# Patient Record
Sex: Male | Born: 1970 | Race: White | Hispanic: No | Marital: Single | Smoking: Current every day smoker
Health system: Southern US, Community
[De-identification: ages and names within clinical notes are randomized; demographics above are authoritative.]

## PROBLEM LIST (undated history)

## (undated) DIAGNOSIS — E119 Type 2 diabetes mellitus without complications: Secondary | ICD-10-CM

## (undated) DIAGNOSIS — I10 Essential (primary) hypertension: Secondary | ICD-10-CM

## (undated) DIAGNOSIS — J45909 Unspecified asthma, uncomplicated: Secondary | ICD-10-CM

## (undated) DIAGNOSIS — I4891 Unspecified atrial fibrillation: Secondary | ICD-10-CM

## (undated) DIAGNOSIS — Z9889 Other specified postprocedural states: Secondary | ICD-10-CM

## (undated) DIAGNOSIS — Z9289 Personal history of other medical treatment: Secondary | ICD-10-CM

## (undated) HISTORY — PX: LAPAROSCOPIC GASTRIC SLEEVE RESECTION: SHX5895

## (undated) HISTORY — PX: ABDOMINAL SURGERY: SHX537

---

## 1998-07-29 ENCOUNTER — Emergency Department (HOSPITAL_COMMUNITY): Admission: EM | Admit: 1998-07-29 | Discharge: 1998-07-29 | Payer: Self-pay

## 1999-11-24 ENCOUNTER — Ambulatory Visit (HOSPITAL_COMMUNITY): Admission: RE | Admit: 1999-11-24 | Discharge: 1999-11-24 | Payer: Self-pay | Admitting: Internal Medicine

## 1999-11-24 ENCOUNTER — Encounter: Payer: Self-pay | Admitting: Internal Medicine

## 2001-04-24 ENCOUNTER — Ambulatory Visit (HOSPITAL_BASED_OUTPATIENT_CLINIC_OR_DEPARTMENT_OTHER): Admission: RE | Admit: 2001-04-24 | Discharge: 2001-04-25 | Payer: Self-pay

## 2002-05-27 ENCOUNTER — Observation Stay (HOSPITAL_COMMUNITY): Admission: EM | Admit: 2002-05-27 | Discharge: 2002-05-28 | Payer: Self-pay | Admitting: Endocrinology

## 2002-05-28 ENCOUNTER — Encounter: Payer: Self-pay | Admitting: Endocrinology

## 2003-05-30 ENCOUNTER — Emergency Department (HOSPITAL_COMMUNITY): Admission: EM | Admit: 2003-05-30 | Discharge: 2003-05-30 | Payer: Self-pay | Admitting: Emergency Medicine

## 2003-10-31 ENCOUNTER — Inpatient Hospital Stay (HOSPITAL_COMMUNITY): Admission: EM | Admit: 2003-10-31 | Discharge: 2003-11-01 | Payer: Self-pay | Admitting: Emergency Medicine

## 2004-12-29 ENCOUNTER — Emergency Department (HOSPITAL_COMMUNITY): Admission: EM | Admit: 2004-12-29 | Discharge: 2004-12-30 | Payer: Self-pay | Admitting: Emergency Medicine

## 2006-02-04 ENCOUNTER — Emergency Department (HOSPITAL_COMMUNITY): Admission: EM | Admit: 2006-02-04 | Discharge: 2006-02-04 | Payer: Self-pay | Admitting: Emergency Medicine

## 2014-05-16 DIAGNOSIS — G4733 Obstructive sleep apnea (adult) (pediatric): Secondary | ICD-10-CM | POA: Diagnosis present

## 2015-05-21 DIAGNOSIS — Z9884 Bariatric surgery status: Secondary | ICD-10-CM | POA: Insufficient documentation

## 2015-07-15 DIAGNOSIS — J4520 Mild intermittent asthma, uncomplicated: Secondary | ICD-10-CM | POA: Diagnosis present

## 2019-09-14 DIAGNOSIS — I890 Lymphedema, not elsewhere classified: Secondary | ICD-10-CM | POA: Insufficient documentation

## 2020-04-09 DIAGNOSIS — I872 Venous insufficiency (chronic) (peripheral): Secondary | ICD-10-CM | POA: Insufficient documentation

## 2020-06-10 ENCOUNTER — Inpatient Hospital Stay (HOSPITAL_COMMUNITY): Payer: Medicaid Other

## 2020-06-10 ENCOUNTER — Inpatient Hospital Stay (HOSPITAL_COMMUNITY): Payer: Medicaid Other | Admitting: Anesthesiology

## 2020-06-10 ENCOUNTER — Emergency Department (HOSPITAL_COMMUNITY): Payer: Medicaid Other

## 2020-06-10 ENCOUNTER — Encounter (HOSPITAL_COMMUNITY): Payer: Self-pay

## 2020-06-10 ENCOUNTER — Inpatient Hospital Stay (HOSPITAL_COMMUNITY)
Admission: EM | Admit: 2020-06-10 | Discharge: 2020-08-06 | DRG: 003 | Disposition: A | Discharge status: Rehabilitation Facility | Payer: Medicaid Other | Attending: Family Medicine | Admitting: Family Medicine

## 2020-06-10 ENCOUNTER — Encounter (HOSPITAL_COMMUNITY)
Admission: EM | Disposition: A | Discharge status: Rehabilitation Facility | Payer: Self-pay | Source: Home / Self Care | Attending: Pulmonary Disease

## 2020-06-10 ENCOUNTER — Other Ambulatory Visit: Payer: Self-pay

## 2020-06-10 DIAGNOSIS — E8881 Metabolic syndrome: Secondary | ICD-10-CM | POA: Diagnosis present

## 2020-06-10 DIAGNOSIS — Z430 Encounter for attention to tracheostomy: Secondary | ICD-10-CM

## 2020-06-10 DIAGNOSIS — Z20822 Contact with and (suspected) exposure to covid-19: Secondary | ICD-10-CM | POA: Diagnosis present

## 2020-06-10 DIAGNOSIS — Y95 Nosocomial condition: Secondary | ICD-10-CM | POA: Diagnosis present

## 2020-06-10 DIAGNOSIS — Z79890 Hormone replacement therapy (postmenopausal): Secondary | ICD-10-CM

## 2020-06-10 DIAGNOSIS — G894 Chronic pain syndrome: Secondary | ICD-10-CM | POA: Diagnosis not present

## 2020-06-10 DIAGNOSIS — J189 Pneumonia, unspecified organism: Secondary | ICD-10-CM | POA: Diagnosis not present

## 2020-06-10 DIAGNOSIS — L0231 Cutaneous abscess of buttock: Secondary | ICD-10-CM | POA: Diagnosis present

## 2020-06-10 DIAGNOSIS — D6859 Other primary thrombophilia: Secondary | ICD-10-CM | POA: Diagnosis present

## 2020-06-10 DIAGNOSIS — A429 Actinomycosis, unspecified: Secondary | ICD-10-CM

## 2020-06-10 DIAGNOSIS — I878 Other specified disorders of veins: Secondary | ICD-10-CM | POA: Diagnosis present

## 2020-06-10 DIAGNOSIS — J9690 Respiratory failure, unspecified, unspecified whether with hypoxia or hypercapnia: Secondary | ICD-10-CM

## 2020-06-10 DIAGNOSIS — Z888 Allergy status to other drugs, medicaments and biological substances status: Secondary | ICD-10-CM

## 2020-06-10 DIAGNOSIS — L97409 Non-pressure chronic ulcer of unspecified heel and midfoot with unspecified severity: Secondary | ICD-10-CM | POA: Diagnosis present

## 2020-06-10 DIAGNOSIS — Z79899 Other long term (current) drug therapy: Secondary | ICD-10-CM

## 2020-06-10 DIAGNOSIS — E1110 Type 2 diabetes mellitus with ketoacidosis without coma: Secondary | ICD-10-CM | POA: Diagnosis present

## 2020-06-10 DIAGNOSIS — G8929 Other chronic pain: Secondary | ICD-10-CM | POA: Diagnosis not present

## 2020-06-10 DIAGNOSIS — I10 Essential (primary) hypertension: Secondary | ICD-10-CM | POA: Diagnosis present

## 2020-06-10 DIAGNOSIS — R531 Weakness: Secondary | ICD-10-CM | POA: Diagnosis present

## 2020-06-10 DIAGNOSIS — E08621 Diabetes mellitus due to underlying condition with foot ulcer: Secondary | ICD-10-CM | POA: Diagnosis not present

## 2020-06-10 DIAGNOSIS — N179 Acute kidney failure, unspecified: Secondary | ICD-10-CM | POA: Diagnosis present

## 2020-06-10 DIAGNOSIS — M2351 Chronic instability of knee, right knee: Secondary | ICD-10-CM | POA: Diagnosis not present

## 2020-06-10 DIAGNOSIS — R0902 Hypoxemia: Secondary | ICD-10-CM

## 2020-06-10 DIAGNOSIS — Z452 Encounter for adjustment and management of vascular access device: Secondary | ICD-10-CM

## 2020-06-10 DIAGNOSIS — K567 Ileus, unspecified: Secondary | ICD-10-CM | POA: Diagnosis not present

## 2020-06-10 DIAGNOSIS — X58XXXA Exposure to other specified factors, initial encounter: Secondary | ICD-10-CM | POA: Diagnosis not present

## 2020-06-10 DIAGNOSIS — N493 Fournier gangrene: Secondary | ICD-10-CM | POA: Diagnosis not present

## 2020-06-10 DIAGNOSIS — J9600 Acute respiratory failure, unspecified whether with hypoxia or hypercapnia: Secondary | ICD-10-CM

## 2020-06-10 DIAGNOSIS — E871 Hypo-osmolality and hyponatremia: Secondary | ICD-10-CM | POA: Diagnosis present

## 2020-06-10 DIAGNOSIS — Z6841 Body Mass Index (BMI) 40.0 and over, adult: Secondary | ICD-10-CM

## 2020-06-10 DIAGNOSIS — R109 Unspecified abdominal pain: Secondary | ICD-10-CM

## 2020-06-10 DIAGNOSIS — J9621 Acute and chronic respiratory failure with hypoxia: Secondary | ICD-10-CM | POA: Diagnosis present

## 2020-06-10 DIAGNOSIS — J9601 Acute respiratory failure with hypoxia: Secondary | ICD-10-CM | POA: Diagnosis not present

## 2020-06-10 DIAGNOSIS — E870 Hyperosmolality and hypernatremia: Secondary | ICD-10-CM | POA: Diagnosis not present

## 2020-06-10 DIAGNOSIS — E039 Hypothyroidism, unspecified: Secondary | ICD-10-CM | POA: Diagnosis not present

## 2020-06-10 DIAGNOSIS — Z23 Encounter for immunization: Secondary | ICD-10-CM | POA: Diagnosis not present

## 2020-06-10 DIAGNOSIS — E11628 Type 2 diabetes mellitus with other skin complications: Secondary | ICD-10-CM | POA: Diagnosis not present

## 2020-06-10 DIAGNOSIS — J9819 Other pulmonary collapse: Secondary | ICD-10-CM | POA: Diagnosis not present

## 2020-06-10 DIAGNOSIS — A401 Sepsis due to streptococcus, group B: Secondary | ICD-10-CM | POA: Diagnosis present

## 2020-06-10 DIAGNOSIS — Z885 Allergy status to narcotic agent status: Secondary | ICD-10-CM

## 2020-06-10 DIAGNOSIS — R6521 Severe sepsis with septic shock: Secondary | ICD-10-CM | POA: Diagnosis present

## 2020-06-10 DIAGNOSIS — E662 Morbid (severe) obesity with alveolar hypoventilation: Secondary | ICD-10-CM | POA: Diagnosis present

## 2020-06-10 DIAGNOSIS — K5641 Fecal impaction: Secondary | ICD-10-CM | POA: Diagnosis present

## 2020-06-10 DIAGNOSIS — T148XXA Other injury of unspecified body region, initial encounter: Secondary | ICD-10-CM | POA: Diagnosis not present

## 2020-06-10 DIAGNOSIS — B9620 Unspecified Escherichia coli [E. coli] as the cause of diseases classified elsewhere: Secondary | ICD-10-CM | POA: Diagnosis present

## 2020-06-10 DIAGNOSIS — I462 Cardiac arrest due to underlying cardiac condition: Secondary | ICD-10-CM | POA: Diagnosis not present

## 2020-06-10 DIAGNOSIS — T8143XA Infection following a procedure, organ and space surgical site, initial encounter: Secondary | ICD-10-CM

## 2020-06-10 DIAGNOSIS — T17890A Other foreign object in other parts of respiratory tract causing asphyxiation, initial encounter: Secondary | ICD-10-CM | POA: Diagnosis not present

## 2020-06-10 DIAGNOSIS — Z01818 Encounter for other preprocedural examination: Secondary | ICD-10-CM

## 2020-06-10 DIAGNOSIS — R7309 Other abnormal glucose: Secondary | ICD-10-CM | POA: Diagnosis not present

## 2020-06-10 DIAGNOSIS — I482 Chronic atrial fibrillation, unspecified: Secondary | ICD-10-CM | POA: Diagnosis present

## 2020-06-10 DIAGNOSIS — E876 Hypokalemia: Secondary | ICD-10-CM | POA: Diagnosis not present

## 2020-06-10 DIAGNOSIS — M726 Necrotizing fasciitis: Secondary | ICD-10-CM | POA: Diagnosis present

## 2020-06-10 DIAGNOSIS — R339 Retention of urine, unspecified: Secondary | ICD-10-CM | POA: Diagnosis not present

## 2020-06-10 DIAGNOSIS — S81811A Laceration without foreign body, right lower leg, initial encounter: Secondary | ICD-10-CM | POA: Diagnosis present

## 2020-06-10 DIAGNOSIS — N170 Acute kidney failure with tubular necrosis: Secondary | ICD-10-CM | POA: Diagnosis present

## 2020-06-10 DIAGNOSIS — D62 Acute posthemorrhagic anemia: Secondary | ICD-10-CM | POA: Diagnosis not present

## 2020-06-10 DIAGNOSIS — R5381 Other malaise: Secondary | ICD-10-CM | POA: Diagnosis not present

## 2020-06-10 DIAGNOSIS — J9809 Other diseases of bronchus, not elsewhere classified: Secondary | ICD-10-CM | POA: Diagnosis not present

## 2020-06-10 DIAGNOSIS — A419 Sepsis, unspecified organism: Secondary | ICD-10-CM

## 2020-06-10 DIAGNOSIS — I472 Ventricular tachycardia: Secondary | ICD-10-CM | POA: Diagnosis not present

## 2020-06-10 DIAGNOSIS — Z91030 Bee allergy status: Secondary | ICD-10-CM

## 2020-06-10 DIAGNOSIS — T502X5A Adverse effect of carbonic-anhydrase inhibitors, benzothiadiazides and other diuretics, initial encounter: Secondary | ICD-10-CM | POA: Diagnosis not present

## 2020-06-10 DIAGNOSIS — L97419 Non-pressure chronic ulcer of right heel and midfoot with unspecified severity: Secondary | ICD-10-CM | POA: Diagnosis present

## 2020-06-10 DIAGNOSIS — M533 Sacrococcygeal disorders, not elsewhere classified: Secondary | ICD-10-CM | POA: Diagnosis not present

## 2020-06-10 DIAGNOSIS — K5901 Slow transit constipation: Secondary | ICD-10-CM | POA: Diagnosis not present

## 2020-06-10 DIAGNOSIS — K7200 Acute and subacute hepatic failure without coma: Secondary | ICD-10-CM | POA: Diagnosis present

## 2020-06-10 DIAGNOSIS — E8770 Fluid overload, unspecified: Secondary | ICD-10-CM | POA: Diagnosis not present

## 2020-06-10 DIAGNOSIS — J159 Unspecified bacterial pneumonia: Secondary | ICD-10-CM | POA: Diagnosis present

## 2020-06-10 DIAGNOSIS — Z9884 Bariatric surgery status: Secondary | ICD-10-CM

## 2020-06-10 DIAGNOSIS — L97529 Non-pressure chronic ulcer of other part of left foot with unspecified severity: Secondary | ICD-10-CM | POA: Diagnosis present

## 2020-06-10 DIAGNOSIS — J9602 Acute respiratory failure with hypercapnia: Secondary | ICD-10-CM | POA: Diagnosis not present

## 2020-06-10 DIAGNOSIS — Y838 Other surgical procedures as the cause of abnormal reaction of the patient, or of later complication, without mention of misadventure at the time of the procedure: Secondary | ICD-10-CM | POA: Diagnosis not present

## 2020-06-10 DIAGNOSIS — G928 Other toxic encephalopathy: Secondary | ICD-10-CM | POA: Diagnosis present

## 2020-06-10 DIAGNOSIS — S81812A Laceration without foreign body, left lower leg, initial encounter: Secondary | ICD-10-CM | POA: Diagnosis present

## 2020-06-10 DIAGNOSIS — T17500A Unspecified foreign body in bronchus causing asphyxiation, initial encounter: Secondary | ICD-10-CM | POA: Diagnosis not present

## 2020-06-10 DIAGNOSIS — Z880 Allergy status to penicillin: Secondary | ICD-10-CM

## 2020-06-10 DIAGNOSIS — M62838 Other muscle spasm: Secondary | ICD-10-CM | POA: Diagnosis not present

## 2020-06-10 DIAGNOSIS — R1310 Dysphagia, unspecified: Secondary | ICD-10-CM | POA: Diagnosis present

## 2020-06-10 DIAGNOSIS — Z8719 Personal history of other diseases of the digestive system: Secondary | ICD-10-CM | POA: Diagnosis not present

## 2020-06-10 DIAGNOSIS — E785 Hyperlipidemia, unspecified: Secondary | ICD-10-CM | POA: Diagnosis present

## 2020-06-10 DIAGNOSIS — Z930 Tracheostomy status: Secondary | ICD-10-CM | POA: Diagnosis not present

## 2020-06-10 DIAGNOSIS — R739 Hyperglycemia, unspecified: Secondary | ICD-10-CM

## 2020-06-10 DIAGNOSIS — T402X5A Adverse effect of other opioids, initial encounter: Secondary | ICD-10-CM | POA: Diagnosis present

## 2020-06-10 DIAGNOSIS — Z4659 Encounter for fitting and adjustment of other gastrointestinal appliance and device: Secondary | ICD-10-CM

## 2020-06-10 DIAGNOSIS — J45909 Unspecified asthma, uncomplicated: Secondary | ICD-10-CM | POA: Diagnosis present

## 2020-06-10 DIAGNOSIS — I480 Paroxysmal atrial fibrillation: Secondary | ICD-10-CM | POA: Diagnosis present

## 2020-06-10 DIAGNOSIS — J9503 Malfunction of tracheostomy stoma: Secondary | ICD-10-CM | POA: Diagnosis not present

## 2020-06-10 DIAGNOSIS — E1142 Type 2 diabetes mellitus with diabetic polyneuropathy: Secondary | ICD-10-CM | POA: Diagnosis present

## 2020-06-10 DIAGNOSIS — D6489 Other specified anemias: Secondary | ICD-10-CM | POA: Diagnosis present

## 2020-06-10 DIAGNOSIS — E11621 Type 2 diabetes mellitus with foot ulcer: Secondary | ICD-10-CM | POA: Diagnosis present

## 2020-06-10 DIAGNOSIS — Z781 Physical restraint status: Secondary | ICD-10-CM

## 2020-06-10 DIAGNOSIS — K5900 Constipation, unspecified: Secondary | ICD-10-CM

## 2020-06-10 DIAGNOSIS — L03317 Cellulitis of buttock: Secondary | ICD-10-CM | POA: Diagnosis present

## 2020-06-10 DIAGNOSIS — F17210 Nicotine dependence, cigarettes, uncomplicated: Secondary | ICD-10-CM | POA: Diagnosis present

## 2020-06-10 DIAGNOSIS — Z7901 Long term (current) use of anticoagulants: Secondary | ICD-10-CM

## 2020-06-10 DIAGNOSIS — J9622 Acute and chronic respiratory failure with hypercapnia: Secondary | ICD-10-CM | POA: Diagnosis present

## 2020-06-10 DIAGNOSIS — Z7984 Long term (current) use of oral hypoglycemic drugs: Secondary | ICD-10-CM

## 2020-06-10 DIAGNOSIS — Z0189 Encounter for other specified special examinations: Secondary | ICD-10-CM

## 2020-06-10 DIAGNOSIS — Z9911 Dependence on respirator [ventilator] status: Secondary | ICD-10-CM | POA: Diagnosis not present

## 2020-06-10 DIAGNOSIS — Z833 Family history of diabetes mellitus: Secondary | ICD-10-CM

## 2020-06-10 HISTORY — DX: Other specified postprocedural states: Z98.890

## 2020-06-10 HISTORY — DX: Essential (primary) hypertension: I10

## 2020-06-10 HISTORY — PX: IRRIGATION AND DEBRIDEMENT ABSCESS: SHX5252

## 2020-06-10 HISTORY — DX: Type 2 diabetes mellitus without complications: E11.9

## 2020-06-10 HISTORY — DX: Unspecified asthma, uncomplicated: J45.909

## 2020-06-10 HISTORY — DX: Personal history of other medical treatment: Z92.89

## 2020-06-10 HISTORY — DX: Fournier gangrene: N49.3

## 2020-06-10 HISTORY — DX: Unspecified atrial fibrillation: I48.91

## 2020-06-10 LAB — COMPREHENSIVE METABOLIC PANEL
ALT: 23 U/L (ref 0–44)
AST: 25 U/L (ref 15–41)
Albumin: 2 g/dL — ABNORMAL LOW (ref 3.5–5.0)
Alkaline Phosphatase: 87 U/L (ref 38–126)
Anion gap: 16 — ABNORMAL HIGH (ref 5–15)
BUN: 36 mg/dL — ABNORMAL HIGH (ref 6–20)
CO2: 18 mmol/L — ABNORMAL LOW (ref 22–32)
Calcium: 8.4 mg/dL — ABNORMAL LOW (ref 8.9–10.3)
Chloride: 94 mmol/L — ABNORMAL LOW (ref 98–111)
Creatinine, Ser: 3.56 mg/dL — ABNORMAL HIGH (ref 0.61–1.24)
GFR, Estimated: 20 mL/min — ABNORMAL LOW (ref >60)
Glucose, Bld: 362 mg/dL — ABNORMAL HIGH (ref 70–99)
Potassium: 4.3 mmol/L (ref 3.5–5.1)
Sodium: 128 mmol/L — ABNORMAL LOW (ref 135–145)
Total Bilirubin: 1.3 mg/dL — ABNORMAL HIGH (ref 0.3–1.2)
Total Protein: 6.3 g/dL — ABNORMAL LOW (ref 6.5–8.1)

## 2020-06-10 LAB — POCT I-STAT 7, (LYTES, BLD GAS, ICA,H+H)
Acid-base deficit: 11 mmol/L — ABNORMAL HIGH (ref 0.0–2.0)
Acid-base deficit: 8 mmol/L — ABNORMAL HIGH (ref 0.0–2.0)
Acid-base deficit: 12 mmol/L — ABNORMAL HIGH (ref 0.0–2.0)
Bicarbonate: 18.8 mmol/L — ABNORMAL LOW (ref 20.0–28.0)
Bicarbonate: 21.6 mmol/L (ref 20.0–28.0)
Bicarbonate: 16.6 mmol/L — ABNORMAL LOW (ref 20.0–28.0)
Calcium, Ion: 1.11 mmol/L — ABNORMAL LOW (ref 1.15–1.40)
Calcium, Ion: 1.05 mmol/L — ABNORMAL LOW (ref 1.15–1.40)
Calcium, Ion: 1.03 mmol/L — ABNORMAL LOW (ref 1.15–1.40)
HCT: 33 % — ABNORMAL LOW (ref 39.0–52.0)
HCT: 36 % — ABNORMAL LOW (ref 39.0–52.0)
HCT: 48 % (ref 39.0–52.0)
Hemoglobin: 11.2 g/dL — ABNORMAL LOW (ref 13.0–17.0)
Hemoglobin: 12.2 g/dL — ABNORMAL LOW (ref 13.0–17.0)
Hemoglobin: 16.3 g/dL (ref 13.0–17.0)
O2 Saturation: 79 %
O2 Saturation: 92 %
O2 Saturation: 94 %
Patient temperature: 98.6
Patient temperature: 100.1
Potassium: 5.4 mmol/L — ABNORMAL HIGH (ref 3.5–5.1)
Potassium: 5.6 mmol/L — ABNORMAL HIGH (ref 3.5–5.1)
Potassium: 6 mmol/L — ABNORMAL HIGH (ref 3.5–5.1)
Sodium: 127 mmol/L — ABNORMAL LOW (ref 135–145)
Sodium: 131 mmol/L — ABNORMAL LOW (ref 135–145)
Sodium: 129 mmol/L — ABNORMAL LOW (ref 135–145)
TCO2: 20 mmol/L — ABNORMAL LOW (ref 22–32)
TCO2: 24 mmol/L (ref 22–32)
TCO2: 18 mmol/L — ABNORMAL LOW (ref 22–32)
pCO2 arterial: 57.6 mmHg — ABNORMAL HIGH (ref 32.0–48.0)
pCO2 arterial: 66 mmHg (ref 32.0–48.0)
pCO2 arterial: 49.4 mmHg — ABNORMAL HIGH (ref 32.0–48.0)
pH, Arterial: 7.121 — CL (ref 7.350–7.450)
pH, Arterial: 7.124 — CL (ref 7.350–7.450)
pH, Arterial: 7.14 — CL (ref 7.350–7.450)
pO2, Arterial: 58 mmHg — ABNORMAL LOW (ref 83.0–108.0)
pO2, Arterial: 85 mmHg (ref 83.0–108.0)
pO2, Arterial: 98 mmHg (ref 83.0–108.0)

## 2020-06-10 LAB — RESP PANEL BY RT-PCR (FLU A&B, COVID) ARPGX2
Influenza A by PCR: NEGATIVE
Influenza B by PCR: NEGATIVE
SARS Coronavirus 2 by RT PCR: NEGATIVE

## 2020-06-10 LAB — CBC WITH DIFFERENTIAL/PLATELET
Abs Immature Granulocytes: 0.16 10*3/uL — ABNORMAL HIGH (ref 0.00–0.07)
Basophils Absolute: 0.1 10*3/uL (ref 0.0–0.1)
Basophils Relative: 0 %
Eosinophils Absolute: 0.2 10*3/uL (ref 0.0–0.5)
Eosinophils Relative: 1 %
HCT: 38.5 % — ABNORMAL LOW (ref 39.0–52.0)
Hemoglobin: 12.3 g/dL — ABNORMAL LOW (ref 13.0–17.0)
Immature Granulocytes: 1 %
Lymphocytes Relative: 5 %
Lymphs Abs: 0.8 10*3/uL (ref 0.7–4.0)
MCH: 28.2 pg (ref 26.0–34.0)
MCHC: 31.9 g/dL (ref 30.0–36.0)
MCV: 88.3 fL (ref 80.0–100.0)
Monocytes Absolute: 1 10*3/uL (ref 0.1–1.0)
Monocytes Relative: 6 %
Neutro Abs: 15.4 10*3/uL — ABNORMAL HIGH (ref 1.7–7.7)
Neutrophils Relative %: 87 %
Platelets: 199 10*3/uL (ref 150–400)
RBC: 4.36 MIL/uL (ref 4.22–5.81)
RDW: 14.6 % (ref 11.5–15.5)
WBC: 17.6 10*3/uL — ABNORMAL HIGH (ref 4.0–10.5)
nRBC: 0 % (ref 0.0–0.2)

## 2020-06-10 LAB — HIV ANTIBODY (ROUTINE TESTING W REFLEX): HIV Screen 4th Generation wRfx: NONREACTIVE

## 2020-06-10 LAB — BASIC METABOLIC PANEL
Anion gap: 14 (ref 5–15)
Anion gap: 18 — ABNORMAL HIGH (ref 5–15)
BUN: 38 mg/dL — ABNORMAL HIGH (ref 6–20)
BUN: 38 mg/dL — ABNORMAL HIGH (ref 6–20)
CO2: 19 mmol/L — ABNORMAL LOW (ref 22–32)
CO2: 15 mmol/L — ABNORMAL LOW (ref 22–32)
Calcium: 8 mg/dL — ABNORMAL LOW (ref 8.9–10.3)
Calcium: 7.7 mg/dL — ABNORMAL LOW (ref 8.9–10.3)
Chloride: 94 mmol/L — ABNORMAL LOW (ref 98–111)
Chloride: 97 mmol/L — ABNORMAL LOW (ref 98–111)
Creatinine, Ser: 3.71 mg/dL — ABNORMAL HIGH (ref 0.61–1.24)
Creatinine, Ser: 4 mg/dL — ABNORMAL HIGH (ref 0.61–1.24)
GFR, Estimated: 19 mL/min — ABNORMAL LOW (ref >60)
GFR, Estimated: 18 mL/min — ABNORMAL LOW (ref >60)
Glucose, Bld: 352 mg/dL — ABNORMAL HIGH (ref 70–99)
Glucose, Bld: 506 mg/dL (ref 70–99)
Potassium: 5.2 mmol/L — ABNORMAL HIGH (ref 3.5–5.1)
Potassium: 6.1 mmol/L — ABNORMAL HIGH (ref 3.5–5.1)
Sodium: 127 mmol/L — ABNORMAL LOW (ref 135–145)
Sodium: 130 mmol/L — ABNORMAL LOW (ref 135–145)

## 2020-06-10 LAB — URINALYSIS, ROUTINE W REFLEX MICROSCOPIC
Bilirubin Urine: NEGATIVE
Glucose, UA: >=500 mg/dL — AB
Ketones, ur: NEGATIVE mg/dL
Leukocytes,Ua: NEGATIVE
Nitrite: NEGATIVE
Protein, ur: 100 mg/dL — AB
RBC / HPF: >50 RBC/hpf — ABNORMAL HIGH (ref 0–5)
Specific Gravity, Urine: 1.015 (ref 1.005–1.030)
pH: 5 (ref 5.0–8.0)

## 2020-06-10 LAB — TSH: TSH: 4.965 u[IU]/mL — ABNORMAL HIGH (ref 0.350–4.500)

## 2020-06-10 LAB — PROCALCITONIN: Procalcitonin: 3.1 ng/mL

## 2020-06-10 LAB — BETA-HYDROXYBUTYRIC ACID: Beta-Hydroxybutyric Acid: 0.95 mmol/L — ABNORMAL HIGH (ref 0.05–0.27)

## 2020-06-10 LAB — SARS CORONAVIRUS 2 (TAT 6-24 HRS): SARS Coronavirus 2: NEGATIVE

## 2020-06-10 LAB — GLUCOSE, CAPILLARY
Glucose-Capillary: 278 mg/dL — ABNORMAL HIGH (ref 70–99)
Glucose-Capillary: 479 mg/dL — ABNORMAL HIGH (ref 70–99)
Glucose-Capillary: 450 mg/dL — ABNORMAL HIGH (ref 70–99)
Glucose-Capillary: 450 mg/dL — ABNORMAL HIGH (ref 70–99)
Glucose-Capillary: 414 mg/dL — ABNORMAL HIGH (ref 70–99)

## 2020-06-10 LAB — HEMOGLOBIN A1C
Hgb A1c MFr Bld: 9.6 % — ABNORMAL HIGH (ref 4.8–5.6)
Mean Plasma Glucose: 228.82 mg/dL

## 2020-06-10 LAB — LACTIC ACID, PLASMA
Lactic Acid, Venous: 3.7 mmol/L (ref 0.5–1.9)
Lactic Acid, Venous: 2.2 mmol/L (ref 0.5–1.9)

## 2020-06-10 LAB — CBG MONITORING, ED
Glucose-Capillary: 353 mg/dL — ABNORMAL HIGH (ref 70–99)
Glucose-Capillary: 260 mg/dL — ABNORMAL HIGH (ref 70–99)

## 2020-06-10 LAB — MAGNESIUM: Magnesium: 1.8 mg/dL (ref 1.7–2.4)

## 2020-06-10 SURGERY — IRRIGATION AND DEBRIDEMENT ABSCESS
Anesthesia: General

## 2020-06-10 MED ORDER — VANCOMYCIN HCL IN DEXTROSE 1-5 GM/200ML-% IV SOLN: 1000.0000 mg | Freq: Once | INTRAVENOUS | Status: DC

## 2020-06-10 MED ORDER — SODIUM BICARBONATE 8.4 % IV SOLN
INTRAVENOUS | Status: AC
  Filled 2020-06-10: qty 100

## 2020-06-10 MED ORDER — HYDROCORTISONE NA SUCCINATE PF 100 MG IJ SOLR
50.0000 mg | Freq: Four times a day (QID) | INTRAMUSCULAR | Status: DC
  Administered 2020-06-10 – 2020-06-11 (×3): 50 mg via INTRAVENOUS
  Filled 2020-06-10 (×3): qty 2

## 2020-06-10 MED ORDER — ALBUMIN HUMAN 5 % IV SOLN: INTRAVENOUS | Status: DC | PRN

## 2020-06-10 MED ORDER — LEVOTHYROXINE SODIUM 25 MCG PO TABS: 75.0000 ug | ORAL_TABLET | Freq: Every day | ORAL | Status: DC

## 2020-06-10 MED ORDER — ORAL CARE MOUTH RINSE: 15.0000 mL | Freq: Once | OROMUCOSAL | Status: DC

## 2020-06-10 MED ORDER — ONDANSETRON HCL 4 MG/2ML IJ SOLN
INTRAMUSCULAR | Status: DC | PRN
  Administered 2020-06-10: 4 mg via INTRAVENOUS

## 2020-06-10 MED ORDER — MIDAZOLAM HCL 2 MG/2ML IJ SOLN
INTRAMUSCULAR | Status: AC
  Filled 2020-06-10: qty 2

## 2020-06-10 MED ORDER — VASOPRESSIN 20 UNITS/100 ML INFUSION FOR SHOCK
0.0000 [IU]/min | INTRAVENOUS | Status: DC
  Administered 2020-06-10 – 2020-06-17 (×16): 0.03 [IU]/min via INTRAVENOUS
  Administered 2020-06-17 – 2020-06-19 (×6): 0.04 [IU]/min via INTRAVENOUS
  Administered 2020-06-20: 0.03 [IU]/min via INTRAVENOUS
  Administered 2020-06-20 (×2): 0.04 [IU]/min via INTRAVENOUS
  Administered 2020-06-21: 0.03 [IU]/min via INTRAVENOUS
  Filled 2020-06-10 (×26): qty 100

## 2020-06-10 MED ORDER — MORPHINE SULFATE (PF) 2 MG/ML IV SOLN: 2.0000 mg | INTRAVENOUS | Status: DC | PRN

## 2020-06-10 MED ORDER — PROPOFOL 1000 MG/100ML IV EMUL
0.0000 ug/kg/min | INTRAVENOUS | Status: DC
  Administered 2020-06-10: 35 ug/kg/min via INTRAVENOUS
  Administered 2020-06-16 – 2020-06-17 (×10): 30 ug/kg/min via INTRAVENOUS
  Administered 2020-06-17: 25 ug/kg/min via INTRAVENOUS
  Administered 2020-06-17 – 2020-06-18 (×9): 30 ug/kg/min via INTRAVENOUS
  Administered 2020-06-18: 40 ug/kg/min via INTRAVENOUS
  Administered 2020-06-18 (×2): 30 ug/kg/min via INTRAVENOUS
  Administered 2020-06-18: 40 ug/kg/min via INTRAVENOUS
  Administered 2020-06-18 (×2): 30 ug/kg/min via INTRAVENOUS
  Administered 2020-06-18: 40 ug/kg/min via INTRAVENOUS
  Administered 2020-06-18 – 2020-06-19 (×2): 30 ug/kg/min via INTRAVENOUS
  Administered 2020-06-19 (×3): 35 ug/kg/min via INTRAVENOUS
  Administered 2020-06-19: 40 ug/kg/min via INTRAVENOUS
  Administered 2020-06-19: 35 ug/kg/min via INTRAVENOUS
  Administered 2020-06-19: 40 ug/kg/min via INTRAVENOUS
  Administered 2020-06-19 (×2): 30 ug/kg/min via INTRAVENOUS
  Administered 2020-06-19: 40 ug/kg/min via INTRAVENOUS
  Administered 2020-06-19: 30 ug/kg/min via INTRAVENOUS
  Administered 2020-06-20 (×5): 35 ug/kg/min via INTRAVENOUS
  Filled 2020-06-10: qty 100
  Filled 2020-06-10: qty 300
  Filled 2020-06-10 (×25): qty 100
  Filled 2020-06-10: qty 300
  Filled 2020-06-10: qty 200
  Filled 2020-06-10 (×10): qty 100
  Filled 2020-06-10: qty 200
  Filled 2020-06-10 (×5): qty 100

## 2020-06-10 MED ORDER — PHENYLEPHRINE CONCENTRATED 100MG/250ML (0.4 MG/ML) INFUSION SIMPLE
0.0000 ug/min | INTRAVENOUS | Status: DC
  Filled 2020-06-10: qty 250

## 2020-06-10 MED ORDER — CHLORHEXIDINE GLUCONATE 0.12% ORAL RINSE (MEDLINE KIT)
15.0000 mL | Freq: Two times a day (BID) | OROMUCOSAL | Status: DC
Start: 2020-06-10 — End: 2020-07-06
  Administered 2020-06-10 – 2020-07-06 (×52): 15 mL via OROMUCOSAL

## 2020-06-10 MED ORDER — LIDOCAINE 2% (20 MG/ML) 5 ML SYRINGE
INTRAMUSCULAR | Status: AC
  Filled 2020-06-10: qty 5

## 2020-06-10 MED ORDER — DEXTROSE 50 % IV SOLN: 0.0000 mL | INTRAVENOUS | Status: DC | PRN

## 2020-06-10 MED ORDER — DEXTROSE IN LACTATED RINGERS 5 % IV SOLN: INTRAVENOUS | Status: DC

## 2020-06-10 MED ORDER — PHENYLEPHRINE HCL-NACL 20-0.9 MG/250ML-% IV SOLN
0.0000 ug/min | INTRAVENOUS | Status: DC
  Administered 2020-06-10: 350 ug/min via INTRAVENOUS
  Filled 2020-06-10 (×2): qty 250

## 2020-06-10 MED ORDER — AMIODARONE HCL 200 MG PO TABS: 200.0000 mg | ORAL_TABLET | Freq: Two times a day (BID) | ORAL | Status: DC

## 2020-06-10 MED ORDER — LACTATED RINGERS IV SOLN: INTRAVENOUS | Status: DC

## 2020-06-10 MED ORDER — LACTATED RINGERS IV BOLUS (SEPSIS)
3000.0000 mL | Freq: Once | INTRAVENOUS | Status: AC
  Administered 2020-06-10: 3000 mL via INTRAVENOUS

## 2020-06-10 MED ORDER — LACTATED RINGERS IV SOLN: INTRAVENOUS | Status: DC | PRN

## 2020-06-10 MED ORDER — ATORVASTATIN CALCIUM 10 MG PO TABS: 20.0000 mg | ORAL_TABLET | Freq: Every evening | ORAL | Status: DC

## 2020-06-10 MED ORDER — LEVOTHYROXINE SODIUM 75 MCG PO TABS
75.0000 ug | ORAL_TABLET | Freq: Every day | ORAL | Status: DC
  Administered 2020-06-11 – 2020-07-12 (×31): 75 ug
  Filled 2020-06-10: qty 1
  Filled 2020-06-10 (×2): qty 3
  Filled 2020-06-10: qty 1
  Filled 2020-06-10 (×7): qty 3
  Filled 2020-06-10: qty 1
  Filled 2020-06-10 (×19): qty 3

## 2020-06-10 MED ORDER — METRONIDAZOLE 500 MG PO TABS
500.0000 mg | ORAL_TABLET | Freq: Three times a day (TID) | ORAL | Status: DC
  Administered 2020-06-10: 500 mg via ORAL
  Filled 2020-06-10: qty 1

## 2020-06-10 MED ORDER — INSULIN ASPART 100 UNIT/ML ~~LOC~~ SOLN: 0.0000 [IU] | SUBCUTANEOUS | Status: DC

## 2020-06-10 MED ORDER — ETOMIDATE 2 MG/ML IV SOLN
INTRAVENOUS | Status: DC | PRN
  Administered 2020-06-10: 20 mg via INTRAVENOUS

## 2020-06-10 MED ORDER — PANTOPRAZOLE SODIUM 40 MG IV SOLR
40.0000 mg | INTRAVENOUS | Status: DC
  Administered 2020-06-10 – 2020-06-17 (×7): 40 mg via INTRAVENOUS
  Filled 2020-06-10 (×8): qty 40

## 2020-06-10 MED ORDER — POLYETHYLENE GLYCOL 3350 17 G PO PACK
17.0000 g | PACK | Freq: Every day | ORAL | Status: DC
Start: 2020-06-10 — End: 2020-06-10

## 2020-06-10 MED ORDER — ACETAMINOPHEN 325 MG PO TABS: 650.0000 mg | ORAL_TABLET | Freq: Four times a day (QID) | ORAL | Status: DC | PRN

## 2020-06-10 MED ORDER — SODIUM CHLORIDE 0.9 % IV BOLUS: 1000.0000 mL | Freq: Once | INTRAVENOUS | Status: DC

## 2020-06-10 MED ORDER — ATORVASTATIN CALCIUM 10 MG PO TABS
20.0000 mg | ORAL_TABLET | Freq: Every evening | ORAL | Status: DC
  Administered 2020-06-10 – 2020-07-11 (×29): 20 mg
  Filled 2020-06-10 (×32): qty 2

## 2020-06-10 MED ORDER — PROPOFOL 10 MG/ML IV BOLUS
INTRAVENOUS | Status: AC
  Filled 2020-06-10: qty 20

## 2020-06-10 MED ORDER — SODIUM CHLORIDE 0.9 % IV SOLN
2.0000 g | Freq: Once | INTRAVENOUS | Status: AC
  Administered 2020-06-10: 2 g via INTRAVENOUS
  Filled 2020-06-10: qty 2

## 2020-06-10 MED ORDER — PHENYLEPHRINE 40 MCG/ML (10ML) SYRINGE FOR IV PUSH (FOR BLOOD PRESSURE SUPPORT)
PREFILLED_SYRINGE | INTRAVENOUS | Status: DC | PRN
  Administered 2020-06-10: 120 ug via INTRAVENOUS
  Administered 2020-06-10: 80 ug via INTRAVENOUS
  Administered 2020-06-10: 120 ug via INTRAVENOUS
  Administered 2020-06-10: 80 ug via INTRAVENOUS

## 2020-06-10 MED ORDER — FENTANYL CITRATE (PF) 250 MCG/5ML IJ SOLN
INTRAMUSCULAR | Status: AC
  Filled 2020-06-10: qty 5

## 2020-06-10 MED ORDER — INSULIN REGULAR(HUMAN) IN NACL 100-0.9 UT/100ML-% IV SOLN
INTRAVENOUS | Status: AC
  Administered 2020-06-10: 19:00:00 14 [IU]/h via INTRAVENOUS
  Administered 2020-06-10: 23 [IU]/h via INTRAVENOUS
  Administered 2020-06-11: 23:00:00 22 [IU]/h via INTRAVENOUS
  Administered 2020-06-11: 28 [IU]/h via INTRAVENOUS
  Administered 2020-06-11: 30 [IU]/h via INTRAVENOUS
  Administered 2020-06-11: 11:00:00 17 [IU]/h via INTRAVENOUS
  Administered 2020-06-11: 01:00:00 30 [IU]/h via INTRAVENOUS
  Administered 2020-06-11: 07:00:00 25 [IU]/h via INTRAVENOUS
  Administered 2020-06-11: 30 [IU]/h via INTRAVENOUS
  Administered 2020-06-12: 13 [IU]/h via INTRAVENOUS
  Administered 2020-06-12: 17:00:00 5.5 [IU]/h via INTRAVENOUS
  Administered 2020-06-13: 11 [IU]/h via INTRAVENOUS
  Filled 2020-06-10 (×10): qty 100

## 2020-06-10 MED ORDER — EZETIMIBE 10 MG PO TABS
10.0000 mg | ORAL_TABLET | Freq: Every day | ORAL | Status: DC
Start: 2020-06-10 — End: 2020-06-10
  Filled 2020-06-10: qty 1

## 2020-06-10 MED ORDER — SUCCINYLCHOLINE CHLORIDE 200 MG/10ML IV SOSY
PREFILLED_SYRINGE | INTRAVENOUS | Status: DC | PRN
  Administered 2020-06-10: 270 mg via INTRAVENOUS

## 2020-06-10 MED ORDER — PHENYLEPHRINE HCL-NACL 10-0.9 MG/250ML-% IV SOLN
0.0000 ug/min | INTRAVENOUS | Status: DC
  Administered 2020-06-10: 350 ug/min via INTRAVENOUS
  Filled 2020-06-10 (×4): qty 250

## 2020-06-10 MED ORDER — CLINDAMYCIN PHOSPHATE 900 MG/50ML IV SOLN
900.0000 mg | INTRAVENOUS | Status: AC
  Administered 2020-06-10: 900 mg via INTRAVENOUS
  Filled 2020-06-10: qty 50

## 2020-06-10 MED ORDER — ETOMIDATE 2 MG/ML IV SOLN
INTRAVENOUS | Status: AC
  Filled 2020-06-10: qty 10

## 2020-06-10 MED ORDER — FENTANYL CITRATE (PF) 100 MCG/2ML IJ SOLN
50.0000 ug | INTRAMUSCULAR | Status: DC | PRN
  Administered 2020-06-10 – 2020-06-12 (×2): 100 ug via INTRAVENOUS
  Administered 2020-06-12 (×2): 200 ug via INTRAVENOUS
  Administered 2020-06-12: 100 ug via INTRAVENOUS
  Administered 2020-06-14 (×3): 200 ug via INTRAVENOUS
  Administered 2020-06-14: 100 ug via INTRAVENOUS
  Administered 2020-06-15 – 2020-06-17 (×7): 200 ug via INTRAVENOUS
  Administered 2020-06-17: 100 ug via INTRAVENOUS
  Administered 2020-06-17 – 2020-06-18 (×2): 200 ug via INTRAVENOUS
  Administered 2020-06-19 – 2020-06-24 (×5): 100 ug via INTRAVENOUS
  Administered 2020-06-26: 200 ug via INTRAVENOUS
  Administered 2020-06-26 – 2020-06-27 (×2): 100 ug via INTRAVENOUS
  Administered 2020-06-27: 200 ug via INTRAVENOUS
  Administered 2020-06-28 (×3): 100 ug via INTRAVENOUS
  Administered 2020-06-28: 200 ug via INTRAVENOUS
  Administered 2020-06-28: 100 ug via INTRAVENOUS
  Administered 2020-06-29 (×2): 200 ug via INTRAVENOUS
  Administered 2020-06-29: 100 ug via INTRAVENOUS
  Administered 2020-06-30 (×2): 200 ug via INTRAVENOUS
  Administered 2020-06-30 – 2020-07-01 (×3): 100 ug via INTRAVENOUS
  Administered 2020-07-01 (×3): 200 ug via INTRAVENOUS
  Administered 2020-07-01: 100 ug via INTRAVENOUS
  Filled 2020-06-10 (×2): qty 2
  Filled 2020-06-10: qty 4
  Filled 2020-06-10: qty 2
  Filled 2020-06-10: qty 4
  Filled 2020-06-10: qty 2
  Filled 2020-06-10: qty 4
  Filled 2020-06-10: qty 2
  Filled 2020-06-10 (×2): qty 4
  Filled 2020-06-10 (×2): qty 2
  Filled 2020-06-10: qty 4
  Filled 2020-06-10: qty 2
  Filled 2020-06-10 (×2): qty 4
  Filled 2020-06-10 (×3): qty 2
  Filled 2020-06-10: qty 4
  Filled 2020-06-10: qty 2
  Filled 2020-06-10: qty 4
  Filled 2020-06-10 (×2): qty 2

## 2020-06-10 MED ORDER — ROCURONIUM BROMIDE 10 MG/ML (PF) SYRINGE
PREFILLED_SYRINGE | INTRAVENOUS | Status: AC
  Filled 2020-06-10: qty 10

## 2020-06-10 MED ORDER — ONDANSETRON HCL 4 MG PO TABS: 4.0000 mg | ORAL_TABLET | Freq: Four times a day (QID) | ORAL | Status: DC | PRN

## 2020-06-10 MED ORDER — VASOPRESSIN 20 UNIT/ML IV SOLN
INTRAVENOUS | Status: AC
  Filled 2020-06-10: qty 1

## 2020-06-10 MED ORDER — SODIUM BICARBONATE 8.4 % IV SOLN
INTRAVENOUS | Status: AC
  Filled 2020-06-10: qty 50

## 2020-06-10 MED ORDER — METRONIDAZOLE IN NACL 5-0.79 MG/ML-% IV SOLN: 500.0000 mg | Freq: Three times a day (TID) | INTRAVENOUS | Status: DC

## 2020-06-10 MED ORDER — SODIUM CHLORIDE 0.9 % IV BOLUS
1000.0000 mL | Freq: Once | INTRAVENOUS | Status: AC
  Administered 2020-06-10: 1000 mL via INTRAVENOUS

## 2020-06-10 MED ORDER — SODIUM CHLORIDE (PF) 0.9 % IJ SOLN
INTRAMUSCULAR | Status: AC
  Filled 2020-06-10: qty 20

## 2020-06-10 MED ORDER — SODIUM CHLORIDE 0.9 % IV SOLN
2.0000 g | Freq: Two times a day (BID) | INTRAVENOUS | Status: DC
  Administered 2020-06-10 – 2020-06-13 (×5): 2 g via INTRAVENOUS
  Filled 2020-06-10 (×5): qty 2

## 2020-06-10 MED ORDER — PROPOFOL 500 MG/50ML IV EMUL
INTRAVENOUS | Status: DC | PRN
  Administered 2020-06-10: 125 ug/kg/min via INTRAVENOUS

## 2020-06-10 MED ORDER — PHENYLEPHRINE HCL-NACL 10-0.9 MG/250ML-% IV SOLN
INTRAVENOUS | Status: DC | PRN
  Administered 2020-06-10: 50 ug/min via INTRAVENOUS

## 2020-06-10 MED ORDER — ROCURONIUM BROMIDE 10 MG/ML (PF) SYRINGE
PREFILLED_SYRINGE | INTRAVENOUS | Status: DC | PRN
  Administered 2020-06-10: 50 mg via INTRAVENOUS

## 2020-06-10 MED ORDER — FENTANYL CITRATE (PF) 250 MCG/5ML IJ SOLN
INTRAMUSCULAR | Status: DC | PRN
  Administered 2020-06-10: 50 ug via INTRAVENOUS
  Administered 2020-06-10: 100 ug via INTRAVENOUS

## 2020-06-10 MED ORDER — DEXAMETHASONE SODIUM PHOSPHATE 10 MG/ML IJ SOLN
INTRAMUSCULAR | Status: DC | PRN
  Administered 2020-06-10: 5 mg via INTRAVENOUS

## 2020-06-10 MED ORDER — VANCOMYCIN VARIABLE DOSE PER UNSTABLE RENAL FUNCTION (PHARMACIST DOSING): Status: DC

## 2020-06-10 MED ORDER — PROPOFOL 1000 MG/100ML IV EMUL
INTRAVENOUS | Status: AC
  Filled 2020-06-10: qty 100

## 2020-06-10 MED ORDER — MIDAZOLAM HCL 2 MG/2ML IJ SOLN
INTRAMUSCULAR | Status: DC | PRN
  Administered 2020-06-10: 2 mg via INTRAVENOUS

## 2020-06-10 MED ORDER — PHENYLEPHRINE 40 MCG/ML (10ML) SYRINGE FOR IV PUSH (FOR BLOOD PRESSURE SUPPORT)
PREFILLED_SYRINGE | INTRAVENOUS | Status: AC
  Filled 2020-06-10: qty 10

## 2020-06-10 MED ORDER — SODIUM BICARBONATE 8.4 % IV SOLN: 100.0000 meq | Freq: Once | INTRAVENOUS | Status: DC

## 2020-06-10 MED ORDER — ORAL CARE MOUTH RINSE
15.0000 mL | OROMUCOSAL | Status: DC
  Administered 2020-06-10 – 2020-07-06 (×255): 15 mL via OROMUCOSAL

## 2020-06-10 MED ORDER — CLINDAMYCIN PHOSPHATE 900 MG/50ML IV SOLN
900.0000 mg | Freq: Three times a day (TID) | INTRAVENOUS | Status: AC
  Administered 2020-06-10 – 2020-06-13 (×9): 900 mg via INTRAVENOUS
  Filled 2020-06-10 (×9): qty 50

## 2020-06-10 MED ORDER — NOREPINEPHRINE 4 MG/250ML-% IV SOLN
INTRAVENOUS | Status: DC | PRN
  Administered 2020-06-10: 2 ug/min via INTRAVENOUS

## 2020-06-10 MED ORDER — ALBUTEROL SULFATE (2.5 MG/3ML) 0.083% IN NEBU: 2.5000 mg | INHALATION_SOLUTION | Freq: Four times a day (QID) | RESPIRATORY_TRACT | Status: DC | PRN

## 2020-06-10 MED ORDER — NOREPINEPHRINE 4 MG/250ML-% IV SOLN
0.0000 ug/min | INTRAVENOUS | Status: DC
  Administered 2020-06-10 (×3): 50 ug/min via INTRAVENOUS
  Administered 2020-06-10 (×2): 40 ug/min via INTRAVENOUS
  Administered 2020-06-11 (×2): 25 ug/min via INTRAVENOUS
  Filled 2020-06-10 (×4): qty 250
  Filled 2020-06-10: qty 500
  Filled 2020-06-10 (×2): qty 250

## 2020-06-10 MED ORDER — POTASSIUM CHLORIDE 10 MEQ/100ML IV SOLN: 10.0000 meq | INTRAVENOUS | Status: AC

## 2020-06-10 MED ORDER — SODIUM CHLORIDE 0.9 % IV SOLN
2.0000 g | Freq: Three times a day (TID) | INTRAVENOUS | Status: DC
  Filled 2020-06-10 (×2): qty 2

## 2020-06-10 MED ORDER — CHLORHEXIDINE GLUCONATE CLOTH 2 % EX PADS
6.0000 | MEDICATED_PAD | Freq: Every day | CUTANEOUS | Status: DC
  Administered 2020-06-11 – 2020-08-05 (×56): 6 via TOPICAL

## 2020-06-10 MED ORDER — AMIODARONE HCL IN DEXTROSE 360-4.14 MG/200ML-% IV SOLN
30.0000 mg/h | INTRAVENOUS | Status: DC
  Administered 2020-06-10 – 2020-06-18 (×14): 30 mg/h via INTRAVENOUS
  Filled 2020-06-10 (×15): qty 200

## 2020-06-10 MED ORDER — AMIODARONE HCL IN DEXTROSE 360-4.14 MG/200ML-% IV SOLN
INTRAVENOUS | Status: AC
  Filled 2020-06-10: qty 200

## 2020-06-10 MED ORDER — 0.9 % SODIUM CHLORIDE (POUR BTL) OPTIME
TOPICAL | Status: DC | PRN
  Administered 2020-06-10: 1000 mL

## 2020-06-10 MED ORDER — FENTANYL 2500MCG IN NS 250ML (10MCG/ML) PREMIX INFUSION
0.0000 ug/h | INTRAVENOUS | Status: DC
  Administered 2020-06-10 – 2020-06-11 (×3): 200 ug/h via INTRAVENOUS
  Administered 2020-06-12: 300 ug/h via INTRAVENOUS
  Administered 2020-06-12: 200 ug/h via INTRAVENOUS
  Administered 2020-06-13: 300 ug/h via INTRAVENOUS
  Administered 2020-06-13: 130 ug/h via INTRAVENOUS
  Administered 2020-06-14: 200 ug/h via INTRAVENOUS
  Administered 2020-06-14: 125 ug/h via INTRAVENOUS
  Administered 2020-06-15: 250 ug/h via INTRAVENOUS
  Administered 2020-06-15 (×2): 300 ug/h via INTRAVENOUS
  Administered 2020-06-16 (×2): 400 ug/h via INTRAVENOUS
  Administered 2020-06-16: 350 ug/h via INTRAVENOUS
  Administered 2020-06-17 – 2020-06-18 (×2): 75 ug/h via INTRAVENOUS
  Administered 2020-06-18: 200 ug/h via INTRAVENOUS
  Administered 2020-06-19: 400 ug/h via INTRAVENOUS
  Administered 2020-06-19: 200 ug/h via INTRAVENOUS
  Administered 2020-06-19 – 2020-06-20 (×2): 400 ug/h via INTRAVENOUS
  Administered 2020-06-20: 300 ug/h via INTRAVENOUS
  Administered 2020-06-20 (×3): 400 ug/h via INTRAVENOUS
  Administered 2020-06-22: 125 ug/h via INTRAVENOUS
  Administered 2020-06-23: 150 ug/h via INTRAVENOUS
  Administered 2020-06-23: 125 ug/h via INTRAVENOUS
  Administered 2020-06-24: 200 ug/h via INTRAVENOUS
  Filled 2020-06-10 (×31): qty 250

## 2020-06-10 MED ORDER — VASOPRESSIN 20 UNIT/ML IV SOLN
INTRAVENOUS | Status: DC | PRN
Start: 2020-06-10 — End: 2020-06-10
  Administered 2020-06-10 (×2): 4 [IU] via INTRAVENOUS
  Administered 2020-06-10: 2 [IU] via INTRAVENOUS
  Administered 2020-06-10 (×2): 4 [IU] via INTRAVENOUS
  Administered 2020-06-10 (×2): 1 [IU] via INTRAVENOUS

## 2020-06-10 MED ORDER — STERILE WATER FOR INJECTION IV SOLN
INTRAVENOUS | Status: DC
  Filled 2020-06-10 (×3): qty 850

## 2020-06-10 MED ORDER — DOCUSATE SODIUM 50 MG/5ML PO LIQD
100.0000 mg | Freq: Two times a day (BID) | ORAL | Status: DC
  Administered 2020-06-10 – 2020-07-08 (×50): 100 mg
  Filled 2020-06-10 (×50): qty 10

## 2020-06-10 MED ORDER — INSULIN REGULAR(HUMAN) IN NACL 100-0.9 UT/100ML-% IV SOLN: INTRAVENOUS | Status: DC

## 2020-06-10 MED ORDER — PHENYLEPHRINE HCL-NACL 10-0.9 MG/250ML-% IV SOLN
INTRAVENOUS | Status: AC
  Administered 2020-06-10: 150 ug/min via INTRAVENOUS
  Filled 2020-06-10: qty 250

## 2020-06-10 MED ORDER — CHLORHEXIDINE GLUCONATE 0.12 % MT SOLN: 15.0000 mL | Freq: Once | OROMUCOSAL | Status: DC

## 2020-06-10 MED ORDER — FENTANYL CITRATE (PF) 100 MCG/2ML IJ SOLN: 50.0000 ug | INTRAMUSCULAR | Status: DC | PRN

## 2020-06-10 MED ORDER — SODIUM CHLORIDE 0.9% FLUSH
3.0000 mL | Freq: Two times a day (BID) | INTRAVENOUS | Status: DC
  Administered 2020-06-11 – 2020-07-24 (×66): 3 mL via INTRAVENOUS
  Administered 2020-07-25: 10 mL via INTRAVENOUS
  Administered 2020-07-25 – 2020-08-05 (×23): 3 mL via INTRAVENOUS

## 2020-06-10 MED ORDER — ACETAMINOPHEN 650 MG RE SUPP: 650.0000 mg | Freq: Four times a day (QID) | RECTAL | Status: DC | PRN

## 2020-06-10 MED ORDER — NORTRIPTYLINE HCL 25 MG PO CAPS
25.0000 mg | ORAL_CAPSULE | Freq: Two times a day (BID) | ORAL | Status: DC
  Filled 2020-06-10 (×2): qty 1

## 2020-06-10 MED ORDER — NORTRIPTYLINE HCL 25 MG PO CAPS
25.0000 mg | ORAL_CAPSULE | Freq: Two times a day (BID) | ORAL | Status: DC
  Administered 2020-06-10 – 2020-07-12 (×62): 25 mg
  Filled 2020-06-10 (×67): qty 1

## 2020-06-10 MED ORDER — ONDANSETRON HCL 4 MG/2ML IJ SOLN
4.0000 mg | Freq: Four times a day (QID) | INTRAMUSCULAR | Status: DC | PRN
  Administered 2020-06-14 – 2020-08-04 (×4): 4 mg via INTRAVENOUS
  Filled 2020-06-10 (×4): qty 2

## 2020-06-10 MED ORDER — VANCOMYCIN HCL 10 G IV SOLR
2500.0000 mg | Freq: Once | INTRAVENOUS | Status: AC
  Administered 2020-06-10: 2500 mg via INTRAVENOUS
  Filled 2020-06-10: qty 2500

## 2020-06-10 MED ORDER — PANTOPRAZOLE SODIUM 40 MG PO TBEC: 40.0000 mg | DELAYED_RELEASE_TABLET | Freq: Every day | ORAL | Status: DC

## 2020-06-10 MED ORDER — AMIODARONE HCL IN DEXTROSE 360-4.14 MG/200ML-% IV SOLN
60.0000 mg/h | INTRAVENOUS | Status: AC
  Administered 2020-06-10: 60 mg/h via INTRAVENOUS

## 2020-06-10 MED ORDER — PROPOFOL 10 MG/ML IV BOLUS
INTRAVENOUS | Status: DC | PRN
  Administered 2020-06-10: 50 mg via INTRAVENOUS

## 2020-06-10 MED ORDER — PREGABALIN 75 MG PO CAPS: 300.0000 mg | ORAL_CAPSULE | Freq: Two times a day (BID) | ORAL | Status: DC

## 2020-06-10 MED ORDER — LIDOCAINE 2% (20 MG/ML) 5 ML SYRINGE
INTRAMUSCULAR | Status: DC | PRN
  Administered 2020-06-10: 100 mg via INTRAVENOUS

## 2020-06-10 MED ORDER — CALCIUM CHLORIDE 10 % IV SOLN
INTRAVENOUS | Status: AC
  Filled 2020-06-10: qty 10

## 2020-06-10 MED ORDER — ONDANSETRON HCL 4 MG/2ML IJ SOLN
INTRAMUSCULAR | Status: AC
  Filled 2020-06-10: qty 2

## 2020-06-10 MED ORDER — SODIUM BICARBONATE 8.4 % IV SOLN
100.0000 meq | Freq: Once | INTRAVENOUS | Status: AC
  Administered 2020-06-10: 100 meq via INTRAVENOUS

## 2020-06-10 SURGICAL SUPPLY — 29 items
BNDG GAUZE ELAST 4 BULKY (GAUZE/BANDAGES/DRESSINGS) ×1 IMPLANT
COVER MAYO STAND STRL (DRAPES) ×2 IMPLANT
COVER SURGICAL LIGHT HANDLE (MISCELLANEOUS) ×2 IMPLANT
COVER WAND RF STERILE (DRAPES) ×1 IMPLANT
DRSG PAD ABDOMINAL 8X10 ST (GAUZE/BANDAGES/DRESSINGS) ×2 IMPLANT
ELECT CAUTERY BLADE 6.4 (BLADE) ×1 IMPLANT
ELECT REM PT RETURN 9FT ADLT (ELECTROSURGICAL) ×2
ELECTRODE REM PT RTRN 9FT ADLT (ELECTROSURGICAL) ×1 IMPLANT
GAUZE SPONGE 4X4 12PLY STRL (GAUZE/BANDAGES/DRESSINGS) IMPLANT
GLOVE BIOGEL PI IND STRL 6 (GLOVE) ×1 IMPLANT
GLOVE BIOGEL PI INDICATOR 6 (GLOVE) ×1
GLOVE BIOGEL PI MICRO 5.5 (GLOVE) ×1
GLOVE BIOGEL PI MICRO STRL 5.5 (GLOVE) ×1 IMPLANT
GOWN STRL REUS W/ TWL LRG LVL3 (GOWN DISPOSABLE) ×2 IMPLANT
GOWN STRL REUS W/TWL LRG LVL3 (GOWN DISPOSABLE) ×4
KIT BASIN OR (CUSTOM PROCEDURE TRAY) ×2 IMPLANT
KIT TURNOVER KIT B (KITS) ×2 IMPLANT
NS IRRIG 1000ML POUR BTL (IV SOLUTION) ×2 IMPLANT
PACK GENERAL/GYN (CUSTOM PROCEDURE TRAY) ×1 IMPLANT
PACK LITHOTOMY IV (CUSTOM PROCEDURE TRAY) IMPLANT
PAD ARMBOARD 7.5X6 YLW CONV (MISCELLANEOUS) ×3 IMPLANT
PENCIL SMOKE EVACUATOR (MISCELLANEOUS) ×2 IMPLANT
SPONGE LAP 18X18 RF (DISPOSABLE) ×5 IMPLANT
SURGILUBE 2OZ TUBE FLIPTOP (MISCELLANEOUS) ×1 IMPLANT
SYR BULB EAR ULCER 3OZ GRN STR (SYRINGE) ×2 IMPLANT
TOWEL GREEN STERILE (TOWEL DISPOSABLE) ×2 IMPLANT
TOWEL GREEN STERILE FF (TOWEL DISPOSABLE) ×2 IMPLANT
TUBE CONNECTING 12X1/4 (SUCTIONS) ×2 IMPLANT
YANKAUER SUCT BULB TIP NO VENT (SUCTIONS) ×2 IMPLANT

## 2020-06-10 NOTE — Consult Note (Signed)
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 Spouse at bedside and states she takes care of his wounds at home with a petrolatum based gauze, covers with ABD pads and wraps with coban. 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 WOC will not follow this wound as CCS is involved and patient will need surgery.  The wound center at [facility] had ordered compression stockings to the LLE however the spouse states that the stockings tore the skin off of his leg when she removed them and prefers not to put them on.   Reason for Consult: Chronic wounds on legs Wound type: Chronic partial thickness wounds on bilateral LE.  Right heel wound very small healing. Pressure Injury POA: NA Measurement: dererred Wound bed: BLE are tight fluid filled and the top layer of skin has been torn off in multiple areas. These areas are pink and moist with no drainage Periwound:  Erythmatous pink  Dressing procedure/placement/frequency: Gently clean both LE with soap and water, rinse and pat dry. Apply petrolatum based gauze Hart [place] # [phone]) over the areas with broken skin. Cover with ABD pad and wrap with Kerlix, then cover the LE with Ace Wrap (lawson # [id number]) to keep Kerlix in place.  Place a foam dressing over the right heel wound, cover with ABD pad and wrap with Kerlix.   Place pressure redistribution chair pad Hart [place] # [phone]) in chair for when patient gets up.  ED has sizewise air mattress ready for him to be transferred to.   Monitor the wound area(s) for worsening of condition such as: Signs/symptoms of infection, increase in size, development of or worsening of odor, development of pain, or increased pain at the affected locations.   Notify the medical team if any of these develop.   Thank you for the consult. WOC nurse will not follow at this time.   Please re-consult the WOC team if needed.  Renaldo Reel Katrinka Blazing, MSN, RN, CMSRN,  Angus Seller, [facility] Wound Treatment Associate Pager [phone]

## 2020-06-10 NOTE — Consult Note (Signed)
[facility] Surgery Consult Note  Carl Hill [birth year]  [id number].    Requesting MD: Jonah Blue Chief Complaint/Reason for Consult: NSTI  HPI:  Carl Hill is a 50yo male PMH morbid obesity, HTN, DM, Afib on xarelto (last dose 1/8 in PM) who presented to [facility] early this morning with worsening hyperglycemia and malaise. States that about 1 week ago he and his wife noted a couple boils on his left buttock. These areas have progressed and are draining. Three days ago he developed worsening malaise and subjective fevers. Denies cough, chest pain, shortness of breath.  In the ED patient was found to be tachycardic and hypotensive. WBC 17.6, lactic acid 3.7. Glucose 353, Cr 3.56.  CT pelvis shows extensive subcutaneous emphysema in the medial aspect of the upper thighs extending through perineum and superiorly to the medial left buttock; gas extends into the pelvis surrounding the anus inferiorly and tracking superiorly along the left wall of the rectum; no definite visualized fluid collections. Patient is being admitted to the medical service. He was given azactam, vancomycin, and flagyl. General surgery asked to see.  He has only had sips of water with meds today  Review of Systems  Constitutional: Positive for fever and malaise/fatigue.  Respiratory: Negative.   Cardiovascular: Positive for leg swelling. Negative for chest pain.  Gastrointestinal: Negative.   Genitourinary: Negative.   Skin:       Left buttock rash   All systems reviewed and otherwise negative except for as above  History reviewed. No pertinent family history.  Past Medical History:  Diagnosis Date  . Asthma   . Atrial fibrillation (HCC)   . Diabetes mellitus without complication (HCC)   . History of cardioversion    3 times   . Hypertension     Past Surgical History:  Procedure Laterality Date  . ABDOMINAL SURGERY    . LAPAROSCOPIC GASTRIC SLEEVE RESECTION      Social History:   reports that he has been smoking. He has never used smokeless tobacco. He reports previous alcohol use. He reports that he does not use drugs.  Allergies:  Allergies  Allergen Reactions  . Bee Venom Anaphylaxis    Other reaction(s): Unknown Other reaction(s): Unknown Other reaction(s): Unknown   . Penicillins Hives and Anaphylaxis    unknown   . Codeine Other (See Comments)    Pt states that his mother told him he is allergic, but has taken this medication multiple ties with no problems.    . Other Hives    (Not in a hospital admission)   Prior to Admission medications   Medication Sig Start Date End Date Taking? Authorizing Provider  albuterol (VENTOLIN HFA) 108 (90 Base) MCG/ACT inhaler Inhale 2 puffs into the lungs 4 (four) times daily as needed for shortness of breath or wheezing. 12/06/13  Yes [provider]  amiodarone (PACERONE) 200 MG tablet Take 1 tablet by mouth 2 (two) times daily. 01/17/15  Yes [provider]  APPLE CIDER VINEGAR PO Take 1 tablet by mouth daily.   Yes [provider]  atorvastatin (LIPITOR) 20 MG tablet Take 20 mg by mouth every evening. 04/11/19  Yes [provider]  Beta Carotene (VITAMIN A) 25000 UNIT capsule Take 25,000 Units by mouth daily.   Yes [provider]  Cholecalciferol 50 MCG (2000 UT) CAPS Take 2,000 Units by mouth daily. 06/02/19  Yes [provider]  dapagliflozin propanediol (FARXIGA) 5 MG TABS tablet Take 5 mg by mouth daily.  04/03/20  Yes [provider]  ezetimibe (ZETIA) 10 MG tablet Take 10 mg by mouth daily. 04/05/20  Yes [provider]  levothyroxine (SYNTHROID) 75 MCG tablet Take 75 mcg by mouth daily. 04/03/20  Yes [provider]  liraglutide (VICTOZA) 18 MG/3ML SOPN Inject 1.8 mLs into the skin every evening. 04/28/14  Yes [provider]  lisinopril (ZESTRIL) 10 MG tablet Take 10 mg by mouth daily. 04/05/20  Yes [provider]   metFORMIN (GLUCOPHAGE) 500 MG tablet Take 500 mg by mouth in the morning and at bedtime. 04/03/20  Yes [provider]  metoprolol tartrate (LOPRESSOR) 100 MG tablet Take 100 mg by mouth 2 (two) times daily. 04/05/20  Yes [provider]  Multiple Vitamins-Minerals (ALIVE MENS ENERGY PO) Take 1 tablet by mouth daily.   Yes [provider]  nortriptyline (PAMELOR) 25 MG capsule Take 25 mg by mouth in the morning and at bedtime. 03/13/20  Yes [provider]  nystatin (MYCOSTATIN/NYSTOP) powder Apply 1 application topically daily as needed (rash). 04/11/19  Yes [provider]  Omega-3 Fatty Acids (FISH OIL) 1000 MG CAPS Take 1,000 mg by mouth daily.   Yes [provider]  omeprazole (PRILOSEC) 20 MG capsule Take 20 mg by mouth in the morning and at bedtime. 03/25/20  Yes [provider]  polyethylene glycol powder (GLYCOLAX/MIRALAX) 17 GM/SCOOP powder Take 17 g by mouth daily as needed for mild constipation.   Yes [provider]  pregabalin (LYRICA) 300 MG capsule Take 300 mg by mouth in the morning and at bedtime. 03/13/20  Yes [provider]  XARELTO 20 MG TABS tablet Take 20 mg by mouth daily. 04/19/20  Yes [provider]  Zinc 220 (50 Zn) MG CAPS Take 220 mg by mouth daily.   Yes [provider]    Blood pressure (!) 95/51, pulse (!) 107, temperature 98.2 F (36.8 C), temperature source Oral, resp. rate (!) 21, height 6\' 5"  (1.956 m), weight (!) 259.2 kg, SpO2 96 %. Physical Exam: General: pleasant, overweight male who is laying in bed in NAD HEENT: head is normocephalic, atraumatic.  Sclera are noninjected.  PERRL.  Ears and nose without any masses or lesions.  Mouth is pink and moist. Dentition fair Heart: regular, rate, and rhythm.  Normal s1,s2. No obvious murmurs, gallops, or rubs noted.  Feet WWP Lungs: CTAB, no wheezes, rhonchi, or rales noted.  Respiratory effort nonlabored Abd: obese,  soft, NT/ND, +BS, no masses, hernias, or organomegaly MS: dressings to bilateral lower legs.  Skin: warm and dry with no masses, lesions, or rashes Psych: A&Ox4 with an appropriate affect Neuro: cranial nerves grossly intact, moving all 4 extremities, normal speech, thought process intact Left buttock: exam difficult because patient has difficulty with bed mobility, wounds pictured below       Results for orders placed or performed during the hospital encounter of 06/10/20 (from the past 48 hour(s))  CBG monitoring, ED     Status: Abnormal   Collection Time: 06/10/20  2:39 AM  Result Value Ref Range   Glucose-Capillary 353 (H) 70 - 99 mg/dL    Comment: Glucose reference range applies only to samples taken after fasting for at least 8 hours.  Lactic acid, plasma     Status: Abnormal   Collection Time: 06/10/20  2:44 AM  Result Value Ref Range   Lactic Acid, Venous 3.7 (HH) 0.5 - 1.9 mmol/L    Comment: CRITICAL RESULT CALLED TO, READ BACK  BY AND VERIFIED WITH: Alvan Dame A,RN 06/10/20 0400 WAYK  Performed at [facility], [address] [place], [place] [address]   CBC with Differential/Platelet     Status: Abnormal   Collection Time: 06/10/20  2:44 AM  Result Value Ref Range   WBC 17.6 (H) 4.0 - 10.5 K/uL   RBC 4.36 4.22 - 5.81 MIL/uL   Hemoglobin 12.3 (L) 13.0 - 17.0 g/dL   HCT 84.6 (L) 65.9 - 93.5 %   MCV 88.3 80.0 - 100.0 fL   MCH 28.2 26.0 - 34.0 pg   MCHC 31.9 30.0 - 36.0 g/dL   RDW 70.1 77.9 - 39.0 %   Platelets 199 150 - 400 K/uL   nRBC 0.0 0.0 - 0.2 %   Neutrophils Relative % 87 %   Neutro Abs 15.4 (H) 1.7 - 7.7 K/uL   Lymphocytes Relative 5 %   Lymphs Abs 0.8 0.7 - 4.0 K/uL   Monocytes Relative 6 %   Monocytes Absolute 1.0 0.1 - 1.0 K/uL   Eosinophils Relative 1 %   Eosinophils Absolute 0.2 0.0 - 0.5 K/uL   Basophils Relative 0 %   Basophils Absolute 0.1 0.0 - 0.1 K/uL   Immature Granulocytes 1 %   Abs Immature Granulocytes 0.16 (H) 0.00 - 0.07 K/uL     Comment: Performed at [facility], [address], [place], [place] [address]  Comprehensive metabolic panel     Status: Abnormal   Collection Time: 06/10/20  2:44 AM  Result Value Ref Range   Sodium 128 (L) 135 - 145 mmol/L   Potassium 4.3 3.5 - 5.1 mmol/L   Chloride 94 (L) 98 - 111 mmol/L   CO2 18 (L) 22 - 32 mmol/L   Glucose, Bld 362 (H) 70 - 99 mg/dL    Comment: Glucose reference range applies only to samples taken after fasting for at least 8 hours.   BUN 36 (H) 6 - 20 mg/dL   Creatinine, Ser 3.30 (H) 0.61 - 1.24 mg/dL   Calcium 8.4 (L) 8.9 - 10.3 mg/dL   Total Protein 6.3 (L) 6.5 - 8.1 g/dL   Albumin 2.0 (L) 3.5 - 5.0 g/dL   AST 25 15 - 41 U/L   ALT 23 0 - 44 U/L   Alkaline Phosphatase 87 38 - 126 U/L   Total Bilirubin 1.3 (H) 0.3 - 1.2 mg/dL   GFR, Estimated 20 (L) >60 mL/min    Comment: (NOTE) Calculated using the CKD-EPI Creatinine Equation (2021)    Anion gap 16 (H) 5 - 15    Comment: Performed at [facility], [address], [place], [place] [address]   DG Chest 1 View  Result Date: 06/10/2020 CLINICAL DATA:  Weakness EXAM: CHEST  1 VIEW COMPARISON:  Radiograph 10/31/2003 FINDINGS: Low lung volumes. There are streaky and ill-defined opacities in the lung bases. No pneumothorax or pleural effusion. Pulmonary vascularity remains fairly well-defined. A prominent cardiac silhouette may be accentuated by low volumes and portable technique. IMPRESSION: Streaky and ill-defined opacities in the lung bases, favored to reflect atelectasis given low volumes though early airspace disease or atypical infection could have a similar appearance in the appropriate clinical setting. Electronically Signed   By: Kreg Shropshire M.D.   On: 06/10/2020 04:06   CT PELVIS WO CONTRAST  Result Date: 06/10/2020 CLINICAL DATA:  50 year old male with anal abscess. EXAM: CT PELVIS WITHOUT CONTRAST TECHNIQUE: Multidetector CT imaging of the pelvis was performed following the standard  protocol  without intravenous contrast. COMPARISON:  None. FINDINGS: Urinary Tract: Mildly distended bladder without definite evidence of wall thickening. Bowel: Perianal gas is noted, possibly involving the mural aspect of the anus. Otherwise unremarkable visualized pelvic bowel loops. Vascular/Lymphatic: Prominent bilateral inguinal lymph nodes, likely reactive. No significant vascular abnormality seen. Reproductive:  The prostate is present unremarkable. Other: Extensive subcutaneous emphysema extending from the medial aspect of the upper thighs, through the perineum, and into the posterior aspect of the left buttock. There is extension of the emphysema into the pelvis in the. Anal region tracking along the left aspect of the rectum. No definite associated fluid collections. Musculoskeletal: No acute fracture or aggressive appearing osseous lesion. IMPRESSION: Extensive subcutaneous emphysema in the medial aspect of the upper thighs extending through perineum and superiorly to the medial left buttock. Gas extends into the pelvis surrounding the anus inferiorly and tracking superiorly along the left wall of the rectum. No definite visualized fluid collections. 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 Recommend emergent surgical consultation. These results were called by telephone at the time of interpretation on 06/10/2020 at 8:09 am to provider Jonah Blue, MD , who verbally acknowledged these results. Marliss Coots, MD [facility] Radiology Electronically Signed   By: Marliss Coots MD   On: 06/10/2020 08:23      Assessment/Plan Morbid obesity BMI 67.76 HTN DM Diabetic neuropathy Chronic venous stasis with lower extremity wounds Afib on xarelto (last dose 1/8 in PM) AKI Hyperglycemia Sepsis  Necrotizing soft tissue infection left buttock - To OR today for extensive debridement. Covid test is pending.  Keep NPO. Continue broad spectrum antibiotics.   ID - azactam, vancomycin, flagyl VTE - SCDs, hold xarelto FEN - IVF, NPO Foley - none Follow up - TBD  Franne Forts, [facility] Surgery 06/10/2020, 9:38 AM Please see Amion for pager number during day hours 7:00am-4:30pm

## 2020-06-10 NOTE — Progress Notes (Addendum)
ANTICOAGULATION CONSULT NOTE  Pharmacy Consult for heparin Indication: atrial fibrillation  Heparin Dosing Weight: 155.7 kg  Labs: Recent Labs    06/10/20 0244  HGB 12.3*  HCT 38.5*  PLT 199  CREATININE 3.56*    Assessment: 49 yom with hx of afib on Xarelto, presenting with necrotizing soft tissue infxn to L buttock with Surgery planning I&D today. Last dose of Xarelto reported 1/8 PM. Pharmacy consulted to transition to heparin post-op, with timing per Surgery. Hg 12.3, plt wnl. Noted SCr 3.56 on admit (baseline not available in chart). No current active bleed issues reported.  Will monitor using aPTT while Xarelto may still be influencing heparin level.  Goal of Therapy:  Heparin level 0.3-0.7 units/ml aPTT 66-102 seconds Monitor platelets by anticoagulation protocol: Yes   Plan:  Baseline APTT/heparin level No bolus with recent Xarelto/procedure Start heparin at 2150 units/h when approved to begin post-op per Surgery (Dr. Freida Busman to contact Pharmacy for exact timing) 8h aPTT from start Monitor daily heparin level/aPTT/CBC, s/sx bleeding   Babs Bertin, PharmD, BCPS Clinical Pharmacist 06/10/2020 10:35 AM

## 2020-06-10 NOTE — Anesthesia Procedure Notes (Signed)
Arterial Line Insertion Start/End1/03/2021 1:57 PM, 06/10/2020 2:10 PM Performed by: Eilene Ghazi, MD  Patient location: Pre-op. Preanesthetic checklist: patient identified, IV checked, site marked, risks and benefits discussed, surgical consent, monitors and equipment checked, pre-op evaluation, timeout performed and anesthesia consent Left, brachial was placed Catheter size: 20 Fr Hand hygiene performed  and maximum sterile barriers used   Attempts: 1 Procedure performed using ultrasound guided technique. Ultrasound Notes:anatomy identified, needle tip was noted to be adjacent to the nerve/plexus identified, no ultrasound evidence of intravascular and/or intraneural injection and image(s) printed for medical record Following insertion, dressing applied and Biopatch. Post procedure assessment: normal and unchanged  Patient tolerated the procedure well with no immediate complications.

## 2020-06-10 NOTE — Progress Notes (Signed)
Dr. Judeth Horn is notified about pt's persistent increased HR in 130s. Dr. Judeth Horn ordered to discontinue propofol drip and start patient on Fentanyl drip.

## 2020-06-10 NOTE — Progress Notes (Signed)
Code sepsis initiated @ 0404 AM, Elink following.

## 2020-06-10 NOTE — Progress Notes (Signed)
Surgeon stopped by to check on the patient. She was updated about pt's deteriorated current condition and brief Code event. Surgeon obtained phone number for pt's mother to update her about surgery and current condition.

## 2020-06-10 NOTE — H&P (Addendum)
History and Physical    Carl Hill UXL:[id number] DOB: 1970/11/03 DOA: 06/10/2020  PCP: Malka So., MD Consultants:  [place] Siren - cardiology Patient coming from:  Home - lives with mother; [facility]: Mother, [phone]  Chief Complaint:  Hyperglycemia  HPI: Carl Hill is a 50 y.o. male with medical history significant of morbid obesity; HTN; DM with foot ulcers; and afib presenting with hyperglycemia.  He started Friday/Saturday with dizziness, lightheadedness, headache.  Glucose was 406, usually in the 200s.  He was trying to drink more water but only had UOP a couple of times the last few days.  He has chronic LE wounds - L heel wound with ongoing care for some time and L calf skin tear more recently.  En route, he had a skin tear of his R calf.  His mother noticed a boli on his buttocks on Thursday.  She cleaned it whenever she wiped him after Ms.  He developed another one adjacent to it on Friday and it has extended to some extent over the weekend.     ED Course:  Carryover, per Dr. Toniann Fail:  50 year old male with known history of diabetes mellitus presents to the ER because of weakness was found to be hypotensive with blood pressure responding to fluids. Likely septic from gluteal cellulitis. Will need a CT scan of the pelvis to further assess once patient is stable.  Review of Systems: As per HPI; otherwise review of systems reviewed and negative.   Ambulatory Status:  Ambulates with a cane in the house, a walker outside the home  COVID Vaccine Status:   Complete, no booster  Past Medical History:  Diagnosis Date  . Asthma   . Atrial fibrillation (HCC)   . Diabetes mellitus without complication (HCC)   . History of cardioversion    3 times   . Hypertension     Past Surgical History:  Procedure Laterality Date  . ABDOMINAL SURGERY    . LAPAROSCOPIC GASTRIC SLEEVE RESECTION      Social History   Socioeconomic History  . Marital status: Single     Spouse name: Not on file  . Number of children: Not on file  . Years of education: Not on file  . Highest education level: Not on file  Occupational History  . Not on file  Tobacco Use  . Smoking status: Current Every Day Smoker  . Smokeless tobacco: Never Used  . Tobacco comment: 1 cigarette per day  Vaping Use  . Vaping Use: Never used  Substance and Sexual Activity  . Alcohol use: Not Currently    Comment: Has not had a drink in 1 year  . Drug use: Never  . Sexual activity: Not on file  Other Topics Concern  . Not on file  Social History Narrative  . Not on file   Social Determinants of Health   Financial Resource Strain: Not on file  Food Insecurity: Not on file  Transportation Needs: Not on file  Physical Activity: Not on file  Stress: Not on file  Social Connections: Not on file  Intimate Partner Violence: Not on file    Allergies  Allergen Reactions  . Bee Venom Anaphylaxis    Other reaction(s): Unknown Other reaction(s): Unknown Other reaction(s): Unknown   . Penicillins Hives and Anaphylaxis    unknown   . Sglt2 Inhibitors Other (See Comments)    Necrotizing infection of the perineum  . Codeine Other (See Comments)    Pt states that his mother  told him he is allergic, but has taken this medication multiple ties with no problems.    . Other Hives    History reviewed. No pertinent family history.  Prior to Admission medications   Medication Sig Start Date End Date Taking? Authorizing Provider  albuterol (VENTOLIN HFA) 108 (90 Base) MCG/ACT inhaler Inhale 2 puffs into the lungs 4 (four) times daily as needed for shortness of breath or wheezing. 12/06/13  Yes [provider]  amiodarone (PACERONE) 200 MG tablet Take 1 tablet by mouth 2 (two) times daily. 01/17/15  Yes [provider]  APPLE CIDER VINEGAR PO Take 1 tablet by mouth daily.   Yes [provider]  atorvastatin (LIPITOR) 20 MG tablet Take 20 mg by mouth every evening.  04/11/19  Yes [provider]  Beta Carotene (VITAMIN A) 25000 UNIT capsule Take 25,000 Units by mouth daily.   Yes [provider]  Cholecalciferol 50 MCG (2000 UT) CAPS Take 2,000 Units by mouth daily. 06/02/19  Yes [provider]  dapagliflozin propanediol (FARXIGA) 5 MG TABS tablet Take 5 mg by mouth daily. 04/03/20  Yes [provider]  ezetimibe (ZETIA) 10 MG tablet Take 10 mg by mouth daily. 04/05/20  Yes [provider]  levothyroxine (SYNTHROID) 75 MCG tablet Take 75 mcg by mouth daily. 04/03/20  Yes [provider]  liraglutide (VICTOZA) 18 MG/3ML SOPN Inject 1.8 mLs into the skin every evening. 04/28/14  Yes [provider]  lisinopril (ZESTRIL) 10 MG tablet Take 10 mg by mouth daily. 04/05/20  Yes [provider]  metFORMIN (GLUCOPHAGE) 500 MG tablet Take 500 mg by mouth in the morning and at bedtime. 04/03/20  Yes [provider]  metoprolol tartrate (LOPRESSOR) 100 MG tablet Take 100 mg by mouth 2 (two) times daily. 04/05/20  Yes [provider]  Multiple Vitamins-Minerals (ALIVE MENS ENERGY PO) Take 1 tablet by mouth daily.   Yes [provider]  nortriptyline (PAMELOR) 25 MG capsule Take 25 mg by mouth in the morning and at bedtime. 03/13/20  Yes [provider]  nystatin (MYCOSTATIN/NYSTOP) powder Apply 1 application topically daily as needed (rash). 04/11/19  Yes [provider]  Omega-3 Fatty Acids (FISH OIL) 1000 MG CAPS Take 1,000 mg by mouth daily.   Yes [provider]  omeprazole (PRILOSEC) 20 MG capsule Take 20 mg by mouth in the morning and at bedtime. 03/25/20  Yes [provider]  polyethylene glycol powder (GLYCOLAX/MIRALAX) 17 GM/SCOOP powder Take 17 g by mouth daily as needed for mild constipation.   Yes [provider]  pregabalin (LYRICA) 300 MG capsule Take 300 mg by mouth in the morning and at bedtime. 03/13/20  Yes [provider]  XARELTO 20 MG TABS tablet Take 20 mg by mouth daily. 04/19/20  Yes [provider]  Zinc 220 (50 Zn) MG CAPS Take 220 mg by mouth daily.   Yes [provider]    Physical Exam: Vitals:   06/10/20 1430 06/10/20 1442 06/10/20 1445 06/10/20 1500  BP: (!) 125/94  (!) 77/44 (!) 83/42  Pulse: 92  92 94  Resp: 20  20 20   Temp:    98.6 F (37 C)  TempSrc: Oral   Oral  SpO2: 97% 100% 97% 97%  Weight:      Height:         . General:  Appears calm and comfortable and is in NAD; lying fairly immobile on L side, unable to manipulate himself  into a position of comfort . Eyes:   EOMI, normal lids, iris . ENT:  grossly normal hearing, lips & tongue, mildly dry mm . Neck:  no LAD, masses or thyromegaly . Cardiovascular:  RRR, no m/r/g.  . Respiratory:   CTA bilaterally with no wheezes/rales/rhonchi.  Normal respiratory effort. . Abdomen:  soft, NT, ND, NABS . Buttocks:  Purulent drainage with necrotic appearing ulcerations on primarily left buttocks       . Skin:  As above; also with B LE wounds with wraps in place (seen by wound care just prior to my evaluation) . Musculoskeletal:  Mildly decreased tone BUE/BLE,  no bony abnormality, L heel wrap in place and not removed (seen by wound care just prior) . Psychiatric:  grossly normal mood and affect, speech fluent and appropriate, AOx3 . Neurologic:  CN 2-12 grossly intact, moves all extremities in coordinated fashion    Radiological Exams on Admission: Independently reviewed - see discussion in A/P where applicable  DG Chest 1 View  Result Date: 06/10/2020 CLINICAL DATA:  Weakness EXAM: CHEST  1 VIEW COMPARISON:  Radiograph 10/31/2003 FINDINGS: Low lung volumes. There are streaky and ill-defined opacities in the lung bases. No pneumothorax or pleural effusion. Pulmonary vascularity remains fairly well-defined. A prominent cardiac silhouette may be accentuated by low volumes and portable technique.  IMPRESSION: Streaky and ill-defined opacities in the lung bases, favored to reflect atelectasis given low volumes though early airspace disease or atypical infection could have a similar appearance in the appropriate clinical setting. Electronically Signed   By: Kreg Shropshire M.D.   On: 06/10/2020 04:06   CT PELVIS WO CONTRAST  Result Date: 06/10/2020 CLINICAL DATA:  50 year old male with anal abscess. EXAM: CT PELVIS WITHOUT CONTRAST TECHNIQUE: Multidetector CT imaging of the pelvis was performed following the standard protocol without intravenous contrast. COMPARISON:  None. FINDINGS: Urinary Tract: Mildly distended bladder without definite evidence of wall thickening. Bowel: Perianal gas is noted, possibly involving the mural aspect of the anus. Otherwise unremarkable visualized pelvic bowel loops. Vascular/Lymphatic: Prominent bilateral inguinal lymph nodes, likely reactive. No significant vascular abnormality seen. Reproductive:  The prostate is present unremarkable. Other: Extensive subcutaneous emphysema extending from the medial aspect of the upper thighs, through the perineum, and into the posterior aspect of the left buttock. There is extension of the emphysema into the pelvis in the. Anal region tracking along the left aspect of the rectum. No definite associated fluid collections. Musculoskeletal: No acute fracture or aggressive appearing osseous lesion. IMPRESSION: Extensive subcutaneous emphysema in the medial aspect of the upper thighs extending through perineum and superiorly to the medial left buttock. Gas extends into the pelvis surrounding the anus inferiorly and tracking superiorly along the left wall of the rectum. No definite visualized fluid collections. 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 Recommend emergent surgical consultation. These results were called by telephone at the time of interpretation on 06/10/2020 at 8:09 am to  provider Jonah Blue, MD , who verbally acknowledged these results. Marliss Coots, MD [facility]iology Electronically Signed   By: Marliss Coots MD   On: 06/10/2020 08:23   DG CHEST PORT 1 VIEW  Result Date: 06/10/2020 CLINICAL DATA:  Endotracheal tube placement. EXAM: PORTABLE CHEST 1 VIEW COMPARISON:  Same day. FINDINGS: Stable cardiomediastinal silhouette. Endotracheal tube is in good position. Nasogastric tube is seen entering the stomach. Right internal jugular catheter is noted with distal tip in expected position of the  SVC. No pneumothorax is noted. Mild bibasilar subsegmental atelectasis is noted. Fluid is noted in the right minor fissure. Bony thorax is unremarkable. IMPRESSION: Endotracheal tube in good position. Nasogastric tube is seen entering the stomach. Right internal jugular catheter in good position. Mild bibasilar subsegmental atelectasis. Electronically Signed   By: Lupita Raider M.D.   On: 06/10/2020 16:09    EKG: Independently reviewed.  NSR with rate 97; IVCD with no evidence of acute ischemia   Labs on Admission: I have personally reviewed the available labs and imaging studies at the time of the admission.  Pertinent labs:   Na++ 128 CO2 18 Glucose 362 BUN 36/Creatinine 3.56/GFR 20; 8/0.89/>60 on 03/25/20 Anion gap 16 Lactate 3.7 WBC 17.6 Hgb 12.3 COVID pending   Assessment/Plan Principal Problem:   Severe sepsis with septic shock (HCC) Active Problems:   Fournier gangrene   DKA (diabetic ketoacidosis) (HCC)   AKI (acute kidney injury) (HCC)   Diabetic ulcer of heel (HCC)   Atrial fibrillation, chronic (HCC)   Essential hypertension   Dyslipidemia   Acquired hypothyroidism   Chronic pain disorder   Morbid obesity with BMI of 60.0-69.9, adult (HCC)   Sepsis with shock due to Fournier's gangrene -Sepsis indicates life-threatening organ dysfunction with mortality >10%, caused by dysregulation to  host response.   -SIRS criteria in this patient includes: Leukocytosis, tachycardia , tachypnea -Patient has evidence of acute organ failure with elevated lactate >2; recurrent hypotension (SBP < 90 or MAP < 65 x 2 readings); creatinine >2 that is not easily explained by another condition. -While awaiting blood cultures, this appears to be a preseptic condition. -Sepsis protocol initiated -Suspected source is buttocks abscess (see below) -Blood and urine cultures pending -Will admit due to: hemodynamic instability;strong likelihood of need for ICU management post-operatively -Treat with IV Cefepime/Vanc -Will trend lactate to ensure improvement -Will order sepsis protocol procalcitonin level.  Antibiotics would not be indicated for PCT <0.1 and probably should not be used for < 0.25.  >0.5 indicates infection and >>0.5 indicates more serious disease.  As the procalcitonin level normalizes, it will be reasonable to consider de-escalation of antibiotic coverage. -This patient is at risk for shock and may require vasopressors to keep MAP >65 and/or due to lactate >2 despite volume resuscitation; shock is associated with >40% mortality. -PCCM has been consulted due to high likelihood that he will require ICU admission post-operatively.  Fournier's gangrene -CT shows extensive necrotizing infection extending into the pelvis, anus, and rectum -As such, surgery was notified so that patient could be taken to the OR for emergent debridement -This generally requires multiple surgeries with wound vac and often requires patients to leave the OR still intubated -PCCM consult also requested  Uncontrolled DM with mild DKA -Patient with poor baseline control, A1c 13.1 in 03/2020 -He has been on SGLT-2 inhibitor and these medications can increase the risk for necrotizing infections of the perineum - will add to allergy list -Mild DKA on admission based on HCO3 18, anion gap 16, patient alert and possibly a bit  drowsy -Will admit to SDU with DKA protocol -K+ normal at time of presentation and so potassium supplementation added -IVF at 150 cc/hr, LR until glucose <250 and then decrease rate to 125 and change to D5LR -Needs diabetes coordinator and dietician consults -Hold Victoza, Glucophage  AKI -AKI is likely associated with severe and necrotizing infection in conjunction with DKA -Will rehydrate and follow  Diabetic foot ulcers -Mother reports that these are stable/healing -  Wound care consult requested  Afib -Rate controlled with Amiodarone -Hold Xarelto and start Heparin drip when approved by surgery  HLD -Continue Lipitor, Zetia  HTN -Hold Lisinopril in the setting of renal failure -Hold Lopressor in the setting of hypotension, septic shock  Hypothyroidism -Check TSH - it was 7.280 in 03/2020 -Continue Synthroid at current dose for now  Chronic pain -Continue nortriptyline, Lyrica  Super morbid obesity -Body mass index is 67.76 kg/m..  -Weight loss should be encouraged -Outpatient PCP/bariatric medicine/bariatric surgery f/u encouraged     Total critical care time: 65 minutes Critical care time was exclusive of separately billable procedures and treating other patients. Critical care was necessary to treat or prevent imminent or life-threatening deterioration. Critical care was time spent personally by me on the following activities: development of treatment plan with patient and/or surrogate as well as nursing, discussions with consultants, evaluation of patient's response to treatment, examination of patient, obtaining history from patient or surrogate, ordering and performing treatments and interventions, ordering and review of laboratory studies, ordering and review of radiographic studies, pulse oximetry and re-evaluation of patient's condition.     Note: This patient has been tested and is negative for the novel coronavirus COVID-19. he has been fully vaccinated  against COVID-19.    DVT prophylaxis:   Heparin - when approved by surgery Code Status:  Full - confirmed with patient/family Family Communication: Mother present throughout evaluation  Disposition Plan:  The patient is from: home  Anticipated d/c is to: be determined  Anticipated d/c date will depend on clinical response to treatment, likely days to weeks Patient is currently: acutely ill Consults called: PCCM; Surgery; DM coordinator Admission status: Admit - It is my clinical opinion that admission to INPATIENT is reasonable and necessary because of the expectation that this patient will require hospital care that crosses at least 2 midnights to treat this condition based on the medical complexity of the problems presented.  Given the aforementioned information, the predictability of an adverse outcome is felt to be significant.   Jonah BlueJennifer Madiha Bambrick MD [facility]   How to contact the [facility] Attending or Consulting provider 7A - 7P or covering provider during after hours 7P -7A, for this patient?  1. Check the care team in [facility] and look for a) attending/consulting TR[facility] provider listed and b) the [facility] team listed 2. Log into [email or web address] and use [facility] universal password to access. If you do not have the password, please contact the hospital operator. 3. Locate the [facility] provider you are looking for under [facility] and page to a number that you can be directly reached. 4. If you still have difficulty reaching the provider, please page the [facility] (Director on Call) for the Hospitalists listed on amion for assistance.   06/10/2020, 4:15 PM

## 2020-06-10 NOTE — Op Note (Signed)
Date: 06/10/20  Patient: Carl Hill MRN: [id number]  Preoperative Diagnosis: Gluteal necrotizing soft tissue infection Postoperative Diagnosis: Same  Procedure: Excisional debridement of perianal, gluteal and perineal necrotizing soft tissue infection  Surgeon: Sophronia Simas, MD  EBL: 100 mL  Anesthesia: General  Specimens: Left gluteal tissue, right gluteal tissue  Indications: [name] is a 50 yo male with diabetes who developed perianal abscesses about a week ago. A few days ago he developed chills and progressive malaise, as well as worsening hyperglycemia. He presented to the ED early this morning and was found to have a significant leukocytosis and acute kidney injury. CT scan showed soft tissue gas in the left gluteus, tracking towards the left thigh. Surgery was consulted and he was brought to the OR for emergent debridement.  Findings: Soft tissue necrosis of the perianal and perirectal tissue extending onto the left and right buttocks, more extensive on the left. There was also extension anteriorly onto the peritoneum. Total debrided area measured 20x18x10cm.  Procedure details: Informed consent was obtained in the preoperative area prior to the procedure. The patient was brought to the operating room, general anesthesia was induced and appropriate lines and drains were placed for intraoperative monitoring. The patient was placed on the table in the prone position. Perioperative antibiotics were administered per SCIP guidelines. The patient was prepped and draped in the usual sterile fashion. A pre-procedure timeout was taken verifying patient identity, surgical site and procedure to be performed.  There was an area of obvious necrosis on the left buttock. This was incised and the subcutaneous tissue was necrotic. This area was widely debrided laterally and superiorly to bleeding healthy tissue. Medially the tissue was debrided to the perianal skin and perirectal soft  tissues, but the rectum was not violated and the anal sphincter remained in tact. The debridement was carried inferiorly across midline onto the right buttock, and anteriorly onto the perineum. Necrotic tissue was debrided away using cautery until healthy bleeding tissue was identified at all wound edges. The wound was irrigated and hemostasis was achieved with cautery. The wound was packed with saline-moistened kerlex and covered with ABD pads.  All counts were correct x2 at the end of the procedure. The remained intubated and was transported to the ICU for postoperative care.  Sophronia Simas, MD 06/10/20 1:46 PM

## 2020-06-10 NOTE — Anesthesia Procedure Notes (Signed)
Anesthesia Procedure Image    

## 2020-06-10 NOTE — Progress Notes (Signed)
eLink Physician-Brief Progress Note Patient Name: Carl Hill DOB: 1970/06/14 MRN: [id number]   Date of Service  06/10/2020  HPI/Events of Note  ABG on 100%/PRVC 30/TV 600/P 10 = 7.14/49.4/98.  eICU Interventions  Plan: 1. NaHCO3 100 meq IV now.  2. Increase NaHCO3 IV infusion to 125 mL/hour. 3. Repeat ABG at 12 midnight.      Intervention Category Major Interventions: Respiratory failure - evaluation and management;Acid-Base disturbance - evaluation and management  Cresencio Reesor Eugene 06/10/2020, 9:12 PM

## 2020-06-10 NOTE — Progress Notes (Signed)
eLink Physician-Brief Progress Note Patient Name: Carl Hill DOB: 1970-08-20 MRN: [id number]   Date of Service  06/10/2020  HPI/Events of Note  Hypotension - BP = 81/50 with MAP = 59 and HR = 120. Last pH = 7.124. Currently on Norepinephrine and Phenylephrine IV infusions at ceiling doses. Also on Vasopressin IV infusion at shock dose.   eICU Interventions  Plan: 1. ABG STAT.  2. Increase ceiling on Norepinephrine IV infusion to 60 mcg/min. 3. NaHCO3 IV infusion at 100 mL/hour.      Intervention Category Major Interventions: Hypotension - evaluation and management  Raimi Guillermo Eugene 06/10/2020, 8:10 PM

## 2020-06-10 NOTE — Sepsis Progress Note (Signed)
Repeat lactic acid was documented as drawn towards the end of the 6 hour protocol. When following up it was noted to be canceled. Secure chat sent to bedside RN regarding why it was canceled and the need for the repeat lactic acid.

## 2020-06-10 NOTE — Progress Notes (Signed)
Inpatient Diabetes Program Recommendations  AACE/ADA: New Consensus Statement on Inpatient Glycemic Control (2015)  Target Ranges:  Prepandial:   less than 140 mg/dL      Peak postprandial:   less than 180 mg/dL (1-2 hours)      Critically ill patients:  140 - 180 mg/dL   Lab Results  Component Value Date   GLUCAP 278 (H) 06/10/2020   Inpatient Diabetes Program Recommendations:    Awaiting results of BMP, beta-hydroxybutyrate- If labs indicate DKA, will need IV insulin/DKA order set.  Notified RN and NP, Joneen Roach.   Thanks,  Beryl Meager, RN, BC-ADM Inpatient Diabetes Coordinator Pager [phone] (8a-5p)

## 2020-06-10 NOTE — Consult Note (Signed)
NAME:  Carl BossJonathan D Cubero, MRN:  [id number], DOB:  Jul 24, 1970, LOS: 0 ADMISSION DATE:  06/10/2020, CONSULTATION DATE:  1/10  REFERRING MD:  yates, CHIEF COMPLAINT:  Severe sepsis   Brief History:  50 year old white male admitted with necrotizing fasciitis of the left buttock, left upper thigh, and perineum, with associated severe sepsis/septic shock.  Critical care consulted  History of Present Illness:  This is a 50 year old chronically ill-appearing obese white male currently lying in bed, lethargic but not in acute distress.  Presented to the emergency room accompanied by his wife the a.m. of 1/10 for evaluation of hypoglycemia, Malaise and fatigue.  Patient had been in his usual state of health until approximately 1 week prior to presentation at which time his wife started to notice he had 2 boils on his left buttocks near the left gluteal fold.  She initially dressed these, after cleaning them.  The patient had progressive pain in the left buttocks, decreased activity, and then wife started noting more hyperglycemia with blood glucose as high as the 400s and because of this he presented to the emergency room.  On arrival to the emergency room he was found to be febrile, his blood glucose was in the 300s, white blood cell count over 17,000, he had a mild lactic acidosis of 3.6, he was tachycardic and hypotensive.  Cultures were sent, CT imaging obtained of the pelvis showing extensive subcutaneous emphysema in the medial aspect of the upper thigh extending throughout the perineum and left medial buttocks with gas extending into the pelvis surrounding the anus.  General surgery was consulted emergently broad-spectrum antibiotic started IV fluid administered critical care consulted and concern for progressive clinical decline Past Medical History:  HTN, obesity, DM, afib on DOAC,   Significant Hospital Events:  1/10: Admitted, culture sent, general surgery consulted, broad-spectrum antibiotics  initiated, IV fluids initiated Chronic diabetic foot ulcers Consults:  General surgery Wound care  Procedures:    Significant Diagnostic Tests:  1/10 CT pelvis: Extensive subcutaneous emphysema in the medial aspect of the upper thighs extending through perineum and superiorly to the medial left buttock. Gas extends into the pelvis surrounding the anus inferiorly and tracking superiorly along the left wall of the rectum. No definite visualized fluid collections. 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   Micro Data:  Respiratory culture by PCR 1/10: Negative for influenza and Covid Blood cultures x2 sent 1/10>>> Antimicrobials:  vanc 1/10>>> Flagyl 1/10>>> azactam 1/10>>>  Interim History / Subjective:  Still feels very weak   Objective   Blood pressure (Abnormal) 134/105, pulse (Abnormal) 108, temperature 97.7 F (36.5 C), temperature source Oral, resp. rate (Abnormal) 22, height 6\' 5"  (1.956 m), weight (Abnormal) 259.2 kg, SpO2 95 %.        Intake/Output Summary (Last 24 hours) at 06/10/2020 1055 Last data filed at 06/10/2020 0546 Gross per 24 hour  Intake 1100 ml  Output no documentation  Net 1100 ml   Filed Weights   06/10/20 0216  Weight: (Abnormal) 259.2 kg    Examination: General: this is a obese 50 year old white male. Resting in bed. Lethargic  HENT: MMM neck large. Pupils equal Lungs: dec t/o no accessory use  Cardiovascular: tachy rrr no MRG Abdomen: obese  Extremities: chronic bilateral LE erythema and swelling. Both legs wrapped. Left buttocks ulcerated, extensive ecchymosis over the entire buttocks extending down left posterior thigh and over the right buttocks  Neuro: awake. Oriented but gets confused at  times GU: voids  Resolved Hospital Problem list     Assessment & Plan:  Severe sepsis/septic shock w/ mild lactic acidosis 2/2 necrotizing fascitis involving the left buttocks,  peritoneum and extending into left thigh -onFarxgia at baseline  -seen by surg -initial LA  3.7 Plan Repeat fluid challenge Cultures sent Day 1 vanc/azactama and flagyl  On schedule for emergent OR for I&D ->suspect he will need extensive debridement and likely multiple OR trips Repeat LA post-op Anticipate will need central access & pressors Trend CBC  Mild acute metabolic encephalopathy -in setting of sepsis.  -he is oriented currently but losing track of what day it is Plan Treat sepsis Supportive care He is on chronic nortriptyline ->will need to watch for evidence of wd  History of diabetic neuropathy Plan Holding Nortriptyline, and Lyrica   AKI, with hyponatremia and anion gap metabolic acidosis  -suspect all 2/2 to hypoperfusion BUT w/ his hyperglycemia also could consider element of DKA Plan F/u beta hydroxybuteric acid  IV hydration  Holding diuretics holding antihypertensives Strict intake output Arterial blood gas. Follow-up blood chemistry Ensure mean arterial pressure greater than 65 Hold ace-I  Type 2 diabetes with severe hyperglycemia Plan Follow-up hemoglobin A1c Follow-up beta hydroxybutyric acid Hypoglycemia protocol,  History of atrial fibrillation on Xarelto Has not had his medication for about 48 hours Plan Admit to ICU Holding Amiodarone for now as he is in sinus Hold Lopressor for now given hypotension Will likely need to place him on heparin postoperative  History of hypothyroidism Plan Continue Synthroid  History of diabetic foot ulcers and chronic venous stasis disease Plan Wound ostomy consulted  Best practice (evaluated daily)  Diet: N.p.o. Pain/Anxiety/Delirium protocol (if indicated): Not indicated but will likely need VAP protocol (if indicated): N.p.o. currently DVT prophylaxis: Going to the OR, will need to determine subcutaneous versus IV dosing pending surgery GI prophylaxis: PPI Glucose control: Hypoglycemia  protocol Mobility: Bedrest, at baseline is ambulatory Disposition: Going to the operating room stat  Goals of Care:  Last date of multidisciplinary goals of care discussion: Pending Family and staff present: Wife was at bedside, both indicating full scope of care Summary of discussion: Going to operating room Follow up goals of care discussion due: Pending Code Status: Full code  Labs   CBC: Recent Labs  Lab 06/10/20 0244  WBC 17.6*  NEUTROABS 15.4*  HGB 12.3*  HCT 38.5*  MCV 88.3  PLT 199    Basic Metabolic Panel: Recent Labs  Lab 06/10/20 0244  NA 128*  K 4.3  CL 94*  CO2 18*  GLUCOSE 362*  BUN 36*  CREATININE 3.56*  CALCIUM 8.4*   GFR: Estimated Creatinine Clearance: 56.4 mL/min (A) (by C-G formula based on SCr of 3.56 mg/dL (H)). Recent Labs  Lab 06/10/20 0244  WBC 17.6*  LATICACIDVEN 3.7*    Liver Function Tests: Recent Labs  Lab 06/10/20 0244  AST 25  ALT 23  ALKPHOS 87  BILITOT 1.3*  PROT 6.3*  ALBUMIN 2.0*   No results for input(s): LIPASE, AMYLASE in the last 168 hours. No results for input(s): AMMONIA in the last 168 hours.  ABG No results found for: PHART, PCO2ART, PO2ART, HCO3, TCO2, ACIDBASEDEF, O2SAT   Coagulation Profile: No results for input(s): INR, PROTIME in the last 168 hours.  Cardiac Enzymes: No results for input(s): CKTOTAL, CKMB, CKMBINDEX, TROPONINI in the last 168 hours.  HbA1C: No results found for: HGBA1C  CBG: Recent Labs  Lab 06/10/20 0239 06/10/20 1044  GLUCAP  353* 260*    Review of Systems:   Review of Systems  Constitutional: Positive for fever and malaise/fatigue.  HENT: Negative.   Eyes: Negative.   Respiratory: Negative.   Cardiovascular: Negative.   Gastrointestinal: Negative.   Genitourinary: Negative.   Musculoskeletal: Positive for myalgias.  Skin: Positive for rash.  Neurological: Negative.   Endo/Heme/Allergies: Negative.   Psychiatric/Behavioral: Negative.     Past Medical  History:  He,  has a past medical history of Asthma, Atrial fibrillation (HCC), Diabetes mellitus without complication (HCC), History of cardioversion, and Hypertension.   Surgical History:   Past Surgical History:  Procedure Laterality Date  . ABDOMINAL SURGERY    . LAPAROSCOPIC GASTRIC SLEEVE RESECTION       Social History:   reports that he has been smoking. He has never used smokeless tobacco. He reports previous alcohol use. He reports that he does not use drugs.   Family History:  His family history is not on file.   Allergies Allergies  Allergen Reactions  . Bee Venom Anaphylaxis    Other reaction(s): Unknown Other reaction(s): Unknown Other reaction(s): Unknown   . Penicillins Hives and Anaphylaxis    unknown   . Codeine Other (See Comments)    Pt states that his mother told him he is allergic, but has taken this medication multiple ties with no problems.    . Other Hives     Home Medications  Prior to Admission medications   Medication Sig Start Date End Date Taking? Authorizing Provider  albuterol (VENTOLIN HFA) 108 (90 Base) MCG/ACT inhaler Inhale 2 puffs into the lungs 4 (four) times daily as needed for shortness of breath or wheezing. 12/06/13  Yes [provider]  amiodarone (PACERONE) 200 MG tablet Take 1 tablet by mouth 2 (two) times daily. 01/17/15  Yes [provider]  APPLE CIDER VINEGAR PO Take 1 tablet by mouth daily.   Yes [provider]  atorvastatin (LIPITOR) 20 MG tablet Take 20 mg by mouth every evening. 04/11/19  Yes [provider]  Beta Carotene (VITAMIN A) 25000 UNIT capsule Take 25,000 Units by mouth daily.   Yes [provider]  Cholecalciferol 50 MCG (2000 UT) CAPS Take 2,000 Units by mouth daily. 06/02/19  Yes [provider]  dapagliflozin propanediol (FARXIGA) 5 MG TABS tablet Take 5 mg by mouth daily. 04/03/20  Yes [provider]  ezetimibe (ZETIA) 10 MG tablet Take 10 mg by  mouth daily. 04/05/20  Yes [provider]  levothyroxine (SYNTHROID) 75 MCG tablet Take 75 mcg by mouth daily. 04/03/20  Yes [provider]  liraglutide (VICTOZA) 18 MG/3ML SOPN Inject 1.8 mLs into the skin every evening. 04/28/14  Yes [provider]  lisinopril (ZESTRIL) 10 MG tablet Take 10 mg by mouth daily. 04/05/20  Yes [provider]  metFORMIN (GLUCOPHAGE) 500 MG tablet Take 500 mg by mouth in the morning and at bedtime. 04/03/20  Yes [provider]  metoprolol tartrate (LOPRESSOR) 100 MG tablet Take 100 mg by mouth 2 (two) times daily. 04/05/20  Yes [provider]  Multiple Vitamins-Minerals (ALIVE MENS ENERGY PO) Take 1 tablet by mouth daily.   Yes [provider]  nortriptyline (PAMELOR) 25 MG capsule Take 25 mg by mouth in the morning and at bedtime. 03/13/20  Yes [provider]  nystatin (MYCOSTATIN/NYSTOP) powder Apply 1 application topically daily as needed (rash). 04/11/19  Yes [provider]  Omega-3 Fatty Acids (FISH OIL) 1000 MG CAPS Take  1,000 mg by mouth daily.   Yes [provider]  omeprazole (PRILOSEC) 20 MG capsule Take 20 mg by mouth in the morning and at bedtime. 03/25/20  Yes [provider]  polyethylene glycol powder (GLYCOLAX/MIRALAX) 17 GM/SCOOP powder Take 17 g by mouth daily as needed for mild constipation.   Yes [provider]  pregabalin (LYRICA) 300 MG capsule Take 300 mg by mouth in the morning and at bedtime. 03/13/20  Yes [provider]  XARELTO 20 MG TABS tablet Take 20 mg by mouth daily. 04/19/20  Yes [provider]  Zinc 220 (50 Zn) MG CAPS Take 220 mg by mouth daily.   Yes [provider]     Critical care time: 34 min      Simonne Martinet ACNP-BC [facility] Pulmonary/Critical Care Pager # [phone] OR # [phone] if no answer

## 2020-06-10 NOTE — ED Triage Notes (Signed)
Pt coming from home with EMS after calling out for blood sugar issues. Pt on multiple oral medications (not insulin) and has had readings of 280-400 for past several days. Pt also c/o HA and dizziness and cannot ambulate like he normally does. AAOx4.

## 2020-06-10 NOTE — Transfer of Care (Signed)
Immediate Anesthesia Transfer of Care Note  Patient: Carl Hill  Procedure(s) Performed: IRRIGATION AND DEBRIDEMENT ABSCESS (N/A )  Patient Location: ICU  Anesthesia Type:General  Level of Consciousness: Patient remains intubated per anesthesia plan  Airway & Oxygen Therapy: Patient remains intubated per anesthesia plan and Patient placed on Ventilator (see vital sign flow sheet for setting)  Post-op Assessment: Report given to RN and Post -op Vital signs reviewed and stable  Post vital signs: Reviewed and stable  Last Vitals:  Vitals Value Taken Time  BP    Temp    Pulse    Resp    SpO2      Last Pain:  Vitals:   06/10/20 1045  TempSrc: Oral         Complications: No complications documented.

## 2020-06-10 NOTE — Anesthesia Procedure Notes (Signed)
Procedure Name: Intubation Date/Time: 06/10/2020 12:15 PM Performed by: Modena Morrow, CRNA Pre-anesthesia Checklist: Patient identified, Emergency Drugs available, Suction available and Patient being monitored Patient Re-evaluated:Patient Re-evaluated prior to induction Oxygen Delivery Method: Circle system utilized Preoxygenation: Pre-oxygenation with 100% oxygen Induction Type: IV induction Ventilation: Mask ventilation without difficulty Laryngoscope Size: Glidescope and 4 Grade View: Grade I Tube type: Oral Tube size: 7.5 mm Number of attempts: 1 Airway Equipment and Method: Stylet and Oral airway Placement Confirmation: ETT inserted through vocal cords under direct vision,  positive ETCO2 and breath sounds checked- equal and bilateral Secured at: 23 cm Tube secured with: Tape Dental Injury: Teeth and Oropharynx as per pre-operative assessment

## 2020-06-10 NOTE — Progress Notes (Signed)
Inpatient Diabetes Program Recommendations  AACE/ADA: New Consensus Statement on Inpatient Glycemic Control (2015)  Target Ranges:  Prepandial:   less than 140 mg/dL      Peak postprandial:   less than 180 mg/dL (1-2 hours)      Critically ill patients:  140 - 180 mg/dL   Lab Results  Component Value Date   GLUCAP 260 (H) 06/10/2020    Review of Glycemic Control Results for CALAB, SACHSE (MRN [id number]) as of 06/10/2020 10:54  Ref. Range 06/10/2020 02:39 06/10/2020 10:44  Glucose-Capillary Latest Ref Range: 70 - 99 mg/dL 211 (H) 941 (H)   Diabetes history: DM 2 Outpatient Diabetes medications:  Farxiga 5 mg daily, Metformin 500 mg bid, Victoza 1.8 mg q PM Current orders for Inpatient glycemic control:  IV insulin- DKA order set  Inpatient Diabetes Program Recommendations:    Agree with orders.  Note that patient admitted with Necrotizing soft tissue infection awaiting debridement.   Labs indicate low CO2=18 and increased Anion gap=16.    Of note patient was on SGLT-2 prior to admit which can increase risk for "necrotizing infections of the perineum".  Recommend d/c of SGLT-2/Farxiga from DM medications.  Also patient may need added dextrose to help clear acidosis due to SGLT-2.  Will follow.   Thanks  Beryl Meager, RN, BC-ADM Inpatient Diabetes Coordinator Pager [phone] (8a-5p)

## 2020-06-10 NOTE — Progress Notes (Signed)
Pharmacy Antibiotic Note  Carl Hill is a 50 y.o. male admitted on 06/10/2020 with necrotizing soft tissue infection of L buttock.  Pharmacy has been consulted for vancomycin/aztreonam dosing. Patient with history of "hives, anaphylaxis" allergy to PCN, no history of beta lactam use documented in Epic. SCr 3.56 on admit (no baseline available).  Plan: Aztreonam 2g IV q8h Flagyl 500mg  PO q8h Vancomycin 2500 x 1; then f/u renal function trend to enter further maintenance doses Monitor clinical progress, c/s, renal function F/u de-escalation plan/LOT, vancomycin levels as indicated    Height: 6\' 5"  (195.6 cm) Weight: (!) 259.2 kg (571 lb 6.9 oz) (weighed pt on strecher it was zero before pt got on) IBW/kg (Calculated) : 89.1  Temp (24hrs), Avg:98.2 F (36.8 C), Min:97.8 F (36.6 C), Max:99.1 F (37.3 C)  Recent Labs  Lab 06/10/20 0244  WBC 17.6*  CREATININE 3.56*  LATICACIDVEN 3.7*    Estimated Creatinine Clearance: 56.4 mL/min (A) (by C-G formula based on SCr of 3.56 mg/dL (H)).    Allergies  Allergen Reactions  . Bee Venom Anaphylaxis    Other reaction(s): Unknown Other reaction(s): Unknown Other reaction(s): Unknown   . Penicillins Hives and Anaphylaxis    unknown   . Codeine Other (See Comments)    Pt states that his mother told him he is allergic, but has taken this medication multiple ties with no problems.    . Other Hives    , PharmD, BCPS Please check AMION for all [facility] contact numbers Clinical Pharmacist 06/10/2020 10:30 AM

## 2020-06-10 NOTE — Anesthesia Preprocedure Evaluation (Signed)
Anesthesia Evaluation  Patient identified by MRN, date of birth, ID band Patient awake    Reviewed: Allergy & Precautions, H&P , NPO status , Patient's Chart, lab work & pertinent test results  Airway Mallampati: II  TM Distance: >3 FB Neck ROM: Full    Dental no notable dental hx.    Pulmonary Current Smoker,    breath sounds clear to auscultation + decreased breath sounds      Cardiovascular hypertension, Normal cardiovascular exam+ dysrhythmias Atrial Fibrillation  Rhythm:Regular Rate:Normal     Neuro/Psych negative neurological ROS  negative psych ROS   GI/Hepatic negative GI ROS, Neg liver ROS,   Endo/Other  diabetes, Poorly ControlledMorbid obesity  Renal/GU Renal InsufficiencyRenal disease  negative genitourinary   Musculoskeletal negative musculoskeletal ROS (+)   Abdominal (+) + obese,   Peds negative pediatric ROS (+)  Hematology negative hematology ROS (+)   Anesthesia Other Findings   Reproductive/Obstetrics negative OB ROS                             Anesthesia Physical Anesthesia Plan  ASA: V  Anesthesia Plan: General   Post-op Pain Management:    Induction: Intravenous  PONV Risk Score and Plan: 0  Airway Management Planned: Oral ETT and Video Laryngoscope Planned  Additional Equipment:   Intra-op Plan:   Post-operative Plan: Possible Post-op intubation/ventilation  Informed Consent: I have reviewed the patients History and Physical, chart, labs and discussed the procedure including the risks, benefits and alternatives for the proposed anesthesia with the patient or authorized representative who has indicated his/her understanding and acceptance.     Dental advisory given  Plan Discussed with: CRNA and Surgeon  Anesthesia Plan Comments:         Anesthesia Quick Evaluation

## 2020-06-10 NOTE — ED Provider Notes (Addendum)
[facility] EMERGENCY DEPARTMENT Provider Note   CSN: [id number] Arrival date & time: 06/10/20  0159     History Chief Complaint  Patient presents with  . Hyperglycemia    Carl Hill is a 50 y.o. male.  Patient with PMH of asthma, afib, DM, HTN presents to the ED with a chief complaint of hyperglycemia and generalized weakness.  He states that he is normally fairly unstable on his feet due to diabetic neuropathy, but is generally able to perform his ADLs.  Tonight this changed and he didn't have the strength to stand or go to the bathroom.  He states that his blood sugars have been poorly controlled on oral meds.  He reports that he has had intermittent subjective fevers, but that his thermometer is broken.  He complains of a rash on his chest and a draining abscess on his buttocks.  He also complains of a healing abrasion on his leg.  He denies any cough, CP, or SOB.    The history is provided by the patient. No language interpreter was used.       Past Medical History:  Diagnosis Date  . Asthma   . Atrial fibrillation (HCC)   . Diabetes mellitus without complication (HCC)   . History of cardioversion    3 times   . Hypertension     There are no problems to display for this patient.   Past Surgical History:  Procedure Laterality Date  . ABDOMINAL SURGERY    . LAPAROSCOPIC GASTRIC SLEEVE RESECTION         History reviewed. No pertinent family history.  Social History   Tobacco Use  . Smoking status: Current Every Day Smoker  . Smokeless tobacco: Never Used  . Tobacco comment: 1 cigarette per day  Vaping Use  . Vaping Use: Never used  Substance Use Topics  . Alcohol use: Not Currently    Comment: Has not had a drink in 1 year  . Drug use: Never    Home Medications Prior to Admission medications   Medication Sig Start Date End Date Taking? Authorizing Provider  albuterol (VENTOLIN HFA) 108 (90 Base) MCG/ACT inhaler Inhale 2 puffs  into the lungs 4 (four) times daily as needed for shortness of breath or wheezing. 12/06/13  Yes [provider]  amiodarone (PACERONE) 200 MG tablet Take 1 tablet by mouth 2 (two) times daily. 01/17/15  Yes [provider]  atorvastatin (LIPITOR) 20 MG tablet Take by mouth. 04/11/19  Yes [provider]  canagliflozin (INVOKANA) 300 MG TABS tablet Take 300 mg by mouth daily. 04/27/14  Yes [provider]  Cholecalciferol 50 MCG (2000 UT) CAPS Take 2,000 Units by mouth daily. 06/02/19  Yes [provider]  dapagliflozin propanediol (FARXIGA) 5 MG TABS tablet Take 5 mg by mouth daily. 04/03/20  Yes [provider]  levothyroxine (SYNTHROID) 75 MCG tablet Take 75 mcg by mouth daily. 04/03/20  Yes [provider]  liraglutide (VICTOZA) 18 MG/3ML SOPN Inject 0.3 mLs into the skin every evening. 04/28/14  Yes [provider]  metFORMIN (GLUCOPHAGE) 500 MG tablet Take 500 mg by mouth in the morning and at bedtime. 04/03/20  Yes [provider]  nortriptyline (PAMELOR) 25 MG capsule Take 25 mg by mouth in the morning and at bedtime. 03/13/20  Yes [provider]  nystatin (NYSTATIN) powder Apply 1 application topically daily. 04/11/19  Yes [provider]  omeprazole (PRILOSEC) 20 MG capsule Take 20 mg  by mouth in the morning and at bedtime. 03/25/20  Yes [provider]  pregabalin (LYRICA) 300 MG capsule Take 300 mg by mouth in the morning and at bedtime. 03/13/20  Yes [provider]  ezetimibe (ZETIA) 10 MG tablet Take 10 mg by mouth daily. 04/05/20   [provider]  lisinopril (ZESTRIL) 10 MG tablet Take 10 mg by mouth daily. 04/05/20   [provider]  metoprolol succinate (TOPROL-XL) 50 MG 24 hr tablet Take 50 mg by mouth daily. 03/08/20   [provider]  metoprolol tartrate (LOPRESSOR) 100 MG tablet Take 100 mg by mouth 2 (two) times daily. 04/05/20   [provider]  Omega-3 Fatty Acids (FISH OIL) 1000 MG CAPS Take 1,000 mg by mouth daily.    [provider]  polyethylene glycol powder (GLYCOLAX/MIRALAX) 17 GM/SCOOP powder Take 17 g by mouth daily as needed.    [provider]  XARELTO 20 MG TABS tablet Take 20 mg by mouth daily. 04/19/20   [provider]    Allergies    Bee venom, Penicillins, Codeine, and Other  Review of Systems   Review of Systems  All other systems reviewed and are negative.   Physical Exam Updated Vital Signs BP (!) 97/44 (BP Location: Left Wrist)   Pulse (!) 101   Temp 97.8 F (36.6 C) (Oral)   Resp 19   Wt (!) 259.2 kg Comment: weighed pt on strecher it was zero before pt got on  SpO2 96%   Physical Exam Vitals and nursing note reviewed.  Constitutional:      Appearance: He is well-developed and well-nourished. He is obese.  HENT:     Head: Normocephalic and atraumatic.  Eyes:     Conjunctiva/sclera: Conjunctivae normal.  Cardiovascular:     Rate and Rhythm: Regular rhythm. Tachycardia present.     Heart sounds: No murmur heard.   Pulmonary:     Effort: Pulmonary effort is normal. No respiratory distress.     Breath sounds: Normal breath sounds.  Abdominal:     Palpations: Abdomen is soft.     Tenderness: There is no abdominal tenderness.  Genitourinary:    Comments: Moderate erythema open wound of left buttocks, no evidence for perirectal abscess, no purulent fluid is able to be expressed during my exam Musculoskeletal:        General: No edema. Normal range of motion.     Cervical back: Neck supple.  Skin:    General: Skin is warm and dry.     Comments: Chronic appearing venous stasis ulcers on left lower extremity  Neurological:     Mental Status: He is alert and oriented to person, place, and time.  Psychiatric:        Mood and Affect: Mood and affect and mood normal.        Behavior: Behavior normal.     ED Results / Procedures / Treatments    Labs (all labs ordered are listed, but only abnormal results are displayed) Labs Reviewed  CBG MONITORING, ED - Abnormal; Notable for the following components:      Result Value   Glucose-Capillary 353 (*)    All other components within normal limits  SARS CORONAVIRUS 2 (TAT 6-24 HRS)  LACTIC ACID, PLASMA  CBC WITH DIFFERENTIAL/PLATELET  COMPREHENSIVE METABOLIC PANEL  URINALYSIS, ROUTINE W REFLEX MICROSCOPIC    EKG None  Radiology No results found.  Procedures .Critical Care Performed by: Roxy Horseman, PA-C Authorized by: Roxy Horseman,  PA-C   Critical care provider statement:    Critical care time (minutes):  55   Critical care was necessary to treat or prevent imminent or life-threatening deterioration of the following conditions:  Sepsis, renal failure and metabolic crisis   Critical care was time spent personally by me on the following activities:  Discussions with consultants, evaluation of patient's response to treatment, examination of patient, ordering and performing treatments and interventions, ordering and review of laboratory studies, ordering and review of radiographic studies, pulse oximetry, re-evaluation of patient's condition, obtaining history from patient or surrogate and review of old charts   (including critical care time)  Medications Ordered in ED Medications  sodium chloride 0.9 % bolus 1,000 mL (has no administration in time range)    ED Course  I have reviewed the triage vital signs and the nursing notes.  Pertinent labs & imaging results that were available during my care of the patient were reviewed by me and considered in my medical decision making (see chart for details).    MDM Rules/Calculators/A&P                          This patient complains of generalized weakness and hyperglycemia, this involves an extensive number of treatment options, and is a complaint that carries with it a high risk of complications and morbidity.     Differential Dx Covid, hyperglycemia, DKA, infection  Pertinent Labs I ordered, reviewed, and interpreted labs, which included CBC, CMP, lactate, notable for leukocytosis to 17.6, creatinine is 3.56, most recent comparison is from October 2021, where creatinine was 0.89, lactic acid 3.7, Covid is pending.  Upon return of labs, code sepsis was activated.    Imaging Interpretation I ordered imaging studies which included chest x-ray.  I independently visualized and interpreted the chest x-ray, which showed streaky atelectasis, questionable atypical infection.   Medications I ordered medication broad-spectrum antibiotics, fluids for sepsis.  Sources Previous records obtained and reviewed Cr from 03/25/20 was 0.89, now 3.56.     Critical Interventions  Fluids, antibiotics  Reassessments After the interventions stated above, I reevaluated the patient and found his condition unchanged.  Still alert and oriented.  No respiratory distress.  Will need admission to the hospital.  Consultants Dr. Toniann Fail - who is appreciated for admitting.  Consider getting CT pelvis, but Dr. Toniann Fail recommends getting fluids on board first.  CT tech tells me that patient exceeds max weight limit for CT table.  Plan Admit    Final Clinical Impression(s) / ED Diagnoses Final diagnoses:  Sepsis, due to unspecified organism, unspecified whether acute organ dysfunction present [facility])  AKI (acute kidney injury) (HCC)  Hyperglycemia    Rx / DC Orders ED Discharge Orders    None       Roxy Horseman, PA-C 06/10/20 0430    Roxy Horseman, PA-C 06/10/20 6063    Dione Booze, MD 06/10/20 [phone]

## 2020-06-10 NOTE — Progress Notes (Signed)
Elink Code Sepsis note:  Secure chat sent to ED RN about repeat LA, pt only had first and was finishing a 3,000 ml IVF bolus. First LA was 3.7, need repeat.  Mathilda Maguire eLink RN

## 2020-06-10 NOTE — Progress Notes (Signed)
PCCM INTERVAL PROGRESS NOTE   Called to bedside for patient decompensation.  Upon my arrival patient with SBP in 80s by Art line. AF RVR on monitor rates 160. Recent ABG with pH 7.12. 2 amps bicarb given with only transient rise in BP. Decision was made to cardiovert as BP fell further. Synchronized cardioversion done at 50 joules. Patient then went into monomorphic VT. CPR started and the patient was promptly defibrillated at 200 joules with ROSC.     Joneen Roach, AGACNP-BC [facility]  See Amion for personal pager PCCM on call pager [phone]  06/10/2020 5:39 PM

## 2020-06-10 NOTE — ED Notes (Signed)
Date and time results received: 06/10/20 0401  Test: Lactic Acid Critical Value: 3.7  Name of Provider Notified: MD GLick Orders Received? Or Actions Taken?: Awaiting Orders

## 2020-06-10 NOTE — Anesthesia Procedure Notes (Addendum)
Central Venous Catheter Insertion Performed by: Eilene Ghazi, MD, anesthesiologist Start/End1/03/2021 1:38 PM, 06/10/2020 1:50 PM Patient location: Pre-op. Preanesthetic checklist: patient identified, IV checked, site marked, risks and benefits discussed, surgical consent, monitors and equipment checked, pre-op evaluation, timeout performed and anesthesia consent Position: Trendelenburg Patient sedated Hand hygiene performed , maximum sterile barriers used  and Seldinger technique used Catheter size: 8 Fr Total catheter length 16. Central line was placed.Double lumen Procedure performed using ultrasound guided technique. Ultrasound Notes:anatomy identified, needle tip was noted to be adjacent to the nerve/plexus identified, no ultrasound evidence of intravascular and/or intraneural injection and image(s) printed for medical record Attempts: 1 Following insertion, dressing applied, line sutured and Biopatch. Post procedure assessment: blood return through all ports, free fluid flow and no air  Patient tolerated the procedure well with no immediate complications.

## 2020-06-10 NOTE — ED Notes (Signed)
Patient transported to CT 

## 2020-06-11 ENCOUNTER — Inpatient Hospital Stay (HOSPITAL_COMMUNITY): Payer: Medicaid Other | Admitting: Anesthesiology

## 2020-06-11 ENCOUNTER — Encounter (HOSPITAL_COMMUNITY): Payer: Self-pay | Admitting: Anesthesiology

## 2020-06-11 ENCOUNTER — Encounter (HOSPITAL_COMMUNITY)
Admission: EM | Disposition: A | Discharge status: Rehabilitation Facility | Payer: Self-pay | Source: Home / Self Care | Attending: Pulmonary Disease

## 2020-06-11 DIAGNOSIS — R6521 Severe sepsis with septic shock: Secondary | ICD-10-CM

## 2020-06-11 DIAGNOSIS — A419 Sepsis, unspecified organism: Secondary | ICD-10-CM | POA: Diagnosis not present

## 2020-06-11 HISTORY — PX: INCISION AND DRAINAGE PERIRECTAL ABSCESS: SHX1804

## 2020-06-11 LAB — GLUCOSE, CAPILLARY
Glucose-Capillary: 150 mg/dL — ABNORMAL HIGH (ref 70–99)
Glucose-Capillary: 165 mg/dL — ABNORMAL HIGH (ref 70–99)
Glucose-Capillary: 169 mg/dL — ABNORMAL HIGH (ref 70–99)
Glucose-Capillary: 175 mg/dL — ABNORMAL HIGH (ref 70–99)
Glucose-Capillary: 175 mg/dL — ABNORMAL HIGH (ref 70–99)
Glucose-Capillary: 178 mg/dL — ABNORMAL HIGH (ref 70–99)
Glucose-Capillary: 181 mg/dL — ABNORMAL HIGH (ref 70–99)
Glucose-Capillary: 183 mg/dL — ABNORMAL HIGH (ref 70–99)
Glucose-Capillary: 183 mg/dL — ABNORMAL HIGH (ref 70–99)
Glucose-Capillary: 190 mg/dL — ABNORMAL HIGH (ref 70–99)
Glucose-Capillary: 202 mg/dL — ABNORMAL HIGH (ref 70–99)
Glucose-Capillary: 211 mg/dL — ABNORMAL HIGH (ref 70–99)
Glucose-Capillary: 227 mg/dL — ABNORMAL HIGH (ref 70–99)
Glucose-Capillary: 232 mg/dL — ABNORMAL HIGH (ref 70–99)
Glucose-Capillary: 249 mg/dL — ABNORMAL HIGH (ref 70–99)
Glucose-Capillary: 280 mg/dL — ABNORMAL HIGH (ref 70–99)
Glucose-Capillary: 305 mg/dL — ABNORMAL HIGH (ref 70–99)
Glucose-Capillary: 337 mg/dL — ABNORMAL HIGH (ref 70–99)
Glucose-Capillary: 372 mg/dL — ABNORMAL HIGH (ref 70–99)
Glucose-Capillary: 417 mg/dL — ABNORMAL HIGH (ref 70–99)
Glucose-Capillary: 436 mg/dL — ABNORMAL HIGH (ref 70–99)

## 2020-06-11 LAB — POCT I-STAT 7, (LYTES, BLD GAS, ICA,H+H)
Acid-base deficit: 2 mmol/L (ref 0.0–2.0)
Acid-base deficit: 2 mmol/L (ref 0.0–2.0)
Acid-base deficit: 4 mmol/L — ABNORMAL HIGH (ref 0.0–2.0)
Bicarbonate: 22.8 mmol/L (ref 20.0–28.0)
Bicarbonate: 23.1 mmol/L (ref 20.0–28.0)
Bicarbonate: 23.5 mmol/L (ref 20.0–28.0)
Calcium, Ion: 1.02 mmol/L — ABNORMAL LOW (ref 1.15–1.40)
Calcium, Ion: 1.05 mmol/L — ABNORMAL LOW (ref 1.15–1.40)
Calcium, Ion: 1.05 mmol/L — ABNORMAL LOW (ref 1.15–1.40)
HCT: 30 % — ABNORMAL LOW (ref 39.0–52.0)
HCT: 31 % — ABNORMAL LOW (ref 39.0–52.0)
HCT: 35 % — ABNORMAL LOW (ref 39.0–52.0)
Hemoglobin: 10.2 g/dL — ABNORMAL LOW (ref 13.0–17.0)
Hemoglobin: 10.5 g/dL — ABNORMAL LOW (ref 13.0–17.0)
Hemoglobin: 11.9 g/dL — ABNORMAL LOW (ref 13.0–17.0)
O2 Saturation: 97 %
O2 Saturation: 97 %
O2 Saturation: 99 %
Patient temperature: 101.2
Patient temperature: 105
Patient temperature: 40
Potassium: 3.6 mmol/L (ref 3.5–5.1)
Potassium: 3.9 mmol/L (ref 3.5–5.1)
Potassium: 4.7 mmol/L (ref 3.5–5.1)
Sodium: 131 mmol/L — ABNORMAL LOW (ref 135–145)
Sodium: 135 mmol/L (ref 135–145)
Sodium: 135 mmol/L (ref 135–145)
TCO2: 24 mmol/L (ref 22–32)
TCO2: 24 mmol/L (ref 22–32)
TCO2: 25 mmol/L (ref 22–32)
pCO2 arterial: 44.2 mmHg (ref 32.0–48.0)
pCO2 arterial: 47.5 mmHg (ref 32.0–48.0)
pCO2 arterial: 47.8 mmHg (ref 32.0–48.0)
pH, Arterial: 7.292 — ABNORMAL LOW (ref 7.350–7.450)
pH, Arterial: 7.319 — ABNORMAL LOW (ref 7.350–7.450)
pH, Arterial: 7.339 — ABNORMAL LOW (ref 7.350–7.450)
pO2, Arterial: 116 mmHg — ABNORMAL HIGH (ref 83.0–108.0)
pO2, Arterial: 122 mmHg — ABNORMAL HIGH (ref 83.0–108.0)
pO2, Arterial: 160 mmHg — ABNORMAL HIGH (ref 83.0–108.0)

## 2020-06-11 LAB — CBC
HCT: 30.8 % — ABNORMAL LOW (ref 39.0–52.0)
Hemoglobin: 10.2 g/dL — ABNORMAL LOW (ref 13.0–17.0)
MCH: 28.8 pg (ref 26.0–34.0)
MCHC: 33.1 g/dL (ref 30.0–36.0)
MCV: 87 fL (ref 80.0–100.0)
Platelets: 200 10*3/uL (ref 150–400)
RBC: 3.54 MIL/uL — ABNORMAL LOW (ref 4.22–5.81)
RDW: 15.2 % (ref 11.5–15.5)
WBC: 10.9 10*3/uL — ABNORMAL HIGH (ref 4.0–10.5)
nRBC: 1.2 % — ABNORMAL HIGH (ref 0.0–0.2)

## 2020-06-11 LAB — BASIC METABOLIC PANEL
Anion gap: 9 (ref 5–15)
Anion gap: 13 (ref 5–15)
BUN: 41 mg/dL — ABNORMAL HIGH (ref 6–20)
BUN: 44 mg/dL — ABNORMAL HIGH (ref 6–20)
CO2: 22 mmol/L (ref 22–32)
CO2: 21 mmol/L — ABNORMAL LOW (ref 22–32)
Calcium: 7.4 mg/dL — ABNORMAL LOW (ref 8.9–10.3)
Calcium: 7.3 mg/dL — ABNORMAL LOW (ref 8.9–10.3)
Chloride: 99 mmol/L (ref 98–111)
Chloride: 98 mmol/L (ref 98–111)
Creatinine, Ser: 3.39 mg/dL — ABNORMAL HIGH (ref 0.61–1.24)
Creatinine, Ser: 3.63 mg/dL — ABNORMAL HIGH (ref 0.61–1.24)
GFR, Estimated: 21 mL/min — ABNORMAL LOW (ref >60)
GFR, Estimated: 20 mL/min — ABNORMAL LOW (ref >60)
Glucose, Bld: 316 mg/dL — ABNORMAL HIGH (ref 70–99)
Glucose, Bld: 202 mg/dL — ABNORMAL HIGH (ref 70–99)
Potassium: 4.4 mmol/L (ref 3.5–5.1)
Potassium: 4.1 mmol/L (ref 3.5–5.1)
Sodium: 130 mmol/L — ABNORMAL LOW (ref 135–145)
Sodium: 132 mmol/L — ABNORMAL LOW (ref 135–145)

## 2020-06-11 LAB — BETA-HYDROXYBUTYRIC ACID
Beta-Hydroxybutyric Acid: 0.47 mmol/L — ABNORMAL HIGH (ref 0.05–0.27)
Beta-Hydroxybutyric Acid: 0.36 mmol/L — ABNORMAL HIGH (ref 0.05–0.27)

## 2020-06-11 LAB — LACTIC ACID, PLASMA: Lactic Acid, Venous: 1.4 mmol/L (ref 0.5–1.9)

## 2020-06-11 LAB — VANCOMYCIN, RANDOM: Vancomycin Rm: 9

## 2020-06-11 LAB — TRIGLYCERIDES: Triglycerides: 127 mg/dL (ref <150)

## 2020-06-11 SURGERY — INCISION AND DRAINAGE, ABSCESS, PERIRECTAL
Anesthesia: General

## 2020-06-11 MED ORDER — LACTATED RINGERS IV SOLN: INTRAVENOUS | Status: DC

## 2020-06-11 MED ORDER — DEXTROSE 5 % IV SOLN: INTRAVENOUS | Status: DC

## 2020-06-11 MED ORDER — NOREPINEPHRINE 16 MG/250ML-% IV SOLN
0.0000 ug/min | INTRAVENOUS | Status: DC
  Administered 2020-06-11: 30 ug/min via INTRAVENOUS
  Administered 2020-06-11: 26 ug/min via INTRAVENOUS
  Administered 2020-06-12: 16 ug/min via INTRAVENOUS
  Administered 2020-06-12: 22 ug/min via INTRAVENOUS
  Administered 2020-06-13: 11 ug/min via INTRAVENOUS
  Filled 2020-06-11 (×7): qty 250

## 2020-06-11 MED ORDER — MIDAZOLAM HCL 2 MG/2ML IJ SOLN
INTRAMUSCULAR | Status: DC | PRN
  Administered 2020-06-11: 2 mg via INTRAVENOUS

## 2020-06-11 MED ORDER — DEXTROSE-NACL 5-0.9 % IV SOLN: INTRAVENOUS | Status: DC

## 2020-06-11 MED ORDER — VASOPRESSIN 20 UNIT/ML IV SOLN
INTRAVENOUS | Status: AC
  Filled 2020-06-11: qty 1

## 2020-06-11 MED ORDER — 0.9 % SODIUM CHLORIDE (POUR BTL) OPTIME
TOPICAL | Status: DC | PRN
  Administered 2020-06-11 (×2): 1000 mL

## 2020-06-11 MED ORDER — LACTATED RINGERS IV BOLUS
1000.0000 mL | Freq: Once | INTRAVENOUS | Status: AC
  Administered 2020-06-11: 1000 mL via INTRAVENOUS

## 2020-06-11 MED ORDER — FENTANYL CITRATE (PF) 250 MCG/5ML IJ SOLN
INTRAMUSCULAR | Status: AC
  Filled 2020-06-11: qty 5

## 2020-06-11 MED ORDER — VANCOMYCIN HCL 2000 MG/400ML IV SOLN
2000.0000 mg | Freq: Once | INTRAVENOUS | Status: AC
  Administered 2020-06-11: 2000 mg via INTRAVENOUS
  Filled 2020-06-11: qty 400

## 2020-06-11 MED ORDER — ACETAMINOPHEN 160 MG/5ML PO SOLN
650.0000 mg | ORAL | Status: DC | PRN
  Administered 2020-06-11 – 2020-06-12 (×3): 650 mg
  Filled 2020-06-11 (×4): qty 20.3

## 2020-06-11 MED ORDER — ROCURONIUM BROMIDE 10 MG/ML (PF) SYRINGE
PREFILLED_SYRINGE | INTRAVENOUS | Status: DC | PRN
  Administered 2020-06-11: 30 mg via INTRAVENOUS
  Administered 2020-06-11: 50 mg via INTRAVENOUS

## 2020-06-11 MED ORDER — LACTATED RINGERS IV SOLN: INTRAVENOUS | Status: DC | PRN

## 2020-06-11 SURGICAL SUPPLY — 34 items
APPLIER CLIP 13 LRG OPEN (CLIP) ×2
APR CLP LRG 13 20 CLIP (CLIP) ×1
BNDG GAUZE ELAST 4 BULKY (GAUZE/BANDAGES/DRESSINGS) ×5 IMPLANT
CLIP APPLIE 13 LRG OPEN (CLIP) IMPLANT
COVER MAYO STAND STRL (DRAPES) ×2 IMPLANT
COVER SURGICAL LIGHT HANDLE (MISCELLANEOUS) ×2 IMPLANT
COVER WAND RF STERILE (DRAPES) ×1 IMPLANT
DRSG PAD ABDOMINAL 8X10 ST (GAUZE/BANDAGES/DRESSINGS) ×1 IMPLANT
ELECT CAUTERY BLADE 6.4 (BLADE) ×2 IMPLANT
ELECT REM PT RETURN 9FT ADLT (ELECTROSURGICAL) ×2
ELECTRODE REM PT RTRN 9FT ADLT (ELECTROSURGICAL) ×1 IMPLANT
GAUZE SPONGE 4X4 12PLY STRL (GAUZE/BANDAGES/DRESSINGS) IMPLANT
GLOVE BIOGEL PI IND STRL 6 (GLOVE) ×1 IMPLANT
GLOVE BIOGEL PI INDICATOR 6 (GLOVE) ×1
GLOVE BIOGEL PI MICRO 5.5 (GLOVE) ×1
GLOVE BIOGEL PI MICRO STRL 5.5 (GLOVE) ×1 IMPLANT
GOWN STRL REUS W/ TWL LRG LVL3 (GOWN DISPOSABLE) ×2 IMPLANT
GOWN STRL REUS W/TWL LRG LVL3 (GOWN DISPOSABLE) ×4
KIT BASIN OR (CUSTOM PROCEDURE TRAY) ×2 IMPLANT
KIT TURNOVER KIT B (KITS) ×2 IMPLANT
NS IRRIG 1000ML POUR BTL (IV SOLUTION) ×2 IMPLANT
PACK GENERAL/GYN (CUSTOM PROCEDURE TRAY) ×1 IMPLANT
PACK LITHOTOMY IV (CUSTOM PROCEDURE TRAY) IMPLANT
PAD ARMBOARD 7.5X6 YLW CONV (MISCELLANEOUS) ×2 IMPLANT
PENCIL SMOKE EVACUATOR (MISCELLANEOUS) ×2 IMPLANT
SOL PREP POV-IOD 4OZ 10% (MISCELLANEOUS) ×2 IMPLANT
SPONGE LAP 18X18 RF (DISPOSABLE) IMPLANT
SURGILUBE 2OZ TUBE FLIPTOP (MISCELLANEOUS) ×2 IMPLANT
SUT VIC AB 3-0 SH 8-18 (SUTURE) ×1 IMPLANT
SYR BULB EAR ULCER 3OZ GRN STR (SYRINGE) ×2 IMPLANT
TOWEL GREEN STERILE (TOWEL DISPOSABLE) ×2 IMPLANT
TOWEL GREEN STERILE FF (TOWEL DISPOSABLE) ×2 IMPLANT
TUBE CONNECTING 12X1/4 (SUCTIONS) ×2 IMPLANT
YANKAUER SUCT BULB TIP NO VENT (SUCTIONS) ×2 IMPLANT

## 2020-06-11 NOTE — Anesthesia Preprocedure Evaluation (Addendum)
Anesthesia Evaluation  Patient identified by MRN, date of birth, ID band Patient awake    Reviewed: Allergy & Precautions, NPO status , Patient's Chart, lab work & pertinent test results, reviewed documented beta blocker date and time   Airway Mallampati: Intubated       Dental   Pulmonary asthma , Current Smoker and Patient abstained from smoking.,  Patient currently intubated   breath sounds clear to auscultation + decreased breath sounds      Cardiovascular hypertension, Pt. on medications and Pt. on home beta blockers Normal cardiovascular exam+ dysrhythmias Atrial Fibrillation  Rhythm:Irregular Rate:Tachycardia  Hx/o Atrial fibrillation- on Pacerone, last dose 06/09/20   Neuro/Psych Chronic pain negative neurological ROS  negative psych ROS   GI/Hepatic Neg liver ROS, GERD  Medicated and Controlled,  Endo/Other  diabetes, Poorly Controlled, Type 2, Oral Hypoglycemic AgentsHypothyroidism Morbid obesityHyperlipidemia DKA resolving  Renal/GU ARFRenal diseaseAKI   Fournier's gangrene    Musculoskeletal Perineal wound Diabetic heel ulcer   Abdominal (+) + obese,   Peds  Hematology Xarelto therapy- last dose 06/09/20 Septic Shock   Anesthesia Other Findings   Reproductive/Obstetrics                           Anesthesia Physical Anesthesia Plan  ASA: IV and emergent  Anesthesia Plan: General   Post-op Pain Management:    Induction: Inhalational  PONV Risk Score and Plan:   Airway Management Planned: Oral ETT  Additional Equipment: Arterial line  Intra-op Plan:   Post-operative Plan: Post-operative intubation/ventilation  Informed Consent: I have reviewed the patients History and Physical, chart, labs and discussed the procedure including the risks, benefits and alternatives for the proposed anesthesia with the patient or authorized representative who has indicated his/her  understanding and acceptance.       Plan Discussed with: CRNA, Anesthesiologist and Surgeon  Anesthesia Plan Comments:         Anesthesia Quick Evaluation

## 2020-06-11 NOTE — Anesthesia Postprocedure Evaluation (Signed)
Anesthesia Post Note  Patient: Carl Hill  Procedure(s) Performed: IRRIGATION AND DEBRIDEMENT ABSCESS (N/A )     Patient location during evaluation: SICU Anesthesia Type: General Level of consciousness: sedated Pain management: pain level controlled Vital Signs Assessment: post-procedure vital signs reviewed and stable Respiratory status: patient remains intubated per anesthesia plan Cardiovascular status: stable Postop Assessment: no apparent nausea or vomiting Anesthetic complications: no   No complications documented.  Last Vitals:  Vitals:   06/11/20 0745 06/11/20 0752  BP: 110/67   Pulse: (!) 122   Resp: (!) 30   Temp:  (!) 40.2 C  SpO2: 98%     Last Pain:  Vitals:   06/11/20 0752  TempSrc: Oral                 Malajah Oceguera S

## 2020-06-11 NOTE — Progress Notes (Signed)
NAME:  Carl Hill, MRN:  [id number], DOB:  [birth year], LOS: 1 ADMISSION DATE:  06/10/2020, CONSULTATION DATE:  06/10/2020 REFERRING MD:  Ophelia Charter, CHIEF COMPLAINT:  Severe Sepsis    Brief History:  50 year old white male admitted with necrotizing fasciitis of the left buttock, left upper thigh, and perineum, with associated severe sepsis/septic shock. Critical care consulted  History of Present Illness:  This is a 50 year old chronically ill-appearing obese white male currently lying in bed, lethargic but not in acute distress.   Presented to the emergency room accompanied by his wife the a.m. of 1/10 for evaluation of hypoglycemia, Malaise and fatigue.  Patient had been in his usual state of health until approximately 1 week prior to presentation at which time his wife started to notice he had 2 boils on his left buttocks near the left gluteal fold.  She initially dressed these, after cleaning them.  The patient had progressive pain in the left buttocks, decreased activity, and then wife started noting more hyperglycemia with blood glucose as high as the 400s and because of this he presented to the emergency room.  On arrival to the emergency room he was found to be febrile, his blood glucose was in the 300s, white blood cell count over 17,000, he had a mild lactic acidosis of 3.6, he was tachycardic and hypotensive.  Cultures were sent, CT imaging obtained of the pelvis showing extensive subcutaneous emphysema in the medial aspect of the upper thigh extending throughout the perineum and left medial buttocks with gas extending into the pelvis surrounding the anus.  General surgery was consulted emergently broad-spectrum antibiotic started IV fluid administered critical care consulted and concern for progressive clinical decline  Past Medical History:  HTN, obesity, DM, afib on DOAC  Significant Hospital Events:  06/10/20: Admitted to [facility]. General surgery consulted and debridement pursued. In the  afternoon, patient developed hypotension with AF in RVR. Synchronized cardioversion unsuccessful with conversion to VT. CPR initated with ROSC after desynchronized defibrillation.  Consults:  General surgery, PCCM  Procedures:  06/10/2020: Excisional debridement   Significant Diagnostic Tests:  CT abdomen/pelvis:   Micro Data:  Blood cultures 1/10 >> NGTD @ 1 day Right Gluteal deep wound culture 1/10 >> GPC and GNR Left Gluteal deep wound culture 1/10 >> GPC and GNR  Antimicrobials:  Aztreonam 1/09 Metronidazole 1/09 Vancomycin 1/09 >> Clindamycin 1/10 >>  Interim History / Subjective:   Overnight, patient was noted to have worsening hypotension with MAP ~ 59. Levophed ceiling was increased with increase in bicarb gtt. Patient was noted to become quite hyperglycemic and insulin gtt started.   This morning on examination, patient is sedated and does not wake or follow commands.   Objective   Blood pressure (!) 107/54, pulse (!) 124, temperature (!) 103.9 F (39.9 C), temperature source Axillary, resp. rate (!) 30, height 6\' 5"  (1.956 m), weight (!) 303.5 kg, SpO2 98 %.    Vent Mode: PRVC FiO2 (%):  [70 %-100 %] 70 % Set Rate:  [20 bmp-30 bmp] 30 bmp Vt Set:  [550 mL-600 mL] 600 mL PEEP:  [10 cmH20] 10 cmH20 Plateau Pressure:  [26 cmH20] 26 cmH20   Intake/Output Summary (Last 24 hours) at 06/11/2020 0732 Last data filed at 06/11/2020 0600 Gross per 24 hour  Intake 10421.54 ml  Output 3315 ml  Net 7106.54 ml   Filed Weights   06/10/20 0216 06/11/20 0445  Weight: (!) 259.2 kg (!) 303.5 kg    Examination: Physical Exam Vitals and  nursing note reviewed.  Constitutional:      Appearance: He is morbidly obese. He is ill-appearing.     Interventions: He is sedated and intubated.  Cardiovascular:     Rate and Rhythm: Tachycardia present. Rhythm irregularly irregular.     Pulses: Decreased pulses.          Dorsalis pedis pulses are detected w/ Doppler on the right side  and detected w/ Doppler on the left side.     Heart sounds: Heart sounds are distant (body habitus).  Pulmonary:     Effort: He is intubated.     Breath sounds: Decreased air movement present.  Abdominal:     General: Bowel sounds are absent.     Palpations: Abdomen is soft.  Skin:    Comments: Lower extremities cool to touch. Upper extremities warm to touch.    Assessment & Plan:   # Septic Shock  # Necrotizing Fascitis of the Left Buttocks s/p Debridement  - Levophed gtt to maintain MAP > 65 - Vasopressin gtt to maintain MAP > 65 - Continue Clindamycin and Vancomycin  - Follow deep wound cultures - Follow blood cultures - Tylenol q4h Prn for fever - General surgery following; plan for surgery again today  # A.fib with RVR: Previously on Amiodarone and Xarelto at home. RVR in the setting of acute infection. Synchronized defibrillation unsuccessful. Amiodarone reloaded. Overnight, continues to be in RVR.  # VT Arrest (06/10/20): ROSC obtained within minutes.  - Amiodarone gtt  # Acute Hypoxic and Hypercapnic Respiratory Failure: In the setting of metabolic encephalopathy and cardiac arrest.   - Continue full vent support - Daily SBT   # Acute Toxic Metabolic Encephalopathy: Multifactorial in the setting of septic shock, hypercapnia.  - Daily wake assessments   # Diabetic Ketoacidosis  # Type 2 Diabetes Mellitus  - Insulin gtt  - Consider transition to SSI once starting to feed   # Metabolic Acidosis: Initially AG acidosis, with resolution in gap. In the setting of sepsis and DKA. Given overnight ABG with pH of 7.14, bicarb gtt was increased. This morning, acidosis is improving, so bicarb gtt stopped.  - Discontinue bicarb gtt - ABG at noon   # Acute Kidney Injury: No history of CKD. On admission, AKI likely pre-renal in the setting of acute infection, however no improvement with IVF resuscitation, so there may be an aspect of ATN at this time. No indication of emergent  dialysis.  # Hyponatremia: Normalized when corrected for glucose.  - Trend creatinine - Avoid nephrotoxic agents - Monitor UOP  Best practice (evaluated daily)  Diet: NPO Pain/Anxiety/Delirium protocol (if indicated): Fentanyl  VAP protocol (if indicated): Ordered DVT prophylaxis: None  GI prophylaxis: Protonix  Glucose control: Insulin gtt Mobility: Bedrest Disposition:ICU  Labs   CBC: Recent Labs  Lab 06/10/20 0244 06/10/20 1710 06/10/20 1756 06/10/20 2034 06/11/20 0016  WBC 17.6*  --   --   --   --   NEUTROABS 15.4*  --   --   --   --   HGB 12.3* 11.2* 12.2* 16.3 11.9*  HCT 38.5* 33.0* 36.0* 48.0 35.0*  MCV 88.3  --   --   --   --   PLT 199  --   --   --   --    Basic Metabolic Panel: Recent Labs  Lab 06/10/20 0244 06/10/20 1550 06/10/20 1710 06/10/20 1756 06/10/20 2034 06/10/20 2047 06/10/20 2105 06/11/20 0016 06/11/20 0304  NA 128* 127*   < >  131* 129* 130*  --  131* 130*  K 4.3 5.2*   < > 5.6* 6.0* 6.1*  --  4.7 4.4  CL 94* 94*  --   --   --  97*  --   --  99  CO2 18* 19*  --   --   --  15*  --   --  22  GLUCOSE 362* 352*  --   --   --  506*  --   --  316*  BUN 36* 38*  --   --   --  38*  --   --  41*  CREATININE 3.56* 3.71*  --   --   --  4.00*  --   --  3.39*  CALCIUM 8.4* 8.0*  --   --   --  7.7*  --   --  7.4*  MG  --   --   --   --   --   --  1.8  --   --    < > = values in this interval not displayed.   GFR: Estimated Creatinine Clearance: 65.9 mL/min (A) (by C-G formula based on SCr of 3.39 mg/dL (H)). Recent Labs  Lab 06/10/20 0244 06/10/20 1550 06/10/20 1600  PROCALCITON  --  3.10  --   WBC 17.6*  --   --   LATICACIDVEN 3.7*  --  2.2*   Liver Function Tests: Recent Labs  Lab 06/10/20 0244  AST 25  ALT 23  ALKPHOS 87  BILITOT 1.3*  PROT 6.3*  ALBUMIN 2.0*   No results for input(s): LIPASE, AMYLASE in the last 168 hours. No results for input(s): AMMONIA in the last 168 hours.  ABG    Component Value Date/Time   PHART  7.292 (L) 06/11/2020 0016   PCO2ART 47.8 06/11/2020 0016   PO2ART 160 (H) 06/11/2020 0016   HCO3 22.8 06/11/2020 0016   TCO2 24 06/11/2020 0016   ACIDBASEDEF 4.0 (H) 06/11/2020 0016   O2SAT 99.0 06/11/2020 0016   Coagulation Profile: No results for input(s): INR, PROTIME in the last 168 hours.  Cardiac Enzymes: No results for input(s): CKTOTAL, CKMB, CKMBINDEX, TROPONINI in the last 168 hours.  HbA1C: Hgb A1c MFr Bld  Date/Time Value Ref Range Status  06/10/2020 04:05 PM 9.6 (H) 4.8 - 5.6 % Final    Comment:    (NOTE) Pre diabetes:          5.7%-6.4%  Diabetes:              >6.4%  Glycemic control for   <7.0% adults with diabetes    CBG: Recent Labs  Lab 06/11/20 0258 06/11/20 0349 06/11/20 0500 06/11/20 0607 06/11/20 0643  GLUCAP 305* 280* 249* 232* 227*   Review of Systems:   Negative except as noted above.   Past Medical History:  He,  has a past medical history of Asthma, Atrial fibrillation (HCC), Diabetes mellitus without complication (HCC), History of cardioversion, and Hypertension.   Surgical History:   Past Surgical History:  Procedure Laterality Date  . ABDOMINAL SURGERY    . LAPAROSCOPIC GASTRIC SLEEVE RESECTION       Social History:   reports that he has been smoking. He has never used smokeless tobacco. He reports previous alcohol use. He reports that he does not use drugs.   Family History:  His family history is not on file.   Allergies Allergies  Allergen Reactions  . Bee Venom Anaphylaxis  Other reaction(s): Unknown Other reaction(s): Unknown Other reaction(s): Unknown   . Penicillins Hives and Anaphylaxis    unknown   . Sglt2 Inhibitors Other (See Comments)    Necrotizing infection of the perineum  . Codeine Other (See Comments)    Pt states that his mother told him he is allergic, but has taken this medication multiple ties with no problems.    . Other Hives     Home Medications  Prior to Admission medications    Medication Sig Start Date End Date Taking? Authorizing Provider  albuterol (VENTOLIN HFA) 108 (90 Base) MCG/ACT inhaler Inhale 2 puffs into the lungs 4 (four) times daily as needed for shortness of breath or wheezing. 12/06/13  Yes [provider]  amiodarone (PACERONE) 200 MG tablet Take 1 tablet by mouth 2 (two) times daily. 01/17/15  Yes [provider]  APPLE CIDER VINEGAR PO Take 1 tablet by mouth daily.   Yes [provider]  atorvastatin (LIPITOR) 20 MG tablet Take 20 mg by mouth every evening. 04/11/19  Yes [provider]  Beta Carotene (VITAMIN A) 25000 UNIT capsule Take 25,000 Units by mouth daily.   Yes [provider]  Cholecalciferol 50 MCG (2000 UT) CAPS Take 2,000 Units by mouth daily. 06/02/19  Yes [provider]  ezetimibe (ZETIA) 10 MG tablet Take 10 mg by mouth daily. 04/05/20  Yes [provider]  levothyroxine (SYNTHROID) 75 MCG tablet Take 75 mcg by mouth daily. 04/03/20  Yes [provider]  liraglutide (VICTOZA) 18 MG/3ML SOPN Inject 1.8 mLs into the skin every evening. 04/28/14  Yes [provider]  lisinopril (ZESTRIL) 10 MG tablet Take 10 mg by mouth daily. 04/05/20  Yes [provider]  metFORMIN (GLUCOPHAGE) 500 MG tablet Take 500 mg by mouth in the morning and at bedtime. 04/03/20  Yes [provider]  metoprolol tartrate (LOPRESSOR) 100 MG tablet Take 100 mg by mouth 2 (two) times daily. 04/05/20  Yes [provider]  Multiple Vitamins-Minerals (ALIVE MENS ENERGY PO) Take 1 tablet by mouth daily.   Yes [provider]  nortriptyline (PAMELOR) 25 MG capsule Take 25 mg by mouth in the morning and at bedtime. 03/13/20  Yes [provider]  nystatin (MYCOSTATIN/NYSTOP) powder Apply 1 application topically daily as needed (rash). 04/11/19  Yes [provider]  Omega-3 Fatty Acids (FISH OIL) 1000 MG CAPS Take 1,000 mg by mouth daily.   Yes [provider]  omeprazole (PRILOSEC) 20 MG capsule Take 20 mg by mouth in the morning and at bedtime. 03/25/20  Yes [provider]  polyethylene glycol powder (GLYCOLAX/MIRALAX) 17 GM/SCOOP powder Take 17 g by mouth daily as needed for mild constipation.   Yes [provider]  pregabalin (LYRICA) 300 MG capsule Take 300 mg by mouth in the morning and at bedtime. 03/13/20  Yes [provider]  XARELTO 20 MG TABS tablet Take 20 mg by mouth daily. 04/19/20  Yes [provider]  Zinc 220 (50 Zn) MG CAPS Take 220 mg by mouth daily.   Yes [provider]     Dr. Verdene Lennert Internal Medicine PGY-2  06/11/2020, 7:32 AM

## 2020-06-11 NOTE — Op Note (Signed)
Date: 06/11/20  Patient: Carl Hill MRN: [id number]  Preoperative Diagnosis: Perineal necrotizing soft tissue infection Postoperative Diagnosis: Same  Procedure: Excisional debridement of perineal necrosis  Surgeon: Sophronia Simas, MD  EBL: 100 mL  Anesthesia: General  Specimens: Perineal tissue  Indications: [name] is a 50 yo male who presented yesterday with sepsis secondary to a perianal necrotizing soft tissue infection with extension onto the perineum. He underwent wide debridement yesterday, and has remained critically ill overnight with high dose pressor requirement and high grade fevers. On exam of his wound this morning he was found to have further necrosis in the anterior aspect. The decision was made to bring him back to the OR for further debridement.  Findings: Necrosis of subcutaneous tissue in the perineum extending to the base of the scrotum. Total debrided area measured 6x11x8cm.   Procedure details: Informed consent was obtained from the patient's mother prior to the procedure. The patient was brought to the operating room already intubated and placed on the table in the  lithotomy position. Perioperative antibiotics were administered per SCIP guidelines. The patient was prepped and draped in the usual sterile fashion. A pre-procedure timeout was taken verifying patient identity, surgical site and procedure to be performed.  The wound was examined and there was obvious necrosis of the tissue on the anterior aspect of the wound extending onto the peritoneum. This tissue was excised using cautery, and debrided back to healthy bleeding tissue. Some purulent drainage was noted. The debridement extended anteriorly to the base of the scrotum. The scrotum and skin on the medial thighs appeared erythematous but viable. Small incisions were made on the bilateral thighs within the areas of erythema to evaluate for underlying necrosis, but the subcutaneous tissue was  well-perfused. Hemostasis was achieved in the wound with cautery and 3-0 vicryl sutures. The wound was packed with saline-moistened kerlex.  All counts were correct x2 at the end of the procedure. The patient remained intubated and was transported back to the ICU in critical condition for further care.  Sophronia Simas, MD 06/11/20 5:12 PM

## 2020-06-11 NOTE — Anesthesia Postprocedure Evaluation (Signed)
Anesthesia Post Note  Patient: Carl Hill  Procedure(s) Performed: IRRIGATION AND DEBRIDEMENT PERINEAL WOUND (N/A )     Patient location during evaluation: SICU Anesthesia Type: General Level of consciousness: sedated Pain management: pain level controlled Vital Signs Assessment: post-procedure vital signs reviewed and stable Respiratory status: patient remains intubated per anesthesia plan Cardiovascular status: stable Postop Assessment: no apparent nausea or vomiting Anesthetic complications: no   No complications documented.  Last Vitals:  Vitals:   06/11/20 1615 06/11/20 1800  BP: (!) 152/81   Pulse:    Resp: (!) 6   Temp:  37.2 C  SpO2: 97%     Last Pain:  Vitals:   06/11/20 1800  TempSrc: Esophageal                 Kareli Hossain DANIEL

## 2020-06-11 NOTE — Progress Notes (Signed)
Pharmacy Antibiotic Note  Carl Hill is a 50 y.o. male admitted on 06/10/2020 with septic shock d/t necrotizing fascitis. Pharmacy has been consulted for vancomycin dosing.  Pt with AKI, Scr improved to 3.63 today. Vanc random 9 mcg/mL.  Plan: Vancomycin 2,000 mg IV x 1.  Vanc random to be drawn 1/12 pending renal fx status. Will reassess tomorrow. Monitor renal fx, cxs, and status of wound after debridement.   Height: 6\' 5"  (195.6 cm) Weight: (!) 303.5 kg (669 lb) IBW/kg (Calculated) : 89.1  Temp (24hrs), Avg:103 F (39.4 C), Min:98.6 F (37 C), Max:105.4 F (40.8 C)  Recent Labs  Lab 06/10/20 0244 06/10/20 1550 06/10/20 1600 06/10/20 2047 06/11/20 0304 06/11/20 0819 06/11/20 0836  WBC 17.6*  --   --   --   --  10.9*  --   CREATININE 3.56* 3.71*  --  4.00* 3.39* 3.63*  --   LATICACIDVEN 3.7*  --  2.2*  --   --   --  1.4  VANCORANDOM  --   --   --   --  9  --   --     Estimated Creatinine Clearance: 61.6 mL/min (A) (by C-G formula based on SCr of 3.63 mg/dL (H)).    Allergies  Allergen Reactions  . Bee Venom Anaphylaxis    Other reaction(s): Unknown Other reaction(s): Unknown Other reaction(s): Unknown   . Penicillins Hives and Anaphylaxis    unknown   . Sglt2 Inhibitors Other (See Comments)    Necrotizing infection of the perineum  . Codeine Other (See Comments)    Pt states that his mother told him he is allergic, but has taken this medication multiple ties with no problems.    . Other Hives    Antimicrobials this admission: Aztreonam 1/10 >> 1/10 Metronidazole 1/10 >> 1/10 vancomycin  1/9 >> cefepime  1/10 >> clindamycin 1/10 >>   Microbiology results: BloodCx: pending WoundCx: pending    Thank you for allowing pharmacy to be a part of this patient's care.  3/10, PharmD Student 06/11/2020 11:52 AM

## 2020-06-11 NOTE — Progress Notes (Addendum)
eLink Physician-Brief Progress Note Patient Name: Carl Hill DOB: 30-Aug-1970 MRN: [id number]   Date of Service  06/11/2020  HPI/Events of Note  DKA - Blood glucose now = 249.Na+ = 130.   eICU Interventions  Plan: 1. D/C LR at 150 mL/hour.  2. D5 0.9 NaCl IV infusion at 125 mL/hour.      Intervention Category Major Interventions: Hyperglycemia - active titration of insulin therapy  Sommer,Steven Eugene 06/11/2020, 6:00 AM

## 2020-06-11 NOTE — Progress Notes (Signed)
Inpatient Diabetes Program Recommendations  AACE/ADA: New Consensus Statement on Inpatient Glycemic Control (2015)  Target Ranges:  Prepandial:   less than 140 mg/dL      Peak postprandial:   less than 180 mg/dL (1-2 hours)      Critically ill patients:  140 - 180 mg/dL   Lab Results  Component Value Date   GLUCAP 165 (H) 06/11/2020   HGBA1C 9.6 (H) 06/10/2020    Review of Glycemic Control Results for Carl Hill, TOEPFER (MRN [id number]) as of 06/11/2020 12:14  Ref. Range 06/11/2020 06:43 06/11/2020 07:47 06/11/2020 08:42 06/11/2020 09:47 06/11/2020 10:50  Glucose-Capillary Latest Ref Range: 70 - 99 mg/dL 779 (H) 390 (H) 300 (H) 175 (H) 165 (H)   Diabetes history: DM 2 Outpatient Diabetes medications:  Victoza 1.8 mg q PM, Metformin 500 mg bid  Current orders for Inpatient glycemic control:  IV insulin  Inpatient Diabetes Program Recommendations:    Note that drip rates >10 units/ hr.  Recommend continuation of insulin drip due to high insulin needs.    Thanks Beryl Meager, RN, BC-ADM Inpatient Diabetes Coordinator Pager [phone] (8a-5p)

## 2020-06-11 NOTE — Transfer of Care (Signed)
Immediate Anesthesia Transfer of Care Note  Patient: Carl Hill  Procedure(s) Performed: IRRIGATION AND DEBRIDEMENT PERINEAL WOUND (N/A )  Patient Location: ICU  Anesthesia Type:General  Level of Consciousness: sedated and Patient remains intubated per anesthesia plan  Airway & Oxygen Therapy: Patient remains intubated per anesthesia plan and Patient placed on Ventilator (see vital sign flow sheet for setting)  Post-op Assessment: Report given to RN and Post -op Vital signs reviewed and stable  Post vital signs: Reviewed and stable  Last Vitals:  Vitals Value Taken Time  BP 106/48 06/11/20 1620  Temp    Pulse    Resp 30 06/11/20 1622  SpO2 91 % 06/11/20 1622  Vitals shown include unvalidated device data.  Last Pain:  Vitals:   06/11/20 1300  TempSrc: Esophageal       Pt transported back to ICU with full monitors and gtts, with vent and RT.  Complications: No complications documented.

## 2020-06-11 NOTE — Progress Notes (Signed)
Initial Nutrition Assessment  DOCUMENTATION CODES:   Morbid obesity  INTERVENTION:   Recommend begin TF via OG tube when patient returns from surgery today: Pivot 1.5 at 30 ml/h, increase by 10 ml every 4 hours to goal rate of 70 ml/h (1680 ml per day) Prosource TF 95 ml QID  Provides 2840 kcal, 246 gm protein, 1260 ml free water daily  NUTRITION DIAGNOSIS:   Increased nutrient needs related to wound healing as evidenced by estimated needs.  GOAL:   Patient will meet greater than or equal to 90% of their needs  MONITOR:   Vent status,Labs,TF tolerance,Skin  REASON FOR ASSESSMENT:   Ventilator    ASSESSMENT:   50 yo male admitted with hyperglycemia, severe septic shock r/t Fournier's gangrene, AKI. PMH includes morbid obesity, HTN, DM, chronic LE wounds (L heel, L calf) A fib.   CT showed extensive necrotizing infection extending into the pelvis, anus, rectum.  S/P excisional debridement on 1/10.  Remains intubated and will likely require multiple OR trips for further debridement.  Discussed patient in ICU rounds and with RN today. Patient returning to the OR today for further debridement. May be able to begin TF later today or tomorrow morning.  Patient is currently intubated on ventilator support MV: 18 L/min Temp (24hrs), Avg:103.1 F (39.5 C), Min:98.6 F (37 C), Max:105.4 F (40.8 C)   Labs reviewed.  CBG: [id number]  Medications reviewed and include colace, IV insulin drip, vasopressin, levophed, sodium bicarb IV.  No recent weights available for review.  NUTRITION - FOCUSED PHYSICAL EXAM:  Flowsheet Row Most Recent Value  Orbital Region No depletion  Upper Arm Region No depletion  Thoracic and Lumbar Region No depletion  Buccal Region No depletion  Temple Region No depletion  Clavicle Bone Region No depletion  Clavicle and Acromion Bone Region No depletion  Scapular Bone Region Unable to assess  Dorsal Hand No depletion   Patellar Region No depletion  Anterior Thigh Region No depletion  Posterior Calf Region No depletion  Edema (RD Assessment) Mild  Hair Reviewed  Eyes Unable to assess  Mouth Unable to assess  Skin Reviewed  Nails Reviewed       Diet Order:   Diet Order            Diet NPO time specified  Diet effective now                 EDUCATION NEEDS:   Not appropriate for education at this time  Skin:  Skin Assessment: Skin Integrity Issues: Skin Integrity Issues:: Stage II,Other (Comment) Stage II: R heel Other: necrotizing infection to pelvis, anus, rectum  Last BM:  1/9  Height:   Ht Readings from Last 1 Encounters:  06/10/20 6\' 5"  (1.956 m)    Weight:   Wt Readings from Last 1 Encounters:  06/11/20 (!) 303.5 kg    Ideal Body Weight:  94.5 kg  BMI:  Body mass index is 79.33 kg/m.  Estimated Nutritional Needs:   Kcal:  2500-2800  Protein:  225-240 gm  Fluid:  >/= 2.5 L    08/09/20, RD, LDN, CNSC Please refer to Amion for contact information.

## 2020-06-11 NOTE — Progress Notes (Signed)
1 Day Post-Op    CC: Hyperglycemia/sepsis  Subjective: He is sedated on the vent.  On multiple drips.  Dr. Zenia Resides has taken down the dressing with multiple people assisting.  He needs to go back to the OR.    Objective: Vital signs in last 24 hours: Temp:  [97.7 F (36.5 C)-104.5 F (40.3 C)] 104.3 F (40.2 C) (01/11 0752) Pulse Rate:  [84-142] 122 (01/11 0745) Resp:  [0-30] 30 (01/11 0745) BP: (77-135)/(31-105) 110/67 (01/11 0745) SpO2:  [89 %-100 %] 98 % (01/11 0745) Arterial Line BP: (77-292)/(36-287) 102/51 (01/11 0745) FiO2 (%):  [70 %-100 %] 70 % (01/11 0600) Weight:  [303.5 kg] 303.5 kg (01/11 0445) Last BM Date: 06/09/20 (PTA) 10,428 IV 3165 urine No other output recorded. Ongoing fevers up to 104 this AM. Tachycardic heart rate 108-120's Blood pressure stable on multipledrips. A.m. blood gas 7.29, PCO2 47.8, PO2 60 HCO3 22.8 NA 132, potassium 4.1, glucose 202 Creatinine 3.63 WBC 10.9 H/H 10.2/30.8 Intake/Output from previous day: 01/10 0701 - 01/11 0700 In: 10428.7 [I.V.:8003.2; IV Piggyback:2425.6] Out: 5364 [Urine:3165; Blood:150] Intake/Output this shift: Total I/O In: 527.5 [I.V.:527.5] Out: -   General appearance: sedated on the Vent Resp: full ventilator support Cardio: tachycardic GI: large non tender Skin: scrotum is red, but no necrosis, gluteal wound shows further worsening/progression of the necrosis   Lab Results:  Recent Labs    06/10/20 0244 06/10/20 1710 06/11/20 0016 06/11/20 0819  WBC 17.6*  --   --  10.9*  HGB 12.3*   < > 11.9* 10.2*  HCT 38.5*   < > 35.0* 30.8*  PLT 199  --   --  200   < > = values in this interval not displayed.    BMET Recent Labs    06/11/20 0304 06/11/20 0819  NA 130* 132*  K 4.4 4.1  CL 99 98  CO2 22 21*  GLUCOSE 316* 202*  BUN 41* 44*  CREATININE 3.39* 3.63*  CALCIUM 7.4* 7.3*   PT/INR No results for input(s): LABPROT, INR in the last 72 hours.  Recent Labs  Lab 06/10/20 0244  AST 25   ALT 23  ALKPHOS 87  BILITOT 1.3*  PROT 6.3*  ALBUMIN 2.0*     Lipase  No results found for: LIPASE   Medications: . atorvastatin  20 mg Per Tube QPM  . chlorhexidine gluconate (MEDLINE KIT)  15 mL Mouth Rinse BID  . Chlorhexidine Gluconate Cloth  6 each Topical Daily  . docusate  100 mg Per Tube BID  . levothyroxine  75 mcg Per Tube Daily  . mouth rinse  15 mL Mouth Rinse 10 times per day  . nortriptyline  25 mg Per Tube BID  . pantoprazole (PROTONIX) IV  40 mg Intravenous Q24H  . sodium bicarbonate  100 mEq Intravenous Once  . sodium chloride flush  3 mL Intravenous Q12H  . vancomycin variable dose per unstable renal function (pharmacist dosing)   Does not apply See admin instructions   . amiodarone 30 mg/hr (06/11/20 0734)  . ceFEPime (MAXIPIME) IV Stopped (06/11/20 0428)  . clindamycin (CLEOCIN) IV Stopped (06/11/20 0539)  . fentaNYL infusion INTRAVENOUS 200 mcg/hr (06/11/20 0734)  . insulin 28 mL/hr at 06/11/20 0734  . norepinephrine (LEVOPHED) Adult infusion 30 mcg/min (06/11/20 0851)  . phenylephrine (NEO-SYNEPHRINE) Adult infusion Stopped (06/11/20 0005)  . propofol (DIPRIVAN) infusion Stopped (06/10/20 1717)  .  sodium bicarbonate (isotonic) infusion in sterile water 125 mL/hr at 06/11/20 0734  . sodium  chloride    . vasopressin 0.03 Units/min (06/11/20 0734)    Assessment/Plan AF >> VT>> synchronized cardioversion/CPR 06/10/2020 Morbid obesity BMI 67.76 (667 lbs) HTN DM/DKA  - Glucose 362>> 506>> 316>> 202 Diabetic neuropathy Chronic venous stasis with lower extremity wounds Afib on xarelto (last dose 1/8 in PM) AKI  -Creatinine 3.56>> 4.0>> 3.63 Hyperglycemia Sepsis  Gluteal necrotizing soft tissue infection left buttock Excision and debridement of perianal, gluteal and perineal necrotizing soft tissue infection 06/10/2020, Dr. Michaelle Birks, POD #1  -WBC 17.6>>10.9   ID - azactam/Flagyl/vancomycin x1 dose 06/10/2020;      -  Maxipime 1/10 >> day 2;  clindamycin 1/10 >> day 2 VTE - SCDs, hold xarelto FEN - IVF, NPO Foley - in place Follow up - TBD  Plan: requiring fewer pressors right now.  Dr. Zenia Resides has looked at the wound and plans to take him back to the OR  today.         LOS: 1 day    Carl Hill 06/11/2020 Please see Amion

## 2020-06-12 ENCOUNTER — Encounter (HOSPITAL_COMMUNITY): Payer: Self-pay | Admitting: Surgery

## 2020-06-12 DIAGNOSIS — A419 Sepsis, unspecified organism: Secondary | ICD-10-CM | POA: Diagnosis not present

## 2020-06-12 DIAGNOSIS — R6521 Severe sepsis with septic shock: Secondary | ICD-10-CM | POA: Diagnosis not present

## 2020-06-12 LAB — COMPREHENSIVE METABOLIC PANEL
ALT: 1943 U/L — ABNORMAL HIGH (ref 0–44)
AST: 3261 U/L — ABNORMAL HIGH (ref 15–41)
Albumin: 1.8 g/dL — ABNORMAL LOW (ref 3.5–5.0)
Alkaline Phosphatase: 80 U/L (ref 38–126)
Anion gap: 15 (ref 5–15)
BUN: 51 mg/dL — ABNORMAL HIGH (ref 6–20)
CO2: 21 mmol/L — ABNORMAL LOW (ref 22–32)
Calcium: 7.1 mg/dL — ABNORMAL LOW (ref 8.9–10.3)
Chloride: 98 mmol/L (ref 98–111)
Creatinine, Ser: 4.47 mg/dL — ABNORMAL HIGH (ref 0.61–1.24)
GFR, Estimated: 15 mL/min — ABNORMAL LOW (ref >60)
Glucose, Bld: 158 mg/dL — ABNORMAL HIGH (ref 70–99)
Potassium: 3.8 mmol/L (ref 3.5–5.1)
Sodium: 134 mmol/L — ABNORMAL LOW (ref 135–145)
Total Bilirubin: 2.3 mg/dL — ABNORMAL HIGH (ref 0.3–1.2)
Total Protein: 5.2 g/dL — ABNORMAL LOW (ref 6.5–8.1)

## 2020-06-12 LAB — GLUCOSE, CAPILLARY
Glucose-Capillary: 123 mg/dL — ABNORMAL HIGH (ref 70–99)
Glucose-Capillary: 127 mg/dL — ABNORMAL HIGH (ref 70–99)
Glucose-Capillary: 132 mg/dL — ABNORMAL HIGH (ref 70–99)
Glucose-Capillary: 135 mg/dL — ABNORMAL HIGH (ref 70–99)
Glucose-Capillary: 135 mg/dL — ABNORMAL HIGH (ref 70–99)
Glucose-Capillary: 138 mg/dL — ABNORMAL HIGH (ref 70–99)
Glucose-Capillary: 142 mg/dL — ABNORMAL HIGH (ref 70–99)
Glucose-Capillary: 148 mg/dL — ABNORMAL HIGH (ref 70–99)
Glucose-Capillary: 153 mg/dL — ABNORMAL HIGH (ref 70–99)
Glucose-Capillary: 153 mg/dL — ABNORMAL HIGH (ref 70–99)
Glucose-Capillary: 153 mg/dL — ABNORMAL HIGH (ref 70–99)
Glucose-Capillary: 155 mg/dL — ABNORMAL HIGH (ref 70–99)
Glucose-Capillary: 164 mg/dL — ABNORMAL HIGH (ref 70–99)
Glucose-Capillary: 176 mg/dL — ABNORMAL HIGH (ref 70–99)

## 2020-06-12 LAB — POCT I-STAT 7, (LYTES, BLD GAS, ICA,H+H)
Acid-base deficit: 2 mmol/L (ref 0.0–2.0)
Bicarbonate: 22.1 mmol/L (ref 20.0–28.0)
Calcium, Ion: 0.98 mmol/L — ABNORMAL LOW (ref 1.15–1.40)
HCT: 35 % — ABNORMAL LOW (ref 39.0–52.0)
Hemoglobin: 11.9 g/dL — ABNORMAL LOW (ref 13.0–17.0)
O2 Saturation: 97 %
Patient temperature: 98.4
Potassium: 3.8 mmol/L (ref 3.5–5.1)
Sodium: 134 mmol/L — ABNORMAL LOW (ref 135–145)
TCO2: 23 mmol/L (ref 22–32)
pCO2 arterial: 35.4 mmHg (ref 32.0–48.0)
pH, Arterial: 7.404 (ref 7.350–7.450)
pO2, Arterial: 90 mmHg (ref 83.0–108.0)

## 2020-06-12 LAB — MAGNESIUM
Magnesium: 1.6 mg/dL — ABNORMAL LOW (ref 1.7–2.4)
Magnesium: 1.8 mg/dL (ref 1.7–2.4)
Magnesium: 1.6 mg/dL — ABNORMAL LOW (ref 1.7–2.4)
Magnesium: 1.7 mg/dL (ref 1.7–2.4)

## 2020-06-12 LAB — PHOSPHORUS
Phosphorus: 5.3 mg/dL — ABNORMAL HIGH (ref 2.5–4.6)
Phosphorus: 5.5 mg/dL — ABNORMAL HIGH (ref 2.5–4.6)

## 2020-06-12 LAB — CBC
HCT: 30 % — ABNORMAL LOW (ref 39.0–52.0)
Hemoglobin: 9.7 g/dL — ABNORMAL LOW (ref 13.0–17.0)
MCH: 28.2 pg (ref 26.0–34.0)
MCHC: 32.3 g/dL (ref 30.0–36.0)
MCV: 87.2 fL (ref 80.0–100.0)
Platelets: 181 10*3/uL (ref 150–400)
RBC: 3.44 MIL/uL — ABNORMAL LOW (ref 4.22–5.81)
RDW: 15.9 % — ABNORMAL HIGH (ref 11.5–15.5)
WBC: 15.2 10*3/uL — ABNORMAL HIGH (ref 4.0–10.5)
nRBC: 0.8 % — ABNORMAL HIGH (ref 0.0–0.2)

## 2020-06-12 LAB — BASIC METABOLIC PANEL
Anion gap: 16 — ABNORMAL HIGH (ref 5–15)
BUN: 61 mg/dL — ABNORMAL HIGH (ref 6–20)
CO2: 20 mmol/L — ABNORMAL LOW (ref 22–32)
Calcium: 7.2 mg/dL — ABNORMAL LOW (ref 8.9–10.3)
Chloride: 98 mmol/L (ref 98–111)
Creatinine, Ser: 4.89 mg/dL — ABNORMAL HIGH (ref 0.61–1.24)
GFR, Estimated: 14 mL/min — ABNORMAL LOW (ref >60)
Glucose, Bld: 149 mg/dL — ABNORMAL HIGH (ref 70–99)
Potassium: 4.2 mmol/L (ref 3.5–5.1)
Sodium: 134 mmol/L — ABNORMAL LOW (ref 135–145)

## 2020-06-12 LAB — PROTIME-INR
INR: 1.3 — ABNORMAL HIGH (ref 0.8–1.2)
Prothrombin Time: 15.4 seconds — ABNORMAL HIGH (ref 11.4–15.2)

## 2020-06-12 LAB — APTT: aPTT: 32 seconds (ref 24–36)

## 2020-06-12 LAB — SURGICAL PATHOLOGY

## 2020-06-12 LAB — TRIGLYCERIDES: Triglycerides: 215 mg/dL — ABNORMAL HIGH (ref <150)

## 2020-06-12 MED ORDER — MIDAZOLAM HCL 2 MG/2ML IJ SOLN: 2.0000 mg | Freq: Once | INTRAMUSCULAR | Status: DC

## 2020-06-12 MED ORDER — MAGNESIUM SULFATE IN D5W 1-5 GM/100ML-% IV SOLN
1.0000 g | Freq: Once | INTRAVENOUS | Status: AC
  Administered 2020-06-12: 1 g via INTRAVENOUS
  Filled 2020-06-12: qty 100

## 2020-06-12 MED ORDER — HEPARIN (PORCINE) 25000 UT/250ML-% IV SOLN
3550.0000 [IU]/h | INTRAVENOUS | Status: DC
  Administered 2020-06-12: 2200 [IU]/h via INTRAVENOUS
  Administered 2020-06-12: 2500 [IU]/h via INTRAVENOUS
  Administered 2020-06-13: 2900 [IU]/h via INTRAVENOUS
  Administered 2020-06-13: 3550 [IU]/h via INTRAVENOUS
  Administered 2020-06-13: 3300 [IU]/h via INTRAVENOUS
  Administered 2020-06-14: 3550 [IU]/h via INTRAVENOUS
  Filled 2020-06-12 (×6): qty 250

## 2020-06-12 MED ORDER — MIDAZOLAM HCL 2 MG/2ML IJ SOLN: 4.0000 mg | Freq: Once | INTRAMUSCULAR | Status: AC

## 2020-06-12 MED ORDER — MIDAZOLAM HCL 2 MG/2ML IJ SOLN
INTRAMUSCULAR | Status: AC
  Administered 2020-06-12: 4 mg via INTRAVENOUS
  Filled 2020-06-12: qty 8

## 2020-06-12 MED ORDER — PHENYLEPHRINE HCL-NACL 10-0.9 MG/250ML-% IV SOLN
0.0000 ug/min | INTRAVENOUS | Status: DC
  Administered 2020-06-12: 20 ug/min via INTRAVENOUS
  Filled 2020-06-12: qty 250

## 2020-06-12 MED ORDER — VITAL HIGH PROTEIN PO LIQD: 1000.0000 mL | ORAL | Status: DC

## 2020-06-12 MED ORDER — MAGNESIUM SULFATE 2 GM/50ML IV SOLN
2.0000 g | Freq: Once | INTRAVENOUS | Status: AC
  Administered 2020-06-12: 2 g via INTRAVENOUS
  Filled 2020-06-12: qty 50

## 2020-06-12 MED ORDER — PROSOURCE TF PO LIQD: 45.0000 mL | Freq: Two times a day (BID) | ORAL | Status: DC

## 2020-06-12 MED ORDER — AMIODARONE IV BOLUS ONLY 150 MG/100ML
150.0000 mg | Freq: Once | INTRAVENOUS | Status: AC
  Administered 2020-06-12: 150 mg via INTRAVENOUS
  Filled 2020-06-12: qty 100

## 2020-06-12 MED ORDER — PROSOURCE TF PO LIQD
90.0000 mL | Freq: Four times a day (QID) | ORAL | Status: DC
  Administered 2020-06-12 – 2020-07-10 (×106): 90 mL
  Filled 2020-06-12 (×105): qty 90

## 2020-06-12 MED ORDER — LACTATED RINGERS IV SOLN: INTRAVENOUS | Status: DC

## 2020-06-12 MED ORDER — PIVOT 1.5 CAL PO LIQD
1000.0000 mL | ORAL | Status: AC
  Administered 2020-06-12 – 2020-07-07 (×25): 1000 mL
  Filled 2020-06-12 (×53): qty 1000

## 2020-06-12 MED FILL — Medication: Qty: 1 | Status: AC

## 2020-06-12 NOTE — Progress Notes (Addendum)
ANTICOAGULATION CONSULT NOTE - Follow Up Consult  Pharmacy Consult fI IV Heparin Indication: atrial fibrillation  Allergies  Allergen Reactions  . Bee Venom Anaphylaxis    Other reaction(s): Unknown Other reaction(s): Unknown Other reaction(s): Unknown   . Penicillins Hives and Anaphylaxis    unknown   . Sglt2 Inhibitors Other (See Comments)    Necrotizing infection of the perineum  . Codeine Other (See Comments)    Pt states that his mother told him he is allergic, but has taken this medication multiple ties with no problems.    . Other Hives    Patient Measurements: Height: 6\' 5"  (195.6 cm) Weight: (!) 303.5 kg (669 lb) IBW/kg (Calculated) : 89.1 Heparin Dosing Weight: 149.7 kg  Vital Signs: Temp: 101.1 F (38.4 C) (01/12 1535) Temp Source: Axillary (01/12 1535) BP: 111/65 (01/12 1900) Pulse Rate: 112 (01/12 1600)  Labs: Recent Labs    06/10/20 0244 06/10/20 1550 06/11/20 0304 06/11/20 0819 06/11/20 0939 06/11/20 1305 06/12/20 0405 06/12/20 0441 06/12/20 1144 06/12/20 1821  HGB 12.3*   < >  --  10.2*   < > 10.2* 9.7* 11.9*  --   --   HCT 38.5*   < >  --  30.8*   < > 30.0* 30.0* 35.0*  --   --   PLT 199  --   --  200  --   --  181  --   --   --   APTT  --   --   --   --   --   --   --   --   --  32  LABPROT  --   --   --   --   --   --   --   --  15.4*  --   INR  --   --   --   --   --   --   --   --  1.3*  --   CREATININE 3.56*   < > 3.39* 3.63*  --   --  4.47*  --   --   --    < > = values in this interval not displayed.    Estimated Creatinine Clearance: 50 mL/min (A) (by C-G formula based on SCr of 4.47 mg/dL (H)).   Assessment: 50 yr old man with diabetes was admitted with septic shock secondary to necrotizing fascitis. During this admission, the patient has been in atrial fibrillation with RVR, despite trial of defibrillation; amiodarone was re-loaded while inpatient. Pt has PMH of atrial fibrillation, was on Xarelto and amiodarone PTA. Due to  recent Xarelto exposure, will monitor anticoagulation using aPTT until aPTT and heparin levels correlate.  Pt is at high risk of bleed due to Xarelto administration ~48 hours prior and recent procedure for excisional debridement of necrotizing fascitis (1/10). No heparin boluses, due to post-op bleeding risk.  aPTT ~5 hrs after starting heparin infusion at 2200 units/hr, was 32 sec, which is below the goal range for this pt. H/H 11.90/35.0, plt 171. Per RN, no issues with IV or bleeding observed.  Goal of Therapy:  aPTT goal 66-102 Monitor platelets by anticoagulation protocol: Yes   Plan:  Increase heparin infusion to 2500 units/hr Check aPTT, heparin level in 6 hrs Monitor daily aPTT, heparin level, CBC Monitor for signs/symptoms of bleeding  53, PharmD, BCPS, [facility]ician [facility] Clinical Pharmacist 06/12/2020,7:10 PM

## 2020-06-12 NOTE — Progress Notes (Signed)
NAME:  Carl Hill, MRN:  [id number], DOB:  1970-12-21, LOS: 2 ADMISSION DATE:  06/10/2020, CONSULTATION DATE:  06/10/2020 REFERRING MD:  Ophelia Charter, CHIEF COMPLAINT:  Severe Sepsis    Brief History:  50 year old white male admitted with necrotizing fasciitis of the left buttock, left upper thigh, and perineum, with associated severe sepsis/septic shock. Critical care consulted  History of Present Illness:  This is a 50 year old chronically ill-appearing obese white male currently lying in bed, lethargic but not in acute distress.   Presented to the emergency room accompanied by his wife the a.m. of 1/10 for evaluation of hypoglycemia, Malaise and fatigue.  Patient had been in his usual state of health until approximately 1 week prior to presentation at which time his wife started to notice he had 2 boils on his left buttocks near the left gluteal fold.  She initially dressed these, after cleaning them.  The patient had progressive pain in the left buttocks, decreased activity, and then wife started noting more hyperglycemia with blood glucose as high as the 400s and because of this he presented to the emergency room.  On arrival to the emergency room he was found to be febrile, his blood glucose was in the 300s, white blood cell count over 17,000, he had a mild lactic acidosis of 3.6, he was tachycardic and hypotensive.  Cultures were sent, CT imaging obtained of the pelvis showing extensive subcutaneous emphysema in the medial aspect of the upper thigh extending throughout the perineum and left medial buttocks with gas extending into the pelvis surrounding the anus.  General surgery was consulted emergently broad-spectrum antibiotic started IV fluid administered critical care consulted and concern for progressive clinical decline  Past Medical History:  HTN, obesity, DM, afib on DOAC  Significant Hospital Events:  06/10/20: Admitted to [facility]. General surgery consulted and debridement pursued. In the  afternoon, patient developed hypotension with AF in RVR. Synchronized cardioversion unsuccessful with conversion to VT. CPR initated with ROSC after desynchronized defibrillation.  Consults:  General surgery, PCCM  Procedures:  06/10/2020: Excisional debridement   Significant Diagnostic Tests:  CT abdomen/pelvis:   Micro Data:  Blood cultures 1/10 >> NGTD @ 1 day Right Gluteal deep wound culture 1/10 >> GPC and GNR Left Gluteal deep wound culture 1/10 >> GPC and GNR  Antimicrobials:  Aztreonam 1/09 Metronidazole 1/09 Vancomycin 1/09 >> Clindamycin 1/10 >>  Interim History / Subjective:   No acute overnight events. This morning, patient is awake. He points towards his throat when asked if he is in any pain and confirms his throat is sore.  Objective   Blood pressure 119/71, pulse 100, temperature 98.2 F (36.8 C), temperature source Oral, resp. rate (!) 30, height 6\' 5"  (1.956 m), weight (!) 303.5 kg, SpO2 98 %. CVP:  [15 mmHg-16 mmHg] 15 mmHg  Vent Mode: PRVC FiO2 (%):  [60 %-100 %] 60 % Set Rate:  [30 bmp] 30 bmp Vt Set:  [600 mL] 600 mL PEEP:  [10 cmH20] 10 cmH20 Plateau Pressure:  [25 cmH20-32 cmH20] 25 cmH20   Intake/Output Summary (Last 24 hours) at 06/12/2020 0732 Last data filed at 06/12/2020 0700 Gross per 24 hour  Intake 5467.28 ml  Output 1475 ml  Net 3992.28 ml   Filed Weights   06/10/20 0216 06/11/20 0445  Weight: (!) 259.2 kg (!) 303.5 kg   Examination: Physical Exam Vitals and nursing note reviewed.  Constitutional:      Appearance: He is morbidly obese. He is ill-appearing.  Interventions: He is intubated.  HENT:     Head: Normocephalic and atraumatic.  Eyes:     Extraocular Movements: Extraocular movements intact.     Pupils: Pupils are equal, round, and reactive to light.  Cardiovascular:     Rate and Rhythm: Normal rate and regular rhythm.  Pulmonary:     Effort: He is intubated.     Breath sounds: No wheezing, rhonchi or rales.   Abdominal:     Palpations: Abdomen is soft.     Comments: Bowel sounds present today although hypoactive.   Skin:    Comments: Cool lower extremities      Assessment & Plan:   # Septic Shock: Pressor support required continues to slowly decrease. Patient has remained afebrile since yesterday. Wound cultures are still pending but gram stain shows GNR and GPC. Blood cultures are NGTD.  # Necrotizing Fascitis of the Left Buttocks s/p Debridement: Polymicrobial with cultures still pending. Second debridement yesterday without any complications.  - Levophed gtt to maintain MAP > 65 - Vasopressin gtt to maintain MAP > 65 - Continue Cefepime, Clindamycin and Vancomycin  - Follow deep wound cultures - Follow blood cultures - Tylenol q4h Prn for fever - General surgery following  # A.fib with RVR: Previously on Amiodarone and Xarelto at home. RVR in the setting of acute infection. Synchronized defibrillation unsuccessful. Amiodarone reloaded. Today, he continues to be in A. Fib with RVR however, rates ~ 120. Given new liver injury, will need to monitor closely while on Amiodarone. Unfortunately, we do not have other options for rate control.  # VT Arrest (06/10/20): ROSC obtained within minutes.  - Telemetry - Amiodarone gtt - Holding Xarelto due to recent surgery  # Acute Hypoxic and Hypercapnic Respiratory Failure: In the setting of metabolic encephalopathy and cardiac arrest.   - Continue full vent support - Daily SBT   # Acute Toxic Metabolic Encephalopathy: Multifactorial in the setting of septic shock, hypercapnia. Overall, improving today.  - Daily wake assessments   # Hyperglycemia  # Type 2 Diabetes Mellitus  - Insulin gtt  - CBG q4h  - Consider transition to SSI once starting to feed   # Acute Kidney Injury: No history of CKD. Initially pre-renal with subsequent development of ATN due to hypotension. Electrolytes stable with good UOP. No indication for emergent dialysis.  #  Hyponatremia: Normalized when corrected for glucose.  - Trend creatinine - Avoid nephrotoxic agents - Monitor UOP  # Acute Liver Injury: Likely shock liver.  - CMP daily - PT/INR pending  # Metabolic Acidosis: Initially AG acidosis, with resolution in gap. In the setting of septic shock. Improved and nearly normalized at this time.   Best practice (evaluated daily)  Diet: NPO Pain/Anxiety/Delirium protocol (if indicated): Fentanyl  VAP protocol (if indicated): Ordered DVT prophylaxis: None  GI prophylaxis: Protonix  Glucose control: Insulin gtt Mobility: Bedrest Disposition:ICU  Labs   CBC: Recent Labs  Lab 06/10/20 0244 06/10/20 1710 06/11/20 0819 06/11/20 0939 06/11/20 1305 06/12/20 0405 06/12/20 0441  WBC 17.6*  --  10.9*  --   --  15.2*  --   NEUTROABS 15.4*  --   --   --   --   --   --   HGB 12.3*   < > 10.2* 10.5* 10.2* 9.7* 11.9*  HCT 38.5*   < > 30.8* 31.0* 30.0* 30.0* 35.0*  MCV 88.3  --  87.0  --   --  87.2  --   PLT 199  --  200  --   --  181  --    < > = values in this interval not displayed.   Basic Metabolic Panel: Recent Labs  Lab 06/10/20 1550 06/10/20 1710 06/10/20 2047 06/10/20 2105 06/11/20 0016 06/11/20 0304 06/11/20 0819 06/11/20 0939 06/11/20 1305 06/12/20 0405 06/12/20 0441  NA 127*   < > 130*  --    < > 130* 132* 135 135 134* 134*  K 5.2*   < > 6.1*  --    < > 4.4 4.1 3.9 3.6 3.8 3.8  CL 94*  --  97*  --   --  99 98  --   --  98  --   CO2 19*  --  15*  --   --  22 21*  --   --  21*  --   GLUCOSE 352*  --  506*  --   --  316* 202*  --   --  158*  --   BUN 38*  --  38*  --   --  41* 44*  --   --  51*  --   CREATININE 3.71*  --  4.00*  --   --  3.39* 3.63*  --   --  4.47*  --   CALCIUM 8.0*  --  7.7*  --   --  7.4* 7.3*  --   --  7.1*  --   MG  --   --   --  1.8  --   --   --   --   --  1.6*  --    < > = values in this interval not displayed.   GFR: Estimated Creatinine Clearance: 50 mL/min (A) (by C-G formula based on SCr of 4.47  mg/dL (H)). Recent Labs  Lab 06/10/20 0244 06/10/20 1550 06/10/20 1600 06/11/20 0819 06/11/20 0836 06/12/20 0405  PROCALCITON  --  3.10  --   --   --   --   WBC 17.6*  --   --  10.9*  --  15.2*  LATICACIDVEN 3.7*  --  2.2*  --  1.4  --    Liver Function Tests: Recent Labs  Lab 06/10/20 0244 06/12/20 0405  AST 25 3,261*  ALT 23 1,943*  ALKPHOS 87 80  BILITOT 1.3* 2.3*  PROT 6.3* 5.2*  ALBUMIN 2.0* 1.8*   No results for input(s): LIPASE, AMYLASE in the last 168 hours. No results for input(s): AMMONIA in the last 168 hours.  ABG    Component Value Date/Time   PHART 7.404 06/12/2020 0441   PCO2ART 35.4 06/12/2020 0441   PO2ART 90 06/12/2020 0441   HCO3 22.1 06/12/2020 0441   TCO2 23 06/12/2020 0441   ACIDBASEDEF 2.0 06/12/2020 0441   O2SAT 97.0 06/12/2020 0441   Coagulation Profile: No results for input(s): INR, PROTIME in the last 168 hours.  Cardiac Enzymes: No results for input(s): CKTOTAL, CKMB, CKMBINDEX, TROPONINI in the last 168 hours.  HbA1C: Hgb A1c MFr Bld  Date/Time Value Ref Range Status  06/10/2020 04:05 PM 9.6 (H) 4.8 - 5.6 % Final    Comment:    (NOTE) Pre diabetes:          5.7%-6.4%  Diabetes:              >6.4%  Glycemic control for   <7.0% adults with diabetes    CBG: Recent Labs  Lab 06/12/20 0009 06/12/20 0101 06/12/20 0301 06/12/20 0459 06/12/20 0609  GLUCAP 176*  164* 153* 135* 155*   Review of Systems:   Negative except as noted above.   Past Medical History:  He,  has a past medical history of Asthma, Atrial fibrillation (HCC), Diabetes mellitus without complication (HCC), History of cardioversion, and Hypertension.   Surgical History:   Past Surgical History:  Procedure Laterality Date  . ABDOMINAL SURGERY    . IRRIGATION AND DEBRIDEMENT ABSCESS N/A 06/10/2020   Procedure: IRRIGATION AND DEBRIDEMENT ABSCESS;  Surgeon: Fritzi Mandes, MD;  Location: [facility] OR;  Service: General;  Laterality: N/A;  . LAPAROSCOPIC GASTRIC  SLEEVE RESECTION       Social History:   reports that he has been smoking. He has never used smokeless tobacco. He reports previous alcohol use. He reports that he does not use drugs.   Family History:  His family history is not on file.   Allergies Allergies  Allergen Reactions  . Bee Venom Anaphylaxis    Other reaction(s): Unknown Other reaction(s): Unknown Other reaction(s): Unknown   . Penicillins Hives and Anaphylaxis    unknown   . Sglt2 Inhibitors Other (See Comments)    Necrotizing infection of the perineum  . Codeine Other (See Comments)    Pt states that his mother told him he is allergic, but has taken this medication multiple ties with no problems.    . Other Hives     Home Medications  Prior to Admission medications   Medication Sig Start Date End Date Taking? Authorizing Provider  albuterol (VENTOLIN HFA) 108 (90 Base) MCG/ACT inhaler Inhale 2 puffs into the lungs 4 (four) times daily as needed for shortness of breath or wheezing. 12/06/13  Yes [provider]  amiodarone (PACERONE) 200 MG tablet Take 1 tablet by mouth 2 (two) times daily. 01/17/15  Yes [provider]  APPLE CIDER VINEGAR PO Take 1 tablet by mouth daily.   Yes [provider]  atorvastatin (LIPITOR) 20 MG tablet Take 20 mg by mouth every evening. 04/11/19  Yes [provider]  Beta Carotene (VITAMIN A) 25000 UNIT capsule Take 25,000 Units by mouth daily.   Yes [provider]  Cholecalciferol 50 MCG (2000 UT) CAPS Take 2,000 Units by mouth daily. 06/02/19  Yes [provider]  ezetimibe (ZETIA) 10 MG tablet Take 10 mg by mouth daily. 04/05/20  Yes [provider]  levothyroxine (SYNTHROID) 75 MCG tablet Take 75 mcg by mouth daily. 04/03/20  Yes [provider]  liraglutide (VICTOZA) 18 MG/3ML SOPN Inject 1.8 mLs into the skin every evening. 04/28/14  Yes [provider]  lisinopril (ZESTRIL) 10 MG tablet Take 10 mg by  mouth daily. 04/05/20  Yes [provider]  metFORMIN (GLUCOPHAGE) 500 MG tablet Take 500 mg by mouth in the morning and at bedtime. 04/03/20  Yes [provider]  metoprolol tartrate (LOPRESSOR) 100 MG tablet Take 100 mg by mouth 2 (two) times daily. 04/05/20  Yes [provider]  Multiple Vitamins-Minerals (ALIVE MENS ENERGY PO) Take 1 tablet by mouth daily.   Yes [provider]  nortriptyline (PAMELOR) 25 MG capsule Take 25 mg by mouth in the morning and at bedtime. 03/13/20  Yes [provider]  nystatin (MYCOSTATIN/NYSTOP) powder Apply 1 application topically daily as needed (rash). 04/11/19  Yes [provider]  Omega-3 Fatty Acids (FISH OIL) 1000 MG CAPS Take 1,000 mg by mouth daily.   Yes [provider]  omeprazole (PRILOSEC) 20 MG capsule Take 20 mg by mouth in the morning  and at bedtime. 03/25/20  Yes [provider]  polyethylene glycol powder (GLYCOLAX/MIRALAX) 17 GM/SCOOP powder Take 17 g by mouth daily as needed for mild constipation.   Yes [provider]  pregabalin (LYRICA) 300 MG capsule Take 300 mg by mouth in the morning and at bedtime. 03/13/20  Yes [provider]  XARELTO 20 MG TABS tablet Take 20 mg by mouth daily. 04/19/20  Yes [provider]  Zinc 220 (50 Zn) MG CAPS Take 220 mg by mouth daily.   Yes [provider]     Dr. Verdene Lennert Internal Medicine PGY-2  06/12/2020, 7:32 AM

## 2020-06-12 NOTE — Progress Notes (Signed)
Inpatient Diabetes Program Recommendations  AACE/ADA: New Consensus Statement on Inpatient Glycemic Control (2015)  Target Ranges:  Prepandial:   less than 140 mg/dL      Peak postprandial:   less than 180 mg/dL (1-2 hours)      Critically ill patients:  140 - 180 mg/dL   Lab Results  Component Value Date   GLUCAP 138 (H) 06/12/2020   HGBA1C 9.6 (H) 06/10/2020    Review of Glycemic Control Results for MYKEAL, CARRICK (MRN [id number]) as of 06/12/2020 10:12  Ref. Range 06/12/2020 06:09 06/12/2020 07:49 06/12/2020 09:06  Glucose-Capillary Latest Ref Range: 70 - 99 mg/dL 932 (H) 671 (H) 245 (H)   Diabetes history: DM 2 Outpatient Diabetes medications:  Farxiga 5 mg daily, Metformin 500 mg bid, Victoza 1.8 mg q PM Current orders for Inpatient glycemic control:  IV insulin- DKA order set  Inpatient Diabetes Program Recommendations:    Agree with current orders, continue with IV insulin given increased IV insulin drip rates.   Of note patient was on SGLT-2 prior to admit which can increase risk for "necrotizing infections of the perineum".  Recommend d/c of SGLT-2/Farxiga from DM medications.   Thanks, Lujean Rave, MSN, RNC-OB Diabetes Coordinator [phone] (8a-5p)

## 2020-06-12 NOTE — Progress Notes (Signed)
eLink Physician-Brief Progress Note Patient Name: PISTOL KESSENICH DOB: 1970-10-15 MRN: [id number]   Date of Service  06/12/2020  HPI/Events of Note  Hypomagnesemia - Mg++ = 1.6 and Creatinine = 4.47.  eICU Interventions  Will replace Mg++.     Intervention Category Major Interventions: Electrolyte abnormality - evaluation and management  [name] 06/12/2020, 8:32 PM

## 2020-06-12 NOTE — Progress Notes (Signed)
eLink Physician-Brief Progress Note Patient Name: NIKHIL OSEI DOB: 1970-09-01 MRN: [id number]   Date of Service  06/12/2020  HPI/Events of Note  Fever to 101.8 F. AST = 3261 and ALT = 1943. Patient currently on Tylenol. Creatinine - 4.89, therefore, he can't have Motrin. Patient is already on ice packs and cooling blanket.  eICU Interventions  Plan: 1. D/C Tylenol PRN. 2. Continue ice packs and cooling blanket.      Intervention Category Major Interventions: Other:  [name] 06/12/2020, 11:20 PM

## 2020-06-12 NOTE — Progress Notes (Signed)
eLink Physician-Brief Progress Note Patient Name: Carl Hill DOB: 1970/06/08 MRN: [id number]   Date of Service  06/12/2020  HPI/Events of Note  Agitation - Patient reaching for ETT. Request for bilateral soft wrist restraints.   eICU Interventions  Will order bilateral soft wrist restraints X 13 hours.      Intervention Category Major Interventions: Delirium, psychosis, severe agitation - evaluation and management  Aleea Hendry Eugene 06/12/2020, 8:01 PM

## 2020-06-12 NOTE — Progress Notes (Signed)
I spoke to the patient's mother and gave her a surgical update with no plans to return to the OR today.  She was very Adult nurse.  All questions answered.  Letha Cape 10:35 AM 06/12/2020

## 2020-06-12 NOTE — Progress Notes (Signed)
eLink Physician-Brief Progress Note Patient Name: Carl Hill DOB: January 09, 1971 MRN: [id number]   Date of Service  06/12/2020  HPI/Events of Note  Multiple issues: 1. AFIB with RVR - Ventricular rate = 150. Nursing reports that ordered Vasopressin and Amiodarone IV infusions had been stopped. Why? 2. Agitation - On Fentanyl IV infusion.   eICU Interventions  Plan: 1. Restart ordered Amiodarone IV infusion. 2. Bolus with Amiodarone 150 mg IV over 10 minutes now. 3. Phenylephrine IV infusion. Titrate to MAP >= 65. 4. Wean Norepinephrine IV infusion as tolerated.. 5. BMP and Mg++ level STAT. 6. Increase ceiling on Fentanyl IV infusion to 400 mcg/hour.      Intervention Category Major Interventions: Delirium, psychosis, severe agitation - evaluation and management;Arrhythmia - evaluation and management  [name] 06/12/2020, 9:25 PM

## 2020-06-12 NOTE — Progress Notes (Addendum)
ANTICOAGULATION CONSULT NOTE - Follow Up Consult  Pharmacy Consult for heparin dosing. Indication: atrial fibrillation  Allergies  Allergen Reactions  . Bee Venom Anaphylaxis    Other reaction(s): Unknown Other reaction(s): Unknown Other reaction(s): Unknown   . Penicillins Hives and Anaphylaxis    unknown   . Sglt2 Inhibitors Other (See Comments)    Necrotizing infection of the perineum  . Codeine Other (See Comments)    Pt states that his mother told him he is allergic, but has taken this medication multiple ties with no problems.    . Other Hives    Patient Measurements: Height: 6\' 5"  (195.6 cm) Weight: (!) 303.5 kg (669 lb) IBW/kg (Calculated) : 89.1 Heparin Dosing Weight: 149.7 kg  Vital Signs: Temp: 98.2 F (36.8 C) (01/12 0732) Temp Source: Oral (01/12 0732) BP: 117/81 (01/12 0800) Pulse Rate: 100 (01/12 0424)  Labs: Recent Labs    06/10/20 0244 06/10/20 1550 06/11/20 0304 06/11/20 0819 06/11/20 0939 06/11/20 1305 06/12/20 0405 06/12/20 0441  HGB 12.3*   < >  --  10.2*   < > 10.2* 9.7* 11.9*  HCT 38.5*   < >  --  30.8*   < > 30.0* 30.0* 35.0*  PLT 199  --   --  200  --   --  181  --   CREATININE 3.56*   < > 3.39* 3.63*  --   --  4.47*  --    < > = values in this interval not displayed.    Estimated Creatinine Clearance: 50 mL/min (A) (by C-G formula based on SCr of 4.47 mg/dL (H)).   Assessment: 49YOM admitted with septic shock d/t necrotizing fascitis. During admission, patient has been in Afib with RVR despite trial of defibrillation. Amiodarone re-loaded while inpatient. PMH of atrial fibrillation on Xarelto and amiodarone at home.   Pt is at high risk of bleed due to Xarelto administration ~48 hours prior and recent procedure for excisional debridement of necrotizing fascitis.  Goal of Therapy:  aPTT goal 66-102.  Monitor platelets by anticoagulation protocol: Yes   Plan:  Start heparin infusion at 2200 units/hr. No bolus dose d/t high  risk of bleed.  aPTT 1800 (will follow until correlates with heparin level) Daily CBC, aPTT and heparin lvl. Monitor for any new bleeding.  08/10/20, PharmD Student 06/12/2020,10:52 AM

## 2020-06-12 NOTE — Progress Notes (Signed)
Nutrition Follow-up / Consult  DOCUMENTATION CODES:   Morbid obesity  INTERVENTION:   Initiate TF via OG tube: Pivot 1.5 at 30 ml/h, increase by 10 ml every 4 hours to goal rate of 70 ml/h (1680 ml per day) Prosource TF 90 ml QID  Provides 2840 kcal, 246 gm protein, 1260 ml free water daily  NUTRITION DIAGNOSIS:   Increased nutrient needs related to wound healing as evidenced by estimated needs.  Ongoing  GOAL:   Patient will meet greater than or equal to 90% of their needs   Progressing with TF initiation  MONITOR:   Vent status,Labs,TF tolerance,Skin  REASON FOR ASSESSMENT:   Consult Enteral/tube feeding initiation and management  ASSESSMENT:   50 yo male admitted with hyperglycemia, severe septic shock r/t Fournier's gangrene, AKI. PMH includes morbid obesity, HTN, DM, chronic LE wounds (L heel, L calf) A fib.   CT on 1/10 showed extensive necrotizing infection extending into the pelvis, anus, rectum.  S/P excisional debridement on 1/10.  S/P debridement in OR 1/11. On dressing change this morning, most of wound looked viable per Surgery note. No plans to return to OR today.  OG tube in place. Received MD Consult for TF initiation and management.  Patient remains intubated on ventilator support MV: 18.3 L/min Temp (24hrs), Avg:100.3 F (37.9 C), Min:98.2 F (36.8 C), Max:105.4 F (40.8 C) MAP (a-line) >/= 65 this morning  Labs reviewed. Na 134, creatinine 4.47, mag 1.6, triglycerides 215 CBG: [phone]  Medications reviewed and include colace, IV insulin drip, vasopressin, levophed.  No new weight available today.  Diet Order:   Diet Order            Diet NPO time specified  Diet effective now                 EDUCATION NEEDS:   Not appropriate for education at this time  Skin:  Skin Assessment: Skin Integrity Issues: Skin Integrity Issues:: Stage II,Other (Comment) Stage II: R heel Other: necrotizing infection to  pelvis, anus, rectum  Last BM:  1/9  Height:   Ht Readings from Last 1 Encounters:  06/10/20 6\' 5"  (1.956 m)    Weight:   Wt Readings from Last 1 Encounters:  06/11/20 (!) 303.5 kg    Ideal Body Weight:  94.5 kg  BMI:  Body mass index is 79.33 kg/m.  Estimated Nutritional Needs:   Kcal:  2500-2800  Protein:  225-240 gm  Fluid:  >/= 2.5 L    08/09/20, RD, LDN, CNSC Please refer to Amion for contact information.

## 2020-06-12 NOTE — Progress Notes (Signed)
eLink Physician-Brief Progress Note Patient Name: Carl Hill DOB: [birth year] MRN: [id number]   Date of Service  06/12/2020  HPI/Events of Note  Hypomagnesemia - Mg++ = 1.6 and Creatinine = 4.47  eICU Interventions  Will replace Mg++.     Intervention Category Major Interventions: Electrolyte abnormality - evaluation and management  Carl Hill 06/12/2020, 6:20 AM

## 2020-06-12 NOTE — Progress Notes (Signed)
1 Day Post-Op    CC: Hyperglycemia/sepsis  Subjective: Remains sedated on the Vent with some pressor support.  Objective: Vital signs in last 24 hours: Temp:  [98.2 F (36.8 C)-105.4 F (40.8 C)] 98.2 F (36.8 C) (01/12 0732) Pulse Rate:  [100-152] 100 (01/12 0424) Resp:  [0-30] 30 (01/12 0700) BP: (99-152)/(48-83) 119/71 (01/12 0700) SpO2:  [92 %-98 %] 98 % (01/12 0700) Arterial Line BP: (91-133)/(45-71) 108/62 (01/12 0700) FiO2 (%):  [60 %-100 %] 60 % (01/12 0424) Last BM Date: 06/09/20  Intake/Output from previous day: 01/11 0701 - 01/12 0700 In: 5994.8 [I.V.:4221.4; IV Piggyback:1773.4] Out: 1475 [Urine:1375; Blood:100] Intake/Output this shift: No intake/output data recorded.  General appearance: he was awake when we went into the room but we sedated him for the dressing change. Resp: On the Vent Skin: Dressing changed and wound examined by Dr. Zenia Resides.  Most of it looks viable,   Lab Results:  Recent Labs    06/11/20 0819 06/11/20 0939 06/12/20 0405 06/12/20 0441  WBC 10.9*  --  15.2*  --   HGB 10.2*   < > 9.7* 11.9*  HCT 30.8*   < > 30.0* 35.0*  PLT 200  --  181  --    < > = values in this interval not displayed.    BMET Recent Labs    06/11/20 0819 06/11/20 0939 06/12/20 0405 06/12/20 0441  NA 132*   < > 134* 134*  K 4.1   < > 3.8 3.8  CL 98  --  98  --   CO2 21*  --  21*  --   GLUCOSE 202*  --  158*  --   BUN 44*  --  51*  --   CREATININE 3.63*  --  4.47*  --   CALCIUM 7.3*  --  7.1*  --    < > = values in this interval not displayed.   PT/INR No results for input(s): LABPROT, INR in the last 72 hours.  Recent Labs  Lab 06/10/20 0244 06/12/20 0405  AST 25 3,261*  ALT 23 1,943*  ALKPHOS 87 80  BILITOT 1.3* 2.3*  PROT 6.3* 5.2*  ALBUMIN 2.0* 1.8*     Lipase  No results found for: LIPASE   Medications: . atorvastatin  20 mg Per Tube QPM  . chlorhexidine gluconate (MEDLINE KIT)  15 mL Mouth Rinse BID  . Chlorhexidine Gluconate  Cloth  6 each Topical Daily  . docusate  100 mg Per Tube BID  . levothyroxine  75 mcg Per Tube Daily  . mouth rinse  15 mL Mouth Rinse 10 times per day  . nortriptyline  25 mg Per Tube BID  . pantoprazole (PROTONIX) IV  40 mg Intravenous Q24H  . sodium bicarbonate  100 mEq Intravenous Once  . sodium chloride flush  3 mL Intravenous Q12H  . vancomycin variable dose per unstable renal function (pharmacist dosing)   Does not apply See admin instructions    Assessment/Plan AF >> VT>> synchronized cardioversion/CPR 06/10/2020 Morbid obesity BMI 67.76 (667 lbs) HTN DM/DKA  - Glucose 362>> 506>> 316>> 202>> 158 Diabetic neuropathy Chronic venous stasis with lower extremity wounds Afib on xarelto (last dose 1/8 in PM) AKI  -Creatinine 3.56>> 4.0>> 3.63>> 4.47 Hyperglycemia Sepsis Acute liver failure  -AST 25>> 3261  - ALT 23>> 1943  - Total bilirubin 1.3>> 2.3 Possible UTI  Gluteal necrotizing soft tissue infection left buttock Excision and debridement of perianal, gluteal and perineal necrotizing soft tissue infection  06/10/2020, Dr. Michaelle Birks, POD #2 Excision debris of perineal necrosis 06/11/2020(6 x 11 x 8 cm area debrided) Dr. Michaelle Birks, POD #1  -WBC 17.6>>10.9>>15.2   ID -azactam/Flagyl/vancomycin x1 dose 06/10/2020;      -  Maxipime 1/10 >> day 2; clindamycin 1/10 >> day 2 VTE -SCDs, hold xarelto FEN -IVF, NPO Foley -in place Follow up -TBD   Plan:  Dr. Zenia Resides has examined the wound and most of it is viable, continue current management.  He does not need to go back to the OR today.    LOS: 2 days    Carl Hill 06/12/2020 Please see Amion

## 2020-06-13 DIAGNOSIS — R6521 Severe sepsis with septic shock: Secondary | ICD-10-CM | POA: Diagnosis not present

## 2020-06-13 DIAGNOSIS — A419 Sepsis, unspecified organism: Secondary | ICD-10-CM | POA: Diagnosis not present

## 2020-06-13 LAB — GLUCOSE, CAPILLARY
Glucose-Capillary: 142 mg/dL — ABNORMAL HIGH (ref 70–99)
Glucose-Capillary: 144 mg/dL — ABNORMAL HIGH (ref 70–99)
Glucose-Capillary: 156 mg/dL — ABNORMAL HIGH (ref 70–99)
Glucose-Capillary: 159 mg/dL — ABNORMAL HIGH (ref 70–99)
Glucose-Capillary: 163 mg/dL — ABNORMAL HIGH (ref 70–99)
Glucose-Capillary: 164 mg/dL — ABNORMAL HIGH (ref 70–99)
Glucose-Capillary: 166 mg/dL — ABNORMAL HIGH (ref 70–99)
Glucose-Capillary: 166 mg/dL — ABNORMAL HIGH (ref 70–99)
Glucose-Capillary: 171 mg/dL — ABNORMAL HIGH (ref 70–99)
Glucose-Capillary: 189 mg/dL — ABNORMAL HIGH (ref 70–99)
Glucose-Capillary: 244 mg/dL — ABNORMAL HIGH (ref 70–99)
Glucose-Capillary: 260 mg/dL — ABNORMAL HIGH (ref 70–99)

## 2020-06-13 LAB — TRIGLYCERIDES: Triglycerides: 228 mg/dL — ABNORMAL HIGH (ref <150)

## 2020-06-13 LAB — PHOSPHORUS
Phosphorus: 5.8 mg/dL — ABNORMAL HIGH (ref 2.5–4.6)
Phosphorus: 6.4 mg/dL — ABNORMAL HIGH (ref 2.5–4.6)

## 2020-06-13 LAB — POCT I-STAT 7, (LYTES, BLD GAS, ICA,H+H)
Acid-base deficit: 2 mmol/L (ref 0.0–2.0)
Bicarbonate: 23.4 mmol/L (ref 20.0–28.0)
Calcium, Ion: 1.01 mmol/L — ABNORMAL LOW (ref 1.15–1.40)
HCT: 29 % — ABNORMAL LOW (ref 39.0–52.0)
Hemoglobin: 9.9 g/dL — ABNORMAL LOW (ref 13.0–17.0)
O2 Saturation: 99 %
Potassium: 3.9 mmol/L (ref 3.5–5.1)
Sodium: 135 mmol/L (ref 135–145)
TCO2: 25 mmol/L (ref 22–32)
pCO2 arterial: 43.7 mmHg (ref 32.0–48.0)
pH, Arterial: 7.337 — ABNORMAL LOW (ref 7.350–7.450)
pO2, Arterial: 143 mmHg — ABNORMAL HIGH (ref 83.0–108.0)

## 2020-06-13 LAB — CBC
HCT: 30.2 % — ABNORMAL LOW (ref 39.0–52.0)
Hemoglobin: 9.7 g/dL — ABNORMAL LOW (ref 13.0–17.0)
MCH: 28.3 pg (ref 26.0–34.0)
MCHC: 32.1 g/dL (ref 30.0–36.0)
MCV: 88 fL (ref 80.0–100.0)
Platelets: 208 10*3/uL (ref 150–400)
RBC: 3.43 MIL/uL — ABNORMAL LOW (ref 4.22–5.81)
RDW: 16.4 % — ABNORMAL HIGH (ref 11.5–15.5)
WBC: 15.3 10*3/uL — ABNORMAL HIGH (ref 4.0–10.5)
nRBC: 1.2 % — ABNORMAL HIGH (ref 0.0–0.2)

## 2020-06-13 LAB — COMPREHENSIVE METABOLIC PANEL
ALT: 1514 U/L — ABNORMAL HIGH (ref 0–44)
AST: 1204 U/L — ABNORMAL HIGH (ref 15–41)
Albumin: 1.9 g/dL — ABNORMAL LOW (ref 3.5–5.0)
Alkaline Phosphatase: 78 U/L (ref 38–126)
Anion gap: 16 — ABNORMAL HIGH (ref 5–15)
BUN: 66 mg/dL — ABNORMAL HIGH (ref 6–20)
CO2: 20 mmol/L — ABNORMAL LOW (ref 22–32)
Calcium: 7.2 mg/dL — ABNORMAL LOW (ref 8.9–10.3)
Chloride: 98 mmol/L (ref 98–111)
Creatinine, Ser: 4.94 mg/dL — ABNORMAL HIGH (ref 0.61–1.24)
GFR, Estimated: 14 mL/min — ABNORMAL LOW (ref >60)
Glucose, Bld: 183 mg/dL — ABNORMAL HIGH (ref 70–99)
Potassium: 4.2 mmol/L (ref 3.5–5.1)
Sodium: 134 mmol/L — ABNORMAL LOW (ref 135–145)
Total Bilirubin: 1.8 mg/dL — ABNORMAL HIGH (ref 0.3–1.2)
Total Protein: 5.4 g/dL — ABNORMAL LOW (ref 6.5–8.1)

## 2020-06-13 LAB — HEPARIN LEVEL (UNFRACTIONATED)
Heparin Unfractionated: <0.1 IU/mL — ABNORMAL LOW (ref 0.30–0.70)
Heparin Unfractionated: 0.1 IU/mL — ABNORMAL LOW (ref 0.30–0.70)
Heparin Unfractionated: 0.15 IU/mL — ABNORMAL LOW (ref 0.30–0.70)

## 2020-06-13 LAB — MAGNESIUM
Magnesium: 2 mg/dL (ref 1.7–2.4)
Magnesium: 1.9 mg/dL (ref 1.7–2.4)

## 2020-06-13 LAB — APTT: aPTT: 32 seconds (ref 24–36)

## 2020-06-13 LAB — SURGICAL PATHOLOGY

## 2020-06-13 LAB — MRSA PCR SCREENING: MRSA by PCR: NEGATIVE

## 2020-06-13 MED ORDER — DEXTROSE 10 % IV SOLN
INTRAVENOUS | Status: DC | PRN
Start: 2020-06-13 — End: 2020-06-13

## 2020-06-13 MED ORDER — POLYETHYLENE GLYCOL 3350 17 G PO PACK
17.0000 g | PACK | Freq: Every day | ORAL | Status: DC
  Administered 2020-06-13 – 2020-06-20 (×7): 17 g
  Filled 2020-06-13 (×7): qty 1

## 2020-06-13 MED ORDER — FENTANYL BOLUS VIA INFUSION
200.0000 ug | Freq: Once | INTRAVENOUS | Status: AC
  Administered 2020-06-13: 200 ug via INTRAVENOUS
  Filled 2020-06-13: qty 200

## 2020-06-13 MED ORDER — ACETAMINOPHEN 160 MG/5ML PO SOLN
650.0000 mg | Freq: Four times a day (QID) | ORAL | Status: DC | PRN
  Administered 2020-06-13 – 2020-07-07 (×12): 650 mg
  Filled 2020-06-13 (×13): qty 20.3

## 2020-06-13 MED ORDER — INSULIN ASPART 100 UNIT/ML ~~LOC~~ SOLN
0.0000 [IU] | SUBCUTANEOUS | Status: DC
  Administered 2020-06-13: 7 [IU] via SUBCUTANEOUS
  Administered 2020-06-13: 11 [IU] via SUBCUTANEOUS
  Administered 2020-06-13: 3 [IU] via SUBCUTANEOUS
  Administered 2020-06-14: 11 [IU] via SUBCUTANEOUS
  Administered 2020-06-14: 4 [IU] via SUBCUTANEOUS
  Administered 2020-06-14 (×2): 7 [IU] via SUBCUTANEOUS
  Administered 2020-06-14 – 2020-06-15 (×7): 4 [IU] via SUBCUTANEOUS
  Administered 2020-06-15: 7 [IU] via SUBCUTANEOUS
  Administered 2020-06-16: 3 [IU] via SUBCUTANEOUS
  Administered 2020-06-16: 4 [IU] via SUBCUTANEOUS
  Administered 2020-06-16: 7 [IU] via SUBCUTANEOUS
  Administered 2020-06-16 – 2020-06-17 (×3): 4 [IU] via SUBCUTANEOUS
  Administered 2020-06-17: 3 [IU] via SUBCUTANEOUS
  Administered 2020-06-17 – 2020-06-18 (×3): 4 [IU] via SUBCUTANEOUS
  Administered 2020-06-18: 7 [IU] via SUBCUTANEOUS
  Administered 2020-06-18: 11 [IU] via SUBCUTANEOUS
  Administered 2020-06-18: 3 [IU] via SUBCUTANEOUS
  Administered 2020-06-18: 11 [IU] via SUBCUTANEOUS
  Administered 2020-06-19 (×4): 4 [IU] via SUBCUTANEOUS
  Administered 2020-06-19: 3 [IU] via SUBCUTANEOUS
  Administered 2020-06-19: 4 [IU] via SUBCUTANEOUS
  Administered 2020-06-20: 7 [IU] via SUBCUTANEOUS
  Administered 2020-06-20 – 2020-06-21 (×5): 4 [IU] via SUBCUTANEOUS
  Administered 2020-06-21: 7 [IU] via SUBCUTANEOUS
  Administered 2020-06-21 (×2): 4 [IU] via SUBCUTANEOUS
  Administered 2020-06-21: 7 [IU] via SUBCUTANEOUS
  Administered 2020-06-21 – 2020-06-22 (×4): 4 [IU] via SUBCUTANEOUS
  Administered 2020-06-22: 7 [IU] via SUBCUTANEOUS
  Administered 2020-06-22: 3 [IU] via SUBCUTANEOUS
  Administered 2020-06-22: 4 [IU] via SUBCUTANEOUS
  Administered 2020-06-23 (×2): 3 [IU] via SUBCUTANEOUS
  Administered 2020-06-23: 4 [IU] via SUBCUTANEOUS
  Administered 2020-06-23: 3 [IU] via SUBCUTANEOUS
  Administered 2020-06-23 – 2020-06-24 (×2): 4 [IU] via SUBCUTANEOUS
  Administered 2020-06-24 (×2): 3 [IU] via SUBCUTANEOUS
  Administered 2020-06-24 (×3): 4 [IU] via SUBCUTANEOUS
  Administered 2020-06-24 – 2020-06-25 (×4): 3 [IU] via SUBCUTANEOUS
  Administered 2020-06-25: 4 [IU] via SUBCUTANEOUS
  Administered 2020-06-25: 3 [IU] via SUBCUTANEOUS
  Administered 2020-06-26 (×2): 4 [IU] via SUBCUTANEOUS
  Administered 2020-06-26 – 2020-06-27 (×5): 3 [IU] via SUBCUTANEOUS
  Administered 2020-06-27 – 2020-06-28 (×2): 4 [IU] via SUBCUTANEOUS
  Administered 2020-06-28 – 2020-06-29 (×4): 3 [IU] via SUBCUTANEOUS
  Administered 2020-06-29: 4 [IU] via SUBCUTANEOUS
  Administered 2020-06-29 – 2020-06-30 (×6): 3 [IU] via SUBCUTANEOUS
  Administered 2020-07-01: 4 [IU] via SUBCUTANEOUS
  Administered 2020-07-01 (×2): 3 [IU] via SUBCUTANEOUS
  Administered 2020-07-02 (×6): 4 [IU] via SUBCUTANEOUS
  Administered 2020-07-03: 7 [IU] via SUBCUTANEOUS
  Administered 2020-07-03: 4 [IU] via SUBCUTANEOUS
  Administered 2020-07-03: 7 [IU] via SUBCUTANEOUS
  Administered 2020-07-03 (×2): 4 [IU] via SUBCUTANEOUS
  Administered 2020-07-03 – 2020-07-04 (×3): 7 [IU] via SUBCUTANEOUS
  Administered 2020-07-04: 4 [IU] via SUBCUTANEOUS
  Administered 2020-07-04: 7 [IU] via SUBCUTANEOUS
  Administered 2020-07-04 – 2020-07-05 (×6): 4 [IU] via SUBCUTANEOUS
  Administered 2020-07-05: 7 [IU] via SUBCUTANEOUS
  Administered 2020-07-05 – 2020-07-08 (×14): 4 [IU] via SUBCUTANEOUS
  Administered 2020-07-08: 7 [IU] via SUBCUTANEOUS
  Administered 2020-07-08: 4 [IU] via SUBCUTANEOUS
  Administered 2020-07-08: 7 [IU] via SUBCUTANEOUS
  Administered 2020-07-08 – 2020-07-09 (×4): 4 [IU] via SUBCUTANEOUS
  Administered 2020-07-09: 3 [IU] via SUBCUTANEOUS
  Administered 2020-07-09: 7 [IU] via SUBCUTANEOUS
  Administered 2020-07-09 (×2): 4 [IU] via SUBCUTANEOUS
  Administered 2020-07-10: 7 [IU] via SUBCUTANEOUS
  Administered 2020-07-10 (×2): 4 [IU] via SUBCUTANEOUS
  Administered 2020-07-10: 7 [IU] via SUBCUTANEOUS
  Administered 2020-07-10: 3 [IU] via SUBCUTANEOUS

## 2020-06-13 MED ORDER — SENNOSIDES 8.8 MG/5ML PO SYRP
10.0000 mL | ORAL_SOLUTION | Freq: Two times a day (BID) | ORAL | Status: DC
  Administered 2020-06-13 – 2020-07-08 (×45): 10 mL
  Filled 2020-06-13 (×48): qty 10

## 2020-06-13 MED ORDER — MIDAZOLAM HCL 2 MG/2ML IJ SOLN
INTRAMUSCULAR | Status: AC
  Filled 2020-06-13: qty 4

## 2020-06-13 MED ORDER — SODIUM CHLORIDE 0.9 % IV SOLN
2.0000 g | INTRAVENOUS | Status: DC
  Administered 2020-06-13 – 2020-06-19 (×7): 2 g via INTRAVENOUS
  Filled 2020-06-13 (×7): qty 20

## 2020-06-13 MED ORDER — INSULIN DETEMIR 100 UNIT/ML ~~LOC~~ SOLN
50.0000 [IU] | Freq: Two times a day (BID) | SUBCUTANEOUS | Status: DC
  Administered 2020-06-13 (×2): 50 [IU] via SUBCUTANEOUS
  Filled 2020-06-13 (×4): qty 0.5

## 2020-06-13 MED ORDER — INSULIN ASPART 100 UNIT/ML ~~LOC~~ SOLN: 3.0000 [IU] | SUBCUTANEOUS | Status: DC

## 2020-06-13 MED ORDER — MIDAZOLAM HCL 2 MG/2ML IJ SOLN
4.0000 mg | Freq: Once | INTRAMUSCULAR | Status: AC
  Administered 2020-06-13: 4 mg via INTRAVENOUS

## 2020-06-13 MED ORDER — SODIUM CHLORIDE 0.9 % IV SOLN
INTRAVENOUS | Status: DC | PRN
  Administered 2020-06-26: 500 mL via INTRAVENOUS

## 2020-06-13 MED ORDER — PHENYLEPHRINE CONCENTRATED 100MG/250ML (0.4 MG/ML) INFUSION SIMPLE
0.0000 ug/min | INTRAVENOUS | Status: DC
  Administered 2020-06-13: 100 ug/min via INTRAVENOUS
  Administered 2020-06-13: 140 ug/min via INTRAVENOUS
  Administered 2020-06-13: 230 ug/min via INTRAVENOUS
  Administered 2020-06-14: 290 ug/min via INTRAVENOUS
  Administered 2020-06-14: 110 ug/min via INTRAVENOUS
  Administered 2020-06-15 (×2): 290 ug/min via INTRAVENOUS
  Administered 2020-06-15 (×2): 275 ug/min via INTRAVENOUS
  Administered 2020-06-16: 150 ug/min via INTRAVENOUS
  Administered 2020-06-16 (×2): 300 ug/min via INTRAVENOUS
  Administered 2020-06-17 (×2): 250 ug/min via INTRAVENOUS
  Administered 2020-06-17: 280 ug/min via INTRAVENOUS
  Administered 2020-06-17: 375 ug/min via INTRAVENOUS
  Administered 2020-06-18 (×3): 300 ug/min via INTRAVENOUS
  Administered 2020-06-18 – 2020-06-19 (×2): 325 ug/min via INTRAVENOUS
  Administered 2020-06-19: 350 ug/min via INTRAVENOUS
  Administered 2020-06-19 (×2): 400 ug/min via INTRAVENOUS
  Administered 2020-06-19: 350 ug/min via INTRAVENOUS
  Administered 2020-06-20: 340 ug/min via INTRAVENOUS
  Administered 2020-06-20: 400 ug/min via INTRAVENOUS
  Administered 2020-06-20: 340 ug/min via INTRAVENOUS
  Administered 2020-06-20: 300 ug/min via INTRAVENOUS
  Administered 2020-06-21: 350 ug/min via INTRAVENOUS
  Administered 2020-06-21: 320 ug/min via INTRAVENOUS
  Administered 2020-06-21: 280 ug/min via INTRAVENOUS
  Administered 2020-06-21: 320 ug/min via INTRAVENOUS
  Administered 2020-06-22: 330 ug/min via INTRAVENOUS
  Filled 2020-06-13 (×39): qty 250

## 2020-06-13 MED ORDER — HEPARIN BOLUS VIA INFUSION
4000.0000 [IU] | Freq: Once | INTRAVENOUS | Status: AC
  Administered 2020-06-13: 4000 [IU] via INTRAVENOUS
  Filled 2020-06-13: qty 4000

## 2020-06-13 MED ORDER — MIDAZOLAM HCL 2 MG/2ML IJ SOLN: 4.0000 mg | Freq: Once | INTRAMUSCULAR | Status: DC

## 2020-06-13 MED ORDER — INSULIN ASPART 100 UNIT/ML ~~LOC~~ SOLN
15.0000 [IU] | SUBCUTANEOUS | Status: DC
  Administered 2020-06-13 – 2020-06-24 (×42): 15 [IU] via SUBCUTANEOUS

## 2020-06-13 NOTE — Progress Notes (Incomplete)
2 Days Post-Op    CC:  Subjective: ***  Objective: Vital signs in last 24 hours: Temp:  [98.2 F (36.8 C)-101.8 F (38.8 C)] 98.6 F (37 C) (01/13 0500) Pulse Rate:  [102-150] 102 (01/13 0316) Resp:  [0-30] 22 (01/13 0700) BP: (111-131)/(60-81) 111/75 (01/13 0000) SpO2:  [88 %-100 %] 88 % (01/13 0700) Arterial Line BP: (86-164)/(41-62) 113/57 (01/13 0700) FiO2 (%):  [60 %-100 %] 100 % (01/13 0316) Weight:  [296.7 kg] 296.7 kg (01/13 0446) Last BM Date: 06/09/20 4723 IV Urine 1520 Tm 101.8,  BP stable on vasopressin/Levophed/amiodarone Labs below  Intake/Output from previous day: 01/12 0701 - 01/13 0700 In: 4723 [I.V.:3717.3; NG/GT:505.5; IV Piggyback:500.3] Out: 1520 [Urine:1520] Intake/Output this shift: No intake/output data recorded.  {physical MGNO:0370488}  Lab Results:  Recent Labs    06/12/20 0405 06/12/20 0441 06/13/20 0410  WBC 15.2*  --  15.3*  HGB 9.7* 11.9* 9.7*  HCT 30.0* 35.0* 30.2*  PLT 181  --  208    BMET Recent Labs    06/12/20 2138 06/13/20 0410  NA 134* 134*  K 4.2 4.2  CL 98 98  CO2 20* 20*  GLUCOSE 149* 183*  BUN 61* 66*  CREATININE 4.89* 4.94*  CALCIUM 7.2* 7.2*   PT/INR Recent Labs    06/12/20 1144  LABPROT 15.4*  INR 1.3*    Recent Labs  Lab 06/10/20 0244 06/12/20 0405 06/13/20 0410  AST 25 3,261* 1,204*  ALT 23 1,943* 1,514*  ALKPHOS 87 80 78  BILITOT 1.3* 2.3* 1.8*  PROT 6.3* 5.2* 5.4*  ALBUMIN 2.0* 1.8* 1.9*     Lipase  No results found for: LIPASE   Medications: . atorvastatin  20 mg Per Tube QPM  . chlorhexidine gluconate (MEDLINE KIT)  15 mL Mouth Rinse BID  . Chlorhexidine Gluconate Cloth  6 each Topical Daily  . docusate  100 mg Per Tube BID  . feeding supplement (PROSource TF)  90 mL Per Tube QID  . levothyroxine  75 mcg Per Tube Daily  . mouth rinse  15 mL Mouth Rinse 10 times per day  . nortriptyline  25 mg Per Tube BID  . pantoprazole (PROTONIX) IV  40 mg Intravenous Q24H  . sodium  bicarbonate  100 mEq Intravenous Once  . sodium chloride flush  3 mL Intravenous Q12H    Assessment/Plan AF >> VT>>synchronized cardioversion/CPR 06/10/2020 Morbid obesity BMI 89.16(945 lbs) HTN DM/DKA - Glucose 362>>506>>316>>202>> 158>>183 Diabetic neuropathy Chronic venous stasis with lower extremity wounds Afib on xarelto (last dose 1/8 in PM) AKI -Creatinine 3.56>>4.0>>3.63>> 4.47>>4.94 Hyperglycemia Sepsis Acute liver failure  -AST 25>> 3261>>1204  - ALT 23>> 1943>>1514  - Total bilirubin 1.3>> 2.3>>1.8 Possible UTI  Gluteal necrotizing soft tissue infection left buttock Excision and debridement of perianal, gluteal and perineal necrotizing soft tissue infection 06/10/2020, Dr. Michaelle Birks, POD #2 Excision debris of perineal necrosis 06/11/2020(6 x 11 x 8 cm area debrided) Dr. Michaelle Birks, POD #1 -WBC17.6>>10.9>>15.2>>15.3   ID -azactam/Flagyl/vancomycin x1 dose 06/10/2020; -Maxipime 1/10>>day 2; clindamycin 1/10>>day 2 VTE -SCDs, hold xarelto FEN -IVF, NPO Foley -in place Follow up -TBD      LOS: 3 days    Carl Hill 06/13/2020 Please see Amion

## 2020-06-13 NOTE — Progress Notes (Signed)
ANTICOAGULATION CONSULT NOTE - Follow Up Consult  Pharmacy Consult for IV heparin Indication: atrial fibrillation  Allergies  Allergen Reactions  . Bee Venom Anaphylaxis    Other reaction(s): Unknown Other reaction(s): Unknown Other reaction(s): Unknown   . Penicillins Hives and Anaphylaxis    unknown   . Sglt2 Inhibitors Other (See Comments)    Necrotizing infection of the perineum  . Codeine Other (See Comments)    Pt states that his mother told him he is allergic, but has taken this medication multiple ties with no problems.    . Other Hives    Patient Measurements: Height: 6\' 5"  (195.6 cm) Weight: (!) 296.7 kg (654 lb) IBW/kg (Calculated) : 89.1 Heparin Dosing Weight: 149.7 kg  Vital Signs: Temp: 98.9 F (37.2 C) (01/13 1511) Temp Source: Rectal (01/13 1511) BP: 104/48 (01/13 1530) Pulse Rate: 112 (01/13 1910)  Labs: Recent Labs    06/11/20 0819 06/11/20 0939 06/12/20 0405 06/12/20 0441 06/12/20 1144 06/12/20 1821 06/12/20 2138 06/13/20 0148 06/13/20 0410 06/13/20 1200 06/13/20 1221 06/13/20 2010  HGB 10.2*   < > 9.7* 11.9*  --   --   --   --  9.7*  --  9.9*  --   HCT 30.8*   < > 30.0* 35.0*  --   --   --   --  30.2*  --  29.0*  --   PLT 200  --  181  --   --   --   --   --  208  --   --   --   APTT  --   --   --   --   --  32  --  32  --   --   --   --   LABPROT  --   --   --   --  15.4*  --   --   --   --   --   --   --   INR  --   --   --   --  1.3*  --   --   --   --   --   --   --   HEPARINUNFRC  --   --   --   --   --   --   --  <0.10*  --  0.10*  --  0.15*  CREATININE 3.63*  --  4.47*  --   --   --  4.89*  --  4.94*  --   --   --    < > = values in this interval not displayed.    Estimated Creatinine Clearance: 44.5 mL/min (A) (by C-G formula based on SCr of 4.94 mg/dL (H)).   Assessment: 49YOM admitted with septic shock secondary to necrotizing fascitis. During this admission, the patient has been in atrial fibrillation with RVR, despite  trial of defibrillation; amiodarone was re-loaded while inpatient. Pt has PMH of atrial fibrillation, was on Xarelto and amiodarone PTA. Due to recent Xarelto exposure, will monitor anticoagulation using aPTT until aPTT and heparin levels correlate.  Heparin level 0.15 after increase to 3300 units/hr. H/H is low stable after I&D on 1/10. No overt bleeding noted.   Cbc stable  Goal of Therapy:  Heparin level 0.3-0.7 units/ml Monitor platelets by anticoagulation protocol: Yes   Plan:  Heparin bolus 4000 units x1  Increase heparin infusion to 3550 units/hr Check heparin level in 6-8 hours  Monitor daily CBC and HL. Monitor for  s/sx of new bleeding.   Link Snuffer, PharmD, BCPS, BCCCP Clinical Pharmacist Please refer to [facility] for [facility] numbers 06/13/2020,9:00 PM

## 2020-06-13 NOTE — Progress Notes (Signed)
2 Days Post-Op  Subjective: Patient still on vent.  Still with fevers up to 101.8.  LFTs trending down some.  Cr still elevated.  Made over 1L of urine each day the last 3 days.  Still on some pressors, levo and vaso.  ROS: unable, on vent  Objective: Vital signs in last 24 hours: Temp:  [98.6 F (37 C)-101.8 F (38.8 C)] 98.6 F (37 C) (01/13 0751) Pulse Rate:  [102-150] 102 (01/13 0316) Resp:  [0-30] 17 (01/13 1300) BP: (111-126)/(60-75) 111/75 (01/13 0000) SpO2:  [88 %-100 %] 97 % (01/13 1300) Arterial Line BP: (78-164)/(35-61) 105/53 (01/13 1300) FiO2 (%):  [80 %-100 %] 80 % (01/13 1200) Weight:  [296.7 kg] 296.7 kg (01/13 0446) Last BM Date: 06/09/20  Intake/Output from previous day: 01/12 0701 - 01/13 0700 In: 4752 [I.V.:3746.2; NG/GT:505.5; IV Piggyback:500.3] Out: 1745 [Urine:1745] Intake/Output this shift: Total I/O In: 1759.9 [I.V.:1063.6; NG/GT:696.3] Out: 532.5 [Urine:532.5]  PE: Buttock: buttock wound is stable, overall clean.  Minimal necrotic tissue around edges, but no significant necrotic tissue present.  Rectum is noted and attempt to place flexiseal but significant hardened stool was encountered.  Manual disimpaction was performed with fair amount of hard stool balls evacuated.  flexi-seal then placed and balloon inflated with 45cc of water.  Wound was then packed with kerlix and dry dressing placed.  Skin: some erythema noted on his bilateral anterior medial thighs.  No evidence of necrosis or crepitus or anything more concerning at this point   Lab Results:  Recent Labs    06/12/20 0405 06/12/20 0441 06/13/20 0410 06/13/20 1221  WBC 15.2*  --  15.3*  --   HGB 9.7*   < > 9.7* 9.9*  HCT 30.0*   < > 30.2* 29.0*  PLT 181  --  208  --    < > = values in this interval not displayed.   BMET Recent Labs    06/12/20 2138 06/13/20 0410 06/13/20 1221  NA 134* 134* 135  K 4.2 4.2 3.9  CL 98 98  --   CO2 20* 20*  --   GLUCOSE 149* 183*  --    BUN 61* 66*  --   CREATININE 4.89* 4.94*  --   CALCIUM 7.2* 7.2*  --    PT/INR Recent Labs    06/12/20 1144  LABPROT 15.4*  INR 1.3*   CMP     Component Value Date/Time   NA 135 06/13/2020 1221   K 3.9 06/13/2020 1221   CL 98 06/13/2020 0410   CO2 20 (L) 06/13/2020 0410   GLUCOSE 183 (H) 06/13/2020 0410   BUN 66 (H) 06/13/2020 0410   CREATININE 4.94 (H) 06/13/2020 0410   CALCIUM 7.2 (L) 06/13/2020 0410   PROT 5.4 (L) 06/13/2020 0410   ALBUMIN 1.9 (L) 06/13/2020 0410   AST 1,204 (H) 06/13/2020 0410   ALT 1,514 (H) 06/13/2020 0410   ALKPHOS 78 06/13/2020 0410   BILITOT 1.8 (H) 06/13/2020 0410   GFRNONAA 14 (L) 06/13/2020 0410   Lipase  No results found for: LIPASE     Studies/Results: No results found.  Anti-infectives: Anti-infectives (From admission, onward)   Start     Dose/Rate Route Frequency Ordered Stop   06/13/20 1400  cefTRIAXone (ROCEPHIN) 2 g in sodium chloride 0.9 % 100 mL IVPB        2 g 200 mL/hr over 30 Minutes Intravenous Every 24 hours 06/13/20 1242     06/11/20 1300  vancomycin (VANCOREADY)  IVPB 2000 mg/400 mL        2,000 mg 200 mL/hr over 120 Minutes Intravenous  Once 06/11/20 1212 06/11/20 1824   06/10/20 2000  clindamycin (CLEOCIN) IVPB 900 mg        900 mg 100 mL/hr over 30 Minutes Intravenous Every 8 hours 06/10/20 1307 06/13/20 2159   06/10/20 1600  ceFEPIme (MAXIPIME) 2 g in sodium chloride 0.9 % 100 mL IVPB  Status:  Discontinued        2 g 200 mL/hr over 30 Minutes Intravenous Every 12 hours 06/10/20 1504 06/13/20 1242   06/10/20 1400  metroNIDAZOLE (FLAGYL) IVPB 500 mg  Status:  Discontinued        500 mg 100 mL/hr over 60 Minutes Intravenous Every 8 hours 06/10/20 1023 06/10/20 1308   06/10/20 1300  aztreonam (AZACTAM) 2 g in sodium chloride 0.9 % 100 mL IVPB  Status:  Discontinued        2 g 200 mL/hr over 30 Minutes Intravenous Every 8 hours 06/10/20 1033 06/10/20 1504   06/10/20 1215  clindamycin (CLEOCIN) IVPB 900 mg         900 mg 100 mL/hr over 30 Minutes Intravenous To Surgery 06/10/20 1210 06/10/20 1210   06/10/20 1033  vancomycin variable dose per unstable renal function (pharmacist dosing)  Status:  Discontinued         Does not apply See admin instructions 06/10/20 1033 06/12/20 1252   06/10/20 0600  metroNIDAZOLE (FLAGYL) tablet 500 mg  Status:  Discontinued        500 mg Oral Every 8 hours 06/10/20 0406 06/10/20 0948   06/10/20 0415  aztreonam (AZACTAM) 2 g in sodium chloride 0.9 % 100 mL IVPB        2 g 200 mL/hr over 30 Minutes Intravenous  Once 06/10/20 0406 06/10/20 0546   06/10/20 0415  vancomycin (VANCOCIN) IVPB 1000 mg/200 mL premix  Status:  Discontinued        1,000 mg 200 mL/hr over 60 Minutes Intravenous  Once 06/10/20 0406 06/10/20 0412   06/10/20 0415  vancomycin (VANCOCIN) 2,500 mg in sodium chloride 0.9 % 500 mL IVPB        2,500 mg 250 mL/hr over 120 Minutes Intravenous  Once 06/10/20 0412 06/10/20 0903       Assessment/Plan AF >> VT>>synchronized cardioversion/CPR 06/10/2020 Morbid obesity BMI 77.55 HTN DM/DKA Diabetic neuropathy Chronic venous stasis with lower extremity wounds Afib on xarelto (last dose 1/8 in PM) AKI -Creatinine 4.94 Hyperglycemia Sepsis Acute liver failure  -AST 1204  - ALT 1514  - Total bilirubin 1.8 Possible UTI  Gluteal necrotizing soft tissue infection left buttock Excision and debridement of perianal, gluteal and perineal necrotizing soft tissue infection 06/10/2020, Dr. Sophronia Simas, POD #3 Excision debris of perineal necrosis 06/11/2020(6 x 11 x 8 cm area debrided) Dr. Sophronia Simas, POD #2 [facility] at 15K. -wound is overall stable with no further needs for debridement at this point -flexi-seal in place.  May not work initially as there may be some additional hard stool higher up than I am able to reach; however, with TFs being initiated, this will likely be necessary in the long run. -cont daily dressing changes -keep a watch on his  anterior medial thigh erythema but no need for debridement or intervention at this point. -cont abx therapy -LFTs trending down today, likely secondary to shock liver -will continue to follow   ID -azactam/Flagyl/vancomycin x1 dose 06/10/2020; -Maxipime 1/10>>day 2; clindamycin 1/10>> VTE -SCDs,  heparin gtt FEN -IVF, TFs Foley -in place Follow up -TBD   LOS: 3 days    Letha Cape , [facility] Surgery 06/13/2020, 2:20 PM Please see Amion for pager number during day hours 7:00am-4:30pm or 7:00am -11:30am on weekends

## 2020-06-13 NOTE — Progress Notes (Addendum)
NAME:  Carl Hill, MRN:  [id number], DOB:  07-06-1970, LOS: 3 ADMISSION DATE:  06/10/2020, CONSULTATION DATE:  06/10/2020 REFERRING MD:  Carl Hill, CHIEF COMPLAINT:  Severe Sepsis    Brief History:  50 year old white male admitted with necrotizing fasciitis of the left buttock, left upper thigh, and perineum, with associated severe sepsis/septic shock. Critical care consulted  History of Present Illness:  This is a 50 year old chronically ill-appearing obese white male currently lying in bed, lethargic but not in acute distress.   Presented to the emergency room accompanied by his wife the a.m. of 1/10 for evaluation of hypoglycemia, Malaise and fatigue.  Patient had been in his usual state of health until approximately 1 week prior to presentation at which time his wife started to notice he had 2 boils on his left buttocks near the left gluteal fold.  She initially dressed these, after cleaning them.  The patient had progressive pain in the left buttocks, decreased activity, and then wife started noting more hyperglycemia with blood glucose as high as the 400s and because of this he presented to the emergency room.  On arrival to the emergency room he was found to be febrile, his blood glucose was in the 300s, white blood cell count over 17,000, he had a mild lactic acidosis of 3.6, he was tachycardic and hypotensive.   Cultures were sent, CT imaging obtained of the pelvis showing extensive subcutaneous emphysema in the medial aspect of the upper thigh extending throughout the perineum and left medial buttocks with gas extending into the pelvis surrounding the anus.  General surgery was consulted emergently broad-spectrum antibiotic started IV fluid administered critical care consulted and concern for progressive clinical decline  Past Medical History:  HTN, obesity, DM, afib on DOAC  Significant Hospital Events:  06/10/20: Admitted to [facility]. General surgery consulted and debridement pursued. In  the afternoon, patient developed hypotension with AF in RVR. Synchronized cardioversion unsuccessful with conversion to VT. CPR initated with ROSC after desynchronized defibrillation.  Consults:  General surgery, PCCM  Procedures:  06/10/2020: Excisional debridement  06/11/2020: Excisional debridement   Significant Diagnostic Tests:   CT abdomen/pelvis (06/10/2020) >>  Extensive subcutaneous emphysema in the medial aspect of the upper thighs extending through perineum and superiorly to the medial left buttock. Gas extends into the pelvis surrounding the anus inferiorly and tracking superiorly along the left wall of the rectum. No definite visualized fluid collections. 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  Micro Data:  Blood cultures 1/10 >> NGTD @ 3 day Right Gluteal deep wound culture 1/10 >> Rare E. Coli, Rare GBS Left Gluteal deep wound culture 1/10 >> Rare GBS  Antimicrobials:  Aztreonam 1/09 Metronidazole 1/09 Vancomycin 1/09, 1/11 Cefepime 1/10 - 1/13  Clindamycin 1/10 >> Ceftriaxone 1/13 >>  Interim History / Subjective:   Overnight, patient's Amiodarone was stopped, unknown reason. Patient went back into A. Fib with RVR with ventricular rates in the 150s. Amiodarone was restarted, with the addition of Phenylephrine in order to wean Levophed.   This morning, Carl Hill denies any acute complaints, including chest pain, abdominal pain.   Objective   Blood pressure 111/75, pulse (!) 102, temperature 98.6 F (37 C), temperature source Rectal, resp. rate (!) 22, height 6\' 5"  (1.956 m), weight (!) 296.7 kg, SpO2 (!) 88 %.    Vent Mode: PRVC FiO2 (%):  [100 %] 100 % Set Rate:  [30 bmp] 30 bmp Vt Set:  [600 mL] 600 mL  PEEP:  [10 cmH20] 10 cmH20 Plateau Pressure:  [11 cmH20-39 cmH20] 27 cmH20   Intake/Output Summary (Last 24 hours) at 06/13/2020 0904 Last data filed at 06/13/2020 0700 Gross per 24 hour   Intake 4382.12 ml  Output 1520 ml  Net 2862.12 ml   Filed Weights   06/10/20 0216 06/11/20 0445 06/13/20 0446  Weight: (!) 259.2 kg (!) 303.5 kg (!) 296.7 kg   Examination: Physical Exam Vitals and nursing note reviewed.  Constitutional:      Appearance: He is morbidly obese. He is ill-appearing.     Interventions: He is intubated.  HENT:     Head: Normocephalic and atraumatic.  Eyes:     Extraocular Movements: Extraocular movements intact.     Pupils: Pupils are equal, round, and reactive to light.  Cardiovascular:     Rate and Rhythm: Normal rate. Rhythm irregularly irregular.     Heart sounds: Heart sounds are distant.  Pulmonary:     Effort: He is intubated.     Breath sounds: No wheezing, rhonchi or rales.  Abdominal:     Palpations: Abdomen is soft.     Comments: Bowel sounds absent  Skin:    Comments: Cool lower extremities  Neurological:     Comments: Answers questions appropriately with nods    Assessment & Plan:   # Septic Shock: Patient continues to have high pressor requirements although improved gradually. Will try to wean off Neo first with increase in Levophed. Antibiotics will be narrowed today given culture results; sensitivities are still pending.  # Necrotizing Fascitis of the Left Buttocks s/p Debridement: No current plans to go back to OR. - Levophed gtt to maintain MAP > 65 - Vasopressin gtt to maintain MAP > 65 - Phenylephrine gtt to maintain MAP > 65  - Discontinue Cefepime and Vancomycin  - Last day of Clindamycin to complete 3 day course  - Start Ceftriaxone - Tylenol q6h Prn for fever - General surgery following  # A.fib with RVR: Previously on Amiodarone and Xarelto at home. RVR in the setting of acute infection. Synchronized defibrillation unsuccessful. Heparin restarted yesterday without complications. Unfortunately, Amiodarone was discontinued overnight for unknown reason. Patient went back into RVR, without worsened hemodynamics though.  Rates improved with restarting of Amio.  # VT Arrest (06/10/20): ROSC obtained within minutes.  - Telemetry - Continue Amiodarone gtt - Continue Heparin gtt   # Acute Hypoxic and Hypercapnic Respiratory Failure: In the setting of metabolic encephalopathy and cardiac arrest.   - Continue full vent support - Daily SBT   # Acute Toxic Metabolic Encephalopathy: Multifactorial in the setting of septic shock, hypercapnia. Improving.  - Daily wake assessments   # Hyperglycemia: In the setting of infection. Will transition to subcutaneous today as tube feeds are near goal. # Type 2 Diabetes Mellitus  - Levemir 50 units BID - Novolog 15 units q4h  - SSI (Resistant)  - Discontinue insulin gtt 1-2 hours after starting [place] insulin  - CBG q4h   # Acute Kidney Injury: No history of CKD. Initially pre-renal with subsequent development of ATN due to hypotension. Electrolytes stable with good UOP. No indication for emergent dialysis, despite worsening creatinine. Creatinine seems to be slowly reaching plateau.  # Hyponatremia: Normalized when corrected for glucose.  - Trend creatinine - Avoid nephrotoxic agents - Monitor UOP - Will consider Lasix trial on the next coming days   # Acute Liver Injury: Likely shock liver. LFTs improving today. No evidence of liver failure.  -  CMP daily  # Metabolic Acidosis: Initially AG acidosis, with resolution in gap. In the setting of septic shock. Improved and nearly normalized at this time.   Best practice (evaluated daily)  Diet: NPO Pain/Anxiety/Delirium protocol (if indicated): Fentanyl, Propofol  VAP protocol (if indicated): Ordered DVT prophylaxis: Heparin GI prophylaxis: Protonix  Glucose control: [place] insulin  Bowel regimen: Docusate BID, Senna BID. Consider methylnaltrexone if no BM. Mobility: Bedrest Disposition: ICU  Labs   CBC: Recent Labs  Lab 06/10/20 0244 06/10/20 1710 06/11/20 0819 06/11/20 0939 06/11/20 1305 06/12/20 0405  06/12/20 0441 06/13/20 0410  WBC 17.6*  --  10.9*  --   --  15.2*  --  15.3*  NEUTROABS 15.4*  --   --   --   --   --   --   --   HGB 12.3*   < > 10.2* 10.5* 10.2* 9.7* 11.9* 9.7*  HCT 38.5*   < > 30.8* 31.0* 30.0* 30.0* 35.0* 30.2*  MCV 88.3  --  87.0  --   --  87.2  --  88.0  PLT 199  --  200  --   --  181  --  208   < > = values in this interval not displayed.   Basic Metabolic Panel: Recent Labs  Lab 06/11/20 0304 06/11/20 0819 06/11/20 0939 06/11/20 1305 06/12/20 0405 06/12/20 0441 06/12/20 1144 06/12/20 1821 06/12/20 2138 06/13/20 0410  NA 130* 132*   < > 135 134* 134*  --   --  134* 134*  K 4.4 4.1   < > 3.6 3.8 3.8  --   --  4.2 4.2  CL 99 98  --   --  98  --   --   --  98 98  CO2 22 21*  --   --  21*  --   --   --  20* 20*  GLUCOSE 316* 202*  --   --  158*  --   --   --  149* 183*  BUN 41* 44*  --   --  51*  --   --   --  61* 66*  CREATININE 3.39* 3.63*  --   --  4.47*  --   --   --  4.89* 4.94*  CALCIUM 7.4* 7.3*  --   --  7.1*  --   --   --  7.2* 7.2*  MG  --   --   --   --  1.6*  --  1.8 1.6* 1.7 2.0  PHOS  --   --   --   --   --   --  5.3* 5.5*  --  5.8*   < > = values in this interval not displayed.   GFR: Estimated Creatinine Clearance: 44.5 mL/min (A) (by C-G formula based on SCr of 4.94 mg/dL (H)). Recent Labs  Lab 06/10/20 0244 06/10/20 1550 06/10/20 1600 06/11/20 0819 06/11/20 0836 06/12/20 0405 06/13/20 0410  PROCALCITON  --  3.10  --   --   --   --   --   WBC 17.6*  --   --  10.9*  --  15.2* 15.3*  LATICACIDVEN 3.7*  --  2.2*  --  1.4  --   --    Liver Function Tests: Recent Labs  Lab 06/10/20 0244 06/12/20 0405 06/13/20 0410  AST 25 3,261* 1,204*  ALT 23 1,943* 1,514*  ALKPHOS 87 80 78  BILITOT 1.3* 2.3* 1.8*  PROT  6.3* 5.2* 5.4*  ALBUMIN 2.0* 1.8* 1.9*   No results for input(s): LIPASE, AMYLASE in the last 168 hours. No results for input(s): AMMONIA in the last 168 hours.  ABG    Component Value Date/Time   PHART 7.404  06/12/2020 0441   PCO2ART 35.4 06/12/2020 0441   PO2ART 90 06/12/2020 0441   HCO3 22.1 06/12/2020 0441   TCO2 23 06/12/2020 0441   ACIDBASEDEF 2.0 06/12/2020 0441   O2SAT 97.0 06/12/2020 0441   Coagulation Profile: Recent Labs  Lab 06/12/20 1144  INR 1.3*    Cardiac Enzymes: No results for input(s): CKTOTAL, CKMB, CKMBINDEX, TROPONINI in the last 168 hours.  HbA1C: Hgb A1c MFr Bld  Date/Time Value Ref Range Status  06/10/2020 04:05 PM 9.6 (H) 4.8 - 5.6 % Final    Comment:    (NOTE) Pre diabetes:          5.7%-6.4%  Diabetes:              >6.4%  Glycemic control for   <7.0% adults with diabetes    CBG: Recent Labs  Lab 06/13/20 0012 06/13/20 0152 06/13/20 0419 06/13/20 0700 06/13/20 0820  GLUCAP 144* 166* 166* 189* 171*   Review of Systems:   Negative except as noted above.   Past Medical History:  He,  has a past medical history of Asthma, Atrial fibrillation (HCC), Diabetes mellitus without complication (HCC), History of cardioversion, and Hypertension.   Surgical History:   Past Surgical History:  Procedure Laterality Date  . ABDOMINAL SURGERY    . INCISION AND DRAINAGE PERIRECTAL ABSCESS N/A 06/11/2020   Procedure: IRRIGATION AND DEBRIDEMENT PERINEAL WOUND;  Surgeon: Fritzi Mandes, MD;  Location: [facility] OR;  Service: General;  Laterality: N/A;  . IRRIGATION AND DEBRIDEMENT ABSCESS N/A 06/10/2020   Procedure: IRRIGATION AND DEBRIDEMENT ABSCESS;  Surgeon: Fritzi Mandes, MD;  Location: [facility] OR;  Service: General;  Laterality: N/A;  . LAPAROSCOPIC GASTRIC SLEEVE RESECTION       Social History:   reports that he has been smoking. He has never used smokeless tobacco. He reports previous alcohol use. He reports that he does not use drugs.   Family History:  His family history is not on file.   Allergies Allergies  Allergen Reactions  . Bee Venom Anaphylaxis    Other reaction(s): Unknown Other reaction(s): Unknown Other reaction(s): Unknown   .  Penicillins Hives and Anaphylaxis    unknown   . Sglt2 Inhibitors Other (See Comments)    Necrotizing infection of the perineum  . Codeine Other (See Comments)    Pt states that his mother told him he is allergic, but has taken this medication multiple ties with no problems.    . Other Hives     Home Medications  Prior to Admission medications   Medication Sig Start Date End Date Taking? Authorizing Provider  albuterol (VENTOLIN HFA) 108 (90 Base) MCG/ACT inhaler Inhale 2 puffs into the lungs 4 (four) times daily as needed for shortness of breath or wheezing. 12/06/13  Yes [provider]  amiodarone (PACERONE) 200 MG tablet Take 1 tablet by mouth 2 (two) times daily. 01/17/15  Yes [provider]  APPLE CIDER VINEGAR PO Take 1 tablet by mouth daily.   Yes [provider]  atorvastatin (LIPITOR) 20 MG tablet Take 20 mg by mouth every evening. 04/11/19  Yes [provider]  Beta Carotene (VITAMIN A) 25000 UNIT capsule Take 25,000 Units by mouth daily.   Yes [provider]  Cholecalciferol 50 MCG (2000 UT) CAPS Take 2,000 Units by mouth daily. 06/02/19  Yes [provider]  ezetimibe (ZETIA) 10 MG tablet Take 10 mg by mouth daily. 04/05/20  Yes [provider]  levothyroxine (SYNTHROID) 75 MCG tablet Take 75 mcg by mouth daily. 04/03/20  Yes [provider]  liraglutide (VICTOZA) 18 MG/3ML SOPN Inject 1.8 mLs into the skin every evening. 04/28/14  Yes [provider]  lisinopril (ZESTRIL) 10 MG tablet Take 10 mg by mouth daily. 04/05/20  Yes [provider]  metFORMIN (GLUCOPHAGE) 500 MG tablet Take 500 mg by mouth in the morning and at bedtime. 04/03/20  Yes [provider]  metoprolol tartrate (LOPRESSOR) 100 MG tablet Take 100 mg by mouth 2 (two) times daily. 04/05/20  Yes [provider]  Multiple Vitamins-Minerals (ALIVE MENS ENERGY PO) Take 1 tablet by mouth daily.   Yes [provider]  nortriptyline (PAMELOR) 25 MG capsule Take 25 mg by mouth in the morning and at bedtime. 03/13/20  Yes [provider]  nystatin (MYCOSTATIN/NYSTOP) powder Apply 1 application topically daily as needed (rash). 04/11/19  Yes [provider]  Omega-3 Fatty Acids (FISH OIL) 1000 MG CAPS Take 1,000 mg by mouth daily.   Yes [provider]  omeprazole (PRILOSEC) 20 MG capsule Take 20 mg by mouth in the morning and at bedtime. 03/25/20  Yes [provider]  polyethylene glycol powder (GLYCOLAX/MIRALAX) 17 GM/SCOOP powder Take 17 g by mouth daily as needed for mild constipation.   Yes [provider]  pregabalin (LYRICA) 300 MG capsule Take 300 mg by mouth in the morning and at bedtime. 03/13/20  Yes [provider]  XARELTO 20 MG TABS tablet Take 20 mg by mouth daily. 04/19/20  Yes [provider]  Zinc 220 (50 Zn) MG CAPS Take 220 mg by mouth daily.   Yes [provider]     Dr. Verdene LennertIulia Maurizio Geno Internal Medicine PGY-2  06/13/2020, 9:04 AM

## 2020-06-13 NOTE — Progress Notes (Addendum)
ANTICOAGULATION CONSULT NOTE - Follow Up Consult  Pharmacy Consult for IV heparin Indication: atrial fibrillation  Allergies  Allergen Reactions   Bee Venom Anaphylaxis    Other reaction(s): Unknown Other reaction(s): Unknown Other reaction(s): Unknown    Penicillins Hives and Anaphylaxis    unknown    Sglt2 Inhibitors Other (See Comments)    Necrotizing infection of the perineum   Codeine Other (See Comments)    Pt states that his mother told him he is allergic, but has taken this medication multiple ties with no problems.     Other Hives    Patient Measurements: Height: 6\' 5"  (195.6 cm) Weight: (!) 296.7 kg (654 lb) IBW/kg (Calculated) : 89.1 Heparin Dosing Weight: 149.7 kg  Vital Signs: Temp: 98.6 F (37 C) (01/13 0751) Temp Source: Rectal (01/13 0751) Pulse Rate: 102 (01/13 0316)  Labs: Recent Labs    06/11/20 0819 06/11/20 0939 06/12/20 0405 06/12/20 0441 06/12/20 1144 06/12/20 1821 06/12/20 2138 06/13/20 0148 06/13/20 0410 06/13/20 1200 06/13/20 1221  HGB 10.2*   < > 9.7* 11.9*  --   --   --   --  9.7*  --  9.9*  HCT 30.8*   < > 30.0* 35.0*  --   --   --   --  30.2*  --  29.0*  PLT 200  --  181  --   --   --   --   --  208  --   --   APTT  --   --   --   --   --  32  --  32  --   --   --   LABPROT  --   --   --   --  15.4*  --   --   --   --   --   --   INR  --   --   --   --  1.3*  --   --   --   --   --   --   HEPARINUNFRC  --   --   --   --   --   --   --  <0.10*  --  0.10*  --   CREATININE 3.63*  --  4.47*  --   --   --  4.89*  --  4.94*  --   --    < > = values in this interval not displayed.    Estimated Creatinine Clearance: 44.5 mL/min (A) (by C-G formula based on SCr of 4.94 mg/dL (H)).   Assessment: 49YOM admitted with septic shock secondary to necrotizing fascitis. During this admission, the patient has been in atrial fibrillation with RVR, despite trial of defibrillation; amiodarone was re-loaded while inpatient. Pt has PMH of atrial  fibrillation, was on Xarelto and amiodarone PTA. Due to recent Xarelto exposure, will monitor anticoagulation using aPTT until aPTT and heparin levels correlate.  Pt is at high risk of bleed due to Xarelto administration ~48 hours prior and recent procedure for excisional debridement of necrotizing fascitis (1/10). No heparin boluses, due to post-op bleeding risk.  Hep lvl now 0.1  Cbc stable   Goal of Therapy:  Heparin level 0.3-0.7 units/ml Monitor platelets by anticoagulation protocol: Yes   Plan:  Increase heparin infusion to 3300 units/hr Check heparin level in 6-8 hours  Monitor daily CBC and HL. Monitor for s/sx of new bleeding.   06/15/20, PharmD Student 06/13/2020,1:38 PM

## 2020-06-13 NOTE — Progress Notes (Signed)
ANTICOAGULATION CONSULT NOTE - Follow Up Consult  Pharmacy Consult for IV Heparin Indication: atrial fibrillation  Allergies  Allergen Reactions  . Bee Venom Anaphylaxis    Other reaction(s): Unknown Other reaction(s): Unknown Other reaction(s): Unknown   . Penicillins Hives and Anaphylaxis    unknown   . Sglt2 Inhibitors Other (See Comments)    Necrotizing infection of the perineum  . Codeine Other (See Comments)    Pt states that his mother told him he is allergic, but has taken this medication multiple ties with no problems.    . Other Hives    Patient Measurements: Height: 6\' 5"  (195.6 cm) Weight: (!) 303.5 kg (669 lb) IBW/kg (Calculated) : 89.1 Heparin Dosing Weight: 149.7 kg  Vital Signs: Temp: 101.4 F (38.6 C) (01/13 0153) Temp Source: Esophageal (01/13 0153) BP: 111/75 (01/13 0000) Pulse Rate: 102 (01/13 0316)  Labs: Recent Labs    06/11/20 0819 06/11/20 0939 06/11/20 1305 06/12/20 0405 06/12/20 0441 06/12/20 1144 06/12/20 1821 06/12/20 2138 06/13/20 0148  HGB 10.2*   < > 10.2* 9.7* 11.9*  --   --   --   --   HCT 30.8*   < > 30.0* 30.0* 35.0*  --   --   --   --   PLT 200  --   --  181  --   --   --   --   --   APTT  --   --   --   --   --   --  32  --   --   LABPROT  --   --   --   --   --  15.4*  --   --   --   INR  --   --   --   --   --  1.3*  --   --   --   HEPARINUNFRC  --   --   --   --   --   --   --   --  <0.10*  CREATININE 3.63*  --   --  4.47*  --   --   --  4.89*  --    < > = values in this interval not displayed.    Estimated Creatinine Clearance: 45.7 mL/min (A) (by C-G formula based on SCr of 4.89 mg/dL (H)).   Assessment: 50 yr old man with diabetes was admitted with septic shock secondary to necrotizing fascitis. During this admission, the patient has been in atrial fibrillation with RVR, despite trial of defibrillation; amiodarone was re-loaded while inpatient. Pt has PMH of atrial fibrillation, was on Xarelto and amiodarone  PTA. Due to recent Xarelto exposure, will monitor anticoagulation using aPTT until aPTT and heparin levels correlate.  Pt is at high risk of bleed due to Xarelto administration ~48 hours prior and recent procedure for excisional debridement of necrotizing fascitis (1/10). No heparin boluses, due to post-op bleeding risk.  aPTT ~5 hrs after starting heparin infusion at 2200 units/hr, was 32 sec, which is below the goal range for this pt. H/H 11.90/35.0, plt 171. Per RN, no issues with IV or bleeding observed.  1/13 AM update:  Heparin level is undetectable No need for further aPTT monitoring  No issues per RN  Goal of Therapy:  Heparin level 0.3-0.7 units/mL Monitor platelets by anticoagulation protocol: Yes   Plan:  Increase heparin infusion to 2900 units/hr Check heparin level in 6-8 hours Monitor daily HL/CBC Monitor for signs/symptoms of bleeding  Narda Bonds, PharmD, BCPS Clinical Pharmacist Phone: [phone]

## 2020-06-14 ENCOUNTER — Inpatient Hospital Stay (HOSPITAL_COMMUNITY): Payer: Medicaid Other

## 2020-06-14 DIAGNOSIS — J9600 Acute respiratory failure, unspecified whether with hypoxia or hypercapnia: Secondary | ICD-10-CM

## 2020-06-14 DIAGNOSIS — A419 Sepsis, unspecified organism: Secondary | ICD-10-CM | POA: Diagnosis not present

## 2020-06-14 DIAGNOSIS — J9602 Acute respiratory failure with hypercapnia: Secondary | ICD-10-CM

## 2020-06-14 DIAGNOSIS — J9601 Acute respiratory failure with hypoxia: Secondary | ICD-10-CM | POA: Diagnosis not present

## 2020-06-14 DIAGNOSIS — N179 Acute kidney failure, unspecified: Secondary | ICD-10-CM

## 2020-06-14 DIAGNOSIS — Z6841 Body Mass Index (BMI) 40.0 and over, adult: Secondary | ICD-10-CM

## 2020-06-14 LAB — COMPREHENSIVE METABOLIC PANEL
ALT: 889 U/L — ABNORMAL HIGH (ref 0–44)
AST: 274 U/L — ABNORMAL HIGH (ref 15–41)
Albumin: 1.9 g/dL — ABNORMAL LOW (ref 3.5–5.0)
Alkaline Phosphatase: 83 U/L (ref 38–126)
Anion gap: 14 (ref 5–15)
BUN: 75 mg/dL — ABNORMAL HIGH (ref 6–20)
CO2: 20 mmol/L — ABNORMAL LOW (ref 22–32)
Calcium: 7.3 mg/dL — ABNORMAL LOW (ref 8.9–10.3)
Chloride: 100 mmol/L (ref 98–111)
Creatinine, Ser: 4.12 mg/dL — ABNORMAL HIGH (ref 0.61–1.24)
GFR, Estimated: 17 mL/min — ABNORMAL LOW (ref >60)
Glucose, Bld: 272 mg/dL — ABNORMAL HIGH (ref 70–99)
Potassium: 4.4 mmol/L (ref 3.5–5.1)
Sodium: 134 mmol/L — ABNORMAL LOW (ref 135–145)
Total Bilirubin: 1.8 mg/dL — ABNORMAL HIGH (ref 0.3–1.2)
Total Protein: 5.6 g/dL — ABNORMAL LOW (ref 6.5–8.1)

## 2020-06-14 LAB — CBC
HCT: 29.1 % — ABNORMAL LOW (ref 39.0–52.0)
Hemoglobin: 9.3 g/dL — ABNORMAL LOW (ref 13.0–17.0)
MCH: 28.5 pg (ref 26.0–34.0)
MCHC: 32 g/dL (ref 30.0–36.0)
MCV: 89.3 fL (ref 80.0–100.0)
Platelets: 195 10*3/uL (ref 150–400)
RBC: 3.26 MIL/uL — ABNORMAL LOW (ref 4.22–5.81)
RDW: 16.7 % — ABNORMAL HIGH (ref 11.5–15.5)
WBC: 17.4 10*3/uL — ABNORMAL HIGH (ref 4.0–10.5)
nRBC: 1.6 % — ABNORMAL HIGH (ref 0.0–0.2)

## 2020-06-14 LAB — GLUCOSE, CAPILLARY
Glucose-Capillary: 184 mg/dL — ABNORMAL HIGH (ref 70–99)
Glucose-Capillary: 186 mg/dL — ABNORMAL HIGH (ref 70–99)
Glucose-Capillary: 200 mg/dL — ABNORMAL HIGH (ref 70–99)
Glucose-Capillary: 207 mg/dL — ABNORMAL HIGH (ref 70–99)
Glucose-Capillary: 255 mg/dL — ABNORMAL HIGH (ref 70–99)
Glucose-Capillary: 267 mg/dL — ABNORMAL HIGH (ref 70–99)

## 2020-06-14 LAB — HEPARIN LEVEL (UNFRACTIONATED)
Heparin Unfractionated: 0.14 IU/mL — ABNORMAL LOW (ref 0.30–0.70)
Heparin Unfractionated: 0.25 IU/mL — ABNORMAL LOW (ref 0.30–0.70)

## 2020-06-14 LAB — TRIGLYCERIDES: Triglycerides: 170 mg/dL — ABNORMAL HIGH (ref <150)

## 2020-06-14 MED ORDER — HEPARIN (PORCINE) 25000 UT/250ML-% IV SOLN
2200.0000 [IU]/h | INTRAVENOUS | Status: DC
  Administered 2020-06-14 (×2): 2000 [IU]/h via INTRAVENOUS
  Administered 2020-06-15 – 2020-06-18 (×10): 2150 [IU]/h via INTRAVENOUS
  Administered 2020-06-19 – 2020-06-21 (×5): 2050 [IU]/h via INTRAVENOUS
  Filled 2020-06-14 (×24): qty 250

## 2020-06-14 MED ORDER — SODIUM CHLORIDE 0.9% FLUSH
10.0000 mL | Freq: Two times a day (BID) | INTRAVENOUS | Status: DC
  Administered 2020-06-14 – 2020-06-27 (×20): 10 mL
  Administered 2020-06-28: 20 mL
  Administered 2020-06-29 – 2020-07-17 (×23): 10 mL
  Administered 2020-07-18: 20 mL

## 2020-06-14 MED ORDER — HEPARIN BOLUS VIA INFUSION
2000.0000 [IU] | Freq: Once | INTRAVENOUS | Status: AC
  Administered 2020-06-14: 2000 [IU] via INTRAVENOUS
  Filled 2020-06-14: qty 2000

## 2020-06-14 MED ORDER — HEPARIN BOLUS VIA INFUSION
4000.0000 [IU] | Freq: Once | INTRAVENOUS | Status: AC
  Administered 2020-06-14: 4000 [IU] via INTRAVENOUS
  Filled 2020-06-14: qty 4000

## 2020-06-14 MED ORDER — INSULIN DETEMIR 100 UNIT/ML ~~LOC~~ SOLN
55.0000 [IU] | Freq: Two times a day (BID) | SUBCUTANEOUS | Status: DC
  Administered 2020-06-14 – 2020-06-16 (×5): 55 [IU] via SUBCUTANEOUS
  Filled 2020-06-14 (×8): qty 0.55

## 2020-06-14 MED ORDER — HEPARIN (PORCINE) 25000 UT/250ML-% IV SOLN
2200.0000 [IU]/h | INTRAVENOUS | Status: DC
  Administered 2020-06-14: 09:00:00 2000 [IU]/h via INTRAVENOUS
  Administered 2020-06-14: 2150 [IU]/h via INTRAVENOUS
  Administered 2020-06-14: 2000 [IU]/h via INTRAVENOUS
  Administered 2020-06-15 – 2020-06-18 (×7): 2150 [IU]/h via INTRAVENOUS
  Administered 2020-06-18: 12:00:00 2000 [IU]/h via INTRAVENOUS
  Administered 2020-06-19: 2050 [IU]/h via INTRAVENOUS
  Administered 2020-06-19 (×3): 2150 [IU]/h via INTRAVENOUS
  Administered 2020-06-20 – 2020-06-21 (×4): 2050 [IU]/h via INTRAVENOUS
  Filled 2020-06-14 (×21): qty 250

## 2020-06-14 MED ORDER — MIDAZOLAM HCL 2 MG/2ML IJ SOLN
4.0000 mg | Freq: Once | INTRAMUSCULAR | Status: AC
  Administered 2020-06-14: 4 mg via INTRAVENOUS
  Filled 2020-06-14: qty 4

## 2020-06-14 MED ORDER — SODIUM CHLORIDE 0.9% FLUSH: 10.0000 mL | INTRAVENOUS | Status: DC | PRN

## 2020-06-14 MED ORDER — METOCLOPRAMIDE HCL 5 MG/ML IJ SOLN
10.0000 mg | Freq: Once | INTRAMUSCULAR | Status: AC
  Administered 2020-06-14: 10 mg via INTRAVENOUS

## 2020-06-14 NOTE — Progress Notes (Signed)
Notified provider patient had projectile vomited a second time while giving medication through OG tube. Tube feeds remain on hold. Provider to place order for cortrak.

## 2020-06-14 NOTE — Progress Notes (Signed)
Nutrition Follow-up   DOCUMENTATION CODES:   Morbid obesity  INTERVENTION:   When Cortrak tube is placed postpyloric, recommend resume TF: Pivot 1.5 at 30 ml/h, increase by 10 ml every 4 hours to goal rate of 70 ml/h (1680 ml per day) Prosource TF 90 ml QID  Provides 2840 kcal, 246 gm protein, 1260 ml free water daily  Even if unable to advance tube postpyloric, recommend resume at least trickle TF via Cortrak.   NUTRITION DIAGNOSIS:   Increased nutrient needs related to wound healing as evidenced by estimated needs.  Ongoing  GOAL:   Patient will meet greater than or equal to 90% of their needs   Currently unmet  MONITOR:   Vent status,Labs,TF tolerance,Skin  REASON FOR ASSESSMENT:   Consult Enteral/tube feeding initiation and management  ASSESSMENT:   50 yo male admitted with hyperglycemia, severe septic shock r/t Fournier's gangrene, AKI. PMH includes morbid obesity, HTN, DM, chronic LE wounds (L heel, L calf) A fib.   Discussed patient in ICU rounds and with RN today. OG tube in place. Patient vomited his TF x 2 this morning. TF now on hold. Cortrak ordered, unable to be advanced past the pylorus.  Radiology consulted for postpyloric tube advancement.  Patient remains intubated on ventilator support. MV: 21 L/min Temp (24hrs), Avg:98.8 F (37.1 C), Min:98 F (36.7 C), Max:99.6 F (37.6 C) MAP (a-line) >/= 61 this morning  Labs reviewed. Na 134, BUN 75, creatinine 4.12, mag 1.6, triglycerides 215 CBG: [phone]  Medications reviewed and include colace, Novolog, Levemir, Miralax, Senokot, vasopressin, levophed, phenylephrine.   Diet Order:   Diet Order            Diet NPO time specified  Diet effective now                 EDUCATION NEEDS:   Not appropriate for education at this time  Skin:  Skin Assessment: Skin Integrity Issues: Skin Integrity Issues:: Stage II,Other (Comment) Stage II: R heel Other: necrotizing infection to pelvis,  anus, rectum  Last BM:  1/13 (disimpacted)  Height:   Ht Readings from Last 1 Encounters:  06/10/20 6\' 5"  (1.956 m)    Weight:   Wt Readings from Last 1 Encounters:  06/14/20 (!) 290.3 kg    Ideal Body Weight:  94.5 kg  BMI:  Body mass index is 75.89 kg/m.  Estimated Nutritional Needs:   Kcal:  2500-2800  Protein:  225-240 gm  Fluid:  >/= 2.5 L    06/16/20, RD, LDN, CNSC Please refer to Amion for contact information.

## 2020-06-14 NOTE — Progress Notes (Signed)
NAME:  Carl Hill, MRN:  [id number], DOB:  01/13/1971, LOS: 4 ADMISSION DATE:  06/10/2020, CONSULTATION DATE:  06/10/2020 REFERRING MD:  Ophelia CharterYates, CHIEF COMPLAINT:  Severe Sepsis    Brief History:  50 year old white male admitted with necrotizing fasciitis of the left buttock, left upper thigh, and perineum, with associated severe sepsis/septic shock. Critical care consulted  History of Present Illness:  This is a 50 year old chronically ill-appearing obese white male currently lying in bed, lethargic but not in acute distress.   Presented to the emergency room accompanied by his wife the a.m. of 1/10 for evaluation of hypoglycemia, Malaise and fatigue.  Patient had been in his usual state of health until approximately 1 week prior to presentation at which time his wife started to notice he had 2 boils on his left buttocks near the left gluteal fold.  She initially dressed these, after cleaning them.  The patient had progressive pain in the left buttocks, decreased activity, and then wife started noting more hyperglycemia with blood glucose as high as the 400s and because of this he presented to the emergency room.  On arrival to the emergency room he was found to be febrile, his blood glucose was in the 300s, white blood cell count over 17,000, he had a mild lactic acidosis of 3.6, he was tachycardic and hypotensive.   Cultures were sent, CT imaging obtained of the pelvis showing extensive subcutaneous emphysema in the medial aspect of the upper thigh extending throughout the perineum and left medial buttocks with gas extending into the pelvis surrounding the anus.  General surgery was consulted emergently broad-spectrum antibiotic started IV fluid administered critical care consulted and concern for progressive clinical decline  Past Medical History:  HTN, obesity, DM, afib on DOAC  Significant Hospital Events:  06/10/20: [facility]. General surgery consulted and debridement pursued. In  the afternoon, patient developed hypotension with AF in RVR. Synchronized cardioversion unsuccessful with conversion to VT. CPR initated with ROSC after desynchronized defibrillation.  Consults:  General surgery, PCCM  Procedures:  06/10/2020: Excisional debridement  06/11/2020: Excisional debridement   Significant Diagnostic Tests:   CT abdomen/pelvis (06/10/2020) >>  Extensive subcutaneous emphysema in the medial aspect of the upper thighs extending through perineum and superiorly to the medial left buttock. Gas extends into the pelvis surrounding the anus inferiorly and tracking superiorly along the left wall of the rectum. No definite visualized fluid collections. 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  Micro Data:  Blood cultures 1/10 >> NGTD @ 3 day Right Gluteal deep wound culture 1/10 >> Rare E. Coli, Rare Strep agalactiae, Rare strep Infantarius, Rare Strep Constellatus. No anaerobes detected.  Left Gluteal deep wound culture 1/10 >> Rare GBS  Antimicrobials:  Aztreonam 1/09 Metronidazole 1/09 Vancomycin 1/09, 1/11 Cefepime 1/10 - 1/13  Clindamycin 1/10 -1/13 Ceftriaxone 1/13 >>  Interim History / Subjective:   No overnight events noted. Mr. Carl Hill states he is doing okay. He denies any pain or discomfort at this time. While chatting this morning, Mr. Carl Hill had 1 x episode of emesis of tubes feeds.   Objective   Blood pressure (!) 112/53, pulse (!) 102, temperature 98 F (36.7 C), temperature source Esophageal, resp. rate (!) 22, height 6\' 5"  (1.956 m), weight (!) 290.3 kg, SpO2 98 %.    Vent Mode: PRVC FiO2 (%):  [40 %-80 %] 40 % Set Rate:  [26 bmp-30 bmp] 26 bmp Vt Set:  [600 mL] 600 mL PEEP:  [  10 cmH20] 10 cmH20 Plateau Pressure:  [23 cmH20-26 cmH20] 26 cmH20   Intake/Output Summary (Last 24 hours) at 06/14/2020 1013 Last data filed at 06/14/2020 0730 Gross per 24 hour  Intake 4352.21 ml  Output  5085 ml  Net -732.79 ml   Filed Weights   06/11/20 0445 06/13/20 0446 06/14/20 0500  Weight: (!) 303.5 kg (!) 296.7 kg (!) 290.3 kg   Examination: Physical Exam Vitals and nursing note reviewed.  Constitutional:      Appearance: He is morbidly obese. He is ill-appearing.     Interventions: He is intubated.  HENT:     Head: Normocephalic and atraumatic.  Eyes:     Extraocular Movements: Extraocular movements intact.     Pupils: Pupils are equal, round, and reactive to light.  Cardiovascular:     Rate and Rhythm: Tachycardia present. Rhythm irregularly irregular.     Heart sounds: Heart sounds are distant. No murmur heard.   Pulmonary:     Effort: He is intubated.     Breath sounds: No wheezing, rhonchi or rales.  Abdominal:     Palpations: Abdomen is soft.     Comments: Bowel sounds absent  Skin:    General: Skin is dry.     Comments: Cool lower extremities with dusky appearance of the bilateral feet.  Neurological:     General: No focal deficit present.     Mental Status: He is oriented to person, place, and time.     Comments: Answers questions appropriately with nods. Following all commands. Gives thumbs up  Psychiatric:        Mood and Affect: Mood normal.        Behavior: Behavior normal.    Assessment & Plan:   # Septic Shock: Pressor support still high but overall improving.  # Necrotizing Fascitis of the Left Buttocks s/p Debridement: No current plans to go back to OR. - Levophed, Vasopressin, Phenylephrine gtt to maintain MAP > 65 - Plan to wean Levophed off and up-titrate Phenylephrine PRN to maintain MAPs. Continue Vasopressin without changes. - Continue Ceftriaxone - Tylenol q6h Prn for fever - General surgery following  # A.fib with RVR: Previously on Amiodarone and Xarelto at home. RVR in the setting of acute infection. Synchronized defibrillation unsuccessful. Heart rate is stable overnight and into this morning in the low 100s, occasionally improving  to the 80-90s.  # VT Arrest (06/10/20): Complication of synchronized defibrillation. ROSC obtained within seconds to minutes.  - Telemetry - Continue Amiodarone gtt - Continue Heparin gtt   # Acute Hypoxic and Hypercapnic Respiratory Failure: In the setting of metabolic encephalopathy and cardiac arrest.  SBT this morning was successful with patient thus far. Patient is tolerating PCS.  - Continue full vent support with PCS during day time as tolerated - Daily SBT   # Hyperglycemia: In the setting of infection. Since transition to subcutaneous insulin, average CBGs in the 250s. Will up-titrate Lantus.  # Type 2 Diabetes Mellitus  - Levemir 55 units BID - Novolog 15 units q4h  - SSI (Resistant)  - CBG q4h   # Acute Kidney Injury: No history of CKD. Initially pre-renal with subsequent development of ATN due to hypotension. Increased UOP overnight and this morning with improvement in creatinine. I suspect his kidneys are recovering at this point. No indication for Lasix trial.  # Hyponatremia: Normalized when corrected for glucose.  - Trend creatinine - Avoid nephrotoxic agents - Monitor UOP  # Acute Liver Injury: Likely shock liver.  LFTs continue to improve greatly. No evidence of liver failure.  - CMP daily  # Metabolic Acidosis: Initially AG acidosis, with resolution in gap. In the setting of septic shock. Improved and nearly normalized at this time.   # Acute Toxic Metabolic Encephalopathy: Resolved  Best practice (evaluated daily)  Diet: Tube Feeds Pain/Anxiety/Delirium protocol (if indicated): Fentanyl, Propofol  VAP protocol (if indicated): Ordered DVT prophylaxis: Heparin GI prophylaxis: Protonix  Glucose control: [place] insulin  Bowel regimen: Docusate BID, Senna BID. Consider methylnaltrexone if no BM. Disimpaction per General surgery Mobility: Bedrest Disposition: ICU  Labs   CBC: Recent Labs  Lab 06/10/20 0244 06/10/20 1710 06/11/20 0819 06/11/20 0939  06/12/20 0405 06/12/20 0441 06/13/20 0410 06/13/20 1221 06/14/20 0451  WBC 17.6*  --  10.9*  --  15.2*  --  15.3*  --  17.4*  NEUTROABS 15.4*  --   --   --   --   --   --   --   --   HGB 12.3*   < > 10.2*   < > 9.7* 11.9* 9.7* 9.9* 9.3*  HCT 38.5*   < > 30.8*   < > 30.0* 35.0* 30.2* 29.0* 29.1*  MCV 88.3  --  87.0  --  87.2  --  88.0  --  89.3  PLT 199  --  200  --  181  --  208  --  195   < > = values in this interval not displayed.   Basic Metabolic Panel: Recent Labs  Lab 06/11/20 0819 06/11/20 0939 06/12/20 0405 06/12/20 0441 06/12/20 1144 06/12/20 1821 06/12/20 2138 06/13/20 0410 06/13/20 1221 06/13/20 1700 06/14/20 0451  NA 132*   < > 134* 134*  --   --  134* 134* 135  --  134*  K 4.1   < > 3.8 3.8  --   --  4.2 4.2 3.9  --  4.4  CL 98  --  98  --   --   --  98 98  --   --  100  CO2 21*  --  21*  --   --   --  20* 20*  --   --  20*  GLUCOSE 202*  --  158*  --   --   --  149* 183*  --   --  272*  BUN 44*  --  51*  --   --   --  61* 66*  --   --  75*  CREATININE 3.63*  --  4.47*  --   --   --  4.89* 4.94*  --   --  4.12*  CALCIUM 7.3*  --  7.1*  --   --   --  7.2* 7.2*  --   --  7.3*  MG  --   --  1.6*  --  1.8 1.6* 1.7 2.0  --  1.9  --   PHOS  --   --   --   --  5.3* 5.5*  --  5.8*  --  6.4*  --    < > = values in this interval not displayed.   GFR: Estimated Creatinine Clearance: 52.6 mL/min (A) (by C-G formula based on SCr of 4.12 mg/dL (H)). Recent Labs  Lab 06/10/20 0244 06/10/20 1550 06/10/20 1600 06/11/20 0819 06/11/20 0836 06/12/20 0405 06/13/20 0410 06/14/20 0451  PROCALCITON  --  3.10  --   --   --   --   --   --  WBC 17.6*  --   --  10.9*  --  15.2* 15.3* 17.4*  LATICACIDVEN 3.7*  --  2.2*  --  1.4  --   --   --    Liver Function Tests: Recent Labs  Lab 06/10/20 0244 06/12/20 0405 06/13/20 0410 06/14/20 0451  AST 25 3,261* 1,204* 274*  ALT 23 1,943* 1,514* 889*  ALKPHOS 87 80 78 83  BILITOT 1.3* 2.3* 1.8* 1.8*  PROT 6.3* 5.2* 5.4*  5.6*  ALBUMIN 2.0* 1.8* 1.9* 1.9*   No results for input(s): LIPASE, AMYLASE in the last 168 hours. No results for input(s): AMMONIA in the last 168 hours.  ABG    Component Value Date/Time   PHART 7.337 (L) 06/13/2020 1221   PCO2ART 43.7 06/13/2020 1221   PO2ART 143 (H) 06/13/2020 1221   HCO3 23.4 06/13/2020 1221   TCO2 25 06/13/2020 1221   ACIDBASEDEF 2.0 06/13/2020 1221   O2SAT 99.0 06/13/2020 1221   Coagulation Profile: Recent Labs  Lab 06/12/20 1144  INR 1.3*    Cardiac Enzymes: No results for input(s): CKTOTAL, CKMB, CKMBINDEX, TROPONINI in the last 168 hours.  HbA1C: Hgb A1c MFr Bld  Date/Time Value Ref Range Status  06/10/2020 04:05 PM 9.6 (H) 4.8 - 5.6 % Final    Comment:    (NOTE) Pre diabetes:          5.7%-6.4%  Diabetes:              >6.4%  Glycemic control for   <7.0% adults with diabetes    CBG: Recent Labs  Lab 06/13/20 1512 06/13/20 2012 06/13/20 2312 06/14/20 0506 06/14/20 0748  GLUCAP 159* 244* 260* 255* 267*   Review of Systems:   Negative except as noted above.   Past Medical History:  He,  has a past medical history of Asthma, Atrial fibrillation (HCC), Diabetes mellitus without complication (HCC), History of cardioversion, and Hypertension.   Surgical History:   Past Surgical History:  Procedure Laterality Date  . ABDOMINAL SURGERY    . INCISION AND DRAINAGE PERIRECTAL ABSCESS N/A 06/11/2020   Procedure: IRRIGATION AND DEBRIDEMENT PERINEAL WOUND;  Surgeon: Fritzi Mandes, MD;  Location[facility] OR;  Service: General;  Laterality: N/A;  . IRRIGATION AND DEBRIDEMENT ABSCESS N/A 06/10/2020   Procedure: IRRIGATION AND DEBRIDEMENT ABSCESS;  Surgeon: Fritzi Mandes, MD;  Location: [facility] OR;  Service: General;  Laterality: N/A;  . LAPAROSCOPIC GASTRIC SLEEVE RESECTION       Social History:   reports that he has been smoking. He has never used smokeless tobacco. He reports previous alcohol use. He reports that he does not use drugs.    Family History:  His family history is not on file.   Allergies Allergies  Allergen Reactions  . Bee Venom Anaphylaxis    Other reaction(s): Unknown Other reaction(s): Unknown Other reaction(s): Unknown   . Penicillins Hives and Anaphylaxis    unknown   . Sglt2 Inhibitors Other (See Comments)    Necrotizing infection of the perineum  . Codeine Other (See Comments)    Pt states that his mother told him he is allergic, but has taken this medication multiple ties with no problems.    . Other Hives     Home Medications  Prior to Admission medications   Medication Sig Start Date End Date Taking? Authorizing Provider  albuterol (VENTOLIN HFA) 108 (90 Base) MCG/ACT inhaler Inhale 2 puffs into the lungs 4 (four) times daily as needed for shortness of breath or  wheezing. 12/06/13  Yes [provider]  amiodarone (PACERONE) 200 MG tablet Take 1 tablet by mouth 2 (two) times daily. 01/17/15  Yes [provider]  APPLE CIDER VINEGAR PO Take 1 tablet by mouth daily.   Yes [provider]  atorvastatin (LIPITOR) 20 MG tablet Take 20 mg by mouth every evening. 04/11/19  Yes [provider]  Beta Carotene (VITAMIN A) 25000 UNIT capsule Take 25,000 Units by mouth daily.   Yes [provider]  Cholecalciferol 50 MCG (2000 UT) CAPS Take 2,000 Units by mouth daily. 06/02/19  Yes [provider]  ezetimibe (ZETIA) 10 MG tablet Take 10 mg by mouth daily. 04/05/20  Yes [provider]  levothyroxine (SYNTHROID) 75 MCG tablet Take 75 mcg by mouth daily. 04/03/20  Yes [provider]  liraglutide (VICTOZA) 18 MG/3ML SOPN Inject 1.8 mLs into the skin every evening. 04/28/14  Yes [provider]  lisinopril (ZESTRIL) 10 MG tablet Take 10 mg by mouth daily. 04/05/20  Yes [provider]  metFORMIN (GLUCOPHAGE) 500 MG tablet Take 500 mg by mouth in the morning and at bedtime. 04/03/20  Yes [provider]  metoprolol  tartrate (LOPRESSOR) 100 MG tablet Take 100 mg by mouth 2 (two) times daily. 04/05/20  Yes [provider]  Multiple Vitamins-Minerals (ALIVE MENS ENERGY PO) Take 1 tablet by mouth daily.   Yes [provider]  nortriptyline (PAMELOR) 25 MG capsule Take 25 mg by mouth in the morning and at bedtime. 03/13/20  Yes [provider]  nystatin (MYCOSTATIN/NYSTOP) powder Apply 1 application topically daily as needed (rash). 04/11/19  Yes [provider]  Omega-3 Fatty Acids (FISH OIL) 1000 MG CAPS Take 1,000 mg by mouth daily.   Yes [provider]  omeprazole (PRILOSEC) 20 MG capsule Take 20 mg by mouth in the morning and at bedtime. 03/25/20  Yes [provider]  polyethylene glycol powder (GLYCOLAX/MIRALAX) 17 GM/SCOOP powder Take 17 g by mouth daily as needed for mild constipation.   Yes [provider]  pregabalin (LYRICA) 300 MG capsule Take 300 mg by mouth in the morning and at bedtime. 03/13/20  Yes [provider]  XARELTO 20 MG TABS tablet Take 20 mg by mouth daily. 04/19/20  Yes [provider]  Zinc 220 (50 Zn) MG CAPS Take 220 mg by mouth daily.   Yes [provider]     Dr. Verdene LennertIulia Burel [facility] PGY-2  06/14/2020, 10:13 AM

## 2020-06-14 NOTE — Progress Notes (Signed)
ANTICOAGULATION CONSULT NOTE - Follow Up Consult  Pharmacy Consult for IV heparin Indication: atrial fibrillation  Allergies  Allergen Reactions  . Bee Venom Anaphylaxis    Other reaction(s): Unknown Other reaction(s): Unknown Other reaction(s): Unknown   . Penicillins Hives and Anaphylaxis    unknown   . Sglt2 Inhibitors Other (See Comments)    Necrotizing infection of the perineum  . Codeine Other (See Comments)    Pt states that his mother told him he is allergic, but has taken this medication multiple ties with no problems.    . Other Hives    Patient Measurements: Height: 5\' 10"  (177.8 cm) Weight: (!) 290.3 kg (640 lb) IBW/kg (Calculated) : 73 Heparin Dosing Weight: 149.7 kg  Vital Signs: Temp: 99.2 F (37.3 C) (01/14 1200) Temp Source: Esophageal (01/14 1200) BP: 112/53 (01/14 0700) Pulse Rate: 102 (01/14 0700)  Labs: Recent Labs    06/12/20 0405 06/12/20 0441 06/12/20 1144 06/12/20 1821 06/12/20 2138 06/13/20 0148 06/13/20 0410 06/13/20 1200 06/13/20 1221 06/13/20 2010 06/14/20 0451 06/14/20 1800  HGB 9.7*   < >  --   --   --   --  9.7*  --  9.9*  --  9.3*  --   HCT 30.0*   < >  --   --   --   --  30.2*  --  29.0*  --  29.1*  --   PLT 181  --   --   --   --   --  208  --   --   --  195  --   APTT  --   --   --  32  --  32  --   --   --   --   --   --   LABPROT  --   --  15.4*  --   --   --   --   --   --   --   --   --   INR  --   --  1.3*  --   --   --   --   --   --   --   --   --   HEPARINUNFRC  --   --   --   --   --  <0.10*  --    < >  --  0.15* 0.14* 0.25*  CREATININE 4.47*  --   --   --  4.89*  --  4.94*  --   --   --  4.12*  --    < > = values in this interval not displayed.    Estimated Creatinine Clearance: 49.6 mL/min (A) (by C-G formula based on SCr of 4.12 mg/dL (H)).   Assessment: 49YOM admitted with septic shock secondary to necrotizing fascitis. During this admission, the patient has been in atrial fibrillation with RVR, despite  trial of defibrillation; amiodarone was re-loaded while inpatient. Pt has PMH of atrial fibrillation, was on Xarelto and amiodarone PTA.   Hep lvl remains low at 0.25 - no issues with line/infusion or bleeding per RN. Rates and infusions verified by RPh.  CBC stable  Goal of Therapy:  Heparin level 0.3-0.7 units/ml Monitor platelets by anticoagulation protocol: Yes   Plan:  Heparin bolus 2000 units x1  Increase heparin infusion to 4300 units/hr (this will run as 2 bags at 2150 units/hr each - discussed with RN, verified by RPh) 6 hour level Daily hep lvl cbc F/u plans  to rturn to OR and/or return to xarelto   Thank you for allowing [place] to participate in this patients care.   Signe Colt, PharmD Please see amion for complete clinical pharmacist phone list. 06/14/2020 6:53 PM

## 2020-06-14 NOTE — Progress Notes (Signed)
ANTICOAGULATION CONSULT NOTE - Follow Up Consult  Pharmacy Consult for IV heparin Indication: atrial fibrillation  Allergies  Allergen Reactions  . Bee Venom Anaphylaxis    Other reaction(s): Unknown Other reaction(s): Unknown Other reaction(s): Unknown   . Penicillins Hives and Anaphylaxis    unknown   . Sglt2 Inhibitors Other (See Comments)    Necrotizing infection of the perineum  . Codeine Other (See Comments)    Pt states that his mother told him he is allergic, but has taken this medication multiple ties with no problems.    . Other Hives    Patient Measurements: Height: 6\' 5"  (195.6 cm) Weight: (!) 290.3 kg (640 lb) IBW/kg (Calculated) : 89.1 Heparin Dosing Weight: 149.7 kg  Vital Signs: Temp: 98 F (36.7 C) (01/14 0700) Temp Source: Esophageal (01/14 0700) BP: 112/53 (01/14 0700) Pulse Rate: 102 (01/14 0700)  Labs: Recent Labs    06/12/20 0405 06/12/20 0441 06/12/20 1144 06/12/20 1821 06/12/20 2138 06/13/20 0148 06/13/20 0410 06/13/20 1200 06/13/20 1221 06/13/20 2010 06/14/20 0451  HGB 9.7*   < >  --   --   --   --  9.7*  --  9.9*  --  9.3*  HCT 30.0*   < >  --   --   --   --  30.2*  --  29.0*  --  29.1*  PLT 181  --   --   --   --   --  208  --   --   --  195  APTT  --   --   --  32  --  32  --   --   --   --   --   LABPROT  --   --  15.4*  --   --   --   --   --   --   --   --   INR  --   --  1.3*  --   --   --   --   --   --   --   --   HEPARINUNFRC  --    < >  --   --   --  <0.10*  --  0.10*  --  0.15* 0.14*  CREATININE 4.47*  --   --   --  4.89*  --  4.94*  --   --   --   --    < > = values in this interval not displayed.    Estimated Creatinine Clearance: 43.9 mL/min (A) (by C-G formula based on SCr of 4.94 mg/dL (H)).   Assessment: 49YOM admitted with septic shock secondary to necrotizing fascitis. During this admission, the patient has been in atrial fibrillation with RVR, despite trial of defibrillation; amiodarone was re-loaded while  inpatient. Pt has PMH of atrial fibrillation, was on Xarelto and amiodarone PTA.   Hep lvl remains low at 0.14 - no bleeding  CBC stable  Goal of Therapy:  Heparin level 0.3-0.7 units/ml Monitor platelets by anticoagulation protocol: Yes   Plan:  Heparin bolus 4000 units x1  Increase heparin infusion to 4000 units/hr (this will run as 2 bags at 2000 units/hr each - discussed with RN) 1600 HL Daily hep lvl cbc F/u plans to rturn to OR and/or return to xarelto  06/16/20, PharmD, BCPS, BCCCP Clinical Pharmacist [phone]  Please check AMION for all [facility] Pharmacy numbers  06/14/2020 8:17 AM

## 2020-06-14 NOTE — Progress Notes (Signed)
3 Days Post-Op  Subjective: Slowly weaning pressors, but remains on levo, vaso and neo. Tube feeds started yesterday. Transaminases downtrending.    Objective: Vital signs in last 24 hours: Temp:  [98 F (36.7 C)-99.6 F (37.6 C)] 98 F (36.7 C) (01/14 0700) Pulse Rate:  [102-112] 102 (01/14 0700) Resp:  [14-27] 22 (01/14 0800) BP: (104-112)/(48-53) 112/53 (01/14 0700) SpO2:  [94 %-100 %] 98 % (01/14 0808) Arterial Line BP: (78-126)/(35-60) 116/56 (01/14 0800) FiO2 (%):  [40 %-80 %] 40 % (01/14 0808) Weight:  [290.3 kg] 290.3 kg (01/14 0500) Last BM Date: 06/13/20 (disembacted by [facility])  Intake/Output from previous day: 01/13 0701 - 01/14 0700 In: 5215.8 [I.V.:2884.2; NG/GT:2151.7; IV Piggyback:149.9] Out: 4050 [Urine:4050] Intake/Output this shift: Total I/O In: -  Out: 1300 [Urine:1300]  PE: General: intubated, sedate HEENT: ETT in place Resp: intubated, on vent Vent Mode: PRVC FiO2 (%):  [40 %-60 %] 40 % Set Rate:  [26 bmp-30 bmp] 26 bmp Vt Set:  [600 mL] 600 mL PEEP:  [10 cmH20] 10 cmH20 Plateau Pressure:  [23 cmH20-36 cmH20] 36 cmH20 CV: RRR Abdomen: soft, nondistended Perirectal/perineal wound: Wound bed is overall clean and dry, with a small amount of superficial dusky nonviable tissue on the left buttock. This was sharply debrided at bedside with underlying healthy vascularized tissue. Extremities: bilateral does are dusky in appearance. Palpable pedal pulses bilaterally.   Lab Results:  Recent Labs    06/13/20 0410 06/13/20 1221 06/14/20 0451  WBC 15.3*  --  17.4*  HGB 9.7* 9.9* 9.3*  HCT 30.2* 29.0* 29.1*  PLT 208  --  195   BMET Recent Labs    06/13/20 0410 06/13/20 1221 06/14/20 0451  NA 134* 135 134*  K 4.2 3.9 4.4  CL 98  --  100  CO2 20*  --  20*  GLUCOSE 183*  --  272*  BUN 66*  --  75*  CREATININE 4.94*  --  4.12*  CALCIUM 7.2*  --  7.3*   PT/INR Recent Labs    06/12/20 1144  LABPROT 15.4*  INR 1.3*   CMP      Component Value Date/Time   NA 134 (L) 06/14/2020 0451   K 4.4 06/14/2020 0451   CL 100 06/14/2020 0451   CO2 20 (L) 06/14/2020 0451   GLUCOSE 272 (H) 06/14/2020 0451   BUN 75 (H) 06/14/2020 0451   CREATININE 4.12 (H) 06/14/2020 0451   CALCIUM 7.3 (L) 06/14/2020 0451   PROT 5.6 (L) 06/14/2020 0451   ALBUMIN 1.9 (L) 06/14/2020 0451   AST 274 (H) 06/14/2020 0451   ALT 889 (H) 06/14/2020 0451   ALKPHOS 83 06/14/2020 0451   BILITOT 1.8 (H) 06/14/2020 0451   GFRNONAA 17 (L) 06/14/2020 0451     Assessment/Plan 50 yo male with septic shock secondary to perianal/perineal necrotizing soft tissue infection, s/p operative debridement x2. - Wound examined at bedside today. There was some superficial nonviable tissue that was sharply debrided. Underlying tissue was viable and well-vascularized.  - Continue saline wet-to-dry dressings, currently being changed daily. Rectal tube in place to minimize contamination of the wound with stool. - Given continued high pressor requirement, recommend keeping feeds at trophic rate to minimize risk of bowel ischemia. Cortrak being placed as patient had an episode of emesis this morning with OG feeds. - Continue antibiotics. Intraoperative cultures growing multiple flora - VTE: on heparin gtt for a-fib - Surgery will continue to follow closely   LOS: 4 days  Sophronia Simas, MD [facility] Surgery General, Hepatobiliary and Pancreatic Surgery 06/14/20 9:33 AM

## 2020-06-14 NOTE — Procedures (Signed)
Cortrak  Person Inserting Tube:  Callie Bunyard, Verdon Cummins, RD Tube Type:  Cortrak - 43 inches Tube Location:  Left nare Initial Placement:  Stomach Secured by: Bridle Technique Used to Measure Tube Placement:  Documented cm marking at nare/ corner of mouth Cortrak Secured At:  71 cm    Cortrak Tube Team Note:  Consult received to place a postpyloric Cortrak feeding tube. RD unable to advance tube beyond the pylorus. RD bridled tube at 71cm and placed consult for IR to advance tube postpyloric. Confirmed with RN that pt is to have this done by IR today. Cortrak team available to bridle Cortrak after it is advanced if needed.   If the tube becomes dislodged please keep the tube and contact the Cortrak team at [email or web address] ([email or web address]) for replacement.  If after hours and replacement cannot be delayed, place a NG tube and confirm placement with an abdominal x-ray.    Eugene Gavia, MS, RD, LDN RD pager number and weekend/on-call pager number located in [place].

## 2020-06-14 NOTE — Progress Notes (Signed)
Notified by provider patient had vomited and verbal order given to hold tube feeds for now.

## 2020-06-15 ENCOUNTER — Inpatient Hospital Stay (HOSPITAL_COMMUNITY): Payer: Medicaid Other

## 2020-06-15 DIAGNOSIS — A419 Sepsis, unspecified organism: Secondary | ICD-10-CM | POA: Diagnosis not present

## 2020-06-15 DIAGNOSIS — J9601 Acute respiratory failure with hypoxia: Secondary | ICD-10-CM | POA: Diagnosis not present

## 2020-06-15 DIAGNOSIS — R6521 Severe sepsis with septic shock: Secondary | ICD-10-CM | POA: Diagnosis not present

## 2020-06-15 LAB — CULTURE, BLOOD (SINGLE)
Culture: NO GROWTH
Special Requests: ADEQUATE

## 2020-06-15 LAB — HEPARIN LEVEL (UNFRACTIONATED)
Heparin Unfractionated: 0.37 IU/mL (ref 0.30–0.70)
Heparin Unfractionated: 0.4 IU/mL (ref 0.30–0.70)

## 2020-06-15 LAB — POCT I-STAT 7, (LYTES, BLD GAS, ICA,H+H)
Acid-Base Excess: 2 mmol/L (ref 0.0–2.0)
Bicarbonate: 29.4 mmol/L — ABNORMAL HIGH (ref 20.0–28.0)
Calcium, Ion: 1.09 mmol/L — ABNORMAL LOW (ref 1.15–1.40)
HCT: 29 % — ABNORMAL LOW (ref 39.0–52.0)
Hemoglobin: 9.9 g/dL — ABNORMAL LOW (ref 13.0–17.0)
O2 Saturation: 96 %
Patient temperature: 98.6
Potassium: 4 mmol/L (ref 3.5–5.1)
Sodium: 140 mmol/L (ref 135–145)
TCO2: 31 mmol/L (ref 22–32)
pCO2 arterial: 57.4 mmHg — ABNORMAL HIGH (ref 32.0–48.0)
pH, Arterial: 7.318 — ABNORMAL LOW (ref 7.350–7.450)
pO2, Arterial: 90 mmHg (ref 83.0–108.0)

## 2020-06-15 LAB — GLUCOSE, CAPILLARY
Glucose-Capillary: 187 mg/dL — ABNORMAL HIGH (ref 70–99)
Glucose-Capillary: 180 mg/dL — ABNORMAL HIGH (ref 70–99)
Glucose-Capillary: 196 mg/dL — ABNORMAL HIGH (ref 70–99)
Glucose-Capillary: 212 mg/dL — ABNORMAL HIGH (ref 70–99)
Glucose-Capillary: 162 mg/dL — ABNORMAL HIGH (ref 70–99)
Glucose-Capillary: 163 mg/dL — ABNORMAL HIGH (ref 70–99)

## 2020-06-15 LAB — COMPREHENSIVE METABOLIC PANEL
ALT: 532 U/L — ABNORMAL HIGH (ref 0–44)
AST: 96 U/L — ABNORMAL HIGH (ref 15–41)
Albumin: 1.9 g/dL — ABNORMAL LOW (ref 3.5–5.0)
Alkaline Phosphatase: 77 U/L (ref 38–126)
Anion gap: 12 (ref 5–15)
BUN: 65 mg/dL — ABNORMAL HIGH (ref 6–20)
CO2: 24 mmol/L (ref 22–32)
Calcium: 7.8 mg/dL — ABNORMAL LOW (ref 8.9–10.3)
Chloride: 103 mmol/L (ref 98–111)
Creatinine, Ser: 2.82 mg/dL — ABNORMAL HIGH (ref 0.61–1.24)
GFR, Estimated: 27 mL/min — ABNORMAL LOW (ref >60)
Glucose, Bld: 193 mg/dL — ABNORMAL HIGH (ref 70–99)
Potassium: 4.2 mmol/L (ref 3.5–5.1)
Sodium: 139 mmol/L (ref 135–145)
Total Bilirubin: 1.7 mg/dL — ABNORMAL HIGH (ref 0.3–1.2)
Total Protein: 5.8 g/dL — ABNORMAL LOW (ref 6.5–8.1)

## 2020-06-15 LAB — CBC
HCT: 29.5 % — ABNORMAL LOW (ref 39.0–52.0)
Hemoglobin: 9.4 g/dL — ABNORMAL LOW (ref 13.0–17.0)
MCH: 28.7 pg (ref 26.0–34.0)
MCHC: 31.9 g/dL (ref 30.0–36.0)
MCV: 89.9 fL (ref 80.0–100.0)
Platelets: 236 10*3/uL (ref 150–400)
RBC: 3.28 MIL/uL — ABNORMAL LOW (ref 4.22–5.81)
RDW: 16.5 % — ABNORMAL HIGH (ref 11.5–15.5)
WBC: 22.1 10*3/uL — ABNORMAL HIGH (ref 4.0–10.5)
nRBC: 2.6 % — ABNORMAL HIGH (ref 0.0–0.2)

## 2020-06-15 LAB — TRIGLYCERIDES: Triglycerides: 124 mg/dL (ref <150)

## 2020-06-15 MED ORDER — MIDAZOLAM HCL 2 MG/2ML IJ SOLN
4.0000 mg | Freq: Once | INTRAMUSCULAR | Status: AC
  Administered 2020-06-15: 4 mg via INTRAVENOUS
  Filled 2020-06-15: qty 4

## 2020-06-15 MED ORDER — BISACODYL 10 MG RE SUPP: 10.0000 mg | Freq: Every day | RECTAL | Status: DC | PRN

## 2020-06-15 MED ORDER — LORAZEPAM 2 MG/ML IJ SOLN: 4.0000 mg | Freq: Once | INTRAMUSCULAR | Status: DC

## 2020-06-15 NOTE — Progress Notes (Addendum)
ANTICOAGULATION CONSULT NOTE - Follow Up Consult  Pharmacy Consult for IV heparin Indication: atrial fibrillation  Allergies  Allergen Reactions  . Bee Venom Anaphylaxis    Other reaction(s): Unknown Other reaction(s): Unknown Other reaction(s): Unknown   . Penicillins Hives and Anaphylaxis    unknown   . Sglt2 Inhibitors Other (See Comments)    Necrotizing infection of the perineum  . Codeine Other (See Comments)    Pt states that his mother told him he is allergic, but has taken this medication multiple ties with no problems.    . Other Hives    Patient Measurements: Height: 5\' 10"  (177.8 cm) Weight: (!) 290.3 kg (640 lb) IBW/kg (Calculated) : 73 Heparin Dosing Weight: 149.7 kg  Vital Signs: Temp: 99.1 F (37.3 C) (01/15 0400) Temp Source: Core (01/15 0400) Pulse Rate: 100 (01/15 0339)  Labs: Recent Labs    06/12/20 1144 06/12/20 1821 06/12/20 2138 06/13/20 0148 06/13/20 0410 06/13/20 1200 06/13/20 1221 06/13/20 2010 06/14/20 0451 06/14/20 1800 06/15/20 0555  HGB  --   --    < >  --  9.7*  --  9.9*  --  9.3*  --  9.4*  HCT  --   --    < >  --  30.2*  --  29.0*  --  29.1*  --  29.5*  PLT  --   --   --   --  208  --   --   --  195  --  236  APTT  --  32  --  32  --   --   --   --   --   --   --   LABPROT 15.4*  --   --   --   --   --   --   --   --   --   --   INR 1.3*  --   --   --   --   --   --   --   --   --   --   HEPARINUNFRC  --   --   --  <0.10*  --    < >  --    < > 0.14* 0.25* 0.37  CREATININE  --   --    < >  --  4.94*  --   --   --  4.12*  --  2.82*   < > = values in this interval not displayed.    Estimated Creatinine Clearance: 72.5 mL/min (A) (by C-G formula based on SCr of 2.82 mg/dL (H)).   Assessment: 49YOM admitted with septic shock secondary to necrotizing fascitis. During this admission, the patient has been in atrial fibrillation with RVR, despite trial of defibrillation; amiodarone was re-loaded while inpatient. Pt has PMH of  atrial fibrillation, was on Xarelto and amiodarone PTA.   The patient's heparin level this morning is therapeutic (HL 0.37 << 0.25, goal of 0.3-0.7). CBC is stable. The patient is requiring two heparin bags to achieve a therapeutic rate - this has been double checked and verified by pharmacy. Will recheck a heparin level this afternoon to confirm therapeutic. No bleeding or issues noted per RN.  Addendum: The patient's confirmatory heparin level remains therapeutic at 0.4. Will check levels twice daily while on such high rates - next check with AM labs on 1/16. Continue current rate for now.  06/15/2020  12:21 PM    Goal of Therapy:  Heparin level 0.3-0.7 units/ml  Monitor platelets by anticoagulation protocol: Yes   Plan:  - Continue Heparin at 4300 units/hr (this will run as 2 bags at 2150 units/hr each - discussed with RN, verified by RPh) - Daily HL, CBC - Will continue to monitor for any signs/symptoms of bleeding and will follow up with heparin level in 6 hours to confirm therapeutic  Thank you for allowing pharmacy to be a part of this patient's care.  Georgina Pillion, PharmD, BCPS Clinical Pharmacist Clinical phone for 06/15/2020: [phone] 06/15/2020 7:48 AM   **Pharmacist phone directory can now be found on [email or web address] (PW [email or web address]).  Listed under [facility].

## 2020-06-15 NOTE — Progress Notes (Signed)
4 Days Post-Op  Subjective: Slowly weaning pressors, but remains on levo, vaso and neo. Tube feeds held yesterday due to emesis and KUB was ordered this AM. Transaminases downtrending and creatinine improving.   Objective: Vital signs in last 24 hours: Temp:  [98.5 F (36.9 C)-99.2 F (37.3 C)] 98.9 F (37.2 C) (01/15 0800) Pulse Rate:  [88-103] 103 (01/15 0750) Resp:  [12-30] 26 (01/15 0815) SpO2:  [82 %-100 %] 98 % (01/15 0938) Arterial Line BP: (90-141)/(44-71) 133/59 (01/15 0815) FiO2 (%):  [40 %-60 %] 50 % (01/15 0935) Last BM Date: 06/13/20  Intake/Output from previous day: 01/14 0701 - 01/15 0700 In: 3523.3 [I.V.:2933.5; NG/GT:490; IV Piggyback:99.9] Out: 7700 [Urine:7700] Intake/Output this shift: Total I/O In: 449.1 [I.V.:449.1] Out: 1800 [Urine:1800]  PE: General: intubated, sedate HEENT: ETT in place Resp: intubated, on vent Vent Mode: PCV FiO2 (%):  [40 %-60 %] 50 % Set Rate:  [26 bmp] 26 bmp Vt Set:  [580 mL-600 mL] 580 mL PEEP:  [8 cmH20-10 cmH20] 10 cmH20 Plateau Pressure:  [21 cmH20-36 cmH20] 21 cmH20 CV: RRR Abdomen: soft, obese nontender Perirectal/perineal wound: Wound bed is overall pink, clean and dry, with ~6x6 cm area of purulent exudate just cephalad to the rectum. Wound drainage is serosanguinous. There is no surrounding blanching erythema. Rectal tube remains in place draining clear/brown liquid stool.     Extremities: bilateral toes are dusky in appearance. Palpable pedal pulses bilaterally.   Lab Results:  Recent Labs    06/14/20 0451 06/15/20 0555 06/15/20 0927  WBC 17.4* 22.1*  --   HGB 9.3* 9.4* 9.9*  HCT 29.1* 29.5* 29.0*  PLT 195 236  --    BMET Recent Labs    06/14/20 0451 06/15/20 0555 06/15/20 0927  NA 134* 139 140  K 4.4 4.2 4.0  CL 100 103  --   CO2 20* 24  --   GLUCOSE 272* 193*  --   BUN 75* 65*  --   CREATININE 4.12* 2.82*  --   CALCIUM 7.3* 7.8*  --    PT/INR Recent Labs    06/12/20 1144   LABPROT 15.4*  INR 1.3*   CMP     Component Value Date/Time   NA 140 06/15/2020 0927   K 4.0 06/15/2020 0927   CL 103 06/15/2020 0555   CO2 24 06/15/2020 0555   GLUCOSE 193 (H) 06/15/2020 0555   BUN 65 (H) 06/15/2020 0555   CREATININE 2.82 (H) 06/15/2020 0555   CALCIUM 7.8 (L) 06/15/2020 0555   PROT 5.8 (L) 06/15/2020 0555   ALBUMIN 1.9 (L) 06/15/2020 0555   AST 96 (H) 06/15/2020 0555   ALT 532 (H) 06/15/2020 0555   ALKPHOS 77 06/15/2020 0555   BILITOT 1.7 (H) 06/15/2020 0555   GFRNONAA 27 (L) 06/15/2020 0555     Assessment/Plan 50 yo male with septic shock secondary to perianal/perineal necrotizing soft tissue infection, s/p operative debridement x2. - Wound examined at bedside today. No sharp debridement was required. Underlying tissue was viable and well-vascularized.  - Continue saline wet-to-dry dressings, currently being changed daily. Rectal tube in place to minimize contamination of the wound with stool - BMx1 documented yesterday, liquid stool in rectal tube during my exam. - Given continued high pressor requirement, recommend keeping feeds at trophic rate to minimize risk of bowel ischemia if possible. Cortrak being was placed 1/14 after episode of emesis this morning with OG feeds. Will follow results of KUB.  - Continue antibiotics. Intraoperative cultures growing  E.Coli (pansensivitie), Strep infantarius, strep constellatus  - VTE: on heparin gtt for a-fib - Surgery will continue to follow closely   LOS: 5 days    Hosie Spangle, [facility]  06/15/20 11:05 AM

## 2020-06-15 NOTE — Progress Notes (Signed)
NAME:  Carl Hill, MRN:  [id number], DOB:  05-24-1971, LOS: 5 ADMISSION DATE:  06/10/2020, CONSULTATION DATE:  06/10/2020 REFERRING MD:  Ophelia Charter, CHIEF COMPLAINT:  Severe Sepsis    Brief History:  50 year old white male admitted with necrotizing fasciitis of the left buttock, left upper thigh, and perineum, with associated severe sepsis/septic shock. Critical care consulted  History of Present Illness:  This is a 50 year old chronically ill-appearing obese white male currently lying in bed, lethargic but not in acute distress.   Presented to the emergency room accompanied by his wife the a.m. of 1/10 for evaluation of hypoglycemia, Malaise and fatigue.  Patient had been in his usual state of health until approximately 1 week prior to presentation at which time his wife started to notice he had 2 boils on his left buttocks near the left gluteal fold.  She initially dressed these, after cleaning them.  The patient had progressive pain in the left buttocks, decreased activity, and then wife started noting more hyperglycemia with blood glucose as high as the 400s and because of this he presented to the emergency room.  On arrival to the emergency room he was found to be febrile, his blood glucose was in the 300s, white blood cell count over 17,000, he had a mild lactic acidosis of 3.6, he was tachycardic and hypotensive.   Cultures were sent, CT imaging obtained of the pelvis showing extensive subcutaneous emphysema in the medial aspect of the upper thigh extending throughout the perineum and left medial buttocks with gas extending into the pelvis surrounding the anus.  General surgery was consulted emergently broad-spectrum antibiotic started IV fluid administered critical care consulted and concern for progressive clinical decline  Past Medical History:  HTN, obesity, DM, afib on DOAC  Significant Hospital Events:  06/10/20: Admitted to [facility]. General surgery consulted and debridement pursued. In  the afternoon, patient developed hypotension with AF in RVR. Synchronized cardioversion unsuccessful with conversion to VT. CPR initated with ROSC after desynchronized defibrillation.  Consults:  General surgery, PCCM  Procedures:  06/10/2020: Excisional debridement  06/11/2020: Excisional debridement   Significant Diagnostic Tests:   CT abdomen/pelvis (06/10/2020) >>  Extensive subcutaneous emphysema in the medial aspect of the upper thighs extending through perineum and superiorly to the medial left buttock. Gas extends into the pelvis surrounding the anus inferiorly and tracking superiorly along the left wall of the rectum. No definite visualized fluid collections. 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  Micro Data:  Blood cultures 1/10 >> NGTD @ 3 day Right Gluteal deep wound culture 1/10 >> Rare E. Coli, Rare Strep agalactiae, Rare strep Infantarius, Rare Strep Constellatus. No anaerobes detected.  Left Gluteal deep wound culture 1/10 >> Rare GBS  Antimicrobials:  Aztreonam 1/09 Metronidazole 1/09 Vancomycin 1/09, 1/11 Cefepime 1/10 - 1/13  Clindamycin 1/10 -1/13 Ceftriaxone 1/13 >>  Interim History / Subjective:   Wound care yesterday by CCS and planning for repeat again 1/15.  Requires fentanyl, Versed to tolerate Peak pressure alarming frequently Emesis this morning, tube feeding held No bowel movement since admission per RN  Objective   Blood pressure (!) 112/53, pulse (!) 103, temperature 98.9 F (37.2 C), temperature source Esophageal, resp. rate (!) 26, height 5\' 10"  (1.778 m), weight (!) 290.3 kg, SpO2 (!) 82 %.    Vent Mode: PRVC FiO2 (%):  [40 %-60 %] 60 % Set Rate:  [26 bmp] 26 bmp Vt Set:  [580 mL-600 mL] 580 mL PEEP:  [  10 cmH20] 10 cmH20 Plateau Pressure:  [22 cmH20-36 cmH20] 24 cmH20   Intake/Output Summary (Last 24 hours) at 06/15/2020 0816 Last data filed at 06/15/2020 0753 Gross per 24  hour  Intake 3345.2 ml  Output 7200 ml  Net -3854.8 ml   Filed Weights   06/11/20 0445 06/13/20 0446 06/14/20 0500  Weight: (!) 303.5 kg (!) 296.7 kg (!) 290.3 kg   Examination: GEN: Morbidly obese man, ventilated, somewhat uncomfortable even on sedation HEENT ET tube in good position, oropharynx clear, pupils small, equal, sluggish Neck: Large neck, unable to assess JVP Lungs very distant, no crackles, no wheezes.  Peak pressuring on PRVC CV: Tachycardic, distant, no murmur Abdomen morbidly obese, nondistended, no bowel sounds heard Extremities: Trace pretibial edema, cool Neuro: Turns to voice, nods to questions, follows commands   Assessment & Plan:   # Septic Shock: Pressor support still high but overall improving.  # Necrotizing Fascitis of the Left Buttocks s/p Debridement: No current plans to go back to OR. -Norepinephrine down to 3, phenylephrine up to 290, vasopressin 0.03.  Goal norepinephrine off -Continue ceftriaxone as ordered, duration unclear, will depend on progress with his wound -Greatly appreciate surgery management, wound evaluation and dressing changes  # A.fib with RVR: Previously on Amiodarone and Xarelto at home. RVR in the setting of acute infection. Synchronized cardioversion unsuccessful. Heart rate is stable overnight and into this morning in the low 100s, occasionally improving to the 80-90s.  # VT Arrest (06/10/20): Complication of synchronized cardioversion. ROSC obtained within seconds to minutes.  -Continue amiodarone infusion, heparin infusion -Working to transition norepinephrine to phenylephrine  # Acute Hypoxic and Hypercapnic Respiratory Failure: In the setting of metabolic encephalopathy and cardiac arrest.   -Plan transition to pressure control ventilation 1/15 for patient comfort, to avoid peak pressure alarming -Okay to initiate PSV, SBT's but I am hesitant to extubate because he has been requiring significant pain control, sedation in order  to facilitate his wound care.  #Emesis, suspected ileus -Tube feedings currently on hold -KUB today -Bowel regimen > Colace, senna, scheduled MiraLAX.  Add Dulcolax suppository  # Hyperglycemia: In the setting of infection. Since transition to subcutaneous insulin, average CBGs in the 250s. Will up-titrate Lantus.  # Type 2 Diabetes Mellitus  -Continue Levemir 55 units -NovoLog 15 units every 4 hours, hold for CBG <140 -Sliding scale insulin resistant scale  # Acute Kidney Injury: Improving # Hyponatremia: Improving -Follow BMP, urine output -Avoid nephrotoxins, renal dose medications  # Acute Liver Injury: Likely shock liver.  Continues to improve -Follow LFT intermittently  #Hypothyroidism -Synthroid as ordered  # Metabolic Acidosis: Resolved  # Acute Toxic Metabolic Encephalopathy: Improved.  Still with fentanyl and sedation requirement which are contributing to mental status -Minimize sedating medication as able although we will continue to require to facilitate his dressing changes, wound care  Best practice (evaluated daily)  Diet: Tube Feeds currently on hold given possible ileus Pain/Anxiety/Delirium protocol (if indicated): Fentanyl  VAP protocol (if indicated): Ordered DVT prophylaxis: Heparin gtt  GI prophylaxis: Protonix  Glucose control:  insulin  Bowel regimen: Docusate BID, Senna BID.  Mobility: Bedrest Disposition: ICU  Labs   CBC: Recent Labs  Lab 06/10/20 0244 06/10/20 1710 06/11/20 0819 06/11/20 0939 06/12/20 0405 06/12/20 0441 06/13/20 0410 06/13/20 1221 06/14/20 0451 06/15/20 0555  WBC 17.6*  --  10.9*  --  15.2*  --  15.3*  --  17.4* 22.1*  NEUTROABS 15.4*  --   --   --   --   --   --   --   --   --  HGB 12.3*   < > 10.2*   < > 9.7* 11.9* 9.7* 9.9* 9.3* 9.4*  HCT 38.5*   < > 30.8*   < > 30.0* 35.0* 30.2* 29.0* 29.1* 29.5*  MCV 88.3  --  87.0  --  87.2  --  88.0  --  89.3 89.9  PLT 199  --  200  --  181  --  208  --  195 236   < > =  values in this interval not displayed.   Basic Metabolic Panel: Recent Labs  Lab 06/12/20 0405 06/12/20 0441 06/12/20 1144 06/12/20 1821 06/12/20 2138 06/13/20 0410 06/13/20 1221 06/13/20 1700 06/14/20 0451 06/15/20 0555  NA 134*   < >  --   --  134* 134* 135  --  134* 139  K 3.8   < >  --   --  4.2 4.2 3.9  --  4.4 4.2  CL 98  --   --   --  98 98  --   --  100 103  CO2 21*  --   --   --  20* 20*  --   --  20* 24  GLUCOSE 158*  --   --   --  149* 183*  --   --  272* 193*  BUN 51*  --   --   --  61* 66*  --   --  75* 65*  CREATININE 4.47*  --   --   --  4.89* 4.94*  --   --  4.12* 2.82*  CALCIUM 7.1*  --   --   --  7.2* 7.2*  --   --  7.3* 7.8*  MG 1.6*  --  1.8 1.6* 1.7 2.0  --  1.9  --   --   PHOS  --   --  5.3* 5.5*  --  5.8*  --  6.4*  --   --    < > = values in this interval not displayed.   GFR: Estimated Creatinine Clearance: 72.5 mL/min (A) (by C-G formula based on SCr of 2.82 mg/dL (H)). Recent Labs  Lab 06/10/20 0244 06/10/20 1550 06/10/20 1600 06/11/20 0819 06/11/20 0836 06/12/20 0405 06/13/20 0410 06/14/20 0451 06/15/20 0555  PROCALCITON  --  3.10  --   --   --   --   --   --   --   WBC 17.6*  --   --    < >  --  15.2* 15.3* 17.4* 22.1*  LATICACIDVEN 3.7*  --  2.2*  --  1.4  --   --   --   --    < > = values in this interval not displayed.   Liver Function Tests: Recent Labs  Lab 06/10/20 0244 06/12/20 0405 06/13/20 0410 06/14/20 0451 06/15/20 0555  AST 25 3,261* 1,204* 274* 96*  ALT 23 1,943* 1,514* 889* 532*  ALKPHOS 87 80 78 83 77  BILITOT 1.3* 2.3* 1.8* 1.8* 1.7*  PROT 6.3* 5.2* 5.4* 5.6* 5.8*  ALBUMIN 2.0* 1.8* 1.9* 1.9* 1.9*   No results for input(s): LIPASE, AMYLASE in the last 168 hours. No results for input(s): AMMONIA in the last 168 hours.  ABG    Component Value Date/Time   PHART 7.337 (L) 06/13/2020 1221   PCO2ART 43.7 06/13/2020 1221   PO2ART 143 (H) 06/13/2020 1221   HCO3 23.4 06/13/2020 1221   TCO2 25 06/13/2020 1221    ACIDBASEDEF 2.0 06/13/2020 1221  O2SAT 99.0 06/13/2020 1221   Coagulation Profile: Recent Labs  Lab 06/12/20 1144  INR 1.3*    Cardiac Enzymes: No results for input(s): CKTOTAL, CKMB, CKMBINDEX, TROPONINI in the last 168 hours.  HbA1C: Hgb A1c MFr Bld  Date/Time Value Ref Range Status  06/10/2020 04:05 PM 9.6 (H) 4.8 - 5.6 % Final    Comment:    (NOTE) Pre diabetes:          5.7%-6.4%  Diabetes:              >6.4%  Glycemic control for   <7.0% adults with diabetes    CBG: Recent Labs  Lab 06/14/20 1536 06/14/20 1936 06/14/20 2332 06/15/20 0336 06/15/20 0736  GLUCAP 184* 200* 186* 187* 180*   Independent critical care time 34 minutes  Levy Pupa, MD, PhD 06/15/2020, 9:54 AM [facility] [phone] or if no answer [phone]

## 2020-06-15 NOTE — Progress Notes (Signed)
PCCM Interval Note  Called and updated patient's mother this afternoon.   Levy Pupa, MD, PhD 06/15/2020, 5:20 PM  Pulmonary and Critical Care [phone] or if no answer [phone]

## 2020-06-15 NOTE — Progress Notes (Signed)
eLink Physician-Brief Progress Note Patient Name: Carl Hill DOB: 23-May-1971 MRN: [id number]   Date of Service  06/15/2020  HPI/Events of Note  Asking to hold TF, vomiting. KUB: Non obstructive bowel gas pattern. S/p I and D on 11 th for Necrotizing fascitis of perinneum. Shock.   eICU Interventions  OK to hold TF, due to vomiting for tonight, re assess in AM .       Intervention Category Intermediate Interventions: Other:  Ranee Gosselin 06/15/2020, 8:09 PM

## 2020-06-16 DIAGNOSIS — A419 Sepsis, unspecified organism: Secondary | ICD-10-CM | POA: Diagnosis not present

## 2020-06-16 DIAGNOSIS — N493 Fournier gangrene: Secondary | ICD-10-CM | POA: Diagnosis not present

## 2020-06-16 DIAGNOSIS — J9601 Acute respiratory failure with hypoxia: Secondary | ICD-10-CM | POA: Diagnosis not present

## 2020-06-16 LAB — HEPARIN LEVEL (UNFRACTIONATED)
Heparin Unfractionated: 0.38 IU/mL (ref 0.30–0.70)
Heparin Unfractionated: 0.51 IU/mL (ref 0.30–0.70)

## 2020-06-16 LAB — POCT I-STAT 7, (LYTES, BLD GAS, ICA,H+H)
Acid-Base Excess: 2 mmol/L (ref 0.0–2.0)
Bicarbonate: 30.6 mmol/L — ABNORMAL HIGH (ref 20.0–28.0)
Calcium, Ion: 1.14 mmol/L — ABNORMAL LOW (ref 1.15–1.40)
HCT: 29 % — ABNORMAL LOW (ref 39.0–52.0)
Hemoglobin: 9.9 g/dL — ABNORMAL LOW (ref 13.0–17.0)
O2 Saturation: 90 %
Patient temperature: 99.5
Potassium: 5 mmol/L (ref 3.5–5.1)
Sodium: 142 mmol/L (ref 135–145)
TCO2: 33 mmol/L — ABNORMAL HIGH (ref 22–32)
pCO2 arterial: 69.4 mmHg (ref 32.0–48.0)
pH, Arterial: 7.255 — ABNORMAL LOW (ref 7.350–7.450)
pO2, Arterial: 73 mmHg — ABNORMAL LOW (ref 83.0–108.0)

## 2020-06-16 LAB — AEROBIC/ANAEROBIC CULTURE W GRAM STAIN (SURGICAL/DEEP WOUND): Gram Stain: NONE SEEN

## 2020-06-16 LAB — CBC
HCT: 28.1 % — ABNORMAL LOW (ref 39.0–52.0)
Hemoglobin: 9 g/dL — ABNORMAL LOW (ref 13.0–17.0)
MCH: 29.6 pg (ref 26.0–34.0)
MCHC: 32 g/dL (ref 30.0–36.0)
MCV: 92.4 fL (ref 80.0–100.0)
Platelets: 249 10*3/uL (ref 150–400)
RBC: 3.04 MIL/uL — ABNORMAL LOW (ref 4.22–5.81)
RDW: 16.9 % — ABNORMAL HIGH (ref 11.5–15.5)
WBC: 22.6 10*3/uL — ABNORMAL HIGH (ref 4.0–10.5)
nRBC: 3.8 % — ABNORMAL HIGH (ref 0.0–0.2)

## 2020-06-16 LAB — GLUCOSE, CAPILLARY
Glucose-Capillary: 112 mg/dL — ABNORMAL HIGH (ref 70–99)
Glucose-Capillary: 133 mg/dL — ABNORMAL HIGH (ref 70–99)
Glucose-Capillary: 159 mg/dL — ABNORMAL HIGH (ref 70–99)
Glucose-Capillary: 168 mg/dL — ABNORMAL HIGH (ref 70–99)
Glucose-Capillary: 170 mg/dL — ABNORMAL HIGH (ref 70–99)
Glucose-Capillary: 174 mg/dL — ABNORMAL HIGH (ref 70–99)
Glucose-Capillary: 213 mg/dL — ABNORMAL HIGH (ref 70–99)

## 2020-06-16 LAB — BASIC METABOLIC PANEL
Anion gap: 10 (ref 5–15)
BUN: 56 mg/dL — ABNORMAL HIGH (ref 6–20)
CO2: 27 mmol/L (ref 22–32)
Calcium: 8.1 mg/dL — ABNORMAL LOW (ref 8.9–10.3)
Chloride: 103 mmol/L (ref 98–111)
Creatinine, Ser: 1.98 mg/dL — ABNORMAL HIGH (ref 0.61–1.24)
GFR, Estimated: 41 mL/min — ABNORMAL LOW (ref >60)
Glucose, Bld: 185 mg/dL — ABNORMAL HIGH (ref 70–99)
Potassium: 4.9 mmol/L (ref 3.5–5.1)
Sodium: 140 mmol/L (ref 135–145)

## 2020-06-16 LAB — MAGNESIUM: Magnesium: 1.6 mg/dL — ABNORMAL LOW (ref 1.7–2.4)

## 2020-06-16 LAB — TRIGLYCERIDES: Triglycerides: 133 mg/dL (ref <150)

## 2020-06-16 LAB — PHOSPHORUS: Phosphorus: 3.9 mg/dL (ref 2.5–4.6)

## 2020-06-16 MED ORDER — MAGNESIUM SULFATE 2 GM/50ML IV SOLN
2.0000 g | Freq: Once | INTRAVENOUS | Status: AC
  Administered 2020-06-16: 2 g via INTRAVENOUS
  Filled 2020-06-16: qty 50

## 2020-06-16 NOTE — Progress Notes (Signed)
ANTICOAGULATION CONSULT NOTE - Follow Up Consult  Pharmacy Consult for IV heparin Indication: atrial fibrillation  Allergies  Allergen Reactions  . Bee Venom Anaphylaxis    Other reaction(s): Unknown Other reaction(s): Unknown Other reaction(s): Unknown   . Penicillins Hives and Anaphylaxis    unknown   . Sglt2 Inhibitors Other (See Comments)    Necrotizing infection of the perineum  . Codeine Other (See Comments)    Pt states that his mother told him he is allergic, but has taken this medication multiple ties with no problems.    . Other Hives    Patient Measurements: Height: 5\' 10"  (177.8 cm) Weight: (!) 290.3 kg (640 lb) IBW/kg (Calculated) : 73 Heparin Dosing Weight: 149.7 kg  Vital Signs: Temp: 99.6 F (37.6 C) (01/16 0349) Temp Source: Axillary (01/16 0349) Pulse Rate: 129 (01/16 0322)  Labs: Recent Labs    06/14/20 0451 06/14/20 1800 06/15/20 0555 06/15/20 0927 06/15/20 1140 06/16/20 0437 06/16/20 0438  HGB 9.3*  --  9.4* 9.9*  --  9.0*  --   HCT 29.1*  --  29.5* 29.0*  --  28.1*  --   PLT 195  --  236  --   --  249  --   HEPARINUNFRC 0.14*   < > 0.37  --  0.40  --  0.38  CREATININE 4.12*  --  2.82*  --   --  1.98*  --    < > = values in this interval not displayed.    Estimated Creatinine Clearance: 103.2 mL/min (A) (by C-G formula based on SCr of 1.98 mg/dL (H)).   Assessment: 49YOM admitted with septic shock secondary to necrotizing fascitis. During this admission, the patient has been in atrial fibrillation with RVR, despite trial of defibrillation; amiodarone was re-loaded while inpatient. Pt has PMH of atrial fibrillation, was on Xarelto and amiodarone PTA.   The patient's heparin level of 0.38 this morning is therapeutic. The patient is requiring two heparin bags to achieve a therapeutic rate - this has been double checked and verified by pharmacy. Hgb 9.0. Plt wnl. Per RN no reported bleeding or issues with infusion.   Goal of Therapy:   Heparin level 0.3-0.7 units/ml Monitor platelets by anticoagulation protocol: Yes   Plan:  - Continue Heparin at 4300 units/hr (this will run as 2 bags at 2150 units/hr each - discussed with RN, verified visually by RPh) - Will continue to monitor for any signs/symptoms of bleeding and CBC daily  - Continue checking levels twice daily while on high rates (0500 and 1700)  Thank you for allowing pharmacy to be a part of this patient's care.   06/18/20, PharmD Clinical Pharmacist  06/16/2020 6:50 AM

## 2020-06-16 NOTE — Progress Notes (Signed)
NAME:  Carl Hill, MRN:  [id number], DOB:  1970/08/02, LOS: 6 ADMISSION DATE:  06/10/2020, CONSULTATION DATE:  06/10/2020 REFERRING MD:  Ophelia Charter, CHIEF COMPLAINT:  Severe Sepsis    Brief History:  50 year old white male admitted with necrotizing fasciitis of the left buttock, left upper thigh, and perineum, with associated severe sepsis/septic shock. Critical care consulted  History of Present Illness:  This is a 51 year old chronically ill-appearing obese white male currently lying in bed, lethargic but not in acute distress.   Presented to the emergency room accompanied by his wife the a.m. of 1/10 for evaluation of hypoglycemia, Malaise and fatigue.  Patient had been in his usual state of health until approximately 1 week prior to presentation at which time his wife started to notice he had 2 boils on his left buttocks near the left gluteal fold.  She initially dressed these, after cleaning them.  The patient had progressive pain in the left buttocks, decreased activity, and then wife started noting more hyperglycemia with blood glucose as high as the 400s and because of this he presented to the emergency room.  On arrival to the emergency room he was found to be febrile, his blood glucose was in the 300s, white blood cell count over 17,000, he had a mild lactic acidosis of 3.6, he was tachycardic and hypotensive.   Cultures were sent, CT imaging obtained of the pelvis showing extensive subcutaneous emphysema in the medial aspect of the upper thigh extending throughout the perineum and left medial buttocks with gas extending into the pelvis surrounding the anus.  General surgery was consulted emergently broad-spectrum antibiotic started IV fluid administered critical care consulted and concern for progressive clinical decline  Past Medical History:  HTN, obesity, DM, afib on DOAC  Significant Hospital Events:  06/10/20: Admitted to [facility]. General surgery consulted and debridement pursued. In  the afternoon, patient developed hypotension with AF in RVR. Synchronized cardioversion unsuccessful with conversion to VT. CPR initated with ROSC after desynchronized defibrillation.  Consults:  General surgery, PCCM  Procedures:  06/10/2020: Excisional debridement  06/11/2020: Excisional debridement   Significant Diagnostic Tests:   CT abdomen/pelvis (06/10/2020) >>  Extensive subcutaneous emphysema in the medial aspect of the upper thighs extending through perineum and superiorly to the medial left buttock. Gas extends into the pelvis surrounding the anus inferiorly and tracking superiorly along the left wall of the rectum. No definite visualized fluid collections. 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  Micro Data:  Blood cultures 1/10 >> NGTD @ 3 day Right Gluteal deep wound culture 1/10 >> Rare E. Coli, Rare Strep agalactiae, Rare strep Infantarius, Rare Strep Constellatus. No anaerobes detected.  Left Gluteal deep wound culture 1/10 >> Rare GBS  Antimicrobials:  Aztreonam 1/09 Metronidazole 1/09 Vancomycin 1/09, 1/11 Cefepime 1/10 - 1/13  Clindamycin 1/10 -1/13 Ceftriaxone 1/13 >>  Interim History / Subjective:   Agitation and discomfort, fentanyl uptitrated over the last 24 hours, currently 400 Have been able to wean phenylephrine, currently 85. Remains on vasopressin Had wound care and dressing changed by CCS this morning 1/16 Amiodarone and heparin running  Objective   Blood pressure 121/68, pulse (!) 114, temperature 100.1 F (37.8 C), temperature source Axillary, resp. rate 17, height 5\' 10"  (1.778 m), weight (!) 290.3 kg, SpO2 94 %.    Vent Mode: PCV FiO2 (%):  [50 %-60 %] 60 % Set Rate:  [26 bmp] 26 bmp PEEP:  [10 cmH20] 10 cmH20 Plateau Pressure:  [  20 cmH20-29 cmH20] 23 cmH20   Intake/Output Summary (Last 24 hours) at 06/16/2020 1023 Last data filed at 06/16/2020 16100638 Gross per 24 hour  Intake  3568.29 ml  Output 4260 ml  Net -691.71 ml   Filed Weights   06/11/20 0445 06/13/20 0446 06/14/20 0500  Weight: (!) 303.5 kg (!) 296.7 kg (!) 290.3 kg   Examination: GEN: Obese man, intubated, ill-appearing, uncomfortable HEENT ET tube in good position, oropharynx clear, pupils small Neck: Very large neck, unable to assess JVP Lungs distant, no wheeze, on pressure control ventilation CV: Tachycardic, distant, no murmur Abdomen morbidly obese, nondistended, hypoactive bowel sounds Extremities: Trace pretibial edema, cool Neuro: Turns head to voice, appears to track but will not follow commands, has some spontaneous movement of bilateral upper extremities but unclear whether this is purposeful   Assessment & Plan:   # Septic Shock: Pressor support still high but overall improving.  # Necrotizing Fascitis of the Left Buttocks s/p Debridement: No current plans to go back to OR. -Norepinephrine off, phenylephrine weaning. Remains on vasopressin -Continue ceftriaxone as ordered, duration unclear but will depend on progress with his wound -Greatly appreciate surgery management, wound care, dressing changes  # A.fib with RVR: Previously on Amiodarone and Xarelto at home. RVR in the setting of acute infection. Synchronized cardioversion unsuccessful. Heart rate is stable overnight and into this morning in the low 100s, occasionally improving to the 80-90s.  # VT Arrest (06/10/20): Complication of synchronized cardioversion. ROSC obtained within seconds to minutes.  -Continue amiodarone and heparin -Now off norepinephrine, weaning phenylephrine -Follow telemetry  # Acute Hypoxic and Hypercapnic Respiratory Failure: In the setting of metabolic encephalopathy and cardiac arrest.   -Continue pressure control ventilation. -His encephalopathy is a significant barrier to gas exchange, do not believe he is in a position to progress to SBT at this time. -Question whether he may ultimately require  tracheostomy to facilitate ventilator weaning, sedation weaning given his obesity, need for frequent dressing changes and pain control  #Emesis, KUB 1/15 without any overt evidence ileus -Reinitiate tube feeding and advance slowly -Bowel regimen Colace, senna, MiraLAX, Dulcolax. Would need a rectal tube when he begins to have stools to avoid contamination of his wound   # Hyperglycemia: In the setting of infection. Since transition to subcutaneous insulin, average CBGs in the 250s. Will up-titrate Lantus.  # Type 2 Diabetes Mellitus  -Levemir 55 units -NovoLog 15 units every 4 hours, hold for CBG less than 140 -Sliding scale insulin resistant scale  # Acute Kidney Injury: Used to improve 1/16 # Hyponatremia: Normalized -Follow BMP, urine output with restoration of adequate perfusion -Avoid nephrotoxins, renal dose medications  # Acute Liver Injury: Likely shock liver.  Continues to improve -Follow LFT intermittently  #Hypothyroidism -Synthroid as ordered  # Metabolic Acidosis: Resolved  # Acute Toxic Metabolic Encephalopathy:  -Had hoped to minimize any sedating medications and push for spontaneous breathing but clearly quite uncomfortable both due to MV and his wound, dressing changes. Believe that we will need to add propofol. This should allow us to wean fentanyl some. Hopefully we can lighten soon as wound care needs improve   Best practice (evaluated daily)  Diet: Tube Feeds try to restart 1/16 Pain/Anxiety/Delirium protocol (if indicated): Fentanyl, add propofol 1/16 VAP protocol (if indicated): Ordered DVT prophylaxis: Heparin gtt  GI prophylaxis: Protonix  Glucose control: [place] insulin  Bowel regimen: Docusate BID, Senna BID.  Mobility: Bedrest Disposition: ICU Family: discussed his status, active issues and care plan  with his mom by phone 1/16  Labs   CBC: Recent Labs  Lab 06/10/20 0244 06/10/20 1710 06/12/20 0405 06/12/20 0441 06/13/20 0410 06/13/20 1221  06/14/20 0451 06/15/20 0555 06/15/20 0927 06/16/20 0437  WBC 17.6*   < > 15.2*  --  15.3*  --  17.4* 22.1*  --  22.6*  NEUTROABS 15.4*  --   --   --   --   --   --   --   --   --   HGB 12.3*   < > 9.7*   < > 9.7* 9.9* 9.3* 9.4* 9.9* 9.0*  HCT 38.5*   < > 30.0*   < > 30.2* 29.0* 29.1* 29.5* 29.0* 28.1*  MCV 88.3   < > 87.2  --  88.0  --  89.3 89.9  --  92.4  PLT 199   < > 181  --  208  --  195 236  --  249   < > = values in this interval not displayed.   Basic Metabolic Panel: Recent Labs  Lab 06/12/20 1144 06/12/20 1821 06/12/20 2138 06/13/20 0410 06/13/20 1221 06/13/20 1700 06/14/20 0451 06/15/20 0555 06/15/20 0927 06/16/20 0437  NA  --   --  134* 134* 135  --  134* 139 140 140  K  --   --  4.2 4.2 3.9  --  4.4 4.2 4.0 4.9  CL  --   --  98 98  --   --  100 103  --  103  CO2  --   --  20* 20*  --   --  20* 24  --  27  GLUCOSE  --   --  149* 183*  --   --  272* 193*  --  185*  BUN  --   --  61* 66*  --   --  75* 65*  --  56*  CREATININE  --   --  4.89* 4.94*  --   --  4.12* 2.82*  --  1.98*  CALCIUM  --   --  7.2* 7.2*  --   --  7.3* 7.8*  --  8.1*  MG 1.8 1.6* 1.7 2.0  --  1.9  --   --   --  1.6*  PHOS 5.3* 5.5*  --  5.8*  --  6.4*  --   --   --  3.9   GFR: Estimated Creatinine Clearance: 103.2 mL/min (A) (by C-G formula based on SCr of 1.98 mg/dL (H)). Recent Labs  Lab 06/10/20 0244 06/10/20 1550 06/10/20 1600 06/11/20 0819 06/11/20 0836 06/12/20 0405 06/13/20 0410 06/14/20 0451 06/15/20 0555 06/16/20 0437  PROCALCITON  --  3.10  --   --   --   --   --   --   --   --   WBC 17.6*  --   --    < >  --    < > 15.3* 17.4* 22.1* 22.6*  LATICACIDVEN 3.7*  --  2.2*  --  1.4  --   --   --   --   --    < > = values in this interval not displayed.   Liver Function Tests: Recent Labs  Lab 06/10/20 0244 06/12/20 0405 06/13/20 0410 06/14/20 0451 06/15/20 0555  AST 25 3,261* 1,204* 274* 96*  ALT 23 1,943* 1,514* 889* 532*  ALKPHOS 87 80 78 83 77  BILITOT 1.3* 2.3*  1.8* 1.8* 1.7*  PROT 6.3*  5.2* 5.4* 5.6* 5.8*  ALBUMIN 2.0* 1.8* 1.9* 1.9* 1.9*   No results for input(s): LIPASE, AMYLASE in the last 168 hours. No results for input(s): AMMONIA in the last 168 hours.  ABG    Component Value Date/Time   PHART 7.318 (L) 06/15/2020 0927   PCO2ART 57.4 (H) 06/15/2020 0927   PO2ART 90 06/15/2020 0927   HCO3 29.4 (H) 06/15/2020 0927   TCO2 31 06/15/2020 0927   ACIDBASEDEF 2.0 06/13/2020 1221   O2SAT 96.0 06/15/2020 0927   Coagulation Profile: Recent Labs  Lab 06/12/20 1144  INR 1.3*    Cardiac Enzymes: No results for input(s): CKTOTAL, CKMB, CKMBINDEX, TROPONINI in the last 168 hours.  HbA1C: Hgb A1c MFr Bld  Date/Time Value Ref Range Status  06/10/2020 04:05 PM 9.6 (H) 4.8 - 5.6 % Final    Comment:    (NOTE) Pre diabetes:          5.7%-6.4%  Diabetes:              >6.4%  Glycemic control for   <7.0% adults with diabetes    CBG: Recent Labs  Lab 06/15/20 1639 06/15/20 1948 06/15/20 2321 06/16/20 0348 06/16/20 0809  GLUCAP 212* 162* 163* 170* 174*   Independent critical care time 33 minutes  Levy Pupa, MD, PhD 06/16/2020, 10:23 AM [facility] [phone] or if no answer [phone]

## 2020-06-16 NOTE — Progress Notes (Signed)
[facility] ADULT ICU REPLACEMENT PROTOCOL FOR AM LAB REPLACEMENT ONLY  The patient does apply for the [facility] Adult ICU Electrolyte Replacment Protocol based on the criteria listed below:   1. Is GFR >/= 40 ml/min? Yes.    Patient's GFR today is 41 2. Is urine output >/= 0.5 ml/kg/hr for the last 6 hours? Yes.   Patient's UOP is 0.9 ml/kg/hr 3. Is BUN < 60 mg/dL? Yes.    Patient's BUN today is 56 4. Abnormal electrolyte(s): Mg 1.6 5. Ordered repletion with: Mg 2g x1 6. If a panic level lab has been reported, has the CCM MD in charge been notified? No.  Physician:  Dr. Carmin Richmond, PharmD Clinical Pharmacist   06/16/2020 10:02 AM

## 2020-06-16 NOTE — Progress Notes (Signed)
ANTICOAGULATION CONSULT NOTE - Follow Up Consult  Pharmacy Consult for IV heparin Indication: atrial fibrillation  Allergies  Allergen Reactions  . Bee Venom Anaphylaxis    Other reaction(s): Unknown Other reaction(s): Unknown Other reaction(s): Unknown   . Penicillins Hives and Anaphylaxis    unknown   . Sglt2 Inhibitors Other (See Comments)    Necrotizing infection of the perineum  . Codeine Other (See Comments)    Pt states that his mother told him he is allergic, but has taken this medication multiple ties with no problems.    . Other Hives    Patient Measurements: Height: 5\' 10"  (177.8 cm) Weight: (!) 290.3 kg (640 lb) IBW/kg (Calculated) : 73 Heparin Dosing Weight: 149.7 kg  Vital Signs: Temp: 99.2 F (37.3 C) (01/16 1200) Temp Source: Esophageal (01/16 1200) BP: 121/68 (01/16 0746) Pulse Rate: 89 (01/16 1514)  Labs: Recent Labs    06/14/20 0451 06/14/20 1800 06/15/20 0555 06/15/20 0927 06/15/20 1140 06/16/20 0437 06/16/20 0438 06/16/20 1136 06/16/20 1712  HGB 9.3*  --  9.4* 9.9*  --  9.0*  --  9.9*  --   HCT 29.1*  --  29.5* 29.0*  --  28.1*  --  29.0*  --   PLT 195  --  236  --   --  249  --   --   --   HEPARINUNFRC 0.14*   < > 0.37  --  0.40  --  0.38  --  0.51  CREATININE 4.12*  --  2.82*  --   --  1.98*  --   --   --    < > = values in this interval not displayed.    Estimated Creatinine Clearance: 103.2 mL/min (A) (by C-G formula based on SCr of 1.98 mg/dL (H)).   Assessment: 49YOM admitted with septic shock secondary to necrotizing fascitis. During this admission, the patient has been in atrial fibrillation with RVR, despite trial of defibrillation; amiodarone was re-loaded while inpatient. Pt has PMH of atrial fibrillation, was on Xarelto and amiodarone PTA.   The patient's heparin level of 0.5 this afternoon is therapeutic. The patient is requiring two heparin bags to achieve a therapeutic rate - this has been double checked and verified by  pharmacy. Heparin infusion 2150 uts/hr running in each bag.  Hgb 9.0. Plt wnl. Per RN no reported bleeding or issues with infusion.    Goal of Therapy:  Heparin level 0.3-0.7 units/ml Monitor platelets by anticoagulation protocol: Yes   Plan:  - Continue Heparin at 4300 units/hr (this will run as 2 bags at 2150 units/hr each - discussed with RN, verified visually by RPh) - Will continue to monitor for any signs/symptoms of bleeding and CBC daily  - Continue checking levels twice daily while on high rates (0500 and 1700)     2151 Pharm.D. CPP, BCPS Clinical Pharmacist [phone] 06/16/2020 5:44 PM

## 2020-06-16 NOTE — Progress Notes (Addendum)
    5 Days Post-Op  Subjective: Slowly weaning pressors, delerius this AM. Tube feeds held again yesterday due to emesis and KUB showed non-obstructive pattern.   Objective: Vital signs in last 24 hours: Temp:  [98.1 F (36.7 C)-100.4 F (38 C)] 100.1 F (37.8 C) (01/16 0731) Pulse Rate:  [108-129] 114 (01/16 0746) Resp:  [0-35] 17 (01/16 0915) BP: (121-125)/(64-68) 121/68 (01/16 0746) SpO2:  [84 %-100 %] 94 % (01/16 0915) Arterial Line BP: (105-152)/(50-80) 146/68 (01/16 0915) FiO2 (%):  [50 %-60 %] 60 % (01/16 0746) Last BM Date: 06/13/20  Intake/Output from previous day: 01/15 0701 - 01/16 0700 In: 4178.8 [I.V.:4078.8; IV Piggyback:100] Out: 6060 [Urine:6060] Intake/Output this shift: No intake/output data recorded.  PE: General: intubated, sedate HEENT: ETT in place Resp: intubated, on vent Vent Mode: PCV FiO2 (%):  [50 %-60 %] 60 % Set Rate:  [26 bmp] 26 bmp PEEP:  [10 cmH20] 10 cmH20 Plateau Pressure:  [20 cmH20-29 cmH20] 23 cmH20 CV: RRR Abdomen: soft, obese nontender Perirectal/perineal wound: Wound bed is overall pink, clean and dry, with ~6x6 cm area of purulent exudate just cephalad to the rectum. Wound drainage is serosanguinous. There is no surrounding blanching erythema. Rectal tube remains in place- no stool in pouch. Extremities: bilateral toes are dusky in appearance. Palpable pedal pulses bilaterally.   Lab Results:  Recent Labs    06/15/20 0555 06/15/20 0927 06/16/20 0437  WBC 22.1*  --  22.6*  HGB 9.4* 9.9* 9.0*  HCT 29.5* 29.0* 28.1*  PLT 236  --  249   BMET Recent Labs    06/15/20 0555 06/15/20 0927 06/16/20 0437  NA 139 140 140  K 4.2 4.0 4.9  CL 103  --  103  CO2 24  --  27  GLUCOSE 193*  --  185*  BUN 65*  --  56*  CREATININE 2.82*  --  1.98*  CALCIUM 7.8*  --  8.1*   PT/INR No results for input(s): LABPROT, INR in the last 72 hours. CMP     Component Value Date/Time   NA 140 06/16/2020 0437   K 4.9 06/16/2020 0437    CL 103 06/16/2020 0437   CO2 27 06/16/2020 0437   GLUCOSE 185 (H) 06/16/2020 0437   BUN 56 (H) 06/16/2020 0437   CREATININE 1.98 (H) 06/16/2020 0437   CALCIUM 8.1 (L) 06/16/2020 0437   PROT 5.8 (L) 06/15/2020 0555   ALBUMIN 1.9 (L) 06/15/2020 0555   AST 96 (H) 06/15/2020 0555   ALT 532 (H) 06/15/2020 0555   ALKPHOS 77 06/15/2020 0555   BILITOT 1.7 (H) 06/15/2020 0555   GFRNONAA 41 (L) 06/16/2020 0437     Assessment/Plan 50 yo male with septic shock secondary to perianal/perineal necrotizing soft tissue infection, s/p operative debridement x2. - Wound examined at bedside today. No sharp debridement was required. Underlying tissue was viable and well-vascularized.  - Continue saline wet-to-dry dressings, currently being changed daily. Rectal tube in place to minimize contamination of the wound with stool - BMx1 documented yesterday, no stool in rectal pouch today - nausea/emesis with non-obstructive bowel gas pattern on KUB. Likely ileus. Consider NG to suction for ongoing emesis. Cortrak being was placed 1/14.  - Continue antibiotics. Intraoperative cultures growing E.Coli (pansensivitie), Strep infantarius, strep constellatus  - VTE: on heparin gtt for a-fib - Surgery will continue to follow closely   LOS: 6 days    Hosie Spangle, [facility]  06/16/20 10:14 AM

## 2020-06-17 DIAGNOSIS — A419 Sepsis, unspecified organism: Secondary | ICD-10-CM | POA: Diagnosis not present

## 2020-06-17 DIAGNOSIS — J9601 Acute respiratory failure with hypoxia: Secondary | ICD-10-CM | POA: Diagnosis not present

## 2020-06-17 DIAGNOSIS — N493 Fournier gangrene: Secondary | ICD-10-CM | POA: Diagnosis not present

## 2020-06-17 LAB — GLUCOSE, CAPILLARY
Glucose-Capillary: 101 mg/dL — ABNORMAL HIGH (ref 70–99)
Glucose-Capillary: 118 mg/dL — ABNORMAL HIGH (ref 70–99)
Glucose-Capillary: 123 mg/dL — ABNORMAL HIGH (ref 70–99)
Glucose-Capillary: 155 mg/dL — ABNORMAL HIGH (ref 70–99)
Glucose-Capillary: 158 mg/dL — ABNORMAL HIGH (ref 70–99)
Glucose-Capillary: 167 mg/dL — ABNORMAL HIGH (ref 70–99)

## 2020-06-17 LAB — CBC
HCT: 26.7 % — ABNORMAL LOW (ref 39.0–52.0)
Hemoglobin: 8 g/dL — ABNORMAL LOW (ref 13.0–17.0)
MCH: 28.3 pg (ref 26.0–34.0)
MCHC: 30 g/dL (ref 30.0–36.0)
MCV: 94.3 fL (ref 80.0–100.0)
Platelets: 261 10*3/uL (ref 150–400)
RBC: 2.83 MIL/uL — ABNORMAL LOW (ref 4.22–5.81)
RDW: 17.1 % — ABNORMAL HIGH (ref 11.5–15.5)
WBC: 21.4 10*3/uL — ABNORMAL HIGH (ref 4.0–10.5)
nRBC: 6.4 % — ABNORMAL HIGH (ref 0.0–0.2)

## 2020-06-17 LAB — PATHOLOGIST SMEAR REVIEW

## 2020-06-17 LAB — BASIC METABOLIC PANEL
Anion gap: 11 (ref 5–15)
BUN: 49 mg/dL — ABNORMAL HIGH (ref 6–20)
CO2: 26 mmol/L (ref 22–32)
Calcium: 8.2 mg/dL — ABNORMAL LOW (ref 8.9–10.3)
Chloride: 103 mmol/L (ref 98–111)
Creatinine, Ser: 1.44 mg/dL — ABNORMAL HIGH (ref 0.61–1.24)
GFR, Estimated: 60 mL/min — ABNORMAL LOW (ref >60)
Glucose, Bld: 132 mg/dL — ABNORMAL HIGH (ref 70–99)
Potassium: 4.1 mmol/L (ref 3.5–5.1)
Sodium: 140 mmol/L (ref 135–145)

## 2020-06-17 LAB — AEROBIC/ANAEROBIC CULTURE W GRAM STAIN (SURGICAL/DEEP WOUND)

## 2020-06-17 LAB — HEPARIN LEVEL (UNFRACTIONATED)
Heparin Unfractionated: 0.49 IU/mL (ref 0.30–0.70)
Heparin Unfractionated: 0.48 IU/mL (ref 0.30–0.70)

## 2020-06-17 LAB — PHOSPHORUS: Phosphorus: 2.4 mg/dL — ABNORMAL LOW (ref 2.5–4.6)

## 2020-06-17 LAB — TRIGLYCERIDES: Triglycerides: 150 mg/dL — ABNORMAL HIGH (ref <150)

## 2020-06-17 LAB — MAGNESIUM: Magnesium: 1.7 mg/dL (ref 1.7–2.4)

## 2020-06-17 MED ORDER — METRONIDAZOLE IN NACL 5-0.79 MG/ML-% IV SOLN
500.0000 mg | Freq: Three times a day (TID) | INTRAVENOUS | Status: DC
  Administered 2020-06-17 – 2020-06-22 (×16): 500 mg via INTRAVENOUS
  Filled 2020-06-17 (×16): qty 100

## 2020-06-17 MED ORDER — PREGABALIN 100 MG PO CAPS
300.0000 mg | ORAL_CAPSULE | Freq: Two times a day (BID) | ORAL | Status: DC
  Administered 2020-06-17 – 2020-07-12 (×51): 300 mg
  Filled 2020-06-17 (×11): qty 4
  Filled 2020-06-17: qty 3
  Filled 2020-06-17 (×5): qty 4
  Filled 2020-06-17: qty 3
  Filled 2020-06-17 (×7): qty 4
  Filled 2020-06-17: qty 3
  Filled 2020-06-17 (×14): qty 4
  Filled 2020-06-17: qty 3
  Filled 2020-06-17 (×3): qty 4
  Filled 2020-06-17: qty 3
  Filled 2020-06-17 (×6): qty 4

## 2020-06-17 MED ORDER — PREGABALIN 75 MG PO CAPS: 300.0000 mg | ORAL_CAPSULE | Freq: Two times a day (BID) | ORAL | Status: DC

## 2020-06-17 MED ORDER — INSULIN DETEMIR 100 UNIT/ML ~~LOC~~ SOLN
40.0000 [IU] | Freq: Two times a day (BID) | SUBCUTANEOUS | Status: DC
  Administered 2020-06-17 – 2020-06-30 (×28): 40 [IU] via SUBCUTANEOUS
  Filled 2020-06-17 (×29): qty 0.4

## 2020-06-17 NOTE — Progress Notes (Signed)
ANTICOAGULATION CONSULT NOTE - Follow Up Consult  Pharmacy Consult for IV heparin Indication: atrial fibrillation  Allergies  Allergen Reactions  . Bee Venom Anaphylaxis    Other reaction(s): Unknown Other reaction(s): Unknown Other reaction(s): Unknown   . Penicillins Hives and Anaphylaxis    unknown   . Sglt2 Inhibitors Other (See Comments)    Necrotizing infection of the perineum  . Codeine Other (See Comments)    Pt states that his mother told him he is allergic, but has taken this medication multiple ties with no problems.    . Other Hives    Patient Measurements: Height: 5\' 10"  (177.8 cm) Weight: (!) 290.3 kg (640 lb) IBW/kg (Calculated) : 73 Heparin Dosing Weight: 149.7 kg  Vital Signs: Temp: 98.9 F (37.2 C) (01/17 0400) Temp Source: Esophageal (01/17 0400) BP: 113/60 (01/17 0746) Pulse Rate: 106 (01/17 0746)  Labs: Recent Labs    06/15/20 0555 06/15/20 0927 06/16/20 0437 06/16/20 0438 06/16/20 1136 06/16/20 1712 06/17/20 0309  HGB 9.4*   < > 9.0*  --  9.9*  --  8.0*  HCT 29.5*   < > 28.1*  --  29.0*  --  26.7*  PLT 236  --  249  --   --   --  261  HEPARINUNFRC 0.37   < >  --  0.38  --  0.51 0.49  CREATININE 2.82*  --  1.98*  --   --   --  1.44*   < > = values in this interval not displayed.    Estimated Creatinine Clearance: 141.9 mL/min (A) (by C-G formula based on SCr of 1.44 mg/dL (H)).   Assessment: 49YOM admitted with septic shock secondary to necrotizing fascitis. During this admission, the patient has been in atrial fibrillation with RVR, despite trial of defibrillation; amiodarone was re-loaded while inpatient. Pt has PMH of atrial fibrillation, was on Xarelto and amiodarone PTA.   The patient's heparin level of 0.49 remains therapeutic. The patient is requiring two heparin bags to achieve a therapeutic rate - this has been double checked and verified by pharmacy. Heparin infusion 2150 uts/hr running in each bag.  Hgb 8.0 is down one point  from yesterday. Plt wnl in the 200s.No reported bleeding per MD or issues with infusion per nursing or MAR  Goal of Therapy:  Heparin level 0.3-0.7 units/ml Monitor platelets by anticoagulation protocol: Yes   Plan:  - Continue Heparin at 4300 units/hr (this will run as 2 bags at 2150 units/hr each - discussed with RN, verified visually by RPh) - Will continue to monitor for any signs/symptoms of bleeding and CBC daily  - Continue checking levels twice daily while on high rates (0500 and 1700)  2151, PharmD PGY1 Pharmacy Resident 06/17/2020 10:23 AM

## 2020-06-17 NOTE — Progress Notes (Signed)
eLink Physician-Brief Progress Note Patient Name: Carl Hill DOB: [birth year] MRN: [id number]   Date of Service  06/17/2020  HPI/Events of Note  Request for possible need for another pressor On neosynephrine and vaso 0.03 MAP 68  eICU Interventions  Would increase vaso to 0.04 if MAP < 65     Intervention Category Intermediate Interventions: Hypotension - evaluation and management  Darl Pikes 06/17/2020, 9:24 PM

## 2020-06-17 NOTE — Progress Notes (Signed)
6 Days Post-Op  Subjective: CC: Notes reviewed overnight. Still on pressors.   Objective: Vital signs in last 24 hours: Temp:  [98.1 F (36.7 C)-99.2 F (37.3 C)] 98.9 F (37.2 C) (01/17 0400) Pulse Rate:  [89-113] 113 (01/17 1114) Resp:  [0-38] 38 (01/17 1114) BP: (113-137)/(60) 137/60 (01/17 1114) SpO2:  [84 %-100 %] 94 % (01/17 1125) Arterial Line BP: (91-152)/(43-80) 126/69 (01/17 1000) FiO2 (%):  [40 %-100 %] 80 % (01/17 1125) Last BM Date: 06/13/20  Intake/Output from previous day: 01/16 0701 - 01/17 0700 In: 3626.4 [I.V.:3176.3; NG/GT:300; IV Piggyback:150.1] Out: 2515 [Urine:2515] Intake/Output this shift: Total I/O In: 303.7 [I.V.:113.7; NG/GT:190] Out: 560 [Urine:560]  PE: Gen: Intubated and sedated Heart: tachycardic  Lungs: On vent  Abdomen: soft, obese nontender Perirectal/perineal wound: Wound bed is overall pink/beefy red, clean and dry, as noted in the picture below. Wound drainage is serosanguinous. There is no surrounding blanching erythema. Rectal tube remains in place with some liquid stool.      Lab Results:  Recent Labs    06/16/20 0437 06/16/20 1136 06/17/20 0309  WBC 22.6*  --  21.4*  HGB 9.0* 9.9* 8.0*  HCT 28.1* 29.0* 26.7*  PLT 249  --  261   BMET Recent Labs    06/16/20 0437 06/16/20 1136 06/17/20 0309  NA 140 142 140  K 4.9 5.0 4.1  CL 103  --  103  CO2 27  --  26  GLUCOSE 185*  --  132*  BUN 56*  --  49*  CREATININE 1.98*  --  1.44*  CALCIUM 8.1*  --  8.2*   PT/INR No results for input(s): LABPROT, INR in the last 72 hours. CMP     Component Value Date/Time   NA 140 06/17/2020 0309   K 4.1 06/17/2020 0309   CL 103 06/17/2020 0309   CO2 26 06/17/2020 0309   GLUCOSE 132 (H) 06/17/2020 0309   BUN 49 (H) 06/17/2020 0309   CREATININE 1.44 (H) 06/17/2020 0309   CALCIUM 8.2 (L) 06/17/2020 0309   PROT 5.8 (L) 06/15/2020 0555   ALBUMIN 1.9 (L) 06/15/2020 0555   AST 96 (H) 06/15/2020 0555   ALT 532 (H)  06/15/2020 0555   ALKPHOS 77 06/15/2020 0555   BILITOT 1.7 (H) 06/15/2020 0555   GFRNONAA 60 (L) 06/17/2020 0309   Lipase  No results found for: LIPASE     Studies/Results: No results found.  Anti-infectives: Anti-infectives (From admission, onward)   Start     Dose/Rate Route Frequency Ordered Stop   06/17/20 1130  metroNIDAZOLE (FLAGYL) IVPB 500 mg        500 mg 100 mL/hr over 60 Minutes Intravenous Every 8 hours 06/17/20 1041     06/13/20 1400  cefTRIAXone (ROCEPHIN) 2 g in sodium chloride 0.9 % 100 mL IVPB        2 g 200 mL/hr over 30 Minutes Intravenous Every 24 hours 06/13/20 1242     06/11/20 1300  vancomycin (VANCOREADY) IVPB 2000 mg/400 mL        2,000 mg 200 mL/hr over 120 Minutes Intravenous  Once 06/11/20 1212 06/11/20 1824   06/10/20 2000  clindamycin (CLEOCIN) IVPB 900 mg        900 mg 100 mL/hr over 30 Minutes Intravenous Every 8 hours 06/10/20 1307 06/13/20 1500   06/10/20 1600  ceFEPIme (MAXIPIME) 2 g in sodium chloride 0.9 % 100 mL IVPB  Status:  Discontinued  2 g 200 mL/hr over 30 Minutes Intravenous Every 12 hours 06/10/20 1504 06/13/20 1242   06/10/20 1400  metroNIDAZOLE (FLAGYL) IVPB 500 mg  Status:  Discontinued        500 mg 100 mL/hr over 60 Minutes Intravenous Every 8 hours 06/10/20 1023 06/10/20 1308   06/10/20 1300  aztreonam (AZACTAM) 2 g in sodium chloride 0.9 % 100 mL IVPB  Status:  Discontinued        2 g 200 mL/hr over 30 Minutes Intravenous Every 8 hours 06/10/20 1033 06/10/20 1504   06/10/20 1215  clindamycin (CLEOCIN) IVPB 900 mg        900 mg 100 mL/hr over 30 Minutes Intravenous To Surgery 06/10/20 1210 06/10/20 1210   06/10/20 1033  vancomycin variable dose per unstable renal function (pharmacist dosing)  Status:  Discontinued         Does not apply See admin instructions 06/10/20 1033 06/12/20 1252   06/10/20 0600  metroNIDAZOLE (FLAGYL) tablet 500 mg  Status:  Discontinued        500 mg Oral Every 8 hours 06/10/20 0406  06/10/20 0948   06/10/20 0415  aztreonam (AZACTAM) 2 g in sodium chloride 0.9 % 100 mL IVPB        2 g 200 mL/hr over 30 Minutes Intravenous  Once 06/10/20 0406 06/10/20 0546   06/10/20 0415  vancomycin (VANCOCIN) IVPB 1000 mg/200 mL premix  Status:  Discontinued        1,000 mg 200 mL/hr over 60 Minutes Intravenous  Once 06/10/20 0406 06/10/20 0412   06/10/20 0415  vancomycin (VANCOCIN) 2,500 mg in sodium chloride 0.9 % 500 mL IVPB        2,500 mg 250 mL/hr over 120 Minutes Intravenous  Once 06/10/20 0412 06/10/20 0903       Assessment/Plan A. Fib VT Arrest (1/10) VDRF DM2 Hypothyroidism Hx HTN  50 yo male with septic shock secondary to perianal/perineal necrotizing soft tissue infection - s/p operative debridement x2 (1/10 and 1/11) - Wound examined at bedside today. No sharp debridement was required. Underlying tissue was viable and well-vascularized.  - Continue saline wet-to-dry dressings, currently being changed daily. Rectal tube in place to minimize contamination of the wound with stool   - Continue antibiotics. Intraoperative cultures growing E.Coli (pansensivitie), Strep infantarius - narrowed to Rocephin/Flagyl  - Appreciate CCM's assistance in management of patient shock and MMP - Surgery will continue to follow closely - VTE: on heparin gtt for a-fib   LOS: 7 days    Jacinto Halim , [facility] 06/17/2020, 11:32 AM Please see Amion for pager number during day hours 7:00am-4:30pm

## 2020-06-17 NOTE — Progress Notes (Addendum)
NAME:  Carl Hill, MRN:  [id number], DOB:  1971-05-04, LOS: 7 ADMISSION DATE:  06/10/2020, CONSULTATION DATE:  06/10/2020 REFERRING MD:  Ophelia Charter, CHIEF COMPLAINT:  Severe Sepsis    Brief History:  50 year old white male admitted with necrotizing fasciitis of the left buttock, left upper thigh, and perineum, with associated severe sepsis/septic shock. Critical care consulted  History of Present Illness:  This is a 50 year old chronically ill-appearing obese white male currently lying in bed, lethargic but not in acute distress.   Presented to the emergency room accompanied by his wife the a.m. of 1/10 for evaluation of hypoglycemia, Malaise and fatigue.  Patient had been in his usual state of health until approximately 1 week prior to presentation at which time his wife started to notice he had 2 boils on his left buttocks near the left gluteal fold.  She initially dressed these, after cleaning them.  The patient had progressive pain in the left buttocks, decreased activity, and then wife started noting more hyperglycemia with blood glucose as high as the 400s and because of this he presented to the emergency room.  On arrival to the emergency room he was found to be febrile, his blood glucose was in the 300s, white blood cell count over 17,000, he had a mild lactic acidosis of 3.6, he was tachycardic and hypotensive.   Cultures were sent, CT imaging obtained of the pelvis showing extensive subcutaneous emphysema in the medial aspect of the upper thigh extending throughout the perineum and left medial buttocks with gas extending into the pelvis surrounding the anus.  General surgery was consulted emergently broad-spectrum antibiotic started IV fluid administered critical care consulted and concern for progressive clinical decline  Past Medical History:  HTN, obesity, DM, afib on DOAC  Significant Hospital Events:  06/10/20: Admitted to [facility]. General surgery consulted and debridement pursued. In  the afternoon, patient developed hypotension with AF in RVR. Synchronized cardioversion unsuccessful with conversion to VT. CPR initated with ROSC after desynchronized defibrillation.  Consults:  General surgery, PCCM  Procedures:  06/10/2020: Excisional debridement  06/11/2020: Excisional debridement   Significant Diagnostic Tests:   CT abdomen/pelvis (06/10/2020) >>  Extensive subcutaneous emphysema in the medial aspect of the upper thighs extending through perineum and superiorly to the medial left buttock. Gas extends into the pelvis surrounding the anus inferiorly and tracking superiorly along the left wall of the rectum. No definite visualized fluid collections. 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  Micro Data:  Blood cultures 1/10 >> negative  Right Gluteal deep wound culture 1/10 >> Rare E. Coli, Rare Strep agalactiae (group B), Rare strep Infantarius,  Rare Strep Constellatus.  Abundant Peptostreptococcus species (anaerobe)  Left Gluteal deep wound culture 1/10 >> S agalactiae, abundant Peptostreptococcus, few actinomyces  Antimicrobials:  Aztreonam 1/09 Metronidazole 1/09 Vancomycin 1/09, 1/11 Cefepime 1/10 - 1/13  Clindamycin 1/10 -1/13 Ceftriaxone 1/13 >> Metronidazole 1/17 >>   Interim History / Subjective:   Remains on phenylephrine 250, vasopressin 0.03 Fentanyl 75 Propofol added on 1/16, currently 30 Serum creatinine 1.98 > 1.44 Hemoglobin 9 > 8 I/O+ 11.7 L Hypophosphatemia, hypomagnesemia Pressure control 28, PEEP 10, FiO2 0.50  Objective   Blood pressure 113/60, pulse (!) 106, temperature 98.9 F (37.2 C), temperature source Esophageal, resp. rate (!) 23, height 5\' 10"  (1.778 m), weight (!) 290.3 kg, SpO2 95 %.    Vent Mode: PCV FiO2 (%):  [40 %-80 %] 40 % Set Rate:  [26 bmp] 26 bmp  PEEP:  [10 cmH20] 10 cmH20 Plateau Pressure:  [23 cmH20-27 cmH20] 24 cmH20   Intake/Output Summary  (Last 24 hours) at 06/17/2020 1021 Last data filed at 06/17/2020 1610 Gross per 24 hour  Intake 3116.92 ml  Output 2915 ml  Net 201.92 ml   Filed Weights   06/11/20 0445 06/13/20 0446 06/14/20 0500  Weight: (!) 303.5 kg (!) 296.7 kg (!) 290.3 kg   Examination: GEN: Obese man, intubated, ill-appearing, uncomfortable HEENT ET tube in good position, oropharynx clear, pupils small Neck: Very large neck, unable to assess JVP Lungs distant, no wheeze, on pressure control ventilation CV: Tachycardic, distant, no murmur Abdomen morbidly obese, nondistended, hypoactive bowel sounds Extremities: Trace pretibial edema, cool Neuro: Turns head to voice, appears to track but will not follow commands, has some spontaneous movement of bilateral upper extremities but unclear whether this is purposeful   Assessment & Plan:   # Septic Shock: Pressor support still high but overall improving.  # Necrotizing Fascitis of the Left Buttocks s/p Debridement: No current plans to go back to OR. -Continue to wean phenylephrine. Remains on vasopressin -Continue ceftriaxone as ordered.  Duration unclear, will depend on progress with his wound -Note Peptostreptococcus in right and left gluteal samples, actinomyces left gluteal sample.  Ceftriaxone will cover the actinomyces, but plan to restart metronidazole on 1/17 to cover the Peptostreptococcus -Appreciate surgery management.  Did not require sharp debridement on 1/16, plan to continue wet-to-dry dressing changes  # A.fib with RVR: Previously on Amiodarone and Xarelto at home. RVR in the setting of acute infection. Synchronized cardioversion unsuccessful. Heart rate is stable overnight and into this morning in the low 100s, occasionally improving to the 80-90s.  # VT Arrest (06/10/20): Complication of synchronized cardioversion. ROSC obtained within seconds to minutes.  -On heparin infusion, amiodarone infusion.  Question whether we may be able to transition amio  to per tube soon.   -Off norepinephrine, using phenylephrine and vasopressin as above -Follow telemetry  # Acute Hypoxic and Hypercapnic Respiratory Failure: In the setting of metabolic encephalopathy and cardiac arrest.  Evolving volume overload likely a contributor to his respiratory failure as well -Continue pressure control ventilation.  Will attempt transition to spontaneous breathing trials once we are comfortable that we can lighten his sedation -Would benefit from diuresis but have not yet pursued due to his renal injury -Question whether he may ultimately require tracheostomy to facilitate vent weaning given his dressing changes and sedation needs, obesity  #Emesis, KUB 1/15 without any overt evidence of obstruction, likely ileus -Slowly reinitiate and advanced TF -Continue bowel regimen as ordered: Colace, senna, MiraLAX, Dulcolax -Rectal tube in place to avoid stool contamination of his wound once he begins to have more BM  # Hyperglycemia:  # Type 2 Diabetes Mellitus  -Plan to decrease Levemir to 45 units on 1/17 since tube feeding not at goal -Continue NovoLog 15 units every 4 hours, hold for CBG less than 140 -Continue sliding scale insulin resistant scale  # Acute Kidney Injury: Used to improve 1/16 # Hyponatremia: Normalized -Follow BMP, urine output with restoration of adequate perfusion -Avoid nephrotoxins, renal dose medications -Ultimately will need diuresis but holding off for now given his improving renal failure and pressor needs.  # Acute Liver Injury: Likely shock liver.  Continues to improve -Follow LFT intermittently  #Hypothyroidism -Continue Synthroid  # Metabolic Acidosis: Resolved  # Acute Toxic Metabolic Encephalopathy:  -Significant discomfort especially with dressing changes.  Propofol added to his fentanyl on 1/16.  Hopefully if dressing changes and wound care become simpler and less painful we will be able to wean.  #chronic pain,  neuropathy -nortriptyline -add back lyrica 1/17   Best practice (evaluated daily)  Diet: Tube Feeds uptitrating slowly due to ileus Pain/Anxiety/Delirium protocol (if indicated): Fentanyl, added propofol 1/16 VAP protocol (if indicated): Ordered DVT prophylaxis: Heparin gtt  GI prophylaxis: Protonix  Glucose control: [place] insulin  Bowel regimen: Docusate BID, Senna BID.  Mobility: Bedrest Disposition: ICU Family: discussed his status, active issues and care plan with his mom by phone on 1/17   Labs   CBC: Recent Labs  Lab 06/13/20 0410 06/13/20 1221 06/14/20 0451 06/15/20 0555 06/15/20 0927 06/16/20 0437 06/16/20 1136 06/17/20 0309  WBC 15.3*  --  17.4* 22.1*  --  22.6*  --  21.4*  HGB 9.7*   < > 9.3* 9.4* 9.9* 9.0* 9.9* 8.0*  HCT 30.2*   < > 29.1* 29.5* 29.0* 28.1* 29.0* 26.7*  MCV 88.0  --  89.3 89.9  --  92.4  --  94.3  PLT 208  --  195 236  --  249  --  261   < > = values in this interval not displayed.   Basic Metabolic Panel: Recent Labs  Lab 06/12/20 1821 06/12/20 2138 06/13/20 0410 06/13/20 1221 06/13/20 1700 06/14/20 0451 06/15/20 0555 06/15/20 0927 06/16/20 0437 06/16/20 1136 06/17/20 0309  NA  --  134* 134*   < >  --  134* 139 140 140 142 140  K  --  4.2 4.2   < >  --  4.4 4.2 4.0 4.9 5.0 4.1  CL  --  98 98  --   --  100 103  --  103  --  103  CO2  --  20* 20*  --   --  20* 24  --  27  --  26  GLUCOSE  --  149* 183*  --   --  272* 193*  --  185*  --  132*  BUN  --  61* 66*  --   --  75* 65*  --  56*  --  49*  CREATININE  --  4.89* 4.94*  --   --  4.12* 2.82*  --  1.98*  --  1.44*  CALCIUM  --  7.2* 7.2*  --   --  7.3* 7.8*  --  8.1*  --  8.2*  MG 1.6* 1.7 2.0  --  1.9  --   --   --  1.6*  --  1.7  PHOS 5.5*  --  5.8*  --  6.4*  --   --   --  3.9  --  2.4*   < > = values in this interval not displayed.   GFR: Estimated Creatinine Clearance: 141.9 mL/min (A) (by C-G formula based on SCr of 1.44 mg/dL (H)). Recent Labs  Lab 06/10/20 1550  06/10/20 1600 06/11/20 0819 06/11/20 0836 06/12/20 0405 06/14/20 0451 06/15/20 0555 06/16/20 0437 06/17/20 0309  PROCALCITON 3.10  --   --   --   --   --   --   --   --   WBC  --   --    < >  --    < > 17.4* 22.1* 22.6* 21.4*  LATICACIDVEN  --  2.2*  --  1.4  --   --   --   --   --    < > = values in  this interval not displayed.   Liver Function Tests: Recent Labs  Lab 06/12/20 0405 06/13/20 0410 06/14/20 0451 06/15/20 0555  AST 3,261* 1,204* 274* 96*  ALT 1,943* 1,514* 889* 532*  ALKPHOS 80 78 83 77  BILITOT 2.3* 1.8* 1.8* 1.7*  PROT 5.2* 5.4* 5.6* 5.8*  ALBUMIN 1.8* 1.9* 1.9* 1.9*   No results for input(s): LIPASE, AMYLASE in the last 168 hours. No results for input(s): AMMONIA in the last 168 hours.  ABG    Component Value Date/Time   PHART 7.255 (L) 06/16/2020 1136   PCO2ART 69.4 (HH) 06/16/2020 1136   PO2ART 73 (L) 06/16/2020 1136   HCO3 30.6 (H) 06/16/2020 1136   TCO2 33 (H) 06/16/2020 1136   ACIDBASEDEF 2.0 06/13/2020 1221   O2SAT 90.0 06/16/2020 1136   Coagulation Profile: Recent Labs  Lab 06/12/20 1144  INR 1.3*    Cardiac Enzymes: No results for input(s): CKTOTAL, CKMB, CKMBINDEX, TROPONINI in the last 168 hours.  HbA1C: Hgb A1c MFr Bld  Date/Time Value Ref Range Status  06/10/2020 04:05 PM 9.6 (H) 4.8 - 5.6 % Final    Comment:    (NOTE) Pre diabetes:          5.7%-6.4%  Diabetes:              >6.4%  Glycemic control for   <7.0% adults with diabetes    CBG: Recent Labs  Lab 06/16/20 1603 06/16/20 1928 06/16/20 2333 06/17/20 0318 06/17/20 0734  GLUCAP 159* 133* 112* 101* 118*   Independent critical care time 33 minutes  Levy Pupa, MD, PhD 06/17/2020, 10:21 AM [facility] [phone] or if no answer [phone]

## 2020-06-17 NOTE — Progress Notes (Addendum)
ANTICOAGULATION CONSULT NOTE - Follow Up Consult  Pharmacy Consult for IV heparin Indication: atrial fibrillation  Allergies  Allergen Reactions  . Bee Venom Anaphylaxis    Other reaction(s): Unknown Other reaction(s): Unknown Other reaction(s): Unknown   . Penicillins Hives and Anaphylaxis    unknown   . Sglt2 Inhibitors Other (See Comments)    Necrotizing infection of the perineum  . Codeine Other (See Comments)    Pt states that his mother told him he is allergic, but has taken this medication multiple ties with no problems.    . Other Hives    Patient Measurements: Height: 5\' 10"  (177.8 cm) Weight: (!) 290.3 kg (640 lb) IBW/kg (Calculated) : 73 Heparin Dosing Weight: 149.7 kg  Vital Signs: Temp: 99.8 F (37.7 C) (01/17 1600) Temp Source: Esophageal (01/17 1600) BP: 137/60 (01/17 1114) Pulse Rate: 123 (01/17 1534)  Labs: Recent Labs    06/15/20 0555 06/15/20 0927 06/16/20 0437 06/16/20 0438 06/16/20 1136 06/16/20 1712 06/17/20 0309 06/17/20 1756  HGB 9.4*   < > 9.0*  --  9.9*  --  8.0*  --   HCT 29.5*   < > 28.1*  --  29.0*  --  26.7*  --   PLT 236  --  249  --   --   --  261  --   HEPARINUNFRC 0.37   < >  --    < >  --  0.51 0.49 0.48  CREATININE 2.82*  --  1.98*  --   --   --  1.44*  --    < > = values in this interval not displayed.    Estimated Creatinine Clearance: 141.9 mL/min (A) (by C-G formula based on SCr of 1.44 mg/dL (H)).   Assessment: 49YOM admitted with septic shock secondary to necrotizing fascitis. During this admission, the patient has been in atrial fibrillation with RVR, despite trial of defibrillation; amiodarone was re-loaded while inpatient. Pt has PMH of atrial fibrillation, was on Xarelto and amiodarone PTA.   The patient's heparin level of 0.48 remains therapeutic. The patient is requiring two heparin bags to achieve a therapeutic rate - this has been double checked and verified by pharmacy. Heparin infusion 2150 uts/hr running  in each bag. Per RN no bleeding noted or issues with IV infusion.    Goal of Therapy:  Heparin level 0.3-0.7 units/ml Monitor platelets by anticoagulation protocol: Yes   Plan:  - Continue Heparin at 4300 units/hr (this will run as 2 bags at 2150 units/hr each - discussed with RN) - Will continue to monitor for any signs/symptoms of bleeding and CBC daily  - Continue checking levels twice daily while on high rates (0500 and 1700)  2151, PharmD Clinical Pharmacist  06/17/2020 6:44 PM

## 2020-06-17 NOTE — Progress Notes (Signed)
Nutrition Follow-up   DOCUMENTATION CODES:   Morbid obesity  INTERVENTION:   Recommend having diagnostic radiology advance Cortrak tube to postpyloric position to improve feeding tolerance and facilitate appropriate nutrition provision to support healing.  Continue TF via Cortrak: Pivot 1.5 at 30 ml/h, recommend increase by 10 ml every 4-8 hours to goal rate of 70 ml/h (1680 ml per day) Prosource TF 90 ml QID  Provides 2840 kcal, 246 gm protein, 1260 ml free water daily.  If unable to reach TF goal rate, consider TPN.  NUTRITION DIAGNOSIS:   Increased nutrient needs related to wound healing as evidenced by estimated needs.  Ongoing  GOAL:   Patient will meet greater than or equal to 90% of their needs   Progressing  MONITOR:   Vent status,Labs,TF tolerance,Skin  REASON FOR ASSESSMENT:   Consult Enteral/tube feeding initiation and management  ASSESSMENT:   50 yo male admitted with hyperglycemia, severe septic shock r/t Fournier's gangrene, AKI. PMH includes morbid obesity, HTN, DM, chronic LE wounds (L heel, L calf) A fib, laparoscopic gastric sleeve resection.   Cortrak was unable to be advanced past the pylorus on 1/14.  Radiology consulted for postpyloric tube advancement. No indication that this was able to be completed.  KUB on 1/15 showed possible ileus, tip of Cortrak is gastric. TF was held on 1/14, off all day 1/15, resumed late 1/16. Currently, Pivot 1.5 is infusing via Cortrak tube at 30 ml/h. Remains on phenylephrine and vasopressin.  Rectal tube in place to prevent stool contamination of wound. No stool documented in rectal pouch yet.   Patient remains intubated on ventilator support. MV: 14.8 L/min Temp (24hrs), Avg:98.7 F (37.1 C), Min:98.1 F (36.7 C), Max:99.2 F (37.3 C) MAP (a-line) >/= 67 this morning  Propofol at 46.7 ml/h providing 1233 kcal from lipid.  Labs reviewed. BUN 49, creatinine 1.44, phos 2.4, trig 150 CBG:  101-118  Medications reviewed and include colace, Novolog, Levemir, Miralax, Senokot, phenylephrine, propofol, vasopressin.  No new weight since 1/14.  Diet Order:   Diet Order            Diet NPO time specified  Diet effective now                 EDUCATION NEEDS:   Not appropriate for education at this time  Skin:  Skin Assessment: Skin Integrity Issues: Skin Integrity Issues:: Stage II,Other (Comment) Stage II: R heel Other: necrotizing infection to pelvis, anus, rectum  Last BM:  1/15  Height:   Ht Readings from Last 1 Encounters:  06/14/20 5\' 10"  (1.778 m)    Weight:   Wt Readings from Last 1 Encounters:  06/14/20 (!) 290.3 kg    Ideal Body Weight:  94.5 kg  BMI:  Body mass index is 91.83 kg/m.  Estimated Nutritional Needs:   Kcal:  2500-2800  Protein:  225-240 gm  Fluid:  >/= 2.5 L    06/16/20, RD, LDN, CNSC Please refer to Amion for contact information.

## 2020-06-18 ENCOUNTER — Inpatient Hospital Stay (HOSPITAL_COMMUNITY): Payer: Medicaid Other

## 2020-06-18 DIAGNOSIS — M726 Necrotizing fasciitis: Secondary | ICD-10-CM | POA: Diagnosis not present

## 2020-06-18 DIAGNOSIS — A419 Sepsis, unspecified organism: Secondary | ICD-10-CM | POA: Diagnosis not present

## 2020-06-18 DIAGNOSIS — J9601 Acute respiratory failure with hypoxia: Secondary | ICD-10-CM | POA: Diagnosis not present

## 2020-06-18 DIAGNOSIS — R6521 Severe sepsis with septic shock: Secondary | ICD-10-CM | POA: Diagnosis not present

## 2020-06-18 LAB — BASIC METABOLIC PANEL
Anion gap: 11 (ref 5–15)
BUN: 48 mg/dL — ABNORMAL HIGH (ref 6–20)
CO2: 26 mmol/L (ref 22–32)
Calcium: 8 mg/dL — ABNORMAL LOW (ref 8.9–10.3)
Chloride: 106 mmol/L (ref 98–111)
Creatinine, Ser: 1.38 mg/dL — ABNORMAL HIGH (ref 0.61–1.24)
GFR, Estimated: >60 mL/min (ref >60)
Glucose, Bld: 175 mg/dL — ABNORMAL HIGH (ref 70–99)
Potassium: 4.4 mmol/L (ref 3.5–5.1)
Sodium: 143 mmol/L (ref 135–145)

## 2020-06-18 LAB — CBC
HCT: 27.4 % — ABNORMAL LOW (ref 39.0–52.0)
Hemoglobin: 8.2 g/dL — ABNORMAL LOW (ref 13.0–17.0)
MCH: 28.9 pg (ref 26.0–34.0)
MCHC: 29.9 g/dL — ABNORMAL LOW (ref 30.0–36.0)
MCV: 96.5 fL (ref 80.0–100.0)
Platelets: 288 10*3/uL (ref 150–400)
RBC: 2.84 MIL/uL — ABNORMAL LOW (ref 4.22–5.81)
RDW: 18.1 % — ABNORMAL HIGH (ref 11.5–15.5)
WBC: 21.3 10*3/uL — ABNORMAL HIGH (ref 4.0–10.5)
nRBC: 9.1 % — ABNORMAL HIGH (ref 0.0–0.2)

## 2020-06-18 LAB — POCT I-STAT 7, (LYTES, BLD GAS, ICA,H+H)
Acid-Base Excess: 3 mmol/L — ABNORMAL HIGH (ref 0.0–2.0)
Bicarbonate: 29.9 mmol/L — ABNORMAL HIGH (ref 20.0–28.0)
Calcium, Ion: 1.18 mmol/L (ref 1.15–1.40)
HCT: 26 % — ABNORMAL LOW (ref 39.0–52.0)
Hemoglobin: 8.8 g/dL — ABNORMAL LOW (ref 13.0–17.0)
O2 Saturation: 89 %
Patient temperature: 99
Potassium: 4.3 mmol/L (ref 3.5–5.1)
Sodium: 143 mmol/L (ref 135–145)
TCO2: 32 mmol/L (ref 22–32)
pCO2 arterial: 62.6 mmHg — ABNORMAL HIGH (ref 32.0–48.0)
pH, Arterial: 7.289 — ABNORMAL LOW (ref 7.350–7.450)
pO2, Arterial: 65 mmHg — ABNORMAL LOW (ref 83.0–108.0)

## 2020-06-18 LAB — HEPARIN LEVEL (UNFRACTIONATED)
Heparin Unfractionated: 0.61 IU/mL (ref 0.30–0.70)
Heparin Unfractionated: 0.48 IU/mL (ref 0.30–0.70)

## 2020-06-18 LAB — GLUCOSE, CAPILLARY
Glucose-Capillary: 142 mg/dL — ABNORMAL HIGH (ref 70–99)
Glucose-Capillary: 214 mg/dL — ABNORMAL HIGH (ref 70–99)
Glucose-Capillary: 279 mg/dL — ABNORMAL HIGH (ref 70–99)
Glucose-Capillary: 252 mg/dL — ABNORMAL HIGH (ref 70–99)
Glucose-Capillary: 197 mg/dL — ABNORMAL HIGH (ref 70–99)
Glucose-Capillary: 162 mg/dL — ABNORMAL HIGH (ref 70–99)

## 2020-06-18 LAB — MAGNESIUM: Magnesium: 1.6 mg/dL — ABNORMAL LOW (ref 1.7–2.4)

## 2020-06-18 LAB — TRIGLYCERIDES: Triglycerides: 162 mg/dL — ABNORMAL HIGH (ref <150)

## 2020-06-18 MED ORDER — NOREPINEPHRINE 4 MG/250ML-% IV SOLN
0.0000 ug/min | INTRAVENOUS | Status: DC
  Administered 2020-06-18: 2 ug/min via INTRAVENOUS
  Filled 2020-06-18: qty 250

## 2020-06-18 MED ORDER — ACETYLCYSTEINE 20 % IN SOLN
4.0000 mL | Freq: Two times a day (BID) | RESPIRATORY_TRACT | Status: DC
  Administered 2020-06-19 – 2020-06-20 (×3): 4 mL via RESPIRATORY_TRACT
  Filled 2020-06-18 (×8): qty 4

## 2020-06-18 MED ORDER — ALBUMIN HUMAN 5 % IV SOLN
12.5000 g | Freq: Four times a day (QID) | INTRAVENOUS | Status: DC
  Administered 2020-06-18 (×2): 12.5 g via INTRAVENOUS
  Filled 2020-06-18 (×2): qty 250

## 2020-06-18 MED ORDER — STERILE WATER FOR INJECTION IV SOLN
INTRAVENOUS | Status: DC
  Filled 2020-06-18: qty 850

## 2020-06-18 MED ORDER — MAGNESIUM SULFATE 4 GM/100ML IV SOLN
4.0000 g | Freq: Once | INTRAVENOUS | Status: AC
  Administered 2020-06-18: 4 g via INTRAVENOUS
  Filled 2020-06-18: qty 100

## 2020-06-18 MED ORDER — ALBUTEROL SULFATE (2.5 MG/3ML) 0.083% IN NEBU
2.5000 mg | INHALATION_SOLUTION | Freq: Two times a day (BID) | RESPIRATORY_TRACT | Status: DC
  Administered 2020-06-18 – 2020-06-20 (×4): 2.5 mg via RESPIRATORY_TRACT
  Filled 2020-06-18 (×5): qty 3

## 2020-06-18 MED ORDER — AMIODARONE HCL 200 MG PO TABS
200.0000 mg | ORAL_TABLET | Freq: Every day | ORAL | Status: DC
  Administered 2020-06-18 – 2020-07-12 (×25): 200 mg
  Filled 2020-06-18 (×25): qty 1

## 2020-06-18 MED ORDER — HYDROCORTISONE NA SUCCINATE PF 100 MG IJ SOLR
50.0000 mg | Freq: Four times a day (QID) | INTRAMUSCULAR | Status: DC
  Administered 2020-06-18 (×2): 50 mg via INTRAVENOUS
  Filled 2020-06-18 (×2): qty 2

## 2020-06-18 MED ORDER — PANTOPRAZOLE SODIUM 40 MG PO PACK
40.0000 mg | PACK | ORAL | Status: DC
  Administered 2020-06-18 – 2020-07-11 (×24): 40 mg
  Filled 2020-06-18 (×25): qty 20

## 2020-06-18 NOTE — Progress Notes (Signed)
Progress Note  7 Days Post-Op  Subjective: Seen for dressing change. Intubated and sedated.   Objective: Vital signs in last 24 hours: Temp:  [98.1 F (36.7 C)-99.9 F (37.7 C)] 98.2 F (36.8 C) (01/18 1210) Pulse Rate:  [88-123] 88 (01/18 1210) Resp:  [8-36] 26 (01/18 1210) BP: (119-126)/(57-61) 119/57 (01/18 1210) SpO2:  [87 %-100 %] 95 % (01/18 1210) Arterial Line BP: (90-136)/(39-64) 136/64 (01/18 0630) FiO2 (%):  [80 %-100 %] 100 % (01/18 1210) Weight:  [287.1 kg] 287.1 kg (01/18 0452) Last BM Date: 06/13/20  Intake/Output from previous day: 01/17 0701 - 01/18 0700 In: 4738.3 [I.V.:2838.6; NG/GT:1180; IV Piggyback:719.7] Out: 3555 [Urine:3555] Intake/Output this shift: Total I/O In: 200 [NG/GT:200] Out: -   PE: Gen: Intubated and sedated Heart: tachycardic  Lungs: On vent  Abdomen: soft, obese nontender Perirectal/perineal wound: Wound bed with some fibrinous exudate and some beefy granulation tissue, clean and dry, as noted in the picture below. Wound drainage is serosanguinous. There is no surrounding blanching erythema. Rectal tube remains in place with some liquid stool.      Lab Results:  Recent Labs    06/17/20 0309 06/18/20 0205 06/18/20 0228  WBC 21.4*  --  21.3*  HGB 8.0* 8.8* 8.2*  HCT 26.7* 26.0* 27.4*  PLT 261  --  288   BMET Recent Labs    06/17/20 0309 06/18/20 0205 06/18/20 0228  NA 140 143 143  K 4.1 4.3 4.4  CL 103  --  106  CO2 26  --  26  GLUCOSE 132*  --  175*  BUN 49*  --  48*  CREATININE 1.44*  --  1.38*  CALCIUM 8.2*  --  8.0*   PT/INR No results for input(s): LABPROT, INR in the last 72 hours. CMP     Component Value Date/Time   NA 143 06/18/2020 0228   K 4.4 06/18/2020 0228   CL 106 06/18/2020 0228   CO2 26 06/18/2020 0228   GLUCOSE 175 (H) 06/18/2020 0228   BUN 48 (H) 06/18/2020 0228   CREATININE 1.38 (H) 06/18/2020 0228   CALCIUM 8.0 (L) 06/18/2020 0228   PROT 5.8 (L) 06/15/2020 0555   ALBUMIN 1.9 (L)  06/15/2020 0555   AST 96 (H) 06/15/2020 0555   ALT 532 (H) 06/15/2020 0555   ALKPHOS 77 06/15/2020 0555   BILITOT 1.7 (H) 06/15/2020 0555   GFRNONAA >60 06/18/2020 0228   Lipase  No results found for: LIPASE     Studies/Results: DG Chest Port 1 View  Result Date: 06/18/2020 CLINICAL DATA:  Oxygen desaturation EXAM: PORTABLE CHEST 1 VIEW COMPARISON:  Radiograph 06/10/2020 FINDINGS: Endotracheal tube tip terminates near the carina/orifice of the left mainstem bronchus. Recommend retraction approximately 3-4 cm to position in the mid trachea. Transesophageal tube courses below the margins of imaging. Right IJ catheter sheath remains in the mid SVC. Near complete opacification of the right hemithorax with minimal residual aerated towards the right apex. Likely reflective of atelectatic collapse though increasing pleural effusion is also possible given some increasing pleural thickening towards the apex. No clear pneumothorax is seen. Left lung demonstrates some persistent atelectatic changes and likely edematous features with vascular congestion and hazy interstitial change. No visible left effusion. Portions of the right heart border largely obscured by overlying opacity. Left cardiomediastinal borders are similar to prior counting for differences in technique. No acute osseous or soft tissue abnormality. IMPRESSION: 1. Endotracheal tube tip terminates near the carina/orifice of the left mainstem bronchus. Recommend  retraction approximately 3-4 cm to position in the mid trachea. 2. Near complete opacification of the right hemithorax with minimal residual aerated towards the right apex. Suggestive of some atelectatic collapse given 2 positioning though increasing pleural effusion is also possible given some increasing pleural thickening towards the apex. 3. Hazy opacities in the left lung may reflect a combination of atelectasis and edema. These results will be called to the ordering clinician or  representative by the Radiologist Assistant, and communication documented in the PACS or [employer]. Electronically Signed   By: Kreg Shropshire M.D.   On: 06/18/2020 02:29    Anti-infectives: Anti-infectives (From admission, onward)   Start     Dose/Rate Route Frequency Ordered Stop   06/17/20 1130  metroNIDAZOLE (FLAGYL) IVPB 500 mg        500 mg 100 mL/hr over 60 Minutes Intravenous Every 8 hours 06/17/20 1041     06/13/20 1400  cefTRIAXone (ROCEPHIN) 2 g in sodium chloride 0.9 % 100 mL IVPB        2 g 200 mL/hr over 30 Minutes Intravenous Every 24 hours 06/13/20 1242     06/11/20 1300  vancomycin (VANCOREADY) IVPB 2000 mg/400 mL        2,000 mg 200 mL/hr over 120 Minutes Intravenous  Once 06/11/20 1212 06/11/20 1824   06/10/20 2000  clindamycin (CLEOCIN) IVPB 900 mg        900 mg 100 mL/hr over 30 Minutes Intravenous Every 8 hours 06/10/20 1307 06/13/20 1500   06/10/20 1600  ceFEPIme (MAXIPIME) 2 g in sodium chloride 0.9 % 100 mL IVPB  Status:  Discontinued        2 g 200 mL/hr over 30 Minutes Intravenous Every 12 hours 06/10/20 1504 06/13/20 1242   06/10/20 1400  metroNIDAZOLE (FLAGYL) IVPB 500 mg  Status:  Discontinued        500 mg 100 mL/hr over 60 Minutes Intravenous Every 8 hours 06/10/20 1023 06/10/20 1308   06/10/20 1300  aztreonam (AZACTAM) 2 g in sodium chloride 0.9 % 100 mL IVPB  Status:  Discontinued        2 g 200 mL/hr over 30 Minutes Intravenous Every 8 hours 06/10/20 1033 06/10/20 1504   06/10/20 1215  clindamycin (CLEOCIN) IVPB 900 mg        900 mg 100 mL/hr over 30 Minutes Intravenous To Surgery 06/10/20 1210 06/10/20 1210   06/10/20 1033  vancomycin variable dose per unstable renal function (pharmacist dosing)  Status:  Discontinued         Does not apply See admin instructions 06/10/20 1033 06/12/20 1252   06/10/20 0600  metroNIDAZOLE (FLAGYL) tablet 500 mg  Status:  Discontinued        500 mg Oral Every 8 hours 06/10/20 0406 06/10/20 0948   06/10/20 0415   aztreonam (AZACTAM) 2 g in sodium chloride 0.9 % 100 mL IVPB        2 g 200 mL/hr over 30 Minutes Intravenous  Once 06/10/20 0406 06/10/20 0546   06/10/20 0415  vancomycin (VANCOCIN) IVPB 1000 mg/200 mL premix  Status:  Discontinued        1,000 mg 200 mL/hr over 60 Minutes Intravenous  Once 06/10/20 0406 06/10/20 0412   06/10/20 0415  vancomycin (VANCOCIN) 2,500 mg in sodium chloride 0.9 % 500 mL IVPB        2,500 mg 250 mL/hr over 120 Minutes Intravenous  Once 06/10/20 0412 06/10/20 0903       Assessment/Plan A.  Fib VT Arrest (1/10) VDRF DM2 Hypothyroidism Hx HTN  50 yo male with septic shock secondary to perianal/perineal necrotizing soft tissue infection - s/p operative debridement x2 (1/10 and 1/11) - Wound examined at bedside today. No sharp debridement was required. Underlying tissue with some fat necrosis but no operative debridement needed - Continue saline wet-to-dry dressings, currently being changed daily. Rectal tube in place to minimize contamination of the wound with stool  - Continue antibiotics. Intraoperative cultures growing E.Coli (pansensivitie), Strep infantarius - narrowed to Rocephin/Flagyl  - Appreciate CCM's assistance in management of patient shock and MMP - Surgery will continue to follow closely - will see again Thursday or Friday this week - VTE: on heparin gtt for a-fib   LOS: 8 days    Juliet Rude , [facility] Surgery 06/18/2020, 12:40 PM Please see Amion for pager number during day hours 7:00am-4:30pm

## 2020-06-18 NOTE — Progress Notes (Signed)
ANTICOAGULATION CONSULT NOTE - Follow Up Consult  Pharmacy Consult for IV heparin Indication: atrial fibrillation  Patient Measurements: Height: 5\' 10"  (177.8 cm) Weight: (!) 287.1 kg (633 lb) IBW/kg (Calculated) : 73 Heparin Dosing Weight: 149.7 kg  Vital Signs: Temp: 99.9 F (37.7 C) (01/18 0724) Temp Source: Esophageal (01/18 0724) BP: 126/61 (01/18 0724) Pulse Rate: 91 (01/18 0724)  Labs: Recent Labs    06/16/20 0437 06/16/20 0438 06/17/20 0309 06/17/20 1756 06/18/20 0205 06/18/20 0228  HGB 9.0*   < > 8.0*  --  8.8* 8.2*  HCT 28.1*   < > 26.7*  --  26.0* 27.4*  PLT 249  --  261  --   --  288  HEPARINUNFRC  --    < > 0.49 0.48  0.61 0.61  CREATININE 1.98*  --  1.44*  --   --  1.38*   < > = values in this interval not displayed.    Estimated Creatinine Clearance: 146.9 mL/min (A) (by C-G formula based on SCr of 1.38 mg/dL (H)).   Assessment: 49YOM admitted with septic shock secondary to necrotizing fascitis. During this admission, the patient has been in atrial fibrillation with RVR, despite trial of defibrillation; amiodarone was re-loaded while inpatient. Pt has PMH of atrial fibrillation, was on Xarelto and amiodarone PTA.   The patient's heparin level of 0.61 remains therapeutic. The patient is requiring two heparin bags to achieve a therapeutic rate - this has been double checked and verified by pharmacy. Heparin infusion 2150 uts/hr running in each bag. No overt bleeding on physical inspection or noted in chart. No issues with IV infusion.    Goal of Therapy:  Heparin level 0.3-0.7 units/ml Monitor platelets by anticoagulation protocol: Yes   Plan:  - Continue Heparin at 4300 units/hr (this will run as 2 bags at 2150 units/hr each - discussed with RN) - Will continue to monitor for any signs/symptoms of bleeding and CBC daily  - Continue checking levels twice daily while on high rates (0500 and 1700)  2151, PharmD PGY1 Pharmacy Resident 06/18/2020  9:23 AM

## 2020-06-18 NOTE — Progress Notes (Signed)
ANTICOAGULATION CONSULT NOTE - Follow Up Consult  Pharmacy Consult for IV heparin Indication: atrial fibrillation  Patient Measurements: Height: 5\' 10"  (177.8 cm) Weight: (!) 287.1 kg (633 lb) IBW/kg (Calculated) : 73 Heparin Dosing Weight: 149.7 kg  Vital Signs: Temp: 99 F (37.2 C) (01/18 1946) Temp Source: Esophageal (01/18 1946) BP: 119/57 (01/18 1210) Pulse Rate: 77 (01/18 1935)  Labs: Recent Labs    06/16/20 0437 06/16/20 0438 06/17/20 0309 06/17/20 1756 06/18/20 0205 06/18/20 0228  HGB 9.0*   < > 8.0*  --  8.8* 8.2*  HCT 28.1*   < > 26.7*  --  26.0* 27.4*  PLT 249  --  261  --   --  288  HEPARINUNFRC  --    < > 0.49 0.48  0.61 0.61  CREATININE 1.98*  --  1.44*  --   --  1.38*   < > = values in this interval not displayed.    Estimated Creatinine Clearance: 146.9 mL/min (A) (by C-G formula based on SCr of 1.38 mg/dL (H)).   Assessment: 49YOM admitted with septic shock secondary to necrotizing fascitis. During this admission, the patient has been in atrial fibrillation with RVR, despite trial of defibrillation; amiodarone was re-loaded while inpatient. Pt has PMH of atrial fibrillation, was on Xarelto and amiodarone PTA.   The patient's heparin level of 0.48 remains therapeutic. The patient is requiring two heparin bags to achieve a therapeutic rate - this has been double checked and verified by pharmacy. Heparin infusion 2150 uts/hr running in each bag. No overt bleeding on physical inspection or noted in chart. No issues with IV infusion.    Goal of Therapy:  Heparin level 0.3-0.7 units/ml Monitor platelets by anticoagulation protocol: Yes   Plan:  - Continue Heparin at 4300 units/hr (this will run as 2 bags at 2150 units/hr each - discussed with RN) - Will continue to monitor for any signs/symptoms of bleeding and CBC daily  - Continue checking levels twice daily while on high rates (0500 and 1700)  2151, PharmD, [facility] Clinical Pharmacist Please  see AMION for all Pharmacists' Contact Phone Numbers 06/18/2020, 7:48 PM

## 2020-06-18 NOTE — Progress Notes (Signed)
Mg 1.6  Replaced per protocol  

## 2020-06-18 NOTE — Progress Notes (Signed)
Spoke with patients mother.  Updated about current status and treatment plan.  Coralyn Helling, MD [facility] Pulmonary/Critical Care Pager - [phone] 06/18/2020, 3:08 PM

## 2020-06-18 NOTE — Progress Notes (Signed)
NAME:  Carl Hill, MRN:  [id number], DOB:  [birth year], LOS: 8 ADMISSION DATE:  06/10/2020, CONSULTATION DATE:  06/10/2020 REFERRING MD:  Ophelia Charter, CHIEF COMPLAINT:  Severe Sepsis    Brief History:  50 year old white male admitted with necrotizing fasciitis of the left buttock, left upper thigh, and perineum, with associated severe sepsis/septic shock. Critical care consulted  History of Present Illness:  This is a 50 year old chronically ill-appearing obese white male currently lying in bed, lethargic but not in acute distress.   Presented to the emergency room accompanied by his wife the a.m. of 1/10 for evaluation of hypoglycemia, Malaise and fatigue.  Patient had been in his usual state of health until approximately 1 week prior to presentation at which time his wife started to notice he had 2 boils on his left buttocks near the left gluteal fold.  She initially dressed these, after cleaning them.  The patient had progressive pain in the left buttocks, decreased activity, and then wife started noting more hyperglycemia with blood glucose as high as the 400s and because of this he presented to the emergency room.  On arrival to the emergency room he was found to be febrile, his blood glucose was in the 300s, white blood cell count over 17,000, he had a mild lactic acidosis of 3.6, he was tachycardic and hypotensive.   Cultures were sent, CT imaging obtained of the pelvis showing extensive subcutaneous emphysema in the medial aspect of the upper thigh extending throughout the perineum and left medial buttocks with gas extending into the pelvis surrounding the anus.  General surgery was consulted emergently broad-spectrum antibiotic started IV fluid administered critical care consulted and concern for progressive clinical decline  Past Medical History:  HTN, obesity, DM, afib on DOAC  Significant Hospital Events:  1/10  Admitted to [facility]. General surgery consulted and debridement pursued. In the  afternoon, patient developed hypotension with AF in RVR. Synchronized cardioversion unsuccessful with conversion to VT. CPR initated with ROSC after desynchronized defibrillation. 1/11 debridement 1/18 mucus plugging  Consults:  General surgery, PCCM  Procedures:  ETT 1/10 >>  Rt IJ CVL 1/10 >>  Lt brachial a line 1/10 >>   Significant Diagnostic Tests:   CT abd/pelvis 1/10 >>  Extensive subcutaneous emphysema in the medial aspect of the upper thighs extending through perineum and superiorly to the medial left buttock. Gas extends into the pelvis surrounding the anus inferiorly and tracking superiorly along the left wall of the rectum.  Micro Data:  COVID/Flu 1/10 >> negative MRSA PCR 1/10 >> negative Lt buttocks wound 1/10 >> E coli, Streptococcus infantrarious, Peptostreptococcus species, rare Group B Strep Blood 1/10 >> negative  Antimicrobials:  Aztreonam 1/09 Metronidazole 1/09 Vancomycin 1/09, 1/11 Cefepime 1/10 - 1/13  Clindamycin 1/10 -1/13 Ceftriaxone 1/13 >> Metronidazole 1/17 >>   Interim History / Subjective:  Mucus plugging overnight with worsening hypoxia/hypotension.  Improved after suctioning by RT.  Remains on multiple pressors.  Objective   Blood pressure 126/61, pulse 91, temperature 99.9 F (37.7 C), temperature source Esophageal, resp. rate (!) 36, height 5\' 10"  (1.778 m), weight (!) 287.1 kg, SpO2 91 %.    Vent Mode: PCV FiO2 (%):  [60 %-100 %] 100 % Set Rate:  [26 bmp] 26 bmp PEEP:  [10 cmH20-14 cmH20] 14 cmH20 Plateau Pressure:  [25 cmH20-29 cmH20] 25 cmH20   Intake/Output Summary (Last 24 hours) at 06/18/2020 1012 Last data filed at 06/18/2020 0944 Gross per 24 hour  Intake 4497.53 ml  Output  3155 ml  Net 1342.53 ml   Filed Weights   06/13/20 0446 06/14/20 0500 06/18/20 0452  Weight: (!) 296.7 kg (!) 290.3 kg (!) 287.1 kg   Examination:  General - sedated Eyes - pupils reactive ENT - ETT in place Cardiac - irregular Chest - decreased  BS at Rt base, b/l rhonchi Abdomen - soft, non tender, decreased bowel sounds Extremities - 1+ edema Skin - wound dressing clean Neuro - RASS -2  Resolved problems:  VT arrest 1/10, Ileus 1/15, Elevated LFTs from shock, Metabolic acidosis with lactic acidosis  Assessment & Plan:   Septic shock from necrotizing fasciitis of Lt buttocks s/p debridement. - pressors to keep MAP > 65 - wound care per surgery - day 10 of Abx, currently on rocephin and flagyl  Acute on chronic hypoxic/hypercapnic respiratory failure in setting of sepsis. - Worsening hypoxia on 1/18 likely from mucus plugging - continue bronchial hygiene and f/u CXR; might need bronchoscopy - goal SpO2 > 90%  A fib with RVR. HLD. - continue lipitor - continue heparin gtt - transition amiodarone to enteral  DM type 2 poorly controlled with hyperglycemia. - SSI  Hypothyroidism. - continue synthroid  Acute metabolic encephalopathy 2nd to sepsis. 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 - RASS goal 0 to -1 - continue lyrica, nortriptyline  Anemia of critical illness. - f/u CBC - transfuse for Hb < 7 or significant bleeding  Best practice (evaluated daily)  Diet: tube feeds DVT prophylaxis: Heparin gtt  GI prophylaxis: Protonix  Mobility: Bedrest Disposition: ICU Family: discussed his status, active issues and care plan with his mom by phone on 1/17 Code status: full code  Labs    CMP Latest Ref Rng & Units 06/18/2020 06/18/2020 06/17/2020  Glucose 70 - 99 mg/dL 175(Z) - 025(E)  BUN 6 - 20 mg/dL 52(D) - 78(E)  Creatinine 0.61 - 1.24 mg/dL 4.23(N) - 3.61(W)  Sodium 135 - 145 mmol/L 143 143 140  Potassium 3.5 - 5.1 mmol/L 4.4 4.3 4.1  Chloride 98 - 111 mmol/L 106 - 103  CO2 22 - 32 mmol/L 26 - 26  Calcium 8.9 - 10.3 mg/dL 8.0(L) - 8.2(L)  Total Protein 6.5 - 8.1 g/dL - - -  Total Bilirubin 0.3 - 1.2 mg/dL - - -  Alkaline Phos 38 - 126 U/L - - -  AST 15 - 41 U/L - - -  ALT 0 - 44 U/L - - -    CBC Latest Ref  Rng & Units 06/18/2020 06/18/2020 06/17/2020  WBC 4.0 - 10.5 K/uL 21.3(H) - 21.4(H)  Hemoglobin 13.0 - 17.0 g/dL 8.2(L) 8.8(L) 8.0(L)  Hematocrit 39.0 - 52.0 % 27.4(L) 26.0(L) 26.7(L)  Platelets 150 - 400 K/uL 288 - 261    ABG    Component Value Date/Time   PHART 7.289 (L) 06/18/2020 0205   PCO2ART 62.6 (H) 06/18/2020 0205   PO2ART 65 (L) 06/18/2020 0205   HCO3 29.9 (H) 06/18/2020 0205   TCO2 32 06/18/2020 0205   ACIDBASEDEF 2.0 06/13/2020 1221   O2SAT 89.0 06/18/2020 0205    CBG (last 3)  Recent Labs    06/17/20 2343 06/18/20 0345 06/18/20 0820  GLUCAP 167* 142* 214*   Critical care time: 33 minutes  Coralyn Helling, MD [place] Pulmonary/Critical Care Pager - [phone] 06/18/2020, 10:39 AM

## 2020-06-18 NOTE — Progress Notes (Signed)
eLink Physician-Brief Progress Note Patient Name: Carl Hill DOB: 09-29-1970 MRN: [id number]   Date of Service  06/18/2020  HPI/Events of Note  Notified of increasing pressor requirement Also with increased O2 requirement On PAC 26/28/100%/10 PEEP, peak pressure 32  eICU Interventions  Ordered to start levophed CXR and ABG stat Will inform bedside CCM team to assess     Intervention Category Major Interventions: Hypotension - evaluation and management;Hypoxemia - evaluation and management  Darl Pikes 06/18/2020, 1:51 AM

## 2020-06-18 NOTE — Significant Event (Signed)
Called to bedside for progressive hypoxia and hypotension. Currently on 400 mcg/min NEO and Vasopressin at 0.04, levophed added.   Add scheduled albumin and stress dose steroids. CXR with possible large right lung pleural effusion, unable to clearly assess this with ultrasound given patient body habitus.   ABG 7.289/62.6/65. Increased PEEP to 14. Morning labs pending. Currently BP 113/51 with MAP 70. Oxygen Saturation 96%.

## 2020-06-19 ENCOUNTER — Inpatient Hospital Stay (HOSPITAL_COMMUNITY): Payer: Medicaid Other

## 2020-06-19 DIAGNOSIS — T17500A Unspecified foreign body in bronchus causing asphyxiation, initial encounter: Secondary | ICD-10-CM

## 2020-06-19 DIAGNOSIS — J9602 Acute respiratory failure with hypercapnia: Secondary | ICD-10-CM | POA: Diagnosis not present

## 2020-06-19 DIAGNOSIS — A419 Sepsis, unspecified organism: Secondary | ICD-10-CM | POA: Diagnosis not present

## 2020-06-19 DIAGNOSIS — J9601 Acute respiratory failure with hypoxia: Secondary | ICD-10-CM | POA: Diagnosis not present

## 2020-06-19 DIAGNOSIS — R6521 Severe sepsis with septic shock: Secondary | ICD-10-CM | POA: Diagnosis not present

## 2020-06-19 LAB — BASIC METABOLIC PANEL
Anion gap: 8 (ref 5–15)
BUN: 43 mg/dL — ABNORMAL HIGH (ref 6–20)
CO2: 28 mmol/L (ref 22–32)
Calcium: 8.1 mg/dL — ABNORMAL LOW (ref 8.9–10.3)
Chloride: 110 mmol/L (ref 98–111)
Creatinine, Ser: 1.23 mg/dL (ref 0.61–1.24)
GFR, Estimated: >60 mL/min (ref >60)
Glucose, Bld: 140 mg/dL — ABNORMAL HIGH (ref 70–99)
Potassium: 4.1 mmol/L (ref 3.5–5.1)
Sodium: 146 mmol/L — ABNORMAL HIGH (ref 135–145)

## 2020-06-19 LAB — CBC
HCT: 25.1 % — ABNORMAL LOW (ref 39.0–52.0)
Hemoglobin: 7.7 g/dL — ABNORMAL LOW (ref 13.0–17.0)
MCH: 29.4 pg (ref 26.0–34.0)
MCHC: 30.7 g/dL (ref 30.0–36.0)
MCV: 95.8 fL (ref 80.0–100.0)
Platelets: 295 10*3/uL (ref 150–400)
RBC: 2.62 MIL/uL — ABNORMAL LOW (ref 4.22–5.81)
RDW: 17.7 % — ABNORMAL HIGH (ref 11.5–15.5)
WBC: 17.8 10*3/uL — ABNORMAL HIGH (ref 4.0–10.5)
nRBC: 11.6 % — ABNORMAL HIGH (ref 0.0–0.2)

## 2020-06-19 LAB — GLUCOSE, CAPILLARY
Glucose-Capillary: 127 mg/dL — ABNORMAL HIGH (ref 70–99)
Glucose-Capillary: 112 mg/dL — ABNORMAL HIGH (ref 70–99)
Glucose-Capillary: 152 mg/dL — ABNORMAL HIGH (ref 70–99)
Glucose-Capillary: 162 mg/dL — ABNORMAL HIGH (ref 70–99)
Glucose-Capillary: 185 mg/dL — ABNORMAL HIGH (ref 70–99)
Glucose-Capillary: 176 mg/dL — ABNORMAL HIGH (ref 70–99)

## 2020-06-19 LAB — TRIGLYCERIDES: Triglycerides: 85 mg/dL (ref <150)

## 2020-06-19 LAB — HEPARIN LEVEL (UNFRACTIONATED)
Heparin Unfractionated: 0.56 IU/mL (ref 0.30–0.70)
Heparin Unfractionated: 0.8 IU/mL — ABNORMAL HIGH (ref 0.30–0.70)

## 2020-06-19 LAB — MAGNESIUM: Magnesium: 1.6 mg/dL — ABNORMAL LOW (ref 1.7–2.4)

## 2020-06-19 MED ORDER — MIDAZOLAM BOLUS VIA INFUSION
1.0000 mg | INTRAVENOUS | Status: DC | PRN
Start: 2020-06-19 — End: 2020-06-27
  Filled 2020-06-19: qty 2

## 2020-06-19 MED ORDER — MAGNESIUM SULFATE 4 GM/100ML IV SOLN
4.0000 g | Freq: Once | INTRAVENOUS | Status: AC
  Administered 2020-06-19: 4 g via INTRAVENOUS
  Filled 2020-06-19: qty 100

## 2020-06-19 MED ORDER — MIDAZOLAM 50MG/50ML (1MG/ML) PREMIX INFUSION
0.0000 mg/h | INTRAVENOUS | Status: DC
  Administered 2020-06-19: 3 mg/h via INTRAVENOUS
  Administered 2020-06-19: 2 mg/h via INTRAVENOUS
  Administered 2020-06-20: 3 mg/h via INTRAVENOUS
  Filled 2020-06-19 (×3): qty 50

## 2020-06-19 NOTE — Progress Notes (Signed)
ANTICOAGULATION CONSULT NOTE - Follow Up Consult  Pharmacy Consult for Heparin Indication: atrial fibrillation  Patient Measurements: Height: 5\' 10"  (177.8 cm) Weight: (!) 286.7 kg (632 lb) IBW/kg (Calculated) : 73 Heparin Dosing Weight: 149.7 kg  Vital Signs: Temp: 103 F (39.4 C) (01/19 0816) Temp Source: Rectal (01/19 0816) BP: 128/54 (01/19 0802) Pulse Rate: 88 (01/19 0802)  Labs: Recent Labs    06/17/20 0309 06/17/20 1756 06/18/20 0205 06/18/20 0228 06/18/20 1735 06/19/20 0415  HGB 8.0*  --  8.8* 8.2*  --  7.7*  HCT 26.7*  --  26.0* 27.4*  --  25.1*  PLT 261  --   --  288  --  295  HEPARINUNFRC 0.49   < >  --  0.61 0.48 0.56  CREATININE 1.44*  --   --  1.38*  --  1.23   < > = values in this interval not displayed.    Estimated Creatinine Clearance: 164.7 mL/min (by C-G formula based on SCr of 1.23 mg/dL).    Assessment: 49YOM admitted with septic shock secondary to necrotizing fascitis. During this admission, the patient has been in atrial fibrillation with RVR, despite trial of defibrillation; amiodarone was re-loaded while inpatient. Pt has PMH of atrial fibrillation, was on Xarelto and amiodarone PTA.   The patient's heparin level of 0.56 remains therapeutic. The patient is requiring two heparin bags to achieve a therapeutic rate - this has been double checked and verified by pharmacy. Heparin infusion 2150 uts/hr running in each bag. No overt bleeding on physical inspection or noted in chart. No issues with IV infusion.    Goal of Therapy:  Heparin level 0.3-0.7 units/ml Monitor platelets by anticoagulation protocol: Yes   Plan:  - Continue Heparin at 4300 units/hr (this will run as 2 bags at 2150 units/hr each - discussed with RN) - Will continue to monitor for any signs/symptoms of bleeding and CBC daily  - Continue checking levels twice daily while on high rates (0500 and 1700)

## 2020-06-19 NOTE — Procedures (Signed)
Bronchoscopy Procedure Note  HYUN MARSALIS  [id number]  March 23, 1971  Date:06/19/20  Time:4:38 PM   Provider Performing:Fumiko Cham   Procedure(s):  Flexible bronchoscopy with bronchial alveolar lavage (24401)  Indication(s) Mucus plugging.  Consent Risks of the procedure as well as the alternatives and risks of each were explained to the patient and/or caregiver.  Consent for the procedure was obtained and is signed in the bedside chart  Anesthesia Versed, fentanyl  Time Out Verified patient identification, verified procedure, site/side was marked, verified correct patient position, special equipment/implants available, medications/allergies/relevant history reviewed, required imaging and test results available.   Sterile Technique Usual hand hygiene, masks, gowns, and gloves were used   Procedure Description Bronchoscope advanced through endotracheal tube and into airway.  Airways were examined down to subsegmental level with findings noted below.   Following diagnostic evaluation, BAL(s) performed in right upper lobe with normal saline and return of 20 ml of clear fluid with plugs fluid  Findings: thick respiratory secretions, no endobronchial lesions   Complications/Tolerance None; patient tolerated the procedure well. Chest X-ray is not needed post procedure.   EBL Minimal   Specimen(s) BAL right upper lobe  Chesley Mires, MD [place] Pager - [phone] 06/19/2020, 4:40 PM

## 2020-06-19 NOTE — Progress Notes (Signed)
Mg 1.6  Replaced per protocol  

## 2020-06-19 NOTE — Progress Notes (Signed)
NAME:  Carl Hill, MRN:  [id number], DOB:  [birth year], LOS: 9 ADMISSION DATE:  06/10/2020, CONSULTATION DATE:  06/10/2020 REFERRING MD:  Ophelia Charter, CHIEF COMPLAINT:  Severe Sepsis    Brief History:  50 year old white male admitted with necrotizing fasciitis of the left buttock, left upper thigh, and perineum, with associated severe sepsis/septic shock. Critical care consulted  History of Present Illness:  This is a 50 year old chronically ill-appearing obese white male currently lying in bed, lethargic but not in acute distress.   Presented to the emergency room accompanied by his wife the a.m. of 1/10 for evaluation of hypoglycemia, Malaise and fatigue.  Patient had been in his usual state of health until approximately 1 week prior to presentation at which time his wife started to notice he had 2 boils on his left buttocks near the left gluteal fold.  She initially dressed these, after cleaning them.  The patient had progressive pain in the left buttocks, decreased activity, and then wife started noting more hyperglycemia with blood glucose as high as the 400s and because of this he presented to the emergency room.  On arrival to the emergency room he was found to be febrile, his blood glucose was in the 300s, white blood cell count over 17,000, he had a mild lactic acidosis of 3.6, he was tachycardic and hypotensive.   Cultures were sent, CT imaging obtained of the pelvis showing extensive subcutaneous emphysema in the medial aspect of the upper thigh extending throughout the perineum and left medial buttocks with gas extending into the pelvis surrounding the anus.  General surgery was consulted emergently broad-spectrum antibiotic started IV fluid administered critical care consulted and concern for progressive clinical decline  Past Medical History:  HTN, obesity, DM, afib on DOAC  Significant Hospital Events:  1/10  Admitted to [facility]. General surgery consulted and debridement pursued. In the  afternoon, patient developed hypotension with AF in RVR. Synchronized cardioversion unsuccessful with conversion to VT. CPR initated with ROSC after desynchronized defibrillation. 1/11 debridement 1/18 mucus plugging  Consults:  General surgery, PCCM  Procedures:  ETT 1/10 >>  Rt IJ CVL 1/10 >>  Lt brachial a line 1/10 >>   Significant Diagnostic Tests:   CT abd/pelvis 1/10 >>  Extensive subcutaneous emphysema in the medial aspect of the upper thighs extending through perineum and superiorly to the medial left buttock. Gas extends into the pelvis surrounding the anus inferiorly and tracking superiorly along the left wall of the rectum.  Micro Data:  COVID/Flu 1/10 >> negative MRSA PCR 1/10 >> negative Lt buttocks wound 1/10 >> E coli, Streptococcus infantrarious, Peptostreptococcus species, rare Group B Strep Blood 1/10 >> negative  Antimicrobials:  Aztreonam 1/09 Metronidazole 1/09 Vancomycin 1/09, 1/11 Cefepime 1/10 - 1/13  Clindamycin 1/10 -1/13 Ceftriaxone 1/13 >>  Metronidazole 1/17 >>   Interim History / Subjective:  Remains on increased PEEP/FiO2, sedation, pressors.  Objective   Blood pressure (!) 128/54, pulse 88, temperature (!) 103 F (39.4 C), temperature source Rectal, resp. rate 18, height 5\' 10"  (1.778 m), weight (!) 286.7 kg, SpO2 96 %.    Vent Mode: PCV FiO2 (%):  [100 %] 100 % Set Rate:  [26 bmp] 26 bmp PEEP:  [14 cmH20] 14 cmH20 Plateau Pressure:  [25 cmH20-30 cmH20] 25 cmH20   Intake/Output Summary (Last 24 hours) at 06/19/2020 1034 Last data filed at 06/19/2020 0805 Gross per 24 hour  Intake 4220.99 ml  Output 4512 ml  Net -291.01 ml   06/21/2020  06/14/20 0500 06/18/20 0452 06/19/20 0411  Weight: (!) 290.3 kg (!) 287.1 kg (!) 286.7 kg   Examination:  General - sedated Eyes - pupils reactive ENT - ETT in place Cardiac - regular rate/rhythm, no murmur Chest - decreased BS Abdomen - soft, non tender, decreased bowel  sounds Extremities - 1+ edema, legs in wrap Skin - wound dressing clean Neuro - RASS -3  Resolved problems:  VT arrest 1/10, Ileus 1/15, Elevated LFTs from shock, Metabolic acidosis with lactic acidosis  Assessment & Plan:   Septic shock from necrotizing fasciitis of Lt buttocks s/p debridement. Concern for HCAP 1/19. - pressors to keep MAP > 65 - wound care - day 11 of ABx  Acute on chronic hypoxic/hypercapnic respiratory failure in setting of sepsis. - Worsening hypoxia on 1/18 likely from mucus plugging - has persistent changes on CXR from 1/19 - will arrange for bronchoscopy; discussed with pt's mother >> reviewed risks/benefits and she is agreeable to have [place] proceed with bronchoscopy - goal SpO2 > 90%  A fib with RVR. HLD. - continue lipitor - continue heparin gtt - transition amiodarone to enteral  DM type 2 poorly controlled with hyperglycemia. - SSI  Hypothyroidism. - continue synthroid  Acute metabolic encephalopathy 2nd to sepsis. 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 - RASS goal -1 - continue lyrica, nortriptyline  Anemia of critical illness. - f/u CBC - transfuse for Hb < 7 or significant bleeding  Best practice (evaluated daily)  Diet: tube feeds DVT prophylaxis: Heparin gtt  GI prophylaxis: Protonix  Mobility: Bedrest Disposition: ICU Family: updated pt's mother at bedside on 1/19 Code status: full code  Labs    CMP Latest Ref Rng & Units 06/19/2020 06/18/2020 06/18/2020  Glucose 70 - 99 mg/dL 109(N) 235(T) -  BUN 6 - 20 mg/dL 73(U) 20(U) -  Creatinine 0.61 - 1.24 mg/dL 5.42 7.06(C) -  Sodium 135 - 145 mmol/L 146(H) 143 143  Potassium 3.5 - 5.1 mmol/L 4.1 4.4 4.3  Chloride 98 - 111 mmol/L 110 106 -  CO2 22 - 32 mmol/L 28 26 -  Calcium 8.9 - 10.3 mg/dL 8.1(L) 8.0(L) -  Total Protein 6.5 - 8.1 g/dL - - -  Total Bilirubin 0.3 - 1.2 mg/dL - - -  Alkaline Phos 38 - 126 U/L - - -  AST 15 - 41 U/L - - -  ALT 0 - 44 U/L - - -    CBC Latest Ref Rng  & Units 06/19/2020 06/18/2020 06/18/2020  WBC 4.0 - 10.5 K/uL 17.8(H) 21.3(H) -  Hemoglobin 13.0 - 17.0 g/dL 7.7(L) 8.2(L) 8.8(L)  Hematocrit 39.0 - 52.0 % 25.1(L) 27.4(L) 26.0(L)  Platelets 150 - 400 K/uL 295 288 -    ABG    Component Value Date/Time   PHART 7.289 (L) 06/18/2020 0205   PCO2ART 62.6 (H) 06/18/2020 0205   PO2ART 65 (L) 06/18/2020 0205   HCO3 29.9 (H) 06/18/2020 0205   TCO2 32 06/18/2020 0205   ACIDBASEDEF 2.0 06/13/2020 1221   O2SAT 89.0 06/18/2020 0205    CBG (last 3)  Recent Labs    06/18/20 2315 06/19/20 0328 06/19/20 0813  GLUCAP 162* 127* 112*   Critical care time: 34 minutes  Coralyn Helling, MD [place] [facility] Pager - [phone] 06/19/2020, 10:34 AM

## 2020-06-19 NOTE — Progress Notes (Signed)
RT assisted MD with bronchostomy procedure. During bronch RT advanced ETT 2cm per MD. ETT is now 26@lips . RN at bedside. No complications during procedure. RT will continue to monitor.

## 2020-06-19 NOTE — Progress Notes (Signed)
RT advanced ETT 2cm per MD order due to cuff leak. ETT was 24@lips  and ETT is now 26@lips . RT will continue to monitor.

## 2020-06-19 NOTE — Progress Notes (Addendum)
ANTICOAGULATION CONSULT NOTE - Follow Up Consult  Pharmacy Consult for Heparin Indication: atrial fibrillation  Patient Measurements: Height: 5\' 10"  (177.8 cm) Weight: (!) 286.7 kg (632 lb) IBW/kg (Calculated) : 73 Heparin Dosing Weight: 149.7 kg  Vital Signs: Temp: 98.3 F (36.8 C) (01/19 1631) Temp Source: Axillary (01/19 1631) BP: 119/53 (01/19 1715) Pulse Rate: 83 (01/19 1715)  Labs: Recent Labs    06/17/20 0309 06/17/20 1756 06/18/20 0205 06/18/20 0228 06/18/20 1735 06/19/20 0415 06/19/20 1734  HGB 8.0*  --  8.8* 8.2*  --  7.7*  --   HCT 26.7*  --  26.0* 27.4*  --  25.1*  --   PLT 261  --   --  288  --  295  --   HEPARINUNFRC 0.49   < >  --  0.61 0.48 0.56 0.80*  CREATININE 1.44*  --   --  1.38*  --  1.23  --    < > = values in this interval not displayed.    Estimated Creatinine Clearance: 164.7 mL/min (by C-G formula based on SCr of 1.23 mg/dL).    Assessment: 49YOM admitted with septic shock secondary to necrotizing fascitis. During this admission, the patient has been in atrial fibrillation with RVR, despite trial of defibrillation; amiodarone was re-loaded while inpatient. Pt has PMH of atrial fibrillation, was on Xarelto and amiodarone PTA.   Heparin level trended up, now supratherapeutic at 0.8 tonight. The patient is requiring two heparin bags to achieve a therapeutic rate - this has been double checked and verified by pharmacy. Heparin infusion 2150 units/hr running in each bag. Hg trending down slowly to 7.7, plt wnl, SCr improving. No bleeding or issues with infusion per discussion with RN. Level appears to have been drawn correctly.   Goal of Therapy:  Heparin level 0.3-0.7 units/ml Monitor platelets by anticoagulation protocol: Yes   Plan:  - Reduce heparin to 4100 units/hr (this will run as 2 bags at 2050 units/hr each - discussed with RN) - Next heparin level with AM labs - Will continue to monitor for any signs/symptoms of bleeding and CBC  daily   06/21/20, PharmD, BCPS Please check AMION for all [facility] contact numbers Clinical Pharmacist 06/19/2020 7:16 PM

## 2020-06-20 ENCOUNTER — Inpatient Hospital Stay (HOSPITAL_COMMUNITY): Payer: Medicaid Other

## 2020-06-20 DIAGNOSIS — J9602 Acute respiratory failure with hypercapnia: Secondary | ICD-10-CM | POA: Diagnosis not present

## 2020-06-20 DIAGNOSIS — J9601 Acute respiratory failure with hypoxia: Secondary | ICD-10-CM | POA: Diagnosis not present

## 2020-06-20 DIAGNOSIS — J189 Pneumonia, unspecified organism: Secondary | ICD-10-CM | POA: Diagnosis not present

## 2020-06-20 DIAGNOSIS — M726 Necrotizing fasciitis: Secondary | ICD-10-CM | POA: Diagnosis not present

## 2020-06-20 LAB — CBC
HCT: 26.8 % — ABNORMAL LOW (ref 39.0–52.0)
Hemoglobin: 8 g/dL — ABNORMAL LOW (ref 13.0–17.0)
MCH: 28.7 pg (ref 26.0–34.0)
MCHC: 29.9 g/dL — ABNORMAL LOW (ref 30.0–36.0)
MCV: 96.1 fL (ref 80.0–100.0)
Platelets: 300 10*3/uL (ref 150–400)
RBC: 2.79 MIL/uL — ABNORMAL LOW (ref 4.22–5.81)
RDW: 18.2 % — ABNORMAL HIGH (ref 11.5–15.5)
WBC: 18.3 10*3/uL — ABNORMAL HIGH (ref 4.0–10.5)
nRBC: 4.4 % — ABNORMAL HIGH (ref 0.0–0.2)

## 2020-06-20 LAB — GLUCOSE, CAPILLARY
Glucose-Capillary: 157 mg/dL — ABNORMAL HIGH (ref 70–99)
Glucose-Capillary: 160 mg/dL — ABNORMAL HIGH (ref 70–99)
Glucose-Capillary: 167 mg/dL — ABNORMAL HIGH (ref 70–99)
Glucose-Capillary: 183 mg/dL — ABNORMAL HIGH (ref 70–99)
Glucose-Capillary: 203 mg/dL — ABNORMAL HIGH (ref 70–99)
Glucose-Capillary: 226 mg/dL — ABNORMAL HIGH (ref 70–99)

## 2020-06-20 LAB — HEPARIN LEVEL (UNFRACTIONATED)
Heparin Unfractionated: 0.61 IU/mL (ref 0.30–0.70)
Heparin Unfractionated: 0.59 IU/mL (ref 0.30–0.70)

## 2020-06-20 LAB — BASIC METABOLIC PANEL
Anion gap: 9 (ref 5–15)
BUN: 44 mg/dL — ABNORMAL HIGH (ref 6–20)
CO2: 26 mmol/L (ref 22–32)
Calcium: 7.8 mg/dL — ABNORMAL LOW (ref 8.9–10.3)
Chloride: 111 mmol/L (ref 98–111)
Creatinine, Ser: 1.13 mg/dL (ref 0.61–1.24)
GFR, Estimated: >60 mL/min (ref >60)
Glucose, Bld: 192 mg/dL — ABNORMAL HIGH (ref 70–99)
Potassium: 4.3 mmol/L (ref 3.5–5.1)
Sodium: 146 mmol/L — ABNORMAL HIGH (ref 135–145)

## 2020-06-20 LAB — TRIGLYCERIDES: Triglycerides: 117 mg/dL (ref <150)

## 2020-06-20 LAB — MAGNESIUM: Magnesium: 1.7 mg/dL (ref 1.7–2.4)

## 2020-06-20 MED ORDER — POLYETHYLENE GLYCOL 3350 17 G PO PACK
17.0000 g | PACK | Freq: Two times a day (BID) | ORAL | Status: DC
  Administered 2020-06-20 – 2020-07-07 (×33): 17 g
  Filled 2020-06-20 (×33): qty 1

## 2020-06-20 MED ORDER — VANCOMYCIN HCL 10 G IV SOLR
2500.0000 mg | Freq: Two times a day (BID) | INTRAVENOUS | Status: DC
  Administered 2020-06-20 – 2020-06-22 (×4): 2500 mg via INTRAVENOUS
  Filled 2020-06-20 (×7): qty 2500

## 2020-06-20 MED ORDER — SODIUM CHLORIDE 0.9 % IV SOLN
2.0000 g | Freq: Three times a day (TID) | INTRAVENOUS | Status: DC
  Administered 2020-06-20 – 2020-06-22 (×7): 2 g via INTRAVENOUS
  Filled 2020-06-20 (×7): qty 2

## 2020-06-20 MED ORDER — ALBUTEROL SULFATE (2.5 MG/3ML) 0.083% IN NEBU: 2.5000 mg | INHALATION_SOLUTION | RESPIRATORY_TRACT | Status: DC | PRN

## 2020-06-20 NOTE — Progress Notes (Addendum)
NAME:  Carl Hill, MRN:  [id number], DOB:  11-04-1970, LOS: 97 ADMISSION DATE:  06/10/2020, CONSULTATION DATE:  06/10/2020 REFERRING MD:  Lorin Mercy, CHIEF COMPLAINT:  Severe Sepsis    Brief History:  50 yo male presented with buttock pain, fatigue and malaise.  Found to have hyperglycemia.  CT pelvis showed changes of necrotizing fasciitis in Lt buttock, Lt upper thigh, and perineum.  Taken to OR for debridement and remained on pressors and ventilator.  Past Medical History:  HTN, obesity, DM, afib on DOAC  Significant Hospital Events:  1/10  Admitted to [facility]. General surgery consulted and debridement pursued. In the afternoon, patient developed hypotension with AF in RVR. Synchronized cardioversion unsuccessful with conversion to VT. CPR initated with ROSC after desynchronized defibrillation. 1/11 debridement 1/18 mucus plugging 1/19 bronchoscopy >> mucus plug on Rt, airway collapse with exhalation 1/20 persistent fever, change ABx  Consults:  General surgery  Procedures:  ETT 1/10 >>  Rt IJ CVL 1/10 >>  Lt brachial a line 1/10 >>   Significant Diagnostic Tests:   CT abd/pelvis 1/10 >>  Extensive subcutaneous emphysema in the medial aspect of the upper thighs extending through perineum and superiorly to the medial left buttock. Gas extends into the pelvis surrounding the anus inferiorly and tracking superiorly along the left wall of the rectum.  Micro Data:  COVID/Flu 1/10 >> negative MRSA PCR 1/10 >> negative Lt buttocks wound 1/10 >> E coli, Streptococcus infantrarious, Peptostreptococcus species, rare Group B Strep Blood 1/10 >> negative BAL 1/20   Antimicrobials:  Aztreonam 1/09 Metronidazole 1/09 Vancomycin 1/09, 1/11 Cefepime 1/10 - 1/13  Clindamycin 1/10 -1/13 Ceftriaxone 1/13 >> 1/20 Metronidazole 1/17 >>  Cefepime 1/20 >> Vancomycin 1/20 >>  Interim History / Subjective:  Remains on multiple sedatives, pressors.  Remains on increased PEEP/FiO2.  Persistent  fever.  Objective   Blood pressure (!) 131/54, pulse 68, temperature 98.8 F (37.1 C), temperature source Esophageal, resp. rate 20, height _0  (1.778 m), weight (!) 286.7 kg, SpO2 96 %.    Vent Mode: PCV FiO2 (%):  [100 %] 100 % Set Rate:  [26 bmp] 26 bmp PEEP:  [14 cmH20] 14 cmH20 Plateau Pressure:  [23 cmH20-35 cmH20] 30 cmH20   Intake/Output Summary (Last 24 hours) at 06/20/2020 0933 Last data filed at 06/20/2020 0900 Gross per 24 hour  Intake 5647.84 ml  Output 4175 ml  Net 1472.84 ml   Filed Weights   06/14/20 0500 06/18/20 0452 06/19/20 0411  Weight: (!) 290.3 kg (!) 287.1 kg (!) 286.7 kg   Examination:  General - sedated Eyes - pupils reactive ENT - ETT in place Cardiac - regular rate/rhythm, no murmur Chest - decreased breath sounds, scattered rhonchi Abdomen - soft, non tender, decreased bowel sounds Extremities - 2+ non pitting edema Skin - lower legs in wrap Neuro - RASS -4  Resolved problems:  VT arrest 1/10, Ileus 1/15, Elevated LFTs from shock, Metabolic acidosis with lactic acidosis  Assessment & Plan:   Septic shock from necrotizing fasciitis of Lt buttocks s/p debridement. Concern for HCAP 1/20 with persistent fever. - pressors to keep MAP > 65 - wound care - reported PCN allergy, but has tolerated rocephin - day 12 of ABx; change to vancomycin and cefepime on 1/20 and continue flagyl  Acute on chronic hypoxic/hypercapnic respiratory failure in setting of sepsis. - noted to have mucus plugging and dynamic airway collapse during bronchoscopy on 1/19 - using pressure control - keep PEEP at 12 cm H2O -  adjust FiO2 to keep SpO2 90 to 95% - f/u CXR - likely will need trach to assist with weaning; have been d/w pt's family  A fib with RVR. HLD. - continue lipitor, amiodarone - heparin gtt  DM type 2 poorly controlled with hyperglycemia. - SSI  Hypothyroidism. - continue synthroid  Acute metabolic encephalopathy 2nd to sepsis. Chronic  pain from DM neuropathy. - RASS goal -1 to -2 - try to wean off diprivan and continue versed/fentanyl - continue lyrica, nortriptyline  Anemia of critical illness. - f/u CBC - transfuse for Hb < 7 or significant bleeding  Best practice (evaluated daily)  Diet: tube feeds DVT prophylaxis: Heparin gtt  GI prophylaxis: Protonix  Mobility: Bedrest Disposition: ICU Family: updated pt's mother at bedside on 1/19 Code status: full code  Labs    CMP Latest Ref Rng & Units 06/20/2020 06/19/2020 06/18/2020  Glucose 70 - 99 mg/dL 192(H) 140(H) 175(H)  BUN 6 - 20 mg/dL 44(H) 43(H) 48(H)  Creatinine 0.61 - 1.24 mg/dL 1.13 1.23 1.38(H)  Sodium 135 - 145 mmol/L 146(H) 146(H) 143  Potassium 3.5 - 5.1 mmol/L 4.3 4.1 4.4  Chloride 98 - 111 mmol/L 111 110 106  CO2 22 - 32 mmol/L _0 Calcium 8.9 - 10.3 mg/dL 7.8(L) 8.1(L) 8.0(L)  Total Protein 6.5 - 8.1 g/dL - - -  Total Bilirubin 0.3 - 1.2 mg/dL - - -  Alkaline Phos 38 - 126 U/L - - -  AST 15 - 41 U/L - - -  ALT 0 - 44 U/L - - -    CBC Latest Ref Rng & Units 06/20/2020 06/19/2020 06/18/2020  WBC 4.0 - 10.5 K/uL PENDING 17.8(H) 21.3(H)  Hemoglobin 13.0 - 17.0 g/dL 8.0(L) 7.7(L) 8.2(L)  Hematocrit 39.0 - 52.0 % 26.8(L) 25.1(L) 27.4(L)  Platelets 150 - 400 K/uL 300 295 288    ABG    Component Value Date/Time   PHART 7.289 (L) 06/18/2020 0205   PCO2ART 62.6 (H) 06/18/2020 0205   PO2ART 65 (L) 06/18/2020 0205   HCO3 29.9 (H) 06/18/2020 0205   TCO2 32 06/18/2020 0205   ACIDBASEDEF 2.0 06/13/2020 1221   O2SAT 89.0 06/18/2020 0205    CBG (last 3)  Recent Labs    06/19/20 2325 06/20/20 0331 06/20/20 0749  GLUCAP 176* 157* 160*   Critical care time: 33 minutes  Chesley Mires, MD [place] Pager - [phone] 06/20/2020, 9:33 AM

## 2020-06-20 NOTE — Progress Notes (Signed)
Pharmacy Antibiotic Note  Carl Hill is a 50 y.o. male admitted on 06/10/2020 with pneumonia. Fevers are persistant, Tmax 102, last WBC trending up to 17.8, results still pending from AM labs. D12 abx. Pharmacy has been consulted for Vancomycin and Cefepime dosing.  Plan: Vancomycin 2500mg  loading dose then 2500mg  q12 IV. Calculated AUC 447, goal 400-550. Cefepime 2g q8hr IV Monitor cultures, wbc, fever curve and renal function for adjustments  Height: 5\' 10"  (177.8 cm) Weight: (!) 286.7 kg (632 lb) IBW/kg (Calculated) : 73  Temp (24hrs), Avg:99.4 F (37.4 C), Min:97.1 F (36.2 C), Max:102 F (38.9 C)  Recent Labs  Lab 06/16/20 0437 06/17/20 0309 06/18/20 0228 06/19/20 0415 06/20/20 0519  WBC 22.6* 21.4* 21.3* 17.8* PENDING  CREATININE 1.98* 1.44* 1.38* 1.23 1.13    Estimated Creatinine Clearance: 179.2 mL/min (by C-G formula based on SCr of 1.13 mg/dL).    Allergies  Allergen Reactions  . Bee Venom Anaphylaxis    Other reaction(s): Unknown Other reaction(s): Unknown Other reaction(s): Unknown   . Penicillins Hives and Anaphylaxis    unknown   . Sglt2 Inhibitors Other (See Comments)    Necrotizing infection of the perineum  . Codeine Other (See Comments)    Pt states that his mother told him he is allergic, but has taken this medication multiple ties with no problems.    . Other Hives    Antimicrobials this admission: 1/10 aztreonam >> 1/10 1/10 flagyl x1 1/17>> 1/10 vanc >> 1/12 1/10 cefepime >> 1/13 1/10 clinda >> 1/13 1/13 CTX>> 1/20 1/20 vanc >> 1/20 cefepime >>  Microbiology results: 1/10 wound: rare E. Coli; rare group B strep, rare s. Infantarius, r, s. Constellatus, peptostrep (anaerobe)   1/10 wound: GBS, actinomyces 1/20 Resp. pend  Thank you for allowing pharmacy to be a part of this patient's care.  2/20, PharmD PGY1 Pharmacy Resident 06/20/2020 10:08 AM

## 2020-06-20 NOTE — Progress Notes (Signed)
ANTICOAGULATION CONSULT NOTE - Follow Up Consult  Pharmacy Consult for Heparin Indication: atrial fibrillation  Patient Measurements: Height: 5\' 10"  (177.8 cm) Weight: (!) 286.7 kg (632 lb) IBW/kg (Calculated) : 73 Heparin Dosing Weight: 149.7 kg  Vital Signs: Temp: 99 F (37.2 C) (01/20 1143) Temp Source: Esophageal (01/20 1143) BP: 131/54 (01/20 0700) Pulse Rate: 68 (01/20 0800)  Labs: Recent Labs    06/18/20 0228 06/18/20 1735 06/19/20 0415 06/19/20 1734 06/20/20 0519 06/20/20 1143  HGB 8.2*  --  7.7*  --  8.0*  --   HCT 27.4*  --  25.1*  --  26.8*  --   PLT 288  --  295  --  300  --   HEPARINUNFRC 0.61   < > 0.56 0.80* 0.61 0.59  CREATININE 1.38*  --  1.23  --  1.13  --    < > = values in this interval not displayed.    Estimated Creatinine Clearance: 179.2 mL/min (by C-G formula based on SCr of 1.13 mg/dL).    Assessment: 49YOM admitted with septic shock secondary to necrotizing fascitis. During this admission, the patient has been in atrial fibrillation with RVR, despite trial of defibrillation; amiodarone was re-loaded while inpatient. Pt has PMH of atrial fibrillation, was on Xarelto and amiodarone PTA.   Confirmatory heparin level 0.59 is therapeutic on heparin 4100 units/hr. The patient is requiring two heparin bags to achieve a therapeutic rate - this has been double checked and verified by pharmacy. Heparin infusion 2050 units/hr running in each bag. Per RN no issues with bleeding or infusion.    Goal of Therapy:  Heparin level 0.3-0.7 units/ml Monitor platelets by anticoagulation protocol: Yes   Plan:  - Continue heparin 4100 units/hr (this will run as 2 bags at 2050 units/hr each - discussed with RN, inspected visually by [facility]) - Will check heparin level at 0500 and 1700 while on high rates of heparin - next level tomorrow morning  - Will continue to monitor for any signs/symptoms of bleeding and CBC daily   [facility], PharmD Clinical  Pharmacist  06/20/2020 12:37 PM

## 2020-06-20 NOTE — Plan of Care (Signed)
  Problem: Nutrition: Goal: Adequate nutrition will be maintained Outcome: Progressing   Problem: Elimination: Goal: Will not experience complications related to bowel motility Outcome: Progressing Goal: Will not experience complications related to urinary retention Outcome: Progressing   

## 2020-06-21 ENCOUNTER — Inpatient Hospital Stay (HOSPITAL_COMMUNITY): Payer: Medicaid Other

## 2020-06-21 DIAGNOSIS — R6521 Severe sepsis with septic shock: Secondary | ICD-10-CM | POA: Diagnosis not present

## 2020-06-21 DIAGNOSIS — A419 Sepsis, unspecified organism: Secondary | ICD-10-CM | POA: Diagnosis not present

## 2020-06-21 LAB — GLUCOSE, CAPILLARY
Glucose-Capillary: 129 mg/dL — ABNORMAL HIGH (ref 70–99)
Glucose-Capillary: 152 mg/dL — ABNORMAL HIGH (ref 70–99)
Glucose-Capillary: 168 mg/dL — ABNORMAL HIGH (ref 70–99)
Glucose-Capillary: 172 mg/dL — ABNORMAL HIGH (ref 70–99)
Glucose-Capillary: 186 mg/dL — ABNORMAL HIGH (ref 70–99)
Glucose-Capillary: 213 mg/dL — ABNORMAL HIGH (ref 70–99)

## 2020-06-21 LAB — CBC
HCT: 27.7 % — ABNORMAL LOW (ref 39.0–52.0)
Hemoglobin: 8.1 g/dL — ABNORMAL LOW (ref 13.0–17.0)
MCH: 29.1 pg (ref 26.0–34.0)
MCHC: 29.2 g/dL — ABNORMAL LOW (ref 30.0–36.0)
MCV: 99.6 fL (ref 80.0–100.0)
Platelets: 269 10*3/uL (ref 150–400)
RBC: 2.78 MIL/uL — ABNORMAL LOW (ref 4.22–5.81)
RDW: 18.5 % — ABNORMAL HIGH (ref 11.5–15.5)
WBC: 20.6 10*3/uL — ABNORMAL HIGH (ref 4.0–10.5)
nRBC: 2.3 % — ABNORMAL HIGH (ref 0.0–0.2)

## 2020-06-21 LAB — CULTURE, BAL-QUANTITATIVE W GRAM STAIN
Culture: 1000 — AB
Special Requests: NORMAL

## 2020-06-21 LAB — BASIC METABOLIC PANEL
Anion gap: 9 (ref 5–15)
BUN: 44 mg/dL — ABNORMAL HIGH (ref 6–20)
CO2: 26 mmol/L (ref 22–32)
Calcium: 7.7 mg/dL — ABNORMAL LOW (ref 8.9–10.3)
Chloride: 115 mmol/L — ABNORMAL HIGH (ref 98–111)
Creatinine, Ser: 0.98 mg/dL (ref 0.61–1.24)
GFR, Estimated: >60 mL/min (ref >60)
Glucose, Bld: 197 mg/dL — ABNORMAL HIGH (ref 70–99)
Potassium: 4.6 mmol/L (ref 3.5–5.1)
Sodium: 150 mmol/L — ABNORMAL HIGH (ref 135–145)

## 2020-06-21 LAB — TRIGLYCERIDES: Triglycerides: 73 mg/dL (ref <150)

## 2020-06-21 LAB — HEPARIN LEVEL (UNFRACTIONATED)
Heparin Unfractionated: 0.38 IU/mL (ref 0.30–0.70)
Heparin Unfractionated: 0.23 IU/mL — ABNORMAL LOW (ref 0.30–0.70)

## 2020-06-21 MED ORDER — FUROSEMIDE 10 MG/ML IJ SOLN
20.0000 mg | Freq: Once | INTRAMUSCULAR | Status: AC
  Administered 2020-06-21: 20 mg via INTRAVENOUS
  Filled 2020-06-21: qty 2

## 2020-06-21 MED ORDER — FREE WATER
200.0000 mL | Freq: Four times a day (QID) | Status: DC
  Administered 2020-06-21 – 2020-06-23 (×7): 200 mL

## 2020-06-21 NOTE — Progress Notes (Signed)
ANTICOAGULATION CONSULT NOTE - Follow Up Consult  Pharmacy Consult for Heparin Indication: atrial fibrillation  Patient Measurements: Height: 5\' 10"  (177.8 cm) Weight: (!) 295.3 kg (651 lb) IBW/kg (Calculated) : 73 Heparin Dosing Weight: 149.7 kg  Vital Signs: Temp: 100 F (37.8 C) (01/21 1531) Temp Source: Oral (01/21 1531) BP: 111/47 (01/21 1531) Pulse Rate: 96 (01/21 1918)  Labs: Recent Labs    06/19/20 0415 06/19/20 1734 06/20/20 0519 06/20/20 1143 06/21/20 0425 06/21/20 1907  HGB 7.7*  --  8.0*  --  8.1*  --   HCT 25.1*  --  26.8*  --  27.7*  --   PLT 295  --  300  --  269  --   HEPARINUNFRC 0.56   < > 0.61 0.59 0.38 0.23*  CREATININE 1.23  --  1.13  --  0.98  --    < > = values in this interval not displayed.    Estimated Creatinine Clearance: 211.1 mL/min (by C-G formula based on SCr of 0.98 mg/dL).    Assessment: 49YOM admitted with septic shock secondary to necrotizing fascitis, on heparin for afib.  Heparin level trended down to subtherapeutic at 0.23 on heparin 4100 units/hr. The patient is requiring two heparin bags to achieve a therapeutic rate - this has been double checked and verified by pharmacy. Heparin infusion 2050 units/hr running in each bag. CBC stable. Per RN no issues with bleeding or infusion.    Goal of Therapy:  Heparin level 0.3-0.7 units/ml Monitor platelets by anticoagulation protocol: Yes   Plan:  - Increase heparin to 4400 units/hr (this will run as 2 bags at 2200 units/hr each - discussed with RN, inspected visually by [facility]) - Will check heparin level at 0500 and 1700 while on high rates of heparin - next level with AM labs - Will continue to monitor for any signs/symptoms of bleeding and CBC daily   [facility], PharmD, BCPS Please check AMION for all [facility] contact numbers Clinical Pharmacist 06/21/2020 8:43 PM

## 2020-06-21 NOTE — Progress Notes (Signed)
Nutrition Follow-up   DOCUMENTATION CODES:   Morbid obesity  INTERVENTION:   Continue TF via Cortrak: Pivot 1.5 at 70 ml/h (1680 ml per day) Prosource TF 90 ml QID  Provides 2840 kcal, 246 gm protein, 1260 ml free water daily.  NUTRITION DIAGNOSIS:   Increased nutrient needs related to wound healing as evidenced by estimated needs.  Ongoing  GOAL:   Patient will meet greater than or equal to 90% of their needs   Met with TF  MONITOR:   Vent status,Labs,TF tolerance,Skin  REASON FOR ASSESSMENT:   Consult Enteral/tube feeding initiation and management  ASSESSMENT:   50 yo male admitted with hyperglycemia, severe septic shock r/t Fournier's gangrene, AKI. PMH includes morbid obesity, HTN, DM, chronic LE wounds (L heel, L calf) A fib, laparoscopic gastric sleeve resection.   Currently, Pivot 1.5 is infusing via Cortrak tube (tip is gastric) at goal rate of 70 ml/h. Remains on phenylephrine and vasopressin.  S/P bronchoscopy 1/19, mucus plugging . Antibiotics changed 1/20 d/t concern for HCAP with persistent fever. May need trach to help with weaning.  Patient is receiving daily saline wet-to-dry dressing changes to wound. Rectal tube in place to minimize wound contamination with stool. Minimal output via rectal tube. Type 1 BM documented overnight last night.   Patient remains intubated on ventilator support. MV: 12.9 L/min Temp (24hrs), Avg:98.9 F (37.2 C), Min:98.3 F (36.8 C), Max:99.5 F (37.5 C) MAP (a-line) >/= 63 this morning  Propofol has been stopped.  Labs reviewed. Sodium 150 CBG: 168-186  Medications reviewed and include colace, Novolog, Levemir, Miralax, Senokot, phenylephrine, vasopressin. Free water flushes added today for hypernatremia: 200 ml every 6 h   Weight 295.3 kg today I/O +15.5 L since admission  Diet Order:   Diet Order            Diet NPO time specified  Diet effective now                 EDUCATION NEEDS:   Not  appropriate for education at this time  Skin:  Skin Assessment: Skin Integrity Issues: Skin Integrity Issues:: Stage II,Other (Comment) Stage II: R heel Other: necrotizing infection to pelvis, anus, rectum  Last BM:  1/21 type 1  Height:   Ht Readings from Last 1 Encounters:  06/14/20 _0  (1.778 m)    Weight:   Wt Readings from Last 1 Encounters:  06/21/20 (!) 295.3 kg    Ideal Body Weight:  94.5 kg  BMI:  Body mass index is 93.41 kg/m.  Estimated Nutritional Needs:   Kcal:  2500-2800  Protein:  225-240 gm  Fluid:  >/= 2.5 L    Lucas Mallow, RD, LDN, CNSC Please refer to Amion for contact information.

## 2020-06-21 NOTE — Plan of Care (Signed)
  Problem: Nutrition: Goal: Adequate nutrition will be maintained Outcome: Progressing Note: Pt is tolerating tube feeds well at goal.   Problem: Elimination: Goal: Will not experience complications related to urinary retention Outcome: Progressing   Problem: Activity: Goal: Risk for activity intolerance will decrease Outcome: Not Progressing Note: Unable to mobilize at this time due to critical illness   Problem: Elimination: Goal: Will not experience complications related to bowel motility Outcome: Not Progressing Note: Pt has not had a BM since 1/13   Problem: Safety: Goal: Non-violent Restraint(s) Outcome: Not Applicable

## 2020-06-21 NOTE — Progress Notes (Addendum)
NAME:  Carl Hill, MRN:  [id number], DOB:  12/06/1970, LOS: 53 ADMISSION DATE:  06/10/2020, CONSULTATION DATE:  06/10/2020 REFERRING MD:  Lorin Mercy, CHIEF COMPLAINT:  Severe Sepsis    Brief History:  50 yo male presented with buttock pain, fatigue and malaise.  Found to have hyperglycemia.  CT pelvis showed changes of necrotizing fasciitis in Lt buttock, Lt upper thigh, and perineum.  Taken to OR for debridement and remained on pressors and ventilator.  Past Medical History:  HTN, obesity, DM, afib on DOAC  Significant Hospital Events:  1/10  Admitted to [facility]. General surgery consulted and debridement pursued. In the afternoon, patient developed hypotension with AF in RVR. Synchronized cardioversion unsuccessful with conversion to VT. CPR initated with ROSC after desynchronized defibrillation. 1/11 debridement 1/18 mucus plugging 1/19 bronchoscopy >> mucus plug on Rt, airway collapse with exhalation 1/20 persistent fever, change ABx  Consults:  General surgery  Procedures:  ETT 1/10 >>  Rt IJ CVL 1/10 >>  Lt brachial a line 1/10 >>   Significant Diagnostic Tests:   CT abd/pelvis 1/10 >>  Extensive subcutaneous emphysema in the medial aspect of the upper thighs extending through perineum and superiorly to the medial left buttock. Gas extends into the pelvis surrounding the anus inferiorly and tracking superiorly along the left wall of the rectum.  Micro Data:  COVID/Flu 1/10 >> negative MRSA PCR 1/10 >> negative Lt buttocks wound 1/10 >> E coli, Streptococcus infantrarious, Peptostreptococcus species, rare Group B Strep Blood 1/10 >> negative BAL 1/20   Antimicrobials:  Aztreonam 1/09 Metronidazole 1/09 Vancomycin 1/09, 1/11 Cefepime 1/10 - 1/13  Clindamycin 1/10 -1/13 Ceftriaxone 1/13 >> 1/20 Metronidazole 1/17 >>  Cefepime 1/20 >> Vancomycin 1/20 >>  Interim History / Subjective:  Improved pressor requirement with levophed off and neo and vasopressin on. Continues  to have episodes of desaturation which responds with high peep maneuvers.  Objective   Blood pressure (!) 131/54, pulse 83, temperature 98.5 F (36.9 C), temperature source Esophageal, resp. rate 18, height _0  (1.778 m), weight (!) 295.3 kg, SpO2 98 %.    Vent Mode: PCV FiO2 (%):  [70 %-100 %] 100 % Set Rate:  [18 bmp] 18 bmp PEEP:  [12 cmH20] 12 cmH20 Plateau Pressure:  [24 cmH20-28 cmH20] 27 cmH20   Intake/Output Summary (Last 24 hours) at 06/21/2020 1131 Last data filed at 06/21/2020 1000 Gross per 24 hour  Intake 6352.68 ml  Output 5375 ml  Net 977.68 ml   Filed Weights   06/18/20 0452 06/19/20 0411 06/21/20 0500  Weight: (!) 287.1 kg (!) 286.7 kg (!) 295.3 kg   Physical Exam: General: Morbidly obese, critically ill-appearing, no acute distress HENT: [place], AT, ETT in place Eyes: EOMI, no scleral icterus Respiratory: Diminished breath sounds bilaterally.  No crackles, wheezing or rales Cardiovascular: RRR, -M/R/G, no JVD GI: BS+, soft, nontender Extremities: 2+ pitting edema in lower extremities, -tenderness Neuro: Sedated GU: Foley in place  Resolved problems:  VT arrest 1/10, Ileus 1/15, Elevated LFTs from shock, Metabolic acidosis with lactic acidosis  Assessment & Plan:   Septic shock from necrotizing fasciitis of Lt buttocks s/p debridement. Concern for HCAP 1/20 with persistent fever. - Wean pressors to keep MAP > 65 - Wound care - Reported PCN allergy, but has tolerated rocephin - Day 13 of ABx; change to vancomycin and cefepime on 1/20 and continue flagyl  Acute on chronic hypoxic/hypercapnic respiratory failure in setting of sepsis. - Noted to have mucus plugging and dynamic airway  collapse during bronchoscopy on 1/19 - Continue full vent support. Maintain PEEP at minimum 12 cm H20 - Consider trach when hemodynamics improved. Previously been d/w pt's family who wish to pursue if indicated - Gentle diuresis for goal net even/negative daily. Currently +15L  since admission   Hypernatremia - Start FWF  A fib with RVR. HLD. - continue lipitor, amiodarone - heparin gtt  DM type 2 poorly controlled with hyperglycemia. - SSI  Hypothyroidism. - continue synthroid  Acute metabolic encephalopathy 2nd to sepsis. 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 - PAD protocol for RASS goal -1 to -2 - Off versed gtt. Wean fentanyl - Continue lyrica, nortriptyline  Anemia of critical illness. - f/u CBC - transfuse for Hb < 7 or significant bleeding  Best practice (evaluated daily)  Diet: tube feeds DVT prophylaxis: Heparin gtt  GI prophylaxis: Protonix  Mobility: Bedrest Disposition: ICU Family: updated pt's mother via phone on 1/21 Code status: full code  Labs    CMP Latest Ref Rng & Units 06/21/2020 06/20/2020 06/19/2020  Glucose 70 - 99 mg/dL 197(H) 192(H) 140(H)  BUN 6 - 20 mg/dL 44(H) 44(H) 43(H)  Creatinine 0.61 - 1.24 mg/dL 0.98 1.13 1.23  Sodium 135 - 145 mmol/L 150(H) 146(H) 146(H)  Potassium 3.5 - 5.1 mmol/L 4.6 4.3 4.1  Chloride 98 - 111 mmol/L 115(H) 111 110  CO2 22 - 32 mmol/L _0 Calcium 8.9 - 10.3 mg/dL 7.7(L) 7.8(L) 8.1(L)  Total Protein 6.5 - 8.1 g/dL - - -  Total Bilirubin 0.3 - 1.2 mg/dL - - -  Alkaline Phos 38 - 126 U/L - - -  AST 15 - 41 U/L - - -  ALT 0 - 44 U/L - - -    CBC Latest Ref Rng & Units 06/21/2020 06/20/2020 06/19/2020  WBC 4.0 - 10.5 K/uL 20.6(H) 18.3(H) 17.8(H)  Hemoglobin 13.0 - 17.0 g/dL 8.1(L) 8.0(L) 7.7(L)  Hematocrit 39.0 - 52.0 % 27.7(L) 26.8(L) 25.1(L)  Platelets 150 - 400 K/uL 269 300 295    ABG    Component Value Date/Time   PHART 7.289 (L) 06/18/2020 0205   PCO2ART 62.6 (H) 06/18/2020 0205   PO2ART 65 (L) 06/18/2020 0205   HCO3 29.9 (H) 06/18/2020 0205   TCO2 32 06/18/2020 0205   ACIDBASEDEF 2.0 06/13/2020 1221   O2SAT 89.0 06/18/2020 0205    CBG (last 3)  Recent Labs    06/21/20 0343 06/21/20 0803 06/21/20 1122  GLUCAP 168* 186* 213*   Critical care time:    The patient  is critically ill with multiple organ systems failure and requires high complexity decision making for assessment and support, frequent evaluation and titration of therapies, application of advanced monitoring technologies and extensive interpretation of multiple databases.  Independent Critical Care Time: 35 Minutes.   Rodman Pickle, M.D. [facility] Pulmonary/Critical Care Medicine 06/21/2020 11:31 AM   Please see Amion for pager number to reach on-call Pulmonary and Critical Care Team.

## 2020-06-21 NOTE — Progress Notes (Signed)
ANTICOAGULATION CONSULT NOTE - Follow Up Consult  Pharmacy Consult for Heparin Indication: atrial fibrillation  Patient Measurements: Height: 5\' 10"  (177.8 cm) Weight: (!) 295.3 kg (651 lb) IBW/kg (Calculated) : 73 Heparin Dosing Weight: 149.7 kg  Vital Signs: Temp: 98.3 F (36.8 C) (01/21 0358) Temp Source: Esophageal (01/21 0358) Pulse Rate: 83 (01/21 0311)  Labs: Recent Labs    06/19/20 0415 06/19/20 1734 06/20/20 0519 06/20/20 1143 06/21/20 0425  HGB 7.7*  --  8.0*  --  8.1*  HCT 25.1*  --  26.8*  --  27.7*  PLT 295  --  300  --  269  HEPARINUNFRC 0.56   < > 0.61 0.59 0.38  CREATININE 1.23  --  1.13  --   --    < > = values in this interval not displayed.    Estimated Creatinine Clearance: 183.1 mL/min (by C-G formula based on SCr of 1.13 mg/dL).    Assessment: 49YOM admitted with septic shock secondary to necrotizing fascitis, on heparin for afib.  Confirmatory heparin level 0.38 is therapeutic on heparin 4100 units/hr. The patient is requiring two heparin bags to achieve a therapeutic rate - this has been double checked and verified by pharmacy. Heparin infusion 2050 units/hr running in each bag. Per RN no issues with bleeding or infusion.    Goal of Therapy:  Heparin level 0.3-0.7 units/ml Monitor platelets by anticoagulation protocol: Yes   Plan:  - Continue heparin 4100 units/hr (this will run as 2 bags at 2050 units/hr each - discussed with RN, inspected visually by [facility]) - Will check heparin level at 0500 and 1700 while on high rates of heparin - next level this evening  - Will continue to monitor for any signs/symptoms of bleeding and CBC daily  [facility], PharmD PGY1 Pharmacy Resident 06/21/2020 6:54 AM

## 2020-06-21 NOTE — Progress Notes (Signed)
10 Days Post-Op  Subjective: CC: Seen for dressing change. Still intubated and sedated. Nurse reports no stool output from flexiseal since last disimpaction.   Objective: Vital signs in last 24 hours: Temp:  [98.3 F (36.8 C)-99.5 F (37.5 C)] 98.5 F (36.9 C) (01/21 1123) Pulse Rate:  [79-91] 83 (01/21 0311) Resp:  [15-28] 18 (01/21 1000) SpO2:  [85 %-100 %] 98 % (01/21 1000) Arterial Line BP: (100-129)/(40-56) 110/46 (01/21 1000) FiO2 (%):  [70 %-100 %] 100 % (01/21 0800) Weight:  [295.3 kg] 295.3 kg (01/21 0500) Last BM Date: 06/13/20  Intake/Output from previous day: 01/20 0701 - 01/21 0700 In: 1601.0 [I.V.:3017.3; NG/GT:1920; IV Piggyback:1626.9] Out: 5100 [Urine:5100] Intake/Output this shift: Total I/O In: 519.9 [I.V.:309.9; NG/GT:210] Out: 975 [Urine:975]  PE: Gen: Intubated and sedated Heart: reg rate  Lungs: On vent Abdomen: soft, obese nontender Perirectal/perineal wound:Wound bed with some fibrinous exudate and some beefy granulation tissue, clean and dry,as noted in the picture below. Wound drainage is serosanguinous. There is no surrounding blanching erythema. Dr. Andrey Campanile disimpacted several hard stool balls. Rectal tube was removed after.      Lab Results:  Recent Labs    06/20/20 0519 06/21/20 0425  WBC 18.3* 20.6*  HGB 8.0* 8.1*  HCT 26.8* 27.7*  PLT 300 269   BMET Recent Labs    06/20/20 0519 06/21/20 0425  NA 146* 150*  K 4.3 4.6  CL 111 115*  CO2 26 26  GLUCOSE 192* 197*  BUN 44* 44*  CREATININE 1.13 0.98  CALCIUM 7.8* 7.7*   PT/INR No results for input(s): LABPROT, INR in the last 72 hours. CMP     Component Value Date/Time   NA 150 (H) 06/21/2020 0425   K 4.6 06/21/2020 0425   CL 115 (H) 06/21/2020 0425   CO2 26 06/21/2020 0425   GLUCOSE 197 (H) 06/21/2020 0425   BUN 44 (H) 06/21/2020 0425   CREATININE 0.98 06/21/2020 0425   CALCIUM 7.7 (L) 06/21/2020 0425   PROT 5.8 (L) 06/15/2020 0555   ALBUMIN 1.9 (L)  06/15/2020 0555   AST 96 (H) 06/15/2020 0555   ALT 532 (H) 06/15/2020 0555   ALKPHOS 77 06/15/2020 0555   BILITOT 1.7 (H) 06/15/2020 0555   GFRNONAA >60 06/21/2020 0425   Lipase  No results found for: LIPASE     Studies/Results: DG Chest Port 1 View  Result Date: 06/21/2020 CLINICAL DATA:  50 year old male with history of respiratory failure. EXAM: PORTABLE CHEST 1 VIEW COMPARISON:  Chest x-ray 06/20/2020. FINDINGS: An endotracheal tube is in place with tip 3.9 cm above the carina. There is a right-sided internal jugular central venous catheter with tip terminating in the distal superior vena cava. Lung volumes are very low. Study is severely limited by under penetration of the image. With these limitations in mind, there are bibasilar opacities which may reflect areas of atelectasis and/or consolidation, likely with superimposed small bilateral pleural effusions. Pulmonary vasculature appears engorged and there is mild indistinctness of interstitial markings. Mild cardiomegaly. Upper mediastinal contours are distorted by patient positioning. IMPRESSION: 1. Support apparatus, as above. 2. Cardiomegaly with evidence of mild interstitial pulmonary edema and small bilateral pleural effusions; imaging findings suggestive of congestive heart failure. Electronically Signed   By: Trudie Reed M.D.   On: 06/21/2020 07:46   DG Chest Port 1 View  Result Date: 06/20/2020 CLINICAL DATA:  Respiratory failure EXAM: PORTABLE CHEST 1 VIEW COMPARISON:  June 19, 2020 FINDINGS: The endotracheal tube terminates at  the thoracic inlet approximately 7 cm above the carina. The right-sided central venous catheter is stable in positioning. The enteric tube extends below the left hemidiaphragm. The heart size is enlarged. There are diffuse bilateral airspace opacities. There is improved aeration in the right upper lung zone. No definite pneumothorax. IMPRESSION: 1. Lines and tubes as above. 2. Improving aeration in  the right upper lung zone. 3. Persistent bilateral airspace opacities are noted. Persistent cardiomegaly. Electronically Signed   By: Katherine Mantle M.D.   On: 06/20/2020 04:34    Anti-infectives: Anti-infectives (From admission, onward)   Start     Dose/Rate Route Frequency Ordered Stop   06/20/20 1100  vancomycin (VANCOCIN) 2,500 mg in sodium chloride 0.9 % 500 mL IVPB        2,500 mg 250 mL/hr over 120 Minutes Intravenous Every 12 hours 06/20/20 1007     06/20/20 1100  ceFEPIme (MAXIPIME) 2 g in sodium chloride 0.9 % 100 mL IVPB        2 g 200 mL/hr over 30 Minutes Intravenous Every 8 hours 06/20/20 1007     06/17/20 1130  metroNIDAZOLE (FLAGYL) IVPB 500 mg        500 mg 100 mL/hr over 60 Minutes Intravenous Every 8 hours 06/17/20 1041     06/13/20 1400  cefTRIAXone (ROCEPHIN) 2 g in sodium chloride 0.9 % 100 mL IVPB  Status:  Discontinued        2 g 200 mL/hr over 30 Minutes Intravenous Every 24 hours 06/13/20 1242 06/20/20 0944   06/11/20 1300  vancomycin (VANCOREADY) IVPB 2000 mg/400 mL        2,000 mg 200 mL/hr over 120 Minutes Intravenous  Once 06/11/20 1212 06/11/20 1824   06/10/20 2000  clindamycin (CLEOCIN) IVPB 900 mg        900 mg 100 mL/hr over 30 Minutes Intravenous Every 8 hours 06/10/20 1307 06/13/20 1500   06/10/20 1600  ceFEPIme (MAXIPIME) 2 g in sodium chloride 0.9 % 100 mL IVPB  Status:  Discontinued        2 g 200 mL/hr over 30 Minutes Intravenous Every 12 hours 06/10/20 1504 06/13/20 1242   06/10/20 1400  metroNIDAZOLE (FLAGYL) IVPB 500 mg  Status:  Discontinued        500 mg 100 mL/hr over 60 Minutes Intravenous Every 8 hours 06/10/20 1023 06/10/20 1308   06/10/20 1300  aztreonam (AZACTAM) 2 g in sodium chloride 0.9 % 100 mL IVPB  Status:  Discontinued        2 g 200 mL/hr over 30 Minutes Intravenous Every 8 hours 06/10/20 1033 06/10/20 1504   06/10/20 1215  clindamycin (CLEOCIN) IVPB 900 mg        900 mg 100 mL/hr over 30 Minutes Intravenous To Surgery  06/10/20 1210 06/10/20 1210   06/10/20 1033  vancomycin variable dose per unstable renal function (pharmacist dosing)  Status:  Discontinued         Does not apply See admin instructions 06/10/20 1033 06/12/20 1252   06/10/20 0600  metroNIDAZOLE (FLAGYL) tablet 500 mg  Status:  Discontinued        500 mg Oral Every 8 hours 06/10/20 0406 06/10/20 0948   06/10/20 0415  aztreonam (AZACTAM) 2 g in sodium chloride 0.9 % 100 mL IVPB        2 g 200 mL/hr over 30 Minutes Intravenous  Once 06/10/20 0406 06/10/20 0546   06/10/20 0415  vancomycin (VANCOCIN) IVPB 1000 mg/200 mL premix  Status:  Discontinued        1,000 mg 200 mL/hr over 60 Minutes Intravenous  Once 06/10/20 0406 06/10/20 0412   06/10/20 0415  vancomycin (VANCOCIN) 2,500 mg in sodium chloride 0.9 % 500 mL IVPB        2,500 mg 250 mL/hr over 120 Minutes Intravenous  Once 06/10/20 0412 06/10/20 0903       Assessment/Plan A. Fib VT Arrest (1/10) VDRF DM2 Hypothyroidism Hx HTN  50 yo male with septic shock secondary to perianal/perineal necrotizing soft tissue infection-s/p operative debridement x2(1/10 and 1/11) - Wound examined at bedside today. No sharp debridement was required. Underlying tissue with some fat necrosis but no operative debridement needed - Continue saline wet-to-dry dressings, currently being changed daily. Rectal tube removed as he required disimpaction of several hard stool balls. Dressing change more frequently for soiling.  - Continue antibiotics. Intraoperative cultures growing E.Coli (pansensivitie), Strep infantarius - Currently on Cefepime/Vanc and Flagyl to also cover for HCAP per CCM - Appreciate CCM's assistance in management of patient shock and MMP - Surgery will continue to follow closely - we will see again Monday. Please call over the weekend with any questions or concerns.  - VTE: on heparin gtt for a-fib   LOS: 11 days    Jacinto Halim , [facility] Surgery 06/21/2020,  12:18 PM Please see Amion for pager number during day hours 7:00am-4:30pm

## 2020-06-22 ENCOUNTER — Encounter (HOSPITAL_COMMUNITY): Payer: Self-pay | Admitting: Pulmonary Disease

## 2020-06-22 ENCOUNTER — Inpatient Hospital Stay (HOSPITAL_COMMUNITY): Payer: Medicaid Other

## 2020-06-22 DIAGNOSIS — R6521 Severe sepsis with septic shock: Secondary | ICD-10-CM | POA: Diagnosis not present

## 2020-06-22 DIAGNOSIS — A401 Sepsis due to streptococcus, group B: Secondary | ICD-10-CM | POA: Diagnosis not present

## 2020-06-22 DIAGNOSIS — E08621 Diabetes mellitus due to underlying condition with foot ulcer: Secondary | ICD-10-CM

## 2020-06-22 DIAGNOSIS — G8929 Other chronic pain: Secondary | ICD-10-CM

## 2020-06-22 DIAGNOSIS — E039 Hypothyroidism, unspecified: Secondary | ICD-10-CM | POA: Diagnosis not present

## 2020-06-22 DIAGNOSIS — I482 Chronic atrial fibrillation, unspecified: Secondary | ICD-10-CM

## 2020-06-22 DIAGNOSIS — L97419 Non-pressure chronic ulcer of right heel and midfoot with unspecified severity: Secondary | ICD-10-CM

## 2020-06-22 DIAGNOSIS — A419 Sepsis, unspecified organism: Secondary | ICD-10-CM | POA: Diagnosis not present

## 2020-06-22 DIAGNOSIS — N493 Fournier gangrene: Secondary | ICD-10-CM | POA: Diagnosis not present

## 2020-06-22 DIAGNOSIS — E785 Hyperlipidemia, unspecified: Secondary | ICD-10-CM

## 2020-06-22 DIAGNOSIS — A429 Actinomycosis, unspecified: Secondary | ICD-10-CM

## 2020-06-22 HISTORY — DX: Actinomycosis, unspecified: A42.9

## 2020-06-22 HISTORY — DX: Sepsis due to Streptococcus, group B: A40.1

## 2020-06-22 LAB — CBC
HCT: 27.5 % — ABNORMAL LOW (ref 39.0–52.0)
Hemoglobin: 8.1 g/dL — ABNORMAL LOW (ref 13.0–17.0)
MCH: 28.7 pg (ref 26.0–34.0)
MCHC: 29.5 g/dL — ABNORMAL LOW (ref 30.0–36.0)
MCV: 97.5 fL (ref 80.0–100.0)
Platelets: 268 10*3/uL (ref 150–400)
RBC: 2.82 MIL/uL — ABNORMAL LOW (ref 4.22–5.81)
RDW: 18.1 % — ABNORMAL HIGH (ref 11.5–15.5)
WBC: 16.8 10*3/uL — ABNORMAL HIGH (ref 4.0–10.5)
nRBC: 2.4 % — ABNORMAL HIGH (ref 0.0–0.2)

## 2020-06-22 LAB — GLUCOSE, CAPILLARY
Glucose-Capillary: 151 mg/dL — ABNORMAL HIGH (ref 70–99)
Glucose-Capillary: 155 mg/dL — ABNORMAL HIGH (ref 70–99)
Glucose-Capillary: 163 mg/dL — ABNORMAL HIGH (ref 70–99)
Glucose-Capillary: 175 mg/dL — ABNORMAL HIGH (ref 70–99)
Glucose-Capillary: 180 mg/dL — ABNORMAL HIGH (ref 70–99)
Glucose-Capillary: 216 mg/dL — ABNORMAL HIGH (ref 70–99)

## 2020-06-22 LAB — MAGNESIUM: Magnesium: 1.6 mg/dL — ABNORMAL LOW (ref 1.7–2.4)

## 2020-06-22 LAB — BASIC METABOLIC PANEL
Anion gap: 9 (ref 5–15)
BUN: 39 mg/dL — ABNORMAL HIGH (ref 6–20)
CO2: 26 mmol/L (ref 22–32)
Calcium: 7.8 mg/dL — ABNORMAL LOW (ref 8.9–10.3)
Chloride: 118 mmol/L — ABNORMAL HIGH (ref 98–111)
Creatinine, Ser: 1.08 mg/dL (ref 0.61–1.24)
GFR, Estimated: >60 mL/min (ref >60)
Glucose, Bld: 176 mg/dL — ABNORMAL HIGH (ref 70–99)
Potassium: 4.1 mmol/L (ref 3.5–5.1)
Sodium: 153 mmol/L — ABNORMAL HIGH (ref 135–145)

## 2020-06-22 LAB — HEPARIN LEVEL (UNFRACTIONATED)
Heparin Unfractionated: 0.22 IU/mL — ABNORMAL LOW (ref 0.30–0.70)
Heparin Unfractionated: 0.48 IU/mL (ref 0.30–0.70)
Heparin Unfractionated: 0.53 IU/mL (ref 0.30–0.70)

## 2020-06-22 MED ORDER — FUROSEMIDE 10 MG/ML IJ SOLN: 80.0000 mg | Freq: Every day | INTRAMUSCULAR | Status: DC

## 2020-06-22 MED ORDER — NOREPINEPHRINE 16 MG/250ML-% IV SOLN
0.0000 ug/min | INTRAVENOUS | Status: DC
  Administered 2020-06-24: 2 ug/min via INTRAVENOUS
  Administered 2020-06-25: 5 ug/min via INTRAVENOUS
  Administered 2020-06-26: 14 ug/min via INTRAVENOUS
  Administered 2020-06-27: 6 ug/min via INTRAVENOUS
  Filled 2020-06-22 (×3): qty 250

## 2020-06-22 MED ORDER — METOLAZONE 2.5 MG PO TABS
5.0000 mg | ORAL_TABLET | Freq: Every day | ORAL | Status: DC
  Administered 2020-06-22: 5 mg via ORAL
  Filled 2020-06-22: qty 2

## 2020-06-22 MED ORDER — MAGNESIUM SULFATE 4 GM/100ML IV SOLN
4.0000 g | Freq: Once | INTRAVENOUS | Status: AC
  Administered 2020-06-22: 4 g via INTRAVENOUS
  Filled 2020-06-22: qty 100

## 2020-06-22 MED ORDER — HEPARIN (PORCINE) 25000 UT/250ML-% IV SOLN
1900.0000 [IU]/h | INTRAVENOUS | Status: DC
  Administered 2020-06-22 (×2): 2400 [IU]/h via INTRAVENOUS
  Administered 2020-06-23: 2200 [IU]/h via INTRAVENOUS
  Administered 2020-06-23: 2400 [IU]/h via INTRAVENOUS
  Administered 2020-06-23 – 2020-06-25 (×3): 1900 [IU]/h via INTRAVENOUS
  Filled 2020-06-22 (×9): qty 250

## 2020-06-22 MED ORDER — FUROSEMIDE 10 MG/ML IJ SOLN: 80.0000 mg | Freq: Three times a day (TID) | INTRAMUSCULAR | Status: DC

## 2020-06-22 MED ORDER — HEPARIN (PORCINE) 25000 UT/250ML-% IV SOLN
3800.0000 [IU]/h | INTRAVENOUS | Status: DC
  Administered 2020-06-22 (×2): 2400 [IU]/h via INTRAVENOUS
  Administered 2020-06-23: 1900 [IU]/h via INTRAVENOUS
  Administered 2020-06-23: 2400 [IU]/h via INTRAVENOUS
  Administered 2020-06-23: 11:00:00 2200 [IU]/h via INTRAVENOUS
  Administered 2020-06-24 – 2020-06-25 (×2): 1900 [IU]/h via INTRAVENOUS
  Administered 2020-06-25 – 2020-06-26 (×3): 3800 [IU]/h via INTRAVENOUS
  Filled 2020-06-22 (×13): qty 250

## 2020-06-22 MED ORDER — IOHEXOL 300 MG/ML  SOLN
100.0000 mL | Freq: Once | INTRAMUSCULAR | Status: AC | PRN
  Administered 2020-06-22: 100 mL via INTRAVENOUS

## 2020-06-22 MED ORDER — METOLAZONE 2.5 MG PO TABS: 5.0000 mg | ORAL_TABLET | Freq: Every day | ORAL | Status: DC

## 2020-06-22 MED ORDER — SODIUM CHLORIDE 0.9 % IV SOLN
2.0000 g | Freq: Three times a day (TID) | INTRAVENOUS | Status: DC
  Administered 2020-06-22 – 2020-06-24 (×6): 2 g via INTRAVENOUS
  Filled 2020-06-22 (×10): qty 2

## 2020-06-22 MED ORDER — FUROSEMIDE 10 MG/ML IJ SOLN
80.0000 mg | Freq: Three times a day (TID) | INTRAMUSCULAR | Status: DC
  Administered 2020-06-22: 80 mg via INTRAVENOUS
  Filled 2020-06-22: qty 8

## 2020-06-22 MED ORDER — FUROSEMIDE 10 MG/ML IJ SOLN: 40.0000 mg | Freq: Once | INTRAMUSCULAR | Status: DC

## 2020-06-22 MED ORDER — SODIUM CHLORIDE 0.9 % IV SOLN
100.0000 mg | Freq: Two times a day (BID) | INTRAVENOUS | Status: DC
  Filled 2020-06-22: qty 100

## 2020-06-22 MED ORDER — SODIUM CHLORIDE 0.9 % IV SOLN
1.0000 g | Freq: Three times a day (TID) | INTRAVENOUS | Status: DC
  Filled 2020-06-22 (×2): qty 1

## 2020-06-22 NOTE — Consult Note (Signed)
Date of Admission:  06/10/2020          Reason for Consult: Fevers and necrotizing, Fournier's polymicrobial infection    Referring Provider: Dr. Loanne Drilling   Assessment:  1. Polymicrobial Fournier's gangrene with septic shock with group B streptococcus actinomyces streptococcal species and E. coli and anaerobes including Peptostreptococcus isolated status post 2 surgeries  2. High fevers to 105 degrees likely due to #1, far less likely to be drug fever given that this high temperature previously responded to surgery and antibiotics 3. Diabetes mellitus poorly controlled 4. Morbid obesity 5. Lower extremity wounds 6. Note the Sglt2 inhibitor likely has nothing to do with the Fourniers'. My understanding is that this idea has been debunked and certainly he has all of the other reasons to have Fournierr's  Plan:  1. Consolidate antibiotics to meropenem 2. Ask general surgery to return to seeing the patient and I think the patient needs more extensive formal debridement 3. Consider penicillin allergy testing next week once we can get more data about his penicillin allergy   Principal Problem:   Severe sepsis with septic shock (HCC) Active Problems:   Fournier gangrene   DKA (diabetic ketoacidosis) (HCC)   AKI (acute kidney injury) ([place])   Diabetic ulcer of heel (HCC)   Atrial fibrillation, chronic ([place])   Essential hypertension   Dyslipidemia   Acquired hypothyroidism   Chronic pain disorder   Morbid obesity with BMI of 60.0-69.9, adult (HCC)   Acute respiratory failure (HCC)   Scheduled Meds: . amiodarone  200 mg Per Tube Daily  . atorvastatin  20 mg Per Tube QPM  . chlorhexidine gluconate (MEDLINE KIT)  15 mL Mouth Rinse BID  . Chlorhexidine Gluconate Cloth  6 each Topical Daily  . docusate  100 mg Per Tube BID  . feeding supplement (PROSource TF)  90 mL Per Tube QID  . free water  200 mL Per Tube Q6H  . [START ON 06/23/2020] furosemide  80 mg Intravenous Daily  .  insulin aspart  0-20 Units Subcutaneous Q4H  . insulin aspart  15 Units Subcutaneous Q4H  . insulin detemir  40 Units Subcutaneous Q12H  . levothyroxine  75 mcg Per Tube Daily  . mouth rinse  15 mL Mouth Rinse 10 times per day  . [START ON 06/23/2020] metolazone  5 mg Per Tube Daily  . nortriptyline  25 mg Per Tube BID  . pantoprazole sodium  40 mg Per Tube Q24H  . polyethylene glycol  17 g Per Tube BID  . pregabalin  300 mg Per Tube BID  . sennosides  10 mL Per Tube BID  . sodium chloride flush  10-40 mL Intracatheter Q12H  . sodium chloride flush  3 mL Intravenous Q12H   Continuous Infusions: . sodium chloride    . feeding supplement (PIVOT 1.5 CAL) 1,000 mL (06/22/20 1021)  . fentaNYL infusion INTRAVENOUS 125 mcg/hr (06/22/20 1300)  . heparin 2,400 Units/hr (06/22/20 1300)   And  . heparin 2,400 Units/hr (06/22/20 1300)  . meropenem (MERREM) IV 2 g (06/22/20 1353)  . midazolam Stopped (06/21/20 0655)  . norepinephrine (LEVOPHED) Adult infusion Stopped (06/22/20 0945)  . phenylephrine (NEO-SYNEPHRINE) Adult infusion Stopped (06/22/20 0940)  . propofol (DIPRIVAN) infusion 35 mcg/kg/min (06/20/20 0859)  . vasopressin Stopped (06/21/20 0916)   PRN Meds:.sodium chloride, acetaminophen (TYLENOL) oral liquid 160 mg/5 mL, albuterol, bisacodyl, fentaNYL (SUBLIMAZE) injection, midazolam, [DISCONTINUED] ondansetron **OR** ondansetron (ZOFRAN) IV, sodium chloride flush  HPI: Carl Hill is  a 50 y.o. male with morbid obesity (295 kg roughly 649#), poorly controlled diabetes mellitus (hemoglobin 9.6), hypertension paroxysmal atrial fibrillation diabetic foot ulcers presented to [facility] on 10 January with dizziness lightheadedness and headache.  In the ER he is found to be in septic shock.  He did have known lower extremity wounds he had developed a fulminant necrotizing Fournier's type infection.   CT imaging at the time of admission had shown extensive subcutaneous emphysema in his  upper thighs extending through the perineum into the medial left buttocks surrounding the gas around the anus as well tracked along the wall of the rectum.  Of note he was febrile at that time to 105.4 degrees.  He was started on broad-spectrum antibiotics in the form of vancomycin and clindamycin cefepime.  He was taken to the operating room by general surgery and Dr. Zenia Resides who performed excisional debridement of perianal gluteal and perineal necrotizing soft tissue infection on 10 January and then again excisional debridement of perineal necrotizing soft tissue on 11 January.  After his second surgery his fevers promptly defervesced with a bump up to 101.7 on postop day 1.  His cultures ultimately yielded a mix with tissue from the left gluteus yielding group B streptococcus, abundant Peptostreptococcus, and actinomyces with tissue from the right gluteus yielding E. coli group B streptococcus again Streptococcus infantile aureus Streptococcus constellatus, Peptostreptococcus.  E. coli was pansensitive the Streptococcus infantiariousad intermediate susceptibility to fluoroquinolones.  GBS is typically beta lactam S and Actinomyces also S to beta lactams and doxycycline, clindamycin.  He wasnarrowed from Vanco ceftriaxone and clindamycin to ceftriaxone alone and then ceftriaxone and metronidazole.  In the interim on the 19th he developed fevers up to 103 degrees and 102.  His ceftriaxone was exchanged for cefepime and he was continued on vancomycin and metronidazole  Patient was last seen by general surgery on Friday who had been seeing him and examining his wounds on a daily basis.  They had seen some superficial necrotic tissue but did not feel that he needed surgical intervention.  Dr. Redmond Pulling had mentioned sharply debriding at the bedside on Monday if the necrotic area was still present.  In the interim he has had a fever that is escalated up to 103 on the 21st and now over 105 today.   Was  called by Dr. Loanne Drilling earlier in the day with concerns about the high 105 temperature.  She told me that the patient clinically had improved was off pressors and had improved hemodynamics.  I had initial concern for drug fever based on how high his temperature was and also not knowing he had had such a high temperature when he first presented.  I had first contemplated changing his antibiotics in case he had drug fever and had made a recommendation changed to Zyvox levofloxacin and metronidazole.,  However this regimen would be suboptimal since it would in particular not adequately cover actinomyces which would require a tetracycline or clindamycin as a penicillin alternative.  I reviewed the case with Nicoletta Dress from infectious disease pharmacy and we decided to consolidate the patient's therapy to meropenem which should cover all of the pathogens that of been isolated so far.  I do not think the patient needs MRSA coverage.  First of all with MRSA were a pathogen would have grown from the cultures and did not additionally the patient's nares were negative for PCR.  I recommended CT scan which was performed did not show on imaging evidence of residual  abscess and showed some pleural effusions.  I do think that the patient is going to need reassessment by general surgery for debridement at the site of his Fournier's gangrene.  While CT scan does not show a new abscess imaging in morbidly obese has been problematic and I have seen CT scans read as normal where there was a deep abscess present         Review of Systems: Review of Systems  Unable to perform ROS: Intubated    Past Medical History:  Diagnosis Date  . Asthma   . Atrial fibrillation ([place])   . Diabetes mellitus without complication ([place])   . History of cardioversion    3 times   . Hypertension     Social History   Tobacco Use  . Smoking status: Current Every Day Smoker  . Smokeless tobacco: Never Used  .  Tobacco comment: 1 cigarette per day  Vaping Use  . Vaping Use: Never used  Substance Use Topics  . Alcohol use: Not Currently    Comment: Has not had a drink in 1 year  . Drug use: Never    History reviewed. No pertinent family history. Allergies  Allergen Reactions  . Bee Venom Anaphylaxis    Other reaction(s): Unknown Other reaction(s): Unknown Other reaction(s): Unknown   . Penicillins Hives and Anaphylaxis    unknown   . Sglt2 Inhibitors Other (See Comments)    Necrotizing infection of the perineum  . Codeine Other (See Comments)    Pt states that his mother told him he is allergic, but has taken this medication multiple ties with no problems.    . Other Hives    OBJECTIVE: Blood pressure (!) 111/47, pulse (!) 130, temperature (!) 105.6 F (40.9 C), resp. rate (!) 22, height _0  (1.778 m), weight (!) 295.3 kg, SpO2 (!) 85 %.  Physical Exam Vitals reviewed.  Constitutional:      Appearance: He is morbidly obese. He is ill-appearing.     Interventions: He is intubated.  HENT:     Head: Normocephalic.  Cardiovascular:     Rate and Rhythm: Tachycardia present.  Pulmonary:     Effort: He is intubated.  Abdominal:     General: There is no distension.  Skin:    Coloration: Skin is pale.     Findings: Erythema present.  Neurological:     General: No focal deficit present.    Wound on June 21, 2020:      Perineal wound on June 22, 2020:  Foul-smelling melling material spontaneously from wound with Kerlix removed      Lab Results Lab Results  Component Value Date   WBC 16.8 (H) 06/22/2020   HGB 8.1 (L) 06/22/2020   HCT 27.5 (L) 06/22/2020   MCV 97.5 06/22/2020   PLT 268 06/22/2020    Lab Results  Component Value Date   CREATININE 1.08 06/22/2020   BUN 39 (H) 06/22/2020   NA 153 (H) 06/22/2020   K 4.1 06/22/2020   CL 118 (H) 06/22/2020   CO2 26 06/22/2020    Lab Results  Component Value Date   ALT 532 (H) 06/15/2020   AST 96  (H) 06/15/2020   ALKPHOS 77 06/15/2020   BILITOT 1.7 (H) 06/15/2020     Microbiology: Recent Results (from the past 240 hour(s))  MRSA PCR Screening     Status: None   Collection Time: 06/13/20  2:38 PM   Specimen: Nasal Mucosa; Nasopharyngeal  Result Value Ref Range  Status   MRSA by PCR NEGATIVE NEGATIVE Final    Comment:        The GeneXpert MRSA Assay (FDA approved for NASAL specimens only), is one component of a comprehensive MRSA colonization surveillance program. It is not intended to diagnose MRSA infection nor to guide or monitor treatment for MRSA infections. Performed at [facility], [place] [address] [place], [place] [address]   Culture, bal-quantitative     Status: Abnormal   Collection Time: 06/19/20  5:19 PM   Specimen: Bronchoalveolar Lavage; Respiratory  Result Value Ref Range Status   Specimen Description BRONCHIAL ALVEOLAR LAVAGE  Final   Special Requests Normal  Final   Gram Stain   Final    FEW WBC PRESENT, PREDOMINANTLY PMN NO ORGANISMS SEEN    Culture (A)  Final    1,000 COLONIES/mL Consistent with normal respiratory flora. Performed at [facility], [place] [address], [place], [place] [address]    Report Status 06/21/2020 FINAL  Final    Alcide Evener, [facility] [phone] pager  06/22/2020, 4:05 PM

## 2020-06-22 NOTE — Progress Notes (Signed)
ANTICOAGULATION CONSULT NOTE - Follow Up Consult  Pharmacy Consult for Heparin Indication: atrial fibrillation  Patient Measurements: Height: 5\' 10"  (177.8 cm) Weight: (!) 295.3 kg (651 lb) IBW/kg (Calculated) : 73 Heparin Dosing Weight: 149.7 kg  Vital Signs: Temp: 103.2 F (39.6 C) (01/22 0400) Temp Source: Oral (01/22 0400) Pulse Rate: 110 (01/22 0323)  Labs: Recent Labs    06/20/20 0519 06/20/20 1143 06/21/20 0425 06/21/20 1907 06/22/20 0421  HGB 8.0*  --  8.1*  --  8.1*  HCT 26.8*  --  27.7*  --  27.5*  PLT 300  --  269  --  268  HEPARINUNFRC 0.61   < > 0.38 0.23* 0.22*  CREATININE 1.13  --  0.98  --   --    < > = values in this interval not displayed.    Estimated Creatinine Clearance: 211.1 mL/min (by C-G formula based on SCr of 0.98 mg/dL).   Assessment: 49YOM admitted with septic shock secondary to necrotizing fascitis, on heparin for afib.  Heparin level trended down to subtherapeutic at 0.22 on heparin 4400 units/hr. The patient is requiring two heparin bags to achieve a therapeutic rate - this has been double checked and verified by pharmacy. Heparin infusion 2200 units/hr running in each bag. CBC stable. Per RN no issues with bleeding or infusion.    Goal of Therapy:  Heparin level 0.3-0.7 units/ml Monitor platelets by anticoagulation protocol: Yes   Plan:  Increase heparin to 4800 units/hr (this will run as 2 bags at 2400 units/hr each) Will f/u 6hr heparin level  06/24/20, PharmD, BCPS Please see amion for complete clinical pharmacist phone list 06/22/2020 4:52 AM

## 2020-06-22 NOTE — Progress Notes (Signed)
Pharmacy Electrolyte Replacement  Recent Labs:  Recent Labs    06/22/20 0421  K 4.1  MG 1.6*  CREATININE 1.08    Low Critical Values (K </= 2.5, Phos </= 1, Mg </= 1) Present: None   Plan:  - Mg 1.6 - will supplement with 4g IV x 1 - F/u Mg with AM labs  Thank you for allowing pharmacy to be a part of this patient's care.  Georgina Pillion, PharmD, BCPS Clinical Pharmacist Clinical phone for 06/22/2020: [phone] 06/22/2020 8:57 AM   **Pharmacist phone directory can now be found on [email or web address] ([email or web address]).  Listed under [facility].

## 2020-06-22 NOTE — Progress Notes (Signed)
PCCM Interval Note  Persistent fever now 105. Consulted ID for consultation and recommendations. Possible drug fever. Advised to change antibiotic regimen to Linezolid, Levaquin and flagyl based on wound cultures. Will DC cefepime. Continue supportive care including tylenol, cooling blanket and ice packs.  Mechele Collin, M.D. [facility] Medicine 06/22/2020 12:09 PM

## 2020-06-22 NOTE — Progress Notes (Signed)
ANTICOAGULATION CONSULT NOTE - Follow Up Consult  Pharmacy Consult for Heparin Indication: atrial fibrillation  Patient Measurements: Height: 5\' 10"  (177.8 cm) Weight: (!) 295.3 kg (651 lb) IBW/kg (Calculated) : 73 Heparin Dosing Weight: 149.7 kg  Vital Signs: Temp: 103.2 F (39.6 C) (01/22 0400) Temp Source: Oral (01/22 0400) Pulse Rate: 126 (01/22 0828)  Labs: Recent Labs    06/20/20 0519 06/20/20 1143 06/21/20 0425 06/21/20 1907 06/22/20 0421  HGB 8.0*  --  8.1*  --  8.1*  HCT 26.8*  --  27.7*  --  27.5*  PLT 300  --  269  --  268  HEPARINUNFRC 0.61   < > 0.38 0.23* 0.22*  CREATININE 1.13  --  0.98  --  1.08   < > = values in this interval not displayed.    Estimated Creatinine Clearance: 191.5 mL/min (by C-G formula based on SCr of 1.08 mg/dL).   Assessment: 49YOM admitted with septic shock secondary to necrotizing fascitis, on heparin for afib.  Heparin level this afternoon is therapeutic after a rate increase earlier today (HL 0.48 << 0.22, goal of 0.3-0.7).   The patient is requiring two heparin bags to achieve a therapeutic rate - this has been double checked and verified by pharmacy. Heparin infusion 2400 units/hr running in each bag. CBC stable. Per RN no issues with bleeding or infusion.   Goal of Therapy:  Heparin level 0.3-0.7 units/ml Monitor platelets by anticoagulation protocol: Yes   Plan:  - Continue Heparin at 4800 units/hr (RN running 2 bags - each at a rate of 2400 units/hr). Verified rate on pump as correct - Will continue to monitor for any signs/symptoms of bleeding and will follow up with heparin level in 6 hours to confirm therapeutic  - Once therapeutic - will adjust back to twice daily checks with high Heparin rate (ordered to start on 1/23 AM)  Thank you for allowing pharmacy to be a part of this patient's care.  2/23, PharmD, BCPS Clinical Pharmacist Clinical phone for 06/22/2020: 06/24/2020 06/22/2020 9:01 AM    **Pharmacist phone directory can now be found on [email or web address] ([email or web address]).  Listed under [facility].

## 2020-06-22 NOTE — Progress Notes (Signed)
Called [employer] and spoke w/Gretchen Charity fundraiser... notified her that pt has had a persistent elevated temperature w/most recent of 103.2 oral.   I have given pt acetaminophen and placed ice pack w/no relief.   Asked for a cooling blanket order and will also give more acetaminophen.   Will continue to monitor pt.

## 2020-06-22 NOTE — Progress Notes (Signed)
ANTICOAGULATION CONSULT NOTE - Follow Up Consult  Pharmacy Consult for Heparin Indication: atrial fibrillation  Patient Measurements: Height: 5\' 10"  (177.8 cm) Weight: (!) 295.3 kg (651 lb) IBW/kg (Calculated) : 73 Heparin Dosing Weight: 149.7 kg  Vital Signs: Temp: 105.6 F (40.9 C) (01/22 1129) Pulse Rate: 130 (01/22 1547)  Labs: Recent Labs    06/20/20 0519 06/20/20 1143 06/21/20 0425 06/21/20 1907 06/22/20 0421 06/22/20 1239 06/22/20 1840  HGB 8.0*  --  8.1*  --  8.1*  --   --   HCT 26.8*  --  27.7*  --  27.5*  --   --   PLT 300  --  269  --  268  --   --   HEPARINUNFRC 0.61   < > 0.38   < > 0.22* 0.48 0.53  CREATININE 1.13  --  0.98  --  1.08  --   --    < > = values in this interval not displayed.    Estimated Creatinine Clearance: 191.5 mL/min (by C-G formula based on SCr of 1.08 mg/dL).   Assessment: 49YOM admitted with septic shock secondary to necrotizing fascitis, on heparin for afib.  The patient is requiring two heparin bags to achieve a therapeutic rate - this has been double checked and verified by pharmacy. Heparin infusion 2400 units/hr running in each bag. HL 0.53 is therapeutic.    Goal of Therapy:  Heparin level 0.3-0.7 units/ml Monitor platelets by anticoagulation protocol: Yes   Plan:  Continue Heparin at 4800 units/hr (RN running 2 bags - each at a rate of 2400 units/hr) Monitor twice daily HL, CBC/plt Monitor for signs/symptoms of bleeding     Thank you for allowing pharmacy to be a part of this patient's care.  06/24/20, PharmD, BCPS, BCCP Clinical Pharmacist  Please check AMION for all [facility] phone numbers After 10:00 PM, call Main Pharmacy [phone]

## 2020-06-22 NOTE — Progress Notes (Signed)
Contacted by Dr. Daiva Eves with request to evaluate patient's wound for additional extensive surgical debridement in setting of fevers going back up to 105 range.  Photos reviewed, not significantly different than yesterday's when surgery team evaluated.  There is a superficial necrotic area just posterior and slightly to the patient left of the anal verge which can likely be sharply debrided at the bedside, the remainder of the wound looks healthy and viable with some patchy areas of fibrinous exudate and peripheral eschar formation.  CT images reviewed personally, agree that this is limited by the patient's habitus but there is nothing overt.  White blood cell count slowly trending down, patient is currently off pressors but tachycardia has worsened in the last 24 hours. Surgery team will come by and do a formal examination tomorrow morning, please hold tube feeds after midnight.

## 2020-06-22 NOTE — Progress Notes (Signed)
NAME:  Carl Hill, MRN:  [id number], DOB:  [birth year], LOS: 12 ADMISSION DATE:  06/10/2020, CONSULTATION DATE:  06/10/2020 REFERRING MD:  Lorin Mercy, CHIEF COMPLAINT:  Severe Sepsis    Brief History:  50 yo male presented with buttock pain, fatigue and malaise.  Found to have hyperglycemia.  CT pelvis showed changes of necrotizing fasciitis in Lt buttock, Lt upper thigh, and perineum.  Taken to OR for debridement and remained on pressors and ventilator.  Past Medical History:  HTN, obesity, DM, afib on DOAC  Significant Hospital Events:  1/10  Admitted to [facility]. General surgery consulted and debridement pursued. In the afternoon, patient developed hypotension with AF in RVR. Synchronized cardioversion unsuccessful with conversion to VT. CPR initated with ROSC after desynchronized defibrillation. 1/11 debridement 1/18 mucus plugging 1/19 bronchoscopy >> mucus plug on Rt, airway collapse with exhalation 1/20 persistent fever, change ABx  Consults:  General surgery  Procedures:  ETT 1/10 >>  Rt IJ CVL 1/10 >>  Lt brachial a line 1/10 >>   Significant Diagnostic Tests:   CT abd/pelvis 1/10 >>  Extensive subcutaneous emphysema in the medial aspect of the upper thighs extending through perineum and superiorly to the medial left buttock. Gas extends into the pelvis surrounding the anus inferiorly and tracking superiorly along the left wall of the rectum.  Micro Data:  COVID/Flu 1/10 >> negative MRSA PCR 1/10 >> negative Lt buttocks wound 1/10 >> E coli, Streptococcus infantrarious, Peptostreptococcus species, rare Group B Strep Blood 1/10 >> negative BAL 1/20 > Normal flora  Antimicrobials:  Aztreonam 1/09 Metronidazole 1/09 Vancomycin 1/09, 1/11 Cefepime 1/10 - 1/13  Clindamycin 1/10 -1/13 Ceftriaxone 1/13 >> 1/20 Metronidazole 1/17 >>  Cefepime 1/20 >> Vancomycin 1/20 >>  Interim History / Subjective:  Off pressors Continues to be febrile with Tmax 103.2 Continues to be  on max vent settings with high PEEP requirements +18L since admission  Objective   Blood pressure (!) 111/47, pulse (!) 126, temperature (!) 103.2 F (39.6 C), temperature source Oral, resp. rate (!) 21, height _0  (1.778 m), weight (!) 295.3 kg, SpO2 96 %.    Vent Mode: PCV FiO2 (%):  [100 %] 100 % Set Rate:  [18 bmp] 18 bmp PEEP:  [14 cmH20] 14 cmH20 Plateau Pressure:  [27 cmH20-33 cmH20] 32 cmH20   Intake/Output Summary (Last 24 hours) at 06/22/2020 1032 Last data filed at 06/22/2020 0945 Gross per 24 hour  Intake 5171.44 ml  Output 4190 ml  Net 981.44 ml   Filed Weights   06/19/20 0411 06/21/20 0500 06/22/20 0500  Weight: (!) 286.7 kg (!) 295.3 kg (!) 295.3 kg   Physical Exam: General: Morbidly obese, no acute distress HENT: , AT, OP clear, MMM Eyes: EOMI, no scleral icterus Respiratory: Diminished breaths bilaterally. Difficult to auscultate due to body habitus Cardiovascular: RRR, -M/R/G, no JVD GI: BS+, soft, nontender Extremities:1-2+ pitting edema,-tenderness Neuro: Sedated, no withdrawal GU: Foley in place  Resolved problems:  VT arrest 1/10, Ileus 1/15, Elevated LFTs from shock, Metabolic acidosis with lactic acidosis  Assessment & Plan:   Septic shock from necrotizing fasciitis of Lt buttocks s/p debridement - improving Concern for HCAP 1/20 with persistent fever. - Off pressors. Goal MAP > 65 - Wound care - Reported PCN allergy, but has tolerated rocephin - Day 14 of ABx; Broadened to vancomycin and cefepime on 1/20 and continue flagyl - DC Vanc   Acute on chronic hypoxic/hypercapnic respiratory failure in setting of sepsis. - Noted to have mucus  plugging and dynamic airway collapse during bronchoscopy on 1/19 - Continue full vent support. Maintain PEEP at minimum 12 cm H20 - Consider trach when hemodynamics improved. Previously been d/w pt's family who wish to pursue if indicated - Start lasix 80 mg TID and metolazone. Currently +18L since  admission  - Discussed with pharmacy to concentrate meds to minimize fluid intake - Nephrology consulted as patient may need dialysis for volume removal due to BP  Hypernatremia - Continue FWF  A fib with RVR. HLD. - continue lipitor, amiodarone - heparin gtt  DM type 2 poorly controlled with hyperglycemia. - SSI  Hypothyroidism. - continue synthroid  Acute metabolic encephalopathy 2nd to sepsis. 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 - PAD protocol for RASS goal -1 to -2: On fentanyl - Continue lyrica, nortriptyline  Anemia of critical illness. - f/u CBC - transfuse for Hb < 7 or significant bleeding  Best practice (evaluated daily)  Diet: tube feeds DVT prophylaxis: Heparin gtt  GI prophylaxis: Protonix  Mobility: Bedrest Disposition: ICU Family: updated pt's mother via phone on 1/22 Code status: full code  Labs    CMP Latest Ref Rng & Units 06/22/2020 06/21/2020 06/20/2020  Glucose 70 - 99 mg/dL 176(H) 197(H) 192(H)  BUN 6 - 20 mg/dL 39(H) 44(H) 44(H)  Creatinine 0.61 - 1.24 mg/dL 1.08 0.98 1.13  Sodium 135 - 145 mmol/L 153(H) 150(H) 146(H)  Potassium 3.5 - 5.1 mmol/L 4.1 4.6 4.3  Chloride 98 - 111 mmol/L 118(H) 115(H) 111  CO2 22 - 32 mmol/L _0 Calcium 8.9 - 10.3 mg/dL 7.8(L) 7.7(L) 7.8(L)  Total Protein 6.5 - 8.1 g/dL - - -  Total Bilirubin 0.3 - 1.2 mg/dL - - -  Alkaline Phos 38 - 126 U/L - - -  AST 15 - 41 U/L - - -  ALT 0 - 44 U/L - - -    CBC Latest Ref Rng & Units 06/22/2020 06/21/2020 06/20/2020  WBC 4.0 - 10.5 K/uL 16.8(H) 20.6(H) 18.3(H)  Hemoglobin 13.0 - 17.0 g/dL 8.1(L) 8.1(L) 8.0(L)  Hematocrit 39.0 - 52.0 % 27.5(L) 27.7(L) 26.8(L)  Platelets 150 - 400 K/uL 268 269 300    ABG    Component Value Date/Time   PHART 7.289 (L) 06/18/2020 0205   PCO2ART 62.6 (H) 06/18/2020 0205   PO2ART 65 (L) 06/18/2020 0205   HCO3 29.9 (H) 06/18/2020 0205   TCO2 32 06/18/2020 0205   ACIDBASEDEF 2.0 06/13/2020 1221   O2SAT 89.0 06/18/2020 0205    CBG  (last 3)  Recent Labs    06/21/20 2331 06/22/20 0341 06/22/20 0814  GLUCAP 129* 155* 175*   Critical care time:    The patient is critically ill with multiple organ systems failure and requires high complexity decision making for assessment and support, frequent evaluation and titration of therapies, application of advanced monitoring technologies and extensive interpretation of multiple databases.  Independent Critical Care Time: 59 Minutes.   Rodman Pickle, M.D. [facility] Pulmonary/Critical Care Medicine 06/22/2020 10:55 AM   Please see Amion for pager number to reach on-call Pulmonary and Critical Care Team.

## 2020-06-22 NOTE — Consult Note (Addendum)
Carl Hill Nephrology Consultation Note  Requesting MD: Dr Mechele CollinEllison, Jane Reason for consult: Volume overload.   HPI:  Pattricia BossJonathan D Eagleton is a 50 y.o. male with morbid obesity, hypertension, DM, A. fib who was initially admitted on 1/10 for necrotizing fasciitis of left buttock and perineum underwent I&D, seen as a consultation for fluid overload and hypernatremia. During admission on 1/10, patient was taken to the OR.  In the afternoon he developed hypotension with A. fib with RVR.  The cardioversion was unsuccessful and then went to V. tach.  CPR initiated with ROSC after desynchronized defibrillator.  He also developed respiratory failure, mucous plugging and on ventilator.  Also with persistent fever.  He was treated for septic shock with multiple IV antibiotics and pressors.  Also developed acute kidney injury with peak creatinine level of 4 which gradually improved to 1 today.  He was resuscitated with IV fluid and he is now net 12 L positive.  Given borderline low blood pressure and significant fluid overload the ICU team asked us to manage the volume. He has temperature up to 105 today and plan for CT scan of chest and abdomen. He is currently intubated, sedated.  Requiring pressors on and off. Unable to obtain review of system.  Creatinine, Ser  Date/Time Value Ref Range Status  06/22/2020 04:21 AM 1.08 0.61 - 1.24 mg/dL Final  16/10/960401/21/2022 54:0904:25 AM 0.98 0.61 - 1.24 mg/dL Final  81/19/147801/20/2022 29:5605:19 AM 1.13 0.61 - 1.24 mg/dL Final  21/30/865701/19/2022 84:6904:15 AM 1.23 0.61 - 1.24 mg/dL Final  62/95/284101/18/2022 32:4402:28 AM 1.38 (H) 0.61 - 1.24 mg/dL Final  01/02/725301/17/2022 66:4403:09 AM 1.44 (H) 0.61 - 1.24 mg/dL Final  03/47/425901/16/2022 56:3804:37 AM 1.98 (H) 0.61 - 1.24 mg/dL Final  75/64/332901/15/2022 51:8805:55 AM 2.82 (H) 0.61 - 1.24 mg/dL Final    Comment:    DELTA CHECK NOTED  06/14/2020 04:51 AM 4.12 (H) 0.61 - 1.24 mg/dL Final  41/66/063001/13/2022 16:0104:10 AM 4.94 (H) 0.61 - 1.24 mg/dL Final  09/32/355701/05/2021 32:2009:38 PM 4.89 (H) 0.61 - 1.24  mg/dL Final  25/42/706201/05/2021 37:6204:05 AM 4.47 (H) 0.61 - 1.24 mg/dL Final  83/15/176101/04/2021 60:7308:19 AM 3.63 (H) 0.61 - 1.24 mg/dL Final  71/06/269401/04/2021 85:4603:04 AM 3.39 (H) 0.61 - 1.24 mg/dL Final  27/03/500901/03/2021 38:1808:47 PM 4.00 (H) 0.61 - 1.24 mg/dL Final  29/93/716901/03/2021 67:8903:50 PM 3.71 (H) 0.61 - 1.24 mg/dL Final  38/10/175101/03/2021 02:5802:44 AM 3.56 (H) 0.61 - 1.24 mg/dL Final     PMHx:   Past Medical History:  Diagnosis Date  . Asthma   . Atrial fibrillation (HCC)   . Diabetes mellitus without complication (HCC)   . History of cardioversion    3 times   . Hypertension     Past Surgical History:  Procedure Laterality Date  . ABDOMINAL SURGERY    . INCISION AND DRAINAGE PERIRECTAL ABSCESS N/A 06/11/2020   Procedure: IRRIGATION AND DEBRIDEMENT PERINEAL WOUND;  Surgeon: Fritzi MandesAllen, Shelby L, MD;  [facility];  Service: General;  Laterality: N/A;  . IRRIGATION AND DEBRIDEMENT ABSCESS N/A 06/10/2020   Procedure: IRRIGATION AND DEBRIDEMENT ABSCESS;  Surgeon: Fritzi MandesAllen, Shelby L, MD;  Location: [facility] OR;  Service: General;  Laterality: N/A;  . LAPAROSCOPIC GASTRIC SLEEVE RESECTION      Family Hx: History reviewed. No pertinent family history.  Social History:  reports that he has been smoking. He has never used smokeless tobacco. He reports previous alcohol use. He reports that he does not use drugs.  Allergies:  Allergies  Allergen Reactions  .  Bee Venom Anaphylaxis    Other reaction(s): Unknown Other reaction(s): Unknown Other reaction(s): Unknown   . Penicillins Hives and Anaphylaxis    unknown   . Sglt2 Inhibitors Other (See Comments)    Necrotizing infection of the perineum  . Codeine Other (See Comments)    Pt states that his mother told him he is allergic, but has taken this medication multiple ties with no problems.    . Other Hives    Medications: Prior to Admission medications   Medication Sig Start Date End Date Taking? Authorizing Provider  albuterol (VENTOLIN HFA) 108 (90 Base) MCG/ACT inhaler Inhale 2  puffs into the lungs 4 (four) times daily as needed for shortness of breath or wheezing. 12/06/13  Yes [provider]  amiodarone (PACERONE) 200 MG tablet Take 1 tablet by mouth 2 (two) times daily. 01/17/15  Yes [provider]  APPLE CIDER VINEGAR PO Take 1 tablet by mouth daily.   Yes [provider]  atorvastatin (LIPITOR) 20 MG tablet Take 20 mg by mouth every evening. 04/11/19  Yes [provider]  Beta Carotene (VITAMIN A) 25000 UNIT capsule Take 25,000 Units by mouth daily.   Yes [provider]  Cholecalciferol 50 MCG (2000 UT) CAPS Take 2,000 Units by mouth daily. 06/02/19  Yes [provider]  ezetimibe (ZETIA) 10 MG tablet Take 10 mg by mouth daily. 04/05/20  Yes [provider]  levothyroxine (SYNTHROID) 75 MCG tablet Take 75 mcg by mouth daily. 04/03/20  Yes [provider]  liraglutide (VICTOZA) 18 MG/3ML SOPN Inject 1.8 mLs into the skin every evening. 04/28/14  Yes [provider]  lisinopril (ZESTRIL) 10 MG tablet Take 10 mg by mouth daily. 04/05/20  Yes [provider]  metFORMIN (GLUCOPHAGE) 500 MG tablet Take 500 mg by mouth in the morning and at bedtime. 04/03/20  Yes [provider]  metoprolol tartrate (LOPRESSOR) 100 MG tablet Take 100 mg by mouth 2 (two) times daily. 04/05/20  Yes [provider]  Multiple Vitamins-Minerals (ALIVE MENS ENERGY PO) Take 1 tablet by mouth daily.   Yes [provider]  nortriptyline (PAMELOR) 25 MG capsule Take 25 mg by mouth in the morning and at bedtime. 03/13/20  Yes [provider]  nystatin (MYCOSTATIN/NYSTOP) powder Apply 1 application topically daily as needed (rash). 04/11/19  Yes [provider]  Omega-3 Fatty Acids (FISH OIL) 1000 MG CAPS Take 1,000 mg by mouth daily.   Yes [provider]  omeprazole (PRILOSEC) 20 MG capsule Take 20 mg by mouth in the morning and at bedtime. 03/25/20  Yes [provider]  polyethylene glycol powder (GLYCOLAX/MIRALAX) 17 GM/SCOOP powder Take 17 g by mouth daily as needed for mild constipation.   Yes [provider]  pregabalin (LYRICA) 300 MG capsule Take 300 mg by mouth in the morning and at bedtime. 03/13/20  Yes [provider]  XARELTO 20 MG TABS tablet Take 20 mg by mouth daily. 04/19/20  Yes [provider]  Zinc 220 (50 Zn) MG CAPS Take 220 mg by mouth daily.   Yes [provider]    I have reviewed the patient's current medications.  Labs:  Results for orders placed or performed during the hospital encounter of 06/10/20 (from the past 48 hour(s))  Glucose, capillary     Status: Abnormal   Collection Time: 06/20/20  3:45 PM  Result Value Ref Range   Glucose-Capillary 167 (H) 70 - 99 mg/dL    Comment:  Glucose reference range applies only to samples taken after fasting for at least 8 hours.  Glucose, capillary     Status: Abnormal   Collection Time: 06/20/20  7:49 PM  Result Value Ref Range   Glucose-Capillary 226 (H) 70 - 99 mg/dL    Comment: Glucose reference range applies only to samples taken after fasting for at least 8 hours.  Glucose, capillary     Status: Abnormal   Collection Time: 06/20/20 11:34 PM  Result Value Ref Range   Glucose-Capillary 203 (H) 70 - 99 mg/dL    Comment: Glucose reference range applies only to samples taken after fasting for at least 8 hours.  Glucose, capillary     Status: Abnormal   Collection Time: 06/21/20  3:43 AM  Result Value Ref Range   Glucose-Capillary 168 (H) 70 - 99 mg/dL    Comment: Glucose reference range applies only to samples taken after fasting for at least 8 hours.  Triglycerides     Status: None   Collection Time: 06/21/20  4:25 AM  Result Value Ref Range   Triglycerides 73 <150 mg/dL    Comment: Performed at [facility], [address], [place], [place] [address]  CBC     Status: Abnormal   Collection Time: 06/21/20  4:25 AM  Result  Value Ref Range   WBC 20.6 (H) 4.0 - 10.5 K/uL   RBC 2.78 (L) 4.22 - 5.81 MIL/uL   Hemoglobin 8.1 (L) 13.0 - 17.0 g/dL   HCT 42.3 (L) 53.6 - 14.4 %   MCV 99.6 80.0 - 100.0 fL   MCH 29.1 26.0 - 34.0 pg   MCHC 29.2 (L) 30.0 - 36.0 g/dL   RDW 31.5 (H) 40.0 - 86.7 %   Platelets 269 150 - 400 K/uL   nRBC 2.3 (H) 0.0 - 0.2 %    Comment: Performed at [facility], [address] [place], [place] [address]  Basic metabolic panel     Status: Abnormal   Collection Time: 06/21/20  4:25 AM  Result Value Ref Range   Sodium 150 (H) 135 - 145 mmol/L   Potassium 4.6 3.5 - 5.1 mmol/L   Chloride 115 (H) 98 - 111 mmol/L   CO2 26 22 - 32 mmol/L   Glucose, Bld 197 (H) 70 - 99 mg/dL    Comment: Glucose reference range applies only to samples taken after fasting for at least 8 hours.   BUN 44 (H) 6 - 20 mg/dL   Creatinine, Ser 9.32 0.61 - 1.24 mg/dL   Calcium 7.7 (L) 8.9 - 10.3 mg/dL   GFR, Estimated >67 >12 mL/min    Comment: (NOTE) Calculated using the CKD-EPI Creatinine Equation (2021)    Anion gap 9 5 - 15    Comment: Performed at [facility], [address] [place], [place] [address]  Heparin level (unfractionated)     Status: None   Collection Time: 06/21/20  4:25 AM  Result Value Ref Range   Heparin Unfractionated 0.38 0.30 - 0.70 IU/mL    Comment: (NOTE) If heparin results are below expected values, and patient dosage has  been confirmed, suggest follow up testing of antithrombin III levels. Performed at [facility], [address], [place], [place] [address]   Glucose, capillary     Status: Abnormal   Collection Time: 06/21/20  8:03 AM  Result Value Ref Range   Glucose-Capillary 186 (H) 70 - 99 mg/dL    Comment:  Glucose reference range applies only to samples taken after fasting for at least 8 hours.  Glucose, capillary     Status: Abnormal   Collection Time: 06/21/20 11:22 AM  Result Value Ref Range   Glucose-Capillary 213 (H) 70 - 99 mg/dL    Comment:  Glucose reference range applies only to samples taken after fasting for at least 8 hours.  Glucose, capillary     Status: Abnormal   Collection Time: 06/21/20  3:30 PM  Result Value Ref Range   Glucose-Capillary 172 (H) 70 - 99 mg/dL    Comment: Glucose reference range applies only to samples taken after fasting for at least 8 hours.  Heparin level (unfractionated)     Status: Abnormal   Collection Time: 06/21/20  7:07 PM  Result Value Ref Range   Heparin Unfractionated 0.23 (L) 0.30 - 0.70 IU/mL    Comment: (NOTE) If heparin results are below expected values, and patient dosage has  been confirmed, suggest follow up testing of antithrombin III levels. Performed at [facility], [address] [place], [place] [address]   Glucose, capillary     Status: Abnormal   Collection Time: 06/21/20  8:16 PM  Result Value Ref Range   Glucose-Capillary 152 (H) 70 - 99 mg/dL    Comment: Glucose reference range applies only to samples taken after fasting for at least 8 hours.  Glucose, capillary     Status: Abnormal   Collection Time: 06/21/20 11:31 PM  Result Value Ref Range   Glucose-Capillary 129 (H) 70 - 99 mg/dL    Comment: Glucose reference range applies only to samples taken after fasting for at least 8 hours.  Glucose, capillary     Status: Abnormal   Collection Time: 06/22/20  3:41 AM  Result Value Ref Range   Glucose-Capillary 155 (H) 70 - 99 mg/dL    Comment: Glucose reference range applies only to samples taken after fasting for at least 8 hours.  CBC     Status: Abnormal   Collection Time: 06/22/20  4:21 AM  Result Value Ref Range   WBC 16.8 (H) 4.0 - 10.5 K/uL   RBC 2.82 (L) 4.22 - 5.81 MIL/uL   Hemoglobin 8.1 (L) 13.0 - 17.0 g/dL   HCT 19.1 (L) 47.8 - 29.5 %   MCV 97.5 80.0 - 100.0 fL   MCH 28.7 26.0 - 34.0 pg   MCHC 29.5 (L) 30.0 - 36.0 g/dL   RDW 62.1 (H) 30.8 - 65.7 %   Platelets 268 150 - 400 K/uL   nRBC 2.4 (H) 0.0 - 0.2 %    Comment: Performed at [facility], [address], [place], [place] [address]  Heparin level (unfractionated)     Status: Abnormal   Collection Time: 06/22/20  4:21 AM  Result Value Ref Range   Heparin Unfractionated 0.22 (L) 0.30 - 0.70 IU/mL    Comment: (NOTE) If heparin results are below expected values, and patient dosage has  been confirmed, suggest follow up testing of antithrombin III levels. Performed at [facility], [address] [place], [place] [address]   Basic metabolic panel     Status: Abnormal   Collection Time: 06/22/20  4:21 AM  Result Value Ref Range   Sodium 153 (H) 135 - 145 mmol/L   Potassium 4.1 3.5 - 5.1 mmol/L   Chloride 118 (H) 98 - 111 mmol/L   CO2 26 22 - 32 mmol/L   Glucose, Bld 176 (  H) 70 - 99 mg/dL    Comment: Glucose reference range applies only to samples taken after fasting for at least 8 hours.   BUN 39 (H) 6 - 20 mg/dL   Creatinine, Ser 1.611.08 0.61 - 1.24 mg/dL   Calcium 7.8 (L) 8.9 - 10.3 mg/dL   GFR, Estimated >09>60 >60>60 mL/min    Comment: (NOTE) Calculated using the CKD-EPI Creatinine Equation (2021)    Anion gap 9 5 - 15    Comment: Performed at [facility], [address] [place], [place] [address]  Magnesium     Status: Abnormal   Collection Time: 06/22/20  4:21 AM  Result Value Ref Range   Magnesium 1.6 (L) 1.7 - 2.4 mg/dL    Comment: Performed at [facility], [address] [place], [place] [address]  Glucose, capillary     Status: Abnormal   Collection Time: 06/22/20  8:14 AM  Result Value Ref Range   Glucose-Capillary 175 (H) 70 - 99 mg/dL    Comment: Glucose reference range applies only to samples taken after fasting for at least 8 hours.  Glucose, capillary     Status: Abnormal   Collection Time: 06/22/20 12:27 PM  Result Value Ref Range   Glucose-Capillary 216 (H) 70 - 99 mg/dL    Comment: Glucose reference range applies only to samples taken after fasting for at least 8 hours.  Heparin level (unfractionated)     Status: None    Collection Time: 06/22/20 12:39 PM  Result Value Ref Range   Heparin Unfractionated 0.48 0.30 - 0.70 IU/mL    Comment: (NOTE) If heparin results are below expected values, and patient dosage has  been confirmed, suggest follow up testing of antithrombin III levels. Performed at [facility], [address] [place], [place] [address]      ROS: Unable to obtain review of system as patient is sedated.  Physical Exam: Vitals:   06/22/20 1129 06/22/20 1200  BP:    Pulse:    Resp:  (!) 22  Temp: (!) 105.6 F (40.9 C)   SpO2:  94%     General exam: Morbidly obese male lying on bed, sedated, intubated Respiratory system: Coarse and distant breath sound bilateral Cardiovascular system: S1 & S2 heard, RRR.  Anasarca Gastrointestinal system: Abdomen is distended, soft  Central nervous system: Sedated. Extremities: Pitting edema in all extremities Skin: No rashes, lesions or ulcers Psychiatry: Unable to assess as patient with encephalopathy and sedation..   Assessment/Plan:  #Volume overload/anasarca: Patient received multiple IV fluid and net 12 L positive. He got Lasix 80 mg and metolazone 5 mg after discussion with PCCM today with around 7.4 L net urine output.  I will hold further diuretics today.  Monitor labs and electrolytes very closely.  I do not think patient will need dialysis at this time especially since he is responding with diuresis.  #Acute kidney injury, nonoliguric in the setting of septic shock: Now improved.  I advised to avoid IV contrast if possible.  Maintain BP.  #Hypernatremia: Problem with urinary concentration due to recovering AKI/ATN.  He is volume up therefore continue diuresis.  Monitor lab.  #Septic shock due to necrotizing fasciitis, HCAP: He was off of pressors.  He is febrile to 105 today.  He completed multiple IV antibiotics.  Now ID was consulted by primary team.    #Acute on chronic hypoxic/hypercapnic respiratory failure: On mechanical  ventilation with very high PEEP.  Getting CT chest.  Diuresis as discussed above.  #Acute metabolic encephalopathy due to sepsis, sedation:  Recommendation relayed with primary team. Thank you for the consult, we will follow with you.   Kinlie Janice Jaynie Collins 06/22/2020, 3:00 PM  [employer].

## 2020-06-23 DIAGNOSIS — N493 Fournier gangrene: Secondary | ICD-10-CM | POA: Diagnosis not present

## 2020-06-23 DIAGNOSIS — A401 Sepsis due to streptococcus, group B: Secondary | ICD-10-CM | POA: Diagnosis not present

## 2020-06-23 DIAGNOSIS — E039 Hypothyroidism, unspecified: Secondary | ICD-10-CM | POA: Diagnosis not present

## 2020-06-23 DIAGNOSIS — R6521 Severe sepsis with septic shock: Secondary | ICD-10-CM | POA: Diagnosis not present

## 2020-06-23 LAB — POCT I-STAT 7, (LYTES, BLD GAS, ICA,H+H)
Acid-Base Excess: 10 mmol/L — ABNORMAL HIGH (ref 0.0–2.0)
Bicarbonate: 35.5 mmol/L — ABNORMAL HIGH (ref 20.0–28.0)
Calcium, Ion: 1.12 mmol/L — ABNORMAL LOW (ref 1.15–1.40)
HCT: 25 % — ABNORMAL LOW (ref 39.0–52.0)
Hemoglobin: 8.5 g/dL — ABNORMAL LOW (ref 13.0–17.0)
O2 Saturation: 94 %
Patient temperature: 99
Potassium: 3.5 mmol/L (ref 3.5–5.1)
Sodium: 160 mmol/L — ABNORMAL HIGH (ref 135–145)
TCO2: 37 mmol/L — ABNORMAL HIGH (ref 22–32)
pCO2 arterial: 57.4 mmHg — ABNORMAL HIGH (ref 32.0–48.0)
pH, Arterial: 7.401 (ref 7.350–7.450)
pO2, Arterial: 73 mmHg — ABNORMAL LOW (ref 83.0–108.0)

## 2020-06-23 LAB — HEPARIN LEVEL (UNFRACTIONATED)
Heparin Unfractionated: 0.87 IU/mL — ABNORMAL HIGH (ref 0.30–0.70)
Heparin Unfractionated: 0.89 IU/mL — ABNORMAL HIGH (ref 0.30–0.70)
Heparin Unfractionated: 0.7 IU/mL (ref 0.30–0.70)

## 2020-06-23 LAB — CBC
HCT: 27.6 % — ABNORMAL LOW (ref 39.0–52.0)
Hemoglobin: 7.8 g/dL — ABNORMAL LOW (ref 13.0–17.0)
MCH: 27.9 pg (ref 26.0–34.0)
MCHC: 28.3 g/dL — ABNORMAL LOW (ref 30.0–36.0)
MCV: 98.6 fL (ref 80.0–100.0)
Platelets: 209 10*3/uL (ref 150–400)
RBC: 2.8 MIL/uL — ABNORMAL LOW (ref 4.22–5.81)
RDW: 17.9 % — ABNORMAL HIGH (ref 11.5–15.5)
WBC: 12.3 10*3/uL — ABNORMAL HIGH (ref 4.0–10.5)
nRBC: 0.8 % — ABNORMAL HIGH (ref 0.0–0.2)

## 2020-06-23 LAB — GLUCOSE, CAPILLARY
Glucose-Capillary: 140 mg/dL — ABNORMAL HIGH (ref 70–99)
Glucose-Capillary: 89 mg/dL (ref 70–99)
Glucose-Capillary: 137 mg/dL — ABNORMAL HIGH (ref 70–99)
Glucose-Capillary: 165 mg/dL — ABNORMAL HIGH (ref 70–99)
Glucose-Capillary: 144 mg/dL — ABNORMAL HIGH (ref 70–99)

## 2020-06-23 LAB — BASIC METABOLIC PANEL
Anion gap: 9 (ref 5–15)
BUN: 37 mg/dL — ABNORMAL HIGH (ref 6–20)
CO2: 31 mmol/L (ref 22–32)
Calcium: 7.7 mg/dL — ABNORMAL LOW (ref 8.9–10.3)
Chloride: 116 mmol/L — ABNORMAL HIGH (ref 98–111)
Creatinine, Ser: 1.2 mg/dL (ref 0.61–1.24)
GFR, Estimated: >60 mL/min (ref >60)
Glucose, Bld: 145 mg/dL — ABNORMAL HIGH (ref 70–99)
Potassium: 3.2 mmol/L — ABNORMAL LOW (ref 3.5–5.1)
Sodium: 156 mmol/L — ABNORMAL HIGH (ref 135–145)

## 2020-06-23 LAB — MAGNESIUM: Magnesium: 2 mg/dL (ref 1.7–2.4)

## 2020-06-23 MED ORDER — POTASSIUM CHLORIDE 10 MEQ/50ML IV SOLN
10.0000 meq | INTRAVENOUS | Status: AC
  Administered 2020-06-23 (×4): 10 meq via INTRAVENOUS
  Filled 2020-06-23 (×4): qty 50

## 2020-06-23 MED ORDER — POTASSIUM CHLORIDE 20 MEQ PO PACK
20.0000 meq | PACK | ORAL | Status: DC
Start: 2020-06-23 — End: 2020-06-23
  Filled 2020-06-23: qty 1

## 2020-06-23 MED ORDER — FREE WATER
400.0000 mL | Freq: Three times a day (TID) | Status: DC
  Administered 2020-06-23 – 2020-06-24 (×3): 400 mL

## 2020-06-23 MED ORDER — FREE WATER: 400.0000 mL | Freq: Four times a day (QID) | Status: DC

## 2020-06-23 NOTE — Progress Notes (Signed)
Tube feeding held at 2330 as per note from Dr. Idamae Lusher for a formal exam in the am from the surgery team.

## 2020-06-23 NOTE — Progress Notes (Addendum)
[facility] NEPHROLOGY PROGRESS NOTE  Assessment/ Plan: Pt is a 50 y.o. yo male  with morbid obesity, hypertension, DM, A. fib who was initially admitted on 1/10 for necrotizing fasciitis of left buttock and perineum underwent I&D, seen as a consultation for fluid overload and hypernatremia.  #Volume overload/anasarca:  He received multiple IV fluid and was positive fluid balance and anasarca.  Received Lasix 80 mg IV and metolazone 5 mg yesterday with net 12.4 L of urine output.  I will hold further diuretics today.  Monitor urine output and a chest diuretics needed.  I do not think patient will need dialysis at this time especially since he is responding with diuresis.  #Acute kidney injury, nonoliguric in the setting of septic shock: AKI improved.  Mild fluctuation in creatinine level today probably hemodynamically mediated. I advised to avoid IV contrast if possible.  Maintain BP.  #Hypernatremia: Problem with urinary concentration due to recovering AKI/ATN.  He is volume up therefore continue diuresis.  Sodium level increased to 156 therefore starting free water from the tube. Monitor lab.  #Septic shock due to necrotizing fasciitis, HCAP: On meropenem now.  ID following.  #Acute on chronic hypoxic/hypercapnic respiratory failure: On mechanical ventilation with very high PEEP.  Getting CT chest.  Diuresis as discussed above.  #Acute metabolic encephalopathy due to sepsis, sedation:  #Hypokalemia: Replete potassium chloride.  Monitor electrolytes.  Subjective: Seen and examined.  Remains on ventilator but more alert.  Temperature coming down.  He had massive urine output to 12.4 L in 24 hours discussed with ICU team.. Objective Vital signs in last 24 hours: Vitals:   06/23/20 0600 06/23/20 0700 06/23/20 0800 06/23/20 0809  BP:  (!) 114/48    Pulse:  (!) 108  (!) 110  Resp: 15 (!) 23 17   Temp:  99 F (37.2 C)    TempSrc:  Core    SpO2: 99% 94% 96%   Weight:       Height:       Weight change: -18.6 kg  Intake/Output Summary (Last 24 hours) at 06/23/2020 0913 Last data filed at 06/23/2020 0800 Gross per 24 hour  Intake 2829.77 ml  Output 12580 ml  Net -9750.23 ml       Labs: Basic Metabolic Panel: Recent Labs  Lab 06/17/20 0309 06/18/20 0205 06/21/20 0425 06/22/20 0421 06/23/20 0432  NA 140   < > 150* 153* 156*  K 4.1   < > 4.6 4.1 3.2*  CL 103   < > 115* 118* 116*  CO2 26   < > '26 26 31  ' GLUCOSE 132*   < > 197* 176* 145*  BUN 49*   < > 44* 39* 37*  CREATININE 1.44*   < > 0.98 1.08 1.20  CALCIUM 8.2*   < > 7.7* 7.8* 7.7*  PHOS 2.4*  --   --   --   --    < > = values in this interval not displayed.   Liver Function Tests: No results for input(s): AST, ALT, ALKPHOS, BILITOT, PROT, ALBUMIN in the last 168 hours. No results for input(s): LIPASE, AMYLASE in the last 168 hours. No results for input(s): AMMONIA in the last 168 hours. CBC: Recent Labs  Lab 06/19/20 0415 06/20/20 0519 06/21/20 0425 06/22/20 0421 06/23/20 0432  WBC 17.8* 18.3* 20.6* 16.8* 12.3*  HGB 7.7* 8.0* 8.1* 8.1* 7.8*  HCT 25.1* 26.8* 27.7* 27.5* 27.6*  MCV 95.8 96.1 99.6 97.5 98.6  PLT 295 300 269  268 209   Cardiac Enzymes: No results for input(s): CKTOTAL, CKMB, CKMBINDEX, TROPONINI in the last 168 hours. CBG: Recent Labs  Lab 06/22/20 1602 06/22/20 2003 06/22/20 2333 06/23/20 0409 06/23/20 0756  GLUCAP 163* 151* 180* 140* 89    Iron Studies: No results for input(s): IRON, TIBC, TRANSFERRIN, FERRITIN in the last 72 hours. Studies/Results: CT CHEST ABDOMEN PELVIS W CONTRAST  Result Date: 06/22/2020 CLINICAL DATA:  Fever. Evaluate for intra-abdominal abscess. Morbid obesity. EXAM: CT CHEST, ABDOMEN, AND PELVIS WITH CONTRAST TECHNIQUE: Multidetector CT imaging of the chest, abdomen and pelvis was performed following the standard protocol during bolus administration of intravenous contrast. CONTRAST:  128m OMNIPAQUE IOHEXOL 300 MG/ML  SOLN  COMPARISON:  Chest radiographs, most recent 1 day prior. pelvic CT 06/10/2020. FINDINGS: Severely limited exam secondary to patient body habitus. CT CHEST FINDINGS Cardiovascular: Right internal jugular line tip at high SVC. Grossly normal aortic caliber. Moderate cardiomegaly. Pulmonary artery enlargement, outflow tract 3.2 cm. Mediastinum/Nodes: Extremely limited evaluation for thoracic adenopathy. Lungs/Pleura: Small bilateral pleural effusions. Endotracheal tube well above the carina. Right lower and right middle lobe consolidation. Mild dependent atelectasis in the left lower lobe. Left greater than right upper lobe ground-glass and less so airspace disease. No pneumothorax. Musculoskeletal: Mild right-sided gynecomastia. CT ABDOMEN PELVIS FINDINGS Hepatobiliary: Grossly normal liver. Gallbladder not well evaluated. Pancreas: No gross pancreatic abnormality. Spleen: Spleen grossly within normal limits. Adrenals/Urinary Tract: Normal adrenal glands. No gross hydronephrosis. Bladder decompressed around a Foley catheter. Stomach/Bowel: Nasogastric tube terminating at the gastric body. Normal caliber of bowel loops. No pneumatosis or free intraperitoneal air. Vascular/Lymphatic: Normal aortic caliber. Limited evaluation for abdominopelvic adenopathy. Reproductive: Grossly normal prostate. Other: Interval debridement of previously described perineal gas. Packing material identified about the gluteal crease. No well-defined residual or recurrent fluid collection. Musculoskeletal: No gross osseous abnormality. IMPRESSION: 1. Severely degraded exam secondary to patient body habitus. 2. Small bilateral pleural effusions with relatively diffuse pulmonary opacities as detailed above. Favor infection and/or aspiration. 3. Interval debridement of previously described perineal infection. No gross residual fluid collection and no evidence of residual soft tissue gas. 4. Pulmonary artery enlargement suggests pulmonary arterial  hypertension. Electronically Signed   By: KAbigail MiyamotoM.D.   On: 06/22/2020 15:36    Medications: Infusions: . sodium chloride    . feeding supplement (PIVOT 1.5 CAL) 1,000 mL (06/22/20 1021)  . fentaNYL infusion INTRAVENOUS 75 mcg/hr (06/23/20 0800)  . heparin 2,200 Units/hr (06/23/20 0800)   And  . heparin 2,200 Units/hr (06/23/20 0800)  . meropenem (MERREM) IV Stopped (06/23/20 0734)  . midazolam Stopped (06/21/20 0655)  . norepinephrine (LEVOPHED) Adult infusion Stopped (06/22/20 0945)  . phenylephrine (NEO-SYNEPHRINE) Adult infusion Stopped (06/22/20 0940)  . potassium chloride    . propofol (DIPRIVAN) infusion 35 mcg/kg/min (06/20/20 0859)  . vasopressin Stopped (06/21/20 0916)    Scheduled Medications: . amiodarone  200 mg Per Tube Daily  . atorvastatin  20 mg Per Tube QPM  . chlorhexidine gluconate (MEDLINE KIT)  15 mL Mouth Rinse BID  . Chlorhexidine Gluconate Cloth  6 each Topical Daily  . docusate  100 mg Per Tube BID  . feeding supplement (PROSource TF)  90 mL Per Tube QID  . free water  400 mL Per Tube Q8H  . insulin aspart  0-20 Units Subcutaneous Q4H  . insulin aspart  15 Units Subcutaneous Q4H  . insulin detemir  40 Units Subcutaneous Q12H  . levothyroxine  75 mcg Per Tube Daily  . mouth rinse  15 mL Mouth Rinse 10 times per day  . nortriptyline  25 mg Per Tube BID  . pantoprazole sodium  40 mg Per Tube Q24H  . polyethylene glycol  17 g Per Tube BID  . pregabalin  300 mg Per Tube BID  . sennosides  10 mL Per Tube BID  . sodium chloride flush  10-40 mL Intracatheter Q12H  . sodium chloride flush  3 mL Intravenous Q12H    have reviewed scheduled and prn medications.  Physical Exam: General: Morbidly obese male intubated and sedated Heart:RRR, s1s2 nl Lungs: Coarse breath sound bilateral Abdomen:soft,distended Extremities anasarca Dialysis Access: None  Jood Retana Prasad Yavonne Kiss 06/23/2020,9:13 AM  LOS: 13 days

## 2020-06-23 NOTE — Progress Notes (Signed)
ANTICOAGULATION CONSULT NOTE - Follow Up Consult  Pharmacy Consult for Heparin Indication: atrial fibrillation  Patient Measurements: Height: 5\' 10"  (177.8 cm) Weight: (!) 276.7 kg (610 lb) IBW/kg (Calculated) : 73 Heparin Dosing Weight: 149.7 kg  Vital Signs: Temp: 99 F (37.2 C) (01/23 0700) Temp Source: Core (01/23 0700) BP: 114/48 (01/23 0700) Pulse Rate: 110 (01/23 0809)  Labs: Recent Labs    06/21/20 0425 06/21/20 1907 06/22/20 0421 06/22/20 1239 06/22/20 1840 06/23/20 0432 06/23/20 0433  HGB 8.1*  --  8.1*  --   --  7.8*  --   HCT 27.7*  --  27.5*  --   --  27.6*  --   PLT 269  --  268  --   --  209  --   HEPARINUNFRC 0.38   < > 0.22* 0.48 0.53  --  0.87*  CREATININE 0.98  --  1.08  --   --  1.20  --    < > = values in this interval not displayed.    Estimated Creatinine Clearance: 164.5 mL/min (by C-G formula based on SCr of 1.2 mg/dL).   Assessment: 49YOM admitted with septic shock secondary to necrotizing fascitis, on heparin for afib.  Heparin level this afternoon remains SUPRAtherapeutic despite a rate decrease earlier today (HL 0.89 << << 0.87, goal of 0.3-0.7). RN drew via A-line, Heparin running through R-PIV. Will reduce and recheck.  The patient is requiring two heparin bags to achieve a therapeutic rate - this has been double checked and verified by pharmacy. Heparin infusion 2200 units/hr running in each bag. CBC stable. Per RN no issues with bleeding or infusion.   Goal of Therapy:  Heparin level 0.3-0.7 units/ml Monitor platelets by anticoagulation protocol: Yes   Plan:  - Reduce Heparin to 4000 units/hr (RN running 2 bags - each will be at a rate of 2000 units/hr). Verified rate on pump as correct - Will continue to monitor for any signs/symptoms of bleeding and will follow up with heparin level in 6 hours - Once therapeutic - will adjust back to twice daily checks with high Heparin rate (ordered to start on 1/23 AM)  Thank you for allowing  pharmacy to be a part of this patient's care.  2/23, PharmD, BCPS Clinical Pharmacist Clinical phone for 06/23/2020: 06/25/2020 06/23/2020 11:24 AM   **Pharmacist phone directory can now be found on [email or web address] ([email or web address]).  Listed under [facility].

## 2020-06-23 NOTE — Progress Notes (Signed)
NAME:  NASON CONRADT, MRN:  [id number], DOB:  April 02, 1971, LOS: 55 ADMISSION DATE:  06/10/2020, CONSULTATION DATE:  06/10/2020 REFERRING MD:  Lorin Mercy, CHIEF COMPLAINT:  Severe Sepsis    Brief History:  50 yo male presented with buttock pain, fatigue and malaise.  Found to have hyperglycemia.  CT pelvis showed changes of necrotizing fasciitis in Lt buttock, Lt upper thigh, and perineum.  Taken to OR for debridement and remained on pressors and ventilator.  Past Medical History:  HTN, obesity, DM, afib on DOAC  Significant Hospital Events:  1/10  Admitted to [facility]. General surgery consulted and debridement pursued. In the afternoon, patient developed hypotension with AF in RVR. Synchronized cardioversion unsuccessful with conversion to VT. CPR initated with ROSC after desynchronized defibrillation. 1/11 debridement 1/18 mucus plugging 1/19 bronchoscopy >> mucus plug on Rt, airway collapse with exhalation 1/20 persistent fever, change ABx  Consults:  General surgery  Procedures:  ETT 1/10 >>  Rt IJ CVL 1/10 >>  Lt brachial a line 1/10 >>   Significant Diagnostic Tests:   CT abd/pelvis 1/10 >>  Extensive subcutaneous emphysema in the medial aspect of the upper thighs extending through perineum and superiorly to the medial left buttock. Gas extends into the pelvis surrounding the anus inferiorly and tracking superiorly along the left wall of the rectum.  CT chest/abdomen/pelvis 06/23/19 > Imaging limited due to body habitus. No gross fluid collection or gas seen. Bilateral consolidation with dependent atelectasis  Micro Data:  COVID/Flu 1/10 >> negative MRSA PCR 1/10 >> negative Lt buttocks wound 1/10 >> E coli, Streptococcus infantrarious, Peptostreptococcus species, rare Group B Strep Blood 1/10 >> negative BAL 1/20 > Normal flora  Antimicrobials:  Aztreonam 1/09 Metronidazole 1/09 Vancomycin 1/09, 1/11 Cefepime 1/10 - 1/13  Clindamycin 1/10 -1/13 Ceftriaxone 1/13 >>  1/20 Metronidazole 1/17 >> 1/22 Cefepime 1/20 >>1/22 Vancomycin 1/20 >>1/22  Meropenem 1/22>  Interim History / Subjective:  Afebrile this am. Robust response to diuretics. Remains +~9L since admission   Objective   Blood pressure (!) 114/48, pulse (!) 110, temperature 99 F (37.2 C), temperature source Core, resp. rate 17, height _0  (1.778 m), weight (!) 276.7 kg, SpO2 96 %.    Vent Mode: PCV FiO2 (%):  [100 %] 100 % Set Rate:  [18 bmp] 18 bmp PEEP:  [14 cmH20] 14 cmH20 Plateau Pressure:  [27 cmH20-33 cmH20] 32 cmH20   Intake/Output Summary (Last 24 hours) at 06/23/2020 1108 Last data filed at 06/23/2020 1105 Gross per 24 hour  Intake 2726.54 ml  Output 9605 ml  Net -6878.46 ml   Filed Weights   06/21/20 0500 06/22/20 0500 06/23/20 0500  Weight: (!) 295.3 kg (!) 295.3 kg (!) 276.7 kg   Physical Exam: General: Morbidly obese-appearing, sedated HENT: [place], AT, ETT in place Eyes: EOMI, no scleral icterus Respiratory: Diminished breath sounds bilaterally.  No crackles, wheezing or rales Cardiovascular: RRR, -M/R/G, no JVD GI: BS+, soft, nontender Extremities:-Edema,-tenderness Neuro: Drowsy, sticks tongue out on command, does not move or withdraw extremities GU: Foley in place   Resolved problems:  VT arrest 1/10, Ileus 1/15, Elevated LFTs from shock, Metabolic acidosis with lactic acidosis Septic shock  Assessment & Plan:   Necrotizing fasciitis of Lt buttocks s/p debridement - improving HCAP Persistent fever - Appreciate ID recommendations. Broadened to meropenem - Surgery following. No indication for repeat debridement at this time - Wound care  Acute on chronic hypoxic/hypercapnic respiratory failure in setting of sepsis. - Noted to have mucus plugging and  dynamic airway collapse during bronchoscopy on 1/19 - Full vent support. Maintain PEEP at minimum 12 cm H20 - Consider trach when hemodynamics improved. Previously been d/w pt's family who wish to pursue  if indicated - Previously +18L. Responded very well to lasix and now +9L. Will hold diuretics today. No indication for dialysis as shock has resolved and UOP good  - Discussed with pharmacy to concentrate meds to minimize fluid intake  Hypernatremia - Increased FWF 400cc q8h  A fib with RVR. HLD. - continue lipitor, amiodarone - heparin gtt - Replete K for goal >4  DM type 2 poorly controlled with hyperglycemia. - SSI  Hypothyroidism. - continue synthroid  Acute metabolic encephalopathy 2nd to sepsis. 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 - PAD protocol for RASS goal -1 to -2: On fentanyl - Continue lyrica, nortriptyline  Anemia of critical illness. - f/u CBC - transfuse for Hb < 7 or significant bleeding  Best practice (evaluated daily)  Diet: Resume TF DVT prophylaxis: Heparin gtt  GI prophylaxis: Protonix  Mobility: Bedrest Disposition: ICU Family: updated pt's mother at bedside 1/23 Code status: full code  Labs    CMP Latest Ref Rng & Units 06/23/2020 06/22/2020 06/21/2020  Glucose 70 - 99 mg/dL 145(H) 176(H) 197(H)  BUN 6 - 20 mg/dL 37(H) 39(H) 44(H)  Creatinine 0.61 - 1.24 mg/dL 1.20 1.08 0.98  Sodium 135 - 145 mmol/L 156(H) 153(H) 150(H)  Potassium 3.5 - 5.1 mmol/L 3.2(L) 4.1 4.6  Chloride 98 - 111 mmol/L 116(H) 118(H) 115(H)  CO2 22 - 32 mmol/L _0 Calcium 8.9 - 10.3 mg/dL 7.7(L) 7.8(L) 7.7(L)  Total Protein 6.5 - 8.1 g/dL - - -  Total Bilirubin 0.3 - 1.2 mg/dL - - -  Alkaline Phos 38 - 126 U/L - - -  AST 15 - 41 U/L - - -  ALT 0 - 44 U/L - - -    CBC Latest Ref Rng & Units 06/23/2020 06/22/2020 06/21/2020  WBC 4.0 - 10.5 K/uL 12.3(H) 16.8(H) 20.6(H)  Hemoglobin 13.0 - 17.0 g/dL 7.8(L) 8.1(L) 8.1(L)  Hematocrit 39.0 - 52.0 % 27.6(L) 27.5(L) 27.7(L)  Platelets 150 - 400 K/uL 209 268 269    ABG    Component Value Date/Time   PHART 7.289 (L) 06/18/2020 0205   PCO2ART 62.6 (H) 06/18/2020 0205   PO2ART 65 (L) 06/18/2020 0205   HCO3 29.9 (H) 06/18/2020  0205   TCO2 32 06/18/2020 0205   ACIDBASEDEF 2.0 06/13/2020 1221   O2SAT 89.0 06/18/2020 0205    CBG (last 3)  Recent Labs    06/22/20 2333 06/23/20 0409 06/23/20 0756  GLUCAP 180* 140* 89   Critical care time:    The patient is critically ill with multiple organ systems failure and requires high complexity decision making for assessment and support, frequent evaluation and titration of therapies, application of advanced monitoring technologies and extensive interpretation of multiple databases.  Independent Critical Care Time: 35 Minutes.   Rodman Pickle, M.D. [facility] Pulmonary/Critical Care Medicine 06/23/2020 11:08 AM   Please see Amion for pager number to reach on-call Pulmonary and Critical Care Team.

## 2020-06-23 NOTE — Progress Notes (Signed)
Around 0430, pt had a watery BM.... pt dressing changed w/NS kerlex, abd pad, tape

## 2020-06-23 NOTE — Progress Notes (Signed)
ANTICOAGULATION CONSULT NOTE - Follow Up Consult  Pharmacy Consult for Heparin Indication: atrial fibrillation  Patient Measurements: Height: 5\' 10"  (177.8 cm) Weight: (!) 276.7 kg (610 lb) IBW/kg (Calculated) : 73 Heparin Dosing Weight: 149.7 kg  Vital Signs: Temp: 97.9 F (36.6 C) (01/23 2006) Temp Source: Temporal (01/23 2006) BP: 116/48 (01/23 1118) Pulse Rate: 104 (01/23 2006)  Labs: Recent Labs    06/21/20 0425 06/21/20 1907 06/22/20 0421 06/22/20 1239 06/23/20 0432 06/23/20 0433 06/23/20 1128 06/23/20 1204 06/23/20 2008  HGB 8.1*  --  8.1*  --  7.8*  --   --  8.5*  --   HCT 27.7*  --  27.5*  --  27.6*  --   --  25.0*  --   PLT 269  --  268  --  209  --   --   --   --   HEPARINUNFRC 0.38   < > 0.22*   < >  --  0.87* 0.89*  --  0.70  CREATININE 0.98  --  1.08  --  1.20  --   --   --   --    < > = values in this interval not displayed.    Estimated Creatinine Clearance: 164.5 mL/min (by C-G formula based on SCr of 1.2 mg/dL).   Assessment: 49YOM admitted with septic shock secondary to necrotizing fascitis, on heparin for afib.  The patient is requiring two heparin bags to achieve a therapeutic rate - this has been double checked and verified by pharmacy. CBC stable. Per RN no issues with bleeding or infusion.   Heparin level this evening is at upper end of goal range.  No overt bleeding or complications noted.  Goal of Therapy:  Heparin level 0.3-0.7 units/ml Monitor platelets by anticoagulation protocol: Yes   Plan:  - Reduce Heparin to 2800 units/hr (RN running 2 bags - each will be at a rate of 1900 units/hr).  - Will continue to monitor for any signs/symptoms of bleeding and will follow up with heparin level with morning labs. - Once therapeutic - will adjust back to twice daily checks with high Heparin rate (ordered to start on 1/23 AM)  Thank you for allowing pharmacy to be a part of this patient's care.  2/23, Reece Leader,  BCCP Clinical Pharmacist  06/23/2020 8:55 PM   [facility] pharmacy phone numbers are listed on [email or web address]

## 2020-06-23 NOTE — Progress Notes (Signed)
ANTICOAGULATION CONSULT NOTE - Follow Up Consult  Pharmacy Consult for heparin Indication: atrial fibrillation  Labs: Recent Labs    06/21/20 0425 06/21/20 1907 06/22/20 0421 06/22/20 1239 06/22/20 1840 06/23/20 0432 06/23/20 0433  HGB 8.1*  --  8.1*  --   --  7.8*  --   HCT 27.7*  --  27.5*  --   --  27.6*  --   PLT 269  --  268  --   --  209  --   HEPARINUNFRC 0.38   < > 0.22* 0.48 0.53  --  0.87*  CREATININE 0.98  --  1.08  --   --   --   --    < > = values in this interval not displayed.    Assessment: 50yo male now supratherapeutic on heparin after two levels at goal; no gtt issues or signs of bleeding per RN.  Goal of Therapy:  Heparin level 0.3-0.7 units/ml   Plan:  Will decrease heparin gtt by 10% to 4400 units/hr and check level in 6 hours.    Vernard Gambles, PharmD, BCPS  06/23/2020,5:35 AM

## 2020-06-23 NOTE — Progress Notes (Signed)
Subjective: No new complaints   Antibiotics:  Anti-infectives (From admission, onward)   Start     Dose/Rate Route Frequency Ordered Stop   06/22/20 1415  meropenem (MERREM) 2 g in sodium chloride 0.9 % 100 mL IVPB        2 g 200 mL/hr over 30 Minutes Intravenous Every 8 hours 06/22/20 1324     06/22/20 1400  meropenem (MERREM) 1 g in sodium chloride 0.9 % 100 mL IVPB  Status:  Discontinued        1 g 200 mL/hr over 30 Minutes Intravenous Every 8 hours 06/22/20 1248 06/22/20 1324   06/22/20 1300  doxycycline (VIBRAMYCIN) 100 mg in sodium chloride 0.9 % 250 mL IVPB  Status:  Discontinued        100 mg 125 mL/hr over 120 Minutes Intravenous Every 12 hours 06/22/20 1212 06/22/20 1248   06/20/20 1100  vancomycin (VANCOCIN) 2,500 mg in sodium chloride 0.9 % 500 mL IVPB  Status:  Discontinued        2,500 mg 250 mL/hr over 120 Minutes Intravenous Every 12 hours 06/20/20 1007 06/22/20 0813   06/20/20 1100  ceFEPIme (MAXIPIME) 2 g in sodium chloride 0.9 % 100 mL IVPB  Status:  Discontinued        2 g 200 mL/hr over 30 Minutes Intravenous Every 8 hours 06/20/20 1007 06/22/20 1207   06/17/20 1130  metroNIDAZOLE (FLAGYL) IVPB 500 mg  Status:  Discontinued        500 mg 100 mL/hr over 60 Minutes Intravenous Every 8 hours 06/17/20 1041 06/22/20 1248   06/13/20 1400  cefTRIAXone (ROCEPHIN) 2 g in sodium chloride 0.9 % 100 mL IVPB  Status:  Discontinued        2 g 200 mL/hr over 30 Minutes Intravenous Every 24 hours 06/13/20 1242 06/20/20 0944   06/11/20 1300  vancomycin (VANCOREADY) IVPB 2000 mg/400 mL        2,000 mg 200 mL/hr over 120 Minutes Intravenous  Once 06/11/20 1212 06/11/20 1824   06/10/20 2000  clindamycin (CLEOCIN) IVPB 900 mg        900 mg 100 mL/hr over 30 Minutes Intravenous Every 8 hours 06/10/20 1307 06/13/20 1500   06/10/20 1600  ceFEPIme (MAXIPIME) 2 g in sodium chloride 0.9 % 100 mL IVPB  Status:  Discontinued        2 g 200 mL/hr over 30 Minutes Intravenous  Every 12 hours 06/10/20 1504 06/13/20 1242   06/10/20 1400  metroNIDAZOLE (FLAGYL) IVPB 500 mg  Status:  Discontinued        500 mg 100 mL/hr over 60 Minutes Intravenous Every 8 hours 06/10/20 1023 06/10/20 1308   06/10/20 1300  aztreonam (AZACTAM) 2 g in sodium chloride 0.9 % 100 mL IVPB  Status:  Discontinued        2 g 200 mL/hr over 30 Minutes Intravenous Every 8 hours 06/10/20 1033 06/10/20 1504   06/10/20 1215  clindamycin (CLEOCIN) IVPB 900 mg        900 mg 100 mL/hr over 30 Minutes Intravenous To Surgery 06/10/20 1210 06/10/20 1210   06/10/20 1033  vancomycin variable dose per unstable renal function (pharmacist dosing)  Status:  Discontinued         Does not apply See admin instructions 06/10/20 1033 06/12/20 1252   06/10/20 0600  metroNIDAZOLE (FLAGYL) tablet 500 mg  Status:  Discontinued        500 mg Oral Every 8  hours 06/10/20 0406 06/10/20 0948   06/10/20 0415  aztreonam (AZACTAM) 2 g in sodium chloride 0.9 % 100 mL IVPB        2 g 200 mL/hr over 30 Minutes Intravenous  Once 06/10/20 0406 06/10/20 0546   06/10/20 0415  vancomycin (VANCOCIN) IVPB 1000 mg/200 mL premix  Status:  Discontinued        1,000 mg 200 mL/hr over 60 Minutes Intravenous  Once 06/10/20 0406 06/10/20 0412   06/10/20 0415  vancomycin (VANCOCIN) 2,500 mg in sodium chloride 0.9 % 500 mL IVPB        2,500 mg 250 mL/hr over 120 Minutes Intravenous  Once 06/10/20 0412 06/10/20 0903      Medications: Scheduled Meds: . amiodarone  200 mg Per Tube Daily  . atorvastatin  20 mg Per Tube QPM  . chlorhexidine gluconate (MEDLINE KIT)  15 mL Mouth Rinse BID  . Chlorhexidine Gluconate Cloth  6 each Topical Daily  . docusate  100 mg Per Tube BID  . feeding supplement (PROSource TF)  90 mL Per Tube QID  . free water  400 mL Per Tube Q8H  . insulin aspart  0-20 Units Subcutaneous Q4H  . insulin aspart  15 Units Subcutaneous Q4H  . insulin detemir  40 Units Subcutaneous Q12H  . levothyroxine  75 mcg Per Tube Daily   . mouth rinse  15 mL Mouth Rinse 10 times per day  . nortriptyline  25 mg Per Tube BID  . pantoprazole sodium  40 mg Per Tube Q24H  . polyethylene glycol  17 g Per Tube BID  . pregabalin  300 mg Per Tube BID  . sennosides  10 mL Per Tube BID  . sodium chloride flush  10-40 mL Intracatheter Q12H  . sodium chloride flush  3 mL Intravenous Q12H   Continuous Infusions: . sodium chloride    . feeding supplement (PIVOT 1.5 CAL) 70 mL/hr at 06/23/20 1005  . fentaNYL infusion INTRAVENOUS 125 mcg/hr (06/23/20 1300)  . heparin 2,200 Units/hr (06/23/20 1300)   And  . heparin 2,200 Units/hr (06/23/20 1300)  . meropenem (MERREM) IV 2 g (06/23/20 1309)  . norepinephrine (LEVOPHED) Adult infusion Stopped (06/22/20 0945)  . phenylephrine (NEO-SYNEPHRINE) Adult infusion Stopped (06/22/20 0940)  . propofol (DIPRIVAN) infusion 35 mcg/kg/min (06/20/20 0859)   PRN Meds:.sodium chloride, acetaminophen (TYLENOL) oral liquid 160 mg/5 mL, albuterol, bisacodyl, fentaNYL (SUBLIMAZE) injection, midazolam, [DISCONTINUED] ondansetron **OR** ondansetron (ZOFRAN) IV, sodium chloride flush    Objective: Weight change: -18.6 kg  Intake/Output Summary (Last 24 hours) at 06/23/2020 1512 Last data filed at 06/23/2020 1312 Gross per 24 hour  Intake 2653.09 ml  Output 7130 ml  Net -4476.91 ml   Blood pressure (!) 116/48, pulse (!) 113, temperature 98.4 F (36.9 C), temperature source Esophageal, resp. rate (!) 22, height _0  (1.778 m), weight (!) 276.7 kg, SpO2 95 %. Temp:  [98 F (36.7 C)-101.1 F (38.4 C)] 98.4 F (36.9 C) (01/23 1200) Pulse Rate:  [106-130] 113 (01/23 1118) Resp:  [12-24] 22 (01/23 1300) BP: (114-116)/(48) 116/48 (01/23 1118) SpO2:  [91 %-100 %] 95 % (01/23 1300) Arterial Line BP: (92-126)/(42-63) 120/52 (01/23 1300) FiO2 (%):  [100 %] 100 % (01/23 1118) Weight:  [276.7 kg] 276.7 kg (01/23 0500)  Physical Exam: Physical Exam Constitutional:      Appearance: He is ill-appearing.      Interventions: He is intubated.  HENT:     Head: Normocephalic and atraumatic.  Cardiovascular:  Rate and Rhythm: Tachycardia present.  Pulmonary:     Effort: He is intubated.  Musculoskeletal:     Right lower leg: Edema present.     Left lower leg: Edema present.  Skin:    Coloration: Skin is pale.  Neurological:     General: No focal deficit present.      Perineal wound from pictures yesterday      CBC:    BMET Recent Labs    06/22/20 0421 06/23/20 0432 06/23/20 1204  NA 153* 156* 160*  K 4.1 3.2* 3.5  CL 118* 116*  --   CO2 26 31  --   GLUCOSE 176* 145*  --   BUN 39* 37*  --   CREATININE 1.08 1.20  --   CALCIUM 7.8* 7.7*  --      Liver Panel  No results for input(s): PROT, ALBUMIN, AST, ALT, ALKPHOS, BILITOT, BILIDIR, IBILI in the last 72 hours.     Sedimentation Rate No results for input(s): ESRSEDRATE in the last 72 hours. C-Reactive Protein No results for input(s): CRP in the last 72 hours.  Micro Results: Recent Results (from the past 720 hour(s))  SARS CORONAVIRUS 2 (TAT 6-24 HRS) Nasopharyngeal Nasopharyngeal Swab     Status: None   Collection Time: 06/10/20  2:44 AM   Specimen: Nasopharyngeal Swab  Result Value Ref Range Status   SARS Coronavirus 2 NEGATIVE NEGATIVE Final    Comment: (NOTE) SARS-CoV-2 target nucleic acids are NOT DETECTED.  The SARS-CoV-2 RNA is generally detectable in upper and lower respiratory specimens during the acute phase of infection. Negative results do not preclude SARS-CoV-2 infection, do not rule out co-infections with other pathogens, and should not be used as the sole basis for treatment or other patient management decisions. Negative results must be combined with clinical observations, patient history, and epidemiological information. The expected result is Negative.  Fact Sheet for Patients: SugarRoll.be  Fact Sheet for Healthcare  Providers: https://www.woods-mathews.com/  This test is not yet approved or cleared by the [place] FDA and  has been authorized for detection and/or diagnosis of SARS-CoV-2 by FDA under an Emergency Use Authorization (EUA). This EUA will remain  in effect (meaning this test can be used) for the duration of the COVID-19 declaration under Se ction 564(b)(1) of the Act, 21 U.S.C. section 360bbb-3(b)(1), unless the authorization is terminated or revoked sooner.  Performed at [facility], [place] [address], [place], [place] [address]   Resp Panel by RT-PCR (Flu A&B, Covid) Nasopharyngeal Swab     Status: None   Collection Time: 06/10/20  2:44 AM   Specimen: Nasopharyngeal Swab; Nasopharyngeal(NP) swabs in vial transport medium  Result Value Ref Range Status   SARS Coronavirus 2 by RT PCR NEGATIVE NEGATIVE Final    Comment: (NOTE) SARS-CoV-2 target nucleic acids are NOT DETECTED.  The SARS-CoV-2 RNA is generally detectable in upper respiratory specimens during the acute phase of infection. The lowest concentration of SARS-CoV-2 viral copies this assay can detect is 138 copies/mL. A negative result does not preclude SARS-Cov-2 infection and should not be used as the sole basis for treatment or other patient management decisions. A negative result may occur with  improper specimen collection/handling, submission of specimen other than nasopharyngeal swab, presence of viral mutation(s) within the areas targeted by this assay, and inadequate number of viral copies(<138 copies/mL). A negative result must be combined with clinical observations, patient history, and epidemiological information. The expected result is Negative.  Fact Sheet  for Patients:  EntrepreneurPulse.com.au  Fact Sheet for Healthcare Providers:  IncredibleEmployment.be  This test is no t yet approved or cleared by the [place] FDA and  has been authorized  for detection and/or diagnosis of SARS-CoV-2 by FDA under an Emergency Use Authorization (EUA). This EUA will remain  in effect (meaning this test can be used) for the duration of the COVID-19 declaration under Section 564(b)(1) of the Act, 21 U.S.C.section 360bbb-3(b)(1), unless the authorization is terminated  or revoked sooner.       Influenza A by PCR NEGATIVE NEGATIVE Final   Influenza B by PCR NEGATIVE NEGATIVE Final    Comment: (NOTE) The Xpert Xpress SARS-CoV-2/FLU/RSV plus assay is intended as an aid in the diagnosis of influenza from Nasopharyngeal swab specimens and should not be used as a sole basis for treatment. Nasal washings and aspirates are unacceptable for Xpert Xpress SARS-CoV-2/FLU/RSV testing.  Fact Sheet for Patients: EntrepreneurPulse.com.au  Fact Sheet for Healthcare Providers: IncredibleEmployment.be  This test is not yet approved or cleared by the [place] FDA and has been authorized for detection and/or diagnosis of SARS-CoV-2 by FDA under an Emergency Use Authorization (EUA). This EUA will remain in effect (meaning this test can be used) for the duration of the COVID-19 declaration under Section 564(b)(1) of the Act, 21 U.S.C. section 360bbb-3(b)(1), unless the authorization is terminated or revoked.  Performed at [facility], [place] [address] [place], [place] [address]   Culture, blood (single)     Status: None   Collection Time: 06/10/20  4:04 AM   Specimen: BLOOD  Result Value Ref Range Status   Specimen Description BLOOD LEFT ANTECUBITAL  Final   Special Requests   Final    BOTTLES DRAWN AEROBIC AND ANAEROBIC Blood Culture adequate volume   Culture   Final    NO GROWTH 5 DAYS Performed at [facility], [place] [address], [place], [place] [address]    Report Status 06/15/2020 FINAL  Final  Aerobic/Anaerobic Culture (surgical/deep wound)     Status: None   Collection Time: 06/10/20  1:18 PM    Specimen: PATH Other; Tissue  Result Value Ref Range Status   Specimen Description TISSUE  Final   Special Requests LEFT GLUTEAL SPEC 1  Final   Gram Stain   Final    FEW WBC PRESENT, PREDOMINANTLY PMN ABUNDANT GRAM POSITIVE COCCI MODERATE GRAM NEGATIVE RODS    Culture   Final    RARE GROUP B STREP(S.AGALACTIAE)ISOLATED TESTING AGAINST S. AGALACTIAE NOT ROUTINELY PERFORMED DUE TO PREDICTABILITY OF AMP/PEN/VAN SUSCEPTIBILITY. FEW ACTINOMYCES SPECIES ABUNDANT PEPTOSTREPTOCOCCUS SPECIES Standardized susceptibility testing for this organism is not available. Performed at [facility], [place] [address] [place], [place] [address]    Report Status 06/16/2020 FINAL  Final  Aerobic/Anaerobic Culture (surgical/deep wound)     Status: None   Collection Time: 06/10/20  1:25 PM   Specimen: PATH Other; Tissue  Result Value Ref Range Status   Specimen Description TISSUE  Final   Special Requests RIGHT GLUTEAL SPEC 2  Final   Gram Stain   Final    NO WBC SEEN ABUNDANT GRAM POSITIVE COCCI FEW GRAM NEGATIVE RODS Performed at [facility], [address] [place], [place] [address]    Culture   Final    RARE ESCHERICHIA COLI RARE GROUP B STREP(S.AGALACTIAE)ISOLATED TESTING AGAINST S. AGALACTIAE NOT ROUTINELY PERFORMED DUE TO PREDICTABILITY OF AMP/PEN/VAN SUSCEPTIBILITY. RARE STREPTOCOCCUS INFANTARIUS RARE STREPTOCOCCUS CONSTELLATUS ABUNDANT PEPTOSTREPTOCOCCUS SPECIES  Report Status 06/16/2020 FINAL  Final   Organism ID, Bacteria ESCHERICHIA COLI  Final   Organism ID, Bacteria STREPTOCOCCUS INFANTARIUS  Final      Susceptibility   Escherichia coli - MIC*    AMPICILLIN <=2 SENSITIVE Sensitive     CEFAZOLIN <=4 SENSITIVE Sensitive     CEFEPIME <=0.12 SENSITIVE Sensitive     CEFTAZIDIME <=1 SENSITIVE Sensitive     CEFTRIAXONE <=0.25 SENSITIVE Sensitive     CIPROFLOXACIN <=0.25 SENSITIVE Sensitive     GENTAMICIN <=1 SENSITIVE Sensitive     IMIPENEM <=0.25 SENSITIVE Sensitive      TRIMETH/SULFA <=20 SENSITIVE Sensitive     AMPICILLIN/SULBACTAM <=2 SENSITIVE Sensitive     PIP/TAZO <=4 SENSITIVE Sensitive     * RARE ESCHERICHIA COLI   Streptococcus infantarius - MIC*    PENICILLIN <=0.06 SENSITIVE Sensitive     CEFTRIAXONE <=0.12 SENSITIVE Sensitive     LEVOFLOXACIN 4 INTERMEDIATE Intermediate     VANCOMYCIN 0.5 SENSITIVE Sensitive     * RARE STREPTOCOCCUS INFANTARIUS  MRSA PCR Screening     Status: None   Collection Time: 06/13/20  2:38 PM   Specimen: Nasal Mucosa; Nasopharyngeal  Result Value Ref Range Status   MRSA by PCR NEGATIVE NEGATIVE Final    Comment:        The GeneXpert MRSA Assay (FDA approved for NASAL specimens only), is one component of a comprehensive MRSA colonization surveillance program. It is not intended to diagnose MRSA infection nor to guide or monitor treatment for MRSA infections. Performed at [facility], [place] [address] [place], [place] [address]   Culture, bal-quantitative     Status: Abnormal   Collection Time: 06/19/20  5:19 PM   Specimen: Bronchoalveolar Lavage; Respiratory  Result Value Ref Range Status   Specimen Description BRONCHIAL ALVEOLAR LAVAGE  Final   Special Requests Normal  Final   Gram Stain   Final    FEW WBC PRESENT, PREDOMINANTLY PMN NO ORGANISMS SEEN    Culture (A)  Final    1,000 COLONIES/mL Consistent with normal respiratory flora. Performed at [facility], [place] [address] [place], [place] [address]    Report Status 06/21/2020 FINAL  Final    Studies/Results: CT CHEST ABDOMEN PELVIS W CONTRAST  Result Date: 06/22/2020 CLINICAL DATA:  Fever. Evaluate for intra-abdominal abscess. Morbid obesity. EXAM: CT CHEST, ABDOMEN, AND PELVIS WITH CONTRAST TECHNIQUE: Multidetector CT imaging of the chest, abdomen and pelvis was performed following the standard protocol during bolus administration of intravenous contrast. CONTRAST:  135m OMNIPAQUE IOHEXOL 300 MG/ML  SOLN COMPARISON:  Chest  radiographs, most recent 1 day prior. pelvic CT 06/10/2020. FINDINGS: Severely limited exam secondary to patient body habitus. CT CHEST FINDINGS Cardiovascular: Right internal jugular line tip at high SVC. Grossly normal aortic caliber. Moderate cardiomegaly. Pulmonary artery enlargement, outflow tract 3.2 cm. Mediastinum/Nodes: Extremely limited evaluation for thoracic adenopathy. Lungs/Pleura: Small bilateral pleural effusions. Endotracheal tube well above the carina. Right lower and right middle lobe consolidation. Mild dependent atelectasis in the left lower lobe. Left greater than right upper lobe ground-glass and less so airspace disease. No pneumothorax. Musculoskeletal: Mild right-sided gynecomastia. CT ABDOMEN PELVIS FINDINGS Hepatobiliary: Grossly normal liver. Gallbladder not well evaluated. Pancreas: No gross pancreatic abnormality. Spleen: Spleen grossly within normal limits. Adrenals/Urinary Tract: Normal adrenal glands. No gross hydronephrosis. Bladder decompressed around a Foley catheter. Stomach/Bowel: Nasogastric tube terminating at the gastric body. Normal caliber of bowel loops. No pneumatosis or free intraperitoneal air. Vascular/Lymphatic: Normal aortic caliber.  Limited evaluation for abdominopelvic adenopathy. Reproductive: Grossly normal prostate. Other: Interval debridement of previously described perineal gas. Packing material identified about the gluteal crease. No well-defined residual or recurrent fluid collection. Musculoskeletal: No gross osseous abnormality. IMPRESSION: 1. Severely degraded exam secondary to patient body habitus. 2. Small bilateral pleural effusions with relatively diffuse pulmonary opacities as detailed above. Favor infection and/or aspiration. 3. Interval debridement of previously described perineal infection. No gross residual fluid collection and no evidence of residual soft tissue gas. 4. Pulmonary artery enlargement suggests pulmonary arterial hypertension.  Electronically Signed   By: Abigail Miyamoto M.D.   On: 06/22/2020 15:36      Assessment/Plan:  INTERVAL HISTORY: fevers defervesced after we had turned pt and placed cooling blanket UNDER neath him again   Principal Problem:   Fournier gangrene Active Problems:   Severe sepsis with septic shock (HCC)   DKA (diabetic ketoacidosis) (HCC)   AKI (acute kidney injury) ([place])   Diabetic ulcer of heel ([place])   Atrial fibrillation, chronic ([place])   Essential hypertension   Dyslipidemia   Acquired hypothyroidism   Chronic pain disorder   Morbid obesity with BMI of 60.0-69.9, adult ([place])   Acute respiratory failure ([place])   Sepsis due to Streptococcus, group B ([place])   Actinomycosis    Carl Hill is a 50 y.o. male with polymicrobial Fournier's gangrene with septic shock status post debridement by surgery twice who had a high fever to 105 degrees yesterday.  He was imaged and has now been reevaluated by general surgery.  His fevers have defervesced after we placed the cooling blanket underneath him again.  Apparently the cooling blanket had been dislodged on the night prior to his high temperature of 105 degrees.  Continue meropenem  Continue vigilant monitoring of the wound  Consider penicillin allergy testing during the week.   LOS: 13 days   Alcide Evener 06/23/2020, 3:12 PM

## 2020-06-23 NOTE — Progress Notes (Addendum)
Patient remains off pressors, white count downtrending, tachycardia somewhat improved, fever resolved at this point.  I have asked patient's nurse to contact surgery service when dressing change occurs today so that we can further evaluate the wound.   Addendum 10 AM: Wound inspected.  Generally all tissue appears viable and well perfused.  There is 1 superficial area of necrosis approximately 5 cm in diameter just posterior and left of the anus, underlying tissue has good integrity and there is no fluctuance or purulent drainage.  This does not require further surgical debridement.  I did disimpact several more large stool balls from the rectum during this exam.  We will reassess wound tomorrow.

## 2020-06-24 DIAGNOSIS — R6521 Severe sepsis with septic shock: Secondary | ICD-10-CM | POA: Diagnosis not present

## 2020-06-24 DIAGNOSIS — A401 Sepsis due to streptococcus, group B: Secondary | ICD-10-CM | POA: Diagnosis not present

## 2020-06-24 DIAGNOSIS — J9601 Acute respiratory failure with hypoxia: Secondary | ICD-10-CM | POA: Diagnosis not present

## 2020-06-24 DIAGNOSIS — G894 Chronic pain syndrome: Secondary | ICD-10-CM

## 2020-06-24 DIAGNOSIS — M726 Necrotizing fasciitis: Secondary | ICD-10-CM | POA: Diagnosis not present

## 2020-06-24 DIAGNOSIS — J9602 Acute respiratory failure with hypercapnia: Secondary | ICD-10-CM | POA: Diagnosis not present

## 2020-06-24 DIAGNOSIS — N493 Fournier gangrene: Secondary | ICD-10-CM | POA: Diagnosis not present

## 2020-06-24 LAB — BASIC METABOLIC PANEL
Anion gap: 10 (ref 5–15)
BUN: 35 mg/dL — ABNORMAL HIGH (ref 6–20)
CO2: 33 mmol/L — ABNORMAL HIGH (ref 22–32)
Calcium: 8.1 mg/dL — ABNORMAL LOW (ref 8.9–10.3)
Chloride: 116 mmol/L — ABNORMAL HIGH (ref 98–111)
Creatinine, Ser: 1.06 mg/dL (ref 0.61–1.24)
GFR, Estimated: >60 mL/min (ref >60)
Glucose, Bld: 171 mg/dL — ABNORMAL HIGH (ref 70–99)
Potassium: 3.4 mmol/L — ABNORMAL LOW (ref 3.5–5.1)
Sodium: 159 mmol/L — ABNORMAL HIGH (ref 135–145)

## 2020-06-24 LAB — TRIGLYCERIDES: Triglycerides: 110 mg/dL (ref <150)

## 2020-06-24 LAB — RENAL FUNCTION PANEL
Albumin: 1.6 g/dL — ABNORMAL LOW (ref 3.5–5.0)
Anion gap: 9 (ref 5–15)
BUN: 39 mg/dL — ABNORMAL HIGH (ref 6–20)
CO2: 33 mmol/L — ABNORMAL HIGH (ref 22–32)
Calcium: 8 mg/dL — ABNORMAL LOW (ref 8.9–10.3)
Chloride: 115 mmol/L — ABNORMAL HIGH (ref 98–111)
Creatinine, Ser: 1.11 mg/dL (ref 0.61–1.24)
GFR, Estimated: >60 mL/min (ref >60)
Glucose, Bld: 222 mg/dL — ABNORMAL HIGH (ref 70–99)
Phosphorus: 4.6 mg/dL (ref 2.5–4.6)
Potassium: 4.1 mmol/L (ref 3.5–5.1)
Sodium: 157 mmol/L — ABNORMAL HIGH (ref 135–145)

## 2020-06-24 LAB — CBC
HCT: 30.6 % — ABNORMAL LOW (ref 39.0–52.0)
Hemoglobin: 8.4 g/dL — ABNORMAL LOW (ref 13.0–17.0)
MCH: 27.5 pg (ref 26.0–34.0)
MCHC: 27.5 g/dL — ABNORMAL LOW (ref 30.0–36.0)
MCV: 100 fL (ref 80.0–100.0)
Platelets: 219 10*3/uL (ref 150–400)
RBC: 3.06 MIL/uL — ABNORMAL LOW (ref 4.22–5.81)
RDW: 18.1 % — ABNORMAL HIGH (ref 11.5–15.5)
WBC: 10.4 10*3/uL (ref 4.0–10.5)
nRBC: 1.4 % — ABNORMAL HIGH (ref 0.0–0.2)

## 2020-06-24 LAB — HEPARIN LEVEL (UNFRACTIONATED)
Heparin Unfractionated: 0.52 IU/mL (ref 0.30–0.70)
Heparin Unfractionated: 0.51 IU/mL (ref 0.30–0.70)

## 2020-06-24 LAB — GLUCOSE, CAPILLARY
Glucose-Capillary: 139 mg/dL — ABNORMAL HIGH (ref 70–99)
Glucose-Capillary: 145 mg/dL — ABNORMAL HIGH (ref 70–99)
Glucose-Capillary: 147 mg/dL — ABNORMAL HIGH (ref 70–99)
Glucose-Capillary: 158 mg/dL — ABNORMAL HIGH (ref 70–99)
Glucose-Capillary: 180 mg/dL — ABNORMAL HIGH (ref 70–99)
Glucose-Capillary: 189 mg/dL — ABNORMAL HIGH (ref 70–99)
Glucose-Capillary: 194 mg/dL — ABNORMAL HIGH (ref 70–99)

## 2020-06-24 LAB — MAGNESIUM: Magnesium: 2 mg/dL (ref 1.7–2.4)

## 2020-06-24 MED ORDER — DIPHENHYDRAMINE HCL 50 MG/ML IJ SOLN: 25.0000 mg | Freq: Once | INTRAMUSCULAR | Status: DC | PRN

## 2020-06-24 MED ORDER — AMOXICILLIN 500 MG PO CAPS
500.0000 mg | ORAL_CAPSULE | Freq: Once | ORAL | Status: DC
  Filled 2020-06-24: qty 1

## 2020-06-24 MED ORDER — FREE WATER
400.0000 mL | Status: DC
  Administered 2020-06-24 – 2020-07-02 (×46): 400 mL

## 2020-06-24 MED ORDER — INSULIN ASPART 100 UNIT/ML ~~LOC~~ SOLN
17.0000 [IU] | SUBCUTANEOUS | Status: DC
  Administered 2020-06-24 – 2020-06-30 (×35): 17 [IU] via SUBCUTANEOUS

## 2020-06-24 MED ORDER — INSULIN ASPART 100 UNIT/ML ~~LOC~~ SOLN: 17.0000 [IU] | SUBCUTANEOUS | Status: DC

## 2020-06-24 MED ORDER — EPINEPHRINE 0.3 MG/0.3ML IJ SOAJ
0.3000 mg | Freq: Once | INTRAMUSCULAR | Status: DC | PRN
  Filled 2020-06-24: qty 0.6

## 2020-06-24 MED ORDER — DEXTROSE 5 % IV SOLN: INTRAVENOUS | Status: DC

## 2020-06-24 MED ORDER — AMOXICILLIN 500 MG PO CAPS
500.0000 mg | ORAL_CAPSULE | Freq: Once | ORAL | Status: AC
  Administered 2020-06-24: 500 mg via ORAL
  Filled 2020-06-24: qty 1

## 2020-06-24 MED ORDER — SODIUM CHLORIDE 0.9 % IV SOLN
3.0000 g | Freq: Four times a day (QID) | INTRAVENOUS | Status: DC
  Administered 2020-06-24 – 2020-06-27 (×11): 3 g via INTRAVENOUS
  Filled 2020-06-24 (×12): qty 8

## 2020-06-24 MED ORDER — POTASSIUM CHLORIDE 20 MEQ PO PACK
40.0000 meq | PACK | Freq: Once | ORAL | Status: AC
  Administered 2020-06-24: 40 meq
  Filled 2020-06-24: qty 2

## 2020-06-24 MED FILL — Sodium Chloride IV Soln 0.9%: INTRAVENOUS | Qty: 250 | Status: AC

## 2020-06-24 MED FILL — Phenylephrine HCl IV Soln 10 MG/ML: INTRAVENOUS | Qty: 10 | Status: AC

## 2020-06-24 NOTE — Progress Notes (Signed)
AM K+ 3.4 with creat 1.06 and GFR > 60. ELink CCM electrolyte protocol initiated.

## 2020-06-24 NOTE — Progress Notes (Signed)
Patient with hypotension 86/31 on arterial line, art line re-leveled and zeroed to confirm result. Notified E-link of hypotension while Levophed restarted.

## 2020-06-24 NOTE — Progress Notes (Signed)
Subjective: No new complaints   Antibiotics:  Anti-infectives (From admission, onward)   Start     Dose/Rate Route Frequency Ordered Stop   06/24/20 1300  amoxicillin (AMOXIL) capsule 500 mg        500 mg Oral  Once 06/24/20 1241 06/24/20 1256   06/24/20 1115  amoxicillin (AMOXIL) capsule 500 mg  Status:  Discontinued        500 mg Oral  Once 06/24/20 1025 06/24/20 1241   06/22/20 1415  meropenem (MERREM) 2 g in sodium chloride 0.9 % 100 mL IVPB        2 g 200 mL/hr over 30 Minutes Intravenous Every 8 hours 06/22/20 1324     06/22/20 1400  meropenem (MERREM) 1 g in sodium chloride 0.9 % 100 mL IVPB  Status:  Discontinued        1 g 200 mL/hr over 30 Minutes Intravenous Every 8 hours 06/22/20 1248 06/22/20 1324   06/22/20 1300  doxycycline (VIBRAMYCIN) 100 mg in sodium chloride 0.9 % 250 mL IVPB  Status:  Discontinued        100 mg 125 mL/hr over 120 Minutes Intravenous Every 12 hours 06/22/20 1212 06/22/20 1248   06/20/20 1100  vancomycin (VANCOCIN) 2,500 mg in sodium chloride 0.9 % 500 mL IVPB  Status:  Discontinued        2,500 mg 250 mL/hr over 120 Minutes Intravenous Every 12 hours 06/20/20 1007 06/22/20 0813   06/20/20 1100  ceFEPIme (MAXIPIME) 2 g in sodium chloride 0.9 % 100 mL IVPB  Status:  Discontinued        2 g 200 mL/hr over 30 Minutes Intravenous Every 8 hours 06/20/20 1007 06/22/20 1207   06/17/20 1130  metroNIDAZOLE (FLAGYL) IVPB 500 mg  Status:  Discontinued        500 mg 100 mL/hr over 60 Minutes Intravenous Every 8 hours 06/17/20 1041 06/22/20 1248   06/13/20 1400  cefTRIAXone (ROCEPHIN) 2 g in sodium chloride 0.9 % 100 mL IVPB  Status:  Discontinued        2 g 200 mL/hr over 30 Minutes Intravenous Every 24 hours 06/13/20 1242 06/20/20 0944   06/11/20 1300  vancomycin (VANCOREADY) IVPB 2000 mg/400 mL        2,000 mg 200 mL/hr over 120 Minutes Intravenous  Once 06/11/20 1212 06/11/20 1824   06/10/20 2000  clindamycin (CLEOCIN) IVPB 900 mg        900  mg 100 mL/hr over 30 Minutes Intravenous Every 8 hours 06/10/20 1307 06/13/20 1500   06/10/20 1600  ceFEPIme (MAXIPIME) 2 g in sodium chloride 0.9 % 100 mL IVPB  Status:  Discontinued        2 g 200 mL/hr over 30 Minutes Intravenous Every 12 hours 06/10/20 1504 06/13/20 1242   06/10/20 1400  metroNIDAZOLE (FLAGYL) IVPB 500 mg  Status:  Discontinued        500 mg 100 mL/hr over 60 Minutes Intravenous Every 8 hours 06/10/20 1023 06/10/20 1308   06/10/20 1300  aztreonam (AZACTAM) 2 g in sodium chloride 0.9 % 100 mL IVPB  Status:  Discontinued        2 g 200 mL/hr over 30 Minutes Intravenous Every 8 hours 06/10/20 1033 06/10/20 1504   06/10/20 1215  clindamycin (CLEOCIN) IVPB 900 mg        900 mg 100 mL/hr over 30 Minutes Intravenous To Surgery 06/10/20 1210 06/10/20 1210   06/10/20 1033  vancomycin  variable dose per unstable renal function (pharmacist dosing)  Status:  Discontinued         Does not apply See admin instructions 06/10/20 1033 06/12/20 1252   06/10/20 0600  metroNIDAZOLE (FLAGYL) tablet 500 mg  Status:  Discontinued        500 mg Oral Every 8 hours 06/10/20 0406 06/10/20 0948   06/10/20 0415  aztreonam (AZACTAM) 2 g in sodium chloride 0.9 % 100 mL IVPB        2 g 200 mL/hr over 30 Minutes Intravenous  Once 06/10/20 0406 06/10/20 0546   06/10/20 0415  vancomycin (VANCOCIN) IVPB 1000 mg/200 mL premix  Status:  Discontinued        1,000 mg 200 mL/hr over 60 Minutes Intravenous  Once 06/10/20 0406 06/10/20 0412   06/10/20 0415  vancomycin (VANCOCIN) 2,500 mg in sodium chloride 0.9 % 500 mL IVPB        2,500 mg 250 mL/hr over 120 Minutes Intravenous  Once 06/10/20 0412 06/10/20 0903      Medications: Scheduled Meds: . amiodarone  200 mg Per Tube Daily  . atorvastatin  20 mg Per Tube QPM  . chlorhexidine gluconate (MEDLINE KIT)  15 mL Mouth Rinse BID  . Chlorhexidine Gluconate Cloth  6 each Topical Daily  . docusate  100 mg Per Tube BID  . feeding supplement (PROSource TF)   90 mL Per Tube QID  . free water  400 mL Per Tube Q4H  . insulin aspart  0-20 Units Subcutaneous Q4H  . insulin aspart  17 Units Subcutaneous Q4H  . insulin detemir  40 Units Subcutaneous Q12H  . levothyroxine  75 mcg Per Tube Daily  . mouth rinse  15 mL Mouth Rinse 10 times per day  . nortriptyline  25 mg Per Tube BID  . pantoprazole sodium  40 mg Per Tube Q24H  . polyethylene glycol  17 g Per Tube BID  . pregabalin  300 mg Per Tube BID  . sennosides  10 mL Per Tube BID  . sodium chloride flush  10-40 mL Intracatheter Q12H  . sodium chloride flush  3 mL Intravenous Q12H   Continuous Infusions: . sodium chloride    . dextrose    . feeding supplement (PIVOT 1.5 CAL) 1,000 mL (06/24/20 0517)  . fentaNYL infusion INTRAVENOUS 200 mcg/hr (06/24/20 1212)  . heparin 1,900 Units/hr (06/24/20 1237)   And  . heparin 1,900 Units/hr (06/24/20 1233)  . meropenem (MERREM) IV Stopped (06/24/20 0544)  . norepinephrine (LEVOPHED) Adult infusion Stopped (06/22/20 0945)  . phenylephrine (NEO-SYNEPHRINE) Adult infusion Stopped (06/22/20 0940)  . propofol (DIPRIVAN) infusion 35 mcg/kg/min (06/20/20 0859)   PRN Meds:.sodium chloride, acetaminophen (TYLENOL) oral liquid 160 mg/5 mL, albuterol, bisacodyl, diphenhydrAMINE, EPINEPHrine, fentaNYL (SUBLIMAZE) injection, midazolam, [DISCONTINUED] ondansetron **OR** ondansetron (ZOFRAN) IV, sodium chloride flush    Objective: Weight change: -2.722 kg  Intake/Output Summary (Last 24 hours) at 06/24/2020 1356 Last data filed at 06/24/2020 1330 Gross per 24 hour  Intake 3694.1 ml  Output 6221 ml  Net -2526.9 ml   Blood pressure (!) 116/48, pulse (!) 110, temperature 98.8 F (37.1 C), temperature source Core, resp. rate (!) 30, height _0  (1.778 m), weight (!) 274 kg, SpO2 95 %. Temp:  [97.9 F (36.6 C)-98.8 F (37.1 C)] 98.8 F (37.1 C) (01/24 1152) Pulse Rate:  [100-110] 110 (01/24 0400) Resp:  [7-42] 30 (01/24 1054) SpO2:  [85 %-98 %] 95 % (01/24  1054) Arterial Line BP: (97-161)/(39-64) 133/55 (01/24  1000) FiO2 (%):  [100 %] 100 % (01/24 1054) Weight:  [274 kg] 274 kg (01/24 0319)  Physical Exam: Physical Exam Constitutional:      Appearance: He is obese. He is ill-appearing.     Interventions: He is intubated.  HENT:     Head: Normocephalic and atraumatic.  Cardiovascular:     Rate and Rhythm: Tachycardia present.  Pulmonary:     Effort: He is intubated.  Musculoskeletal:     Right lower leg: Edema present.     Left lower leg: Edema present.  Skin:    Coloration: Skin is pale.  Neurological:     General: No focal deficit present.     Mental Status: He is alert.      Perineal wound from pictures Saturday     Wound from 06/24/2020 picture from General Surgery:      CBC:    BMET Recent Labs    06/23/20 0432 06/23/20 1204 06/24/20 0439  NA 156* 160* 159*  K 3.2* 3.5 3.4*  CL 116*  --  116*  CO2 31  --  33*  GLUCOSE 145*  --  171*  BUN 37*  --  35*  CREATININE 1.20  --  1.06  CALCIUM 7.7*  --  8.1*     Liver Panel  No results for input(s): PROT, ALBUMIN, AST, ALT, ALKPHOS, BILITOT, BILIDIR, IBILI in the last 72 hours.     Sedimentation Rate No results for input(s): ESRSEDRATE in the last 72 hours. C-Reactive Protein No results for input(s): CRP in the last 72 hours.  Micro Results: Recent Results (from the past 720 hour(s))  SARS CORONAVIRUS 2 (TAT 6-24 HRS) Nasopharyngeal Nasopharyngeal Swab     Status: None   Collection Time: 06/10/20  2:44 AM   Specimen: Nasopharyngeal Swab  Result Value Ref Range Status   SARS Coronavirus 2 NEGATIVE NEGATIVE Final    Comment: (NOTE) SARS-CoV-2 target nucleic acids are NOT DETECTED.  The SARS-CoV-2 RNA is generally detectable in upper and lower respiratory specimens during the acute phase of infection. Negative results do not preclude SARS-CoV-2 infection, do not rule out co-infections with other pathogens, and should not be used as the sole  basis for treatment or other patient management decisions. Negative results must be combined with clinical observations, patient history, and epidemiological information. The expected result is Negative.  Fact Sheet for Patients: SugarRoll.be  Fact Sheet for Healthcare Providers: https://www.woods-mathews.com/  This test is not yet approved or cleared by the [place] FDA and  has been authorized for detection and/or diagnosis of SARS-CoV-2 by FDA under an Emergency Use Authorization (EUA). This EUA will remain  in effect (meaning this test can be used) for the duration of the COVID-19 declaration under Se ction 564(b)(1) of the Act, 21 U.S.C. section 360bbb-3(b)(1), unless the authorization is terminated or revoked sooner.  Performed at [facility], [place] [address] [place], [place] [address]   Resp Panel by RT-PCR (Flu A&B, Covid) Nasopharyngeal Swab     Status: None   Collection Time: 06/10/20  2:44 AM   Specimen: Nasopharyngeal Swab; Nasopharyngeal(NP) swabs in vial transport medium  Result Value Ref Range Status   SARS Coronavirus 2 by RT PCR NEGATIVE NEGATIVE Final    Comment: (NOTE) SARS-CoV-2 target nucleic acids are NOT DETECTED.  The SARS-CoV-2 RNA is generally detectable in upper respiratory specimens during the acute phase of infection. The lowest concentration of SARS-CoV-2 viral copies this assay can detect is 138 copies/mL. A  negative result does not preclude SARS-Cov-2 infection and should not be used as the sole basis for treatment or other patient management decisions. A negative result may occur with  improper specimen collection/handling, submission of specimen other than nasopharyngeal swab, presence of viral mutation(s) within the areas targeted by this assay, and inadequate number of viral copies(<138 copies/mL). A negative result must be combined with clinical observations, patient history, and  epidemiological information. The expected result is Negative.  Fact Sheet for Patients:  EntrepreneurPulse.com.au  Fact Sheet for Healthcare Providers:  IncredibleEmployment.be  This test is no t yet approved or cleared by the [place] FDA and  has been authorized for detection and/or diagnosis of SARS-CoV-2 by FDA under an Emergency Use Authorization (EUA). This EUA will remain  in effect (meaning this test can be used) for the duration of the COVID-19 declaration under Section 564(b)(1) of the Act, 21 U.S.C.section 360bbb-3(b)(1), unless the authorization is terminated  or revoked sooner.       Influenza A by PCR NEGATIVE NEGATIVE Final   Influenza B by PCR NEGATIVE NEGATIVE Final    Comment: (NOTE) The Xpert Xpress SARS-CoV-2/FLU/RSV plus assay is intended as an aid in the diagnosis of influenza from Nasopharyngeal swab specimens and should not be used as a sole basis for treatment. Nasal washings and aspirates are unacceptable for Xpert Xpress SARS-CoV-2/FLU/RSV testing.  Fact Sheet for Patients: EntrepreneurPulse.com.au  Fact Sheet for Healthcare Providers: IncredibleEmployment.be  This test is not yet approved or cleared by the [place] FDA and has been authorized for detection and/or diagnosis of SARS-CoV-2 by FDA under an Emergency Use Authorization (EUA). This EUA will remain in effect (meaning this test can be used) for the duration of the COVID-19 declaration under Section 564(b)(1) of the Act, 21 U.S.C. section 360bbb-3(b)(1), unless the authorization is terminated or revoked.  Performed at [facility], [place] [address], [place], [place] [address]   Culture, blood (single)     Status: None   Collection Time: 06/10/20  4:04 AM   Specimen: BLOOD  Result Value Ref Range Status   Specimen Description BLOOD LEFT ANTECUBITAL  Final   Special Requests   Final    BOTTLES DRAWN  AEROBIC AND ANAEROBIC Blood Culture adequate volume   Culture   Final    NO GROWTH 5 DAYS Performed at [facility], [place] [address] [place], [place] [address]    Report Status 06/15/2020 FINAL  Final  Aerobic/Anaerobic Culture (surgical/deep wound)     Status: None   Collection Time: 06/10/20  1:18 PM   Specimen: PATH Other; Tissue  Result Value Ref Range Status   Specimen Description TISSUE  Final   Special Requests LEFT GLUTEAL SPEC 1  Final   Gram Stain   Final    FEW WBC PRESENT, PREDOMINANTLY PMN ABUNDANT GRAM POSITIVE COCCI MODERATE GRAM NEGATIVE RODS    Culture   Final    RARE GROUP B STREP(S.AGALACTIAE)ISOLATED TESTING AGAINST S. AGALACTIAE NOT ROUTINELY PERFORMED DUE TO PREDICTABILITY OF AMP/PEN/VAN SUSCEPTIBILITY. FEW ACTINOMYCES SPECIES ABUNDANT PEPTOSTREPTOCOCCUS SPECIES Standardized susceptibility testing for this organism is not available. Performed at [facility], [place] [address] [place], [place] [address]    Report Status 06/16/2020 FINAL  Final  Aerobic/Anaerobic Culture (surgical/deep wound)     Status: None   Collection Time: 06/10/20  1:25 PM   Specimen: PATH Other; Tissue  Result Value Ref Range Status   Specimen Description TISSUE  Final   Special Requests RIGHT  GLUTEAL SPEC 2  Final   Gram Stain   Final    NO WBC SEEN ABUNDANT GRAM POSITIVE COCCI FEW GRAM NEGATIVE RODS Performed at [facility], [place] [address] [place], [place] [address]    Culture   Final    RARE ESCHERICHIA COLI RARE GROUP B STREP(S.AGALACTIAE)ISOLATED TESTING AGAINST S. AGALACTIAE NOT ROUTINELY PERFORMED DUE TO PREDICTABILITY OF AMP/PEN/VAN SUSCEPTIBILITY. RARE STREPTOCOCCUS INFANTARIUS RARE STREPTOCOCCUS CONSTELLATUS ABUNDANT PEPTOSTREPTOCOCCUS SPECIES    Report Status 06/16/2020 FINAL  Final   Organism ID, Bacteria ESCHERICHIA COLI  Final   Organism ID, Bacteria STREPTOCOCCUS INFANTARIUS  Final      Susceptibility   Escherichia coli - MIC*     AMPICILLIN <=2 SENSITIVE Sensitive     CEFAZOLIN <=4 SENSITIVE Sensitive     CEFEPIME <=0.12 SENSITIVE Sensitive     CEFTAZIDIME <=1 SENSITIVE Sensitive     CEFTRIAXONE <=0.25 SENSITIVE Sensitive     CIPROFLOXACIN <=0.25 SENSITIVE Sensitive     GENTAMICIN <=1 SENSITIVE Sensitive     IMIPENEM <=0.25 SENSITIVE Sensitive     TRIMETH/SULFA <=20 SENSITIVE Sensitive     AMPICILLIN/SULBACTAM <=2 SENSITIVE Sensitive     PIP/TAZO <=4 SENSITIVE Sensitive     * RARE ESCHERICHIA COLI   Streptococcus infantarius - MIC*    PENICILLIN <=0.06 SENSITIVE Sensitive     CEFTRIAXONE <=0.12 SENSITIVE Sensitive     LEVOFLOXACIN 4 INTERMEDIATE Intermediate     VANCOMYCIN 0.5 SENSITIVE Sensitive     * RARE STREPTOCOCCUS INFANTARIUS  MRSA PCR Screening     Status: None   Collection Time: 06/13/20  2:38 PM   Specimen: Nasal Mucosa; Nasopharyngeal  Result Value Ref Range Status   MRSA by PCR NEGATIVE NEGATIVE Final    Comment:        The GeneXpert MRSA Assay (FDA approved for NASAL specimens only), is one component of a comprehensive MRSA colonization surveillance program. It is not intended to diagnose MRSA infection nor to guide or monitor treatment for MRSA infections. Performed at [facility], [place] [address] [place], [place] [address]   Culture, bal-quantitative     Status: Abnormal   Collection Time: 06/19/20  5:19 PM   Specimen: Bronchoalveolar Lavage; Respiratory  Result Value Ref Range Status   Specimen Description BRONCHIAL ALVEOLAR LAVAGE  Final   Special Requests Normal  Final   Gram Stain   Final    FEW WBC PRESENT, PREDOMINANTLY PMN NO ORGANISMS SEEN    Culture (A)  Final    1,000 COLONIES/mL Consistent with normal respiratory flora. Performed at [facility], [place] [address] [place], [place] [address]    Report Status 06/21/2020 FINAL  Final    Studies/Results: CT CHEST ABDOMEN PELVIS W CONTRAST  Result Date: 06/22/2020 CLINICAL DATA:  Fever. Evaluate for  intra-abdominal abscess. Morbid obesity. EXAM: CT CHEST, ABDOMEN, AND PELVIS WITH CONTRAST TECHNIQUE: Multidetector CT imaging of the chest, abdomen and pelvis was performed following the standard protocol during bolus administration of intravenous contrast. CONTRAST:  139m OMNIPAQUE IOHEXOL 300 MG/ML  SOLN COMPARISON:  Chest radiographs, most recent 1 day prior. pelvic CT 06/10/2020. FINDINGS: Severely limited exam secondary to patient body habitus. CT CHEST FINDINGS Cardiovascular: Right internal jugular line tip at high SVC. Grossly normal aortic caliber. Moderate cardiomegaly. Pulmonary artery enlargement, outflow tract 3.2 cm. Mediastinum/Nodes: Extremely limited evaluation for thoracic adenopathy. Lungs/Pleura: Small bilateral pleural effusions. Endotracheal tube well above the carina. Right lower and right middle lobe consolidation. Mild dependent atelectasis in the  left lower lobe. Left greater than right upper lobe ground-glass and less so airspace disease. No pneumothorax. Musculoskeletal: Mild right-sided gynecomastia. CT ABDOMEN PELVIS FINDINGS Hepatobiliary: Grossly normal liver. Gallbladder not well evaluated. Pancreas: No gross pancreatic abnormality. Spleen: Spleen grossly within normal limits. Adrenals/Urinary Tract: Normal adrenal glands. No gross hydronephrosis. Bladder decompressed around a Foley catheter. Stomach/Bowel: Nasogastric tube terminating at the gastric body. Normal caliber of bowel loops. No pneumatosis or free intraperitoneal air. Vascular/Lymphatic: Normal aortic caliber. Limited evaluation for abdominopelvic adenopathy. Reproductive: Grossly normal prostate. Other: Interval debridement of previously described perineal gas. Packing material identified about the gluteal crease. No well-defined residual or recurrent fluid collection. Musculoskeletal: No gross osseous abnormality. IMPRESSION: 1. Severely degraded exam secondary to patient body habitus. 2. Small bilateral pleural  effusions with relatively diffuse pulmonary opacities as detailed above. Favor infection and/or aspiration. 3. Interval debridement of previously described perineal infection. No gross residual fluid collection and no evidence of residual soft tissue gas. 4. Pulmonary artery enlargement suggests pulmonary arterial hypertension. Electronically Signed   By: Abigail Miyamoto M.D.   On: 06/22/2020 15:36      Assessment/Plan:  INTERVAL HISTORY:   Fevers staying down, WBC down    Principal Problem:   Fournier gangrene Active Problems:   Severe sepsis with septic shock (HCC)   DKA (diabetic ketoacidosis) (HCC)   AKI (acute kidney injury) ([place])   Diabetic ulcer of heel (HCC)   Atrial fibrillation, chronic ([place])   Essential hypertension   Dyslipidemia   Acquired hypothyroidism   Chronic pain disorder   Morbid obesity with BMI of 60.0-69.9, adult ([place])   Acute respiratory failure ([place])   Sepsis due to Streptococcus, group B ([place])   Actinomycosis    SAAHIR PRUDE is a 50 y.o. male with polymicrobial Fournier's gangrene with septic shock status post debridement by surgery twice who had a high fever to 105 degrees yesterday.  He was imaged and has now been reevaluated by general surgery.  His fevers have defervesced after we placed the cooling blanket underneath him again.  Apparently the cooling blanket had been dislodged on the night prior to his high temperature of 105 degrees.  Continue meropenem  Continue vigilant monitoring of the wound  PCN allergy: Nicoletta Dress, PhamD discussed allergy with family member and allergy was in childhood. We will challenge him today with amoxicillin test dose   LOS: 14 days   Alcide Evener 06/24/2020, 1:56 PM

## 2020-06-24 NOTE — Progress Notes (Signed)
Progress Note  13 Days Post-Op  Subjective: Seen for dressing change. Large BM over the weekend. Off pressors, remains on the vent. Updated mother at bedside.   Objective: Vital signs in last 24 hours: Temp:  [97.9 F (36.6 C)-98.6 F (37 C)] 98.6 F (37 C) (01/24 0400) Pulse Rate:  [100-113] 110 (01/24 0400) Resp:  [7-42] 19 (01/24 1000) BP: (116)/(48) 116/48 (01/23 1118) SpO2:  [85 %-98 %] 94 % (01/24 1000) Arterial Line BP: (97-161)/(39-64) 133/55 (01/24 1000) FiO2 (%):  [100 %] 100 % (01/24 0800) Weight:  [274 kg] 274 kg (01/24 0319) Last BM Date: 06/23/20  Intake/Output from previous day: 01/23 0701 - 01/24 0700 In: 4288.5 [I.V.:1324.5; KG/UR:4270.6; IV Piggyback:599.8] Out: 2376 [Urine:6892] Intake/Output this shift: Total I/O In: 380.6 [I.V.:195.6; NG/GT:185] Out: 605 [Urine:605]  PE: Gen: Intubated and sedated Heart: reg rate  Lungs: On vent Abdomen: soft, obese nontender Perirectal/perineal wound:Wound bedwith some fibrinous exudate and some beefy granulation tissue, some necrotic fatty tissue that is liquifying,as noted in the picture below. Wound drainage is mostly serosanguinous. There is no surrounding blanching erythema.      Lab Results:  Recent Labs    06/23/20 0432 06/23/20 1204 06/24/20 0439  WBC 12.3*  --  10.4  HGB 7.8* 8.5* 8.4*  HCT 27.6* 25.0* 30.6*  PLT 209  --  219   BMET Recent Labs    06/23/20 0432 06/23/20 1204 06/24/20 0439  NA 156* 160* 159*  K 3.2* 3.5 3.4*  CL 116*  --  116*  CO2 31  --  33*  GLUCOSE 145*  --  171*  BUN 37*  --  35*  CREATININE 1.20  --  1.06  CALCIUM 7.7*  --  8.1*   PT/INR No results for input(s): LABPROT, INR in the last 72 hours. CMP     Component Value Date/Time   NA 159 (H) 06/24/2020 0439   K 3.4 (L) 06/24/2020 0439   CL 116 (H) 06/24/2020 0439   CO2 33 (H) 06/24/2020 0439   GLUCOSE 171 (H) 06/24/2020 0439   BUN 35 (H) 06/24/2020 0439   CREATININE 1.06 06/24/2020 0439    CALCIUM 8.1 (L) 06/24/2020 0439   PROT 5.8 (L) 06/15/2020 0555   ALBUMIN 1.9 (L) 06/15/2020 0555   AST 96 (H) 06/15/2020 0555   ALT 532 (H) 06/15/2020 0555   ALKPHOS 77 06/15/2020 0555   BILITOT 1.7 (H) 06/15/2020 0555   GFRNONAA >60 06/24/2020 0439   Lipase  No results found for: LIPASE     Studies/Results: CT CHEST ABDOMEN PELVIS W CONTRAST  Result Date: 06/22/2020 CLINICAL DATA:  Fever. Evaluate for intra-abdominal abscess. Morbid obesity. EXAM: CT CHEST, ABDOMEN, AND PELVIS WITH CONTRAST TECHNIQUE: Multidetector CT imaging of the chest, abdomen and pelvis was performed following the standard protocol during bolus administration of intravenous contrast. CONTRAST:  OMNIPAQUE IOHEXOL 300 MG/ML  SOLN COMPARISON:  Chest radiographs, most recent 1 day prior. pelvic CT 06/10/2020. FINDINGS: Severely limited exam secondary to patient body habitus. CT CHEST FINDINGS Cardiovascular: Right internal jugular line tip at high SVC. Grossly normal aortic caliber. Moderate cardiomegaly. Pulmonary artery enlargement, outflow tract 3.2 cm. Mediastinum/Nodes: Extremely limited evaluation for thoracic adenopathy. Lungs/Pleura: Small bilateral pleural effusions. Endotracheal tube well above the carina. Right lower and right middle lobe consolidation. Mild dependent atelectasis in the left lower lobe. Left greater than right upper lobe ground-glass and less so airspace disease. No pneumothorax. Musculoskeletal: Mild right-sided gynecomastia. CT ABDOMEN PELVIS FINDINGS Hepatobiliary: Grossly normal  liver. Gallbladder not well evaluated. Pancreas: No gross pancreatic abnormality. Spleen: Spleen grossly within normal limits. Adrenals/Urinary Tract: Normal adrenal glands. No gross hydronephrosis. Bladder decompressed around a Foley catheter. Stomach/Bowel: Nasogastric tube terminating at the gastric body. Normal caliber of bowel loops. No pneumatosis or free intraperitoneal air. Vascular/Lymphatic: Normal aortic  caliber. Limited evaluation for abdominopelvic adenopathy. Reproductive: Grossly normal prostate. Other: Interval debridement of previously described perineal gas. Packing material identified about the gluteal crease. No well-defined residual or recurrent fluid collection. Musculoskeletal: No gross osseous abnormality. IMPRESSION: 1. Severely degraded exam secondary to patient body habitus. 2. Small bilateral pleural effusions with relatively diffuse pulmonary opacities as detailed above. Favor infection and/or aspiration. 3. Interval debridement of previously described perineal infection. No gross residual fluid collection and no evidence of residual soft tissue gas. 4. Pulmonary artery enlargement suggests pulmonary arterial hypertension. Electronically Signed   By: Jeronimo Greaves M.D.   On: 06/22/2020 15:36    Anti-infectives: Anti-infectives (From admission, onward)   Start     Dose/Rate Route Frequency Ordered Stop   06/24/20 1115  amoxicillin (AMOXIL) capsule 500 mg        500 mg Oral  Once 06/24/20 1025     06/22/20 1415  meropenem (MERREM) 2 g in sodium chloride 0.9 % 100 mL IVPB        2 g 200 mL/hr over 30 Minutes Intravenous Every 8 hours 06/22/20 1324     06/22/20 1400  meropenem (MERREM) 1 g in sodium chloride 0.9 % 100 mL IVPB  Status:  Discontinued        1 g 200 mL/hr over 30 Minutes Intravenous Every 8 hours 06/22/20 1248 06/22/20 1324   06/22/20 1300  doxycycline (VIBRAMYCIN) 100 mg in sodium chloride 0.9 % 250 mL IVPB  Status:  Discontinued        100 mg 125 mL/hr over 120 Minutes Intravenous Every 12 hours 06/22/20 1212 06/22/20 1248   06/20/20 1100  vancomycin (VANCOCIN) 2,500 mg in sodium chloride 0.9 % 500 mL IVPB  Status:  Discontinued        2,500 mg 250 mL/hr over 120 Minutes Intravenous Every 12 hours 06/20/20 1007 06/22/20 0813   06/20/20 1100  ceFEPIme (MAXIPIME) 2 g in sodium chloride 0.9 % 100 mL IVPB  Status:  Discontinued        2 g 200 mL/hr over 30 Minutes  Intravenous Every 8 hours 06/20/20 1007 06/22/20 1207   06/17/20 1130  metroNIDAZOLE (FLAGYL) IVPB 500 mg  Status:  Discontinued        500 mg 100 mL/hr over 60 Minutes Intravenous Every 8 hours 06/17/20 1041 06/22/20 1248   06/13/20 1400  cefTRIAXone (ROCEPHIN) 2 g in sodium chloride 0.9 % 100 mL IVPB  Status:  Discontinued        2 g 200 mL/hr over 30 Minutes Intravenous Every 24 hours 06/13/20 1242 06/20/20 0944   06/11/20 1300  vancomycin (VANCOREADY) IVPB 2000 mg/400 mL        2,000 mg 200 mL/hr over 120 Minutes Intravenous  Once 06/11/20 1212 06/11/20 1824   06/10/20 2000  clindamycin (CLEOCIN) IVPB 900 mg        900 mg 100 mL/hr over 30 Minutes Intravenous Every 8 hours 06/10/20 1307 06/13/20 1500   06/10/20 1600  ceFEPIme (MAXIPIME) 2 g in sodium chloride 0.9 % 100 mL IVPB  Status:  Discontinued        2 g 200 mL/hr over 30 Minutes Intravenous Every 12 hours  06/10/20 1504 06/13/20 1242   06/10/20 1400  metroNIDAZOLE (FLAGYL) IVPB 500 mg  Status:  Discontinued        500 mg 100 mL/hr over 60 Minutes Intravenous Every 8 hours 06/10/20 1023 06/10/20 1308   06/10/20 1300  aztreonam (AZACTAM) 2 g in sodium chloride 0.9 % 100 mL IVPB  Status:  Discontinued        2 g 200 mL/hr over 30 Minutes Intravenous Every 8 hours 06/10/20 1033 06/10/20 1504   06/10/20 1215  clindamycin (CLEOCIN) IVPB 900 mg        900 mg 100 mL/hr over 30 Minutes Intravenous To Surgery 06/10/20 1210 06/10/20 1210   06/10/20 1033  vancomycin variable dose per unstable renal function (pharmacist dosing)  Status:  Discontinued         Does not apply See admin instructions 06/10/20 1033 06/12/20 1252   06/10/20 0600  metroNIDAZOLE (FLAGYL) tablet 500 mg  Status:  Discontinued        500 mg Oral Every 8 hours 06/10/20 0406 06/10/20 0948   06/10/20 0415  aztreonam (AZACTAM) 2 g in sodium chloride 0.9 % 100 mL IVPB        2 g 200 mL/hr over 30 Minutes Intravenous  Once 06/10/20 0406 06/10/20 0546   06/10/20 0415   vancomycin (VANCOCIN) IVPB 1000 mg/200 mL premix  Status:  Discontinued        1,000 mg 200 mL/hr over 60 Minutes Intravenous  Once 06/10/20 0406 06/10/20 0412   06/10/20 0415  vancomycin (VANCOCIN) 2,500 mg in sodium chloride 0.9 % 500 mL IVPB        2,500 mg 250 mL/hr over 120 Minutes Intravenous  Once 06/10/20 0412 06/10/20 0903       Assessment/Plan A. Fib VT Arrest (1/10) VDRF DM2 Hypothyroidism Hx HTN  50 yo male with septic shock secondary to perianal/perineal necrotizing soft tissue infection-s/p operative debridement x2(1/10 and 1/11) - Wound examined at bedside today. No sharp debridement was required. Underlying tissuewith some fat necrosis but no operative debridement needed - Continue saline wet-to-dry dressings, currently being changed daily. Rectal tube removed as he required disimpaction of several hard stool balls 1/21. Dressing change more frequently for soiling.  - Antibiotics per ID - currently on merrem  - Appreciate CCM's assistance in management of patient shock and MMP - Surgery will continue to follow closely- we will see again Wednesday or sooner if needed - VTE: on heparin gtt for a-fib  LOS: 14 days    Juliet Rude , [facility] Surgery 06/24/2020, 10:44 AM Please see Amion for pager number during day hours 7:00am-4:30pm

## 2020-06-24 NOTE — Progress Notes (Addendum)
NAME:  Carl Hill, MRN:  [id number], DOB:  02-Mar-1971, LOS: 51 ADMISSION DATE:  06/10/2020, CONSULTATION DATE:  06/10/2020 REFERRING MD:  Lorin Mercy, CHIEF COMPLAINT:  Severe Sepsis    Brief History:  50 year old male who presented with buttock pain, fatigue and malaise.  Found to have hyperglycemia. CT pelvis demonstrated necrotizing fasciitis in L buttock, L upper thigh, and perineum. Taken to OR for debridement 1/10 c/b Afib with RVR and VT arrest s/p CPR/defib. Remains intubated on vent support, off pressors.  Past Medical History:  HTN, obesity, DM, AFib on DOAC  Significant Hospital Events:  1/10  Admitted to [facility]. General surgery consulted and debridement pursued. In the afternoon, patient developed hypotension with AF in RVR. Synchronized cardioversion unsuccessful with conversion to VT. CPR initated with ROSC after desynchronized defibrillation. 1/11 Debridement 1/18 Mucus plugging 1/19 Bronchoscopy >> mucus plug on Rt, airway collapse with exhalation 1/20 Persistent fever, change ABx 1/24 Increased vent requirements, habitus related +/- mucus plugging   Consults:  General Surgery  Procedures:  ETT 1/10 >>  R IJ CVL 1/10 >>  L brachial A-line 1/10 >>   Significant Diagnostic Tests:   CT abd/pelvis 1/10 >> Extensive subcutaneous emphysema in the medial aspect of the upper thighs extending through perineum and superiorly to the medial left buttock. Gas extends into the pelvis surrounding the anus inferiorly and tracking superiorly along the left wall of the rectum.  CT C/A/P 1/22 >> Imaging limited due to body habitus. No gross fluid collection or gas seen. Bilateral consolidation with dependent atelectasis  Micro Data:  COVID/Flu 1/10 >> negative MRSA PCR 1/10 >> negative L buttocks wound 1/10 >> E. coli, Streptococcus infantarious, Peptostreptococcus species, rare Group B Strep Blood 1/10 >> negative BAL 1/20 >> Normal flora  Antimicrobials:  Aztreonam  1/09 Metronidazole 1/09 Vancomycin 1/09, 1/11, 1/20 >>1/22 Cefepime 1/10 >> 1/13, 1/20 >>1/22 Clindamycin 1/10 >> 1/13 Ceftriaxone 1/13 >> 1/20 Metronidazole 1/17 >> 1/22 Meropenem 1/22 >>  Interim History / Subjective:  Afebrile, hemodynamics stable Desats to low 80s this AM Remains on full vent support Increased vent requirements, habitus +/- mucus plugging WBC down after transition to meropenem per ID recs Hypernatremic to high 150s Improved glycemic control  Objective   Blood pressure (!) 116/48, pulse (!) 110, temperature 98.6 F (37 C), temperature source Temporal, resp. rate (!) 23, height _0  (1.778 m), weight (!) 274 kg, SpO2 96 %.    Vent Mode: PCV FiO2 (%):  [100 %] 100 % Set Rate:  [18 bmp] 18 bmp PEEP:  [12 cmH20-14 cmH20] 12 cmH20 Plateau Pressure:  [26 cmH20-28 cmH20] 26 cmH20   Intake/Output Summary (Last 24 hours) at 06/24/2020 0856 Last data filed at 06/24/2020 0700 Gross per 24 hour  Intake 4135.03 ml  Output 6650 ml  Net -2514.97 ml   Filed Weights   06/22/20 0500 06/23/20 0500 06/24/20 0319  Weight: (!) 295.3 kg (!) 276.7 kg (!) 274 kg   Physical Exam: General: Morbidly obese adult male, sedated, in NAD. HEENT: Anicteric sclera, dry mucous membranes. ETT in place. Neuro: Sedated, does not withdraw extremities at present. CV: S1S2, RRR, no m/g/r. PULM: Breathing even and minimally labored on vent (PS 28/PEEP 18), scattered rhonchi bilaterally, diminished breath sounds bilaterally. GI: Obese, soft, nontender, nondistended. Normoactive bowel sounds. Extremities: BLE edema 2+ pitting. Skin: Warm/dry, mild erythema of intertriginous areas with Interdry in place. Venous stasis changes of BLE.   Resolved problems:  VT arrest 1/10, Ileus 1/15, Elevated LFTs  from shock, Metabolic acidosis with lactic acidosis Septic shock  Assessment & Plan:   Necrotizing fasciitis of Lt buttocks s/p debridement HCAP Persistent fever - Appreciate ID  recommendations - Antibiotics broadened to meropenem 1/22 - Plan for PCN allergy testing/trial this week - Surgery following, no indication for repeat debridement at present - Wound care per Surgery  Acute on chronic hypoxic/hypercapnic respiratory failure in setting of sepsis Volume overload Noted to have mucus plugging and dynamic airway collapse during bronchoscopy on 1/19. Spontaneous episode of desaturation 1/24AM with increased vent requirements. - Continue full vent support, minimum PEEP 12 - Habitus likely most contributory + mucus plugging - Position patient as able to maximize chest wall compliance - Consider trach once hemodynamics improved (previously d/w patient's family; they wish to pursue this) - Continue to assess volume status, now net +6L/admission after robust response to Lasix 1/22 - UOP adequate  Hypernatremia - Continues to be hypernatremic to high 150s - FWF 400cc Q4H - D5 initiated at 40m/hr  AFib with RVR HLD - Continue Amiodarone, Lipitor - Continue heparin gtt - Replete electrolytes as indicated  DM type 2 poorly controlled with hyperglycemia - Basal Levemir BID - Novolog standing Q4H for TF coverage, increased with D5 addition - Continue SSI  Hypothyroidism - Continue Synthroid  Acute metabolic encephalopathy 2nd to sepsis. 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 - Continue PAD protocol for RASS -1 to -2 - Currently on Fentanyl - Continue Lyrica, nortriptyline  Anemia of critical illness - Trend CBC - Transfuse for Hgb < 7  Best practice (evaluated daily)  Diet: Continue TF DVT prophylaxis: Heparin gtt  GI prophylaxis: Protonix  Mobility: Bedrest Disposition: ICU Family: updated pt's mother at bedside 1/23 Code status: full code  Labs    CMP Latest Ref Rng & Units 06/24/2020 06/23/2020 06/23/2020  Glucose 70 - 99 mg/dL 171(H) - 145(H)  BUN 6 - 20 mg/dL 35(H) - 37(H)  Creatinine 0.61 - 1.24 mg/dL 1.06 - 1.20  Sodium 135 - 145 mmol/L 159(H)  160(H) 156(H)  Potassium 3.5 - 5.1 mmol/L 3.4(L) 3.5 3.2(L)  Chloride 98 - 111 mmol/L 116(H) - 116(H)  CO2 22 - 32 mmol/L 33(H) - 31  Calcium 8.9 - 10.3 mg/dL 8.1(L) - 7.7(L)  Total Protein 6.5 - 8.1 g/dL - - -  Total Bilirubin 0.3 - 1.2 mg/dL - - -  Alkaline Phos 38 - 126 U/L - - -  AST 15 - 41 U/L - - -  ALT 0 - 44 U/L - - -    CBC Latest Ref Rng & Units 06/24/2020 06/23/2020 06/23/2020  WBC 4.0 - 10.5 K/uL 10.4 - 12.3(H)  Hemoglobin 13.0 - 17.0 g/dL 8.4(L) 8.5(L) 7.8(L)  Hematocrit 39.0 - 52.0 % 30.6(L) 25.0(L) 27.6(L)  Platelets 150 - 400 K/uL 219 - 209    ABG    Component Value Date/Time   PHART 7.401 06/23/2020 1204   PCO2ART 57.4 (H) 06/23/2020 1204   PO2ART 73 (L) 06/23/2020 1204   HCO3 35.5 (H) 06/23/2020 1204   TCO2 37 (H) 06/23/2020 1204   ACIDBASEDEF 2.0 06/13/2020 1221   O2SAT 94.0 06/23/2020 1204    CBG (last 3)  Recent Labs    06/24/20 0022 06/24/20 0426 06/24/20 0820  GLUCAP 145* 139* 194*   Critical care time: [id number] 06/24/20 9:55 AM  Please see [email or web address] for pager details.   PCCM:  50yo M, nec fascitis, left buttocks, septic shock, intubated  for acute on chronic hypoxemic and hypercarbic respiratory failure. Afib RVRV  Remains intubated on life support, critically ill. No fevers   BP (!) 116/48   Pulse (!) 110   Temp 98.6 F (37 C) (Axillary)   Resp 14   Ht _0  (1.778 m)   Wt (!) 274 kg   SpO2 99%   BMI 86.66 kg/m   Gen: Obese male, intubated on life support  HENT: ETT in place, large neck  Heart: RRR, s1 s2 Lungs: BL vented breaths  Abd: Obese Wounds: please see surgery images   Labs reviewed   A:  Sepsis, septic shock, off pressors now Necrotizing soft tissue skin infection  Atrial fibrillation with RVR  DMII, hyperglycemia  Acute metabolic encephalopathy   P: Full vent support  We will need to talk with family regarding prolonged support such as trach  I  think it is going to be difficult for him to recover with out this  He is currently too unstable for this.  Multiple vent changes this morning. Shifting positions and weight changes cuase him to de-saturate Continue diuresis  Follow UOP abx per ID  I discussed PCN challenge with them today, I am ok with this if we need it  Continue amio and heparin  Goal cbgs 140-180  This patient is critically ill with multiple organ system failure; which, requires frequent high complexity decision making, assessment, support, evaluation, and titration of therapies. This was completed through the application of advanced monitoring technologies and extensive interpretation of multiple databases. During this encounter critical care time was devoted to patient care services described in this note for 32 minutes.  Garner Nash, DO [facility] 06/24/2020 5:17 PM

## 2020-06-24 NOTE — Progress Notes (Signed)
Patient's mother, Jamesetta So, updated at bedside in detail on patient's status and plan of care.  Tim Lair, PA-C [facility] 06/24/20 4:05 PM

## 2020-06-24 NOTE — Progress Notes (Signed)
[facility] NEPHROLOGY PROGRESS NOTE  Assessment/ Plan: Pt is a 50 y.o. yo male  with morbid obesity, hypertension, DM, A. fib who was initially admitted on 1/10 for necrotizing fasciitis of left buttock and perineum underwent I&D, seen as a consultation for fluid overload and hypernatremia.  #Volume overload/anasarca:  Appears to be autodiuresing now with improvement in AKI.  Held diuretics yesterday.  Cont to hold for now and give PRN as clinically indicated.   #Acute kidney injury, nonoliguric in the setting of septic shock: AKI improved - peak was 4.9 on 06/13/20.  Cr 1.06 this AM  - much improved. Avoid IV contrast and nephrotoxins if possible.  Maintain BP.  #Hypernatremia: Problem with urinary concentration due to recovering AKI/ATN + osmotic diuresis post ATN.  Double FWF this AM from 400 Q8 to Q4 and check 2 PM electrolytes.  This should work itself out as his polyuria resolves.    #Septic shock due to necrotizing fasciitis, HCAP: On meropenem now.  ID following.  #Acute on chronic hypoxic/hypercapnic respiratory failure: On mechanical ventilation with very high PEEP.  Getting CT chest.  Diuresis as discussed above.  #Acute metabolic encephalopathy due to sepsis, sedation, per primary.  #Hypokalemia: Replete potassium chloride.  Monitor electrolytes. 2pm BMP ordered.  Subjective:  UOP 6.6L yesterday down from 12L+ day prior.  Serum sodium 159 this AM - stable.   Objective Vital signs in last 24 hours: Vitals:   06/24/20 0500 06/24/20 0600 06/24/20 0700 06/24/20 0735  BP:      Pulse:      Resp: (!) 23 15 (!) 23   Temp:      TempSrc:      SpO2: 96% 94% 95% 96%  Weight:      Height:       Weight change: -2.722 kg  Intake/Output Summary (Last 24 hours) at 06/24/2020 0742 Last data filed at 06/24/2020 0700 Gross per 24 hour  Intake 4288.49 ml  Output 6650 ml  Net -2361.51 ml       Labs: Basic Metabolic Panel: Recent Labs  Lab 06/22/20 0421  06/23/20 0432 06/23/20 1204 06/24/20 0439  NA 153* 156* 160* 159*  K 4.1 3.2* 3.5 3.4*  CL 118* 116*  --  116*  CO2 26 31  --  33*  GLUCOSE 176* 145*  --  171*  BUN 39* 37*  --  35*  CREATININE 1.08 1.20  --  1.06  CALCIUM 7.8* 7.7*  --  8.1*   Liver Function Tests: No results for input(s): AST, ALT, ALKPHOS, BILITOT, PROT, ALBUMIN in the last 168 hours. No results for input(s): LIPASE, AMYLASE in the last 168 hours. No results for input(s): AMMONIA in the last 168 hours. CBC: Recent Labs  Lab 06/20/20 0519 06/21/20 0425 06/22/20 0421 06/23/20 0432 06/23/20 1204 06/24/20 0439  WBC 18.3* 20.6* 16.8* 12.3*  --  10.4  HGB 8.0* 8.1* 8.1* 7.8* 8.5* 8.4*  HCT 26.8* 27.7* 27.5* 27.6* 25.0* 30.6*  MCV 96.1 99.6 97.5 98.6  --  100.0  PLT 300 269 268 209  --  219   Cardiac Enzymes: No results for input(s): CKTOTAL, CKMB, CKMBINDEX, TROPONINI in the last 168 hours. CBG: Recent Labs  Lab 06/23/20 1213 06/23/20 1516 06/23/20 1938 06/24/20 0022 06/24/20 0426  GLUCAP 137* 165* 144* 145* 139*    Iron Studies: No results for input(s): IRON, TIBC, TRANSFERRIN, FERRITIN in the last 72 hours. Studies/Results: CT CHEST ABDOMEN PELVIS W CONTRAST  Result Date: 06/22/2020 CLINICAL DATA:  Fever. Evaluate for intra-abdominal abscess. Morbid obesity. EXAM: CT CHEST, ABDOMEN, AND PELVIS WITH CONTRAST TECHNIQUE: Multidetector CT imaging of the chest, abdomen and pelvis was performed following the standard protocol during bolus administration of intravenous contrast. CONTRAST:  166m OMNIPAQUE IOHEXOL 300 MG/ML  SOLN COMPARISON:  Chest radiographs, most recent 1 day prior. pelvic CT 06/10/2020. FINDINGS: Severely limited exam secondary to patient body habitus. CT CHEST FINDINGS Cardiovascular: Right internal jugular line tip at high SVC. Grossly normal aortic caliber. Moderate cardiomegaly. Pulmonary artery enlargement, outflow tract 3.2 cm. Mediastinum/Nodes: Extremely limited evaluation for  thoracic adenopathy. Lungs/Pleura: Small bilateral pleural effusions. Endotracheal tube well above the carina. Right lower and right middle lobe consolidation. Mild dependent atelectasis in the left lower lobe. Left greater than right upper lobe ground-glass and less so airspace disease. No pneumothorax. Musculoskeletal: Mild right-sided gynecomastia. CT ABDOMEN PELVIS FINDINGS Hepatobiliary: Grossly normal liver. Gallbladder not well evaluated. Pancreas: No gross pancreatic abnormality. Spleen: Spleen grossly within normal limits. Adrenals/Urinary Tract: Normal adrenal glands. No gross hydronephrosis. Bladder decompressed around a Foley catheter. Stomach/Bowel: Nasogastric tube terminating at the gastric body. Normal caliber of bowel loops. No pneumatosis or free intraperitoneal air. Vascular/Lymphatic: Normal aortic caliber. Limited evaluation for abdominopelvic adenopathy. Reproductive: Grossly normal prostate. Other: Interval debridement of previously described perineal gas. Packing material identified about the gluteal crease. No well-defined residual or recurrent fluid collection. Musculoskeletal: No gross osseous abnormality. IMPRESSION: 1. Severely degraded exam secondary to patient body habitus. 2. Small bilateral pleural effusions with relatively diffuse pulmonary opacities as detailed above. Favor infection and/or aspiration. 3. Interval debridement of previously described perineal infection. No gross residual fluid collection and no evidence of residual soft tissue gas. 4. Pulmonary artery enlargement suggests pulmonary arterial hypertension. Electronically Signed   By: KAbigail MiyamotoM.D.   On: 06/22/2020 15:36    Medications: Infusions: . sodium chloride    . feeding supplement (PIVOT 1.5 CAL) 1,000 mL (06/24/20 0517)  . fentaNYL infusion INTRAVENOUS 200 mcg/hr (06/24/20 0700)  . heparin 1,900 Units/hr (06/24/20 0700)   And  . heparin 1,900 Units/hr (06/24/20 0700)  . meropenem (MERREM) IV  Stopped (06/24/20 0544)  . norepinephrine (LEVOPHED) Adult infusion Stopped (06/22/20 0945)  . phenylephrine (NEO-SYNEPHRINE) Adult infusion Stopped (06/22/20 0940)  . propofol (DIPRIVAN) infusion 35 mcg/kg/min (06/20/20 0859)    Scheduled Medications: . amiodarone  200 mg Per Tube Daily  . atorvastatin  20 mg Per Tube QPM  . chlorhexidine gluconate (MEDLINE KIT)  15 mL Mouth Rinse BID  . Chlorhexidine Gluconate Cloth  6 each Topical Daily  . docusate  100 mg Per Tube BID  . feeding supplement (PROSource TF)  90 mL Per Tube QID  . free water  400 mL Per Tube Q8H  . insulin aspart  0-20 Units Subcutaneous Q4H  . insulin aspart  15 Units Subcutaneous Q4H  . insulin detemir  40 Units Subcutaneous Q12H  . levothyroxine  75 mcg Per Tube Daily  . mouth rinse  15 mL Mouth Rinse 10 times per day  . nortriptyline  25 mg Per Tube BID  . pantoprazole sodium  40 mg Per Tube Q24H  . polyethylene glycol  17 g Per Tube BID  . potassium chloride  40 mEq Per Tube Once  . pregabalin  300 mg Per Tube BID  . sennosides  10 mL Per Tube BID  . sodium chloride flush  10-40 mL Intracatheter Q12H  . sodium chloride flush  3 mL Intravenous Q12H    have reviewed scheduled  and prn medications.  Physical Exam: General: Morbidly obese male intubated and sedated Heart:RRR, s1s2 nl Lungs: Coarse breath sound bilateral Abdomen:soft,distended Extremities anasarca Dialysis Access: None  Ria Comment A Kaitlyn Franko 06/24/2020,7:42 AM  LOS: 14 days

## 2020-06-24 NOTE — Progress Notes (Signed)
ANTICOAGULATION CONSULT NOTE - Follow Up Consult  Pharmacy Consult for Heparin Indication: atrial fibrillation  Patient Measurements: Height: 5\' 10"  (177.8 cm) Weight: (!) 274 kg (604 lb) IBW/kg (Calculated) : 73 Heparin Dosing Weight: 149.7 kg  Vital Signs: Temp: 98.6 F (37 C) (01/24 1534) Temp Source: Axillary (01/24 1534)  Labs: Recent Labs    06/22/20 0421 06/22/20 1239 06/23/20 0432 06/23/20 0433 06/23/20 1204 06/23/20 2008 06/24/20 0439 06/24/20 1434 06/24/20 1817  HGB 8.1*  --  7.8*  --  8.5*  --  8.4*  --   --   HCT 27.5*  --  27.6*  --  25.0*  --  30.6*  --   --   PLT 268  --  209  --   --   --  219  --   --   HEPARINUNFRC 0.22*   < >  --    < >  --  0.70 0.52  --  0.51  CREATININE 1.08  --  1.20  --   --   --  1.06 1.11  --    < > = values in this interval not displayed.    Estimated Creatinine Clearance: 176.6 mL/min (by C-G formula based on SCr of 1.11 mg/dL).   Assessment: 49YOM admitted with septic shock secondary to necrotizing fascitis, on heparin for afib.  The patient is requiring two heparin bags to achieve a therapeutic rate - this has been double checked and verified by pharmacy. CBC stable, PLT wnl. Per RN no issues with bleeding or infusion. Heparin level is thearaputic.  No overt bleeding or complications noted.  Goal of Therapy:  Heparin level 0.3-0.7 units/ml Monitor platelets by anticoagulation protocol: Yes   Plan:  - Continue Heparin at 3800 units/hr (RN running 2 bags - each will be at a rate of 1900 units/hr).  - Will continue to monitor for any signs/symptoms of bleeding and will follow up with heparin level with morning labs. -  Twice daily checks with high Heparin rate  Thank you for allowing pharmacy to be a part of this patient's care.  06/26/20, PharmD, [facility] Clinical Pharmacist Please see AMION for all Pharmacists' Contact Phone Numbers 06/24/2020, 7:12 PM

## 2020-06-24 NOTE — Progress Notes (Signed)
Antibiotic Allergy Note:  Assessment:  Patient is a 50 year old male with a documented allergy to penicillin as hives/anaphylaxis. The patient is unable to answer questions at this time so I obtained a history from his mother who was well-aware of his allergy history. She explained he first had a reaction to penicillin when he was very young, about 70 months old. He was sick with a pneumonia and developed a rash and some additional difficulty breathing after receiving penicillin. He was never re-challenged with penicillin since this episode. He did undergo allergy testing when he was 10, and this showed he was allergic to penicillin. It is common for patients to be allergic to penicillin when they are children and to later outgrow that allergy as an adult. Given the description of the allergic reaction I do not believe the patient is at risk for a life threatening reaction. We will challenge him with a dose of amoxicillin to see if he is still allergic. If he has outgrown his reaction we will be able to optimize his antibiotic regimen and update his allergy list.    Plan:  -Amoxicillin 500 mg x1 dose -Vital signs every 15 minutes for the hour after administration -Remove allergy if no reaction   Jettie Pagan, PharmD, BCIDP Infectious Disease Pharmacist  Phone: [phone]   Post Challenge:  Patient tolerated without issue, will optimize antibiotics to Unasyn and take off allergy

## 2020-06-24 NOTE — Progress Notes (Signed)
ANTICOAGULATION CONSULT NOTE - Follow Up Consult  Pharmacy Consult for Heparin Indication: atrial fibrillation  Patient Measurements: Height: 5\' 10"  (177.8 cm) Weight: (!) 274 kg (604 lb) IBW/kg (Calculated) : 73 Heparin Dosing Weight: 149.7 kg  Vital Signs: Temp: 98.6 F (37 C) (01/24 0400) Temp Source: Temporal (01/24 0400) Pulse Rate: 110 (01/24 0400)  Labs: Recent Labs    06/22/20 0421 06/22/20 1239 06/23/20 0432 06/23/20 0433 06/23/20 1128 06/23/20 1204 06/23/20 2008 06/24/20 0439  HGB 8.1*  --  7.8*  --   --  8.5*  --  8.4*  HCT 27.5*  --  27.6*  --   --  25.0*  --  30.6*  PLT 268  --  209  --   --   --   --  219  HEPARINUNFRC 0.22*   < >  --    < > 0.89*  --  0.70 0.52  CREATININE 1.08  --  1.20  --   --   --   --  1.06   < > = values in this interval not displayed.    Estimated Creatinine Clearance: 184.9 mL/min (by C-G formula based on SCr of 1.06 mg/dL).   Assessment: 49YOM admitted with septic shock secondary to necrotizing fascitis, on heparin for afib.  The patient is requiring two heparin bags to achieve a therapeutic rate - this has been double checked and verified by pharmacy. CBC stable, PLT wnl. Per RN no issues with bleeding or infusion. Heparin level is thearaputic.  No overt bleeding or complications noted.  Goal of Therapy:  Heparin level 0.3-0.7 units/ml Monitor platelets by anticoagulation protocol: Yes   Plan:  - Continue Heparin at 3800 units/hr (RN running 2 bags - each will be at a rate of 1900 units/hr).  - Will continue to monitor for any signs/symptoms of bleeding and will follow up with heparin level with morning labs. -  Twice daily checks with high Heparin rate  Thank you for allowing pharmacy to be a part of this patient's care.  06/26/20, PharmD PGY1 Pharmacy Resident 06/24/2020 8:25 AM  [facility] pharmacy phone numbers are listed on [email or web address]

## 2020-06-25 ENCOUNTER — Inpatient Hospital Stay (HOSPITAL_COMMUNITY): Payer: Medicaid Other

## 2020-06-25 DIAGNOSIS — J9602 Acute respiratory failure with hypercapnia: Secondary | ICD-10-CM | POA: Diagnosis not present

## 2020-06-25 DIAGNOSIS — E039 Hypothyroidism, unspecified: Secondary | ICD-10-CM | POA: Diagnosis not present

## 2020-06-25 DIAGNOSIS — N179 Acute kidney failure, unspecified: Secondary | ICD-10-CM | POA: Diagnosis not present

## 2020-06-25 DIAGNOSIS — N493 Fournier gangrene: Secondary | ICD-10-CM | POA: Diagnosis not present

## 2020-06-25 DIAGNOSIS — J9601 Acute respiratory failure with hypoxia: Secondary | ICD-10-CM | POA: Diagnosis not present

## 2020-06-25 DIAGNOSIS — A401 Sepsis due to streptococcus, group B: Secondary | ICD-10-CM | POA: Diagnosis not present

## 2020-06-25 DIAGNOSIS — R6521 Severe sepsis with septic shock: Secondary | ICD-10-CM | POA: Diagnosis not present

## 2020-06-25 LAB — GLUCOSE, CAPILLARY
Glucose-Capillary: 132 mg/dL — ABNORMAL HIGH (ref 70–99)
Glucose-Capillary: 140 mg/dL — ABNORMAL HIGH (ref 70–99)
Glucose-Capillary: 149 mg/dL — ABNORMAL HIGH (ref 70–99)
Glucose-Capillary: 150 mg/dL — ABNORMAL HIGH (ref 70–99)
Glucose-Capillary: 156 mg/dL — ABNORMAL HIGH (ref 70–99)
Glucose-Capillary: 158 mg/dL — ABNORMAL HIGH (ref 70–99)

## 2020-06-25 LAB — CBC
HCT: 34.3 % — ABNORMAL LOW (ref 39.0–52.0)
Hemoglobin: 9 g/dL — ABNORMAL LOW (ref 13.0–17.0)
MCH: 27.4 pg (ref 26.0–34.0)
MCHC: 26.2 g/dL — ABNORMAL LOW (ref 30.0–36.0)
MCV: 104.3 fL — ABNORMAL HIGH (ref 80.0–100.0)
Platelets: 232 10*3/uL (ref 150–400)
RBC: 3.29 MIL/uL — ABNORMAL LOW (ref 4.22–5.81)
RDW: 18 % — ABNORMAL HIGH (ref 11.5–15.5)
WBC: 12.7 10*3/uL — ABNORMAL HIGH (ref 4.0–10.5)
nRBC: 1.4 % — ABNORMAL HIGH (ref 0.0–0.2)

## 2020-06-25 LAB — BASIC METABOLIC PANEL
Anion gap: 11 (ref 5–15)
BUN: 53 mg/dL — ABNORMAL HIGH (ref 6–20)
CO2: 32 mmol/L (ref 22–32)
Calcium: 8.3 mg/dL — ABNORMAL LOW (ref 8.9–10.3)
Chloride: 112 mmol/L — ABNORMAL HIGH (ref 98–111)
Creatinine, Ser: 1.71 mg/dL — ABNORMAL HIGH (ref 0.61–1.24)
GFR, Estimated: 49 mL/min — ABNORMAL LOW (ref >60)
Glucose, Bld: 134 mg/dL — ABNORMAL HIGH (ref 70–99)
Potassium: 4 mmol/L (ref 3.5–5.1)
Sodium: 155 mmol/L — ABNORMAL HIGH (ref 135–145)

## 2020-06-25 LAB — HEPARIN LEVEL (UNFRACTIONATED): Heparin Unfractionated: 0.41 IU/mL (ref 0.30–0.70)

## 2020-06-25 LAB — MAGNESIUM: Magnesium: 2.5 mg/dL — ABNORMAL HIGH (ref 1.7–2.4)

## 2020-06-25 LAB — TRIGLYCERIDES: Triglycerides: 112 mg/dL (ref <150)

## 2020-06-25 MED ORDER — FENTANYL CITRATE (PF) 100 MCG/2ML IJ SOLN: 100.0000 ug | Freq: Once | INTRAMUSCULAR | Status: AC

## 2020-06-25 MED ORDER — FENTANYL CITRATE (PF) 100 MCG/2ML IJ SOLN
INTRAMUSCULAR | Status: AC
  Administered 2020-06-25: 100 ug via INTRAVENOUS
  Filled 2020-06-25: qty 2

## 2020-06-25 NOTE — Progress Notes (Signed)
 of Fentanyl Drip wasted with Lorenza Burton RN.

## 2020-06-25 NOTE — Progress Notes (Signed)
Patient returned from CT without complication. Patient transported with 2 RT, 1 RN and two transporters.

## 2020-06-25 NOTE — Progress Notes (Signed)
Notified E-link patient still not responding to pain with extremities or eyes, only moves mouth to mouthcare despite decreasing fentanyl to 34mcg/hr.

## 2020-06-25 NOTE — Progress Notes (Addendum)
eLink Physician-Brief Progress Note Patient Name: Carl Hill DOB: [birth year] MRN: [id number]   Date of Service  06/25/2020  HPI/Events of Note  Mental status not improved with Fentanyl IV infusion off for 1 hour. Bllod glucose = 158.   eICU Interventions  Plan: 1. Head CT Scan w/o contrast STAT.      Intervention Category Major Interventions: Change in mental status - evaluation and management  Sommer,Steven Eugene 06/25/2020, 3:20 AM

## 2020-06-25 NOTE — Progress Notes (Signed)
eLink Physician-Brief Progress Note Patient Name: Carl Hill DOB: 1970/10/07 MRN: [id number]   Date of Service  06/25/2020  HPI/Events of Note  Change in mental status - RASS = -5. Fentanyl IV infusion running at 200 mcg/hour and has been decreased to 50 mcg/hour by the nurse. I suspect that this is related to IV sedation.   eICU Interventions  Plan: 1. If mental status not improved in 1 hour, turn Fentanyl IV infusion off.      Intervention Category Major Interventions: Change in mental status - evaluation and management  Lyndee Herbst Eugene 06/25/2020, 12:21 AM

## 2020-06-25 NOTE — Progress Notes (Addendum)
NAME:  Carl Hill, MRN:  [id number], DOB:  1971/04/19, LOS: 7 ADMISSION DATE:  06/10/2020, CONSULTATION DATE:  06/10/2020 REFERRING MD:  Lorin Mercy, CHIEF COMPLAINT:  Severe Sepsis    Brief History:  50 year old male who presented with buttock pain, fatigue and malaise.  Found to have hyperglycemia. CT pelvis demonstrated necrotizing fasciitis in L buttock, L upper thigh, and perineum. Taken to OR for debridement 1/10 c/b Afib with RVR and VT arrest s/p CPR/defib. Remains intubated on vent support. Pressors resumed 1/25.  Past Medical History:  HTN, obesity, DM, AFib on DOAC  Significant Hospital Events:  1/10  Admitted to [facility]. General surgery consulted and debridement pursued. In the afternoon, patient developed hypotension with AF in RVR. Synchronized cardioversion unsuccessful with conversion to VT. CPR initated with ROSC after desynchronized defibrillation. 1/11 Debridement 1/18 Mucus plugging 1/19 Bronchoscopy >> mucus plug on Rt, airway collapse with exhalation 1/20 Persistent fever, change ABx 1/24 Increased vent requirements, habitus related +/- mucus plugging 1/25 Hypotensive to SBP 80s, Levo resumed, minimally responsive overnight off sedation, CT Head negative  Consults:  General Surgery, ID  Procedures:  ETT 1/10 >>  R IJ CVL 1/10 >>  L brachial A-line 1/10 >>   Significant Diagnostic Tests:   CT abd/pelvis 1/10 >> Extensive subcutaneous emphysema in the medial aspect of the upper thighs extending through perineum and superiorly to the medial left buttock. Gas extends into the pelvis surrounding the anus inferiorly and tracking superiorly along the left wall of the rectum.  CT C/A/P 1/22 >> Imaging limited due to body habitus. No gross fluid collection or gas seen. Bilateral consolidation with dependent atelectasis  CT Head 1/25 >> No acute intracranial abnormality  Micro Data:  COVID/Flu 1/10 >> negative MRSA PCR 1/10 >> negative L buttocks wound 1/10 >> E. coli,  Streptococcus infantarious, Peptostreptococcus species, rare GBS Blood 1/10 >> negative BAL 1/20 >> Normal flora  Antimicrobials:  Aztreonam 1/09 Metronidazole 1/09 Vancomycin 1/09, 1/11, 1/20 >>1/22 Cefepime 1/10 >> 1/13, 1/20 >>1/22 Clindamycin 1/10 >> 1/13 Ceftriaxone 1/13 >> 1/20 Metronidazole 1/17 >> 1/22 Meropenem 1/22 >> 1/25 Amoxicillin 1/24 (x 1 for PCN Allergy challenge) Unasyn 1/25 >>  Interim History / Subjective:  Hypotensive overnight to SBP 80s Fentanyl discontinued ~midnight, remained minimally responsive Levo resumed CT Head NAICA Tmax 100.1/24H, WBC 12.7 (10.4) Remains on full vent support (28/18, FiO2 80%) Meropenem transitioned to Unasyn Hypernatremia improving with addition of D5  Objective   Blood pressure (!) 96/44, pulse (!) 109, temperature 98.3 F (36.8 C), temperature source Oral, resp. rate 16, height _0  (1.778 m), weight (!) 277.1 kg, SpO2 98 %.    Vent Mode: PCV FiO2 (%):  [80 %-100 %] 80 % Set Rate:  [18 bmp] 18 bmp PEEP:  [18 cmH20] 18 cmH20 Plateau Pressure:  [28 cmH20-39 cmH20] 39 cmH20   Intake/Output Summary (Last 24 hours) at 06/25/2020 1211 Last data filed at 06/25/2020 1159 Gross per 24 hour  Intake 7108.92 ml  Output 2577 ml  Net 4531.92 ml   Filed Weights   06/23/20 0500 06/24/20 0319 06/25/20 0500  Weight: (!) 276.7 kg (!) 274 kg (!) 277.1 kg   Physical Exam: General: Morbidly obese adult male, sedated, in NAD. HEENT: Anicteric sclera, dry mucous membranes, ETT present. Neuro: Sedate despite fentanyl discontinuation, minimally responsive. Does not withdraw to painful stimuli, opened eyes this AM during assessment with significant prompting CV: S1S2, RRR, no m/g/r. PULM: Breathing even and minimally labored on vent (PS28/PEEP 18,  FIO2 80%), scattered rhonchi throughout, diminished breath sounds bilaterally. GI: Obese, soft, nontender, nondistended. Normoactive BS. Extremities: BLE edema, symmetric 2+ pitting. Skin:  Warm, intertriginous erythema + Interdry, venous stasis changes BLE.  Resolved problems:  VT arrest 1/10, Ileus 1/15, Elevated LFTs from shock, Metabolic acidosis with lactic acidosis Septic shock  Assessment & Plan:   Necrotizing fasciitis of Lt buttocks s/p debridement HCAP Persistent fever - Appreciate ID recs - S/p PCN challenge 1/24, tolerated well - Meropenem discontinued, Unasyn initiated - Surgery following, no indication for repeat debridement at present - Wound care per CCS  Acute on chronic hypoxic/hypercapnic respiratory failure in setting of sepsis Volume overload Noted to have mucus plugging and dynamic airway collapse during bronchoscopy on 1/19. Spontaneous episode of desaturation 1/24AM with increased vent requirements. - Continue full vent support, minimum PEEP 12 - Habitus likely most contributory + mucus plugging - Position patient as able to maximize chest wall compliance - Consider trach once hemodynamics improved (previously d/w patient's family; they wish to pursue this) - Continue to assess volume status, now net +6L/admission after robust response to Lasix 1/22 - UOP adequate, net +2.9L/admission  Hypotension Likely multifactorial in the setting of sepsis/sedation - Continue Levophed (resumed 1/25AM)  - Wean as able for MAP goal > 65  Hypernatremia - Ongoing hypernatremia to 150s, improving - Continue FWF 465m Q4H - Continue D5  AFib with RVR HLD - Continue Amiodarone, Lipitor - Continue heparin gtt - Replete electrolytes as indicated  DM type 2 poorly controlled with hyperglycemia - Basal Levemir BID - Novolog standing Q4H for TF coverage - Continue SSI  Hypothyroidism - Continue Synthroid  Acute metabolic encephalopathy 2nd to sepsis. 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 - PAD protocol for RASS -1 to -2 - Fentanyl discontinued 1/25AM, remains sedate - Consider holding Lyrica, nortriptyline  Anemia of critical illness - Trend CBC -  Transfuse for Hgb <7  Best practice (evaluated daily)  Diet: Continue TF DVT prophylaxis: Heparin gtt  GI prophylaxis: Protonix  Mobility: Bedrest Disposition: ICU Family: Updated pt's mother at bedside 1/25 Code status: Full Code  Labs    CMP Latest Ref Rng & Units 06/25/2020 06/24/2020 06/24/2020  Glucose 70 - 99 mg/dL 134(H) 222(H) 171(H)  BUN 6 - 20 mg/dL 53(H) 39(H) 35(H)  Creatinine 0.61 - 1.24 mg/dL 1.71(H) 1.11 1.06  Sodium 135 - 145 mmol/L 155(H) 157(H) 159(H)  Potassium 3.5 - 5.1 mmol/L 4.0 4.1 3.4(L)  Chloride 98 - 111 mmol/L 112(H) 115(H) 116(H)  CO2 22 - 32 mmol/L 32 33(H) 33(H)  Calcium 8.9 - 10.3 mg/dL 8.3(L) 8.0(L) 8.1(L)  Total Protein 6.5 - 8.1 g/dL - - -  Total Bilirubin 0.3 - 1.2 mg/dL - - -  Alkaline Phos 38 - 126 U/L - - -  AST 15 - 41 U/L - - -  ALT 0 - 44 U/L - - -    CBC Latest Ref Rng & Units 06/25/2020 06/24/2020 06/23/2020  WBC 4.0 - 10.5 K/uL 12.7(H) 10.4 -  Hemoglobin 13.0 - 17.0 g/dL 9.0(L) 8.4(L) 8.5(L)  Hematocrit 39.0 - 52.0 % 34.3(L) 30.6(L) 25.0(L)  Platelets 150 - 400 K/uL 232 219 -   ABG    Component Value Date/Time   PHART 7.401 06/23/2020 1204   PCO2ART 57.4 (H) 06/23/2020 1204   PO2ART 73 (L) 06/23/2020 1204   HCO3 35.5 (H) 06/23/2020 1204   TCO2 37 (H) 06/23/2020 1204   ACIDBASEDEF 2.0 06/13/2020 1221   O2SAT 94.0 06/23/2020 1204    CBG (  last 3)  Recent Labs    06/25/20 0304 06/25/20 0812 06/25/20 1204  GLUCAP 158* 149* 150*   Critical care time: 35 minutes   Lestine Mount, PA-C [facility] 06/25/20 12:11 PM  Please see [email or web address] for pager details.   PCCM:   50 yo presented with buttock pain found to have necrotizing fasciitis status post debridement and surgery septic shock related to this A. fib RVR related to this.  BP (!) 96/44   Pulse (!) 113   Temp (!) 100.7 F (38.2 C) (Esophageal)   Resp (!) 24   Ht _0  (1.778 m)   Wt (!) 277.1 kg   SpO2 99%   BMI 87.67 kg/m    General: Obese male intubated mechanical life support HEENT: Endotracheal tube in place Abdomen: Obese pannus Heart: Regular rhythm S1-S2 lungs: Clear to auscultation, diminished, bilateral mechanically ventilated breath sounds overall difficult auscultate  Labs: Reviewed  Discussed with surgery wound care as well as placement of fecal management system.  Assessment: Necrotizing fasciitis status post debridement by surgery Acute on chronic hypoxemic hypercapnic respiratory failure Septic shock secondary to above, hypotension resolved no longer on pressors at this time. Ongoing extensive wound care secondary to above A. fib with RVR Type 2 diabetes  Plan: Vent settings are improving. Continue to wean PEEP and FiO2. Suspect will remain a high PEEP need due to obesity. I suspect he may need prolonged mechanical support.  For sedation needs and wound care likely that tracheostomy tube may be necessary.  We will discuss with family.  This patient is critically ill with multiple organ system failure; which, requires frequent high complexity decision making, assessment, support, evaluation, and titration of therapies. This was completed through the application of advanced monitoring technologies and extensive interpretation of multiple databases. During this encounter critical care time was devoted to patient care services described in this note for 32 minutes.  Garner Nash, DO [facility] 06/25/2020 5:51 PM

## 2020-06-25 NOTE — Progress Notes (Signed)
eLink Physician-Brief Progress Note Patient Name: Carl Hill DOB: [birth year] MRN: [id number]   Date of Service  06/25/2020  HPI/Events of Note  Request for LFTs as previously elevated  (2/15) On Tylenol  eICU Interventions  LFTs ordered     Intervention Category Minor Interventions: Clinical assessment - ordering diagnostic tests  Darl Pikes 06/25/2020, 9:20 PM

## 2020-06-25 NOTE — Progress Notes (Addendum)
Subjective: On ventilator    Antibiotics:  Anti-infectives (From admission, onward)   Start     Dose/Rate Route Frequency Ordered Stop   06/24/20 1615  Ampicillin-Sulbactam (UNASYN) 3 g in sodium chloride 0.9 % 100 mL IVPB        3 g 200 mL/hr over 30 Minutes Intravenous Every 6 hours 06/24/20 1517     06/24/20 1300  amoxicillin (AMOXIL) capsule 500 mg        500 mg Oral  Once 06/24/20 1241 06/24/20 1256   06/24/20 1115  amoxicillin (AMOXIL) capsule 500 mg  Status:  Discontinued        500 mg Oral  Once 06/24/20 1025 06/24/20 1241   06/22/20 1415  meropenem (MERREM) 2 g in sodium chloride 0.9 % 100 mL IVPB  Status:  Discontinued        2 g 200 mL/hr over 30 Minutes Intravenous Every 8 hours 06/22/20 1324 06/24/20 1517   06/22/20 1400  meropenem (MERREM) 1 g in sodium chloride 0.9 % 100 mL IVPB  Status:  Discontinued        1 g 200 mL/hr over 30 Minutes Intravenous Every 8 hours 06/22/20 1248 06/22/20 1324   06/22/20 1300  doxycycline (VIBRAMYCIN) 100 mg in sodium chloride 0.9 % 250 mL IVPB  Status:  Discontinued        100 mg 125 mL/hr over 120 Minutes Intravenous Every 12 hours 06/22/20 1212 06/22/20 1248   06/20/20 1100  vancomycin (VANCOCIN) 2,500 mg in sodium chloride 0.9 % 500 mL IVPB  Status:  Discontinued        2,500 mg 250 mL/hr over 120 Minutes Intravenous Every 12 hours 06/20/20 1007 06/22/20 0813   06/20/20 1100  ceFEPIme (MAXIPIME) 2 g in sodium chloride 0.9 % 100 mL IVPB  Status:  Discontinued        2 g 200 mL/hr over 30 Minutes Intravenous Every 8 hours 06/20/20 1007 06/22/20 1207   06/17/20 1130  metroNIDAZOLE (FLAGYL) IVPB 500 mg  Status:  Discontinued        500 mg 100 mL/hr over 60 Minutes Intravenous Every 8 hours 06/17/20 1041 06/22/20 1248   06/13/20 1400  cefTRIAXone (ROCEPHIN) 2 g in sodium chloride 0.9 % 100 mL IVPB  Status:  Discontinued        2 g 200 mL/hr over 30 Minutes Intravenous Every 24 hours 06/13/20 1242 06/20/20 0944   06/11/20  1300  vancomycin (VANCOREADY) IVPB 2000 mg/400 mL        2,000 mg 200 mL/hr over 120 Minutes Intravenous  Once 06/11/20 1212 06/11/20 1824   06/10/20 2000  clindamycin (CLEOCIN) IVPB 900 mg        900 mg 100 mL/hr over 30 Minutes Intravenous Every 8 hours 06/10/20 1307 06/13/20 1500   06/10/20 1600  ceFEPIme (MAXIPIME) 2 g in sodium chloride 0.9 % 100 mL IVPB  Status:  Discontinued        2 g 200 mL/hr over 30 Minutes Intravenous Every 12 hours 06/10/20 1504 06/13/20 1242   06/10/20 1400  metroNIDAZOLE (FLAGYL) IVPB 500 mg  Status:  Discontinued        500 mg 100 mL/hr over 60 Minutes Intravenous Every 8 hours 06/10/20 1023 06/10/20 1308   06/10/20 1300  aztreonam (AZACTAM) 2 g in sodium chloride 0.9 % 100 mL IVPB  Status:  Discontinued        2 g 200 mL/hr over 30 Minutes Intravenous Every  8 hours 06/10/20 1033 06/10/20 1504   06/10/20 1215  clindamycin (CLEOCIN) IVPB 900 mg        900 mg 100 mL/hr over 30 Minutes Intravenous To Surgery 06/10/20 1210 06/10/20 1210   06/10/20 1033  vancomycin variable dose per unstable renal function (pharmacist dosing)  Status:  Discontinued         Does not apply See admin instructions 06/10/20 1033 06/12/20 1252   06/10/20 0600  metroNIDAZOLE (FLAGYL) tablet 500 mg  Status:  Discontinued        500 mg Oral Every 8 hours 06/10/20 0406 06/10/20 0948   06/10/20 0415  aztreonam (AZACTAM) 2 g in sodium chloride 0.9 % 100 mL IVPB        2 g 200 mL/hr over 30 Minutes Intravenous  Once 06/10/20 0406 06/10/20 0546   06/10/20 0415  vancomycin (VANCOCIN) IVPB 1000 mg/200 mL premix  Status:  Discontinued        1,000 mg 200 mL/hr over 60 Minutes Intravenous  Once 06/10/20 0406 06/10/20 0412   06/10/20 0415  vancomycin (VANCOCIN) 2,500 mg in sodium chloride 0.9 % 500 mL IVPB        2,500 mg 250 mL/hr over 120 Minutes Intravenous  Once 06/10/20 0412 06/10/20 0903      Medications: Scheduled Meds: . amiodarone  200 mg Per Tube Daily  . atorvastatin  20 mg  Per Tube QPM  . chlorhexidine gluconate (MEDLINE KIT)  15 mL Mouth Rinse BID  . Chlorhexidine Gluconate Cloth  6 each Topical Daily  . docusate  100 mg Per Tube BID  . feeding supplement (PROSource TF)  90 mL Per Tube QID  . fentaNYL      . free water  400 mL Per Tube Q4H  . insulin aspart  0-20 Units Subcutaneous Q4H  . insulin aspart  17 Units Subcutaneous Q4H  . insulin detemir  40 Units Subcutaneous Q12H  . levothyroxine  75 mcg Per Tube Daily  . mouth rinse  15 mL Mouth Rinse 10 times per day  . nortriptyline  25 mg Per Tube BID  . pantoprazole sodium  40 mg Per Tube Q24H  . polyethylene glycol  17 g Per Tube BID  . pregabalin  300 mg Per Tube BID  . sennosides  10 mL Per Tube BID  . sodium chloride flush  10-40 mL Intracatheter Q12H  . sodium chloride flush  3 mL Intravenous Q12H   Continuous Infusions: . sodium chloride    . ampicillin-sulbactam (UNASYN) IV 3 g (06/25/20 1048)  . dextrose 40 mL/hr at 06/25/20 0500  . feeding supplement (PIVOT 1.5 CAL) 1,000 mL (06/24/20 2152)  . fentaNYL infusion INTRAVENOUS Stopped (06/25/20 0135)  . heparin 3,800 Units/hr (06/25/20 1050)  . norepinephrine (LEVOPHED) Adult infusion 5 mcg/min (06/25/20 0500)  . phenylephrine (NEO-SYNEPHRINE) Adult infusion Stopped (06/22/20 0940)  . propofol (DIPRIVAN) infusion 35 mcg/kg/min (06/20/20 0859)   PRN Meds:.sodium chloride, acetaminophen (TYLENOL) oral liquid 160 mg/5 mL, albuterol, bisacodyl, diphenhydrAMINE, EPINEPHrine, fentaNYL (SUBLIMAZE) injection, midazolam, [DISCONTINUED] ondansetron **OR** ondansetron (ZOFRAN) IV, sodium chloride flush    Objective: Weight change: 3.175 kg  Intake/Output Summary (Last 24 hours) at 06/25/2020 1133 Last data filed at 06/25/2020 0818 Gross per 24 hour  Intake 5951.47 ml  Output 1952 ml  Net 3999.47 ml   Blood pressure (!) 96/44, pulse (!) 109, temperature 98.9 F (37.2 C), temperature source Core (Comment), resp. rate 18, height _0  (1.778 m),  weight (!) 277.1 kg, SpO2 95 %.  Temp:  [98.6 F (37 C)-101 F (38.3 C)] 98.9 F (37.2 C) (01/25 0813) Pulse Rate:  [109-118] 109 (01/25 0736) Resp:  [12-100] 18 (01/25 1100) BP: (96)/(44) 96/44 (01/24 2324) SpO2:  [93 %-100 %] 95 % (01/25 1100) Arterial Line BP: (86-145)/(36-79) 124/57 (01/25 1130) FiO2 (%):  [80 %-100 %] 80 % (01/25 0736) Weight:  [277.1 kg] 277.1 kg (01/25 0500)  Physical Exam: Physical Exam Constitutional:      Appearance: He is obese. He is ill-appearing.     Interventions: He is intubated.  HENT:     Head: Normocephalic and atraumatic.  Cardiovascular:     Rate and Rhythm: Tachycardia present.  Pulmonary:     Effort: He is intubated.  Musculoskeletal:     Right lower leg: Edema present.     Left lower leg: Edema present.  Skin:    Coloration: Skin is pale.  Neurological:     General: No focal deficit present.          Perineal wound from pictures Saturday     Wound from 06/24/2020 picture from General Surgery:      CBC:    BMET Recent Labs    06/24/20 1434 06/25/20 0551  NA 157* 155*  K 4.1 4.0  CL 115* 112*  CO2 33* 32  GLUCOSE 222* 134*  BUN 39* 53*  CREATININE 1.11 1.71*  CALCIUM 8.0* 8.3*     Liver Panel  Recent Labs    06/24/20 1434  ALBUMIN 1.6*       Sedimentation Rate No results for input(s): ESRSEDRATE in the last 72 hours. C-Reactive Protein No results for input(s): CRP in the last 72 hours.  Micro Results: Recent Results (from the past 720 hour(s))  SARS CORONAVIRUS 2 (TAT 6-24 HRS) Nasopharyngeal Nasopharyngeal Swab     Status: None   Collection Time: 06/10/20  2:44 AM   Specimen: Nasopharyngeal Swab  Result Value Ref Range Status   SARS Coronavirus 2 NEGATIVE NEGATIVE Final    Comment: (NOTE) SARS-CoV-2 target nucleic acids are NOT DETECTED.  The SARS-CoV-2 RNA is generally detectable in upper and lower respiratory specimens during the acute phase of infection. Negative results do not  preclude SARS-CoV-2 infection, do not rule out co-infections with other pathogens, and should not be used as the sole basis for treatment or other patient management decisions. Negative results must be combined with clinical observations, patient history, and epidemiological information. The expected result is Negative.  Fact Sheet for Patients: SugarRoll.be  Fact Sheet for Healthcare Providers: https://www.woods-mathews.com/  This test is not yet approved or cleared by the [place] FDA and  has been authorized for detection and/or diagnosis of SARS-CoV-2 by FDA under an Emergency Use Authorization (EUA). This EUA will remain  in effect (meaning this test can be used) for the duration of the COVID-19 declaration under Se ction 564(b)(1) of the Act, 21 U.S.C. section 360bbb-3(b)(1), unless the authorization is terminated or revoked sooner.  Performed at [facility], [place] [address] [place], [place] [address]   Resp Panel by RT-PCR (Flu A&B, Covid) Nasopharyngeal Swab     Status: None   Collection Time: 06/10/20  2:44 AM   Specimen: Nasopharyngeal Swab; Nasopharyngeal(NP) swabs in vial transport medium  Result Value Ref Range Status   SARS Coronavirus 2 by RT PCR NEGATIVE NEGATIVE Final    Comment: (NOTE) SARS-CoV-2 target nucleic acids are NOT DETECTED.  The SARS-CoV-2 RNA is generally detectable in upper respiratory specimens during the acute phase  of infection. The lowest concentration of SARS-CoV-2 viral copies this assay can detect is 138 copies/mL. A negative result does not preclude SARS-Cov-2 infection and should not be used as the sole basis for treatment or other patient management decisions. A negative result may occur with  improper specimen collection/handling, submission of specimen other than nasopharyngeal swab, presence of viral mutation(s) within the areas targeted by this assay, and inadequate number of  viral copies(<138 copies/mL). A negative result must be combined with clinical observations, patient history, and epidemiological information. The expected result is Negative.  Fact Sheet for Patients:  EntrepreneurPulse.com.au  Fact Sheet for Healthcare Providers:  IncredibleEmployment.be  This test is no t yet approved or cleared by the [place] FDA and  has been authorized for detection and/or diagnosis of SARS-CoV-2 by FDA under an Emergency Use Authorization (EUA). This EUA will remain  in effect (meaning this test can be used) for the duration of the COVID-19 declaration under Section 564(b)(1) of the Act, 21 U.S.C.section 360bbb-3(b)(1), unless the authorization is terminated  or revoked sooner.       Influenza A by PCR NEGATIVE NEGATIVE Final   Influenza B by PCR NEGATIVE NEGATIVE Final    Comment: (NOTE) The Xpert Xpress SARS-CoV-2/FLU/RSV plus assay is intended as an aid in the diagnosis of influenza from Nasopharyngeal swab specimens and should not be used as a sole basis for treatment. Nasal washings and aspirates are unacceptable for Xpert Xpress SARS-CoV-2/FLU/RSV testing.  Fact Sheet for Patients: EntrepreneurPulse.com.au  Fact Sheet for Healthcare Providers: IncredibleEmployment.be  This test is not yet approved or cleared by the [place] FDA and has been authorized for detection and/or diagnosis of SARS-CoV-2 by FDA under an Emergency Use Authorization (EUA). This EUA will remain in effect (meaning this test can be used) for the duration of the COVID-19 declaration under Section 564(b)(1) of the Act, 21 U.S.C. section 360bbb-3(b)(1), unless the authorization is terminated or revoked.  Performed at [facility], [place] [address] [place], [place] [address]   Culture, blood (single)     Status: None   Collection Time: 06/10/20  4:04 AM   Specimen: BLOOD  Result Value  Ref Range Status   Specimen Description BLOOD LEFT ANTECUBITAL  Final   Special Requests   Final    BOTTLES DRAWN AEROBIC AND ANAEROBIC Blood Culture adequate volume   Culture   Final    NO GROWTH 5 DAYS Performed at [facility], [place] [address], [place], [place] [address]    Report Status 06/15/2020 FINAL  Final  Aerobic/Anaerobic Culture (surgical/deep wound)     Status: None   Collection Time: 06/10/20  1:18 PM   Specimen: PATH Other; Tissue  Result Value Ref Range Status   Specimen Description TISSUE  Final   Special Requests LEFT GLUTEAL SPEC 1  Final   Gram Stain   Final    FEW WBC PRESENT, PREDOMINANTLY PMN ABUNDANT GRAM POSITIVE COCCI MODERATE GRAM NEGATIVE RODS    Culture   Final    RARE GROUP B STREP(S.AGALACTIAE)ISOLATED TESTING AGAINST S. AGALACTIAE NOT ROUTINELY PERFORMED DUE TO PREDICTABILITY OF AMP/PEN/VAN SUSCEPTIBILITY. FEW ACTINOMYCES SPECIES ABUNDANT PEPTOSTREPTOCOCCUS SPECIES Standardized susceptibility testing for this organism is not available. Performed at [facility], [place] [address] [place], [place] [address]    Report Status 06/16/2020 FINAL  Final  Aerobic/Anaerobic Culture (surgical/deep wound)     Status: None   Collection Time: 06/10/20  1:25 PM   Specimen: PATH Other; Tissue  Result Value Ref Range Status   Specimen Description TISSUE  Final   Special Requests RIGHT GLUTEAL SPEC 2  Final   Gram Stain   Final    NO WBC SEEN ABUNDANT GRAM POSITIVE COCCI FEW GRAM NEGATIVE RODS Performed at [facility], [address], [place], [place] [address]    Culture   Final    RARE ESCHERICHIA COLI RARE GROUP B STREP(S.AGALACTIAE)ISOLATED TESTING AGAINST S. AGALACTIAE NOT ROUTINELY PERFORMED DUE TO PREDICTABILITY OF AMP/PEN/VAN SUSCEPTIBILITY. RARE STREPTOCOCCUS INFANTARIUS RARE STREPTOCOCCUS CONSTELLATUS ABUNDANT PEPTOSTREPTOCOCCUS SPECIES    Report Status 06/16/2020 FINAL  Final   Organism ID, Bacteria ESCHERICHIA COLI  Final    Organism ID, Bacteria STREPTOCOCCUS INFANTARIUS  Final      Susceptibility   Escherichia coli - MIC*    AMPICILLIN <=2 SENSITIVE Sensitive     CEFAZOLIN <=4 SENSITIVE Sensitive     CEFEPIME <=0.12 SENSITIVE Sensitive     CEFTAZIDIME <=1 SENSITIVE Sensitive     CEFTRIAXONE <=0.25 SENSITIVE Sensitive     CIPROFLOXACIN <=0.25 SENSITIVE Sensitive     GENTAMICIN <=1 SENSITIVE Sensitive     IMIPENEM <=0.25 SENSITIVE Sensitive     TRIMETH/SULFA <=20 SENSITIVE Sensitive     AMPICILLIN/SULBACTAM <=2 SENSITIVE Sensitive     PIP/TAZO <=4 SENSITIVE Sensitive     * RARE ESCHERICHIA COLI   Streptococcus infantarius - MIC*    PENICILLIN <=0.06 SENSITIVE Sensitive     CEFTRIAXONE <=0.12 SENSITIVE Sensitive     LEVOFLOXACIN 4 INTERMEDIATE Intermediate     VANCOMYCIN 0.5 SENSITIVE Sensitive     * RARE STREPTOCOCCUS INFANTARIUS  MRSA PCR Screening     Status: None   Collection Time: 06/13/20  2:38 PM   Specimen: Nasal Mucosa; Nasopharyngeal  Result Value Ref Range Status   MRSA by PCR NEGATIVE NEGATIVE Final    Comment:        The GeneXpert MRSA Assay (FDA approved for NASAL specimens only), is one component of a comprehensive MRSA colonization surveillance program. It is not intended to diagnose MRSA infection nor to guide or monitor treatment for MRSA infections. Performed at [facility], [place] [address], [place], [place] [address]   Culture, bal-quantitative     Status: Abnormal   Collection Time: 06/19/20  5:19 PM   Specimen: Bronchoalveolar Lavage; Respiratory  Result Value Ref Range Status   Specimen Description BRONCHIAL ALVEOLAR LAVAGE  Final   Special Requests Normal  Final   Gram Stain   Final    FEW WBC PRESENT, PREDOMINANTLY PMN NO ORGANISMS SEEN    Culture (A)  Final    1,000 COLONIES/mL Consistent with normal respiratory flora. Performed at [facility], [place] [address] [place],  [address]    Report Status 06/21/2020 FINAL  Final     Studies/Results: CT HEAD WO CONTRAST  Result Date: 06/25/2020 CLINICAL DATA:  Altered mental status, streptococcal sepsis EXAM: CT HEAD WITHOUT CONTRAST TECHNIQUE: Contiguous axial images were obtained from the base of the skull through the vertex without intravenous contrast. COMPARISON:  None. FINDINGS: Brain: Normal anatomic configuration. No abnormal intra or extra-axial mass lesion or fluid collection. No abnormal mass effect or midline shift. No evidence of acute intracranial hemorrhage or infarct. Ventricular size is normal. Cerebellum unremarkable. Vascular: Unremarkable Skull: Intact Sinuses/Orbits: Minimal layering mucus within the sphenoid sinuses is noted, nonspecific in the setting of intubation. The remaining paranasal sinuses are clear. Orbits are unremarkable. Other: There is fluid opacification of the mastoid air cells and  middle ear cavities bilaterally without superimposed osseous erosion. IMPRESSION: No acute intracranial abnormality. Fluid opacification of the mastoid air cells and middle ear cavities. Electronically Signed   By: Fidela Salisbury MD   On: 06/25/2020 05:09      Assessment/Plan:  INTERVAL HISTORY:    Patient not responsive (did open eyes to RN voice)    Principal Problem:   Fournier gangrene Active Problems:   Severe sepsis with septic shock (HCC)   DKA (diabetic ketoacidosis) (HCC)   AKI (acute kidney injury) ([place])   Diabetic ulcer of heel (HCC)   Atrial fibrillation, chronic ([place])   Essential hypertension   Dyslipidemia   Acquired hypothyroidism   Chronic pain disorder   Morbid obesity with BMI of 60.0-69.9, adult ([place])   Acute respiratory failure ([place])   Sepsis due to Streptococcus, group B ([place])   Actinomycosis    Carl Hill is a 50 y.o. male with polymicrobial Fournier's gangrene with septic shock status post debridement by surgery twice who had a high fever to 105 degrees Saturday.  He was imaged and has reevaluated and  closely  by general surgery.  His fevers have defervesced after we placed the cooling blanket underneath him again.  Apparently the cooling blanket had been dislodged on the night prior to his high temperature of 105 degrees.   He tolerated amoxicillin challenge and now tolerating Unasyn  Would continue the unasyn and then if and when he is well enough to leave the hospital change to augmentin and due actinomyces being present plan on him having at least 6 months of therapy targetted at this and would ensure he leaves the hospital with a 30-day supply with him.    Continue vigilant monitoring of the wound  PCN allergy: Was remote and he is tolerating Unasyn  Unresponsiveness: I suspect this is multifactorial hopefully he recovers defer to primary team  I am making a hospital follow-up for him approximately a month from now.    Carl Hill has an appointment on 07/26/2020 at 330PM  The [facility] is located in the [facility] at  [place] in [place].  [address], which is located to the left of the elevators.  Phone: [phone]  Fax: [phone]  [email or web address]  He should arrive 15 minutes prior to his appointment.  I will sign off for now please call with further questions.     : LOS: 15 days   Alcide Evener 06/25/2020, 11:33 AM

## 2020-06-25 NOTE — Progress Notes (Signed)
Returned to 2M11 from CT Head without complication, with help of 2 Respiratory Therapists.

## 2020-06-25 NOTE — Progress Notes (Signed)
Updated patient's mother, Jamesetta So, at bedside on patient's clinical status and plan of care.   Tim Lair, PA-C [facility] 06/25/20 3:40 PM  Please see [email or web address] for pager details.

## 2020-06-25 NOTE — Progress Notes (Signed)
ANTICOAGULATION CONSULT NOTE - Follow Up Consult  Pharmacy Consult for Heparin Indication: atrial fibrillation  Patient Measurements: Height: 5\' 10"  (177.8 cm) Weight: (!) 277.1 kg (611 lb) IBW/kg (Calculated) : 73 Heparin Dosing Weight: 149.7 kg  Vital Signs: Temp: 98.9 F (37.2 C) (01/25 0813) Temp Source: Core (Comment) (01/25 0813) BP: 96/44 (01/24 2324) Pulse Rate: 109 (01/25 0736)  Labs: Recent Labs    06/23/20 0432 06/23/20 0433 06/23/20 1204 06/23/20 2008 06/24/20 0439 06/24/20 1434 06/24/20 1817 06/25/20 0551  HGB 7.8*  --  8.5*  --  8.4*  --   --  9.0*  HCT 27.6*  --  25.0*  --  30.6*  --   --  34.3*  PLT 209  --   --   --  219  --   --  232  HEPARINUNFRC  --    < >  --    < > 0.52  --  0.51 0.41  CREATININE 1.20  --   --   --  1.06 1.11  --  1.71*   < > = values in this interval not displayed.    Estimated Creatinine Clearance: 115.5 mL/min (A) (by C-G formula based on SCr of 1.71 mg/dL (H)).   Assessment: 49YOM admitted with septic shock secondary to necrotizing fascitis, on heparin for afib.  The patient was requiring two heparin bags to achieve a therapeutic rate - changing to one heparin bag today due to rate of infusion. Nursing is aware per conversation. CBC stable, PLT wnl. Per RN no issues with bleeding or infusion. Heparin level is [place].  Goal of Therapy:  Heparin level 0.3-0.7 units/ml Monitor platelets by anticoagulation protocol: Yes   Plan:  - Continue Heparin at 3800 units/hr (RN running 1 bag) - Will continue to monitor for any signs/symptoms of bleeding and will follow up with heparin level with morning labs. - daily HL, CBC  Thank you for allowing pharmacy to be a part of this patient's care.  06/27/20, PharmD PGY1 Pharmacy Resident 06/25/2020 8:28 AM

## 2020-06-25 NOTE — Progress Notes (Signed)
Progress Note  14 Days Post-Op  Subjective: Saw for dressing change with bedside nursing. Patient with large volume liquid stool noted in bed.   Objective: Vital signs in last 24 hours: Temp:  [98.6 F (37 C)-101 F (38.3 C)] 98.9 F (37.2 C) (01/25 0813) Pulse Rate:  [109-118] 109 (01/25 0736) Resp:  [12-100] 16 (01/25 0600) BP: (96)/(44) 96/44 (01/24 2324) SpO2:  [93 %-100 %] 97 % (01/25 0736) Arterial Line BP: (86-145)/(36-79) 128/58 (01/25 0600) FiO2 (%):  [80 %-100 %] 80 % (01/25 0736) Weight:  [277.1 kg] 277.1 kg (01/25 0500) Last BM Date: 06/24/20  Intake/Output from previous day: 01/24 0701 - 01/25 0700 In: 3858.6 [I.V.:1712; GU/YQ:0347; IV Piggyback:205.6] Out: 2719 [Urine:2719] Intake/Output this shift: Total I/O In: 2800 [NG/GT:2800] Out: 200 [Urine:200]  PE: Gen: Intubated and sedated Heart:reg rate Lungs: On vent Abdomen: soft, obese nontender Perirectal/perineal wound:Wound bedwith some fibrinous exudate and some beefy granulation tissue, some necrotic fatty tissue that is liquifying,as noted in the picture below. Wound drainage is mostly serosanguinous. There is no surrounding blanching erythema.   Lab Results:  Recent Labs    06/24/20 0439 06/25/20 0551  WBC 10.4 12.7*  HGB 8.4* 9.0*  HCT 30.6* 34.3*  PLT 219 232   BMET Recent Labs    06/24/20 1434 06/25/20 0551  NA 157* 155*  K 4.1 4.0  CL 115* 112*  CO2 33* 32  GLUCOSE 222* 134*  BUN 39* 53*  CREATININE 1.11 1.71*  CALCIUM 8.0* 8.3*   PT/INR No results for input(s): LABPROT, INR in the last 72 hours. CMP     Component Value Date/Time   NA 155 (H) 06/25/2020 0551   K 4.0 06/25/2020 0551   CL 112 (H) 06/25/2020 0551   CO2 32 06/25/2020 0551   GLUCOSE 134 (H) 06/25/2020 0551   BUN 53 (H) 06/25/2020 0551   CREATININE 1.71 (H) 06/25/2020 0551   CALCIUM 8.3 (L) 06/25/2020 0551   PROT 5.8 (L) 06/15/2020 0555   ALBUMIN 1.6 (L) 06/24/2020 1434   AST 96 (H) 06/15/2020  0555   ALT 532 (H) 06/15/2020 0555   ALKPHOS 77 06/15/2020 0555   BILITOT 1.7 (H) 06/15/2020 0555   GFRNONAA 49 (L) 06/25/2020 0551   Lipase  No results found for: LIPASE     Studies/Results: CT HEAD WO CONTRAST  Result Date: 06/25/2020 CLINICAL DATA:  Altered mental status, streptococcal sepsis EXAM: CT HEAD WITHOUT CONTRAST TECHNIQUE: Contiguous axial images were obtained from the base of the skull through the vertex without intravenous contrast. COMPARISON:  None. FINDINGS: Brain: Normal anatomic configuration. No abnormal intra or extra-axial mass lesion or fluid collection. No abnormal mass effect or midline shift. No evidence of acute intracranial hemorrhage or infarct. Ventricular size is normal. Cerebellum unremarkable. Vascular: Unremarkable Skull: Intact Sinuses/Orbits: Minimal layering mucus within the sphenoid sinuses is noted, nonspecific in the setting of intubation. The remaining paranasal sinuses are clear. Orbits are unremarkable. Other: There is fluid opacification of the mastoid air cells and middle ear cavities bilaterally without superimposed osseous erosion. IMPRESSION: No acute intracranial abnormality. Fluid opacification of the mastoid air cells and middle ear cavities. Electronically Signed   By: Helyn Numbers MD   On: 06/25/2020 05:09    Anti-infectives: Anti-infectives (From admission, onward)   Start     Dose/Rate Route Frequency Ordered Stop   06/24/20 1615  Ampicillin-Sulbactam (UNASYN) 3 g in sodium chloride 0.9 % 100 mL IVPB        3 g 200  mL/hr over 30 Minutes Intravenous Every 6 hours 06/24/20 1517     06/24/20 1300  amoxicillin (AMOXIL) capsule 500 mg        500 mg Oral  Once 06/24/20 1241 06/24/20 1256   06/24/20 1115  amoxicillin (AMOXIL) capsule 500 mg  Status:  Discontinued        500 mg Oral  Once 06/24/20 1025 06/24/20 1241   06/22/20 1415  meropenem (MERREM) 2 g in sodium chloride 0.9 % 100 mL IVPB  Status:  Discontinued        2 g 200 mL/hr  over 30 Minutes Intravenous Every 8 hours 06/22/20 1324 06/24/20 1517   06/22/20 1400  meropenem (MERREM) 1 g in sodium chloride 0.9 % 100 mL IVPB  Status:  Discontinued        1 g 200 mL/hr over 30 Minutes Intravenous Every 8 hours 06/22/20 1248 06/22/20 1324   06/22/20 1300  doxycycline (VIBRAMYCIN) 100 mg in sodium chloride 0.9 % 250 mL IVPB  Status:  Discontinued        100 mg 125 mL/hr over 120 Minutes Intravenous Every 12 hours 06/22/20 1212 06/22/20 1248   06/20/20 1100  vancomycin (VANCOCIN) 2,500 mg in sodium chloride 0.9 % 500 mL IVPB  Status:  Discontinued        2,500 mg 250 mL/hr over 120 Minutes Intravenous Every 12 hours 06/20/20 1007 06/22/20 0813   06/20/20 1100  ceFEPIme (MAXIPIME) 2 g in sodium chloride 0.9 % 100 mL IVPB  Status:  Discontinued        2 g 200 mL/hr over 30 Minutes Intravenous Every 8 hours 06/20/20 1007 06/22/20 1207   06/17/20 1130  metroNIDAZOLE (FLAGYL) IVPB 500 mg  Status:  Discontinued        500 mg 100 mL/hr over 60 Minutes Intravenous Every 8 hours 06/17/20 1041 06/22/20 1248   06/13/20 1400  cefTRIAXone (ROCEPHIN) 2 g in sodium chloride 0.9 % 100 mL IVPB  Status:  Discontinued        2 g 200 mL/hr over 30 Minutes Intravenous Every 24 hours 06/13/20 1242 06/20/20 0944   06/11/20 1300  vancomycin (VANCOREADY) IVPB 2000 mg/400 mL        2,000 mg 200 mL/hr over 120 Minutes Intravenous  Once 06/11/20 1212 06/11/20 1824   06/10/20 2000  clindamycin (CLEOCIN) IVPB 900 mg        900 mg 100 mL/hr over 30 Minutes Intravenous Every 8 hours 06/10/20 1307 06/13/20 1500   06/10/20 1600  ceFEPIme (MAXIPIME) 2 g in sodium chloride 0.9 % 100 mL IVPB  Status:  Discontinued        2 g 200 mL/hr over 30 Minutes Intravenous Every 12 hours 06/10/20 1504 06/13/20 1242   06/10/20 1400  metroNIDAZOLE (FLAGYL) IVPB 500 mg  Status:  Discontinued        500 mg 100 mL/hr over 60 Minutes Intravenous Every 8 hours 06/10/20 1023 06/10/20 1308   06/10/20 1300  aztreonam  (AZACTAM) 2 g in sodium chloride 0.9 % 100 mL IVPB  Status:  Discontinued        2 g 200 mL/hr over 30 Minutes Intravenous Every 8 hours 06/10/20 1033 06/10/20 1504   06/10/20 1215  clindamycin (CLEOCIN) IVPB 900 mg        900 mg 100 mL/hr over 30 Minutes Intravenous To Surgery 06/10/20 1210 06/10/20 1210   06/10/20 1033  vancomycin variable dose per unstable renal function (pharmacist dosing)  Status:  Discontinued  Does not apply See admin instructions 06/10/20 1033 06/12/20 1252   06/10/20 0600  metroNIDAZOLE (FLAGYL) tablet 500 mg  Status:  Discontinued        500 mg Oral Every 8 hours 06/10/20 0406 06/10/20 0948   06/10/20 0415  aztreonam (AZACTAM) 2 g in sodium chloride 0.9 % 100 mL IVPB        2 g 200 mL/hr over 30 Minutes Intravenous  Once 06/10/20 0406 06/10/20 0546   06/10/20 0415  vancomycin (VANCOCIN) IVPB 1000 mg/200 mL premix  Status:  Discontinued        1,000 mg 200 mL/hr over 60 Minutes Intravenous  Once 06/10/20 0406 06/10/20 0412   06/10/20 0415  vancomycin (VANCOCIN) 2,500 mg in sodium chloride 0.9 % 500 mL IVPB        2,500 mg 250 mL/hr over 120 Minutes Intravenous  Once 06/10/20 0412 06/10/20 0903       Assessment/Plan A. Fib VT Arrest (1/10) VDRF DM2 Hypothyroidism Hx HTN  50 yo male with septic shock secondary to perianal/perineal necrotizing soft tissue infection-s/p operative debridement x2(1/10 and 1/11) - Wound examined at bedside today. No sharp debridement was required. Underlying tissuewith some fat necrosis but no operative debridement needed - Continue saline wet-to-dry dressings, currently being changed daily.Rectal tube replaced after disimpaction of several hard stool balls today. May need to manually disimpact and/or remove rectal tube if not having much stool output - Antibiotics per ID - currently on merrem  - Appreciate CCM's assistance in management of patient shock and MMP - Surgery will continue to follow closely-wewill  see againFriday - PATIENT IS NOT A CANDIDATE FOR COLOSTOMY SECONDARY TO BODY HABITUS - VTE: on heparin gtt for a-fib  LOS: 15 days    Juliet Rude , [facility] 06/25/2020, 11:08 AM Please see Amion for pager number during day hours 7:00am-4:30pm

## 2020-06-25 NOTE — Progress Notes (Signed)
Notified E-link patient still not responding to pain after having Fentanyl drip off for 1hr. Still only moves mouth to mouthcare, no other movement.

## 2020-06-25 NOTE — Progress Notes (Signed)
[facility] PROGRESS NOTE  Assessment/ Plan: Pt is a 50 y.o. yo male  with morbid obesity, hypertension, DM, A. fib who was initially admitted on 1/10 for necrotizing fasciitis of left buttock and perineum underwent I&D, seen as a consultation for fluid overload and hypernatremia.  #Volume overload/anasarca:  Appears to be autodiuresing now with improvement in AKI.  Held diuretics since 1/23 but will likely need them resumed soon as UOP is down over past few days.  I think this afternoon v tomorrow AM would be reasonable.  #Acute kidney injury, nonoliguric in the setting of septic shock: AKI improved - peak was 4.9 on 06/13/20.  Cr improved to 1.1. Avoid IV contrast and nephrotoxins if possible.  Maintain BP.    #Hypernatremia: Problem with urinary concentration due to recovering AKI/ATN + osmotic diuresis post ATN.  Doubled FWF this yesterday from 400 Q8 to Q4.   This should work itself out as his polyuria resolves.    #Septic shock due to necrotizing fasciitis, HCAP: Was on meropenem, now being challenged with amp/sulbactam.  ID following.  #Acute on chronic hypoxic/hypercapnic respiratory failure: cont on vent support.  Diuresis as discussed above.  #Acute metabolic encephalopathy due to sepsis, sedation, per primary.  Noted head CT thsi AM.   #Hypokalemia: Improved.  I'll sign off given resolution of AKI.  His hypernatremia can be treated with free water per PCCM - the dose of free water should stabilize through the week once his serum sodium is corrected as his post ATN diuresis/polyuria has slowed down.   Subjective:  UOP 2.7L down from 6.6L and 12L+ days prior.  Serum sodium 157 yesterday PM, this mornings still pending.  He was taken for head CT early this AM due to AMS after sedation held.  The scan was unrevealing.    Objective Vital signs in last 24 hours: Vitals:   06/25/20 0400 06/25/20 0452 06/25/20 0500 06/25/20 0600  BP:      Pulse:       Resp: (!) '23 19 18 16  ' Temp:   100.1 F (37.8 C)   TempSrc:   Esophageal   SpO2: 100% 98% 97% 100%  Weight:   (!) 277.1 kg   Height:       Weight change: 3.175 kg  Intake/Output Summary (Last 24 hours) at 06/25/2020 0642 Last data filed at 06/25/2020 0545 Gross per 24 hour  Intake 3986.51 ml  Output 2961 ml  Net 1025.51 ml       Labs: Basic Metabolic Panel: Recent Labs  Lab 06/23/20 0432 06/23/20 1204 06/24/20 0439 06/24/20 1434  NA 156* 160* 159* 157*  K 3.2* 3.5 3.4* 4.1  CL 116*  --  116* 115*  CO2 31  --  33* 33*  GLUCOSE 145*  --  171* 222*  BUN 37*  --  35* 39*  CREATININE 1.20  --  1.06 1.11  CALCIUM 7.7*  --  8.1* 8.0*  PHOS  --   --   --  4.6   Liver Function Tests: Recent Labs  Lab 06/24/20 1434  ALBUMIN 1.6*   No results for input(s): LIPASE, AMYLASE in the last 168 hours. No results for input(s): AMMONIA in the last 168 hours. CBC: Recent Labs  Lab 06/20/20 0519 06/21/20 0425 06/22/20 0421 06/23/20 0432 06/23/20 1204 06/24/20 0439  WBC 18.3* 20.6* 16.8* 12.3*  --  10.4  HGB 8.0* 8.1* 8.1* 7.8* 8.5* 8.4*  HCT 26.8* 27.7* 27.5* 27.6* 25.0* 30.6*  MCV 96.1  99.6 97.5 98.6  --  100.0  PLT 300 269 268 209  --  219   Cardiac Enzymes: No results for input(s): CKTOTAL, CKMB, CKMBINDEX, TROPONINI in the last 168 hours. CBG: Recent Labs  Lab 06/24/20 1156 06/24/20 1537 06/24/20 2039 06/24/20 2342 06/25/20 0304  GLUCAP 158* 189* 147* 180* 158*    Iron Studies: No results for input(s): IRON, TIBC, TRANSFERRIN, FERRITIN in the last 72 hours. Studies/Results: CT HEAD WO CONTRAST  Result Date: 06/25/2020 CLINICAL DATA:  Altered mental status, streptococcal sepsis EXAM: CT HEAD WITHOUT CONTRAST TECHNIQUE: Contiguous axial images were obtained from the base of the skull through the vertex without intravenous contrast. COMPARISON:  None. FINDINGS: Brain: Normal anatomic configuration. No abnormal intra or extra-axial mass lesion or fluid  collection. No abnormal mass effect or midline shift. No evidence of acute intracranial hemorrhage or infarct. Ventricular size is normal. Cerebellum unremarkable. Vascular: Unremarkable Skull: Intact Sinuses/Orbits: Minimal layering mucus within the sphenoid sinuses is noted, nonspecific in the setting of intubation. The remaining paranasal sinuses are clear. Orbits are unremarkable. Other: There is fluid opacification of the mastoid air cells and middle ear cavities bilaterally without superimposed osseous erosion. IMPRESSION: No acute intracranial abnormality. Fluid opacification of the mastoid air cells and middle ear cavities. Electronically Signed   By: Fidela Salisbury MD   On: 06/25/2020 05:09    Medications: Infusions: . sodium chloride    . ampicillin-sulbactam (UNASYN) IV 3 g (06/25/20 0545)  . dextrose 40 mL/hr at 06/25/20 0500  . feeding supplement (PIVOT 1.5 CAL) 1,000 mL (06/24/20 2152)  . fentaNYL infusion INTRAVENOUS Stopped (06/25/20 0135)  . heparin 1,900 Units/hr (06/25/20 0500)   And  . heparin 1,900 Units/hr (06/25/20 0500)  . norepinephrine (LEVOPHED) Adult infusion 5 mcg/min (06/25/20 0500)  . phenylephrine (NEO-SYNEPHRINE) Adult infusion Stopped (06/22/20 0940)  . propofol (DIPRIVAN) infusion 35 mcg/kg/min (06/20/20 0859)    Scheduled Medications: . amiodarone  200 mg Per Tube Daily  . atorvastatin  20 mg Per Tube QPM  . chlorhexidine gluconate (MEDLINE KIT)  15 mL Mouth Rinse BID  . Chlorhexidine Gluconate Cloth  6 each Topical Daily  . docusate  100 mg Per Tube BID  . feeding supplement (PROSource TF)  90 mL Per Tube QID  . free water  400 mL Per Tube Q4H  . insulin aspart  0-20 Units Subcutaneous Q4H  . insulin aspart  17 Units Subcutaneous Q4H  . insulin detemir  40 Units Subcutaneous Q12H  . levothyroxine  75 mcg Per Tube Daily  . mouth rinse  15 mL Mouth Rinse 10 times per day  . nortriptyline  25 mg Per Tube BID  . pantoprazole sodium  40 mg Per Tube Q24H   . polyethylene glycol  17 g Per Tube BID  . pregabalin  300 mg Per Tube BID  . sennosides  10 mL Per Tube BID  . sodium chloride flush  10-40 mL Intracatheter Q12H  . sodium chloride flush  3 mL Intravenous Q12H    have reviewed scheduled and prn medications.  Physical Exam: General: Morbidly obese male intubated and sedated Heart:RRR, s1s2 nl Lungs: Coarse breath sound bilateral Abdomen:soft,distended Extremities anasarca Dialysis Access: None  Ria Comment A Alexi Dorminey 06/25/2020,6:42 AM  LOS: 15 days

## 2020-06-26 ENCOUNTER — Inpatient Hospital Stay (HOSPITAL_COMMUNITY): Payer: Medicaid Other

## 2020-06-26 DIAGNOSIS — J9601 Acute respiratory failure with hypoxia: Secondary | ICD-10-CM | POA: Diagnosis not present

## 2020-06-26 DIAGNOSIS — T17500A Unspecified foreign body in bronchus causing asphyxiation, initial encounter: Secondary | ICD-10-CM | POA: Diagnosis not present

## 2020-06-26 DIAGNOSIS — N179 Acute kidney failure, unspecified: Secondary | ICD-10-CM | POA: Diagnosis not present

## 2020-06-26 DIAGNOSIS — N493 Fournier gangrene: Secondary | ICD-10-CM | POA: Diagnosis not present

## 2020-06-26 DIAGNOSIS — J9602 Acute respiratory failure with hypercapnia: Secondary | ICD-10-CM | POA: Diagnosis not present

## 2020-06-26 LAB — CBC
HCT: 31.2 % — ABNORMAL LOW (ref 39.0–52.0)
Hemoglobin: 8.7 g/dL — ABNORMAL LOW (ref 13.0–17.0)
MCH: 28.4 pg (ref 26.0–34.0)
MCHC: 27.9 g/dL — ABNORMAL LOW (ref 30.0–36.0)
MCV: 102 fL — ABNORMAL HIGH (ref 80.0–100.0)
Platelets: 186 10*3/uL (ref 150–400)
RBC: 3.06 MIL/uL — ABNORMAL LOW (ref 4.22–5.81)
RDW: 18.1 % — ABNORMAL HIGH (ref 11.5–15.5)
WBC: 9 10*3/uL (ref 4.0–10.5)
nRBC: 1 % — ABNORMAL HIGH (ref 0.0–0.2)

## 2020-06-26 LAB — GLUCOSE, CAPILLARY
Glucose-Capillary: 123 mg/dL — ABNORMAL HIGH (ref 70–99)
Glucose-Capillary: 136 mg/dL — ABNORMAL HIGH (ref 70–99)
Glucose-Capillary: 145 mg/dL — ABNORMAL HIGH (ref 70–99)
Glucose-Capillary: 149 mg/dL — ABNORMAL HIGH (ref 70–99)
Glucose-Capillary: 161 mg/dL — ABNORMAL HIGH (ref 70–99)

## 2020-06-26 LAB — BASIC METABOLIC PANEL
Anion gap: 11 (ref 5–15)
BUN: 63 mg/dL — ABNORMAL HIGH (ref 6–20)
CO2: 31 mmol/L (ref 22–32)
Calcium: 7.9 mg/dL — ABNORMAL LOW (ref 8.9–10.3)
Chloride: 112 mmol/L — ABNORMAL HIGH (ref 98–111)
Creatinine, Ser: 2.2 mg/dL — ABNORMAL HIGH (ref 0.61–1.24)
GFR, Estimated: 36 mL/min — ABNORMAL LOW (ref >60)
Glucose, Bld: 171 mg/dL — ABNORMAL HIGH (ref 70–99)
Potassium: 4 mmol/L (ref 3.5–5.1)
Sodium: 154 mmol/L — ABNORMAL HIGH (ref 135–145)

## 2020-06-26 LAB — HEPARIN LEVEL (UNFRACTIONATED): Heparin Unfractionated: 0.41 IU/mL (ref 0.30–0.70)

## 2020-06-26 LAB — TRIGLYCERIDES: Triglycerides: 98 mg/dL (ref <150)

## 2020-06-26 LAB — HEPATIC FUNCTION PANEL
ALT: 25 U/L (ref 0–44)
AST: 27 U/L (ref 15–41)
Albumin: 1.5 g/dL — ABNORMAL LOW (ref 3.5–5.0)
Alkaline Phosphatase: 44 U/L (ref 38–126)
Bilirubin, Direct: 0.3 mg/dL — ABNORMAL HIGH (ref 0.0–0.2)
Indirect Bilirubin: 0.4 mg/dL (ref 0.3–0.9)
Total Bilirubin: 0.7 mg/dL (ref 0.3–1.2)
Total Protein: 5.8 g/dL — ABNORMAL LOW (ref 6.5–8.1)

## 2020-06-26 LAB — MAGNESIUM: Magnesium: 2.5 mg/dL — ABNORMAL HIGH (ref 1.7–2.4)

## 2020-06-26 MED ORDER — MIDAZOLAM HCL 2 MG/2ML IJ SOLN: 6.0000 mg | Freq: Once | INTRAMUSCULAR | Status: AC

## 2020-06-26 MED ORDER — MIDAZOLAM HCL 2 MG/2ML IJ SOLN: 5.0000 mg | Freq: Once | INTRAMUSCULAR | Status: DC

## 2020-06-26 MED ORDER — FENTANYL CITRATE (PF) 100 MCG/2ML IJ SOLN
INTRAMUSCULAR | Status: AC
  Administered 2020-06-26: 200 ug via INTRAVENOUS
  Filled 2020-06-26: qty 4

## 2020-06-26 MED ORDER — ETOMIDATE 2 MG/ML IV SOLN
40.0000 mg | Freq: Once | INTRAVENOUS | Status: DC
  Administered 2020-06-26: 40 mg via INTRAVENOUS
  Filled 2020-06-26: qty 20

## 2020-06-26 MED ORDER — FENTANYL CITRATE (PF) 100 MCG/2ML IJ SOLN
200.0000 ug | Freq: Once | INTRAMUSCULAR | Status: AC
Start: 2020-06-26 — End: 2020-06-26

## 2020-06-26 MED ORDER — VECURONIUM BROMIDE 10 MG IV SOLR
10.0000 mg | Freq: Once | INTRAVENOUS | Status: AC
  Administered 2020-06-26: 10 mg via INTRAVENOUS
  Filled 2020-06-26: qty 10

## 2020-06-26 MED ORDER — FUROSEMIDE 10 MG/ML IJ SOLN
40.0000 mg | Freq: Two times a day (BID) | INTRAMUSCULAR | Status: AC
  Administered 2020-06-26 (×2): 40 mg via INTRAVENOUS
  Filled 2020-06-26 (×2): qty 4

## 2020-06-26 MED ORDER — FENTANYL CITRATE (PF) 100 MCG/2ML IJ SOLN
INTRAMUSCULAR | Status: AC
  Administered 2020-06-26: 300 ug via INTRAVENOUS
  Filled 2020-06-26: qty 4

## 2020-06-26 MED ORDER — MIDAZOLAM HCL 2 MG/2ML IJ SOLN
INTRAMUSCULAR | Status: AC
  Administered 2020-06-26: 6 mg via INTRAVENOUS
  Filled 2020-06-26: qty 2

## 2020-06-26 MED ORDER — PROPOFOL 10 MG/ML IV BOLUS: 500.0000 mg | Freq: Once | INTRAVENOUS | Status: DC

## 2020-06-26 MED ORDER — MIDAZOLAM HCL 2 MG/2ML IJ SOLN: 5.0000 mg | Freq: Once | INTRAMUSCULAR | Status: AC

## 2020-06-26 MED ORDER — MIDAZOLAM HCL 2 MG/2ML IJ SOLN
INTRAMUSCULAR | Status: AC
  Administered 2020-06-26: 4 mg via INTRAVENOUS
  Filled 2020-06-26: qty 4

## 2020-06-26 MED ORDER — FENTANYL CITRATE (PF) 100 MCG/2ML IJ SOLN: 300.0000 ug | Freq: Once | INTRAMUSCULAR | Status: AC

## 2020-06-26 MED ORDER — FENTANYL CITRATE (PF) 100 MCG/2ML IJ SOLN: 200.0000 ug | Freq: Once | INTRAMUSCULAR | Status: AC

## 2020-06-26 MED ORDER — ETOMIDATE 2 MG/ML IV SOLN: 40.0000 mg | Freq: Once | INTRAVENOUS | Status: AC

## 2020-06-26 MED ORDER — MIDAZOLAM HCL 2 MG/2ML IJ SOLN
INTRAMUSCULAR | Status: AC
  Filled 2020-06-26: qty 4

## 2020-06-26 MED ORDER — ETOMIDATE 2 MG/ML IV SOLN: 40.0000 mg | Freq: Once | INTRAVENOUS | Status: DC

## 2020-06-26 MED ORDER — HEPARIN (PORCINE) 25000 UT/250ML-% IV SOLN
4400.0000 [IU]/h | INTRAVENOUS | Status: DC
  Administered 2020-06-26 – 2020-06-27 (×2): 3800 [IU]/h via INTRAVENOUS
  Administered 2020-06-27 (×2): 3950 [IU]/h via INTRAVENOUS
  Administered 2020-06-27: 3800 [IU]/h via INTRAVENOUS
  Filled 2020-06-26 (×6): qty 250

## 2020-06-26 MED ORDER — FENTANYL CITRATE (PF) 100 MCG/2ML IJ SOLN: 200.0000 ug | Freq: Once | INTRAMUSCULAR | Status: DC

## 2020-06-26 MED ORDER — PROPOFOL 10 MG/ML IV BOLUS
500.0000 mg | Freq: Once | INTRAVENOUS | Status: DC
  Filled 2020-06-26: qty 60

## 2020-06-26 NOTE — Plan of Care (Signed)
PCCM Goals of Care Discussion and Advanced Care Planning:   Date: 06/26/2020   Present Parties: mother at bedside  What was discussed: current medical issues, still requiring vent support and has be 16 days on the ventilator. She is still wanting to pursue every avenue to get him better. We discussed the risks, benefits and alternatives of proceeding the tracheostomy.  Goal: hopeful recovery   Outcome:  FULL code  Plan for tracheostomy   16 mins of time was spent discussing the goals of care, advanced care planning options such as code status.

## 2020-06-26 NOTE — Procedures (Signed)
Bronchoscopy Procedure Note  QUANTRELL SPLITT  [id number]  [birth year]  Date:06/26/20  Time:12:25 PM   Provider Performing:Aivy Akter L Hewitt Garner   Procedure(s):  Flexible bronchoscopy with bronchial alveolar lavage (35465)  Indication(s) Left lung collapse and mucus plugging   Consent Risks of the procedure as well as the alternatives and risks of each were explained to the patient and/or caregiver.  Consent for the procedure was obtained and is signed in the bedside chart  Anesthesia Versed and fentanyl   Time Out Verified patient identification, verified procedure, site/side was marked, verified correct patient position, special equipment/implants available, medications/allergies/relevant history reviewed, required imaging and test results available.   Sterile Technique Usual hand hygiene, masks, gowns, and gloves were used   Procedure Description Bronchoscope advanced through endotracheal tube and into airway.  Airways were examined down to subsegmental level with findings noted below.   Following diagnostic evaluation, bronchial washings within the left main stem were completed as well as therapeutic aspiration of the bilateral mainstems. There was visible mucus plugging of dark tan, thick secretions within the left main stem. No visible distal segments were patent at the start of the procedure. Therapeutic aspiration was completed with removal of the inspissated secretions. Bronchial washings were obtained within the left main stem for cultures.   Findings:  1. Occluded left main stem from thick secretions   Complications/Tolerance None; patient tolerated the procedure well. Chest X-ray is needed post procedure.  EBL 0 cc  Specimen(s) Bronchial washing left main stem  Garner Nash, DO [facility] 06/26/2020 12:28 PM

## 2020-06-26 NOTE — Progress Notes (Signed)
ANTICOAGULATION CONSULT NOTE - Follow Up Consult  Pharmacy Consult for Heparin Indication: atrial fibrillation  Patient Measurements: Height: 5\' 10"  (177.8 cm) Weight: (!) 277.1 kg (611 lb) IBW/kg (Calculated) : 73 Heparin Dosing Weight: 149.7 kg  Vital Signs: Temp: 98.9 F (37.2 C) (01/26 0800) Temp Source: Esophageal (01/26 0800) BP: 139/55 (01/26 0304) Pulse Rate: 103 (01/26 0304)  Labs: Recent Labs    06/24/20 0439 06/24/20 1434 06/24/20 1817 06/25/20 0551 06/26/20 0219 06/26/20 0220  HGB 8.4*  --   --  9.0* 8.7*  --   HCT 30.6*  --   --  34.3* 31.2*  --   PLT 219  --   --  232 186  --   HEPARINUNFRC 0.52  --  0.51 0.41  --  0.41  CREATININE 1.06 1.11  --  1.71* 2.20*  --     Estimated Creatinine Clearance: 89.8 mL/min (A) (by C-G formula based on SCr of 2.2 mg/dL (H)).   Assessment: 49YOM admitted with septic shock secondary to necrotizing fascitis, on heparin for afib. Therapeutic heparin level, CBC stable, PLT wnl. Per RN no issues with bleeding or infusion.   Goal of Therapy:  Heparin level 0.3-0.7 units/ml Monitor platelets by anticoagulation protocol: Yes   Plan:  - Continue Heparin at 3800 units/hr (RN running 1 bag) - Will continue to monitor for any signs/symptoms of bleeding and will follow up with heparin level with morning labs. - Daily HL, CBC  Thank you for allowing pharmacy to be a part of this patient's care.  06/28/20, PharmD PGY1 Pharmacy Resident 06/26/2020 9:17 AM

## 2020-06-26 NOTE — Procedures (Signed)
Diagnostic Bronchoscopy  Carl Hill  [id number]  10-08-1970  Date:06/26/20  Time:4:52 PM   Provider Performing:Laurenashley Viar C Katrinka Blazing   Procedure: Diagnostic Bronchoscopy (75102)  Indication(s) Assist with direct visualization of tracheostomy placement  Consent Risks of the procedure as well as the alternatives and risks of each were explained to the patient and/or caregiver.  Consent for the procedure was obtained.   Anesthesia See separate tracheostomy note   Time Out Verified patient identification, verified procedure, site/side was marked, verified correct patient position, special equipment/implants available, medications/allergies/relevant history reviewed, required imaging and test results available.   Sterile Technique Usual hand hygiene, masks, gowns, and gloves were used   Procedure Description Bronchoscope advanced through endotracheal tube and into airway.  After suctioning out tracheal secretions, bronchoscope used to provide direct visualization of tracheostomy placement.   Complications/Tolerance ett disloadged with desats, reintubated with glidescope with quick recovery of sats   EBL None  Specimen(s) None

## 2020-06-26 NOTE — Progress Notes (Signed)
On assessment tear in foley catheter standard drainage collection bag noted. Consulted E-Link and due to difficult foley insertion and presence of healing wounds on sacrum, seal was broken, foley catheter standard collection bag replaced.

## 2020-06-26 NOTE — Progress Notes (Addendum)
NAME:  Carl Hill, MRN:  [id number], DOB:  1971/03/20, LOS: 48 ADMISSION DATE:  06/10/2020, CONSULTATION DATE:  06/10/2020 REFERRING MD:  Lorin Mercy, CHIEF COMPLAINT:  Severe Sepsis    Brief History:  50 year old male who presented with buttock pain, fatigue and malaise.  Found to have hyperglycemia. CT pelvis demonstrated necrotizing fasciitis in L buttock, L upper thigh, and perineum. Taken to OR for debridement 1/10 c/b Afib with RVR and VT arrest s/p CPR/defib. Remains intubated on vent support. Pressors resumed 1/25.  Past Medical History:  HTN, obesity, DM, AFib on DOAC  Significant Hospital Events:  1/10  Admitted to [facility]. General surgery consulted and debridement pursued. In the afternoon, patient developed hypotension with AF in RVR. Synchronized cardioversion unsuccessful with conversion to VT. CPR initated with ROSC after desynchronized defibrillation. 1/11 Debridement 1/18 Mucus plugging 1/19 Bronchoscopy >> mucus plug on Rt, airway collapse with exhalation 1/20 Persistent fever, change ABx 1/24 Increased vent requirements, habitus related +/- mucus plugging 1/25 Hypotensive to SBP 80s, Levo resumed, minimally responsive overnight off sedation, CT Head negative 1/26 Stable vent/pressor requirements, bronch, tracheostomy planned  Consults:  General Surgery, ID  Procedures:  ETT 1/10 >>  R IJ CVL 1/10 >>  L brachial A-line 1/10 >>   Significant Diagnostic Tests:   CT abd/pelvis 1/10 >> Extensive subcutaneous emphysema in the medial aspect of the upper thighs extending through perineum and superiorly to the medial left buttock. Gas extends into the pelvis surrounding the anus inferiorly and tracking superiorly along the left wall of the rectum.  CT C/A/P 1/22 >> Imaging limited due to body habitus. No gross fluid collection or gas seen. Bilateral consolidation with dependent atelectasis  CT Head 1/25 >> No acute intracranial abnormality  Micro Data:  COVID/Flu 1/10 >>  negative MRSA PCR 1/10 >> negative L buttocks wound 1/10 >> E. Coli (pansensitive), Streptococcus infantarious (sensitive to PCN, ceftriaxone, vanc), Peptostreptococcus species, rare GBS Blood 1/10 >> negative BAL 1/20 >> normal flora  Antimicrobials:  Aztreonam 1/09 Metronidazole 1/09 Vancomycin 1/09, 1/11, 1/20 >>1/22 Cefepime 1/10 >> 1/13, 1/20 >>1/22 Clindamycin 1/10 >> 1/13 Ceftriaxone 1/13 >> 1/20 Metronidazole 1/17 >> 1/22 Meropenem 1/22 >> 1/25 Amoxicillin 1/24 (x 1 for PCN Allergy challenge) Unasyn 1/25 >>  Interim History / Subjective:  Weaned off of levo this AM, then hypotensive with MAPs 50s Levo resumed at 68mg Remains off continuous sedation, receiving PRN Fentanyl for wound care Afebrile (Tmax 98.9/24H), WBC 9.0 (12.7) Remains on full vent support (28/18, FiO2 70%), improved vent synchrony today Plan for bronch and tracheostomy today Continues on Unasyn Slowly improving hyperNa with FWF + D5  Objective   Blood pressure (!) 139/55, pulse (!) 103, temperature 98.4 F (36.9 C), temperature source Esophageal, resp. rate 15, height _0  (1.778 m), weight (!) 277.1 kg, SpO2 100 %.    Vent Mode: PCV FiO2 (%):  [75 %-80 %] 75 % Set Rate:  [18 bmp] 18 bmp PEEP:  [18 cmH20] 18 cmH20 Plateau Pressure:  [30 cmH20-39 cmH20] 30 cmH20   Intake/Output Summary (Last 24 hours) at 06/26/2020 02620Last data filed at 06/26/2020 0700 Gross per 24 hour  Intake 4396.68 ml  Output 3650 ml  Net 746.68 ml   Filed Weights   06/23/20 0500 06/24/20 0319 06/25/20 0500  Weight: (!) 276.7 kg (!) 274 kg (!) 277.1 kg   Physical Exam: General: Morbidly obese adult male, sedate, in NAD. HEENT: Anicteric sclera, dry mucous membranes, ETT present. Neuro: Sedate despite being  off all sedation, minimally responsive. Withdraws only intermittently to painful stimuli, occasionally opens eyes to name. Weak gag/cough. CV: S1S2, RRR, no m/g/r. PULM: Breathing even and minimally labored on vent  (28/18, FiO2 70%), scattered rhonchi (improved from previous exam), diminished breath sounds bilaterally. GI: Obese, soft, nontender, nondistended. Normoactive BS. Extremities: Symmetric BLE edema, 2+ pitting. Skin: Warm/dry, intertriginous erythema + Interdry, venous stasis changes to BLE. Please see CCS note/Media tab for buttock wound photos.  Resolved problems:  VT arrest 1/10, Ileus 1/15, Elevated LFTs from shock, Metabolic acidosis with lactic acidosis Septic shock  Assessment & Plan:   Necrotizing fasciitis of Lt buttocks s/p debridement HCAP Persistent fever - Appreciate ID recs - S/p PCN challenge 1/24 and transition to Unasyn - Per Pharmacy, consider transition to Augmentin in the setting of hyperNa - Surgery following, no indication for repeat debridement at present - Wound care per CCS  Acute on chronic hypoxic/hypercapnic respiratory failure in setting of sepsis Volume overload Noted to have mucus plugging and dynamic airway collapse during bronchoscopy on 1/19. Spontaneous episode of desaturation 1/24AM with increased vent requirements. Ongoing high vent requirements likely d/t body habitus and previous mucous plugging. - Continue full vent support, minimum PEEP 12 with body habitus - Position patient as able to maximize chest wall compliance - Plan for bronch/tracheostomy today, 1/26 (mother at bedside, aware) - Continue to assess volume status (net +3L/24H, net -1.2L/admission)  - Lasix 56m IV BID today  Hypotension Likely multifactorial in the setting of sepsis/sedation - Continue Levophed - Wean as able for MAP > 65  Hypernatremia - Ongoing hyperNa to 150s, slowly improving - Continue FWF 406mQ4H - Continue D5 - Per Pharmacy, consider transition from Unasyn -> Augmentin, as Unasyn has high Na content  AFib with RVR HLD - Continue Amiodarone, Lipitor - Continue heparin gtt (HELD for tracheostomy) - Trend H&H on heparin - Replete electrolytes as  indicated  DM type 2 poorly controlled with hyperglycemia - Basal Levemir BID - Standing Novolog Q4H for TF coverage - SSI  Hypothyroidism - Continue Synthroid  Acute metabolic encephalopathy 2nd to sepsis. 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 - PAD protocol for RASS -1 to -2 - Fentanyl gtt off since 1/25AM, remains sedate - Consider holding Lyrica, nortriptyline if remaining minimally responsive  Anemia of critical illness - Trend CBC on heparin gtt - Transfuse for Hgb < 7.0  Best practice (evaluated daily)  Diet: Continue TF DVT prophylaxis: Heparin gtt  GI prophylaxis: Protonix  Mobility: Bedrest Disposition: ICU Family: Updated pt's mother at bedside 1/26 Code status: Full Code  Labs    CMP Latest Ref Rng & Units 06/26/2020 06/25/2020 06/24/2020  Glucose 70 - 99 mg/dL 171(H) 134(H) 222(H)  BUN 6 - 20 mg/dL 63(H) 53(H) 39(H)  Creatinine 0.61 - 1.24 mg/dL 2.20(H) 1.71(H) 1.11  Sodium 135 - 145 mmol/L 154(H) 155(H) 157(H)  Potassium 3.5 - 5.1 mmol/L 4.0 4.0 4.1  Chloride 98 - 111 mmol/L 112(H) 112(H) 115(H)  CO2 22 - 32 mmol/L 31 32 33(H)  Calcium 8.9 - 10.3 mg/dL 7.9(L) 8.3(L) 8.0(L)  Total Protein 6.5 - 8.1 g/dL 5.8(L) - -  Total Bilirubin 0.3 - 1.2 mg/dL 0.7 - -  Alkaline Phos 38 - 126 U/L 44 - -  AST 15 - 41 U/L 27 - -  ALT 0 - 44 U/L 25 - -    CBC Latest Ref Rng & Units 06/26/2020 06/25/2020 06/24/2020  WBC 4.0 - 10.5 K/uL 9.0 12.7(H) 10.4  Hemoglobin 13.0 -  17.0 g/dL 8.7(L) 9.0(L) 8.4(L)  Hematocrit 39.0 - 52.0 % 31.2(L) 34.3(L) 30.6(L)  Platelets 150 - 400 K/uL 186 232 219   ABG    Component Value Date/Time   PHART 7.401 06/23/2020 1204   PCO2ART 57.4 (H) 06/23/2020 1204   PO2ART 73 (L) 06/23/2020 1204   HCO3 35.5 (H) 06/23/2020 1204   TCO2 37 (H) 06/23/2020 1204   ACIDBASEDEF 2.0 06/13/2020 1221   O2SAT 94.0 06/23/2020 1204    CBG (last 3)  Recent Labs    06/25/20 1953 06/25/20 2332 06/26/20 0333  GLUCAP 140* 156* 145*   Critical care time: 32  minutes   Lestine Mount, PA-C  Pulmonary & Critical Care 06/26/20 8:23 AM  Please see [email or web address] for pager details.   PCCM:  50 yo obese M, nec fasc, septic shock, remains on full vent support, CXR this AM reviewed with left mucus plugging.   BP (!) 139/55   Pulse (!) 103   Temp 98.9 F (37.2 C) (Esophageal)   Resp 18   Ht _0  (1.778 m)   Wt (!) 277.1 kg   SpO2 96%   BMI 87.67 kg/m   Gen: Obese male, intubated on vent  HENT: tracking, responds to commands  Heart: RRR, s1 s2 Lungs: BL vented breaths   Labs: Reviewed  Assessment: Necrotizing fasciitis, septic shock Acute metabolic encephalopathy Worsening renal function, AKI, acute renal failure Acute hypoxemic respiratory failure requiring intubation mechanical ventilation Severe morbid obesity, likely has obstructive sleep apnea. Severe left-sided mucous plugging with lobar collapse.  Plan: Discussed goals of care with patient's mother today.  Please see separate documentation Plan for tracheostomy tube placement. Mucous plugging status post bronchoscopy, cultures pending Wean from sedation as tolerated post tracheostomy Conservative transfusion threshold Continue tube feeds Hold heparin for procedure. Post procedure can restart 2 hours post.  This patient is critically ill with multiple organ system failure; which, requires frequent high complexity decision making, assessment, support, evaluation, and titration of therapies. This was completed through the application of advanced monitoring technologies and extensive interpretation of multiple databases. During this encounter critical care time was devoted to patient care services described in this note for 32 minutes.  [place] [place] [facility] 06/26/2020 12:54 PM

## 2020-06-26 NOTE — Procedures (Signed)
Percutaneous Tracheostomy Procedure Note  Carl Hill  [id number]  19-Sep-1970  Date:06/26/20  Time:4:55 PM   Provider Performing:Cael Worth L Tsuruko Murtha  Procedure: Percutaneous Tracheostomy with Bronchoscopic Guidance (30076)  Indication(s) Acute on chronic respiratory failure   Consent Risks of the procedure as well as the alternatives and risks of each were explained to the patient and/or caregiver.  Consent for the procedure was obtained.  Anesthesia Etomidate, Versed, Fentanyl, Vecuronium  Time Out Verified patient identification, verified procedure, site/side was marked, verified correct patient position, special equipment/implants available, medications/allergies/relevant history reviewed, required imaging and test results available.  Sterile Technique Maximal sterile technique including sterile barrier drape, hand hygiene, sterile gown, sterile gloves, mask, hair covering.  Procedure Description Appropriate anatomy identified by palpation.  Patient's neck prepped and draped in sterile fashion.  1% lidocaine with epinephrine was used to anesthetize skin overlying neck.  1.5cm incision made and blunt dissection performed until tracheal rings could be easily palpated.   We attempted to place #6 distal XLT however the distal rim would not allow passage into the airway and would get hung on the above tracheal ring. We then switched to a standard #6. Then a size #6 Shiley tracheostomy was placed under bronchoscopic visualization using usual Seldinger technique and serial dilation.   Bronchoscope confirmed placement above the carina.  Tracheostomy was sutured in place with adhesive pad to protect skin under pressure. Patient connected to ventilator.  Complications/Tolerance ETT was dislodge during procedure. Glidescope was use for reinsertion of ETT. Saturations recovery quickly and procedure was completed. Chest X-ray is ordered to confirm no post-procedural complication.  EBL <3cc    Specimen(s) None

## 2020-06-27 DIAGNOSIS — N493 Fournier gangrene: Secondary | ICD-10-CM | POA: Diagnosis not present

## 2020-06-27 DIAGNOSIS — N179 Acute kidney failure, unspecified: Secondary | ICD-10-CM | POA: Diagnosis not present

## 2020-06-27 DIAGNOSIS — A429 Actinomycosis, unspecified: Secondary | ICD-10-CM | POA: Diagnosis not present

## 2020-06-27 LAB — TRIGLYCERIDES: Triglycerides: 122 mg/dL (ref <150)

## 2020-06-27 LAB — BASIC METABOLIC PANEL
Anion gap: 11 (ref 5–15)
BUN: 65 mg/dL — ABNORMAL HIGH (ref 6–20)
CO2: 34 mmol/L — ABNORMAL HIGH (ref 22–32)
Calcium: 8 mg/dL — ABNORMAL LOW (ref 8.9–10.3)
Chloride: 111 mmol/L (ref 98–111)
Creatinine, Ser: 2.23 mg/dL — ABNORMAL HIGH (ref 0.61–1.24)
GFR, Estimated: 35 mL/min — ABNORMAL LOW (ref >60)
Glucose, Bld: 126 mg/dL — ABNORMAL HIGH (ref 70–99)
Potassium: 3.3 mmol/L — ABNORMAL LOW (ref 3.5–5.1)
Sodium: 156 mmol/L — ABNORMAL HIGH (ref 135–145)

## 2020-06-27 LAB — CBC
HCT: 27.1 % — ABNORMAL LOW (ref 39.0–52.0)
Hemoglobin: 7.5 g/dL — ABNORMAL LOW (ref 13.0–17.0)
MCH: 27.5 pg (ref 26.0–34.0)
MCHC: 27.7 g/dL — ABNORMAL LOW (ref 30.0–36.0)
MCV: 99.3 fL (ref 80.0–100.0)
Platelets: 179 10*3/uL (ref 150–400)
RBC: 2.73 MIL/uL — ABNORMAL LOW (ref 4.22–5.81)
RDW: 17.4 % — ABNORMAL HIGH (ref 11.5–15.5)
WBC: 9 10*3/uL (ref 4.0–10.5)
nRBC: 0.7 % — ABNORMAL HIGH (ref 0.0–0.2)

## 2020-06-27 LAB — GLUCOSE, CAPILLARY
Glucose-Capillary: 114 mg/dL — ABNORMAL HIGH (ref 70–99)
Glucose-Capillary: 112 mg/dL — ABNORMAL HIGH (ref 70–99)
Glucose-Capillary: 136 mg/dL — ABNORMAL HIGH (ref 70–99)
Glucose-Capillary: 88 mg/dL (ref 70–99)
Glucose-Capillary: 118 mg/dL — ABNORMAL HIGH (ref 70–99)
Glucose-Capillary: 158 mg/dL — ABNORMAL HIGH (ref 70–99)

## 2020-06-27 LAB — HEPARIN LEVEL (UNFRACTIONATED)
Heparin Unfractionated: 0.28 IU/mL — ABNORMAL LOW (ref 0.30–0.70)
Heparin Unfractionated: 0.16 IU/mL — ABNORMAL LOW (ref 0.30–0.70)

## 2020-06-27 MED ORDER — AMOXICILLIN-POT CLAVULANATE 875-125 MG PO TABS
1.0000 | ORAL_TABLET | Freq: Three times a day (TID) | ORAL | Status: DC
  Administered 2020-06-27 – 2020-07-12 (×44): 1
  Filled 2020-06-27 (×48): qty 1

## 2020-06-27 MED ORDER — HEPARIN (PORCINE) 25000 UT/250ML-% IV SOLN
2150.0000 [IU]/h | INTRAVENOUS | Status: DC
  Administered 2020-06-27 – 2020-06-30 (×5): 2200 [IU]/h via INTRAVENOUS
  Filled 2020-06-27 (×8): qty 250

## 2020-06-27 MED ORDER — POTASSIUM CHLORIDE 10 MEQ/50ML IV SOLN
10.0000 meq | INTRAVENOUS | Status: AC
  Administered 2020-06-27 (×2): 10 meq via INTRAVENOUS
  Filled 2020-06-27 (×2): qty 50

## 2020-06-27 MED ORDER — METOLAZONE 2.5 MG PO TABS
5.0000 mg | ORAL_TABLET | Freq: Once | ORAL | Status: AC
  Administered 2020-06-27: 5 mg via ORAL
  Filled 2020-06-27: qty 2

## 2020-06-27 MED ORDER — FUROSEMIDE 10 MG/ML IJ SOLN
40.0000 mg | Freq: Once | INTRAMUSCULAR | Status: AC
  Administered 2020-06-27: 40 mg via INTRAVENOUS
  Filled 2020-06-27: qty 4

## 2020-06-27 MED ORDER — POTASSIUM CHLORIDE 20 MEQ PO PACK
20.0000 meq | PACK | ORAL | Status: AC
  Administered 2020-06-27 (×2): 20 meq
  Filled 2020-06-27 (×2): qty 1

## 2020-06-27 MED ORDER — HEPARIN (PORCINE) 25000 UT/250ML-% IV SOLN
2150.0000 [IU]/h | INTRAVENOUS | Status: DC
  Administered 2020-06-27 – 2020-06-30 (×7): 2200 [IU]/h via INTRAVENOUS
  Filled 2020-06-27 (×7): qty 250

## 2020-06-27 NOTE — Progress Notes (Signed)
ANTICOAGULATION CONSULT NOTE  Pharmacy Consult for Heparin Indication: atrial fibrillation  Patient Measurements: Height: 5\' 10"  (177.8 cm) Weight: (!) 277.1 kg (611 lb) IBW/kg (Calculated) : 73 Heparin Dosing Weight: 149.7 kg  Vital Signs: Temp: 98.4 F (36.9 C) (01/27 2003) Temp Source: Esophageal (01/27 2003)  Labs: Recent Labs    06/25/20 0551 06/26/20 0219 06/26/20 0220 06/27/20 0128 06/27/20 0129 06/27/20 0130 06/27/20 1900  HGB 9.0* 8.7*  --   --   --  7.5*  --   HCT 34.3* 31.2*  --   --   --  27.1*  --   PLT 232 186  --   --   --  179  --   HEPARINUNFRC 0.41  --  0.41  --  0.28*  --  0.16*  CREATININE 1.71* 2.20*  --  2.23*  --   --   --     Estimated Creatinine Clearance: 88.6 mL/min (A) (by C-G formula based on SCr of 2.23 mg/dL (H)).   Assessment: 49YOM admitted with septic shock secondary to necrotizing fascitis, on IV heparin for Afib.  Heparin level is sub-therapeutic at 0.16 units/mL and trended down.  RN checked IV site and pump; no issue with infusion.  No bleeding reported.  Goal of Therapy:  Heparin level 0.3-0.7 units/ml Monitor platelets by anticoagulation protocol: Yes   Plan:  Increase heparin infusion to 4400 units/hr F/U AM labs  Daysean Tinkham D. 06/29/20, PharmD, BCPS, BCCCP 06/27/2020, 8:35 PM

## 2020-06-27 NOTE — Progress Notes (Addendum)
ANTICOAGULATION CONSULT NOTE - Follow Up Consult  Pharmacy Consult for Heparin Indication: atrial fibrillation  Patient Measurements: Height: 5\' 10"  (177.8 cm) Weight: (!) 277.1 kg (611 lb) IBW/kg (Calculated) : 73 Heparin Dosing Weight: 149.7 kg  Vital Signs: Temp: 98.3 F (36.8 C) (01/27 0834) Temp Source: Rectal (01/27 0834) BP: 127/53 (01/27 0315) Pulse Rate: 93 (01/27 0315)  Labs: Recent Labs    06/25/20 0551 06/26/20 0219 06/26/20 0220 06/27/20 0128 06/27/20 0129 06/27/20 0130  HGB 9.0* 8.7*  --   --   --  7.5*  HCT 34.3* 31.2*  --   --   --  27.1*  PLT 232 186  --   --   --  179  HEPARINUNFRC 0.41  --  0.41  --  0.28*  --   CREATININE 1.71* 2.20*  --  2.23*  --   --     Estimated Creatinine Clearance: 88.6 mL/min (A) (by C-G formula based on SCr of 2.23 mg/dL (H)).   Assessment: 49YOM admitted with septic shock secondary to necrotizing fascitis, on heparin for afib. Subherapeutic heparin level (0.28), Infusion rate was not increased overnight due to concern that heparin was paused for trach procedure. Heparin was resumed 1/26 @1940 , and level drawn at 1/27 @0130 , about 6 hours after heparin was restarted.  CBC downtrending, PLT wnl. Per RN no issues with bleeding or infusion.  Goal of Therapy:  Heparin level 0.3-0.7 units/ml Monitor platelets by anticoagulation protocol: Yes   Plan:  - Increase Heparin to 3950 units/hr - repeat heparin level in 6-8 hours - Will continue to monitor for any signs/symptoms of bleeding - Daily HL, CBC  Thank you for allowing pharmacy to be a part of this patient's care.  , PharmD PGY1 Pharmacy Resident 06/27/2020 9:41 AM

## 2020-06-27 NOTE — Progress Notes (Signed)
eLink Physician-Brief Progress Note Patient Name: Carl Hill DOB: 16-Jan-1971 MRN: [id number]   Date of Service  06/27/2020  HPI/Events of Note  K+ 3.3, GFR 35  eICU Interventions  Adult electrolyte replacement protocol (modified for GFR of 35) ordered.        Thomasene Lot Ogan 06/27/2020, 3:37 AM

## 2020-06-27 NOTE — Progress Notes (Addendum)
NAME:  Carl Hill, MRN:  [id number], DOB:  Jan 11, 1971, LOS: 20 ADMISSION DATE:  06/10/2020, CONSULTATION DATE:  06/10/2020 REFERRING MD:  Lorin Mercy, CHIEF COMPLAINT:  Severe Sepsis    Brief History:  50 year old male who presented with buttock pain, fatigue and malaise.  Found to have hyperglycemia. CT pelvis demonstrated necrotizing fasciitis in L buttock, L upper thigh, and perineum. Taken to OR for debridement 1/10 c/b Afib with RVR and VT arrest s/p CPR/defib. Trached 1/26, remains on full vent support.  Past Medical History:  HTN, obesity, DM, AFib on DOAC  Significant Hospital Events:  1/10  Admitted to [facility]. General surgery consulted and debridement pursued. In the afternoon, patient developed hypotension with AF in RVR. Synchronized cardioversion unsuccessful with conversion to VT. CPR initated with ROSC after desynchronized defibrillation. 1/11 Debridement 1/18 Mucus plugging 1/19 Bronchoscopy >> mucus plug on Rt, airway collapse with exhalation 1/20 Persistent fever, change ABx 1/24 Increased vent requirements, habitus related +/- mucus plugging 1/25 Hypotensive to SBP 80s, Levo resumed, minimally responsive overnight off sedation, CT Head negative 1/26 Stable vent/pressor requirements, bronched, tracheostomy  Consults:  General Surgery, ID  Procedures:  ETT 1/10 >>  R IJ CVL 1/10 >>  L brachial A-line 1/10 >>   Significant Diagnostic Tests:   CT abd/pelvis 1/10 >> Extensive subcutaneous emphysema in the medial aspect of the upper thighs extending through perineum and superiorly to the medial left buttock. Gas extends into the pelvis surrounding the anus inferiorly and tracking superiorly along the left wall of the rectum.  CT C/A/P 1/22 >> Imaging limited due to body habitus. No gross fluid collection or gas seen. Bilateral consolidation with dependent atelectasis  CT Head 1/25 >> No acute intracranial abnormality  Micro Data:  COVID/Flu 1/10 >> negative MRSA PCR  1/10 >> negative L buttocks wound 1/10 >> E. Coli (pansensitive), Streptococcus infantarious (sensitive to PCN, ceftriaxone, vanc), Peptostreptococcus species, rare GBS Blood 1/10 >> negative BAL 1/20 >> normal flora  Antimicrobials:  Aztreonam 1/09 Metronidazole 1/09 Vancomycin 1/09, 1/11, 1/20 >>1/22 Cefepime 1/10 >> 1/13, 1/20 >>1/22 Clindamycin 1/10 >> 1/13 Ceftriaxone 1/13 >> 1/20 Metronidazole 1/17 >> 1/22 Meropenem 1/22 >> 1/25 Amoxicillin 1/24 (x 1 for PCN Allergy challenge) Unasyn 1/25 >> 1/27 Augmentin 1/27 >>  Interim History / Subjective:  Improved mental status/less sedate this AM Following commands, profoundly weak S/p trach and bronch x 3 yesterday Remains on full vent support, slightly less requirement (28/16, FiO2 65%) Better vent synchrony Afebrile (Tmax 98.8/24H), WBC 9.0 Hgb drift 7.5 (8.7), no signs of active bleeding Robust UOP with Lasix Net -5L/24H, -9.6L/admission  Objective   Blood pressure (!) 127/53, pulse 93, temperature 98.3 F (36.8 C), temperature source Rectal, resp. rate 19, height _0  (1.778 m), weight (!) 277.1 kg, SpO2 100 %.    Vent Mode: PCV FiO2 (%):  [60 %-100 %] 65 % Set Rate:  [18 bmp] 18 bmp PEEP:  [14 cmH20-18 cmH20] 14 cmH20 Plateau Pressure:  [28 cmH20-31 cmH20] 28 cmH20   Intake/Output Summary (Last 24 hours) at 06/27/2020 0914 Last data filed at 06/27/2020 0700 Gross per 24 hour  Intake 3753.72 ml  Output 8650 ml  Net -4896.28 ml   Filed Weights   06/23/20 0500 06/24/20 0319 06/25/20 0500  Weight: (!) 276.7 kg (!) 274 kg (!) 277.1 kg    Physical Exam: General: Morbidly obese adult male, lying in bed, in NAD. HEENT: Anicteric sclera, dry mucus membranes. S/p tracheostomy. Neuro: Remains mildly sedated, more responsive  today. Following commands, able to move hands/feet though profoundly weak.  CV: S1S2, RRR, no m/g/r. PULM: Breathing even and unlabored on vent (28/16, FiO2 65%), scattered rhonchi L > R, diminished  breath sounds bilaterally.  GI: Obese, soft, nontender, nondistended. Normoactive BS. Extremities: Symmetric BLE edema, 2+ pitting. Skin: Warm/dry, intertriginous erythema + Interdry, venous stasis changes to BLE. Please see CCS note/Media tab for buttock wound photos.   Resolved problems:  VT arrest 1/10, Ileus 1/15, Elevated LFTs from shock, Metabolic acidosis with lactic acidosis Septic shock  Assessment & Plan:   Necrotizing fasciitis of Lt buttocks s/p debridement HCAP Persistent fever - Appreciate ID recs - S/p PCN challenge 1/24 - Unasyn transitioned to Augmentin 1/27 in the setting of hyperNa - Surgery following, no indication for repeat debridement at present - Wound care per CCS  Acute on chronic hypoxic/hypercapnic respiratory failure in setting of sepsis Volume overload Noted to have mucus plugging and dynamic airway collapse during bronchoscopy on 1/19. Spontaneous episode of desaturation 1/24AM with increased vent requirements. Ongoing high vent requirements likely d/t body habitus and previous mucous plugging. - S/p difficult trach 1/26 - Continue full vent support, min PEEP 12 with body habitus, wean as able - Position patient as able to maximize chest wall compliance - Continue to assess volume status - Lasix 71m IV x 1 today - Supp K as indicated  Hypotension Likely multifactorial in the setting of sepsis/sedation - Continue Levophed, weaning for goal MAP > 65  Hypernatremia - Ongoing hyperNa to 150s, slowly improving - Continue FWF Q4H - Continue D5 - Transition to Augmentin today, given high Na content of Unasyn  AFib with RVR HLD - Continue amiodarone, lipitor - Continue heparin gtt - Trend H&H on heparin - Replete lytes for K > 4, Mg  > 2  DM type 2 poorly controlled with hyperglycemia - Basal Levemir BID - Standing Novolog Q4H for TFs - SSI  Hypothyroidism - Continue Synthroid  Acute metabolic encephalopathy 2nd to sepsis. Chronic pain  from DM neuropathy - PAD protocol for RASS -1 to -2 - Continuous sedation off - Continue Lyrica, nortriptyline  Anemia of critical illness -Trend CBC on heparin gtt - Transfuse for Hgb < 7.0  Best practice (evaluated daily)  Diet: Continue TF DVT prophylaxis: Heparin gtt  GI prophylaxis: Protonix  Mobility: Bedrest Disposition: ICU Family: Updated pt's mother at bedside 1/27 Code status: Full Code  Labs    CMP Latest Ref Rng & Units 06/27/2020 06/26/2020 06/25/2020  Glucose 70 - 99 mg/dL 126(H) 171(H) 134(H)  BUN 6 - 20 mg/dL 65(H) 63(H) 53(H)  Creatinine 0.61 - 1.24 mg/dL 2.23(H) 2.20(H) 1.71(H)  Sodium 135 - 145 mmol/L 156(H) 154(H) 155(H)  Potassium 3.5 - 5.1 mmol/L 3.3(L) 4.0 4.0  Chloride 98 - 111 mmol/L 111 112(H) 112(H)  CO2 22 - 32 mmol/L 34(H) 31 32  Calcium 8.9 - 10.3 mg/dL 8.0(L) 7.9(L) 8.3(L)  Total Protein 6.5 - 8.1 g/dL - 5.8(L) -  Total Bilirubin 0.3 - 1.2 mg/dL - 0.7 -  Alkaline Phos 38 - 126 U/L - 44 -  AST 15 - 41 U/L - 27 -  ALT 0 - 44 U/L - 25 -    CBC Latest Ref Rng & Units 06/27/2020 06/26/2020 06/25/2020  WBC 4.0 - 10.5 K/uL 9.0 9.0 12.7(H)  Hemoglobin 13.0 - 17.0 g/dL 7.5(L) 8.7(L) 9.0(L)  Hematocrit 39.0 - 52.0 % 27.1(L) 31.2(L) 34.3(L)  Platelets 150 - 400 K/uL 179 186 232   ABG  Component Value Date/Time   PHART 7.401 06/23/2020 1204   PCO2ART 57.4 (H) 06/23/2020 1204   PO2ART 73 (L) 06/23/2020 1204   HCO3 35.5 (H) 06/23/2020 1204   TCO2 37 (H) 06/23/2020 1204   ACIDBASEDEF 2.0 06/13/2020 1221   O2SAT 94.0 06/23/2020 1204    CBG (last 3)  Recent Labs    06/26/20 2333 06/27/20 0337 06/27/20 0758  GLUCAP 123* 114* 112*   Critical care time: 30 minutes   Lestine Mount, PA-C [facility] 06/27/20 9:13 AM  Please see [email or web address] for pager details.    PCCM:   50 yo, M, obese nec fasc, intubated for septic shock, critically ill, and was trached yesterday.   BP (!) 127/53   Pulse 93   Temp 99 F (37.2  C) (Rectal)   Resp 20   Ht _0  (1.778 m)   Wt (!) 277.1 kg   SpO2 100%   BMI 87.67 kg/m   Gen: morbidly obese M HENT: Trach in place Abd: Soft, obese, NT  Heart: RRR, s1 s2 Lungs: BL vented breaths, distant   Labs - reviewed   A:  Necrotizing fascitis HCAP  Acute on chronic hypoxemic/hypercarbic respiratory failure s/p trach  Severe morbidi obesity  Mucus plugging, improved   P: Decreasing sedation needs PS trials once ready  Conservative transfusion threshold TFs  Continue heparin   This patient is critically ill with multiple organ system failure; which, requires frequent high complexity decision making, assessment, support, evaluation, and titration of therapies. This was completed through the application of advanced monitoring technologies and extensive interpretation of multiple databases. During this encounter critical care time was devoted to patient care services described in this note for 32 minutes.  [facility] 06/27/2020 3:00 PM

## 2020-06-27 NOTE — Progress Notes (Signed)
Nutrition Follow-up   DOCUMENTATION CODES:   Morbid obesity  INTERVENTION:   Continue TF via Cortrak: Pivot 1.5 at 70 ml/h (1680 ml per day) Prosource TF 90 ml QID  Provides 2840 kcal, 246 gm protein, 1260 ml free water daily.  NUTRITION DIAGNOSIS:   Increased nutrient needs related to wound healing as evidenced by estimated needs.  Ongoing  GOAL:   Patient will meet greater than or equal to 90% of their needs   Met with TF  MONITOR:   Vent status,Labs,TF tolerance,Skin  REASON FOR ASSESSMENT:   Consult Enteral/tube feeding initiation and management  ASSESSMENT:   50 yo male admitted with hyperglycemia, severe septic shock r/t Fournier's gangrene, AKI. PMH includes morbid obesity, HTN, DM, chronic LE wounds (L heel, L calf) A fib, laparoscopic gastric sleeve resection.   Discussed patient in ICU rounds and with RN today. S/P tracheostomy 1/26.  Currently, Pivot 1.5 is infusing via Cortrak tube (tip is gastric) at goal rate of 70 ml/h. Receiving free water flushes 400 ml every 4 hours. Receiving Prosource TF 90 ml QID.  Patient is receiving daily saline wet-to-dry dressing changes to wound. Rectal tube in place to minimize wound contamination with stool. Patient is not a candidate for a colostomy due to body habitus.   Patient remains intubated on ventilator support. MV: 13.4 L/min Temp (24hrs), Avg:98.8 F (37.1 C), Min:98.3 F (36.8 C), Max:99.1 F (37.3 C) MAP (a-line) >/= 67 this morning   Labs reviewed. Sodium 156, K 3.3, BUN 65, Creat 2.23 CBG: [phone]  Medications reviewed and include colace, Novolog, Levemir, Miralax, Levophed. IVF: D5 NS at 40 ml/h  Weight 277.1 kg 1/25 I/O +3.3 L since admission Rectal tube 250 ml output x 24 h  Diet Order:   Diet Order            Diet NPO time specified  Diet effective midnight                 EDUCATION NEEDS:   Not appropriate for education at this time  Skin:  Skin Assessment: Skin  Integrity Issues: Skin Integrity Issues:: Stage II,Other (Comment) Stage II: R heel Other: necrotizing infection to pelvis, anus, rectum  Last BM:  1/27 rectal tube  Height:   Ht Readings from Last 1 Encounters:  06/14/20 '5\' 10"'  (1.778 m)    Weight:   Wt Readings from Last 1 Encounters:  06/25/20 (!) 277.1 kg    Ideal Body Weight:  94.5 kg  BMI:  Body mass index is 87.67 kg/m.  Estimated Nutritional Needs:   Kcal:  2500-2800  Protein:  225-240 gm  Fluid:  >/= 2.5 L    Lucas Mallow, RD, LDN, CNSC Please refer to Amion for contact information.

## 2020-06-28 ENCOUNTER — Inpatient Hospital Stay (HOSPITAL_COMMUNITY): Payer: Medicaid Other

## 2020-06-28 DIAGNOSIS — J9809 Other diseases of bronchus, not elsewhere classified: Secondary | ICD-10-CM

## 2020-06-28 DIAGNOSIS — J9601 Acute respiratory failure with hypoxia: Secondary | ICD-10-CM | POA: Diagnosis not present

## 2020-06-28 DIAGNOSIS — M726 Necrotizing fasciitis: Secondary | ICD-10-CM | POA: Diagnosis not present

## 2020-06-28 DIAGNOSIS — J189 Pneumonia, unspecified organism: Secondary | ICD-10-CM | POA: Diagnosis not present

## 2020-06-28 LAB — CULTURE, RESPIRATORY W GRAM STAIN: Culture: NORMAL

## 2020-06-28 LAB — BASIC METABOLIC PANEL
Anion gap: 13 (ref 5–15)
BUN: 63 mg/dL — ABNORMAL HIGH (ref 6–20)
CO2: 34 mmol/L — ABNORMAL HIGH (ref 22–32)
Calcium: 8 mg/dL — ABNORMAL LOW (ref 8.9–10.3)
Chloride: 107 mmol/L (ref 98–111)
Creatinine, Ser: 1.93 mg/dL — ABNORMAL HIGH (ref 0.61–1.24)
GFR, Estimated: 42 mL/min — ABNORMAL LOW (ref >60)
Glucose, Bld: 142 mg/dL — ABNORMAL HIGH (ref 70–99)
Potassium: 3.4 mmol/L — ABNORMAL LOW (ref 3.5–5.1)
Sodium: 154 mmol/L — ABNORMAL HIGH (ref 135–145)

## 2020-06-28 LAB — CBC
HCT: 25.4 % — ABNORMAL LOW (ref 39.0–52.0)
Hemoglobin: 7.3 g/dL — ABNORMAL LOW (ref 13.0–17.0)
MCH: 27.8 pg (ref 26.0–34.0)
MCHC: 28.7 g/dL — ABNORMAL LOW (ref 30.0–36.0)
MCV: 96.6 fL (ref 80.0–100.0)
Platelets: 168 10*3/uL (ref 150–400)
RBC: 2.63 MIL/uL — ABNORMAL LOW (ref 4.22–5.81)
RDW: 17.5 % — ABNORMAL HIGH (ref 11.5–15.5)
WBC: 8.2 10*3/uL (ref 4.0–10.5)
nRBC: 0.5 % — ABNORMAL HIGH (ref 0.0–0.2)

## 2020-06-28 LAB — GLUCOSE, CAPILLARY
Glucose-Capillary: 107 mg/dL — ABNORMAL HIGH (ref 70–99)
Glucose-Capillary: 118 mg/dL — ABNORMAL HIGH (ref 70–99)
Glucose-Capillary: 129 mg/dL — ABNORMAL HIGH (ref 70–99)
Glucose-Capillary: 133 mg/dL — ABNORMAL HIGH (ref 70–99)
Glucose-Capillary: 135 mg/dL — ABNORMAL HIGH (ref 70–99)
Glucose-Capillary: 154 mg/dL — ABNORMAL HIGH (ref 70–99)

## 2020-06-28 LAB — HEPARIN LEVEL (UNFRACTIONATED)
Heparin Unfractionated: 0.48 IU/mL (ref 0.30–0.70)
Heparin Unfractionated: 0.66 IU/mL (ref 0.30–0.70)
Heparin Unfractionated: 0.61 IU/mL (ref 0.30–0.70)

## 2020-06-28 LAB — TRIGLYCERIDES: Triglycerides: 104 mg/dL (ref <150)

## 2020-06-28 MED ORDER — POTASSIUM CHLORIDE 20 MEQ PO PACK
40.0000 meq | PACK | ORAL | Status: AC
  Administered 2020-06-28 (×2): 40 meq
  Filled 2020-06-28 (×2): qty 2

## 2020-06-28 MED ORDER — FUROSEMIDE 10 MG/ML IJ SOLN
40.0000 mg | Freq: Once | INTRAMUSCULAR | Status: AC
  Administered 2020-06-28: 40 mg via INTRAVENOUS
  Filled 2020-06-28: qty 4

## 2020-06-28 MED ORDER — POTASSIUM CHLORIDE 20 MEQ PO PACK
40.0000 meq | PACK | Freq: Once | ORAL | Status: AC
  Administered 2020-06-28: 40 meq
  Filled 2020-06-28: qty 2

## 2020-06-28 NOTE — Progress Notes (Signed)
NAME:  Carl Hill, MRN:  [id number], DOB:  [birth year], LOS: 2 ADMISSION DATE:  06/10/2020, CONSULTATION DATE:  06/10/2020 REFERRING MD:  Lorin Mercy, CHIEF COMPLAINT:  Severe Sepsis    Brief History:  50 year old male who presented with buttock pain, fatigue and malaise.  Found to have hyperglycemia. CT pelvis demonstrated necrotizing fasciitis in L buttock, L upper thigh, and perineum. Taken to OR for debridement 1/10 c/b Afib with RVR and VT arrest s/p CPR/defib. Trached 1/26, remains on full vent support.  Past Medical History:  HTN, obesity, DM, AFib on DOAC  Significant Hospital Events:  1/10  Admitted to [facility]. General surgery consulted and debridement pursued. In the afternoon, patient developed hypotension with AF in RVR. Synchronized cardioversion unsuccessful with conversion to VT. CPR initated with ROSC after desynchronized defibrillation. 1/11 Debridement 1/18 Mucus plugging 1/19 Bronchoscopy >> mucus plug on Rt, airway collapse with exhalation 1/20 Persistent fever, change ABx 1/24 Increased vent requirements, habitus related +/- mucus plugging 1/25 Hypotensive to SBP 80s, Levo resumed, minimally responsive overnight off sedation, CT Head negative 1/26 Stable vent/pressor requirements, bronched, tracheostomy  Consults:  General Surgery, ID  Procedures:  ETT 1/10 >>  R IJ CVL 1/10 >>  L brachial A-line 1/10 >>   Significant Diagnostic Tests:   CT abd/pelvis 1/10 >> Extensive subcutaneous emphysema in the medial aspect of the upper thighs extending through perineum and superiorly to the medial left buttock. Gas extends into the pelvis surrounding the anus inferiorly and tracking superiorly along the left wall of the rectum.  CT C/A/P 1/22 >> Imaging limited due to body habitus. No gross fluid collection or gas seen. Bilateral consolidation with dependent atelectasis  CT Head 1/25 >> No acute intracranial abnormality  Micro Data:  COVID/Flu 1/10 >> negative MRSA PCR  1/10 >> negative L buttocks wound 1/10 >> E. Coli (pansensitive), Streptococcus infantarious (sensitive to PCN, ceftriaxone, vanc), Peptostreptococcus species, rare GBS Blood 1/10 >> negative BAL 1/20 >> normal flora  Antimicrobials:  Aztreonam 1/09 Metronidazole 1/09 Vancomycin 1/09, 1/11, 1/20 >>1/22 Cefepime 1/10 >> 1/13, 1/20 >>1/22 Clindamycin 1/10 >> 1/13 Ceftriaxone 1/13 >> 1/20 Metronidazole 1/17 >> 1/22 Meropenem 1/22 >> 1/25 Amoxicillin 1/24 (x 1 for PCN Allergy challenge) Unasyn 1/25 >> 1/27 Augmentin 1/27 >>  Interim History / Subjective:  Continues improvement in mental status Less sedate, responding appropriately to verbal stimuli Following commands, though profoundly weak Remains on full vent support, less PEEP (12), variable FiO2 (60-100%) Afebrile (Tmax 99/24H), WBC 8.2 (stable) Hgb stably low, no active bleeding Wound debridement with surgery at bedside today Robust UOP s/p Lasix/Metolazone (10L) -5.7L/24H, -17.2L/admission   Objective   Blood pressure (!) 127/53, pulse 93, temperature 99 F (37.2 C), temperature source Axillary, resp. rate 20, height _0  (1.778 m), weight (!) 277.1 kg, SpO2 90 %.    Vent Mode: PCV FiO2 (%):  [65 %] 65 % Set Rate:  [18 bmp] 18 bmp PEEP:  [10 cmH20-12 cmH20] 12 cmH20 Plateau Pressure:  [22 cmH20-26 cmH20] 22 cmH20   Intake/Output Summary (Last 24 hours) at 06/28/2020 0918 Last data filed at 06/28/2020 0700 Gross per 24 hour  Intake 4863.83 ml  Output 9775 ml  Net -4911.17 ml   Filed Weights   06/23/20 0500 06/24/20 0319 06/25/20 0500  Weight: (!) 276.7 kg (!) 274 kg (!) 277.1 kg    Physical Exam: General: Morbidly obese adult male, lying in bed in NAD. HEENT: Anicteric sclera, moist mucous membranes. S/p trach. Neuro: Minimally sedated, appropriately  responsive. Following commands/moving hands and feet, profound weakness noted. CV: S1S2, RRR, no m/g/r. PULM: Breathing even and unlabored on vent (28/12, FiO2  60%); bilateral scattered rhonchi L > R, diminished breath sounds at bases. GI: Obese, soft, nontender, nondistended. Normoactive BS. Extremities: Symmetric BLE edema, 2+ pitting. Skin: Warm/dry, intertriginous erythema, venous stasis changes to BLE. Please see CCS note/Media tab for buttock wound photos.  Resolved problems:  VT arrest 1/10, Ileus 1/15, Elevated LFTs from shock, Metabolic acidosis with lactic acidosis Septic shock  Assessment & Plan:   Necrotizing fasciitis of Lt buttocks s/p debridement HCAP Persistent fever - Appreciate ID recs - Continue Augmentin (started 1/27 in the setting of hyperNa) - Surgery following, bedside debridement today - Wound care per CCS  Acute on chronic hypoxic/hypercapnic respiratory failure in setting of sepsis Volume overload Noted to have mucus plugging and dynamic airway collapse during bronchoscopy on 1/19. Spontaneous episode of desaturation 1/24AM with increased vent requirements. Ongoing high vent requirements likely d/t body habitus and previous mucous plugging. - S/p difficult trach 1/26 - Continue full vent support, min PEEP 12 (2/2 body habitus), wean as able - May need additional bronch, given prior mucus plugging and rhonchorous breath sounds - Position patient as able to maximize chest wall compliance - Daily volume status assessment - Lasix 52m IV x 1 today - Supp K as indicated  Hypotension Likely multifactorial in the setting of sepsis/sedation - Off of levophed this AM - MAP goal > 65  Hypernatremia - Slowly improving hyperNa - Continue FWF Q4H - Continue D5 - Diuresis as clinically indicated  AFib with RVR HLD - Continue amio, lipitor - Continue heparin gtt - Trend H&H - Replete lytes for K > 4, Mg > 2  DM type 2 poorly controlled with hyperglycemia - Basal Levemir BID - Standing Novolog Q4H for TF coverage - SSI  Hypothyroidism - Continue Synthroid  Acute metabolic encephalopathy 2nd to  sepsis. 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 - PAD protocol for RASS -1 to -1 - Continuous sedation remains off - Continue Lyrica/nortriptyline  Anemia of critical illness - Trend H&H on heparin gtt - Transfuse for Hgb < 7.0  Best practice (evaluated daily)  Diet: Continue TF DVT prophylaxis: Heparin gtt  GI prophylaxis: Protonix  Mobility: Bedrest Disposition: ICU Family: Updated pt's mother at bedside 1/28 Code status: Full Code  Labs    CMP Latest Ref Rng & Units 06/28/2020 06/27/2020 06/26/2020  Glucose 70 - 99 mg/dL 142(H) 126(H) 171(H)  BUN 6 - 20 mg/dL 63(H) 65(H) 63(H)  Creatinine 0.61 - 1.24 mg/dL 1.93(H) 2.23(H) 2.20(H)  Sodium 135 - 145 mmol/L 154(H) 156(H) 154(H)  Potassium 3.5 - 5.1 mmol/L 3.4(L) 3.3(L) 4.0  Chloride 98 - 111 mmol/L 107 111 112(H)  CO2 22 - 32 mmol/L 34(H) 34(H) 31  Calcium 8.9 - 10.3 mg/dL 8.0(L) 8.0(L) 7.9(L)  Total Protein 6.5 - 8.1 g/dL - - 5.8(L)  Total Bilirubin 0.3 - 1.2 mg/dL - - 0.7  Alkaline Phos 38 - 126 U/L - - 44  AST 15 - 41 U/L - - 27  ALT 0 - 44 U/L - - 25    CBC Latest Ref Rng & Units 06/28/2020 06/27/2020 06/26/2020  WBC 4.0 - 10.5 K/uL 8.2 9.0 9.0  Hemoglobin 13.0 - 17.0 g/dL 7.3(L) 7.5(L) 8.7(L)  Hematocrit 39.0 - 52.0 % 25.4(L) 27.1(L) 31.2(L)  Platelets 150 - 400 K/uL 168 179 186   ABG    Component Value Date/Time   PHART 7.401 06/23/2020 1204  PCO2ART 57.4 (H) 06/23/2020 1204   PO2ART 73 (L) 06/23/2020 1204   HCO3 35.5 (H) 06/23/2020 1204   TCO2 37 (H) 06/23/2020 1204   ACIDBASEDEF 2.0 06/13/2020 1221   O2SAT 94.0 06/23/2020 1204    CBG (last 3)  Recent Labs    06/27/20 2322 06/28/20 0327 06/28/20 0814  GLUCAP 158* 129* 154*   Critical care time: 33 minutes   Lestine Mount, PA-C [facility] 06/28/20 9:18 AM  Please see [email or web address] for pager details.

## 2020-06-28 NOTE — Progress Notes (Signed)
[facility] ADULT ICU REPLACEMENT PROTOCOL   The patient does apply for the [facility] Adult ICU Electrolyte Replacment Protocol based on the criteria listed below:   1. Is GFR >/= 30 ml/min? Yes.    Patient's GFR today is 42 2. Is SCr </= 2? Yes.   Patient's SCr is 1.93 ml/kg/hr 3. Did SCr increase >/= 0.5 in 24 hours? No. 4. Abnormal electrolyte(s): k 3.4 5. Ordered repletion with: protocol 6. If a panic level lab has been reported, has the CCM MD in charge been notified? No..   Physician:    Markus Daft A 06/28/2020 3:40 AM

## 2020-06-28 NOTE — Progress Notes (Signed)
[facility] Progress Note  17 Days Post-Op  Subjective: CC-  Seen with nursing during dressing change.  800cc liquid stool from rectal pouch.  Objective: Vital signs in last 24 hours: Temp:  [98 F (36.7 C)-99 F (37.2 C)] 99 F (37.2 C) (01/28 0817) Resp:  [16-50] 20 (01/28 0700) SpO2:  [90 %-100 %] 90 % (01/28 0817) Arterial Line BP: (110-145)/(48-67) 145/66 (01/28 0700) FiO2 (%):  [65 %] 65 % (01/28 0817) Last BM Date: 06/28/20  Intake/Output from previous day: 01/27 0701 - 01/28 0700 In: 5025.5 [I.V.:2145.5; NG/GT:2880] Out: 70623 [Urine:9975; Stool:800] Intake/Output this shift: No intake/output data recorded.  PE: Gen: Intubated Lungs: On vent Abdomen: soft, obese nontender Perirectal/perineal wound:Wound bedwith some fibrinous exudate and some beefy granulation tissue,some necrotic fatty tissue that is liquifying,as noted in the picture below. Wound drainage ismostlyserosanguinous. There is no surrounding blanching erythema.     Lab Results:  Recent Labs    06/27/20 0130 06/28/20 0215  WBC 9.0 8.2  HGB 7.5* 7.3*  HCT 27.1* 25.4*  PLT 179 168   BMET Recent Labs    06/27/20 0128 06/28/20 0215  NA 156* 154*  K 3.3* 3.4*  CL 111 107  CO2 34* 34*  GLUCOSE 126* 142*  BUN 65* 63*  CREATININE 2.23* 1.93*  CALCIUM 8.0* 8.0*   PT/INR No results for input(s): LABPROT, INR in the last 72 hours. CMP     Component Value Date/Time   NA 154 (H) 06/28/2020 0215   K 3.4 (L) 06/28/2020 0215   CL 107 06/28/2020 0215   CO2 34 (H) 06/28/2020 0215   GLUCOSE 142 (H) 06/28/2020 0215   BUN 63 (H) 06/28/2020 0215   CREATININE 1.93 (H) 06/28/2020 0215   CALCIUM 8.0 (L) 06/28/2020 0215   PROT 5.8 (L) 06/26/2020 0100   ALBUMIN 1.5 (L) 06/26/2020 0100   AST 27 06/26/2020 0100   ALT 25 06/26/2020 0100   ALKPHOS 44 06/26/2020 0100   BILITOT 0.7 06/26/2020 0100   GFRNONAA 42 (L) 06/28/2020 0215   Lipase  No results found for:  LIPASE     Studies/Results: DG CHEST PORT 1 VIEW  Result Date: 06/26/2020 CLINICAL DATA:  50 year old male status post tracheostomy. EXAM: PORTABLE CHEST 1 VIEW COMPARISON:  Chest radiograph dated 06/26/2020. FINDINGS: Interval placement of a tracheostomy with tip approximately 7 cm above the carina. Feeding tube extends below the diaphragm with tip beyond the inferior margin of the image. Right IJ central venous line in similar position. Improved aeration of the left lung compared to prior radiograph. There is cardiomegaly with vascular congestion and edema. No pneumothorax. No acute osseous pathology. IMPRESSION: 1. Interval placement of a tracheostomy with tip above the carina. 2. Improved aeration of the left lung. Electronically Signed   By: Elgie Collard M.D.   On: 06/26/2020 17:24   DG CHEST PORT 1 VIEW  Result Date: 06/26/2020 CLINICAL DATA:  Acute hypoxic respiratory failure. EXAM: PORTABLE CHEST 1 VIEW COMPARISON:  06/22/2020 FINDINGS: Right IJ catheter tip is in the projection of the SVC. ET tube tip is above the carina. There is a feeding tube with tip below the field of view. Interval complete opacification of the left hemithorax. Right lung appears relatively clear. IMPRESSION: Interval complete opacification of the left hemithorax compatible with atelectasis and/or airspace consolidation. Electronically Signed   By: Signa Kell M.D.   On: 06/26/2020 11:40    Anti-infectives: Anti-infectives (From admission, onward)   Start     Dose/Rate Route  Frequency Ordered Stop   06/27/20 1400  amoxicillin-clavulanate (AUGMENTIN) 875-125 MG per tablet 1 tablet        1 tablet Per Tube Every 8 hours 06/27/20 0939     06/24/20 1615  Ampicillin-Sulbactam (UNASYN) 3 g in sodium chloride 0.9 % 100 mL IVPB  Status:  Discontinued        3 g 200 mL/hr over 30 Minutes Intravenous Every 6 hours 06/24/20 1517 06/27/20 0939   06/24/20 1300  amoxicillin (AMOXIL) capsule 500 mg        500 mg Oral   Once 06/24/20 1241 06/24/20 1256   06/24/20 1115  amoxicillin (AMOXIL) capsule 500 mg  Status:  Discontinued        500 mg Oral  Once 06/24/20 1025 06/24/20 1241   06/22/20 1415  meropenem (MERREM) 2 g in sodium chloride 0.9 % 100 mL IVPB  Status:  Discontinued        2 g 200 mL/hr over 30 Minutes Intravenous Every 8 hours 06/22/20 1324 06/24/20 1517   06/22/20 1400  meropenem (MERREM) 1 g in sodium chloride 0.9 % 100 mL IVPB  Status:  Discontinued        1 g 200 mL/hr over 30 Minutes Intravenous Every 8 hours 06/22/20 1248 06/22/20 1324   06/22/20 1300  doxycycline (VIBRAMYCIN) 100 mg in sodium chloride 0.9 % 250 mL IVPB  Status:  Discontinued        100 mg 125 mL/hr over 120 Minutes Intravenous Every 12 hours 06/22/20 1212 06/22/20 1248   06/20/20 1100  vancomycin (VANCOCIN) 2,500 mg in sodium chloride 0.9 % 500 mL IVPB  Status:  Discontinued        2,500 mg 250 mL/hr over 120 Minutes Intravenous Every 12 hours 06/20/20 1007 06/22/20 0813   06/20/20 1100  ceFEPIme (MAXIPIME) 2 g in sodium chloride 0.9 % 100 mL IVPB  Status:  Discontinued        2 g 200 mL/hr over 30 Minutes Intravenous Every 8 hours 06/20/20 1007 06/22/20 1207   06/17/20 1130  metroNIDAZOLE (FLAGYL) IVPB 500 mg  Status:  Discontinued        500 mg 100 mL/hr over 60 Minutes Intravenous Every 8 hours 06/17/20 1041 06/22/20 1248   06/13/20 1400  cefTRIAXone (ROCEPHIN) 2 g in sodium chloride 0.9 % 100 mL IVPB  Status:  Discontinued        2 g 200 mL/hr over 30 Minutes Intravenous Every 24 hours 06/13/20 1242 06/20/20 0944   06/11/20 1300  vancomycin (VANCOREADY) IVPB 2000 mg/400 mL        2,000 mg 200 mL/hr over 120 Minutes Intravenous  Once 06/11/20 1212 06/11/20 1824   06/10/20 2000  clindamycin (CLEOCIN) IVPB 900 mg        900 mg 100 mL/hr over 30 Minutes Intravenous Every 8 hours 06/10/20 1307 06/13/20 1500   06/10/20 1600  ceFEPIme (MAXIPIME) 2 g in sodium chloride 0.9 % 100 mL IVPB  Status:  Discontinued        2  g 200 mL/hr over 30 Minutes Intravenous Every 12 hours 06/10/20 1504 06/13/20 1242   06/10/20 1400  metroNIDAZOLE (FLAGYL) IVPB 500 mg  Status:  Discontinued        500 mg 100 mL/hr over 60 Minutes Intravenous Every 8 hours 06/10/20 1023 06/10/20 1308   06/10/20 1300  aztreonam (AZACTAM) 2 g in sodium chloride 0.9 % 100 mL IVPB  Status:  Discontinued  2 g 200 mL/hr over 30 Minutes Intravenous Every 8 hours 06/10/20 1033 06/10/20 1504   06/10/20 1215  clindamycin (CLEOCIN) IVPB 900 mg        900 mg 100 mL/hr over 30 Minutes Intravenous To Surgery 06/10/20 1210 06/10/20 1210   06/10/20 1033  vancomycin variable dose per unstable renal function (pharmacist dosing)  Status:  Discontinued         Does not apply See admin instructions 06/10/20 1033 06/12/20 1252   06/10/20 0600  metroNIDAZOLE (FLAGYL) tablet 500 mg  Status:  Discontinued        500 mg Oral Every 8 hours 06/10/20 0406 06/10/20 0948   06/10/20 0415  aztreonam (AZACTAM) 2 g in sodium chloride 0.9 % 100 mL IVPB        2 g 200 mL/hr over 30 Minutes Intravenous  Once 06/10/20 0406 06/10/20 0546   06/10/20 0415  vancomycin (VANCOCIN) IVPB 1000 mg/200 mL premix  Status:  Discontinued        1,000 mg 200 mL/hr over 60 Minutes Intravenous  Once 06/10/20 0406 06/10/20 0412   06/10/20 0415  vancomycin (VANCOCIN) 2,500 mg in sodium chloride 0.9 % 500 mL IVPB        2,500 mg 250 mL/hr over 120 Minutes Intravenous  Once 06/10/20 0412 06/10/20 0903       Assessment/Plan A. Fib VT Arrest (1/10) VDRF DM2 Hypothyroidism Hx HTN  50 yo male with septic shock secondary to perianal/perineal necrotizing soft tissue infection-s/p operative debridement x2(1/10 and 1/11) - Wound examined at bedside today. No concerns for worsening infection. Small area of soft necrotic tissue debrided from distal aspect of wound today. Underlying tissuewith some fat necrosis but no operative debridement needed.  - Continue daily saline wet-to-dry  dressings.Continue rectal tube and monitor output, if decreases may need to be manually disimpacted again - Will ask PT to see for hydrotherapy -Antibiotics per ID - currently on augmentin - Appreciate CCM's assistance in management of patient shock and MMP - We will see again next week. Call sooner with any concerns.  - PATIENT IS NOT A CANDIDATE FOR COLOSTOMY SECONDARY TO BODY HABITUS - VTE: on heparin gtt for a-fib    LOS: 18 days    Franne Forts, [facility] 06/28/2020, 10:31 AM Please see Amion for pager number during day hours 7:00am-4:30pm

## 2020-06-28 NOTE — Progress Notes (Signed)
ANTICOAGULATION CONSULT NOTE  Pharmacy Consult for Heparin Indication: atrial fibrillation  Patient Measurements: Height: 5\' 10"  (177.8 cm) Weight: (!) 277.1 kg (611 lb) IBW/kg (Calculated) : 73 Heparin Dosing Weight: 149.7 kg  Vital Signs: Temp: 99.2 F (37.3 C) (01/28 1550) Temp Source: Axillary (01/28 1550)  Labs: Recent Labs    06/26/20 0219 06/26/20 0220 06/27/20 0128 06/27/20 0129 06/27/20 0130 06/27/20 1900 06/28/20 0215 06/28/20 0819 06/28/20 1753  HGB 8.7*  --   --   --  7.5*  --  7.3*  --   --   HCT 31.2*  --   --   --  27.1*  --  25.4*  --   --   PLT 186  --   --   --  179  --  168  --   --   HEPARINUNFRC  --    < >  --    < >  --    < > 0.48 0.66 0.61  CREATININE 2.20*  --  2.23*  --   --   --  1.93*  --   --    < > = values in this interval not displayed.    Estimated Creatinine Clearance: 102.4 mL/min (A) (by C-G formula based on SCr of 1.93 mg/dL (H)).   Assessment: 49YOM admitted with septic shock secondary to necrotizing fascitis, on IV heparin for Afib. Heparin level of 0.48 is therapeutic on heparin 4400 units/hr (infusing via two heparin infusions at 2200 units/hr each infusion) though level was obtained early at ~ 4 hrs after rate increase . Hgb 7.3 - slowly trending down. Plts wnl. No noted bleeding per RN or issues with IV infusion or access.   Heparin level came back therapeutic again tonight. Since heparin is being infused with 2 bags, we will continue to check q12 heparin level.   Goal of Therapy:  Heparin level 0.3-0.7 units/ml Monitor platelets by anticoagulation protocol: Yes   Plan:  Continue heparin 4400 units/hr (running two infusions at 2200 units/hr each infusion - verified with RN and inspected visually by [facility]) Monitor q12 heparin level, CBC, S/S of bleeding daily   [facility], PharmD, [place], AAHIVP, CPP Infectious Disease Pharmacist 06/28/2020 7:27 PM

## 2020-06-28 NOTE — Progress Notes (Addendum)
ANTICOAGULATION CONSULT NOTE  Pharmacy Consult for Heparin Indication: atrial fibrillation  Patient Measurements: Height: 5\' 10"  (177.8 cm) Weight: (!) 277.1 kg (611 lb) IBW/kg (Calculated) : 73 Heparin Dosing Weight: 149.7 kg  Vital Signs: Temp: 98.5 F (36.9 C) (01/28 0350) Temp Source: Esophageal (01/28 0350)  Labs: Recent Labs    06/26/20 0219 06/26/20 0220 06/27/20 0128 06/27/20 0129 06/27/20 0130 06/27/20 1900 06/28/20 0215  HGB 8.7*  --   --   --  7.5*  --  7.3*  HCT 31.2*  --   --   --  27.1*  --  25.4*  PLT 186  --   --   --  179  --  168  HEPARINUNFRC  --    < >  --  0.28*  --  0.16* 0.48  CREATININE 2.20*  --  2.23*  --   --   --  1.93*   < > = values in this interval not displayed.    Estimated Creatinine Clearance: 102.4 mL/min (A) (by C-G formula based on SCr of 1.93 mg/dL (H)).   Assessment: 49YOM admitted with septic shock secondary to necrotizing fascitis, on IV heparin for Afib. Heparin level of 0.48 is therapeutic on heparin 4400 units/hr (infusing via two heparin infusions at 2200 units/hr each infusion) though level was obtained early at ~ 4 hrs after rate increase . Hgb 7.3 - slowly trending down. Plts wnl. No noted bleeding per RN or issues with IV infusion or access.   Goal of Therapy:  Heparin level 0.3-0.7 units/ml Monitor platelets by anticoagulation protocol: Yes   Plan:  Continue heparin 4400 units/hr (running two infusions at 2200 units/hr each infusion - verified with RN and inspected visually by [facility]) Confirmatory heparin level at 0900  Monitor heparin level, CBC, S/S of bleeding daily   [facility], PharmD Clinical Pharmacist  06/28/2020, 7:23 AM   Addendum: Confirmatory heparin level 0.66 is therapeutic.   Plan: - Continue heparin 4400 units/hr (running two infusions at 2200 units/hr each) - Check heparin level at 0500 and 1700 due to two infusions running - next level at 1700 today due to first level obtained early

## 2020-06-29 DIAGNOSIS — Z930 Tracheostomy status: Secondary | ICD-10-CM | POA: Diagnosis not present

## 2020-06-29 DIAGNOSIS — Z9911 Dependence on respirator [ventilator] status: Secondary | ICD-10-CM

## 2020-06-29 DIAGNOSIS — T17500A Unspecified foreign body in bronchus causing asphyxiation, initial encounter: Secondary | ICD-10-CM | POA: Diagnosis not present

## 2020-06-29 DIAGNOSIS — J9601 Acute respiratory failure with hypoxia: Secondary | ICD-10-CM | POA: Diagnosis not present

## 2020-06-29 LAB — BASIC METABOLIC PANEL
Anion gap: 13 (ref 5–15)
BUN: 54 mg/dL — ABNORMAL HIGH (ref 6–20)
CO2: 34 mmol/L — ABNORMAL HIGH (ref 22–32)
Calcium: 8.4 mg/dL — ABNORMAL LOW (ref 8.9–10.3)
Chloride: 105 mmol/L (ref 98–111)
Creatinine, Ser: 1.75 mg/dL — ABNORMAL HIGH (ref 0.61–1.24)
GFR, Estimated: 47 mL/min — ABNORMAL LOW (ref >60)
Glucose, Bld: 147 mg/dL — ABNORMAL HIGH (ref 70–99)
Potassium: 3.7 mmol/L (ref 3.5–5.1)
Sodium: 152 mmol/L — ABNORMAL HIGH (ref 135–145)

## 2020-06-29 LAB — MAGNESIUM: Magnesium: 1.9 mg/dL (ref 1.7–2.4)

## 2020-06-29 LAB — GLUCOSE, CAPILLARY
Glucose-Capillary: 106 mg/dL — ABNORMAL HIGH (ref 70–99)
Glucose-Capillary: 108 mg/dL — ABNORMAL HIGH (ref 70–99)
Glucose-Capillary: 129 mg/dL — ABNORMAL HIGH (ref 70–99)
Glucose-Capillary: 132 mg/dL — ABNORMAL HIGH (ref 70–99)
Glucose-Capillary: 132 mg/dL — ABNORMAL HIGH (ref 70–99)
Glucose-Capillary: 135 mg/dL — ABNORMAL HIGH (ref 70–99)

## 2020-06-29 LAB — CBC
HCT: 28.3 % — ABNORMAL LOW (ref 39.0–52.0)
Hemoglobin: 8.1 g/dL — ABNORMAL LOW (ref 13.0–17.0)
MCH: 27.6 pg (ref 26.0–34.0)
MCHC: 28.6 g/dL — ABNORMAL LOW (ref 30.0–36.0)
MCV: 96.3 fL (ref 80.0–100.0)
Platelets: 188 10*3/uL (ref 150–400)
RBC: 2.94 MIL/uL — ABNORMAL LOW (ref 4.22–5.81)
RDW: 17.7 % — ABNORMAL HIGH (ref 11.5–15.5)
WBC: 9.2 10*3/uL (ref 4.0–10.5)
nRBC: 0.2 % (ref 0.0–0.2)

## 2020-06-29 LAB — HEPARIN LEVEL (UNFRACTIONATED)
Heparin Unfractionated: 0.5 IU/mL (ref 0.30–0.70)
Heparin Unfractionated: 0.56 IU/mL (ref 0.30–0.70)

## 2020-06-29 LAB — TRIGLYCERIDES: Triglycerides: 110 mg/dL (ref <150)

## 2020-06-29 MED ORDER — COLLAGENASE 250 UNIT/GM EX OINT
1.0000 "application " | TOPICAL_OINTMENT | Freq: Every day | CUTANEOUS | Status: DC
  Administered 2020-06-30 – 2020-07-28 (×27): 1 via TOPICAL
  Filled 2020-06-29 (×8): qty 30

## 2020-06-29 MED ORDER — ALBUTEROL SULFATE (2.5 MG/3ML) 0.083% IN NEBU
INHALATION_SOLUTION | RESPIRATORY_TRACT | Status: AC
  Filled 2020-06-29: qty 3

## 2020-06-29 MED ORDER — ACETYLCYSTEINE 20 % IN SOLN
4.0000 mL | Freq: Two times a day (BID) | RESPIRATORY_TRACT | Status: AC
  Administered 2020-06-29 – 2020-07-03 (×7): 4 mL via RESPIRATORY_TRACT
  Filled 2020-06-29 (×13): qty 4

## 2020-06-29 MED ORDER — ALBUTEROL SULFATE (2.5 MG/3ML) 0.083% IN NEBU
2.5000 mg | INHALATION_SOLUTION | Freq: Two times a day (BID) | RESPIRATORY_TRACT | Status: DC
  Administered 2020-06-29 – 2020-07-04 (×10): 2.5 mg via RESPIRATORY_TRACT
  Filled 2020-06-29 (×13): qty 3

## 2020-06-29 NOTE — Progress Notes (Signed)
Physical Therapy Wound Treatment Patient Details  Name: Carl Hill MRN: [id number] Date of Birth: [birth year]  Today's Date: 06/29/2020 Time: 2993-7169 Time Calculation (min): 54 min  Subjective  Subjective: Pt alert, on vent. Patient and Family Stated Goals: could not state Prior Treatments: Debridement 06/11/2020  Pain Score:  4/10 (premedicated)  Wound Assessment  Wound / Incision (Open or Dehisced) 06/29/20 Buttocks (Active)  Wound Image   06/29/20 1453  Dressing Type ABD;Barrier Film (skin prep);Gauze (Comment);Moist to moist 06/29/20 1453  Dressing Changed Changed 06/29/20 1453  Dressing Status Dry;Clean;Intact 06/29/20 1453  Dressing Change Frequency Daily 06/29/20 1453  Site / Wound Assessment Bleeding;Black;Red;Yellow 06/29/20 1453  % Wound base Red or Granulating 60% 06/29/20 1453  % Wound base Yellow/Fibrinous Exudate 30% 06/29/20 1453  % Wound base Black/Eschar 10% 06/29/20 1453  % Wound base Other/Granulation Tissue (Comment) 0% 06/29/20 1453  Peri-wound Assessment Intact 06/29/20 1453  Wound Length (cm) 25 cm 06/29/20 1453  Wound Width (cm) 11 cm 06/29/20 1453  Wound Depth (cm) 10 cm 06/29/20 1453  Wound Volume (cm^3) 2750 cm^3 06/29/20 1453  Wound Surface Area (cm^2) 275 cm^2 06/29/20 1453  Margins Unattached edges (unapproximated) 06/29/20 1453  Drainage Amount Copious 06/29/20 1453  Drainage Description Serosanguineous 06/29/20 1453  Treatment Debridement (Selective);Hydrotherapy (Pulse lavage);Packing (Saline gauze) 06/29/20 1453  Hydrotherapy Pulsed lavage therapy - wound location: buttocks Pulsed Lavage with Suction (psi): 12 psi Pulsed Lavage with Suction - Normal Saline Used: 2000 mL Pulsed Lavage Tip: Tip with splash shield Selective Debridement Selective Debridement - Location: buttocks Selective Debridement - Tools Used: Forceps;Scalpel Selective Debridement - Tissue Removed: yellow slough, eschar   Wound Assessment and Plan  Wound Therapy -  Assess/Plan/Recommendations Wound Therapy - Clinical Statement: Pt presents to hydrotherapy with buttocks wound s/p debridement 06/11/2020. Pt will benefit from selective debridement and pulsatile lavage in order to cleanse wound and decrease bioburden. Wound Therapy - Functional Problem List: decreased mobility, obesity Factors Delaying/Impairing Wound Healing: Immobility;Diabetes Mellitus Hydrotherapy Plan: Debridement;Patient/family education;Pulsatile lavage with suction Wound Therapy - Frequency: 6X / week Wound Therapy - Follow Up Recommendations: Other (comment) (LTACH) Wound Plan: see above  Wound Therapy Goals- Improve the function of patient's integumentary system by progressing the wound(s) through the phases of wound healing (inflammation - proliferation - remodeling) by: Decrease Necrotic Tissue to: 20 Decrease Necrotic Tissue - Progress: Goal set today Increase Granulation Tissue to: 80 Increase Granulation Tissue - Progress: Goal set today Goals/treatment plan/discharge plan were made with and agreed upon by patient/family: Yes Time For Goal Achievement: 7 days Wound Therapy - Potential for Goals: Fair  Goals will be updated until maximal potential achieved or discharge criteria met.  Discharge criteria: when goals achieved, discharge from hospital, MD decision/surgical intervention, no progress towards goals, refusal/missing three consecutive treatments without notification or medical reason.  GP     Wyona Almas, PT, DPT Acute Rehabilitation Services Pager [phone] Office [phone]  Deno Etienne 06/29/2020, 3:11 PM

## 2020-06-29 NOTE — Progress Notes (Signed)
Spoke with pt's mother and updated about current status and treatment plan.  Coralyn Helling, MD [facility] Pulmonary/Critical Care Pager - [phone] 06/29/2020, 4:54 PM

## 2020-06-29 NOTE — Progress Notes (Signed)
ANTICOAGULATION CONSULT NOTE  Pharmacy Consult for Heparin Indication: atrial fibrillation  Patient Measurements: Height: 5\' 10"  (177.8 cm) Weight: (!) 277.1 kg (611 lb) IBW/kg (Calculated) : 73 Heparin Dosing Weight: 149.7 kg  Vital Signs: Temp: 99.7 F (37.6 C) (01/29 0838) Temp Source: Axillary (01/29 0838) BP: 143/64 (01/29 0740) Pulse Rate: 121 (01/29 0740)  Labs: Recent Labs    06/27/20 0128 06/27/20 0129 06/27/20 0130 06/27/20 1900 06/28/20 0215 06/28/20 0819 06/28/20 1753 06/29/20 0348 06/29/20 0350  HGB  --    < > 7.5*  --  7.3*  --   --  8.1*  --   HCT  --   --  27.1*  --  25.4*  --   --  28.3*  --   PLT  --   --  179  --  168  --   --  188  --   HEPARINUNFRC  --    < >  --    < > 0.48 0.66 0.61  --  0.50  CREATININE 2.23*  --   --   --  1.93*  --   --  1.75*  --    < > = values in this interval not displayed.    Estimated Creatinine Clearance: 112.9 mL/min (A) (by C-G formula based on SCr of 1.75 mg/dL (H)).   Assessment: 49YOM admitted with septic shock secondary to necrotizing fascitis, on IV heparin for Afib.   Heparin level came back therapeutic  Goal of Therapy:  Heparin level 0.3-0.7 units/ml Monitor platelets by anticoagulation protocol: Yes   Plan:  Continue heparin 4400 units/hr (running two infusions at 2200 units/hr each infusion - verified with RN and inspected visually by [facility]) Monitor q12 heparin level, CBC, S/S of bleeding daily   [facility], PharmD, BCPS, BCCCP Clinical Pharmacist [phone]  Please check AMION for all [facility] Pharmacy numbers  06/29/2020 9:10 AM

## 2020-06-29 NOTE — Progress Notes (Signed)
NAME:  Carl Hill, MRN:  [id number], DOB:  16-Apr-1971, LOS: 84 ADMISSION DATE:  06/10/2020, CONSULTATION DATE:  06/10/2020 REFERRING MD:  Lorin Mercy, CHIEF COMPLAINT:  Severe Sepsis    Brief History:  50 year old male who presented with buttock pain, fatigue and malaise.  Found to have hyperglycemia. CT pelvis demonstrated necrotizing fasciitis in L buttock, L upper thigh, and perineum. Taken to OR for debridement 1/10 c/b Afib with RVR and VT arrest s/p CPR/defib. Trached 1/26, remains on full vent support.  Past Medical History:  HTN, obesity, DM, AFib on DOAC  Significant Hospital Events:  1/10  Admitted to [facility]. General surgery consulted and debridement pursued. In the afternoon, patient developed hypotension with AF in RVR. Synchronized cardioversion unsuccessful with conversion to VT. CPR initated with ROSC after desynchronized defibrillation. 1/11 Debridement 1/18 Mucus plugging 1/19 Bronchoscopy >> mucus plug on Rt, airway collapse with exhalation 1/20 Persistent fever, change ABx 1/24 Increased vent requirements, habitus related +/- mucus plugging 1/25 Hypotensive to SBP 80s, Levo resumed, minimally responsive overnight off sedation, CT Head negative 1/26 Stable vent/pressor requirements, bronched, tracheostomy  Consults:  General Surgery, ID  Procedures:  ETT 1/10 >>  R IJ CVL 1/10 >>  L brachial A-line 1/10 >>   Significant Diagnostic Tests:   CT abd/pelvis 1/10 >> Extensive subcutaneous emphysema in the medial aspect of the upper thighs extending through perineum and superiorly to the medial left buttock. Gas extends into the pelvis surrounding the anus inferiorly and tracking superiorly along the left wall of the rectum.  CT C/A/P 1/22 >> Imaging limited due to body habitus. No gross fluid collection or gas seen. Bilateral consolidation with dependent atelectasis  CT Head 1/25 >> No acute intracranial abnormality  Micro Data:  COVID/Flu 1/10 >> negative MRSA PCR  1/10 >> negative L buttocks wound 1/10 >> E. Coli (pansensitive), Streptococcus infantarious (sensitive to PCN, ceftriaxone, vanc), Peptostreptococcus species, rare GBS Blood 1/10 >> negative BAL 1/20 >> normal flora  Antimicrobials:  Aztreonam 1/09 Metronidazole 1/09 Vancomycin 1/09, 1/11, 1/20 >>1/22 Cefepime 1/10 >> 1/13, 1/20 >>1/22 Clindamycin 1/10 >> 1/13 Ceftriaxone 1/13 >> 1/20 Metronidazole 1/17 >> 1/22 Meropenem 1/22 >> 1/25 Amoxicillin 1/24 (x 1 for PCN Allergy challenge) Unasyn 1/25 >> 1/27 Augmentin 1/27 >>  Interim History / Subjective:  Remains on increased PEEP/FiO2.   Objective   Blood pressure (!) 143/64, pulse (!) 121, temperature 99.7 F (37.6 C), temperature source Axillary, resp. rate (!) 22, height _0  (1.778 m), weight (!) 277.1 kg, SpO2 92 %.    Vent Mode: PCV FiO2 (%):  [65 %-100 %] 80 % Set Rate:  [18 bmp] 18 bmp PEEP:  [12 cmH20] 12 cmH20 Pressure Support:  [12 cmH20] 12 cmH20 Plateau Pressure:  [30 cmH20-37 cmH20] 37 cmH20   Intake/Output Summary (Last 24 hours) at 06/29/2020 1045 Last data filed at 06/29/2020 0800 Gross per 24 hour  Intake 3803.59 ml  Output 9300 ml  Net -5496.41 ml   Filed Weights   06/23/20 0500 06/24/20 0319 06/25/20 0500  Weight: (!) 276.7 kg (!) 274 kg (!) 277.1 kg    Physical Exam:  General - sedated Eyes - pupils reactive ENT - trach site clean Cardiac - regular, tachycardic Chest - b/l rhonchi Abdomen - soft, non tender, decreased bowel sounds Extremities - 1+ non pitting edema Skin - wound dressing clean Neuro - RASS -2  Resolved problems:  VT arrest 1/10, Ileus 1/15, Elevated LFTs from shock, Metabolic acidosis with lactic acidosis, Septic  shock  Assessment & Plan:   Sepsis from Lt buttock necrotizing fasciitis with E coli, Group B Streptococcus, and Peptostreptococcus in wound culture. Bacterial HCAP. - wound care per surgery - continue ABx  Acute on chronic hypoxic/hypercapnic respiratory  failure in setting of sepsis, HCAP, recurrent mucus plugging. 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 Bronchomalacia. Probable sleep disordered breathing. - adjust FiO to keep SpO2 90 to 95% - keep PEEP at 12 cm H2O - using pressure control - trach care - bronchial hygiene - f/u CXR - will eventually need LTAC if he has insurance benefits for this  Hypernatremia from diuresis and insensible losses. - hold lasix - continue free water - continue D5W at 40 ml/hr - f/u BMET  New on set A fib with RVR >> back in sinus rhythm. Hx of HLD. - continue amiodarone, lipitor - continue heparin gtt  DM type 2 poorly controlled with hyperglycemia - SSI with tube feed coverage and levemir  Hypothyroidism - Continue Synthroid  Acute metabolic encephalopathy 2nd to sepsis, hypoxia, hypercapnia. 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 - RASS goal 0 to -1 - Continue Lyrica/nortriptyline  Anemia of critical illness. - f/u CBC - transfuse for Hb < 7 or significant bleeding  Best practice (evaluated daily)  Diet: Continue TF DVT prophylaxis: Heparin gtt  GI prophylaxis: Protonix  Mobility: Bedrest Disposition: ICU Family: Updated pt's mother at bedside 1/28 Code status: Full Code  Labs    CMP Latest Ref Rng & Units 06/29/2020 06/28/2020 06/27/2020  Glucose 70 - 99 mg/dL 147(H) 142(H) 126(H)  BUN 6 - 20 mg/dL 54(H) 63(H) 65(H)  Creatinine 0.61 - 1.24 mg/dL 1.75(H) 1.93(H) 2.23(H)  Sodium 135 - 145 mmol/L 152(H) 154(H) 156(H)  Potassium 3.5 - 5.1 mmol/L 3.7 3.4(L) 3.3(L)  Chloride 98 - 111 mmol/L 105 107 111  CO2 22 - 32 mmol/L 34(H) 34(H) 34(H)  Calcium 8.9 - 10.3 mg/dL 8.4(L) 8.0(L) 8.0(L)  Total Protein 6.5 - 8.1 g/dL - - -  Total Bilirubin 0.3 - 1.2 mg/dL - - -  Alkaline Phos 38 - 126 U/L - - -  AST 15 - 41 U/L - - -  ALT 0 - 44 U/L - - -    CBC Latest Ref Rng & Units 06/29/2020 06/28/2020 06/27/2020  WBC 4.0 - 10.5 K/uL 9.2 8.2 9.0  Hemoglobin 13.0 - 17.0 g/dL 8.1(L) 7.3(L) 7.5(L)   Hematocrit 39.0 - 52.0 % 28.3(L) 25.4(L) 27.1(L)  Platelets 150 - 400 K/uL 188 168 179   ABG    Component Value Date/Time   PHART 7.401 06/23/2020 1204   PCO2ART 57.4 (H) 06/23/2020 1204   PO2ART 73 (L) 06/23/2020 1204   HCO3 35.5 (H) 06/23/2020 1204   TCO2 37 (H) 06/23/2020 1204   ACIDBASEDEF 2.0 06/13/2020 1221   O2SAT 94.0 06/23/2020 1204    CBG (last 3)  Recent Labs    06/28/20 2344 06/29/20 0349 06/29/20 0757  GLUCAP 135* 132* 135*   Critical care time: 34 minutes  Chesley Mires, MD [place] Pager - [phone] 06/29/2020, 10:54 AM

## 2020-06-29 NOTE — Progress Notes (Signed)
ANTICOAGULATION CONSULT NOTE  Pharmacy Consult for Heparin Indication: atrial fibrillation  Patient Measurements: Height: 5\' 10"  (177.8 cm) Weight: (!) 277.1 kg (611 lb) IBW/kg (Calculated) : 73 Heparin Dosing Weight: 149.7 kg  Vital Signs: Temp: 99.4 F (37.4 C) (01/29 1800) Temp Source: Esophageal (01/29 1800) BP: 138/61 (01/29 1503) Pulse Rate: 114 (01/29 1503)  Labs: Recent Labs    06/27/20 0128 06/27/20 0129 06/27/20 0130 06/27/20 1900 06/28/20 0215 06/28/20 0819 06/28/20 1753 06/29/20 0348 06/29/20 0350 06/29/20 1815  HGB  --    < > 7.5*  --  7.3*  --   --  8.1*  --   --   HCT  --   --  27.1*  --  25.4*  --   --  28.3*  --   --   PLT  --   --  179  --  168  --   --  188  --   --   HEPARINUNFRC  --    < >  --    < > 0.48   < > 0.61  --  0.50 0.56  CREATININE 2.23*  --   --   --  1.93*  --   --  1.75*  --   --    < > = values in this interval not displayed.    Estimated Creatinine Clearance: 112.9 mL/min (A) (by C-G formula based on SCr of 1.75 mg/dL (H)).   Assessment: 49YOM admitted with septic shock secondary to necrotizing fascitis, on IV heparin for Afib.   Heparin level 0.56 is therapeutic. H/H, plt stable. No reported bleeding.   Goal of Therapy:  Heparin level 0.3-0.7 units/ml Monitor platelets by anticoagulation protocol: Yes   Plan:  Continue heparin 4400 units/hr (running two infusions at 2200 units/hr each infusion - verified with RN and inspected visually by [facility]) Monitor q12 heparin level, CBC, S/S of bleeding daily   [facility], PharmD, BCPS, [facility] Clinical Pharmacist  Please check AMION for all [facility] Pharmacy phone numbers After 10:00 PM, call Main Pharmacy [phone]

## 2020-06-30 ENCOUNTER — Inpatient Hospital Stay (HOSPITAL_COMMUNITY): Payer: Medicaid Other

## 2020-06-30 DIAGNOSIS — J9601 Acute respiratory failure with hypoxia: Secondary | ICD-10-CM | POA: Diagnosis not present

## 2020-06-30 DIAGNOSIS — N493 Fournier gangrene: Secondary | ICD-10-CM | POA: Diagnosis not present

## 2020-06-30 DIAGNOSIS — I482 Chronic atrial fibrillation, unspecified: Secondary | ICD-10-CM | POA: Diagnosis not present

## 2020-06-30 LAB — CBC
HCT: 27.4 % — ABNORMAL LOW (ref 39.0–52.0)
Hemoglobin: 7.8 g/dL — ABNORMAL LOW (ref 13.0–17.0)
MCH: 27.6 pg (ref 26.0–34.0)
MCHC: 28.5 g/dL — ABNORMAL LOW (ref 30.0–36.0)
MCV: 96.8 fL (ref 80.0–100.0)
Platelets: 169 10*3/uL (ref 150–400)
RBC: 2.83 MIL/uL — ABNORMAL LOW (ref 4.22–5.81)
RDW: 18 % — ABNORMAL HIGH (ref 11.5–15.5)
WBC: 10.1 10*3/uL (ref 4.0–10.5)
nRBC: 0.3 % — ABNORMAL HIGH (ref 0.0–0.2)

## 2020-06-30 LAB — BASIC METABOLIC PANEL
Anion gap: 9 (ref 5–15)
BUN: 53 mg/dL — ABNORMAL HIGH (ref 6–20)
CO2: 35 mmol/L — ABNORMAL HIGH (ref 22–32)
Calcium: 8.1 mg/dL — ABNORMAL LOW (ref 8.9–10.3)
Chloride: 105 mmol/L (ref 98–111)
Creatinine, Ser: 1.69 mg/dL — ABNORMAL HIGH (ref 0.61–1.24)
GFR, Estimated: 49 mL/min — ABNORMAL LOW (ref >60)
Glucose, Bld: 136 mg/dL — ABNORMAL HIGH (ref 70–99)
Potassium: 3.7 mmol/L (ref 3.5–5.1)
Sodium: 149 mmol/L — ABNORMAL HIGH (ref 135–145)

## 2020-06-30 LAB — GLUCOSE, CAPILLARY
Glucose-Capillary: 123 mg/dL — ABNORMAL HIGH (ref 70–99)
Glucose-Capillary: 121 mg/dL — ABNORMAL HIGH (ref 70–99)
Glucose-Capillary: 117 mg/dL — ABNORMAL HIGH (ref 70–99)
Glucose-Capillary: 124 mg/dL — ABNORMAL HIGH (ref 70–99)
Glucose-Capillary: 130 mg/dL — ABNORMAL HIGH (ref 70–99)

## 2020-06-30 LAB — HEPARIN LEVEL (UNFRACTIONATED): Heparin Unfractionated: 0.63 IU/mL (ref 0.30–0.70)

## 2020-06-30 MED ORDER — ENOXAPARIN SODIUM 300 MG/3ML IJ SOLN
1.0000 mg/kg | Freq: Two times a day (BID) | INTRAMUSCULAR | Status: DC
  Administered 2020-06-30 – 2020-07-01 (×3): 270 mg via SUBCUTANEOUS
  Filled 2020-06-30 (×4): qty 2.7

## 2020-06-30 MED ORDER — WARFARIN SODIUM 7.5 MG PO TABS
15.0000 mg | ORAL_TABLET | Freq: Once | ORAL | Status: AC
  Administered 2020-06-30: 15 mg via ORAL
  Filled 2020-06-30: qty 2

## 2020-06-30 MED ORDER — WARFARIN - PHARMACIST DOSING INPATIENT: Freq: Every day | Status: DC

## 2020-06-30 NOTE — Progress Notes (Signed)
Spoke to pt's mother and updated about current status.  Coralyn Helling, MD [facility] Pulmonary/Critical Care Pager - [phone] 06/30/2020, 1:50 PM

## 2020-06-30 NOTE — Progress Notes (Signed)
NAME:  Carl Hill, MRN:  [id number], DOB:  10/28/1970, LOS: 10 ADMISSION DATE:  06/10/2020, CONSULTATION DATE:  06/10/2020 REFERRING MD:  Lorin Mercy, CHIEF COMPLAINT:  Severe Sepsis    Brief History:  50 year old male who presented with buttock pain, fatigue and malaise.  Found to have hyperglycemia. CT pelvis demonstrated necrotizing fasciitis in L buttock, L upper thigh, and perineum. Taken to OR for debridement 1/10 c/b Afib with RVR and VT arrest s/p CPR/defib. Trached 1/26, remains on full vent support.  Past Medical History:  HTN, obesity, DM, AFib on DOAC  Significant Hospital Events:  1/10  Admitted to [facility]. General surgery consulted and debridement pursued. In the afternoon, patient developed hypotension with AF in RVR. Synchronized cardioversion unsuccessful with conversion to VT. CPR initated with ROSC after desynchronized defibrillation. 1/11 Debridement 1/18 Mucus plugging 1/19 Bronchoscopy >> mucus plug on Rt, airway collapse with exhalation 1/20 Persistent fever, change ABx 1/24 Increased vent requirements, habitus related +/- mucus plugging 1/25 Hypotensive to SBP 80s, Levo resumed, minimally responsive overnight off sedation, CT Head negative 1/26 Stable vent/pressor requirements, bronched, tracheostomy 1/29 start hydrotherapy 1/30 start lovenox/coumadin  Consults:  General Surgery ID s/o 1/25  Procedures:  ETT 1/10 >>  R IJ CVL 1/10 >>  L brachial A-line 1/10 >>   Significant Diagnostic Tests:   CT abd/pelvis 1/10 >> Extensive subcutaneous emphysema in the medial aspect of the upper thighs extending through perineum and superiorly to the medial left buttock. Gas extends into the pelvis surrounding the anus inferiorly and tracking superiorly along the left wall of the rectum.  CT C/A/P 1/22 >> Imaging limited due to body habitus. No gross fluid collection or gas seen. Bilateral consolidation with dependent atelectasis  CT Head 1/25 >> No acute intracranial  abnormality  Micro Data:  COVID/Flu 1/10 >> negative MRSA PCR 1/10 >> negative L buttocks wound 1/10 >> E. Coli (pansensitive), Streptococcus infantarious (sensitive to PCN, ceftriaxone, vanc), Peptostreptococcus species, rare GBS Blood 1/10 >> negative BAL 1/20 >> normal flora  Antimicrobials:  Aztreonam 1/09 Metronidazole 1/09 Vancomycin 1/09, 1/11, 1/20 >>1/22 Cefepime 1/10 >> 1/13, 1/20 >>1/22 Clindamycin 1/10 >> 1/13 Ceftriaxone 1/13 >> 1/20 Metronidazole 1/17 >> 1/22 Meropenem 1/22 >> 1/25 Amoxicillin 1/24 (x 1 for PCN Allergy challenge) Unasyn 1/25 >> 1/27 Augmentin 1/27 >>  Interim History / Subjective:  Off sedation.  Remains on increased PEEP/FiO2.     Objective   Blood pressure 130/68, pulse (!) 111, temperature 98.6 F (37 C), temperature source Esophageal, resp. rate 17, height _0  (1.778 m), weight (!) 258.6 kg, SpO2 96 %.    Vent Mode: PCV FiO2 (%):  [70 %-90 %] 80 % Set Rate:  [18 bmp] 18 bmp PEEP:  [12 cmH20] 12 cmH20 Plateau Pressure:  [26 cmH20-29 cmH20] 29 cmH20   Intake/Output Summary (Last 24 hours) at 06/30/2020 0949 Last data filed at 06/30/2020 0900 Gross per 24 hour  Intake 4752.95 ml  Output 8125 ml  Net -3372.05 ml   Filed Weights   06/24/20 0319 06/25/20 0500 06/30/20 0500  Weight: (!) 274 kg (!) 277.1 kg (!) 258.6 kg    Physical Exam:  General - somnolent Eyes - pupils reactive ENT - cortrak in, trach site clean Cardiac - regular, tachycardic, no murmur Chest - better air movement in upper lung fields, b/l crackles Abdomen - soft, non tender, distant bowel sounds Extremities - legs in wrap Skin - wound dressing clean Neuro - opens eyes spontaneously, not following commands  Resolved problems:  VT arrest 1/10, Ileus 1/15, Elevated LFTs from shock, Metabolic acidosis with lactic acidosis, Septic shock  Assessment & Plan:   Sepsis from Lt buttock necrotizing fasciitis with E coli, Group B Streptococcus, and Peptostreptococcus  in wound culture. Bacterial HCAP. - started on hydrotherapy 1/29 - surgery following for wound care - day 21 of ABx, currently on augmentin  Acute on chronic hypoxic/hypercapnic respiratory failure in setting of sepsis, HCAP, recurrent mucus plugging. 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 Bronchomalacia. Probable sleep disordered breathing. - adjust FiO2  to keep SpO2 90 to 95% - keep PEEP at 12 cm H2O - using pressure control mode of ventilation - continue scheduled albuterol and mucomyst for now - f/u CXR - would like to defer repeated bronchoscopy if able - trach care, bronchial hygiene - will eventually need LTAC if he has insurance benefits for this  Hypernatremia from diuresis and insensible losses. - improved on 1/30 - continue to hold lasix - d/c D5W IV fluid - continue free water - f/u BMET  New on set A fib with RVR >> back in sinus rhythm. Hx of HLD. - continue amiodarone, lipitor - start lovenox/coumadin per pharmacy on 1/30 and transition off heparin gtt  DM type 2 poorly controlled with hyperglycemia - SSI with tube feed coverage and levemir  Hypothyroidism - Continue Synthroid  Acute metabolic encephalopathy 2nd to sepsis, hypoxia, hypercapnia. 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 - off sedation infusions since 06/25/20; suspect he will need prolonged time for clearance due to obesity - RASS goal 0 to -1 - Continue Lyrica/nortriptyline  Anemia of critical illness. - f/u CBC - transfuse for Hb < 7 or significant bleeding  Best practice (evaluated daily)  Diet: Continue TF DVT prophylaxis: lovenox/coumadin GI prophylaxis: Protonix  Mobility: Bedrest Disposition: ICU Family: Updated pt's mother at bedside 1/29 Code status: Full Code  Labs    CMP Latest Ref Rng & Units 06/30/2020 06/29/2020 06/28/2020  Glucose 70 - 99 mg/dL 136(H) 147(H) 142(H)  BUN 6 - 20 mg/dL 53(H) 54(H) 63(H)  Creatinine 0.61 - 1.24 mg/dL 1.69(H) 1.75(H) 1.93(H)  Sodium 135 - 145  mmol/L 149(H) 152(H) 154(H)  Potassium 3.5 - 5.1 mmol/L 3.7 3.7 3.4(L)  Chloride 98 - 111 mmol/L 105 105 107  CO2 22 - 32 mmol/L 35(H) 34(H) 34(H)  Calcium 8.9 - 10.3 mg/dL 8.1(L) 8.4(L) 8.0(L)  Total Protein 6.5 - 8.1 g/dL - - -  Total Bilirubin 0.3 - 1.2 mg/dL - - -  Alkaline Phos 38 - 126 U/L - - -  AST 15 - 41 U/L - - -  ALT 0 - 44 U/L - - -    CBC Latest Ref Rng & Units 06/30/2020 06/29/2020 06/28/2020  WBC 4.0 - 10.5 K/uL 10.1 9.2 8.2  Hemoglobin 13.0 - 17.0 g/dL 7.8(L) 8.1(L) 7.3(L)  Hematocrit 39.0 - 52.0 % 27.4(L) 28.3(L) 25.4(L)  Platelets 150 - 400 K/uL 169 188 168   ABG    Component Value Date/Time   PHART 7.401 06/23/2020 1204   PCO2ART 57.4 (H) 06/23/2020 1204   PO2ART 73 (L) 06/23/2020 1204   HCO3 35.5 (H) 06/23/2020 1204   TCO2 37 (H) 06/23/2020 1204   ACIDBASEDEF 2.0 06/13/2020 1221   O2SAT 94.0 06/23/2020 1204    CBG (last 3)  Recent Labs    06/29/20 2331 06/30/20 0354 06/30/20 0756  GLUCAP 129* 123* 121*   Signature:  Chesley Mires, MD [place] Pager - [phone] 06/30/2020, 9:49 AM

## 2020-06-30 NOTE — Progress Notes (Addendum)
ANTICOAGULATION CONSULT NOTE  Pharmacy Consult for Heparin Indication: atrial fibrillation  Patient Measurements: Height: 5\' 10"  (177.8 cm) Weight: (!) 258.6 kg (570 lb) IBW/kg (Calculated) : 73 Heparin Dosing Weight: 149.7 kg  Vital Signs: Temp: 98.1 F (36.7 C) (01/30 0600) Temp Source: Esophageal (01/30 0600)  Labs: Recent Labs    06/28/20 0215 06/28/20 0819 06/29/20 0348 06/29/20 0350 06/29/20 1815 06/30/20 0321  HGB 7.3*  --  8.1*  --   --  7.8*  HCT 25.4*  --  28.3*  --   --  27.4*  PLT 168  --  188  --   --  169  HEPARINUNFRC 0.48   < >  --  0.50 0.56 0.63  CREATININE 1.93*  --  1.75*  --   --  1.69*   < > = values in this interval not displayed.    Estimated Creatinine Clearance: 111.3 mL/min (A) (by C-G formula based on SCr of 1.69 mg/dL (H)).   Assessment: 49YOM admitted with septic shock secondary to necrotizing fascitis, on IV heparin for Afib.   Heparin level 0.63 is therapeutic. H/H, plt stable. No reported bleeding.  lvl has been creeping up last few days   Goal of Therapy:  Heparin level 0.3-0.7 units/ml Monitor platelets by anticoagulation protocol: Yes   Plan:  Slightly reduce heparin to 4300 units/hr (running two infusions at 2150 units/hr each infusion - verified with RN and inspected visually by [facility]) - attempting to prevent high level Monitor q12 heparin level, CBC, S/S of bleeding daily   [facility], PharmD, BCPS, BCCCP Clinical Pharmacist [phone]  Please check AMION for all [facility] Pharmacy numbers  06/30/2020 7:56 AM   ADDENDUM 11:09 AM   Asked to convert patient to warfarin.  Plan: DC Heparin Enoxaparin 1 mg/kg q12h - unsure which is accurate weight, will use 600 lb as weight and adjust as needed based on lvls Warfarin 15 mg x 1 Daily INR CBC

## 2020-07-01 ENCOUNTER — Inpatient Hospital Stay (HOSPITAL_COMMUNITY): Payer: Medicaid Other

## 2020-07-01 DIAGNOSIS — N493 Fournier gangrene: Secondary | ICD-10-CM | POA: Diagnosis not present

## 2020-07-01 LAB — BASIC METABOLIC PANEL
Anion gap: 13 (ref 5–15)
BUN: 47 mg/dL — ABNORMAL HIGH (ref 6–20)
CO2: 32 mmol/L (ref 22–32)
Calcium: 8.4 mg/dL — ABNORMAL LOW (ref 8.9–10.3)
Chloride: 102 mmol/L (ref 98–111)
Creatinine, Ser: 1.49 mg/dL — ABNORMAL HIGH (ref 0.61–1.24)
GFR, Estimated: 58 mL/min — ABNORMAL LOW (ref >60)
Glucose, Bld: 141 mg/dL — ABNORMAL HIGH (ref 70–99)
Potassium: 3.5 mmol/L (ref 3.5–5.1)
Sodium: 147 mmol/L — ABNORMAL HIGH (ref 135–145)

## 2020-07-01 LAB — CBC
HCT: 22.4 % — ABNORMAL LOW (ref 39.0–52.0)
Hemoglobin: 6.7 g/dL — CL (ref 13.0–17.0)
MCH: 28.6 pg (ref 26.0–34.0)
MCHC: 29.9 g/dL — ABNORMAL LOW (ref 30.0–36.0)
MCV: 95.7 fL (ref 80.0–100.0)
Platelets: 169 10*3/uL (ref 150–400)
RBC: 2.34 MIL/uL — ABNORMAL LOW (ref 4.22–5.81)
RDW: 17.8 % — ABNORMAL HIGH (ref 11.5–15.5)
WBC: 10.6 10*3/uL — ABNORMAL HIGH (ref 4.0–10.5)
nRBC: 0 % (ref 0.0–0.2)

## 2020-07-01 LAB — HEMOGLOBIN AND HEMATOCRIT, BLOOD
HCT: 28.3 % — ABNORMAL LOW (ref 39.0–52.0)
Hemoglobin: 8.3 g/dL — ABNORMAL LOW (ref 13.0–17.0)

## 2020-07-01 LAB — PROTIME-INR
INR: 1.2 (ref 0.8–1.2)
Prothrombin Time: 14.5 seconds (ref 11.4–15.2)

## 2020-07-01 LAB — GLUCOSE, CAPILLARY
Glucose-Capillary: 104 mg/dL — ABNORMAL HIGH (ref 70–99)
Glucose-Capillary: 105 mg/dL — ABNORMAL HIGH (ref 70–99)
Glucose-Capillary: 119 mg/dL — ABNORMAL HIGH (ref 70–99)
Glucose-Capillary: 129 mg/dL — ABNORMAL HIGH (ref 70–99)
Glucose-Capillary: 131 mg/dL — ABNORMAL HIGH (ref 70–99)
Glucose-Capillary: 163 mg/dL — ABNORMAL HIGH (ref 70–99)

## 2020-07-01 LAB — HEPARIN ANTI-XA: Heparin LMW: 1.25 IU/mL

## 2020-07-01 LAB — PREPARE RBC (CROSSMATCH)

## 2020-07-01 MED ORDER — SODIUM CHLORIDE 0.9% IV SOLUTION: Freq: Once | INTRAVENOUS | Status: AC

## 2020-07-01 MED ORDER — MELATONIN 3 MG PO TABS
3.0000 mg | ORAL_TABLET | Freq: Every evening | ORAL | Status: DC | PRN
  Administered 2020-07-01 – 2020-07-10 (×7): 3 mg
  Filled 2020-07-01 (×8): qty 1

## 2020-07-01 MED ORDER — POTASSIUM CHLORIDE 20 MEQ PO PACK
40.0000 meq | PACK | Freq: Once | ORAL | Status: AC
  Administered 2020-07-01: 40 meq
  Filled 2020-07-01: qty 2

## 2020-07-01 MED ORDER — FENTANYL CITRATE (PF) 100 MCG/2ML IJ SOLN
100.0000 ug | INTRAMUSCULAR | Status: DC | PRN
  Administered 2020-07-01 – 2020-07-03 (×10): 100 ug via INTRAVENOUS
  Filled 2020-07-01 (×11): qty 2

## 2020-07-01 MED ORDER — INSULIN DETEMIR 100 UNIT/ML ~~LOC~~ SOLN
40.0000 [IU] | Freq: Two times a day (BID) | SUBCUTANEOUS | Status: DC
  Administered 2020-07-01 – 2020-07-09 (×17): 40 [IU] via SUBCUTANEOUS
  Filled 2020-07-01 (×18): qty 0.4

## 2020-07-01 MED ORDER — WARFARIN SODIUM 7.5 MG PO TABS
15.0000 mg | ORAL_TABLET | Freq: Once | ORAL | Status: AC
  Administered 2020-07-01: 15 mg via ORAL
  Filled 2020-07-01: qty 2

## 2020-07-01 MED ORDER — ENOXAPARIN SODIUM 300 MG/3ML IJ SOLN
225.0000 mg | Freq: Two times a day (BID) | INTRAMUSCULAR | Status: DC
  Administered 2020-07-02 – 2020-07-03 (×4): 225 mg via SUBCUTANEOUS
  Filled 2020-07-01 (×7): qty 2.25

## 2020-07-01 MED ORDER — FENTANYL CITRATE (PF) 100 MCG/2ML IJ SOLN
100.0000 ug | INTRAMUSCULAR | Status: DC | PRN
Start: 2020-07-01 — End: 2020-07-01
  Administered 2020-07-01: 100 ug via INTRAVENOUS
  Filled 2020-07-01: qty 2

## 2020-07-01 NOTE — Progress Notes (Signed)
ANTICOAGULATION CONSULT NOTE  Pharmacy Consult for Heparin Indication: atrial fibrillation  Patient Measurements: Height: 5\' 10"  (177.8 cm) Weight: (!) 258.6 kg (570 lb) IBW/kg (Calculated) : 73 Heparin Dosing Weight: 149.7 kg  Vital Signs: Temp: 98.8 F (37.1 C) (01/31 0800) Temp Source: Esophageal (01/31 0800) Pulse Rate: 100 (01/31 0746)  Labs: Recent Labs    06/29/20 0348 06/29/20 0350 06/29/20 1815 06/30/20 0321 07/01/20 0455  HGB 8.1*  --   --  7.8* 6.7*  HCT 28.3*  --   --  27.4* 22.4*  PLT 188  --   --  169 169  LABPROT  --   --   --   --  14.5  INR  --   --   --   --  1.2  HEPARINUNFRC  --  0.50 0.56 0.63  --   CREATININE 1.75*  --   --  1.69* 1.49*    Estimated Creatinine Clearance: 126.2 mL/min (A) (by C-G formula based on SCr of 1.49 mg/dL (H)).   Assessment: 49YOM admitted with septic shock secondary to necrotizing fascitis, on IV heparin for Afib.   Was on xarelto PTA, but that is not appropriate based on patient's weight  INR 1.2 H&H dropping some, given 2 u PRBC - no bleeding concerns. Has been trending up and down last few days  Goal of Therapy:  Xa lvl 0.5 - 1 INR 2 - 3 Monitor platelets by anticoagulation protocol: Yes   Plan:  Enoxaparin 1 mg/kg q12h - unsure which is accurate weight, will use 600 lb as weight and adjust as needed based on lvls Initial Xa lvl this afternoon after 3rd dose Warfarin 15 mg x 1 Daily INR CBC  07/03/20, PharmD, BCPS, BCCCP Clinical Pharmacist [phone]  Please check AMION for all [facility] Pharmacy numbers  07/01/2020 9:39 AM

## 2020-07-01 NOTE — Progress Notes (Signed)
ANTICOAGULATION CONSULT NOTE  Pharmacy Consult for Enoxaparin Indication: atrial fibrillation  Patient Measurements: Height: 5\' 10"  (177.8 cm) Weight: (!) 258.6 kg (570 lb) IBW/kg (Calculated) : 73 Heparin Dosing Weight: 149.7 kg  Vital Signs: Temp: 98.8 F (37.1 C) (01/31 1151) Temp Source: Esophageal (01/31 1151) BP: 138/70 (01/31 1700) Pulse Rate: 96 (01/31 1512)  Labs: Recent Labs    06/29/20 0348 06/29/20 0350 06/29/20 1815 06/30/20 0321 07/01/20 0455 07/01/20 1415 07/01/20 1620  HGB 8.1*  --   --  7.8* 6.7* 8.3*  --   HCT 28.3*  --   --  27.4* 22.4* 28.3*  --   PLT 188  --   --  169 169  --   --   LABPROT  --   --   --   --  14.5  --   --   INR  --   --   --   --  1.2  --   --   HEPARINUNFRC  --  0.50 0.56 0.63  --   --   --   HEPRLOWMOCWT  --   --   --   --   --   --  1.25  CREATININE 1.75*  --   --  1.69* 1.49*  --   --     Estimated Creatinine Clearance: 126.2 mL/min (A) (by C-G formula based on SCr of 1.49 mg/dL (H)).   Assessment: 49YOM admitted with septic shock secondary to necrotizing fascitis, on IV heparin for Afib.   Was on xarelto PTA, but that is not appropriate based on patient's weight  Lovenox level came back high at 1.25, will decrease dose by 17%  Goal of Therapy:  Xa lvl 0.5 - 1 INR 2 - 3 Monitor platelets by anticoagulation protocol: Yes   Plan:  Enoxaparin 225 mg sub q q12hr - unsure which is accurate weight, will use 600 lb as weight and adjust as needed based on lvls Obtain Xa lvl after 3rd dose of dose change Daily INR CBC  07/03/20, PharmD, [facility] Clinical Pharmacist Please see AMION for all Pharmacists' Contact Phone Numbers 07/01/2020, 6:10 PM

## 2020-07-01 NOTE — Progress Notes (Signed)
eLink Physician-Brief Progress Note Patient Name: Carl Hill DOB: 12-21-1970 MRN: [id number]   Date of Service  07/01/2020  HPI/Events of Note  Request for sleep aid.  eICU Interventions  Plan: 1. Melatonin 3 mg per tube Q HS PRN sleep.      Intervention Category Major Interventions: Other:  [name] 07/01/2020, 8:28 PM

## 2020-07-01 NOTE — Progress Notes (Signed)
NAME:  Carl Hill, MRN:  [id number], DOB:  1970/09/08, LOS: 21 ADMISSION DATE:  06/10/2020, CONSULTATION DATE:  06/10/2020 REFERRING MD:  Lorin Mercy, CHIEF COMPLAINT:  Severe Sepsis    Brief History:  50 year old male who presented with buttock pain, fatigue and malaise.  Found to have hyperglycemia. CT pelvis demonstrated necrotizing fasciitis in L buttock, L upper thigh, and perineum. Taken to OR for debridement 1/10 c/b Afib with RVR and VT arrest s/p CPR/defib. Trached 1/26, remains on full vent support.  Past Medical History:  HTN, obesity, DM, AFib on DOAC  Significant Hospital Events:  1/10  Admitted to [facility]. General surgery consulted and debridement pursued. In the afternoon, patient developed hypotension with AF in RVR. Synchronized cardioversion unsuccessful with conversion to VT. CPR initated with ROSC after desynchronized defibrillation. 1/11 Debridement 1/18 Mucus plugging 1/19 Bronchoscopy >> mucus plug on Rt, airway collapse with exhalation 1/20 Persistent fever, change ABx 1/24 Increased vent requirements, habitus related +/- mucus plugging 1/25 Hypotensive to SBP 80s, Levo resumed, minimally responsive overnight off sedation, CT Head negative 1/26 Stable vent/pressor requirements, bronched, tracheostomy 1/29 start hydrotherapy 1/30 start lovenox/coumadin  Consults:  General Surgery ID s/o 1/25  Procedures:  ETT 1/10 >>  R IJ CVL 1/10 >>  L brachial A-line 1/10 >>   Significant Diagnostic Tests:   CT abd/pelvis 1/10 >> Extensive subcutaneous emphysema in the medial aspect of the upper thighs extending through perineum and superiorly to the medial left buttock. Gas extends into the pelvis surrounding the anus inferiorly and tracking superiorly along the left wall of the rectum.  CT C/A/P 1/22 >> Imaging limited due to body habitus. No gross fluid collection or gas seen. Bilateral consolidation with dependent atelectasis  CT Head 1/25 >> No acute intracranial  abnormality  Micro Data:  COVID/Flu 1/10 >> negative MRSA PCR 1/10 >> negative L buttocks wound 1/10 >> E. Coli (pansensitive), Streptococcus infantarious (sensitive to PCN, ceftriaxone, vanc), Peptostreptococcus species, rare GBS Blood 1/10 >> negative BAL 1/20 >> normal flora Resp Cx 1/26 > Normal flora  Antimicrobials:  Aztreonam 1/09 Metronidazole 1/09 Vancomycin 1/09, 1/11, 1/20 >>1/22 Cefepime 1/10 >> 1/13, 1/20 >>1/22 Clindamycin 1/10 >> 1/13 Ceftriaxone 1/13 >> 1/20 Metronidazole 1/17 >> 1/22 Meropenem 1/22 >> 1/25 Amoxicillin 1/24 (x 1 for PCN Allergy challenge) Unasyn 1/25 >> 1/27 Augmentin 1/27 >>  Interim History / Subjective:  Remains on FIO2 60% and PEEP 12. Patient awake alert. Discussed GOC at bedside with mother present. Patient states he has been depressed and did not mean his earlier comments regarding withdrawing care. On my assessment, he is appropriate and able to clearly express that he wishes to continue current care and interested in making himself better.  Has back pain and discomfort that is improved with fentanyl.   Objective   Blood pressure (!) 143/74, pulse 96, temperature 99.1 F (37.3 C), resp. rate (!) 21, height _0  (1.778 m), weight (!) 258.6 kg, SpO2 94 %.    Vent Mode: PCV FiO2 (%):  [60 %-70 %] 60 % Set Rate:  [18 bmp] 18 bmp PEEP:  [12 cmH20] 12 cmH20 Plateau Pressure:  [26 cmH20-34 cmH20] 26 cmH20   Intake/Output Summary (Last 24 hours) at 07/01/2020 1147 Last data filed at 07/01/2020 1046 Gross per 24 hour  Intake 1605.91 ml  Output 6800 ml  Net -5194.09 ml   Filed Weights   06/24/20 0319 06/25/20 0500 06/30/20 0500  Weight: (!) 274 kg (!) 277.1 kg (!) 258.6 kg  Physical Exam: General: Morbidly obese, no acute distress HENT: [place], AT, OP clear, MMM Neck: Trach in place, c/d/i Eyes: EOMI, no scleral icterus Respiratory: Diminished breath sounds bilaterally.  No crackles, wheezing or rales Cardiovascular: RRR, -M/R/G, no  JVD GI: BS+, soft, nontender Extremities:-Edema,-tenderness Neuro: AAO x4, CNII-XII grossly intact, interactive and follows commands GU: Foley in place  Resolved problems:  VT arrest 1/10, Ileus 1/15, Elevated LFTs from shock, Metabolic acidosis with lactic acidosis, Septic shock  Assessment & Plan:   Sepsis from Lt buttock necrotizing fasciitis with E coli, Group B Streptococcus, and Peptostreptococcus in wound culture. Bacterial HCAP. - started on hydrotherapy 1/29 - surgery following for wound care - day 22 of antibiotics, currently on augmentin  Acute on chronic hypoxic/hypercapnic respiratory failure in setting of sepsis, HCAP, recurrent mucus plugging. 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 Bronchomalacia. Probable sleep disordered breathing. - Wean FiO2  to keep SpO2 90 to 95% - keep PEEP at 12 cm H2O - using pressure control mode of ventilation - continue scheduled albuterol and mucomyst for now - f/u CXR - would like to defer repeated bronchoscopy if able - trach care, bronchial hygiene - will eventually need LTAC if he has insurance benefits for this  Hypernatremia from diuresis and insensible losses. - improved on 1/30 - continue to hold lasix - continue free water - f/u BMET  Acute blood loss anemia, likely from frequent blood draws - Transfuse 2 U PRBC - Trend CBC post-transfusion  New on set A fib with RVR >> back in sinus rhythm. Hx of HLD. - continue amiodarone, lipitor - Continue lovenox/coumadin per pharmacy. Started on 1/30  DM type 2 poorly controlled with hyperglycemia - SSI with tube feed coverage and levemir  Hypothyroidism - Continue Synthroid  Acute metabolic encephalopathy 2nd to sepsis, hypoxia, hypercapnia. 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 - off sedation infusions since 06/25/20; suspect he will need prolonged time for clearance due to obesity - RASS goal 0 to -1 - Continue Lyrica/nortriptyline  Anemia of critical illness. -  f/u CBC - transfuse for Hb < 7 or significant bleeding  Best practice (evaluated daily)  Diet: Continue TF DVT prophylaxis: lovenox/coumadin GI prophylaxis: Protonix  Mobility: Bedrest Disposition: ICU Family: Updated pt's mother at bedside 1/31 Code status: Full Code  Labs    CMP Latest Ref Rng & Units 07/01/2020 06/30/2020 06/29/2020  Glucose 70 - 99 mg/dL 141(H) 136(H) 147(H)  BUN 6 - 20 mg/dL 47(H) 53(H) 54(H)  Creatinine 0.61 - 1.24 mg/dL 1.49(H) 1.69(H) 1.75(H)  Sodium 135 - 145 mmol/L 147(H) 149(H) 152(H)  Potassium 3.5 - 5.1 mmol/L 3.5 3.7 3.7  Chloride 98 - 111 mmol/L 102 105 105  CO2 22 - 32 mmol/L 32 35(H) 34(H)  Calcium 8.9 - 10.3 mg/dL 8.4(L) 8.1(L) 8.4(L)  Total Protein 6.5 - 8.1 g/dL - - -  Total Bilirubin 0.3 - 1.2 mg/dL - - -  Alkaline Phos 38 - 126 U/L - - -  AST 15 - 41 U/L - - -  ALT 0 - 44 U/L - - -    CBC Latest Ref Rng & Units 07/01/2020 06/30/2020 06/29/2020  WBC 4.0 - 10.5 K/uL 10.6(H) 10.1 9.2  Hemoglobin 13.0 - 17.0 g/dL 6.7(LL) 7.8(L) 8.1(L)  Hematocrit 39.0 - 52.0 % 22.4(L) 27.4(L) 28.3(L)  Platelets 150 - 400 K/uL 169 169 188   ABG    Component Value Date/Time   PHART 7.401 06/23/2020 1204   PCO2ART 57.4 (H) 06/23/2020 1204   PO2ART 73 (  L) 06/23/2020 1204   HCO3 35.5 (H) 06/23/2020 1204   TCO2 37 (H) 06/23/2020 1204   ACIDBASEDEF 2.0 06/13/2020 1221   O2SAT 94.0 06/23/2020 1204    CBG (last 3)  Recent Labs    07/01/20 0008 07/01/20 0338 07/01/20 0743  GLUCAP 104* 129* 119*   Signature:    The patient is critically ill with multiple organ systems failure and requires high complexity decision making for assessment and support, frequent evaluation and titration of therapies, application of advanced monitoring technologies and extensive interpretation of multiple databases.  Independent Critical Care Time: 35 Minutes.   Rodman Pickle, M.D. [facility] Pulmonary/Critical Care Medicine 07/01/2020 11:47 AM   Please see Amion for pager number  to reach on-call Pulmonary and Critical Care Team.

## 2020-07-01 NOTE — Progress Notes (Signed)
eLink Physician-Brief Progress Note Patient Name: Carl Hill DOB: Jul 27, 1970 MRN: [id number]   Date of Service  07/01/2020  HPI/Events of Note  Midnight blood glucose = 104. Patient on TF at 70 mL/hour, Lantus 40 units Q12, Q 4 Novolog SSI and TF Novolog SSI.  eICU Interventions  Plan: 1. D/C Q 4 hour 17 units Novolog SSI TF coverage.      Intervention Category Major Interventions: Other:  Carl Hill 07/01/2020, 12:55 AM

## 2020-07-01 NOTE — Progress Notes (Signed)
eLink Physician-Brief Progress Note Patient Name: Carl Hill DOB: Mar 30, 1971 MRN: [id number]   Date of Service  07/01/2020  HPI/Events of Note  Anemia - Hgb = 6.7.   eICU Interventions  Transfuse 1 unit PRBC now.      Intervention Category Major Interventions: Other:  [name] 07/01/2020, 5:31 AM

## 2020-07-01 NOTE — Progress Notes (Incomplete)
20 Days Post-Op    CC:  Subjective: ***  Objective: Vital signs in last 24 hours: Temp:  [98.1 F (36.7 C)-99.8 F (37.7 C)] 99.1 F (37.3 C) (01/31 0959) Pulse Rate:  [94-100] 96 (01/31 0959) Resp:  [15-27] 21 (01/31 0959) BP: (126-146)/(65-75) 143/74 (01/31 0959) SpO2:  [92 %-100 %] 94 % (01/31 0800) Arterial Line BP: (108-147)/(54-78) 111/54 (01/31 0800) FiO2 (%):  [60 %-70 %] 60 % (01/31 0746) Last BM Date: 06/30/20 584 IV 1030 NG tube 5600 urine Stool 1200 Afebrile, vital signs are stable, still on the ventilator with FiO2 60%. Creatinine 1.49 WBC 10.6 H/H 6.7/22.4 CXR shows shifting atelectasis improved right upper lobe but increased the right base when compared to yesterday's film.  Intake/Output from previous day: 01/30 0701 - 01/31 0700 In: 1614.3 [I.V.:584.3; NG/GT:1030] Out: 6800 [Urine:5600; Stool:1200] Intake/Output this shift: Total I/O In: 410 [NG/GT:410] Out: 800 [Urine:800]  {physical exam:3041130}  Lab Results:  Recent Labs    06/30/20 0321 07/01/20 0455  WBC 10.1 10.6*  HGB 7.8* 6.7*  HCT 27.4* 22.4*  PLT 169 169    BMET Recent Labs    06/30/20 0321 07/01/20 0455  NA 149* 147*  K 3.7 3.5  CL 105 102  CO2 35* 32  GLUCOSE 136* 141*  BUN 53* 47*  CREATININE 1.69* 1.49*  CALCIUM 8.1* 8.4*   PT/INR Recent Labs    07/01/20 0455  LABPROT 14.5  INR 1.2    Recent Labs  Lab 06/24/20 1434 06/26/20 0100  AST  --  27  ALT  --  25  ALKPHOS  --  44  BILITOT  --  0.7  PROT  --  5.8*  ALBUMIN 1.6* 1.5*     Lipase  No results found for: LIPASE   Medications: . acetylcysteine  4 mL Nebulization BID  . albuterol  2.5 mg Nebulization BID  . amiodarone  200 mg Per Tube Daily  . amoxicillin-clavulanate  1 tablet Per Tube Q8H  . atorvastatin  20 mg Per Tube QPM  . chlorhexidine gluconate (MEDLINE KIT)  15 mL Mouth Rinse BID  . Chlorhexidine Gluconate Cloth  6 each Topical Daily  . collagenase  1 application Topical Daily  .  docusate  100 mg Per Tube BID  . enoxaparin (LOVENOX) injection  1 mg/kg (Order-Specific) Subcutaneous Q12H  . feeding supplement (PROSource TF)  90 mL Per Tube QID  . free water  400 mL Per Tube Q4H  . insulin aspart  0-20 Units Subcutaneous Q4H  . insulin detemir  40 Units Subcutaneous BID  . levothyroxine  75 mcg Per Tube Daily  . mouth rinse  15 mL Mouth Rinse 10 times per day  . nortriptyline  25 mg Per Tube BID  . pantoprazole sodium  40 mg Per Tube Q24H  . polyethylene glycol  17 g Per Tube BID  . pregabalin  300 mg Per Tube BID  . sennosides  10 mL Per Tube BID  . sodium chloride flush  10-40 mL Intracatheter Q12H  . sodium chloride flush  3 mL Intravenous Q12H  . warfarin  15 mg Oral ONCE-1600  . Warfarin - Pharmacist Dosing Inpatient   Does not apply q1600    Assessment/Plan A. Fib VT Arrest (1/10) VDRF  -Still on ventilator DM2 Hypothyroidism Hx HTN Anemia  50 yo male with septic shock secondary to perianal/perineal necrotizing soft tissue infection-s/p operative debridement x2(1/10 and 1/11) - Wound examined at bedside today. No concerns for worsening infection. Small  area of soft necrotic tissue debrided from distal aspect of wound today. Underlying tissuewith some fat necrosis but no operative debridement needed.  - Continue daily saline wet-to-dry dressings.Continue rectal tube and monitor output, if decreases may need to be manually disimpacted again - Will ask PT to see for hydrotherapy -Antibiotics per ID - currently on augmentin - Appreciate CCM's assistance in management of patient shock and MMP - We will see again next week. Call sooner with any concerns.  - PATIENT IS NOT A CANDIDATE FOR COLOSTOMY SECONDARY TO BODY HABITUS - VTE: on heparin gtt for a-fib     ***   LOS: 21 days    Ahlana Slaydon 07/01/2020 Please see Amion

## 2020-07-01 NOTE — Progress Notes (Signed)
eLink Physician-Brief Progress Note Patient Name: Carl Hill DOB: [birth year] MRN: [id number]   Date of Service  07/01/2020  HPI/Events of Note  Fentanyl pain relief lasts about an hour.   eICU Interventions  Plan: 1 Increase to Fentanyl IV Q 1 hour PRN pain.      Intervention Category Major Interventions: Other:  [name] 07/01/2020, 10:07 PM

## 2020-07-01 NOTE — Progress Notes (Signed)
20 Days Post-Op  Subjective: CC: Seen with hydro. Having stool from rectal pouch.   Objective: Vital signs in last 24 hours: Temp:  [98.1 F (36.7 C)-99.8 F (37.7 C)] 98.8 F (37.1 C) (01/31 1151) Pulse Rate:  [94-100] 95 (01/31 1151) Resp:  [13-27] 13 (01/31 1200) BP: (127-146)/(65-75) 138/65 (01/31 1151) SpO2:  [90 %-100 %] 90 % (01/31 1200) Arterial Line BP: (108-147)/(54-78) 126/78 (01/31 1200) FiO2 (%):  [60 %] 60 % (01/31 0746) Last BM Date: 06/30/20  Intake/Output from previous day: 01/30 0701 - 01/31 0700 In: 1614.3 [I.V.:584.3; NG/GT:1030] Out: 6800 [Urine:5600; Stool:1200] Intake/Output this shift: Total I/O In: 550 [NG/GT:550] Out: 800 [Urine:800]   Vent Mode: PCV FiO2 (%):  [60 %] 60 % Set Rate:  [18 bmp] 18 bmp PEEP:  [12 cmH20] 12 cmH20 Plateau Pressure:  [26 cmH20-34 cmH20] 26 cmH20  PE: Gen: Trached Lungs: On vent Perirectal/perineal wound:Wound bed beefy granulation tissue as noted in the picture below. Wound drainage ismostlyserosanguinous. There is no surrounding blanching erythema.      Lab Results:  Recent Labs    06/30/20 0321 07/01/20 0455  WBC 10.1 10.6*  HGB 7.8* 6.7*  HCT 27.4* 22.4*  PLT 169 169   BMET Recent Labs    06/30/20 0321 07/01/20 0455  NA 149* 147*  K 3.7 3.5  CL 105 102  CO2 35* 32  GLUCOSE 136* 141*  BUN 53* 47*  CREATININE 1.69* 1.49*  CALCIUM 8.1* 8.4*   PT/INR Recent Labs    07/01/20 0455  LABPROT 14.5  INR 1.2   CMP     Component Value Date/Time   NA 147 (H) 07/01/2020 0455   K 3.5 07/01/2020 0455   CL 102 07/01/2020 0455   CO2 32 07/01/2020 0455   GLUCOSE 141 (H) 07/01/2020 0455   BUN 47 (H) 07/01/2020 0455   CREATININE 1.49 (H) 07/01/2020 0455   CALCIUM 8.4 (L) 07/01/2020 0455   PROT 5.8 (L) 06/26/2020 0100   ALBUMIN 1.5 (L) 06/26/2020 0100   AST 27 06/26/2020 0100   ALT 25 06/26/2020 0100   ALKPHOS 44 06/26/2020 0100   BILITOT 0.7 06/26/2020 0100   GFRNONAA 58 (L)  07/01/2020 0455   Lipase  No results found for: LIPASE     Studies/Results: DG Chest Port 1 View  Result Date: 07/01/2020 CLINICAL DATA:  Mucous plugging EXAM: PORTABLE CHEST 1 VIEW COMPARISON:  Yesterday FINDINGS: Continued improvement in right upper lobe aeration. Increased hazy right lower chest opacification with less well defined diaphragm. Similar increased hazy left mid and lower chest opacification. Probable layering pleural effusion on the left at least. Pleural fluid was seen on most recent chest CT. No visible pneumothorax. Extensive artifact from support apparatus. Central line and tracheostomy/feeding tubes without detectable change. Limited by body habitus. IMPRESSION: 1. Shifting atelectasis, improved in the right upper lobe but increased at the right base when compared to yesterday. 2. Visible hardware in unchanged position. Electronically Signed   By: Marnee Spring M.D.   On: 07/01/2020 05:37   DG Chest Port 1 View  Result Date: 06/30/2020 CLINICAL DATA:  Mucous plugging of bronchi EXAM: PORTABLE CHEST 1 VIEW COMPARISON:  Two days ago FINDINGS: Slight improvement in right upper lobe aeration, still near complete collapse. Dense retrocardiac opacity persists but there is improved left upper lobe aeration. Extensive artifact from overlapping thermal blanket. Tracheostomy tube in place. The feeding tube at least reaches the lower mediastinum. Right IJ line in stable position. IMPRESSION: 1.  Right upper lobe collapse with mildly improved aeration from 2 days ago. Improved left upper lobe aeration. 2. Persistent dense retrocardiac opacity where there was atelectasis and pleural fluid on most recent chest CT. Electronically Signed   By: Marnee Spring M.D.   On: 06/30/2020 07:15    Anti-infectives: Anti-infectives (From admission, onward)   Start     Dose/Rate Route Frequency Ordered Stop   06/27/20 1400  amoxicillin-clavulanate (AUGMENTIN) 875-125 MG per tablet 1 tablet        1  tablet Per Tube Every 8 hours 06/27/20 0939     06/24/20 1615  Ampicillin-Sulbactam (UNASYN) 3 g in sodium chloride 0.9 % 100 mL IVPB  Status:  Discontinued        3 g 200 mL/hr over 30 Minutes Intravenous Every 6 hours 06/24/20 1517 06/27/20 0939   06/24/20 1300  amoxicillin (AMOXIL) capsule 500 mg        500 mg Oral  Once 06/24/20 1241 06/24/20 1256   06/24/20 1115  amoxicillin (AMOXIL) capsule 500 mg  Status:  Discontinued        500 mg Oral  Once 06/24/20 1025 06/24/20 1241   06/22/20 1415  meropenem (MERREM) 2 g in sodium chloride 0.9 % 100 mL IVPB  Status:  Discontinued        2 g 200 mL/hr over 30 Minutes Intravenous Every 8 hours 06/22/20 1324 06/24/20 1517   06/22/20 1400  meropenem (MERREM) 1 g in sodium chloride 0.9 % 100 mL IVPB  Status:  Discontinued        1 g 200 mL/hr over 30 Minutes Intravenous Every 8 hours 06/22/20 1248 06/22/20 1324   06/22/20 1300  doxycycline (VIBRAMYCIN) 100 mg in sodium chloride 0.9 % 250 mL IVPB  Status:  Discontinued        100 mg 125 mL/hr over 120 Minutes Intravenous Every 12 hours 06/22/20 1212 06/22/20 1248   06/20/20 1100  vancomycin (VANCOCIN) 2,500 mg in sodium chloride 0.9 % 500 mL IVPB  Status:  Discontinued        2,500 mg 250 mL/hr over 120 Minutes Intravenous Every 12 hours 06/20/20 1007 06/22/20 0813   06/20/20 1100  ceFEPIme (MAXIPIME) 2 g in sodium chloride 0.9 % 100 mL IVPB  Status:  Discontinued        2 g 200 mL/hr over 30 Minutes Intravenous Every 8 hours 06/20/20 1007 06/22/20 1207   06/17/20 1130  metroNIDAZOLE (FLAGYL) IVPB 500 mg  Status:  Discontinued        500 mg 100 mL/hr over 60 Minutes Intravenous Every 8 hours 06/17/20 1041 06/22/20 1248   06/13/20 1400  cefTRIAXone (ROCEPHIN) 2 g in sodium chloride 0.9 % 100 mL IVPB  Status:  Discontinued        2 g 200 mL/hr over 30 Minutes Intravenous Every 24 hours 06/13/20 1242 06/20/20 0944   06/11/20 1300  vancomycin (VANCOREADY) IVPB 2000 mg/400 mL        2,000 mg 200  mL/hr over 120 Minutes Intravenous  Once 06/11/20 1212 06/11/20 1824   06/10/20 2000  clindamycin (CLEOCIN) IVPB 900 mg        900 mg 100 mL/hr over 30 Minutes Intravenous Every 8 hours 06/10/20 1307 06/13/20 1500   06/10/20 1600  ceFEPIme (MAXIPIME) 2 g in sodium chloride 0.9 % 100 mL IVPB  Status:  Discontinued        2 g 200 mL/hr over 30 Minutes Intravenous Every 12 hours 06/10/20 1504 06/13/20  1242   06/10/20 1400  metroNIDAZOLE (FLAGYL) IVPB 500 mg  Status:  Discontinued        500 mg 100 mL/hr over 60 Minutes Intravenous Every 8 hours 06/10/20 1023 06/10/20 1308   06/10/20 1300  aztreonam (AZACTAM) 2 g in sodium chloride 0.9 % 100 mL IVPB  Status:  Discontinued        2 g 200 mL/hr over 30 Minutes Intravenous Every 8 hours 06/10/20 1033 06/10/20 1504   06/10/20 1215  clindamycin (CLEOCIN) IVPB 900 mg        900 mg 100 mL/hr over 30 Minutes Intravenous To Surgery 06/10/20 1210 06/10/20 1210   06/10/20 1033  vancomycin variable dose per unstable renal function (pharmacist dosing)  Status:  Discontinued         Does not apply See admin instructions 06/10/20 1033 06/12/20 1252   06/10/20 0600  metroNIDAZOLE (FLAGYL) tablet 500 mg  Status:  Discontinued        500 mg Oral Every 8 hours 06/10/20 0406 06/10/20 0948   06/10/20 0415  aztreonam (AZACTAM) 2 g in sodium chloride 0.9 % 100 mL IVPB        2 g 200 mL/hr over 30 Minutes Intravenous  Once 06/10/20 0406 06/10/20 0546   06/10/20 0415  vancomycin (VANCOCIN) IVPB 1000 mg/200 mL premix  Status:  Discontinued        1,000 mg 200 mL/hr over 60 Minutes Intravenous  Once 06/10/20 0406 06/10/20 0412   06/10/20 0415  vancomycin (VANCOCIN) 2,500 mg in sodium chloride 0.9 % 500 mL IVPB        2,500 mg 250 mL/hr over 120 Minutes Intravenous  Once 06/10/20 0412 06/10/20 0903       Assessment/Plan A. Fib VT Arrest (1/10) DM2 Hypothyroidism Hx HTN S/p trach   50 yo male with septic shock secondary to perianal/perineal necrotizing soft  tissue infection-s/p operative debridement x2(1/10 and 1/11) - Continue daily saline wet-to-dry dressings and hydro - No indication for further debridement  -Antibiotics per ID - currently on augmentin - Appreciate CCM's assistance in management of patient - As mentioned previously, the patient is not a candidate for colostomy 2/2 body habitus.   - Our team will sign off.    LOS: 21 days    Carl Hill , [facility] 07/01/2020, 12:30 PM Please see Amion for pager number during day hours 7:00am-4:30pm

## 2020-07-01 NOTE — Progress Notes (Signed)
Patient's mother Carl Hill, contacted for type and screen. Updated on treatment plan. Consent given. Witnessed by Luvenia Heller, Rn.  Patient's mother requested patient be woken up despite patient falling asleep after fentanyl administration. Patient's mother educated on importance of letting patient rest following turns and wound care and pain.  Patient's mother remained upset despite being educated that patient would be given message and was in no acute distress at time.

## 2020-07-01 NOTE — Progress Notes (Signed)
Physical Therapy Wound Treatment Patient Details  Name: Carl Hill MRN: [id number] Date of Birth: 07/08/1970  Today's Date: 07/01/2020 Time: 1147-1239 Time Calculation (min): 52 min  Subjective  Subjective: Pt giving PT a fist bump upon entrance Patient and Family Stated Goals: for wound to heal Prior Treatments: Debridement 06/11/2020  Pain Score: 2/10  Wound Assessment  Wound / Incision (Open or Dehisced) 06/29/20 Buttocks (Active)  Dressing Type ABD;Barrier Film (skin prep);Gauze (Comment) 07/01/20 1655  Dressing Changed Changed 07/01/20 1655  Dressing Status Clean;Dry;Intact 07/01/20 1655  Dressing Change Frequency Daily 07/01/20 1655  Site / Wound Assessment Yellow;Bleeding;Black;Red 07/01/20 1655  % Wound base Red or Granulating 60% 06/29/20 1453  % Wound base Yellow/Fibrinous Exudate 30% 06/29/20 1453  % Wound base Black/Eschar 10% 06/29/20 1453  % Wound base Other/Granulation Tissue (Comment) 0% 06/29/20 1453  Peri-wound Assessment Intact 07/01/20 1655  Wound Length (cm) 25 cm 06/29/20 1453  Wound Width (cm) 11 cm 06/29/20 1453  Wound Depth (cm) 10 cm 06/29/20 1453  Wound Volume (cm^3) 2750 cm^3 06/29/20 1453  Wound Surface Area (cm^2) 275 cm^2 06/29/20 1453  Margins Unattached edges (unapproximated) 07/01/20 1655  Closure None 07/01/20 1200  Drainage Amount Copious 07/01/20 1655  Drainage Description Sanguineous 07/01/20 1655  Treatment Debridement (Selective);Hydrotherapy (Pulse lavage);Packing (Saline gauze) 07/01/20 1655   Santyl applied to wound bed prior to applying dressing.   Hydrotherapy Pulsed lavage therapy - wound location: buttocks Pulsed Lavage with Suction (psi): 12 psi Pulsed Lavage with Suction - Normal Saline Used: 2000 mL Pulsed Lavage Tip: Tip with splash shield Selective Debridement Selective Debridement - Location: buttocks Selective Debridement - Tools Used: Forceps;Scalpel Selective Debridement - Tissue Removed: yellow slough, eschar    Wound Assessment and Plan  Wound Therapy - Assess/Plan/Recommendations Wound Therapy - Clinical Statement: Wound progressing with hydrotherapy with increased granulation tissue. Pt will benefit from selective debridement and pulsatile lavage in order to cleanse wound and decrease bioburden. Wound Therapy - Functional Problem List: decreased mobility, obesity Factors Delaying/Impairing Wound Healing: Immobility;Diabetes Mellitus Hydrotherapy Plan: Debridement;Patient/family education;Pulsatile lavage with suction Wound Therapy - Frequency: 6X / week Wound Therapy - Follow Up Recommendations: Other (comment) (LTACH) Wound Plan: see above  Wound Therapy Goals- Improve the function of patient's integumentary system by progressing the wound(s) through the phases of wound healing (inflammation - proliferation - remodeling) by: Decrease Necrotic Tissue to: 20 Decrease Necrotic Tissue - Progress: Progressing toward goal Increase Granulation Tissue to: 80 Increase Granulation Tissue - Progress: Progressing toward goal Goals/treatment plan/discharge plan were made with and agreed upon by patient/family: Yes Time For Goal Achievement: 7 days Wound Therapy - Potential for Goals: Fair  Goals will be updated until maximal potential achieved or discharge criteria met.  Discharge criteria: when goals achieved, discharge from hospital, MD decision/surgical intervention, no progress towards goals, refusal/missing three consecutive treatments without notification or medical reason.  GP  Caroline Brown, PT, DPT Acute Rehabilitation Services Pager [phone] Office [phone]      Carloine T Brown 07/01/2020, 4:58 PM   

## 2020-07-02 DIAGNOSIS — N493 Fournier gangrene: Secondary | ICD-10-CM | POA: Diagnosis not present

## 2020-07-02 LAB — BASIC METABOLIC PANEL
Anion gap: 12 (ref 5–15)
BUN: 42 mg/dL — ABNORMAL HIGH (ref 6–20)
CO2: 29 mmol/L (ref 22–32)
Calcium: 8.4 mg/dL — ABNORMAL LOW (ref 8.9–10.3)
Chloride: 103 mmol/L (ref 98–111)
Creatinine, Ser: 1.36 mg/dL — ABNORMAL HIGH (ref 0.61–1.24)
GFR, Estimated: >60 mL/min (ref >60)
Glucose, Bld: 177 mg/dL — ABNORMAL HIGH (ref 70–99)
Potassium: 3.5 mmol/L (ref 3.5–5.1)
Sodium: 144 mmol/L (ref 135–145)

## 2020-07-02 LAB — MAGNESIUM: Magnesium: 2 mg/dL (ref 1.7–2.4)

## 2020-07-02 LAB — PROTIME-INR
INR: 1.3 — ABNORMAL HIGH (ref 0.8–1.2)
INR: 1.3 — ABNORMAL HIGH (ref 0.8–1.2)
Prothrombin Time: 15.4 seconds — ABNORMAL HIGH (ref 11.4–15.2)
Prothrombin Time: 16 seconds — ABNORMAL HIGH (ref 11.4–15.2)

## 2020-07-02 LAB — CBC
HCT: 27.8 % — ABNORMAL LOW (ref 39.0–52.0)
Hemoglobin: 8.5 g/dL — ABNORMAL LOW (ref 13.0–17.0)
MCH: 28.6 pg (ref 26.0–34.0)
MCHC: 30.6 g/dL (ref 30.0–36.0)
MCV: 93.6 fL (ref 80.0–100.0)
Platelets: 174 10*3/uL (ref 150–400)
RBC: 2.97 MIL/uL — ABNORMAL LOW (ref 4.22–5.81)
RDW: 18.1 % — ABNORMAL HIGH (ref 11.5–15.5)
WBC: 9 10*3/uL (ref 4.0–10.5)
nRBC: 0 % (ref 0.0–0.2)

## 2020-07-02 LAB — GLUCOSE, CAPILLARY
Glucose-Capillary: 153 mg/dL — ABNORMAL HIGH (ref 70–99)
Glucose-Capillary: 160 mg/dL — ABNORMAL HIGH (ref 70–99)
Glucose-Capillary: 161 mg/dL — ABNORMAL HIGH (ref 70–99)
Glucose-Capillary: 161 mg/dL — ABNORMAL HIGH (ref 70–99)
Glucose-Capillary: 170 mg/dL — ABNORMAL HIGH (ref 70–99)
Glucose-Capillary: 171 mg/dL — ABNORMAL HIGH (ref 70–99)
Glucose-Capillary: 210 mg/dL — ABNORMAL HIGH (ref 70–99)

## 2020-07-02 LAB — HEMOGLOBIN AND HEMATOCRIT, BLOOD
HCT: 28.4 % — ABNORMAL LOW (ref 39.0–52.0)
Hemoglobin: 8.3 g/dL — ABNORMAL LOW (ref 13.0–17.0)

## 2020-07-02 MED ORDER — POTASSIUM CHLORIDE 20 MEQ PO PACK
40.0000 meq | PACK | Freq: Once | ORAL | Status: AC
  Administered 2020-07-02: 40 meq
  Filled 2020-07-02: qty 2

## 2020-07-02 MED ORDER — "THROMBI-PAD 3""X3"" EX PADS"
1.0000 | MEDICATED_PAD | Freq: Once | CUTANEOUS | Status: DC
  Filled 2020-07-02: qty 1

## 2020-07-02 MED ORDER — TRANEXAMIC ACID 1000 MG/10ML IV SOLN
2000.0000 mg | Freq: Once | INTRAVENOUS | Status: AC
  Administered 2020-07-02: 2000 mg via TOPICAL
  Filled 2020-07-02: qty 20

## 2020-07-02 MED ORDER — FREE WATER
200.0000 mL | Freq: Three times a day (TID) | Status: DC
  Administered 2020-07-02 – 2020-07-08 (×18): 200 mL

## 2020-07-02 MED ORDER — WARFARIN SODIUM 7.5 MG PO TABS
17.5000 mg | ORAL_TABLET | Freq: Once | ORAL | Status: AC
  Administered 2020-07-02: 17.5 mg via ORAL
  Filled 2020-07-02: qty 1

## 2020-07-02 NOTE — TOC Initial Note (Signed)
Transition of Care [facility]) - Initial/Assessment Note    Patient Details  Name: Carl Hill MRN: [id number] Date of Birth: 10/23/1970  [facility]) CM/SW Contact:    Lawerance Sabal, RN Phone Number: 07/02/2020, 3:57 PM  Clinical Narrative:                 TOC following for progression.  Barriers to DC include but are not limited to: Extent of wound, it's location, and treatment currently requiring hydrotherapy Mobility as limited by wound and pain, including mobility to rest in upright seated position Tracheostomy, patient may start TC trial this week Obesity, weight approx 580 pounds Insurance does not cover [facility]   Expected Discharge Plan: Skilled Nursing Facility Barriers to Discharge: Inadequate or no insurance,Other (comment)   Patient Goals and CMS Choice        Expected Discharge Plan and Services Expected Discharge Plan: Skilled Nursing Facility                                              Prior Living Arrangements/Services                       Activities of Daily Living      Permission Sought/Granted                  Emotional Assessment              Admission diagnosis:  Weakness [R53.1] Hyperglycemia [R73.9] AKI (acute kidney injury) (HCC) [N17.9] Sepsis (HCC) [A41.9] Sepsis, due to unspecified organism, unspecified whether acute organ dysfunction present [facility]) [A41.9] Patient Active Problem List   Diagnosis Date Noted  . Sepsis due to Streptococcus, group B (HCC) 06/22/2020  . 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 Acute respiratory failure (HCC)   . Severe sepsis with septic shock (HCC) 06/10/2020  . Fournier gangrene 06/10/2020  . DKA (diabetic ketoacidosis) (HCC) 06/10/2020  . AKI (acute kidney injury) (HCC) 06/10/2020  . Diabetic ulcer of heel (HCC) 06/10/2020  . Atrial fibrillation, chronic (HCC) 06/10/2020  . Essential hypertension 06/10/2020  . Dyslipidemia 06/10/2020  . Acquired hypothyroidism  06/10/2020  . Chronic pain disorder 06/10/2020  . Morbid obesity with BMI of 60.0-69.9, adult (HCC) 06/10/2020   PCP:  Malka So., MD Pharmacy:   [facility] [address] [place] - [address] [facility] [place] [address] Phone: [phone] Fax: [phone]     Social Determinants of Health (SDOH) Interventions    Readmission Risk Interventions No flowsheet data found.

## 2020-07-02 NOTE — Evaluation (Signed)
Occupational Therapy Evaluation Patient Details Name: Carl Hill MRN: [id number] DOB: 06/19/1970 Today's Date: 07/02/2020    History of Present Illness Pt is a 50 year old male who presented with buttock pain, fatigue and malaise.  Found to have hyperglycemia. CT pelvis demonstrated necrotizing fasciitis in L buttock, L upper thigh, and perineum. Taken to OR for debridement 1/10 c/b Afib with RVR and VT arrest s/p CPR/defib. Trached 1/26, remains on full vent support. PMHx: HTN, obesity, DM, AFib   Clinical Impression   Pt PTA: pt living with his mother and required assist for mobility and ADL due to increased body habitus and NWB on L heel. Pt currently, maxA +2-3 for rolling side to side and the 4th staff member assist for peri care. Pt minA to totalA for ADL tasks. Pt limited by decreased strength, decreased ability to care for self and decreased activity tolerance. Pt would benefit from continued OT skilled services. OT following acutely. Follow for progress for disposition.    Follow Up Recommendations  SNF;Supervision/Assistance - 24 hour    Equipment Recommendations  Wheelchair (measurements OT);Wheelchair cushion (measurements OT);Hospital bed;3 in 1 bedside commode    Recommendations for Other Services       Precautions / Restrictions Precautions Precautions: Fall;Other (comment) Precaution Comments: trach on vent, foley, rectal pouch Restrictions Weight Bearing Restrictions: No      Mobility Bed Mobility Overal bed mobility: Needs Assistance Bed Mobility: Rolling Rolling: Max assist;+2 for physical assistance;+2 for safety/equipment         General bed mobility comments: MaxA +2-3 rolling side to side and the 4th person for repositioning of sheets and performing pericare. Pt totalA for scooting head closer to HO.    Transfers                 General transfer comment: PT to order bariatric recliner/lift chair.    Balance                                            ADL either performed or assessed with clinical judgement   ADL Overall ADL's : Needs assistance/impaired Eating/Feeding: NPO Eating/Feeding Details (indicate cue type and reason): feeding tube Grooming: Minimal assistance   Upper Body Bathing: Maximal assistance   Lower Body Bathing: Total assistance   Upper Body Dressing : Maximal assistance   Lower Body Dressing: Total assistance   Toilet Transfer: Total assistance;+2 for physical assistance;+2 for safety/equipment Toilet Transfer Details (indicate cue type and reason): staff advised to use maxi sky when recliner gets delivered         Functional mobility during ADLs: Maximal assistance;+2 for physical assistance;Cueing for safety;Cueing for sequencing;Rolling walker General ADL Comments: Pt limited by decreased strength, decreased ability to care for self and decreased activity tolerance. Pt with large body habitus and requires +3-4 staff for bed mobility.     Vision Baseline Vision/History: No visual deficits Patient Visual Report: No change from baseline Vision Assessment?: No apparent visual deficits     Perception     Praxis      Pertinent Vitals/Pain Pain Assessment: Faces Faces Pain Scale: Hurts a little bit Pain Location: generalized Pain Descriptors / Indicators: Discomfort Pain Intervention(s): Monitored during session;Premedicated before session     Hand Dominance Right   Extremity/Trunk Assessment Upper Extremity Assessment Upper Extremity Assessment: Generalized weakness;RUE deficits/detail;LUE deficits/detail RUE Deficits / Details: AROM, WFLs, fair  grip LUE Deficits / Details: AROM, WFLs, fair grip   Lower Extremity Assessment Lower Extremity Assessment: Generalized weakness   Cervical / Trunk Assessment Cervical / Trunk Assessment: Other exceptions Cervical / Trunk Exceptions: large body habitus   Communication Communication Communication: Tracheostomy    Cognition Arousal/Alertness: Awake/alert Behavior During Therapy: WFL for tasks assessed/performed Overall Cognitive Status: Within Functional Limits for tasks assessed                                 General Comments: Pt mouthing words due to trach and unable to always be heard.   General Comments  O2 >90% on trach, HR 115 BPM with exerton.    Exercises     Shoulder Instructions      Home Living Family/patient expects to be discharged to:: Private residence Living Arrangements: Parent Available Help at Discharge: Family;Available 24 hours/day Type of Home: House                       Home Equipment: Walker - 2 wheels   Additional Comments: PT in room got more information from mother in room prior to OTR arrival.      Prior Functioning/Environment Level of Independence: Needs assistance  Gait / Transfers Assistance Needed: Pt reports since 07/2018, pt has been NWB on RLE and that has decreased ability to perform transfers and get OOB. Pt's mother assists with pericare. ADL's / Homemaking Assistance Needed: Pt able to perform UB ADL, but mother assisted with trunk, back and LB ADL.            OT Problem List: Decreased strength;Decreased activity tolerance;Impaired balance (sitting and/or standing);Decreased safety awareness;Pain;Cardiopulmonary status limiting activity;Obesity;Increased edema      OT Treatment/Interventions: Self-care/ADL training;Therapeutic exercise;Energy conservation;DME and/or AE instruction;Therapeutic activities;Cognitive remediation/compensation;Patient/family education;Balance training    OT Goals(Current goals can be found in the care plan section) Acute Rehab OT Goals Patient Stated Goal: to get OOB to recliner OT Goal Formulation: With patient/family Time For Goal Achievement: 07/16/20 Potential to Achieve Goals: Good ADL Goals Pt Will Perform Grooming: with set-up;sitting Pt Will Perform Upper Body Dressing: with  set-up;sitting Pt/caregiver will Perform Home Exercise Program: Increased strength;Both right and left upper extremity;With Supervision;With theraband Additional ADL Goal #1: Pt will increase to minA +2 for bed mobility to increase independence for OOB ADL. Additional ADL Goal #2: Pt will tolerate x5 mins of unsupported sitting for dynamic sitting balance tasks with O2 >90%.  OT Frequency: Min 2X/week   Barriers to D/C:            Co-evaluation PT/OT/SLP Co-Evaluation/Treatment: Yes Reason for Co-Treatment: Complexity of the patient's impairments (multi-system involvement);To address functional/ADL transfers;For patient/therapist safety   OT goals addressed during session: ADL's and self-care;Strengthening/ROM      AM-PAC OT "6 Clicks" Daily Activity     Outcome Measure Help from another person eating meals?: Total Help from another person taking care of personal grooming?: A Lot Help from another person toileting, which includes using toliet, bedpan, or urinal?: Total Help from another person bathing (including washing, rinsing, drying)?: A Lot Help from another person to put on and taking off regular upper body clothing?: A Lot Help from another person to put on and taking off regular lower body clothing?: Total 6 Click Score: 9   End of Session Equipment Utilized During Treatment: Oxygen Nurse Communication: Mobility status  Activity Tolerance: Patient tolerated treatment well;Patient limited by  pain;Treatment limited secondary to medical complications (Comment) Patient left: in bed;with call bell/phone within reach;with nursing/sitter in room;with family/visitor present  OT Visit Diagnosis: Unsteadiness on feet (R26.81);Muscle weakness (generalized) (M62.81);Pain                Time: 1220-1300 OT Time Calculation (min): 40 min Charges:  OT General Charges $OT Visit: 1 Visit OT Evaluation $OT Eval Moderate Complexity: 1 Mod  Flora Lipps, OTR/L Acute Rehabilitation  Services Pager: [phone] Office: [phone]   Brylie Sneath C 07/02/2020, 2:43 PM

## 2020-07-02 NOTE — Evaluation (Signed)
Physical Therapy Evaluation Patient Details Name: Carl Hill MRN: [id number] DOB: Oct 17, 1970 Today's Date: 07/02/2020   History of Present Illness  Pt is a 50 year old male who presented with buttock pain, fatigue and malaise.  Found to have hyperglycemia. CT pelvis demonstrated necrotizing fasciitis in L buttock, L upper thigh, and perineum. Taken to OR for debridement 1/10 c/b Afib with RVR and VT arrest s/p CPR/defib. Trached 1/26, remains on full vent support. PMHx: HTN, obesity, DM, AFib  Clinical Impression  Prior to admission, pt lives with his mother, was a limited household ambulator using a walker, and required assist for some ADL's I.e. toileting/peri care. Pt requiring 3-4 person maximal assist for rolling from right/left for linen change. Pt able to facilitate by reaching for rail. Pt presents with decreased functional mobility secondary to decreased skin integrity, obesity, and weakness. Likely will need Maxi Sky for out of bed mobility. Bariatric chair ordered. Recommending post acute rehab at d/c; pt/pt son in agreement.    Follow Up Recommendations LTACH    Equipment Recommendations  Other (comment) (defer)    Recommendations for Other Services       Precautions / Restrictions Precautions Precautions: Fall;Other (comment) Precaution Comments: trach on vent, rectal tube, large buttocks wound Restrictions Weight Bearing Restrictions: No      Mobility  Bed Mobility Overal bed mobility: Needs Assistance Bed Mobility: Rolling Rolling: Max assist;+2 for physical assistance;+2 for safety/equipment         General bed mobility comments: MaxA +2-3 rolling side to side and the 4th person for repositioning of sheets and performing pericare. Pt totalA for scooting head closer to HO.    Transfers                 General transfer comment: PT to order bariatric recliner/lift chair.  Ambulation/Gait                Stairs            Wheelchair  Mobility    Modified Rankin (Stroke Patients Only)       Balance                                             Pertinent Vitals/Pain Pain Assessment: Faces Faces Pain Scale: Hurts a little bit Pain Location: generalized Pain Descriptors / Indicators: Discomfort Pain Intervention(s): Monitored during session;Premedicated before session    Home Living Family/patient expects to be discharged to:: Private residence Living Arrangements: Parent Available Help at Discharge: Family;Available 24 hours/day Type of Home: House Home Access: Stairs to enter   [employer] of Steps: 6 (6 one entrance, 8 the other) Home Layout: Able to live on main level with bedroom/bathroom Home Equipment: Walker - 2 wheels Additional Comments: PT in room got more information from mother in room prior to OTR arrival.    Prior Function Level of Independence: Needs assistance   Gait / Transfers Assistance Needed: Pt reports since 07/2018, pt has been NWB on RLE and that has decreased ability to perform transfers and get OOB. Pt's mother assists with pericare.  ADL's / Homemaking Assistance Needed: Pt able to perform UB ADL, but mother assisted with trunk, back and LB ADL.        Hand Dominance   Dominant Hand: Right    Extremity/Trunk Assessment   Upper Extremity Assessment Upper Extremity Assessment: Generalized weakness  RUE Deficits / Details: AROM, WFLs, fair grip LUE Deficits / Details: AROM, WFLs, fair grip    Lower Extremity Assessment Lower Extremity Assessment: Generalized weakness    Cervical / Trunk Assessment Cervical / Trunk Assessment: Other exceptions Cervical / Trunk Exceptions: large body habitus  Communication   Communication: Tracheostomy  Cognition Arousal/Alertness: Awake/alert Behavior During Therapy: WFL for tasks assessed/performed Overall Cognitive Status: Within Functional Limits for tasks assessed                                  General Comments: Pt mouthing words due to trach and unable to always be heard.      General Comments General comments (skin integrity, edema, etc.): O2 >90% on trach, HR 115 BPM with exerton.    Exercises General Exercises - Lower Extremity Ankle Circles/Pumps: AAROM;Both;5 reps;Supine   Assessment/Plan    PT Assessment Patient needs continued PT services  PT Problem List Decreased strength;Decreased range of motion;Decreased activity tolerance;Decreased balance;Decreased mobility;Cardiopulmonary status limiting activity;Obesity       PT Treatment Interventions Functional mobility training;Therapeutic activities;Therapeutic exercise;Balance training;Patient/family education    PT Goals (Current goals can be found in the Care Plan section)  Acute Rehab PT Goals Patient Stated Goal: to get OOB to recliner PT Goal Formulation: With patient/family Time For Goal Achievement: 07/16/20 Potential to Achieve Goals: Fair    Frequency Min 2X/week   Barriers to discharge        Co-evaluation PT/OT/SLP Co-Evaluation/Treatment: Yes Reason for Co-Treatment: Complexity of the patient's impairments (multi-system involvement);For patient/therapist safety;To address functional/ADL transfers PT goals addressed during session: Mobility/safety with mobility OT goals addressed during session: ADL's and self-care;Strengthening/ROM       AM-PAC PT "6 Clicks" Mobility  Outcome Measure Help needed turning from your back to your side while in a flat bed without using bedrails?: Total Help needed moving from lying on your back to sitting on the side of a flat bed without using bedrails?: Total Help needed moving to and from a bed to a chair (including a wheelchair)?: Total Help needed standing up from a chair using your arms (e.g., wheelchair or bedside chair)?: Total Help needed to walk in hospital room?: Total Help needed climbing 3-5 steps with a railing? : Total 6 Click Score:  6    End of Session   Activity Tolerance: Patient tolerated treatment well Patient left: in bed;with call bell/phone within reach;with family/visitor present;with nursing/sitter in room Nurse Communication: Mobility status PT Visit Diagnosis: Muscle weakness (generalized) (M62.81);Other abnormalities of gait and mobility (R26.89)    Time: 8101-7510 PT Time Calculation (min) (ACUTE ONLY): 34 min   Charges:   PT Evaluation $PT Eval High Complexity: 1 High          Lillia Pauls, PT, DPT Acute Rehabilitation Services Pager [phone] Office [phone]   Norval Morton 07/02/2020, 3:37 PM

## 2020-07-02 NOTE — Progress Notes (Addendum)
eLink Physician-Brief Progress Note Patient Name: MENASHE KAFER DOB: 06/03/1970 MRN: [id number]   Date of Service  07/02/2020  HPI/Events of Note  Had hydrotherapy today on sacral wound and it is profusely bleeding bright red blood.asking bed side team to visit urgent  Notes, labs, meds reviewed.  Camera: Discussed with bed side team. Morbidly obese, Trach, in synchrony with Vent. Normal sinus, SBP 130.   eICU Interventions  - notified NP /bed side team  - get Hg/Hct and INR stat. -transfuse if has ongoing bleeding        Intervention Category Intermediate Interventions: Bleeding - evaluation and treatment with blood products  Ranee Gosselin 07/02/2020, 7:27 PM   20:48 Dr Silvestre Gunner note seen. Hg , INR stable. Continue care. Follow labs in AM.

## 2020-07-02 NOTE — Progress Notes (Addendum)
ANTICOAGULATION CONSULT NOTE  Pharmacy Consult for Enoxaparin & warfarin  Indication: atrial fibrillation  Patient Measurements: Height: 5\' 10"  (177.8 cm) Weight: (!) 264.4 kg (583 lb) IBW/kg (Calculated) : 73 Heparin Dosing Weight: 149.7 kg  Vital Signs: Temp: 99.3 F (37.4 C) (02/01 1100) Temp Source: Core (02/01 1100) BP: 112/72 (02/01 1400) Pulse Rate: 108 (02/01 1123)  Labs: Recent Labs    06/29/20 1815 06/30/20 0321 06/30/20 0321 07/01/20 0455 07/01/20 1415 07/01/20 1620 07/02/20 0248  HGB  --  7.8*   < > 6.7* 8.3*  --  8.5*  HCT  --  27.4*   < > 22.4* 28.3*  --  27.8*  PLT  --  169  --  169  --   --  174  LABPROT  --   --   --  14.5  --   --  15.4*  INR  --   --   --  1.2  --   --  1.3*  HEPARINUNFRC 0.56 0.63  --   --   --   --   --   HEPRLOWMOCWT  --   --   --   --   --  1.25  --   CREATININE  --  1.69*  --  1.49*  --   --  1.36*   < > = values in this interval not displayed.    Estimated Creatinine Clearance: 140.6 mL/min (A) (by C-G formula based on SCr of 1.36 mg/dL (H)).   Assessment: 49YOM admitted with septic shock secondary to necrotizing fascitis, on IV heparin for Afib. Was on xarelto PTA, but that is not appropriate based on patient's weight. Currently bridging warfarin with lovenox until INR therapeutic. LMWH level was elevated at 1.25 on 1/31 - dose was decreased to 225mg  q12hr. Will check steady state level in 3-4 doses.  INR subtherapeutic and relatively unchanged after 2 days of warfarin 15mg ; will increase warfarin to 17.5mg  today based on patient's weight. Will follow for possible further increases but will hold off now given interactions with amiodarone and amox/clav.  Goal of Therapy:  Xa lvl 0.5 - 1 INR 2 - 3 Monitor platelets by anticoagulation protocol: Yes   Plan:  Warfarin 17.5mg  once Enoxaparin 225 mg sub q q12hr - unsure which is accurate weight, will use 600 lb as weight and adjust as needed based on lvls Obtain Xa lvl after  3rd dose of dose change Daily INR CBC  2/31, PharmD PGY2 ID Pharmacy Resident  Please check AMION for all [facility] Pharmacy phone numbers After 10:00 PM, call Main Pharmacy [phone]  07/02/2020, 2:44 PM

## 2020-07-02 NOTE — Progress Notes (Signed)
K+ 3.5  Replaced per protocol  

## 2020-07-02 NOTE — Progress Notes (Signed)
Physical Therapy Wound Treatment Patient Details  Name: Carl Hill MRN: [id number] Date of Birth: [birth year]  Today's Date: 07/02/2020 Time: 1130-1215 Time Calculation (min): 45 min  Subjective  Subjective: Pt stating "I'm hot." Patient and Family Stated Goals: for wound to heal Prior Treatments: Debridement 06/11/2020  Pain Score: 6/10   Wound Assessment  Wound / Incision (Open or Dehisced) 06/29/20 Buttocks (Active)  Dressing Type ABD;Barrier Film (skin prep);Gauze (Comment);Santyl 07/02/20 1500  Dressing Changed Changed 07/02/20 1500  Dressing Status Clean;Dry;Intact 07/02/20 1500  Dressing Change Frequency Daily 07/02/20 1500  Site / Wound Assessment Yellow;Red;Granulation tissue;Bleeding;Black 07/02/20 1500  % Wound base Red or Granulating 60% 07/02/20 1500  % Wound base Yellow/Fibrinous Exudate 30% 07/02/20 1500  % Wound base Black/Eschar 10% 07/02/20 1500  % Wound base Other/Granulation Tissue (Comment) 0% 07/02/20 1500  Peri-wound Assessment Intact 07/02/20 1500  Wound Length (cm) 25 cm 06/29/20 1453  Wound Width (cm) 11 cm 06/29/20 1453  Wound Depth (cm) 10 cm 06/29/20 1453  Wound Volume (cm^3) 2750 cm^3 06/29/20 1453  Wound Surface Area (cm^2) 275 cm^2 06/29/20 1453  Margins Unattached edges (unapproximated) 07/02/20 1500  Closure None 07/01/20 1200  Drainage Amount Copious 07/02/20 1500  Drainage Description Sanguineous 07/02/20 1500  Treatment Debridement (Selective);Hydrotherapy (Pulse lavage);Packing (Saline gauze) 07/02/20 1500      Hydrotherapy Pulsed lavage therapy - wound location: buttocks Pulsed Lavage with Suction (psi): 12 psi Pulsed Lavage with Suction - Normal Saline Used: 2000 mL Pulsed Lavage Tip: Tip with splash shield Selective Debridement Selective Debridement - Location: buttocks Selective Debridement - Tools Used: Forceps;Scalpel Selective Debridement - Tissue Removed: yellow slough, eschar   Wound Assessment and Plan  Wound Therapy  - Assess/Plan/Recommendations Wound Therapy - Clinical Statement: Pt soiled upon arrival, with loose stool leakage from rectal tube in wound. Performed hydrotherapy to cleanse wound. Wound with copious amounts of bleeding in specific areas throughout. Pt will benefit from selective debridement and pulsatile lavage in order to cleanse wound and decrease bioburden. Wound Therapy - Functional Problem List: decreased mobility, obesity Factors Delaying/Impairing Wound Healing: Immobility;Diabetes Mellitus Hydrotherapy Plan: Debridement;Patient/family education;Pulsatile lavage with suction Wound Therapy - Frequency: 6X / week Wound Therapy - Follow Up Recommendations: Other (comment) (LTACH) Wound Plan: see above  Wound Therapy Goals- Improve the function of patient's integumentary system by progressing the wound(s) through the phases of wound healing (inflammation - proliferation - remodeling) by: Decrease Necrotic Tissue to: 20 Decrease Necrotic Tissue - Progress: Progressing toward goal Increase Granulation Tissue to: 80 Increase Granulation Tissue - Progress: Progressing toward goal Goals/treatment plan/discharge plan were made with and agreed upon by patient/family: Yes Time For Goal Achievement: 7 days Wound Therapy - Potential for Goals: Fair  Goals will be updated until maximal potential achieved or discharge criteria met.  Discharge criteria: when goals achieved, discharge from hospital, MD decision/surgical intervention, no progress towards goals, refusal/missing three consecutive treatments without notification or medical reason.  GP  Wyona Almas, PT, DPT Acute Rehabilitation Services Pager [phone] Office [phone]      Deno Etienne 07/02/2020, 3:22 PM

## 2020-07-02 NOTE — Progress Notes (Addendum)
PCCM progress note  Called to evaluate bedside for bleeding from sacral wound after hydrotherapy.  Diffuse bleeding with no identifiable vessel Vitals and labs are stable  Will apply thrombipads and dressing soaked in tranexamic acid Follow H/H and transfuse as needed.  Additional CC time- 35 mins  Chilton Greathouse MD [facility] See Amion for pager  If no response to pager , please call [phone] until 7pm After 7:00 pm call Elink  [phone] 07/02/2020, 7:35 PM

## 2020-07-02 NOTE — Progress Notes (Signed)
NAME:  Carl Hill, MRN:  [id number], DOB:  [birth year], LOS: 5 ADMISSION DATE:  06/10/2020, CONSULTATION DATE:  06/10/2020 REFERRING MD:  Lorin Mercy, CHIEF COMPLAINT:  Severe Sepsis    Brief History:  50 year old male who presented with buttock pain, fatigue and malaise.  Found to have hyperglycemia. CT pelvis demonstrated necrotizing fasciitis in L buttock, L upper thigh, and perineum. Taken to OR for debridement 1/10 c/b Afib with RVR and VT arrest s/p CPR/defib. Trached 1/26, remains on full vent support.  Past Medical History:  HTN, obesity, DM, AFib on DOAC  Significant Hospital Events:  1/10  Admitted to [facility]. General surgery consulted and debridement pursued. In the afternoon, patient developed hypotension with AF in RVR. Synchronized cardioversion unsuccessful with conversion to VT. CPR initated with ROSC after desynchronized defibrillation. 1/11 Debridement 1/18 Mucus plugging 1/19 Bronchoscopy >> mucus plug on Rt, airway collapse with exhalation 1/20 Persistent fever, change ABx 1/24 Increased vent requirements, habitus related +/- mucus plugging 1/25 Hypotensive to SBP 80s, Levo resumed, minimally responsive overnight off sedation, CT Head negative 1/26 Stable vent/pressor requirements, bronched, tracheostomy 1/29 start hydrotherapy 1/30 start lovenox/coumadin  Consults:  General Surgery ID s/o 1/25  Procedures:  ETT 1/10 >>  R IJ CVL 1/10 >>  L brachial A-line 1/10 >>   Significant Diagnostic Tests:   CT abd/pelvis 1/10 >> Extensive subcutaneous emphysema in the medial aspect of the upper thighs extending through perineum and superiorly to the medial left buttock. Gas extends into the pelvis surrounding the anus inferiorly and tracking superiorly along the left wall of the rectum.  CT C/A/P 1/22 >> Imaging limited due to body habitus. No gross fluid collection or gas seen. Bilateral consolidation with dependent atelectasis  CT Head 1/25 >> No acute intracranial  abnormality  Micro Data:  COVID/Flu 1/10 >> negative MRSA PCR 1/10 >> negative L buttocks wound 1/10 >> E. Coli (pansensitive), Streptococcus infantarious (sensitive to PCN, ceftriaxone, vanc), Peptostreptococcus species, rare GBS Blood 1/10 >> negative BAL 1/20 >> normal flora Resp Cx 1/26 > Normal flora  Antimicrobials:  Aztreonam 1/09 Metronidazole 1/09 Vancomycin 1/09, 1/11, 1/20 >>1/22 Cefepime 1/10 >> 1/13, 1/20 >>1/22 Clindamycin 1/10 >> 1/13 Ceftriaxone 1/13 >> 1/20 Metronidazole 1/17 >> 1/22 Meropenem 1/22 >> 1/25 Amoxicillin 1/24 (x 1 for PCN Allergy challenge) Unasyn 1/25 >> 1/27 Augmentin 1/27 >>  Interim History / Subjective:  Weaned to 50% FIO2. Appears more comfortable. No complaints. Mother at bedside requesting for assistance with POA   Objective   Blood pressure 112/72, pulse (!) 108, temperature 99.3 F (37.4 C), temperature source Core, resp. rate 18, height _0  (1.778 m), weight (!) 264.4 kg, SpO2 99 %.    Vent Mode: PCV FiO2 (%):  [45 %-60 %] 45 % Set Rate:  [18 bmp] 18 bmp PEEP:  [12 cmH20] 12 cmH20 Plateau Pressure:  [22 cmH20-28 cmH20] 22 cmH20   Intake/Output Summary (Last 24 hours) at 07/02/2020 1522 Last data filed at 07/02/2020 1400 Gross per 24 hour  Intake 2380 ml  Output 3250 ml  Net -870 ml   Filed Weights   06/25/20 0500 06/30/20 0500 07/02/20 0500  Weight: (!) 277.1 kg (!) 258.6 kg (!) 264.4 kg   Physical Exam: General: Morbidly obese, no acute distress HENT: [place], AT, OP clear, MMM Neck: Trach in place, c/d/i Eyes: EOMI, no scleral icterus Respiratory: Diminished breath sounds bilaterally.  No crackles, wheezing or rales Cardiovascular: RRR, -M/R/G, no JVD GI: BS+, soft, nontender Extremities:-Edema,-tenderness Neuro: AAO x4,  CNII-XII grossly intact GU: Foley in place   Resolved problems:  VT arrest 1/10, Ileus 1/15, Elevated LFTs from shock, Metabolic acidosis with lactic acidosis, Septic shock Acute metabolic  encephalopathy 2nd to sepsis, hypoxia, hypercapnia. Assessment & Plan:   Acute on chronic hypoxic/hypercapnic respiratory failure in setting of sepsis, HCAP, recurrent mucus plugging. 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 Bronchomalacia. Probable sleep disordered breathing. - Continue weaning FIO2. SpO2 goal >90% - Maintain PEEP at 12 cm H2O - Pulmonary hygiene with nebulizers  - Mucomyst x 5d - f/u CXR PRN - Routine trach care - Will eventually need LTAC if he has insurance benefits for this  Left buttock necrotizing fasciitis with E coli, Group B Streptococcus, and Peptostreptococcus in wound culture. - Started on hydrotherapy 1/29 - Surgery signed off. No further debridement indicated - Day 23 of antibiotics, currently on augmentin  Hypernatremia from diuresis and insensible losses - improved - Hold lasix - Decrease free water - Trend  Acute blood loss anemia, likely from frequent blood draws - S/p 1 U PRBC on 1/31 - Trend CBC   Paroxysmal Afib - currently NSR Hx of HLD. - continue amiodarone, lipitor - Continue lovenox/coumadin per pharmacy. 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 - Continue Lyrica/nortriptyline  Anemia of critical illness. - f/u CBC - transfuse for Hb < 7 or significant bleeding  Best practice (evaluated daily)  Diet: Continue TF DVT prophylaxis: lovenox/coumadin GI prophylaxis: Protonix  Mobility: Bedrest Disposition: ICU Family: Updated pt's mother at bedside 2/1 Code status: Full Code  Labs    CMP Latest Ref Rng & Units 07/02/2020 07/01/2020 06/30/2020  Glucose 70 - 99 mg/dL 177(H) 141(H) 136(H)  BUN 6 - 20 mg/dL 42(H) 47(H) 53(H)  Creatinine 0.61 - 1.24 mg/dL 1.36(H) 1.49(H) 1.69(H)  Sodium 135 - 145 mmol/L 144 147(H) 149(H)  Potassium 3.5 - 5.1 mmol/L 3.5 3.5 3.7  Chloride 98 - 111 mmol/L 103 102 105   CO2 22 - 32 mmol/L 29 32 35(H)  Calcium 8.9 - 10.3 mg/dL 8.4(L) 8.4(L) 8.1(L)  Total Protein 6.5 - 8.1 g/dL - - -  Total Bilirubin 0.3 - 1.2 mg/dL - - -  Alkaline Phos 38 - 126 U/L - - -  AST 15 - 41 U/L - - -  ALT 0 - 44 U/L - - -    CBC Latest Ref Rng & Units 07/02/2020 07/01/2020 07/01/2020  WBC 4.0 - 10.5 K/uL 9.0 - 10.6(H)  Hemoglobin 13.0 - 17.0 g/dL 8.5(L) 8.3(L) 6.7(LL)  Hematocrit 39.0 - 52.0 % 27.8(L) 28.3(L) 22.4(L)  Platelets 150 - 400 K/uL 174 - 169   ABG    Component Value Date/Time   PHART 7.401 06/23/2020 1204   PCO2ART 57.4 (H) 06/23/2020 1204   PO2ART 73 (L) 06/23/2020 1204   HCO3 35.5 (H) 06/23/2020 1204   TCO2 37 (H) 06/23/2020 1204   ACIDBASEDEF 2.0 06/13/2020 1221   O2SAT 94.0 06/23/2020 1204    CBG (last 3)  Recent Labs    07/02/20 0404 07/02/20 0751 07/02/20 1112  GLUCAP 161* 161* 160*   Signature:   The patient is critically ill with multiple organ systems failure and requires high complexity decision making for assessment and support, frequent evaluation and titration of therapies, application of advanced monitoring technologies and extensive interpretation of multiple databases.  Independent Critical Care Time: 36 Minutes.   Rodman Pickle, M.D.  [facility] 07/02/2020 3:22 PM   Please see Amion for pager number to reach on-call Pulmonary and Critical Care Team.

## 2020-07-03 DIAGNOSIS — N493 Fournier gangrene: Secondary | ICD-10-CM | POA: Diagnosis not present

## 2020-07-03 LAB — BASIC METABOLIC PANEL
Anion gap: 11 (ref 5–15)
BUN: 41 mg/dL — ABNORMAL HIGH (ref 6–20)
CO2: 27 mmol/L (ref 22–32)
Calcium: 8.3 mg/dL — ABNORMAL LOW (ref 8.9–10.3)
Chloride: 103 mmol/L (ref 98–111)
Creatinine, Ser: 1.29 mg/dL — ABNORMAL HIGH (ref 0.61–1.24)
GFR, Estimated: >60 mL/min (ref >60)
Glucose, Bld: 207 mg/dL — ABNORMAL HIGH (ref 70–99)
Potassium: 3.9 mmol/L (ref 3.5–5.1)
Sodium: 141 mmol/L (ref 135–145)

## 2020-07-03 LAB — GLUCOSE, CAPILLARY
Glucose-Capillary: 166 mg/dL — ABNORMAL HIGH (ref 70–99)
Glucose-Capillary: 182 mg/dL — ABNORMAL HIGH (ref 70–99)
Glucose-Capillary: 198 mg/dL — ABNORMAL HIGH (ref 70–99)
Glucose-Capillary: 204 mg/dL — ABNORMAL HIGH (ref 70–99)
Glucose-Capillary: 211 mg/dL — ABNORMAL HIGH (ref 70–99)
Glucose-Capillary: 216 mg/dL — ABNORMAL HIGH (ref 70–99)

## 2020-07-03 LAB — CBC
HCT: 25.7 % — ABNORMAL LOW (ref 39.0–52.0)
Hemoglobin: 7.6 g/dL — ABNORMAL LOW (ref 13.0–17.0)
MCH: 27.7 pg (ref 26.0–34.0)
MCHC: 29.6 g/dL — ABNORMAL LOW (ref 30.0–36.0)
MCV: 93.8 fL (ref 80.0–100.0)
Platelets: 190 10*3/uL (ref 150–400)
RBC: 2.74 MIL/uL — ABNORMAL LOW (ref 4.22–5.81)
RDW: 17.4 % — ABNORMAL HIGH (ref 11.5–15.5)
WBC: 9.3 10*3/uL (ref 4.0–10.5)
nRBC: 0 % (ref 0.0–0.2)

## 2020-07-03 LAB — MAGNESIUM: Magnesium: 2.1 mg/dL (ref 1.7–2.4)

## 2020-07-03 LAB — PROTIME-INR
INR: 1.6 — ABNORMAL HIGH (ref 0.8–1.2)
Prothrombin Time: 18.8 seconds — ABNORMAL HIGH (ref 11.4–15.2)

## 2020-07-03 MED ORDER — FENTANYL CITRATE (PF) 100 MCG/2ML IJ SOLN
100.0000 ug | INTRAMUSCULAR | Status: DC | PRN
Start: 2020-07-03 — End: 2020-07-08
  Administered 2020-07-03 – 2020-07-08 (×23): 100 ug via INTRAVENOUS
  Filled 2020-07-03 (×26): qty 2

## 2020-07-03 MED ORDER — WARFARIN SODIUM 7.5 MG PO TABS
15.0000 mg | ORAL_TABLET | Freq: Once | ORAL | Status: AC
  Administered 2020-07-03: 15 mg via ORAL
  Filled 2020-07-03: qty 2

## 2020-07-03 MED ORDER — OXYCODONE HCL 5 MG/5ML PO SOLN
10.0000 mg | Freq: Four times a day (QID) | ORAL | Status: DC
  Administered 2020-07-03 – 2020-07-09 (×23): 10 mg
  Filled 2020-07-03 (×23): qty 10

## 2020-07-03 MED ORDER — OXYCODONE HCL 5 MG PO TABS
10.0000 mg | ORAL_TABLET | Freq: Four times a day (QID) | ORAL | Status: DC
  Administered 2020-07-03: 10 mg
  Filled 2020-07-03: qty 2

## 2020-07-03 NOTE — Progress Notes (Signed)
PT Cancellation Note  Patient Details Name: HALL BIRCHARD MRN: [id number] DOB: 1971/03/09   Cancelled Treatment:    Reason Eval/Treat Not Completed: Other (comment) Hydrotherapy held today in light of copious bleeding yesterday, 2/1 and subsequent drop in hemoglobin. Will reassess tomorrow with surgery for resumption.  Lillia Pauls, PT, DPT Acute Rehabilitation Services Pager [phone] Office [phone]    Norval Morton 07/03/2020, 12:16 PM

## 2020-07-03 NOTE — Progress Notes (Signed)
Pt was placed on PSV by MD.

## 2020-07-03 NOTE — Progress Notes (Signed)
ANTICOAGULATION CONSULT NOTE  Pharmacy Consult for Enoxaparin & warfarin  Indication: atrial fibrillation  Patient Measurements: Height: 5\' 10"  (177.8 cm) Weight: (!) 264.4 kg (583 lb) IBW/kg (Calculated) : 73 Heparin Dosing Weight: 149.7 kg  Vital Signs: Temp: 98.3 F (36.8 C) (02/02 1141) Temp Source: Axillary (02/02 1141) BP: 95/63 (02/02 1100) Pulse Rate: 115 (02/02 0313)  Labs: Recent Labs    07/01/20 0455 07/01/20 1415 07/01/20 1620 07/02/20 0248 07/02/20 1942 07/03/20 0054  HGB 6.7*   < >  --  8.5* 8.3* 7.6*  HCT 22.4*   < >  --  27.8* 28.4* 25.7*  PLT 169  --   --  174  --  190  LABPROT 14.5  --   --  15.4* 16.0* 18.8*  INR 1.2  --   --  1.3* 1.3* 1.6*  HEPRLOWMOCWT  --   --  1.25  --   --   --   CREATININE 1.49*  --   --  1.36*  --  1.29*   < > = values in this interval not displayed.    Estimated Creatinine Clearance: 148.2 mL/min (A) (by C-G formula based on SCr of 1.29 mg/dL (H)).   Assessment: 49YOM admitted with septic shock secondary to necrotizing fascitis, on IV heparin for Afib. Was on xarelto PTA, but that is not appropriate based on patient's weight. Currently bridging warfarin with lovenox until INR therapeutic. LMWH level was elevated at 1.25 on 1/31 - dose was decreased to 225mg  q12hr. Will check steady state level in 3-4 doses.  INR remains subtherapeutic but has increased from 1.3 to 1.6 after warfarin 17.5mg  x1. Noted bleeding from sacral wound last night requiring thrombipads and topical TXA. Will dose reduce back to 15mg  today due to bleeding occurrence and follow-up with Xa level tomorrow for enoxaparin dosing.   Goal of Therapy:  Xa lvl 0.5 - 1 INR 2 - 3 Monitor platelets by anticoagulation protocol: Yes   Plan:  Warfarin 15mg  x1 Enoxaparin 225 mg sub q q12hr - unsure which is accurate weight, will use 600 lb as weight and adjust as needed based on lvls Obtain Xa lvl after 4th dose tomorrow AM Daily INR CBC  2/31,  PharmD PGY2 ID Pharmacy Resident  Please check AMION for all [facility] Pharmacy phone numbers After 10:00 PM, call Main Pharmacy [phone]  07/03/2020, 2:27 PM

## 2020-07-03 NOTE — Progress Notes (Signed)
Cortrak Tube Team Note:  Consult received to place a reposition patient's cortrak feeding tube.   Observed cortrak tube with RN at bedside. Noted redness on inner part of L nare where tube was hanging. It appears that tube was being pulled towards patient's right side causing pressure. RN to place feeding pump on patient's left side to take tubing off of reddened area.   If any further issues please contact the Cortrak team at [email or web address] (password [email or web address]).    Cammy Copa., RD, LDN, CNSC See AMiON for contact information

## 2020-07-03 NOTE — Progress Notes (Signed)
NAME:  Carl Hill, MRN:  [id number], DOB:  1970-11-01, LOS: 96 ADMISSION DATE:  06/10/2020, CONSULTATION DATE:  06/10/2020 REFERRING MD:  Lorin Mercy, CHIEF COMPLAINT:  Severe Sepsis    Brief History:  50 year old male who presented with buttock pain, fatigue and malaise.  Found to have hyperglycemia. CT pelvis demonstrated necrotizing fasciitis in L buttock, L upper thigh, and perineum. Taken to OR for debridement 1/10 c/b Afib with RVR and VT arrest s/p CPR/defib. Trached 1/26, remains on full vent support.  Past Medical History:  HTN, obesity, DM, AFib on DOAC  Significant Hospital Events:  1/10  Admitted to [facility]. General surgery consulted and debridement pursued. In the afternoon, patient developed hypotension with AF in RVR. Synchronized cardioversion unsuccessful with conversion to VT. CPR initated with ROSC after desynchronized defibrillation. 1/11 Debridement 1/18 Mucus plugging 1/19 Bronchoscopy >> mucus plug on Rt, airway collapse with exhalation 1/20 Persistent fever, change ABx 1/24 Increased vent requirements, habitus related +/- mucus plugging 1/25 Hypotensive to SBP 80s, Levo resumed, minimally responsive overnight off sedation, CT Head negative 1/26 Stable vent/pressor requirements, bronched, tracheostomy 1/29 start hydrotherapy 1/30 start lovenox/coumadin 2/2 Tolerating PS  Consults:  General Surgery ID s/o 1/25  Procedures:  ETT 1/10 >>  R IJ CVL 1/10 >>  L brachial A-line 1/10 >>   Significant Diagnostic Tests:   CT abd/pelvis 1/10 >> Extensive subcutaneous emphysema in the medial aspect of the upper thighs extending through perineum and superiorly to the medial left buttock. Gas extends into the pelvis surrounding the anus inferiorly and tracking superiorly along the left wall of the rectum.  CT C/A/P 1/22 >> Imaging limited due to body habitus. No gross fluid collection or gas seen. Bilateral consolidation with dependent atelectasis  CT Head 1/25 >> No acute  intracranial abnormality  Micro Data:  COVID/Flu 1/10 >> negative MRSA PCR 1/10 >> negative L buttocks wound 1/10 >> E. Coli (pansensitive), Streptococcus infantarious (sensitive to PCN, ceftriaxone, vanc), Peptostreptococcus species, rare GBS Blood 1/10 >> negative BAL 1/20 >> normal flora Resp Cx 1/26 > Normal flora  Antimicrobials:  Aztreonam 1/09 Metronidazole 1/09 Vancomycin 1/09, 1/11, 1/20 >>1/22 Cefepime 1/10 >> 1/13, 1/20 >>1/22 Clindamycin 1/10 >> 1/13 Ceftriaxone 1/13 >> 1/20 Metronidazole 1/17 >> 1/22 Meropenem 1/22 >> 1/25 Amoxicillin 1/24 (x 1 for PCN Allergy challenge) Unasyn 1/25 >> 1/27 Augmentin 1/27 >>  Interim History / Subjective:  Tolerating PS this am Reports back pain  Overnight had diffuse bleeding of sacral wound. Hemodynamically stable with stable hg   Objective   Blood pressure (!) 104/50, pulse (!) 115, temperature 98 F (36.7 C), temperature source Axillary, resp. rate 13, height _0  (1.778 m), weight (!) 264.4 kg, SpO2 95 %.    Vent Mode: PCV FiO2 (%):  [40 %-45 %] 40 % Set Rate:  [18 bmp] 18 bmp PEEP:  [10 DDU20-25 cmH20] 10 cmH20 Plateau Pressure:  [15 cmH20-18 cmH20] 15 cmH20   Intake/Output Summary (Last 24 hours) at 07/03/2020 1555 Last data filed at 07/03/2020 1500 Gross per 24 hour  Intake 3030 ml  Output 2260 ml  Net 770 ml   Filed Weights   06/25/20 0500 06/30/20 0500 07/02/20 0500  Weight: (!) 277.1 kg (!) 258.6 kg (!) 264.4 kg   Physical Exam: General: Morbidly obese, no acute distress HENT: [place], AT, OP clear, MMM Neck: Trach in place, c/d/i Eyes: EOMI, no scleral icterus Respiratory: Diminished breath sounds bilaterally.  No crackles, wheezing or rales Cardiovascular: RRR, -M/R/G, no JVD  GI: BS+, soft, nontender Extremities:-Edema,-tenderness Neuro: AAO x4, CNII-XII grossly intact GU: Foley in place  Resolved problems:  VT arrest 1/10, Ileus 1/15, Elevated LFTs from shock, Metabolic acidosis with lactic acidosis,  Septic shock Acute metabolic encephalopathy 2nd to sepsis, hypoxia, hypercapnia. 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 Bronchomalacia. Probable sleep disordered breathing. - PS as tolerated. ATC when able - Will need full vent support nightly - Pulmonary hygiene with nebulizers  - Mucomyst x 5d  - f/u CXR PRN - Routine trach care - Will eventually need LTAC if he has insurance benefits for this  Left buttock necrotizing fasciitis with E coli, Group B Streptococcus, and Peptostreptococcus in wound culture. Bleeding from sacral site - no further issue - Started on hydrotherapy 1/29 - Surgery signed off. No further debridement indicated - Day 24 of antibiotics, currently on augmentin - Thrombi pads and TMA soaked dressing for bleed  Hypernatremia from diuresis and insensible losses - improved - Continue free water - Trend  Acute blood loss anemia, likely from frequent blood draws - S/p 1 U PRBC on 1/31 - Trend CBC   Paroxysmal Afib - currently NSR Hx of HLD. - continue amiodarone, lipitor - Continue lovenox/coumadin per pharmacy. 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 - Continue Lyrica/nortriptyline  Anemia of critical illness. - f/u CBC - transfuse for Hb < 7 or significant bleeding  Best practice (evaluated daily)  Diet: Continue TF DVT prophylaxis: lovenox/coumadin GI prophylaxis: Protonix  Mobility: Bedrest Disposition: ICU Family: Updated pt's mother at bedside 2/2 Code status: Full Code  Labs    CMP Latest Ref Rng & Units 07/03/2020 07/02/2020 07/01/2020  Glucose 70 - 99 mg/dL 207(H) 177(H) 141(H)  BUN 6 - 20 mg/dL 41(H) 42(H) 47(H)  Creatinine 0.61 - 1.24 mg/dL 1.29(H) 1.36(H) 1.49(H)   Sodium 135 - 145 mmol/L 141 144 147(H)  Potassium 3.5 - 5.1 mmol/L 3.9 3.5 3.5  Chloride 98 - 111 mmol/L 103 103 102  CO2 22 - 32 mmol/L 27 29 32  Calcium 8.9 - 10.3 mg/dL 8.3(L) 8.4(L) 8.4(L)  Total Protein 6.5 - 8.1 g/dL - - -  Total Bilirubin 0.3 - 1.2 mg/dL - - -  Alkaline Phos 38 - 126 U/L - - -  AST 15 - 41 U/L - - -  ALT 0 - 44 U/L - - -    CBC Latest Ref Rng & Units 07/03/2020 07/02/2020 07/02/2020  WBC 4.0 - 10.5 K/uL 9.3 - 9.0  Hemoglobin 13.0 - 17.0 g/dL 7.6(L) 8.3(L) 8.5(L)  Hematocrit 39.0 - 52.0 % 25.7(L) 28.4(L) 27.8(L)  Platelets 150 - 400 K/uL 190 - 174   ABG    Component Value Date/Time   PHART 7.401 06/23/2020 1204   PCO2ART 57.4 (H) 06/23/2020 1204   PO2ART 73 (L) 06/23/2020 1204   HCO3 35.5 (H) 06/23/2020 1204   TCO2 37 (H) 06/23/2020 1204   ACIDBASEDEF 2.0 06/13/2020 1221   O2SAT 94.0 06/23/2020 1204    CBG (last 3)  Recent Labs    07/03/20 0758 07/03/20 1141 07/03/20 1535  GLUCAP 216* 204* 166*   Signature:   The patient is critically ill with multiple organ systems failure and requires high complexity decision making for assessment and support, frequent evaluation and titration of therapies,  application of advanced monitoring technologies and extensive interpretation of multiple databases.  Independent Critical Care Time: 31 Minutes.   Rodman Pickle, M.D. [facility] Care Medicine 07/03/2020 3:56 PM   Please see Amion for pager number to reach on-call Pulmonary and Critical Care Team.

## 2020-07-04 DIAGNOSIS — N493 Fournier gangrene: Secondary | ICD-10-CM | POA: Diagnosis not present

## 2020-07-04 LAB — GLUCOSE, CAPILLARY
Glucose-Capillary: 181 mg/dL — ABNORMAL HIGH (ref 70–99)
Glucose-Capillary: 181 mg/dL — ABNORMAL HIGH (ref 70–99)
Glucose-Capillary: 186 mg/dL — ABNORMAL HIGH (ref 70–99)
Glucose-Capillary: 188 mg/dL — ABNORMAL HIGH (ref 70–99)
Glucose-Capillary: 201 mg/dL — ABNORMAL HIGH (ref 70–99)
Glucose-Capillary: 206 mg/dL — ABNORMAL HIGH (ref 70–99)

## 2020-07-04 LAB — CBC
HCT: 23.9 % — ABNORMAL LOW (ref 39.0–52.0)
HCT: 27.9 % — ABNORMAL LOW (ref 39.0–52.0)
Hemoglobin: 6.8 g/dL — CL (ref 13.0–17.0)
Hemoglobin: 8 g/dL — ABNORMAL LOW (ref 13.0–17.0)
MCH: 27.5 pg (ref 26.0–34.0)
MCH: 27.5 pg (ref 26.0–34.0)
MCHC: 28.5 g/dL — ABNORMAL LOW (ref 30.0–36.0)
MCHC: 28.7 g/dL — ABNORMAL LOW (ref 30.0–36.0)
MCV: 96.8 fL (ref 80.0–100.0)
MCV: 95.9 fL (ref 80.0–100.0)
Platelets: 197 10*3/uL (ref 150–400)
Platelets: 229 10*3/uL (ref 150–400)
RBC: 2.47 MIL/uL — ABNORMAL LOW (ref 4.22–5.81)
RBC: 2.91 MIL/uL — ABNORMAL LOW (ref 4.22–5.81)
RDW: 17.4 % — ABNORMAL HIGH (ref 11.5–15.5)
RDW: 17.2 % — ABNORMAL HIGH (ref 11.5–15.5)
WBC: 8.1 10*3/uL (ref 4.0–10.5)
WBC: 9.6 10*3/uL (ref 4.0–10.5)
nRBC: 0.5 % — ABNORMAL HIGH (ref 0.0–0.2)
nRBC: 0.4 % — ABNORMAL HIGH (ref 0.0–0.2)

## 2020-07-04 LAB — BASIC METABOLIC PANEL
Anion gap: 9 (ref 5–15)
BUN: 43 mg/dL — ABNORMAL HIGH (ref 6–20)
CO2: 31 mmol/L (ref 22–32)
Calcium: 8.1 mg/dL — ABNORMAL LOW (ref 8.9–10.3)
Chloride: 104 mmol/L (ref 98–111)
Creatinine, Ser: 1.18 mg/dL (ref 0.61–1.24)
GFR, Estimated: >60 mL/min (ref >60)
Glucose, Bld: 227 mg/dL — ABNORMAL HIGH (ref 70–99)
Potassium: 3.6 mmol/L (ref 3.5–5.1)
Sodium: 144 mmol/L (ref 135–145)

## 2020-07-04 LAB — HEMOGLOBIN AND HEMATOCRIT, BLOOD
HCT: 26.4 % — ABNORMAL LOW (ref 39.0–52.0)
Hemoglobin: 7.7 g/dL — ABNORMAL LOW (ref 13.0–17.0)

## 2020-07-04 LAB — PREPARE RBC (CROSSMATCH)

## 2020-07-04 LAB — PROTIME-INR
INR: 2.9 — ABNORMAL HIGH (ref 0.8–1.2)
Prothrombin Time: 29.5 seconds — ABNORMAL HIGH (ref 11.4–15.2)

## 2020-07-04 LAB — MAGNESIUM: Magnesium: 2 mg/dL (ref 1.7–2.4)

## 2020-07-04 MED ORDER — INSULIN ASPART 100 UNIT/ML ~~LOC~~ SOLN
3.0000 [IU] | SUBCUTANEOUS | Status: DC
  Administered 2020-07-04 – 2020-07-05 (×6): 3 [IU] via SUBCUTANEOUS

## 2020-07-04 MED ORDER — WARFARIN SODIUM 10 MG PO TABS
10.0000 mg | ORAL_TABLET | Freq: Once | ORAL | Status: AC
  Administered 2020-07-04: 10 mg via ORAL
  Filled 2020-07-04: qty 1

## 2020-07-04 MED ORDER — SODIUM CHLORIDE 0.9% IV SOLUTION: Freq: Once | INTRAVENOUS | Status: AC

## 2020-07-04 NOTE — Progress Notes (Signed)
NAME:  Carl Hill, MRN:  [id number], DOB:  1970/11/05, LOS: 24 ADMISSION DATE:  06/10/2020, CONSULTATION DATE:  06/10/2020 REFERRING MD:  Lorin Mercy, CHIEF COMPLAINT:  Severe Sepsis    Brief History:  50 year old male who presented with buttock pain, fatigue and malaise.  Found to have hyperglycemia. CT pelvis demonstrated necrotizing fasciitis in L buttock, L upper thigh, and perineum. Taken to OR for debridement 1/10 c/b Afib with RVR and VT arrest s/p CPR/defib. Trached 1/26, remains on full vent support.  Past Medical History:  HTN, obesity, DM, AFib on DOAC  Significant Hospital Events:  1/10  Admitted to [facility] General surgery consulted and debridement pursued. In the afternoon, patient developed hypotension with AF in RVR. Synchronized cardioversion unsuccessful with conversion to VT. CPR initated with ROSC after desynchronized defibrillation. 1/11 Debridement 1/18 Mucus plugging 1/19 Bronchoscopy >> mucus plug on Rt, airway collapse with exhalation 1/20 Persistent fever, change ABx 1/24 Increased vent requirements, habitus related +/- mucus plugging 1/25 Hypotensive to SBP 80s, Levo resumed, minimally responsive overnight off sedation, CT Head negative 1/26 Stable vent/pressor requirements, bronched, tracheostomy 1/29 start hydrotherapy 1/30 start lovenox/coumadin 2/2 Tolerating PS  Consults:  General Surgery ID s/o 1/25  Procedures:  ETT 1/10 >>  R IJ CVL 1/10 >>  L brachial A-line 1/10 >>   Significant Diagnostic Tests:   CT abd/pelvis 1/10 >> Extensive subcutaneous emphysema in the medial aspect of the upper thighs extending through perineum and superiorly to the medial left buttock. Gas extends into the pelvis surrounding the anus inferiorly and tracking superiorly along the left wall of the rectum.  CT C/A/P 1/22 >> Imaging limited due to body habitus. No gross fluid collection or gas seen. Bilateral consolidation with dependent atelectasis  CT Head 1/25 >> No acute  intracranial abnormality  Micro Data:  COVID/Flu 1/10 >> negative MRSA PCR 1/10 >> negative L buttocks wound 1/10 >> E. Coli (pansensitive), Streptococcus infantarious (sensitive to PCN, ceftriaxone, vanc), Peptostreptococcus species, rare GBS Blood 1/10 >> negative BAL 1/20 >> normal flora Resp Cx 1/26 > Normal flora  Antimicrobials:  Aztreonam 1/09 Metronidazole 1/09 Vancomycin 1/09, 1/11, 1/20 >>1/22 Cefepime 1/10 >> 1/13, 1/20 >>1/22 Clindamycin 1/10 >> 1/13 Ceftriaxone 1/13 >> 1/20 Metronidazole 1/17 >> 1/22 Meropenem 1/22 >> 1/25 Amoxicillin 1/24 (x 1 for PCN Allergy challenge) Unasyn 1/25 >> 1/27 Augmentin 1/27 >>  Interim History / Subjective:  Tolerating PS. Receiving 1U PRBC. No active bleed.   Objective   Blood pressure (!) 133/55, pulse (!) 101, temperature 97.9 F (36.6 C), temperature source Oral, resp. rate 11, height _0  (1.778 m), weight (!) 264.4 kg, SpO2 99 %.    Vent Mode: Stand-by FiO2 (%):  [40 %] 40 % Set Rate:  [18 bmp] 18 bmp PEEP:  [5 cmH20-10 cmH20] 5 cmH20 Pressure Support:  [10 cmH20] 10 cmH20 Plateau Pressure:  [18 cmH20] 18 cmH20   Intake/Output Summary (Last 24 hours) at 07/04/2020 1526 Last data filed at 07/04/2020 1022 Gross per 24 hour  Intake 1213 ml  Output 2240 ml  Net -1027 ml   Filed Weights   06/25/20 0500 06/30/20 0500 07/02/20 0500  Weight: (!) 277.1 kg (!) 258.6 kg (!) 264.4 kg   Physical Exam: General: Morbidly obese, no acute distress HENT: [place], AT, OP clear, MMM Neck: Trach in place, c/d/i Eyes: EOMI, no scleral icterus Respiratory: Clear to auscultation bilaterally.  No crackles, wheezing or rales Cardiovascular: RRR, -M/R/G, no JVD GI: BS+, soft, nontender Extremities:-Edema,-tenderness Neuro: AAO  x4, CNII-XII grossly intact Psych: Normal mood, normal affect GU: Foley in place   Resolved problems:  VT arrest 1/10, Ileus 1/15, Elevated LFTs from shock, Metabolic acidosis with lactic acidosis, Septic  shock Acute metabolic encephalopathy 2nd to sepsis, hypoxia, hypercapnia. 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 Bronchomalacia. Probable sleep disordered breathing. - PS as tolerated. ATC when able - Will need full vent support nightly - Pulmonary hygiene with nebulizers  - DC Mucomyst x 5d  - f/u CXR PRN - Routine trach care - Will eventually need LTAC if he has insurance benefits for this  Left buttock necrotizing fasciitis with E coli, Group B Streptococcus, and Peptostreptococcus in wound culture. Bleeding from sacral site - no further issue - Hydrotherapy 1/29-2/3 - Surgery following. No further debridement indicated - Day 25 of antibiotics, currently on augmentin - Thrombi pads and TMA soaked dressing for bleed  Hypernatremia from diuresis and insensible losses - improved - Continue free water - Trend  Acute blood loss anemia, likely from frequent blood draws - S/p 1 U PRBC on 1/31 and 2/3. Appropriate Hg response to transfusion - Trend CBC   Paroxysmal Afib - currently NSR Hx of HLD. - continue amiodarone, lipitor - Continue lovenox/coumadin per pharmacy. 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 - Continue Lyrica/nortriptyline  Anemia of critical illness. - f/u CBC - transfuse for Hb < 7 or significant bleeding  Best practice (evaluated daily)  Diet: Continue TF DVT prophylaxis: lovenox/coumadin GI prophylaxis: Protonix  Mobility: Bedrest Disposition: ICU Family: Updated pt's mother at bedside 2/3 Code status: Full Code  Labs    CMP Latest Ref Rng & Units 07/04/2020 07/03/2020 07/02/2020  Glucose 70 - 99 mg/dL 227(H) 207(H) 177(H)  BUN 6 - 20 mg/dL 43(H) 41(H) 42(H)  Creatinine 0.61 -  1.24 mg/dL 1.18 1.29(H) 1.36(H)  Sodium 135 - 145 mmol/L 144 141 144  Potassium 3.5 - 5.1 mmol/L 3.6 3.9 3.5  Chloride 98 - 111 mmol/L 104 103 103  CO2 22 - 32 mmol/L _0 Calcium 8.9 - 10.3 mg/dL 8.1(L) 8.3(L) 8.4(L)  Total Protein 6.5 - 8.1 g/dL - - -  Total Bilirubin 0.3 - 1.2 mg/dL - - -  Alkaline Phos 38 - 126 U/L - - -  AST 15 - 41 U/L - - -  ALT 0 - 44 U/L - - -    CBC Latest Ref Rng & Units 07/04/2020 07/04/2020 07/03/2020  WBC 4.0 - 10.5 K/uL - 8.1 9.3  Hemoglobin 13.0 - 17.0 g/dL 7.7(L) 6.8(LL) 7.6(L)  Hematocrit 39.0 - 52.0 % 26.4(L) 23.9(L) 25.7(L)  Platelets 150 - 400 K/uL - 197 190   ABG    Component Value Date/Time   PHART 7.401 06/23/2020 1204   PCO2ART 57.4 (H) 06/23/2020 1204   PO2ART 73 (L) 06/23/2020 1204   HCO3 35.5 (H) 06/23/2020 1204   TCO2 37 (H) 06/23/2020 1204   ACIDBASEDEF 2.0 06/13/2020 1221   O2SAT 94.0 06/23/2020 1204    CBG (last 3)  Recent Labs    07/04/20 0343 07/04/20 0814 07/04/20 1312  GLUCAP 188* 206* 186*   Signature:   The patient is critically ill with multiple organ systems failure and requires high complexity decision making for assessment and support, frequent evaluation  and titration of therapies, application of advanced monitoring technologies and extensive interpretation of multiple databases.  Independent Critical Care Time: 31 Minutes.   Rodman Pickle, M.D. [facility] Pulmonary/Critical Care Medicine 07/04/2020 3:26 PM   Please see Amion for pager number to reach on-call Pulmonary and Critical Care Team.

## 2020-07-04 NOTE — Progress Notes (Signed)
Physical Therapy Wound Treatment Patient Details  Name: Carl Hill MRN: [id number] Date of Birth: 09/03/1970  Today's Date: 07/04/2020 Time: 0712-1975 Time Calculation (min): 37 min  Subjective  Subjective: Pt stating "I'm hot." Patient and Family Stated Goals: for wound to heal Prior Treatments: Debridement 06/11/2020  Pain Score: Pain Score: 7   Wound Assessment  Wound / Incision (Open or Dehisced) 06/29/20 Buttocks (Active)  Wound Image   07/04/20 1428  Dressing Type Barrier Film (skin prep);Gauze (Comment);Moist to dry;ABD;Santyl 07/04/20 1428  Dressing Changed Changed 07/04/20 1428  Dressing Status Clean;Dry 07/04/20 1428  Dressing Change Frequency Twice a day 07/04/20 1428  Site / Wound Assessment Bleeding;Red;Yellow 07/04/20 1428  % Wound base Red or Granulating 60% 07/04/20 1428  % Wound base Yellow/Fibrinous Exudate 40% 07/04/20 1428  % Wound base Black/Eschar 0% 07/04/20 1428  % Wound base Other/Granulation Tissue (Comment) 0% 07/04/20 1428  Peri-wound Assessment Intact 07/04/20 1428  Wound Length (cm) 25 cm 06/29/20 1453  Wound Width (cm) 11 cm 06/29/20 1453  Wound Depth (cm) 10 cm 06/29/20 1453  Wound Volume (cm^3) 2750 cm^3 06/29/20 1453  Wound Surface Area (cm^2) 275 cm^2 06/29/20 1453  Margins Unattached edges (unapproximated) 07/04/20 1428  Closure None 07/03/20 1930  Drainage Amount Moderate 07/04/20 1428  Drainage Description Sanguineous;Serous 07/04/20 1428  Treatment Cleansed;Packing (Saline gauze) 07/04/20 1428   Santyl applied to wound bed prior to applying dressing.    Hydrotherapy Pulsed lavage therapy - wound location: buttocks Pulsed Lavage with Suction (psi): 12 psi Pulsed Lavage with Suction - Normal Saline Used: 2000 mL Pulsed Lavage Tip: Tip with splash shield Selective Debridement Selective Debridement - Location: buttocks Selective Debridement - Tools Used: Forceps;Scalpel Selective Debridement - Tissue Removed: yellow slough, eschar    Wound Assessment and Plan  Wound Therapy - Assess/Plan/Recommendations Wound Therapy - Clinical Statement: Per surgical PA, will stop seeing this patient in the short term due to increased bleeding with debridement.  Pt's wound could benefit in the future for decreased bioburden and selective debridement if bleeding could be managed. Wound Therapy - Functional Problem List: decreased mobility, obesity Factors Delaying/Impairing Wound Healing: Immobility;Diabetes Mellitus Hydrotherapy Plan: Debridement;Patient/family education;Pulsatile lavage with suction Wound Therapy - Frequency: 6X / week Wound Therapy - Follow Up Recommendations: Other (comment) (LTACH) Wound Plan: see above  Wound Therapy Goals- Improve the function of patient's integumentary system by progressing the wound(s) through the phases of wound healing (inflammation - proliferation - remodeling) by: Decrease Necrotic Tissue to: 20 Decrease Necrotic Tissue - Progress: Progressing toward goal Increase Granulation Tissue to: 80 Increase Granulation Tissue - Progress: Progressing toward goal Goals/treatment plan/discharge plan were made with and agreed upon by patient/family: Yes Time For Goal Achievement: 7 days Wound Therapy - Potential for Goals: Fair  Goals will be updated until maximal potential achieved or discharge criteria met.  Discharge criteria: when goals achieved, discharge from hospital, MD decision/surgical intervention, no progress towards goals, refusal/missing three consecutive treatments without notification or medical reason.  GP   07/04/2020  Ginger Carne., PT Acute Rehabilitation Services [phone]  (pager) [phone]  (office)  Carl Hill 07/04/2020, 2:35 PM

## 2020-07-04 NOTE — Progress Notes (Signed)
ANTICOAGULATION CONSULT NOTE  Pharmacy Consult for Warfarin  Indication: atrial fibrillation  Patient Measurements: Height: 5\' 10"  (177.8 cm) Weight: (!) 264.4 kg (583 lb) IBW/kg (Calculated) : 73 Heparin Dosing Weight: 149.7 kg  Vital Signs: Temp: 97.9 F (36.6 C) (02/03 0945) Temp Source: Oral (02/03 0945) BP: 133/55 (02/03 1015) Pulse Rate: 101 (02/03 0635)  Labs: Recent Labs    07/01/20 1620 07/02/20 0248 07/02/20 0248 07/02/20 1942 07/03/20 0054 07/04/20 0131 07/04/20 1251  HGB  --  8.5*   < > 8.3* 7.6* 6.8* 7.7*  HCT  --  27.8*   < > 28.4* 25.7* 23.9* 26.4*  PLT  --  174  --   --  190 197  --   LABPROT  --  15.4*   < > 16.0* 18.8* 29.5*  --   INR  --  1.3*   < > 1.3* 1.6* 2.9*  --   HEPRLOWMOCWT 1.25  --   --   --   --   --   --   CREATININE  --  1.36*  --   --  1.29* 1.18  --    < > = values in this interval not displayed.    Estimated Creatinine Clearance: 162 mL/min (by C-G formula based on SCr of 1.18 mg/dL).   Assessment: 49YOM admitted with septic shock secondary to necrotizing fascitis, on IV heparin for Afib. Was on xarelto PTA, but that is not appropriate based on patient's weight. Previously bridging warfarin with lovenox until INR therapeutic. LMWH level was elevated at 1.25 on 1/31 - dose was decreased to 225mg  q12hr. Patient with some sacral wound bleeding on 2/1 PM and received topical TXA/thrombi pads.   INR jumped to therapeutic today (1.6 to 2.9). Will stop lovenox today since INR therapeutic on warfarin and reduce warfarin dose to 10mg  to prevent further large INR jump tomorrow. No further significant bleeding was noted today around sacral wound. Will monitor closely and trend INR/CBC tomorrow.  Goal of Therapy:  INR 2 - 3 Monitor platelets by anticoagulation protocol: Yes   Plan:  Warfarin 10mg  x1 Daily INR CBC  2/31, PharmD PGY2 ID Pharmacy Resident  Please check AMION for all [facility] Pharmacy phone numbers After 10:00 PM, call  Main Pharmacy [phone]  07/04/2020, 2:48 PM

## 2020-07-04 NOTE — Progress Notes (Signed)
Occupational Therapy Treatment Patient Details Name: Carl Hill MRN: [id number] DOB: 1971-04-04 Today's Date: 07/04/2020    History of present illness Pt is a 50 year old male who presented with buttock pain, fatigue and malaise.  Found to have hyperglycemia. CT pelvis demonstrated necrotizing fasciitis in L buttock, L upper thigh, and perineum. Taken to OR for debridement 1/10 c/b Afib with RVR and VT arrest s/p CPR/defib. Trached 1/26, remains on full vent support. PMHx: HTN, obesity, DM, AFib   OT comments  Pt seen in conjunction with PT to further assess and progress mobility. Mom present during session and states that pt was ambulatory, however this has been limited for the past year due to a wound on his R heel. Pt able to communicate using a white board and stated he "hates being like this". Feel pt would greatly benefit from a Kreg Tilt bed to verticalize pt to increase independence with mobility and ADL tasks. The Tilt bed chair feature would also most likely be more comfortable and reduce pressure to buttocks in sitting position as well. Verbal order received form Dr Everardo All. Order placed for [facility] Bariatric Tilt bed. Spoke with bed rep and bed should be delivered today. Rep will help place pt on bed - nsg made aware. Scheduled to complete first tilt tomorrow 2/4 @ 1100 and staff education as well.  Pt is very motivated to get better.will continue to follow acutely.   Follow Up Recommendations  SNF;Supervision/Assistance - 24 hour    Equipment Recommendations  Wheelchair (measurements OT);Wheelchair cushion (measurements OT);Hospital bed;3 in 1 bedside commode    Recommendations for Other Services      Precautions / Restrictions Precautions Precautions: Fall;Other (comment) Precaution Comments: trach on vent, rectal tube, large buttocks wound; 582# Required Braces or Orthoses:  (ordering PRAFO?)       Mobility Bed Mobility Overal bed mobility: Needs Assistance              General bed mobility comments: Attempted to use chair position for pt to pull into sitting; poor angle of handles; pt pulling against therapists. Increased pain in buttock area in simulated seated position  Transfers                 General transfer comment: Unsafe to attempt at this time    Balance                                           ADL either performed or assessed with clinical judgement   ADL Overall ADL's : Needs assistance/impaired Eating/Feeding: NPO   Grooming: Minimal assistance   Upper Body Bathing: Minimal assistance;Bed level   Lower Body Bathing: Total assistance;Bed level                       Functional mobility during ADLs: +2 for physical assistance;Maximal assistance       Vision       Perception     Praxis      Cognition Arousal/Alertness: Awake/alert Behavior During Therapy: WFL for tasks assessed/performed Overall Cognitive Status: Within Functional Limits for tasks assessed                                          Exercises  Shoulder Instructions       General Comments Mom present adn updating on history adn heel wound which has limited mobility fo the last year    Pertinent Vitals/ Pain       Pain Assessment: Faces Faces Pain Scale: Hurts little more Pain Location: buttocks in certain positions Pain Descriptors / Indicators: Discomfort;Grimacing Pain Intervention(s): Limited activity within patient's tolerance  Home Living                                          Prior Functioning/Environment              Frequency  Min 2X/week        Progress Toward Goals  OT Goals(current goals can now be found in the care plan section)  Progress towards OT goals: Progressing toward goals  Acute Rehab OT Goals Patient Stated Goal: to get better OT Goal Formulation: With patient/family Time For Goal Achievement: 07/16/20 Potential to Achieve Goals:  Good ADL Goals Pt Will Perform Grooming: with set-up;sitting Pt Will Perform Upper Body Dressing: with set-up;sitting Pt/caregiver will Perform Home Exercise Program: Increased strength;Both right and left upper extremity;With Supervision;With theraband Additional ADL Goal #1: Pt will increase to minA +2 for bed mobility to increase independence for OOB ADL. Additional ADL Goal #2: Pt will tolerate x5 mins of unsupported sitting for dynamic sitting balance tasks with O2 >90%.  Plan Discharge plan remains appropriate    Co-evaluation    PT/OT/SLP Co-Evaluation/Treatment: Yes Reason for Co-Treatment: Complexity of the patient's impairments (multi-system involvement);For patient/therapist safety   OT goals addressed during session: ADL's and self-care;Strengthening/ROM      AM-PAC OT "6 Clicks" Daily Activity     Outcome Measure   Help from another person eating meals?: Total Help from another person taking care of personal grooming?: A Lot Help from another person toileting, which includes using toliet, bedpan, or urinal?: Total Help from another person bathing (including washing, rinsing, drying)?: A Lot Help from another person to put on and taking off regular upper body clothing?: A Lot Help from another person to put on and taking off regular lower body clothing?: Total 6 Click Score: 9    End of Session Equipment Utilized During Treatment:  (vent)  OT Visit Diagnosis: Unsteadiness on feet (R26.81);Muscle weakness (generalized) (M62.81);Pain Pain - part of body:  (buttocks)   Activity Tolerance Patient tolerated treatment well   Patient Left in bed;with call bell/phone within reach;with family/visitor present   Nurse Communication Mobility status;Other (comment)        Time: 1009-1040 OT Time Calculation (min): 31 min  Charges: OT General Charges $OT Visit: 1 Visit OT Treatments $Therapeutic Activity: 8-22 mins  Luisa Dago, OT/L   Acute OT Clinical  Specialist Acute Rehabilitation Services Pager [phone] Office [phone]    [facility] 07/04/2020, 1:02 PM

## 2020-07-04 NOTE — Progress Notes (Signed)
Nutrition Follow-up   DOCUMENTATION CODES:   Morbid obesity  INTERVENTION:   Continue TF via Cortrak: Pivot 1.5 at 70 ml/h (1680 ml per day) Prosource TF 90 ml QID  Provides 2840 kcal, 246 gm protein, 1260 ml free water daily.  NUTRITION DIAGNOSIS:   Increased nutrient needs related to wound healing as evidenced by estimated needs.  Ongoing  GOAL:   Patient will meet greater than or equal to 90% of their needs   Met with TF  MONITOR:   Vent status,Labs,TF tolerance,Skin  REASON FOR ASSESSMENT:   Consult Enteral/tube feeding initiation and management  ASSESSMENT:   50 yo male admitted with hyperglycemia, severe septic shock r/t Fournier's gangrene, AKI. PMH includes morbid obesity, HTN, DM, chronic LE wounds (L heel, L calf) A fib, laparoscopic gastric sleeve resection.   S/P tracheostomy 1/26. Patient remains on vent via trach.  Hopeful to begin trach collar trials this week. No longer requiring pressors.  Currently, Pivot 1.5 is infusing via Cortrak tube (tip is gastric) at goal rate of 70 ml/h. Receiving free water flushes 200 ml every 8 hours. Receiving Prosource TF 90 ml QID.  Patient is receiving daily hydrotherapy to wound. Patient with diffuse bleeding from wound 2/1, so hydrotherapy was held 2/2. Rectal tube in place to minimize wound contamination with stool. Patient is not a candidate for a colostomy due to body habitus.   Patient remains intubated on ventilator support. MV: 17 L/min Temp (24hrs), Avg:97.9 F (36.6 C), Min:97.6 F (36.4 C), Max:98.3 F (36.8 C)   Labs reviewed. BUN 43 CBG: 198-188  Medications reviewed and include colace, Novolog, Levemir, Miralax, Senokot.  Weight 264.4 kg 2/1 I/O -14.9 L since admission  Diet Order:   Diet Order            Diet NPO time specified  Diet effective midnight                 EDUCATION NEEDS:   Not appropriate for education at this time  Skin:  Skin Assessment: Skin Integrity  Issues: Skin Integrity Issues:: Stage II,Other (Comment) Stage II: R heel Other: necrotizing infection to pelvis, anus, rectum  Last BM:  2/3 rectal tube  Height:   Ht Readings from Last 1 Encounters:  06/14/20 '5\' 10"'  (1.778 m)    Weight:   Wt Readings from Last 1 Encounters:  07/02/20 (!) 264.4 kg    Ideal Body Weight:  75.5 kg  BMI:  Body mass index is 83.65 kg/m.  Estimated Nutritional Needs:   Kcal:  2500-2800  Protein:  225-240 gm  Fluid:  >/= 2.5 L    Lucas Mallow, RD, LDN, CNSC Please refer to Amion for contact information.

## 2020-07-04 NOTE — Progress Notes (Signed)
Physical Therapy Treatment Patient Details Name: Carl Hill MRN: [id number] DOB: 09-25-1970 Today's Date: 07/04/2020    History of Present Illness Pt is a 50 year old male who presented with buttock pain, fatigue and malaise.  Found to have hyperglycemia. CT pelvis demonstrated necrotizing fasciitis in L buttock, L upper thigh, and perineum. Taken to OR for debridement 1/10 c/b Afib with RVR and VT arrest s/p CPR/defib. Trached 1/26, remains on full vent support. PMHx: HTN, obesity, DM, AFib    PT Comments    Pt seen in conjunction with OT to further assess and progress mobility. In light of patient's wound and likely limited tolerance for sitting (and recent excessive bleeding), deferred plan for OOB to bariatric chair. Discussed with OT, pt, mother and feel pt would greatly benefit from a Kreg Tilt bed to verticalize pt to increase independence with mobility. The Tilt bed chair feature would also most likely be more comfortable and reduce pressure to buttocks in sitting position as well. OT to followed up on getting order from MD and order placed for [facility] Bariatric Tilt bed. Scheduled to complete first tilt tomorrow 2/4 @ 1100 and staff education as well. PT contacted MD and wrote order for [facility] (start with one and rotate between rt and left leg every 2 hours). Pt wears size 18 shoe and plan to see how one PRAFO works prior to possibly ordering a second. Pt is very motivated to get better will continue to follow acutely.     Follow Up Recommendations  SNF (Pt's insurance does not cover LTACH (which would be most appropriate))     Equipment Recommendations  Other (comment) (defer to next venue)    Recommendations for Other Services       Precautions / Restrictions Precautions Precautions: Fall;Other (comment) Precaution Comments: trach on vent, rectal tube, large buttocks wound; 582# Required Braces or Orthoses:  (ordering PRAFO?)    Mobility  Bed Mobility Overal bed mobility:  Needs Assistance             General bed mobility comments: Attempted to use chair position for pt to pull into sitting; poor angle of handles; pt pulling against therapists. Increased pain in buttock area in simulated seated position. Pt able to "butt walk" up towards [facility] with bed in slight trendelenberg (he is not able to grasp head board to use UEs due to shape of headboard.  Transfers                 General transfer comment: Unsafe to attempt at this time  Ambulation/Gait                 Stairs             Wheelchair Mobility    Modified Rankin (Stroke Patients Only)       Balance                                            Cognition Arousal/Alertness: Awake/alert Behavior During Therapy: WFL for tasks assessed/performed Overall Cognitive Status: Within Functional Limits for tasks assessed                                        Exercises Other Exercises Other Exercises: hip internal rotation x 5 reps with pt  achieving neutral; educated on importance of keeping hip ROM for gait and pt agreeable to try PRAFO to assist with positioning. Other Exercises: heelslides x 5 reps each AROM    General Comments General comments (skin integrity, edema, etc.): Mom present throughout and helping with communicating with patient. He often "spells" words out by mouthing letters or writing them in the air. Mom states he was very immobile x 1 year due to heel wound and MD and HHPT did not want him wt-bearing on his foot. He walked only when necessary (ie. to bathroom) x 1 year.      Pertinent Vitals/Pain Pain Assessment: Faces Faces Pain Scale: Hurts little more Pain Location: buttocks in certain positions Pain Descriptors / Indicators: Discomfort;Grimacing Pain Intervention(s): Limited activity within patient's tolerance;Repositioned    Home Living                      Prior Function            PT Goals  (current goals can now be found in the care plan section) Acute Rehab PT Goals Patient Stated Goal: to get better PT Goal Formulation: With patient/family (discussed option of Kreg bed) Time For Goal Achievement: 07/16/20 Potential to Achieve Goals: Fair Progress towards PT goals: Progressing toward goals (anticipate slow progress)    Frequency    Min 3X/week      PT Plan Discharge plan needs to be updated;Frequency needs to be updated    Co-evaluation PT/OT/SLP Co-Evaluation/Treatment: Yes Reason for Co-Treatment: Complexity of the patient's impairments (multi-system involvement);For patient/therapist safety;To address functional/ADL transfers PT goals addressed during session: Mobility/safety with mobility;Strengthening/ROM        AM-PAC PT "6 Clicks" Mobility   Outcome Measure  Help needed turning from your back to your side while in a flat bed without using bedrails?: Total Help needed moving from lying on your back to sitting on the side of a flat bed without using bedrails?: Total Help needed moving to and from a bed to a chair (including a wheelchair)?: Total Help needed standing up from a chair using your arms (e.g., wheelchair or bedside chair)?: Total Help needed to walk in hospital room?: Total Help needed climbing 3-5 steps with a railing? : Total 6 Click Score: 6    End of Session   Activity Tolerance: Patient tolerated treatment well Patient left: in bed;with call bell/phone within reach;with family/visitor present Nurse Communication: Other (comment) (plan to request Kreg bed from MD) PT Visit Diagnosis: Muscle weakness (generalized) (M62.81);Other abnormalities of gait and mobility (R26.89)     Time: 6834-1962 PT Time Calculation (min) (ACUTE ONLY): 33 min  Charges:  $Therapeutic Activity: 8-22 mins                      Carl Hill, PT Pager [phone]    Carl Hill 07/04/2020, 4:56 PM

## 2020-07-04 NOTE — Progress Notes (Signed)
eLink Physician-Brief Progress Note Patient Name: DELTA DESHMUKH DOB: June 22, 1970 MRN: [id number]   Date of Service  07/04/2020  HPI/Events of Note  Patient with acute blood loss anemia with hemoglobin of 6.8 gm / dl.  eICU Interventions  Order entered to transfuse 1 unit PRBC.        Thomasene Lot Gionni Freese 07/04/2020, 4:06 AM

## 2020-07-04 NOTE — Progress Notes (Signed)
Inpatient Diabetes Program Recommendations  AACE/ADA: New Consensus Statement on Inpatient Glycemic Control (2015)  Target Ranges:  Prepandial:   less than 140 mg/dL      Peak postprandial:   less than 180 mg/dL (1-2 hours)      Critically ill patients:  140 - 180 mg/dL   Lab Results  Component Value Date   GLUCAP 206 (H) 07/04/2020   HGBA1C 9.6 (H) 06/10/2020    Review of Glycemic Control Results for Carl Hill, Carl Hill (MRN [id number]) as of 07/04/2020 11:00  Ref. Range 07/03/2020 07:58 07/03/2020 11:41 07/03/2020 15:35 07/03/2020 20:15 07/03/2020 23:40 07/04/2020 03:43 07/04/2020 08:14  Glucose-Capillary Latest Ref Range: 70 - 99 mg/dL 174 (H) 081 (H) 448 (H) 182 (H) 198 (H) 188 (H) 206 (H)   Inpatient Diabetes Program Recommendations:    - Add Novolog 4 units Q4 hours tube feed coverage.   Thanks,  Christena Deem RN, MSN, BC-ADM Inpatient Diabetes Coordinator Team Pager [phone] (8a-5p)

## 2020-07-04 NOTE — Progress Notes (Signed)
Orthopedic Tech Progress Note Patient Details:  Carl Hill 12-Jan-1971 [id number] Ortho Devices Type of Ortho Device: Prafo boot/shoe Ortho Device/Splint Location: RLE Ortho Device/Splint Interventions: Ordered,Application,Adjustment   Post Interventions Patient Tolerated: Well Instructions Provided: Care of device,Poper ambulation with device,Adjustment of device   [name] 07/04/2020, 6:20 PM

## 2020-07-04 NOTE — Progress Notes (Signed)
23 Days Post-Op     Subjective: Much more alert and increasing strength helping turn.  Objective: Vital signs in last 24 hours: Temp:  [97.6 F (36.4 C)-98.3 F (36.8 C)] 97.8 F (36.6 C) (02/03 0635) Pulse Rate:  [95-101] 101 (02/03 0635) Resp:  [8-25] 14 (02/03 0700) BP: (82-131)/(48-79) 82/72 (02/03 0700) SpO2:  [94 %-100 %] 96 % (02/03 0700) FiO2 (%):  [40 %] 40 % (02/03 0635) Last BM Date: 07/05/19 1930 per NG intake Urine 3290 No other intake output recorded. Afebrile vital signs are stable Glucose 227, WBC 8.1, H/H 6.8/23.9, platelets 197,000 INR 2.9 Afebrile blood pressure stable, on vent FiO2 40%   Intake/Output from previous day: 02/02 0701 - 02/03 0700 In: 1940 [I.V.:10; NG/GT:1930] Out: 3290 [Urine:3290] Intake/Output this shift: No intake/output data recorded.  General appearance: alert, cooperative, no distress and On vent. Skin: Skin color, texture, turgor normal. No rashes or lesions or See picture below.      Lab Results:  Recent Labs    07/03/20 0054 07/04/20 0131  WBC 9.3 8.1  HGB 7.6* 6.8*  HCT 25.7* 23.9*  PLT 190 197    BMET Recent Labs    07/03/20 0054 07/04/20 0131  NA 141 144  K 3.9 3.6  CL 103 104  CO2 27 31  GLUCOSE 207* 227*  BUN 41* 43*  CREATININE 1.29* 1.18  CALCIUM 8.3* 8.1*   PT/INR Recent Labs    07/03/20 0054 07/04/20 0131  LABPROT 18.8* 29.5*  INR 1.6* 2.9*    No results for input(s): AST, ALT, ALKPHOS, BILITOT, PROT, ALBUMIN in the last 168 hours.   Lipase  No results found for: LIPASE   Medications: . albuterol  2.5 mg Nebulization BID  . amiodarone  200 mg Per Tube Daily  . amoxicillin-clavulanate  1 tablet Per Tube Q8H  . atorvastatin  20 mg Per Tube QPM  . chlorhexidine gluconate (MEDLINE KIT)  15 mL Mouth Rinse BID  . Chlorhexidine Gluconate Cloth  6 each Topical Daily  . collagenase  1 application Topical Daily  . docusate  100 mg Per Tube BID  . enoxaparin (LOVENOX) injection  225 mg  Subcutaneous Q12H  . feeding supplement (PROSource TF)  90 mL Per Tube QID  . free water  200 mL Per Tube Q8H  . insulin aspart  0-20 Units Subcutaneous Q4H  . insulin detemir  40 Units Subcutaneous BID  . levothyroxine  75 mcg Per Tube Daily  . mouth rinse  15 mL Mouth Rinse 10 times per day  . nortriptyline  25 mg Per Tube BID  . oxyCODONE  10 mg Per Tube Q6H  . pantoprazole sodium  40 mg Per Tube Q24H  . polyethylene glycol  17 g Per Tube BID  . pregabalin  300 mg Per Tube BID  . sennosides  10 mL Per Tube BID  . sodium chloride flush  10-40 mL Intracatheter Q12H  . sodium chloride flush  3 mL Intravenous Q12H  . Thrombi-Pad  1 each Topical Once  . Warfarin - Pharmacist Dosing Inpatient   Does not apply q1600   . sodium chloride Stopped (06/28/20 2035)  . feeding supplement (PIVOT 1.5 CAL) 1,000 mL (07/04/20 0511)    Assessment/Plan A. Fib VT Arrest (1/10) DM2 Hypothyroidism Hx HTN S/p trach   50 yo male with septic shock secondary to perianal/perineal necrotizing soft tissue infection-s/p operative debridement x2(06/10/20 and 06/11/20) - Continuedaily saline wet-to-dry dressings and hydro - No indication for further debridement  -  Antibiotics per ID - currently onaugmentin - Appreciate CCM's assistance in management of patient - As mentioned previously, the patient is not a candidate for colostomy 2/2 body habitus.     FEN: Tube feeding ID: Augmentin 1/27 >> day 8 DVT Lovenox/INR 2.9   Plan: We will stop the hydrotherapy today.  We will increase dressing changes to twice daily wet-to-dry, with Santyl over the dusky area noted picture below left buttocks.  We will look at it again next week.  LOS: 24 days    Carl Hill 07/04/2020 Please see Amion

## 2020-07-05 DIAGNOSIS — N493 Fournier gangrene: Secondary | ICD-10-CM | POA: Diagnosis not present

## 2020-07-05 LAB — BASIC METABOLIC PANEL
Anion gap: 11 (ref 5–15)
BUN: 41 mg/dL — ABNORMAL HIGH (ref 6–20)
CO2: 30 mmol/L (ref 22–32)
Calcium: 8.8 mg/dL — ABNORMAL LOW (ref 8.9–10.3)
Chloride: 105 mmol/L (ref 98–111)
Creatinine, Ser: 1.14 mg/dL (ref 0.61–1.24)
GFR, Estimated: >60 mL/min (ref >60)
Glucose, Bld: 215 mg/dL — ABNORMAL HIGH (ref 70–99)
Potassium: 3.5 mmol/L (ref 3.5–5.1)
Sodium: 146 mmol/L — ABNORMAL HIGH (ref 135–145)

## 2020-07-05 LAB — TYPE AND SCREEN
ABO/RH(D): O POS
Antibody Screen: NEGATIVE
Unit division: 0
Unit division: 0

## 2020-07-05 LAB — BPAM RBC
Blood Product Expiration Date: 202202012359
Blood Product Expiration Date: 202203082359
ISSUE DATE / TIME: 202201310937
ISSUE DATE / TIME: 202202030606
Unit Type and Rh: 5100
Unit Type and Rh: 9500

## 2020-07-05 LAB — PROTIME-INR
INR: 3.8 — ABNORMAL HIGH (ref 0.8–1.2)
Prothrombin Time: 36.1 seconds — ABNORMAL HIGH (ref 11.4–15.2)

## 2020-07-05 LAB — GLUCOSE, CAPILLARY
Glucose-Capillary: 176 mg/dL — ABNORMAL HIGH (ref 70–99)
Glucose-Capillary: 176 mg/dL — ABNORMAL HIGH (ref 70–99)
Glucose-Capillary: 193 mg/dL — ABNORMAL HIGH (ref 70–99)
Glucose-Capillary: 196 mg/dL — ABNORMAL HIGH (ref 70–99)
Glucose-Capillary: 200 mg/dL — ABNORMAL HIGH (ref 70–99)
Glucose-Capillary: 208 mg/dL — ABNORMAL HIGH (ref 70–99)

## 2020-07-05 LAB — MAGNESIUM: Magnesium: 2.1 mg/dL (ref 1.7–2.4)

## 2020-07-05 MED ORDER — INSULIN ASPART 100 UNIT/ML ~~LOC~~ SOLN
8.0000 [IU] | SUBCUTANEOUS | Status: DC
  Administered 2020-07-05 – 2020-07-08 (×17): 8 [IU] via SUBCUTANEOUS

## 2020-07-05 MED ORDER — POTASSIUM CHLORIDE 20 MEQ PO PACK
40.0000 meq | PACK | Freq: Once | ORAL | Status: AC
  Administered 2020-07-05: 40 meq
  Filled 2020-07-05: qty 2

## 2020-07-05 MED ORDER — ALBUTEROL SULFATE (2.5 MG/3ML) 0.083% IN NEBU
2.5000 mg | INHALATION_SOLUTION | RESPIRATORY_TRACT | Status: DC | PRN
  Administered 2020-07-05: 2.5 mg via RESPIRATORY_TRACT

## 2020-07-05 NOTE — Progress Notes (Signed)
NAME:  Carl Hill, MRN:  [id number], DOB:  05-11-1971, LOS: 37 ADMISSION DATE:  06/10/2020, CONSULTATION DATE:  06/10/2020 REFERRING MD:  Lorin Mercy, CHIEF COMPLAINT:  Severe Sepsis    Brief History:  50 year old male who presented with buttock pain, fatigue and malaise.  Found to have hyperglycemia. CT pelvis demonstrated necrotizing fasciitis in L buttock, L upper thigh, and perineum. Taken to OR for debridement 1/10 c/b Afib with RVR and VT arrest s/p CPR/defib. Trached 1/26, remains on full vent support.  Past Medical History:  HTN, obesity, DM, AFib on DOAC  Significant Hospital Events:  1/10  Admitted to [facility]. General surgery consulted and debridement pursued. In the afternoon, patient developed hypotension with AF in RVR. Synchronized cardioversion unsuccessful with conversion to VT. CPR initated with ROSC after desynchronized defibrillation. 1/11 Debridement 1/18 Mucus plugging 1/19 Bronchoscopy >> mucus plug on Rt, airway collapse with exhalation 1/20 Persistent fever, change ABx 1/24 Increased vent requirements, habitus related +/- mucus plugging 1/25 Hypotensive to SBP 80s, Levo resumed, minimally responsive overnight off sedation, CT Head negative 1/26 Stable vent/pressor requirements, bronched, tracheostomy 1/29 start hydrotherapy 1/30 start lovenox/coumadin 2/2 Tolerating PS  Consults:  General Surgery ID s/o 1/25  Procedures:  ETT 1/10 >>  R IJ CVL 1/10 >>  L brachial A-line 1/10 >>   Significant Diagnostic Tests:   CT abd/pelvis 1/10 >> Extensive subcutaneous emphysema in the medial aspect of the upper thighs extending through perineum and superiorly to the medial left buttock. Gas extends into the pelvis surrounding the anus inferiorly and tracking superiorly along the left wall of the rectum.  CT C/A/P 1/22 >> Imaging limited due to body habitus. No gross fluid collection or gas seen. Bilateral consolidation with dependent atelectasis  CT Head 1/25 >> No acute  intracranial abnormality  Micro Data:  COVID/Flu 1/10 >> negative MRSA PCR 1/10 >> negative L buttocks wound 1/10 >> E. Coli (pansensitive), Streptococcus infantarious (sensitive to PCN, ceftriaxone, vanc), Peptostreptococcus species, rare GBS Blood 1/10 >> negative BAL 1/20 >> normal flora Resp Cx 1/26 > Normal flora  Antimicrobials:  Aztreonam 1/09 Metronidazole 1/09 Vancomycin 1/09, 1/11, 1/20 >>1/22 Cefepime 1/10 >> 1/13, 1/20 >>1/22 Clindamycin 1/10 >> 1/13 Ceftriaxone 1/13 >> 1/20 Metronidazole 1/17 >> 1/22 Meropenem 1/22 >> 1/25 Amoxicillin 1/24 (x 1 for PCN Allergy challenge) Unasyn 1/25 >> 1/27 Augmentin 1/27 >>  Interim History / Subjective:  Tolerating TC this AM   Objective   Blood pressure 122/79, pulse (!) 105, temperature 98.2 F (36.8 C), temperature source Oral, resp. rate 12, height _0  (1.778 m), weight (!) 264.4 kg, SpO2 95 %.    Vent Mode: PCV FiO2 (%):  [40 %] 40 % Set Rate:  [18 bmp] 18 bmp Vt Set:  [580 mL] 580 mL PEEP:  [5 cmH20] 5 cmH20 Pressure Support:  [10 cmH20] 10 cmH20 Plateau Pressure:  [19 cmH20] 19 cmH20   Intake/Output Summary (Last 24 hours) at 07/05/2020 1426 Last data filed at 07/05/2020 1342 Gross per 24 hour  Intake 2140 ml  Output 2890 ml  Net -750 ml   Filed Weights   06/25/20 0500 06/30/20 0500 07/02/20 0500  Weight: (!) 277.1 kg (!) 258.6 kg (!) 264.4 kg   Physical Exam: General: Morbidly obese, no acute distress HENT: [place], AT, OP clear, MMM Neck: Trach in place, c/d/i Eyes: EOMI, no scleral icterus Respiratory: Clear to auscultation bilaterally.  No crackles, wheezing or rales Cardiovascular: RRR, -M/R/G, no JVD GI: BS+, soft, nontender Neuro: AAO  x4, CNII-XII grossly intact Psych: Normal mood, normal affect GU: Foley in place   Resolved problems:  VT arrest 1/10, Ileus 1/15, Elevated LFTs from shock, Metabolic acidosis with lactic acidosis, Septic shock Acute metabolic encephalopathy 2nd to sepsis, hypoxia,  hypercapnia. 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 Bronchomalacia. Probable sleep disordered breathing. - PS as tolerated, TC as toleraed - Will need full vent support nightly - Pulmonary hygiene with nebulizers   - Routine trach care - Will eventually need LTAC cs AIR if he has insurance benefits for this  Left buttock necrotizing fasciitis with E coli, Group B Streptococcus, and Peptostreptococcus in wound culture. Bleeding from sacral site - no further issue - Hydrotherapy 1/29-2/3 - Surgery following. No further debridement indicated - Day 25 of antibiotics, currently on augmentin - Thrombi pads and TMA soaked dressing for bleed  Hypernatremia from diuresis and insensible losses - improved - Continue free water - Trend  Acute blood loss anemia, likely from frequent blood draws - S/p 1 U PRBC on 1/31 and 2/3. Appropriate Hg response to transfusion - Trend CBC   Paroxysmal Afib - currently NSR Hx of HLD. - continue amiodarone, lipitor - Continue lovenox/coumadin per pharmacy. 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 - Continue Lyrica/nortriptyline  Anemia of critical illness. - f/u CBC - transfuse for Hb < 7 or significant bleeding  Best practice (evaluated daily)  Diet: Continue TF DVT prophylaxis: lovenox/coumadin GI prophylaxis: Protonix  Mobility: Bedrest Disposition: ICU Family:  Code status: Full Code  Labs    CMP Latest Ref Rng & Units 07/05/2020 07/04/2020 07/03/2020  Glucose 70 - 99 mg/dL 215(H) 227(H) 207(H)  BUN 6 - 20 mg/dL 41(H) 43(H) 41(H)  Creatinine 0.61 - 1.24 mg/dL 1.14 1.18 1.29(H)  Sodium 135 - 145 mmol/L 146(H) 144 141  Potassium 3.5 - 5.1 mmol/L 3.5 3.6 3.9  Chloride  98 - 111 mmol/L 105 104 103  CO2 22 - 32 mmol/L _0 Calcium 8.9 - 10.3 mg/dL 8.8(L) 8.1(L) 8.3(L)  Total Protein 6.5 - 8.1 g/dL - - -  Total Bilirubin 0.3 - 1.2 mg/dL - - -  Alkaline Phos 38 - 126 U/L - - -  AST 15 - 41 U/L - - -  ALT 0 - 44 U/L - - -    CBC Latest Ref Rng & Units 07/04/2020 07/04/2020 07/04/2020  WBC 4.0 - 10.5 K/uL 9.6 - 8.1  Hemoglobin 13.0 - 17.0 g/dL 8.0(L) 7.7(L) 6.8(LL)  Hematocrit 39.0 - 52.0 % 27.9(L) 26.4(L) 23.9(L)  Platelets 150 - 400 K/uL 229 - 197   ABG    Component Value Date/Time   PHART 7.401 06/23/2020 1204   PCO2ART 57.4 (H) 06/23/2020 1204   PO2ART 73 (L) 06/23/2020 1204   HCO3 35.5 (H) 06/23/2020 1204   TCO2 37 (H) 06/23/2020 1204   ACIDBASEDEF 2.0 06/13/2020 1221   O2SAT 94.0 06/23/2020 1204    CBG (last 3)  Recent Labs    07/05/20 0328 07/05/20 0810 07/05/20 1141  GLUCAP 196* 193* 200*   Signature:   CRITICAL CARE Performed by: Bonna Gains Hunsucker   Total critical care time: 32 minutes  Critical care time was exclusive of separately billable procedures and treating other patients.  Critical care was necessary  to treat or prevent imminent or life-threatening deterioration.  Critical care was time spent personally by me on the following activities: development of treatment plan with patient and/or surrogate as well as nursing, discussions with consultants, evaluation of patient's response to treatment, examination of patient, obtaining history from patient or surrogate, ordering and performing treatments and interventions, ordering and review of laboratory studies, ordering and review of radiographic studies, pulse oximetry and re-evaluation of patient's condition.   Lanier Clam, MD

## 2020-07-05 NOTE — Progress Notes (Signed)
Occupational Therapy Treatment Note  Pt seen in conjunction with PT and Kreg bed rep Janyth Pupa) for first tilt. Tilet completed while pt on TC with VSS. Decrease in BP as noted in PT note with tilt @ 39 degrees. Pt completing BUE ex once up @ 30 degrees. Pt stating he is "ready to walk". Encouraged nsg to place pt in chair position over the weekend. Will plan to meet bed rep again on Monday @ 1pm for continued staff education and to continue to advance pt. Discussed pain management with nsg and will schedule tilts around pain med schedule. Pt very motivated to improve. Hopeful for pt to advance to be able to participate in CIR. Will continue to follow acutely.     07/05/20 1200  OT Visit Information  Last OT Received On 07/05/20  Assistance Needed +3 or more (+2 for safety tilting for now)  PT/OT/SLP Co-Evaluation/Treatment Yes  Reason for Co-Treatment Complexity of the patient's impairments (multi-system involvement);For patient/therapist safety  OT goals addressed during session ADL's and self-care;Strengthening/ROM  History of Present Illness Pt is a 50 year old male who presented with buttock pain, fatigue and malaise.  Found to have hyperglycemia. CT pelvis demonstrated necrotizing fasciitis in L buttock, L upper thigh, and perineum. Taken to OR for debridement 1/10 c/b Afib with RVR and VT arrest s/p CPR/defib. Trached 1/26, remains on full vent support. PMHx: HTN, obesity, DM, AFib.  As of 07/04/20 started TC trials.  Precautions  Precautions Fall;Other (comment)  Precaution Comments trial TC/Vent, rectal tube, large buttocks wound hydro on hold, 582#  Pain Assessment  Pain Assessment Faces  Faces Pain Scale 8  Pain Location mostly back, chronic back issues, likes to be flexed forward (leans on RW at baseline).  Pain Descriptors / Indicators Grimacing;Guarding  Cognition  Arousal/Alertness Awake/alert  Behavior During Therapy WFL for tasks assessed/performed  Overall Cognitive Status  Within Functional Limits for tasks assessed  General Comments Generally intact, mouthing words, using dry erase to help with communication.  A shade on the anxious side.  Does well with step by step instruction.  Upper Extremity Assessment  Upper Extremity Assessment Generalized weakness  ADL  General ADL Comments nsg states pt has been doing his own oral care. Encouraged pt/nsg to have him do his UB bathing  Bed Mobility  Overal bed mobility Needs Assistance  General bed mobility comments Scooting to [facility] 4 3-4 person total assist with bed flat/slight trendelenberg.  Transfers  General transfer comment Kreg tilt bed utilized.  General Comments  General comments (skin integrity, edema, etc.) Pt was able to tilt in Kreg tilt bed today for ~20 mins (started slow, and got to ~39 degrees max tilt.  Pt remained on 40% TC 10L throughout session with VSS generally stable (some dip into the mid to upper 80s at highest tilt, but pt also gripping bed handles tightly, so questionable wave/accuracty.  RR in the 10-20s.  BPs progressively dipping the further we got upright with increased subjective lightheadedness at highest tilt, so only stayed there momentarily.  See vitals flow sheet for serial BPs. See PT note for BPs  Exercises  Exercises General Upper Extremity  General Exercises - Upper Extremity  Shoulder Flexion Both;10 reps;Seated;Standing (BUE in standing position)  Other Exercises  Other Exercises encouraged pt to complete theraband ex  OT - End of Session  Equipment Utilized During Treatment Oxygen (10L; FiO2 40%)  Activity Tolerance Patient tolerated treatment well  Patient left in bed;with call bell/phone within reach;with nursing/sitter  in room  Nurse Communication Mobility status;Other (comment) (plan for Tilt bed)  OT Assessment/Plan  OT Plan Discharge plan remains appropriate  OT Visit Diagnosis Unsteadiness on feet (R26.81);Muscle weakness (generalized) (M62.81);Pain  Pain - part  of body  (back)  OT Frequency (ACUTE ONLY) Min 2X/week  Follow Up Recommendations SNF;Supervision/Assistance - 24 hour  OT Equipment Wheelchair (measurements OT);Wheelchair cushion (measurements OT);Hospital bed;3 in 1 bedside commode  AM-PAC OT "6 Clicks" Daily Activity Outcome Measure (Version 2)  Help from another person eating meals? 1  Help from another person taking care of personal grooming? 3  Help from another person toileting, which includes using toliet, bedpan, or urinal? 1  Help from another person bathing (including washing, rinsing, drying)? 2  Help from another person to put on and taking off regular upper body clothing? 2  Help from another person to put on and taking off regular lower body clothing? 1  6 Click Score 10  OT Goal Progression  Progress towards OT goals Progressing toward goals  Acute Rehab OT Goals  Patient Stated Goal to get better  OT Goal Formulation With patient/family  Time For Goal Achievement 07/16/20  Potential to Achieve Goals Good  ADL Goals  Pt Will Perform Grooming with set-up;sitting  Pt Will Perform Upper Body Dressing with set-up;sitting  Pt/caregiver will Perform Home Exercise Program Increased strength;Both right and left upper extremity;With Supervision;With theraband  Additional ADL Goal #1 Pt will increase to minA +2 for bed mobility to increase independence for OOB ADL.  Additional ADL Goal #2 Pt will tolerate x5 mins of unsupported sitting for dynamic sitting balance tasks with O2 >90%.  OT Time Calculation  OT Start Time (ACUTE ONLY) 1106  OT Stop Time (ACUTE ONLY) 1148  OT Time Calculation (min) 42 min  OT General Charges  $OT Visit 1 Visit  OT Treatments  $Therapeutic Activity 23-37 mins  Luisa Dago, OT/L   Acute OT Clinical Specialist Acute Rehabilitation Services Pager [phone] Office [phone]

## 2020-07-05 NOTE — Progress Notes (Signed)
K+ 3.5  Replaced per protocol  

## 2020-07-05 NOTE — Progress Notes (Signed)
Physical Therapy Treatment Patient Details Name: Carl Hill MRN: [id number] DOB: 11-21-1970 Today's Date: 07/05/2020    History of Present Illness Pt is a 50 year old male who presented with buttock pain, fatigue and malaise.  Found to have hyperglycemia. CT pelvis demonstrated necrotizing fasciitis in L buttock, L upper thigh, and perineum. Taken to OR for debridement 1/10 c/b Afib with RVR and VT arrest s/p CPR/defib. Trached 1/26, remains on full vent support. PMHx: HTN, obesity, DM, AFib.  As of 07/04/20 started TC trials.    PT Comments    Pt tolerated his first tilt in bariatric Kreg tilt bed well with max tilt 39 degrees.  He remained on TC 40% 10L throughout session with VSS, some subjective reports at end of tilt of SOB, but I believe some of this was anxiety as VSS per monitor.  He was suctioned by RT just prior to session.   One of our most limiting factors were reports of back pain (this is a chronic issue for Carl Hill).  And progressive lowering BP the more upright we got (anticipated give he has been in bed so long).  Otherwise, successful day.  Pt also assisted in positioning legs, lifting legs to donn socks, raising arms.  OT to bring exercise band.    137/91 flat; 137/91 24; 112/85 32 HR 108: O2 96   Follow Up Recommendations  SNF     Equipment Recommendations  Hospital bed;Other (comment) (hoyer lift, air mattress, all bariatric equipment.)    Recommendations for Other Services       Precautions / Restrictions Precautions Precautions: Fall;Other (comment) Precaution Comments: trial TC/Vent, rectal tube, large buttocks wound hydro on hold, 582#    Mobility  Bed Mobility Overal bed mobility: Needs Assistance             General bed mobility comments: Scooting to HOB 4 3-4 person total assist with bed flat/slight trendelenberg.  Transfers                 General transfer comment: Kreg tilt bed utilized.  Ambulation/Gait                  Stairs             Wheelchair Mobility    Modified Rankin (Stroke Patients Only)       Balance                                            Cognition Arousal/Alertness: Awake/alert Behavior During Therapy: WFL for tasks assessed/performed Overall Cognitive Status: Within Functional Limits for tasks assessed                                 General Comments: Generally intact, mouthing words, using dry erase to help with communication.  A shade on the anxious side.  Does well with step by step instruction.      Exercises      General Comments General comments (skin integrity, edema, etc.): Pt was able to tilt in Kreg tilt bed today for ~20 mins (started slow, and got to ~39 degrees max tilt.  Pt remained on 40% TC 10L throughout session with VSS generally stable (some dip into the mid to upper 80s at highest tilt, but pt also gripping bed handles tightly, so questionable wave/accuracty.  RR in the 10-20s.  BPs progressively dipping the further we got upright with increased subjective lightheadedness at highest tilt, so only stayed there momentarily.  See vitals flow sheet for serial BPs.      Pertinent Vitals/Pain Pain Assessment: Faces Faces Pain Scale: Hurts whole lot Pain Location: mostly back, chronic back issues, likes to be flexed forward (leans on RW at baseline). Pain Descriptors / Indicators: Grimacing;Guarding Pain Intervention(s): Limited activity within patient's tolerance;Monitored during session;Repositioned;Other (comment) (RN bringing pain meds after session)    Home Living                      Prior Function            PT Goals (current goals can now be found in the care plan section) Acute Rehab PT Goals Patient Stated Goal: to get better Progress towards PT goals: Progressing toward goals    Frequency    Min 3X/week      PT Plan Current plan remains appropriate    Co-evaluation   Reason for  Co-Treatment: Complexity of the patient's impairments (multi-system involvement);Necessary to address cognition/behavior during functional activity;For patient/therapist safety PT goals addressed during session: Mobility/safety with mobility;Balance;Strengthening/ROM        AM-PAC PT "6 Clicks" Mobility   Outcome Measure  Help needed turning from your back to your side while in a flat bed without using bedrails?: Total Help needed moving from lying on your back to sitting on the side of a flat bed without using bedrails?: Total Help needed moving to and from a bed to a chair (including a wheelchair)?: Total Help needed standing up from a chair using your arms (e.g., wheelchair or bedside chair)?: Total Help needed to walk in hospital room?: Total Help needed climbing 3-5 steps with a railing? : Total 6 Click Score: 6    End of Session Equipment Utilized During Treatment: Oxygen Activity Tolerance: Patient limited by pain Patient left: in bed;with call bell/phone within reach;with nursing/sitter in room Nurse Communication: Other (comment) (RN and Environmental health practitioner in/out during session.) PT Visit Diagnosis: Muscle weakness (generalized) (M62.81);Other abnormalities of gait and mobility (R26.89)     Time: 9924-2683 PT Time Calculation (min) (ACUTE ONLY): 42 min  Charges:  $Therapeutic Activity: 8-22 mins                     Corinna Capra, PT, DPT  Acute Rehabilitation [phone] pager [phone]) [phone] office

## 2020-07-05 NOTE — Progress Notes (Signed)
Patient placed on 40% trach collar.  Currently tolerating well.  Will continue to monitor.  

## 2020-07-05 NOTE — Progress Notes (Signed)
ANTICOAGULATION CONSULT NOTE  Pharmacy Consult for Warfarin  Indication: atrial fibrillation  Patient Measurements: Height: 5\' 10"  (177.8 cm) Weight: (!) 264.4 kg (583 lb) IBW/kg (Calculated) : 73 Heparin Dosing Weight: 149.7 kg  Vital Signs: Temp: 98.2 F (36.8 C) (02/04 0345) Temp Source: Oral (02/04 0345) BP: 120/78 (02/04 0900) Pulse Rate: 105 (02/04 1104)  Labs: Recent Labs    07/03/20 0054 07/04/20 0131 07/04/20 1251 07/04/20 1644 07/05/20 0303  HGB 7.6* 6.8* 7.7* 8.0*  --   HCT 25.7* 23.9* 26.4* 27.9*  --   PLT 190 197  --  229  --   LABPROT 18.8* 29.5*  --   --  36.1*  INR 1.6* 2.9*  --   --  3.8*  CREATININE 1.29* 1.18  --   --  1.14    Estimated Creatinine Clearance: 167.7 mL/min (by C-G formula based on SCr of 1.14 mg/dL).   Assessment: 49YOM admitted with septic shock secondary to necrotizing fascitis, on IV heparin for Afib. Was on xarelto PTA, but that is not appropriate based on patient's weight. Previously bridging warfarin with lovenox until INR therapeutic. LMWH level was elevated at 1.25 on 1/31 - dose was decreased to 225mg  q12hr. Patient with some sacral wound bleeding on 2/1 PM and received topical TXA/thrombi pads.   INR further jumped today to supratherapeutic INR (2.9 to 3.8). Will hold warfarin today and consider further dose reduction tomorrow as appropriate.  Goal of Therapy:  INR 2 - 3 Monitor platelets by anticoagulation protocol: Yes   Plan:  Hold warfarin today Daily INR CBC  2/31, PharmD PGY2 ID Pharmacy Resident  Please check AMION for all [facility] phone numbers After 10:00 PM, call Main Pharmacy [phone]  07/05/2020, 11:37 AM

## 2020-07-06 DIAGNOSIS — N493 Fournier gangrene: Secondary | ICD-10-CM | POA: Diagnosis not present

## 2020-07-06 LAB — BASIC METABOLIC PANEL
Anion gap: 11 (ref 5–15)
BUN: 40 mg/dL — ABNORMAL HIGH (ref 6–20)
CO2: 28 mmol/L (ref 22–32)
Calcium: 8.6 mg/dL — ABNORMAL LOW (ref 8.9–10.3)
Chloride: 105 mmol/L (ref 98–111)
Creatinine, Ser: 1.1 mg/dL (ref 0.61–1.24)
GFR, Estimated: >60 mL/min (ref >60)
Glucose, Bld: 203 mg/dL — ABNORMAL HIGH (ref 70–99)
Potassium: 4.1 mmol/L (ref 3.5–5.1)
Sodium: 144 mmol/L (ref 135–145)

## 2020-07-06 LAB — PROTIME-INR
INR: 4.4 (ref 0.8–1.2)
Prothrombin Time: 40.5 seconds — ABNORMAL HIGH (ref 11.4–15.2)

## 2020-07-06 LAB — GLUCOSE, CAPILLARY
Glucose-Capillary: 170 mg/dL — ABNORMAL HIGH (ref 70–99)
Glucose-Capillary: 193 mg/dL — ABNORMAL HIGH (ref 70–99)
Glucose-Capillary: 196 mg/dL — ABNORMAL HIGH (ref 70–99)
Glucose-Capillary: 182 mg/dL — ABNORMAL HIGH (ref 70–99)
Glucose-Capillary: 180 mg/dL — ABNORMAL HIGH (ref 70–99)
Glucose-Capillary: 194 mg/dL — ABNORMAL HIGH (ref 70–99)

## 2020-07-06 LAB — MAGNESIUM: Magnesium: 2.1 mg/dL (ref 1.7–2.4)

## 2020-07-06 MED ORDER — CHLORHEXIDINE GLUCONATE 0.12 % MT SOLN
15.0000 mL | Freq: Two times a day (BID) | OROMUCOSAL | Status: DC
  Administered 2020-07-07 – 2020-08-05 (×57): 15 mL via OROMUCOSAL
  Filled 2020-07-06 (×53): qty 15

## 2020-07-06 MED ORDER — ORAL CARE MOUTH RINSE
15.0000 mL | Freq: Two times a day (BID) | OROMUCOSAL | Status: DC
  Administered 2020-07-07 – 2020-08-05 (×49): 15 mL via OROMUCOSAL

## 2020-07-06 NOTE — Evaluation (Deleted)
SLP Cancellation Note  Patient Details Name: [name] MRN: [id number] DOB: 1970/12/10   Cancelled treatment:       Reason Eval/Treat Not Completed: Other (comment) (Order received for PMSV and swallow evaluation, Thank you.  Pt currently remains on full vent support.  Will follow up for evaluations the week of February 7th.)  Rolena Infante, MS [facility] SLP Acute Rehab Services Office [phone] Pager [phone]    Chales Abrahams 07/06/2020, 7:36 AM

## 2020-07-06 NOTE — Progress Notes (Signed)
Patient taken off of ventilator and placed on 40% ATC.  Currently tolerating well.  Will continue to monitor.

## 2020-07-06 NOTE — Progress Notes (Signed)
eLink Physician-Brief Progress Note Patient Name: Carl Hill DOB: 01-18-1971 MRN: [id number]   Date of Service  07/06/2020  HPI/Events of Note  INR 4.4, no overt hemorrhage.  eICU Interventions  Continue to monitor INR and observe for any bleeding.        Thomasene Lot Kelsie Kramp 07/06/2020, 3:53 AM

## 2020-07-06 NOTE — Evaluation (Signed)
Clinical/Bedside Swallow Evaluation Patient Details  Name: Carl Hill MRN: [id number] Date of Birth: 05/24/1971  Today's Date: 07/06/2020 Time: SLP Start Time (ACUTE ONLY): 1531 SLP Stop Time (ACUTE ONLY): 1545 SLP Time Calculation (min) (ACUTE ONLY): 14 min  Past Medical History:  Past Medical History:  Diagnosis Date  . 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 Asthma   . Atrial fibrillation (HCC)   . Diabetes mellitus without complication (HCC)   . History of cardioversion    3 times   . Hypertension   . Sepsis due to Streptococcus, group B (HCC) 06/22/2020   Past Surgical History:  Past Surgical History:  Procedure Laterality Date  . ABDOMINAL SURGERY    . INCISION AND DRAINAGE PERIRECTAL ABSCESS N/A 06/11/2020   Procedure: IRRIGATION AND DEBRIDEMENT PERINEAL WOUND;  Surgeon: Fritzi Mandes, MD;  Location: [facility] OR;  Service: General;  Laterality: N/A;  . IRRIGATION AND DEBRIDEMENT ABSCESS N/A 06/10/2020   Procedure: IRRIGATION AND DEBRIDEMENT ABSCESS;  Surgeon: Fritzi Mandes, MD;  Location: [facility] OR;  Service: General;  Laterality: N/A;  . LAPAROSCOPIC GASTRIC SLEEVE RESECTION     HPI:  Per pulmonary note, "50 year old male who presented with buttock pain, fatigue and malaise.  Found to have hyperglycemia. CT pelvis demonstrated necrotizing fasciitis in L buttock, L upper thigh, and perineum. Taken to OR for debridement 1/10 c/b Afib with RVR and VT arrest s/p CPR/defib. Trached 1/26".  Pt was on full vent support until yesterday when he was put on trach collar.  Over night placed on vent but has been on trach collar all day today.  PMSV and swallow eval ordered.  Last CXR showed 1/31 IMPRESSION:  1. Shifting atelectasis, improved in the right upper lobe but  increased at the right base when compared to yesterday.  2. Visible hardware in unchanged position.   Assessment / Plan / Recommendation Clinical Impression  Pt seen with minimal po intake with PMSV in place and after pt brushed his  teeth with suction toothbrush.  Pt is able to conduct dry swallow and there are no focal CN deficits impacting swallowing.  His voice is mildly hoarse.  SLP observed pt consuming single ice chips.  With 2nd or 3rd ice chips, pt noted to demonstrate subtle cough -x1  ? mobilization of secretions in pharynx with expectoration of minimal secretions. Pt used caution and allowed ice chips to melt before swallowing with min verbal cues.  However after approximately the sixth ice chip, pt presented with increased coughing and multiple swallows concerning for laryngeal penetration/potential aspiration.  Multiple swallows and coughing increased in incidence - as po progressed which may indicate laryngeal penetration/aspiration and/or retention.  Fortunately pt's cough is strong and anticipate he will be appropriate for po diet soon.   However, given prolonged time of intubation and trach - recommend only ice chips after mouth care with PMSV in place. Educated pt to importance of oral care before ice chip consumption, coughing and expectorating if senses pharyngeal retention with ice and/or overtly reflexively coughing.  SLP will follow up for PMSV tx, dietary advancement readiness and/or FEES study.  Pt expressed gratitude for being able to consume ice chips. SLP Visit Diagnosis: Dysphagia, pharyngeal phase (R13.13);Dysphagia, unspecified (R13.10)    Aspiration Risk  Mild aspiration risk    Diet Recommendation Ice chips PRN after oral care   Medication Administration: Via alternative means Supervision: Patient able to self feed Compensations: Slow rate;Other (Comment) (cough and expectorate prn) Postural Changes: Other (Comment) (as upright  as able)    Other  Recommendations Oral Care Recommendations: Oral care prior to ice chip/H20   Follow up Recommendations Inpatient Rehab      Frequency and Duration min 1 x/week  2 weeks       Prognosis    Good for goals    Swallow Study   General Date of  Onset: 07/06/20 HPI: Per pulmonary note, "50 year old male who presented with buttock pain, fatigue and malaise.  Found to have hyperglycemia. CT pelvis demonstrated necrotizing fasciitis in L buttock, L upper thigh, and perineum. Taken to OR for debridement 1/10 c/b Afib with RVR and VT arrest s/p CPR/defib. Trached 1/26".  Pt was on full vent support until yesterday when he was put on trach collar.  Over night placed on vent but has been on trach collar all day today.  PMSV and swallow eval ordered.  Last CXR showed 1/31 IMPRESSION:  1. Shifting atelectasis, improved in the right upper lobe but  increased at the right base when compared to yesterday.  2. Visible hardware in unchanged position. Type of Study: Bedside Swallow Evaluation Previous Swallow Assessment: none in chart Diet Prior to this Study: NPO (Cortrak) Temperature Spikes Noted: No Respiratory Status: Trach Collar History of Recent Intubation: Yes Length of Intubations (days): 24 days Date extubated: 07/06/20 (2/4 - only night time vent to 2/5, trach placed 1/26) Behavior/Cognition: Alert;Cooperative;Pleasant mood Oral Cavity Assessment: Within Functional Limits Oral Care Completed by SLP: Other (Comment) (pt brushed his own teeth with the suctioning tooth brush) Oral Cavity - Dentition: Adequate natural dentition;Poor condition (appears with several decayed dentition - pt reports several teeth have broken) Vision: Functional for self-feeding Self-Feeding Abilities: Able to feed self;Needs assist Patient Positioning: Other (comment) (upright as much as able in a tilt bed, head in forward position) Baseline Vocal Quality: Other (comment) (mildly hoarse) Volitional Cough: Strong (able to expel secretions orally) Volitional Swallow: Able to elicit    Oral/Motor/Sensory Function Overall Oral Motor/Sensory Function: Within functional limits   Ice Chips Ice chips: Impaired Presentation: Spoon Pharyngeal Phase Impairments: Cough -  Delayed;Multiple swallows Other Comments: After approximately the sixth ice chip, pt presented with marginal coughing with minimal expectoration - concerning for laryngeal penetration/potential aspiration.  Pt was not observed to cough prior to ice chip offering and coughing increased in incidence as po progressed. - Given prolonged time of intubation and trach - recommend ice chips after mouth care with PMSV in place. SlP will follow up for readiness for po diet and/or FEES study.   Thin Liquid Thin Liquid: Not tested    Nectar Thick Nectar Thick Liquid: Not tested   Honey Thick Honey Thick Liquid: Not tested   Puree Puree: Not tested   Solid    Rolena Infante, MS [facility] SLP Acute Rehab Services Office [phone] Pager [phone]   Solid: Not tested      Chales Abrahams 07/06/2020,5:33 PM

## 2020-07-06 NOTE — Progress Notes (Signed)
ANTICOAGULATION CONSULT NOTE  Pharmacy Consult for Warfarin  Indication: atrial fibrillation  Patient Measurements: Height: 5\' 10"  (177.8 cm) Weight: (!) 268.9 kg (592 lb 13.1 oz) IBW/kg (Calculated) : 73 Heparin Dosing Weight: 149.7 kg  Vital Signs: Temp: 97.7 F (36.5 C) (02/05 0800) Temp Source: Axillary (02/05 0800) BP: 117/80 (02/05 0800) Pulse Rate: 103 (02/05 0749)  Labs: Recent Labs    07/04/20 0131 07/04/20 1251 07/04/20 1644 07/05/20 0303 07/06/20 0139  HGB 6.8* 7.7* 8.0*  --   --   HCT 23.9* 26.4* 27.9*  --   --   PLT 197  --  229  --   --   LABPROT 29.5*  --   --  36.1* 40.5*  INR 2.9*  --   --  3.8* 4.4*  CREATININE 1.18  --   --  1.14 1.10    Estimated Creatinine Clearance: 175.9 mL/min (by C-G formula based on SCr of 1.1 mg/dL).   Assessment: 49YOM admitted with septic shock secondary to necrotizing fascitis, on IV heparin for Afib. Was on xarelto PTA, but that is not appropriate based on patient's weight. Previously bridging warfarin with lovenox until INR therapeutic. LMWH level was elevated at 1.25 on 1/31 - dose was decreased to 225mg  q12hr. Patient with some sacral wound bleeding on 2/1 PM and received topical TXA/thrombi pads.   INR further jumped today to supratherapeutic INR (2.9 to 3.8>4.4). Will continue to hold warfarin today and consider dose reduction as appropriate.  Goal of Therapy:  INR 2 - 3 Monitor platelets by anticoagulation protocol: Yes   Plan:  Hold warfarin today Daily INR CBC  2/31, PharmD, [facility] Clinical Pharmacist Please see AMION for all Pharmacists' Contact Phone Numbers 07/06/2020, 10:50 AM

## 2020-07-06 NOTE — Evaluation (Signed)
Passy-Muir Speaking Valve - Evaluation Patient Details  Name: Carl Hill MRN: [id number] Date of Birth: 01-31-1971  Today's Date: 07/06/2020 Time: 1445-1530 SLP Time Calculation (min) (ACUTE ONLY): 45 min  Past Medical History:  Past Medical History:  Diagnosis Date  . 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 Asthma   . Atrial fibrillation (HCC)   . Diabetes mellitus without complication (HCC)   . History of cardioversion    3 times   . Hypertension   . Sepsis due to Streptococcus, group B (HCC) 06/22/2020   Past Surgical History:  Past Surgical History:  Procedure Laterality Date  . ABDOMINAL SURGERY    . INCISION AND DRAINAGE PERIRECTAL ABSCESS N/A 06/11/2020   Procedure: IRRIGATION AND DEBRIDEMENT PERINEAL WOUND;  Surgeon: Carl Mandes, MD;  Location: [facility] OR;  Service: General;  Laterality: N/A;  . IRRIGATION AND DEBRIDEMENT ABSCESS N/A 06/10/2020   Procedure: IRRIGATION AND DEBRIDEMENT ABSCESS;  Surgeon: Carl Mandes, MD;  Location: [facility] OR;  Service: General;  Laterality: N/A;  . LAPAROSCOPIC GASTRIC SLEEVE RESECTION     HPI:  Per pulmonary note, "50 year old male who presented with buttock pain, fatigue and malaise.  Found to have hyperglycemia. CT pelvis demonstrated necrotizing fasciitis in L buttock, L upper thigh, and perineum. Taken to OR for debridement 1/10 c/b Afib with RVR and VT arrest s/p CPR/defib. Trached 1/26".  Pt was on full vent support until yesterday when he was put on trach collar.  Over night placed on vent but has been on trach collar all day today.  PMSV and swallow eval ordered.  Last CXR showed 1/31 IMPRESSION:  1. Shifting atelectasis, improved in the right upper lobe but  increased at the right base when compared to yesterday.  2. Visible hardware in unchanged position.   Assessment / Plan / Recommendation Clinical Impression  Pt sitting as upright as able to tolerate and fully alert.  RN Carl Hill deflated pt's cuff with no demonstration of difficulties from  the pt.  Excellent airflow and voicing noted with PMSV placement during entire session of approximately one hour.  Pt without air retention with multiple doffing of valve.  His voice was mildly hoarse but improved as session continued.  All vitals remained stable during trials and pt able to communicate in up to 5 word sentences.  Pt phoned his mother per his request and expressed gratitude at being able to speak. Upon doffing of PMSV, pt observed to achieve phonation, indicative of adequate upper airflow and laryngeal integrity.  Recoommend intially full supervision with PMSV in place but hope to transition pt to using throughout the day rapidly.  Educated pt to use of valve, benefits of valve and contraindications using teach back.  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 SLP Visit Diagnosis: Aphonia (R49.1)    SLP Assessment  Patient needs continued Speech Lanaguage Pathology Services    Follow Up Recommendations  Inpatient Rehab    Frequency and Duration min 1 x/week  1 week    PMSV Trial PMSV was placed for: approx one hour Able to redirect subglottic air through upper airway: Yes Able to Attain Phonation: Yes Voice Quality: Low vocal intensity;Hoarse (occasionally hoarse and wet) Able to Expectorate Secretions: Yes Level of Secretion Expectoration with PMSV: Oral Breath Support for Phonation: Adequate Intelligibility: Intelligible Respirations During Trial: 14 SpO2 During Trial: 98 % Pulse During Trial: 107 Behavior: Alert;Good eye contact;Expresses self well;Responsive to questions   Tracheostomy Tube  Additional  Tracheostomy Tube Assessment Fenestrated: No Trach Collar Period: on trach collar all day 2/5 and portion of the day 2/4    Vent Dependency  On Trach Collar   Cuff Deflation Trial  GO Tolerated Cuff Deflation: Yes Length of Time for Cuff Deflation Trial: approx one hour - cuff was deflated approx 5 minutes before  valve was placed Behavior: Alert;Responsive to questions;Cooperative;Expresses self well;Good eye contact        Carl Hill 07/06/2020, 5:13 PM Carl Infante, MS [facility] SLP Acute Rehab Services Office [phone] Pager [phone]

## 2020-07-06 NOTE — Progress Notes (Signed)
NAME:  Carl Hill, MRN:  [id number], DOB:  06/22/1970, LOS: 24 ADMISSION DATE:  06/10/2020, CONSULTATION DATE:  06/10/2020 REFERRING MD:  Lorin Mercy, CHIEF COMPLAINT:  Severe Sepsis    Brief History:  50 year old male who presented with buttock pain, fatigue and malaise.  Found to have hyperglycemia. CT pelvis demonstrated necrotizing fasciitis in L buttock, L upper thigh, and perineum. Taken to OR for debridement 1/10 c/b Afib with RVR and VT arrest s/p CPR/defib. Trached 1/26, remains on full vent support.  Past Medical History:  HTN, obesity, DM, AFib on DOAC  Significant Hospital Events:  1/10  Admitted to [facility]. General surgery consulted and debridement pursued. In the afternoon, patient developed hypotension with AF in RVR. Synchronized cardioversion unsuccessful with conversion to VT. CPR initated with ROSC after desynchronized defibrillation. 1/11 Debridement 1/18 Mucus plugging 1/19 Bronchoscopy >> mucus plug on Rt, airway collapse with exhalation 1/20 Persistent fever, change ABx 1/24 Increased vent requirements, habitus related +/- mucus plugging 1/25 Hypotensive to SBP 80s, Levo resumed, minimally responsive overnight off sedation, CT Head negative 1/26 Stable vent/pressor requirements, bronched, tracheostomy 1/29 start hydrotherapy 1/30 start lovenox/coumadin 2/2 Tolerating PS 2/4 did TC for 12 hours 2/5 plan 24 hours TC  Consults:  General Surgery ID s/o 1/25  Procedures:  ETT 1/10 >>  R IJ CVL 1/10 >>  L brachial A-line 1/10 >>   Significant Diagnostic Tests:   CT abd/pelvis 1/10 >> Extensive subcutaneous emphysema in the medial aspect of the upper thighs extending through perineum and superiorly to the medial left buttock. Gas extends into the pelvis surrounding the anus inferiorly and tracking superiorly along the left wall of the rectum.  CT C/A/P 1/22 >> Imaging limited due to body habitus. No gross fluid collection or gas seen. Bilateral consolidation with  dependent atelectasis  CT Head 1/25 >> No acute intracranial abnormality  Micro Data:  COVID/Flu 1/10 >> negative MRSA PCR 1/10 >> negative L buttocks wound 1/10 >> E. Coli (pansensitive), Streptococcus infantarious (sensitive to PCN, ceftriaxone, vanc), Peptostreptococcus species, rare GBS Blood 1/10 >> negative BAL 1/20 >> normal flora Resp Cx 1/26 > Normal flora  Antimicrobials:  Aztreonam 1/09 Metronidazole 1/09 Vancomycin 1/09, 1/11, 1/20 >>1/22 Cefepime 1/10 >> 1/13, 1/20 >>1/22 Clindamycin 1/10 >> 1/13 Ceftriaxone 1/13 >> 1/20 Metronidazole 1/17 >> 1/22 Meropenem 1/22 >> 1/25 Amoxicillin 1/24 (x 1 for PCN Allergy challenge) Unasyn 1/25 >> 1/27 Augmentin 1/27 >>  Interim History / Subjective:  Tolerating TC this AM once again   Objective   Blood pressure 117/80, pulse (!) 103, temperature 97.7 F (36.5 C), temperature source Axillary, resp. rate 14, height _0  (1.778 m), weight (!) 268.9 kg, SpO2 96 %.    Vent Mode: PCV FiO2 (%):  [40 %] 40 % Set Rate:  [18 bmp] 18 bmp PEEP:  [5 cmH20] 5 cmH20 Plateau Pressure:  [16 cmH20] 16 cmH20   Intake/Output Summary (Last 24 hours) at 07/06/2020 1005 Last data filed at 07/06/2020 5726 Gross per 24 hour  Intake 1930 ml  Output 3110 ml  Net -1180 ml   Filed Weights   06/30/20 0500 07/02/20 0500 07/06/20 0200  Weight: (!) 258.6 kg (!) 264.4 kg (!) 268.9 kg   Physical Exam: General: Morbidly obese, no acute distress HENT: [place], AT, OP clear, MMM Neck: Trach in place, c/d/i Eyes: EOMI, no scleral icterus Respiratory: Clear to auscultation bilaterally.  No crackles, wheezing or rales Cardiovascular: RRR, -M/R/G, no JVD GI: BS+, soft, nontender Neuro: AAO x4,  CNII-XII grossly intact Psych: Normal mood, normal affect GU: Foley in place   Resolved problems:  VT arrest 1/10, Ileus 1/15, Elevated LFTs from shock, Metabolic acidosis with lactic acidosis, Septic shock Acute metabolic encephalopathy 2nd to sepsis, hypoxia,  hypercapnia. 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 Bronchomalacia. Probable sleep disordered breathing. -  TC as tolerated, trial 24 hours - Pulmonary hygiene with nebulizers   - Routine trach care - Will eventually need LTAC vs AIR if he has insurance benefits for this  Left buttock necrotizing fasciitis with E coli, Group B Streptococcus, and Peptostreptococcus in wound culture. Bleeding from sacral site - no further issue - Hydrotherapy 1/29-2/3 - Surgery following. No further debridement indicated - Long course of antibiotics, currently on augmentin - Thrombi pads and TMA soaked dressing for bleed  Hypernatremia from diuresis and insensible losses - improved - Continue free water - Trend  Acute blood loss anemia, likely from frequent blood draws - S/p 1 U PRBC on 1/31 and 2/3. Appropriate Hg response to transfusion - Trend CBC   Paroxysmal Afib - currently NSR Hx of HLD. - continue amiodarone, lipitor - Continue lovenox/coumadin per pharmacy. 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 - Continue Lyrica/nortriptyline  Anemia of critical illness. - f/u CBC - transfuse for Hb < 7 or significant bleeding  Afib: --warfarin (on hold, elevated INR)  Best practice (evaluated daily)  Diet: Continue TF DVT prophylaxis: lovenox/coumadin GI prophylaxis: Protonix  Mobility: Bedrest Disposition: ICU Family:  Code status: Full Code  Labs    CMP Latest Ref Rng & Units 07/06/2020 07/05/2020 07/04/2020  Glucose 70 - 99 mg/dL 203(H) 215(H) 227(H)  BUN 6 - 20 mg/dL 40(H) 41(H) 43(H)  Creatinine 0.61 - 1.24 mg/dL 1.10 1.14 1.18  Sodium 135 - 145 mmol/L 144 146(H) 144  Potassium 3.5 - 5.1 mmol/L 4.1 3.5 3.6   Chloride 98 - 111 mmol/L 105 105 104  CO2 22 - 32 mmol/L _0 Calcium 8.9 - 10.3 mg/dL 8.6(L) 8.8(L) 8.1(L)  Total Protein 6.5 - 8.1 g/dL - - -  Total Bilirubin 0.3 - 1.2 mg/dL - - -  Alkaline Phos 38 - 126 U/L - - -  AST 15 - 41 U/L - - -  ALT 0 - 44 U/L - - -    CBC Latest Ref Rng & Units 07/04/2020 07/04/2020 07/04/2020  WBC 4.0 - 10.5 K/uL 9.6 - 8.1  Hemoglobin 13.0 - 17.0 g/dL 8.0(L) 7.7(L) 6.8(LL)  Hematocrit 39.0 - 52.0 % 27.9(L) 26.4(L) 23.9(L)  Platelets 150 - 400 K/uL 229 - 197   ABG    Component Value Date/Time   PHART 7.401 06/23/2020 1204   PCO2ART 57.4 (H) 06/23/2020 1204   PO2ART 73 (L) 06/23/2020 1204   HCO3 35.5 (H) 06/23/2020 1204   TCO2 37 (H) 06/23/2020 1204   ACIDBASEDEF 2.0 06/13/2020 1221   O2SAT 94.0 06/23/2020 1204    CBG (last 3)  Recent Labs    07/06/20 0339 07/06/20 0803 07/06/20 0812  GLUCAP 170* 193* 196*   Signature:   CRITICAL CARE Performed by: Bonna Gains Yancy Knoble   Total critical care time: 33 minutes  Critical care time was exclusive of separately billable procedures and treating other patients.  Critical care was necessary  to treat or prevent imminent or life-threatening deterioration.  Critical care was time spent personally by me on the following activities: development of treatment plan with patient and/or surrogate as well as nursing, discussions with consultants, evaluation of patient's response to treatment, examination of patient, obtaining history from patient or surrogate, ordering and performing treatments and interventions, ordering and review of laboratory studies, ordering and review of radiographic studies, pulse oximetry and re-evaluation of patient's condition.   Lanier Clam, MD

## 2020-07-07 DIAGNOSIS — N493 Fournier gangrene: Secondary | ICD-10-CM | POA: Diagnosis not present

## 2020-07-07 LAB — CBC
HCT: 30.1 % — ABNORMAL LOW (ref 39.0–52.0)
Hemoglobin: 8.5 g/dL — ABNORMAL LOW (ref 13.0–17.0)
MCH: 27.6 pg (ref 26.0–34.0)
MCHC: 28.2 g/dL — ABNORMAL LOW (ref 30.0–36.0)
MCV: 97.7 fL (ref 80.0–100.0)
Platelets: 283 K/uL (ref 150–400)
RBC: 3.08 MIL/uL — ABNORMAL LOW (ref 4.22–5.81)
RDW: 17.2 % — ABNORMAL HIGH (ref 11.5–15.5)
WBC: 9 K/uL (ref 4.0–10.5)
nRBC: 0.3 % — ABNORMAL HIGH (ref 0.0–0.2)

## 2020-07-07 LAB — BASIC METABOLIC PANEL WITH GFR
Anion gap: 11 (ref 5–15)
BUN: 36 mg/dL — ABNORMAL HIGH (ref 6–20)
CO2: 27 mmol/L (ref 22–32)
Calcium: 8.4 mg/dL — ABNORMAL LOW (ref 8.9–10.3)
Chloride: 104 mmol/L (ref 98–111)
Creatinine, Ser: 1.04 mg/dL (ref 0.61–1.24)
GFR, Estimated: >60 mL/min
Glucose, Bld: 234 mg/dL — ABNORMAL HIGH (ref 70–99)
Potassium: 4.2 mmol/L (ref 3.5–5.1)
Sodium: 142 mmol/L (ref 135–145)

## 2020-07-07 LAB — PROTIME-INR
INR: 4.1 (ref 0.8–1.2)
Prothrombin Time: 38.5 seconds — ABNORMAL HIGH (ref 11.4–15.2)

## 2020-07-07 LAB — GLUCOSE, CAPILLARY
Glucose-Capillary: 191 mg/dL — ABNORMAL HIGH (ref 70–99)
Glucose-Capillary: 189 mg/dL — ABNORMAL HIGH (ref 70–99)
Glucose-Capillary: 191 mg/dL — ABNORMAL HIGH (ref 70–99)
Glucose-Capillary: 187 mg/dL — ABNORMAL HIGH (ref 70–99)
Glucose-Capillary: 186 mg/dL — ABNORMAL HIGH (ref 70–99)

## 2020-07-07 MED ORDER — FENTANYL CITRATE (PF) 100 MCG/2ML IJ SOLN
50.0000 ug | Freq: Once | INTRAMUSCULAR | Status: AC
  Administered 2020-07-07: 50 ug via INTRAVENOUS

## 2020-07-07 NOTE — Progress Notes (Signed)
ANTICOAGULATION CONSULT NOTE  Pharmacy Consult for Warfarin  Indication: atrial fibrillation  Patient Measurements: Height: 5\' 10"  (177.8 cm) Weight: (!) 239.7 kg (528 lb 7.1 oz) IBW/kg (Calculated) : 73 Heparin Dosing Weight: 149.7 kg  Vital Signs: Temp: 97.6 F (36.4 C) (02/06 0700) Temp Source: Axillary (02/06 0700) BP: 128/83 (02/06 0800) Pulse Rate: 95 (02/06 0803)  Labs: Recent Labs    07/04/20 1251 07/04/20 1644 07/05/20 0303 07/06/20 0139 07/07/20 0410  HGB 7.7* 8.0*  --   --   --   HCT 26.4* 27.9*  --   --   --   PLT  --  229  --   --   --   LABPROT  --   --  36.1* 40.5* 38.5*  INR  --   --  3.8* 4.4* 4.1*  CREATININE  --   --  1.14 1.10  --     Estimated Creatinine Clearance: 162.3 mL/min (by C-G formula based on SCr of 1.1 mg/dL).   Assessment: 49YOM admitted with septic shock secondary to necrotizing fascitis, on IV heparin for Afib. Was on xarelto PTA, but that is not appropriate based on patient's weight. Previously bridging warfarin with lovenox until INR therapeutic. LMWH level was elevated at 1.25 on 1/31 - dose was decreased to 225mg  q12hr. Patient with some sacral wound bleeding on 2/1 PM and received topical TXA/thrombi pads.   INR trending down today still supratherapeutic INR (2.9> 3.8>4.4>4.1). Will continue to hold warfarin today and consider dose reduction as appropriate.  Goal of Therapy:  INR 2 - 3 Monitor platelets by anticoagulation protocol: Yes   Plan:  Hold warfarin today Daily INR CBC  2/31, PharmD, [facility] Clinical Pharmacist Please see AMION for all Pharmacists' Contact Phone Numbers 07/07/2020, 10:42 AM

## 2020-07-07 NOTE — Progress Notes (Signed)
NAME:  Carl Hill, MRN:  [id number], DOB:  [birth year], LOS: 52 ADMISSION DATE:  06/10/2020, CONSULTATION DATE:  06/10/2020 REFERRING MD:  Lorin Mercy, CHIEF COMPLAINT:  Severe Sepsis    Brief History:  50 year old male who presented with buttock pain, fatigue and malaise.  Found to have hyperglycemia. CT pelvis demonstrated necrotizing fasciitis in L buttock, L upper thigh, and perineum. Taken to OR for debridement 1/10 c/b Afib with RVR and VT arrest s/p CPR/defib. Trached 1/26, remains on full vent support.  Past Medical History:  HTN, obesity, DM, AFib on DOAC  Significant Hospital Events:  1/10  Admitted to [facility]. General surgery consulted and debridement pursued. In the afternoon, patient developed hypotension with AF in RVR. Synchronized cardioversion unsuccessful with conversion to VT. CPR initated with ROSC after desynchronized defibrillation. 1/11 Debridement 1/18 Mucus plugging 1/19 Bronchoscopy >> mucus plug on Rt, airway collapse with exhalation 1/20 Persistent fever, change ABx 1/24 Increased vent requirements, habitus related +/- mucus plugging 1/25 Hypotensive to SBP 80s, Levo resumed, minimally responsive overnight off sedation, CT Head negative 1/26 Stable vent/pressor requirements, bronched, tracheostomy 1/29 start hydrotherapy 1/30 start lovenox/coumadin 2/2 Tolerating PS 2/4 did TC for 12 hours 2/5 Did 24 hours TC 2/6 Continue TC   Consults:  General Surgery ID s/o 1/25  Procedures:  ETT 1/10 >>  R IJ CVL 1/10 >>  L brachial A-line 1/10 >>   Significant Diagnostic Tests:   CT abd/pelvis 1/10 >> Extensive subcutaneous emphysema in the medial aspect of the upper thighs extending through perineum and superiorly to the medial left buttock. Gas extends into the pelvis surrounding the anus inferiorly and tracking superiorly along the left wall of the rectum.  CT C/A/P 1/22 >> Imaging limited due to body habitus. No gross fluid collection or gas seen. Bilateral  consolidation with dependent atelectasis  CT Head 1/25 >> No acute intracranial abnormality  Micro Data:  COVID/Flu 1/10 >> negative MRSA PCR 1/10 >> negative L buttocks wound 1/10 >> E. Coli (pansensitive), Streptococcus infantarious (sensitive to PCN, ceftriaxone, vanc), Peptostreptococcus species, rare GBS Blood 1/10 >> negative BAL 1/20 >> normal flora Resp Cx 1/26 > Normal flora  Antimicrobials:  Aztreonam 1/09 Metronidazole 1/09 Vancomycin 1/09, 1/11, 1/20 >>1/22 Cefepime 1/10 >> 1/13, 1/20 >>1/22 Clindamycin 1/10 >> 1/13 Ceftriaxone 1/13 >> 1/20 Metronidazole 1/17 >> 1/22 Meropenem 1/22 >> 1/25 Amoxicillin 1/24 (x 1 for PCN Allergy challenge) Unasyn 1/25 >> 1/27 Augmentin 1/27 >>  Interim History / Subjective:  Tolerating TC, did well on TC overnight   Objective   Blood pressure 128/83, pulse 95, temperature 97.6 F (36.4 C), temperature source Axillary, resp. rate 11, height _0  (1.778 m), weight (!) 239.7 kg, SpO2 95 %.    FiO2 (%):  [40 %] 40 %   Intake/Output Summary (Last 24 hours) at 07/07/2020 1103 Last data filed at 07/07/2020 0800 Gross per 24 hour  Intake 2355 ml  Output 3515 ml  Net -1160 ml   Filed Weights   07/02/20 0500 07/06/20 0200 07/07/20 0407  Weight: (!) 264.4 kg (!) 268.9 kg (!) 239.7 kg   Physical Exam: General: Morbidly obese, no acute distress HENT: [place], AT, OP clear, MMM Neck: Trach in place, c/d/i Eyes: EOMI, no scleral icterus Respiratory: Clear to auscultation bilaterally.  No crackles, wheezing or rales Cardiovascular: RRR, -M/R/G, no JVD GI: BS+, soft, nontender Neuro: AAO x4, CNII-XII grossly intact Psych: Normal mood, normal affect GU: Foley in place   Resolved problems:  VT arrest  1/10, Ileus 1/15, Elevated LFTs from shock, Metabolic acidosis with lactic acidosis, Septic shock Acute metabolic encephalopathy 2nd to sepsis, hypoxia, hypercapnia. Assessment & Plan:   Acute on chronic hypoxic/hypercapnic respiratory  failure in setting of sepsis, HCAP, recurrent mucus plugging -improving Failure to wean s/p tracheostomy. Bronchomalacia. Probable sleep disordered breathing. -  TC as tolerated, continuous trial going well - Pulmonary hygiene with nebulizers   - Routine trach care - Will eventually need LTAC vs AIR if he has insurance benefits for this  Left buttock necrotizing fasciitis with E coli, Group B Streptococcus, and Peptostreptococcus in wound culture. Bleeding from sacral site - no further issue - Hydrotherapy 1/29-2/3 - Surgery following. No further debridement indicated - Long course of antibiotics, currently on augmentin - Thrombi pads and TMA soaked dressing for bleed  Hypernatremia from diuresis and insensible losses - improved - Continue free water - Trend  Acute blood loss anemia, likely from frequent blood draws - S/p 1 U PRBC on 1/31 and 2/3. Appropriate Hg response to transfusion - Trend CBC   Paroxysmal Afib - currently NSR Hx of HLD. - continue amiodarone, lipitor - coumadin per pharmacy. 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 - Continue Lyrica/nortriptyline  Anemia of critical illness. - f/u CBC - transfuse for Hb < 7 or significant bleeding  Afib: --warfarin (on hold, elevated INR)  Best practice (evaluated daily)  Diet: Continue TF DVT prophylaxis: lovenox/coumadin GI prophylaxis: Protonix  Mobility: Bedrest Disposition: ICU Family:  Code status: Full Code  Labs    CMP Latest Ref Rng & Units 07/07/2020 07/06/2020 07/05/2020  Glucose 70 - 99 mg/dL 234(H) 203(H) 215(H)  BUN 6 - 20 mg/dL 36(H) 40(H) 41(H)  Creatinine 0.61 - 1.24 mg/dL 1.04 1.10 1.14  Sodium 135 - 145 mmol/L 142 144 146(H)  Potassium 3.5 - 5.1 mmol/L 4.2 4.1 3.5  Chloride 98 - 111 mmol/L 104 105 105  CO2 22 - 32  mmol/L _0 Calcium 8.9 - 10.3 mg/dL 8.4(L) 8.6(L) 8.8(L)  Total Protein 6.5 - 8.1 g/dL - - -  Total Bilirubin 0.3 - 1.2 mg/dL - - -  Alkaline Phos 38 - 126 U/L - - -  AST 15 - 41 U/L - - -  ALT 0 - 44 U/L - - -    CBC Latest Ref Rng & Units 07/07/2020 07/04/2020 07/04/2020  WBC 4.0 - 10.5 K/uL 9.0 9.6 -  Hemoglobin 13.0 - 17.0 g/dL 8.5(L) 8.0(L) 7.7(L)  Hematocrit 39.0 - 52.0 % 30.1(L) 27.9(L) 26.4(L)  Platelets 150 - 400 K/uL 283 229 -   ABG    Component Value Date/Time   PHART 7.401 06/23/2020 1204   PCO2ART 57.4 (H) 06/23/2020 1204   PO2ART 73 (L) 06/23/2020 1204   HCO3 35.5 (H) 06/23/2020 1204   TCO2 37 (H) 06/23/2020 1204   ACIDBASEDEF 2.0 06/13/2020 1221   O2SAT 94.0 06/23/2020 1204    CBG (last 3)  Recent Labs    07/06/20 2006 07/07/20 0345 07/07/20 0746  GLUCAP 194* 191* 189*   Signature:   CRITICAL CARE Performed by: Bonna Gains Sipriano Fendley   Total critical care time: 31 minutes  Critical care time was exclusive of separately billable procedures and treating other patients.  Critical care was necessary to treat or prevent imminent or life-threatening deterioration.  Critical care was time spent  personally by me on the following activities: development of treatment plan with patient and/or surrogate as well as nursing, discussions with consultants, evaluation of patient's response to treatment, examination of patient, obtaining history from patient or surrogate, ordering and performing treatments and interventions, ordering and review of laboratory studies, ordering and review of radiographic studies, pulse oximetry and re-evaluation of patient's condition.   Lanier Clam, MD

## 2020-07-07 NOTE — Progress Notes (Signed)
eLink Physician-Brief Progress Note Patient Name: Carl Hill DOB: 08-03-1970 MRN: [id number]   Date of Service  07/07/2020  HPI/Events of Note  RN requesting a one time order for Fentanyl prn for pain after changing wound dressing, pt had before procedure, but needs something more  eICU Interventions  100 microgram was given at 1 am, will write for 50  Microgram now Already has 100 microgram q2h PRN order in  D/w RN      Intervention Category Intermediate Interventions: Pain - evaluation and management  Sallye Lunz G Dalton Molesworth 07/07/2020, 2:12 AM

## 2020-07-07 NOTE — Progress Notes (Signed)
CRITICAL VALUE ALERT  Critical Value:  INR 4.1  Date & Time Notied:  07/07/2020   0710  Provider Notified:   Orders Received/Actions taken:

## 2020-07-08 DIAGNOSIS — N493 Fournier gangrene: Secondary | ICD-10-CM | POA: Diagnosis not present

## 2020-07-08 LAB — BASIC METABOLIC PANEL
Anion gap: 12 (ref 5–15)
BUN: 37 mg/dL — ABNORMAL HIGH (ref 6–20)
CO2: 26 mmol/L (ref 22–32)
Calcium: 8.7 mg/dL — ABNORMAL LOW (ref 8.9–10.3)
Chloride: 106 mmol/L (ref 98–111)
Creatinine, Ser: 0.93 mg/dL (ref 0.61–1.24)
GFR, Estimated: >60 mL/min (ref >60)
Glucose, Bld: 219 mg/dL — ABNORMAL HIGH (ref 70–99)
Potassium: 4.2 mmol/L (ref 3.5–5.1)
Sodium: 144 mmol/L (ref 135–145)

## 2020-07-08 LAB — CBC
HCT: 29 % — ABNORMAL LOW (ref 39.0–52.0)
Hemoglobin: 8.6 g/dL — ABNORMAL LOW (ref 13.0–17.0)
MCH: 28.4 pg (ref 26.0–34.0)
MCHC: 29.7 g/dL — ABNORMAL LOW (ref 30.0–36.0)
MCV: 95.7 fL (ref 80.0–100.0)
Platelets: 301 10*3/uL (ref 150–400)
RBC: 3.03 MIL/uL — ABNORMAL LOW (ref 4.22–5.81)
RDW: 17.1 % — ABNORMAL HIGH (ref 11.5–15.5)
WBC: 9.2 10*3/uL (ref 4.0–10.5)
nRBC: 0.2 % (ref 0.0–0.2)

## 2020-07-08 LAB — GLUCOSE, CAPILLARY
Glucose-Capillary: 170 mg/dL — ABNORMAL HIGH (ref 70–99)
Glucose-Capillary: 181 mg/dL — ABNORMAL HIGH (ref 70–99)
Glucose-Capillary: 192 mg/dL — ABNORMAL HIGH (ref 70–99)
Glucose-Capillary: 193 mg/dL — ABNORMAL HIGH (ref 70–99)
Glucose-Capillary: 206 mg/dL — ABNORMAL HIGH (ref 70–99)
Glucose-Capillary: 231 mg/dL — ABNORMAL HIGH (ref 70–99)

## 2020-07-08 LAB — PROTIME-INR
INR: 3.5 — ABNORMAL HIGH (ref 0.8–1.2)
Prothrombin Time: 34 seconds — ABNORMAL HIGH (ref 11.4–15.2)

## 2020-07-08 MED ORDER — HYDROMORPHONE HCL 1 MG/ML IJ SOLN
1.0000 mg | Freq: Three times a day (TID) | INTRAMUSCULAR | Status: DC | PRN
  Administered 2020-07-08 – 2020-07-09 (×3): 1 mg via INTRAVENOUS
  Filled 2020-07-08 (×3): qty 1

## 2020-07-08 MED ORDER — SENNOSIDES 8.8 MG/5ML PO SYRP
10.0000 mL | ORAL_SOLUTION | Freq: Two times a day (BID) | ORAL | Status: DC | PRN
  Filled 2020-07-08: qty 10

## 2020-07-08 MED ORDER — FENTANYL CITRATE (PF) 100 MCG/2ML IJ SOLN
50.0000 ug | Freq: Once | INTRAMUSCULAR | Status: AC
  Administered 2020-07-08: 50 ug via INTRAVENOUS
  Filled 2020-07-08: qty 2

## 2020-07-08 MED ORDER — FENTANYL CITRATE (PF) 100 MCG/2ML IJ SOLN: 100.0000 ug | Freq: Four times a day (QID) | INTRAMUSCULAR | Status: DC | PRN

## 2020-07-08 MED ORDER — INSULIN ASPART 100 UNIT/ML ~~LOC~~ SOLN
10.0000 [IU] | SUBCUTANEOUS | Status: DC
  Administered 2020-07-08 – 2020-07-09 (×6): 10 [IU] via SUBCUTANEOUS

## 2020-07-08 MED ORDER — DOCUSATE SODIUM 50 MG/5ML PO LIQD
100.0000 mg | Freq: Two times a day (BID) | ORAL | Status: DC | PRN
  Filled 2020-07-08: qty 10

## 2020-07-08 MED ORDER — FREE WATER
200.0000 mL | Status: DC
  Administered 2020-07-08 – 2020-07-10 (×12): 200 mL

## 2020-07-08 MED ORDER — POLYETHYLENE GLYCOL 3350 17 G PO PACK: 17.0000 g | PACK | Freq: Every day | ORAL | Status: DC | PRN

## 2020-07-08 NOTE — Progress Notes (Signed)
NAME:  Carl Hill, MRN:  [id number], DOB:  11/28/1970, LOS: 64 ADMISSION DATE:  06/10/2020, CONSULTATION DATE:  06/10/2020 REFERRING MD:  Lorin Mercy, CHIEF COMPLAINT:  Severe Sepsis    Brief History:  50 year old male who presented with buttock pain, fatigue and malaise.  Found to have hyperglycemia. CT pelvis demonstrated necrotizing fasciitis in L buttock, L upper thigh, and perineum. Taken to OR for debridement 1/10 c/b Afib with RVR and VT arrest s/p CPR/defib. Trached 1/26, remains on full vent support.  Past Medical History:  HTN, obesity, DM, AFib on DOAC  Significant Hospital Events:  1/10  Admitted to [facility]. General surgery consulted and debridement pursued. In the afternoon, patient developed hypotension with AF in RVR. Synchronized cardioversion unsuccessful with conversion to VT. CPR initated with ROSC after desynchronized defibrillation. 1/11 Debridement 1/18 Mucus plugging 1/19 Bronchoscopy >> mucus plug on Rt, airway collapse with exhalation 1/20 Persistent fever, change ABx 1/24 Increased vent requirements, habitus related +/- mucus plugging 1/25 Hypotensive to SBP 80s, Levo resumed, minimally responsive overnight off sedation, CT Head negative 1/26 Stable vent/pressor requirements, bronched, tracheostomy 1/29 start hydrotherapy 1/30 start lovenox/coumadin 2/2 Tolerating PS 2/4 did TC for 12 hours 2/5 Did 24 hours TC 2/6 Continue TC  2/7 Try dilaudid with turns/dressing change for better pain control - longer acting, fentanyl IV d/c'd  Consults:  General Surgery ID s/o 1/25  Procedures:  ETT 1/10 >>  R IJ CVL 1/10 >>  L brachial A-line 1/10 >>   Significant Diagnostic Tests:   CT abd/pelvis 1/10 >> Extensive subcutaneous emphysema in the medial aspect of the upper thighs extending through perineum and superiorly to the medial left buttock. Gas extends into the pelvis surrounding the anus inferiorly and tracking superiorly along the left wall of the rectum.  CT  C/A/P 1/22 >> Imaging limited due to body habitus. No gross fluid collection or gas seen. Bilateral consolidation with dependent atelectasis  CT Head 1/25 >> No acute intracranial abnormality  Micro Data:  COVID/Flu 1/10 >> negative MRSA PCR 1/10 >> negative L buttocks wound 1/10 >> E. Coli (pansensitive), Streptococcus infantarious (sensitive to PCN, ceftriaxone, vanc), Peptostreptococcus species, rare GBS Blood 1/10 >> negative BAL 1/20 >> normal flora Resp Cx 1/26 > Normal flora  Antimicrobials:  Aztreonam 1/09 Metronidazole 1/09 Vancomycin 1/09, 1/11, 1/20 >>1/22 Cefepime 1/10 >> 1/13, 1/20 >>1/22 Clindamycin 1/10 >> 1/13 Ceftriaxone 1/13 >> 1/20 Metronidazole 1/17 >> 1/22 Meropenem 1/22 >> 1/25 Amoxicillin 1/24 (x 1 for PCN Allergy challenge) Unasyn 1/25 >> 1/27 Augmentin 1/27 >>  Interim History / Subjective:  TC last 48 hours. Thirsty. Has pain after turns, fentanyl wears off early.   Objective   Blood pressure 127/71, pulse (!) 107, temperature 98.2 F (36.8 C), temperature source Axillary, resp. rate 11, height _0  (1.778 m), weight (!) 239.7 kg, SpO2 94 %.    FiO2 (%):  [35 %-60 %] 60 %   Intake/Output Summary (Last 24 hours) at 07/08/2020 1210 Last data filed at 07/08/2020 1150 Gross per 24 hour  Intake 2395 ml  Output 3170 ml  Net -775 ml   Filed Weights   07/06/20 0200 07/07/20 0407 07/08/20 0500  Weight: (!) 268.9 kg (!) 239.7 kg (!) 239.7 kg   Physical Exam: General: Morbidly obese, no acute distress HENT: [place], AT, OP clear, MMM, speaks with PMV Neck: Trach in place, c/d/i Eyes: EOMI, no scleral icterus Respiratory: Clear to auscultation bilaterally.  No crackles, wheezing or rales Cardiovascular: RRR, -M/R/G, no  JVD GI: BS+, soft, nontender Neuro: AAO x4, CNII-XII grossly intact Psych: Normal mood, normal affect GU: Foley in place   Resolved problems:  VT arrest 1/10, Ileus 1/15, Elevated LFTs from shock, Metabolic acidosis with lactic  acidosis, Septic shock Acute metabolic encephalopathy 2nd to sepsis, hypoxia, hypercapnia. 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 Bronchomalacia. Probable sleep disordered breathing. 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 Bleeding from sacral site - no further issue - Hydrotherapy 1/29-2/3 - Surgery following. No further debridement indicated - Long course of antibiotics, currently on augmentin (6 month plan) - Thrombi pads and TMA soaked dressing for bleed  Hypernatremia from diuresis and insensible losses - improved - Increase free water 2/7 with thirst - Trend  Acute blood loss anemia, likely from frequent blood draws - S/p 1 U PRBC on 1/31 and 2/3. Appropriate Hg response to transfusion - Trend CBC   Paroxysmal Afib - currently NSR Hx of HLD. - continue amiodarone, lipitor - coumadin per pharmacy. 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 - Continue Lyrica/nortriptyline  Anemia of critical illness. - f/u CBC - transfuse for Hb < 7 or significant bleeding  Afib: -warfarin (on hold, elevated INR)  Best practice (evaluated daily)  Diet: Continue TF DVT prophylaxis: lovenox/coumadin GI prophylaxis: Protonix  Mobility: Bedrest Disposition: ICU Family:  Code status: Full Code  Labs    CMP Latest Ref Rng & Units 07/08/2020 07/07/2020 07/06/2020  Glucose 70 - 99 mg/dL 219(H) 234(H) 203(H)  BUN 6 - 20 mg/dL 37(H) 36(H) 40(H)  Creatinine 0.61 - 1.24  mg/dL 0.93 1.04 1.10  Sodium 135 - 145 mmol/L 144 142 144  Potassium 3.5 - 5.1 mmol/L 4.2 4.2 4.1  Chloride 98 - 111 mmol/L 106 104 105  CO2 22 - 32 mmol/L _0 Calcium 8.9 - 10.3 mg/dL 8.7(L) 8.4(L) 8.6(L)  Total Protein 6.5 - 8.1 g/dL - - -  Total Bilirubin 0.3 - 1.2 mg/dL - - -  Alkaline Phos 38 - 126 U/L - - -  AST 15 - 41 U/L - - -  ALT 0 - 44 U/L - - -    CBC Latest Ref Rng & Units 07/08/2020 07/07/2020 07/04/2020  WBC 4.0 - 10.5 K/uL 9.2 9.0 9.6  Hemoglobin 13.0 - 17.0 g/dL 8.6(L) 8.5(L) 8.0(L)  Hematocrit 39.0 - 52.0 % 29.0(L) 30.1(L) 27.9(L)  Platelets 150 - 400 K/uL 301 283 229   ABG    Component Value Date/Time   PHART 7.401 06/23/2020 1204   PCO2ART 57.4 (H) 06/23/2020 1204   PO2ART 73 (L) 06/23/2020 1204   HCO3 35.5 (H) 06/23/2020 1204   TCO2 37 (H) 06/23/2020 1204   ACIDBASEDEF 2.0 06/13/2020 1221   O2SAT 94.0 06/23/2020 1204    CBG (last 3)  Recent Labs    07/07/20 2359 07/08/20 0445 07/08/20 0816  GLUCAP 170* 192* 193*   Signature:    Lanier Clam, MD

## 2020-07-08 NOTE — Progress Notes (Addendum)
ANTICOAGULATION CONSULT NOTE  Pharmacy Consult for Warfarin  Indication: atrial fibrillation  Patient Measurements: Height: 5\' 10"  (177.8 cm) Weight: (!) 239.7 kg (528 lb 7.1 oz) IBW/kg (Calculated) : 73 Heparin Dosing Weight: 149.7 kg  Vital Signs: Temp: 98.2 F (36.8 C) (02/07 0446) Temp Source: Axillary (02/07 0446) BP: 132/68 (02/07 1000) Pulse Rate: 107 (02/07 0842)  Labs: Recent Labs    07/06/20 0139 07/07/20 0410 07/07/20 1027 07/08/20 0105  HGB  --   --  8.5* 8.6*  HCT  --   --  30.1* 29.0*  PLT  --   --  283 301  LABPROT 40.5* 38.5*  --  34.0*  INR 4.4* 4.1*  --  3.5*  CREATININE 1.10  --  1.04 0.93    Estimated Creatinine Clearance: 191.9 mL/min (by C-G formula based on SCr of 0.93 mg/dL).   Assessment: 49YOM admitted with septic shock secondary to necrotizing fascitis, on IV heparin for Afib. Was on xarelto PTA, but that is not appropriate based on patient's weight. Previously bridging warfarin with lovenox until INR therapeutic. LMWH level was elevated at 1.25 on 1/31 - dose was decreased to 225mg  q12hr. Patient with some sacral wound bleeding on 2/1 PM and received topical TXA/thrombi pads.   INR trending down today still supratherapeutic INR (2.9> 3.8>4.4>4.1>3.5). Will continue to hold warfarin today and consider dose reduction as appropriate.  Goal of Therapy:  INR 2 - 3 Monitor platelets by anticoagulation protocol: Yes   Plan:  Hold warfarin today Daily INR CBC  2/31, PharmD PGY2 ID Pharmacy Resident Phone between 7 am - 3:30 pm:  Please check AMION for all [facility] Pharmacy phone numbers After 10:00 PM, call Main Pharmacy [phone]  07/08/2020, 10:44 AM

## 2020-07-08 NOTE — Progress Notes (Signed)
SLP Cancellation Note  Patient Details Name: Carl Hill MRN: [id number] DOB: [birth year]   Cancelled treatment:       Reason Eval/Treat Not Completed: Other (comment). Pt working with PT. Planning on FEES tomorrow at 1030.   Harlon Ditty, MA CCC-SLP  Acute Rehabilitation Services Pager [phone] Office [phone]  Claudine Mouton 07/08/2020, 1:31 PM

## 2020-07-08 NOTE — Progress Notes (Signed)
27 Days Post-Op    CC: Necrotizing fasciitis  Subjective: Off Vent x 48 hrs.  Picture soft tissue debridement site is below.  Objective: Vital signs in last 24 hours: Temp:  [96.7 F (35.9 C)-98.2 F (36.8 C)] 98.2 F (36.8 C) (02/07 0446) Pulse Rate:  [95-108] 108 (02/07 0400) Resp:  [10-27] 20 (02/07 0700) BP: (115-136)/(55-103) 125/98 (02/07 0700) SpO2:  [84 %-100 %] 97 % (02/07 0700) FiO2 (%):  [35 %-40 %] 35 % (02/07 0418) Weight:  [239.7 kg] 239.7 kg (02/07 0500) Last BM Date: 07/07/20 (flexiseal) 2350 Feeding tube 2690 urine Stool 375(rectal tube) Afebrile, Trach collar now +>> 2/6   Intake/Output from previous day: 02/06 0701 - 02/07 0700 In: 2350 [NG/GT:2350] Out: 3065 [Urine:2690; Stool:375] Intake/Output this shift: No intake/output data recorded.  General appearance: alert, cooperative and Off vent on trach collar.  He tolerated the dressing change fairly well. Skin: See picture below.   Open wound off hydrothearpy for 4 days, site still looks good.  No current need for hydro or further debridement.   Lab Results:  Recent Labs    07/07/20 1027 07/08/20 0105  WBC 9.0 9.2  HGB 8.5* 8.6*  HCT 30.1* 29.0*  PLT 283 301    BMET Recent Labs    07/07/20 1027 07/08/20 0105  NA 142 144  K 4.2 4.2  CL 104 106  CO2 27 26  GLUCOSE 234* 219*  BUN 36* 37*  CREATININE 1.04 0.93  CALCIUM 8.4* 8.7*   PT/INR Recent Labs    07/07/20 0410 07/08/20 0105  LABPROT 38.5* 34.0*  INR 4.1* 3.5*    No results for input(s): AST, ALT, ALKPHOS, BILITOT, PROT, ALBUMIN in the last 168 hours.   Lipase  No results found for: LIPASE   Medications: . amiodarone  200 mg Per Tube Daily  . amoxicillin-clavulanate  1 tablet Per Tube Q8H  . atorvastatin  20 mg Per Tube QPM  . chlorhexidine  15 mL Mouth Rinse BID  . Chlorhexidine Gluconate Cloth  6 each Topical Daily  . collagenase  1 application Topical Daily  . docusate  100 mg Per Tube BID  . feeding  supplement (PROSource TF)  90 mL Per Tube QID  . free water  200 mL Per Tube Q8H  . insulin aspart  0-20 Units Subcutaneous Q4H  . insulin aspart  8 Units Subcutaneous Q4H  . insulin detemir  40 Units Subcutaneous BID  . levothyroxine  75 mcg Per Tube Daily  . mouth rinse  15 mL Mouth Rinse q12n4p  . nortriptyline  25 mg Per Tube BID  . oxyCODONE  10 mg Per Tube Q6H  . pantoprazole sodium  40 mg Per Tube Q24H  . polyethylene glycol  17 g Per Tube BID  . pregabalin  300 mg Per Tube BID  . sennosides  10 mL Per Tube BID  . sodium chloride flush  10-40 mL Intracatheter Q12H  . sodium chloride flush  3 mL Intravenous Q12H  . Warfarin - Pharmacist Dosing Inpatient   Does not apply q1600   . sodium chloride Stopped (06/28/20 2035)  . feeding supplement (PIVOT 1.5 CAL) 1,000 mL (07/07/20 0133)    Assessment/Plan A. Fib VT Arrest (1/10) DM2 Hypothyroidism Hx HTN S/p trach collar => 2/6 Morbid obesity BMI 75.8  50 yo male with septic shock secondary to perianal/perineal necrotizing soft tissue infection-s/p operative debridement x2(06/10/20 and 06/11/20) - Continuedaily saline wet-to-dry dressingsand hydro - No indication for further debridement -Antibiotics per  ID - currently onaugmentin - Appreciate CCM's assistance in management of patient -As mentioned previously, the patient is not a candidate for colostomy 2/2 body habitus.    FEN: Tube feeding ID: Augmentin 1/27 >> day 11 DVT Lovenox/INR 2.9  Plan:  Can defer to wound care.  No further need for surgical debridement or hydrotherapy.  I would recommend continuing twice daily dressing changes, or as needed for fecal contamination.      LOS: 28 days    Kelcie Currie 07/08/2020 Please see Amion

## 2020-07-08 NOTE — Progress Notes (Signed)
eLink Physician-Brief Progress Note Patient Name: ZEBBIE ACE DOB: Nov 12, 1970 MRN: [id number]   Date of Service  07/08/2020  HPI/Events of Note  Similar to yesterday AM, RN requesting a one time order for Fentanyl prn for pain after changing wound dressing  eICU Interventions  Order placed     Intervention Category Intermediate Interventions: Pain - evaluation and management  Oretha Milch 07/08/2020, 5:47 AM

## 2020-07-08 NOTE — Progress Notes (Signed)
Occupational Therapy Treatment Patient Details Name: Carl Hill MRN: [id number] DOB: [birth year] Today's Date: 07/08/2020    History of present illness Pt is a 50 year old male who presented with buttock pain, fatigue and malaise.  Found to have hyperglycemia. CT pelvis demonstrated necrotizing fasciitis in L buttock, L upper thigh, and perineum. Taken to OR for debridement 1/10 Hill/b Afib with RVR and VT arrest s/p CPR/defib. Trached 1/26, remains on full vent support. PMHx: HTN, obesity, DM, AFib.  As of 07/04/20 started TC trials.   OT comments  Pt tilted up to 50 degrees today with VSS throughout on 80% TC 10 L. HR 90s, RR in the 10s-20s.  Pt eating ice chips and washing face slowly at 30-39* for x10 mins overall. Pt attempting BUE HEP for AROm and pt became anxious and required cues to slow beathing down. Plan to split PT/OT sessions from now forward as he does not need all three of [place] to tilt now that pt/staff are getting better at setting up the bed (cross straps at knees) and he is tolerating the tilts well without signs of distress or adverse cardiopulmonary reactions.  Pt would greatly benefit from continued OT skilled services. OT following acutely.   Follow Up Recommendations  SNF;Supervision/Assistance - 24 hour    Equipment Recommendations  Wheelchair (measurements OT);Wheelchair cushion (measurements OT);Hospital bed;3 in 1 bedside commode    Recommendations for Other Services      Precautions / Restrictions Precautions Precautions: Fall;Other (comment) Precaution Comments: TC, rectal tube, large buttocks wound, hydro on hold. Restrictions Weight Bearing Restrictions: No       Mobility Bed Mobility Overal bed mobility: Needs Assistance             General bed mobility comments: Pt is able to help reposition legs, unable to lift against gravity fully yet, assists with +3 for scoot to HOB, pulling with arms, encouraged flexed knees and pushing with legs  today.  Transfers                      Balance                                           ADL either performed or assessed with clinical judgement   ADL Overall ADL's : Needs assistance/impaired Eating/Feeding: Set up;Bed level Eating/Feeding Details (indicate cue type and reason): standing at 30* for ice chips x10 mins                                 Functional mobility during ADLs: Maximal assistance;+2 for physical assistance;+2 for safety/equipment General ADL Comments: Pt eating ice chips safely after set-upA, wiping face. pt stating "I prefer when my mom does it for me." OT encouraging pt to do for himself as his mom cannot be here all of the time     Vision   Vision Assessment?: No apparent visual deficits   Perception     Praxis      Cognition Arousal/Alertness: Awake/alert Behavior During Therapy: Anxious Overall Cognitive Status: Within Functional Limits for tasks assessed  Exercises General Exercises - Upper Extremity Shoulder Flexion: Both;10 reps;Seated;Standing Elbow Flexion: AROM;Both;5 reps;Standing Elbow Extension: AROM;Both;5 reps;Standing   Shoulder Instructions       General Comments Pt tilted up to 50 degrees today with VSS throughout on 80% TC 10 L.  At times pt gets anxious and breathing becomes SOB, but able to talk him down with calming words, encouragement to breathe slowly and deeply.  All believeable O2 sats were in the 90s, RR in the 10s-20s.  Plan to split PT/OT sessions from now forward as he does not need all three of [place] to tilt now that pt/staff are getting better at setting up the bed (cross straps at knees) and he is tolerating the tilts well without signs of distress or adverse cardiopulmonary reactions.    Pertinent Vitals/ Pain       Pain Assessment: 0-10 Pain Score: 9  Pain Location: up to 9 for bottom, 7 back Pain Descriptors /  Indicators: Grimacing;Guarding;Crying Pain Intervention(s): Monitored during session;Premedicated before session;Repositioned  Home Living                                          Prior Functioning/Environment              Frequency  Min 2X/week        Progress Toward Goals  OT Goals(current goals can now be found in the care plan section)  Progress towards OT goals: Progressing toward goals  Acute Rehab OT Goals Patient Stated Goal: to get better OT Goal Formulation: With patient/family Time For Goal Achievement: 07/16/20 Potential to Achieve Goals: Good ADL Goals Pt Will Perform Grooming: with set-up;sitting Pt Will Perform Upper Body Dressing: with set-up;sitting Pt/caregiver will Perform Home Exercise Program: Increased strength;Both right and left upper extremity;With Supervision;With theraband Additional ADL Goal #1: Pt will increase to minA +2 for bed mobility to increase independence for OOB ADL. Additional ADL Goal #2: Pt will tolerate x5 mins of unsupported sitting for dynamic sitting balance tasks with O2 >90%.  Plan Discharge plan remains appropriate    Co-evaluation    PT/OT/SLP Co-Evaluation/Treatment: Yes Reason for Co-Treatment: Complexity of the patient's impairments (multi-system involvement)   OT goals addressed during session: Strengthening/ROM      AM-PAC OT "6 Clicks" Daily Activity     Outcome Measure   Help from another person eating meals?: Total Help from another person taking care of personal grooming?: A Little Help from another person toileting, which includes using toliet, bedpan, or urinal?: Total Help from another person bathing (including washing, rinsing, drying)?: A Lot Help from another person to put on and taking off regular upper body clothing?: A Lot Help from another person to put on and taking off regular lower body clothing?: Total 6 Click Score: 10    End of Session Equipment Utilized During  Treatment: Oxygen  OT Visit Diagnosis: Unsteadiness on feet (R26.81);Muscle weakness (generalized) (M62.81);Pain Pain - part of body:  (back and butt)   Activity Tolerance Patient tolerated treatment well   Patient Left in bed;with call bell/phone within reach;with nursing/sitter in room   Nurse Communication Mobility status        Time: 6160-7371 OT Time Calculation (min): 71 min  Charges: OT General Charges $OT Visit: 1 Visit OT Treatments $Therapeutic Activity: 23-37 mins  Flora Lipps, OTR/L Acute Rehabilitation Services Pager: [phone] Office: [phone]    Carl Hill 07/08/2020,  6:38 PM

## 2020-07-08 NOTE — Progress Notes (Signed)
Physical Therapy Treatment Patient Details Name: Carl Hill MRN: [id number] DOB: [birth year] Today's Date: 07/08/2020    History of Present Illness Pt is a 50 year old male who presented with buttock pain, fatigue and malaise.  Found to have hyperglycemia. CT pelvis demonstrated necrotizing fasciitis in L buttock, L upper thigh, and perineum. Taken to OR for debridement 1/10 c/b Afib with RVR and VT arrest s/p CPR/defib. Trached 1/26, remains on full vent support. PMHx: HTN, obesity, DM, AFib.  As of 07/04/20 started TC trials.    PT Comments    Pt was able to tolerate a higher tilt on the Kreg tilt bed today.  He is looking more alert, better color in his face, and BP tolerance to being upright today was improved (VSS on 80% TC 10L).  Serial vitals taken throughout.  Pt was tipped in some form or another for >30 mins, limited mostly by back and buttocks pain.   He is making measurable improvements with every session and actually reported his bottom felt better when tilted at 50 degrees (off loading his buttocks in standing) although his back hurt more upright.   PT/OT plan to split sessions now to give him more opportunity to move/tilt, see therapy throughout the week.  Kreg bed rep continues to come and meet [place] for our sessions to assist with comfort, proficiency with the bed and pad placement in a bariatric patient.   Follow Up Recommendations  SNF     Equipment Recommendations  Hospital bed;Other (comment) (hoyer lift, air mattress, all bariatric equipment.)    Recommendations for Other Services       Precautions / Restrictions Precautions Precautions: Fall;Other (comment) Precaution Comments: TC, rectal tube, large buttocks wound, hydro on hold.    Mobility  Bed Mobility Overal bed mobility: Needs Assistance             General bed mobility comments: Pt is able to help reposition legs, unable to lift against gravity fully yet, assists with +3 for scoot to [facility], pulling with  arms, encouraged flexed knees and pushing with legs today.  Transfers                    Ambulation/Gait                 Stairs             Wheelchair Mobility    Modified Rankin (Stroke Patients Only)       Balance                                            Cognition Arousal/Alertness: Awake/alert Behavior During Therapy: Anxious Overall Cognitive Status: Within Functional Limits for tasks assessed                                        Exercises      General Comments General comments (skin integrity, edema, etc.): Pt tilted up to 50 degrees today with VSS throughout (serial BPs) on 80% TC 10 L.  At times pt gets anxious and breathing becomes irratic, but able to talk him down with calming words, encouragement to breathe slowly and deeply.  All believeable O2 sats were in the 90s, RR in the 10s-20s.  Plan to split PT/OT sessions  from now forward as he does not need all three of [place] to tilt now that pt/staff are getting better at setting up the bed (cross straps at knees) and he is tolerating the tilts well without signs of distress or adverse cardiopulmonary reactions.      Pertinent Vitals/Pain Pain Assessment: 0-10 Pain Score: 9  Pain Location: up to 9 for bottom, 7 back Pain Descriptors / Indicators: Grimacing;Guarding;Crying Pain Intervention(s): Limited activity within patient's tolerance;Monitored during session;Repositioned;Other (comment) (RN reports it is not time for pain meds.)    Home Living                      Prior Function            PT Goals (current goals can now be found in the care plan section) Acute Rehab PT Goals Patient Stated Goal: to get better Progress towards PT goals: Progressing toward goals    Frequency    Min 3X/week (for tilting purposes and to cover the spread with OT)      PT Plan Current plan remains appropriate    Co-evaluation PT/OT/SLP  Co-Evaluation/Treatment: Yes Reason for Co-Treatment: Complexity of the patient's impairments (multi-system involvement);For patient/therapist safety;To address functional/ADL transfers          AM-PAC PT "6 Clicks" Mobility   Outcome Measure  Help needed turning from your back to your side while in a flat bed without using bedrails?: Total Help needed moving from lying on your back to sitting on the side of a flat bed without using bedrails?: Total Help needed moving to and from a bed to a chair (including a wheelchair)?: Total Help needed standing up from a chair using your arms (e.g., wheelchair or bedside chair)?: Total Help needed to walk in hospital room?: Total Help needed climbing 3-5 steps with a railing? : Total 6 Click Score: 6    End of Session Equipment Utilized During Treatment: Oxygen Activity Tolerance: Patient limited by pain   Nurse Communication: Mobility status       Time: 2694-8546 PT Time Calculation (min) (ACUTE ONLY): 59 min  Charges:  $Therapeutic Activity: 23-37 mins                     Corinna Capra, PT, DPT  Acute Rehabilitation [phone] pager [phone]) [phone] office

## 2020-07-09 DIAGNOSIS — N493 Fournier gangrene: Secondary | ICD-10-CM | POA: Diagnosis not present

## 2020-07-09 LAB — PROTIME-INR
INR: 2.8 — ABNORMAL HIGH (ref 0.8–1.2)
Prothrombin Time: 28.8 seconds — ABNORMAL HIGH (ref 11.4–15.2)

## 2020-07-09 LAB — BASIC METABOLIC PANEL
Anion gap: 11 (ref 5–15)
BUN: 38 mg/dL — ABNORMAL HIGH (ref 6–20)
CO2: 26 mmol/L (ref 22–32)
Calcium: 8.7 mg/dL — ABNORMAL LOW (ref 8.9–10.3)
Chloride: 105 mmol/L (ref 98–111)
Creatinine, Ser: 0.97 mg/dL (ref 0.61–1.24)
GFR, Estimated: >60 mL/min (ref >60)
Glucose, Bld: 186 mg/dL — ABNORMAL HIGH (ref 70–99)
Potassium: 4.3 mmol/L (ref 3.5–5.1)
Sodium: 142 mmol/L (ref 135–145)

## 2020-07-09 LAB — CBC
HCT: 28.8 % — ABNORMAL LOW (ref 39.0–52.0)
Hemoglobin: 8.6 g/dL — ABNORMAL LOW (ref 13.0–17.0)
MCH: 28.1 pg (ref 26.0–34.0)
MCHC: 29.9 g/dL — ABNORMAL LOW (ref 30.0–36.0)
MCV: 94.1 fL (ref 80.0–100.0)
Platelets: 284 10*3/uL (ref 150–400)
RBC: 3.06 MIL/uL — ABNORMAL LOW (ref 4.22–5.81)
RDW: 16.8 % — ABNORMAL HIGH (ref 11.5–15.5)
WBC: 10.4 10*3/uL (ref 4.0–10.5)
nRBC: 0.2 % (ref 0.0–0.2)

## 2020-07-09 LAB — GLUCOSE, CAPILLARY
Glucose-Capillary: 133 mg/dL — ABNORMAL HIGH (ref 70–99)
Glucose-Capillary: 168 mg/dL — ABNORMAL HIGH (ref 70–99)
Glucose-Capillary: 168 mg/dL — ABNORMAL HIGH (ref 70–99)
Glucose-Capillary: 176 mg/dL — ABNORMAL HIGH (ref 70–99)
Glucose-Capillary: 192 mg/dL — ABNORMAL HIGH (ref 70–99)
Glucose-Capillary: 208 mg/dL — ABNORMAL HIGH (ref 70–99)

## 2020-07-09 MED ORDER — FENTANYL CITRATE (PF) 100 MCG/2ML IJ SOLN
50.0000 ug | Freq: Once | INTRAMUSCULAR | Status: AC
  Administered 2020-07-09: 50 ug via INTRAVENOUS
  Filled 2020-07-09: qty 2

## 2020-07-09 MED ORDER — INSULIN ASPART 100 UNIT/ML ~~LOC~~ SOLN
12.0000 [IU] | SUBCUTANEOUS | Status: DC
  Administered 2020-07-09 – 2020-07-10 (×8): 12 [IU] via SUBCUTANEOUS

## 2020-07-09 MED ORDER — OXYCODONE HCL 5 MG/5ML PO SOLN
15.0000 mg | Freq: Four times a day (QID) | ORAL | Status: DC
  Administered 2020-07-09 – 2020-07-17 (×33): 15 mg via ORAL
  Filled 2020-07-09 (×33): qty 15

## 2020-07-09 MED ORDER — WARFARIN SODIUM 10 MG PO TABS
10.0000 mg | ORAL_TABLET | Freq: Once | ORAL | Status: AC
  Administered 2020-07-09: 10 mg via ORAL
  Filled 2020-07-09 (×2): qty 1

## 2020-07-09 MED ORDER — HYDROMORPHONE HCL 1 MG/ML IJ SOLN
1.5000 mg | Freq: Three times a day (TID) | INTRAMUSCULAR | Status: DC | PRN
  Administered 2020-07-10 – 2020-08-04 (×57): 1.5 mg via INTRAVENOUS
  Filled 2020-07-09 (×2): qty 1.5
  Filled 2020-07-09 (×2): qty 2
  Filled 2020-07-09 (×3): qty 1.5
  Filled 2020-07-09 (×3): qty 2
  Filled 2020-07-09 (×3): qty 1.5
  Filled 2020-07-09: qty 2
  Filled 2020-07-09: qty 1.5
  Filled 2020-07-09: qty 2
  Filled 2020-07-09 (×3): qty 1.5
  Filled 2020-07-09: qty 2
  Filled 2020-07-09 (×6): qty 1.5
  Filled 2020-07-09: qty 2
  Filled 2020-07-09: qty 1.5
  Filled 2020-07-09: qty 2
  Filled 2020-07-09: qty 1.5
  Filled 2020-07-09 (×3): qty 2
  Filled 2020-07-09 (×6): qty 1.5
  Filled 2020-07-09: qty 2
  Filled 2020-07-09 (×2): qty 1.5
  Filled 2020-07-09 (×2): qty 2
  Filled 2020-07-09 (×2): qty 1.5
  Filled 2020-07-09: qty 2
  Filled 2020-07-09 (×3): qty 1.5
  Filled 2020-07-09: qty 2
  Filled 2020-07-09: qty 1.5
  Filled 2020-07-09: qty 2
  Filled 2020-07-09 (×2): qty 1.5
  Filled 2020-07-09: qty 2
  Filled 2020-07-09: qty 1.5
  Filled 2020-07-09 (×4): qty 2
  Filled 2020-07-09 (×2): qty 1.5

## 2020-07-09 MED ORDER — INSULIN DETEMIR 100 UNIT/ML ~~LOC~~ SOLN
42.0000 [IU] | Freq: Two times a day (BID) | SUBCUTANEOUS | Status: DC
  Administered 2020-07-09 – 2020-08-05 (×55): 42 [IU] via SUBCUTANEOUS
  Filled 2020-07-09 (×60): qty 0.42

## 2020-07-09 NOTE — Progress Notes (Signed)
NAME:  Carl Hill, MRN:  [id number], DOB:  13-Feb-1971, LOS: 36 ADMISSION DATE:  06/10/2020, CONSULTATION DATE:  06/10/2020 REFERRING MD:  Lorin Mercy, CHIEF COMPLAINT:  Severe Sepsis    Brief History:  50 year old male who presented with buttock pain, fatigue and malaise.  Found to have hyperglycemia. CT pelvis demonstrated necrotizing fasciitis in L buttock, L upper thigh, and perineum. Taken to OR for debridement 1/10 c/b Afib with RVR and VT arrest s/p CPR/defib. Trached 1/26, remains on full vent support.  Past Medical History:  HTN, obesity, DM, AFib on DOAC  Significant Hospital Events:  1/10  Admitted to [facility]. General surgery consulted and debridement pursued. In the afternoon, patient developed hypotension with AF in RVR. Synchronized cardioversion unsuccessful with conversion to VT. CPR initated with ROSC after desynchronized defibrillation. 1/11 Debridement 1/18 Mucus plugging 1/19 Bronchoscopy >> mucus plug on Rt, airway collapse with exhalation 1/20 Persistent fever, change ABx 1/24 Increased vent requirements, habitus related +/- mucus plugging 1/25 Hypotensive to SBP 80s, Levo resumed, minimally responsive overnight off sedation, CT Head negative 1/26 Stable vent/pressor requirements, bronched, tracheostomy 1/29 start hydrotherapy 1/30 start lovenox/coumadin 2/2 Tolerating PS 2/4 did TC for 12 hours 2/5 Did 24 hours TC 2/6 Continue TC  2/7 Try dilaudid with turns/dressing change for better pain control - longer acting, fentanyl IV d/c'd 2/8 Increase standing oxy, dilaudid PRN for turns/dressing changes, downgrade to progressive  Consults:  General Surgery ID s/o 1/25  Procedures:  ETT 1/10 >>  R IJ CVL 1/10 >>  L brachial A-line 1/10 >>   Significant Diagnostic Tests:   CT abd/pelvis 1/10 >> Extensive subcutaneous emphysema in the medial aspect of the upper thighs extending through perineum and superiorly to the medial left buttock. Gas extends into the pelvis  surrounding the anus inferiorly and tracking superiorly along the left wall of the rectum.  CT C/A/P 1/22 >> Imaging limited due to body habitus. No gross fluid collection or gas seen. Bilateral consolidation with dependent atelectasis  CT Head 1/25 >> No acute intracranial abnormality  Micro Data:  COVID/Flu 1/10 >> negative MRSA PCR 1/10 >> negative L buttocks wound 1/10 >> E. Coli (pansensitive), Streptococcus infantarious (sensitive to PCN, ceftriaxone, vanc), Peptostreptococcus species, rare GBS Blood 1/10 >> negative BAL 1/20 >> normal flora Resp Cx 1/26 > Normal flora  Antimicrobials:  Aztreonam 1/09 Metronidazole 1/09 Vancomycin 1/09, 1/11, 1/20 >>1/22 Cefepime 1/10 >> 1/13, 1/20 >>1/22 Clindamycin 1/10 >> 1/13 Ceftriaxone 1/13 >> 1/20 Metronidazole 1/17 >> 1/22 Meropenem 1/22 >> 1/25 Amoxicillin 1/24 (x 1 for PCN Allergy challenge) Unasyn 1/25 >> 1/27 Augmentin 1/27 >>  Interim History / Subjective:  TC last 72 hours. Thirsty. Still pain an issue.   Objective   Blood pressure 114/82, pulse (!) 108, temperature 98.2 F (36.8 C), temperature source Axillary, resp. rate 15, height _0  (1.778 m), weight (!) 239.7 kg, SpO2 (!) 86 %.    FiO2 (%):  [35 %-40 %] 40 %   Intake/Output Summary (Last 24 hours) at 07/09/2020 1346 Last data filed at 07/09/2020 0400 Gross per 24 hour  Intake 450 ml  Output 1900 ml  Net -1450 ml   Filed Weights   07/06/20 0200 07/07/20 0407 07/08/20 0500  Weight: (!) 268.9 kg (!) 239.7 kg (!) 239.7 kg   Physical Exam: General: Morbidly obese, no acute distress HENT: , AT, OP clear, MMM, speaks with PMV Neck: Trach in place, c/d/i Eyes: EOMI, no scleral icterus Respiratory: Clear to auscultation bilaterally.  No crackles, wheezing or rales Cardiovascular: RRR, -M/R/G, no JVD GI: BS+, soft, nontender Neuro: AAO x4, CNII-XII grossly intact Psych: Normal mood, normal affect GU: Foley in place   Resolved problems:  VT arrest 1/10,  Ileus 1/15, Elevated LFTs from shock, Metabolic acidosis with lactic acidosis, Septic shock Acute metabolic encephalopathy 2nd to sepsis, hypoxia, hypercapnia. 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 Bronchomalacia. Probable sleep disordered breathing. 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 Bleeding from sacral site - no further issue - Hydrotherapy 1/29-2/3 - Surgery following. No further debridement indicated - Long course of antibiotics, currently on augmentin (6 month plan) - Thrombi pads and TMA soaked dressing for bleed  Hypernatremia from diuresis and insensible losses - improved - Increase free water 2/7 with thirst - Trend  Acute blood loss anemia, likely from frequent blood draws - S/p 1 U PRBC on 1/31 and 2/3. Appropriate Hg response to transfusion - Trend CBC   Paroxysmal Afib - currently NSR Hx of HLD. - continue amiodarone, lipitor - coumadin per pharmacy. 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 - Continue Lyrica/nortriptyline  Anemia of critical illness. - f/u CBC - transfuse for Hb < 7 or significant bleeding  Afib: -warfarin (on hold, elevated INR) - per pharmacy  Best practice (evaluated daily)  Diet: Continue TF DVT prophylaxis: lovenox/coumadin GI prophylaxis: Protonix  Mobility: Bedrest Disposition: ICU Family:  Code status: Full Code  Labs    CMP Latest Ref Rng & Units 07/09/2020 07/08/2020 07/07/2020  Glucose 70 - 99 mg/dL 186(H) 219(H) 234(H)   BUN 6 - 20 mg/dL 38(H) 37(H) 36(H)  Creatinine 0.61 - 1.24 mg/dL 0.97 0.93 1.04  Sodium 135 - 145 mmol/L 142 144 142  Potassium 3.5 - 5.1 mmol/L 4.3 4.2 4.2  Chloride 98 - 111 mmol/L 105 106 104  CO2 22 - 32 mmol/L _0 Calcium 8.9 - 10.3 mg/dL 8.7(L) 8.7(L) 8.4(L)  Total Protein 6.5 - 8.1 g/dL - - -  Total Bilirubin 0.3 - 1.2 mg/dL - - -  Alkaline Phos 38 - 126 U/L - - -  AST 15 - 41 U/L - - -  ALT 0 - 44 U/L - - -    CBC Latest Ref Rng & Units 07/09/2020 07/08/2020 07/07/2020  WBC 4.0 - 10.5 K/uL 10.4 9.2 9.0  Hemoglobin 13.0 - 17.0 g/dL 8.6(L) 8.6(L) 8.5(L)  Hematocrit 39.0 - 52.0 % 28.8(L) 29.0(L) 30.1(L)  Platelets 150 - 400 K/uL 284 301 283   ABG    Component Value Date/Time   PHART 7.401 06/23/2020 1204   PCO2ART 57.4 (H) 06/23/2020 1204   PO2ART 73 (L) 06/23/2020 1204   HCO3 35.5 (H) 06/23/2020 1204   TCO2 37 (H) 06/23/2020 1204   ACIDBASEDEF 2.0 06/13/2020 1221   O2SAT 94.0 06/23/2020 1204    CBG (last 3)  Recent Labs    07/09/20 0431 07/09/20 0759 07/09/20 1136  GLUCAP 168* 192* 168*   Signature:    Lanier Clam, MD

## 2020-07-09 NOTE — Progress Notes (Signed)
eLink Physician-Brief Progress Note Patient Name: Carl Hill DOB: 08/06/1970 MRN: [id number]   Date of Service  07/09/2020  HPI/Events of Note  Patient hollering out in pain.   eICU Interventions  Plan: 1. Fentanyl 50 mcg IV X 1 now.      Intervention Category Major Interventions: Delirium, psychosis, severe agitation - evaluation and management;Other:  Manuela Halbur Dennard Nip 07/09/2020, 4:12 AM

## 2020-07-09 NOTE — Procedures (Addendum)
Objective Swallowing Evaluation: Type of Study: FEES-Fiberoptic Endoscopic Evaluation of Swallow   Patient Details  Name: Carl Hill MRN: [id number] Date of Birth: [birth year]  Today's Date: 07/09/2020 Time: SLP Start Time (ACUTE ONLY): 1030 -SLP Stop Time (ACUTE ONLY): 1130  SLP Time Calculation (min) (ACUTE ONLY): 60 min   Past Medical History:  Past Medical History:  Diagnosis Date  . 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 Asthma   . Atrial fibrillation (HCC)   . Diabetes mellitus without complication (HCC)   . History of cardioversion    3 times   . Hypertension   . Sepsis due to Streptococcus, group B (HCC) 06/22/2020   Past Surgical History:  Past Surgical History:  Procedure Laterality Date  . ABDOMINAL SURGERY    . INCISION AND DRAINAGE PERIRECTAL ABSCESS N/A 06/11/2020   Procedure: IRRIGATION AND DEBRIDEMENT PERINEAL WOUND;  Surgeon: Fritzi Mandes, MD;  Location: [facility] OR;  Service: General;  Laterality: N/A;  . IRRIGATION AND DEBRIDEMENT ABSCESS N/A 06/10/2020   Procedure: IRRIGATION AND DEBRIDEMENT ABSCESS;  Surgeon: Fritzi Mandes, MD;  Location: [facility] OR;  Service: General;  Laterality: N/A;  . LAPAROSCOPIC GASTRIC SLEEVE RESECTION     HPI: Per pulmonary note, "50 year old male who presented with buttock pain, fatigue and malaise.  Found to have hyperglycemia. CT pelvis demonstrated necrotizing fasciitis in L buttock, L upper thigh, and perineum. Taken to OR for debridement 1/10 c/b Afib with RVR and VT arrest s/p CPR/defib. Trached 1/26".    Subjective: pt awake in bed, alert    Assessment / Plan / Recommendation  CHL IP CLINICAL IMPRESSIONS 07/09/2020  Clinical Impression Pts primary problem interfering with safe swallowing is pain on his sacral wound. Pt is unable to tolerate an upright position and is even in severe pain laying flat on his back. He was given pain medication prior to this assessment to aid in tolerance; despite this he was able to stay fully alert. Given  his inability to sit up, pt was tested laying with head only minimally upright in a reverse trendelenburg position. Pt was noted to have bilateral excresence on posterior vocal folds and a very strong cough to expectorate secretions (small white tube also still in place in left nare, tube was sitting in trachea, SLP requested RN remove it and test was resumed). Pt was able to tolerate sips of thin liquids with timely laryngeal closure and no aspiration with liquids in isolation. Puree also tolerated well. Pt able to masticate soft solids despite poor denitition, but bolus lodged in the vallecule due to positioning and pt could not clear it with small sips. Multiple large sips of water given with positive sensed aspiration. Recommend pt initiate a full thin liquid diet for now. Would benefit from a sidying position with intake. When pain is better managed and pt can tolerate his baseline positiong for PO intake will consider solid upgrade.  SLP Visit Diagnosis Dysphagia, unspecified (R13.10)  Attention and concentration deficit following --  Frontal lobe and executive function deficit following --  Impact on safety and function Mild aspiration risk      CHL IP TREATMENT RECOMMENDATION 07/09/2020  Treatment Recommendations Therapy as outlined in treatment plan below     Prognosis 07/09/2020  Prognosis for Safe Diet Advancement Good  Barriers to Reach Goals --  Barriers/Prognosis Comment --    CHL IP DIET RECOMMENDATION 07/09/2020  SLP Diet Recommendations Thin liquid  Liquid Administration via Straw  Medication Administration Whole meds with liquid  Compensations  Slow rate;Other (Comment)  Postural Changes Remain semi-upright after after feeds/meals (Comment)      CHL IP OTHER RECOMMENDATIONS 07/09/2020  Recommended Consults --  Oral Care Recommendations Oral care BID  Other Recommendations Have oral suction available      CHL IP FOLLOW UP RECOMMENDATIONS 07/09/2020  Follow up Recommendations  Inpatient Rehab      CHL IP FREQUENCY AND DURATION 07/09/2020  Speech Therapy Frequency (ACUTE ONLY) min 2x/week  Treatment Duration 2 weeks           CHL IP ORAL PHASE 07/09/2020  Oral Phase WFL  Oral - Pudding Teaspoon --  Oral - Pudding Cup --  Oral - Honey Teaspoon --  Oral - Honey Cup --  Oral - Nectar Teaspoon --  Oral - Nectar Cup --  Oral - Nectar Straw --  Oral - Thin Teaspoon --  Oral - Thin Cup --  Oral - Thin Straw --  Oral - Puree --  Oral - Mech Soft --  Oral - Regular --  Oral - Multi-Consistency --  Oral - Pill --  Oral Phase - Comment --    CHL IP PHARYNGEAL PHASE 07/09/2020  Pharyngeal Phase Impaired  Pharyngeal- Pudding Teaspoon --  Pharyngeal --  Pharyngeal- Pudding Cup --  Pharyngeal --  Pharyngeal- Honey Teaspoon --  Pharyngeal --  Pharyngeal- Honey Cup --  Pharyngeal --  Pharyngeal- Nectar Teaspoon --  Pharyngeal --  Pharyngeal- Nectar Cup --  Pharyngeal --  Pharyngeal- Nectar Straw --  Pharyngeal --  Pharyngeal- Thin Teaspoon --  Pharyngeal --  Pharyngeal- Thin Cup --  Pharyngeal --  Pharyngeal- Thin Straw WFL  Pharyngeal --  Pharyngeal- Puree WFL  Pharyngeal --  Pharyngeal- Mechanical Soft --  Pharyngeal --  Pharyngeal- Regular Pharyngeal residue - valleculae  Pharyngeal --  Pharyngeal- Multi-consistency --  Pharyngeal --  Pharyngeal- Pill --  Pharyngeal --  Pharyngeal Comment --     No flowsheet data found.  Harlon Ditty, MA CCC-SLP  Acute Rehabilitation Services Pager [phone] Office [phone]  Claudine Mouton 07/09/2020, 11:50 AM

## 2020-07-09 NOTE — Progress Notes (Signed)
ANTICOAGULATION CONSULT NOTE  Pharmacy Consult for Warfarin  Indication: atrial fibrillation  Patient Measurements: Height: 5\' 10"  (177.8 cm) Weight: (!) 239.7 kg (528 lb 7.1 oz) IBW/kg (Calculated) : 73 Heparin Dosing Weight: 149.7 kg  Vital Signs: Temp: 97.5 F (36.4 C) (02/08 0802) Temp Source: Oral (02/08 0802) BP: 147/129 (02/08 0600) Pulse Rate: 113 (02/08 0825)  Labs: Recent Labs    07/07/20 0410 07/07/20 1027 07/07/20 1027 07/08/20 0105 07/09/20 0125  HGB  --  8.5*   < > 8.6* 8.6*  HCT  --  30.1*  --  29.0* 28.8*  PLT  --  283  --  301 284  LABPROT 38.5*  --   --  34.0* 28.8*  INR 4.1*  --   --  3.5* 2.8*  CREATININE  --  1.04  --  0.93 0.97   < > = values in this interval not displayed.    Estimated Creatinine Clearance: 184 mL/min (by C-G formula based on SCr of 0.97 mg/dL).   Assessment: 49YOM admitted with septic shock secondary to necrotizing fascitis, on IV heparin for Afib. Was on xarelto PTA, but that is not appropriate based on patient's weight. Previously bridging warfarin with lovenox until INR therapeutic. LMWH level was elevated at 1.25 on 1/31 - dose was decreased to 225mg  q12hr. Patient with some sacral wound bleeding on 2/1 PM and received topical TXA/thrombi pads.   INR continues to trend down today; now therapeutic at 2.8. Will resume warfarin today at reduced dose (10mg  = ~30% decrease from average daily dose of ~15mg  while admitted).   Goal of Therapy:  INR 2 - 3 Monitor platelets by anticoagulation protocol: Yes   Plan:  Warfarin 10mg  x1 Daily INR CBC  2/31, PharmD PGY2 ID Pharmacy Resident Phone between 7 am - 3:30 pm:  Please check AMION for all [facility] phone numbers After 10:00 PM, call Main Pharmacy [phone]  07/09/2020, 9:24 AM

## 2020-07-09 NOTE — Progress Notes (Signed)
Physical Therapy Treatment Patient Details Name: Carl Hill MRN: [id number] DOB: 04-23-1971 Today's Date: 07/09/2020    History of Present Illness Pt is a 50 year old male who presented with buttock pain, fatigue and malaise.  Found to have hyperglycemia. CT pelvis demonstrated necrotizing fasciitis in L buttock, L upper thigh, and perineum. Taken to OR for debridement 1/10 c/b Afib with RVR and VT arrest s/p CPR/defib. Trached 1/26, remains on full vent support. PMHx: HTN, obesity, DM, AFib.  As of 07/04/20 started TC trials.    PT Comments    Limited bed level session today focused on repositioning for comfort and pressure relief in the bed.  He did not want to attempt standing on Kreg bed due to reports of just feeling he has gotten his pain under control. He is in left side lying.  He is agreeable for therapy and Kreg bed rep to continue to check in.  PT will continue to follow acutely for safe mobility progression.   Follow Up Recommendations  SNF     Equipment Recommendations  Hospital bed;Other (comment) (hoyer lift, air mattress, all bariatric equipment.)    Recommendations for Other Services       Precautions / Restrictions Precautions Precautions: Fall;Other (comment) Precaution Comments: TC, rectal tube, large buttocks wound, hydro on hold.    Mobility  Bed Mobility Overal bed mobility: Needs Assistance             General bed mobility comments: three person assist to slide up in bed with HOB flat, pt helping with arms, but not legs.  Assist needed to reposition for pain and pressure relief with pillows on his side.  Transfers                    Ambulation/Gait                 Stairs             Wheelchair Mobility    Modified Rankin (Stroke Patients Only)       Balance                                            Cognition Arousal/Alertness: Awake/alert Behavior During Therapy: WFL for tasks  assessed/performed;Anxious Overall Cognitive Status: Within Functional Limits for tasks assessed                                        Exercises      General Comments General comments (skin integrity, edema, etc.): Pt reports he just got his pain under control and did not want to disturb his comfort by turning on his back to tilt and attempt to stand today.  RN and pt report his pain med has been updated and they are trying a different combination of meds.  It sounds like he had a pretty miserable night last night.      Pertinent Vitals/Pain Pain Assessment: Faces Faces Pain Scale: Hurts little more Pain Location: buttocks and back, bil legs Pain Descriptors / Indicators: Grimacing;Guarding Pain Intervention(s): Limited activity within patient's tolerance;Monitored during session;Repositioned;Premedicated before session    Home Living                      Prior Function  PT Goals (current goals can now be found in the care plan section) Acute Rehab PT Goals Patient Stated Goal: to control pain so he can start being more aggressive with therapy. Progress towards PT goals: Not progressing toward goals - comment (limited by pain today)    Frequency    Min 3X/week (for tilting purposes and to cover spread with OT)      PT Plan Current plan remains appropriate    Co-evaluation              AM-PAC PT "6 Clicks" Mobility   Outcome Measure  Help needed turning from your back to your side while in a flat bed without using bedrails?: Total Help needed moving from lying on your back to sitting on the side of a flat bed without using bedrails?: Total Help needed moving to and from a bed to a chair (including a wheelchair)?: Total Help needed standing up from a chair using your arms (e.g., wheelchair or bedside chair)?: Total Help needed to walk in hospital room?: Total Help needed climbing 3-5 steps with a railing? : Total 6 Click  Score: 6    End of Session Equipment Utilized During Treatment: Oxygen Activity Tolerance: Patient limited by pain Patient left: in bed;with call bell/phone within reach;with family/visitor present   PT Visit Diagnosis: Muscle weakness (generalized) (M62.81);Other abnormalities of gait and mobility (R26.89)     Time: 1250-1319 PT Time Calculation (min) (ACUTE ONLY): 29 min  Charges:  $Therapeutic Activity: 23-37 mins                     Corinna Capra, PT, DPT  Acute Rehabilitation [phone] pager [phone]) [phone] office

## 2020-07-10 DIAGNOSIS — Z930 Tracheostomy status: Secondary | ICD-10-CM | POA: Diagnosis not present

## 2020-07-10 DIAGNOSIS — J9602 Acute respiratory failure with hypercapnia: Secondary | ICD-10-CM | POA: Diagnosis not present

## 2020-07-10 DIAGNOSIS — J9601 Acute respiratory failure with hypoxia: Secondary | ICD-10-CM | POA: Diagnosis not present

## 2020-07-10 LAB — CBC
HCT: 29.9 % — ABNORMAL LOW (ref 39.0–52.0)
Hemoglobin: 8.8 g/dL — ABNORMAL LOW (ref 13.0–17.0)
MCH: 27.6 pg (ref 26.0–34.0)
MCHC: 29.4 g/dL — ABNORMAL LOW (ref 30.0–36.0)
MCV: 93.7 fL (ref 80.0–100.0)
Platelets: 282 10*3/uL (ref 150–400)
RBC: 3.19 MIL/uL — ABNORMAL LOW (ref 4.22–5.81)
RDW: 16 % — ABNORMAL HIGH (ref 11.5–15.5)
WBC: 9.7 10*3/uL (ref 4.0–10.5)
nRBC: 0.2 % (ref 0.0–0.2)

## 2020-07-10 LAB — BASIC METABOLIC PANEL
Anion gap: 11 (ref 5–15)
BUN: 31 mg/dL — ABNORMAL HIGH (ref 6–20)
CO2: 27 mmol/L (ref 22–32)
Calcium: 8.6 mg/dL — ABNORMAL LOW (ref 8.9–10.3)
Chloride: 99 mmol/L (ref 98–111)
Creatinine, Ser: 1.04 mg/dL (ref 0.61–1.24)
GFR, Estimated: >60 mL/min (ref >60)
Glucose, Bld: 183 mg/dL — ABNORMAL HIGH (ref 70–99)
Potassium: 4.1 mmol/L (ref 3.5–5.1)
Sodium: 137 mmol/L (ref 135–145)

## 2020-07-10 LAB — GLUCOSE, CAPILLARY
Glucose-Capillary: 128 mg/dL — ABNORMAL HIGH (ref 70–99)
Glucose-Capillary: 198 mg/dL — ABNORMAL HIGH (ref 70–99)
Glucose-Capillary: 225 mg/dL — ABNORMAL HIGH (ref 70–99)
Glucose-Capillary: 240 mg/dL — ABNORMAL HIGH (ref 70–99)
Glucose-Capillary: 243 mg/dL — ABNORMAL HIGH (ref 70–99)
Glucose-Capillary: 291 mg/dL — ABNORMAL HIGH (ref 70–99)

## 2020-07-10 LAB — PROTIME-INR
INR: 2.6 — ABNORMAL HIGH (ref 0.8–1.2)
Prothrombin Time: 27.3 seconds — ABNORMAL HIGH (ref 11.4–15.2)

## 2020-07-10 MED ORDER — PROSOURCE TF PO LIQD: 45.0000 mL | Freq: Two times a day (BID) | ORAL | Status: DC

## 2020-07-10 MED ORDER — WARFARIN SODIUM 10 MG PO TABS
10.0000 mg | ORAL_TABLET | Freq: Once | ORAL | Status: AC
  Administered 2020-07-10: 10 mg
  Filled 2020-07-10: qty 1

## 2020-07-10 MED ORDER — INSULIN ASPART 100 UNIT/ML ~~LOC~~ SOLN
0.0000 [IU] | Freq: Three times a day (TID) | SUBCUTANEOUS | Status: DC
  Administered 2020-07-11 (×2): 3 [IU] via SUBCUTANEOUS
  Administered 2020-07-12: 4 [IU] via SUBCUTANEOUS
  Administered 2020-07-12: 11 [IU] via SUBCUTANEOUS
  Administered 2020-07-12 – 2020-07-13 (×2): 4 [IU] via SUBCUTANEOUS
  Administered 2020-07-13: 11 [IU] via SUBCUTANEOUS
  Administered 2020-07-13: 7 [IU] via SUBCUTANEOUS
  Administered 2020-07-14: 3 [IU] via SUBCUTANEOUS
  Administered 2020-07-14 – 2020-07-15 (×4): 4 [IU] via SUBCUTANEOUS
  Administered 2020-07-16 (×2): 3 [IU] via SUBCUTANEOUS
  Administered 2020-07-16 – 2020-07-17 (×2): 7 [IU] via SUBCUTANEOUS
  Administered 2020-07-17 (×2): 4 [IU] via SUBCUTANEOUS
  Administered 2020-07-18: 3 [IU] via SUBCUTANEOUS
  Administered 2020-07-18: 4 [IU] via SUBCUTANEOUS
  Administered 2020-07-18: 7 [IU] via SUBCUTANEOUS
  Administered 2020-07-19 (×2): 3 [IU] via SUBCUTANEOUS
  Administered 2020-07-19 – 2020-07-20 (×2): 4 [IU] via SUBCUTANEOUS
  Administered 2020-07-20: 3 [IU] via SUBCUTANEOUS
  Administered 2020-07-20 – 2020-07-21 (×3): 4 [IU] via SUBCUTANEOUS
  Administered 2020-07-21 – 2020-07-22 (×2): 3 [IU] via SUBCUTANEOUS
  Administered 2020-07-22 (×2): 4 [IU] via SUBCUTANEOUS
  Administered 2020-07-23: 3 [IU] via SUBCUTANEOUS
  Administered 2020-07-23 (×2): 4 [IU] via SUBCUTANEOUS
  Administered 2020-07-24 – 2020-07-25 (×4): 3 [IU] via SUBCUTANEOUS
  Administered 2020-07-25: 4 [IU] via SUBCUTANEOUS
  Administered 2020-07-25: 3 [IU] via SUBCUTANEOUS
  Administered 2020-07-26: 4 [IU] via SUBCUTANEOUS
  Administered 2020-07-26 (×2): 3 [IU] via SUBCUTANEOUS
  Administered 2020-07-27 (×2): 4 [IU] via SUBCUTANEOUS
  Administered 2020-07-28 (×2): 3 [IU] via SUBCUTANEOUS
  Administered 2020-07-28: 4 [IU] via SUBCUTANEOUS
  Administered 2020-07-29 (×2): 3 [IU] via SUBCUTANEOUS
  Administered 2020-07-29: 7 [IU] via SUBCUTANEOUS
  Administered 2020-07-30: 4 [IU] via SUBCUTANEOUS
  Administered 2020-07-30 – 2020-07-31 (×4): 3 [IU] via SUBCUTANEOUS
  Administered 2020-07-31: 4 [IU] via SUBCUTANEOUS
  Administered 2020-08-01 (×2): 3 [IU] via SUBCUTANEOUS
  Administered 2020-08-02: 4 [IU] via SUBCUTANEOUS
  Administered 2020-08-02: 3 [IU] via SUBCUTANEOUS
  Administered 2020-08-03 – 2020-08-05 (×5): 4 [IU] via SUBCUTANEOUS

## 2020-07-10 MED ORDER — JEVITY 1.5 CAL/FIBER PO LIQD
1000.0000 mL | ORAL | Status: DC
  Filled 2020-07-10 (×2): qty 1000

## 2020-07-10 MED ORDER — INSULIN ASPART 100 UNIT/ML ~~LOC~~ SOLN
16.0000 [IU] | Freq: Three times a day (TID) | SUBCUTANEOUS | Status: DC
  Administered 2020-07-11 – 2020-07-13 (×8): 16 [IU] via SUBCUTANEOUS

## 2020-07-10 MED ORDER — PROSOURCE TF PO LIQD
90.0000 mL | Freq: Three times a day (TID) | ORAL | Status: DC
  Filled 2020-07-10 (×4): qty 90

## 2020-07-10 MED ORDER — INSULIN ASPART 100 UNIT/ML ~~LOC~~ SOLN
0.0000 [IU] | Freq: Every day | SUBCUTANEOUS | Status: DC
  Administered 2020-07-10 – 2020-07-12 (×3): 2 [IU] via SUBCUTANEOUS
  Administered 2020-07-13: 4 [IU] via SUBCUTANEOUS
  Administered 2020-07-14: 2 [IU] via SUBCUTANEOUS

## 2020-07-10 MED ORDER — WARFARIN SODIUM 10 MG PO TABS: 10.0000 mg | ORAL_TABLET | Freq: Once | ORAL | Status: DC

## 2020-07-10 NOTE — Plan of Care (Signed)
  Problem: Safety: Goal: Ability to remain free from injury will improve Outcome: Progressing   

## 2020-07-10 NOTE — Plan of Care (Signed)
  Problem: Education: Goal: Knowledge of General Education information will improve Description: Including pain rating scale, medication(s)/side effects and non-pharmacologic comfort measures Outcome: Progressing   Problem: Health Behavior/Discharge Planning: Goal: Ability to manage health-related needs will improve Outcome: Progressing   Problem: Clinical Measurements: Goal: Ability to maintain clinical measurements within normal limits will improve Outcome: Progressing Goal: Diagnostic test results will improve Outcome: Progressing Goal: Respiratory complications will improve Outcome: Progressing Goal: Cardiovascular complication will be avoided Outcome: Progressing   Problem: Nutrition: Goal: Adequate nutrition will be maintained Outcome: Progressing   Problem: Coping: Goal: Level of anxiety will decrease Outcome: Progressing   Problem: Elimination: Goal: Will not experience complications related to bowel motility Outcome: Progressing Goal: Will not experience complications related to urinary retention Outcome: Progressing   

## 2020-07-10 NOTE — Progress Notes (Signed)
Nutrition Follow-up  DOCUMENTATION CODES:   Morbid obesity  INTERVENTION:   -D/c Pivot 1.5 -Initiate Jevity 1.5 @ 65 ml/hr via cortrak tube  90 ml Prostat TID.    Tube feeding regimen provides 2580 kcal (100% of needs), 166 grams of protein, and 1185 ml of H2O.   -1 packet Juven BID via cortrak tube, each packet provides 95 calories, 2.5 grams of protein (collagen), and 9.8 grams of carbohydrate (3 grams sugar); also contains 7 grams of L-arginine and L-glutamine, 300 mg vitamin C, 15 mg vitamin E, 1.2 mcg vitamin B-12, 9.5 mg zinc, 200 mg calcium, and 1.5 g  Calcium Beta-hydroxy-Beta-methylbutyrate to support wound healing  NUTRITION DIAGNOSIS:   Increased nutrient needs related to wound healing as evidenced by estimated needs.  Ongoing  GOAL:   Patient will meet greater than or equal to 90% of their needs  Progressing   MONITOR:   Vent status,Labs,TF tolerance,Skin  REASON FOR ASSESSMENT:   Consult Enteral/tube feeding initiation and management  ASSESSMENT:   50 yo male admitted with hyperglycemia, severe septic shock r/t Fournier's gangrene, AKI. PMH includes morbid obesity, HTN, DM, chronic LE wounds (L heel, L calf) A fib, laparoscopic gastric sleeve resection.  1/26- s/p tracheostomy 2/4- transitioned to trach collar 2/8- s/p FEES- advanced to full liquid diet  Reviewed I/O's: -810 ml x 24 hours and -33.3 L since 06/26/20  UOP: 2.9 L x 24 hours  Rectal tube output: 150 x 24 hours  Pt receiving nursing care at time of visit.   Pt advanced to full liquid diet yesterday, however, no meal intake data available at assess at this time. Per SLP notes, cortrak may be removed if pt able to progress to solid diet.   Pt remains on TF via cortrak tube: Pivot 1.5 @ 70 ml/hr, and 90 ml Prosurce TF QID which provides 2840 kcals, 246 grams protein, and 1260 ml free water daily.   Labs reviewed: CBGS: 128-208 (inpatient orders for glycemic control are 0-20 units insulin  aspart every 4 hours, 12 units insulin aspart every 4 hours, and 42 units insulin detemir BID).   Diet Order:   Diet Order            DIET DYS 3 Room service appropriate? Yes; Fluid consistency: Thin  Diet effective now                 EDUCATION NEEDS:   Not appropriate for education at this time  Skin:  Skin Assessment: Skin Integrity Issues: Skin Integrity Issues:: Stage II,Other (Comment) Stage II: R heel Other: necrotizing infection to pelvis, anus, rectum  Last BM:  07/09/20 (via rectal tube)  Height:   Ht Readings from Last 1 Encounters:  06/14/20 5\' 10"  (1.778 m)    Weight:   Wt Readings from Last 1 Encounters:  07/10/20 (!) 242.6 kg    Ideal Body Weight:  75.5 kg  BMI:  Body mass index is 76.74 kg/m.  Estimated Nutritional Needs:   Kcal:  09/07/20  Protein:  150-190 grams  Fluid:  > 2 L    [id number], RD, LDN, CDCES Registered Dietitian II Certified Diabetes Care and Education Specialist Please refer to [facility] for RD and/or RD on-call/weekend/after hours pager

## 2020-07-10 NOTE — Progress Notes (Signed)
  Speech Language Pathology Treatment: Dysphagia  Patient Details Name: Carl Hill MRN: [id number] DOB: 08-Dec-1970 Today's Date: 07/10/2020 Time: 1415-1430 SLP Time Calculation (min) (ACUTE ONLY): 15 min  Assessment / Plan / Recommendation Clinical Impression  Pt toelrating liquid diet well, typically laying at 15 -20 degrees sidelying. Observed pt with upgraded trial of solids. He is much more comfortable today in this position and able to mastication with patience, follow bites with sips. Recommend upgrade to mech soft. Asked RN to give meds whole with water at next opportunity. If pt tolerates he is ready for NG tube removal. PMSV being used appropriately, pt 100 % intelligible. Will f/u as needed.   HPI HPI: Per pulmonary note, "50 year old male who presented with buttock pain, fatigue and malaise.  Found to have hyperglycemia. CT pelvis demonstrated necrotizing fasciitis in L buttock, L upper thigh, and perineum. Taken to OR for debridement 1/10 c/b Afib with RVR and VT arrest s/p CPR/defib. Trached 1/26".  Pt was on full vent support until yesterday when he was put on trach collar.  Over night placed on vent but has been on trach collar all day today.  PMSV and swallow eval ordered.  Last CXR showed 1/31 IMPRESSION:  1. Shifting atelectasis, improved in the right upper lobe but  increased at the right base when compared to yesterday.  2. Visible hardware in unchanged position.      SLP Plan  Continue with current plan of care       Recommendations  Diet recommendations: Regular;Thin liquid      Patient may use Passy-Muir Speech Valve: During all therapies with supervision;Intermittently with supervision         Follow up Recommendations: Skilled Nursing facility SLP Visit Diagnosis: Dysphagia, unspecified (R13.10) Plan: Continue with current plan of care       GO               Harlon Ditty, MA CCC-SLP  Acute Rehabilitation Services Pager [phone] Office  [phone]  Claudine Mouton 07/10/2020, 3:05 PM

## 2020-07-10 NOTE — Progress Notes (Addendum)
NAME:  Carl Hill, MRN:  [id number], DOB:  Mar 05, 1971, LOS: 67 ADMISSION DATE:  06/10/2020, CONSULTATION DATE:  06/10/2020 REFERRING MD:  Lorin Mercy, CHIEF COMPLAINT:  Severe Sepsis    Brief History:  50 year old male who presented with buttock pain, fatigue and malaise.  Found to have hyperglycemia. CT pelvis demonstrated necrotizing fasciitis in L buttock, L upper thigh, and perineum. Taken to OR for debridement 1/10 c/b Afib with RVR and VT arrest s/p CPR/defib. Trached 1/26, remains on full vent support.  Past Medical History:  HTN, obesity, DM, AFib on DOAC  Significant Hospital Events:  1/10  Admitted to [facility]. General surgery consulted and debridement pursued. In the afternoon, patient developed hypotension with AF in RVR. Synchronized cardioversion unsuccessful with conversion to VT. CPR initated with ROSC after desynchronized defibrillation. 1/11 Debridement 1/18 Mucus plugging 1/19 Bronchoscopy >> mucus plug on Rt, airway collapse with exhalation 1/20 Persistent fever, change ABx 1/24 Increased vent requirements, habitus related +/- mucus plugging 1/25 Hypotensive to SBP 80s, Levo resumed, minimally responsive overnight off sedation, CT Head negative 1/26 Stable vent/pressor requirements, bronched, tracheostomy 1/29 start hydrotherapy 1/30 start lovenox/coumadin 2/2 Tolerating PS 2/4 did TC for 12 hours 2/5 Did 24 hours TC 2/6 Continue TC  2/7 Try dilaudid with turns/dressing change for better pain control - longer acting, fentanyl IV d/c'd 2/8 Increase standing oxy, dilaudid PRN for turns/dressing changes, downgrade to progressive  Consults:  General Surgery ID s/o 1/25  Procedures:  ETT 1/10 >> 1/26 1/26 Tstomy >> R IJ CVL 1/10 >> out L brachial A-line 1/10 >>out   Significant Diagnostic Tests:   CT abd/pelvis 1/10 >> Extensive subcutaneous emphysema in the medial aspect of the upper thighs extending through perineum and superiorly to the medial left buttock. Gas  extends into the pelvis surrounding the anus inferiorly and tracking superiorly along the left wall of the rectum.  CT C/A/P 1/22 >> Imaging limited due to body habitus. No gross fluid collection or gas seen. Bilateral consolidation with dependent atelectasis  CT Head 1/25 >> No acute intracranial abnormality  Micro Data:  COVID/Flu 1/10 >> negative MRSA PCR 1/10 >> negative L buttocks wound 1/10 >> E. Coli (pansensitive), Streptococcus infantarious (sensitive to PCN, ceftriaxone, vanc), Peptostreptococcus species, rare GBS Blood 1/10 >> negative BAL 1/20 >> normal flora Resp Cx 1/26 > Normal flora  Antimicrobials:  Aztreonam 1/09 Metronidazole 1/09 Vancomycin 1/09, 1/11, 1/20 >>1/22 Cefepime 1/10 >> 1/13, 1/20 >>1/22 Clindamycin 1/10 >> 1/13 Ceftriaxone 1/13 >> 1/20 Metronidazole 1/17 >> 1/22 Meropenem 1/22 >> 1/25 Amoxicillin 1/24 (x 1 for PCN Allergy challenge) Unasyn 1/25 >> 1/27 Augmentin 1/27 >>  Interim History / Subjective:   Lying supine in bed on right lateral decubitus position Able to speak in full sentences, no distress On 40% FiO2 trach collar    Objective   Blood pressure 120/82, pulse (!) 114, temperature (!) 97.5 F (36.4 C), temperature source Oral, resp. rate 20, height _0  (1.778 m), weight (!) 242.6 kg, SpO2 97 %.    FiO2 (%):  [28 %-40 %] 28 %   Intake/Output Summary (Last 24 hours) at 07/10/2020 1121 Last data filed at 07/10/2020 0957 Gross per 24 hour  Intake 2243 ml  Output 5250 ml  Net -3007 ml   Filed Weights   07/07/20 0407 07/08/20 0500 07/10/20 0304  Weight: (!) 239.7 kg (!) 239.7 kg (!) 242.6 kg   Physical Exam: General: Morbidly obese, no acute distress HENT: [place], AT, OP clear, MMM, able  to phonate well with Passy-Muir valve Neck: Trach in place, c/d/i Eyes: EOMI, no scleral icterus Respiratory: Decreased breath sounds bilateral, no accessory muscle use Cardiovascular: RRR, -M/R/G, no JVD GI: BS+, soft, nontender Neuro: Alert,  interactive, nonfocal Psych: Normal mood, normal affect GU: Foley & Flexi-Seal   Chest x-ray 1/31 independently reviewed shows right lower lobe atelectasis, previous right upper lobe has better Labs show no leukocytosis, stable anemia, normal electrolytes  Resolved problems:  VT arrest 1/10, Ileus 1/15, Elevated LFTs from shock, Metabolic acidosis with lactic acidosis, Septic shock Acute metabolic encephalopathy 2nd to sepsis, hypoxia, hypercapnia.\ Hypernatremia from diuresis and insensible losses - improved   Assessment & Plan:   Acute on chronic hypoxic/hypercapnic respiratory failure in setting of sepsis, HCAP, recurrent mucus plugging -improving Failure to wean s/p tracheostomy. Bronchomalacia. Probable sleep disordered breathing. -He has chronic compensated hypercarbia and may need nocturnal ventilation, on trach collar since 2/5 -Tolerating trach collar well, decrease FiO2 to 28% - Pulmonary hygiene with nebulizers   - Routine trach care - SLP, PMV doing well   Left buttock necrotizing fasciitis with E coli, Group B Streptococcus, and Peptostreptococcus in wound culture. Bleeding from sacral site - no further issue - Hydrotherapy 1/29-2/3 - Surgery following. No further debridement indicated - Long course of antibiotics, currently on augmentin (6 month plan) - Thrombi pads and TMA soaked dressing for bleed  Acute blood loss anemia, likely from frequent blood draws - S/p 1 U PRBC on 1/31 and 2/3. Appropriate Hg response to transfusion - Trend CBC   Paroxysmal Afib - currently NSR Hx of HLD. - continue amiodarone, lipitor - coumadin per pharmacy. 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  -Per [facility]   Ideally he would need to be on his feet before we can consider decannulation.  PCCM will see next week    Best practice (evaluated daily)  Diet: Continue TF DVT  prophylaxis: lovenox/coumadin GI prophylaxis: Protonix  Mobility: Bedrest Disposition: ICU Family:  Code status: Full Code  Labs    CMP Latest Ref Rng & Units 07/10/2020 07/09/2020 07/08/2020  Glucose 70 - 99 mg/dL 183(H) 186(H) 219(H)  BUN 6 - 20 mg/dL 31(H) 38(H) 37(H)  Creatinine 0.61 - 1.24 mg/dL 1.04 0.97 0.93  Sodium 135 - 145 mmol/L 137 142 144  Potassium 3.5 - 5.1 mmol/L 4.1 4.3 4.2  Chloride 98 - 111 mmol/L 99 105 106  CO2 22 - 32 mmol/L _0 Calcium 8.9 - 10.3 mg/dL 8.6(L) 8.7(L) 8.7(L)  Total Protein 6.5 - 8.1 g/dL - - -  Total Bilirubin 0.3 - 1.2 mg/dL - - -  Alkaline Phos 38 - 126 U/L - - -  AST 15 - 41 U/L - - -  ALT 0 - 44 U/L - - -    CBC Latest Ref Rng & Units 07/10/2020 07/09/2020 07/08/2020  WBC 4.0 - 10.5 K/uL 9.7 10.4 9.2  Hemoglobin 13.0 - 17.0 g/dL 8.8(L) 8.6(L) 8.6(L)  Hematocrit 39.0 - 52.0 % 29.9(L) 28.8(L) 29.0(L)  Platelets 150 - 400 K/uL 282 284 301   ABG    Component Value Date/Time   PHART 7.401 06/23/2020 1204   PCO2ART 57.4 (H) 06/23/2020 1204   PO2ART 73 (L) 06/23/2020 1204   HCO3 35.5 (H) 06/23/2020 1204   TCO2 37 (H) 06/23/2020 1204   ACIDBASEDEF 2.0 06/13/2020 1221   O2SAT 94.0 06/23/2020 1204    CBG (last  3)  Recent Labs    07/10/20 0037 07/10/20 0409 07/10/20 1016  GLUCAP 128* 198* 291*   Signature:   Kara Mead MD. Shade Flood. [facility] Pager : [phone]  If no response to pager , please call [phone] until 7 pm After 7:00 pm call Elink  [phone]   07/10/2020

## 2020-07-10 NOTE — Progress Notes (Signed)
Pt. Refusing to have wound to leg and buttocks redressed  at this time. Pt. States the dressing only needs to be done once daily. RN noted that the dressing to pts.buttocks is Soiled with serosanguineous drainage. Pt. Made aware of drainage to dressing and need for wound care to be done. Pt. also refusing to have dressing reinforced.

## 2020-07-10 NOTE — Progress Notes (Signed)
Patient ate whole meal for dinner without difficulties.  Patient also swallowed his pills whole without any difficulties.  Patient's cortrak removed per order given by MD.

## 2020-07-10 NOTE — Progress Notes (Signed)
Patient has tube feedings infusing.  Patient's head is currently below 30 degrees.  I attempted to elevate patient's head, however patient refused to have his head elevated.  I educated the patient on the importance of having his head elevated while on tube feedings.  Pt still refused to have his head elevated.  Will inform the MD and Charge Nurse.

## 2020-07-10 NOTE — Progress Notes (Signed)
PROGRESS NOTE    Carl Hill  IOE:[id number] DOB: [birth year] DOA: 06/10/2020 PCP: Malka So., MD   Chief Complaint  Patient presents with  . Hyperglycemia    Brief Narrative:  50 year old male with piror h/o HTN, obesity, DM, AFib on DOAC presents with buttock pain, malaise. CT pelvis showed demonstrated necrotizing fasciitis in L buttock, L upper thigh, and perineum. Taken to OR for debridement 1/10 c/b Afib with RVR and VT arrest s/p CPR/defib. Trached 1/26,.  Currently on trach collar. She is transferred to progressive care to [facility] on 07/10/20.  Toc on board for    Assessment & Plan:   Principal Problem:   Fournier gangrene Active Problems:   Severe sepsis with septic shock (HCC)   DKA (diabetic ketoacidosis) (HCC)   AKI (acute kidney injury) (HCC)   Diabetic ulcer of heel (HCC)   Atrial fibrillation, chronic (HCC)   Essential hypertension   Dyslipidemia   Acquired hypothyroidism   Chronic pain disorder   Morbid obesity with BMI of 60.0-69.9, adult (HCC)   Acute respiratory failure (HCC)   Sepsis due to Streptococcus, group B (HCC)   Actinomycosis  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 failure in the setting of sepsis, health care associated pneumonia, recurrent mucus plugging:  Improving.  S/p trach , continue with trach collar.  SLP PMV on ,  - PT eval     Left buttock necrotizing fascitis with E coli , Group B strep, peptostreptococcus in wound culture Hydrotherapy 06/29/20 till 07/04/20 No further debridement.  On augmentin for 6 months.    Hypernatremia from diuresis and insensible losses:  Improved.   Diabetes mellitus:  CBG (last 3)  Recent Labs    07/10/20 0409 07/10/20 1016 07/10/20 1233  GLUCAP 198* 291* 243*   Uncontrolled with hyperglycemia. Stop the cortrak feeds as he is able to take soft diet.    Acute blood loss anemia; from blood loss from wound.  S/p PRBC transfusion. H&H stable around 8.     Hypothyroidism:  Continue with synthroid.    Atrial fibrillation:  Paroxysmal.  Rate controlled.  Therapeutic iNR.  pharmacy to dose coumadin.   Pressure injury present on admission:  Pressure Injury 06/10/20 Heel Right Stage 2 -  Partial thickness loss of dermis presenting as a shallow open injury with a red, pink wound bed without slough. (Active)  06/10/20 1400  Location: Heel  Location Orientation: Right  Staging: Stage 2 -  Partial thickness loss of dermis presenting as a shallow open injury with a red, pink wound bed without slough.  Wound Description (Comments):   Present on Admission: Yes   Wound care consulted .       DVT prophylaxis: (Warfarin) Code Status: (Full Code) Family Communication: Mother at bedside.  Disposition:   Status is: Inpatient  Remains inpatient appropriate because:Ongoing diagnostic testing needed not appropriate for outpatient work up, Unsafe d/c plan and Inpatient level of care appropriate due to severity of illness   Dispo: The patient is from: Home              Anticipated d/c is to: SNF              Anticipated d/c date is: > 3 days              Patient currently is not medically stable to d/c.   Difficult to place patient Yes  Level of care: Progressive   Consultants:   General surgery.  ID  PCCM.    Procedures:  ETT 1/10 >>  R IJ CVL 1/10 >>  L brachial A-line 1/10 >>    Antimicrobials: Aztreonam 1/09 Metronidazole 1/09 Vancomycin 1/09, 1/11, 1/20 >>1/22 Cefepime 1/10 >> 1/13, 1/20 >>1/22 Clindamycin 1/10 >> 1/13 Ceftriaxone 1/13 >> 1/20 Metronidazole 1/17 >> 1/22 Meropenem 1/22 >> 1/25 Amoxicillin 1/24 (x 1 for PCN Allergy challenge) Unasyn 1/25 >> 1/27 Augmentin 1/27 >>  Significant Hospital Events:  1/10  Admitted to [facility]. General surgery consulted and debridement pursued. In the afternoon, patient developed hypotension with AF in RVR. Synchronized cardioversion unsuccessful with conversion to VT.  CPR initated with ROSC after desynchronized defibrillation. 1/11 Debridement 1/18 Mucus plugging 1/19 Bronchoscopy >> mucus plug on Rt, airway collapse with exhalation 1/20 Persistent fever, change ABx 1/24 Increased vent requirements, habitus related +/- mucus plugging 1/25 Hypotensive to SBP 80s, Levo resumed, minimally responsive overnight off sedation, CT Head negative 1/26 Stable vent/pressor requirements, bronched, tracheostomy 1/29 start hydrotherapy 1/30 start lovenox/coumadin 2/2 Tolerating PS 2/4 did TC for 12 hours 2/5 Did 24 hours TC 2/6 Continue TC  2/7 Try dilaudid with turns/dressing change for better pain control - longer acting, fentanyl IV d/c'd 2/8 Increase standing oxy, dilaudid PRN for turns/dressing changes, downgrade to progressive    Subjective: Patient reports his breathing is better  Objective: Vitals:   07/10/20 0801 07/10/20 0849 07/10/20 1000 07/10/20 1123  BP:   120/82   Pulse:  (!) 111 (!) 114 (!) 116  Resp:  18 20   Temp: (!) 97.5 F (36.4 C)  (!) 97.5 F (36.4 C)   TempSrc: Oral  Oral   SpO2:  96% 97%   Weight:      Height:        Intake/Output Summary (Last 24 hours) at 07/10/2020 1333 Last data filed at 07/10/2020 0957 Gross per 24 hour  Intake 2243 ml  Output 5250 ml  Net -3007 ml   Filed Weights   07/07/20 0407 07/08/20 0500 07/10/20 0304  Weight: (!) 239.7 kg (!) 239.7 kg (!) 242.6 kg    Examination:  General exam: Morbidly obese gentleman s/p trach and core track Respiratory system: Clear to auscultation. Respiratory effort normal. Cardiovascular system: S1 & S2 heard, RRR. No JVD, Gastrointestinal system: Abdomen is nondistended, soft and nontender. Normal bowel sounds heard. Central nervous system: Alert and oriented. No focal neurological deficits. Extremities: pedal edema present.  Skin: pressure ulcers on the right heel Psychiatry:  Mood is appropriate.     Data Reviewed: I have personally reviewed following labs  and imaging studies  CBC: Recent Labs  Lab 07/04/20 1644 07/07/20 1027 07/08/20 0105 07/09/20 0125 07/10/20 0333  WBC 9.6 9.0 9.2 10.4 9.7  HGB 8.0* 8.5* 8.6* 8.6* 8.8*  HCT 27.9* 30.1* 29.0* 28.8* 29.9*  MCV 95.9 97.7 95.7 94.1 93.7  PLT 229 283 301 284 282    Basic Metabolic Panel: Recent Labs  Lab 07/04/20 0131 07/05/20 0303 07/06/20 0139 07/07/20 1027 07/08/20 0105 07/09/20 0125 07/10/20 0333  NA 144 146* 144 142 144 142 137  K 3.6 3.5 4.1 4.2 4.2 4.3 4.1  CL 104 105 105 104 106 105 99  CO2 31 30 28 27 26 26 27   GLUCOSE 227* 215* 203* 234* 219* 186* 183*  BUN 43* 41* 40* 36* 37* 38* 31*  CREATININE 1.18 1.14 1.10 1.04 0.93 0.97 1.04  CALCIUM 8.1* 8.8* 8.6* 8.4* 8.7* 8.7* 8.6*  MG 2.0 2.1 2.1  --   --   --   --  GFR: Estimated Creatinine Clearance: 173 mL/min (by C-G formula based on SCr of 1.04 mg/dL).  Liver Function Tests: No results for input(s): AST, ALT, ALKPHOS, BILITOT, PROT, ALBUMIN in the last 168 hours.  CBG: Recent Labs  Lab 07/09/20 2027 07/10/20 0037 07/10/20 0409 07/10/20 1016 07/10/20 1233  GLUCAP 133* 128* 198* 291* 243*     No results found for this or any previous visit (from the past 240 hour(s)).       Radiology Studies: No results found.      Scheduled Meds: . amiodarone  200 mg Per Tube Daily  . amoxicillin-clavulanate  1 tablet Per Tube Q8H  . atorvastatin  20 mg Per Tube QPM  . chlorhexidine  15 mL Mouth Rinse BID  . Chlorhexidine Gluconate Cloth  6 each Topical Daily  . collagenase  1 application Topical Daily  . feeding supplement (PROSource TF)  90 mL Per Tube QID  . insulin aspart  0-20 Units Subcutaneous Q4H  . insulin aspart  12 Units Subcutaneous Q4H  . insulin detemir  42 Units Subcutaneous BID  . levothyroxine  75 mcg Per Tube Daily  . mouth rinse  15 mL Mouth Rinse q12n4p  . nortriptyline  25 mg Per Tube BID  . oxyCODONE  15 mg Oral Q6H  . pantoprazole sodium  40 mg Per Tube Q24H  .  pregabalin  300 mg Per Tube BID  . sodium chloride flush  10-40 mL Intracatheter Q12H  . sodium chloride flush  3 mL Intravenous Q12H  . warfarin  10 mg Per Tube ONCE-1600  . Warfarin - Pharmacist Dosing Inpatient   Does not apply q1600   Continuous Infusions: . sodium chloride Stopped (06/28/20 2035)  . feeding supplement (PIVOT 1.5 CAL) 1,000 mL (07/07/20 0133)     LOS: 30 days        Kathlen Mody, MD [facility]   To contact the attending provider between 7A-7P or the covering provider during after hours 7P-7A, please log into the web site [email or web address] and access using universal [place] password for that web site. If you do not have the password, please call the hospital operator.  07/10/2020, 1:33 PM

## 2020-07-10 NOTE — TOC Initial Note (Addendum)
Transition of Care [facility]) - Initial/Assessment Note    Patient Details  Name: Carl Hill MRN: [id number] Date of Birth: 08/29/1970  [facility]) CM/SW Contact:    Bethann Berkshire, [place] Phone Number: 07/10/2020, 3:19 PM  Clinical Narrative:                  CSW met with pt and pt mother bedside. CSW introduced himself and role that TOC play in cares. Pt and mother provide some history of pt. Pt lived in [place] independently up until summer of 2021. He then moved in with his mother while working on disability application/approval. Pt was previously able to ambulate using a cane but began to have worsening wounds. Pt is agreeable to SNF at this time. CSW explains barriers including trach, coretrak, bariatric needs, and medicaid.    Pt will need to be 21 days out for getting trach and has to be below 28% for at least 24 hours prior to any SNF's taking pt. Got tracheostomy on 1/26. His 21 day mark will be 2/16. Pt to be faxed out to tracheostomy SNF's on 2/16  Expected Discharge Plan: [place] Barriers to Discharge: Continued Medical Work up,SNF Pending bed offer,Inadequate or no insurance   Patient Goals and CMS Choice Patient states their goals for this hospitalization and ongoing recovery are:: Pt wants SNF      Expected Discharge Plan and Services Expected Discharge Plan: [place]     Post Acute Care Choice: [place]                                        Prior Living Arrangements/Services   Lives with:: Parents Patient language and need for interpreter reviewed:: Yes        Need for Family Participation in Patient Care: Yes (Comment) Care giver support system in place?: No (comment)   Criminal Activity/Legal Involvement Pertinent to Current Situation/Hospitalization: No - Comment as needed  Activities of Daily Living      Permission Sought/Granted   Permission granted to share information  with : Yes, Verbal Permission Granted  Share Information with NAME: Tammy, Ericsson (Mother)   [phone] (Mobile)           Emotional Assessment Appearance:: Appears stated age Attitude/Demeanor/Rapport: Engaged Affect (typically observed): Pleasant Orientation: : Oriented to Self,Oriented to Place,Oriented to  Time,Oriented to Situation Alcohol / Substance Use: Not Applicable Psych Involvement: No (comment)  Admission diagnosis:  Weakness [R53.1] Hyperglycemia [R73.9] AKI (acute kidney injury) ([place]) [N17.9] Sepsis ([place]) [A41.9] Sepsis, due to unspecified organism, unspecified whether acute organ dysfunction present [facility]) [A41.9] Patient Active Problem List   Diagnosis Date Noted  . Sepsis due to Streptococcus, group B ([place]) 06/22/2020  . 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 Acute respiratory failure ([place])   . Severe sepsis with septic shock ([place]) 06/10/2020  . Fournier gangrene 06/10/2020  . DKA (diabetic ketoacidosis) ([place]) 06/10/2020  . AKI (acute kidney injury) ([place]) 06/10/2020  . Diabetic ulcer of heel ([place]) 06/10/2020  . Atrial fibrillation, chronic ([place]) 06/10/2020  . Essential hypertension 06/10/2020  . Dyslipidemia 06/10/2020  . Acquired hypothyroidism 06/10/2020  . Chronic pain disorder 06/10/2020  . Morbid obesity with BMI of 60.0-69.9, adult ([place]) 06/10/2020   PCP:  Lilian Coma., MD Pharmacy:   [facility] [address] [place]  [place] [address] Phone: [phone] Fax: [phone]     Social Determinants of Health (SDOH) Interventions    Readmission Risk Interventions No flowsheet data found.

## 2020-07-10 NOTE — Progress Notes (Signed)
ANTICOAGULATION CONSULT NOTE  Pharmacy Consult for Warfarin  Indication: atrial fibrillation  Patient Measurements: Height: 5\' 10"  (177.8 cm) Weight: (!) 242.6 kg (534 lb 13.4 oz) IBW/kg (Calculated) : 73 Heparin Dosing Weight: 149.7 kg  Vital Signs: Temp: 97.5 F (36.4 C) (02/09 1000) Temp Source: Oral (02/09 1000) BP: 120/82 (02/09 1000) Pulse Rate: 116 (02/09 1123)  Labs: Recent Labs    07/08/20 0105 07/09/20 0125 07/10/20 0333  HGB 8.6* 8.6* 8.8*  HCT 29.0* 28.8* 29.9*  PLT 301 284 282  LABPROT 34.0* 28.8* 27.3*  INR 3.5* 2.8* 2.6*  CREATININE 0.93 0.97 1.04    Estimated Creatinine Clearance: 173 mL/min (by C-G formula based on SCr of 1.04 mg/dL).   Assessment: 49YOM admitted with septic shock secondary to necrotizing fascitis, on IV heparin for Afib. Was on xarelto PTA, but that is not appropriate based on patient's weight. Previously bridging warfarin with lovenox until INR therapeutic. LMWH level was elevated at 1.25 on 1/31 - dose was decreased to 225mg  q12hr. Patient with some sacral wound bleeding on 2/1 PM and received topical TXA/thrombi pads.  Lovenox discontinue on 07/03/20 since INR was>2.     Warfarin started on 06/30/20.  Held warfarin 07/05/20 thru 07/08/20 due to supratherapeutic INR > resumed Warfarin on 07/09/20 once INR was back within 2-3 goal. Today 2/9 the INR remains therapeutic at 2.6.   Hgb stable at 8.8 and pltc is stable within normal range.  No bleeding noted. Potential drug-drug interaction with amiodarone, may increase hypoprothrombinemic effect of warfarin.   Goal of Therapy:  INR 2 - 3 Monitor platelets by anticoagulation protocol: Yes   Plan:  Warfarin 10mg  x1 per tube Daily INR and CBC  09/06/20, RPh Clinical Pharmacist Phone between 7 am - 3:30 pm: 4/9  Please check AMION for all [facility] phone numbers After 10:00 PM, call Main Pharmacy [phone]  07/10/2020, 11:57 AM

## 2020-07-10 NOTE — Progress Notes (Signed)
This RN in room to assist with position change. Patient refused for primary RN to do wound care.  Patient states that he only needs his dressing done once per day. Patient refused to have dressing reinforced as well.

## 2020-07-10 NOTE — Progress Notes (Signed)
Physical Therapy Treatment Patient Details Name: Carl Hill MRN: [id number] DOB: 09-30-1970 Today's Date: 07/10/2020    History of Present Illness Pt is a 50 year old male who presented with buttock pain, fatigue and malaise.  Found to have hyperglycemia. CT pelvis demonstrated necrotizing fasciitis in L buttock, L upper thigh, and perineum. Taken to OR for debridement 1/10 c/b Afib with RVR and VT arrest s/p CPR/defib. Trached 1/26, remains on full vent support. PMHx: HTN, obesity, DM, AFib.  As of 07/04/20 started TC trials.    PT Comments    Immediately on arrival pt asking to defer verticalization therapy today. Actually asking for 2 days "off" to get his schedule worked out to help with pain management. Today his dressing change was finished ~12:30 and pt in too much pain from buttock wound to participate. Lengthy discussion with pt, mother, and [facility] bed representative to come up with a time of day/schedule that pt would commit to. Decided on 8:30 a.m. each morning. Patient educated on importance of AROM and performed several exercises with PT. Discussed importance of positioning on either side to reduce pressure on painful wound. Pt reports he tries to get nursing to assist him to roll over q 3 hours.    Follow Up Recommendations  SNF     Equipment Recommendations  Hospital bed;Other (comment) (hoyer lift, air mattress, all bariatric equipment.)    Recommendations for Other Services       Precautions / Restrictions Precautions Precautions: Fall;Other (comment) Precaution Comments: TC, rectal tube, large buttocks wound, hydro on hold. Required Braces or Orthoses:  (PRAFO-?not working per pt)    Mobility  Bed Mobility                  Transfers                    Ambulation/Gait                 Stairs             Wheelchair Mobility    Modified Rankin (Stroke Patients Only)       Balance                                             Cognition Arousal/Alertness: Awake/alert Behavior During Therapy: WFL for tasks assessed/performed;Anxious Overall Cognitive Status: Within Functional Limits for tasks assessed                                 General Comments: talking with PMSV; too painful to participate with verticalization as just completed wound dressing change      Exercises General Exercises - Lower Extremity Ankle Circles/Pumps: AROM;Both;5 reps Other Exercises Other Exercises: educated to perform AROM bil UEs and to work on rolling by pulling on rails. Discussed need to work on extensor muscles and OT to provide theraband Other Exercises: heelslides x 5 reps each AROM Other Exercises: hip flexion RLE (while pt lying on left side) followed by rt hip IR; educated to complete same when lying on his right side    General Comments        Pertinent Vitals/Pain Pain Assessment: Faces Faces Pain Scale: Hurts whole lot Pain Location: buttocks and back, bil legs Pain Descriptors / Indicators: Guarding;Aching;Constant Pain Intervention(s): Limited activity within  patient's tolerance;Premedicated before session    Home Living                      Prior Function            PT Goals (current goals can now be found in the care plan section) Acute Rehab PT Goals Patient Stated Goal: to control pain so he can start being more aggressive with therapy. PT Goal Formulation: With patient/family Time For Goal Achievement: 07/16/20 Potential to Achieve Goals: Fair    Frequency    Min 3X/week (for tilting purposes and to cover spread with OT)      PT Plan Current plan remains appropriate    Co-evaluation              AM-PAC PT "6 Clicks" Mobility   Outcome Measure  Help needed turning from your back to your side while in a flat bed without using bedrails?: Total Help needed moving from lying on your back to sitting on the side of a flat bed without using  bedrails?: Total Help needed moving to and from a bed to a chair (including a wheelchair)?: Total Help needed standing up from a chair using your arms (e.g., wheelchair or bedside chair)?: Total Help needed to walk in hospital room?: Total Help needed climbing 3-5 steps with a railing? : Total 6 Click Score: 6    End of Session Equipment Utilized During Treatment: Oxygen Activity Tolerance: Patient limited by pain Patient left: in bed;with call bell/phone within reach;with family/visitor present   PT Visit Diagnosis: Muscle weakness (generalized) (M62.81);Other abnormalities of gait and mobility (R26.89)     Time: 1300-1346 PT Time Calculation (min) (ACUTE ONLY): 46 min  Charges:  $Therapeutic Exercise: 8-22 mins $Self Care/Home Management: 8-22                      Jerolyn Center, PT Pager [phone]    Zena Amos 07/10/2020, 2:32 PM

## 2020-07-10 NOTE — Plan of Care (Signed)
  Problem: Clinical Measurements: Goal: Respiratory complications will improve Outcome: Progressing   

## 2020-07-10 NOTE — Progress Notes (Signed)
Pt. Alert and in stable condition. Telemetry leads replaced on pt., Telemetry holder attached to pts. Bed to keep leads in place. Pt. Requesting a cup of Ice/ginger ale/water, given to pt.

## 2020-07-11 DIAGNOSIS — J9602 Acute respiratory failure with hypercapnia: Secondary | ICD-10-CM | POA: Diagnosis not present

## 2020-07-11 DIAGNOSIS — Z930 Tracheostomy status: Secondary | ICD-10-CM | POA: Diagnosis not present

## 2020-07-11 DIAGNOSIS — J9601 Acute respiratory failure with hypoxia: Secondary | ICD-10-CM | POA: Diagnosis not present

## 2020-07-11 LAB — BASIC METABOLIC PANEL
Anion gap: 11 (ref 5–15)
BUN: 25 mg/dL — ABNORMAL HIGH (ref 6–20)
CO2: 26 mmol/L (ref 22–32)
Calcium: 8.3 mg/dL — ABNORMAL LOW (ref 8.9–10.3)
Chloride: 96 mmol/L — ABNORMAL LOW (ref 98–111)
Creatinine, Ser: 0.99 mg/dL (ref 0.61–1.24)
GFR, Estimated: >60 mL/min (ref >60)
Glucose, Bld: 199 mg/dL — ABNORMAL HIGH (ref 70–99)
Potassium: 4.2 mmol/L (ref 3.5–5.1)
Sodium: 133 mmol/L — ABNORMAL LOW (ref 135–145)

## 2020-07-11 LAB — GLUCOSE, CAPILLARY
Glucose-Capillary: 207 mg/dL — ABNORMAL HIGH (ref 70–99)
Glucose-Capillary: 149 mg/dL — ABNORMAL HIGH (ref 70–99)
Glucose-Capillary: 128 mg/dL — ABNORMAL HIGH (ref 70–99)
Glucose-Capillary: 112 mg/dL — ABNORMAL HIGH (ref 70–99)
Glucose-Capillary: 224 mg/dL — ABNORMAL HIGH (ref 70–99)

## 2020-07-11 LAB — PROTIME-INR
INR: 3.4 — ABNORMAL HIGH (ref 0.8–1.2)
Prothrombin Time: 33.3 seconds — ABNORMAL HIGH (ref 11.4–15.2)

## 2020-07-11 LAB — CBC
HCT: 27.8 % — ABNORMAL LOW (ref 39.0–52.0)
Hemoglobin: 8.7 g/dL — ABNORMAL LOW (ref 13.0–17.0)
MCH: 28.2 pg (ref 26.0–34.0)
MCHC: 31.3 g/dL (ref 30.0–36.0)
MCV: 90.3 fL (ref 80.0–100.0)
Platelets: 263 10*3/uL (ref 150–400)
RBC: 3.08 MIL/uL — ABNORMAL LOW (ref 4.22–5.81)
RDW: 15.6 % — ABNORMAL HIGH (ref 11.5–15.5)
WBC: 10.2 10*3/uL (ref 4.0–10.5)
nRBC: 0 % (ref 0.0–0.2)

## 2020-07-11 MED ORDER — ENSURE MAX PROTEIN PO LIQD
11.0000 [oz_av] | Freq: Every day | ORAL | Status: DC
  Administered 2020-07-12 – 2020-08-05 (×22): 11 [oz_av] via ORAL
  Filled 2020-07-11 (×29): qty 330

## 2020-07-11 MED ORDER — ENSURE ENLIVE PO LIQD
237.0000 mL | Freq: Two times a day (BID) | ORAL | Status: DC
  Administered 2020-07-11 – 2020-08-05 (×48): 237 mL via ORAL

## 2020-07-11 MED ORDER — ADULT MULTIVITAMIN W/MINERALS CH
1.0000 | ORAL_TABLET | Freq: Every day | ORAL | Status: DC
  Administered 2020-07-11 – 2020-08-05 (×26): 1 via ORAL
  Filled 2020-07-11 (×27): qty 1

## 2020-07-11 NOTE — Progress Notes (Signed)
PROGRESS NOTE    Carl Hill  BHA:[id number] DOB: 09/05/1970 DOA: 06/10/2020 PCP: Malka So., MD   Chief Complaint  Patient presents with  . Hyperglycemia    Brief Narrative:  50 year old male with piror h/o HTN, obesity, DM, AFib on DOAC presents with buttock pain, malaise. CT pelvis showed demonstrated necrotizing fasciitis in L buttock, L upper thigh, and perineum. Taken to OR for debridement 1/10 c/b Afib with RVR and VT arrest s/p CPR/defib. Trached 1/26,.  Currently on trach collar. He was transferred to progressive care to [facility] on 07/10/20.  Toc on board for placement.     Assessment & Plan:   Principal Problem:   Fournier gangrene Active Problems:   Severe sepsis with septic shock (HCC)   DKA (diabetic ketoacidosis) (HCC)   AKI (acute kidney injury) (HCC)   Diabetic ulcer of heel (HCC)   Atrial fibrillation, chronic (HCC)   Essential hypertension   Dyslipidemia   Acquired hypothyroidism   Chronic pain disorder   Morbid obesity with BMI of 60.0-69.9, adult (HCC)   Acute respiratory failure (HCC)   Sepsis due to Streptococcus, group B (HCC)   Actinomycosis  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 failure in the setting of sepsis, health care associated pneumonia, recurrent mucus plugging:  Improving. Failed extubation.  S/p trach , continue with trach collar on 28% oxygen to keep sats greater than 90%.  SLP PMV on board.  - PT eval recommending SNF.     Left buttock necrotizing fascitis with E coli , Group B strep, peptostreptococcus in wound culture Hydrotherapy 06/29/20 till 07/04/20 No further debridement.  On augmentin for 6 months as per PCCM.    Hypernatremia from diuresis and insensible losses:  Resolved.   Diabetes mellitus:  CBG (last 3)  Recent Labs    07/11/20 0627 07/11/20 0800 07/11/20 1143  GLUCAP 207* 149* 128*   Uncontrolled with hyperglycemia. Stop the cortrak feeds as he is able to take soft diet.  SLP eval  recommending regular diet with thin liquid.  Currently on 42 units BID, along with Novolog 16 units TIDAC, AND SSI.    Acute blood loss anemia; from blood loss from wound.  S/p PRBC transfusion. H&H stable around 8.    Hypothyroidism:  Continue with synthroid.    Atrial fibrillation:  Paroxysmal.  Rate controlled with amiodarone 200 mg daily.   Therapeutic iNR.  pharmacy to dose coumadin.   Pressure injury present on admission:  Pressure Injury 06/10/20 Heel Right Stage 2 -  Partial thickness loss of dermis presenting as a shallow open injury with a red, pink wound bed without slough. (Active)  06/10/20 1400  Location: Heel  Location Orientation: Right  Staging: Stage 2 -  Partial thickness loss of dermis presenting as a shallow open injury with a red, pink wound bed without slough.  Wound Description (Comments):   Present on Admission: Yes   Wound care consulted .    Hyperlipidemia:  Resume lipitor.      DVT prophylaxis: (Warfarin) Code Status: (Full Code) Family Communication: None at bedside.  Disposition:   Status is: Inpatient  Remains inpatient appropriate because:Ongoing diagnostic testing needed not appropriate for outpatient work up, Unsafe d/c plan and Inpatient level of care appropriate due to severity of illness   Dispo: The patient is from: Home              Anticipated d/c is to: SNF  Anticipated d/c date is: > 3 days              Patient currently is not medically stable to d/c.   Difficult to place patient Yes  Level of care: Progressive   Consultants:   General surgery.   ID  PCCM.    Procedures:  ETT 1/10 >>  R IJ CVL 1/10 >>  L brachial A-line 1/10 >>    Antimicrobials: Aztreonam 1/09 Metronidazole 1/09 Vancomycin 1/09, 1/11, 1/20 >>1/22 Cefepime 1/10 >> 1/13, 1/20 >>1/22 Clindamycin 1/10 >> 1/13 Ceftriaxone 1/13 >> 1/20 Metronidazole 1/17 >> 1/22 Meropenem 1/22 >> 1/25 Amoxicillin 1/24 (x 1 for PCN Allergy  challenge) Unasyn 1/25 >> 1/27 Augmentin 1/27 >>  Significant Hospital Events:  1/10  Admitted to [facility]. General surgery consulted and debridement pursued. In the afternoon, patient developed hypotension with AF in RVR. Synchronized cardioversion unsuccessful with conversion to VT. CPR initated with ROSC after desynchronized defibrillation. 1/11 Debridement 1/18 Mucus plugging 1/19 Bronchoscopy >> mucus plug on Rt, airway collapse with exhalation 1/20 Persistent fever, change ABx 1/24 Increased vent requirements, habitus related +/- mucus plugging 1/25 Hypotensive to SBP 80s, Levo resumed, minimally responsive overnight off sedation, CT Head negative 1/26 Stable vent/pressor requirements, bronched, tracheostomy 1/29 start hydrotherapy 1/30 start lovenox/coumadin 2/2 Tolerating PS 2/4 did TC for 12 hours 2/5 Did 24 hours TC 2/6 Continue TC  2/7 Try dilaudid with turns/dressing change for better pain control - longer acting, fentanyl IV d/c'd 2/8 Increase standing oxy, dilaudid PRN for turns/dressing changes, downgrade to progressive    Subjective: Reports not having slept any last night.  No chest pain or sob.   Objective: Vitals:   07/11/20 0458 07/11/20 0800 07/11/20 0815 07/11/20 1200  BP:  112/60    Pulse: (!) 109 (!) 108 (!) 110 (!) 105  Resp: 16 13 15 16   Temp:  98.8 F (37.1 C)    TempSrc:  Oral    SpO2: 95% 92% 97% 98%  Weight:      Height:        Intake/Output Summary (Last 24 hours) at 07/11/2020 1332 Last data filed at 07/11/2020 1317 Gross per 24 hour  Intake 1560 ml  Output 5200 ml  Net -3640 ml   Filed Weights   07/08/20 0500 07/10/20 0304 07/11/20 0139  Weight: (!) 239.7 kg (!) 242.6 kg (!) 242.8 kg    Examination:  General exam: Morbidly obese gentleman s/p trach  And on 5 lit of oxygen.  Respiratory system: air entry fair, no wheezing heard. S/p trach on 28% Fio2.  Cardiovascular system: S1S2, RRR no JVD, pedal edema present.  Gastrointestinal  system: Abdomen is soft,NT ND BS+ Central nervous system: ALERT and oriented, non focal.  Extremities: pedal edema present.  Skin: pressure injury on the heel.  Psychiatry: Mood is appropriate.     Data Reviewed: I have personally reviewed following labs and imaging studies  CBC: Recent Labs  Lab 07/07/20 1027 07/08/20 0105 07/09/20 0125 07/10/20 0333 07/11/20 0246  WBC 9.0 9.2 10.4 9.7 10.2  HGB 8.5* 8.6* 8.6* 8.8* 8.7*  HCT 30.1* 29.0* 28.8* 29.9* 27.8*  MCV 97.7 95.7 94.1 93.7 90.3  PLT 283 301 284 282 263    Basic Metabolic Panel: Recent Labs  Lab 07/05/20 0303 07/06/20 0139 07/07/20 1027 07/08/20 0105 07/09/20 0125 07/10/20 0333 07/11/20 0246  NA 146* 144 142 144 142 137 133*  K 3.5 4.1 4.2 4.2 4.3 4.1 4.2  CL 105 105 104 106 105  99 96*  CO2 30 28 27 26 26 27 26   GLUCOSE 215* 203* 234* 219* 186* 183* 199*  BUN 41* 40* 36* 37* 38* 31* 25*  CREATININE 1.14 1.10 1.04 0.93 0.97 1.04 0.99  CALCIUM 8.8* 8.6* 8.4* 8.7* 8.7* 8.6* 8.3*  MG 2.1 2.1  --   --   --   --   --     GFR: Estimated Creatinine Clearance: 181.9 mL/min (by C-G formula based on SCr of 0.99 mg/dL).  Liver Function Tests: No results for input(s): AST, ALT, ALKPHOS, BILITOT, PROT, ALBUMIN in the last 168 hours.  CBG: Recent Labs  Lab 07/10/20 1737 07/10/20 2053 07/11/20 0627 07/11/20 0800 07/11/20 1143  GLUCAP 240* 225* 207* 149* 128*     No results found for this or any previous visit (from the past 240 hour(s)).       Radiology Studies: No results found.      Scheduled Meds: . amiodarone  200 mg Per Tube Daily  . amoxicillin-clavulanate  1 tablet Per Tube Q8H  . atorvastatin  20 mg Per Tube QPM  . chlorhexidine  15 mL Mouth Rinse BID  . Chlorhexidine Gluconate Cloth  6 each Topical Daily  . collagenase  1 application Topical Daily  . feeding supplement (PROSource TF)  90 mL Per Tube TID  . insulin aspart  0-20 Units Subcutaneous TID WC  . insulin aspart  0-5 Units  Subcutaneous QHS  . insulin aspart  16 Units Subcutaneous TID WC  . insulin detemir  42 Units Subcutaneous BID  . levothyroxine  75 mcg Per Tube Daily  . mouth rinse  15 mL Mouth Rinse q12n4p  . nortriptyline  25 mg Per Tube BID  . oxyCODONE  15 mg Oral Q6H  . pantoprazole sodium  40 mg Per Tube Q24H  . pregabalin  300 mg Per Tube BID  . sodium chloride flush  10-40 mL Intracatheter Q12H  . sodium chloride flush  3 mL Intravenous Q12H  . Warfarin - Pharmacist Dosing Inpatient   Does not apply q1600   Continuous Infusions: . sodium chloride Stopped (06/28/20 2035)  . feeding supplement (JEVITY 1.5 CAL/FIBER)       LOS: 31 days        2036, MD [facility]   To contact the attending provider between 7A-7P or the covering provider during after hours 7P-7A, please log into the web site [email or web address] and access using universal Sims password for that web site. If you do not have the password, please call the hospital operator.  07/11/2020, 1:32 PM

## 2020-07-11 NOTE — Progress Notes (Signed)
Occupational Therapy Treatment Patient Details Name: Carl Hill MRN: [id number] DOB: [birth year] Today's Date: 07/11/2020    History of present illness Pt is a 50 year old male who presented with buttock pain, fatigue and malaise.  Found to have hyperglycemia. CT pelvis demonstrated necrotizing fasciitis in L buttock, L upper thigh, and perineum. Taken to OR for debridement 1/10 c/b Afib with RVR and VT arrest s/p CPR/defib. Trached 1/26, remains on full vent support. PMHx: HTN, obesity, DM, AFib.  As of 07/04/20 started TC trials.   OT comments  Pt seen in conjunction with [facility] bed rep to verticalize pt. Pt initially asking for therapist to return this pm so he could sleep. Discussed schedule that he agreed to, which was set at 8:30 am, and need to participate at scheduled time as bed rep travels from [place]. Pt asking why he needed to stand and why couldn't he just sit in the large bariatric chair. Educated pt on progression of mobility and relieving pressure from his bottom/wounds with use of air mattress and bed in chair position rather than sitting in regular chair at this time. Pt eventually verbalized understanding. Able to achieve 40 degrees for less than 1 in due to decrease in BP. Supine 110/74; 33 degrees for 5 min 99/74; 40 degrees less than 1 min 64/33 (do not feel this was reliable- pt states he passed out however was talking and responding to questions); 20 degrees 87/56; supine 98/64. Requires mod VC to slow RR and encourage relaxation to help with slowing RR; Max HR 121; SpO2 above 90 on 5L; FiO2 28: with PMSV in place. Pt assisting with rolling in bed and moaning with all and any movement. Began education on BUE with theraband - looped on each side of bed. Feel anxiety and poor sleep quality affecting participation today - MD notified. Will continue to follow acutely. Rehab at SNF remains appropriate at this time.   Follow Up Recommendations  SNF;Supervision/Assistance - 24 hour     Equipment Recommendations  Wheelchair (measurements OT);Wheelchair cushion (measurements OT);Hospital bed;3 in 1 bedside commode    Recommendations for Other Services      Precautions / Restrictions Precautions Precautions: Fall;Other (comment) Precaution Comments: TC, rectal tube, large buttocks wound, hydro on hold; PMSV Restrictions Weight Bearing Restrictions: No       Mobility Bed Mobility                  Transfers                      Balance                                           ADL either performed or assessed with clinical judgement   ADL Overall ADL's : Needs assistance/impaired     Grooming: Set up;Sitting;Bed level                               Functional mobility during ADLs: Maximal assistance;+2 for physical assistance;+2 for safety/equipment       Vision       Perception     Praxis      Cognition Arousal/Alertness: Awake/alert Behavior During Therapy: Anxious (with mobility)  General Comments: most likely at baseline; tangential at times; likes details adn to know everything you are doing and why        Exercises Other Exercises Other Exercises: Initiated theraband ex for BUE - looped around bedreails with focus on upper back and tricep strengthening   Shoulder Instructions       General Comments verticalization used; able to achieve 40 degrees briefly; pt reports seeing double adn starts when laying on his side, then when laying flat then again when standing. Pt reports he was "going out", however responded to name when called. BP did drop, however feel anxiety is also playing a roll. Long discussion regarding role of rehab to achieve goals and importance of participating with therapists at scheduled time.    Pertinent Vitals/ Pain       Pain Assessment: 0-10 Pain Score: 10-Worst pain ever Pain Location: buttocks and back, bil legs,  shoulders, neck Pain Descriptors / Indicators: Aching;Contraction;Cramping;Grimacing;Guarding;Moaning;Jabbing Pain Intervention(s): Limited activity within patient's tolerance;Premedicated before session;Relaxation  Home Living Family/patient expects to be discharged to:: Private residence                                        Prior Functioning/Environment              Frequency  Min 2X/week        Progress Toward Goals  OT Goals(current goals can now be found in the care plan section)  Progress towards OT goals: Progressing toward goals  Acute Rehab OT Goals Patient Stated Goal: to be able to return home with his Mom and do what he did before OT Goal Formulation: With patient Time For Goal Achievement: 07/16/20 Potential to Achieve Goals: Good ADL Goals Pt Will Perform Grooming: with set-up;sitting Pt Will Perform Upper Body Dressing: with set-up;sitting Pt/caregiver will Perform Home Exercise Program: Increased strength;Both right and left upper extremity;With Supervision;With theraband Additional ADL Goal #1: Pt will increase to minA +2 for bed mobility to increase independence for OOB ADL. Additional ADL Goal #2: Pt will tolerate x5 mins of unsupported sitting for dynamic sitting balance tasks with O2 >90%. Additional ADL Goal #3: Pt will tolerate standing @ 60 degrees x 5 min in preparation for ADL tasks and mobility  Plan Frequency remains appropriate;Discharge plan remains appropriate    Co-evaluation                 AM-PAC OT "6 Clicks" Daily Activity     Outcome Measure   Help from another person eating meals?: A Little Help from another person taking care of personal grooming?: A Little Help from another person toileting, which includes using toliet, bedpan, or urinal?: Total Help from another person bathing (including washing, rinsing, drying)?: A Lot Help from another person to put on and taking off regular upper body clothing?: A  Lot Help from another person to put on and taking off regular lower body clothing?: Total 6 Click Score: 12    End of Session Equipment Utilized During Treatment: Oxygen (5L; 298% FiO2)  OT Visit Diagnosis: Unsteadiness on feet (R26.81);Muscle weakness (generalized) (M62.81);Pain Pain - part of body: Shoulder;Arm;Hip;Knee;Leg (Back; bottom)   Activity Tolerance Patient limited by pain;Patient limited by fatigue;Other (comment) (anxiety)   Patient Left in bed;with call bell/phone within reach (R sidelying)   Nurse Communication Mobility status;Patient requests pain meds        Time: 4403-4742 OT  Time Calculation (min): 64 min  Charges: OT General Charges $OT Visit: 1 Visit OT Treatments $Self Care/Home Management : 8-22 mins $Therapeutic Activity: 23-37 mins $Therapeutic Exercise: 8-22 mins  Luisa Dago, OT/L   Acute OT Clinical Specialist Acute Rehabilitation Services Pager [phone] Office [phone]    [facility] 07/11/2020, 11:44 AM

## 2020-07-11 NOTE — Progress Notes (Signed)
ANTICOAGULATION CONSULT NOTE  Pharmacy Consult for Warfarin  Indication: atrial fibrillation  Patient Measurements: Height: 5\' 10"  (177.8 cm) Weight: (!) 242.8 kg (535 lb 4.4 oz) IBW/kg (Calculated) : 73 Heparin Dosing Weight: 149.7 kg  Vital Signs: Temp: 98.8 F (37.1 C) (02/10 0800) Temp Source: Oral (02/10 0800) BP: 112/60 (02/10 0800) Pulse Rate: 108 (02/10 0800)  Labs: Recent Labs    07/09/20 0125 07/10/20 0333 07/11/20 0246  HGB 8.6* 8.8* 8.7*  HCT 28.8* 29.9* 27.8*  PLT 284 282 263  LABPROT 28.8* 27.3* 33.3*  INR 2.8* 2.6* 3.4*  CREATININE 0.97 1.04 0.99    Estimated Creatinine Clearance: 181.9 mL/min (by C-G formula based on SCr of 0.99 mg/dL).   Assessment: 49YOM admitted with septic shock secondary to necrotizing fascitis, on IV heparin for Afib. Was on xarelto PTA, but that is not appropriate based on patient's weight. Previously bridging warfarin with lovenox until INR therapeutic. LMWH level was elevated at 1.25 on 1/31 - dose was decreased to 225mg  q12hr. Patient with some sacral wound bleeding on 2/1 PM and received topical TXA/thrombi pads.  Lovenox discontinue on 07/03/20 since INR was>2.     Warfarin started on 06/30/20.  Held warfarin 07/05/20 thru 07/08/20 due to supratherapeutic INR > resumed Warfarin on 07/09/20 once INR was back within 2-3 goal. Today 2/10 the INR increased to 3.4, supratherapeutic after 2 days of 10mg /day..   Hgb stable at 8.7 and pltc is stable within normal range.  No bleeding noted. Potential drug-drug interaction with amiodarone and augmentin, may increase hypoprothrombinemic effect of warfarin.   Cortrak removed 2/9 PM, RN note pt able to swallow & ate whole meal w/o difficulty .   Goal of Therapy:  INR 2 - 3 Monitor platelets by anticoagulation protocol: Yes   Plan:  Hold Warfarin dose today Daily INR and CBC  09/06/20, RPh Clinical Pharmacist Phone between 7 am - 3:30 pm: Please check AMION for all [facility]  phone numbers After 10:00 PM, call Main Pharmacy [phone] 07/11/2020, 10:07 AM

## 2020-07-11 NOTE — Progress Notes (Signed)
Nutrition Follow-up  DOCUMENTATION CODES:   Morbid obesity  INTERVENTION:   -D/c Jevity 1.5 -D/c Prosource TF -Double protein portions with all meals -Magic cup TID with meals, each supplement provides 290 kcal and 9 grams of protein -Ensure Enlive po BID, each supplement provides 350 kcal and 20 grams of protein -Ensure Max po daily, each supplement provides 150 kcal and 30 grams of protein.  -MVI with minerals daily  NUTRITION DIAGNOSIS:   Increased nutrient needs related to wound healing as evidenced by estimated needs.  Ongoing  GOAL:   Patient will meet greater than or equal to 90% of their needs  Progressing   MONITOR:   PO intake,Supplement acceptance,Labs,Weight trends,Skin,I & O's  REASON FOR ASSESSMENT:   Consult Enteral/tube feeding initiation and management  ASSESSMENT:   50 yo male admitted with hyperglycemia, severe septic shock r/t Fournier's gangrene, AKI. PMH includes morbid obesity, HTN, DM, chronic LE wounds (L heel, L calf) A fib, laparoscopic gastric sleeve resection.  1/26- s/p tracheostomy 2/4- transitioned to trach collar 2/8- s/p FEES- advanced to full liquid diet 2/9- advanced to dysphagia 3 diet, cortrak removed  Reviewed I/O's: -4.6 L x 24 hours and -34.4 L since admission  Spoke with pt mom at bedside. She requested that this RD not awaken pt, as he had a rough night and her just got to sleep. She reports pt has a great appetite and is thrilled to have his cortrak tube removed. She reports he ate all of his breakfast this morning. Noted meal completions 100%.  Discussed importance of good meal and supplement intake for healing. Per mom, pt is amenable to try supplements, as long as they are chocolate flavored. He had an Ensure yesterday, which he enjoyed.   Labs reviewed: Na: 133, CBGS: 128-225 (inpatient orders for glycemic control are 0-20 units insulin aspart TID with meals, 0-5 units insulin aspart daly at bedtime, 16 units insulin  aspart TID with meals, and 42 units insulin glargine BlD).   Diet Order:   Diet Order            DIET DYS 3 Room service appropriate? Yes; Fluid consistency: Thin  Diet effective now                 EDUCATION NEEDS:   Education needs have been addressed  Skin:  Skin Assessment: Skin Integrity Issues: Skin Integrity Issues:: Stage II,Other (Comment) Stage II: R heel Other: necrotizing infection to pelvis, anus, rectum  Last BM:  07/11/20 (via rectal tube)  Height:   Ht Readings from Last 1 Encounters:  06/14/20 5\' 10"  (1.778 m)    Weight:   Wt Readings from Last 1 Encounters:  07/11/20 (!) 242.8 kg    Ideal Body Weight:  75.5 kg  BMI:  Body mass index is 76.8 kg/m.  Estimated Nutritional Needs:   Kcal:  09/08/20  Protein:  150-190 grams  Fluid:  > 2 L    [id number], RD, LDN, CDCES Registered Dietitian II Certified Diabetes Care and Education Specialist Please refer to [facility] for RD and/or RD on-call/weekend/after hours pager

## 2020-07-11 NOTE — Progress Notes (Signed)
Pt. Refusing to have wound to leg and buttocks redressed  at this time. Pt. States the dressing only needs to be done once daily. RN noted that the dressing to pts.buttocks is Soiled with serosanguineous drainage. Pt. Made aware of drainage to dressing and need for wound care to be done. Pt. also refusing to have dressing reinforced. Pt did allow to be turned to opposite side

## 2020-07-11 NOTE — Plan of Care (Signed)
  Problem: Nutrition: Goal: Adequate nutrition will be maintained Outcome: Progressing   

## 2020-07-12 DIAGNOSIS — J9602 Acute respiratory failure with hypercapnia: Secondary | ICD-10-CM | POA: Diagnosis not present

## 2020-07-12 DIAGNOSIS — J9601 Acute respiratory failure with hypoxia: Secondary | ICD-10-CM | POA: Diagnosis not present

## 2020-07-12 DIAGNOSIS — Z930 Tracheostomy status: Secondary | ICD-10-CM | POA: Diagnosis not present

## 2020-07-12 LAB — PROTIME-INR
INR: 4.2 (ref 0.8–1.2)
Prothrombin Time: 39.4 seconds — ABNORMAL HIGH (ref 11.4–15.2)

## 2020-07-12 LAB — GLUCOSE, CAPILLARY
Glucose-Capillary: 185 mg/dL — ABNORMAL HIGH (ref 70–99)
Glucose-Capillary: 273 mg/dL — ABNORMAL HIGH (ref 70–99)
Glucose-Capillary: 183 mg/dL — ABNORMAL HIGH (ref 70–99)
Glucose-Capillary: 221 mg/dL — ABNORMAL HIGH (ref 70–99)

## 2020-07-12 MED ORDER — POLYETHYLENE GLYCOL 3350 17 G PO PACK
17.0000 g | PACK | Freq: Every day | ORAL | Status: DC | PRN
  Administered 2020-07-22 – 2020-07-29 (×7): 17 g via ORAL
  Filled 2020-07-12 (×7): qty 1

## 2020-07-12 MED ORDER — AMOXICILLIN-POT CLAVULANATE 875-125 MG PO TABS
1.0000 | ORAL_TABLET | Freq: Three times a day (TID) | ORAL | Status: DC
  Administered 2020-07-12 – 2020-08-05 (×73): 1 via ORAL
  Filled 2020-07-12 (×73): qty 1

## 2020-07-12 MED ORDER — MELATONIN 3 MG PO TABS
3.0000 mg | ORAL_TABLET | Freq: Every evening | ORAL | Status: DC | PRN
  Administered 2020-07-14 – 2020-07-31 (×4): 3 mg via ORAL
  Filled 2020-07-12 (×7): qty 1

## 2020-07-12 MED ORDER — ACETAMINOPHEN 325 MG PO TABS
650.0000 mg | ORAL_TABLET | Freq: Four times a day (QID) | ORAL | Status: DC | PRN
  Filled 2020-07-12: qty 2

## 2020-07-12 MED ORDER — PREGABALIN 100 MG PO CAPS
300.0000 mg | ORAL_CAPSULE | Freq: Two times a day (BID) | ORAL | Status: DC
  Administered 2020-07-12 – 2020-08-05 (×49): 300 mg via ORAL
  Filled 2020-07-12 (×51): qty 3

## 2020-07-12 MED ORDER — ACETAMINOPHEN 160 MG/5ML PO SOLN
650.0000 mg | Freq: Four times a day (QID) | ORAL | Status: DC | PRN
  Filled 2020-07-12: qty 20.3

## 2020-07-12 MED ORDER — PANTOPRAZOLE SODIUM 40 MG PO TBEC
40.0000 mg | DELAYED_RELEASE_TABLET | Freq: Every day | ORAL | Status: DC
  Administered 2020-07-12 – 2020-08-05 (×25): 40 mg via ORAL
  Filled 2020-07-12 (×26): qty 1

## 2020-07-12 MED ORDER — AMIODARONE HCL 200 MG PO TABS
200.0000 mg | ORAL_TABLET | Freq: Every day | ORAL | Status: DC
  Administered 2020-07-13 – 2020-08-05 (×24): 200 mg via ORAL
  Filled 2020-07-12 (×25): qty 1

## 2020-07-12 MED ORDER — NORTRIPTYLINE HCL 25 MG PO CAPS
25.0000 mg | ORAL_CAPSULE | Freq: Two times a day (BID) | ORAL | Status: DC
  Administered 2020-07-12 – 2020-07-25 (×27): 25 mg via ORAL
  Filled 2020-07-12 (×29): qty 1

## 2020-07-12 MED ORDER — LEVOTHYROXINE SODIUM 75 MCG PO TABS
75.0000 ug | ORAL_TABLET | Freq: Every day | ORAL | Status: DC
  Administered 2020-07-13 – 2020-08-05 (×24): 75 ug via ORAL
  Filled 2020-07-12 (×24): qty 1

## 2020-07-12 MED ORDER — SENNOSIDES-DOCUSATE SODIUM 8.6-50 MG PO TABS
2.0000 | ORAL_TABLET | Freq: Two times a day (BID) | ORAL | Status: DC
  Administered 2020-07-12 – 2020-07-24 (×20): 2 via ORAL
  Filled 2020-07-12 (×22): qty 2

## 2020-07-12 MED ORDER — ATORVASTATIN CALCIUM 10 MG PO TABS
20.0000 mg | ORAL_TABLET | Freq: Every evening | ORAL | Status: DC
  Administered 2020-07-12 – 2020-08-05 (×25): 20 mg via ORAL
  Filled 2020-07-12 (×25): qty 2

## 2020-07-12 NOTE — Progress Notes (Signed)
ANTICOAGULATION CONSULT NOTE  Pharmacy Consult for Warfarin  Indication: atrial fibrillation  Patient Measurements: Height: 5\' 10"  (177.8 cm) Weight: (!) 246.9 kg (544 lb 5.1 oz) IBW/kg (Calculated) : 73 Heparin Dosing Weight: 149.7 kg  Vital Signs: Temp: 98.5 F (36.9 C) (02/11 0717) Temp Source: Oral (02/11 0717) BP: 125/69 (02/11 0717) Pulse Rate: 95 (02/11 0717)  Labs: Recent Labs    07/10/20 0333 07/11/20 0246 07/12/20 0151  HGB 8.8* 8.7*  --   HCT 29.9* 27.8*  --   PLT 282 263  --   LABPROT 27.3* 33.3* 39.4*  INR 2.6* 3.4* 4.2*  CREATININE 1.04 0.99  --     Estimated Creatinine Clearance: 184.1 mL/min (by C-G formula based on SCr of 0.99 mg/dL).   Assessment: 49YOM admitted with septic shock secondary to necrotizing fascitis, on IV heparin for Afib. Was on xarelto PTA, but that is not appropriate based on patient's weight. Previously bridging warfarin with lovenox until INR therapeutic. LMWH level was elevated at 1.25 on 1/31 - dose was decreased to 225mg  q12hr. Patient with some sacral wound bleeding on 2/1 PM and received topical TXA/thrombi pads.  Lovenox discontinue on 07/03/20 since INR was>2.     Warfarin started on 06/30/20.  Held warfarin 07/05/20 thru 07/08/20 due to supratherapeutic INR > resumed Warfarin on 07/09/20 once INR was back within 2-3 goal. Today 2/11 the INR increased to 4.2, supratherapeutic after 2 days of 10mg /day, and dose held 2/10.  Hgb stable at 8.7 and pltc is stable within normal range.  No bleeding noted. Potential drug-drug interaction with amiodarone and augmentin, can increase hypoprothrombinemic effect of warfarin.   Cortrack removed 07/10/20. Patient able to swallow & ate whole meal w/o difficulty. Taking medication via oral route.  Goal of Therapy:  INR 2 - 3 Monitor platelets by anticoagulation protocol: Yes   Plan:  Hold Warfarin dose today Daily INR and CBC  , RPh Clinical Pharmacist Phone between 7 am - 3:30 pm:  4/10 Please check AMION for all [facility] phone numbers After 10:00 PM, call Main Pharmacy [phone] 07/12/2020, 9:46 AM

## 2020-07-12 NOTE — Plan of Care (Signed)
  Problem: Nutrition: Goal: Adequate nutrition will be maintained Outcome: Progressing   

## 2020-07-12 NOTE — Progress Notes (Signed)
  Speech Language Pathology Treatment: Dysphagia;Passy Muir Speaking valve  Patient Details Name: Carl Hill MRN: [id number] DOB: 21-Jul-1970 Today's Date: 07/12/2020 Time: [id number] SLP Time Calculation (min) (ACUTE ONLY): 25 min  Assessment / Plan / Recommendation Clinical Impression  Pt tolerating solids with assisted feeding while side lying. He is following bites with sips as needed. Intermittent expectoration of secretions independent of PO intake. Pt and mother instructed in placement and removal of PMSV and necessary precautions going forward. Mother demonstrates ability to place valve as Shanon Brow cant do it. No SLP f/u needed as pt and family are independent.   HPI        SLP Plan  All goals met       Recommendations  Liquids provided via: Cup;Straw Medication Administration: Via alternative means Supervision: Full supervision/cueing for compensatory strategies      Patient may use Passy-Muir Speech Valve: During all waking hours (remove during sleep);During PO intake/meals;During all therapies with supervision PMSV Supervision: Intermittent MD: Please consider changing trach tube to : Cuffless         Follow up Recommendations: None Plan: All goals met       GO               Herbie [place], MA [place] Pager [phone] Office [phone]  Lynann Beaver 07/12/2020, 12:52 PM

## 2020-07-12 NOTE — Progress Notes (Signed)
Physical Therapy Treatment Patient Details Name: Carl Hill MRN: [id number] DOB: 01-Sep-1970 Today's Date: 07/12/2020    History of Present Illness Pt is a 50 year old male who presented with buttock pain, fatigue and malaise.  Found to have hyperglycemia. CT pelvis demonstrated necrotizing fasciitis in L buttock, L upper thigh, and perineum. Taken to OR for debridement 1/10 c/b Afib with RVR and VT arrest s/p CPR/defib. Trached 1/26, remains on full vent support. PMHx: HTN, obesity, DM, AFib.  As of 07/04/20 started TC trials.    PT Comments    Pt was able to tilt the highest he has tilted before with VSS on 98% FiO2 10L TC.  He continues to get a bit anxious at times, but did well as I consistently distracted him with t-band and LE exercises with each transition.  He was in some form of tilt for ~20 mins despite only being at 47 degrees for 5 mins.  He is doing much better and initiating trunk and head (we did semi- abdominal crunches in max tilt where he lifted head and shoulders to reach for my hand bil).  I think he is ready to start attempting sitting EOB.  I will work with Vernie Murders bed rep to trying max chair position next time to assess tolerance of sitting on Kreg bed to see if his bottom wound would tolerate sitting EOB.  Next PT session should be Monday  07/15/20 at 8:30 am. PT will continue to follow acutely for safe mobility progression.  Follow Up Recommendations  SNF     Equipment Recommendations  Hospital bed;Other (comment) (hoyer lift, air mattress, bariatric equipment.)    Recommendations for Other Services       Precautions / Restrictions Precautions Precautions: Fall;Other (comment) Precaution Comments: TC, rectal tube, large buttocks wound, hydro on hold; PMSV Restrictions Weight Bearing Restrictions: No    Mobility  Bed Mobility Overal bed mobility: Needs Assistance Bed Mobility: Rolling Rolling: Mod assist;+2 for physical assistance         General bed  mobility comments: Two person mod assist, mostly to help with getting leg over to side lying.  Two person max assist to position fully Start Time: 0845 Angle: 47 degrees Total Minutes in Angle: 5 minutes (>20 mins tilting to get up to 47 degrees) Patient Response: Anxious  Transfers                    Ambulation/Gait                 Stairs             Wheelchair Mobility    Modified Rankin (Stroke Patients Only)       Balance                                            Cognition Arousal/Alertness: Awake/alert Behavior During Therapy: Anxious Overall Cognitive Status: Within Functional Limits for tasks assessed                                        Exercises General Exercises - Upper Extremity Shoulder Horizontal ABduction: AROM;Both;10 reps;Theraband Theraband Level (Shoulder Horizontal Abduction): Level 2 (Red) Elbow Flexion: AROM;Both;10 reps;Theraband Theraband Level (Elbow Flexion): Level 2 (Red) General Exercises - Lower Extremity Ankle  Circles/Pumps: AROM;Both;10 reps;Standing (on tilt table) Quad Sets: AROM;Both;10 reps;Standing;Other (comment) (on tilt) Straight Leg Raises: AAROM;Both;10 reps;Supine    General Comments        Pertinent Vitals/Pain Pain Assessment: 0-10 Pain Score: 10-Worst pain ever Pain Location: buttocks primary today Pain Descriptors / Indicators: Grimacing;Guarding Pain Intervention(s): Limited activity within patient's tolerance;Monitored during session;Repositioned    Home Living                      Prior Function            PT Goals (current goals can now be found in the care plan section) Acute Rehab PT Goals Patient Stated Goal: to be able to return home with his Mom and do what he did before Progress towards PT goals: Progressing toward goals    Frequency    Min 3X/week (splitting with OT to be able to tilt)      PT Plan Current plan remains  appropriate    Co-evaluation              AM-PAC PT "6 Clicks" Mobility   Outcome Measure  Help needed turning from your back to your side while in a flat bed without using bedrails?: A Lot Help needed moving from lying on your back to sitting on the side of a flat bed without using bedrails?: Total Help needed moving to and from a bed to a chair (including a wheelchair)?: Total Help needed standing up from a chair using your arms (e.g., wheelchair or bedside chair)?: Total Help needed to walk in hospital room?: Total Help needed climbing 3-5 steps with a railing? : Total 6 Click Score: 7    End of Session Equipment Utilized During Treatment: Oxygen (90% FiO2 TC 10 L) Activity Tolerance: Patient limited by pain Patient left: in bed;with call bell/phone within reach;Other (comment) (with SLP assisting in eating at end of session)   PT Visit Diagnosis: Muscle weakness (generalized) (M62.81);Other abnormalities of gait and mobility (R26.89)     Time: [phone] (only billed for 4 units as I was helping him eat the rest of the time) PT Time Calculation (min) (ACUTE ONLY): 88 min  Charges:  $Therapeutic Exercise: 8-22 mins $Therapeutic Activity: 38-52 mins                     Corinna Capra, PT, DPT  Acute Rehabilitation [phone] pager [phone]) [phone] office

## 2020-07-12 NOTE — Progress Notes (Signed)
Pt. Refuses dressing to be changed on night shift. States it is a dayshift only.

## 2020-07-12 NOTE — Progress Notes (Signed)
PROGRESS NOTE    Carl Hill  JGG:[id number] DOB: 12/20/1970 DOA: 06/10/2020 PCP: Malka So., MD   Chief Complaint  Patient presents with  . Hyperglycemia    Brief Narrative:  50 year old male with piror h/o HTN, obesity, DM, AFib on DOAC presents with buttock pain, malaise. CT pelvis showed demonstrated necrotizing fasciitis in L buttock, L upper thigh, and perineum. Taken to OR for debridement 1/10 c/b Afib with RVR and VT arrest s/p CPR/defib. Trached 1/26,.  Currently on trach collar. He was transferred to progressive care to [facility] on 07/10/20.  Toc on board for placement.  PCCM to  change trach to cuffless when ready.  Pt seen and examined today, reports having blurry vision with narcotic pain meds and lyrica.  He does not want [place] to change the pain medications.    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  S/p trach , continue with trach collar on 28% oxygen to keep sats greater than 90%. pccm to consider changing trach collar to cuffless when ready.  SLP PMV on board.  - PT eval recommending SNF.     Left buttock necrotizing fascitis with E coli , Group B strep, peptostreptococcus in wound culture Hydrotherapy 06/29/20 till 07/04/20 No further debridement.  On augmentin for 6 months as per PCCM.  Family is requesting for repeat wound care consult.     Hypernatremia from diuresis and insensible losses:  Resolved.   Diabetes mellitus:  CBG (last 3)  Recent Labs    07/11/20 1656 07/11/20 2051 07/12/20 0617  GLUCAP 112* 224* 185*   Uncontrolled with hyperglycemia. Stop the cortrak feeds as he is able to take soft diet.  SLP eval recommending regular diet with thin liquid.  Currently on 42 units BID, along with Novolog 16 units TIDAC, AND SSI.  No changes in meds.   Acute blood loss anemia; from blood loss from wound.  S/p PRBC transfusion. H&H stable around 8.    Hypothyroidism:  Continue with synthroid.    Atrial fibrillation:  Paroxysmal.  Rate controlled with amiodarone 200 mg daily.   Therapeutic iNR.  pharmacy to dose coumadin.   Pressure injury present on admission:  Pressure Injury 06/10/20 Heel Right Stage 2 -  Partial thickness loss of dermis presenting as a shallow open injury with a red, pink wound bed without slough. (Active)  06/10/20 1400  Location: Heel  Location Orientation: Right  Staging: Stage 2 -  Partial thickness loss of dermis presenting as a shallow open injury with a red, pink wound bed without slough.  Wound Description (Comments):   Present on Admission: Yes   Wound care consulted .    Hyperlipidemia:  Resume lipitor.      DVT prophylaxis: (Warfarin) Code Status: (Full Code) Family Communication: Mother at bedside.  Disposition:   Status is: Inpatient  Remains inpatient appropriate  because:Unsafe d/c plan and Inpatient level of care appropriate due to severity of illness   Dispo: The patient is from: Home              Anticipated d/c is to: SNF              Anticipated d/c date is: > 3 days              Patient currently is not medically stable to d/c.   Difficult to place patient Yes  Level of care: Progressive   Consultants:   General surgery.   ID  PCCM.    Procedures:  ETT 1/10 >>  R IJ CVL 1/10 >>  L brachial A-line 1/10 >>     Antimicrobials: Aztreonam 1/09 Metronidazole 1/09 Vancomycin 1/09, 1/11, 1/20 >>1/22 Cefepime 1/10 >> 1/13, 1/20 >>1/22 Clindamycin 1/10 >> 1/13 Ceftriaxone 1/13 >> 1/20 Metronidazole 1/17 >> 1/22 Meropenem 1/22 >> 1/25 Amoxicillin 1/24 (x 1 for PCN Allergy challenge) Unasyn 1/25 >> 1/27 Augmentin 1/27 >>  Significant Hospital Events:  1/10  Admitted to [facility]. General surgery consulted and debridement pursued. In the afternoon, patient developed hypotension with AF in RVR. Synchronized cardioversion unsuccessful with conversion to VT. CPR initated with ROSC after desynchronized defibrillation. 1/11 Debridement 1/18 Mucus plugging 1/19 Bronchoscopy >> mucus plug on Rt, airway collapse with exhalation 1/20 Persistent fever, change ABx 1/24 Increased vent requirements, habitus related +/- mucus plugging 1/25 Hypotensive to SBP 80s, Levo resumed, minimally responsive overnight off sedation, CT Head negative 1/26 Stable vent/pressor requirements, bronched, tracheostomy 1/29 start hydrotherapy 1/30 start lovenox/coumadin 2/2 Tolerating PS 2/4 did TC for 12 hours 2/5 Did 24 hours TC 2/6 Continue TC  2/7 Try dilaudid with turns/dressing change for better pain control - longer acting, fentanyl IV d/c'd 2/8 Increase standing oxy, dilaudid PRN for turns/dressing changes, downgrade to progressive    Subjective: Slight blurry vision with oxycodone and lyrica.  Pt alert and denies any chest pain or sob.   Objective: Vitals:   07/12/20 0538 07/12/20 0539 07/12/20 0717 07/12/20 0800  BP:  (!) 101/53 125/69   Pulse:  98 95   Resp:  14 16 (!) 105  Temp:  99 F (37.2 C) 98.5 F (36.9 C)   TempSrc:  Oral Oral   SpO2:  100% 100% 100%  Weight: (!) 246.9 kg     Height:        Intake/Output Summary (Last 24 hours) at 07/12/2020 1125 Last data filed at 07/12/2020 1049 Gross per 24 hour  Intake 1203 ml  Output 5400 ml  Net -4197 ml   Filed Weights   07/10/20 0304 07/11/20 0139  07/12/20 0538  Weight: (!) 242.6 kg (!) 242.8 kg (!) 246.9 kg    Examination:  General exam: Morbidly obese patient s/p trach on trach collar on 5 L of oxygen. Respiratory system: Air entry fair bilateral no wheezing s/p trach on 5 L of oxygen on trach collar Cardiovascular system: S1-S2 heard, regular rate rhythm, no JVD pedal edema present.  Gastrointestinal system: Abdomen is soft, nontender, nondistended bowel sounds normal Central nervous system: Alert and oriented, grossly nonfocal Extremities: 2+ leg edema present Skin: pressure injury on the heel.  Psychiatry: Mood is appropriate    Data Reviewed: I have personally reviewed following labs and imaging studies  CBC: Recent Labs  Lab 07/07/20 1027 07/08/20 0105 07/09/20 0125 07/10/20 0333 07/11/20 0246  WBC 9.0 9.2 10.4 9.7 10.2  HGB 8.5* 8.6* 8.6* 8.8* 8.7*  HCT  30.1* 29.0* 28.8* 29.9* 27.8*  MCV 97.7 95.7 94.1 93.7 90.3  PLT 283 301 284 282 263    Basic Metabolic Panel: Recent Labs  Lab 07/06/20 0139 07/07/20 1027 07/08/20 0105 07/09/20 0125 07/10/20 0333 07/11/20 0246  NA 144 142 144 142 137 133*  K 4.1 4.2 4.2 4.3 4.1 4.2  CL 105 104 106 105 99 96*  CO2 28 27 26 26 27 26   GLUCOSE 203* 234* 219* 186* 183* 199*  BUN 40* 36* 37* 38* 31* 25*  CREATININE 1.10 1.04 0.93 0.97 1.04 0.99  CALCIUM 8.6* 8.4* 8.7* 8.7* 8.6* 8.3*  MG 2.1  --   --   --   --   --     GFR: Estimated Creatinine Clearance: 184.1 mL/min (by C-G formula based on SCr of 0.99 mg/dL).  Liver Function Tests: No results for input(s): AST, ALT, ALKPHOS, BILITOT, PROT, ALBUMIN in the last 168 hours.  CBG: Recent Labs  Lab 07/11/20 0800 07/11/20 1143 07/11/20 1656 07/11/20 2051 07/12/20 0617  GLUCAP 149* 128* 112* 224* 185*     No results found for this or any previous visit (from the past 240 hour(s)).       Radiology Studies: No results found.      Scheduled Meds: . [START ON 07/13/2020] amiodarone  200 mg Oral  Daily  . amoxicillin-clavulanate  1 tablet Oral Q8H  . atorvastatin  20 mg Oral QPM  . chlorhexidine  15 mL Mouth Rinse BID  . Chlorhexidine Gluconate Cloth  6 each Topical Daily  . collagenase  1 application Topical Daily  . feeding supplement  237 mL Oral BID BM  . insulin aspart  0-20 Units Subcutaneous TID WC  . insulin aspart  0-5 Units Subcutaneous QHS  . insulin aspart  16 Units Subcutaneous TID WC  . insulin detemir  42 Units Subcutaneous BID  . [START ON 07/13/2020] levothyroxine  75 mcg Oral Daily  . mouth rinse  15 mL Mouth Rinse q12n4p  . multivitamin with minerals  1 tablet Oral Daily  . nortriptyline  25 mg Oral BID  . oxyCODONE  15 mg Oral Q6H  . pantoprazole  40 mg Oral Daily  . pregabalin  300 mg Oral BID  . Ensure Max Protein  11 oz Oral QHS  . senna-docusate  2 tablet Oral BID  . sodium chloride flush  10-40 mL Intracatheter Q12H  . sodium chloride flush  3 mL Intravenous Q12H  . Warfarin - Pharmacist Dosing Inpatient   Does not apply q1600   Continuous Infusions: . sodium chloride Stopped (06/28/20 2035)     LOS: 32 days        2036, MD [facility]   To contact the attending provider between 7A-7P or the covering provider during after hours 7P-7A, please log into the web site [email or web address] and access using universal [place] password for that web site. If you do not have the password, please call the hospital operator.  07/12/2020, 11:25 AM

## 2020-07-13 DIAGNOSIS — Z930 Tracheostomy status: Secondary | ICD-10-CM | POA: Diagnosis not present

## 2020-07-13 DIAGNOSIS — J9602 Acute respiratory failure with hypercapnia: Secondary | ICD-10-CM | POA: Diagnosis not present

## 2020-07-13 DIAGNOSIS — J9601 Acute respiratory failure with hypoxia: Secondary | ICD-10-CM | POA: Diagnosis not present

## 2020-07-13 LAB — BASIC METABOLIC PANEL
Anion gap: 10 (ref 5–15)
BUN: 10 mg/dL (ref 6–20)
CO2: 29 mmol/L (ref 22–32)
Calcium: 8.5 mg/dL — ABNORMAL LOW (ref 8.9–10.3)
Chloride: 94 mmol/L — ABNORMAL LOW (ref 98–111)
Creatinine, Ser: 1 mg/dL (ref 0.61–1.24)
GFR, Estimated: >60 mL/min (ref >60)
Glucose, Bld: 199 mg/dL — ABNORMAL HIGH (ref 70–99)
Potassium: 3.7 mmol/L (ref 3.5–5.1)
Sodium: 133 mmol/L — ABNORMAL LOW (ref 135–145)

## 2020-07-13 LAB — GLUCOSE, CAPILLARY
Glucose-Capillary: 168 mg/dL — ABNORMAL HIGH (ref 70–99)
Glucose-Capillary: 263 mg/dL — ABNORMAL HIGH (ref 70–99)
Glucose-Capillary: 247 mg/dL — ABNORMAL HIGH (ref 70–99)
Glucose-Capillary: 317 mg/dL — ABNORMAL HIGH (ref 70–99)

## 2020-07-13 LAB — CBC
HCT: 26.8 % — ABNORMAL LOW (ref 39.0–52.0)
Hemoglobin: 8.5 g/dL — ABNORMAL LOW (ref 13.0–17.0)
MCH: 28.1 pg (ref 26.0–34.0)
MCHC: 31.7 g/dL (ref 30.0–36.0)
MCV: 88.4 fL (ref 80.0–100.0)
Platelets: 229 10*3/uL (ref 150–400)
RBC: 3.03 MIL/uL — ABNORMAL LOW (ref 4.22–5.81)
RDW: 15.5 % (ref 11.5–15.5)
WBC: 8.1 10*3/uL (ref 4.0–10.5)
nRBC: 0.2 % (ref 0.0–0.2)

## 2020-07-13 LAB — PROTIME-INR
INR: 3.5 — ABNORMAL HIGH (ref 0.8–1.2)
Prothrombin Time: 34.2 seconds — ABNORMAL HIGH (ref 11.4–15.2)

## 2020-07-13 MED ORDER — INSULIN ASPART 100 UNIT/ML ~~LOC~~ SOLN
18.0000 [IU] | Freq: Three times a day (TID) | SUBCUTANEOUS | Status: DC
  Administered 2020-07-13 – 2020-08-03 (×58): 18 [IU] via SUBCUTANEOUS

## 2020-07-13 NOTE — Progress Notes (Signed)
ANTICOAGULATION CONSULT NOTE  Pharmacy Consult for Warfarin  Indication: atrial fibrillation  Patient Measurements: Height: 5\' 10"  (177.8 cm) Weight: (!) 246.9 kg (544 lb 5.1 oz) IBW/kg (Calculated) : 73 Heparin Dosing Weight: 149.7 kg  Vital Signs: Pulse Rate: 111 (02/12 0819)  Labs: Recent Labs    07/11/20 0246 07/12/20 0151 07/13/20 0421  HGB 8.7*  --  8.5*  HCT 27.8*  --  26.8*  PLT 263  --  229  LABPROT 33.3* 39.4* 34.2*  INR 3.4* 4.2* 3.5*  CREATININE 0.99  --  1.00    Estimated Creatinine Clearance: 182.2 mL/min (by C-G formula based on SCr of 1 mg/dL).   Assessment: 49YOM admitted with septic shock secondary to necrotizing fascitis. Was on xarelto PTA, but that is not appropriate based on patient's weight. Pharmacy has been consulted to dose warfarin.  INR still supratherapeutic at 3.5 but dropping appropriately after holding dose x1.   Hgb stable at 8.5 and pltc is stable within normal range. No bleeding noted. Potential drug-drug interaction with amiodarone and augmentin, can increase anticoagulant effect of warfarin. Patient s/p Cortrak now eating 100% of meals.  Goal of Therapy:  INR 2-3 Monitor platelets by anticoagulation protocol: Yes   Plan:  Hold warfarin today Daily INR and CBC  09/10/20, PharmD PGY1 Acute Care Pharmacy Resident 07/13/2020 8:21 AM  Please check [email or web address] for unit specific pharmacy phone numbers.

## 2020-07-13 NOTE — Progress Notes (Signed)
PROGRESS NOTE    Carl Hill  PNT:[id number] DOB: [birth year] DOA: 06/10/2020 PCP: Malka So., MD   Chief Complaint  Patient presents with  . Hyperglycemia    Brief Narrative:  50 year old male with piror h/o HTN, obesity, DM, AFib on DOAC presents with buttock pain, malaise. CT pelvis showed demonstrated necrotizing fasciitis in L buttock, L upper thigh, and perineum. Taken to OR for debridement 1/10 c/b Afib with RVR and VT arrest s/p CPR/defib. Trached 1/26,.  Currently on trach collar. He was transferred to progressive care to [facility] on 07/10/20.  Toc on board for placement.  PCCM to  change trach to cuffless when ready.  Pt seen and examined at bedside, no new complaints today. He continues to refuse PM dressing changes.     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  S/p trach , continue with trach collar on 28% oxygen to keep sats greater than 90%. pccm to consider changing trach collar to cuffless when ready.  SLP PMV on board.  - PT eval recommending SNF.  - pt remains on 5lit of  oxygen. No chest pain or sob.     Left buttock necrotizing fascitis with E coli , Group B strep, peptostreptococcus in wound culture Hydrotherapy 06/29/20 till 07/04/20 No further debridement.  On augmentin for 6 months as per PCCM.  Family is requesting for repeat  wound care consult.    Hypernatremia from diuresis and insensible losses:  Resolved.   Diabetes mellitus:  CBG (last 3)  Recent Labs    07/12/20 2132 07/13/20 0638 07/13/20 1134  GLUCAP 221* 168* 263*   Uncontrolled with hyperglycemia. Stopped the cortrak feeds as he is able to take soft diet.  SLP eval recommending regular diet with thin liquid.  Currently on 42 units BID, along with Novolog 16 units TIDAC, AND SSI.  Increase novolog to 18 units TIDAC.    Acute blood loss anemia; from blood loss from wound.  S/p PRBC transfusion. H&H stable around 8.  TRANSFUSE to keep hemoglobin greater than 7.    Hypothyroidism:  Continue with synthroid.    Atrial fibrillation:  Paroxysmal.  Rate controlled with amiodarone 200 mg daily.   Therapeutic iNR.  pharmacy to dose coumadin.   Pressure injury present on admission:  Pressure Injury 06/10/20 Heel Right Stage 2 -  Partial thickness loss of dermis presenting as a shallow open injury with a red, pink wound bed without slough. (Active)  06/10/20 1400  Location: Heel  Location Orientation: Right  Staging: Stage 2 -  Partial thickness loss of dermis presenting as a shallow open injury with a red, pink wound bed without slough.  Wound Description (Comments):   Present on Admission: Yes   Wound care reconsulted .    Hyperlipidemia:  Resume lipitor.      DVT prophylaxis: (Warfarin) Code Status: (Full  Code) Family Communication: none at bedside.   Disposition:   Status is: Inpatient  Remains inpatient appropriate because:Unsafe d/c plan and Inpatient level of care appropriate due to severity of illness   Dispo: The patient is from: Home              Anticipated d/c is to: SNF              Anticipated d/c date is: > 3 days              Patient currently is not medically stable to d/c.   Difficult to place patient Yes  Level of care: Progressive   Consultants:   General surgery.   ID  PCCM.     Procedures:  ETT 1/10 >>  R IJ CVL 1/10 >>  L brachial A-line 1/10 >>    Antimicrobials: Aztreonam 1/09 Metronidazole 1/09 Vancomycin 1/09, 1/11, 1/20 >>1/22 Cefepime 1/10 >> 1/13, 1/20 >>1/22 Clindamycin 1/10 >> 1/13 Ceftriaxone 1/13 >> 1/20 Metronidazole 1/17 >> 1/22 Meropenem 1/22 >> 1/25 Amoxicillin 1/24 (x 1 for PCN Allergy challenge) Unasyn 1/25 >> 1/27 Augmentin 1/27 >>  Significant Hospital Events:  1/10  Admitted to [facility]. General surgery consulted and debridement pursued. In the afternoon, patient developed hypotension with AF in RVR. Synchronized cardioversion unsuccessful with conversion to VT. CPR initated with ROSC after desynchronized defibrillation. 1/11 Debridement 1/18 Mucus plugging 1/19 Bronchoscopy >> mucus plug on Rt, airway collapse with exhalation 1/20 Persistent fever, change ABx 1/24 Increased vent requirements, habitus related +/- mucus plugging 1/25 Hypotensive to SBP 80s, Levo resumed, minimally responsive overnight off sedation, CT Head negative 1/26 Stable vent/pressor requirements, bronched, tracheostomy 1/29 start hydrotherapy 1/30 start lovenox/coumadin 2/2 Tolerating PS 2/4 did TC for 12 hours 2/5 Did 24 hours TC 2/6 Continue TC  2/7 Try dilaudid with turns/dressing change for better pain control - longer acting, fentanyl IV d/c'd 2/8 Increase standing oxy, dilaudid PRN for turns/dressing changes, downgrade to progressive    Subjective: No chest pain or sob, no nausea, or vomiting.  Objective: Vitals:   07/12/20 2000 07/13/20 0819 07/13/20 1140 07/13/20 1502  BP: 107/65     Pulse: (!) 109 (!) 111 (!) 109 100  Resp: 19 20 18 14   Temp: 98 F (36.7 C)     TempSrc: Oral     SpO2: 98% 99% 99% 96%  Weight:      Height:        Intake/Output Summary (Last 24 hours) at 07/13/2020 1544 Last data filed at 07/13/2020 1525 Gross per 24 hour  Intake 1083 ml  Output 6850 ml  Net -5767 ml   Filed Weights   07/10/20 0304 07/11/20  0139 07/12/20 0538  Weight: (!) 242.6 kg (!) 242.8 kg (!) 246.9 kg    Examination:  General exam: Morbidly obese patient, s/p trach on 5 L of oxygen Respiratory system: Air entry fair, no wheezing, s/p trach and on 5 L of oxygen  cardiovascular system: S1-S2 heard, regular rate rhythm, no JVD Gastrointestinal system: Abdomen is soft, nontender, nondistended bowel sounds normal Central nervous system: Alert and oriented, grossly nonfocal Extremities: 2+ leg edema present Skin: Pressure injury on the heels psychiatry: Mood is is appropriate   Data Reviewed: I have personally reviewed following labs and imaging studies  CBC: Recent Labs  Lab 07/08/20 0105 07/09/20 0125 07/10/20 0333 07/11/20 0246 07/13/20 0421  WBC 9.2 10.4 9.7 10.2 8.1  HGB 8.6* 8.6* 8.8* 8.7* 8.5*  HCT 29.0* 28.8* 29.9* 27.8* 26.8*  MCV 95.7 94.1 93.7 90.3 88.4  PLT 301 284 282 263 229    Basic Metabolic Panel: Recent Labs  Lab 07/08/20 0105 07/09/20 0125 07/10/20 0333 07/11/20 0246 07/13/20 0421  NA 144 142 137 133* 133*  K 4.2 4.3 4.1 4.2 3.7  CL 106 105 99 96* 94*  CO2 26 26 27 26 29   GLUCOSE 219* 186* 183* 199* 199*  BUN 37* 38* 31* 25* 10  CREATININE 0.93 0.97 1.04 0.99 1.00  CALCIUM 8.7* 8.7* 8.6* 8.3* 8.5*    GFR: Estimated Creatinine Clearance: 182.2 mL/min (by C-G formula based on SCr of 1 mg/dL).  Liver Function Tests: No results for input(s): AST, ALT, ALKPHOS, BILITOT, PROT, ALBUMIN in the last 168 hours.  CBG: Recent Labs  Lab 07/12/20 1128 07/12/20 1627 07/12/20 2132 07/13/20 0638 07/13/20 1134  GLUCAP 273* 183* 221* 168* 263*     No results found for this or any previous visit (from the past 240 hour(s)).       Radiology Studies: No results found.      Scheduled Meds: . amiodarone  200 mg Oral Daily  . amoxicillin-clavulanate  1 tablet Oral Q8H  . atorvastatin  20 mg Oral QPM  . chlorhexidine  15 mL Mouth Rinse BID  . Chlorhexidine Gluconate Cloth  6  each Topical Daily  . collagenase  1 application Topical Daily  . feeding supplement  237 mL Oral BID BM  . insulin aspart  0-20 Units Subcutaneous TID WC  . insulin aspart  0-5 Units Subcutaneous QHS  . insulin aspart  16 Units Subcutaneous TID WC  . insulin detemir  42 Units Subcutaneous BID  . levothyroxine  75 mcg Oral Daily  . mouth rinse  15 mL Mouth Rinse q12n4p  . multivitamin with minerals  1 tablet Oral Daily  . nortriptyline  25 mg Oral BID  . oxyCODONE  15 mg Oral Q6H  . pantoprazole  40 mg Oral Daily  . pregabalin  300 mg Oral BID  . Ensure Max Protein  11 oz Oral QHS  . senna-docusate  2 tablet Oral BID  . sodium chloride flush  10-40 mL Intracatheter Q12H  . sodium chloride flush  3 mL Intravenous Q12H  . Warfarin - Pharmacist Dosing Inpatient   Does not apply q1600   Continuous Infusions: . sodium chloride Stopped (06/28/20 2035)     LOS: 33 days        2036, MD [facility]   To contact the attending provider between 7A-7P or the covering provider during after hours 7P-7A, please log into the web site [email or web address] and access using universal [place] password for that web site. If you do not have the password, please call the hospital operator.  07/13/2020, 3:44 PM

## 2020-07-13 NOTE — Progress Notes (Signed)
Patient refused PM wound care to buttocks citing pain control concerns. LE wound care performed.

## 2020-07-13 NOTE — Progress Notes (Signed)
OT Note  Per report pt having staff help feed him. Pt having dressing change at this time. Discussed with NT. Pt issued reb tubing for utensils to help hold if needed due to peripheral neuropathy. NT states pt is wanting staff to feed him while he is laying on his side. Discussed using "chair position" in bed and reverse trendelenberg to optimize upright position for self feeding and sitting tolerance. If pt unable to tolerate due to wound/pain, recommend positioning toward L side with HOB increased per ST recommendations so that pt can use his dominant RUE. When working with pt previously he does not complain of butt/wound pain, but complains of back pain. Will continue to assess later date.  Luisa Dago, OT/L   Acute OT Clinical Specialist Acute Rehabilitation Services Pager [phone] Office [phone]

## 2020-07-14 DIAGNOSIS — J9602 Acute respiratory failure with hypercapnia: Secondary | ICD-10-CM | POA: Diagnosis not present

## 2020-07-14 DIAGNOSIS — Z930 Tracheostomy status: Secondary | ICD-10-CM | POA: Diagnosis not present

## 2020-07-14 DIAGNOSIS — J9601 Acute respiratory failure with hypoxia: Secondary | ICD-10-CM | POA: Diagnosis not present

## 2020-07-14 LAB — CBC
HCT: 29.5 % — ABNORMAL LOW (ref 39.0–52.0)
Hemoglobin: 8.6 g/dL — ABNORMAL LOW (ref 13.0–17.0)
MCH: 26.9 pg (ref 26.0–34.0)
MCHC: 29.2 g/dL — ABNORMAL LOW (ref 30.0–36.0)
MCV: 92.2 fL (ref 80.0–100.0)
Platelets: 222 10*3/uL (ref 150–400)
RBC: 3.2 MIL/uL — ABNORMAL LOW (ref 4.22–5.81)
RDW: 15.3 % (ref 11.5–15.5)
WBC: 7.1 10*3/uL (ref 4.0–10.5)
nRBC: 0.3 % — ABNORMAL HIGH (ref 0.0–0.2)

## 2020-07-14 LAB — GLUCOSE, CAPILLARY
Glucose-Capillary: 113 mg/dL — ABNORMAL HIGH (ref 70–99)
Glucose-Capillary: 190 mg/dL — ABNORMAL HIGH (ref 70–99)
Glucose-Capillary: 146 mg/dL — ABNORMAL HIGH (ref 70–99)
Glucose-Capillary: 210 mg/dL — ABNORMAL HIGH (ref 70–99)

## 2020-07-14 LAB — PROTIME-INR
INR: 3.5 — ABNORMAL HIGH (ref 0.8–1.2)
Prothrombin Time: 33.7 seconds — ABNORMAL HIGH (ref 11.4–15.2)

## 2020-07-14 MED ORDER — ACETAMINOPHEN 160 MG/5ML PO SOLN
650.0000 mg | Freq: Four times a day (QID) | ORAL | Status: DC | PRN
  Administered 2020-07-30: 650 mg via ORAL
  Filled 2020-07-14 (×2): qty 20.3

## 2020-07-14 MED ORDER — ACETAMINOPHEN 325 MG PO TABS
650.0000 mg | ORAL_TABLET | Freq: Four times a day (QID) | ORAL | Status: DC | PRN
  Administered 2020-07-14 – 2020-08-01 (×7): 650 mg via ORAL
  Filled 2020-07-14 (×6): qty 2

## 2020-07-14 NOTE — Progress Notes (Signed)
PROGRESS NOTE    Carl Hill  PYP:[id number] DOB: [birth year] DOA: 06/10/2020 PCP: Malka So., MD   Chief Complaint  Patient presents with  . Hyperglycemia    Brief Narrative:  50 year old male with piror h/o HTN, obesity, DM, AFib on DOAC presents with buttock pain, malaise. CT pelvis showed demonstrated necrotizing fasciitis in L buttock, L upper thigh, and perineum. Taken to OR for debridement 1/10 c/b Afib with RVR and VT arrest s/p CPR/defib. Trached 1/26,.  Currently on trach collar. He was transferred to progressive care to [facility] on 07/10/20.  Toc on board for placement.  PCCM to  change trach to cuffless when ready.  Pt seen and examined at bedside, reports he agreed for dressing changes last night. No new complaints. Is appreciative of the care he is receiving.    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  S/p trach , continue with trach collar on 28% oxygen to keep sats greater than 90%. pccm to consider changing trach collar to cuffless when ready.  SLP PMV on board.  - PT eval recommending SNF.  - pt remains on 5lit of [place] oxygen.  - frequent suctioning and pulmonary toilet.    Left buttock necrotizing fascitis with E coli , Group B strep, peptostreptococcus in wound culture Hydrotherapy 06/29/20 till 07/04/20 No further debridement.  On  augmentin for 6 months as per PCCM.  Family is requesting for repeat wound care consult.    Hypernatremia from diuresis and insensible losses:  Resolved.    Diabetes mellitus:  CBG (last 3)  Recent Labs    07/13/20 2107 07/14/20 0630 07/14/20 1128  GLUCAP 317* 113* 190*   Uncontrolled with hyperglycemia. Stopped the cortrak feeds as he is able to take soft diet.  SLP eval recommending regular diet with thin liquid.  Currently on 42 units BID, Increased novolog to 18 units TIDAC.  No changes in meds.    Acute blood loss anemia; from blood loss from wound.  S/p PRBC transfusion. H&H stable around 8.  Transfuse  to keep hemoglobin greater than 7.    Hypothyroidism:  Continue with synthroid.    Atrial fibrillation:  Paroxysmal.  Rate controlled with amiodarone 200 mg daily.   Therapeutic iNR.  pharmacy to dose coumadin.   Pressure injury present on admission:  Pressure Injury 06/10/20 Heel Right Stage 2 -  Partial thickness loss of dermis presenting as a shallow open injury with a red, pink wound bed without slough. (Active)  06/10/20 1400  Location: Heel  Location Orientation: Right  Staging: Stage 2 -  Partial thickness loss of dermis presenting as a shallow open injury with a red, pink wound bed without slough.  Wound Description (Comments):   Present on Admission: Yes   Wound care reconsulted .    Hyperlipidemia:  Resume lipitor.  DVT prophylaxis: (Warfarin) Code Status: (Full Code) Family Communication: none at bedside.   Disposition:   Status is: Inpatient  Remains inpatient appropriate because:Unsafe d/c plan and Inpatient level of care appropriate due to severity of illness   Dispo: The patient is from: Home              Anticipated d/c is to: SNF              Anticipated d/c date is: > 3 days              Patient currently is not medically stable to d/c.   Difficult to place patient Yes  Level of care: Progressive   Consultants:    General surgery.   ID  PCCM.    Procedures:  ETT 1/10 >>  R IJ CVL 1/10 >>  L brachial A-line 1/10 >>    Antimicrobials: Aztreonam 1/09 Metronidazole 1/09 Vancomycin 1/09, 1/11, 1/20 >>1/22 Cefepime 1/10 >> 1/13, 1/20 >>1/22 Clindamycin 1/10 >> 1/13 Ceftriaxone 1/13 >> 1/20 Metronidazole 1/17 >> 1/22 Meropenem 1/22 >> 1/25 Amoxicillin 1/24 (x 1 for PCN Allergy challenge) Unasyn 1/25 >> 1/27 Augmentin 1/27 >>  Significant Hospital Events:  1/10  Admitted to [facility]. General surgery consulted and debridement pursued. In the afternoon, patient developed hypotension with AF in RVR. Synchronized cardioversion unsuccessful with conversion to VT. CPR initated with ROSC after desynchronized defibrillation. 1/11 Debridement 1/18 Mucus plugging 1/19 Bronchoscopy >> mucus plug on Rt, airway collapse with exhalation 1/20 Persistent fever, change ABx 1/24 Increased vent requirements, habitus related +/- mucus plugging 1/25 Hypotensive to SBP 80s, Levo resumed, minimally responsive overnight off sedation, CT Head negative 1/26 Stable vent/pressor requirements, bronched, tracheostomy 1/29 start hydrotherapy 1/30 start lovenox/coumadin 2/2 Tolerating PS 2/4 did TC for 12 hours 2/5 Did 24 hours TC 2/6 Continue TC  2/7 Try dilaudid with turns/dressing change for better pain control - longer acting, fentanyl IV d/c'd 2/8 Increase standing oxy, dilaudid PRN for turns/dressing changes, downgrade to progressive    Subjective: No new complaints.  Objective: Vitals:   07/14/20 0859 07/14/20 1128 07/14/20 1132 07/14/20 1430  BP:  (!) 100/49    Pulse: (!) 104 (!) 105    Resp: 15 18  18   Temp:   98.5 F (36.9 C)   TempSrc:  Oral Oral   SpO2: 95% 95%    Weight:      Height:        Intake/Output Summary (Last 24 hours) at 07/14/2020 1458 Last data filed at 07/14/2020 07/16/2020 Gross per 24 hour  Intake 1120 ml  Output 5200 ml  Net -4080 ml   Filed Weights   07/11/20 0139 07/12/20  0538 07/14/20 0332  Weight: (!) 242.8 kg (!) 246.9 kg (!) 244.2 kg    Examination:  General exam: Morbidly obese patient, s/p trach on 5 L of oxygen, not in any distress.  Respiratory system: ai entry fair. No wheezing.  cardiovascular system: S1S2 + RRR , no JVD.  Gastrointestinal system: ABD IS soft, nd, nt  Central nervous system: Alert and oriented, non focal.  Extremities:2+ pedal edema present.  Skin: Pressure injury on the heels  psychiatry: mood appropriate.    Data Reviewed: I have personally reviewed following labs and imaging studies  CBC: Recent Labs  Lab 07/09/20 0125 07/10/20 0333 07/11/20 0246 07/13/20 0421 07/14/20 0628  WBC 10.4 9.7 10.2 8.1 7.1  HGB 8.6* 8.8* 8.7* 8.5* 8.6*  HCT 28.8* 29.9* 27.8* 26.8* 29.5*  MCV 94.1 93.7  90.3 88.4 92.2  PLT 284 282 263 229 222    Basic Metabolic Panel: Recent Labs  Lab 07/08/20 0105 07/09/20 0125 07/10/20 0333 07/11/20 0246 07/13/20 0421  NA 144 142 137 133* 133*  K 4.2 4.3 4.1 4.2 3.7  CL 106 105 99 96* 94*  CO2 26 26 27 26 29   GLUCOSE 219* 186* 183* 199* 199*  BUN 37* 38* 31* 25* 10  CREATININE 0.93 0.97 1.04 0.99 1.00  CALCIUM 8.7* 8.7* 8.6* 8.3* 8.5*    GFR: Estimated Creatinine Clearance: 180.8 mL/min (by C-G formula based on SCr of 1 mg/dL).  Liver Function Tests: No results for input(s): AST, ALT, ALKPHOS, BILITOT, PROT, ALBUMIN in the last 168 hours.  CBG: Recent Labs  Lab 07/13/20 1134 07/13/20 1640 07/13/20 2107 07/14/20 0630 07/14/20 1128  GLUCAP 263* 247* 317* 113* 190*     No results found for this or any previous visit (from the past 240 hour(s)).       Radiology Studies: No results found.      Scheduled Meds: . amiodarone  200 mg Oral Daily  . amoxicillin-clavulanate  1 tablet Oral Q8H  . atorvastatin  20 mg Oral QPM  . chlorhexidine  15 mL Mouth Rinse BID  . Chlorhexidine Gluconate Cloth  6 each Topical Daily  . collagenase  1 application Topical Daily  .  feeding supplement  237 mL Oral BID BM  . insulin aspart  0-20 Units Subcutaneous TID WC  . insulin aspart  0-5 Units Subcutaneous QHS  . insulin aspart  18 Units Subcutaneous TID WC  . insulin detemir  42 Units Subcutaneous BID  . levothyroxine  75 mcg Oral Daily  . mouth rinse  15 mL Mouth Rinse q12n4p  . multivitamin with minerals  1 tablet Oral Daily  . nortriptyline  25 mg Oral BID  . oxyCODONE  15 mg Oral Q6H  . pantoprazole  40 mg Oral Daily  . pregabalin  300 mg Oral BID  . Ensure Max Protein  11 oz Oral QHS  . senna-docusate  2 tablet Oral BID  . sodium chloride flush  10-40 mL Intracatheter Q12H  . sodium chloride flush  3 mL Intravenous Q12H  . Warfarin - Pharmacist Dosing Inpatient   Does not apply q1600   Continuous Infusions: . sodium chloride Stopped (06/28/20 2035)     LOS: 34 days        2036, MD [facility]   To contact the attending provider between 7A-7P or the covering provider during after hours 7P-7A, please log into the web site [email or web address] and access using universal [place] password for that web site. If you do not have the password, please call the hospital operator.  07/14/2020, 2:58 PM

## 2020-07-14 NOTE — Progress Notes (Signed)
ANTICOAGULATION CONSULT NOTE  Pharmacy Consult for Warfarin  Indication: atrial fibrillation  Patient Measurements: Height: 5\' 10"  (177.8 cm) Weight: (!) 244.2 kg (538 lb 5.8 oz) IBW/kg (Calculated) : 73 Heparin Dosing Weight: 149.7 kg  Vital Signs: Temp: 98 F (36.7 C) (02/13 0801) Temp Source: Oral (02/13 0801) BP: 106/61 (02/13 0801) Pulse Rate: 101 (02/13 0801)  Labs: Recent Labs    07/12/20 0151 07/13/20 0421 07/14/20 0628  HGB  --  8.5* 8.6*  HCT  --  26.8* 29.5*  PLT  --  229 222  LABPROT 39.4* 34.2* 33.7*  INR 4.2* 3.5* 3.5*  CREATININE  --  1.00  --     Estimated Creatinine Clearance: 180.8 mL/min (by C-G formula based on SCr of 1 mg/dL).   Assessment: 49YOM admitted with septic shock secondary to necrotizing fascitis. Was on xarelto PTA, but that is not appropriate based on patient's weight. Pharmacy has been consulted to dose warfarin.  INR still supratherapeutic at 3.5 but did not drop after holding dose again. Cautious dosing given previously supratherapeutic INRs and potential drug-drug interaction with amiodarone and augmentin. Patient s/p Cortrak now eating 100% of meals. Hgb stable at 8.6 and pltc is stable within normal range. No bleeding noted.    Goal of Therapy:  INR 2-3 Monitor platelets by anticoagulation protocol: Yes  Plan:  Hold warfarin today Daily INR and CBC  07/16/20, PharmD PGY1 Acute Care Pharmacy Resident 07/14/2020 8:29 AM  Please check [email or web address] for unit specific pharmacy phone numbers.

## 2020-07-14 NOTE — Progress Notes (Signed)
Dressing Change done.

## 2020-07-15 ENCOUNTER — Inpatient Hospital Stay (HOSPITAL_COMMUNITY): Payer: Medicaid Other

## 2020-07-15 DIAGNOSIS — J9601 Acute respiratory failure with hypoxia: Secondary | ICD-10-CM | POA: Diagnosis not present

## 2020-07-15 DIAGNOSIS — J9602 Acute respiratory failure with hypercapnia: Secondary | ICD-10-CM | POA: Diagnosis not present

## 2020-07-15 DIAGNOSIS — A419 Sepsis, unspecified organism: Secondary | ICD-10-CM | POA: Diagnosis not present

## 2020-07-15 DIAGNOSIS — Z930 Tracheostomy status: Secondary | ICD-10-CM | POA: Diagnosis not present

## 2020-07-15 DIAGNOSIS — N493 Fournier gangrene: Secondary | ICD-10-CM | POA: Diagnosis not present

## 2020-07-15 LAB — GLUCOSE, CAPILLARY
Glucose-Capillary: 157 mg/dL — ABNORMAL HIGH (ref 70–99)
Glucose-Capillary: 191 mg/dL — ABNORMAL HIGH (ref 70–99)
Glucose-Capillary: 156 mg/dL — ABNORMAL HIGH (ref 70–99)
Glucose-Capillary: 178 mg/dL — ABNORMAL HIGH (ref 70–99)

## 2020-07-15 LAB — PROTIME-INR
INR: 2.8 — ABNORMAL HIGH (ref 0.8–1.2)
Prothrombin Time: 28.3 seconds — ABNORMAL HIGH (ref 11.4–15.2)

## 2020-07-15 MED ORDER — WARFARIN SODIUM 3 MG PO TABS
6.0000 mg | ORAL_TABLET | Freq: Once | ORAL | Status: AC
  Administered 2020-07-15: 6 mg via ORAL
  Filled 2020-07-15: qty 2

## 2020-07-15 NOTE — Progress Notes (Signed)
NAME:  Carl Hill, MRN:  [id number], DOB:  [birth year], LOS: 12 ADMISSION DATE:  06/10/2020, CONSULTATION DATE:  06/10/2020 REFERRING MD:  Lorin Mercy, CHIEF COMPLAINT:  Severe Sepsis    Brief History:  50 year old male who presented with buttock pain, fatigue and malaise.  Found to have hyperglycemia. CT pelvis demonstrated necrotizing fasciitis in L buttock, L upper thigh, and perineum. Taken to OR for debridement 1/10 c/b Afib with RVR and VT arrest s/p CPR/defib. Trached 1/26, remains on trach collar   Past Medical History:  HTN, obesity, DM, AFib on [facility] Events:  1/10  Admitted to [facility]. General surgery consulted and debridement pursued. In the afternoon, patient developed hypotension with AF in RVR. Synchronized cardioversion unsuccessful with conversion to VT. CPR initated with ROSC after desynchronized defibrillation. 1/11 Debridement 1/18 Mucus plugging 1/19 Bronchoscopy >> mucus plug on Rt, airway collapse with exhalation 1/20 Persistent fever, change ABx 1/24 Increased vent requirements, habitus related +/- mucus plugging 1/25 Hypotensive to SBP 80s, Levo resumed, minimally responsive overnight off sedation, CT Head negative 1/26 Stable vent/pressor requirements, bronched, tracheostomy 1/29 start hydrotherapy 1/30 start lovenox/coumadin 2/2 Tolerating PS 2/4 did TC for 12 hours 2/5 Did 24 hours TC 2/6 Continue TC  2/7 Try dilaudid with turns/dressing change for better pain control - longer acting, fentanyl IV d/c'd 2/8 Increase standing oxy, dilaudid PRN for turns/dressing changes, downgrade to progressive  Consults:  General Surgery ID s/o 1/25  Procedures:  ETT 1/10 >> 1/26 1/26 Tstomy >> R IJ CVL 1/10 >> out L brachial A-line 1/10 >>out   Significant Diagnostic Tests:   CT abd/pelvis 1/10 >> Extensive subcutaneous emphysema in the medial aspect of the upper thighs extending through perineum and superiorly to the medial left buttock. Gas  extends into the pelvis surrounding the anus inferiorly and tracking superiorly along the left wall of the rectum.  CT C/A/P 1/22 >> Imaging limited due to body habitus. No gross fluid collection or gas seen. Bilateral consolidation with dependent atelectasis  CT Head 1/25 >> No acute intracranial abnormality  Micro Data:  COVID/Flu 1/10 >> negative MRSA PCR 1/10 >> negative L buttocks wound 1/10 >> E. Coli (pansensitive), Streptococcus infantarious (sensitive to PCN, ceftriaxone, vanc), Peptostreptococcus species, rare GBS Blood 1/10 >> negative BAL 1/20 >> normal flora Resp Cx 1/26 > Normal flora  Antimicrobials:  Aztreonam 1/09 Metronidazole 1/09 Vancomycin 1/09, 1/11, 1/20 >>1/22 Cefepime 1/10 >> 1/13, 1/20 >>1/22 Clindamycin 1/10 >> 1/13 Ceftriaxone 1/13 >> 1/20 Metronidazole 1/17 >> 1/22 Meropenem 1/22 >> 1/25 Amoxicillin 1/24 (x 1 for PCN Allergy challenge) Unasyn 1/25 >> 1/27 Augmentin 1/27 >>  Interim History / Subjective:    Pt awake and talkative, on 5L ATC 28%   Objective   Blood pressure (!) 96/47, pulse (!) 110, temperature 98 F (36.7 C), temperature source Oral, resp. rate 18, height _0  (1.778 m), weight (!) 244.8 kg, SpO2 97 %.    FiO2 (%):  [28 %] 28 %   Intake/Output Summary (Last 24 hours) at 07/15/2020 1651 Last data filed at 07/15/2020 1200 Gross per 24 hour  Intake 1830 ml  Output 5500 ml  Net -3670 ml   Filed Weights   07/12/20 0538 07/14/20 0332 07/15/20 0451  Weight: (!) 246.9 kg (!) 244.2 kg (!) 244.8 kg     General:  Morbidly obese, M awake and in no distress HEENT: MM pink/moist, trach in place, minimal secretions  Neuro: awake alert and conversational, following commands CV: s1s2  rrr, no m/r/g PULM: lungs clear on trach collar, no distress, no wheezing or rhonchi GI: soft, bsx4 active  Extremities: warm/dry, 1+ edema  Skin: no rashes or lesions       Resolved problems:  VT arrest 1/10, Ileus 1/15, Elevated LFTs from  shock, Metabolic acidosis with lactic acidosis, Septic shock Acute metabolic encephalopathy 2nd to sepsis, hypoxia, hypercapnia.\ Hypernatremia from diuresis and insensible losses - improved   Assessment & Plan:   Acute on chronic hypoxic/hypercapnic respiratory failure in setting of sepsis, HCAP, recurrent mucus plugging  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 Bronchomalacia. Probable sleep disordered breathing. Pt tolerating trach collar 5L 28% day and night, doing well with PMV P: -Doing well on trach collar since 2/5, ok to downsize to Shiley #6 cuffless tomorrow -working with PT, hopefully will progress to the point of standing before decannulation -pulmonary hygiene with nebulizers -continue routine trach care -SLP, PMV    Left buttock necrotizing fasciitis with E coli, Group B Streptococcus, and Peptostreptococcus in wound culture. Bleeding from sacral site - no further issue - Hydrotherapy 1/29-2/3 - Surgery following. No further debridement indicated - Long course of antibiotics, currently on augmentin (6 month plan) - Thrombi pads and TMA soaked dressing for bleed  Acute blood loss anemia, likely from frequent blood draws -transfused earlier in hospital course, Hgb stable  Paroxysmal Afib - currently NSR Hx of HLD. - continue amiodarone, lipitor - coumadin per pharmacy. 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 -Per [facility]   PCCM will continue to follow weekly    Best practice (evaluated daily)  Diet: Continue TF DVT prophylaxis: lovenox/coumadin GI prophylaxis: Protonix  Mobility: Bedrest Disposition: ICU Family:  Code status: Full Code  Labs    CMP Latest Ref Rng & Units 07/13/2020 07/11/2020 07/10/2020  Glucose 70 - 99 mg/dL 199(H) 199(H) 183(H)  BUN 6 - 20 mg/dL 10 25(H) 31(H)  Creatinine 0.61 - 1.24 mg/dL 1.00 0.99 1.04  Sodium 135 - 145 mmol/L 133(L) 133(L) 137  Potassium 3.5 -  5.1 mmol/L 3.7 4.2 4.1  Chloride 98 - 111 mmol/L 94(L) 96(L) 99  CO2 22 - 32 mmol/L _0 Calcium 8.9 - 10.3 mg/dL 8.5(L) 8.3(L) 8.6(L)  Total Protein 6.5 - 8.1 g/dL - - -  Total Bilirubin 0.3 - 1.2 mg/dL - - -  Alkaline Phos 38 - 126 U/L - - -  AST 15 - 41 U/L - - -  ALT 0 - 44 U/L - - -    CBC Latest Ref Rng & Units 07/14/2020 07/13/2020 07/11/2020  WBC 4.0 - 10.5 K/uL 7.1 8.1 10.2  Hemoglobin 13.0 - 17.0 g/dL 8.6(L) 8.5(L) 8.7(L)  Hematocrit 39.0 - 52.0 % 29.5(L) 26.8(L) 27.8(L)  Platelets 150 - 400 K/uL 222 229 263   ABG    Component Value Date/Time   PHART 7.401 06/23/2020 1204   PCO2ART 57.4 (H) 06/23/2020 1204   PO2ART 73 (L) 06/23/2020 1204   HCO3 35.5 (H) 06/23/2020 1204   TCO2 37 (H) 06/23/2020 1204   ACIDBASEDEF 2.0 06/13/2020 1221   O2SAT 94.0 06/23/2020 1204    CBG (last 3)  Recent Labs    07/14/20 2110 07/15/20 0609 07/15/20 1153  GLUCAP 210* 157* 191*   Signature:   Otilio Carpen Ariadne Rissmiller, PA-C [facility] See Amion for pager If no response to pager , please call [phone] until 7pm After 7:00 pm call Elink  [phone]

## 2020-07-15 NOTE — Progress Notes (Signed)
ANTICOAGULATION CONSULT NOTE  Pharmacy Consult for Warfarin  Indication: atrial fibrillation  Patient Measurements: Height: 5\' 10"  (177.8 cm) Weight: (!) 244.8 kg (539 lb 11 oz) IBW/kg (Calculated) : 73 Heparin Dosing Weight: 149.7 kg  Vital Signs: Temp: 97.7 F (36.5 C) (02/14 0827) Temp Source: Oral (02/14 0827) BP: 120/84 (02/14 0836) Pulse Rate: 109 (02/14 0836)  Labs: Recent Labs    07/13/20 0421 07/14/20 0628 07/15/20 0300  HGB 8.5* 8.6*  --   HCT 26.8* 29.5*  --   PLT 229 222  --   LABPROT 34.2* 33.7* 28.3*  INR 3.5* 3.5* 2.8*  CREATININE 1.00  --   --     Estimated Creatinine Clearance: 181.1 mL/min (by C-G formula based on SCr of 1 mg/dL).   Assessment: 49YOM admitted with septic shock secondary to necrotizing fascitis. Was on Xarelto PTA, but that is not appropriate based on patient's weight. Pharmacy has been consulted to dose warfarin.  INR now within therapeutic range at 2.8. Warfarin held 2/4-2/7 & 2/10-2/13. Cautious dosing given previously supratherapeutic INRs and potential drug-drug interaction with amiodarone and Augmentin. Patient s/p Cortrak now eating 60-100% of meals. Hgb stable at 8.6 and pltc is stable within normal range. No bleeding noted.    Goal of Therapy:  INR 2-3 Monitor platelets by anticoagulation protocol: Yes  Plan:  Warfarin 6 mg PO tonight Daily INR and CBC Monitor for s/sx of bleeding  Thank you for involving pharmacy in this patient's care.  07-20-1970, PharmD, BCPS Clinical Pharmacist Clinical phone for 07/15/2020 until 3p is [phone] 07/15/2020 8:59 AM  **Pharmacist phone directory can be found on [email or web address] listed under [facility]**

## 2020-07-15 NOTE — Consult Note (Signed)
WOC Nurse Consult Note: Patient receiving care in [facility] 3E17. Reason for Consult: "family requesting for revaluation of his wounds" Wound type: Per my Secure Chat conversation with Dr. Thurman Coyer this morning, the mother wants someone to look at his heels and the buttocks.  I explained that the buttocks wounds are handled by the surgery service performing the surgery.  I would look at his heels.  The patient explained the callous on the right plantar heel was a gapping wound at one time.  He is followed by outpatient wound center for this heel wound area. Pressure Injury POA: Yes/No/NA Measurement: 2 cm x 2 cm closed callous Wound ZOX:WRUEA, dry Drainage (amount, consistency, odor) none Periwound: heavy, thick, dried tissue on feet Dressing procedure/placement/frequency: Apply iodine from the swabsticks or swab pads from clean utility to right plantar heel callous.  Allow to air dry.  Once discharged, he should resume follow up with the outpatient wound center. Thank you for the consult.  Discussed plan of care with the patient and bedside nurse.  WOC nurse will not follow at this time.  Please re-consult the WOC team if needed.  Helmut Muster, RN, MSN, CWOCN, CNS-BC, pager [phone]

## 2020-07-15 NOTE — Progress Notes (Signed)
Physical Therapy Treatment Patient Details Name: Carl Hill MRN: [id number] DOB: 1970-08-22 Today's Date: 07/15/2020    History of Present Illness Pt is a 50 year old male who presented with buttock pain, fatigue and malaise.  Found to have hyperglycemia. CT pelvis demonstrated necrotizing fasciitis in L buttock, L upper thigh, and perineum. Taken to OR for debridement 1/10 c/b Afib with RVR and VT arrest s/p CPR/defib. Trached 1/26, remains on full vent support. PMHx: HTN, obesity, DM, AFib.  As of 07/04/20 started TC trials.    PT Comments    Pt tolerated highest tilt yet at 52 degrees for 5 mins.  Over 20 mins total time spent in tilt to come up and go back down.  Limited by low BPs despite therapist's attempts to keep him moving muscles to encourage return blood flow we dipped (lowest 70s/50s) with HRs in the 140s while exercising in tilt.  We attempted chair mode in hopes he may be ready to trial some EOB sitting, however, his bottom could not yet handle the pressure.  IV dilaudid given by RN during our session.  OT to see tomorrow with Vernie Murders Bed rep, Janyth Pupa at 8:30 am.     Follow Up Recommendations  SNF     Equipment Recommendations  Hospital bed;Other (comment) (hoyer lift, air mattress, all bariatric equipment)    Recommendations for Other Services       Precautions / Restrictions Precautions Precautions: Fall;Other (comment) Precaution Comments: TC, rectal tube, large buttocks wound, hydro on hold; PMSV    Mobility  Bed Mobility Overal bed mobility: Needs Assistance Bed Mobility: Rolling Rolling: Mod assist;+2 for physical assistance         General bed mobility comments: mod assist to roll once pt positioned to one side of the bed and bed extenders put out on the side we are rolling towards.  Mostly assisting with hand reaching until he gets close enough to the rail to pull himself and top leg moving over to the opposite side of the bed.  Two people mod to max  needed to scoot to [facility] and to pull pt backwards more towards the center of the bed once in sidelying. Angle: 52 degrees Total Minutes in Angle: 5 minutes (over 20 mins in some form of tilt) Patient Response: Cooperative  Transfers                    Ambulation/Gait                 Stairs             Wheelchair Mobility    Modified Rankin (Stroke Patients Only)       Balance                                            Cognition Arousal/Alertness: Awake/alert Behavior During Therapy: Anxious Overall Cognitive Status: Within Functional Limits for tasks assessed                                 General Comments: anxiety is getting better with therapist rapport      Exercises General Exercises - Upper Extremity Shoulder Flexion: AROM;Both;10 reps;Theraband Theraband Level (Shoulder Flexion): Level 2 (Red) Shoulder Horizontal ABduction: AROM;Both;10 reps;Theraband Elbow Flexion: AROM;Both;10 reps;Theraband Theraband Level (Elbow Flexion): Level 2 (  Red) General Exercises - Lower Extremity Quad Sets: AROM;Both;10 reps;Other (comment) (2 sets while tilting)    General Comments General comments (skin integrity, edema, etc.): Pt on 28% TC 5L today with VSS, BPs did dip from 110s/70s to 90s/50s with tilting with HR up to 145 during tilt while exercising.  Pt ultimately lightheaded at 52 degree of tilt, so only could stay there ~ 5 mins (attempted UE exericses and LE exercises to increase retrun blood flow to the head).  Attempted full chair mode before tilt today in hopes that he could tolerate trying to sit EOB, but unable to even come up to 45 degrees of chair mode before buttocks pain was too much.  Tolerated verticalization well.  IV dilaudid given by RN during our session.  May see if pre-medicating may help him tolerate chair mode trial again next session.      Pertinent Vitals/Pain Pain Assessment: Faces Faces Pain Scale:  Hurts whole lot Pain Location: buttocks and back Pain Descriptors / Indicators: Grimacing;Guarding Pain Intervention(s): Limited activity within patient's tolerance;Monitored during session;Repositioned    Home Living                      Prior Function            PT Goals (current goals can now be found in the care plan section) Acute Rehab PT Goals Patient Stated Goal: to be able to return home with his Mom and do what he did before PT Goal Formulation: With patient Time For Goal Achievement: 07/29/20 Potential to Achieve Goals: Fair Progress towards PT goals: Progressing toward goals (goals updated)    Frequency    Min 3X/week (splitting week with OT)      PT Plan Current plan remains appropriate    Co-evaluation              AM-PAC PT "6 Clicks" Mobility   Outcome Measure  Help needed turning from your back to your side while in a flat bed without using bedrails?: A Lot Help needed moving from lying on your back to sitting on the side of a flat bed without using bedrails?: Total Help needed moving to and from a bed to a chair (including a wheelchair)?: Total Help needed standing up from a chair using your arms (e.g., wheelchair or bedside chair)?: Total Help needed to walk in hospital room?: Total Help needed climbing 3-5 steps with a railing? : Total 6 Click Score: 7    End of Session Equipment Utilized During Treatment: Oxygen Activity Tolerance: Patient limited by pain Patient left: in bed;with call bell/phone within reach Nurse Communication: Patient requests pain meds PT Visit Diagnosis: Muscle weakness (generalized) (M62.81);Other abnormalities of gait and mobility (R26.89)     Time: 0837-1006 (2 units no charge, pt chatty) PT Time Calculation (min) (ACUTE ONLY): 89 min  Charges:  $Therapeutic Exercise: 8-22 mins $Therapeutic Activity: 38-52 mins                     Corinna Capra, PT, DPT  Acute Rehabilitation [phone]  pager [phone]) [phone] office

## 2020-07-15 NOTE — Progress Notes (Signed)
PROGRESS NOTE    Carl Hill  STM:[id number] DOB: 20-Apr-1971 DOA: 06/10/2020 PCP: Malka So., MD   Chief Complaint  Patient presents with  . Hyperglycemia    Brief Narrative:  50 year old male with piror h/o HTN, obesity, DM, AFib on DOAC presents with buttock pain, malaise. CT pelvis showed demonstrated necrotizing fasciitis in L buttock, L upper thigh, and perineum. Taken to OR for debridement 1/10 c/b Afib with RVR and VT arrest s/p CPR/defib. Trached 1/26,.  Currently on trach collar. He was transferred to progressive care to [facility] on 07/10/20.  Toc on board for placement.  PCCM to  change trach to cuffless when ready.  Pt seen and examined at bedside, reports occasional blurry vision .    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  S/p trach , continue with trach collar on 28% oxygen to keep sats greater than 90%. pccm to consider changing trach collar to cuffless when ready.  SLP PMV on board.  - PT eval recommending SNF.  - pt remains on 5lit of oxygen .  - frequent suctioning and pulmonary toilet.  pCCM to follow up.    Left buttock necrotizing fascitis with E coli , Group B strep, peptostreptococcus in wound culture Hydrotherapy 06/29/20 till 07/04/20 No further debridement. General surgery recommended would care to follow up.  On augmentin for 6  months as per PCCM.  Family is requesting for repeat wound care consult.    Hypernatremia from diuresis and insensible losses:  Resolved.    Diabetes mellitus:  CBG (last 3)  Recent Labs    07/14/20 2110 07/15/20 0609 07/15/20 1153  GLUCAP 210* 157* 191*   Uncontrolled with hyperglycemia. Stopped the cortrak feeds as he is able to take soft diet.  SLP eval recommending regular diet with thin liquid.  Currently on 42 units BID, Increased novolog to 18 units TIDAC.  No changes in meds.    Acute blood loss anemia; from blood loss from wound.  S/p PRBC transfusion. H&H stable around 8.  Transfuse  to keep hemoglobin greater than 7.    Hypothyroidism:  Continue with synthroid.    Atrial fibrillation:  Paroxysmal.  Rate controlled with amiodarone 200 mg daily.   Therapeutic iNR.  pharmacy to dose coumadin.   Pressure injury present on admission:  Pressure Injury 06/10/20 Heel Right Stage 2 -  Partial thickness loss of dermis presenting as a shallow open injury with a red, pink wound bed without slough. (Active)  06/10/20 1400  Location: Heel  Location Orientation: Right  Staging: Stage 2 -  Partial thickness loss of dermis presenting as a shallow open injury with a red, pink wound bed without slough.  Wound Description (Comments):   Present on Admission: Yes   Wound care reconsulted .    Hyperlipidemia:  Resume lipitor.    Blurry vision:  Will get CT head without contrast for further evaluation.    Hypotension:  Pt asymptomatic. Not on any anti hypertensive meds.       DVT prophylaxis: (Warfarin) Code Status: (Full Code) Family Communication: none at bedside.   Disposition:   Status is: Inpatient  Remains inpatient appropriate because:Unsafe d/c plan and Inpatient level of care appropriate due to severity of illness   Dispo: The patient is from: Home              Anticipated d/c is to: SNF              Anticipated d/c date is: > 3 days               Patient currently is not medically stable to d/c.   Difficult to place patient Yes  Level of care: Progressive   Consultants:   General surgery.   ID  PCCM.    Procedures:  ETT 1/10 >>  R IJ CVL 1/10 >>  L brachial A-line 1/10 >>    Antimicrobials: Aztreonam 1/09 Metronidazole 1/09 Vancomycin 1/09, 1/11, 1/20 >>1/22 Cefepime 1/10 >> 1/13, 1/20 >>1/22 Clindamycin 1/10 >> 1/13 Ceftriaxone 1/13 >> 1/20 Metronidazole 1/17 >> 1/22 Meropenem 1/22 >> 1/25 Amoxicillin 1/24 (x 1 for PCN Allergy challenge) Unasyn 1/25 >> 1/27 Augmentin 1/27 >>  Significant Hospital Events:  1/10  Admitted to [facility]. General surgery consulted and debridement pursued. In the afternoon, patient developed hypotension with AF in RVR. Synchronized cardioversion unsuccessful with conversion to VT. CPR initated with ROSC after desynchronized defibrillation. 1/11 Debridement 1/18 Mucus plugging 1/19 Bronchoscopy >> mucus plug on Rt, airway collapse with exhalation 1/20 Persistent fever, change ABx 1/24 Increased vent requirements, habitus related +/- mucus plugging 1/25 Hypotensive to SBP 80s, Levo resumed, minimally responsive overnight off sedation, CT Head negative 1/26 Stable vent/pressor requirements, bronched, tracheostomy 1/29 start hydrotherapy 1/30 start lovenox/coumadin 2/2 Tolerating PS 2/4 did TC for 12 hours 2/5 Did 24 hours TC 2/6 Continue TC  2/7 Try dilaudid with turns/dressing change for better pain control - longer acting, fentanyl IV d/c'd 2/8 Increase standing oxy, dilaudid PRN for turns/dressing changes, downgrade to progressive    Subjective: Occasional blurry vision.  Objective: Vitals:   07/15/20 0836 07/15/20 1056 07/15/20 1100 07/15/20 1200  BP: 120/84  (!) 90/56 (!) 96/47  Pulse: (!) 109 (!) 106 (!) 108 (!) 105  Resp: (!) 22 18 (!) 22 19  Temp:      TempSrc:      SpO2: 97% 95% 91% 95%  Weight:      Height:        Intake/Output Summary (Last 24 hours) at  07/15/2020 1341 Last data filed at 07/15/2020 1200 Gross per 24 hour  Intake 2070 ml  Output 5850 ml  Net -3780 ml   Filed Weights   07/12/20 0538 07/14/20 0332 07/15/20 0451  Weight: (!) 246.9 kg (!) 244.2 kg (!) 244.8 kg    Examination:  General exam: Morbidly obese patient, on trach collar on 5 lit of Oxygen.  Respiratory system: Air entry fair, no wheezing heard.  cardiovascular system: S1S2 heard, tachycardic, irregular, no JVD, pedal edema. Gastrointestinal system: abdomen is soft non tender non distended bowel sounds wnl.  Central nervous system: alert and oriented. Non focal Extremities:2+ pedal edema present.  Skin: pressure injury on the heels  psychiatry: mood is appropriate.    Data Reviewed: I have personally reviewed following labs and imaging studies  CBC: Recent Labs  Lab 07/09/20  [id number] 07/10/20 0333 07/11/20 0246 07/13/20 0421 07/14/20 0628  WBC 10.4 9.7 10.2 8.1 7.1  HGB 8.6* 8.8* 8.7* 8.5* 8.6*  HCT 28.8* 29.9* 27.8* 26.8* 29.5*  MCV 94.1 93.7 90.3 88.4 92.2  PLT 284 282 263 229 222    Basic Metabolic Panel: Recent Labs  Lab 07/09/20 0125 07/10/20 0333 07/11/20 0246 07/13/20 0421  NA 142 137 133* 133*  K 4.3 4.1 4.2 3.7  CL 105 99 96* 94*  CO2 26 27 26 29   GLUCOSE 186* 183* 199* 199*  BUN 38* 31* 25* 10  CREATININE 0.97 1.04 0.99 1.00  CALCIUM 8.7* 8.6* 8.3* 8.5*    GFR: Estimated Creatinine Clearance: 181.1 mL/min (by C-G formula based on SCr of 1 mg/dL).  Liver Function Tests: No results for input(s): AST, ALT, ALKPHOS, BILITOT, PROT, ALBUMIN in the last 168 hours.  CBG: Recent Labs  Lab 07/14/20 1128 07/14/20 1633 07/14/20 2110 07/15/20 0609 07/15/20 1153  GLUCAP 190* 146* 210* 157* 191*     No results found for this or any previous visit (from the past 240 hour(s)).       Radiology Studies: No results found.      Scheduled Meds: . amiodarone  200 mg Oral Daily  . amoxicillin-clavulanate  1 tablet Oral Q8H   . atorvastatin  20 mg Oral QPM  . chlorhexidine  15 mL Mouth Rinse BID  . Chlorhexidine Gluconate Cloth  6 each Topical Daily  . collagenase  1 application Topical Daily  . feeding supplement  237 mL Oral BID BM  . insulin aspart  0-20 Units Subcutaneous TID WC  . insulin aspart  0-5 Units Subcutaneous QHS  . insulin aspart  18 Units Subcutaneous TID WC  . insulin detemir  42 Units Subcutaneous BID  . levothyroxine  75 mcg Oral Daily  . mouth rinse  15 mL Mouth Rinse q12n4p  . multivitamin with minerals  1 tablet Oral Daily  . nortriptyline  25 mg Oral BID  . oxyCODONE  15 mg Oral Q6H  . pantoprazole  40 mg Oral Daily  . pregabalin  300 mg Oral BID  . Ensure Max Protein  11 oz Oral QHS  . senna-docusate  2 tablet Oral BID  . sodium chloride flush  10-40 mL Intracatheter Q12H  . sodium chloride flush  3 mL Intravenous Q12H  . warfarin  6 mg Oral ONCE-1600  . Warfarin - Pharmacist Dosing Inpatient   Does not apply q1600   Continuous Infusions: . sodium chloride Stopped (06/28/20 2035)     LOS: 35 days        2036, MD [facility]   To contact the attending provider between 7A-7P or the covering provider during after hours 7P-7A, please log into the web site [email or web address] and access using universal [place] password for that web site. If you do not have the password, please call the hospital operator.  07/15/2020, 1:41 PM

## 2020-07-16 DIAGNOSIS — J9601 Acute respiratory failure with hypoxia: Secondary | ICD-10-CM | POA: Diagnosis not present

## 2020-07-16 DIAGNOSIS — J9602 Acute respiratory failure with hypercapnia: Secondary | ICD-10-CM | POA: Diagnosis not present

## 2020-07-16 DIAGNOSIS — Z930 Tracheostomy status: Secondary | ICD-10-CM | POA: Diagnosis not present

## 2020-07-16 LAB — PROTIME-INR
INR: 2.4 — ABNORMAL HIGH (ref 0.8–1.2)
Prothrombin Time: 25.5 seconds — ABNORMAL HIGH (ref 11.4–15.2)

## 2020-07-16 LAB — GLUCOSE, CAPILLARY
Glucose-Capillary: 150 mg/dL — ABNORMAL HIGH (ref 70–99)
Glucose-Capillary: 225 mg/dL — ABNORMAL HIGH (ref 70–99)
Glucose-Capillary: 125 mg/dL — ABNORMAL HIGH (ref 70–99)
Glucose-Capillary: 137 mg/dL — ABNORMAL HIGH (ref 70–99)

## 2020-07-16 MED ORDER — WARFARIN SODIUM 3 MG PO TABS
6.0000 mg | ORAL_TABLET | Freq: Once | ORAL | Status: AC
  Administered 2020-07-16: 6 mg via ORAL
  Filled 2020-07-16: qty 2

## 2020-07-16 NOTE — Progress Notes (Addendum)
Occupational Therapy Treatment Patient Details Name: Carl Hill MRN: [id number] DOB: 04-14-1971 Today's Date: 07/16/2020    History of present illness Pt is a 50 year old male who presented with buttock pain, fatigue and malaise.  Found to have hyperglycemia. CT pelvis demonstrated necrotizing fasciitis in L buttock, L upper thigh, and perineum. Taken to OR for debridement 1/10 c/b Afib with RVR and VT arrest s/p CPR/defib. Trached 1/26, remains on full vent support. PMHx: HTN, obesity, DM, AFib.  As of 07/04/20 started TC trials.   OT comments  Upon arrival, pt sidelying (on left) and agreeable with therapy. Pt seen with Kreg bed rep verticalization of patient. Pt performing rolling bed with Mod A. Progressing from supine to 53 degree this session; transitioning from 15, 40, and then 53 degrees. Tolerating ~3-5 minutes at 15 and 40 degrees, increased pain at 53 degree and tolerating 1 min for BP then return to 15 degree due to pain a bed, legs, and wound at buttocks. Pt agreeable to attempt self feeding with built up handles on utensils; however, with increased pain, declined to transition back to supine for modified chair position. Pt eating apple sauce in sidelying with built up handled spoon. Pt agreeable to attempt using adaptive utensils at meals and report progress at next OT session. Continue to recommend dc to SNF and will continue to follow acutely as admitted. Updated goals.  BP: Supine 111/65, 15 degrees 137/66, 40 degrees 117/84, 53 degrees 112/51, and 15 degrees 140/86. Max HR 133; SpO2 above 90s on 5L at FiO2 28 with PMSV in place.   33Follow Up Recommendations  SNF;Supervision/Assistance - 24 hour    Equipment Recommendations  Wheelchair (measurements OT);Wheelchair cushion (measurements OT);Hospital bed;3 in 1 bedside commode    Recommendations for Other Services      Precautions / Restrictions Precautions Precautions: Fall;Other (comment) Precaution Comments: TC,  rectal tube, large buttocks wound, hydro on hold; PMSV       Mobility Bed Mobility Overal bed mobility: Needs Assistance Bed Mobility: Rolling Rolling: Mod assist;+2 for physical assistance         General bed mobility comments: Mod A for manaing hips to roll from sidlying<>supine. Pt using bed rails to grasp and then facilitate trunk  Transfers                 General transfer comment: Kreg tilt bed utilized.    Balance                                           ADL either performed or assessed with clinical judgement   ADL Overall ADL's : Needs assistance/impaired Eating/Feeding: Set up;Bed level Eating/Feeding Details (indicate cue type and reason): Pt sidlying in bed; in too much pain to return to supine and then sitting. Pt demonstrating understnaidng for use of red built up handle. Pt eating apply sauce while holding cup in LUE and then managing spoon in RUE                                   General ADL Comments: Focused on standing and then monitoring HR and BP     Vision       Perception     Praxis      Cognition Arousal/Alertness: Awake/alert Behavior During Therapy: Anxious Overall Cognitive  Status: Within Functional Limits for tasks assessed                                 General Comments: engaging and communicating needs well. Demonstrating good breathing tehcniques for pain and anxiety        Exercises Exercises: General Upper Extremity;General Lower Extremity General Exercises - Upper Extremity Shoulder Flexion: AROM;Both;5 reps;Standing (kreg bed) General Exercises - Lower Extremity Quad Sets:  (2 sets while tilting)   Shoulder Instructions       General Comments 28% TC 5L today with VSS. BP supine 11/65, 15* 137/66, 40* 117/84, 53* 112/51, and 15* 140/86. HR elevating to 130s in standing possibly due to actiivty and pain.    Pertinent Vitals/ Pain       Pain Assessment: Faces Faces  Pain Scale: Hurts whole lot Pain Location: buttocks and back Pain Descriptors / Indicators: Grimacing;Guarding (HR elevating) Pain Intervention(s): Monitored during session;Limited activity within patient's tolerance;Repositioned  Home Living                                          Prior Functioning/Environment              Frequency  Min 2X/week        Progress Toward Goals  OT Goals(current goals can now be found in the care plan section)  Progress towards OT goals: Progressing toward goals  Acute Rehab OT Goals Patient Stated Goal: to be able to return home with his Mom and do what he did before OT Goal Formulation: With patient Time For Goal Achievement: 07/16/20 Potential to Achieve Goals: Good ADL Goals Pt Will Perform Grooming: with set-up;sitting Pt Will Perform Upper Body Dressing: with set-up;sitting Pt/caregiver will Perform Home Exercise Program: Increased strength;Both right and left upper extremity;With Supervision;With theraband Additional ADL Goal #1: Pt will increase to minA +2 for bed mobility to increase independence for OOB ADL. Additional ADL Goal #2: Pt will tolerate x5 mins of unsupported sitting for dynamic sitting balance tasks with O2 >90%. Additional ADL Goal #3: Pt will tolerate standing @ 60 degrees x 5 min in preparation for ADL tasks and mobility  Plan Frequency remains appropriate;Discharge plan remains appropriate    Co-evaluation                 AM-PAC OT "6 Clicks" Daily Activity     Outcome Measure   Help from another person eating meals?: A Little Help from another person taking care of personal grooming?: A Little Help from another person toileting, which includes using toliet, bedpan, or urinal?: Total Help from another person bathing (including washing, rinsing, drying)?: A Lot Help from another person to put on and taking off regular upper body clothing?: A Lot Help from another person to put on and  taking off regular lower body clothing?: Total 6 Click Score: 12    End of Session Equipment Utilized During Treatment: Oxygen (5L FiO2 28%)  OT Visit Diagnosis: Unsteadiness on feet (R26.81);Muscle weakness (generalized) (M62.81);Pain Pain - part of body: Shoulder;Arm;Hip;Knee;Leg   Activity Tolerance Patient limited by pain   Patient Left in bed;with call bell/phone within reach   Nurse Communication Mobility status        Time: 1610-9604 OT Time Calculation (min): 63 min  Charges: OT General Charges $OT Visit: 1 Visit OT  Treatments $Self Care/Home Management : 8-22 mins $Therapeutic Activity: 23-37 mins $Therapeutic Exercise: 8-22 mins  Hesper Venturella MSOT, OTR/L Acute Rehab Pager: [phone] Office: [phone]   Theodoro Grist Labrina Lines 07/16/2020, 10:22 AM

## 2020-07-16 NOTE — Procedures (Signed)
Tracheostomy Change Note  Patient Details:   Name: Carl Hill DOB: 1970/06/22 MRN: [id number]    Airway Documentation:     Evaluation  O2 sats: stable throughout Complications: No apparent complications Patient did tolerate procedure well. Bilateral Breath Sounds: Clear,Diminished   Trach was changed to a #6 cuffless [place]iley without any complications. Positive color change noted on CO2 detector. Janina Mayo was secured with trach ties.     Giovanny Dugal, Margaretmary Dys 07/16/2020, 2:10 PM

## 2020-07-16 NOTE — Progress Notes (Signed)
PROGRESS NOTE    Carl Hill  GYJ:[id number] DOB: 1971/01/16 DOA: 06/10/2020 PCP: Malka So., MD   Chief Complaint  Patient presents with  . Hyperglycemia    Brief Narrative:  50 year old male with piror h/o HTN, obesity, DM, AFib on DOAC presents with buttock pain, malaise. CT pelvis showed demonstrated necrotizing fasciitis in L buttock, L upper thigh, and perineum. Taken to OR for debridement 1/10 c/b Afib with RVR and VT arrest s/p CPR/defib. Trached 1/26,.  Currently on trach collar. He was transferred to progressive care to [facility] on 07/10/20.  Toc on board for placement.  PCCM to downsize to 6 cuffless today. And does not plan to decannulation until he shows more mobility or loses weight. Pt had intermittent blurry vision, CT head without contrast does not show any acute intracranial abnormality.  He will need ophthalmology evaluation on discharge.  Pt seen and examined at bedside. No new complaints.   Assessment & Plan:   Principal Problem:   Fournier gangrene Active Problems:   Sepsis (HCC)   DKA (diabetic ketoacidosis) (HCC)   AKI (acute kidney injury) (HCC)   Diabetic ulcer of heel (HCC)   Atrial fibrillation, chronic (HCC)   Essential hypertension   Dyslipidemia   Acquired hypothyroidism   Chronic pain disorder   Morbid obesity with BMI of 60.0-69.9, adult (HCC)   Acute respiratory failure (HCC)   Sepsis due to Streptococcus, group B (HCC)   Actinomycosis  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 failure in the setting of sepsis, health care associated pneumonia, recurrent mucus plugging:   Failed extubation.  S/p trach , continue with trach collar on 28% oxygen to keep sats greater than 90%. pccm plan to change to 6 cuffless today around 10 am.   SLP PMV on board.  - PT eval recommending SNF.  - pt remains on 5lit of oxygen .  - frequent suctioning and pulmonary toilet.  - PCCM does not plan  decannulation until he shows more mobility or  loses weight.    Left buttock necrotizing fascitis with E coli , Group B strep, peptostreptococcus in wound culture Hydrotherapy 06/29/20 till 07/04/20 No further debridement. General surgery recommended would care to follow up.  On augmentin for 6 months as per PCCM.  Family is requesting for repeat wound care consult which has been placed.    Hypernatremia from diuresis and insensible losses:  Resolved. Repeat labs in am.    Diabetes mellitus:  CBG (last 3)  Recent Labs    07/15/20 1719 07/15/20 2122 07/16/20 0614  GLUCAP 156* 178* 150*   SLP eval recommending regular diet with thin liquid.  Currently on 42 units BID, Increased novolog to 18 units TIDAC.  CBG'S are well controlled. No change in meds.    Acute blood loss anemia; from blood loss from wound.  S/p PRBC transfusion. H&H stable around 8.  Transfuse  to keep hemoglobin greater than 7. Repeat labs in am.    Hypothyroidism:  Continue with synthroid.    Atrial fibrillation:  Paroxysmal.  Rate controlled with amiodarone 200 mg daily.   Therapeutic INR.  pharmacy to dose coumadin.   Pressure injury present on admission:  Pressure Injury 06/10/20 Heel Right Stage 2 -  Partial thickness loss of dermis presenting as a shallow open injury with a red, pink wound bed without slough. (Active)  06/10/20 1400  Location: Heel  Location Orientation: Right  Staging: Stage 2 -  Partial thickness loss of dermis presenting  as a shallow open injury with a red, pink wound bed without slough.  Wound Description (Comments):   Present on Admission: Yes   Wound care reconsulted .    Hyperlipidemia:  Resume lipitor.    Intermittent blurry vision, worsens with taking oxycodone , also happens at home, CT head without contrast does not show any intracranial abnormality.  Pt will need ophthalmology evaluation when discharged.  If persistent , will need MRI brain for further evaluation,       DVT prophylaxis:  (Warfarin) Code Status: (Full Code) Family Communication: none at bedside.   Disposition:   Status is: Inpatient  Remains inpatient appropriate because:Unsafe d/c plan and Inpatient level of care appropriate due to severity of illness   Dispo: The patient is from: Home              Anticipated d/c is to: SNF              Anticipated d/c date is: > 3 days              Patient currently is not medically stable to d/c.   Difficult to place patient Yes  Level of care: Progressive   Consultants:   General surgery.   ID  PCCM.    Procedures:  ETT 1/10 >>  R IJ CVL 1/10 >>  L brachial A-line 1/10 >>  RECTAL TUBE.  S/P TRACH  ON 1/26 6 CUFFLESS ON 2/15   Antimicrobials: Aztreonam 1/09 Metronidazole 1/09 Vancomycin 1/09, 1/11, 1/20 >>1/22 Cefepime 1/10 >> 1/13, 1/20 >>1/22 Clindamycin 1/10 >> 1/13 Ceftriaxone 1/13 >> 1/20 Metronidazole 1/17 >> 1/22 Meropenem 1/22 >> 1/25 Amoxicillin 1/24 (x 1 for PCN Allergy challenge) Unasyn 1/25 >> 1/27 Augmentin 1/27 >>  Significant Hospital Events:  1/10  Admitted to [facility]. General surgery consulted and debridement pursued. In the afternoon, patient developed hypotension with AF in RVR. Synchronized cardioversion unsuccessful with conversion to VT. CPR initated with ROSC after desynchronized defibrillation. 1/11 Debridement 1/18 Mucus plugging 1/19 Bronchoscopy >> mucus plug on Rt, airway collapse with exhalation 1/20 Persistent fever, change ABx 1/24 Increased vent requirements, habitus related +/- mucus plugging 1/25 Hypotensive to SBP 80s, Levo resumed, minimally responsive overnight off sedation, CT Head negative 1/26 Stable vent/pressor requirements, bronched, tracheostomy 1/29 start hydrotherapy 1/30 start lovenox/coumadin 2/2 Tolerating PS 2/4 did TC for 12 hours 2/5 Did 24 hours TC 2/6 Continue TC  2/7 Try dilaudid with turns/dressing change for better pain control - longer acting, fentanyl IV d/c'd 2/8 Increase  standing oxy, dilaudid PRN for turns/dressing changes, downgrade to progressive 2/15 change to 6 cuffless by pCCM.     Subjective: No chest pain or sob, no nausea, vomiting or abd pain.   Objective: Vitals:   07/16/20 0204 07/16/20 0340 07/16/20 0348 07/16/20 0900  BP:   (!) 99/52 (!) 112/51  Pulse: (!) 101 (!) 102 99 (!) 124  Resp: 17 14 15 18   Temp:   98.4 F (36.9 C)   TempSrc:   Oral   SpO2: 96%  96% 98%  Weight:      Height:        Intake/Output Summary (Last 24 hours) at 07/16/2020 0919 Last data filed at 07/16/2020 0800 Gross per 24 hour  Intake 1450 ml  Output 7850 ml  Net -6400 ml   Filed Weights   07/14/20 0332 07/15/20 0451 07/16/20 0023  Weight: (!) 244.2 kg (!) 244.8 kg (!) 245.5 kg    Examination:  General exam: Morbidly obese  patient on trach collar on 5 L of oxygen Respiratory system: Air entry fair bilateral, no wheezing heard, no tachypnea cardiovascular system: S1-S2 heard, tachycardic, irregular, pedal edema present Gastrointestinal system: Abdomen is soft, nontender bowel sounds normal Central nervous system: Alert and oriented, grossly nonfocal  extremities: Pedal edema present Skin: Pressure injury on the left heel psychiatry: Mood is appropriate   Data Reviewed: I have personally reviewed following labs and imaging studies  CBC: Recent Labs  Lab 07/10/20 0333 07/11/20 0246 07/13/20 0421 07/14/20 0628  WBC 9.7 10.2 8.1 7.1  HGB 8.8* 8.7* 8.5* 8.6*  HCT 29.9* 27.8* 26.8* 29.5*  MCV 93.7 90.3 88.4 92.2  PLT 282 263 229 222    Basic Metabolic Panel: Recent Labs  Lab 07/10/20 0333 07/11/20 0246 07/13/20 0421  NA 137 133* 133*  K 4.1 4.2 3.7  CL 99 96* 94*  CO2 27 26 29   GLUCOSE 183* 199* 199*  BUN 31* 25* 10  CREATININE 1.04 0.99 1.00  CALCIUM 8.6* 8.3* 8.5*    GFR: Estimated Creatinine Clearance: 181.4 mL/min (by C-G formula based on SCr of 1 mg/dL).  Liver Function Tests: No results for input(s): AST, ALT, ALKPHOS,  BILITOT, PROT, ALBUMIN in the last 168 hours.  CBG: Recent Labs  Lab 07/15/20 0609 07/15/20 1153 07/15/20 1719 07/15/20 2122 07/16/20 0614  GLUCAP 157* 191* 156* 178* 150*     No results found for this or any previous visit (from the past 240 hour(s)).       Radiology Studies: CT HEAD WO CONTRAST  Result Date: 07/15/2020 CLINICAL DATA:  Dizziness, nonspecific, blurry vision. EXAM: CT HEAD WITHOUT CONTRAST TECHNIQUE: Contiguous axial images were obtained from the base of the skull through the vertex without intravenous contrast. COMPARISON:  CT head 06/25/20 FINDINGS: Brain: No evidence of large-territorial acute infarction. No parenchymal hemorrhage. No mass lesion. No extra-axial collection. No mass effect or midline shift. No hydrocephalus. Basilar cisterns are patent. Vascular: No hyperdense vessel. Skull: No acute fracture or focal lesion. Sinuses/Orbits: Left sphenoid sinus mucosal thickening. Redemonstration of bilateral mastoid air cell opacification. Otherwise the remaining paranasal sinuses are clear. No fluid noted within the middle ear. The orbits are unremarkable. Other: None. IMPRESSION: No acute intracranial abnormality. Electronically Signed   By: Tish FredericksonMorgane  Naveau M.D.   On: 07/15/2020 23:09        Scheduled Meds: . amiodarone  200 mg Oral Daily  . amoxicillin-clavulanate  1 tablet Oral Q8H  . atorvastatin  20 mg Oral QPM  . chlorhexidine  15 mL Mouth Rinse BID  . Chlorhexidine Gluconate Cloth  6 each Topical Daily  . collagenase  1 application Topical Daily  . feeding supplement  237 mL Oral BID BM  . insulin aspart  0-20 Units Subcutaneous TID WC  . insulin aspart  0-5 Units Subcutaneous QHS  . insulin aspart  18 Units Subcutaneous TID WC  . insulin detemir  42 Units Subcutaneous BID  . levothyroxine  75 mcg Oral Daily  . mouth rinse  15 mL Mouth Rinse q12n4p  . multivitamin with minerals  1 tablet Oral Daily  . nortriptyline  25 mg Oral BID  . oxyCODONE   15 mg Oral Q6H  . pantoprazole  40 mg Oral Daily  . pregabalin  300 mg Oral BID  . Ensure Max Protein  11 oz Oral QHS  . senna-docusate  2 tablet Oral BID  . sodium chloride flush  10-40 mL Intracatheter Q12H  . sodium chloride flush  3  mL Intravenous Q12H  . Warfarin - Pharmacist Dosing Inpatient   Does not apply q1600   Continuous Infusions: . sodium chloride Stopped (06/28/20 2035)     LOS: 36 days        Kathlen Mody, MD [facility]   To contact the attending provider between 7A-7P or the covering provider during after hours 7P-7A, please log into the web site [email or web address] and access using universal [place] password for that web site. If you do not have the password, please call the hospital operator.  07/16/2020, 9:19 AM

## 2020-07-16 NOTE — Progress Notes (Signed)
ANTICOAGULATION CONSULT NOTE  Pharmacy Consult for Warfarin  Indication: atrial fibrillation  Patient Measurements: Height: 5\' 10"  (177.8 cm) Weight: (!) 245.5 kg (541 lb 3.7 oz) IBW/kg (Calculated) : 73 Heparin Dosing Weight: 149.7 kg  Vital Signs: Temp: 98.4 F (36.9 C) (02/15 0348) Temp Source: Oral (02/15 0348) BP: 112/51 (02/15 0900) Pulse Rate: 124 (02/15 0900)  Labs: Recent Labs    07/14/20 0628 07/15/20 0300 07/16/20 0233  HGB 8.6*  --   --   HCT 29.5*  --   --   PLT 222  --   --   LABPROT 33.7* 28.3* 25.5*  INR 3.5* 2.8* 2.4*    Estimated Creatinine Clearance: 181.4 mL/min (by C-G formula based on SCr of 1 mg/dL).   Assessment: 49YOM admitted with septic shock secondary to necrotizing fascitis. Was on Xarelto PTA, but that is not appropriate based on patient's weight. Pharmacy has been consulted to dose warfarin.  INR therapeutic at 2.4. Warfarin held 2/4-2/7 & 2/10-2/13. Cautious dosing given previously supratherapeutic INRs and potential drug-drug interaction with amiodarone and Augmentin. Patient s/p Cortrak now eating 60-100% of meals. Hgb stable at 8.6 and pltc is stable within normal range. No bleeding noted.    Goal of Therapy:  INR 2-3 Monitor platelets by anticoagulation protocol: Yes  Plan:  Warfarin 6 mg PO tonight Daily INR and CBC Monitor for s/sx of bleeding  Thank you for involving pharmacy in this patient's care.  07-20-1970, PharmD, BCPS Clinical Pharmacist Clinical phone for 07/16/2020 until 3p is [phone] 07/16/2020 10:16 AM  **Pharmacist phone directory can be found on [email or web address] listed under [facility]**

## 2020-07-17 DIAGNOSIS — N493 Fournier gangrene: Secondary | ICD-10-CM | POA: Diagnosis not present

## 2020-07-17 LAB — CBC
HCT: 30.2 % — ABNORMAL LOW (ref 39.0–52.0)
Hemoglobin: 9.1 g/dL — ABNORMAL LOW (ref 13.0–17.0)
MCH: 26.4 pg (ref 26.0–34.0)
MCHC: 30.1 g/dL (ref 30.0–36.0)
MCV: 87.5 fL (ref 80.0–100.0)
Platelets: 208 10*3/uL (ref 150–400)
RBC: 3.45 MIL/uL — ABNORMAL LOW (ref 4.22–5.81)
RDW: 15.5 % (ref 11.5–15.5)
WBC: 6.9 10*3/uL (ref 4.0–10.5)
nRBC: 0.3 % — ABNORMAL HIGH (ref 0.0–0.2)

## 2020-07-17 LAB — GLUCOSE, CAPILLARY
Glucose-Capillary: 156 mg/dL — ABNORMAL HIGH (ref 70–99)
Glucose-Capillary: 175 mg/dL — ABNORMAL HIGH (ref 70–99)
Glucose-Capillary: 213 mg/dL — ABNORMAL HIGH (ref 70–99)
Glucose-Capillary: 131 mg/dL — ABNORMAL HIGH (ref 70–99)

## 2020-07-17 LAB — BASIC METABOLIC PANEL
Anion gap: 11 (ref 5–15)
BUN: 11 mg/dL (ref 6–20)
CO2: 30 mmol/L (ref 22–32)
Calcium: 8.7 mg/dL — ABNORMAL LOW (ref 8.9–10.3)
Chloride: 93 mmol/L — ABNORMAL LOW (ref 98–111)
Creatinine, Ser: 0.91 mg/dL (ref 0.61–1.24)
GFR, Estimated: >60 mL/min (ref >60)
Glucose, Bld: 151 mg/dL — ABNORMAL HIGH (ref 70–99)
Potassium: 3.6 mmol/L (ref 3.5–5.1)
Sodium: 134 mmol/L — ABNORMAL LOW (ref 135–145)

## 2020-07-17 LAB — PROTIME-INR
INR: 2.5 — ABNORMAL HIGH (ref 0.8–1.2)
Prothrombin Time: 26.3 seconds — ABNORMAL HIGH (ref 11.4–15.2)

## 2020-07-17 MED ORDER — WARFARIN SODIUM 3 MG PO TABS
6.0000 mg | ORAL_TABLET | Freq: Once | ORAL | Status: AC
  Administered 2020-07-17: 6 mg via ORAL
  Filled 2020-07-17: qty 2

## 2020-07-17 MED ORDER — OXYCODONE HCL 5 MG/5ML PO SOLN
15.0000 mg | Freq: Four times a day (QID) | ORAL | Status: DC | PRN
  Administered 2020-07-17 – 2020-07-30 (×37): 15 mg via ORAL
  Filled 2020-07-17 (×37): qty 15

## 2020-07-17 NOTE — Progress Notes (Signed)
ANTICOAGULATION CONSULT NOTE  Pharmacy Consult for Warfarin  Indication: atrial fibrillation  Patient Measurements: Height: 5\' 10"  (177.8 cm) Weight: (!) 245.5 kg (541 lb 3.7 oz) IBW/kg (Calculated) : 73 Heparin Dosing Weight: 149.7 kg  Vital Signs: Temp: 98.2 F (36.8 C) (02/16 0744) Temp Source: Oral (02/16 0744) BP: 130/68 (02/16 0405) Pulse Rate: 96 (02/16 0405)  Labs: Recent Labs    07/15/20 0300 07/16/20 0233 07/17/20 0342  HGB  --   --  9.1*  HCT  --   --  30.2*  PLT  --   --  208  LABPROT 28.3* 25.5* 26.3*  INR 2.8* 2.4* 2.5*  CREATININE  --   --  0.91    Estimated Creatinine Clearance: 199.4 mL/min (by C-G formula based on SCr of 0.91 mg/dL).   Assessment: 49YOM admitted with septic shock secondary to necrotizing fascitis. Was on Xarelto PTA, but that is not appropriate based on patient's weight. Pharmacy has been consulted to dose warfarin.  INR therapeutic at 2.5. Warfarin held 2/4-2/7 & 2/10-2/13. Cautious dosing given previously supratherapeutic INRs and potential drug-drug interaction with amiodarone and Augmentin. Patient s/p Cortrak now eating 100% of meals. Hgb stable at 9.1 and pltc is stable within normal range. No bleeding noted.    Goal of Therapy:  INR 2-3 Monitor platelets by anticoagulation protocol: Yes  Plan:  Warfarin 6 mg PO tonight Daily INR and CBC Monitor for s/sx of bleeding  Thank you for involving pharmacy in this patient's care.  07-20-1971, PharmD, BCCCP Clinical Pharmacist  Phone: [phone] 07/17/2020 8:57 AM  Please check AMION for all [facility] Pharmacy phone numbers After 10:00 PM, call Main Pharmacy [phone]

## 2020-07-17 NOTE — Progress Notes (Signed)
Perineum wound measuring at 22cm x 17cm.

## 2020-07-17 NOTE — Progress Notes (Signed)
Physical Therapy Treatment Patient Details Name: Carl Hill MRN: [id number] DOB: [birth year] Today's Date: 07/17/2020    History of Present Illness Pt is a 50 year old male who presented with buttock pain, fatigue and malaise.  Found to have hyperglycemia. CT pelvis demonstrated necrotizing fasciitis in L buttock, L upper thigh, and perineum. Taken to OR for debridement 1/10 c/b Afib with RVR and VT arrest s/p CPR/defib. Trached 1/26, remains on full vent support. PMHx: HTN, obesity, DM, AFib.  As of 07/04/20 started TC trials.    PT Comments    Pt tolerated highest tilt yet at 60 degrees today.  He was also able to roll with supervision in the bed (needed two person assist to get positioned comfortably, but could turn to his side with only the bed rail.  BPs in standing were better until we got to our highest level (at highest level every session bp tends to trend down until we have repeated several times).  BPs 90/70s on final tilt, but taken at 50 degrees and 130s/70s.  PT keeps pt exercising and chatting during each increase in tilt to help encourage blood return to body/head.  Pt is eager to try sitting EOB next session with PT and [employer] bed rep who has been helping with bed functionality during our sessions.  I am so encouraged by pt's progress that I have reached out to CIR to see if he would be a candidate for admission there.  PT will continue to follow acutely for safe mobility progression.   Follow Up Recommendations  CIR;Other (comment) (would like to see if he has CIR potential.  I think he does now.)     Equipment Recommendations  Hospital bed;Other (comment) (hoyer lift, air mattress, all bariatirc equipment.)    Recommendations for Other Services       Precautions / Restrictions Precautions Precautions: Fall;Other (comment) Precaution Comments: TC (cuffless now), rectal tube, large buttocks wound, hydro on hold; PMSV    Mobility  Bed Mobility Overal bed mobility: Needs  Assistance Bed Mobility: Rolling Rolling: Supervision         General bed mobility comments: Pt able to roll once bed extended on side he was rolling towards with supervision, needed two person mod assist to get scooted back so he was not leaning completely on the railing.  Reminders/cues to pt to flex knees and use legs with all bed mobility (even scooting to [facility] which he immediately uses arms, not thinking to push with both legs as well). Start Time: 0900 Angle: 60 degrees Total Minutes in Angle: 5 minutes (over 20 mins tilting total) Patient Response:  (doing well self regulating breathing and anxiety, at highest tilt lightheaded with BP 90s/70s)  Transfers                    Ambulation/Gait                 Stairs             Wheelchair Mobility    Modified Rankin (Stroke Patients Only)       Balance                                            Cognition Arousal/Alertness: Awake/alert Behavior During Therapy: Anxious Overall Cognitive Status: Within Functional Limits for tasks assessed  General Comments: Pt doing better with anxiety, able to self regulate, I think therapist rapport and consistency of schedule helps.      Exercises General Exercises - Upper Extremity Shoulder Flexion: AROM;Both;20 reps;Theraband Theraband Level (Shoulder Flexion): Level 2 (Red) Shoulder Horizontal ABduction: AROM;Both;20 reps;Theraband Theraband Level (Shoulder Horizontal Abduction): Level 2 (Red) Elbow Flexion: AROM;Both;Theraband;20 reps Theraband Level (Elbow Flexion): Level 2 (Red) General Exercises - Lower Extremity Quad Sets: AROM;Both;20 reps;Standing Toe Raises: AROM;Both;10 reps;Standing Heel Raises: AROM;Both;10 reps;Standing;Other (comment) (semi standing (during tlt, does best before straps are engaged at ~12 degrees) Other Exercises Other Exercises: head and shoulder lifts, reaching  out with bil UE to engage trunk/abdominal muscles x10.    General Comments General comments (skin integrity, edema, etc.): VSS on new cuffless trach at lowest 28% Fio@ and 5L lowest O2 reading was 88%, however, poor wave.  BPs generally better today, however, still symptomatic in highest tilt at 60 degrees and BPs 90s/70s.      Pertinent Vitals/Pain Pain Assessment: Faces Faces Pain Scale: Hurts even more Pain Location: buttocks and back Pain Descriptors / Indicators: Grimacing;Guarding Pain Intervention(s): Limited activity within patient's tolerance;Monitored during session;Premedicated before session;Repositioned    Home Living                      Prior Function            PT Goals (current goals can now be found in the care plan section) Acute Rehab PT Goals Patient Stated Goal: to be able to return home with his Mom and do what he did before Progress towards PT goals: Progressing toward goals    Frequency    Min 3X/week (split with OT)      PT Plan Current plan remains appropriate    Co-evaluation              AM-PAC PT "6 Clicks" Mobility   Outcome Measure  Help needed turning from your back to your side while in a flat bed without using bedrails?: A Little Help needed moving from lying on your back to sitting on the side of a flat bed without using bedrails?: Total Help needed moving to and from a bed to a chair (including a wheelchair)?: Total Help needed standing up from a chair using your arms (e.g., wheelchair or bedside chair)?: Total Help needed to walk in hospital room?: Total Help needed climbing 3-5 steps with a railing? : Total 6 Click Score: 8    End of Session Equipment Utilized During Treatment: Oxygen Activity Tolerance: Patient limited by pain Patient left: in bed;with call bell/phone within reach   PT Visit Diagnosis: Muscle weakness (generalized) (M62.81);Other abnormalities of gait and mobility (R26.89)     Time:  0973-5329 PT Time Calculation (min) (ACUTE ONLY): 59 min  Charges:  $Therapeutic Exercise: 23-37 mins $Therapeutic Activity: 23-37 mins                     Corinna Capra, PT, DPT  Acute Rehabilitation [phone] pager [phone]) [phone] office

## 2020-07-17 NOTE — Progress Notes (Signed)
PROGRESS NOTE    Carl Hill  LPF:[id number] DOB: 07/16/1970 DOA: 06/10/2020 PCP: Malka So., MD   Chief Complaint  Patient presents with  . Hyperglycemia    Brief Narrative:  50 year old male with piror h/o HTN, obesity, DM, AFib on DOAC presented with buttock pain, malaise. CT pelvis showed demonstrated necrotizing fasciitis in L buttock, L upper thigh, and perineum. Taken to OR for debridement 1/10, complicated by Afib with RVR and VT arrest s/p CPR/defib.  Prolonged mechanical ventilation, eventually trached on 1/26,.  Currently on trach collar. He was transferred to progressive care to [facility] on 07/10/20.  -Trach was downgraded to #6 cuffless Shiley on 2/15, per  Pulm no plans for decannulation until has increased mobility or weight loss.  Significant Hospital Events:  1/10  Admitted to [facility] with sepsis, necrotizing fasciitis. General surgery consulted and debridement pursued. In the afternoon, patient developed hypotension with AF in RVR. Synchronized cardioversion unsuccessful with conversion to VT. CPR initated with ROSC after desynchronized defibrillation. 1/11 Debridement 1/18 Mucus plugging 1/19 Bronchoscopy >> mucus plug on Rt, airway collapse with exhalation 1/20 Persistent fever, change ABx 1/24 Increased vent requirements, habitus related +/- mucus plugging 1/25 Hypotensive to SBP 80s, Levo resumed, minimally responsive overnight off sedation, CT Head negative 1/26 Stable vent/pressor requirements, bronched, tracheostomy 1/29 start hydrotherapy 1/30 start lovenox/coumadin 2/2 Tolerating PS 2/4 did TC for 12 hours 2/5 Did 24 hours TC 2/6 Continue TC  2/7 Try dilaudid with turns/dressing change for better pain control - longer acting, fentanyl IV d/c'd 2/8 Increase standing oxy, dilaudid PRN for turns/dressing changes, downgrade to progressive 2/15 changed to 6 cuffless by pCCM.   Assessment & Plan:    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  failure in the setting of sepsis, morbid obesity, health care associated pneumonia, recurrent mucus plugging:  -Prolonged respiratory failure requiring mechanical ventilation, failed attempts at extubation  -Status post tracheostomy on 1/26, weaned off ventilator -Remains stable on trach collar, PCCM following peripherally -Trach was downgraded to cuffless #6 Shiley on 2/15 -SLP following -Continue pulm  toilet -Per pulmonary no plans for decannulation at this time  Left buttock necrotizing fascitis with E coli , Group B strep, peptostreptococcus in wound culture Hydrotherapy 06/29/20 till 07/04/20 No further debridement. General surgery recommended wound care to follow up.  -Started on a 7-month course of Augmentin per CCM, will review wounds and assess for short-term course -Continue dressing changes  Hypernatremia from diuresis and insensible losses:  Resolved. Repeat labs in am.   Type 2 diabetes mellitus  SLP eval recommending regular diet with thin liquid.  Currently on 42 units BID, Increased novolog to 18 units TIDAC.  CBG'S are well controlled. No change in meds.   Acute blood loss anemia; from blood loss from wound.  S/p PRBC transfusion. H&H stable around 8.  Transfuse  to keep hemoglobin greater than 7. Repeat labs in am.   Hypothyroidism:  Continue with synthroid.   Atrial fibrillation:  Paroxysmal.  Rate controlled with amiodarone 200 mg daily.   Therapeutic INR.  pharmacy to dose coumadin.   Pressure injury present on admission:  Pressure Injury 06/10/20 Heel Right Stage 2 -  Partial thickness loss of dermis presenting as a shallow open injury with a red, pink wound bed without slough. (Active)  06/10/20 1400  Location: Heel  Location Orientation: Right  Staging: Stage 2 -  Partial thickness loss of dermis presenting as a shallow open injury with a red, pink wound  bed without slough.  Wound Description (Comments):   Present on Admission: Yes   Wound care  reconsulted .    Hyperlipidemia:  Resume lipitor.    Intermittent blurry vision, could be related to hypotension, worsens with taking oxycodone , also happens at home, CT head without contrast does not show any intracranial abnormality.  Pt will need ophthalmology evaluation when discharged.   DVT prophylaxis: (Warfarin) Code Status: (Full Code) Family Communication: none at bedside.   Disposition:   Status is: Inpatient  Remains inpatient appropriate because:Unsafe d/c plan and Inpatient level of care appropriate due to severity of illness   Dispo: The patient is from: Home              Anticipated d/c is to: SNF              Anticipated d/c date is: > 3 days              Patient currently is not medically stable to d/c.   Difficult to place patient Yes  Level of care: Progressive   Consultants:   General surgery.   ID  PCCM.    Procedures:  ETT 1/10 >>  R IJ CVL 1/10 >>  L brachial A-line 1/10 >>  RECTAL TUBE.  S/P TRACH  ON 1/26 6 CUFFLESS ON 2/15   Antimicrobials: Aztreonam 1/09 Metronidazole 1/09 Vancomycin 1/09, 1/11, 1/20 >>1/22 Cefepime 1/10 >> 1/13, 1/20 >>1/22 Clindamycin 1/10 >> 1/13 Ceftriaxone 1/13 >> 1/20 Metronidazole 1/17 >> 1/22 Meropenem 1/22 >> 1/25 Amoxicillin 1/24 (x 1 for PCN Allergy challenge) Unasyn 1/25 >> 1/27 Augmentin 1/27 >>  Subjective: -Feels okay, still halfway up with steady  Objective: Vitals:   07/17/20 0744 07/17/20 0834 07/17/20 1138 07/17/20 1242  BP:   (!) 118/57   Pulse:  (!) 107 (!) 106   Resp:  20 18   Temp: 98.2 F (36.8 C)  98.2 F (36.8 C)   TempSrc: Oral  Oral   SpO2:  99% 96% 96%  Weight:      Height:        Intake/Output Summary (Last 24 hours) at 07/17/2020 1341 Last data filed at 07/17/2020 0813 Gross per 24 hour  Intake 2440 ml  Output 6050 ml  Net -3610 ml   Filed Weights   07/15/20 0451 07/16/20 0023 07/17/20 0405  Weight: (!) 244.8 kg (!) 245.5 kg (!) 245.5 kg     Examination:  General exam: Morbidly obese chronically ill patient laying in bed, no distress HEENT: Tracheostomy with trach collar CVS: S1-S2, irregularly irregular rhythm Lungs: Distant breath sounds, decreased at the bases Abdomen: Obese, soft, nontender, bowel sounds present Extremities: Dry scaly skin skin of lower extremities with venous stasis changes Skin, bilateral sacral decubitus wounds with dressing  Data Reviewed: I have personally reviewed following labs and imaging studies  CBC: Recent Labs  Lab 07/11/20 0246 07/13/20 0421 07/14/20 0628 07/17/20 0342  WBC 10.2 8.1 7.1 6.9  HGB 8.7* 8.5* 8.6* 9.1*  HCT 27.8* 26.8* 29.5* 30.2*  MCV 90.3 88.4 92.2 87.5  PLT 263 229 222 208    Basic Metabolic Panel: Recent Labs  Lab 07/11/20 0246 07/13/20 0421 07/17/20 0342  NA 133* 133* 134*  K 4.2 3.7 3.6  CL 96* 94* 93*  CO2 26 29 30   GLUCOSE 199* 199* 151*  BUN 25* 10 11  CREATININE 0.99 1.00 0.91  CALCIUM 8.3* 8.5* 8.7*    GFR: Estimated Creatinine Clearance: 199.4 mL/min (by C-G formula based  on SCr of 0.91 mg/dL).  Liver Function Tests: No results for input(s): AST, ALT, ALKPHOS, BILITOT, PROT, ALBUMIN in the last 168 hours.  CBG: Recent Labs  Lab 07/16/20 1123 07/16/20 1628 07/16/20 2051 07/17/20 0536 07/17/20 1214  GLUCAP 225* 125* 137* 156* 175*     No results found for this or any previous visit (from the past 240 hour(s)).       Radiology Studies: CT HEAD WO CONTRAST  Result Date: 07/15/2020 CLINICAL DATA:  Dizziness, nonspecific, blurry vision. EXAM: CT HEAD WITHOUT CONTRAST TECHNIQUE: Contiguous axial images were obtained from the base of the skull through the vertex without intravenous contrast. COMPARISON:  CT head 06/25/20 FINDINGS: Brain: No evidence of large-territorial acute infarction. No parenchymal hemorrhage. No mass lesion. No extra-axial collection. No mass effect or midline shift. No hydrocephalus. Basilar cisterns are  patent. Vascular: No hyperdense vessel. Skull: No acute fracture or focal lesion. Sinuses/Orbits: Left sphenoid sinus mucosal thickening. Redemonstration of bilateral mastoid air cell opacification. Otherwise the remaining paranasal sinuses are clear. No fluid noted within the middle ear. The orbits are unremarkable. Other: None. IMPRESSION: No acute intracranial abnormality. Electronically Signed   By: Tish Frederickson M.D.   On: 07/15/2020 23:09        Scheduled Meds: . amiodarone  200 mg Oral Daily  . amoxicillin-clavulanate  1 tablet Oral Q8H  . atorvastatin  20 mg Oral QPM  . chlorhexidine  15 mL Mouth Rinse BID  . Chlorhexidine Gluconate Cloth  6 each Topical Daily  . collagenase  1 application Topical Daily  . feeding supplement  237 mL Oral BID BM  . insulin aspart  0-20 Units Subcutaneous TID WC  . insulin aspart  0-5 Units Subcutaneous QHS  . insulin aspart  18 Units Subcutaneous TID WC  . insulin detemir  42 Units Subcutaneous BID  . levothyroxine  75 mcg Oral Daily  . mouth rinse  15 mL Mouth Rinse q12n4p  . multivitamin with minerals  1 tablet Oral Daily  . nortriptyline  25 mg Oral BID  . oxyCODONE  15 mg Oral Q6H  . pantoprazole  40 mg Oral Daily  . pregabalin  300 mg Oral BID  . Ensure Max Protein  11 oz Oral QHS  . senna-docusate  2 tablet Oral BID  . sodium chloride flush  10-40 mL Intracatheter Q12H  . sodium chloride flush  3 mL Intravenous Q12H  . warfarin  6 mg Oral ONCE-1600  . Warfarin - Pharmacist Dosing Inpatient   Does not apply q1600   Continuous Infusions: . sodium chloride Stopped (06/28/20 2035)     LOS: 37 days   Zannie Cove, MD [facility]  07/17/2020, 1:41 PM

## 2020-07-17 NOTE — Plan of Care (Signed)
  Problem: Clinical Measurements: Goal: Ability to maintain clinical measurements within normal limits will improve Outcome: Progressing   Problem: Activity: Goal: Risk for activity intolerance will decrease Outcome: Progressing   

## 2020-07-17 NOTE — Progress Notes (Signed)
Inpatient Rehab Admissions Coordinator Note:   Per therapy updated recommendations, pt was screened for CIR candidacy by Estill Dooms, PT, DPT.  At this time feel pt meets criteria for medical necessity for CIR, but able to mobilize enough to show tolerance for up to 3 hr/day of therapy.  Will follow for 1-2 more therapy sessions for progress.  Please contact me with questions.   Estill Dooms, PT, DPT [phone] 07/17/20 1:06 PM

## 2020-07-17 NOTE — Progress Notes (Signed)
RT NOTE RT found patient's trach had partially came out. Patient says just worked with PT and was rolling side to side. RT was able to reinsert trach completley into place. RT confirmed placement with ETCO2 detector and positive color change. BBS heard. Vitals are stable. RT will continue to monitor.

## 2020-07-18 DIAGNOSIS — N493 Fournier gangrene: Secondary | ICD-10-CM | POA: Diagnosis not present

## 2020-07-18 LAB — CBC
HCT: 27 % — ABNORMAL LOW (ref 39.0–52.0)
Hemoglobin: 8.5 g/dL — ABNORMAL LOW (ref 13.0–17.0)
MCH: 27.5 pg (ref 26.0–34.0)
MCHC: 31.5 g/dL (ref 30.0–36.0)
MCV: 87.4 fL (ref 80.0–100.0)
Platelets: 211 10*3/uL (ref 150–400)
RBC: 3.09 MIL/uL — ABNORMAL LOW (ref 4.22–5.81)
RDW: 15.8 % — ABNORMAL HIGH (ref 11.5–15.5)
WBC: 7.6 10*3/uL (ref 4.0–10.5)
nRBC: 0.4 % — ABNORMAL HIGH (ref 0.0–0.2)

## 2020-07-18 LAB — BASIC METABOLIC PANEL
Anion gap: 11 (ref 5–15)
BUN: 11 mg/dL (ref 6–20)
CO2: 32 mmol/L (ref 22–32)
Calcium: 8.5 mg/dL — ABNORMAL LOW (ref 8.9–10.3)
Chloride: 92 mmol/L — ABNORMAL LOW (ref 98–111)
Creatinine, Ser: 1.01 mg/dL (ref 0.61–1.24)
GFR, Estimated: >60 mL/min (ref >60)
Glucose, Bld: 150 mg/dL — ABNORMAL HIGH (ref 70–99)
Potassium: 3.6 mmol/L (ref 3.5–5.1)
Sodium: 135 mmol/L (ref 135–145)

## 2020-07-18 LAB — GLUCOSE, CAPILLARY
Glucose-Capillary: 142 mg/dL — ABNORMAL HIGH (ref 70–99)
Glucose-Capillary: 212 mg/dL — ABNORMAL HIGH (ref 70–99)
Glucose-Capillary: 152 mg/dL — ABNORMAL HIGH (ref 70–99)
Glucose-Capillary: 142 mg/dL — ABNORMAL HIGH (ref 70–99)

## 2020-07-18 LAB — PROTIME-INR
INR: 2.8 — ABNORMAL HIGH (ref 0.8–1.2)
Prothrombin Time: 28.8 seconds — ABNORMAL HIGH (ref 11.4–15.2)

## 2020-07-18 MED ORDER — WARFARIN SODIUM 5 MG PO TABS
5.0000 mg | ORAL_TABLET | Freq: Once | ORAL | Status: AC
  Administered 2020-07-18: 5 mg via ORAL
  Filled 2020-07-18: qty 1

## 2020-07-18 NOTE — Progress Notes (Signed)
   07/18/20 1021  Assess: MEWS Score  Temp 98.3 F (36.8 C)  O2 Device Tracheostomy Collar  O2 Flow Rate (L/min) 5 L/min  FiO2 (%) 28 %  Assess: MEWS Score  MEWS Temp 0  MEWS Systolic 0  MEWS Pulse 2  MEWS RR 1  MEWS LOC 0  MEWS Score 3  MEWS Score Color Yellow  Assess: if the MEWS score is Yellow or Red  Were vital signs taken at a resting state? No  Focused Assessment No change from prior assessment  Early Detection of Sepsis Score *See Row Information* Low  MEWS guidelines implemented *See Row Information* No, other (Comment) (pt working with ot)  Treat  MEWS Interventions Other (Comment) (ASSESSED PT)  Pain Scale 0-10  Pain Score 0  Notify: Charge Nurse/RN  Name of Charge Nurse/RN Notified TAI,RN  Date Charge Nurse/RN Notified 07/18/20  Time Charge Nurse/RN Notified 1023

## 2020-07-18 NOTE — Progress Notes (Signed)
PROGRESS NOTE    Carl Hill  EQA:[id number] DOB: 02/01/1971 DOA: 06/10/2020 PCP: Malka So., MD   Chief Complaint  Patient presents with  . Hyperglycemia    Brief Narrative:  50 year old male with piror h/o HTN, obesity, DM, AFib on DOAC presented with buttock pain, malaise. CT pelvis showed demonstrated necrotizing fasciitis in L buttock, L upper thigh, and perineum. Taken to OR for debridement 1/10, complicated by Afib with RVR and VT arrest s/p CPR/defib.  Prolonged mechanical ventilation, eventually trached on 1/26,.  Currently on trach collar. He was transferred to progressive care to [facility] on 07/10/20.  -Trach was downgraded to #6 cuffless Shiley on 2/15, per  Pulm no plans for decannulation until has increased mobility or weight loss.  Significant Hospital Events:  1/10  Admitted to [facility] with sepsis, necrotizing fasciitis. General surgery consulted and debridement pursued. In the afternoon, patient developed hypotension with AF in RVR. Synchronized cardioversion unsuccessful with conversion to VT. CPR initated with ROSC after desynchronized defibrillation. 1/11 Debridement 1/18 Mucus plugging 1/19 Bronchoscopy >> mucus plug on Rt, airway collapse with exhalation 1/20 Persistent fever, change ABx 1/24 Increased vent requirements, habitus related +/- mucus plugging 1/25 Hypotensive to SBP 80s, Levo resumed, minimally responsive overnight off sedation, CT Head negative 1/26 Stable vent/pressor requirements, bronched, tracheostomy 1/29 start hydrotherapy 1/30 start lovenox/coumadin 2/2 Tolerating PS 2/4 did TC for 12 hours 2/5 Did 24 hours TC 2/6 Continue TC  2/7 Try dilaudid with turns/dressing change for better pain control - longer acting, fentanyl IV d/c'd 2/8 Increase standing oxy, dilaudid PRN for turns/dressing changes, downgrade to progressive 2/15 trach changed to # 6 cuffless   Assessment & Plan:    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  failure in the setting of sepsis, morbid obesity, health care associated pneumonia, recurrent mucus plugging:  -Prolonged respiratory failure requiring mechanical ventilation, failed attempts at extubation  -Status post tracheostomy on 1/26, weaned off ventilator -Remains stable on trach collar, PCCM following peripherally -Trach was downgraded to cuffless #6 Shiley on 2/15 -SLP following -Continue pulm  toilet -Per pulmonary no plans for decannulation at this time -Stable  Left buttock necrotizing fascitis with E coli , Group B strep, peptostreptococcus in wound culture -Excisional debridement of perianal, gluteal and perineal necrotizing soft tissue infection by Dr. Freida Busman 1/10 -Back to the OR 1/11 and excisional debridement of perineal necrosis, Dr.Allen -followed by hydrotherapy 06/29/20 till 07/04/20 No further debridement. General surgery recommended wound care to follow up.  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   Type 2 diabetes mellitus  SLP eval recommending regular diet with thin liquid.  Currently on 42 units BID, Increased novolog to 18 units TIDAC.  -Stable  Acute blood loss anemia; from blood loss from wound.  S/p PRBC transfusion. -Hemoglobin is relatively stable  Hypothyroidism:  Continue with synthroid.   Atrial fibrillation:  Paroxysmal.  Rate controlled with amiodarone 200 mg daily.   Therapeutic INR.  pharmacy to dose coumadin.   Pressure injury present on admission:  Pressure Injury 06/10/20 Heel Right Stage 2 -  Partial thickness loss of dermis presenting as a shallow open injury with a red, pink wound bed without slough. (Active)  06/10/20 1400  Location: Heel  Location Orientation: Right  Staging: Stage 2 -  Partial  thickness  loss of dermis presenting as a shallow open injury with a red, pink wound bed without slough.  Wound Description (Comments):   Present on Admission: Yes   Wound care reconsulted .    Hyperlipidemia:  Resume lipitor.    Intermittent blurry vision, could be related to hypotension, vs narcotics , also happens at home, CT head without contrast does not show any intracranial abnormality.  Pt will need ophthalmology evaluation when discharged.   DVT prophylaxis: (Warfarin) Code Status: (Full Code) Family Communication: none at bedside.   Disposition:   Status is: Inpatient  Remains inpatient appropriate because:Unsafe d/c plan and Inpatient level of care appropriate due to severity of illness   Dispo: The patient is from: Home              Anticipated d/c is to: SNF              Anticipated d/c date is: > 3 days              Patient currently is medically stable to d/c.   Difficult to place patient Yes  Level of care: Progressive   Consultants:   General surgery.   ID  PCCM.    Procedures:  ETT 1/10 >>  R IJ CVL 1/10 >>  L brachial A-line 1/10 >>  RECTAL TUBE.  S/P TRACH  ON 1/26 6 CUFFLESS ON 2/15   Antimicrobials: Aztreonam 1/09 Metronidazole 1/09 Vancomycin 1/09, 1/11, 1/20 >>1/22 Cefepime 1/10 >> 1/13, 1/20 >>1/22 Clindamycin 1/10 >> 1/13 Ceftriaxone 1/13 >> 1/20 Metronidazole 1/17 >> 1/22 Meropenem 1/22 >> 1/25 Amoxicillin 1/24 (x 1 for PCN Allergy challenge) Unasyn 1/25 >> 1/27 Augmentin 1/27 >>  Subjective: -Feels okay, no events overnight, requires pain meds with turning, wound care etc.  Objective: Vitals:   07/18/20 0957 07/18/20 1000 07/18/20 1021 07/18/20 1102  BP: 126/78   102/65  Pulse:    (!) 117  Resp:    20  Temp: 98.3 F (36.8 C) 98.3 F (36.8 C) 98.3 F (36.8 C) 97.8 F (36.6 C)  TempSrc: Oral Oral  Oral  SpO2:    93%  Weight:      Height:        Intake/Output Summary (Last 24 hours) at 07/18/2020 1401 Last data filed  at 07/18/2020 1314 Gross per 24 hour  Intake 2274 ml  Output 9525 ml  Net -7251 ml   Filed Weights   07/16/20 0023 07/17/20 0405 07/18/20 0044  Weight: (!) 245.5 kg (!) 245.5 kg (!) 247.1 kg    Examination:  General exam: Morbidly obese chronically ill male laying in bed, awake alert oriented x3, no distress HEENT: Tracheostomy with trach collar CVS: S1-S2, irregularly irregular rhythm Lungs: Distant breath sounds, decreased at bases Abdomen: Obese, soft, nontender, bowel sounds present Extremities:  Dry scaly skin skin of lower extremities with venous stasis changes Skin, bilateral sacral decubitus wounds with dressing  Data Reviewed: I have personally reviewed following labs and imaging studies  CBC: Recent Labs  Lab 07/13/20 0421 07/14/20 0628 07/17/20 0342 07/18/20 0259  WBC 8.1 7.1 6.9 7.6  HGB 8.5* 8.6* 9.1* 8.5*  HCT 26.8* 29.5* 30.2* 27.0*  MCV 88.4 92.2 87.5 87.4  PLT 229 222 208 211    Basic Metabolic Panel: Recent Labs  Lab 07/13/20 0421 07/17/20 0342 07/18/20 0259  NA 133* 134* 135  K 3.7 3.6 3.6  CL 94* 93* 92*  CO2 29 30 32  GLUCOSE 199* 151* 150*  BUN  10 11 11   CREATININE 1.00 0.91 1.01  CALCIUM 8.5* 8.7* 8.5*    GFR: Estimated Creatinine Clearance: 180.4 mL/min (by C-G formula based on SCr of 1.01 mg/dL).  Liver Function Tests: No results for input(s): AST, ALT, ALKPHOS, BILITOT, PROT, ALBUMIN in the last 168 hours.  CBG: Recent Labs  Lab 07/17/20 1214 07/17/20 1636 07/17/20 2110 07/18/20 0612 07/18/20 1103  GLUCAP 175* 213* 131* 142* 212*     No results found for this or any previous visit (from the past 240 hour(s)).   Scheduled Meds: . amiodarone  200 mg Oral Daily  . amoxicillin-clavulanate  1 tablet Oral Q8H  . atorvastatin  20 mg Oral QPM  . chlorhexidine  15 mL Mouth Rinse BID  . Chlorhexidine Gluconate Cloth  6 each Topical Daily  . collagenase  1 application Topical Daily  . feeding supplement  237 mL Oral BID BM   . insulin aspart  0-20 Units Subcutaneous TID WC  . insulin aspart  0-5 Units Subcutaneous QHS  . insulin aspart  18 Units Subcutaneous TID WC  . insulin detemir  42 Units Subcutaneous BID  . levothyroxine  75 mcg Oral Daily  . mouth rinse  15 mL Mouth Rinse q12n4p  . multivitamin with minerals  1 tablet Oral Daily  . nortriptyline  25 mg Oral BID  . pantoprazole  40 mg Oral Daily  . pregabalin  300 mg Oral BID  . Ensure Max Protein  11 oz Oral QHS  . senna-docusate  2 tablet Oral BID  . sodium chloride flush  10-40 mL Intracatheter Q12H  . sodium chloride flush  3 mL Intravenous Q12H  . warfarin  5 mg Oral ONCE-1600  . Warfarin - Pharmacist Dosing Inpatient   Does not apply q1600   Continuous Infusions: . sodium chloride Stopped (06/28/20 2035)     LOS: 38 days   2036, MD [facility]  07/18/2020, 2:01 PM

## 2020-07-18 NOTE — Progress Notes (Signed)
ANTICOAGULATION CONSULT NOTE  Pharmacy Consult for Warfarin  Indication: atrial fibrillation  Patient Measurements: Height: 5\' 10"  (177.8 cm) Weight: (!) 247.1 kg (544 lb 12.1 oz) IBW/kg (Calculated) : 73 Heparin Dosing Weight: 149.7 kg  Vital Signs: Temp: 98.2 F (36.8 C) (02/17 0757) Temp Source: Oral (02/17 0757) BP: 98/88 (02/17 0757) Pulse Rate: 101 (02/17 0757)  Labs: Recent Labs    07/16/20 0233 07/17/20 0342 07/18/20 0259  HGB  --  9.1* 8.5*  HCT  --  30.2* 27.0*  PLT  --  208 211  LABPROT 25.5* 26.3* 28.8*  INR 2.4* 2.5* 2.8*  CREATININE  --  0.91 1.01    Estimated Creatinine Clearance: 180.4 mL/min (by C-G formula based on SCr of 1.01 mg/dL).   Assessment: 49YOM admitted with septic shock secondary to necrotizing fascitis. Was on Xarelto PTA, but that is not appropriate based on patient's weight. Pharmacy has been consulted to dose warfarin.  INR is therapeutic (trending upwards) to 2.8. Warfarin held 2/4-2/7 & 2/10-2/13. Cautious dosing given previously supratherapeutic INRs and potential drug-drug interaction with amiodarone and Augmentin. Patient s/p Cortrak now eating 100% of meals. Hgb stable at 8.5 and pltc is stable within normal range. No bleeding noted.    Goal of Therapy:  INR 2-3 Monitor platelets by anticoagulation protocol: Yes  Plan:  Warfarin 5 mg PO tonight Daily INR and CBC Monitor for s/sx of bleeding  Thank you for involving pharmacy in this patient's care.  07-20-1970, PharmD, BCCCP Clinical Pharmacist  Phone: [phone] 07/18/2020 8:03 AM  Please check AMION for all [facility]acy phone numbers After 10:00 PM, call Main Pharmacy [phone]

## 2020-07-18 NOTE — Plan of Care (Signed)
  Problem: Clinical Measurements: Goal: Respiratory complications will improve Outcome: Progressing   Problem: Clinical Measurements: Goal: Cardiovascular complication will be avoided Outcome: Progressing   

## 2020-07-18 NOTE — Progress Notes (Signed)
Occupational Therapy Treatment Patient Details Name: Carl Hill MRN: [id number] DOB: 02/14/1971 Today's Date: 07/18/2020    History of present illness Pt is a 50 year old male who presented with buttock pain, fatigue and malaise.  Found to have hyperglycemia. CT pelvis demonstrated necrotizing fasciitis in L buttock, L upper thigh, and perineum. Taken to OR for debridement 1/10 c/b Afib with RVR and VT arrest s/p CPR/defib. Trached 1/26, remains on full vent support. PMHx: HTN, obesity, DM, AFib.  As of 07/04/20 started TC trials.   OT comments  Excellent participation today. Increased time during session to help with explaining all efforts to reduce anxiety and improve participation. Compression wrapped RLE (Pt would only allow 1 leg to be wrapped "for starters" due to fear of causing problems) to help with BP during standing.MD has approved to use SCDs which may help to use during tilting for BP.  BP appeared stable, however pt complaining of feeling lightheaded and nauseated @ 47 degrees. Able to spend at least 10-15  minutes @ 37 degrees without difficulty (MD assessing pt) and pt completing modified miniquats. Strengthening exercises/activities completed in semi-standing position. Attempted to move bed into chair position however pt unable to tolerate due to butt pain. Music (Poison) used during session to help manage anxiety. Long conversation with pt about his progress and need to participate more with his ADL tasks. Pt should be able to complete UB B with set up and goal is to use long handled sponge to help with LB bathing. Pt keeping head low and elevating BLE during the day. Educated pt on need to spend time during the day with head elevated and legs lower to help with BP issued with mobility. Pt verbalized understanding. Feel pt is appropriate for CIR level rehab. Will continue to follow acutely. Pt very appreciative.   Tilt Bed Documentation   Time Tilt Angle BP HR RR O2 Sat FiO2  Total Mins Tilted: Pt response*:  8:30am supine 125/76 114  95 28% 4L     20 126/99         30 126/103     15    47 136/100 123    3 min Lightheaded/nauseated      Follow Up Recommendations  CIR;Supervision/Assistance - 24 hour    Equipment Recommendations  Wheelchair (measurements OT);Wheelchair cushion (measurements OT);Hospital bed;3 in 1 bedside commode    Recommendations for Other Services Rehab consult    Precautions / Restrictions Precautions Precautions: Fall;Other (comment) Precaution Comments: TC (cuffless now), rectal tube, large buttocks wound, hydro on hold; PMSV       Mobility Bed Mobility Overal bed mobility: Needs Assistance Bed Mobility: Rolling Rolling: Supervision            Transfers                      Balance                                           ADL either performed or assessed with clinical judgement   ADL Overall ADL's : Needs assistance/impaired Eating/Feeding: Set up;Bed level Eating/Feeding Details (indicate cue type and reason): eating sidelying; using red tubing; difficulty opening soda lids; May benefit from lid opener     Upper Body Bathing: Minimal assistance;Bed level   Lower Body Bathing: Maximal assistance;Bed level  General ADL Comments: Discussed need for pt to participate more with his ADL tasks. Pt's goal is to complete his UB bathing. Will benefit form long handled sponge to assist with groin and LB bathing. Pt in agreement. At baseline pt's Mom has to clean his bottom area. May benefit form toilet aid once wound improved/healed     Vision       Perception     Praxis      Cognition Arousal/Alertness: Awake/alert Behavior During Therapy: Anxious Overall Cognitive Status: Within Functional Limits for tasks assessed                                          Exercises General Exercises - Upper Extremity Shoulder Flexion:  Strengthening;Both;10 reps (standing @ 40 degrees) Shoulder ABduction: Strengthening;Both;10 reps;Standing (40) Elbow Flexion: Strengthening;Both;10 reps;Standing Elbow Extension: Strengthening;Both;10 reps;Standing Other Exercises Other Exercises: PNF diagonals, reaching across midline to incorporate trunk/core strengthening Other Exercises: minimsquats in standing Other Exercises: toe taps in standing   Shoulder Instructions       General Comments Attempted chair position  - unable to tolerate due to complaints of butt pain. Focus on tilting- able to achieve 47 degrees today; increased time spent @ 35 degrees for @ 10 min due to doctor assessing pt during session    Pertinent Vitals/ Pain       Pain Assessment: Faces Faces Pain Scale: Hurts worst Pain Location: buttocks and back (with attempting chair position with bed) Pain Descriptors / Indicators: Burning;Constant;Jabbing Pain Intervention(s): Limited activity within patient's tolerance;Premedicated before session;Relaxation  Home Living                                          Prior Functioning/Environment              Frequency  Min 2X/week        Progress Toward Goals  OT Goals(current goals can now be found in the care plan section)  Progress towards OT goals: Progressing toward goals;Goals met and updated - see care plan  Acute Rehab OT Goals Patient Stated Goal: to be able to do more for himself adn go home OT Goal Formulation: With patient Time For Goal Achievement: 08/05/20 Potential to Achieve Goals: Good ADL Goals Pt Will Perform Eating: with modified independence;with adaptive utensils Pt Will Perform Upper Body Bathing: with set-up;bed level Pt Will Perform Lower Body Bathing: with mod assist;bed level;with adaptive equipment Additional ADL Goal #1: Pt will tolerate seated position x 10 min in preparation for ADL tasks  Plan Discharge plan needs to be updated;Frequency remains  appropriate    Co-evaluation                 AM-PAC OT "6 Clicks" Daily Activity     Outcome Measure   Help from another person eating meals?: A Little Help from another person taking care of personal grooming?: A Little Help from another person toileting, which includes using toliet, bedpan, or urinal?: Total Help from another person bathing (including washing, rinsing, drying)?: A Lot Help from another person to put on and taking off regular upper body clothing?: A Lot Help from another person to put on and taking off regular lower body clothing?: Total 6 Click Score: 12    End of Session Equipment Utilized During  Treatment: Oxygen (4L; 28% FiO2)  OT Visit Diagnosis: Unsteadiness on feet (R26.81);Muscle weakness (generalized) (M62.81);Pain Pain - part of body:  (back/butt)   Activity Tolerance Patient tolerated treatment well   Patient Left in bed;with call bell/phone within reach   Nurse Communication Mobility status;Other (comment) (possible use of SCDs to help with BP)        Time: 7262-0355 OT Time Calculation (min): 76 min  Charges: OT Treatments $Self Care/Home Management : 23-37 mins $Therapeutic Activity: 23-37 mins $Neuromuscular Re-education: 8-22 mins  Maurie Boettcher, OT/L   Acute OT Clinical Specialist Acute Rehabilitation Services Pager [phone] Office [phone]    [facility] 07/18/2020, 10:17 AM

## 2020-07-18 NOTE — Progress Notes (Signed)
Nutrition Follow-up  DOCUMENTATION CODES:   Morbid obesity  INTERVENTION:   -Continue double protein portions with all meals -Continue Magic cup TID with meals, each supplement provides 290 kcal and 9 grams of protein -Continue Ensure Enlive po BID, each supplement provides 350 kcal and 20 grams of protein -Continue Ensure Max po daily, each supplement provides 150 kcal and 30 grams of protein.  -Continue MVI with minerals daily  NUTRITION DIAGNOSIS:   Increased nutrient needs related to wound healing as evidenced by estimated needs.  Ongoing  GOAL:   Patient will meet greater than or equal to 90% of their needs  Progressing   MONITOR:   PO intake,Supplement acceptance,Labs,Weight trends,Skin,I & O's  REASON FOR ASSESSMENT:   Consult Enteral/tube feeding initiation and management  ASSESSMENT:   50 yo male admitted with hyperglycemia, severe septic shock r/t Fournier's gangrene, AKI. PMH includes morbid obesity, HTN, DM, chronic LE wounds (L heel, L calf) A fib, laparoscopic gastric sleeve resection.  1/26- s/p tracheostomy 2/4- transitioned to trach collar 2/8- s/p FEES- advanced to full liquid diet 2/9- advanced to dysphagia 3 diet, cortrak removed 2/15- downsized to #6 cuffless trach  Reviewed I/O's: -4.6 L x 24 hours and -45.1 L since 07/04/20  UOP: 6.9 L x 24 hours  Pt unavailable at time of visit.   Pt remains with very good appetite. Noted meal completions 100%. Pt has been consuming Ensure Enlive and Ensure Max supplements.   Medications reviewed and include senna.   Labs reviewed: CBGS: 131-213 (inpatient orders for glycemic control are 0-20 units insulin aspart TID with meals, 0-5 units insulin aspart daily at bedtime, and 18 units insulin aspart TID with meals, and 42 units insulin detemir BID).   Diet Order:   Diet Order            Diet Carb Modified Fluid consistency: Thin; Room service appropriate? Yes  Diet effective now                  EDUCATION NEEDS:   Education needs have been addressed  Skin:  Skin Assessment: Skin Integrity Issues: Skin Integrity Issues:: Stage II,Other (Comment) Stage II: R heel Other: necrotizing infection to pelvis, anus, rectum  Last BM:  07/17/20 (via rectal tube)  Height:   Ht Readings from Last 1 Encounters:  06/14/20 5\' 10"  (1.778 m)    Weight:   Wt Readings from Last 1 Encounters:  07/18/20 (!) 247.1 kg    Ideal Body Weight:  75.5 kg  BMI:  Body mass index is 78.16 kg/m.  Estimated Nutritional Needs:   Kcal:  07/20/20  Protein:  150-190 grams  Fluid:  > 2 L    [id number], RD, LDN, CDCES Registered Dietitian II Certified Diabetes Care and Education Specialist Please refer to [facility] for RD and/or RD on-call/weekend/after hours pager

## 2020-07-19 LAB — CBC
HCT: 27.1 % — ABNORMAL LOW (ref 39.0–52.0)
Hemoglobin: 8.6 g/dL — ABNORMAL LOW (ref 13.0–17.0)
MCH: 27.5 pg (ref 26.0–34.0)
MCHC: 31.7 g/dL (ref 30.0–36.0)
MCV: 86.6 fL (ref 80.0–100.0)
Platelets: 212 10*3/uL (ref 150–400)
RBC: 3.13 MIL/uL — ABNORMAL LOW (ref 4.22–5.81)
RDW: 15.9 % — ABNORMAL HIGH (ref 11.5–15.5)
WBC: 6.8 10*3/uL (ref 4.0–10.5)
nRBC: 0.4 % — ABNORMAL HIGH (ref 0.0–0.2)

## 2020-07-19 LAB — BASIC METABOLIC PANEL WITH GFR
Anion gap: 10 (ref 5–15)
BUN: 8 mg/dL (ref 6–20)
CO2: 31 mmol/L (ref 22–32)
Calcium: 8.4 mg/dL — ABNORMAL LOW (ref 8.9–10.3)
Chloride: 92 mmol/L — ABNORMAL LOW (ref 98–111)
Creatinine, Ser: 1.01 mg/dL (ref 0.61–1.24)
GFR, Estimated: >60 mL/min
Glucose, Bld: 144 mg/dL — ABNORMAL HIGH (ref 70–99)
Potassium: 3.2 mmol/L — ABNORMAL LOW (ref 3.5–5.1)
Sodium: 133 mmol/L — ABNORMAL LOW (ref 135–145)

## 2020-07-19 LAB — GLUCOSE, CAPILLARY
Glucose-Capillary: 153 mg/dL — ABNORMAL HIGH (ref 70–99)
Glucose-Capillary: 123 mg/dL — ABNORMAL HIGH (ref 70–99)
Glucose-Capillary: 133 mg/dL — ABNORMAL HIGH (ref 70–99)
Glucose-Capillary: 164 mg/dL — ABNORMAL HIGH (ref 70–99)

## 2020-07-19 LAB — PROTIME-INR
INR: 2.9 — ABNORMAL HIGH (ref 0.8–1.2)
Prothrombin Time: 29.6 s — ABNORMAL HIGH (ref 11.4–15.2)

## 2020-07-19 MED ORDER — POTASSIUM CHLORIDE CRYS ER 20 MEQ PO TBCR
40.0000 meq | EXTENDED_RELEASE_TABLET | Freq: Once | ORAL | Status: AC
  Administered 2020-07-19: 40 meq via ORAL
  Filled 2020-07-19: qty 2

## 2020-07-19 MED ORDER — WARFARIN SODIUM 5 MG PO TABS
5.0000 mg | ORAL_TABLET | Freq: Every day | ORAL | Status: DC
  Administered 2020-07-20: 5 mg via ORAL
  Filled 2020-07-19 (×2): qty 1

## 2020-07-19 MED ORDER — WARFARIN SODIUM 2 MG PO TABS
4.0000 mg | ORAL_TABLET | Freq: Once | ORAL | Status: AC
  Administered 2020-07-19: 4 mg via ORAL
  Filled 2020-07-19: qty 2

## 2020-07-19 NOTE — Progress Notes (Signed)
   07/19/20 1546  Assess: MEWS Score  Temp 98.5 F (36.9 C)  BP (!) 96/58  Pulse Rate (!) 101  ECG Heart Rate (!) 101  Resp 19  Level of Consciousness Alert  SpO2 97 %  O2 Device Tracheostomy Collar  Patient Activity (if Appropriate) In bed  O2 Flow Rate (L/min) 5 L/min  FiO2 (%) 28 %  Assess: MEWS Score  MEWS Temp 0  MEWS Systolic 1  MEWS Pulse 1  MEWS RR 0  MEWS LOC 0  MEWS Score 2  MEWS Score Color Yellow  Assess: if the MEWS score is Yellow or Red  Were vital signs taken at a resting state? Yes  Focused Assessment No change from prior assessment  Early Detection of Sepsis Score *See Row Information* Low  MEWS guidelines implemented *See Row Information* No, previously yellow, continue vital signs every 4 hours  Notify: Charge Nurse/RN  Name of Charge Nurse/RN Notified [email or web address]  Date Charge Nurse/RN Notified 07/19/20  Time Charge Nurse/RN Notified 1635

## 2020-07-19 NOTE — Progress Notes (Signed)
   07/19/20 0140  Assess: MEWS Score  Temp 98.4 F (36.9 C)  BP (!) 109/59  Pulse Rate (!) 106  ECG Heart Rate (!) 106  Resp 12  SpO2 92 %  O2 Device Tracheostomy Collar  O2 Flow Rate (L/min) 5 L/min  FiO2 (%) 28 %  Assess: MEWS Score  MEWS Temp 0  MEWS Systolic 0  MEWS Pulse 1  MEWS RR 1  MEWS LOC 0  MEWS Score 2  MEWS Score Color Yellow  Assess: if the MEWS score is Yellow or Red  Were vital signs taken at a resting state? Yes  Focused Assessment No change from prior assessment  Early Detection of Sepsis Score *See Row Information* Low  MEWS guidelines implemented *See Row Information* No, previously yellow, continue vital signs every 4 hours  Treat  MEWS Interventions Administered prn meds/treatments  Pain Scale 0-10  Pain Score 0  Notify: Charge Nurse/RN  Name of Charge Nurse/RN Notified [place] RN  Date Charge Nurse/RN Notified 07/19/20  Time Charge Nurse/RN Notified 0145  Document  Patient Outcome Not stable and remains on department  Progress note created (see row info) Yes

## 2020-07-19 NOTE — TOC Progression Note (Signed)
Transition of Care [facility]) - Progression Note    Patient Details  Name: Carl Hill MRN: [id number] Date of Birth: November 22, 1970  Transition of Care [facility]) CM/SW Contact  Erin Sons, [place] Phone Number: 07/19/2020, 3:52 PM  Clinical Narrative:     TOC and executive leadership are continuing to discuss pt disposition and solutions to discharge barriers. TOC will continue to follow.    Expected Discharge Plan: Skilled Nursing Facility Barriers to Discharge: Continued Medical Work up,SNF Pending bed offer,Inadequate or no insurance  Expected Discharge Plan and Services Expected Discharge Plan: Skilled Nursing Facility     Post Acute Care Choice: Skilled Nursing Facility                                         Social Determinants of Health (SDOH) Interventions    Readmission Risk Interventions No flowsheet data found.

## 2020-07-19 NOTE — Discharge Instructions (Signed)
Information on my medicine - Coumadin   (Warfarin)  This medication education was reviewed with me or my healthcare representative as part of my discharge preparation.  The pharmacist that spoke with me during my hospital stay was:  Leander Rams, [facility]  Why was Coumadin prescribed for you? Coumadin was prescribed for you because you have a blood clot or a medical condition that can cause an increased risk of forming blood clots. Blood clots can cause serious health problems by blocking the flow of blood to the heart, lung, or brain. Coumadin can prevent harmful blood clots from forming. As a reminder your indication for Coumadin is:   Select from menu  What test will check on my response to Coumadin? While on Coumadin (warfarin) you will need to have an INR test regularly to ensure that your dose is keeping you in the desired range. The INR (international normalized ratio) number is calculated from the result of the laboratory test called prothrombin time (PT).  If an INR APPOINTMENT HAS NOT ALREADY BEEN MADE FOR YOU please schedule an appointment to have this lab work done by your health care provider within 7 days. Your INR goal is usually a number between:  2 to 3 or your provider may give you a more narrow range like 2-2.5.  Ask your health care provider during an office visit what your goal INR is.  What  do you need to  know  About  COUMADIN? Take Coumadin (warfarin) exactly as prescribed by your healthcare provider about the same time each day.  DO NOT stop taking without talking to the doctor who prescribed the medication.  Stopping without other blood clot prevention medication to take the place of Coumadin may increase your risk of developing a new clot or stroke.  Get refills before you run out.  What do you do if you miss a dose? If you miss a dose, take it as soon as you remember on the same day then continue your regularly scheduled regimen the next day.  Do not take two doses of  Coumadin at the same time.  Important Safety Information A possible side effect of Coumadin (Warfarin) is an increased risk of bleeding. You should call your healthcare provider right away if you experience any of the following: ? Bleeding from an injury or your nose that does not stop. ? Unusual colored urine (red or dark brown) or unusual colored stools (red or black). ? Unusual bruising for unknown reasons. ? A serious fall or if you hit your head (even if there is no bleeding).  Some foods or medicines interact with Coumadin (warfarin) and might alter your response to warfarin. To help avoid this: ? Eat a balanced diet, maintaining a consistent amount of Vitamin K. ? Notify your provider about major diet changes you plan to make. ? Avoid alcohol or limit your intake to 1 drink for women and 2 drinks for men per day. (1 drink is 5 oz. wine, 12 oz. beer, or 1.5 oz. liquor.)  Make sure that ANY health care provider who prescribes medication for you knows that you are taking Coumadin (warfarin).  Also make sure the healthcare provider who is monitoring your Coumadin knows when you have started a new medication including herbals and non-prescription products.  Coumadin (Warfarin)  Major Drug Interactions  Increased Warfarin Effect Decreased Warfarin Effect  Alcohol (large quantities) Antibiotics (esp. Septra/Bactrim, Flagyl, Cipro) Amiodarone (Cordarone) Aspirin (ASA) Cimetidine (Tagamet) Megestrol (Megace) NSAIDs (ibuprofen, naproxen, etc.) Piroxicam (  Feldene) Propafenone (Rythmol SR) Propranolol (Inderal) Isoniazid (INH) Posaconazole (Noxafil) Barbiturates (Phenobarbital) Carbamazepine (Tegretol) Chlordiazepoxide (Librium) Cholestyramine (Questran) Griseofulvin Oral Contraceptives Rifampin Sucralfate (Carafate) Vitamin K   Coumadin (Warfarin) Major Herbal Interactions  Increased Warfarin Effect Decreased Warfarin Effect  Garlic Ginseng Ginkgo biloba Coenzyme Q10 Green  tea [place]. Johns wort    Coumadin (Warfarin) FOOD Interactions  Eat a consistent number of servings per week of foods HIGH in Vitamin K (1 serving =  cup)  Collards (cooked, or boiled & drained) Kale (cooked, or boiled & drained) Mustard greens (cooked, or boiled & drained) Parsley *serving size only =  cup Spinach (cooked, or boiled & drained) Swiss chard (cooked, or boiled & drained) Turnip greens (cooked, or boiled & drained)  Eat a consistent number of servings per week of foods MEDIUM-HIGH in Vitamin K (1 serving = 1 cup)  Asparagus (cooked, or boiled & drained) Broccoli (cooked, boiled & drained, or raw & chopped) Brussel sprouts (cooked, or boiled & drained) *serving size only =  cup Lettuce, raw (green leaf, endive, romaine) Spinach, raw Turnip greens, raw & chopped   These websites have more information on Coumadin (warfarin):  http://www.king-russell.com/; https://www.hines.net/;   ====================================================  Vitamin K Foods and Warfarin Warfarin is a blood thinner (anticoagulant). Anticoagulant medicines help prevent the formation of blood clots. These medicines work by decreasing the activity of vitamin K, which promotes normal blood clotting. When you take warfarin, problems can occur from suddenly increasing or decreasing the amount of vitamin K that you eat from one day to the next. Problems may include:  Blood clots.  Bleeding.  What general guidelines do I need to follow? To avoid problems when taking warfarin: 1. Eat a balanced diet that includes: ? Fresh fruits and vegetables. ? Whole grains. ? Low-fat dairy products. ? Lean proteins, such as fish, eggs, and lean cuts of meat. 2. Keep your intake of vitamin K consistent from day to day. To do this: ? Avoid eating large amounts of vitamin K one day and low amounts of vitamin K the next day. ? If you take a multivitamin that contains vitamin K, be sure to take it every  day. ? Know which foods contain vitamin K. Use the lists below to understand serving sizes and the amount of vitamin K in one serving. 3. Avoid major changes in your diet. If you are going to change your diet, talk with your health care provider before making changes. 4. Work with a Dealer (dietitian) to develop a meal plan that works best for you.  High vitamin K foods Foods that are high in vitamin K contain more than 100 mcg (micrograms) per serving. These include:  Broccoli (cooked) -  cup has 110 mcg.  Brussels sprouts (cooked) -  cup has 109 mcg.  Greens, beet (cooked) -  cup has 350 mcg.  Greens, collard (cooked) -  cup has 418 mcg.  Greens, turnip (cooked) -  cup has 265 mcg.  Green onions or scallions -  cup has 105 mcg.  Kale (fresh or frozen) -  cup has 531 mcg.  Parsley (raw) - 10 sprigs has 164 mcg.  Spinach (cooked) -  cup has 444 mcg.  Swiss chard (cooked) -  cup has 287 mcg.  Moderate vitamin K foods Foods that have a moderate amount of vitamin K contain 25-100 mcg per serving. These include:  Asparagus (cooked) - 5 spears have 38 mcg.  Black-eyed peas (dried) -  cup has 32 mcg.  Cabbage (cooked) -  cup has 37 mcg.  Kiwi fruit - 1 medium has 31 mcg.  Lettuce - 1 cup has 57-63 mcg.  Okra (frozen) -  cup has 44 mcg.  Prunes (dried) - 5 prunes have 25 mcg.  Watercress (raw) - 1 cup has 85 mcg.  Low vitamin K foods Foods low in vitamin K contain less than 25 mcg per serving. These include:  Artichoke - 1 medium has 18 mcg.  Avocado - 1 oz. has 6 mcg.  Blueberries -  cup has 14 mcg.  Cabbage (raw) -  cup has 21 mcg.  Carrots (cooked) -  cup has 11 mcg.  Cauliflower (raw) -  cup has 11 mcg.  Cucumber with peel (raw) -  cup has 9 mcg.  Grapes -  cup has 12 mcg.  Mango - 1 medium has 9 mcg.  Nuts - 1 oz. has 15 mcg.  Pear - 1 medium has 8 mcg.  Peas (cooked) -  cup has 19 mcg.  Pickles - 1 spear has 14  mcg.  Pumpkin seeds - 1 oz. has 13 mcg.  Sauerkraut (canned) -  cup has 16 mcg.  Soybeans (cooked) -  cup has 16 mcg.  Tomato (raw) - 1 medium has 10 mcg.  Tomato sauce -  cup has 17 mcg.  Vitamin K-free foods If a food contain less than 5 mcg per serving, it is considered to have no vitamin K. These foods include:  Bread and cereal products.  Cheese.  Eggs.  Fish and shellfish.  Meat and poultry.  Milk and dairy products.  Sunflower seeds.  Actual amounts of vitamin K in foods may be different depending on processing. Talk with your dietitian about what foods you can eat and what foods you should avoid.  This information is not intended to replace advice given to you by your health care provider. Make sure you discuss any questions you have with your health care provider.  Document Revised: 04/30/2017 Document Reviewed: 08/21/2015 Elsevier Patient Education  [employer]

## 2020-07-19 NOTE — Progress Notes (Signed)
Physical Therapy Treatment Patient Details Name: Carl Hill MRN: [id number] DOB: August 31, 1970 Today's Date: 07/19/2020    History of Present Illness Pt is a 50 year old male who presented with buttock pain, fatigue and malaise.  Found to have hyperglycemia. CT pelvis demonstrated necrotizing fasciitis in L buttock, L upper thigh, and perineum. Taken to OR for debridement 1/10 c/b Afib with RVR and VT arrest s/p CPR/defib. Trached 1/26, remains on full vent support. PMHx: HTN, obesity, DM, AFib.  As of 07/04/20 started TC trials.    PT Comments    Pt was able to progress tilt to 65 degrees this PM and sustained ~5 mins with mild lightheadedness.  Max tilt is 70 degrees.  Pt reporting legs were shaky up that high, but tolerated tilt well.  He continues to roll and reposition himself in the bed with supervision.  We are hopeful to try EOB next session, I just could not get extra help for our unplanned PM session today.  I continue to be encouraged by his continued progress.  I got the bari RW set up in his room to try reaching to that from max tilt.     Follow Up Recommendations  CIR     Equipment Recommendations  Wheelchair (measurements PT);Wheelchair cushion (measurements PT);Hospital bed;Other (comment);3in1 (PT) (hoyer lift, bariatric equipment)    Recommendations for Other Services Rehab consult     Precautions / Restrictions Precautions Precautions: Fall Precaution Comments: TC (cuffless now), rectal tube, large buttocks wound, hydro on hold; PMSV    Mobility  Bed Mobility Overal bed mobility: Needs Assistance Bed Mobility: Rolling Rolling: Supervision         General bed mobility comments: supervision and extra time needed, heavy use of rail. Start Time: 1305 Angle: 65 degrees (max angle) Total Minutes in Angle: 5 minutes Patient Response: Cooperative  Transfers                 General transfer comment: We were going to attempt sitting EOB, but when I had  help this AM he was in too much pain, wil have to defer when second person is available.  I did get bari RW and place in room to attempt to go from full tilt to holding RW handles.  Ambulation/Gait                 Stairs             Wheelchair Mobility    Modified Rankin (Stroke Patients Only)       Balance                                            Cognition Arousal/Alertness: Awake/alert Behavior During Therapy: WFL for tasks assessed/performed Overall Cognitive Status: Within Functional Limits for tasks assessed                                 General Comments: Anxiety has decreased since building rapport with therapists.      Exercises General Exercises - Upper Extremity Shoulder Horizontal ABduction: AROM;Both;20 reps;Theraband Theraband Level (Shoulder Horizontal Abduction): Level 2 (Red) Elbow Flexion: AROM;Both;20 reps;Theraband Theraband Level (Elbow Flexion): Level 2 (Red) General Exercises - Lower Extremity Quad Sets: AROM;Both;20 reps Other Exercises Other Exercises: bil arm reaches (sit ups) x 20 reps from 50 degrees of  tilt    General Comments General comments (skin integrity, edema, etc.): VSS throughout, some nausea at end of session, minimal lightheadedness even at maximal tilt.      Pertinent Vitals/Pain Pain Assessment: Faces Faces Pain Scale: Hurts even more Pain Location: buttocks and back Pain Descriptors / Indicators: Guarding Pain Intervention(s): Limited activity within patient's tolerance;Monitored during session;Repositioned;Premedicated before session    Home Living                      Prior Function            PT Goals (current goals can now be found in the care plan section) Acute Rehab PT Goals Patient Stated Goal: to get stronger, try sitting up EOB, get back to walking with RW Progress towards PT goals: Progressing toward goals    Frequency    Min 3X/week       PT Plan Current plan remains appropriate    Co-evaluation              AM-PAC PT "6 Clicks" Mobility   Outcome Measure  Help needed turning from your back to your side while in a flat bed without using bedrails?: A Little Help needed moving from lying on your back to sitting on the side of a flat bed without using bedrails?: Total Help needed moving to and from a bed to a chair (including a wheelchair)?: Total Help needed standing up from a chair using your arms (e.g., wheelchair or bedside chair)?: Total Help needed to walk in hospital room?: Total Help needed climbing 3-5 steps with a railing? : Total 6 Click Score: 8    End of Session Equipment Utilized During Treatment: Oxygen (28% TC, 3.5 L) Activity Tolerance: Patient limited by pain Patient left: in bed;with call bell/phone within reach;with family/visitor present   PT Visit Diagnosis: Muscle weakness (generalized) (M62.81);Other abnormalities of gait and mobility (R26.89)     Time: 9381-8299 PT Time Calculation (min) (ACUTE ONLY): 70 min  Charges:  $Therapeutic Exercise: 23-37 mins $Therapeutic Activity: 38-52 mins                     Corinna Capra, PT, DPT  Acute Rehabilitation [phone] pager [phone]) [phone] office

## 2020-07-19 NOTE — Progress Notes (Signed)
ANTICOAGULATION CONSULT NOTE  Pharmacy Consult for Warfarin  Indication: atrial fibrillation  Patient Measurements: Height: 5\' 10"  (177.8 cm) Weight: (!) 244.8 kg (539 lb 11 oz) IBW/kg (Calculated) : 73 Heparin Dosing Weight: 149.7 kg  Vital Signs: Temp: 98.2 F (36.8 C) (02/18 0730) Temp Source: Oral (02/18 0730) BP: 120/75 (02/18 0730) Pulse Rate: 109 (02/18 0811)  Labs: Recent Labs    07/17/20 0342 07/18/20 0259 07/19/20 0314  HGB 9.1* 8.5* 8.6*  HCT 30.2* 27.0* 27.1*  PLT 208 211 212  LABPROT 26.3* 28.8* 29.6*  INR 2.5* 2.8* 2.9*  CREATININE 0.91 1.01 1.01    Estimated Creatinine Clearance: 179.3 mL/min (by C-G formula based on SCr of 1.01 mg/dL).   Assessment: 49YOM admitted with septic shock secondary to necrotizing fascitis. Was on Xarelto PTA, but that is not appropriate based on patient's weight. Pharmacy has been consulted to dose warfarin.  INR is therapeutic (trending upwards) to 2.9. Warfarin held 2/4-2/7 & 2/10-2/13 for high INR. Noted drug-drug interactions with amiodarone and Augmentin. Eating 100% of meals. Hgb, plt stable. No bleeding noted. Per doses given so far, suspect will maintain on 5mg  daily.   Goal of Therapy:  INR 2-3 Monitor platelets by anticoagulation protocol: Yes  Plan:  Warfarin 4mg  x1 then 5mg  daily  F/u QMon/Thr INR and weekly CBC Monitor for s/sx of bleeding  Thank you for involving pharmacy in this patient's care.  07-20-1970, PharmD, BCPS, BCCP Clinical Pharmacist  Please check AMION for all [facility] Pharm[facility] phone numbers After 10:00 PM, call Main Pharmacy [phone]

## 2020-07-19 NOTE — Progress Notes (Signed)
PT Cancellation Note  Patient Details Name: Carl Hill MRN: [id number] DOB: Mar 18, 1971   Cancelled Treatment:    Reason Eval/Treat Not Completed: Pain limiting ability to participate.  Pt having to have his buttocks wound dressing re-changed since it was done last night due to discomfort in that area thought to be from the dressing itself.  Pt tearful from painful dressing change and PT, RN and pt devised a plan for PT to come back just after lunch when his next round of pain meds are due to re-attempt therapy.  Kreg bed rep will not be here at that time, but this PT is confident in tilt bed.  Pt wants to try to pull up to sit EOB.    Thanks,  Corinna Capra, PT, DPT  Acute Rehabilitation [phone] pager #[phone] office       Carl Hill 07/19/2020, 9:28 AM

## 2020-07-19 NOTE — Progress Notes (Signed)
Patient seen and examined, no changes from my note yesterday, remains stable getting wound care and pulmonary toilet for tracheostomy  Zannie Cove, MD

## 2020-07-20 DIAGNOSIS — N493 Fournier gangrene: Secondary | ICD-10-CM | POA: Diagnosis not present

## 2020-07-20 LAB — GLUCOSE, CAPILLARY
Glucose-Capillary: 131 mg/dL — ABNORMAL HIGH (ref 70–99)
Glucose-Capillary: 187 mg/dL — ABNORMAL HIGH (ref 70–99)
Glucose-Capillary: 176 mg/dL — ABNORMAL HIGH (ref 70–99)
Glucose-Capillary: 149 mg/dL — ABNORMAL HIGH (ref 70–99)

## 2020-07-20 MED ORDER — DILTIAZEM HCL ER COATED BEADS 180 MG PO CP24
180.0000 mg | ORAL_CAPSULE | Freq: Every day | ORAL | Status: DC
  Administered 2020-07-20 – 2020-07-21 (×2): 180 mg via ORAL
  Filled 2020-07-20 (×2): qty 1

## 2020-07-20 NOTE — Progress Notes (Signed)
dressing change by MD order patient tolerated well the patient medicated before.contiunue with plan of care.

## 2020-07-20 NOTE — Progress Notes (Signed)
PROGRESS NOTE    Carl Hill  IEP:[id number] DOB: 1971/02/19 DOA: 06/10/2020 PCP: Malka So., MD   Chief Complaint  Patient presents with  . Hyperglycemia    Brief Narrative:  50 year old male with piror h/o HTN, obesity, DM, AFib on DOAC presented with buttock pain, malaise. CT pelvis showed demonstrated necrotizing fasciitis in L buttock, L upper thigh, and perineum. Taken to OR for debridement 1/10, complicated by Afib with RVR and VT arrest s/p CPR/defib.  Prolonged mechanical ventilation, eventually trached on 1/26,.  Currently on trach collar. He was transferred to progressive care to [facility] on 07/10/20.  -Trach was downgraded to #6 cuffless Shiley on 2/15, per  Pulm no plans for decannulation until has increased mobility or weight loss.  Significant Hospital Events:  1/10  Admitted to [facility] with sepsis, necrotizing fasciitis. General surgery consulted and debridement pursued. In the afternoon, patient developed hypotension with AF in RVR. Synchronized cardioversion unsuccessful with conversion to VT. CPR initated with ROSC after desynchronized defibrillation. 1/11 Debridement 1/18 Mucus plugging 1/19 Bronchoscopy >> mucus plug on Rt, airway collapse with exhalation 1/20 Persistent fever, change ABx 1/24 Increased vent requirements, habitus related +/- mucus plugging 1/25 Hypotensive to SBP 80s, Levo resumed, minimally responsive overnight off sedation, CT Head negative 1/26 Stable vent/pressor requirements, bronched, tracheostomy 1/29 start hydrotherapy 1/30 start lovenox/coumadin 2/2 Tolerating PS 2/4 did TC for 12 hours 2/5 Did 24 hours TC 2/6 Continue TC  2/7 Try dilaudid with turns/dressing change for better pain control - longer acting, fentanyl IV d/c'd 2/8 Increase standing oxy, dilaudid PRN for turns/dressing changes, downgrade to progressive 2/15 trach changed to # 6 cuffless   Assessment & Plan:    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  failure in the setting of sepsis, morbid obesity, health care associated pneumonia, recurrent mucus plugging:  -Prolonged respiratory failure requiring mechanical ventilation, failed attempts at extubation  -Status post tracheostomy on 1/26, weaned off ventilator -Remains stable on trach collar, PCCM following peripherally -Trach was downgraded to cuffless #6 Shiley on 2/15 -SLP following -Continue trach care, per pulmonary no plans for decannulation -Remains stable, TOC following, difficult placement  Left buttock necrotizing fascitis with E coli , Group B strep, peptostreptococcus in wound culture -Excisional debridement of perianal, gluteal and perineal necrotizing soft tissue infection by Dr. Freida Busman 1/10 -Back to the OR 1/11 and excisional debridement of perineal necrosis, Dr.Allen -followed by hydrotherapy 06/29/20 till 07/04/20 No further debridement. General surgery recommended wound care to follow up.  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   Type 2 diabetes mellitus  SLP eval recommending regular diet with thin liquid.  Currently on 42 units BID, Increased novolog to 18 units TIDAC.  -Stable  Acute blood loss anemia; from blood loss from wound.  S/p PRBC transfusion. -Hemoglobin is relatively stable  Hypothyroidism:  Continue with synthroid.   Atrial fibrillation:  -Heart rate suboptimal, continue amiodarone, add low-dose diltiazem -Was on Xarelto prior to admission, this was changed to Coumadin in the ICU per pharmacy as Xarelto was not felt to be safe for body weight > 500 pounds  Pressure injury present on admission:  Pressure Injury 06/10/20 Heel Right Stage 2 -  Partial thickness loss of dermis presenting as a shallow open injury with a red, pink  wound  bed without slough. (Active)  06/10/20 1400  Location: Heel  Location Orientation: Right  Staging: Stage 2 -  Partial thickness loss of dermis presenting as a shallow open injury with a red, pink wound bed without slough.  Wound Description (Comments):   Present on Admission: Yes   Wound care reconsulted .    Hyperlipidemia:  Resume lipitor.    Intermittent blurry vision, could be related to hypotension, vs narcotics , also happens at home, CT head without contrast does not show any intracranial abnormality.  Pt will need ophthalmology evaluation when discharged.   DVT prophylaxis: (Warfarin) Code Status: (Full Code) Family Communication: none at bedside.   Disposition:   Status is: Inpatient  Remains inpatient appropriate because:Unsafe d/c plan and Inpatient level of care appropriate due to severity of illness   Dispo: The patient is from: Home              Anticipated d/c is to: SNF              Anticipated d/c date is: > 3 days              Patient currently is medically stable to d/c.   Difficult to place patient Yes  Level of care: Progressive   Consultants:   General surgery.   ID  PCCM.    Procedures:  ETT 1/10 >>  R IJ CVL 1/10 >>  L brachial A-line 1/10 >>  RECTAL TUBE.  S/P TRACH  ON 1/26 6 CUFFLESS ON 2/15   Antimicrobials: Aztreonam 1/09 Metronidazole 1/09 Vancomycin 1/09, 1/11, 1/20 >>1/22 Cefepime 1/10 >> 1/13, 1/20 >>1/22 Clindamycin 1/10 >> 1/13 Ceftriaxone 1/13 >> 1/20 Metronidazole 1/17 >> 1/22 Meropenem 1/22 >> 1/25 Amoxicillin 1/24 (x 1 for PCN Allergy challenge) Unasyn 1/25 >> 1/27 Augmentin 1/27 >>  Subjective: -Feels okay, denies any complaints  Objective: Vitals:   07/20/20 0331 07/20/20 0830 07/20/20 1158 07/20/20 1158  BP:   122/75 122/75  Pulse: (!) 108  (!) 103 (!) 103  Resp: 20 20 19 19   Temp:   98.5 F (36.9 C) 98.5 F (36.9 C)  TempSrc:   Oral Oral  SpO2: 100%  96% 96%  Weight:      Height:         Intake/Output Summary (Last 24 hours) at 07/20/2020 1316 Last data filed at 07/20/2020 0500 Gross per 24 hour  Intake 680 ml  Output 5275 ml  Net -4595 ml   Filed Weights   07/17/20 0405 07/18/20 0044 07/19/20 0416  Weight: (!) 245.5 kg (!) 247.1 kg (!) 244.8 kg    Examination:  General exam: Morbidly obese chronically ill male laying in bed, AAOx3, no distress HEENT: Tracheostomy with trach collar CVS: S1-S2, irregularly irregular rhythm Lungs: Distant breath sounds, decreased at the bases Abdomen: Obese, soft, nontender, bowel sounds present Extremities: Dry scaly skin of lower extremities with venous stasis changes  Skin, bilateral sacral decubitus wounds with dressing  Data Reviewed: I have personally reviewed following labs and imaging studies  CBC: Recent Labs  Lab 07/14/20 0628 07/17/20 0342 07/18/20 0259 07/19/20 0314  WBC 7.1 6.9 7.6 6.8  HGB 8.6* 9.1* 8.5* 8.6*  HCT 29.5* 30.2* 27.0* 27.1*  MCV 92.2 87.5 87.4 86.6  PLT 222 208 211 212    Basic Metabolic Panel: Recent Labs  Lab 07/17/20 0342 07/18/20 0259 07/19/20 0314  NA 134* 135 133*  K 3.6 3.6 3.2*  CL 93* 92* 92*  CO2 30 32 31  GLUCOSE  151* 150* 144*  BUN 11 11 8   CREATININE 0.91 1.01 1.01  CALCIUM 8.7* 8.5* 8.4*    GFR: Estimated Creatinine Clearance: 179.3 mL/min (by C-G formula based on SCr of 1.01 mg/dL).  Liver Function Tests: No results for input(s): AST, ALT, ALKPHOS, BILITOT, PROT, ALBUMIN in the last 168 hours.  CBG: Recent Labs  Lab 07/19/20 1139 07/19/20 1606 07/19/20 2101 07/20/20 0638 07/20/20 1156  GLUCAP 123* 133* 164* 131* 187*     No results found for this or any previous visit (from the past 240 hour(s)).   Scheduled Meds: . amiodarone  200 mg Oral Daily  . amoxicillin-clavulanate  1 tablet Oral Q8H  . atorvastatin  20 mg Oral QPM  . chlorhexidine  15 mL Mouth Rinse BID  . Chlorhexidine Gluconate Cloth  6 each Topical Daily  . collagenase  1  application Topical Daily  . diltiazem  180 mg Oral Daily  . feeding supplement  237 mL Oral BID BM  . insulin aspart  0-20 Units Subcutaneous TID WC  . insulin aspart  0-5 Units Subcutaneous QHS  . insulin aspart  18 Units Subcutaneous TID WC  . insulin detemir  42 Units Subcutaneous BID  . levothyroxine  75 mcg Oral Daily  . mouth rinse  15 mL Mouth Rinse q12n4p  . multivitamin with minerals  1 tablet Oral Daily  . nortriptyline  25 mg Oral BID  . pantoprazole  40 mg Oral Daily  . pregabalin  300 mg Oral BID  . Ensure Max Protein  11 oz Oral QHS  . senna-docusate  2 tablet Oral BID  . sodium chloride flush  3 mL Intravenous Q12H  . warfarin  5 mg Oral q1600  . Warfarin - Pharmacist Dosing Inpatient   Does not apply q1600   Continuous Infusions: . sodium chloride Stopped (06/28/20 2035)     LOS: 40 days   2036, MD [facility]  07/20/2020, 1:16 PM

## 2020-07-21 DIAGNOSIS — N493 Fournier gangrene: Secondary | ICD-10-CM | POA: Diagnosis not present

## 2020-07-21 LAB — GLUCOSE, CAPILLARY
Glucose-Capillary: 199 mg/dL — ABNORMAL HIGH (ref 70–99)
Glucose-Capillary: 187 mg/dL — ABNORMAL HIGH (ref 70–99)
Glucose-Capillary: 143 mg/dL — ABNORMAL HIGH (ref 70–99)
Glucose-Capillary: 119 mg/dL — ABNORMAL HIGH (ref 70–99)

## 2020-07-21 LAB — BASIC METABOLIC PANEL
Anion gap: 12 (ref 5–15)
BUN: 8 mg/dL (ref 6–20)
CO2: 34 mmol/L — ABNORMAL HIGH (ref 22–32)
Calcium: 8.8 mg/dL — ABNORMAL LOW (ref 8.9–10.3)
Chloride: 92 mmol/L — ABNORMAL LOW (ref 98–111)
Creatinine, Ser: 0.93 mg/dL (ref 0.61–1.24)
GFR, Estimated: >60 mL/min (ref >60)
Glucose, Bld: 131 mg/dL — ABNORMAL HIGH (ref 70–99)
Potassium: 4.1 mmol/L (ref 3.5–5.1)
Sodium: 138 mmol/L (ref 135–145)

## 2020-07-21 LAB — CBC
HCT: 29.1 % — ABNORMAL LOW (ref 39.0–52.0)
Hemoglobin: 9 g/dL — ABNORMAL LOW (ref 13.0–17.0)
MCH: 27.3 pg (ref 26.0–34.0)
MCHC: 30.9 g/dL (ref 30.0–36.0)
MCV: 88.2 fL (ref 80.0–100.0)
Platelets: 243 10*3/uL (ref 150–400)
RBC: 3.3 MIL/uL — ABNORMAL LOW (ref 4.22–5.81)
RDW: 16.1 % — ABNORMAL HIGH (ref 11.5–15.5)
WBC: 7.3 10*3/uL (ref 4.0–10.5)
nRBC: 0 % (ref 0.0–0.2)

## 2020-07-21 MED ORDER — DILTIAZEM HCL ER COATED BEADS 240 MG PO CP24
240.0000 mg | ORAL_CAPSULE | Freq: Every day | ORAL | Status: DC
  Administered 2020-07-22 – 2020-08-05 (×15): 240 mg via ORAL
  Filled 2020-07-21 (×16): qty 1

## 2020-07-21 NOTE — Progress Notes (Signed)
Nurses at [facility] placing PIV,

## 2020-07-21 NOTE — Progress Notes (Signed)
PROGRESS NOTE    Carl Hill  WUJ:[id number] DOB: 03-Jan-1971 DOA: 06/10/2020 PCP: Malka So., MD   Chief Complaint  Patient presents with  . Hyperglycemia    Brief Narrative:  50 year old male with piror h/o HTN, obesity, DM, AFib on DOAC presented with buttock pain, malaise. CT pelvis showed demonstrated necrotizing fasciitis in L buttock, L upper thigh, and perineum. Taken to OR for debridement 1/10, complicated by Afib with RVR and VT arrest s/p CPR/defib.  Prolonged mechanical ventilation, eventually trached on 1/26,.  Currently on trach collar. He was transferred to progressive care to [facility] on 07/10/20.  -Trach was downgraded to #6 cuffless Shiley on 2/15, per  Pulm no plans for decannulation until has increased mobility or weight loss.  Significant Hospital Events:  1/10  Admitted to [facility] with sepsis, necrotizing fasciitis. General surgery consulted and debridement pursued. In the afternoon, patient developed hypotension with AF in RVR. Synchronized cardioversion unsuccessful with conversion to VT. CPR initated with ROSC after desynchronized defibrillation. 1/11 Debridement 1/18 Mucus plugging 1/19 Bronchoscopy >> mucus plug on Rt, airway collapse with exhalation 1/20 Persistent fever, change ABx 1/24 Increased vent requirements, habitus related +/- mucus plugging 1/25 Hypotensive to SBP 80s, Levo resumed, minimally responsive overnight off sedation, CT Head negative 1/26 Stable vent/pressor requirements, bronched, tracheostomy 1/29 start hydrotherapy 1/30 start lovenox/coumadin 2/2 Tolerating PS 2/4 did TC for 12 hours 2/5 Did 24 hours TC 2/6 Continue TC  2/7 Try dilaudid with turns/dressing change for better pain control - longer acting, fentanyl IV d/c'd 2/8 Increase standing oxy, dilaudid PRN for turns/dressing changes, downgrade to progressive 2/15 trach changed to # 6 cuffless   Assessment & Plan:    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  failure in the setting of sepsis, morbid obesity, health care associated pneumonia, recurrent mucus plugging:  -Prolonged respiratory failure requiring mechanical ventilation, failed attempts at extubation  -Status post tracheostomy on 1/26, weaned off ventilator -Remains stable on trach collar, PCCM following peripherally -Trach was downgraded to cuffless #6 Shiley on 2/15 -SLP following -Continue trach care, per pulmonary no plans for decannulation -Remains stable overall, difficult SNF placement, TOC follow-up  Left buttock necrotizing fascitis with E coli , Group B strep, peptostreptococcus in wound culture -Excisional debridement of perianal, gluteal and perineal necrotizing soft tissue infection by Dr. Freida Busman 1/10 -Back to the OR 1/11 and excisional debridement of perineal necrosis, Dr.Allen -followed by hydrotherapy 06/29/20 till 07/04/20 No further debridement. General surgery recommended wound care to follow up.  -Seen by infectious disease who recommended Unasyn followed by Augmentin for 49-month course due to actinomyces in the wound -Started on a 88-month course of Augmentin per ID recommendations -Continue current dressing changes  Hypernatremia from diuresis and insensible losses:  Resolved.   Type 2 diabetes mellitus  SLP eval recommending regular diet with thin liquid.  Currently on 42 units BID, Increased novolog to 18 units TIDAC.  -Stable  Acute blood loss anemia; from blood loss from wound.  S/p PRBC transfusion. -Hemoglobin is relatively stable  Hypothyroidism:  Continue with synthroid.   Atrial fibrillation:  -Heart rate suboptimal, continue amiodarone,  -Was on Xarelto prior to admission, this was changed to Coumadin in the ICU per pharmacy as Xarelto was not felt to be safe for body weight > 500 pounds -Increase diltiazem dose  Pressure injury present on admission:  Pressure Injury 06/10/20 Heel Right Stage 2 -  Partial thickness loss of dermis presenting as  a shallow open injury  with a red, pink wound bed without slough. (Active)  06/10/20 1400  Location: Heel  Location Orientation: Right  Staging: Stage 2 -  Partial thickness loss of dermis presenting as a shallow open injury with a red, pink wound bed without slough.  Wound Description (Comments):   Present on Admission: Yes   Wound care reconsulted .   Hyperlipidemia:  Resume lipitor.   Intermittent blurry vision, could be related to hypotension, vs narcotics , also happens at home, -Now improved  DVT prophylaxis: (Warfarin) Code Status: (Full Code) Family Communication: none at bedside.   Disposition:   Status is: Inpatient  Remains inpatient appropriate because:Unsafe d/c plan and Inpatient level of care appropriate due to severity of illness   Dispo: The patient is from: Home              Anticipated d/c is to: SNF              Anticipated d/c date is: > 3 days              Patient currently is medically stable to d/c.   Difficult to place patient Yes  Level of care: Progressive   Consultants:   General surgery.   ID  PCCM.    Procedures:  ETT 1/10 >>  R IJ CVL 1/10 >>  L brachial A-line 1/10 >>  RECTAL TUBE.  S/P TRACH  ON 1/26 6 CUFFLESS ON 2/15   Antimicrobials: Aztreonam 1/09 Metronidazole 1/09 Vancomycin 1/09, 1/11, 1/20 >>1/22 Cefepime 1/10 >> 1/13, 1/20 >>1/22 Clindamycin 1/10 >> 1/13 Ceftriaxone 1/13 >> 1/20 Metronidazole 1/17 >> 1/22 Meropenem 1/22 >> 1/25 Amoxicillin 1/24 (x 1 for PCN Allergy challenge) Unasyn 1/25 >> 1/27 Augmentin 1/27 >>  Subjective: -Feels well, denies any complaints, awaiting physical therapy tomorrow  Objective: Vitals:   07/21/20 0021 07/21/20 0303 07/21/20 0353 07/21/20 0803  BP: 109/66  131/65 (!) 141/84  Pulse: 100 (!) 104 (!) 103 (!) 110  Resp:  16 16 20   Temp: 98.1 F (36.7 C)  98 F (36.7 C) 98 F (36.7 C)  TempSrc: Oral  Oral Oral  SpO2: 96%  94% 94%  Weight:   (!) 243.2 kg   Height:         Intake/Output Summary (Last 24 hours) at 07/21/2020 1128 Last data filed at 07/21/2020 1007 Gross per 24 hour  Intake 3468 ml  Output 6050 ml  Net -2582 ml   Filed Weights   07/18/20 0044 07/19/20 0416 07/21/20 0353  Weight: (!) 247.1 kg (!) 244.8 kg (!) 243.2 kg    Examination:  General exam: Morbidly obese chronically ill male laying in bed, AAOx3, no distress HEENT: Tracheostomy with trach collar CVS: S1-S2, irregularly irregular rhythm Lungs: Distant breath sounds, decreased at the bases Abdomen: Obese, soft, nontender, bowel sounds present Extremities: Dry scaly skin of lower extremities with venous stasis changes  Skin, bilateral sacral decubitus wounds with dressing  Data Reviewed: I have personally reviewed following labs and imaging studies  CBC: Recent Labs  Lab 07/17/20 0342 07/18/20 0259 07/19/20 0314 07/21/20 0257  WBC 6.9 7.6 6.8 7.3  HGB 9.1* 8.5* 8.6* 9.0*  HCT 30.2* 27.0* 27.1* 29.1*  MCV 87.5 87.4 86.6 88.2  PLT 208 211 212 243    Basic Metabolic Panel: Recent Labs  Lab 07/17/20 0342 07/18/20 0259 07/19/20 0314 07/21/20 0257  NA 134* 135 133* 138  K 3.6 3.6 3.2* 4.1  CL 93* 92* 92* 92*  CO2 30 32 31 34*  GLUCOSE 151* 150* 144* 131*  BUN 11 11 8 8   CREATININE 0.91 1.01 1.01 0.93  CALCIUM 8.7* 8.5* 8.4* 8.8*    GFR: Estimated Creatinine Clearance: 193.9 mL/min (by C-G formula based on SCr of 0.93 mg/dL).  Liver Function Tests: No results for input(s): AST, ALT, ALKPHOS, BILITOT, PROT, ALBUMIN in the last 168 hours.  CBG: Recent Labs  Lab 07/20/20 0638 07/20/20 1156 07/20/20 1640 07/20/20 2121 07/21/20 0921  GLUCAP 131* 187* 176* 149* 199*     No results found for this or any previous visit (from the past 240 hour(s)).   Scheduled Meds: . amiodarone  200 mg Oral Daily  . amoxicillin-clavulanate  1 tablet Oral Q8H  . atorvastatin  20 mg Oral QPM  . chlorhexidine  15 mL Mouth Rinse BID  . Chlorhexidine Gluconate Cloth  6  each Topical Daily  . collagenase  1 application Topical Daily  . [START ON 07/22/2020] diltiazem  240 mg Oral Daily  . feeding supplement  237 mL Oral BID BM  . insulin aspart  0-20 Units Subcutaneous TID WC  . insulin aspart  0-5 Units Subcutaneous QHS  . insulin aspart  18 Units Subcutaneous TID WC  . insulin detemir  42 Units Subcutaneous BID  . levothyroxine  75 mcg Oral Daily  . mouth rinse  15 mL Mouth Rinse q12n4p  . multivitamin with minerals  1 tablet Oral Daily  . nortriptyline  25 mg Oral BID  . pantoprazole  40 mg Oral Daily  . pregabalin  300 mg Oral BID  . Ensure Max Protein  11 oz Oral QHS  . senna-docusate  2 tablet Oral BID  . sodium chloride flush  3 mL Intravenous Q12H  . warfarin  5 mg Oral q1600  . Warfarin - Pharmacist Dosing Inpatient   Does not apply q1600   Continuous Infusions: . sodium chloride Stopped (06/28/20 2035)     LOS: 41 days   2036, MD [facility]  07/21/2020, 11:28 AM

## 2020-07-22 DIAGNOSIS — N493 Fournier gangrene: Secondary | ICD-10-CM | POA: Diagnosis not present

## 2020-07-22 LAB — GLUCOSE, CAPILLARY
Glucose-Capillary: 139 mg/dL — ABNORMAL HIGH (ref 70–99)
Glucose-Capillary: 165 mg/dL — ABNORMAL HIGH (ref 70–99)
Glucose-Capillary: 153 mg/dL — ABNORMAL HIGH (ref 70–99)
Glucose-Capillary: 168 mg/dL — ABNORMAL HIGH (ref 70–99)

## 2020-07-22 LAB — PROTIME-INR
INR: 3.9 — ABNORMAL HIGH (ref 0.8–1.2)
Prothrombin Time: 37.1 seconds — ABNORMAL HIGH (ref 11.4–15.2)

## 2020-07-22 NOTE — Progress Notes (Signed)
NAME:  Carl Hill, MRN:  [id number], DOB:  25-Dec-1970, LOS: 20 ADMISSION DATE:  06/10/2020, CONSULTATION DATE:  06/10/2020 REFERRING MD:  Lorin Mercy, CHIEF COMPLAINT:  Severe Sepsis    Brief History:  50 year old male who presented with buttock pain, fatigue and malaise.  Found to have hyperglycemia. CT pelvis demonstrated necrotizing fasciitis in L buttock, L upper thigh, and perineum. Taken to OR for debridement 1/10 c/b Afib with RVR and VT arrest s/p CPR/defib. Trached 1/26, remains on trach collar   Past Medical History:  HTN, obesity, DM, AFib on [place] Hospital Events:  1/10  Admitted to [facility]. General surgery consulted and debridement pursued. In the afternoon, patient developed hypotension with AF in RVR. Synchronized cardioversion unsuccessful with conversion to VT. CPR initated with ROSC after desynchronized defibrillation. 1/11 Debridement 1/18 Mucus plugging 1/19 Bronchoscopy >> mucus plug on Rt, airway collapse with exhalation 1/20 Persistent fever, change ABx 1/24 Increased vent requirements, habitus related +/- mucus plugging 1/25 Hypotensive to SBP 80s, Levo resumed, minimally responsive overnight off sedation, CT Head negative 1/26 Stable vent/pressor requirements, bronched, tracheostomy 1/29 start hydrotherapy 1/30 start lovenox/coumadin 2/2 Tolerating PS 2/4 did TC for 12 hours 2/5 Did 24 hours TC 2/6 Continue TC  2/7 Try dilaudid with turns/dressing change for better pain control - longer acting, fentanyl IV d/c'd 2/8 Increase standing oxy, dilaudid PRN for turns/dressing changes, downgrade to progressive 2/15 trach changed to 6 cuffless  Consults:  General Surgery ID s/o 1/25  Procedures:  ETT 1/10 >> 1/26 1/26 Tstomy >> R IJ CVL 1/10 >> out L brachial A-line 1/10 >>out   Significant Diagnostic Tests:   CT abd/pelvis 1/10 >> Extensive subcutaneous emphysema in the medial aspect of the upper thighs extending through perineum and superiorly to  the medial left buttock. Gas extends into the pelvis surrounding the anus inferiorly and tracking superiorly along the left wall of the rectum.  CT C/A/P 1/22 >> Imaging limited due to body habitus. No gross fluid collection or gas seen. Bilateral consolidation with dependent atelectasis  CT Head 1/25 >> No acute intracranial abnormality  Micro Data:  COVID/Flu 1/10 >> negative MRSA PCR 1/10 >> negative L buttocks wound 1/10 >> E. Coli (pansensitive), Streptococcus infantarious (sensitive to PCN, ceftriaxone, vanc), Peptostreptococcus species, rare GBS Blood 1/10 >> negative BAL 1/20 >> normal flora Resp Cx 1/26 > Normal flora  Antimicrobials:  Aztreonam 1/09 Metronidazole 1/09 Vancomycin 1/09, 1/11, 1/20 >>1/22 Cefepime 1/10 >> 1/13, 1/20 >>1/22 Clindamycin 1/10 >> 1/13 Ceftriaxone 1/13 >> 1/20 Metronidazole 1/17 >> 1/22 Meropenem 1/22 >> 1/25 Amoxicillin 1/24 (x 1 for PCN Allergy challenge) Unasyn 1/25 >> 1/27 Augmentin 1/27 >>  Interim History / Subjective:   Had a lot of pain this AM.  Received pain meds.  Pulm wise, no complaints.  Says "breathing great and able to cough up stuff if I need"   Objective   Blood pressure 121/67, pulse (!) 108, temperature 98.3 F (36.8 C), temperature source Oral, resp. rate 18, height _0  (1.778 m), weight (!) 240 kg, SpO2 95 %.    FiO2 (%):  [28 %] 28 %   Intake/Output Summary (Last 24 hours) at 07/22/2020 0854 Last data filed at 07/22/2020 0700 Gross per 24 hour  Intake 1240 ml  Output 9380 ml  Net -8140 ml   Filed Weights   07/19/20 0416 07/21/20 0353 07/22/20 0143  Weight: (!) 244.8 kg (!) 243.2 kg (!) 240 kg    General: Morbidly obese male,  resting in bed, in NAD. Neuro: A&O x 3, no deficits. HEENT: [place]/AT. Sclerae anicteric. Trach C/D/I. Cardiovascular: Heart tones distant, RRR, no M/R/G.  Lungs: Respirations even and unlabored.  Breath sounds distant but clear. Abdomen: Morbidly obese, BS x 4, soft, NT/ND.   Musculoskeletal: No gross deformities, 1+ edema Skin: Intact, warm, no rashes.   Resolved problems:  VT arrest 1/10, Ileus 1/15, Elevated LFTs from shock, Metabolic acidosis with lactic acidosis, Septic shock Acute metabolic encephalopathy 2nd to sepsis, hypoxia, hypercapnia.\ Hypernatremia from diuresis and insensible losses - improved   Assessment & Plan:   Acute on chronic hypoxic/hypercapnic respiratory failure in setting of sepsis, HCAP, recurrent mucus plugging  Failure to wean s/p tracheostomy 1/26 - downsized to 6 cuffless on 2/15. Bronchomalacia. Probable sleep disordered breathing. Pt tolerating trach collar 5L 28% day and night, doing well with PMV - Continue ATC and wean O2 as able. - Continue PT / mobility efforts. - No decannulation until can show consistency with mobility and can achieve and maintain moderate to significant weight loss. - Continue SLP. - Continue pulmonary hygiene.  Left buttock necrotizing fasciitis with E coli, Group B Streptococcus, and Peptostreptococcus in wound culture - s/p debridement and multiple rounds of hydrotherapy. Bleeding from sacral site - no further issue. - Surgery following. No further debridement indicated - Long course of antibiotics, currently on augmentin (6 month plan). - Thrombi pads and TMA soaked dressing for bleed.  Acute blood loss anemia, likely from frequent blood draws - Transfused earlier in hospital course, Hgb stable.  Paroxysmal Afib - currently NSR Hx of HLD. - continue amiodarone, lipitor. - coumadin per pharmacy. Started on 1/30.   DM type 2 poorly controlled with hyperglycemia. Hypothyroidism. 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 - Per [facility].   PCCM will continue to follow weekly.   Montey Hora, [facility] For pager details, please see AMION If no response to pager, please call [phone] until 7:00 PM After 7:00 PM, please call Elink at [phone] 07/22/2020, 9:06 AM

## 2020-07-22 NOTE — Progress Notes (Signed)
ANTICOAGULATION CONSULT NOTE  Pharmacy Consult for Warfarin  Indication: atrial fibrillation  Patient Measurements: Height: 5\' 10"  (177.8 cm) Weight: (!) 240 kg (529 lb 1.7 oz) IBW/kg (Calculated) : 73 Heparin Dosing Weight: 149.7 kg  Vital Signs: Temp: 98.3 F (36.8 C) (02/21 0045) Temp Source: Oral (02/21 0045) BP: 121/67 (02/21 0045) Pulse Rate: 108 (02/21 0758)  Labs: Recent Labs    07/21/20 0257 07/22/20 0542  HGB 9.0*  --   HCT 29.1*  --   PLT 243  --   LABPROT  --  37.1*  INR  --  3.9*  CREATININE 0.93  --     Estimated Creatinine Clearance: 192.1 mL/min (by C-G formula based on SCr of 0.93 mg/dL).   Assessment: 49YOM admitted with septic shock secondary to necrotizing fascitis. Was on Xarelto PTA, but that is not appropriate based on patient's weight. Pharmacy has been consulted to dose warfarin.  INR is therapeutic (trending upwards) to 2.9. Warfarin held 2/4-2/7 & 2/10-2/13 for high INR. Noted drug-drug interactions with amiodarone and Augmentin. Eating 100% of meals. Hgb, plt stable. No bleeding noted. Per doses given so far, suspect will maintain on 5mg  daily.   INR increased to 3.9 today. Pt is on amio and Augmentin but not new. We will hold coumadin today and check daily INR.  Goal of Therapy:  INR 2-3 Monitor platelets by anticoagulation protocol: Yes  Plan:  No coumadin today Daily INR and weekly CBC Monitor for s/sx of bleeding  07-20-1970, PharmD, BCIDP, AAHIVP, CPP Infectious Disease Pharmacist 07/22/2020 9:22 AM

## 2020-07-22 NOTE — Significant Event (Signed)
Patient is experiencing uncontrolled pain 10 moaning and tremors gave oxycodone and Dilaudid PRN.

## 2020-07-22 NOTE — Progress Notes (Addendum)
PROGRESS NOTE    Carl Hill  VOZ:[id number] DOB: 1971-01-30 DOA: 06/10/2020 PCP: Malka So., MD   Chief Complaint  Patient presents with  . Hyperglycemia    Brief Narrative:  50 year old male with piror h/o HTN, obesity, DM, AFib on DOAC presented with buttock pain, malaise. CT pelvis showed demonstrated necrotizing fasciitis in L buttock, L upper thigh, and perineum. Taken to OR for debridement 1/10, complicated by Afib with RVR and VT arrest s/p CPR/defib.  Prolonged mechanical ventilation, eventually trached on 1/26,.  Currently on trach collar. He was transferred to progressive care to [facility] on 07/10/20.  -Trach was downgraded to #6 cuffless Shiley on 2/15, per  Pulm no plans for decannulation until has increased mobility or weight loss.  Significant Hospital Events:  1/10  Admitted to [facility] with sepsis, necrotizing fasciitis. General surgery consulted and debridement pursued. In the afternoon, patient developed hypotension with AF in RVR. Synchronized cardioversion unsuccessful with conversion to VT. CPR initated with ROSC after desynchronized defibrillation. 1/11 Debridement 1/18 Mucus plugging 1/19 Bronchoscopy >> mucus plug on Rt, airway collapse with exhalation 1/20 Persistent fever, change ABx 1/24 Increased vent requirements, habitus related +/- mucus plugging 1/25 Hypotensive to SBP 80s, Levo resumed, minimally responsive overnight off sedation, CT Head negative 1/26 Stable vent/pressor requirements, bronched, tracheostomy 1/29 start hydrotherapy 1/30 start lovenox/coumadin 2/2 Tolerating PS 2/4 did TC for 12 hours 2/5 Did 24 hours TC 2/6 Continue TC  2/7 Try dilaudid with turns/dressing change for better pain control - longer acting, fentanyl IV d/c'd 2/8 Increase standing oxy, dilaudid PRN for turns/dressing changes, downgrade to progressive 2/15 trach changed to # 6 cuffless   Assessment & Plan:    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  failure in the setting of sepsis, morbid obesity, health care associated pneumonia, recurrent mucus plugging:  -Prolonged respiratory failure requiring mechanical ventilation, failed attempts at extubation  -Status post tracheostomy on 1/26, weaned off ventilator -Remains stable on trach collar, PCCM following peripherally -Trach was downgraded to cuffless #6 Shiley on 2/15 -SLP following -Continue routine trach care, per pulmonary no plans for decannulation -Overall remains stable, difficult placement  -Continue aggressive PT as tolerated  Left buttock necrotizing fascitis with E coli , Group B strep, peptostreptococcus in wound culture -Excisional debridement of perianal, gluteal and perineal necrotizing soft tissue infection by Dr. Freida Busman 1/10 -Back to the OR 1/11 and excisional debridement of perineal necrosis, Dr.Allen -followed by hydrotherapy 06/29/20 till 07/04/20 No further debridement. General surgery recommended wound care to follow up.  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   Type 2 diabetes mellitus  SLP eval recommending regular diet with thin liquid.  Currently on 42 units BID, Increased novolog to 18 units TIDAC.  -Stable  Acute blood loss anemia; from blood loss from wound.  S/p PRBC transfusion. -Hemoglobin is relatively stable  Hypothyroidism:  Continue with synthroid.   Atrial fibrillation:  -Heart rate improving, continue amiodarone and diltiazem -Was on Xarelto prior to admission, this was changed to Coumadin in the ICU per pharmacy as Xarelto was not felt to be safe for body weight > 500 pounds  Pressure injury present on admission:  Pressure Injury 06/10/20 Heel Right Stage 2 -  Partial thickness loss of dermis presenting as a shallow open injury with a red, pink wound bed  without slough. (  Active)  06/10/20 1400  Location: Heel  Location Orientation: Right  Staging: Stage 2 -  Partial thickness loss of dermis presenting as a shallow open injury with a red, pink wound bed without slough.  Wound Description (Comments):   Present on Admission: Yes   Wound care reconsulted .   Hyperlipidemia:  Resume lipitor.   Intermittent blurry vision, could be related to hypotension, vs narcotics , also happens at home, -Now improved  DVT prophylaxis: (Warfarin) Code Status: (Full Code) Family Communication: none at bedside.   Disposition:   Status is: Inpatient  Remains inpatient appropriate because:Unsafe d/c plan and Inpatient level of care appropriate due to severity of illness   Dispo: The patient is from: Home              Anticipated d/c is to: SNF              Anticipated d/c date is: > 3 days              Patient currently is medically stable to d/c.   Difficult to place patient Yes  Level of care: Progressive   Consultants:   General surgery.   ID  PCCM.    Procedures:  ETT 1/10 >>  R IJ CVL 1/10 >>  L brachial A-line 1/10 >>  RECTAL TUBE.  S/P TRACH  ON 1/26 6 CUFFLESS ON 2/15   Antimicrobials: Aztreonam 1/09 Metronidazole 1/09 Vancomycin 1/09, 1/11, 1/20 >>1/22 Cefepime 1/10 >> 1/13, 1/20 >>1/22 Clindamycin 1/10 >> 1/13 Ceftriaxone 1/13 >> 1/20 Metronidazole 1/17 >> 1/22 Meropenem 1/22 >> 1/25 Amoxicillin 1/24 (x 1 for PCN Allergy challenge) Unasyn 1/25 >> 1/27 Augmentin 1/27 >>  Subjective: -Had more pain on his bottom this morning when being repositioned and after rectal tube tube was changed  Objective: Vitals:   07/22/20 0143 07/22/20 0418 07/22/20 0758 07/22/20 1005  BP:      Pulse:  99 (!) 108 (!) 104  Resp:  19 18   Temp:      TempSrc:      SpO2:  95% 95% 91%  Weight: (!) 240 kg     Height:        Intake/Output Summary (Last 24 hours) at 07/22/2020 1026 Last data filed at 07/22/2020 0700 Gross per 24  hour  Intake 760 ml  Output 9380 ml  Net -8620 ml   Filed Weights   07/19/20 0416 07/21/20 0353 07/22/20 0143  Weight: (!) 244.8 kg (!) 243.2 kg (!) 240 kg    Examination:  General exam: Morbidly obese chronically ill male laying in bed, AAOx3, no distress HEENT: Tracheostomy with trach collar CVS: S1-S2, RRR Lungs: Few conducted upper airway sounds otherwise clear Abdomen: Soft, obese, nontender, bowel sounds present Extremities: Dry scaly skin of lower extremities with hyperpigmentation Skin, bilateral sacral decubitus wounds with dressing  Data Reviewed: I have personally reviewed following labs and imaging studies  CBC: Recent Labs  Lab 07/17/20 0342 07/18/20 0259 07/19/20 0314 07/21/20 0257  WBC 6.9 7.6 6.8 7.3  HGB 9.1* 8.5* 8.6* 9.0*  HCT 30.2* 27.0* 27.1* 29.1*  MCV 87.5 87.4 86.6 88.2  PLT 208 211 212 243    Basic Metabolic Panel: Recent Labs  Lab 07/17/20 0342 07/18/20 0259 07/19/20 0314 07/21/20 0257  NA 134* 135 133* 138  K 3.6 3.6 3.2* 4.1  CL 93* 92* 92* 92*  CO2 30 32 31 34*  GLUCOSE 151* 150* 144* 131*  BUN 11 11 8 8   CREATININE 0.91  1.01 1.01 0.93  CALCIUM 8.7* 8.5* 8.4* 8.8*    GFR: Estimated Creatinine Clearance: 192.1 mL/min (by C-G formula based on SCr of 0.93 mg/dL).  Liver Function Tests: No results for input(s): AST, ALT, ALKPHOS, BILITOT, PROT, ALBUMIN in the last 168 hours.  CBG: Recent Labs  Lab 07/21/20 0921 07/21/20 1128 07/21/20 1616 07/21/20 2114 07/22/20 0617  GLUCAP 199* 187* 143* 119* 139*     No results found for this or any previous visit (from the past 240 hour(s)).   Scheduled Meds: . amiodarone  200 mg Oral Daily  . amoxicillin-clavulanate  1 tablet Oral Q8H  . atorvastatin  20 mg Oral QPM  . chlorhexidine  15 mL Mouth Rinse BID  . Chlorhexidine Gluconate Cloth  6 each Topical Daily  . collagenase  1 application Topical Daily  . diltiazem  240 mg Oral Daily  . feeding supplement  237 mL Oral BID BM   . insulin aspart  0-20 Units Subcutaneous TID WC  . insulin aspart  0-5 Units Subcutaneous QHS  . insulin aspart  18 Units Subcutaneous TID WC  . insulin detemir  42 Units Subcutaneous BID  . levothyroxine  75 mcg Oral Daily  . mouth rinse  15 mL Mouth Rinse q12n4p  . multivitamin with minerals  1 tablet Oral Daily  . nortriptyline  25 mg Oral BID  . pantoprazole  40 mg Oral Daily  . pregabalin  300 mg Oral BID  . Ensure Max Protein  11 oz Oral QHS  . senna-docusate  2 tablet Oral BID  . sodium chloride flush  3 mL Intravenous Q12H  . Warfarin - Pharmacist Dosing Inpatient   Does not apply q1600   Continuous Infusions: . sodium chloride Stopped (06/28/20 2035)    LOS: 42 days   Zannie Cove, MD [facility]  07/22/2020, 10:26 AM

## 2020-07-22 NOTE — Progress Notes (Signed)
Occupational Therapy Treatment Patient Details Name: Carl Hill MRN: [id number] DOB: 1970-07-14 Today's Date: 07/22/2020    History of present illness Pt is a 50 year old male who presented with buttock pain, fatigue and malaise.  Found to have hyperglycemia. CT pelvis demonstrated necrotizing fasciitis in L buttock, L upper thigh, and perineum. Taken to OR for debridement 1/10 c/b Afib with RVR and VT arrest s/p CPR/defib. Trached 1/26, remains on full vent support. PMHx: HTN, obesity, DM, AFib.  As of 07/04/20 started TC trials.   OT comments  Session limited today by pain. Pt moaning with increased RR duet opian @ rectum/buttocks. Discussed with nsg who gave additional pain meds however pt declined to attempt tilt due to pain. Discussed pain managemetn with nsg as OT/PT see pt daily @ 8:30 with Kreg bed rep. Session focused on bed level exercise mostly in L sidelying. Encouraged pt to increase HOB to work on BP tolerance for standing. Will continue to follow acutely.   Follow Up Recommendations  CIR;Supervision/Assistance - 24 hour    Equipment Recommendations  Wheelchair (measurements OT);Wheelchair cushion (measurements OT);Hospital bed;3 in 1 bedside commode    Recommendations for Other Services Rehab consult    Precautions / Restrictions Precautions Precautions: Fall Precaution Comments: TC (cuffless now), rectal tube, large buttocks wound, hydro on hold; PMSV       Mobility Bed Mobility               General bed mobility comments: Reports being able to roll independently  Transfers                      Balance                                           ADL either performed or assessed with clinical judgement   ADL                                         General ADL Comments: Pt reports he has been trying to do more of his ADL tasks. Is feeding himself without the red tubing usually. Able to reach behind back wtih  RUE in preparation for pericare once wound heals. Would most likely benefit form toilet want. Pt apparently has some type of Bidet at home.     Vision       Perception     Praxis      Cognition Arousal/Alertness: Awake/alert Behavior During Therapy: Anxious Overall Cognitive Status: Within Functional Limits for tasks assessed                                          Exercises General Exercises - Upper Extremity Shoulder Flexion: Both;20 reps;Strengthening;Theraband;Sidelying Theraband Level (Shoulder Flexion): Level 2 (Red) Shoulder Extension: Strengthening;Both;20 reps;Sidelying;Theraband Theraband Level (Shoulder Extension): Level 2 (Red) Shoulder ABduction: Strengthening;Right;20 reps;Sidelying Elbow Flexion: Strengthening;Both;20 reps;Sidelying;Theraband Theraband Level (Elbow Flexion): Level 2 (Red) Elbow Extension: Strengthening;Both;20 reps;Sidelying;Theraband Theraband Level (Elbow Extension): Level 2 (Red) Digit Composite Flexion: Squeeze ball Other Exercises Other Exercises: reaching cross midline with RUE to incorporate trunk x 20 (L sidelying) Other Exercises: theraputty - yellow - focus on pinch strengthening   Shoulder  Instructions       General Comments      Pertinent Vitals/ Pain       Pain Assessment: Faces Faces Pain Scale: Hurts worst Pain Location: rectum/buttocks Pain Descriptors / Indicators: Burning;Grimacing;Guarding;Moaning Pain Intervention(s): Limited activity within patient's tolerance;Premedicated before session;Patient requesting pain meds-RN notified;Relaxation  Home Living                                          Prior Functioning/Environment              Frequency  Min 2X/week        Progress Toward Goals  OT Goals(current goals can now be found in the care plan section)  Progress towards OT goals: Progressing toward goals  Acute Rehab OT Goals Patient Stated Goal: to get stronger,  try sitting up EOB, get back to walking with RW OT Goal Formulation: With patient Time For Goal Achievement: 08/05/20 Potential to Achieve Goals: Good ADL Goals Pt Will Perform Eating: with modified independence;with adaptive utensils Pt Will Perform Grooming: with set-up;sitting Pt Will Perform Upper Body Bathing: with set-up;bed level Pt Will Perform Lower Body Bathing: with mod assist;bed level;with adaptive equipment Pt Will Perform Upper Body Dressing: with set-up;sitting Pt/caregiver will Perform Home Exercise Program: Increased strength;Both right and left upper extremity;With Supervision;With theraband Additional ADL Goal #1: Pt will tolerate seated position x 10 min in preparation for ADL tasks Additional ADL Goal #2: Pt will tolerate x5 mins of unsupported sitting for dynamic sitting balance tasks with O2 >90%. Additional ADL Goal #3: Pt will tolerate standing @ 60 degrees x 5 min in preparation for ADL tasks and mobility  Plan Discharge plan remains appropriate    Co-evaluation                 AM-PAC OT "6 Clicks" Daily Activity     Outcome Measure   Help from another person eating meals?: A Little Help from another person taking care of personal grooming?: A Little Help from another person toileting, which includes using toliet, bedpan, or urinal?: Total Help from another person bathing (including washing, rinsing, drying)?: A Lot Help from another person to put on and taking off regular upper body clothing?: A Lot Help from another person to put on and taking off regular lower body clothing?: Total 6 Click Score: 12    End of Session    OT Visit Diagnosis: Unsteadiness on feet (R26.81);Muscle weakness (generalized) (M62.81);Pain Pain - part of body:  (buttocks/rectum)   Activity Tolerance Patient limited by pain   Patient Left in bed;with call bell/phone within reach;Other (comment) (L sidelying)   Nurse Communication Mobility status;Patient requests pain  meds        Time: 1950-9326 OT Time Calculation (min): 40 min  Charges: OT General Charges $OT Visit: 1 Visit OT Treatments $Self Care/Home Management : 8-22 mins $Therapeutic Activity: 8-22 mins $Therapeutic Exercise: 8-22 mins  Luisa Dago, OT/L   Acute OT Clinical Specialist Acute Rehabilitation Services Pager [phone] Office [phone]    [facility] 07/22/2020, 9:35 AM

## 2020-07-22 NOTE — Progress Notes (Signed)
Dressing changes complete. Patient tolerated well. Will continue to monitor.

## 2020-07-23 DIAGNOSIS — N493 Fournier gangrene: Secondary | ICD-10-CM | POA: Diagnosis not present

## 2020-07-23 LAB — BASIC METABOLIC PANEL
Anion gap: 12 (ref 5–15)
BUN: 9 mg/dL (ref 6–20)
CO2: 31 mmol/L (ref 22–32)
Calcium: 8.4 mg/dL — ABNORMAL LOW (ref 8.9–10.3)
Chloride: 92 mmol/L — ABNORMAL LOW (ref 98–111)
Creatinine, Ser: 0.96 mg/dL (ref 0.61–1.24)
GFR, Estimated: >60 mL/min (ref >60)
Glucose, Bld: 159 mg/dL — ABNORMAL HIGH (ref 70–99)
Potassium: 3.3 mmol/L — ABNORMAL LOW (ref 3.5–5.1)
Sodium: 135 mmol/L (ref 135–145)

## 2020-07-23 LAB — CBC
HCT: 28 % — ABNORMAL LOW (ref 39.0–52.0)
Hemoglobin: 9 g/dL — ABNORMAL LOW (ref 13.0–17.0)
MCH: 27.8 pg (ref 26.0–34.0)
MCHC: 32.1 g/dL (ref 30.0–36.0)
MCV: 86.4 fL (ref 80.0–100.0)
Platelets: 244 10*3/uL (ref 150–400)
RBC: 3.24 MIL/uL — ABNORMAL LOW (ref 4.22–5.81)
RDW: 16.4 % — ABNORMAL HIGH (ref 11.5–15.5)
WBC: 7 10*3/uL (ref 4.0–10.5)
nRBC: 0 % (ref 0.0–0.2)

## 2020-07-23 LAB — PROTIME-INR
INR: 3.6 — ABNORMAL HIGH (ref 0.8–1.2)
Prothrombin Time: 34.4 seconds — ABNORMAL HIGH (ref 11.4–15.2)

## 2020-07-23 LAB — GLUCOSE, CAPILLARY
Glucose-Capillary: 161 mg/dL — ABNORMAL HIGH (ref 70–99)
Glucose-Capillary: 127 mg/dL — ABNORMAL HIGH (ref 70–99)
Glucose-Capillary: 160 mg/dL — ABNORMAL HIGH (ref 70–99)
Glucose-Capillary: 136 mg/dL — ABNORMAL HIGH (ref 70–99)

## 2020-07-23 MED ORDER — POTASSIUM CHLORIDE CRYS ER 20 MEQ PO TBCR
40.0000 meq | EXTENDED_RELEASE_TABLET | Freq: Once | ORAL | Status: AC
  Administered 2020-07-23: 40 meq via ORAL
  Filled 2020-07-23: qty 2

## 2020-07-23 NOTE — Progress Notes (Signed)
PROGRESS NOTE    Carl Hill  VQQ:[id number] DOB: [birth year] DOA: 06/10/2020 PCP: Malka So., MD   Chief Complaint  Patient presents with  . Hyperglycemia    Brief Narrative:  50 year old male with piror h/o HTN, obesity, DM, AFib on DOAC presented with buttock pain, malaise. CT pelvis showed demonstrated necrotizing fasciitis in L buttock, L upper thigh, and perineum. Taken to OR for debridement 1/10, complicated by Afib with RVR and VT arrest s/p CPR/defib.  Prolonged mechanical ventilation, eventually trached on 1/26,.  Currently on trach collar. He was transferred to progressive care to [facility] on 07/10/20.  -Trach was downgraded to #6 cuffless Shiley on 2/15, per  Pulm no plans for decannulation until has increased mobility or weight loss. -Remains stable overall, difficult placement due to morbid obesity, new trach etc.  Significant Hospital Events:  1/10  Admitted to [facility] with sepsis, necrotizing fasciitis. General surgery consulted and debridement pursued. In the afternoon, patient developed hypotension with AF in RVR. Synchronized cardioversion unsuccessful with conversion to VT. CPR initated with ROSC after desynchronized defibrillation. 1/11 Debridement 1/18 Mucus plugging 1/19 Bronchoscopy >> mucus plug on Rt, airway collapse with exhalation 1/20 Persistent fever, change ABx 1/24 Increased vent requirements, habitus related +/- mucus plugging 1/25 Hypotensive to SBP 80s, Levo resumed, minimally responsive overnight off sedation, CT Head negative 1/26 Stable vent/pressor requirements, bronched, tracheostomy 1/29 start hydrotherapy 1/30 start lovenox/coumadin 2/2 Tolerating PS 2/4 did TC for 12 hours 2/5 Did 24 hours TC 2/6 Continue TC  2/7 Try dilaudid with turns/dressing change for better pain control - longer acting, fentanyl IV d/c'd 2/8 Increase standing oxy, dilaudid PRN for turns/dressing changes, downgrade to progressive 2/15 trach changed to # 6 cuffless    Assessment & Plan:    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 failure in the setting of sepsis, morbid obesity, health care associated pneumonia, recurrent mucus plugging:  -Prolonged respiratory failure requiring mechanical ventilation, failed attempts at extubation  -S/p tracheostomy on 1/26, weaned off ventilator -Remains stable on trach collar, PCCM following peripherally -Trach was downgraded to cuffless #6 Shiley on 2/15 -SLP following -Continue routine trach care, per pulmonary no plans for decannulation, overall remains stable but continues to be difficult to place  -Continue aggressive PT as tolerated   Left buttock necrotizing fascitis with E coli , Group B strep, peptostreptococcus in wound culture -Excisional debridement of perianal, gluteal and perineal necrotizing soft tissue infection by Dr. Freida Busman 1/10 -Back to the OR 1/11 and excisional debridement of perineal necrosis, Dr.Allen -followed by hydrotherapy 06/29/20 till 07/04/20 -No further debridement. General surgery recommended wound care to follow up.  -Seen by infectious disease who recommended Unasyn followed by Augmentin for 4-month course due to actinomyces in the wound -Continue continue wound care/dressing changes  Hypernatremia from diuresis and insensible losses:  Resolved.   Type 2 diabetes mellitus  SLP eval recommending regular diet with thin liquid.  Currently on 42 units BID, Increased novolog to 18 units TIDAC.  -Stable  Acute blood loss anemia; from blood loss from wound.  S/p PRBC transfusion. -Hemoglobin is relatively stable  Hypothyroidism:  Continue with synthroid.   Atrial fibrillation:  -Heart rate improving, continue amiodarone and diltiazem -Was on Xarelto prior to admission, this was changed to Coumadin in the ICU per pharmacy as Xarelto was not felt to be safe for body weight > 500 pounds -Continue Coumadin per pharmacy  Pressure injury present on admission:  Pressure  Injury 06/10/20 Heel Right Stage  2 -  Partial thickness loss of dermis presenting as a shallow open injury with a red, pink wound bed without slough. (Active)  06/10/20 1400  Location: Heel  Location Orientation: Right  Staging: Stage 2 -  Partial thickness loss of dermis presenting as a shallow open injury with a red, pink wound bed without slough.  Wound Description (Comments):   Present on Admission: Yes   Wound care reconsulted .   Hyperlipidemia:  Resume lipitor.   Intermittent blurry vision, could be related to hypotension, vs narcotics , also happens at home, -Now improved  DVT prophylaxis: (Warfarin) Code Status: (Full Code) Family Communication:  mother at bedside Disposition:   Status is: Inpatient  Remains inpatient appropriate because: Difficult placement   Dispo: The patient is from: Home              Anticipated d/c is to: SNF              Anticipated d/c date is: > 3 days              Patient currently is medically stable to d/c.   Difficult to place patient Yes  Level of care: Progressive   Consultants:   General surgery.   ID  PCCM.    Procedures:  ETT 1/10 >>  R IJ CVL 1/10 >>  L brachial A-line 1/10 >>  RECTAL TUBE.  S/P TRACH  ON 1/26 6 CUFFLESS ON 2/15   Antimicrobials: Aztreonam 1/09 Metronidazole 1/09 Vancomycin 1/09, 1/11, 1/20 >>1/22 Cefepime 1/10 >> 1/13, 1/20 >>1/22 Clindamycin 1/10 >> 1/13 Ceftriaxone 1/13 >> 1/20 Metronidazole 1/17 >> 1/22 Meropenem 1/22 >> 1/25 Amoxicillin 1/24 (x 1 for PCN Allergy challenge) Unasyn 1/25 >> 1/27 Augmentin 1/27 >>  Subjective: -Having some discomfort in his bottom after rectal tube was changed, denies any dyspnea, about to be tilted up in bed with therapy Objective: Vitals:   07/23/20 0537 07/23/20 0752 07/23/20 1128 07/23/20 1145  BP: 105/61     Pulse: 99 (!) 109 (!) 104 100  Resp: 18 20 (!) 22 20  Temp: 97.8 F (36.6 C)     TempSrc: Oral     SpO2: 95% 97% 96% 92%  Weight: (!)  245.2 kg     Height:        Intake/Output Summary (Last 24 hours) at 07/23/2020 1218 Last data filed at 07/23/2020 0915 Gross per 24 hour  Intake 960 ml  Output 4800 ml  Net -3840 ml   Filed Weights   07/21/20 0353 07/22/20 0143 07/23/20 0537  Weight: (!) 243.2 kg (!) 240 kg (!) 245.2 kg    Examination:  General exam: Morbidly obese chronically ill male laying in bed, AAOx3, no distress HEENT: Tracheostomy with trach collar CVS: S1-S2, regular rate rhythm Lungs: Few conducted upper airway sounds, distant breath sounds otherwise clear Abdomen: Soft, obese, nontender, bowel sounds present morbidly obese Extremities: Dry scaly skin of lower extremities with hyperpigmentation Skin, bilateral sacral decubitus wounds with dressing  Data Reviewed: I have personally reviewed following labs and imaging studies  CBC: Recent Labs  Lab 07/17/20 0342 07/18/20 0259 07/19/20 0314 07/21/20 0257 07/23/20 0253  WBC 6.9 7.6 6.8 7.3 7.0  HGB 9.1* 8.5* 8.6* 9.0* 9.0*  HCT 30.2* 27.0* 27.1* 29.1* 28.0*  MCV 87.5 87.4 86.6 88.2 86.4  PLT 208 211 212 243 244    Basic Metabolic Panel: Recent Labs  Lab 07/17/20 0342 07/18/20 0259 07/19/20 0314 07/21/20 0257 07/23/20 0253  NA 134* 135 133*  138 135  K 3.6 3.6 3.2* 4.1 3.3*  CL 93* 92* 92* 92* 92*  CO2 30 32 31 34* 31  GLUCOSE 151* 150* 144* 131* 159*  BUN 11 11 8 8 9   CREATININE 0.91 1.01 1.01 0.93 0.96  CALCIUM 8.7* 8.5* 8.4* 8.8* 8.4*    GFR: Estimated Creatinine Clearance: 188.9 mL/min (by C-G formula based on SCr of 0.96 mg/dL).  Liver Function Tests: No results for input(s): AST, ALT, ALKPHOS, BILITOT, PROT, ALBUMIN in the last 168 hours.  CBG: Recent Labs  Lab 07/22/20 1132 07/22/20 1551 07/22/20 2147 07/23/20 0625 07/23/20 1155  GLUCAP 165* 153* 168* 161* 127*     No results found for this or any previous visit (from the past 240 hour(s)).   Scheduled Meds: . amiodarone  200 mg Oral Daily  .  amoxicillin-clavulanate  1 tablet Oral Q8H  . atorvastatin  20 mg Oral QPM  . chlorhexidine  15 mL Mouth Rinse BID  . Chlorhexidine Gluconate Cloth  6 each Topical Daily  . collagenase  1 application Topical Daily  . diltiazem  240 mg Oral Daily  . feeding supplement  237 mL Oral BID BM  . insulin aspart  0-20 Units Subcutaneous TID WC  . insulin aspart  0-5 Units Subcutaneous QHS  . insulin aspart  18 Units Subcutaneous TID WC  . insulin detemir  42 Units Subcutaneous BID  . levothyroxine  75 mcg Oral Daily  . mouth rinse  15 mL Mouth Rinse q12n4p  . multivitamin with minerals  1 tablet Oral Daily  . nortriptyline  25 mg Oral BID  . pantoprazole  40 mg Oral Daily  . pregabalin  300 mg Oral BID  . Ensure Max Protein  11 oz Oral QHS  . senna-docusate  2 tablet Oral BID  . sodium chloride flush  3 mL Intravenous Q12H  . Warfarin - Pharmacist Dosing Inpatient   Does not apply q1600   Continuous Infusions: . sodium chloride Stopped (06/28/20 2035)    LOS: 43 days   2036, MD [facility]  07/23/2020, 12:18 PM

## 2020-07-23 NOTE — Progress Notes (Signed)
ANTICOAGULATION CONSULT NOTE  Pharmacy Consult for Warfarin  Indication: atrial fibrillation  Patient Measurements: Height: 5\' 10"  (177.8 cm) Weight: (!) 245.2 kg (540 lb 9.1 oz) IBW/kg (Calculated) : 73 Heparin Dosing Weight: 149.7 kg  Vital Signs: Temp: 97.8 F (36.6 C) (02/22 0537) Temp Source: Oral (02/22 0537) BP: 105/61 (02/22 0537) Pulse Rate: 109 (02/22 0752)  Labs: Recent Labs    07/21/20 0257 07/22/20 0542 07/23/20 0253  HGB 9.0*  --  9.0*  HCT 29.1*  --  28.0*  PLT 243  --  244  LABPROT  --  37.1* 34.4*  INR  --  3.9* 3.6*  CREATININE 0.93  --  0.96    Estimated Creatinine Clearance: 188.9 mL/min (by C-G formula based on SCr of 0.96 mg/dL).   Assessment: 49YOM admitted with septic shock secondary to necrotizing fascitis. Was on Xarelto PTA, but that is not appropriate based on patient's weight. Pharmacy has been consulted to dose warfarin.  INR is therapeutic (trending upwards) to 2.9. Warfarin held 2/4-2/7 & 2/10-2/13 for high INR. Noted drug-drug interactions with amiodarone and Augmentin. Eating 100% of meals. Hgb, plt stable. No bleeding noted. Per doses given so far, suspect will maintain on 5mg  daily.   INR trended down to 3.6 today. Pt is on amio and Augmentin but not new. We will hold coumadin again today and check daily INR.  Goal of Therapy:  INR 2-3 Monitor platelets by anticoagulation protocol: Yes  Plan:  No coumadin today Daily INR and weekly CBC Monitor for s/sx of bleeding  07-20-1970, PharmD, BCIDP, AAHIVP, CPP Infectious Disease Pharmacist 07/23/2020 9:14 AM

## 2020-07-23 NOTE — Progress Notes (Signed)
Physical Therapy Treatment Patient Details Name: Carl Hill MRN: [id number] DOB: [birth year] Today's Date: 07/23/2020    History of Present Illness Pt is a 50 year old male who presented with buttock pain, fatigue and malaise.  Found to have hyperglycemia. CT pelvis demonstrated necrotizing fasciitis in L buttock, L upper thigh, and perineum. Taken to OR for debridement 1/10 c/b Afib with RVR and VT arrest s/p CPR/defib. Trached 1/26, remains on full vent support. PMHx: HTN, obesity, DM, AFib.  As of 07/04/20 started TC trials.    PT Comments    Pt continues to have difficulty with tolerating bed tilt of 50 degrees for longer than 3 minutes due to symptomatic drop in BP with c/o dizziness and nausea.  Pt able to tolerate 15 minutes total of bed tilt 40 degrees while performing therapeutic exercise with theraband. Trouble shooting how to increase tolerance to increased tilt. Will work with Vernie Murders rep and pt to determine if use of Trendelenburg/reverse trendelenberg by pt in sidelying throughout the day.   Follow Up Recommendations  CIR     Equipment Recommendations  Wheelchair (measurements PT);Wheelchair cushion (measurements PT);Hospital bed;Other (comment);3in1 (PT) (hoyer lift, bariatric equipment)    Recommendations for Other Services Rehab consult     Precautions / Restrictions Precautions Precautions: Fall Precaution Comments: TC (cuffless now), rectal tube, large buttocks wound, hydro on hold; PMSV Restrictions Weight Bearing Restrictions: No    Mobility  Bed Mobility Overal bed mobility: Needs Assistance Bed Mobility: Rolling Rolling: Supervision         General bed mobility comments: supervision and extra time needed, heavy use of rail. Start Time: 0903 Angle: 50 degrees (max angle) Total Minutes in Angle: 15 minutes Patient Response: Cooperative               Cognition Arousal/Alertness: Awake/alert Behavior During Therapy: WFL for tasks  assessed/performed Overall Cognitive Status: Within Functional Limits for tasks assessed                                 General Comments: Anxiety has decreased since building rapport with therapists.      Exercises General Exercises - Upper Extremity Shoulder Horizontal ABduction: AROM;Both;20 reps;Theraband Theraband Level (Shoulder Horizontal Abduction): Level 2 (Red) Elbow Flexion: AROM;Both;20 reps;Theraband Theraband Level (Elbow Flexion): Level 2 (Red) General Exercises - Lower Extremity Ankle Circles/Pumps: AROM;Both;20 reps Quad Sets: AROM;Both;20 reps (pt with back pain, cued for less reps and better form) Other Exercises Other Exercises: bridging x 5    General Comments General comments (skin integrity, edema, etc.): prior to lift BP 144/84 in max tilt of 50 degrees after 3 min became nauseous and dizzy, BHR 138 bpm, and BP 95/69      Pertinent Vitals/Pain Faces Pain Scale: Hurts even more Pain Location: rectum and buttocks Pain Descriptors / Indicators: Guarding;Burning Pain Intervention(s): Limited activity within patient's tolerance;Monitored during session;Repositioned     PT Goals (current goals can now be found in the care plan section) Acute Rehab PT Goals Patient Stated Goal: to get stronger, try sitting up EOB, get back to walking with RW PT Goal Formulation: With patient Time For Goal Achievement: 07/29/20 Potential to Achieve Goals: Fair Progress towards PT goals: Progressing toward goals    Frequency    Min 3X/week      PT Plan Current plan remains appropriate  AM-PAC PT "6 Clicks" Mobility   Outcome Measure  Help needed turning from your back to your side while in a flat bed without using bedrails?: A Little Help needed moving from lying on your back to sitting on the side of a flat bed without using bedrails?: Total Help needed moving to and from a bed to a chair (including a wheelchair)?: Total Help  needed standing up from a chair using your arms (e.g., wheelchair or bedside chair)?: Total Help needed to walk in hospital room?: Total Help needed climbing 3-5 steps with a railing? : Total 6 Click Score: 8    End of Session Equipment Utilized During Treatment: Oxygen (28% TC, 3.5 L) Activity Tolerance: Treatment limited secondary to medical complications (Comment);Patient limited by pain (symptomatic BP drop with tilt of 50 degrees) Patient left: in bed;with call bell/phone within reach;with family/visitor present Nurse Communication: Mobility status PT Visit Diagnosis: Muscle weakness (generalized) (M62.81);Other abnormalities of gait and mobility (R26.89)     Time: 6063-0160 PT Time Calculation (min) (ACUTE ONLY): 65 min  Charges:  $Therapeutic Exercise: 23-37 mins $Therapeutic Activity: 23-37 mins                     Anelise Staron B. Beverely Risen PT, DPT Acute Rehabilitation Services Pager [phone] Office [phone]    [facility]on Alas [facility] 07/23/2020, 1:43 PM

## 2020-07-23 NOTE — TOC Progression Note (Signed)
Transition of Care [facility]) - Progression Note    Patient Details  Name: Carl Hill MRN: [id number] Date of Birth: [birth year]  Transition of Care [facility]) CM/SW Contact  Erin Sons, [place] Phone Number: 07/23/2020, 3:38 PM  Clinical Narrative:     CSW called pt's mom and provided her an update with disposition. CSW explained barriers to finding facility and informed pt's mom that [facility] leadership is working on finding solutions to d/c barriers.   Expected Discharge Plan: Skilled Nursing Facility Barriers to Discharge: Continued Medical Work up,SNF Pending bed offer,Inadequate or no insurance  Expected Discharge Plan and Services Expected Discharge Plan: Skilled Nursing Facility     Post Acute Care Choice: Skilled Nursing Facility                                         Social Determinants of Health (SDOH) Interventions    Readmission Risk Interventions No flowsheet data found.

## 2020-07-24 DIAGNOSIS — N493 Fournier gangrene: Secondary | ICD-10-CM | POA: Diagnosis not present

## 2020-07-24 LAB — BASIC METABOLIC PANEL
Anion gap: 12 (ref 5–15)
BUN: 8 mg/dL (ref 6–20)
CO2: 30 mmol/L (ref 22–32)
Calcium: 8.3 mg/dL — ABNORMAL LOW (ref 8.9–10.3)
Chloride: 94 mmol/L — ABNORMAL LOW (ref 98–111)
Creatinine, Ser: 0.98 mg/dL (ref 0.61–1.24)
GFR, Estimated: >60 mL/min (ref >60)
Glucose, Bld: 138 mg/dL — ABNORMAL HIGH (ref 70–99)
Potassium: 3.4 mmol/L — ABNORMAL LOW (ref 3.5–5.1)
Sodium: 136 mmol/L (ref 135–145)

## 2020-07-24 LAB — CBC
HCT: 27.7 % — ABNORMAL LOW (ref 39.0–52.0)
Hemoglobin: 8.5 g/dL — ABNORMAL LOW (ref 13.0–17.0)
MCH: 27.1 pg (ref 26.0–34.0)
MCHC: 30.7 g/dL (ref 30.0–36.0)
MCV: 88.2 fL (ref 80.0–100.0)
Platelets: 258 10*3/uL (ref 150–400)
RBC: 3.14 MIL/uL — ABNORMAL LOW (ref 4.22–5.81)
RDW: 16.7 % — ABNORMAL HIGH (ref 11.5–15.5)
WBC: 6.9 10*3/uL (ref 4.0–10.5)
nRBC: 0 % (ref 0.0–0.2)

## 2020-07-24 LAB — GLUCOSE, CAPILLARY
Glucose-Capillary: 126 mg/dL — ABNORMAL HIGH (ref 70–99)
Glucose-Capillary: 134 mg/dL — ABNORMAL HIGH (ref 70–99)
Glucose-Capillary: 143 mg/dL — ABNORMAL HIGH (ref 70–99)
Glucose-Capillary: 116 mg/dL — ABNORMAL HIGH (ref 70–99)

## 2020-07-24 LAB — PROTIME-INR
INR: 3.1 — ABNORMAL HIGH (ref 0.8–1.2)
Prothrombin Time: 30.7 seconds — ABNORMAL HIGH (ref 11.4–15.2)

## 2020-07-24 MED ORDER — WARFARIN SODIUM 1 MG PO TABS
1.0000 mg | ORAL_TABLET | Freq: Once | ORAL | Status: AC
  Administered 2020-07-24: 1 mg via ORAL
  Filled 2020-07-24: qty 1

## 2020-07-24 MED ORDER — SENNOSIDES-DOCUSATE SODIUM 8.6-50 MG PO TABS
2.0000 | ORAL_TABLET | Freq: Every day | ORAL | Status: DC
  Administered 2020-07-25 – 2020-07-29 (×5): 2 via ORAL
  Filled 2020-07-24 (×5): qty 2

## 2020-07-24 MED ORDER — POTASSIUM CHLORIDE CRYS ER 20 MEQ PO TBCR
40.0000 meq | EXTENDED_RELEASE_TABLET | ORAL | Status: AC
  Administered 2020-07-24 (×2): 40 meq via ORAL
  Filled 2020-07-24 (×2): qty 2

## 2020-07-24 NOTE — Progress Notes (Signed)
Inpatient Rehab Admissions Coordinator:   Please see note from rehab PA, Marissa Nestle, on today's date.  Will sign off for now and follow from a distance.  If patient demonstrates ability to tolerate EOB/OOB could reconsider CIR.    Estill Dooms, PT, DPT Admissions Coordinator [phone] 07/24/20  12:01 PM

## 2020-07-24 NOTE — Progress Notes (Signed)
Occupational Therapy Treatment Patient Details Name: Carl Hill MRN: [id number] DOB: [birth year] Today's Date: 07/24/2020    History of present illness Pt is a 50 year old male who presented with buttock pain, fatigue and malaise.  Found to have hyperglycemia. CT pelvis demonstrated necrotizing fasciitis in L buttock, L upper thigh, and perineum. Taken to OR for debridement 1/10 c/b Afib with RVR and VT arrest s/p CPR/defib. Trached 1/26, remains on full vent support. PMHx: HTN, obesity, DM, AFib.  As of 07/04/20 started TC trials.   OT comments  Pt not premedicated - discussed option with nsg regarding having pain managed by scheduled time for OT/PT by 8:30 am every weekday as that is the designated time for therapy.  Excellent session today. Tolerated up to 63 degree tilt. At 50+ degrees pt working on counter pressure exercises and pursed lip breathing to help with BP and able to tolerate standing at least 15 minutes. BP appeared stable throughout with max HR 149. Long discussion with pt regarding need to increase HOB and reduce time laying flat. Pt educated on using bed control to increase HOB to facilitate his active role in bed positioning/sitting tolerance/improving BP with positional changes. Pt able to return demonstrate. Pt to complete this 5x/day - boxes marked on dry erase board for staff to check off once completed. Nsg notified of plan. Encouraged pt to complete BUE theraband exercises throughout the day. Making steady progress. Continue to recommend rehab at [facility].   Follow Up Recommendations  CIR;Supervision/Assistance - 24 hour    Equipment Recommendations  Wheelchair (measurements OT);Wheelchair cushion (measurements OT);Hospital bed;3 in 1 bedside commode    Recommendations for Other Services Rehab consult    Precautions / Restrictions Precautions Precautions: Fall Precaution Comments: TC (cuffless now), rectal tube, large buttocks wound, hydro on hold;  PMSV Restrictions Weight Bearing Restrictions: No       Mobility Bed Mobility     Rolling: Modified independent (Device/Increase time)              Transfers                      Balance Overall balance assessment: Needs assistance   Sitting balance-Leahy Scale: Good       Standing balance-Leahy Scale: Poor Standing balance comment: supporting self with B bed rails although ablet o bring BUE to midline                           ADL either performed or assessed with clinical judgement   ADL                                         General ADL Comments: Pt states he does 10-90% of his bathing. discussed need to him to attempt to have his HOB in more of an upright position when bathing. Encouraged pt to work on rolling side- side to reach and wash his back. Pt verbalized understanding.     Vision       Perception     Praxis      Cognition Arousal/Alertness: Awake/alert Behavior During Therapy: WFL for tasks assessed/performed Overall Cognitive Status: Within Functional Limits for tasks assessed  Exercises General Exercises - Upper Extremity Shoulder Flexion: Strengthening;AROM;Both;20 reps;Standing Elbow Flexion: Strengthening;Both;20 reps;Standing Elbow Extension: Strengthening;Both;20 reps;Standing General Exercises - Lower Extremity Gluteal Sets: 20 reps;Standing Toe Raises: 20 reps Mini-Sqauts: 20 reps;Standing Other Exercises Other Exercises: pursed lip breathing in standing   Shoulder Instructions       General Comments      Pertinent Vitals/ Pain       Pain Assessment: 0-10 Faces Pain Scale: Hurts whole lot Pain Location: rectum and buttocks Pain Descriptors / Indicators: Discomfort;Grimacing;Guarding;Moaning;Burning Pain Intervention(s): Limited activity within patient's tolerance;Patient requesting pain meds-RN notified;RN gave pain meds during  session;Relaxation  Home Living                                          Prior Functioning/Environment              Frequency  Min 2X/week        Progress Toward Goals  OT Goals(current goals can now be found in the care plan section)  Progress towards OT goals: Progressing toward goals  Acute Rehab OT Goals Patient Stated Goal: to get stronger, try sitting up EOB, get back to walking with RW OT Goal Formulation: With patient Time For Goal Achievement: 08/05/20 Potential to Achieve Goals: Good ADL Goals Pt Will Perform Eating: with modified independence;with adaptive utensils Pt Will Perform Grooming: with set-up;sitting Pt Will Perform Upper Body Bathing: with set-up;bed level Pt Will Perform Lower Body Bathing: with mod assist;bed level;with adaptive equipment Pt Will Perform Upper Body Dressing: with set-up;sitting Pt/caregiver will Perform Home Exercise Program: Increased strength;Both right and left upper extremity;With Supervision;With theraband Additional ADL Goal #1: Pt will tolerate seated position x 10 min in preparation for ADL tasks Additional ADL Goal #2: Pt will tolerate x5 mins of unsupported sitting for dynamic sitting balance tasks with O2 >90%. Additional ADL Goal #3: Pt will tolerate standing @ 60 degrees x 5 min in preparation for ADL tasks and mobility  Plan Discharge plan remains appropriate    Co-evaluation                 AM-PAC OT "6 Clicks" Daily Activity     Outcome Measure   Help from another person eating meals?: A Little Help from another person taking care of personal grooming?: A Little Help from another person toileting, which includes using toliet, bedpan, or urinal?: Total Help from another person bathing (including washing, rinsing, drying)?: A Lot Help from another person to put on and taking off regular upper body clothing?: A Lot Help from another person to put on and taking off regular lower body  clothing?: Total 6 Click Score: 12    End of Session Equipment Utilized During Treatment: Oxygen (28% FiO2 4L)  OT Visit Diagnosis: Unsteadiness on feet (R26.81);Muscle weakness (generalized) (M62.81);Pain Pain - part of body:  (back/butt)   Activity Tolerance Patient tolerated treatment well   Patient Left in bed;with call bell/phone within reach   Nurse Communication Mobility status;Other (comment);Patient requests pain meds (Medication schedule; encouraging increasing HOB)        Time: 1224-8250 OT Time Calculation (min): 51 min  Charges: OT General Charges $OT Visit: 1 Visit OT Treatments $Self Care/Home Management : 8-22 mins $Therapeutic Activity: 8-22 mins $Neuromuscular Re-education: 8-22 mins  Luisa Dago, OT/L   Acute OT Clinical Specialist Acute Rehabilitation Services Pager [phone] Office [phone]    [facility] 07/24/2020,  11:45 AM

## 2020-07-24 NOTE — Progress Notes (Addendum)
PROGRESS NOTE    Carl Hill  XLK:[id number] DOB: 12-19-1970 DOA: 06/10/2020 PCP: Malka SoJobe, Daniel B., MD   Brief Narrative: 50 year old with past medical history significant for hypertension, obesity, diabetes, A. fib on DOAC, presented with buttock pain, malaise.  CT pelvis showed necrotizing fasciitis in left buttock, left upper thigh and perineum. -Taken to the OR for debridement 1/10, complicated by A. fib with RVR and VT arrest status post CPR defibrillation.  Prolonged mechanical ventilation, eventually trach on 1/26. -Currently on trach collar.  He was transferred to progressive care [facility] to 01/07/2021 -Janina Mayorach was downgraded to #6 cuffless on 12/15, per pulmonary no plans for decannulation until has increased mobility or weight loss.  -Remains stable Overall, difficult placement due to morbid obesity, new trach.  Significant Hospital events: 1/10 [facility] with sepsis, necrotizing fasciitis. General surgery consulted and debridement pursued. In the afternoon, patient developed hypotension with AF in RVR. Synchronized cardioversion unsuccessful with conversion to VT. CPR initated with ROSC after desynchronized defibrillation. 1/11 Debridement 1/18 Mucus plugging 1/19 Bronchoscopy >> mucus plug on Rt, airway collapse with exhalation 1/20 Persistent fever, change ABx 1/24 Increased vent requirements, habitus related +/- mucus plugging 1/25 Hypotensive to SBP 80s, Levo resumed, minimally responsive overnight off sedation, CT Head negative 1/26 Stable vent/pressor requirements, bronched, tracheostomy 1/29 start hydrotherapy 1/30 start lovenox/coumadin 2/2 Tolerating PS 2/4 did TC for 12 hours 2/5 Did 24 hours TC 2/6 Continue TC  2/7 Try dilaudid with turns/dressing change for better pain control - longer acting, fentanyl IV d/c'd 2/8 Increase standing oxy, dilaudid PRN for turns/dressing changes, downgrade to progressive 2/15 trach changed to # 6 cuffless   Assessment &  Plan:   Principal Problem:   Fournier gangrene Active Problems:   Sepsis (HCC)   DKA (diabetic ketoacidosis) (HCC)   AKI (acute kidney injury) (HCC)   Diabetic ulcer of heel (HCC)   Atrial fibrillation, chronic (HCC)   Essential hypertension   Dyslipidemia   Acquired hypothyroidism   Chronic pain disorder   Morbid obesity with BMI of 60.0-69.9, adult (HCC)   Acute respiratory failure (HCC)   Sepsis due to Streptococcus, group B (HCC)   Actinomycosis  1-Acute on Chronic Hypoxic and Hypercapnic Respiratory Failure in the setting of Sepsis, Morbid obesity, healthcare associated pneumonia, recurrent mucous plugging; -Prolonged respiratory failure requiring mechanical ventilation, failed attempt at extubation. -Status post tracheostomy on 1/26, wean off ventilator. -Trach was downgraded to cuffless #6 Shiley on 2/15. -SLP following.  -Continue routine trach care, per pulmonary no plans for decannulation, overall remains stable with continued to be difficult to place.  2-Left buttock necrotizing fasciitis with E. coli, group B strep, Peptostreptococcus in wound culture: -Excisional debridement of perianal, gluteal and perineal necrotizing soft tissue infection by Dr. Freida BusmanAllen 1/10 -Back to OR 1/11 and excisional debridement of perineal necrosis, by Dr. Freida BusmanAllen. -Received hydrotherapy from 06/29/2020 until 07/04/2020. -No further debridement.  General surgery recommended wound care to follow-up. -Seen by infectious disease who recommended Unasyn followed by Augmentin for 6558-month course due to actinomyces in the wound.  Patient currently on Augmentin. -Continue with wound care and dressing changes.  Diarrhea: Laxative, has rectal tube to protect the wound. Change Senokot to daily  3-hypernatremia from diuresis and insensible losses: Resolved  4-type 2 diabetes: Currently on regular diet: Continue with Lantus 42 units twice daily, sliding scale insulin  Acute blood loss anemia from blood  loss from wound:  Status post blood transfusion. Hb  stable  Hypothyroidism: Continue with Synthroid  A. Fib: Controlled.  Continue with amiodarone and diltiazem. He was previously on Xarelto but was transitioned to Coumadin in the ICU, per pharmacy as Xarelto was not felt to be safe for body weight more than 500 pounds. Continue with Coumadin  Pressure injury present on admission See documentation from wound care nurse  Edema: Continue with Lipitor Intermittent blurry vision: Could be related to hypotension versus narcotics,  Also happens at home.   Pressure Injury 06/10/20 Heel Right Stage 2 -  Partial thickness loss of dermis presenting as a shallow open injury with a red, pink wound bed without slough. (Active)  06/10/20 1400  Location: Heel  Location Orientation: Right  Staging: Stage 2 -  Partial thickness loss of dermis presenting as a shallow open injury with a red, pink wound bed without slough.  Wound Description (Comments):   Present on Admission: Yes     Nutrition Problem: Increased nutrient needs Etiology: wound healing    Signs/Symptoms: estimated needs    Interventions: Ensure Enlive (each supplement provides 350kcal and 20 grams of protein),MVI,Premier Protein,Magic cup  Estimated body mass index is 77.82 kg/m as calculated from the following:   Height as of this encounter: 5\' 10"  (1.778 m).   Weight as of this encounter: 246 kg.   DVT prophylaxis: Warfarin Code Status: Full code Family Communication: Care discussed with patient Disposition Plan:  Status is: Inpatient  Remains inpatient appropriate because:Ongoing active pain requiring inpatient pain management   Dispo: The patient is from: Home              Anticipated d/c is to: SNF              Anticipated d/c date is: 2 days              Patient currently is not medically stable to d/c.   Difficult to place patient No        Consultants:   Surgery  ID  CCM  Procedures:  ETT  1/10 >> R IJ CVL 1/10 >> L brachial A-line 1/10 >> RECTAL TUBE.  S/P TRACH  ON 1/26 6 CUFFLESS ON 2/15   Antimicrobials: Aztreonam 1/09 Metronidazole 1/09 Vancomycin 1/09, 1/11, 1/20 >>1/22 Cefepime 1/10 >> 1/13, 1/20 >>1/22 Clindamycin 1/10 >> 1/13 Ceftriaxone 1/13 >> 1/20 Metronidazole 1/17 >> 1/22 Meropenem 1/22 >> 1/25 Amoxicillin 1/24 (x 1 for PCN Allergy challenge) Unasyn 1/25 >> 1/27 Augmentin 1/27 >>   Subjective: He has been working with PT, gets pain buttocks when he is doing exercise with PT.  Denies dyspnea.   Objective: Vitals:   07/24/20 0759 07/24/20 1137 07/24/20 1145 07/24/20 1228  BP: 123/64   (!) 121/52  Pulse: (!) 107 (!) 116 (!) 101 (!) 110  Resp: 19 (!) 22 18 19   Temp: 97.6 F (36.4 C)   (!) 97.4 F (36.3 C)  TempSrc: Oral   Oral  SpO2: 96% 94% 93% 98%  Weight:      Height:        Intake/Output Summary (Last 24 hours) at 07/24/2020 1409 Last data filed at 07/24/2020 1351 Gross per 24 hour  Intake 2251 ml  Output 7600 ml  Net -5349 ml   Filed Weights   07/22/20 0143 07/23/20 0537 07/24/20 0237  Weight: (!) 240 kg (!) 245.2 kg (!) 246 kg    Examination:  General exam: Appears calm and comfortable  Respiratory system: Clear to auscultation. Respiratory effort normal. Cardiovascular system: S1 & S2 heard, RRR. No JVD, murmurs, rubs,  gallops or clicks. No pedal edema. Gastrointestinal system: Abdomen is nondistended, soft and nontender. No organomegaly or masses felt. Normal bowel sounds heard.  Central nervous system: Alert and oriented. Extremities: no edema  Data Reviewed: I have personally reviewed following labs and imaging studies  CBC: Recent Labs  Lab 07/18/20 0259 07/19/20 0314 07/21/20 0257 07/23/20 0253 07/24/20 0808  WBC 7.6 6.8 7.3 7.0 6.9  HGB 8.5* 8.6* 9.0* 9.0* 8.5*  HCT 27.0* 27.1* 29.1* 28.0* 27.7*  MCV 87.4 86.6 88.2 86.4 88.2  PLT 211 212 243 244 258   Basic Metabolic Panel: Recent Labs  Lab  07/18/20 0259 07/19/20 0314 07/21/20 0257 07/23/20 0253 07/24/20 0808  NA 135 133* 138 135 136  K 3.6 3.2* 4.1 3.3* 3.4*  CL 92* 92* 92* 92* 94*  CO2 32 31 34* 31 30  GLUCOSE 150* 144* 131* 159* 138*  BUN 11 8 8 9 8   CREATININE 1.01 1.01 0.93 0.96 0.98  CALCIUM 8.5* 8.4* 8.8* 8.4* 8.3*   GFR: Estimated Creatinine Clearance: 185.4 mL/min (by C-G formula based on SCr of 0.98 mg/dL). Liver Function Tests: No results for input(s): AST, ALT, ALKPHOS, BILITOT, PROT, ALBUMIN in the last 168 hours. No results for input(s): LIPASE, AMYLASE in the last 168 hours. No results for input(s): AMMONIA in the last 168 hours. Coagulation Profile: Recent Labs  Lab 07/18/20 0259 07/19/20 0314 07/22/20 0542 07/23/20 0253 07/24/20 0402  INR 2.8* 2.9* 3.9* 3.6* 3.1*   Cardiac Enzymes: No results for input(s): CKTOTAL, CKMB, CKMBINDEX, TROPONINI in the last 168 hours. BNP (last 3 results) No results for input(s): PROBNP in the last 8760 hours. HbA1C: No results for input(s): HGBA1C in the last 72 hours. CBG: Recent Labs  Lab 07/23/20 1155 07/23/20 1616 07/23/20 2107 07/24/20 0620 07/24/20 1230  GLUCAP 127* 160* 136* 126* 134*   Lipid Profile: No results for input(s): CHOL, HDL, LDLCALC, TRIG, CHOLHDL, LDLDIRECT in the last 72 hours. Thyroid Function Tests: No results for input(s): TSH, T4TOTAL, FREET4, T3FREE, THYROIDAB in the last 72 hours. Anemia Panel: No results for input(s): VITAMINB12, FOLATE, FERRITIN, TIBC, IRON, RETICCTPCT in the last 72 hours. Sepsis Labs: No results for input(s): PROCALCITON, LATICACIDVEN in the last 168 hours.  No results found for this or any previous visit (from the past 240 hour(s)).       Radiology Studies: No results found.      Scheduled Meds: . amiodarone  200 mg Oral Daily  . amoxicillin-clavulanate  1 tablet Oral Q8H  . atorvastatin  20 mg Oral QPM  . chlorhexidine  15 mL Mouth Rinse BID  . Chlorhexidine Gluconate Cloth  6 each  Topical Daily  . collagenase  1 application Topical Daily  . diltiazem  240 mg Oral Daily  . feeding supplement  237 mL Oral BID BM  . insulin aspart  0-20 Units Subcutaneous TID WC  . insulin aspart  0-5 Units Subcutaneous QHS  . insulin aspart  18 Units Subcutaneous TID WC  . insulin detemir  42 Units Subcutaneous BID  . levothyroxine  75 mcg Oral Daily  . mouth rinse  15 mL Mouth Rinse q12n4p  . multivitamin with minerals  1 tablet Oral Daily  . nortriptyline  25 mg Oral BID  . pantoprazole  40 mg Oral Daily  . pregabalin  300 mg Oral BID  . Ensure Max Protein  11 oz Oral QHS  . senna-docusate  2 tablet Oral BID  . sodium chloride flush  3 mL Intravenous  Q12H  . warfarin  1 mg Oral ONCE-1600  . Warfarin - Pharmacist Dosing Inpatient   Does not apply q1600   Continuous Infusions: . sodium chloride Stopped (06/28/20 2035)     LOS: 44 days    Time spent: 35 minutes.     Alba Cory, MD [facility]   If 7PM-7AM, please contact night-coverage [email or web address]  07/24/2020, 2:09 PM

## 2020-07-24 NOTE — Progress Notes (Signed)
Thank you for consult on Carl Hill. Chart reviewed and note that patient continues to have problems with tolerance of activity--he is unable to tolerate tilt of 50 degrees for longer than 3 minutes and currently able to tolerate 15 minutes at 40 degrees. He needs to show tolerance for at least an hour of therapy consistently and do not feel that patient will be able to tolerate 3 hrs/day of therapy. Recommend pursing LTAC/SNF for follow up therapy.

## 2020-07-24 NOTE — Progress Notes (Signed)
ANTICOAGULATION CONSULT NOTE  Pharmacy Consult for Warfarin  Indication: atrial fibrillation  Patient Measurements: Height: 5\' 10"  (177.8 cm) Weight: (!) 246 kg (542 lb 5.3 oz) IBW/kg (Calculated) : 73 Heparin Dosing Weight: 149.7 kg  Vital Signs: Temp: 97.6 F (36.4 C) (02/23 0759) Temp Source: Oral (02/23 0759) BP: 123/64 (02/23 0759) Pulse Rate: 107 (02/23 0759)  Labs: Recent Labs    07/22/20 0542 07/23/20 0253 07/24/20 0402 07/24/20 0808  HGB  --  9.0*  --  8.5*  HCT  --  28.0*  --  27.7*  PLT  --  244  --  258  LABPROT 37.1* 34.4* 30.7*  --   INR 3.9* 3.6* 3.1*  --   CREATININE  --  0.96  --   --     Estimated Creatinine Clearance: 189.3 mL/min (by C-G formula based on SCr of 0.96 mg/dL).   Assessment: 49YOM admitted with septic shock secondary to necrotizing fascitis. Was on Xarelto PTA, but that is not appropriate based on patient's weight. Pharmacy has been consulted to dose warfarin.  INR is therapeutic (trending upwards) to 2.9. Warfarin held 2/4-2/7 & 2/10-2/13 for high INR. Noted drug-drug interactions with amiodarone and Augmentin. Eating 100% of meals. Hgb, plt stable. No bleeding noted. Per doses given so far, suspect will maintain on 5mg  daily.   INR trended down to 3.1 today. Pt is on amio and Augmentin but not new. We will give a small dose today to prevent a big drop.   Goal of Therapy:  INR 2-3 Monitor platelets by anticoagulation protocol: Yes  Plan:  Coumadin 1mg  PO x1 Daily INR and weekly CBC Monitor for s/sx of bleeding  07-20-1970, PharmD, BCIDP, AAHIVP, CPP Infectious Disease Pharmacist 07/24/2020 10:10 AM

## 2020-07-25 DIAGNOSIS — N493 Fournier gangrene: Secondary | ICD-10-CM | POA: Diagnosis not present

## 2020-07-25 LAB — CBC
HCT: 29.5 % — ABNORMAL LOW (ref 39.0–52.0)
Hemoglobin: 9 g/dL — ABNORMAL LOW (ref 13.0–17.0)
MCH: 26.9 pg (ref 26.0–34.0)
MCHC: 30.5 g/dL (ref 30.0–36.0)
MCV: 88.1 fL (ref 80.0–100.0)
Platelets: 290 10*3/uL (ref 150–400)
RBC: 3.35 MIL/uL — ABNORMAL LOW (ref 4.22–5.81)
RDW: 16.8 % — ABNORMAL HIGH (ref 11.5–15.5)
WBC: 7.2 10*3/uL (ref 4.0–10.5)
nRBC: 0 % (ref 0.0–0.2)

## 2020-07-25 LAB — PROTIME-INR
INR: 2.6 — ABNORMAL HIGH (ref 0.8–1.2)
Prothrombin Time: 27 seconds — ABNORMAL HIGH (ref 11.4–15.2)

## 2020-07-25 LAB — GLUCOSE, CAPILLARY
Glucose-Capillary: 135 mg/dL — ABNORMAL HIGH (ref 70–99)
Glucose-Capillary: 175 mg/dL — ABNORMAL HIGH (ref 70–99)
Glucose-Capillary: 149 mg/dL — ABNORMAL HIGH (ref 70–99)
Glucose-Capillary: 122 mg/dL — ABNORMAL HIGH (ref 70–99)

## 2020-07-25 LAB — BASIC METABOLIC PANEL
Anion gap: 11 (ref 5–15)
BUN: 8 mg/dL (ref 6–20)
CO2: 30 mmol/L (ref 22–32)
Calcium: 8.4 mg/dL — ABNORMAL LOW (ref 8.9–10.3)
Chloride: 94 mmol/L — ABNORMAL LOW (ref 98–111)
Creatinine, Ser: 1.02 mg/dL (ref 0.61–1.24)
GFR, Estimated: >60 mL/min (ref >60)
Glucose, Bld: 125 mg/dL — ABNORMAL HIGH (ref 70–99)
Potassium: 3.7 mmol/L (ref 3.5–5.1)
Sodium: 135 mmol/L (ref 135–145)

## 2020-07-25 MED ORDER — WARFARIN SODIUM 2 MG PO TABS
4.0000 mg | ORAL_TABLET | Freq: Once | ORAL | Status: AC
  Administered 2020-07-25: 4 mg via ORAL
  Filled 2020-07-25: qty 2

## 2020-07-25 MED ORDER — SODIUM CHLORIDE 0.9 % IV BOLUS
500.0000 mL | Freq: Once | INTRAVENOUS | Status: AC
  Administered 2020-07-25: 500 mL via INTRAVENOUS

## 2020-07-25 NOTE — Progress Notes (Signed)
ANTICOAGULATION CONSULT NOTE  Pharmacy Consult for Warfarin  Indication: atrial fibrillation  Patient Measurements: Height: 5\' 10"  (177.8 cm) Weight: (!) 246.8 kg (544 lb 1.5 oz) IBW/kg (Calculated) : 73 Heparin Dosing Weight: 149.7 kg  Vital Signs: Temp: 98 F (36.7 C) (02/24 0830) Temp Source: Oral (02/24 0830) BP: 130/75 (02/24 0830) Pulse Rate: 108 (02/24 0830)  Labs: Recent Labs    07/23/20 0253 07/24/20 0402 07/24/20 0808 07/25/20 0439  HGB 9.0*  --  8.5* 9.0*  HCT 28.0*  --  27.7* 29.5*  PLT 244  --  258 290  LABPROT 34.4* 30.7*  --  27.0*  INR 3.6* 3.1*  --  2.6*  CREATININE 0.96  --  0.98  --     Estimated Creatinine Clearance: 185.8 mL/min (by C-G formula based on SCr of 0.98 mg/dL).   Assessment: 49YOM admitted with septic shock secondary to necrotizing fascitis. Was on Xarelto PTA, but that is not appropriate based on patient's weight. Pharmacy has been consulted to dose warfarin.  INR is therapeutic (trending upwards) to 2.9. Warfarin held 2/4-2/7 & 2/10-2/13 for high INR. Noted drug-drug interactions with amiodarone and Augmentin. Eating 100% of meals. Hgb, plt stable. No bleeding noted. Per doses given so far, suspect will maintain on 5mg  daily.   INR trended down to 2.6 today. Pt is on amio and Augmentin but not new. Doses were held for 3d due to supratherapeutic INR.   Goal of Therapy:  INR 2-3 Monitor platelets by anticoagulation protocol: Yes  Plan:  Coumadin 4mg  PO x1 Daily INR and weekly CBC Monitor for s/sx of bleeding  07-20-1970, PharmD, BCIDP, AAHIVP, CPP Infectious Disease Pharmacist 07/25/2020 9:35 AM

## 2020-07-25 NOTE — Progress Notes (Signed)
Physical Therapy Treatment Patient Details Name: Carl Hill MRN: [id number] DOB: 20-Jan-1971 Today's Date: 07/25/2020    History of Present Illness Pt is a 50 year old male who presented with buttock pain, fatigue and malaise.  Found to have hyperglycemia. CT pelvis demonstrated necrotizing fasciitis in L buttock, L upper thigh, and perineum. Taken to OR for debridement 1/10 c/b Afib with RVR and VT arrest s/p CPR/defib. Trached 1/26 PMHx: HTN, obesity, DM, AFib.  As of 07/04/20 started TC trials.    PT Comments    Pt with much better upright tolerance today. Able to maintain tilt 30-72 degrees for a total of 35 minutes, 8 of which were at 72 degrees) Pt limited by LE fatigue and back pain not orthostatic hypotension. BP 91/65 (assymmptomatic) and HR max 141 with exercise. While in upright pt able to perform LE and core strengthening. After tilt pt able to tolerate seated position in bed at 50 degree HoB elevation at 50 degrees for 10 minutes. Pt agreeable to 4 more bouts of sitting with HoB 50 degrees for 10 min each. At end of session pt in left sidelying in reverse Trendelenburg with a goal of 30 minutes to continue to improve BP, pt with independent control to return to flattened bed and able to demonstrate.  Therapy will continue to work towards goal of 60 min OOB to be eligible for CIR placement.    Follow Up Recommendations  CIR     Equipment Recommendations  Wheelchair (measurements PT);Wheelchair cushion (measurements PT);Hospital bed;Other (comment);3in1 (PT) (hoyer lift, bariatric equipment)    Recommendations for Other Services Rehab consult     Precautions / Restrictions Precautions Precautions: Fall Precaution Comments: TC (cuffless now), rectal tube, large buttocks wound, hydro on hold; PMSV Restrictions Weight Bearing Restrictions: No    Mobility  Bed Mobility Overal bed mobility: Needs Assistance Bed Mobility: Rolling Rolling: Supervision         General  bed mobility comments: extra time needed, heavy use of rail. Able to complete rolling, moving up in the bed, and centering in bed all with only supervision level Start Time: 0850 Angle: 72 degrees Total Minutes in Angle: 8 minutes (total tilt time 35 min) Patient Response: Cooperative            Cognition Arousal/Alertness: Awake/alert Behavior During Therapy: WFL for tasks assessed/performed Overall Cognitive Status: Within Functional Limits for tasks assessed                                        Exercises General Exercises - Lower Extremity Ankle Circles/Pumps: AROM;Both;20 reps;Sidelying Quad Sets: 20 reps;Standing Mini-Sqauts: 10 reps (40 degrees) Other Exercises Other Exercises: crunches while strapped in bed at 72 degrees, able to bring shoulders off bed x 10 (unable to attain full trunk flexion due to back pain) Other Exercises: 10 minutes in seated position with HoB at 50 degrees    General Comments General comments (skin integrity, edema, etc.): BP dropped to 91/65 with 72 degrees tilt but pt remained assymptomatic, tilt was limited by LE fatigue and back pain      Pertinent Vitals/Pain Pain Assessment: Faces Faces Pain Scale: Hurts even more Pain Location: buttocks and back spasms with exercise Pain Descriptors / Indicators: Guarding;Burning Pain Intervention(s): Limited activity within patient's tolerance;Monitored during session;Repositioned;Premedicated before session;Relaxation           PT Goals (current goals can now be  found in the care plan section) Acute Rehab PT Goals Patient Stated Goal: to get stronger, try sitting up EOB, get back to walking with RW PT Goal Formulation: With patient Time For Goal Achievement: 07/29/20 Potential to Achieve Goals: Fair Progress towards PT goals: Progressing toward goals    Frequency    Min 3X/week      PT Plan Current plan remains appropriate       AM-PAC PT "6 Clicks" Mobility    Outcome Measure  Help needed turning from your back to your side while in a flat bed without using bedrails?: A Little Help needed moving from lying on your back to sitting on the side of a flat bed without using bedrails?: Total Help needed moving to and from a bed to a chair (including a wheelchair)?: Total Help needed standing up from a chair using your arms (e.g., wheelchair or bedside chair)?: Total Help needed to walk in hospital room?: Total Help needed climbing 3-5 steps with a railing? : Total 6 Click Score: 8    End of Session Equipment Utilized During Treatment: Oxygen (28% TC, 3.5 L) Activity Tolerance: Patient limited by pain;Patient tolerated treatment well;Patient limited by fatigue Patient left: in bed;with call bell/phone within reach;with family/visitor present;Other (comment) (placed in reverse trendelenberg (15 degree) elevation to increase tolerance to upright) Nurse Communication: Mobility status PT Visit Diagnosis: Muscle weakness (generalized) (M62.81);Other abnormalities of gait and mobility (R26.89)     Time: 9381-8299 PT Time Calculation (min) (ACUTE ONLY): 61 min  Charges:  $Therapeutic Exercise: 38-52 mins $Therapeutic Activity: 8-22 mins                     Carl Hill PT, DPT Acute Rehabilitation Services Pager [phone] Office [phone]    Carl Hill Fleet 07/25/2020, 1:20 PM

## 2020-07-25 NOTE — Progress Notes (Signed)
PROGRESS NOTE    Carl Hill  DGU:[id number] DOB: 1970-07-27 DOA: 06/10/2020 PCP: Malka So., MD   Brief Narrative: 50 year old with past medical history significant for hypertension, obesity, diabetes, A. fib on DOAC, presented with buttock pain, malaise.  CT pelvis showed necrotizing fasciitis in left buttock, left upper thigh and perineum. -Taken to the OR for debridement 1/10, complicated by A. fib with RVR and VT arrest status post CPR defibrillation.  Prolonged mechanical ventilation, eventually trach on 1/26. -Currently on trach collar.  He was transferred to progressive care [facility] to 01/07/2021 -Janina Mayo was downgraded to #6 cuffless on 12/15, per pulmonary no plans for decannulation until has increased mobility or weight loss.  -Remains stable Overall, difficult placement due to morbid obesity, new trach.  Significant Hospital events: 1/10 Admitted to [facility] with sepsis, necrotizing fasciitis. General surgery consulted and debridement pursued. In the afternoon, patient developed hypotension with AF in RVR. Synchronized cardioversion unsuccessful with conversion to VT. CPR initated with ROSC after desynchronized defibrillation. 1/11 Debridement 1/18 Mucus plugging 1/19 Bronchoscopy >> mucus plug on Rt, airway collapse with exhalation 1/20 Persistent fever, change ABx 1/24 Increased vent requirements, habitus related +/- mucus plugging 1/25 Hypotensive to SBP 80s, Levo resumed, minimally responsive overnight off sedation, CT Head negative 1/26 Stable vent/pressor requirements, bronched, tracheostomy 1/29 start hydrotherapy 1/30 start lovenox/coumadin 2/2 Tolerating PS 2/4 did TC for 12 hours 2/5 Did 24 hours TC 2/6 Continue TC  2/7 Try dilaudid with turns/dressing change for better pain control - longer acting, fentanyl IV d/c'd 2/8 Increase standing oxy, dilaudid PRN for turns/dressing changes, downgrade to progressive 2/15 trach changed to # 6 cuffless   Assessment &  Plan:   Principal Problem:   Fournier gangrene Active Problems:   Sepsis (HCC)   DKA (diabetic ketoacidosis) (HCC)   AKI (acute kidney injury) (HCC)   Diabetic ulcer of heel (HCC)   Atrial fibrillation, chronic (HCC)   Essential hypertension   Dyslipidemia   Acquired hypothyroidism   Chronic pain disorder   Morbid obesity with BMI of 60.0-69.9, adult (HCC)   Acute respiratory failure (HCC)   Sepsis due to Streptococcus, group B (HCC)   Actinomycosis  1-Acute on Chronic Hypoxic and Hypercapnic Respiratory Failure in the setting of Sepsis, Morbid obesity, healthcare associated pneumonia, recurrent mucous plugging; -Prolonged respiratory failure requiring mechanical ventilation, failed attempt at extubation. -Status post tracheostomy on 1/26, wean off ventilator. -Trach was downgraded to cuffless #6 Shiley on 2/15. -SLP following.  -Continue routine trach care, per pulmonary no plans for decannulation, overall remains stable with continued to be difficult to place.  2-Left buttock necrotizing fasciitis with E. coli, group B strep, Peptostreptococcus in wound culture: -Excisional debridement of perianal, gluteal and perineal necrotizing soft tissue infection by Dr. Freida Busman 1/10 -Back to OR 1/11 and excisional debridement of perineal necrosis, by Dr. Freida Busman. -Received hydrotherapy from 06/29/2020 until 07/04/2020. -No further debridement.  General surgery recommended wound care to follow-up. -Seen by infectious disease who recommended Unasyn followed by Augmentin for 2-month course due to actinomyces in the wound.  Patient currently on Augmentin. -Continue with wound care and dressing changes.  3-Hypernatremia from diuresis and insensible losses: Resolved  4-Type 2 Diabetes: Currently on regular diet: Continue with Lantus 42 units twice daily, sliding scale insulin  Acute blood loss anemia from blood loss from wound:  Status post blood transfusion. Hb   stable  Hypothyroidism: -Continue with Synthroid  A. Fib: -Controlled.  Continue with amiodarone and diltiazem. -He was previously on  Xarelto but was transitioned to Coumadin in the ICU, per pharmacy as Xarelto was not felt to be safe for body weight more than 500 pounds. -Continue with Coumadin -tachycardia, will give IV bolus.   Pressure injury present on admission -See documentation from wound care nurse -He has rectal tube, for weeks. Discussed with patient risk of having rectal tube, ulceration, lost of sphincter tone. I have place order for nurses to deflect ballon daily for 1-2 hours.  -Continue with  dressing changes.   Edema: Continue with Lipitor Intermittent blurry vision: Could be related to hypotension versus narcotics,  Also happens at home.   Pressure Injury 06/10/20 Heel Right Stage 2 -  Partial thickness loss of dermis presenting as a shallow open injury with a red, pink wound bed without slough. (Active)  06/10/20 1400  Location: Heel  Location Orientation: Right  Staging: Stage 2 -  Partial thickness loss of dermis presenting as a shallow open injury with a red, pink wound bed without slough.  Wound Description (Comments):   Present on Admission: Yes     Nutrition Problem: Increased nutrient needs Etiology: wound healing    Signs/Symptoms: estimated needs    Interventions: Ensure Enlive (each supplement provides 350kcal and 20 grams of protein),MVI,Premier Protein,Magic cup  Estimated body mass index is 78.07 kg/m as calculated from the following:   Height as of this encounter: 5\' 10"  (1.778 m).   Weight as of this encounter: 246.8 kg.   DVT prophylaxis: Warfarin Code Status: Full code Family Communication: Care discussed with patient Disposition Plan:  Status is: Inpatient  Remains inpatient appropriate because:Ongoing active pain requiring inpatient pain management   Dispo: The patient is from: Home              Anticipated d/c is to: SNF               Anticipated d/c date is: 2 days              Patient currently is not medically stable to d/c.   Difficult to place patient No        Consultants:   Surgery  ID  CCM  Procedures:  ETT 1/10 >> R IJ CVL 1/10 >> L brachial A-line 1/10 >> RECTAL TUBE.  S/P TRACH  ON 1/26 6 CUFFLESS ON 2/15   Antimicrobials: Aztreonam 1/09 Metronidazole 1/09 Vancomycin 1/09, 1/11, 1/20 >>1/22 Cefepime 1/10 >> 1/13, 1/20 >>1/22 Clindamycin 1/10 >> 1/13 Ceftriaxone 1/13 >> 1/20 Metronidazole 1/17 >> 1/22 Meropenem 1/22 >> 1/25 Amoxicillin 1/24 (x 1 for PCN Allergy challenge) Unasyn 1/25 >> 1/27 Augmentin 1/27 >>   Subjective: He report having rectal cramps pain yesterday.  He denies dyspnea. He has been working with PT>   Objective: Vitals:   07/25/20 0426 07/25/20 0829 07/25/20 0830 07/25/20 1158  BP:  130/75 130/75   Pulse: (!) 104  (!) 108 (!) 108  Resp: 17  19 18   Temp:   98 F (36.7 C)   TempSrc:   Oral   SpO2: 92%  98% 98%  Weight: (!) 246.8 kg     Height:        Intake/Output Summary (Last 24 hours) at 07/25/2020 1332 Last data filed at 07/25/2020 1300 Gross per 24 hour  Intake 2403 ml  Output 7350 ml  Net -4947 ml   Filed Weights   07/23/20 0537 07/24/20 0237 07/25/20 0426  Weight: (!) 245.2 kg (!) 246 kg (!) 246.8 kg    Examination:  General  exam: Obese, NAD Respiratory system: CTA Cardiovascular system: S 1, S 2 RRR Gastrointestinal system: BS present, soft, nt Central nervous system: alert, NAD Extremities: No edema Skin large open wound decubitus.   Data Reviewed: I have personally reviewed following labs and imaging studies  CBC: Recent Labs  Lab 07/19/20 0314 07/21/20 0257 07/23/20 0253 07/24/20 0808 07/25/20 0439  WBC 6.8 7.3 7.0 6.9 7.2  HGB 8.6* 9.0* 9.0* 8.5* 9.0*  HCT 27.1* 29.1* 28.0* 27.7* 29.5*  MCV 86.6 88.2 86.4 88.2 88.1  PLT 212 243 244 258 290   Basic Metabolic Panel: Recent Labs  Lab 07/19/20 0314  07/21/20 0257 07/23/20 0253 07/24/20 0808 07/25/20 0815  NA 133* 138 135 136 135  K 3.2* 4.1 3.3* 3.4* 3.7  CL 92* 92* 92* 94* 94*  CO2 31 34* 31 30 30   GLUCOSE 144* 131* 159* 138* 125*  BUN 8 8 9 8 8   CREATININE 1.01 0.93 0.96 0.98 1.02  CALCIUM 8.4* 8.8* 8.4* 8.3* 8.4*   GFR: Estimated Creatinine Clearance: 178.5 mL/min (by C-G formula based on SCr of 1.02 mg/dL). Liver Function Tests: No results for input(s): AST, ALT, ALKPHOS, BILITOT, PROT, ALBUMIN in the last 168 hours. No results for input(s): LIPASE, AMYLASE in the last 168 hours. No results for input(s): AMMONIA in the last 168 hours. Coagulation Profile: Recent Labs  Lab 07/19/20 0314 07/22/20 0542 07/23/20 0253 07/24/20 0402 07/25/20 0439  INR 2.9* 3.9* 3.6* 3.1* 2.6*   Cardiac Enzymes: No results for input(s): CKTOTAL, CKMB, CKMBINDEX, TROPONINI in the last 168 hours. BNP (last 3 results) No results for input(s): PROBNP in the last 8760 hours. HbA1C: No results for input(s): HGBA1C in the last 72 hours. CBG: Recent Labs  Lab 07/24/20 1230 07/24/20 1546 07/24/20 2109 07/25/20 0814 07/25/20 1234  GLUCAP 134* 143* 116* 135* 175*   Lipid Profile: No results for input(s): CHOL, HDL, LDLCALC, TRIG, CHOLHDL, LDLDIRECT in the last 72 hours. Thyroid Function Tests: No results for input(s): TSH, T4TOTAL, FREET4, T3FREE, THYROIDAB in the last 72 hours. Anemia Panel: No results for input(s): VITAMINB12, FOLATE, FERRITIN, TIBC, IRON, RETICCTPCT in the last 72 hours. Sepsis Labs: No results for input(s): PROCALCITON, LATICACIDVEN in the last 168 hours.  No results found for this or any previous visit (from the past 240 hour(s)).       Radiology Studies: No results found.      Scheduled Meds: . amiodarone  200 mg Oral Daily  . amoxicillin-clavulanate  1 tablet Oral Q8H  . atorvastatin  20 mg Oral QPM  . chlorhexidine  15 mL Mouth Rinse BID  . Chlorhexidine Gluconate Cloth  6 each Topical Daily  .  collagenase  1 application Topical Daily  . diltiazem  240 mg Oral Daily  . feeding supplement  237 mL Oral BID BM  . insulin aspart  0-20 Units Subcutaneous TID WC  . insulin aspart  0-5 Units Subcutaneous QHS  . insulin aspart  18 Units Subcutaneous TID WC  . insulin detemir  42 Units Subcutaneous BID  . levothyroxine  75 mcg Oral Daily  . mouth rinse  15 mL Mouth Rinse q12n4p  . multivitamin with minerals  1 tablet Oral Daily  . nortriptyline  25 mg Oral BID  . pantoprazole  40 mg Oral Daily  . pregabalin  300 mg Oral BID  . Ensure Max Protein  11 oz Oral QHS  . senna-docusate  2 tablet Oral Daily  . sodium chloride flush  3 mL Intravenous  Q12H  . warfarin  4 mg Oral ONCE-1600  . Warfarin - Pharmacist Dosing Inpatient   Does not apply q1600   Continuous Infusions: . sodium chloride Stopped (06/28/20 2035)     LOS: 45 days    Time spent: 35 minutes.     Carl CoryBelkys A Treyson Axel, MD [facility]   If 7PM-7AM, please contact night-coverage [email or web address]  07/25/2020, 1:32 PM

## 2020-07-25 NOTE — Progress Notes (Signed)
Nutrition Follow-up  DOCUMENTATION CODES:   Morbid obesity  INTERVENTION:   -Continue double protein portions with all meals -Continue Magic cup TID with meals, each supplement provides 290 kcal and 9 grams of protein -Continue Ensure Enlive po BID, each supplement provides 350 kcal and 20 grams of protein -Continue Ensure Max podaily, each supplement provides 150 kcal and 30 grams of protein. -Continue MVI with minerals daily  NUTRITION DIAGNOSIS:   Increased nutrient needs related to wound healing as evidenced by estimated needs.  Ongoing  GOAL:   Patient will meet greater than or equal to 90% of their needs  Progressing   MONITOR:   PO intake,Supplement acceptance,Labs,Weight trends,Skin,I & O's  REASON FOR ASSESSMENT:   Consult Enteral/tube feeding initiation and management  ASSESSMENT:   50 yo male admitted with hyperglycemia, severe septic shock r/t Fournier's gangrene, AKI. PMH includes morbid obesity, HTN, DM, chronic LE wounds (L heel, L calf) A fib, laparoscopic gastric sleeve resection.  1/26- s/p tracheostomy 2/4- transitioned to trach collar 2/8- s/p FEES- advanced to full liquid diet 2/9- advanced to dysphagia 3 diet, cortrak removed 2/15- downsized to #6 cuffless trach  Reviewed I/O's: -4.6 L x 24 hours and -71.1 L since 07/11/20  UOP: 6.3 L x 24 hours  Rectal tube output: 700 ml x 24 hours  Pt unavailable at time of visit.   Pt remains with very good appetite. Noted meal completions 100%. Pt has been consuming Ensure Enlive and Ensure Max supplements.   Medications reviewed and include senokot.   Per TOC notes, leadership helping determine most appropriate discharge disposition.   Labs reviewed: K: 3.4, CBGS: 116-143 (inpatient orders for glycemic control are 0-20 units insulin aspart TID with meals, 0-5 units insulin aspart daily at bedtime, 18 units insulin aspart TID with meals, and 42 units insulin determir BID).   Diet Order:   Diet  Order            Diet Carb Modified Fluid consistency: Thin; Room service appropriate? Yes  Diet effective now                 EDUCATION NEEDS:   Education needs have been addressed  Skin:  Skin Assessment: Skin Integrity Issues: Skin Integrity Issues:: Stage II,Other (Comment) Stage II: R heel Other: necrotizing infection to pelvis, anus, rectum  Last BM:  07/24/20 (700 ml output via rectal tube)  Height:   Ht Readings from Last 1 Encounters:  06/14/20 5\' 10"  (1.778 m)    Weight:   Wt Readings from Last 1 Encounters:  07/25/20 (!) 246.8 kg    Ideal Body Weight:  75.5 kg  BMI:  Body mass index is 78.07 kg/m.  Estimated Nutritional Needs:   Kcal:  07/27/20  Protein:  150-190 grams  Fluid:  > 2 L    [id number], RD, LDN, CDCES Registered Dietitian II Certified Diabetes Care and Education Specialist Please refer to [facility] for RD and/or RD on-call/weekend/after hours pager

## 2020-07-26 ENCOUNTER — Ambulatory Visit: Payer: Medicaid Other | Admitting: Infectious Disease

## 2020-07-26 DIAGNOSIS — N493 Fournier gangrene: Secondary | ICD-10-CM | POA: Diagnosis not present

## 2020-07-26 LAB — GLUCOSE, CAPILLARY
Glucose-Capillary: 121 mg/dL — ABNORMAL HIGH (ref 70–99)
Glucose-Capillary: 136 mg/dL — ABNORMAL HIGH (ref 70–99)
Glucose-Capillary: 164 mg/dL — ABNORMAL HIGH (ref 70–99)
Glucose-Capillary: 154 mg/dL — ABNORMAL HIGH (ref 70–99)

## 2020-07-26 LAB — URIC ACID: Uric Acid, Serum: 4.6 mg/dL (ref 3.7–8.6)

## 2020-07-26 LAB — SEDIMENTATION RATE: Sed Rate: 138 mm/hr — ABNORMAL HIGH (ref 0–16)

## 2020-07-26 LAB — PROTIME-INR
INR: 2.5 — ABNORMAL HIGH (ref 0.8–1.2)
Prothrombin Time: 26.2 seconds — ABNORMAL HIGH (ref 11.4–15.2)

## 2020-07-26 LAB — C-REACTIVE PROTEIN: CRP: 4.6 mg/dL — ABNORMAL HIGH (ref <1.0)

## 2020-07-26 MED ORDER — OXYCODONE HCL ER 15 MG PO T12A
15.0000 mg | EXTENDED_RELEASE_TABLET | Freq: Two times a day (BID) | ORAL | Status: DC
Start: 2020-07-26 — End: 2020-07-30
  Administered 2020-07-26 – 2020-07-29 (×8): 15 mg via ORAL
  Filled 2020-07-26 (×8): qty 1

## 2020-07-26 MED ORDER — COVID-19 MRNA VACC (MODERNA) 50 MCG/0.25ML IM SUSP
0.2500 mL | Freq: Once | INTRAMUSCULAR | Status: AC
  Administered 2020-07-26: 0.25 mL via INTRAMUSCULAR
  Filled 2020-07-26: qty 0.25

## 2020-07-26 MED ORDER — WARFARIN SODIUM 3 MG PO TABS
3.0000 mg | ORAL_TABLET | Freq: Once | ORAL | Status: AC
  Administered 2020-07-26: 3 mg via ORAL
  Filled 2020-07-26: qty 1

## 2020-07-26 MED ORDER — NORTRIPTYLINE HCL 25 MG PO CAPS
50.0000 mg | ORAL_CAPSULE | Freq: Two times a day (BID) | ORAL | Status: DC
  Administered 2020-07-26 – 2020-08-05 (×22): 50 mg via ORAL
  Filled 2020-07-26 (×22): qty 2

## 2020-07-26 NOTE — [place] FL2 (Signed)
[place] MEDICAID FL2 LEVEL OF CARE SCREENING TOOL     IDENTIFICATION  Patient Name: Carl Hill Birthdate: 03/24/1971 Sex: male Admission Date (Current Location): 06/10/2020  [place] and [place] Number:  Haynes Bast [id number] Facility and Address:  [facility]. [facility], [address] [place], [place] [address]      Provider Number: [id number]  Attending Physician Name and Address:  Alba Cory, MD  Relative Name and Phone Number:  Giovanie Lefebre, mother - [phone]    Current Level of Care: Hospital Recommended Level of Care: Skilled Nursing Facility Prior Approval Number:    Date Approved/Denied:   PASRR Number: [id number]  Discharge Plan: SNF    Current Diagnoses: Patient Active Problem List   Diagnosis Date Noted  . Sepsis due to Streptococcus, group B (HCC) 06/22/2020  . 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 Acute respiratory failure (HCC)   . Sepsis (HCC) 06/10/2020  . Fournier gangrene 06/10/2020  . DKA (diabetic ketoacidosis) (HCC) 06/10/2020  . AKI (acute kidney injury) (HCC) 06/10/2020  . Diabetic ulcer of heel (HCC) 06/10/2020  . Atrial fibrillation, chronic (HCC) 06/10/2020  . Essential hypertension 06/10/2020  . Dyslipidemia 06/10/2020  . Acquired hypothyroidism 06/10/2020  . Chronic pain disorder 06/10/2020  . Morbid obesity with BMI of 60.0-69.9, adult (HCC) 06/10/2020    Orientation RESPIRATION BLADDER Height & Weight     Self,Time,Situation,Place  Normal (Decannulated tracheostomy on 07/26/2020 - remains on room air) External catheter Weight: (!) 247.6 kg Height:  5\' 10"  (177.8 cm)  BEHAVIORAL SYMPTOMS/MOOD NEUROLOGICAL BOWEL NUTRITION STATUS      Incontinent (Rectal tube at this current time in order to encourage healing of buttocks/perineal wounds) Diet (See discharge summary)  AMBULATORY STATUS COMMUNICATION OF NEEDS Skin   Total Care Verbally Other (Comment) (Perineal wound open/ unattached edges - wet to dry  dressing application 2 x per day with Santyl/ gauze/ABD drsg)                       Personal Care Assistance Level of Assistance  Bathing,Dressing Bathing Assistance: Limited assistance   Dressing Assistance: Maximum assistance     Functional Limitations Info  Sight,Hearing,Speech Sight Info: Adequate Hearing Info: Adequate Speech Info: Adequate    SPECIAL CARE FACTORS FREQUENCY  PT (By licensed PT),OT (By licensed OT)     PT Frequency: 5 x per week OT Frequency: 5 x per week            Contractures Contractures Info: Not present    Additional Factors Info  Code Status,Allergies Code Status Info: Full code Allergies Info: bee venom, codeine, SGLT2 Inhibitors           Current Medications (07/26/2020):  This is the current hospital active medication list Current Facility-Administered Medications  Medication Dose Route Frequency Provider Last Rate Last Admin  . 0.9 %  sodium chloride infusion   Intravenous PRN Hunsucker, 07/28/2020, MD   Stopped at 06/28/20 2035  . acetaminophen (TYLENOL) tablet 650 mg  650 mg Oral Q6H PRN Opyd, 2036, MD   650 mg at 07/25/20 2341   Or  . acetaminophen (TYLENOL) 160 MG/5ML solution 650 mg  650 mg Oral Q6H PRN Opyd, 2342, MD      . albuterol (PROVENTIL) (2.5 MG/3ML) 0.083% nebulizer solution 2.5 mg  2.5 mg Nebulization Q4H PRN Hunsucker, Lavone Neri, MD   2.5 mg at 07/05/20 1514  . amiodarone (PACERONE) tablet 200 mg  200 mg Oral Daily 09/02/20,  MD   200 mg at 07/26/20 1123  . amoxicillin-clavulanate (AUGMENTIN) 875-125 MG per tablet 1 tablet  1 tablet Oral Q8H Kathlen Mody, MD   1 tablet at 07/26/20 0550  . atorvastatin (LIPITOR) tablet 20 mg  20 mg Oral QPM Kathlen Mody, MD   20 mg at 07/25/20 1730  . chlorhexidine (PERIDEX) 0.12 % solution 15 mL  15 mL Mouth Rinse BID Hunsucker, Lesia Sago, MD   15 mL at 07/26/20 1128  . Chlorhexidine Gluconate Cloth 2 % PADS 6 each  6 each Topical Daily Lynnell Catalan, MD   6 each at  07/26/20 1049  . collagenase (SANTYL) ointment 1 application  1 application Topical Daily Coralyn Helling, MD   1 application at 07/25/20 1352  . COVID-19 mRNA vaccine (Moderna) injection 0.25 mL  0.25 mL Intramuscular Once Russella Dar, NP      . diltiazem (CARDIZEM CD) 24 hr capsule 240 mg  240 mg Oral Daily Zannie Cove, MD   240 mg at 07/26/20 1135  . feeding supplement (ENSURE ENLIVE / ENSURE PLUS) liquid 237 mL  237 mL Oral BID BM Kathlen Mody, MD   237 mL at 07/26/20 1128  . HYDROmorphone (DILAUDID) injection 1.5 mg  1.5 mg Intravenous TID PRN Hunsucker, Lesia Sago, MD   1.5 mg at 07/26/20 0824  . insulin aspart (novoLOG) injection 0-20 Units  0-20 Units Subcutaneous TID WC Opyd, Lavone Neri, MD   3 Units at 07/26/20 1125  . insulin aspart (novoLOG) injection 0-5 Units  0-5 Units Subcutaneous QHS Briscoe Deutscher, MD   2 Units at 07/14/20 2136  . insulin aspart (novoLOG) injection 18 Units  18 Units Subcutaneous TID WC Kathlen Mody, MD   18 Units at 07/25/20 1730  . insulin detemir (LEVEMIR) injection 42 Units  42 Units Subcutaneous BID Hunsucker, Lesia Sago, MD   42 Units at 07/26/20 1124  . levothyroxine (SYNTHROID) tablet 75 mcg  75 mcg Oral Daily Kathlen Mody, MD   75 mcg at 07/26/20 0550  . MEDLINE mouth rinse  15 mL Mouth Rinse q12n4p Hunsucker, Lesia Sago, MD   15 mL at 07/26/20 1129  . melatonin tablet 3 mg  3 mg Oral QHS PRN Kathlen Mody, MD   3 mg at 07/14/20 2126  . multivitamin with minerals tablet 1 tablet  1 tablet Oral Daily Kathlen Mody, MD   1 tablet at 07/26/20 1124  . nortriptyline (PAMELOR) capsule 50 mg  50 mg Oral BID Russella Dar, NP   50 mg at 07/26/20 1123  . ondansetron (ZOFRAN) injection 4 mg  4 mg Intravenous Q6H PRN Jonah Blue, MD   4 mg at 07/14/20 2127  . oxyCODONE (OXYCONTIN) 12 hr tablet 15 mg  15 mg Oral Q12H Russella Dar, NP   15 mg at 07/26/20 1124  . oxyCODONE (ROXICODONE) 5 MG/5ML solution 15 mg  15 mg Oral Q6H PRN Zannie Cove, MD   15  mg at 07/25/20 2122  . pantoprazole (PROTONIX) EC tablet 40 mg  40 mg Oral Daily Kathlen Mody, MD   40 mg at 07/26/20 1124  . polyethylene glycol (MIRALAX / GLYCOLAX) packet 17 g  17 g Oral Daily PRN Kathlen Mody, MD   17 g at 07/26/20 1126  . pregabalin (LYRICA) capsule 300 mg  300 mg Oral BID Kathlen Mody, MD   300 mg at 07/26/20 1123  . protein supplement (ENSURE MAX) liquid  11 oz Oral QHS Kathlen Mody, MD   11  oz at 07/24/20 2101  . senna-docusate (Senokot-S) tablet 2 tablet  2 tablet Oral Daily Regalado, Belkys A, MD   2 tablet at 07/26/20 1123  . sodium chloride flush (NS) 0.9 % injection 3 mL  3 mL Intravenous Q12H Jonah Blue, MD   3 mL at 07/26/20 1050  . warfarin (COUMADIN) tablet 3 mg  3 mg Oral ONCE-1600 Quenton Fetter, RPH      . Warfarin - Pharmacist Dosing Inpatient   Does not apply [id number] Coralyn Helling, MD   Given at 07/21/20 1454     Discharge Medications: Please see discharge summary for a list of discharge medications.  Relevant Imaging Results:  Relevant Lab Results:   Additional Information SS# [id number]  Janae Bridgeman, RN

## 2020-07-26 NOTE — Significant Event (Signed)
Rapid Response Event Note   Reason for Call :  Staff found partially removed trach  Initial Focused Assessment:  Patient is lying on his side in no distress and breathing comfortably.  He is talking to staff.  O2 sats 93-96% VSS  His trach is dislodged,  A significant portion of the trach is visible.  Interventions:  Contacted CCM Verbal order received to remove trach. RT remove trach and placed dressing  Plan of Care:  If patient needs oxygen place nasal cannula    Event Summary:   MD Notified: Devra Dopp Call Time: 0848 Arrival Time: 7517 End Time: 0930  Marcellina Millin, RN

## 2020-07-26 NOTE — Progress Notes (Signed)
NAME:  Carl Hill, MRN:  [id number], DOB:  1971/03/19, LOS: 51 ADMISSION DATE:  06/10/2020, CONSULTATION DATE:  06/10/2020 REFERRING MD:  Lorin Mercy, CHIEF COMPLAINT:  Severe Sepsis    Brief History:  50 year old male who presented with buttock pain, fatigue and malaise.  Found to have hyperglycemia. CT pelvis demonstrated necrotizing fasciitis in L buttock, L upper thigh, and perineum. Taken to OR for debridement 1/10 c/b Afib with RVR and VT arrest s/p CPR/defib. Trached 1/26, remains on trach collar   Past Medical History:  HTN, obesity, DM, AFib on [place] Hospital Events:  1/10  Admitted to [facility]. General surgery consulted and debridement pursued. In the afternoon, patient developed hypotension with AF in RVR. Synchronized cardioversion unsuccessful with conversion to VT. CPR initated with ROSC after desynchronized defibrillation. 1/11 Debridement 1/18 Mucus plugging 1/19 Bronchoscopy >> mucus plug on Rt, airway collapse with exhalation 1/20 Persistent fever, change ABx 1/24 Increased vent requirements, habitus related +/- mucus plugging 1/25 Hypotensive to SBP 80s, Levo resumed, minimally responsive overnight off sedation, CT Head negative 1/26 Stable vent/pressor requirements, bronched, tracheostomy 1/29 start hydrotherapy 1/30 start lovenox/coumadin 2/2 Tolerating PS 2/4 did TC for 12 hours 2/5 Did 24 hours TC 2/6 Continue TC  2/7 Try dilaudid with turns/dressing change for better pain control - longer acting, fentanyl IV d/c'd 2/8 Increase standing oxy, dilaudid PRN for turns/dressing changes, downgrade to progressive 2/15 trach changed to 6 cuffless  Consults:  General Surgery ID s/o 1/25  Procedures:  ETT 1/10 >> 1/26 1/26 Tstomy >> R IJ CVL 1/10 >> out L brachial A-line 1/10 >>out   Significant Diagnostic Tests:   CT abd/pelvis 1/10 >> Extensive subcutaneous emphysema in the medial aspect of the upper thighs extending through perineum and superiorly to  the medial left buttock. Gas extends into the pelvis surrounding the anus inferiorly and tracking superiorly along the left wall of the rectum.  CT C/A/P 1/22 >> Imaging limited due to body habitus. No gross fluid collection or gas seen. Bilateral consolidation with dependent atelectasis  CT Head 1/25 >> No acute intracranial abnormality  Micro Data:  COVID/Flu 1/10 >> negative MRSA PCR 1/10 >> negative L buttocks wound 1/10 >> E. Coli (pansensitive), Streptococcus infantarious (sensitive to PCN, ceftriaxone, vanc), Peptostreptococcus species, rare GBS Blood 1/10 >> negative BAL 1/20 >> normal flora Resp Cx 1/26 > Normal flora  Antimicrobials:  Aztreonam 1/09 Metronidazole 1/09 Vancomycin 1/09, 1/11, 1/20 >>1/22 Cefepime 1/10 >> 1/13, 1/20 >>1/22 Clindamycin 1/10 >> 1/13 Ceftriaxone 1/13 >> 1/20 Metronidazole 1/17 >> 1/22 Meropenem 1/22 >> 1/25 Amoxicillin 1/24 (x 1 for PCN Allergy challenge) Unasyn 1/25 >> 1/27 Augmentin 1/27 >>  Interim History / Subjective:  No acute distress   Objective   Blood pressure 140/88, pulse 96, temperature 98.1 F (36.7 C), temperature source Oral, resp. rate 19, height _0  (1.778 m), weight (!) 247.6 kg, SpO2 100 %.    FiO2 (%):  [28 %] 28 %   Intake/Output Summary (Last 24 hours) at 07/26/2020 1117 Last data filed at 07/26/2020 7628 Gross per 24 hour  Intake 1180 ml  Output 7250 ml  Net -6070 ml   Filed Weights   07/24/20 0237 07/25/20 0426 07/26/20 0500  Weight: (!) 246 kg (!) 246.8 kg (!) 247.6 kg    General: Morbidly obese male no acute distress HEENT: MM pink/moist tracheostomy is out Vaseline gauze dressing is in place Neuro: CN intact without focal defect 4+  CV: Sounds are regular PULM:  Diminished throughout GI: soft, bsx4 active  Extremities: warm/dry, 2+ edema  Skin: no rashes or lesions    Resolved problems:  VT arrest 1/10, Ileus 1/15, Elevated LFTs from shock, Metabolic acidosis with lactic acidosis, Septic  shock Acute metabolic encephalopathy 2nd to sepsis, hypoxia, hypercapnia.\ Hypernatremia from diuresis and insensible losses - improved Trach removed 2/25   Assessment & Plan:   Acute on chronic hypoxic/hypercapnic respiratory failure in setting of sepsis, HCAP, recurrent mucus plugging  Failure to wean s/p tracheostomy 1/26 - downsized to 6 cuffless on 2/15. Bronchomalacia. Probable sleep disordered breathing.    Cannulated 07/26/2020 as the trach was almost totally out. 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 Bleeding from sacral site - no further issue. Per general surgery  Acute blood loss anemia, likely from frequent blood draws Per [facility]  Paroxysmal Afib - currently NSR Hx of HLD. Per try   DM type 2 poorly controlled with hyperglycemia. Hypothyroidism. 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  Per [facility]  PCCM will follow as needed   Richardson Landry Devan Babino ACNP Acute Care Nurse Practitioner [place] Please consult Amion 07/26/2020, 11:17 AM

## 2020-07-26 NOTE — Progress Notes (Signed)
PT Cancellation Note  Patient Details Name: Carl Hill MRN: [id number] DOB: Feb 20, 1971   Cancelled Treatment:    Reason Eval/Treat Not Completed: (P) Medical issues which prohibited therapy When evaluating pt prior to start of PT pt noted to have trach dislodged. Notified RN, who notified Rapid Response and Respiratory therapy. PT will attempt to come back this afternoon for treatment as able.   Simra Fiebig B. Beverely Risen PT, DPT Acute Rehabilitation Services Pager [phone] Office [phone]    Elon Alas [facility] 07/26/2020, 9:19 AM

## 2020-07-26 NOTE — TOC Progression Note (Signed)
Transition of Care [facility]) - Progression Note    Patient Details  Name: Carl Hill MRN: [id number] Date of Birth: 1971-05-05  [facility] CM/SW Contact  Curlene Labrum, RN Phone Number: 07/26/2020, 1:23 PM  Clinical Narrative:    Case management met with the patient at the bedside regarding transitions of care.  The patient's mother is present in the room upon arrival for assessment.  The patient states that his tracheostomy fell out this morning and remains out with patient on room air - MD aware and orders standing for trach to remain out since patient is doing well and oxygen saturation is 98% on Room air with gauze covering old tracheostomy site.  The patient has large wet-to-dry gauze/ABD dressing present over his buttocks wound.  Rectal tube/ pouch in place and draining to gravity.  The patient also has a present external urinary catheter - both to protect buttocks wound and promote healing at this time.  The patient will need a bariatric bed available for CIR or SNF placement, since patient is presently 545 lbs.    The patient is currently in a Bariatric Specialty Kreg bed monitoring of blood pressure readings due to orthostatic hypotension.  CIR is continuing to evaluate the patient for possible admission once this is resolved.  I spoke with the patient and family at the bedside and offered SNF placement at a backup plan to CIR if not available and both were agreeable.  FL2 was completed to be signed by the physician and faxed patient out in the hub as a backup plan to CIR - once medically ready.  The patient is currently fully vaccinated for COVID, influenza and pneumonia at this time, and should receive a COVID booster as ordered by the physician.  CM and MSW will continue to follow the patient for CIR versus SNF placement when medically ready.  Expected Discharge Plan: [place] (CIR versus SNF placement) Barriers to Discharge: Continued Medical  Work up,SNF Pending bed offer (Bariatric bed need - weight 545 lbs)  Expected Discharge Plan and Services Expected Discharge Plan: [place] (CIR versus SNF placement) In-house Referral: Clinical Social Work,Nutrition Discharge Planning Services: CM Consult Post Acute Care Choice: [place] Living arrangements for the past 2 months: Single Family Home                                       Social Determinants of Health (SDOH) Interventions    Readmission Risk Interventions Readmission Risk Prevention Plan 07/26/2020  Transportation Screening Complete  PCP or Specialist Appt within 3-5 Days Complete  HRI or [place] Complete  Social Work Consult for [place] Planning/Counseling Complete  Palliative Care Screening Complete  Medication Review Press photographer) Complete  Some recent data might be hidden

## 2020-07-26 NOTE — Progress Notes (Signed)
Physical Therapy Treatment Patient Details Name: Carl Hill MRN: [id number] DOB: Mar 13, 1971 Today's Date: 07/26/2020    History of Present Illness Pt is a 50 year old male who presented with buttock pain, fatigue and malaise.  Found to have hyperglycemia. CT pelvis demonstrated necrotizing fasciitis in L buttock, L upper thigh, and perineum. Taken to OR for debridement 1/10 c/b Afib with RVR and VT arrest s/p CPR/defib. Trached 1/26 PMHx: HTN, obesity, DM, AFib.  As of 07/04/20 started TC trials.    PT Comments    Pt in fact decannulated this morning. Revisited immediately after dressing change this afternoon. Therapy session limited by increased pain in lying on his back after procedure. Agreeable to work in sidelying. Using reverse trendelenberg able to tilt bed to 15 degrees where pt able to perform mini squats for 5 minutes and attempted single leg stance. Pt tolerated 30 minutes and was placed back in bed flat position. Given BP orthostasis is resolving goal for Monday is getting to EoB. D/c plans remain appropriate. PT will continue to follow acutely.   Follow Up Recommendations  CIR     Equipment Recommendations  Wheelchair (measurements PT);Wheelchair cushion (measurements PT);Hospital bed;Other (comment);3in1 (PT) (hoyer lift, bariatric equipment)    Recommendations for Other Services Rehab consult     Precautions / Restrictions Precautions Precautions: Fall Precaution Comments: rectal tube, large buttocks wound, hydro on hold Restrictions Weight Bearing Restrictions: No    Mobility  Bed Mobility Overal bed mobility: Needs Assistance Bed Mobility: Rolling Rolling: Supervision         General bed mobility comments: extra time needed, heavy use of rail. Able to complete rolling, moving up in the bed, and centering in bed all with only supervision level              Cognition Arousal/Alertness: Awake/alert Behavior During Therapy: WFL for tasks  assessed/performed Overall Cognitive Status: Within Functional Limits for tasks assessed                                        Exercises General Exercises - Lower Extremity Ankle Circles/Pumps: AROM;Both;20 reps;Sidelying Quad Sets: 20 reps;Standing Mini-Sqauts: Other reps (comment) (15 degrees able to perform for 5 minutes) Other Exercises Other Exercises: sidelying single leg stance at 15 degree tilt    General Comments General comments (skin integrity, edema, etc.): Unable to attempt tilting bed due to Kreg bed rep not present      Pertinent Vitals/Pain Pain Assessment: Faces Faces Pain Scale: Hurts whole lot Pain Location: buttocks and back spasms with exercise Pain Descriptors / Indicators: Guarding;Burning;Discomfort;Pins and needles;Shooting;Tingling Pain Intervention(s): Limited activity within patient's tolerance;Monitored during session;Repositioned           PT Goals (current goals can now be found in the care plan section) Acute Rehab PT Goals Patient Stated Goal: to get stronger, try sitting up EOB, get back to walking with RW PT Goal Formulation: With patient Time For Goal Achievement: 07/29/20 Potential to Achieve Goals: Fair Progress towards PT goals: Progressing toward goals    Frequency    Min 3X/week      PT Plan Current plan remains appropriate       AM-PAC PT "6 Clicks" Mobility   Outcome Measure  Help needed turning from your back to your side while in a flat bed without using bedrails?: A Little Help needed moving from lying on your back to  sitting on the side of a flat bed without using bedrails?: Total Help needed moving to and from a bed to a chair (including a wheelchair)?: Total Help needed standing up from a chair using your arms (e.g., wheelchair or bedside chair)?: Total Help needed to walk in hospital room?: Total Help needed climbing 3-5 steps with a railing? : Total 6 Click Score: 8    End of Session  Equipment Utilized During Treatment: Oxygen Activity Tolerance: Patient limited by pain;Patient tolerated treatment well Patient left: in bed;with call bell/phone within reach;with family/visitor present;Other (comment) (placed in reverse trendelenberg (15 degree) elevation to increase tolerance to upright) Nurse Communication: Mobility status PT Visit Diagnosis: Muscle weakness (generalized) (M62.81);Other abnormalities of gait and mobility (R26.89)     Time: 1540-1620 PT Time Calculation (min) (ACUTE ONLY): 40 min  Charges:  $Therapeutic Exercise: 8-22 mins $Therapeutic Activity: 38-52 mins                     Joran Kallal B. Beverely Risen PT, DPT Acute Rehabilitation Services Pager [phone] Office [phone]    Elon Alas [facility] 07/26/2020, 4:49 PM

## 2020-07-26 NOTE — Progress Notes (Signed)
[facility] PROGRESS NOTE  Pattricia BossJonathan D Rampy WUJ:[id number] DOB: 1971/05/18 DOA: 06/10/2020 PCP: Malka SoJobe, Daniel B., MD  Status: Remains inpatient appropriate because:Altered mental status, Unsafe d/c plan and Inpatient level of care appropriate due to severity of illness   Dispo:               The patient is from: Home              Anticipated d/c is to: SNF vs CIR (patient needs to be able to tolerate a partially standing/sitting position for at least 60 minutes before can be reconsidered for inpatient rehabilitation)              Patient currently is not medically stable to d/c.  Patient continues to have intermittent orthostatic hypotension with efforts to stand utilizing specialty bed.  In addition patient has a very large deep combine decubitus involving the buttocks, sacrum, coccyx as well as wounds on both lower extremities.  These wounds occurred in the context of necrotizing soft tissue injury/fasciitis.  Patient is morbidly obese greater than 500 pounds and requires multiple providers for repositioning and providing wound care twice daily.              Barriers to DC: Morbid obesity, need for aggressive wound care, continued issues with intermittent orthostatic hypotension with activity and physical therapy.  Is very motivated to improve and return to the home environment   Difficult to place patient Yes   Level of care: Telemetry Medical  Code Status: Full Family Communication:  DVT prophylaxis: Warfarin Vaccination status: Fully vaccinated as of June 2021/Moderna; discussed with patient and on 2/25 Moderna booster shot given  Foley catheter: 16 Fr Foley placed 07/4399  HPI: 50 year old male with history of morbid obesity, hypertension, diabetes with peripheral lower extremity neuropathy, atrial fibrillation.  He initially presented to the ER on 1/10 with reports of hyperglycemia with glucose greater than 406.  He was symptomatic with dizziness lightheadedness and headache.  He  reports chronic lower extremity wounds on the left heel the recent left calf skin tear.  In route to the hospital he sustained a skin tear of his right calf.  His mother had also noticed a boil on his buttocks on Thursday.  Patient at baseline is ambulatory with a cane but due to current illness mother has been assisting with his care including pericare.  On exam in the ER patient was found to have what appeared to be significant necrotizing soft tissue infection/injury to the buttocks, perineum and upper thigh regions.  Surgery was urgently consulted and patient was taken to the OR.  See below regarding other significant events.  Since that time patient has transitioned out of the ICU initially to the progressive care unit and as of the past 24 hours to 6 N. surgical unit.  Patient had been stable on a trach collar and accidentally decannulated himself this morning.  PCCM was notified and recommended leaving the trach out and patient has done well since that time.  Patient had been undergoing aggressive wound care to include hydrotherapy with significant improvement in the wound base and resolution of necrotic and other debris.  He is now tolerating saline packing wound dressings to all of his wounds twice daily.  He continues to have significant pain in the decubitus area as well as worsening neuropathy pain in both feet as PT has worked with him and he is starting to do some partial weightbearing.  Patient very talkative and animated today  on 2/25 and is very eager to continue therapy and eventually return home.  Significant Hospital events: 1/10 Admitted to [facility] with sepsis, necrotizing fasciitis. General surgery consulted and debridement pursued. In the afternoon, patient developed hypotension with AF in RVR. Synchronized cardioversion unsuccessful with conversion to VT. CPR initated with ROSC after desynchronized defibrillation. 1/11 Debridement 1/18 Mucus plugging 1/19 Bronchoscopy >> mucus plug on  Rt, airway collapse with exhalation 1/20 Persistent fever, change ABx 1/24 Increased vent requirements, habitus related +/- mucus plugging 1/25 Hypotensive to SBP 80s, Levo resumed, minimally responsive overnight off sedation, CT Head negative 1/26 Stable vent/pressor requirements, bronched, tracheostomy 1/29 start hydrotherapy 1/30 start lovenox/coumadin 2/2 Tolerating PS 2/4 did TC for 12 hours 2/5 Did 24 hours TC 2/6 Continue TC  2/7 Try dilaudid with turns/dressing change for better pain control - longer acting, fentanyl IV d/c'd 2/8 Increase standing oxy, dilaudid PRN for turns/dressing changes, downgrade to progressive 2/15 trach changed to # 6 cuffless    Subjective: Awake, continues to have pain in legs and sacral/upper thigh wounds.  He reports this pain and during conversation it is noted that he is having some tearing of his eyes during conversation.  Objective: Vitals:   07/26/20 0450 07/26/20 0606  BP:  140/88  Pulse: 96 96  Resp: 12 19  Temp:  98.1 F (36.7 C)  SpO2: 99% 100%    Intake/Output Summary (Last 24 hours) at 07/26/2020 0856 Last data filed at 07/26/2020 1610 Gross per 24 hour  Intake 1770 ml  Output 7250 ml  Net -5480 ml   Filed Weights   07/24/20 0237 07/25/20 0426 07/26/20 0500  Weight: (!) 246 kg (!) 246.8 kg (!) 247.6 kg    Exam:  Constitutional: NAD, calm, uncomfortable can [place] to pain from multiple wounds Neck: Obese, dry dressing is over recent trach site-wheezing noises audible and it is noted nursing staff have yet to place Vaseline gauze over this area which has been ordered Respiratory: clear to auscultation bilaterally, no wheezing, no crackles. Normal respiratory effort. No accessory muscle use.  Cardiovascular: Regular rate and rhythm, no murmurs / rubs / gallops. No extremity edema.  Skin otherwise warm and dry Abdomen: no tenderness, no masses palpated.Bowel sounds positive.  Flexi-Seal tube in place to manage stools to prevent  contamination of wound Musculoskeletal: no clubbing / cyanosis. No joint deformity upper and lower extremities. Good ROM, no contractures. Normal muscle tone.  Skin: Multiple wounds involving the posterior surface of the legs as well as upper posterior thighs, buttocks, perineum and sacrum.  Please see pictures in chart. Neurologic: CN 2-12 grossly intact. Sensation intact, DTR normal. Strength 4/5 x all 4 extremities.  Able to roll self in bed using upper extremities but this does cause significant discomfort Psychiatric: Normal judgment and insight. Alert and oriented x 3. Normal mood.    Assessment/Plan: Acute problems: Acute on Chronic Hypoxic and Hypercapnic Respiratory Failure in the setting of Sepsis, Morbid obesity, healthcare associated pneumonia, recurrent mucous plugging; -Sepsis physiology has resolved and PNA adequately treated -Prolonged respiratory failure requiring mechanical ventilation, failed attempt at extubation. -Status post tracheostomy on 1/26, wean off ventilator.  With subsequent downgrade to cuffless #6 trach on 2/15.  As of 2/25 patient accidentally self decannulated and decision made to leave tracheostomy tube out -Continue Vaseline gauze dressing covered by dry dressing over trach site until site closed -Once trach site closed patient will likely benefit from CPAP to use at bedtime and as needed given likelihood of underlying sleep  apnea  Sepsis 2/2 Left buttock necrotizing fasciitis with E. coli, group B strep, Peptostreptococcus in wound culture: SEPSIS RESOLVED -Excisional debridement of perianal, gluteal and perineal necrotizing soft tissue infection by Dr. Freida Busman 1/10 -Back to OR 1/11 and excisional debridement of perineal necrosis, by Dr. Freida Busman. -Received hydrotherapy from 06/29/2020 until 07/04/2020. -No further debridement.  General surgery recommended wound care to follow-up. 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  Patient currently on Augmentin. -As of 2/25 CRP remains elevated at 4.6, sed rate 138 -Continue with wound care and dressing changes. 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 Fib/acquired thrombophilia: VR Controlled.   Continue with amiodarone and diltiazem. Preadmission Xarelto discontinued per pharmacy recommendation given patient's body weight greater than 500 pounds Continue with Coumadin-INR therapeutic at 2.5  Diarrhea:  Laxative, has rectal tube to protect the wound. Change Senokot to daily Patient does have issues sometimes with stool becoming dry and hard and benefits from flushing of Flexi-Seal so order placed on 2/25  Pressure injury present on admission in context necrotizing skin infection/injury (Coccyx/sacrum/buttocks/perinuem/pretibial See documentation from wound care nurse Incision (Closed) 06/10/20 Coccyx Other (Comment) (Active)  Date First Assessed/Time First Assessed: 06/10/20 1250   Location: Coccyx  Location Orientation: Other (Comment)    Assessments 06/10/2020  2:30 PM 07/25/2020  9:22 PM  Dressing Type Gauze  (Comment) Moist to dry;Gauze (Comment)  Site / Wound Assessment -- Dressing in place / Unable to assess  Drainage Amount -- None     No Linked orders to display     Wound / Incision (Open or Dehisced) 06/10/20 Skin tear Pretibial Right;Anterior (Active)  Date First Assessed/Time First Assessed: 06/10/20 1400   Wound Type: Skin tear  Location: Pretibial  Location Orientation: Right;Anterior  Present on Admission: Yes    Assessments 06/10/2020  2:30 PM 07/25/2020  5:16 PM  Dressing Type Petroleum Gauze (Comment)  Dressing Changed Changed Changed  Dressing Status Clean;Dry;Intact Clean;Dry;Intact  Dressing Change Frequency -- Twice a day     No Linked orders to display     Pressure Injury 06/10/20 Heel Right Stage 2 -  Partial thickness loss of dermis presenting as a shallow open injury with a red, pink wound bed without slough. (Active)  Date First Assessed/Time First Assessed: 06/10/20 1400   Location: Heel  Location Orientation: Right  Staging: Stage 2 -  Partial thickness  loss of dermis presenting as a shallow open injury with a red, pink wound bed without slough.  Present on Admis...    Assessments 06/10/2020  2:30 PM 07/25/2020  9:22 PM  Dressing Type Foam - Lift dressing to assess site every shift None  Dressing Clean;Dry;Intact --     No Linked orders to display     Incision (Closed) 06/11/20 Perineum (Active)  Date First Assessed/Time First Assessed: 06/11/20 1525   Location: Perineum    Assessments 06/11/2020  8:00 PM 07/25/2020  2:00 PM  Dressing Type Gauze (Comment);Tape dressing Abdominal pads;Moist to dry;Santyl  Dressing Intact Changed;Clean;Dry;Intact  Dressing Change Frequency Daily Daily  Site / Wound Assessment -- Pink;Red;Yellow;Bleeding  Margins -- Unattached edges (unapproximated)  Closure -- None  Drainage Amount -- Minimal  Drainage Description -- Odor;Serosanguineous  Treatment -- Cleansed     No Linked orders to display     Wound / Incision (Open or  Dehisced) 06/10/20 Skin tear Pretibial Left (Active)  Date First Assessed/Time First Assessed: 06/10/20 1200   Wound Type: Skin tear  Location: Pretibial  Location Orientation: Left  Present on Admission: Yes    Assessments 06/11/2020  8:00 PM 07/24/2020  2:00 AM  Dressing Type Impregnated gauze (petrolatum);Abdominal pads;Gauze (Comment) Other (Comment)  Dressing Changed -- Changed  Dressing Change Frequency Twice a day Twice a day  Site / Wound Assessment -- Red;Yellow;Bleeding  Closure -- None  Treatment -- Cleansed     No Linked orders to display     Wound / Incision (Open or Dehisced) 06/29/20 Buttocks (Active)  Date First Assessed/Time First Assessed: 06/29/20 1148   Location: Buttocks    Assessments 06/29/2020  2:53 PM 07/24/2020  2:00 AM  Wound Image     Dressing Type Abdominal pads;Barrier Film (skin prep);Gauze (Comment);Moist to moist Moist to dry  Dressing Changed Changed Changed  Dressing Status Dry;Clean;Intact --  Dressing Change Frequency Daily Twice a day  Site / Wound Assessment Bleeding;Black;Red;Yellow Bleeding;Red;Yellow  % Wound base Red or Granulating 60% --  % Wound base Yellow/Fibrinous Exudate 30% --  % Wound base Black/Eschar 10% --  % Wound base Other/Granulation Tissue (Comment) 0% --  Peri-wound Assessment Intact --  Wound Length (cm) 25 cm --  Wound Width (cm) 11 cm --  Wound Depth (cm) 10 cm --  Wound Volume (cm^3) 2750 cm^3 --  Wound Surface Area (cm^2) 275 cm^2 --  Margins Unattached edges (unapproximated) --  Drainage Amount Copious --  Drainage Description Serosanguineous --  Treatment Debridement (Selective);Hydrotherapy (Pulse lavage);Packing (Saline gauze) Cleansed     No Linked orders to display   Nutrition Status: Nutrition Problem: Increased nutrient needs Etiology: wound healing Signs/Symptoms: estimated needs Interventions: Ensure Enlive (each supplement provides 350kcal and 20 grams of protein),MVI,Premier Protein,Magic  cup Estimated body mass index is 78.32 kg/m as calculated from the following:   Height as of this encounter: 5\' 10"  (1.778 m).   Weight as of this encounter: 247.6 kg.    Other problems: hypernatremia  from diuresis and insensible losses: Resolved  Acute blood loss anemia  from blood loss from wound:  Status post blood transfusion. Hb  stable  Hypothyroidism:  Continue with Synthroid  Edema: Continue with Lipitor Intermittent blurry vision:  Could be related to hypotension versus narcotics,  Also happens at home.    Data Reviewed: Basic Metabolic Panel: Recent Labs  Lab 07/21/20 0257 07/23/20 0253 07/24/20 0808 07/25/20 0815  NA 138 135 136 135  K 4.1 3.3* 3.4* 3.7  CL 92* 92* 94* 94*  CO2 34* GLUCOSE 131* 159* 138* 125*  BUN CREATININE 0.93 0.96 0.98 1.02  CALCIUM 8.8* 8.4* 8.3* 8.4*   Liver Function Tests: No results for input(s): AST, ALT, ALKPHOS, BILITOT, PROT, ALBUMIN in the last 168 hours. No results for input(s): LIPASE, AMYLASE in the last 168 hours. No results for input(s): AMMONIA in the last 168 hours. CBC: Recent Labs  Lab 07/21/20 0257 07/23/20 0253 07/24/20 0808 07/25/20 0439  WBC 7.3 7.0 6.9 7.2  HGB 9.0* 9.0* 8.5* 9.0*  HCT 29.1* 28.0* 27.7* 29.5*  MCV 88.2 86.4 88.2 88.1  PLT 243 244 258 290   Cardiac Enzymes: No results for input(s): CKTOTAL, CKMB, CKMBINDEX, TROPONINI in the last 168 hours. BNP (last 3 results) No results for input(s): BNP in the last 8760 hours.  ProBNP (last 3 results) No results for input(s): PROBNP in the last 8760 hours.  CBG: Recent Labs  Lab 07/25/20 0814 07/25/20 1234 07/25/20 1614 07/25/20 2112 07/26/20 0722  GLUCAP 135* 175* 149* 122* 121*    No results found for this or any previous visit (from the past 240 hour(s)).   Studies: No results found.  Scheduled Meds: . amiodarone  200 mg Oral Daily  . amoxicillin-clavulanate  1 tablet Oral Q8H  . atorvastatin  20 mg Oral  QPM  . chlorhexidine  15 mL Mouth Rinse BID  . Chlorhexidine Gluconate Cloth  6 each Topical Daily  . collagenase  1 application Topical Daily  . diltiazem  240 mg Oral Daily  . feeding supplement  237 mL Oral BID BM  . insulin aspart  0-20 Units Subcutaneous TID WC  . insulin aspart  0-5 Units Subcutaneous QHS  . insulin aspart  18 Units Subcutaneous TID WC  . insulin detemir  42 Units Subcutaneous BID  . levothyroxine  75 mcg Oral Daily  . mouth rinse  15 mL Mouth Rinse q12n4p  . multivitamin with minerals  1 tablet Oral Daily  . nortriptyline  25 mg Oral BID  . pantoprazole  40 mg Oral Daily  . pregabalin  300 mg Oral BID  . Ensure Max Protein  11 oz Oral QHS  . senna-docusate  2 tablet Oral Daily  . sodium chloride flush  3 mL Intravenous Q12H  . Warfarin - Pharmacist Dosing Inpatient   Does not apply q1600   Continuous Infusions: . sodium chloride Stopped (06/28/20 2035)    Principal Problem:   Fournier gangrene Active Problems:   Sepsis (HCC)   DKA (diabetic ketoacidosis) (HCC)   AKI (acute kidney injury) (HCC)   Diabetic ulcer of heel (HCC)   Atrial fibrillation, chronic (HCC)   Essential hypertension   Dyslipidemia   Acquired hypothyroidism   Chronic pain disorder   Morbid obesity with BMI of 60.0-69.9, adult (HCC)   Acute respiratory failure (HCC)   Sepsis due to Streptococcus, group B (HCC)   Actinomycosis   Consultants:  General surgery  PCCM  Nephrology  Infectious disease  Procedures:  Irrigation and debridement of abscesses on 1/10 and 1/11  Antibiotics: Anti-infectives (From admission, onward)   Start     Dose/Rate Route Frequency Ordered Stop   07/12/20 1400  amoxicillin-clavulanate (AUGMENTIN) 875-125 MG per tablet 1 tablet        1 tablet Oral Every 8 hours 07/12/20 1003     06/27/20 1400  amoxicillin-clavulanate (AUGMENTIN) 875-125 MG per tablet 1 tablet  Status:  Discontinued  1 tablet Per Tube Every 8 hours 06/27/20 0939  07/12/20 1003   06/24/20 1615  Ampicillin-Sulbactam (UNASYN) 3 g in sodium chloride 0.9 % 100 mL IVPB  Status:  Discontinued        3 g 200 mL/hr over 30 Minutes Intravenous Every 6 hours 06/24/20 1517 06/27/20 0939   06/24/20 1300  amoxicillin (AMOXIL) capsule 500 mg        500 mg Oral  Once 06/24/20 1241 06/24/20 1256   06/24/20 1115  amoxicillin (AMOXIL) capsule 500 mg  Status:  Discontinued        500 mg Oral  Once 06/24/20 1025 06/24/20 1241   06/22/20 1415  meropenem (MERREM) 2 g in sodium chloride 0.9 % 100 mL IVPB  Status:  Discontinued        2 g 200 mL/hr over 30 Minutes Intravenous Every 8 hours 06/22/20 1324 06/24/20 1517   06/22/20 1400  meropenem (MERREM) 1 g in sodium chloride 0.9 % 100 mL IVPB  Status:  Discontinued        1 g 200 mL/hr over 30 Minutes Intravenous Every 8 hours 06/22/20 1248 06/22/20 1324   06/22/20 1300  doxycycline (VIBRAMYCIN) 100 mg in sodium chloride 0.9 % 250 mL IVPB  Status:  Discontinued        100 mg 125 mL/hr over 120 Minutes Intravenous Every 12 hours 06/22/20 1212 06/22/20 1248   06/20/20 1100  vancomycin (VANCOCIN) 2,500 mg in sodium chloride 0.9 % 500 mL IVPB  Status:  Discontinued        2,500 mg 250 mL/hr over 120 Minutes Intravenous Every 12 hours 06/20/20 1007 06/22/20 0813   06/20/20 1100  ceFEPIme (MAXIPIME) 2 g in sodium chloride 0.9 % 100 mL IVPB  Status:  Discontinued        2 g 200 mL/hr over 30 Minutes Intravenous Every 8 hours 06/20/20 1007 06/22/20 1207   06/17/20 1130  metroNIDAZOLE (FLAGYL) IVPB 500 mg  Status:  Discontinued        500 mg 100 mL/hr over 60 Minutes Intravenous Every 8 hours 06/17/20 1041 06/22/20 1248   06/13/20 1400  cefTRIAXone (ROCEPHIN) 2 g in sodium chloride 0.9 % 100 mL IVPB  Status:  Discontinued        2 g 200 mL/hr over 30 Minutes Intravenous Every 24 hours 06/13/20 1242 06/20/20 0944   06/11/20 1300  vancomycin (VANCOREADY) IVPB 2000 mg/400 mL        2,000 mg 200 mL/hr over 120 Minutes Intravenous   Once 06/11/20 1212 06/11/20 1824   06/10/20 2000  clindamycin (CLEOCIN) IVPB 900 mg        900 mg 100 mL/hr over 30 Minutes Intravenous Every 8 hours 06/10/20 1307 06/13/20 1500   06/10/20 1600  ceFEPIme (MAXIPIME) 2 g in sodium chloride 0.9 % 100 mL IVPB  Status:  Discontinued        2 g 200 mL/hr over 30 Minutes Intravenous Every 12 hours 06/10/20 1504 06/13/20 1242   06/10/20 1400  metroNIDAZOLE (FLAGYL) IVPB 500 mg  Status:  Discontinued        500 mg 100 mL/hr over 60 Minutes Intravenous Every 8 hours 06/10/20 1023 06/10/20 1308   06/10/20 1300  aztreonam (AZACTAM) 2 g in sodium chloride 0.9 % 100 mL IVPB  Status:  Discontinued        2 g 200 mL/hr over 30 Minutes Intravenous Every 8 hours 06/10/20 1033 06/10/20 1504   06/10/20 1215  clindamycin (CLEOCIN) IVPB  900 mg        900 mg 100 mL/hr over 30 Minutes Intravenous To Surgery 06/10/20 1210 06/10/20 1210   06/10/20 1033  vancomycin variable dose per unstable renal function (pharmacist dosing)  Status:  Discontinued         Does not apply See admin instructions 06/10/20 1033 06/12/20 1252   06/10/20 0600  metroNIDAZOLE (FLAGYL) tablet 500 mg  Status:  Discontinued        500 mg Oral Every 8 hours 06/10/20 0406 06/10/20 0948   06/10/20 0415  aztreonam (AZACTAM) 2 g in sodium chloride 0.9 % 100 mL IVPB        2 g 200 mL/hr over 30 Minutes Intravenous  Once 06/10/20 0406 06/10/20 0546   06/10/20 0415  vancomycin (VANCOCIN) IVPB 1000 mg/200 mL premix  Status:  Discontinued        1,000 mg 200 mL/hr over 60 Minutes Intravenous  Once 06/10/20 0406 06/10/20 0412   06/10/20 0415  vancomycin (VANCOCIN) 2,500 mg in sodium chloride 0.9 % 500 mL IVPB        2,500 mg 250 mL/hr over 120 Minutes Intravenous  Once 06/10/20 0412 06/10/20 4098        Time spent: 60 minutes    Junious Silk ANP  [facility] 7 am - 330 pm/M-F for direct patient care and secure chat Please refer to Amion for contact info 46  days

## 2020-07-26 NOTE — Progress Notes (Signed)
ANTICOAGULATION CONSULT NOTE  Pharmacy Consult for Warfarin  Indication: atrial fibrillation  Patient Measurements: Height: 5\' 10"  (177.8 cm) Weight: (!) 247.6 kg (545 lb 13.7 oz) IBW/kg (Calculated) : 73 Heparin Dosing Weight: 149.7 kg  Vital Signs: Temp: 98.1 F (36.7 C) (02/25 0606) Temp Source: Oral (02/25 0606) BP: 140/88 (02/25 0606) Pulse Rate: 96 (02/25 0606)  Labs: Recent Labs    07/24/20 0402 07/24/20 07/26/20 07/25/20 0439 07/25/20 0815 07/26/20 0131  HGB  --  8.5* 9.0*  --   --   HCT  --  27.7* 29.5*  --   --   PLT  --  258 290  --   --   LABPROT 30.7*  --  27.0*  --  26.2*  INR 3.1*  --  2.6*  --  2.5*  CREATININE  --  0.98  --  1.02  --     Estimated Creatinine Clearance: 178.9 mL/min (by C-G formula based on SCr of 1.02 mg/dL).   Assessment: 49YOM admitted with septic shock secondary to necrotizing fascitis. Was on Xarelto PTA, but that is not appropriate based on patient's weight. Pharmacy has been consulted to dose warfarin.  Patient sensitive to Warfarin in setting of drug interactions with Amiodarone and Augmentin resulting in Warfarin being held intermittently for high INR. Patient intake 100% with every meal. Warfarin most recently held 2/20 to 2/22 after ~5 mg/day average resulted in elevated INR of 3.9. Warfarin was resumed again on 2/23.   Today, INR is therapeutic and stable at 2.5 after 1mg  on 2/23 and 4mg  on 2/24. Augmentin to continue for 6 months. Also remains on oral Amiodarone. CBC is stable. No overt bleeding reported.   Goal of Therapy:  INR 2-3 Monitor platelets by anticoagulation protocol: Yes  Plan:  Coumadin 3mg  PO x1 Daily INR and weekly CBC Monitor for s/sx of bleeding  3/23, PharmD, BCPS, BCCCP Clinical Pharmacist Please refer to [facility] for [facility] Pharmacy numbers 07/26/2020 8:50 AM

## 2020-07-26 NOTE — Progress Notes (Signed)
RN called requesting an RT come to the room that Carl Hill's trach was about to come out. When I got to the room Carl Hill's trach was all but out. I was concerned that he was a difficult airway and requested the nurse call CCM. W. Minor NP gave me the verbal order to go ahead and take the trach out. We covered and taped the stoma. RT will continue to monitor as needed.

## 2020-07-27 DIAGNOSIS — N493 Fournier gangrene: Secondary | ICD-10-CM | POA: Diagnosis not present

## 2020-07-27 LAB — PROTIME-INR
INR: 2.3 — ABNORMAL HIGH (ref 0.8–1.2)
Prothrombin Time: 24.5 seconds — ABNORMAL HIGH (ref 11.4–15.2)

## 2020-07-27 LAB — GLUCOSE, CAPILLARY
Glucose-Capillary: 169 mg/dL — ABNORMAL HIGH (ref 70–99)
Glucose-Capillary: 161 mg/dL — ABNORMAL HIGH (ref 70–99)
Glucose-Capillary: 119 mg/dL — ABNORMAL HIGH (ref 70–99)
Glucose-Capillary: 109 mg/dL — ABNORMAL HIGH (ref 70–99)

## 2020-07-27 LAB — BASIC METABOLIC PANEL
Anion gap: 14 (ref 5–15)
BUN: 8 mg/dL (ref 6–20)
CO2: 23 mmol/L (ref 22–32)
Calcium: 8.2 mg/dL — ABNORMAL LOW (ref 8.9–10.3)
Chloride: 97 mmol/L — ABNORMAL LOW (ref 98–111)
Creatinine, Ser: 0.99 mg/dL (ref 0.61–1.24)
GFR, Estimated: >60 mL/min (ref >60)
Glucose, Bld: 183 mg/dL — ABNORMAL HIGH (ref 70–99)
Potassium: 4 mmol/L (ref 3.5–5.1)
Sodium: 134 mmol/L — ABNORMAL LOW (ref 135–145)

## 2020-07-27 MED ORDER — WARFARIN SODIUM 3 MG PO TABS
3.0000 mg | ORAL_TABLET | Freq: Once | ORAL | Status: AC
  Administered 2020-07-27: 3 mg via ORAL
  Filled 2020-07-27: qty 1

## 2020-07-27 NOTE — Progress Notes (Signed)
ANTICOAGULATION CONSULT NOTE  Pharmacy Consult for Warfarin  Indication: atrial fibrillation  Patient Measurements: Height: 5\' 10"  (177.8 cm) Weight: (!) 246.8 kg (544 lb 1.5 oz) IBW/kg (Calculated) : 73 Heparin Dosing Weight: 149.7 kg  Vital Signs: Temp: 98.1 F (36.7 C) (02/26 0434) Temp Source: Oral (02/26 0434) BP: 128/62 (02/26 0434) Pulse Rate: 103 (02/26 0434)  Labs: Recent Labs    07/24/20 0808 07/25/20 0439 07/25/20 0815 07/26/20 0131 07/27/20 0026  HGB 8.5* 9.0*  --   --   --   HCT 27.7* 29.5*  --   --   --   PLT 258 290  --   --   --   LABPROT  --  27.0*  --  26.2* 24.5*  INR  --  2.6*  --  2.5* 2.3*  CREATININE 0.98  --  1.02  --   --     Estimated Creatinine Clearance: 178.5 mL/min (by C-G formula based on SCr of 1.02 mg/dL).   Assessment: 49YOM admitted with septic shock secondary to necrotizing fascitis. Was on Xarelto PTA, but that is not appropriate based on patient's weight. Pharmacy has been consulted to dose warfarin.  Patient sensitive to Warfarin in setting of drug interactions with Amiodarone and Augmentin resulting in Warfarin being held intermittently for high INR. Patient intake 100% with every meal. Warfarin most recently held 2/20 to 2/22 after ~5 mg/day average resulted in elevated INR of 3.9. Warfarin was resumed again on 2/23.   Today, INR is therapeutic and stable at 2.3 after  4mg  on 2/24 and 3mg  on 2/25. Augmentin to continue for 6 months. Also remains on oral Amiodarone. CBC is stable. No overt bleeding reported.   Goal of Therapy:  INR 2-3 Monitor platelets by anticoagulation protocol: Yes  Plan:  Coumadin 3mg  PO x1 Daily INR and weekly CBC Monitor for s/sx of bleeding  3/24, PharmD PGY2 ID Pharmacy Resident Phone between 7 am - 3:30 pm:  Please check AMION for all [facility] Pharmacy phone numbers After 10:00 PM, call Main Pharmacy [phone]  07/27/2020 7:48 AM

## 2020-07-27 NOTE — Progress Notes (Signed)
PROGRESS NOTE    Carl Hill  MBT:[id number] DOB: 10-14-1970 DOA: 06/10/2020 PCP: Malka So., MD   Brief Narrative: 50 year old with past medical history significant for hypertension, obesity, diabetes, A. fib on DOAC, presented with buttock pain, malaise.  CT pelvis showed necrotizing fasciitis in left buttock, left upper thigh and perineum. -Taken to the OR for debridement 1/10, complicated by A. fib with RVR and VT arrest status post CPR defibrillation.  Prolonged mechanical ventilation, eventually trach on 1/26. -Currently on trach collar.  He was transferred to progressive care [facility] to 01/07/2021 -Janina Mayo was downgraded to #6 cuffless on 12/15, per pulmonary no plans for decannulation until has increased mobility or weight loss.  -Remains stable Overall, difficult placement due to morbid obesity, new trach.  Significant Hospital events: 1/10 Admitted to [facility] with sepsis, necrotizing fasciitis. General surgery consulted and debridement pursued. In the afternoon, patient developed hypotension with AF in RVR. Synchronized cardioversion unsuccessful with conversion to VT. CPR initated with ROSC after desynchronized defibrillation. 1/11 Debridement 1/18 Mucus plugging 1/19 Bronchoscopy >> mucus plug on Rt, airway collapse with exhalation 1/20 Persistent fever, change ABx 1/24 Increased vent requirements, habitus related +/- mucus plugging 1/25 Hypotensive to SBP 80s, Levo resumed, minimally responsive overnight off sedation, CT Head negative 1/26 Stable vent/pressor requirements, bronched, tracheostomy 1/29 start hydrotherapy 1/30 start lovenox/coumadin 2/2 Tolerating PS 2/4 did TC for 12 hours 2/5 Did 24 hours TC 2/6 Continue TC  2/7 Try dilaudid with turns/dressing change for better pain control - longer acting, fentanyl IV d/c'd 2/8 Increase standing oxy, dilaudid PRN for turns/dressing changes, downgrade to progressive 2/15 trach changed to # 6 cuffless  2/24 Patient  accidentally self decannulated and decision was made to leave tracheostomy tube out.   Assessment & Plan:   Principal Problem:   Fournier gangrene Active Problems:   Sepsis (HCC)   DKA (diabetic ketoacidosis) (HCC)   AKI (acute kidney injury) (HCC)   Diabetic ulcer of heel (HCC)   Atrial fibrillation, chronic (HCC)   Essential hypertension   Dyslipidemia   Acquired hypothyroidism   Chronic pain disorder   Morbid obesity with BMI of 60.0-69.9, adult (HCC)   Acute respiratory failure (HCC)   Sepsis due to Streptococcus, group B (HCC)   Actinomycosis  1-Acute on Chronic Hypoxic and Hypercapnic Respiratory Failure in the setting of Sepsis, Morbid obesity, healthcare associated pneumonia, recurrent mucous plugging; -Prolonged respiratory failure requiring mechanical ventilation, failed attempt at extubation. -Status post tracheostomy on 1/26, wean off ventilator. -Trach was downgraded to cuffless #6 Shiley on 2/15. -SLP following.  -2/24 Patient accidentally self decannulated and decision was made to leave tracheostomy tube out.    2-Left buttock necrotizing fasciitis with E. coli, group B strep, Peptostreptococcus in wound culture: -Excisional debridement of perianal, gluteal and perineal necrotizing soft tissue infection by Dr. Freida Busman 1/10 -Back to OR 1/11 and excisional debridement of perineal necrosis, by Dr. Freida Busman. -Received hydrotherapy from 06/29/2020 until 07/04/2020. -No further debridement.  General surgery recommended wound care to follow-up. -Seen by infectious disease who recommended Unasyn followed by Augmentin for 5-month course due to actinomyces in the wound.  Patient currently on Augmentin. -Continue with wound care and dressing changes. Has some drainage from wound, will ask wound care to follow on patient.   3-Hypernatremia from diuresis and insensible losses: Resolved  4-Type 2 Diabetes: Currently on regular diet: Continue with Lantus 42 units twice daily,  sliding scale insulin  Acute blood loss anemia from blood loss from wound:  Status  post blood transfusion. Hb  stable  Hypothyroidism: -Continue with Synthroid  A. Fib: -Controlled.  Continue with amiodarone and diltiazem. -He was previously on Xarelto but was transitioned to Coumadin in the ICU, per pharmacy as Xarelto was not felt to be safe for body weight more than 500 pounds. -Continue with Coumadin -HR improved  Pressure injury present on admission -See documentation from wound care nurse -He has rectal tube, for weeks. Discussed with patient risk of having rectal tube, ulceration, lost of sphincter tone. I have place order for nurses to deflect ballon daily for 1-2 hours.  -Continue with  dressing changes.   HLD: Continue with Lipitor Intermittent blurry vision: Could be related to hypotension versus narcotics,  Also happens at home.   Pressure Injury 06/10/20 Heel Right Stage 2 -  Partial thickness loss of dermis presenting as a shallow open injury with a red, pink wound bed without slough. (Active)  06/10/20 1400  Location: Heel  Location Orientation: Right  Staging: Stage 2 -  Partial thickness loss of dermis presenting as a shallow open injury with a red, pink wound bed without slough.  Wound Description (Comments):   Present on Admission: Yes     Nutrition Problem: Increased nutrient needs Etiology: wound healing    Signs/Symptoms: estimated needs    Interventions: Ensure Enlive (each supplement provides 350kcal and 20 grams of protein),MVI,Premier Protein,Magic cup  Estimated body mass index is 78.07 kg/m as calculated from the following:   Height as of this encounter: 5\' 10"  (1.778 m).   Weight as of this encounter: 246.8 kg.   DVT prophylaxis: Warfarin Code Status: Full code Family Communication: Care discussed with patient and mother who was at bedside.  Disposition Plan:  Status is: Inpatient  Remains inpatient appropriate because:Ongoing active  pain requiring inpatient pain management   Dispo: The patient is from: Home              Anticipated d/c is to: SNF              Anticipated d/c date is: 2 days              Patient currently is not medically stable to d/c.   Difficult to place patient No        Consultants:   Surgery  ID  CCM  Procedures:  ETT 1/10 >> R IJ CVL 1/10 >> L brachial A-line 1/10 >> RECTAL TUBE.  S/P TRACH  ON 1/26 6 CUFFLESS ON 2/15   Antimicrobials: Aztreonam 1/09 Metronidazole 1/09 Vancomycin 1/09, 1/11, 1/20 >>1/22 Cefepime 1/10 >> 1/13, 1/20 >>1/22 Clindamycin 1/10 >> 1/13 Ceftriaxone 1/13 >> 1/20 Metronidazole 1/17 >> 1/22 Meropenem 1/22 >> 1/25 Amoxicillin 1/24 (x 1 for PCN Allergy challenge) Unasyn 1/25 >> 1/27 Augmentin 1/27 >>   Subjective: He got skin tear from paper tape today.  He report cramping pain   Objective: Vitals:   07/26/20 1520 07/26/20 2129 07/27/20 0434 07/27/20 0500  BP: 121/66 135/80 128/62   Pulse: (!) 104 (!) 108 (!) 103   Resp: 18 17 18    Temp: 98.4 F (36.9 C) 98.6 F (37 C) 98.1 F (36.7 C)   TempSrc: Oral  Oral   SpO2: 100% 96% 97%   Weight:    (!) 246.8 kg  Height:        Intake/Output Summary (Last 24 hours) at 07/27/2020 1144 Last data filed at 07/27/2020 0730 Gross per 24 hour  Intake 180 ml  Output 1850 ml  Net -1670  ml   Filed Weights   07/25/20 0426 07/26/20 0500 07/27/20 0500  Weight: (!) 246.8 kg (!) 247.6 kg (!) 246.8 kg    Examination:  General exam: Obese Respiratory system: CTA, tracheotomy site cover.  Cardiovascular system: S 1, S 2 RRR Gastrointestinal system: BS present, soft, nt Central nervous system: Alert Extremities: No edema Skin large open wound decubitus.   Data Reviewed: I have personally reviewed following labs and imaging studies  CBC: Recent Labs  Lab 07/21/20 0257 07/23/20 0253 07/24/20 0808 07/25/20 0439  WBC 7.3 7.0 6.9 7.2  HGB 9.0* 9.0* 8.5* 9.0*  HCT 29.1* 28.0* 27.7*  29.5*  MCV 88.2 86.4 88.2 88.1  PLT 243 244 258 290   Basic Metabolic Panel: Recent Labs  Lab 07/21/20 0257 07/23/20 0253 07/24/20 0808 07/25/20 0815 07/27/20 0814  NA 138 135 136 135 134*  K 4.1 3.3* 3.4* 3.7 4.0  CL 92* 92* 94* 94* 97*  CO2 34* 31 30 30 23   GLUCOSE 131* 159* 138* 125* 183*  BUN 8 9 8 8 8   CREATININE 0.93 0.96 0.98 1.02 0.99  CALCIUM 8.8* 8.4* 8.3* 8.4* 8.2*   GFR: Estimated Creatinine Clearance: 183.9 mL/min (by C-G formula based on SCr of 0.99 mg/dL). Liver Function Tests: No results for input(s): AST, ALT, ALKPHOS, BILITOT, PROT, ALBUMIN in the last 168 hours. No results for input(s): LIPASE, AMYLASE in the last 168 hours. No results for input(s): AMMONIA in the last 168 hours. Coagulation Profile: Recent Labs  Lab 07/23/20 0253 07/24/20 0402 07/25/20 0439 07/26/20 0131 07/27/20 0026  INR 3.6* 3.1* 2.6* 2.5* 2.3*   Cardiac Enzymes: No results for input(s): CKTOTAL, CKMB, CKMBINDEX, TROPONINI in the last 168 hours. BNP (last 3 results) No results for input(s): PROBNP in the last 8760 hours. HbA1C: No results for input(s): HGBA1C in the last 72 hours. CBG: Recent Labs  Lab 07/26/20 1055 07/26/20 1628 07/26/20 2128 07/27/20 0803 07/27/20 1137  GLUCAP 136* 164* 154* 169* 161*   Lipid Profile: No results for input(s): CHOL, HDL, LDLCALC, TRIG, CHOLHDL, LDLDIRECT in the last 72 hours. Thyroid Function Tests: No results for input(s): TSH, T4TOTAL, FREET4, T3FREE, THYROIDAB in the last 72 hours. Anemia Panel: No results for input(s): VITAMINB12, FOLATE, FERRITIN, TIBC, IRON, RETICCTPCT in the last 72 hours. Sepsis Labs: No results for input(s): PROCALCITON, LATICACIDVEN in the last 168 hours.  No results found for this or any previous visit (from the past 240 hour(s)).       Radiology Studies: No results found.      Scheduled Meds: . amiodarone  200 mg Oral Daily  . amoxicillin-clavulanate  1 tablet Oral Q8H  . atorvastatin   20 mg Oral QPM  . chlorhexidine  15 mL Mouth Rinse BID  . Chlorhexidine Gluconate Cloth  6 each Topical Daily  . collagenase  1 application Topical Daily  . diltiazem  240 mg Oral Daily  . feeding supplement  237 mL Oral BID BM  . insulin aspart  0-20 Units Subcutaneous TID WC  . insulin aspart  0-5 Units Subcutaneous QHS  . insulin aspart  18 Units Subcutaneous TID WC  . insulin detemir  42 Units Subcutaneous BID  . levothyroxine  75 mcg Oral Daily  . mouth rinse  15 mL Mouth Rinse q12n4p  . multivitamin with minerals  1 tablet Oral Daily  . nortriptyline  50 mg Oral BID  . oxyCODONE  15 mg Oral Q12H  . pantoprazole  40 mg Oral Daily  .  pregabalin  300 mg Oral BID  . Ensure Max Protein  11 oz Oral QHS  . senna-docusate  2 tablet Oral Daily  . sodium chloride flush  3 mL Intravenous Q12H  . warfarin  3 mg Oral ONCE-1600  . Warfarin - Pharmacist Dosing Inpatient   Does not apply q1600   Continuous Infusions: . sodium chloride Stopped (06/28/20 2035)     LOS: 47 days    Time spent: 35 minutes.     Alba Cory, MD [facility]   If 7PM-7AM, please contact night-coverage [email or web address]  07/27/2020, 11:44 AM

## 2020-07-28 DIAGNOSIS — N493 Fournier gangrene: Secondary | ICD-10-CM | POA: Diagnosis not present

## 2020-07-28 LAB — GLUCOSE, CAPILLARY
Glucose-Capillary: 144 mg/dL — ABNORMAL HIGH (ref 70–99)
Glucose-Capillary: 130 mg/dL — ABNORMAL HIGH (ref 70–99)
Glucose-Capillary: 155 mg/dL — ABNORMAL HIGH (ref 70–99)
Glucose-Capillary: 131 mg/dL — ABNORMAL HIGH (ref 70–99)

## 2020-07-28 LAB — PROTIME-INR
INR: 2.4 — ABNORMAL HIGH (ref 0.8–1.2)
Prothrombin Time: 25.6 seconds — ABNORMAL HIGH (ref 11.4–15.2)

## 2020-07-28 MED ORDER — WARFARIN SODIUM 3 MG PO TABS
3.0000 mg | ORAL_TABLET | Freq: Once | ORAL | Status: AC
  Administered 2020-07-28: 3 mg via ORAL
  Filled 2020-07-28: qty 1

## 2020-07-28 NOTE — Progress Notes (Signed)
PROGRESS NOTE    EMMANUELLE COXE  SWF:[id number] DOB: March 24, 1971 DOA: 06/10/2020 PCP: Malka So., MD   Brief Narrative: 50 year old with past medical history significant for hypertension, obesity, diabetes, A. fib on DOAC, presented with buttock pain, malaise.  CT pelvis showed necrotizing fasciitis in left buttock, left upper thigh and perineum. -Taken to the OR for debridement 1/10, complicated by A. fib with RVR and VT arrest status post CPR defibrillation.  Prolonged mechanical ventilation, eventually trach on 1/26. -Currently on trach collar.  He was transferred to progressive care TRH to 01/07/2021 -Janina Mayo was downgraded to #6 cuffless on 12/15, per pulmonary no plans for decannulation until has increased mobility or weight loss.  -Remains stable Overall, difficult placement due to morbid obesity, Now self decannulated.   Significant Hospital events: 1/10 Admitted to [facility] with sepsis, necrotizing fasciitis. General surgery consulted and debridement pursued. In the afternoon, patient developed hypotension with AF in RVR. Synchronized cardioversion unsuccessful with conversion to VT. CPR initated with ROSC after desynchronized defibrillation. 1/11 Debridement 1/18 Mucus plugging 1/19 Bronchoscopy >> mucus plug on Rt, airway collapse with exhalation 1/20 Persistent fever, change ABx 1/24 Increased vent requirements, habitus related +/- mucus plugging 1/25 Hypotensive to SBP 80s, Levo resumed, minimally responsive overnight off sedation, CT Head negative 1/26 Stable vent/pressor requirements, bronched, tracheostomy 1/29 start hydrotherapy 1/30 start lovenox/coumadin 2/2 Tolerating PS 2/4 did TC for 12 hours 2/5 Did 24 hours TC 2/6 Continue TC  2/7 Try dilaudid with turns/dressing change for better pain control - longer acting, fentanyl IV d/c'd 2/8 Increase standing oxy, dilaudid PRN for turns/dressing changes, downgrade to progressive 2/15 trach changed to # 6 cuffless  2/24  Patient accidentally self decannulated and decision was made to leave tracheostomy tube out.   Assessment & Plan:   Principal Problem:   Fournier gangrene Active Problems:   Sepsis (HCC)   DKA (diabetic ketoacidosis) (HCC)   AKI (acute kidney injury) (HCC)   Diabetic ulcer of heel (HCC)   Atrial fibrillation, chronic (HCC)   Essential hypertension   Dyslipidemia   Acquired hypothyroidism   Chronic pain disorder   Morbid obesity with BMI of 60.0-69.9, adult (HCC)   Acute respiratory failure (HCC)   Sepsis due to Streptococcus, group B (HCC)   Actinomycosis  1-Acute on Chronic Hypoxic and Hypercapnic Respiratory Failure in the setting of Sepsis, Morbid obesity, healthcare associated pneumonia, recurrent mucous plugging; -Prolonged respiratory failure requiring mechanical ventilation, failed attempt at extubation. -Status post tracheostomy on 1/26, wean off ventilator. -Trach was downgraded to cuffless #6 Shiley on 2/15. -SLP following.  -2/24 Patient accidentally self decannulated and decision was made to leave tracheostomy tube out.  -he has remain stable over weekend.   2-Left buttock necrotizing fasciitis with E. coli, group B strep, Peptostreptococcus in wound culture: -Excisional debridement of perianal, gluteal and perineal necrotizing soft tissue infection by Dr. Freida Busman 1/10 -Back to OR 1/11 and excisional debridement of perineal necrosis, by Dr. Freida Busman. -Received hydrotherapy from 06/29/2020 until 07/04/2020. -No further debridement.  General surgery recommended wound care to follow-up. -Seen by infectious disease who recommended Unasyn followed by Augmentin for 66-month course due to actinomyces in the wound.  Patient currently on Augmentin. -Continue with wound care and dressing changes. -Has some drainage from wound, will ask wound care to follow on patient. Will check CBC tomorrow.   3-Hypernatremia from diuresis and insensible losses: Resolved  4-Type 2  Diabetes: Currently on regular diet: Continue with Lantus 42 units twice daily, sliding scale insulin  Acute blood loss anemia from blood loss from wound:  Status post blood transfusion. Hb  Stable.  Hypothyroidism: -Continue with Synthroid  A. Fib: -Controlled.  Continue with amiodarone and diltiazem. -He was previously on Xarelto but was transitioned to Coumadin in the ICU, per pharmacy as Xarelto was not felt to be safe for body weight more than 500 pounds. -Continue with Coumadin -HR improved  Pressure injury present on admission -See documentation from wound care nurse -He has rectal tube, for weeks. Discussed with patient risk of having rectal tube, ulceration, lost of sphincter tone. I have place order for nurses to deflect ballon daily for 1-2 hours.  -Continue with  dressing changes.   HLD: Continue with Lipitor Intermittent blurry vision: Could be related to hypotension versus narcotics,  Also happens at home.   Pressure Injury 06/10/20 Heel Right Stage 2 -  Partial thickness loss of dermis presenting as a shallow open injury with a red, pink wound bed without slough. (Active)  06/10/20 1400  Location: Heel  Location Orientation: Right  Staging: Stage 2 -  Partial thickness loss of dermis presenting as a shallow open injury with a red, pink wound bed without slough.  Wound Description (Comments):   Present on Admission: Yes     Nutrition Problem: Increased nutrient needs Etiology: wound healing    Signs/Symptoms: estimated needs    Interventions: Ensure Enlive (each supplement provides 350kcal and 20 grams of protein),MVI,Premier Protein,Magic cup  Estimated body mass index is 78.07 kg/m as calculated from the following:   Height as of this encounter: 5\' 10"  (1.778 m).   Weight as of this encounter: 246.8 kg.   DVT prophylaxis: Warfarin Code Status: Full code Family Communication: Care discussed with patient and mother who was at bedside 2/26.   Disposition Plan:  Status is: Inpatient  Remains inpatient appropriate because:Ongoing active pain requiring inpatient pain management   Dispo: The patient is from: Home              Anticipated d/c is to: SNF              Anticipated d/c date is: 2 days              Patient currently is not medically stable to d/c.   Difficult to place patient No        Consultants:   Surgery  ID  CCM  Procedures:  ETT 1/10 >> R IJ CVL 1/10 >> L brachial A-line 1/10 >> RECTAL TUBE.  S/P TRACH  ON 1/26 6 CUFFLESS ON 2/15   Antimicrobials: Aztreonam 1/09 Metronidazole 1/09 Vancomycin 1/09, 1/11, 1/20 >>1/22 Cefepime 1/10 >> 1/13, 1/20 >>1/22 Clindamycin 1/10 >> 1/13 Ceftriaxone 1/13 >> 1/20 Metronidazole 1/17 >> 1/22 Meropenem 1/22 >> 1/25 Amoxicillin 1/24 (x 1 for PCN Allergy challenge) Unasyn 1/25 >> 1/27 Augmentin 1/27 >>   Subjective: He denies abdominal pain. Rectal pain controlled.    Objective: Vitals:   07/27/20 1500 07/27/20 2133 07/28/20 0305 07/28/20 0500  BP: 134/80 (!) 136/116 (!) 102/53   Pulse: (!) 102 (!) 107 (!) 102   Resp: 16 19 18    Temp: 98.2 F (36.8 C) 98 F (36.7 C) 98.2 F (36.8 C)   TempSrc: Oral Oral Oral   SpO2: 99% 100% 95%   Weight:    (!) 246.8 kg  Height:        Intake/Output Summary (Last 24 hours) at 07/28/2020 1025 Last data filed at 07/28/2020 0930 Gross per 24 hour  Intake  1833 ml  Output 7250 ml  Net -5417 ml   Filed Weights   07/26/20 0500 07/27/20 0500 07/28/20 0500  Weight: (!) 247.6 kg (!) 246.8 kg (!) 246.8 kg    Examination:  General exam: Obese Respiratory system; CTA, tracheostomy site cover.  Cardiovascular system: S 1, S 2 RRR Gastrointestinal system: BS present, soft, nt Central nervous system: Alert Extremities: No edema Skin large open wound  Buttocks and legs  Data Reviewed: I have personally reviewed following labs and imaging studies  CBC: Recent Labs  Lab 07/23/20 0253  07/24/20 0808 07/25/20 0439  WBC 7.0 6.9 7.2  HGB 9.0* 8.5* 9.0*  HCT 28.0* 27.7* 29.5*  MCV 86.4 88.2 88.1  PLT 244 258 290   Basic Metabolic Panel: Recent Labs  Lab 07/23/20 0253 07/24/20 0808 07/25/20 0815 07/27/20 0814  NA 135 136 135 134*  K 3.3* 3.4* 3.7 4.0  CL 92* 94* 94* 97*  CO2 31 30 30 23   GLUCOSE 159* 138* 125* 183*  BUN 9 8 8 8   CREATININE 0.96 0.98 1.02 0.99  CALCIUM 8.4* 8.3* 8.4* 8.2*   GFR: Estimated Creatinine Clearance: 183.9 mL/min (by C-G formula based on SCr of 0.99 mg/dL). Liver Function Tests: No results for input(s): AST, ALT, ALKPHOS, BILITOT, PROT, ALBUMIN in the last 168 hours. No results for input(s): LIPASE, AMYLASE in the last 168 hours. No results for input(s): AMMONIA in the last 168 hours. Coagulation Profile: Recent Labs  Lab 07/24/20 0402 07/25/20 0439 07/26/20 0131 07/27/20 0026 07/28/20 0247  INR 3.1* 2.6* 2.5* 2.3* 2.4*   Cardiac Enzymes: No results for input(s): CKTOTAL, CKMB, CKMBINDEX, TROPONINI in the last 168 hours. BNP (last 3 results) No results for input(s): PROBNP in the last 8760 hours. HbA1C: No results for input(s): HGBA1C in the last 72 hours. CBG: Recent Labs  Lab 07/27/20 0803 07/27/20 1137 07/27/20 1653 07/27/20 2117 07/28/20 0650  GLUCAP 169* 161* 119* 109* 144*   Lipid Profile: No results for input(s): CHOL, HDL, LDLCALC, TRIG, CHOLHDL, LDLDIRECT in the last 72 hours. Thyroid Function Tests: No results for input(s): TSH, T4TOTAL, FREET4, T3FREE, THYROIDAB in the last 72 hours. Anemia Panel: No results for input(s): VITAMINB12, FOLATE, FERRITIN, TIBC, IRON, RETICCTPCT in the last 72 hours. Sepsis Labs: No results for input(s): PROCALCITON, LATICACIDVEN in the last 168 hours.  No results found for this or any previous visit (from the past 240 hour(s)).       Radiology Studies: No results found.      Scheduled Meds: . amiodarone  200 mg Oral Daily  . amoxicillin-clavulanate  1  tablet Oral Q8H  . atorvastatin  20 mg Oral QPM  . chlorhexidine  15 mL Mouth Rinse BID  . Chlorhexidine Gluconate Cloth  6 each Topical Daily  . collagenase  1 application Topical Daily  . diltiazem  240 mg Oral Daily  . feeding supplement  237 mL Oral BID BM  . insulin aspart  0-20 Units Subcutaneous TID WC  . insulin aspart  0-5 Units Subcutaneous QHS  . insulin aspart  18 Units Subcutaneous TID WC  . insulin detemir  42 Units Subcutaneous BID  . levothyroxine  75 mcg Oral Daily  . mouth rinse  15 mL Mouth Rinse q12n4p  . multivitamin with minerals  1 tablet Oral Daily  . nortriptyline  50 mg Oral BID  . oxyCODONE  15 mg Oral Q12H  . pantoprazole  40 mg Oral Daily  . pregabalin  300 mg Oral BID  .  Ensure Max Protein  11 oz Oral QHS  . senna-docusate  2 tablet Oral Daily  . sodium chloride flush  3 mL Intravenous Q12H  . warfarin  3 mg Oral ONCE-1600  . Warfarin - Pharmacist Dosing Inpatient   Does not apply q1600   Continuous Infusions: . sodium chloride Stopped (06/28/20 2035)     LOS: 48 days    Time spent: 35 minutes.     Alba Cory, MD [facility]   If 7PM-7AM, please contact night-coverage [email or web address]  07/28/2020, 10:25 AM

## 2020-07-28 NOTE — Consult Note (Signed)
WOC Nurse Consult Note: Reason for Consult: Requested by hospitalist Dr. Lonia Farber for family report of increased drainage from buttock/perineal wound. CCS PA wrote to "defer to wound care" on 07/08/2020.  Wound was then and remains clean with no need for debridement or hydrotherapy. Last seen by my associate S. Doty on 07/15/20 for consultation on heels. Bilateral heels are dry with callus.  Evidence of right heel wound (healing full thickness) contracting and drying is noted.  Will continue the POC for that area (betadine swabstick application). The assistance of the patient's bedside RN for the turning and repositioning and replacing the dressing that was removed is appreciated. Wound type: infectious Pressure Injury POA: N/A Measurement: Buttock wound measures: 27cm x 17cm with 90% of wound depth measuring 0.4cm. Deeper, oval, punctate area noted at proximal left wound bed measuring 1.5cm deep.  Wound bed: Wound is 90% red, very moist with yellow exudate pooling in punctate area and scattered over wound bed. This is not adherent slough, but rather pooled exudate. Drainage (amount, consistency, odor) See above. Moderate amount yellow exudate. Periwound:intact with evidence of wound healing. Dressing procedure/placement/frequency: Patient is experiencing a rectal fullness sensation and RN is going to remove FlexiSeal bowel management device so that patient can bear down and attempt to pass what he describes as a "stool ball". She will then reinsert the bowel management device. As wound bed is clean, I will discontinue the collagenase to this area, but continue the twice daily and PRN soiling or drainage strike-through saline dressings using Kerlix roll gauze, ABD and securement with Medipore tape.   Patient is on a Bariatric Specialty Kreg sleep surface.   WOC nursing team will not follow, but will remain available to this patient, the nursing and medical teams.  Please re-consult if  needed. Thanks, Ladona Mow, MSN, RN, GNP, [facility]  Pager# [phone]

## 2020-07-28 NOTE — Plan of Care (Signed)
  Problem: Education: Goal: Knowledge of General Education information will improve Description: Including pain rating scale, medication(s)/side effects and non-pharmacologic comfort measures Outcome: Progressing   Problem: Health Behavior/Discharge Planning: Goal: Ability to manage health-related needs will improve Outcome: Progressing   Problem: Clinical Measurements: Goal: Respiratory complications will improve Outcome: Progressing Goal: Cardiovascular complication will be avoided Outcome: Progressing   Problem: Activity: Goal: Risk for activity intolerance will decrease Outcome: Progressing   Problem: Nutrition: Goal: Adequate nutrition will be maintained Outcome: Progressing   Problem: Elimination: Goal: Will not experience complications related to bowel motility Outcome: Progressing Goal: Will not experience complications related to urinary retention Outcome: Progressing   Problem: Pain Managment: Goal: General experience of comfort will improve Outcome: Progressing   Problem: Safety: Goal: Ability to remain free from injury will improve Outcome: Progressing   Problem: Skin Integrity: Goal: Risk for impaired skin integrity will decrease Outcome: Progressing   

## 2020-07-28 NOTE — Plan of Care (Signed)
  Problem: Education: Goal: Knowledge of General Education information will improve Description Including pain rating scale, medication(s)/side effects and non-pharmacologic comfort measures Outcome: Progressing   

## 2020-07-28 NOTE — Progress Notes (Signed)
ANTICOAGULATION CONSULT NOTE  Pharmacy Consult for Warfarin  Indication: atrial fibrillation  Patient Measurements: Height: 5\' 10"  (177.8 cm) Weight: (!) 246.8 kg (544 lb 1.5 oz) IBW/kg (Calculated) : 73 Heparin Dosing Weight: 149.7 kg  Vital Signs: Temp: 98.2 F (36.8 C) (02/27 0305) Temp Source: Oral (02/27 0305) BP: 102/53 (02/27 0305) Pulse Rate: 102 (02/27 0305)  Labs: Recent Labs    07/26/20 0131 07/27/20 0026 07/27/20 0814 07/28/20 0247  LABPROT 26.2* 24.5*  --  25.6*  INR 2.5* 2.3*  --  2.4*  CREATININE  --   --  0.99  --     Estimated Creatinine Clearance: 183.9 mL/min (by C-G formula based on SCr of 0.99 mg/dL).   Assessment: 49YOM admitted with septic shock secondary to necrotizing fascitis. Was on Xarelto PTA, but that is not appropriate based on patient's weight. Pharmacy has been consulted to dose warfarin.  Patient sensitive to Warfarin in setting of drug interactions with Amiodarone and Augmentin resulting in Warfarin being held intermittently for high INR. Patient intake 100% with every meal. Warfarin most recently held 2/20 to 2/22 after ~5 mg/day average resulted in elevated INR of 3.9. Warfarin was resumed again on 2/23.   Today, INR is therapeutic and stable at 2.4 after 3mg  on both 2/25 and 2/26. Augmentin to continue for 6 months. Also remains on oral Amiodarone. CBC is stable. No overt bleeding reported.   Goal of Therapy:  INR 2-3 Monitor platelets by anticoagulation protocol: Yes  Plan:  Coumadin 3mg  PO x1 Daily INR and weekly CBC Monitor for s/sx of bleeding  3/25, PharmD PGY2 ID Pharmacy Resident Phone between 7 am - 3:30 pm: 3/26  Please check AMION for all [facility] Pharmacy phone numbers After 10:00 PM, call Main Pharmacy [phone]  07/28/2020 8:31 AM

## 2020-07-29 DIAGNOSIS — N493 Fournier gangrene: Secondary | ICD-10-CM | POA: Diagnosis not present

## 2020-07-29 LAB — BASIC METABOLIC PANEL
Anion gap: 13 (ref 5–15)
BUN: 10 mg/dL (ref 6–20)
CO2: 26 mmol/L (ref 22–32)
Calcium: 8.3 mg/dL — ABNORMAL LOW (ref 8.9–10.3)
Chloride: 95 mmol/L — ABNORMAL LOW (ref 98–111)
Creatinine, Ser: 0.85 mg/dL (ref 0.61–1.24)
GFR, Estimated: >60 mL/min (ref >60)
Glucose, Bld: 141 mg/dL — ABNORMAL HIGH (ref 70–99)
Potassium: 3.4 mmol/L — ABNORMAL LOW (ref 3.5–5.1)
Sodium: 134 mmol/L — ABNORMAL LOW (ref 135–145)

## 2020-07-29 LAB — CBC
HCT: 28.3 % — ABNORMAL LOW (ref 39.0–52.0)
Hemoglobin: 8.9 g/dL — ABNORMAL LOW (ref 13.0–17.0)
MCH: 27.3 pg (ref 26.0–34.0)
MCHC: 31.4 g/dL (ref 30.0–36.0)
MCV: 86.8 fL (ref 80.0–100.0)
Platelets: 309 10*3/uL (ref 150–400)
RBC: 3.26 MIL/uL — ABNORMAL LOW (ref 4.22–5.81)
RDW: 16.9 % — ABNORMAL HIGH (ref 11.5–15.5)
WBC: 6.2 10*3/uL (ref 4.0–10.5)
nRBC: 0.3 % — ABNORMAL HIGH (ref 0.0–0.2)

## 2020-07-29 LAB — GLUCOSE, CAPILLARY
Glucose-Capillary: 144 mg/dL — ABNORMAL HIGH (ref 70–99)
Glucose-Capillary: 203 mg/dL — ABNORMAL HIGH (ref 70–99)
Glucose-Capillary: 166 mg/dL — ABNORMAL HIGH (ref 70–99)
Glucose-Capillary: 103 mg/dL — ABNORMAL HIGH (ref 70–99)

## 2020-07-29 LAB — PROTIME-INR
INR: 2.6 — ABNORMAL HIGH (ref 0.8–1.2)
Prothrombin Time: 27.3 seconds — ABNORMAL HIGH (ref 11.4–15.2)

## 2020-07-29 MED ORDER — POTASSIUM CHLORIDE CRYS ER 20 MEQ PO TBCR
40.0000 meq | EXTENDED_RELEASE_TABLET | Freq: Once | ORAL | Status: AC
  Administered 2020-07-29: 40 meq via ORAL
  Filled 2020-07-29: qty 2

## 2020-07-29 MED ORDER — WARFARIN SODIUM 3 MG PO TABS
3.0000 mg | ORAL_TABLET | Freq: Every day | ORAL | Status: DC
  Administered 2020-07-29 – 2020-07-31 (×3): 3 mg via ORAL
  Filled 2020-07-29 (×3): qty 1

## 2020-07-29 NOTE — Progress Notes (Signed)
Inpatient Rehab Admissions Coordinator:   Per PT request, rescreened for candidacy.  Pt had an excellent therapy session today and is slowly improving as his orthostasis improves.  Will follow 1 more therapy session and if he continues to show tolerance for therapy outside of tilt function would recommend CIR consult.   Estill Dooms, PT, DPT Admissions Coordinator [phone] 07/29/20  3:04 PM

## 2020-07-29 NOTE — TOC Progression Note (Signed)
Transition of Care (TOC) - Progression Note    Patient Details  Name: Carl Hill MRN: [id number] Date of Birth: 12/09/1970  Transition of Care (TOC) CM/SW Contact  Michelle R Stubbldfield, RN Phone Number: 07/29/2020, 10:16 AM  Clinical Narrative:    Case management met with the patient and patient's mother at the bedside regarding transitions of care to CIR, once the patient is able to tolerate PT sessions sitting on side of bed >60 minutes for therapy.  The patient is currently on 2 L/min nasal cannula at this time and continues to need bariatric specialty bed, rectal pouch for stools and external catheter to allow for wound healing in buttocks/ perineal region.  As patient progresses, CM and MSW will continue to follow the patient for possible admission to CIR, as recommended by PT.     Expected Discharge Plan: IP Rehab Facility (CIR versus SNF placement) Barriers to Discharge: Continued Medical Work up,SNF Pending bed offer (Bariatric bed need - weight 545 lbs)  Expected Discharge Plan and Services Expected Discharge Plan: IP Rehab Facility (CIR versus SNF placement) In-house Referral: Clinical Social Work,Nutrition Discharge Planning Services: CM Consult Post Acute Care Choice: IP Rehab,Skilled Nursing Facility Living arrangements for the past 2 months: Single Family Home                                       Social Determinants of Health (SDOH) Interventions    Readmission Risk Interventions Readmission Risk Prevention Plan 07/26/2020  Transportation Screening Complete  PCP or Specialist Appt within 3-5 Days Complete  HRI or Home Care Consult Complete  Social Work Consult for Recovery Care Planning/Counseling Complete  Palliative Care Screening Complete  Medication Review (RN Care Manager) Complete  Some recent data might be hidden   

## 2020-07-29 NOTE — Progress Notes (Signed)
Physical Therapy Treatment Patient Details Name: Carl Hill MRN: [id number] DOB: 02-May-1971 Today's Date: 07/29/2020    History of Present Illness Pt is a 51 year old male who presented with buttock pain, fatigue and malaise.  Found to have hyperglycemia. CT pelvis demonstrated necrotizing fasciitis in L buttock, L upper thigh, and perineum. Taken to OR for debridement 1/10 c/b Afib with RVR and VT arrest s/p CPR/defib. Trached 1/26 PMHx: HTN, obesity, DM, AFib.  As of 07/04/20 started TC trials.  Accidental self decannulated 07/26/20 and left out.    PT Comments    Pt worked well with therapy today, able to sit EOB for the first time in 49 days (since admission).  He sat for ~10 mins working towards attempts at standing with the bariatric RW and two person assist.  He was close, but could not come all the way off of his bottom.  Next session, I plan to elevate the HOB more and possibly take the RW out of the equation (his hips are too wide to fit both feet inside of the bari RW). Kreg bed rep, Janyth Pupa present and assisting with bed and pt today.  VSS on RA throughout session.  I asked pt to be his own advocate and call to pre-medicate himself for his daily 8:30 am therapy.    Follow Up Recommendations  CIR     Equipment Recommendations  Wheelchair (measurements PT);Wheelchair cushion (measurements PT);Hospital bed;Other (comment);3in1 (PT) (all bariatric equipment)    Recommendations for Other Services Rehab consult     Precautions / Restrictions Precautions Precautions: Fall Precaution Comments: rectal tube, foley catheter, large buttocks wound, hydro on hold    Mobility  Bed Mobility Overal bed mobility: Needs Assistance Bed Mobility: Rolling;Supine to Sit;Sit to Supine Rolling: Supervision   Supine to sit: Mod assist;+2 for physical assistance;HOB elevated Sit to supine: Mod assist;+2 for physical assistance;HOB elevated   General bed mobility comments: supervision for  safety, line management, mod assist to come to sitting EOB, primarily one person providing HHA to pull up, other blocking the EOB to ensure safety and make sure he is not too close to EOB. Two person assist mostly one at each leg to return to supine.    Transfers Overall transfer level: Needs assistance Equipment used: Rolling walker (2 wheeled)             General transfer comment: Attempted sit to stand with bari RW x 3 two person assist.  One blocking one knee, one stabilizing RW during attempts.  Pt using one hand on RW and one hand on foot board of bed.  Pt lifted, shfited weight forward, but was unable to get off of his bottom today.  He was a bit discouraged, but I reminded him he has not stood up (or even sat up like this) in 49 days.  I did not expect him to hop up on his first try.  May attempt to elevate the bed more next session to see if the extra height helps him come up.  Ambulation/Gait                 Stairs             Wheelchair Mobility    Modified Rankin (Stroke Patients Only)       Balance Overall balance assessment: Needs assistance Sitting-balance support: Feet supported;Bilateral upper extremity supported Sitting balance-Leahy Scale: Good Sitting balance - Comments: Despite using bil hands, I believe pt could sit without UE support.  He was unweighting his sore buttocks with hands on bed rails.   Standing balance support: Bilateral upper extremity supported Standing balance-Leahy Scale: Zero Standing balance comment: unable to come to standing with multiple attempts. would like to try from higher bed next session.                            Cognition Arousal/Alertness: Awake/alert Behavior During Therapy: WFL for tasks assessed/performed Overall Cognitive Status: Within Functional Limits for tasks assessed                                        Exercises General Exercises - Upper Extremity Shoulder  Horizontal ABduction: AROM;Both;20 reps;Theraband Elbow Flexion: Strengthening;Both;20 reps;Standing General Exercises - Lower Extremity Quad Sets: AROM;Both;20 reps;Supine Long Arc Quad: AROM;Both;5 reps;Seated (to test strength in sitting, no really for exercise) Heel Slides: AROM;Both;20 reps;Supine Straight Leg Raises: AROM;Both;20 reps;Supine    General Comments General comments (skin integrity, edema, etc.): BPs and symptoms stable EOB.  HR into the 120s from 110s at rest, BPs 140s dropped to 120s, but no symptoms of lightheadedness today.  Mostly painful to sit on bottom for ~10 mins at EOB.  Attempting to get pt to be his own "pre-medication" advocate and call to pre medicate himself for his daily 8:30 am therapy sessions.      Pertinent Vitals/Pain Pain Assessment: Faces Faces Pain Scale: Hurts whole lot Pain Location: buttocks and back spasms with sitting EOB Pain Descriptors / Indicators: Grimacing;Guarding Pain Intervention(s): Limited activity within patient's tolerance;Monitored during session;Repositioned;Premedicated before session;RN gave pain meds during session    Home Living                      Prior Function            PT Goals (current goals can now be found in the care plan section) Acute Rehab PT Goals Patient Stated Goal: to get stronger, try sitting up EOB, get back to walking with RW PT Goal Formulation: With patient Time For Goal Achievement: 08/12/20 Potential to Achieve Goals: Good Progress towards PT goals: Progressing toward goals    Frequency    Min 3X/week      PT Plan Current plan remains appropriate    Co-evaluation              AM-PAC PT "6 Clicks" Mobility   Outcome Measure  Help needed turning from your back to your side while in a flat bed without using bedrails?: A Little Help needed moving from lying on your back to sitting on the side of a flat bed without using bedrails?: A Lot Help needed moving to and from  a bed to a chair (including a wheelchair)?: Total Help needed standing up from a chair using your arms (e.g., wheelchair or bedside chair)?: Total Help needed to walk in hospital room?: Total Help needed climbing 3-5 steps with a railing? : Total 6 Click Score: 9    End of Session Equipment Utilized During Treatment: Other (comment) (took oxygen off (he was on 1L)) Activity Tolerance: Patient limited by pain Patient left: in bed;with call bell/phone within reach;with family/visitor present Nurse Communication: Patient requests pain meds PT Visit Diagnosis: Muscle weakness (generalized) (M62.81);Other abnormalities of gait and mobility (R26.89)     Time: 8527-7824 PT Time Calculation (min) (ACUTE ONLY): 67 min  Charges:  $Therapeutic Exercise: 8-22 mins $Therapeutic Activity: 23-37 mins                     Corinna Capra, PT, DPT  Acute Rehabilitation [phone] pager [phone]) [phone] office

## 2020-07-29 NOTE — Progress Notes (Signed)
[facility] PROGRESS NOTE  Carl BossJonathan D Hill WUJ:[id number] DOB: 1971/05/18 DOA: 06/10/2020 PCP: Malka SoJobe, Daniel B., MD  Status: Remains inpatient appropriate because:Altered mental status, Unsafe d/c plan and Inpatient level of care appropriate due to severity of illness   Dispo:               The patient is from: Home              Anticipated d/c is to: SNF vs CIR (patient needs to be able to tolerate a partially standing/sitting position for at least 60 minutes before can be reconsidered for inpatient rehabilitation)              Patient currently is not medically stable to d/c.  Patient continues to have intermittent orthostatic hypotension with efforts to stand utilizing specialty bed.  In addition patient has a very large deep combine decubitus involving the buttocks, sacrum, coccyx as well as wounds on both lower extremities.  These wounds occurred in the context of necrotizing soft tissue injury/fasciitis.  Patient is morbidly obese greater than 500 pounds and requires multiple providers for repositioning and providing wound care twice daily.              Barriers to DC: Morbid obesity, need for aggressive wound care, continued issues with intermittent orthostatic hypotension with activity and physical therapy.  Is very motivated to improve and return to the home environment   Difficult to place patient Yes   Level of care: Telemetry Medical  Code Status: Full Family Communication:  DVT prophylaxis: Warfarin Vaccination status: Fully vaccinated as of June 2021/Moderna; discussed with patient and on 2/25 Moderna booster shot given  Foley catheter: 16 Fr Foley placed 07/4399  HPI: 50 year old male with history of morbid obesity, hypertension, diabetes with peripheral lower extremity neuropathy, atrial fibrillation.  He initially presented to the ER on 1/10 with reports of hyperglycemia with glucose greater than 406.  He was symptomatic with dizziness lightheadedness and headache.  He  reports chronic lower extremity wounds on the left heel the recent left calf skin tear.  In route to the hospital he sustained a skin tear of his right calf.  His mother had also noticed a boil on his buttocks on Thursday.  Patient at baseline is ambulatory with a cane but due to current illness mother has been assisting with his care including pericare.  On exam in the ER patient was found to have what appeared to be significant necrotizing soft tissue infection/injury to the buttocks, perineum and upper thigh regions.  Surgery was urgently consulted and patient was taken to the OR.  See below regarding other significant events.  Since that time patient has transitioned out of the ICU initially to the progressive care unit and as of the past 24 hours to 6 N. surgical unit.  Patient had been stable on a trach collar and accidentally decannulated himself this morning.  PCCM was notified and recommended leaving the trach out and patient has done well since that time.  Patient had been undergoing aggressive wound care to include hydrotherapy with significant improvement in the wound base and resolution of necrotic and other debris.  He is now tolerating saline packing wound dressings to all of his wounds twice daily.  He continues to have significant pain in the decubitus area as well as worsening neuropathy pain in both feet as PT has worked with him and he is starting to do some partial weightbearing.  Patient very talkative and animated today  on 2/25 and is very eager to continue therapy and eventually return home.  Significant Hospital events: 1/10 Admitted to [facility] with sepsis, necrotizing fasciitis. General surgery consulted and debridement pursued. In the afternoon, patient developed hypotension with AF in RVR. Synchronized cardioversion unsuccessful with conversion to VT. CPR initated with ROSC after desynchronized defibrillation. 1/11 Debridement 1/18 Mucus plugging 1/19 Bronchoscopy >> mucus plug on  Rt, airway collapse with exhalation 1/20 Persistent fever, change ABx 1/24 Increased vent requirements, habitus related +/- mucus plugging 1/25 Hypotensive to SBP 80s, Levo resumed, minimally responsive overnight off sedation, CT Head negative 1/26 Stable vent/pressor requirements, bronched, tracheostomy 1/29 start hydrotherapy 1/30 start lovenox/coumadin 2/2 Tolerating PS 2/4 did TC for 12 hours 2/5 Did 24 hours TC 2/6 Continue TC  2/8 downgrade to progressive 2/15 trach changed to # 6 cuffless  2/25 Self decannulated-trach left out   Subjective: Awake and alert.  Discussed with patient plan to remain inpatient to assure stability of vital signs during repositioning as well as to continue aggressive wound care that could not be delivered in a skilled nursing facility setting.  Patient and mother agreed.  Aware that my intended plan is to have patient reevaluated by CIR since ultimate goal is to discharge back to home with hopeful previous level of functioning.  Reported improvement in pain after the addition of OxyContin.  Objective: Vitals:   07/28/20 2150 07/29/20 0629  BP: 121/84 (!) 149/82  Pulse: 98 94  Resp: 18   Temp: 98.3 F (36.8 C) 98.2 F (36.8 C)  SpO2: 98% 98%    Intake/Output Summary (Last 24 hours) at 07/29/2020 0816 Last data filed at 07/29/2020 1610 Gross per 24 hour  Intake 183 ml  Output 2750 ml  Net -2567 ml   Filed Weights   07/26/20 0500 07/27/20 0500 07/28/20 0500  Weight: (!) 247.6 kg (!) 246.8 kg (!) 246.8 kg    Exam:  Constitutional: Awake, calm, no acute distress Neck: Obese, dry dressing is over recent trach site with no further audible wheezing Respiratory: Lungs clear, no increased work of breathing, stable on 1 to 2 L nasal cannula oxygen Cardiovascular: Normal heart sounds, no obvious peripheral edema, pulses regular with telemetry reading first-degree AV block Abdomen: Flexi-Seal tube in place to manage stools to prevent contamination  of wound Skin: Multiple wounds involving the posterior surface of the legs as well as upper posterior thighs, buttocks, perineum and sacrum.  Please see pictures in chart. Neurologic: CN 2-12 grossly intact. Sensation intact, DTR normal. Strength 4/5 x all 4 extremities.   Psychiatric: Normal judgment and insight. Alert and oriented x 3. Normal mood.    Assessment/Plan: Acute problems: Acute on Chronic Hypoxic and Hypercapnic Respiratory Failure in the setting of Sepsis, Morbid obesity, healthcare associated pneumonia, recurrent mucous plugging; -Sepsis physiology has resolved and PNA adequately treated -Prolonged respiratory failure requiring mechanical ventilation, failed attempt at extubation. -Status post tracheostomy on 1/26, wean off ventilator.  With subsequent downgrade to cuffless #6 trach on 2/15.  As of 2/25 patient accidentally self decannulated and decision made to leave tracheostomy tube out -Continue Vaseline gauze dressing covered by dry dressing over trach site until site closed -Once trach site closed patient will likely benefit from CPAP to use at bedtime and as needed given likelihood of underlying sleep apnea  Sepsis 2/2 Left buttock necrotizing fasciitis with E. coli, group B strep, Peptostreptococcus in wound culture: SEPSIS RESOLVED -Excisional debridement of perianal, gluteal and perineal necrotizing soft tissue infection by Dr. Freida Busman  1/10 -Back to OR 1/11 and excisional debridement of perineal necrosis, by Dr. Freida Busman. -Received hydrotherapy from 06/29/2020 until 07/04/2020. -No further debridement.  General surgery recommended wound care to follow-up. -Seen by infectious disease who recommended Unasyn followed by Augmentin for 31-month course due to actinomyces in the wound.  Patient currently on Augmentin. -As of 2/25 CRP remains elevated at 4.6, sed rate 138 -Continue with wound care and dressing changes. 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 Fib/acquired thrombophilia: VR Controlled.   Continue with amiodarone and diltiazem. Preadmission Xarelto discontinued per pharmacy recommendation given patient's body weight greater than 500 pounds Continue with Coumadin-INR therapeutic at 2.5  Diarrhea:  Laxative, has rectal tube to protect the wound. Change Senokot to daily Patient does have issues sometimes with stool becoming dry and hard and benefits from flushing of Flexi-Seal so order placed on 2/25  Pressure injury present on admission in context necrotizing skin infection/injury (Coccyx/sacrum/buttocks/perinuem/pretibial See documentation from wound care nurse Incision (Closed) 06/10/20 Coccyx Other (Comment) (Active)  Date First Assessed/Time First Assessed: 06/10/20 1250   Location: Coccyx  Location Orientation: Other (Comment)    Assessments 06/10/2020  2:30 PM 07/28/2020  7:30 PM  Dressing Type Gauze (Comment) Moist to dry  Dressing -- Clean;Dry;Intact  Dressing Change Frequency -- Twice a day  Site / Wound Assessment -- Dressing in place / Unable to assess     No Linked orders to display     Wound / Incision (Open or Dehisced)  06/10/20 Skin tear Pretibial Right;Anterior (Active)  Date First Assessed/Time First Assessed: 06/10/20 1400   Wound Type: Skin tear  Location: Pretibial  Location Orientation: Right;Anterior  Present on Admission: Yes    Assessments 06/10/2020  2:30 PM 07/28/2020  7:30 PM  Dressing Type Petroleum None  Dressing Changed Changed --  Dressing Status Clean;Dry;Intact --  Site / Wound Assessment -- Clean;Dry     No Linked orders to display     Pressure Injury 06/10/20 Heel Right Stage 2 -  Partial thickness loss of dermis presenting as a shallow open injury with a red, pink wound bed without slough. (Active)  Date First Assessed/Time First Assessed: 06/10/20 1400   Location: Heel  Location Orientation: Right  Staging: Stage 2 -  Partial thickness loss of dermis presenting as a shallow open injury with a red, pink wound bed without slough.  Present on Admis...    Assessments 06/10/2020  2:30 PM 07/28/2020  7:30 PM  Dressing Type Foam - Lift dressing to assess site every shift None  Dressing Clean;Dry;Intact Other (Comment)  Dressing Change Frequency -- Twice a day  Site / Wound Assessment -- Black;Dry     No Linked orders to display     Incision (Closed) 06/11/20 Perineum (Active)  Date First Assessed/Time First Assessed: 06/11/20 1525   Location: Perineum    Assessments 06/11/2020  8:00 PM 07/28/2020  7:30 PM  Dressing Type Gauze (Comment);Tape dressing Moist to dry  Dressing Intact Clean;Dry;Intact  Dressing Change Frequency Daily Twice a day  Site / Wound Assessment -- Dressing in place / Unable to assess     No Linked orders to display     Wound / Incision (Open or Dehisced) 06/10/20 Skin tear Pretibial Left (Active)  Date First Assessed/Time First Assessed: 06/10/20 1200   Wound Type: Skin tear  Location: Pretibial  Location Orientation: Left  Present on Admission: Yes    Assessments 06/11/2020  8:00 PM 07/28/2020  7:30 PM  Dressing Type Impregnated gauze (petrolatum);Abdominal  pads;Gauze (Comment) None  Dressing Change Frequency Twice a day --     No Linked orders to display     Wound / Incision (Open or Dehisced) 06/29/20 Buttocks (Active)  Date First Assessed/Time First Assessed: 06/29/20 1148   Location: Buttocks    Assessments 06/29/2020  2:53 PM 07/28/2020  7:30 PM  Wound Image     Dressing Type Abdominal pads;Barrier Film (skin prep);Gauze (Comment);Moist to moist Moist to dry  Dressing Changed Changed --  Dressing Status Dry;Clean;Intact Clean;Dry;Intact  Dressing Change Frequency Daily Twice a day  Site / Wound Assessment Bleeding;Black;Red;Yellow Dressing in place / Unable to assess  % Wound base Red or Granulating 60% --  % Wound base Yellow/Fibrinous Exudate 30% --  % Wound base Black/Eschar 10% --  % Wound base Other/Granulation Tissue (Comment) 0% --  Peri-wound Assessment Intact --  Wound Length (cm) 25 cm --  Wound Width (cm) 11 cm --  Wound Depth (cm) 10 cm --  Wound Volume (cm^3) 2750 cm^3 --  Wound Surface Area (cm^2) 275 cm^2 --  Margins Unattached edges (unapproximated) --  Drainage Amount Copious --  Drainage Description Serosanguineous --  Treatment Debridement (Selective);Hydrotherapy (Pulse lavage);Packing (Saline gauze) --     No Linked orders to display   Nutrition Status: Nutrition Problem: Increased nutrient needs Etiology: wound healing Signs/Symptoms: estimated needs Interventions: Ensure Enlive (each supplement provides 350kcal and 20 grams of protein),MVI,Premier Protein,Magic cup Estimated body mass index is 78.07 kg/m as calculated from the following:   Height as of this encounter: 5\' 10"  (1.778 m).   Weight as of this encounter: 246.8 kg.  Continue Ensure Plus, multiple vitamins  Other problems: hypernatremia  from diuresis and insensible losses: Resolved  Acute blood loss anemia  from blood loss from wound:  Status post blood transfusion. Hb  stable  Hypothyroidism:  Continue with Synthroid  Edema:  Continue with Lipitor Intermittent blurry vision:  Could be related to hypotension versus narcotics,  Also happens at home.    Data Reviewed: Basic Metabolic Panel: Recent Labs  Lab 07/23/20 0253 07/24/20 0808 07/25/20 0815 07/27/20 0814 07/29/20 0036  NA 135 136 135 134* 134*  K 3.3* 3.4* 3.7 4.0 3.4*  CL 92* 94* 94* 97* 95*  CO2 31 30 30 23 26   GLUCOSE 159* 138* 125* 183* 141*  BUN 9 8 8 8 10   CREATININE 0.96 0.98 1.02 0.99 0.85  CALCIUM 8.4* 8.3* 8.4* 8.2* 8.3*  Liver Function Tests: No results for input(s): AST, ALT, ALKPHOS, BILITOT, PROT, ALBUMIN in the last 168 hours. No results for input(s): LIPASE, AMYLASE in the last 168 hours. No results for input(s): AMMONIA in the last 168 hours. CBC: Recent Labs  Lab 07/23/20 0253 07/24/20 0808 07/25/20 0439 07/29/20 0036  WBC 7.0 6.9 7.2 6.2  HGB 9.0* 8.5* 9.0* 8.9*  HCT 28.0* 27.7* 29.5* 28.3*  MCV 86.4 88.2 88.1 86.8  PLT 244 258 290 309   Cardiac Enzymes: No results for input(s): CKTOTAL, CKMB, CKMBINDEX, TROPONINI in the last 168 hours. BNP (last 3 results) No results for input(s): BNP in the last 8760 hours.  ProBNP (last 3 results) No results for input(s): PROBNP in the last 8760 hours.  CBG: Recent Labs  Lab 07/27/20 2117 07/28/20 0650 07/28/20 1210 07/28/20 1725 07/28/20 2159  GLUCAP 109* 144* 130* 155* 131*    No results found for this or any previous visit (from the past 240 hour(s)).   Studies: No results found.  Scheduled Meds: . amiodarone  200 mg Oral Daily  . amoxicillin-clavulanate  1 tablet Oral Q8H  . atorvastatin  20 mg Oral QPM  . chlorhexidine  15 mL Mouth Rinse BID  . Chlorhexidine Gluconate Cloth  6 each Topical Daily  . diltiazem  240 mg Oral Daily  . feeding supplement  237 mL Oral BID BM  . insulin aspart  0-20 Units Subcutaneous TID WC  . insulin aspart  0-5 Units Subcutaneous QHS  . insulin aspart  18 Units Subcutaneous TID WC  . insulin detemir  42 Units  Subcutaneous BID  . levothyroxine  75 mcg Oral Daily  . mouth rinse  15 mL Mouth Rinse q12n4p  . multivitamin with minerals  1 tablet Oral Daily  . nortriptyline  50 mg Oral BID  . oxyCODONE  15 mg Oral Q12H  . pantoprazole  40 mg Oral Daily  . pregabalin  300 mg Oral BID  . Ensure Max Protein  11 oz Oral QHS  . senna-docusate  2 tablet Oral Daily  . sodium chloride flush  3 mL Intravenous Q12H  . Warfarin - Pharmacist Dosing Inpatient   Does not apply q1600   Continuous Infusions: . sodium chloride Stopped (06/28/20 2035)    Principal Problem:   Fournier gangrene Active Problems:   Sepsis (HCC)   DKA (diabetic ketoacidosis) (HCC)   AKI (acute kidney injury) (HCC)   Diabetic ulcer of heel (HCC)   Atrial fibrillation, chronic (HCC)   Essential hypertension   Dyslipidemia   Acquired hypothyroidism   Chronic pain disorder   Morbid obesity with BMI of 60.0-69.9, adult (HCC)   Acute respiratory failure (HCC)   Sepsis due to Streptococcus, group B (HCC)   Actinomycosis   Consultants:  General surgery  PCCM  Nephrology  Infectious disease  Procedures:  Irrigation and debridement of abscesses on 1/10 and 1/11  Antibiotics: Anti-infectives (From admission, onward)   Start     Dose/Rate Route Frequency Ordered Stop   07/12/20 1400  amoxicillin-clavulanate (AUGMENTIN) 875-125 MG per tablet 1 tablet        1 tablet Oral Every 8 hours 07/12/20 1003     06/27/20 1400  amoxicillin-clavulanate (AUGMENTIN) 875-125 MG per tablet 1 tablet  Status:  Discontinued        1 tablet Per Tube Every 8 hours 06/27/20 0939 07/12/20 1003   06/24/20 1615  Ampicillin-Sulbactam (UNASYN) 3 g in sodium chloride 0.9 % 100 mL IVPB  Status:  Discontinued        3 g 200 mL/hr over 30 Minutes Intravenous Every 6 hours 06/24/20 1517 06/27/20 0939   06/24/20 1300  amoxicillin (AMOXIL) capsule 500 mg        500 mg Oral  Once 06/24/20 1241 06/24/20 1256   06/24/20 1115  amoxicillin (AMOXIL) capsule  500 mg  Status:  Discontinued        500 mg Oral  Once 06/24/20 1025 06/24/20 1241   06/22/20 1415  meropenem (MERREM) 2 g in sodium chloride 0.9 % 100 mL IVPB  Status:  Discontinued        2 g 200 mL/hr over 30 Minutes Intravenous Every 8 hours 06/22/20 1324 06/24/20 1517   06/22/20 1400  meropenem (MERREM) 1 g in sodium chloride 0.9 % 100 mL IVPB  Status:  Discontinued        1 g 200 mL/hr over 30 Minutes Intravenous Every 8 hours 06/22/20 1248 06/22/20 1324   06/22/20 1300  doxycycline (VIBRAMYCIN) 100 mg in sodium chloride 0.9 % 250 mL IVPB  Status:  Discontinued        100 mg 125 mL/hr over 120 Minutes Intravenous Every 12 hours 06/22/20 1212 06/22/20 1248   06/20/20 1100  vancomycin (VANCOCIN) 2,500 mg in sodium chloride 0.9 % 500 mL IVPB  Status:  Discontinued        2,500 mg 250 mL/hr over 120 Minutes Intravenous Every 12 hours 06/20/20 1007 06/22/20 0813   06/20/20 1100  ceFEPIme (MAXIPIME) 2 g in sodium chloride 0.9 % 100 mL IVPB  Status:  Discontinued        2 g 200 mL/hr over 30 Minutes Intravenous Every 8 hours 06/20/20 1007 06/22/20 1207   06/17/20 1130  metroNIDAZOLE (FLAGYL) IVPB 500 mg  Status:  Discontinued        500 mg 100 mL/hr over 60 Minutes Intravenous Every 8 hours 06/17/20 1041 06/22/20 1248   06/13/20 1400  cefTRIAXone (ROCEPHIN) 2 g in sodium chloride 0.9 % 100 mL IVPB  Status:  Discontinued        2 g 200 mL/hr over 30 Minutes Intravenous Every 24 hours 06/13/20 1242 06/20/20 0944   06/11/20 1300  vancomycin (VANCOREADY) IVPB 2000 mg/400 mL        2,000 mg 200 mL/hr over 120 Minutes Intravenous  Once 06/11/20 1212 06/11/20 1824   06/10/20 2000  clindamycin (CLEOCIN) IVPB 900 mg        900 mg 100 mL/hr over 30 Minutes Intravenous Every 8 hours 06/10/20 1307 06/13/20 1500   06/10/20 1600  ceFEPIme (MAXIPIME) 2 g in sodium chloride 0.9 % 100 mL IVPB  Status:  Discontinued        2 g 200 mL/hr over 30 Minutes Intravenous Every 12 hours 06/10/20 1504 06/13/20  1242   06/10/20 1400  metroNIDAZOLE (FLAGYL) IVPB 500 mg  Status:  Discontinued        500 mg 100 mL/hr over 60 Minutes Intravenous Every 8 hours 06/10/20 1023 06/10/20 1308   06/10/20 1300  aztreonam (AZACTAM) 2 g in sodium chloride 0.9 % 100 mL IVPB  Status:  Discontinued        2 g 200 mL/hr over 30 Minutes Intravenous Every 8 hours 06/10/20 1033 06/10/20 1504   06/10/20 1215  clindamycin (CLEOCIN) IVPB 900 mg        900 mg 100 mL/hr over 30 Minutes Intravenous To Surgery 06/10/20 1210 06/10/20 1210   06/10/20 1033  vancomycin variable dose  per unstable renal function (pharmacist dosing)  Status:  Discontinued         Does not apply See admin instructions 06/10/20 1033 06/12/20 1252   06/10/20 0600  metroNIDAZOLE (FLAGYL) tablet 500 mg  Status:  Discontinued        500 mg Oral Every 8 hours 06/10/20 0406 06/10/20 0948   06/10/20 0415  aztreonam (AZACTAM) 2 g in sodium chloride 0.9 % 100 mL IVPB        2 g 200 mL/hr over 30 Minutes Intravenous  Once 06/10/20 0406 06/10/20 0546   06/10/20 0415  vancomycin (VANCOCIN) IVPB 1000 mg/200 mL premix  Status:  Discontinued        1,000 mg 200 mL/hr over 60 Minutes Intravenous  Once 06/10/20 0406 06/10/20 0412   06/10/20 0415  vancomycin (VANCOCIN) 2,500 mg in sodium chloride 0.9 % 500 mL IVPB        2,500 mg 250 mL/hr over 120 Minutes Intravenous  Once 06/10/20 0412 06/10/20 8338       Time spent: 60 minutes    Junious Silk ANP  [facility] 7 am - 330 pm/M-F for direct patient care and secure chat Please refer to Amion for contact info 49  days

## 2020-07-29 NOTE — Progress Notes (Signed)
ANTICOAGULATION CONSULT NOTE  Pharmacy Consult for Warfarin  Indication: atrial fibrillation  Patient Measurements: Height: 5\' 10"  (177.8 cm) Weight: (!) 246.8 kg (544 lb 1.5 oz) IBW/kg (Calculated) : 73 Heparin Dosing Weight: 149.7 kg  Vital Signs: Temp: 98.2 F (36.8 C) (02/28 0629) Temp Source: Oral (02/28 0629) BP: 149/82 (02/28 0629) Pulse Rate: 94 (02/28 0629)  Labs: Recent Labs    07/27/20 0026 07/27/20 0814 07/28/20 0247 07/29/20 0036  HGB  --   --   --  8.9*  HCT  --   --   --  28.3*  PLT  --   --   --  309  LABPROT 24.5*  --  25.6* 27.3*  INR 2.3*  --  2.4* 2.6*  CREATININE  --  0.99  --  0.85    Estimated Creatinine Clearance: 214.2 mL/min (by C-G formula based on SCr of 0.85 mg/dL).   Assessment: 49YOM admitted with septic shock secondary to necrotizing fascitis. Was on Xarelto PTA, but that is not appropriate based on patient's weight. Pharmacy has been consulted to dose warfarin.  Patient sensitive to Warfarin in setting of drug interactions with Amiodarone and Augmentin resulting in Warfarin being held intermittently for high INR. Patient intake 100% with every meal. Warfarin most recently held 2/20 to 2/22 after ~5 mg/day average resulted in elevated INR of 3.9. Warfarin was resumed again on 2/23 and has been therapeutic on ~3mg /d.  Goal of Therapy:  INR 2-3 Monitor platelets by anticoagulation protocol: Yes  Plan:  Schedule warfarin 3 mg daily F/u QMon/Thur INR and weekly CBC Monitor for s/sx of bleeding  3/23, PharmD, BCPS, BCCP Clinical Pharmacist  Please check AMION for all [facility] phone numbers After 10:00 PM, call Main Pharmacy [phone]

## 2020-07-30 DIAGNOSIS — N493 Fournier gangrene: Secondary | ICD-10-CM | POA: Diagnosis not present

## 2020-07-30 LAB — GLUCOSE, CAPILLARY
Glucose-Capillary: 142 mg/dL — ABNORMAL HIGH (ref 70–99)
Glucose-Capillary: 122 mg/dL — ABNORMAL HIGH (ref 70–99)
Glucose-Capillary: 125 mg/dL — ABNORMAL HIGH (ref 70–99)
Glucose-Capillary: 195 mg/dL — ABNORMAL HIGH (ref 70–99)
Glucose-Capillary: 130 mg/dL — ABNORMAL HIGH (ref 70–99)

## 2020-07-30 MED ORDER — POLYETHYLENE GLYCOL 3350 17 G PO PACK
17.0000 g | PACK | Freq: Every day | ORAL | Status: DC
  Administered 2020-07-30 – 2020-08-01 (×3): 17 g via ORAL
  Filled 2020-07-30 (×3): qty 1

## 2020-07-30 MED ORDER — OXYCODONE HCL ER 10 MG PO T12A
20.0000 mg | EXTENDED_RELEASE_TABLET | Freq: Two times a day (BID) | ORAL | Status: DC
Start: 2020-07-30 — End: 2020-08-06
  Administered 2020-07-30 – 2020-08-05 (×14): 20 mg via ORAL
  Filled 2020-07-30 (×15): qty 2

## 2020-07-30 MED ORDER — OXYCODONE HCL 5 MG PO TABS
15.0000 mg | ORAL_TABLET | ORAL | Status: DC | PRN
  Administered 2020-07-30 – 2020-08-04 (×20): 15 mg via ORAL
  Filled 2020-07-30 (×21): qty 3

## 2020-07-30 NOTE — Progress Notes (Signed)
Inpatient Rehab Admissions Coordinator:   Met with patient at bedside to discuss potential CIR admission. Pt. Stated interest. Will pursue for potential admit next week, pending bed availability.  Julio Zappia, MS, CCC-SLP Rehab Admissions Coordinator  [phone] (celll) [phone] (office) 

## 2020-07-30 NOTE — Progress Notes (Signed)
Inpatient Rehab Admissions Coordinator:   Pt continues to show progress with therapies.  Would recommend a CIR consult at this point and I will place an order per our protocol.   Estill Dooms, PT, DPT Admissions Coordinator [phone] 07/30/20  11:59 AM

## 2020-07-30 NOTE — Progress Notes (Addendum)
Occupational Therapy Treatment Patient Details Name: Carl Hill MRN: [id number] DOB: 14-Sep-1970 Today's Date: 07/30/2020    History of present illness Pt is a 50 year old male who presented with buttock pain, fatigue and malaise.  Found to have hyperglycemia. CT pelvis demonstrated necrotizing fasciitis in L buttock, L upper thigh, and perineum. Taken to OR for debridement 1/10 c/b Afib with RVR and VT arrest s/p CPR/defib. Trached 1/26 PMHx: HTN, obesity, DM, AFib.  As of 07/04/20 started TC trials.  Accidental self decannulated 07/26/20 and left out.   OT comments  Excellent session with OT today. NT assisted during session. Able to progress to EOB with min A +2 for safety with pt directing how to help him given his increased body habitus. Able to sit EOB at least 20 min (pt could have sat longer) with Max HR 138 and SpO2 above 90 on RA. No complaints of dizziness. Able to complete functional tasks in seated position. Completed 3 trials of sit - stand using recliner as base in front of him, pushing on footboard with RUE and using recliner with LUE. B knees blocked and barisheet used under hips as fulcrum. Pt with increased clearance of butt from bed however not able to achieve full upright standing. Pt able to return to supine with minguard A and assist for operation of bed position but no physical assistance needed. Pt is an excellent CIR candidate and is incredibly motivated to work with OT & PT. Recommend [place] nurse assessing his wound to help determine if sitting on his butt using a Geomat would be sufficient to allow for wound healing. If so, we could progress pt OOB to a barichair to increase his sitting tolerance. Pt would benefit from being moved to a [facility] room as well. Since pt is on a Tilt bed, it would be beneficial for nursing to tilt pt at least 2x during the day to progress his mobility. Will discuss with bed rep and PT.  Pt incredibly appreciative.   Follow Up Recommendations   CIR;Supervision/Assistance - 24 hour    Equipment Recommendations  Wheelchair (measurements OT);Wheelchair cushion (measurements OT);Hospital bed;3 in 1 bedside commode    Recommendations for Other Services Rehab consult    Precautions / Restrictions Precautions Precautions: Fall Precaution Comments: rectal tube, foley catheter, large buttocks wound, hydro on hold       Mobility Bed Mobility Overal bed mobility: Needs Assistance   Rolling: Modified independent (Device/Increase time)   Supine to sit: Min assist;+2 for safety/equipment Sit to supine: Min assist;+2 for safety/equipment;HOB elevated   General bed mobility comments: Pt with heavy use of rails adn directing what set up works for him    Transfers Overall transfer level: Needs assistance Equipment used:  Building services engineer) Transfers: Sit to/from [employer] transfer comment: recliner placed in front of pt with pt pushing from the footboard with his RUE and using LUE against recliner; Bed height increased to help pt with lift off. able to begin to lift bottom from bed    Balance     Sitting balance-Leahy Scale: Good       Standing balance-Leahy Scale: Zero                             ADL either performed or assessed with clinical judgement   ADL Overall ADL's : Needs assistance/impaired Eating/Feeding: Modified independent   Grooming: Set up;Sitting;Bed level  Upper Body Bathing: Set up;Sitting   Lower Body Bathing: Moderate assistance;Sitting/lateral leans;Bed level   Upper Body Dressing : Set up;Supervision/safety;Bed level   Lower Body Dressing: Total assistance               Functional mobility during ADLs: Maximal assistance;+2 for physical assistance       Vision       Perception     Praxis      Cognition Arousal/Alertness: Awake/alert Behavior During Therapy: WFL for tasks assessed/performed Overall Cognitive Status: Within Functional Limits for tasks  assessed                                          Exercises Exercises:  (pt completing theraband exercises on his own) Other Exercises Other Exercises: leg extension on side lying  - encouraged pt to buildd up to 100 repetitions/day   Shoulder Instructions       General Comments Max HR 138    Pertinent Vitals/ Pain       Pain Assessment: Faces Faces Pain Scale: Hurts whole lot Pain Location: pain from feeling constipated Pain Descriptors / Indicators: Grimacing;Guarding;Moaning Pain Intervention(s): Limited activity within patient's tolerance;Premedicated before session;Relaxation  Home Living                                          Prior Functioning/Environment              Frequency  Min 2X/week        Progress Toward Goals  OT Goals(current goals can now be found in the care plan section)  Progress towards OT goals: Goals met and updated - see care plan  Acute Rehab OT Goals Patient Stated Goal: to be able to stand and take care of himself OT Goal Formulation: With patient Time For Goal Achievement: 08/13/20 Potential to Achieve Goals: Good ADL Goals Pt Will Perform Eating: with modified independence;with adaptive utensils Pt Will Perform Grooming: with set-up;sitting Pt Will Perform Upper Body Bathing: with set-up;bed level Pt Will Perform Lower Body Bathing: with mod assist;bed level;with adaptive equipment Pt Will Perform Upper Body Dressing: with set-up;sitting Pt/caregiver will Perform Home Exercise Program: Increased strength;Both right and left upper extremity;With Supervision;With theraband Additional ADL Goal #1: Pt will tolerate seated position x 10 min in preparation for ADL tasks Additional ADL Goal #2: Pt will tolerate x5 mins of unsupported sitting for dynamic sitting balance tasks with O2 >90%. Additional ADL Goal #3: Pt will tolerate standing @ 60 degrees x 5 min in preparation for ADL tasks and mobility   Plan Discharge plan remains appropriate    Co-evaluation                 AM-PAC OT "6 Clicks" Daily Activity     Outcome Measure   Help from another person eating meals?: None Help from another person taking care of personal grooming?: A Little Help from another person toileting, which includes using toliet, bedpan, or urinal?: Total Help from another person bathing (including washing, rinsing, drying)?: A Lot Help from another person to put on and taking off regular upper body clothing?: A Little Help from another person to put on and taking off regular lower body clothing?: Total 6 Click Score: 14    End of Session  OT Visit Diagnosis: Unsteadiness on feet (R26.81);Other abnormalities of gait and mobility (R26.89);Muscle weakness (generalized) (M62.81);Pain Pain - part of body:  (pain from feeling constipated)   Activity Tolerance Patient tolerated treatment well   Patient Left in bed;with call bell/phone within reach   Nurse Communication Mobility status;Other (comment) (progress)        Time: 0940-7680 OT Time Calculation (min): 58 min  Charges: OT General Charges $OT Visit: 1 Visit OT Treatments $Self Care/Home Management : 8-22 mins $Therapeutic Activity: 8-22 mins $Neuromuscular Re-education: 23-37 mins  Maurie Boettcher, OT/L   Acute OT Clinical Specialist [place] Pager [phone] Office [phone]    [facility] 07/30/2020, 9:54 AM

## 2020-07-30 NOTE — Progress Notes (Signed)
[facility] PROGRESS NOTE  Carl Hill NFA:[id number] DOB: [birth year] DOA: 06/10/2020 PCP: Carl So., MD  Status: Remains inpatient appropriate because:Altered mental status, Unsafe d/c plan and Inpatient level of care appropriate due to severity of illness   Dispo:               The patient is from: Home              Anticipated d/c is to: CIR-as of 2/28 therapy session pt improving enough to likely be reconsidered for CIR per admit coordinator FU note               Patient currently is not medically stable to d/c.  Patient had recent issues 2/2 intermittent orthostatic hypotension with efforts to stand utilizing specialty bed but as of 2/28 improved. Able to sit on EOB x 10 minutes on 2/28.  In addition patient has a very large deep combine decubitus involving the buttocks, sacrum, coccyx as well as wounds on both lower extremities.  These wounds occurred in the context of necrotizing soft tissue injury/fasciitis.  Patient is morbidly obese greater than 500 pounds and requires multiple providers for repositioning and providing wound care twice daily.              Barriers to DC: Morbid obesity, need for aggressive wound care, continued issues with intermittent orthostatic hypotension with activity and physical therapy.  Is very motivated to improve and return to the home environment   Difficult to place patient Yes   Level of care: Telemetry Medical  Code Status: Full Family Communication:  DVT prophylaxis: Warfarin Vaccination status: Fully vaccinated as of June 2021/Moderna; discussed with patient and on 2/25 Moderna booster shot given  Foley catheter: 16 Fr Foley placed 2/71  HPI: 50 year old male with history of morbid obesity, hypertension, diabetes with peripheral lower extremity neuropathy, atrial fibrillation.  He initially presented to the ER on 1/10 with reports of hyperglycemia with glucose greater than 406.  He was symptomatic with dizziness lightheadedness  and headache.  He reports chronic lower extremity wounds on the left heel the recent left calf skin tear.  In route to the hospital he sustained a skin tear of his right calf.  His mother had also noticed a boil on his buttocks on Thursday.  Patient at baseline is ambulatory with a cane but due to current illness mother has been assisting with his care including pericare.  On exam in the ER patient was found to have what appeared to be significant necrotizing soft tissue infection/injury to the buttocks, perineum and upper thigh regions.  Surgery was urgently consulted and patient was taken to the OR.  See below regarding other significant events.  Since that time patient has transitioned out of the ICU initially to the progressive care unit and as of the past 24 hours to 6 N. surgical unit.  Patient had been stable on a trach collar and accidentally decannulated himself this morning.  PCCM was notified and recommended leaving the trach out and patient has done well since that time.  Patient had been undergoing aggressive wound care to include hydrotherapy with significant improvement in the wound base and resolution of necrotic and other debris.  He is now tolerating saline packing wound dressings to all of his wounds twice daily.  He continues to have significant pain in the decubitus area as well as worsening neuropathy pain in both feet as PT has worked with him and he is starting to do  some partial weightbearing.  Patient very talkative and animated today on 2/25 and is very eager to continue therapy and eventually return home.  Significant Hospital events: 1/10 Admitted to [facility] with sepsis, necrotizing fasciitis. General surgery consulted and debridement pursued. In the afternoon, patient developed hypotension with AF in RVR. Synchronized cardioversion unsuccessful with conversion to VT. CPR initated with ROSC after desynchronized defibrillation. 1/11 Debridement 1/18 Mucus plugging 1/19 Bronchoscopy  >> mucus plug on Rt, airway collapse with exhalation 1/20 Persistent fever, change ABx 1/24 Increased vent requirements, habitus related +/- mucus plugging 1/25 Hypotensive to SBP 80s, Levo resumed, minimally responsive overnight off sedation, CT Head negative 1/26 Stable vent/pressor requirements, bronched, tracheostomy 1/29 start hydrotherapy 1/30 start lovenox/coumadin 2/2 Tolerating PS 2/4 did TC for 12 hours 2/5 Did 24 hours TC 2/6 Continue TC  2/8 downgrade to progressive 2/15 trach changed to # 6 cuffless  2/25 Self decannulated-trach left out   Subjective: Awake alert.  Participating aggressively with PT at bedside.  Sitting on edge of bed.  Complaining of some buttock wound pain but also having issues with posterior thighs touching side rails with pressure and causing pain.  Also complaining of cramping pain secondary to laxatives and requested to change  Objective: Vitals:   07/30/20 0319 07/30/20 0642  BP: 131/69 139/76  Pulse: 90 (!) 101  Resp: 18 19  Temp: 97.6 F (36.4 C) 97.7 F (36.5 C)  SpO2: 98% 100%    Intake/Output Summary (Last 24 hours) at 07/30/2020 0815 Last data filed at 07/29/2020 2200 Gross per 24 hour  Intake 1190 ml  Output 2300 ml  Net -1110 ml   Filed Weights   07/26/20 0500 07/27/20 0500 07/28/20 0500  Weight: (!) 247.6 kg (!) 246.8 kg (!) 246.8 kg    Exam:  Constitutional: Awake alert and in no significant distress Neck: Trach site closed without any audible wheezing Respiratory: Lungs are clear to auscultation and he is stable on room air at Cardiovascular: Pulse is regular and does become mildly tachycardic with activity, no definitive peripheral edema Abdomen: Flexi-Seal tube in place to manage stools to prevent contamination of wound Skin: Multiple wounds involving the posterior surface of the legs as well as upper posterior thighs, buttocks, perineum and sacrum.  Please see pictures in chart. Neurologic: CN 2-12 grossly intact.  Sensation intact, DTR normal. Strength 4/5 x all 4 extremities.   Psychiatric: Alert and oriented x3, pleasant affect   Assessment/Plan: Acute problems: Acute on Chronic Hypoxic and Hypercapnic Respiratory Failure in the setting of Sepsis, Morbid obesity, healthcare associated pneumonia, recurrent mucous plugging; -Sepsis physiology has resolved and PNA adequately treated -Prolonged respiratory failure requiring mechanical ventilation, failed attempt at extubation. -Status post tracheostomy on 1/26, wean off ventilator.  With subsequent downgrade to cuffless #6 trach on 2/15.  As of 2/25 patient accidentally self decannulated and decision made to leave tracheostomy tube out -Continue Vaseline gauze dressing covered by dry dressing over trach site until site closed -Once trach site closed patient will likely benefit from CPAP to use at bedtime and as needed given likelihood of underlying sleep apnea  Sepsis 2/2 Left buttock necrotizing fasciitis with E. coli, group B strep, Peptostreptococcus in wound culture: SEPSIS RESOLVED -Excisional debridement of perianal, gluteal and perineal necrotizing soft tissue infection by Dr. Freida Busman 1/10 -Back to OR 1/11 and excisional debridement of perineal necrosis, by Dr. Freida Busman. -Received hydrotherapy from 06/29/2020 until 07/04/2020. -No further debridement.  General surgery recommended wound care to follow-up. -Seen by infectious disease  who recommended Unasyn followed by Augmentin for 853-month course due to actinomyces in the wound.  Patient currently on Augmentin. -As of 2/25 CRP remains elevated at 4.6, sed rate 138 -Continue with wound care and dressing changes. 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 Fib/acquired thrombophilia: VR Controlled.   Continue with amiodarone and diltiazem. Preadmission Xarelto discontinued per pharmacy recommendation given patient's body weight greater than 500 pounds Continue with Coumadin-INR therapeutic at 2.5  Physical deconditioning Markedly improved now sitting on edge of bed for the first time in greater than 43 days CIR reevaluating patient for possible admission Continue PT and OT  Diarrhea:  Has rectal tube to protect the wound. Patient reporting abdominal cramping regularly with use of laxatives for have discontinued Senokot and have changed MiraLAX to scheduled at bedtime Patient does have issues sometimes with stool becoming dry and hard and benefits from flushing of Flexi-Seal so order placed on 2/25  Pressure injury present on admission in context necrotizing skin infection/injury (Coccyx/sacrum/buttocks/perinuem/pretibial See documentation from wound care nurse Incision (Closed) 06/10/20 Coccyx Other (Comment) (Active)  Date First Assessed/Time First Assessed: 06/10/20 1250   Location: Coccyx  Location Orientation: Other (Comment)    Assessments 06/10/2020  2:30 PM 07/29/2020  9:45 AM  Dressing Type Gauze (Comment) Moist to dry  Dressing -- Clean;Dry;Intact  Dressing Change Frequency -- Twice a day     No Linked  orders to display     Wound / Incision (Open or Dehisced) 06/10/20 Skin tear Pretibial Right;Anterior (Active)  Date First Assessed/Time First Assessed: 06/10/20 1400   Wound Type: Skin tear  Location: Pretibial  Location Orientation: Right;Anterior  Present on Admission: Yes    Assessments 06/10/2020  2:30 PM 07/29/2020  9:45 AM  Dressing Type Petroleum None  Dressing Changed Changed Changed  Dressing Status Clean;Dry;Intact Clean;Dry;Intact  Dressing Change Frequency -- Twice a day  Site / Wound Assessment -- Clean;Dry     No Linked orders to display     Pressure Injury 06/10/20 Heel Right Stage 2 -  Partial thickness loss of dermis presenting as a shallow open injury with a red, pink wound bed without slough. (Active)  Date First Assessed/Time First Assessed: 06/10/20 1400   Location: Heel  Location Orientation: Right  Staging: Stage 2 -  Partial thickness loss of dermis presenting as a shallow open injury with a  red, pink wound bed without slough.  Present on Admis...    Assessments 06/10/2020  2:30 PM 07/30/2020  7:43 AM  Dressing Type Foam - Lift dressing to assess site every shift None  Dressing Clean;Dry;Intact --  State of Healing -- Fully granulated  Site / Wound Assessment -- Clean;Dry;Brown  Peri-wound Assessment -- Intact  Drainage Amount -- None  Treatment -- Other (Comment)     No Linked orders to display     Incision (Closed) 06/11/20 Perineum (Active)  Date First Assessed/Time First Assessed: 06/11/20 1525   Location: Perineum    Assessments 06/11/2020  8:00 PM 07/29/2020  9:45 AM  Dressing Type Gauze (Comment);Tape dressing Moist to dry  Dressing Intact Clean;Dry  Dressing Change Frequency Daily Twice a day     No Linked orders to display     Wound / Incision (Open or Dehisced) 06/10/20 Skin tear Pretibial Left (Active)  Date First Assessed/Time First Assessed: 06/10/20 1200   Wound Type: Skin tear  Location: Pretibial  Location Orientation: Left  Present on  Admission: Yes    Assessments 06/11/2020  8:00 PM 07/29/2020  9:45 AM  Dressing Type Impregnated gauze (petrolatum);Abdominal pads;Gauze (Comment) None  Dressing Changed -- Changed  Dressing Status -- Clean;Dry;Intact  Dressing Change Frequency Twice a day Twice a day  Site / Wound Assessment -- Red;Yellow;Bleeding     No Linked orders to display     Wound / Incision (Open or Dehisced) 06/29/20 Buttocks (Active)  Date First Assessed/Time First Assessed: 06/29/20 1148   Location: Buttocks    Assessments 06/29/2020  2:53 PM 07/30/2020  3:18 AM  Wound Image     Dressing Type Abdominal pads;Barrier Film (skin prep);Gauze (Comment);Moist to moist Abdominal pads;Gauze (Comment);Tape dressing;Moist to dry  Dressing Changed Changed Changed  Dressing Status Dry;Clean;Intact Clean;Dry;Intact  Dressing Change Frequency Daily --  Site / Wound Assessment Bleeding;Black;Red;Yellow Pink;Yellow;Red  % Wound base Red or Granulating 60% --  % Wound base Yellow/Fibrinous Exudate 30% --  % Wound base Black/Eschar 10% --  % Wound base Other/Granulation Tissue (Comment) 0% --  Peri-wound Assessment Intact Intact  Wound Length (cm) 25 cm --  Wound Width (cm) 11 cm --  Wound Depth (cm) 10 cm --  Wound Volume (cm^3) 2750 cm^3 --  Wound Surface Area (cm^2) 275 cm^2 --  Margins Unattached edges (unapproximated) Unattached edges (unapproximated)  Closure -- None  Drainage Amount Copious Moderate  Drainage Description Serosanguineous Purulent;Serosanguineous  Treatment Debridement (Selective);Hydrotherapy (Pulse lavage);Packing (Saline gauze) Cleansed;Packing (Saline gauze)     No Linked orders to display   Nutrition Status: Nutrition Problem: Increased nutrient needs Etiology: wound healing Signs/Symptoms: estimated needs Interventions: Ensure Enlive (each supplement provides 350kcal and 20 grams of protein),MVI,Premier Protein,Magic cup Estimated body mass index is 78.07 kg/m as calculated from the  following:   Height as of this encounter:  (1.778 m).   Weight as of this encounter: 246.8 kg.  Continue Ensure Plus, multiple vitamins  Other problems: hypernatremia  from diuresis and insensible losses: Resolved  Acute blood loss anemia  from blood loss from wound:  Status post blood transfusion. Hb  stable  Hypothyroidism:  Continue with Synthroid  Edema: Continue with Lipitor Intermittent blurry vision:  Could be related to hypotension versus narcotics,  Also happens at home.    Data Reviewed: Basic Metabolic Panel: Recent Labs  Lab 07/24/20 0808 07/25/20 0815 07/27/20 0814 07/29/20 0036  NA 136 135 134* 134*  K 3.4* 3.7 4.0 3.4*  CL 94* 94*  97* 95*  CO2 30 30 23 26   GLUCOSE 138* 125* 183* 141*  BUN 8 8 8 10   CREATININE 0.98 1.02 0.99 0.85  CALCIUM 8.3* 8.4* 8.2* 8.3*   Liver Function Tests: No results for input(s): AST, ALT, ALKPHOS, BILITOT, PROT, ALBUMIN in the last 168 hours. No results for input(s): LIPASE, AMYLASE in the last 168 hours. No results for input(s): AMMONIA in the last 168 hours. CBC: Recent Labs  Lab 07/24/20 0808 07/25/20 0439 07/29/20 0036  WBC 6.9 7.2 6.2  HGB 8.5* 9.0* 8.9*  HCT 27.7* 29.5* 28.3*  MCV 88.2 88.1 86.8  PLT 258 290 309   Cardiac Enzymes: No results for input(s): CKTOTAL, CKMB, CKMBINDEX, TROPONINI in the last 168 hours. BNP (last 3 results) No results for input(s): BNP in the last 8760 hours.  ProBNP (last 3 results) No results for input(s): PROBNP in the last 8760 hours.  CBG: Recent Labs  Lab 07/29/20 1246 07/29/20 1704 07/29/20 2056 07/30/20 0646 07/30/20 0737  GLUCAP 203* 166* 103* 142* 122*    No results found for this or any previous visit (from the past 240 hour(s)).   Studies: No results found.  Scheduled Meds: . amiodarone  200 mg Oral Daily  . amoxicillin-clavulanate  1 tablet Oral Q8H  . atorvastatin  20 mg Oral QPM  . chlorhexidine  15 mL Mouth Rinse BID  . Chlorhexidine  Gluconate Cloth  6 each Topical Daily  . diltiazem  240 mg Oral Daily  . feeding supplement  237 mL Oral BID BM  . insulin aspart  0-20 Units Subcutaneous TID WC  . insulin aspart  0-5 Units Subcutaneous QHS  . insulin aspart  18 Units Subcutaneous TID WC  . insulin detemir  42 Units Subcutaneous BID  . levothyroxine  75 mcg Oral Daily  . mouth rinse  15 mL Mouth Rinse q12n4p  . multivitamin with minerals  1 tablet Oral Daily  . nortriptyline  50 mg Oral BID  . oxyCODONE  15 mg Oral Q12H  . pantoprazole  40 mg Oral Daily  . pregabalin  300 mg Oral BID  . Ensure Max Protein  11 oz Oral QHS  . senna-docusate  2 tablet Oral Daily  . sodium chloride flush  3 mL Intravenous Q12H  . warfarin  3 mg Oral q1600  . Warfarin - Pharmacist Dosing Inpatient   Does not apply q1600   Continuous Infusions: . sodium chloride Stopped (06/28/20 2035)    Principal Problem:   Fournier gangrene Active Problems:   Sepsis (HCC)   DKA (diabetic ketoacidosis) (HCC)   AKI (acute kidney injury) (HCC)   Diabetic ulcer of heel (HCC)   Atrial fibrillation, chronic (HCC)   Essential hypertension   Dyslipidemia   Acquired hypothyroidism   Chronic pain disorder   Morbid obesity with BMI of 60.0-69.9, adult (HCC)   Acute respiratory failure (HCC)   Sepsis due to Streptococcus, group B (HCC)   Actinomycosis   Consultants:  General surgery  PCCM  Nephrology  Infectious disease  Procedures:  Irrigation and debridement of abscesses on 1/10 and 1/11  Antibiotics: Anti-infectives (From admission, onward)   Start     Dose/Rate Route Frequency Ordered Stop   07/12/20 1400  amoxicillin-clavulanate (AUGMENTIN) 875-125 MG per tablet 1 tablet        1 tablet Oral Every 8 hours 07/12/20 1003     06/27/20 1400  amoxicillin-clavulanate (AUGMENTIN) 875-125 MG per tablet 1 tablet  Status:  Discontinued  1 tablet Per Tube Every 8 hours 06/27/20 0939 07/12/20 1003   06/24/20 1615  Ampicillin-Sulbactam  (UNASYN) 3 g in sodium chloride 0.9 % 100 mL IVPB  Status:  Discontinued        3 g 200 mL/hr over 30 Minutes Intravenous Every 6 hours 06/24/20 1517 06/27/20 0939   06/24/20 1300  amoxicillin (AMOXIL) capsule 500 mg        500 mg Oral  Once 06/24/20 1241 06/24/20 1256   06/24/20 1115  amoxicillin (AMOXIL) capsule 500 mg  Status:  Discontinued        500 mg Oral  Once 06/24/20 1025 06/24/20 1241   06/22/20 1415  meropenem (MERREM) 2 g in sodium chloride 0.9 % 100 mL IVPB  Status:  Discontinued        2 g 200 mL/hr over 30 Minutes Intravenous Every 8 hours 06/22/20 1324 06/24/20 1517   06/22/20 1400  meropenem (MERREM) 1 g in sodium chloride 0.9 % 100 mL IVPB  Status:  Discontinued        1 g 200 mL/hr over 30 Minutes Intravenous Every 8 hours 06/22/20 1248 06/22/20 1324   06/22/20 1300  doxycycline (VIBRAMYCIN) 100 mg in sodium chloride 0.9 % 250 mL IVPB  Status:  Discontinued        100 mg 125 mL/hr over 120 Minutes Intravenous Every 12 hours 06/22/20 1212 06/22/20 1248   06/20/20 1100  vancomycin (VANCOCIN) 2,500 mg in sodium chloride 0.9 % 500 mL IVPB  Status:  Discontinued        2,500 mg 250 mL/hr over 120 Minutes Intravenous Every 12 hours 06/20/20 1007 06/22/20 0813   06/20/20 1100  ceFEPIme (MAXIPIME) 2 g in sodium chloride 0.9 % 100 mL IVPB  Status:  Discontinued        2 g 200 mL/hr over 30 Minutes Intravenous Every 8 hours 06/20/20 1007 06/22/20 1207   06/17/20 1130  metroNIDAZOLE (FLAGYL) IVPB 500 mg  Status:  Discontinued        500 mg 100 mL/hr over 60 Minutes Intravenous Every 8 hours 06/17/20 1041 06/22/20 1248   06/13/20 1400  cefTRIAXone (ROCEPHIN) 2 g in sodium chloride 0.9 % 100 mL IVPB  Status:  Discontinued        2 g 200 mL/hr over 30 Minutes Intravenous Every 24 hours 06/13/20 1242 06/20/20 0944   06/11/20 1300  vancomycin (VANCOREADY) IVPB 2000 mg/400 mL        2,000 mg 200 mL/hr over 120 Minutes Intravenous  Once 06/11/20 1212 06/11/20 1824   06/10/20 2000   clindamycin (CLEOCIN) IVPB 900 mg        900 mg 100 mL/hr over 30 Minutes Intravenous Every 8 hours 06/10/20 1307 06/13/20 1500   06/10/20 1600  ceFEPIme (MAXIPIME) 2 g in sodium chloride 0.9 % 100 mL IVPB  Status:  Discontinued        2 g 200 mL/hr over 30 Minutes Intravenous Every 12 hours 06/10/20 1504 06/13/20 1242   06/10/20 1400  metroNIDAZOLE (FLAGYL) IVPB 500 mg  Status:  Discontinued        500 mg 100 mL/hr over 60 Minutes Intravenous Every 8 hours 06/10/20 1023 06/10/20 1308   06/10/20 1300  aztreonam (AZACTAM) 2 g in sodium chloride 0.9 % 100 mL IVPB  Status:  Discontinued        2 g 200 mL/hr over 30 Minutes Intravenous Every 8 hours 06/10/20 1033 06/10/20 1504   06/10/20 1215  clindamycin (CLEOCIN) IVPB  900 mg        900 mg 100 mL/hr over 30 Minutes Intravenous To Surgery 06/10/20 1210 06/10/20 1210   06/10/20 1033  vancomycin variable dose per unstable renal function (pharmacist dosing)  Status:  Discontinued         Does not apply See admin instructions 06/10/20 1033 06/12/20 1252   06/10/20 0600  metroNIDAZOLE (FLAGYL) tablet 500 mg  Status:  Discontinued        500 mg Oral Every 8 hours 06/10/20 0406 06/10/20 0948   06/10/20 0415  aztreonam (AZACTAM) 2 g in sodium chloride 0.9 % 100 mL IVPB        2 g 200 mL/hr over 30 Minutes Intravenous  Once 06/10/20 0406 06/10/20 0546   06/10/20 0415  vancomycin (VANCOCIN) IVPB 1000 mg/200 mL premix  Status:  Discontinued        1,000 mg 200 mL/hr over 60 Minutes Intravenous  Once 06/10/20 0406 06/10/20 0412   06/10/20 0415  vancomycin (VANCOCIN) 2,500 mg in sodium chloride 0.9 % 500 mL IVPB        2,500 mg 250 mL/hr over 120 Minutes Intravenous  Once 06/10/20 0412 06/10/20 2376       Time spent: 60 minutes    Junious Silk ANP  [facility] 7 am - 330 pm/M-F for direct patient care and secure chat Please refer to Amion for contact info 50  days

## 2020-07-30 NOTE — TOC Progression Note (Signed)
Transition of Care [facility]) - Progression Note    Patient Details  Name: Carl Hill MRN: [id number] Date of Birth: [birth year]  Transition of Care [facility]) CM/SW [place], RN Phone Number: 07/30/2020, 12:25 PM  Clinical Narrative:    CM met with the patient at the bedside this morning in regards to transitions of care.  The patient was sitting on the side of the bariatric bed this morning with OT for therapy and remains on room air at this time.  The patient continues to have pain in buttocks and lower extremities at this time but blood pressure and Oxygen saturation is stable and improved as compared to last week.  The patient will be evaluated for CIR bed once he has progressed clinically for admission.  The patient and mother are aware of the plan and are agreeable for CIR admission.  CM and MSW will continue to follow the patient for admission to CIR.   Expected Discharge Plan: [place] (CIR versus SNF placement) Barriers to Discharge: Continued Medical Work up,SNF Pending bed offer (Bariatric bed need - weight 545 lbs)  Expected Discharge Plan and Services Expected Discharge Plan: [place] (CIR versus SNF placement) In-house Referral: Clinical Social Work,Nutrition Discharge Planning Services: CM Consult Post Acute Care Choice: [place] Living arrangements for the past 2 months: Single Family Home                                       Social Determinants of Health (SDOH) Interventions    Readmission Risk Interventions Readmission Risk Prevention Plan 07/26/2020  Transportation Screening Complete  PCP or Specialist Appt within 3-5 Days Complete  HRI or [place] Complete  Social Work Consult for [place] Planning/Counseling Complete  Palliative Care Screening Complete  Medication Review [employer] photographer) Complete  Some recent data might be hidden

## 2020-07-31 DIAGNOSIS — N493 Fournier gangrene: Secondary | ICD-10-CM | POA: Diagnosis not present

## 2020-07-31 LAB — GLUCOSE, CAPILLARY
Glucose-Capillary: 147 mg/dL — ABNORMAL HIGH (ref 70–99)
Glucose-Capillary: 130 mg/dL — ABNORMAL HIGH (ref 70–99)
Glucose-Capillary: 170 mg/dL — ABNORMAL HIGH (ref 70–99)
Glucose-Capillary: 129 mg/dL — ABNORMAL HIGH (ref 70–99)
Glucose-Capillary: 119 mg/dL — ABNORMAL HIGH (ref 70–99)

## 2020-07-31 NOTE — Progress Notes (Addendum)
[facility] PROGRESS NOTE  Carl Hill QQI:[id number] DOB: 12/27/1970 DOA: 06/10/2020 PCP: Malka So., MD  Status: Remains inpatient appropriate because:Altered mental status, Unsafe d/c plan and Inpatient level of care appropriate due to severity of illness   Dispo:               The patient is from: Home              Anticipated d/c is to: CIR-as of 2/28 therapy session pt improving enough to likely be reconsidered for CIR. Insurance authorization pending and it appears patient is appropriate for CIR admission.  Coordinator to speak to patient's mother on 3/3 and likely bed available next week              Patient currently is medically stable to d/c.  Patient had recent issues 2/2 intermittent orthostatic hypotension with efforts to stand utilizing specialty bed but as of 2/28 improved. Able to sit on EOB x 10 minutes on 2/28.  In addition patient has a very large deep combine decubitus involving the buttocks, sacrum, coccyx as well as wounds on both lower extremities.  These wounds occurred in the context of necrotizing soft tissue injury/fasciitis.  Patient is morbidly obese greater than 500 pounds and requires multiple providers for repositioning and providing wound care twice daily.              Barriers to DC: Morbid obesity, need for aggressive wound care, continued issues with intermittent orthostatic hypotension with activity and physical therapy.  Is very motivated to improve and return to the home environment   Difficult to place patient Yes   Level of care: Telemetry Medical  Code Status: Full Family Communication:  DVT prophylaxis: Warfarin Vaccination status: Fully vaccinated as of June 2021/Moderna; discussed with patient and on 2/25 Moderna booster shot given  Foley catheter: 16 Fr Foley placed 2/62  HPI: 50 year old male with history of morbid obesity, hypertension, diabetes with peripheral lower extremity neuropathy, atrial fibrillation.  He initially  presented to the ER on 1/10 with reports of hyperglycemia with glucose greater than 406.  He was symptomatic with dizziness lightheadedness and headache.  He reports chronic lower extremity wounds on the left heel the recent left calf skin tear.  In route to the hospital he sustained a skin tear of his right calf.  His mother had also noticed a boil on his buttocks on Thursday.  Patient at baseline is ambulatory with a cane but due to current illness mother has been assisting with his care including pericare.  On exam in the ER patient was found to have what appeared to be significant necrotizing soft tissue infection/injury to the buttocks, perineum and upper thigh regions.  Surgery was urgently consulted and patient was taken to the OR.  See below regarding other significant events.  Since that time patient has transitioned out of the ICU initially to the progressive care unit and as of the past 24 hours to 6 N. surgical unit.  Patient had been stable on a trach collar and accidentally decannulated himself this morning.  PCCM was notified and recommended leaving the trach out and patient has done well since that time.  Patient had been undergoing aggressive wound care to include hydrotherapy with significant improvement in the wound base and resolution of necrotic and other debris.  He is now tolerating saline packing wound dressings to all of his wounds twice daily.  He continues to have significant pain in the decubitus area as  well as worsening neuropathy pain in both feet as PT has worked with him and he is starting to do some partial weightbearing.  Patient very talkative and animated today on 2/25 and is very eager to continue therapy and eventually return home.  Significant Hospital events: 1/10 Admitted to [facility] with sepsis, necrotizing fasciitis. General surgery consulted and debridement pursued. In the afternoon, patient developed hypotension with AF in RVR. Synchronized cardioversion unsuccessful  with conversion to VT. CPR initated with ROSC after desynchronized defibrillation. 1/11 Debridement 1/18 Mucus plugging 1/19 Bronchoscopy >> mucus plug on Rt, airway collapse with exhalation 1/20 Persistent fever, change ABx 1/24 Increased vent requirements, habitus related +/- mucus plugging 1/25 Hypotensive to SBP 80s, Levo resumed, minimally responsive overnight off sedation, CT Head negative 1/26 Stable vent/pressor requirements, bronched, tracheostomy 1/29 start hydrotherapy 1/30 start lovenox/coumadin 2/2 Tolerating PS 2/4 did TC for 12 hours 2/5 Did 24 hours TC 2/6 Continue TC  2/8 downgrade to progressive 2/15 trach changed to # 6 cuffless  2/25 Self decannulated-trach left out   Subjective: Patient awake and alert and preparing to work with therapy.  States constipation and abdominal cramping symptoms have improved after repositioning of Flexi-Seal yesterday and change over to scheduled MiraLAX at bedtime.  Also states overall pain markedly improved with slight increase in OxyContin on 3/1.  Objective: Vitals:   07/30/20 2052 07/31/20 0605  BP: (!) 123/58 136/77  Pulse: (!) 105 (!) 102  Resp: 17 19  Temp: 98.2 F (36.8 C) (!) 97.4 F (36.3 C)  SpO2: 97% 92%    Intake/Output Summary (Last 24 hours) at 07/31/2020 8295 Last data filed at 07/31/2020 0145 Gross per 24 hour  Intake 3400 ml  Output 3050 ml  Net 350 ml   Filed Weights   07/26/20 0500 07/27/20 0500 07/28/20 0500  Weight: (!) 247.6 kg (!) 246.8 kg (!) 246.8 kg    Exam:  Constitutional: Alert and stable, calm no acute distress Neck: Recent tracheostomy site with granular tissue and no air leakage Respiratory: Bilateral lung sounds are clear to auscultation, decreased in the bases, stable on room air Cardiovascular: Normal heart sounds, pulse regular, extremities warm to touch, no definitive peripheral edema Abdomen: Flexi-Seal tube in place to manage stools to prevent contamination of wound Skin:  Multiple wounds involving the posterior surface of the legs as well as upper posterior thighs, buttocks, perineum and sacrum.  Please see pictures in chart. Neurologic: CN 2-12 grossly intact. Sensation intact, DTR normal. Strength 4/5 x all 4 extremities.   Psychiatric: Alert and awake, oriented x3, pleasant jocular affect.   Assessment/Plan: Acute problems: Acute on Chronic Hypoxic and Hypercapnic Respiratory Failure in the setting of Sepsis, Morbid obesity, healthcare associated pneumonia, recurrent mucous plugging; -Sepsis physiology has resolved and PNA adequately treated -Prolonged respiratory failure requiring mechanical ventilation, failed attempt at extubation. -Status post tracheostomy on 1/26, wean off ventilator.  With subsequent downgrade to cuffless #6 trach on 2/15.  As of 2/25 patient accidentally self decannulated and decision made to leave tracheostomy tube out -Continue Vaseline gauze dressing covered by dry dressing over trach site until site closed -Once trach site closed patient will likely benefit from CPAP to use at bedtime and as needed given likelihood of underlying sleep apnea  Sepsis 2/2 Left buttock necrotizing fasciitis with E. coli, group B strep, Peptostreptococcus in wound culture: SEPSIS RESOLVED -Excisional debridement of perianal, gluteal and perineal necrotizing soft tissue infection by Dr. Freida Busman 1/10 -Back to OR 1/11 and excisional debridement of perineal  necrosis, by Dr. Freida Busman. -Received hydrotherapy from 06/29/2020 until 07/04/2020. -No further debridement.  General surgery recommended wound care RN follow -Seen by infectious disease who recommended Unasyn followed by Augmentin for 7-month course due to actinomyces in the wound.  Patient currently on Augmentin. -As of 2/25 CRP remains elevated at 4.6, sed rate 138 -Continue with wound care and dressing changes. 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 Fib/acquired thrombophilia: VR Controlled.   Continue with amiodarone and diltiazem. Preadmission Xarelto discontinued per pharmacy recommendation given patient's body weight greater than 500 pounds Continue with Coumadin-INR therapeutic at 2.5  Physical deconditioning Markedly improved now sitting on edge of bed for the first time in greater than 43 days CIR admission pending insurance Auth and bed availability Continue PT and OT  Diarrhea:  Has rectal tube to protect the wound. Patient reporting abdominal cramping regularly with use of laxatives for have discontinued Senokot and have changed MiraLAX to scheduled at bedtime Patient does have issues sometimes with stool becoming dry and hard and benefits from flushing of Flexi-Seal so order placed on 2/25  Pressure injury present on admission in context necrotizing skin infection/injury (Coccyx/sacrum/buttocks/perinuem/pretibial See documentation from wound care nurse Incision (Closed) 06/10/20 Coccyx Other (Comment) (Active)  Date First Assessed/Time First Assessed: 06/10/20 1250   Location: Coccyx  Location  Orientation: Other (Comment)    Assessments 06/10/2020  2:30 PM 07/31/2020  8:00 AM  Dressing Type Gauze (Comment) --  Dressing Change Frequency -- Twice a day  Site / Wound Assessment -- Dressing in place / Unable to assess     No Linked orders to display     Wound / Incision (Open or Dehisced) 06/10/20 Skin tear Pretibial Right;Anterior (Active)  Date First Assessed/Time First Assessed: 06/10/20 1400   Wound Type: Skin tear  Location: Pretibial  Location Orientation: Right;Anterior  Present on Admission: Yes    Assessments 06/10/2020  2:30 PM 07/31/2020  8:00 AM  Dressing Type Petroleum --  Dressing Changed Changed --  Dressing Status Clean;Dry;Intact --  Dressing Change Frequency -- Twice a day     No Linked orders to display     Pressure Injury 06/10/20 Heel Right Stage 2 -  Partial thickness loss of dermis presenting as a shallow open injury with a red, pink wound bed without slough. (Active)  Date First Assessed/Time First Assessed:  06/10/20 1400   Location: Heel  Location Orientation: Right  Staging: Stage 2 -  Partial thickness loss of dermis presenting as a shallow open injury with a red, pink wound bed without slough.  Present on Admis...    Assessments 06/10/2020  2:30 PM 07/31/2020  8:00 AM  Dressing Type Foam - Lift dressing to assess site every shift None  Dressing Clean;Dry;Intact --  Treatment -- Cleansed     No Linked orders to display     Incision (Closed) 06/11/20 Perineum (Active)  Date First Assessed/Time First Assessed: 06/11/20 1525   Location: Perineum    Assessments 06/11/2020  8:00 PM 07/31/2020  8:00 AM  Dressing Type Gauze (Comment);Tape dressing --  Dressing Intact --  Dressing Change Frequency Daily Twice a day  Site / Wound Assessment -- Dressing in place / Unable to assess     No Linked orders to display     Wound / Incision (Open or Dehisced) 06/10/20 Skin tear Pretibial Left (Active)  Date First Assessed/Time First Assessed: 06/10/20 1200   Wound Type:  Skin tear  Location: Pretibial  Location Orientation: Left  Present on Admission: Yes    Assessments 06/11/2020  8:00 PM 07/29/2020  9:45 AM  Dressing Type Impregnated gauze (petrolatum);Abdominal pads;Gauze (Comment) None  Dressing Changed -- Changed  Dressing Status -- Clean;Dry;Intact  Dressing Change Frequency Twice a day Twice a day  Site / Wound Assessment -- Red;Yellow;Bleeding     No Linked orders to display     Wound / Incision (Open or Dehisced) 06/29/20 Buttocks (Active)  Date First Assessed/Time First Assessed: 06/29/20 1148   Location: Buttocks    Assessments 06/29/2020  2:53 PM 07/31/2020  8:00 AM  Wound Image     Dressing Type Abdominal pads;Barrier Film (skin prep);Gauze (Comment);Moist to moist --  Dressing Changed Changed --  Dressing Status Dry;Clean;Intact --  Dressing Change Frequency Daily Twice a day  Site / Wound Assessment Bleeding;Black;Red;Yellow --  % Wound base Red or Granulating 60% --  % Wound base Yellow/Fibrinous Exudate 30% --  % Wound base Black/Eschar 10% --  % Wound base Other/Granulation Tissue (Comment) 0% --  Peri-wound Assessment Intact --  Wound Length (cm) 25 cm --  Wound Width (cm) 11 cm --  Wound Depth (cm) 10 cm --  Wound Volume (cm^3) 2750 cm^3 --  Wound Surface Area (cm^2) 275 cm^2 --  Margins Unattached edges (unapproximated) --  Drainage Amount Copious --  Drainage Description Serosanguineous --  Treatment Debridement (Selective);Hydrotherapy (Pulse lavage);Packing (Saline gauze) --     No Linked orders to display   Nutrition Status: Nutrition Problem: Increased nutrient needs Etiology: wound healing Signs/Symptoms: estimated needs Interventions: Ensure Enlive (each supplement provides 350kcal and 20 grams of protein),MVI,Premier Protein,Magic cup Estimated body mass index is 78.07 kg/m as calculated from the following:   Height as of this encounter: 5\' 10"  (1.778 m).   Weight as of this encounter: 246.8 kg.  Continue Ensure  Plus, multiple vitamins  Other problems: hypernatremia  from diuresis and insensible losses: Resolved  Acute blood loss anemia  from blood loss from wound:  Status post blood transfusion. Hb  stable  Hypothyroidism:  Continue with Synthroid  Edema: Continue with Lipitor Intermittent blurry vision:  Could be related to hypotension versus narcotics,  Also happens at home.    Data Reviewed: Basic Metabolic Panel: Recent Labs  Lab 07/25/20 0815 07/27/20 0814 07/29/20 0036  NA 135 134* 134*  K 3.7 4.0 3.4*  CL 94* 97*  95*  CO2 30 23 26   GLUCOSE 125* 183* 141*  BUN 8 8 10   CREATININE 1.02 0.99 0.85  CALCIUM 8.4* 8.2* 8.3*   Liver Function Tests: No results for input(s): AST, ALT, ALKPHOS, BILITOT, PROT, ALBUMIN in the last 168 hours. No results for input(s): LIPASE, AMYLASE in the last 168 hours. No results for input(s): AMMONIA in the last 168 hours. CBC: Recent Labs  Lab 07/25/20 0439 07/29/20 0036  WBC 7.2 6.2  HGB 9.0* 8.9*  HCT 29.5* 28.3*  MCV 88.1 86.8  PLT 290 309   Cardiac Enzymes: No results for input(s): CKTOTAL, CKMB, CKMBINDEX, TROPONINI in the last 168 hours. BNP (last 3 results) No results for input(s): BNP in the last 8760 hours.  ProBNP (last 3 results) No results for input(s): PROBNP in the last 8760 hours.  CBG: Recent Labs  Lab 07/30/20 1126 07/30/20 1647 07/30/20 2125 07/31/20 0608 07/31/20 0730  GLUCAP 125* 195* 130* 147* 130*    No results found for this or any previous visit (from the past 240 hour(s)).   Studies: No results found.  Scheduled Meds: . amiodarone  200 mg Oral Daily  . amoxicillin-clavulanate  1 tablet Oral Q8H  . atorvastatin  20 mg Oral QPM  . chlorhexidine  15 mL Mouth Rinse BID  . Chlorhexidine Gluconate Cloth  6 each Topical Daily  . diltiazem  240 mg Oral Daily  . feeding supplement  237 mL Oral BID BM  . insulin aspart  0-20 Units Subcutaneous TID WC  . insulin aspart  0-5 Units Subcutaneous QHS   . insulin aspart  18 Units Subcutaneous TID WC  . insulin detemir  42 Units Subcutaneous BID  . levothyroxine  75 mcg Oral Daily  . mouth rinse  15 mL Mouth Rinse q12n4p  . multivitamin with minerals  1 tablet Oral Daily  . nortriptyline  50 mg Oral BID  . oxyCODONE  20 mg Oral Q12H  . pantoprazole  40 mg Oral Daily  . polyethylene glycol  17 g Oral QHS  . pregabalin  300 mg Oral BID  . Ensure Max Protein  11 oz Oral QHS  . sodium chloride flush  3 mL Intravenous Q12H  . warfarin  3 mg Oral q1600  . Warfarin - Pharmacist Dosing Inpatient   Does not apply q1600   Continuous Infusions: . sodium chloride Stopped (06/28/20 2035)    Principal Problem:   Fournier gangrene Active Problems:   Sepsis (HCC)   DKA (diabetic ketoacidosis) (HCC)   AKI (acute kidney injury) (HCC)   Diabetic ulcer of heel (HCC)   Atrial fibrillation, chronic (HCC)   Essential hypertension   Dyslipidemia   Acquired hypothyroidism   Chronic pain disorder   Morbid obesity with BMI of 60.0-69.9, adult (HCC)   Acute respiratory failure (HCC)   Sepsis due to Streptococcus, group B (HCC)   Actinomycosis   Consultants:  General surgery  PCCM  Nephrology  Infectious disease  Procedures:  Irrigation and debridement of abscesses on 1/10 and 1/11  Antibiotics: Anti-infectives (From admission, onward)   Start     Dose/Rate Route Frequency Ordered Stop   07/12/20 1400  amoxicillin-clavulanate (AUGMENTIN) 875-125 MG per tablet 1 tablet        1 tablet Oral Every 8 hours 07/12/20 1003     06/27/20 1400  amoxicillin-clavulanate (AUGMENTIN) 875-125 MG per tablet 1 tablet  Status:  Discontinued        1 tablet Per Tube Every 8 hours 06/27/20  6712 07/12/20 1003   06/24/20 1615  Ampicillin-Sulbactam (UNASYN) 3 g in sodium chloride 0.9 % 100 mL IVPB  Status:  Discontinued        3 g 200 mL/hr over 30 Minutes Intravenous Every 6 hours 06/24/20 1517 06/27/20 0939   06/24/20 1300  amoxicillin (AMOXIL) capsule  500 mg        500 mg Oral  Once 06/24/20 1241 06/24/20 1256   06/24/20 1115  amoxicillin (AMOXIL) capsule 500 mg  Status:  Discontinued        500 mg Oral  Once 06/24/20 1025 06/24/20 1241   06/22/20 1415  meropenem (MERREM) 2 g in sodium chloride 0.9 % 100 mL IVPB  Status:  Discontinued        2 g 200 mL/hr over 30 Minutes Intravenous Every 8 hours 06/22/20 1324 06/24/20 1517   06/22/20 1400  meropenem (MERREM) 1 g in sodium chloride 0.9 % 100 mL IVPB  Status:  Discontinued        1 g 200 mL/hr over 30 Minutes Intravenous Every 8 hours 06/22/20 1248 06/22/20 1324   06/22/20 1300  doxycycline (VIBRAMYCIN) 100 mg in sodium chloride 0.9 % 250 mL IVPB  Status:  Discontinued        100 mg 125 mL/hr over 120 Minutes Intravenous Every 12 hours 06/22/20 1212 06/22/20 1248   06/20/20 1100  vancomycin (VANCOCIN) 2,500 mg in sodium chloride 0.9 % 500 mL IVPB  Status:  Discontinued        2,500 mg 250 mL/hr over 120 Minutes Intravenous Every 12 hours 06/20/20 1007 06/22/20 0813   06/20/20 1100  ceFEPIme (MAXIPIME) 2 g in sodium chloride 0.9 % 100 mL IVPB  Status:  Discontinued        2 g 200 mL/hr over 30 Minutes Intravenous Every 8 hours 06/20/20 1007 06/22/20 1207   06/17/20 1130  metroNIDAZOLE (FLAGYL) IVPB 500 mg  Status:  Discontinued        500 mg 100 mL/hr over 60 Minutes Intravenous Every 8 hours 06/17/20 1041 06/22/20 1248   06/13/20 1400  cefTRIAXone (ROCEPHIN) 2 g in sodium chloride 0.9 % 100 mL IVPB  Status:  Discontinued        2 g 200 mL/hr over 30 Minutes Intravenous Every 24 hours 06/13/20 1242 06/20/20 0944   06/11/20 1300  vancomycin (VANCOREADY) IVPB 2000 mg/400 mL        2,000 mg 200 mL/hr over 120 Minutes Intravenous  Once 06/11/20 1212 06/11/20 1824   06/10/20 2000  clindamycin (CLEOCIN) IVPB 900 mg        900 mg 100 mL/hr over 30 Minutes Intravenous Every 8 hours 06/10/20 1307 06/13/20 1500   06/10/20 1600  ceFEPIme (MAXIPIME) 2 g in sodium chloride 0.9 % 100 mL IVPB   Status:  Discontinued        2 g 200 mL/hr over 30 Minutes Intravenous Every 12 hours 06/10/20 1504 06/13/20 1242   06/10/20 1400  metroNIDAZOLE (FLAGYL) IVPB 500 mg  Status:  Discontinued        500 mg 100 mL/hr over 60 Minutes Intravenous Every 8 hours 06/10/20 1023 06/10/20 1308   06/10/20 1300  aztreonam (AZACTAM) 2 g in sodium chloride 0.9 % 100 mL IVPB  Status:  Discontinued        2 g 200 mL/hr over 30 Minutes Intravenous Every 8 hours 06/10/20 1033 06/10/20 1504   06/10/20 1215  clindamycin (CLEOCIN) IVPB 900 mg  900 mg 100 mL/hr over 30 Minutes Intravenous To Surgery 06/10/20 1210 06/10/20 1210   06/10/20 1033  vancomycin variable dose per unstable renal function (pharmacist dosing)  Status:  Discontinued         Does not apply See admin instructions 06/10/20 1033 06/12/20 1252   06/10/20 0600  metroNIDAZOLE (FLAGYL) tablet 500 mg  Status:  Discontinued        500 mg Oral Every 8 hours 06/10/20 0406 06/10/20 0948   06/10/20 0415  aztreonam (AZACTAM) 2 g in sodium chloride 0.9 % 100 mL IVPB        2 g 200 mL/hr over 30 Minutes Intravenous  Once 06/10/20 0406 06/10/20 0546   06/10/20 0415  vancomycin (VANCOCIN) IVPB 1000 mg/200 mL premix  Status:  Discontinued        1,000 mg 200 mL/hr over 60 Minutes Intravenous  Once 06/10/20 0406 06/10/20 0412   06/10/20 0415  vancomycin (VANCOCIN) 2,500 mg in sodium chloride 0.9 % 500 mL IVPB        2,500 mg 250 mL/hr over 120 Minutes Intravenous  Once 06/10/20 0412 06/10/20 0102       Time spent: 60 minutes    Junious Silk ANP  [facility] 7 am - 330 pm/M-F for direct patient care and secure chat Please refer to Amion for contact info 51  days

## 2020-07-31 NOTE — Progress Notes (Signed)
Physical Therapy Treatment Patient Details Name: Carl Hill MRN: [id number] DOB: 05-05-1971 Today's Date: 07/31/2020    History of Present Illness Pt is a 50 year old male who presented with buttock pain, fatigue and malaise.  Found to have hyperglycemia. CT pelvis demonstrated necrotizing fasciitis in L buttock, L upper thigh, and perineum. Taken to OR for debridement 1/10 c/b Afib with RVR and VT arrest s/p CPR/defib. Trached 1/26 PMHx: HTN, obesity, DM, AFib.  As of 07/04/20 started TC trials.  Accidental self decannulated 07/26/20 and left out.    PT Comments    Pt progressing steadily every session.  He was able to stand with 2 person assist (third used to stabilize recliner that he was using for support once standing) twice at EOB.  He was able to get EOB with supervision and use of the bed rails.  He sat for ~20 mins and has been doing well self-pre medicating for his daily therapy sessions.  He remains highly appropriate for CIR level therapies.  Communicated end of week schedule/plan with OT.  PT to come back tomorrow at 8 am. PT will continue to follow acutely for safe mobility progression   Follow Up Recommendations  CIR     Equipment Recommendations  Wheelchair (measurements PT);Wheelchair cushion (measurements PT);Hospital bed;Other (comment);3in1 (PT) (all bariatric equipment)    Recommendations for Other Services Rehab consult     Precautions / Restrictions Precautions Precautions: Fall Precaution Comments: rectal tube, foley catheter, large buttocks wound, hydro on hold    Mobility  Bed Mobility Overal bed mobility: Needs Assistance Bed Mobility: Rolling;Supine to Sit;Sit to Supine Rolling: Modified independent (Device/Increase time)   Supine to sit: Supervision Sit to supine: Min guard   General bed mobility comments: Pt able to roll safely bil with use of railings.  Pt also demonstrated the ability, given extra time to come up to sitting EOB from side lying  with supervision, HOB elevated and heavy use of bed rail.  Returning to supine he almost got both legs back up into the bed, with just a little help to ensure legs and foot plate made contact.    Transfers Overall transfer level: Needs assistance Equipment used: 2 person hand held assist (recliner turned backwards, break on, 3rd person stabilizing.) Transfers: Sit to/from Stand Sit to Stand: Mod assist;+2 physical assistance;From elevated surface         General transfer comment: two person mod assist to come to standing from elevated bed bil knees blocked, support from sheet used as gait belt.  Pt had more success standing first, locking out bil knees with assist against bed, and then reaching for back of recliner for support.  R knee buckled a bit on second stand and has been generally the weaker leg during his hospitalization.  Ambulation/Gait                 Stairs             Wheelchair Mobility    Modified Rankin (Stroke Patients Only)       Balance Overall balance assessment: Needs assistance Sitting-balance support: Feet supported;Bilateral upper extremity supported;No upper extremity supported Sitting balance-Leahy Scale: Good Sitting balance - Comments: supervision EOB.  EOB time was >20 mins in prep for standing and for multiple attempts.   Standing balance support: Bilateral upper extremity supported Standing balance-Leahy Scale: Poor Standing balance comment: two person mod assist to get all the way up to standing with third person used to stabilize the recliner  chair.                            Cognition Arousal/Alertness: Awake/alert Behavior During Therapy: WFL for tasks assessed/performed Overall Cognitive Status: Within Functional Limits for tasks assessed                                        Exercises      General Comments General comments (skin integrity, edema, etc.): HR in the 120s-130s during mobility.   O2 sats mid 90s on RA. BP not checked as he did not show signs of lightheadedness or dizziness in sitting or standing.      Pertinent Vitals/Pain Pain Assessment: Faces Faces Pain Scale: Hurts whole lot Pain Location: buttocks low back Pain Descriptors / Indicators: Grimacing;Guarding Pain Intervention(s): Limited activity within patient's tolerance;Monitored during session;Repositioned;Premedicated before session    Home Living                      Prior Function            PT Goals (current goals can now be found in the care plan section) Acute Rehab PT Goals Patient Stated Goal: to be able to stand and take care of himself Progress towards PT goals: Progressing toward goals    Frequency    Min 3X/week      PT Plan Current plan remains appropriate    Co-evaluation              AM-PAC PT "6 Clicks" Mobility   Outcome Measure  Help needed turning from your back to your side while in a flat bed without using bedrails?: A Little Help needed moving from lying on your back to sitting on the side of a flat bed without using bedrails?: A Lot Help needed moving to and from a bed to a chair (including a wheelchair)?: Total Help needed standing up from a chair using your arms (e.g., wheelchair or bedside chair)?: A Lot Help needed to walk in hospital room?: Total Help needed climbing 3-5 steps with a railing? : Total 6 Click Score: 10    End of Session   Activity Tolerance: Patient limited by fatigue;Patient limited by pain Patient left: in bed;with call bell/phone within reach   PT Visit Diagnosis: Muscle weakness (generalized) (M62.81);Other abnormalities of gait and mobility (R26.89)     Time: [phone] (no charge for extra time spent talking) PT Time Calculation (min) (ACUTE ONLY): 70 min  Charges:  $Therapeutic Activity: 53-67 mins                     Corinna Capra, PT, DPT  Acute Rehabilitation [phone] pager [phone]  office

## 2020-07-31 NOTE — PMR Pre-admission (Signed)
PMR Admission Coordinator Pre-Admission Assessment  Patient: Carl Hill is an 50 y.o., male MRN: [id number] DOB: 27-May-1971 Height: 5' 10" (177.8 cm) Weight: (!) 248.2 kg  Insurance Information HMO:    PPO:      PCP:      IPA:      80/20:      OTHER:  PRIMARY: Medicaid [place] Access       Policy#: [id number]      Subscriber: Pt.  CM Name:    Phone#:      Fax#:  Pre-Cert#:       Employer:  Benefits:  Phone #:      Name:  Eff. Date:      Deduct:       Out of Pocket Max:       Life Max:  CIR:       SNF:  Outpatient:      Co-Pay:  Home Health:       Co-Pay:  DME:      Co-Pay:  Providers:  SECONDARY:       Policy#:      Phone#:   Development worker, community:       Phone#:   The Engineer, [employer]" for patients in Inpatient Rehabilitation Facilities with attached "Privacy Act [place] Records" was provided and verbally reviewed with: Patient  Emergency Contact Information Contact Information    Name Relation Home Work [address]   [place], [place] Mother   [phone]   TRAVANTI, MCMANUS  [phone]        Current Medical History  Patient Admitting Diagnosis: Debility 2/2 necrotizing fasciitis  History of Present Illness: Pt is a 50 year old male with PMH of PMHx: HTN, obesity, DM, AFib.  As of 07/04/20 started TC trials.  Accidental self decannulated 07/26/20 and left out.who presented with buttock pain, fatigue and malaise.  Found to have hyperglycemia. CT pelvis demonstrated necrotizing fasciitis in L buttock, L upper thigh, and perineum. Taken to OR for debridement 1/10 c/b Afib with RVR and VT arrest s/p CPR/defib. Trached 1/26, self-decannulated 2/25. CIR was consulted to assist in return to  [facility]    Patient's medical record from [name]  has been reviewed by the rehabilitation admission coordinator and physician.  Past Medical History  Past Medical History:  Diagnosis Date  . 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 Asthma   . Atrial fibrillation ([place])   . Diabetes  mellitus without complication ([place])   . History of cardioversion    3 times   . Hypertension   . Sepsis due to Streptococcus, group B ([place]) 06/22/2020    Family History   family history is not on file.  Prior Rehab/Hospitalizations Has the patient had prior rehab or hospitalizations prior to admission? Yes  Has the patient had major surgery during 100 days prior to admission? Yes   Current Medications  Current Facility-Administered Medications:  .  0.9 %  sodium chloride infusion, , Intravenous, PRN, Hunsucker, Bonna Gains, MD, Stopped at 06/28/20 2035 .  acetaminophen (TYLENOL) tablet 650 mg, 650 mg, Oral, Q6H PRN, 650 mg at 08/01/20 2006 **OR** acetaminophen (TYLENOL) 160 MG/5ML solution 650 mg, 650 mg, Oral, Q6H PRN, Opyd, Ilene Qua, MD, 650 mg at 07/30/20 2039 .  albuterol (PROVENTIL) (2.5 MG/3ML) 0.083% nebulizer solution 2.5 mg, 2.5 mg, Nebulization, Q4H PRN, Hunsucker, Bonna Gains, MD, 2.5 mg at 07/05/20 1514 .  amiodarone (PACERONE) tablet 200 mg, 200 mg, Oral, Daily, Hosie Poisson, MD, 200 mg at 08/05/20 0901 .  amoxicillin-clavulanate (AUGMENTIN) 875-125 MG  per tablet 1 tablet, 1 tablet, Oral, Q8H, Hosie Poisson, MD, 1 tablet at 08/05/20 0524 .  atorvastatin (LIPITOR) tablet 20 mg, 20 mg, Oral, QPM, Hosie Poisson, MD, 20 mg at 08/04/20 1932 .  chlorhexidine (PERIDEX) 0.12 % solution 15 mL, 15 mL, Mouth Rinse, BID, Hunsucker, Bonna Gains, MD, 15 mL at 08/05/20 0901 .  Chlorhexidine Gluconate Cloth 2 % PADS 6 each, 6 each, Topical, Daily, Kipp Brood, MD, 6 each at 08/05/20 1031 .  diltiazem (CARDIZEM CD) 24 hr capsule 240 mg, 240 mg, Oral, Daily, Domenic Polite, MD, 240 mg at 08/05/20 0901 .  docusate sodium (COLACE) capsule 100 mg, 100 mg, Oral, BID, Erin Hearing L, NP, 100 mg at 08/05/20 0900 .  feeding supplement (ENSURE ENLIVE / ENSURE PLUS) liquid 237 mL, 237 mL, Oral, BID BM, Hosie Poisson, MD, 237 mL at 08/05/20 0901 .  HYDROmorphone (DILAUDID) injection 0.5-0.75 mg,  0.5-0.75 mg, Intravenous, TID PRN, Wynetta Emery, Clanford L, MD, 0.75 mg at 08/05/20 0430 .  insulin aspart (novoLOG) injection 0-20 Units, 0-20 Units, Subcutaneous, TID WC, Opyd, Ilene Qua, MD, 4 Units at 08/05/20 0859 .  insulin aspart (novoLOG) injection 0-5 Units, 0-5 Units, Subcutaneous, QHS, Opyd, Ilene Qua, MD, 2 Units at 07/14/20 2136 .  insulin aspart (novoLOG) injection 18 Units, 18 Units, Subcutaneous, TID WC, Hosie Poisson, MD, 18 Units at 08/03/20 1812 .  insulin detemir (LEVEMIR) injection 42 Units, 42 Units, Subcutaneous, BID, Hunsucker, Bonna Gains, MD, 42 Units at 08/05/20 0859 .  lactulose (CHRONULAC) 10 GM/15ML solution 10 g, 10 g, Oral, Q4H PRN, Erin Hearing L, NP, 10 g at 08/04/20 1000 .  levothyroxine (SYNTHROID) tablet 75 mcg, 75 mcg, Oral, Daily, Hosie Poisson, MD, 75 mcg at 08/05/20 0523 .  MEDLINE mouth rinse, 15 mL, Mouth Rinse, q12n4p, Hunsucker, Bonna Gains, MD, 15 mL at 08/04/20 1600 .  melatonin tablet 3 mg, 3 mg, Oral, QHS PRN, Hosie Poisson, MD, 3 mg at 07/31/20 0123 .  multivitamin with minerals tablet 1 tablet, 1 tablet, Oral, Daily, Hosie Poisson, MD, 1 tablet at 08/05/20 0900 .  nortriptyline (PAMELOR) capsule 50 mg, 50 mg, Oral, BID, Erin Hearing L, NP, 50 mg at 08/05/20 0900 .  [DISCONTINUED] ondansetron (ZOFRAN) tablet 4 mg, 4 mg, Oral, Q6H PRN **OR** ondansetron (ZOFRAN) injection 4 mg, 4 mg, Intravenous, Q6H PRN, Karmen Bongo, MD, 4 mg at 08/04/20 1014 .  oxyCODONE (Oxy IR/ROXICODONE) immediate release tablet 10 mg, 10 mg, Oral, Q4H PRN, Johnson, Clanford L, MD, 10 mg at 08/04/20 1932 .  oxyCODONE (OXYCONTIN) 12 hr tablet 20 mg, 20 mg, Oral, Q12H, Erin Hearing L, NP, 20 mg at 08/05/20 0901 .  pantoprazole (PROTONIX) EC tablet 40 mg, 40 mg, Oral, Daily, Hosie Poisson, MD, 40 mg at 08/05/20 0900 .  [START ON 08/06/2020] polyethylene glycol (MIRALAX / GLYCOLAX) packet 17 g, 17 g, Oral, Daily, Johnson, Clanford L, MD .  pregabalin (LYRICA) capsule 300 mg, 300 mg,  Oral, BID, Hosie Poisson, MD, 300 mg at 08/05/20 0901 .  protein supplement (ENSURE MAX) liquid, 11 oz, Oral, QHS, Hosie Poisson, MD, 11 oz at 08/04/20 2205 .  saccharomyces boulardii (FLORASTOR) capsule 250 mg, 250 mg, Oral, BID, Johnson, Clanford L, MD, 250 mg at 08/05/20 0901 .  sodium chloride flush (NS) 0.9 % injection 3 mL, 3 mL, Intravenous, Q12H, Karmen Bongo, MD, 3 mL at 08/05/20 0902 .  warfarin (COUMADIN) tablet 2 mg, 2 mg, Oral, q1600, Rolla Flatten, [facility] .  Warfarin - Pharmacist Dosing Inpatient, ,  Does not apply, q1600, Chesley Mires, MD, Given at 08/03/20 1900  Patients Current Diet:  Diet Order            Diet Carb Modified Fluid consistency: Thin; Room service appropriate? Yes  Diet effective now                 Precautions / Restrictions Precautions Precautions: Fall Precaution Comments: rectal tube, foley catheter, large buttocks wound, hydro on hold Restrictions Weight Bearing Restrictions: No   Has the patient had 2 or more falls or a fall with injury in the past year? Yes  Prior Activity Level Limited Community (1-2x/wk): Went out for appts  Prior Functional Level Self Care: Did the patient need help bathing, dressing, using the toilet or eating? Independent  Indoor Mobility: Did the patient need assistance with walking from room to room (with or without device)? Independent  Stairs: Did the patient need assistance with internal or external stairs (with or without device)? Independent  Functional Cognition: Did the patient need help planning regular tasks such as shopping or remembering to take medications? Independent  Home Assistive Devices / Equipment Home Equipment: Walker - 2 wheels  Prior Device Use: Indicate devices/aids used by the patient prior to current illness, exacerbation or injury? Walker  Current Functional Level Cognition  Overall Cognitive Status: Within Functional Limits for tasks assessed Orientation Level: Oriented  X4 General Comments: Anxiety has decreased since building rapport with therapists.    Extremity Assessment (includes Sensation/Coordination)  Upper Extremity Assessment: Generalized weakness RUE Deficits / Details: AROM, WFLs, fair grip LUE Deficits / Details: AROM, WFLs, fair grip  Lower Extremity Assessment: Defer to PT evaluation    ADLs  Overall ADL's : Needs assistance/impaired Eating/Feeding: Modified independent Eating/Feeding Details (indicate cue type and reason): eating sidelying; using red tubing; difficulty opening soda lids; May benefit from lid opener Grooming: Set up,Sitting,Bed level Upper Body Bathing: Set up,Sitting Lower Body Bathing: Moderate assistance,Sitting/lateral leans,Bed level Upper Body Dressing : Set up,Supervision/safety,Bed level Lower Body Dressing: Total assistance Toilet Transfer: Total assistance,+2 for physical assistance,+2 for safety/equipment Toilet Transfer Details (indicate cue type and reason): staff advised to use maxi sky when recliner gets delivered Functional mobility during ADLs: Maximal assistance,+2 for physical assistance General ADL Comments: Pt states he does 10-90% of his bathing. discussed need to him to attempt to have his HOB in more of an upright position when bathing. Encouraged pt to work on rolling side- side to reach and wash his back. Pt verbalized understanding.    Mobility  Overal bed mobility: Needs Assistance Bed Mobility: Rolling,Sidelying to Sit,Sit to Sidelying Rolling: Modified independent (Device/Increase time) Sidelying to sit: Supervision Supine to sit: Supervision Sit to supine: Min guard Sit to sidelying: Mod assist,+2 for physical assistance General bed mobility comments: Pt able to roll supervision with use of bed rail, supervision to come up to sitting with HOB ~30 degrees and heavy use of rail, extra time needed to complete.  Mod assist of two people to help him get his feet back up on the bed and where he  can push against the foot board to move back towards [facility] (when we sit up he gets very far down to the end of the bed and it is difficult for him to flexh his legs and get/stabliize them on the foot board when he frist returns to side lying.  If he were in better position, I believe he could be supervision with this move as well.    Transfers  Overall transfer level: Needs assistance Equipment used: 2 person hand held assist (recliner chair locked and turned backwards towards the pt, third person to stabilize.) Transfers: Sit to/from Stand Sit to Stand: Mod assist,+2 physical assistance General transfer comment: two person mod assist (lighter mod assist today) to come to standing from elevated bed with bil knees blocked and sheet for gait belt.  Pt better able to power up with momentum "one two three" and lock out knees in standing.  On first stand even able to weight shift and get his feet up under himself.  Second and third stand were similiar.  Stood anywhere from 30-45 seconds with each stand today which was longer than yesterday.  Leaning on chair stabilized by third person for support once standing.    Ambulation / Gait / Stairs / Wheelchair Mobility  Ambulation/Gait General Gait Details: We talked about the next steps towards gait progression are weight shifting in standing and side stepping EOB before we come forward away from the bed.    Posture / Balance Dynamic Sitting Balance Sitting balance - Comments: supervision EOB.  EOB time was >20 mins in prep for standing and for multiple attempts.  We spoke about opportunities for him to sit EOB including during meds passing with RN in room, to eat his meals, etc.  to get more upright time. Balance Overall balance assessment: Needs assistance Sitting-balance support: Feet supported,Bilateral upper extremity supported,No upper extremity supported,Single extremity supported Sitting balance-Leahy Scale: Good Sitting balance - Comments: supervision  EOB.  EOB time was >20 mins in prep for standing and for multiple attempts.  We spoke about opportunities for him to sit EOB including during meds passing with RN in room, to eat his meals, etc.  to get more upright time. Standing balance support: Bilateral upper extremity supported Standing balance-Leahy Scale: Poor Standing balance comment: two person lighter mod assist in standing today, got down to min assist for breif moments (<10 seconds) once his knees were locked out.    Special needs/care consideration Skin: wounds on buttocks, thigh, perineum, abrasions on BUEs, Diabetic management: DM2 managed with sQ Novolog and Levemir and Special service needs Kreg Bed, Bariatric equipment,  Above weight limit for portable maximove, will need Sky lift. Pt. Currently has a foley, and will likely need flexiseal replaced.    Previous Home Environment (from acute therapy documentation) Living Arrangements: Parent  Lives With: Other (Comment) Available Help at Discharge: Family,Available 24 hours/day Type of Home: House Home Layout: Able to live on main level with bedroom/bathroom Home Access: Stairs to enter Entrance Stairs-Number of Steps: 6 (6 one entrance, 8 the other) Bathroom Shower/Tub: Chiropodist: Handicapped height Bathroom Accessibility: Yes How Accessible: Accessible via walker [place]: No Additional Comments: PT in room got more information from mother in room prior to [place] arrival.  Discharge Living Setting Plans for Discharge Living Setting: Patient's home Type of Home at Discharge: House Discharge Home Layout: One level Discharge Home Access: Stairs to enter Entrance Stairs-Rails: [place] reach both Entrance Stairs-Number of Steps: 6 Discharge Bathroom Shower/Tub: Tub/shower unit Discharge Bathroom Accessibility: Yes How Accessible: Accessible via walker Does the patient have any problems obtaining your medications?:  No  Social/Family/Support Systems Patient Roles: Parent Contact Information: [phone] Anticipated Caregiver: Barney Drain (mother) Anticipated Caregiver's Contact Information: [phone] Ability/Limitations of Caregiver: Can provide Min A Caregiver Availability: 24/7 Discharge Plan Discussed with Primary Caregiver: Yes Is Caregiver In Agreement with Plan?: No Does Caregiver/Family have Issues with Lodging/Transportation while Pt  is in Rehab?: Yes  Goals Patient/Family Goal for Rehab: PT/OT Min A Expected length of stay: 20-24 days Pt/Family Agrees to Admission and willing to participate: Yes Program Orientation Provided & Reviewed with Pt/Caregiver Including Roles  & Responsibilities: Yes  Decrease burden of Care through IP rehab admission: Specialzed equipment needs, Decrease number of caregivers, Bowel and bladder program and Patient/family education  Possible need for SNF placement upon discharge: not anticipated  Patient Condition: I have reviewed medical records from [facility], spoken with CM, and patient. I met with patient at the bedside for inpatient rehabilitation assessment.  Patient will benefit from ongoing PT and OT, can actively participate in 3 hours of therapy a day 5 days of the week, and can make measurable gains during the admission.  Patient will also benefit from the coordinated team approach during an Inpatient Acute Rehabilitation admission.  The patient will receive intensive therapy as well as Rehabilitation physician, nursing, social worker, and care management interventions.  Due to bladder management, bowel management, safety, skin/wound care, disease management, medication administration, pain management and patient education the patient requires 24 hour a day rehabilitation nursing.  The patient is currently mod+2- max A  with mobility and basic ADLs.  Discharge setting and therapy post discharge at home with home health is anticipated.   Patient has agreed to participate in the Acute Inpatient Rehabilitation Program and will admit today.  Preadmission Screen Completed By:  Genella Mech, 08/05/2020 12:21 PM ______________________________________________________________________   Discussed status with Dr. Ranell Patrick  on 08/05/20 at 50 and received approval for admission today.  Admission Coordinator:  Genella Mech, CCC-SLP, time 1400/Date 08/05/20  Assessment/Plan: Diagnosis: Debility secondary to DKA and necrotizing soft tissue infection 1. Does the need for close, 24 hr/day Medical supervision in concert with the patient's rehab needs make it unreasonable for this patient to be served in a less intensive setting? Yes 2. Co-Morbidities requiring supervision/potential complications:  1. Morbid obesity (BMI 78.51) 2. Sepsis 3. Necrotizing fasciitis 4. S/p trach 5. Type 2 DM 3. Due to bladder management, bowel management, safety, skin/wound care, disease management, medication administration, pain management and patient education, does the patient require 24 hr/day rehab nursing? Yes 4. Does the patient require coordinated care of a physician, rehab nurse, PT, OT to address physical and functional deficits in the context of the above medical diagnosis(es)? Yes Addressing deficits in the following areas: balance, endurance, locomotion, strength, transferring, bowel/bladder control, bathing, dressing, feeding, grooming, toileting and psychosocial support 5. Can the patient actively participate in an intensive therapy program of at least 3 hrs of therapy 5 days a week? Yes 6. The potential for patient to make measurable gains while on inpatient rehab is good 7. Anticipated functional outcomes upon discharge from inpatient rehab: mod assist PT, mod assist OT, independent SLP. Father will be able to provide modA.  8. Estimated rehab length of stay to reach the above functional goals is: 2-3 weeks 9. Anticipated discharge destination:  Home 10. Overall Rehab/Functional Prognosis: excellent and good   MD Signature: Leeroy Cha, MD

## 2020-07-31 NOTE — Progress Notes (Deleted)
ANTICOAGULATION CONSULT NOTE  Pharmacy Consult for Warfarin  Indication: atrial fibrillation  Patient Measurements: Height: 5\' 10"  (177.8 cm) Weight: (!) 246.8 kg (544 lb 1.5 oz) IBW/kg (Calculated) : 73 Heparin Dosing Weight: 149.7 kg  Vital Signs: Temp: 97.4 F (36.3 C) (03/02 0605) Temp Source: Oral (03/02 0605) BP: 136/77 (03/02 0605) Pulse Rate: 102 (03/02 0605)  Labs: Recent Labs    07/29/20 0036  HGB 8.9*  HCT 28.3*  PLT 309  LABPROT 27.3*  INR 2.6*  CREATININE 0.85    Estimated Creatinine Clearance: 214.2 mL/min (by C-G formula based on SCr of 0.85 mg/dL).   Assessment: 49YOM admitted with septic shock secondary to necrotizing fascitis. Was on Xarelto PTA, but that is not appropriate based on patient's weight. Pharmacy has been consulted to dose warfarin.  Patient sensitive to Warfarin in setting of drug interactions with Amiodarone and Augmentin, which are not new medications at this point. INR 2.6 therapeutic on 3mg  daily  Goal of Therapy:  INR 2-3 Monitor platelets by anticoagulation protocol: Yes  Plan:  Continue warfarin 3 mg daily F/u QMon/Thur INR and weekly CBC Monitor for s/sx of bleeding  07/31/20, PharmD, BCPS, BCCP Clinical Pharmacist  Please check AMION for all [facility] phone numbers After 10:00 PM, call Main Pharmacy [phone]

## 2020-08-01 DIAGNOSIS — N493 Fournier gangrene: Secondary | ICD-10-CM | POA: Diagnosis not present

## 2020-08-01 LAB — CBC
HCT: 30.5 % — ABNORMAL LOW (ref 39.0–52.0)
Hemoglobin: 9.2 g/dL — ABNORMAL LOW (ref 13.0–17.0)
MCH: 26.4 pg (ref 26.0–34.0)
MCHC: 30.2 g/dL (ref 30.0–36.0)
MCV: 87.4 fL (ref 80.0–100.0)
Platelets: 307 10*3/uL (ref 150–400)
RBC: 3.49 MIL/uL — ABNORMAL LOW (ref 4.22–5.81)
RDW: 16.8 % — ABNORMAL HIGH (ref 11.5–15.5)
WBC: 6.4 10*3/uL (ref 4.0–10.5)
nRBC: 0.3 % — ABNORMAL HIGH (ref 0.0–0.2)

## 2020-08-01 LAB — GLUCOSE, CAPILLARY
Glucose-Capillary: 146 mg/dL — ABNORMAL HIGH (ref 70–99)
Glucose-Capillary: 146 mg/dL — ABNORMAL HIGH (ref 70–99)
Glucose-Capillary: 89 mg/dL (ref 70–99)
Glucose-Capillary: 143 mg/dL — ABNORMAL HIGH (ref 70–99)

## 2020-08-01 LAB — PROTIME-INR
INR: 3.2 — ABNORMAL HIGH (ref 0.8–1.2)
Prothrombin Time: 31.5 seconds — ABNORMAL HIGH (ref 11.4–15.2)

## 2020-08-01 MED ORDER — DILTIAZEM HCL ER COATED BEADS 240 MG PO CP24: 240.0000 mg | ORAL_CAPSULE | Freq: Every day | ORAL | Status: DC

## 2020-08-01 MED ORDER — AMIODARONE HCL 200 MG PO TABS: 200.0000 mg | ORAL_TABLET | Freq: Every day | ORAL | Status: DC

## 2020-08-01 MED ORDER — PANTOPRAZOLE SODIUM 40 MG PO TBEC: 40.0000 mg | DELAYED_RELEASE_TABLET | Freq: Every day | ORAL | Status: DC

## 2020-08-01 MED ORDER — INSULIN DETEMIR 100 UNIT/ML ~~LOC~~ SOLN: 42.0000 [IU] | Freq: Two times a day (BID) | SUBCUTANEOUS | 11 refills | Status: DC

## 2020-08-01 MED ORDER — NORTRIPTYLINE HCL 50 MG PO CAPS: 50.0000 mg | ORAL_CAPSULE | Freq: Two times a day (BID) | ORAL | Status: DC

## 2020-08-01 MED ORDER — HYDROMORPHONE HCL 1 MG/ML IJ SOLN: 1.5000 mg | Freq: Three times a day (TID) | INTRAMUSCULAR | 0 refills | Status: DC | PRN

## 2020-08-01 MED ORDER — ENSURE MAX PROTEIN PO LIQD: 11.0000 [oz_av] | Freq: Every day | ORAL | Status: DC

## 2020-08-01 MED ORDER — DOCUSATE SODIUM 100 MG PO CAPS: 100.0000 mg | ORAL_CAPSULE | Freq: Two times a day (BID) | ORAL | 0 refills | Status: DC

## 2020-08-01 MED ORDER — ALBUTEROL SULFATE (2.5 MG/3ML) 0.083% IN NEBU: 2.5000 mg | INHALATION_SOLUTION | RESPIRATORY_TRACT | 12 refills | Status: DC | PRN

## 2020-08-01 MED ORDER — POLYETHYLENE GLYCOL 3350 17 G PO PACK: 17.0000 g | PACK | Freq: Every day | ORAL | 0 refills | Status: DC

## 2020-08-01 MED ORDER — WARFARIN SODIUM 2.5 MG PO TABS
2.5000 mg | ORAL_TABLET | Freq: Every day | ORAL | Status: DC
  Administered 2020-08-02 – 2020-08-04 (×3): 2.5 mg via ORAL
  Filled 2020-08-01 (×4): qty 1

## 2020-08-01 MED ORDER — MELATONIN 3 MG PO TABS: 3.0000 mg | ORAL_TABLET | Freq: Every evening | ORAL | 0 refills | Status: DC | PRN

## 2020-08-01 MED ORDER — OXYCODONE HCL 15 MG PO TABS: 15.0000 mg | ORAL_TABLET | ORAL | 0 refills | Status: DC | PRN

## 2020-08-01 MED ORDER — DOCUSATE SODIUM 100 MG PO CAPS
100.0000 mg | ORAL_CAPSULE | Freq: Two times a day (BID) | ORAL | Status: DC
  Administered 2020-08-01 – 2020-08-05 (×9): 100 mg via ORAL
  Filled 2020-08-01 (×10): qty 1

## 2020-08-01 MED ORDER — ADULT MULTIVITAMIN W/MINERALS CH: 1.0000 | ORAL_TABLET | Freq: Every day | ORAL | Status: DC

## 2020-08-01 MED ORDER — ACETAMINOPHEN 160 MG/5ML PO SOLN: 650.0000 mg | Freq: Four times a day (QID) | ORAL | 0 refills | Status: DC | PRN

## 2020-08-01 MED ORDER — ONDANSETRON HCL 4 MG/2ML IJ SOLN: 4.0000 mg | Freq: Four times a day (QID) | INTRAMUSCULAR | 0 refills | Status: DC | PRN

## 2020-08-01 MED ORDER — AMOXICILLIN-POT CLAVULANATE 875-125 MG PO TABS: 1.0000 | ORAL_TABLET | Freq: Three times a day (TID) | ORAL | Status: DC

## 2020-08-01 MED ORDER — ENSURE ENLIVE PO LIQD: 237.0000 mL | Freq: Two times a day (BID) | ORAL | 12 refills | Status: DC

## 2020-08-01 MED ORDER — ACETAMINOPHEN 325 MG PO TABS: 650.0000 mg | ORAL_TABLET | Freq: Four times a day (QID) | ORAL | Status: DC | PRN

## 2020-08-01 MED ORDER — LACTULOSE 10 GM/15ML PO SOLN: 10.0000 g | ORAL | 0 refills | Status: DC | PRN

## 2020-08-01 MED ORDER — WARFARIN SODIUM 2.5 MG PO TABS: 2.5000 mg | ORAL_TABLET | Freq: Every day | ORAL | Status: DC

## 2020-08-01 MED ORDER — OXYCODONE HCL ER 20 MG PO T12A: 20.0000 mg | EXTENDED_RELEASE_TABLET | Freq: Two times a day (BID) | ORAL | 0 refills | Status: DC

## 2020-08-01 MED ORDER — INSULIN ASPART 100 UNIT/ML ~~LOC~~ SOLN: 0.0000 [IU] | Freq: Three times a day (TID) | SUBCUTANEOUS | 11 refills | Status: DC

## 2020-08-01 MED ORDER — INSULIN ASPART 100 UNIT/ML ~~LOC~~ SOLN: 18.0000 [IU] | Freq: Three times a day (TID) | SUBCUTANEOUS | 11 refills | Status: DC

## 2020-08-01 MED ORDER — LACTULOSE 10 GM/15ML PO SOLN
10.0000 g | ORAL | Status: DC | PRN
  Administered 2020-08-01 – 2020-08-04 (×8): 10 g via ORAL
  Filled 2020-08-01 (×8): qty 15

## 2020-08-01 MED ORDER — INSULIN ASPART 100 UNIT/ML ~~LOC~~ SOLN: 0.0000 [IU] | Freq: Every day | SUBCUTANEOUS | 11 refills | Status: DC

## 2020-08-01 MED ORDER — SODIUM CHLORIDE 0.9 % IV SOLN: 10.0000 mL | INTRAVENOUS | 0 refills | Status: DC | PRN

## 2020-08-01 MED ORDER — WARFARIN SODIUM 1 MG PO TABS
1.0000 mg | ORAL_TABLET | Freq: Once | ORAL | Status: AC
  Administered 2020-08-01: 1 mg via ORAL
  Filled 2020-08-01: qty 1

## 2020-08-01 MED ORDER — SODIUM CHLORIDE 0.9% FLUSH: 3.0000 mL | Freq: Two times a day (BID) | INTRAVENOUS | Status: DC

## 2020-08-01 NOTE — Progress Notes (Signed)
[facility] PROGRESS NOTE  Carl Hill QQI:[id number] DOB: 12/27/1970 DOA: 06/10/2020 PCP: Malka So., MD  Status: Remains inpatient appropriate because:Altered mental status, Unsafe d/c plan and Inpatient level of care appropriate due to severity of illness   Dispo:               The patient is from: Home              Anticipated d/c is to: CIR-as of 2/28 therapy session pt improving enough to likely be reconsidered for CIR. Insurance authorization pending and it appears patient is appropriate for CIR admission.  Coordinator to speak to patient's mother on 3/3 and likely bed available next week              Patient currently is medically stable to d/c.  Patient had recent issues 2/2 intermittent orthostatic hypotension with efforts to stand utilizing specialty bed but as of 2/28 improved. Able to sit on EOB x 10 minutes on 2/28.  In addition patient has a very large deep combine decubitus involving the buttocks, sacrum, coccyx as well as wounds on both lower extremities.  These wounds occurred in the context of necrotizing soft tissue injury/fasciitis.  Patient is morbidly obese greater than 500 pounds and requires multiple providers for repositioning and providing wound care twice daily.              Barriers to DC: Morbid obesity, need for aggressive wound care, continued issues with intermittent orthostatic hypotension with activity and physical therapy.  Is very motivated to improve and return to the home environment   Difficult to place patient Yes   Level of care: Telemetry Medical  Code Status: Full Family Communication:  DVT prophylaxis: Warfarin Vaccination status: Fully vaccinated as of June 2021/Moderna; discussed with patient and on 2/25 Moderna booster shot given  Foley catheter: 16 Fr Foley placed 2/62  HPI: 50 year old male with history of morbid obesity, hypertension, diabetes with peripheral lower extremity neuropathy, atrial fibrillation.  He initially  presented to the ER on 1/10 with reports of hyperglycemia with glucose greater than 406.  He was symptomatic with dizziness lightheadedness and headache.  He reports chronic lower extremity wounds on the left heel the recent left calf skin tear.  In route to the hospital he sustained a skin tear of his right calf.  His mother had also noticed a boil on his buttocks on Thursday.  Patient at baseline is ambulatory with a cane but due to current illness mother has been assisting with his care including pericare.  On exam in the ER patient was found to have what appeared to be significant necrotizing soft tissue infection/injury to the buttocks, perineum and upper thigh regions.  Surgery was urgently consulted and patient was taken to the OR.  See below regarding other significant events.  Since that time patient has transitioned out of the ICU initially to the progressive care unit and as of the past 24 hours to 6 N. surgical unit.  Patient had been stable on a trach collar and accidentally decannulated himself this morning.  PCCM was notified and recommended leaving the trach out and patient has done well since that time.  Patient had been undergoing aggressive wound care to include hydrotherapy with significant improvement in the wound base and resolution of necrotic and other debris.  He is now tolerating saline packing wound dressings to all of his wounds twice daily.  He continues to have significant pain in the decubitus area as  well as worsening neuropathy pain in both feet as PT has worked with him and he is starting to do some partial weightbearing.  Patient very talkative and animated today on 2/25 and is very eager to continue therapy and eventually return home.  Significant Hospital events: 1/10 Admitted to [facility] with sepsis, necrotizing fasciitis. General surgery consulted and debridement pursued. In the afternoon, patient developed hypotension with AF in RVR. Synchronized cardioversion unsuccessful  with conversion to VT. CPR initated with ROSC after desynchronized defibrillation. 1/11 Debridement 1/18 Mucus plugging 1/19 Bronchoscopy >> mucus plug on Rt, airway collapse with exhalation 1/20 Persistent fever, change ABx 1/24 Increased vent requirements, habitus related +/- mucus plugging 1/25 Hypotensive to SBP 80s, Levo resumed, minimally responsive overnight off sedation, CT Head negative 1/26 Stable vent/pressor requirements, bronched, tracheostomy 1/29 start hydrotherapy 1/30 start lovenox/coumadin 2/2 Tolerating PS 2/4 did TC for 12 hours 2/5 Did 24 hours TC 2/6 Continue TC  2/8 downgrade to progressive 2/15 trach changed to # 6 cuffless  2/25 Self decannulated-trach left out   Subjective: Patient currently positioned on left side.  Having issues with Flexi-Seal functionality.  Increased pain as stool cleansed from around wound and Flexi-Seal repositioned.  States is very excited that he stood almost 4 times yesterday with PT.  Attempted to stand up independently x1 but was unable to complete the task.  Very excited about being discharged to inpatient rehab.  States his mother will be here to talk with CIR coordinators later today.  Objective: Vitals:   07/31/20 2052 08/01/20 0550  BP: 139/75 114/61  Pulse: (!) 110 97  Resp: 19 19  Temp: 98.3 F (36.8 C) 98.1 F (36.7 C)  SpO2: 99% 96%    Intake/Output Summary (Last 24 hours) at 08/01/2020 0843 Last data filed at 08/01/2020 0751 Gross per 24 hour  Intake 1920 ml  Output 2450 ml  Net -530 ml   Filed Weights   07/27/20 0500 07/28/20 0500 08/01/20 0500  Weight: (!) 246.8 kg (!) 246.8 kg (!) 254.8 kg    Exam:  Constitutional: Alert, pleasant, some distress is related to pain around wound during Flexi-Seal care Neck: Recent trach site unremarkable post decannulation Respiratory: Lungs are clear to auscultation somewhat diminished in the bases and he remained stable on room air Cardiovascular: Heart sounds are  normal, no obvious peripheral edema, pulses regular and no reemergence of orthostasis with physical activity Abdomen: Obese, soft, nontender.  Patient eating well. LBM 3/3  Skin: Multiple wounds involving the posterior surface of the legs as well as upper posterior thighs, buttocks, perineum and sacrum.  Please see pictures in chart. Neurologic: CN 2-12 grossly intact. Sensation intact, DTR normal. Strength 4/5 x all 4 extremities.   Psychiatric: Awake and alert.  Oriented x3 with appropriate affect   Assessment/Plan: Acute problems: Acute on Chronic Hypoxic and Hypercapnic Respiratory Failure in the setting of Sepsis, Morbid obesity, healthcare associated pneumonia, recurrent mucous plugging; -Sepsis physiology has resolved and PNA adequately treated -Prolonged respiratory failure requiring mechanical ventilation, failed attempt at extubation. -Status post tracheostomy on 1/26, wean off ventilator.  With subsequent downgrade to cuffless #6 trach on 2/15.  As of 2/25 patient accidentally self decannulated and decision made to leave tracheostomy tube out -Continue Vaseline gauze dressing covered by dry dressing over trach site until site closed -Once trach site closed patient will likely benefit from CPAP to use at bedtime and as needed given likelihood of underlying sleep apnea  Sepsis 2/2 Left buttock necrotizing fasciitis with E.  coli, group B strep, Peptostreptococcus in wound culture: SEPSIS RESOLVED -Excisional debridement of perianal, gluteal and perineal necrotizing soft tissue infection by Dr. Freida BusmanAllen 1/10 -Back to OR 1/11 and excisional debridement of perineal necrosis, by Dr. Freida BusmanAllen. -Received hydrotherapy from 06/29/2020 until 07/04/2020. -No further debridement.  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  Patient currently on Augmentin. -As of 2/25 CRP  remains elevated at 4.6, sed rate 138 -Continue with wound care and dressing changes. -Despite utilization of OxyIR pain control remains in adequate in regards to wounds therefore OxyContin 15 mg every 12 hours added on 2/25 as of 3/1 have increased OxyContin dosage to 20 mg every 12 hours improvement in pain -Flexi-Seal in place to minimize fecal contamination of wound.  Patient had been having abdominal cramping with Senokot therefore was changed to MiraLAX daily at bedtime only.  Stools are now thicker and having difficulty transiting through the Flexi-Seal.  Discussed with patient as well as nurse at bedside.  Plan is to initiate Colace 100 mg p.o. twice daily on schedule and provide low-dose lactulose every 4 hours as needed to thin stools.    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 Fib/acquired thrombophilia: VR Controlled.   Continue with amiodarone and diltiazem. Preadmission Xarelto discontinued per pharmacy recommendation given patient's body weight greater than 500 pounds Continue with Coumadin-INR therapeutic at 2.5  Physical deconditioning Markedly improved now sitting on edge of bed for the first time in greater than 43 days CIR admission pending insurance Auth and bed availability Continue PT and OT 3/2  Was able to stand x 3 with PT session  Diarrhea:  Has rectal tube to protect the wound. Patient reporting abdominal cramping regularly with use of laxatives for have discontinued Senokot and have changed MiraLAX to scheduled at bedtime Patient does have issues sometimes with stool becoming dry and hard and benefits from flushing of Flexi-Seal so order placed on 2/25  Pressure injury present on admission in context necrotizing skin infection/injury (Coccyx/sacrum/buttocks/perinuem/pretibial See documentation from wound care nurse Incision (Closed) 06/10/20 Coccyx Other (Comment) (Active)  Date First Assessed/Time First Assessed: 06/10/20 1250   Location: Coccyx  Location Orientation: Other (Comment)    Assessments 06/10/2020  2:30 PM 07/31/2020  8:00 AM  Dressing Type Gauze (Comment) --  Dressing Change Frequency -- Twice a day  Site / Wound Assessment -- Dressing in place / Unable to assess     No Linked orders to display  Wound / Incision (Open or Dehisced) 06/10/20 Skin tear Pretibial Right;Anterior (Active)  Date First Assessed/Time First Assessed: 06/10/20 1400   Wound Type: Skin tear  Location: Pretibial  Location Orientation: Right;Anterior  Present on Admission: Yes    Assessments 06/10/2020  2:30 PM 07/31/2020  8:00 AM  Dressing Type Petroleum --  Dressing Changed Changed --  Dressing Status Clean;Dry;Intact --  Dressing Change Frequency -- Twice a day     No Linked orders to display     Pressure Injury 06/10/20 Heel Right Stage 2 -  Partial thickness loss of dermis presenting as a shallow open injury with a red, pink wound bed without slough. (Active)  Date First Assessed/Time First Assessed: 06/10/20 1400   Location: Heel  Location Orientation: Right  Staging: Stage 2 -  Partial thickness loss of dermis presenting as a shallow open injury with a red, pink wound bed without slough.  Present on Admis...    Assessments 06/10/2020  2:30 PM 07/31/2020  8:00 AM  Dressing Type Foam -  Lift dressing to assess site every shift None  Dressing Clean;Dry;Intact --  Treatment -- Cleansed     No Linked orders to display     Incision (Closed) 06/11/20 Perineum (Active)  Date First Assessed/Time First Assessed: 06/11/20 1525   Location: Perineum    Assessments 06/11/2020  8:00 PM 07/31/2020  8:00 AM  Dressing Type Gauze (Comment);Tape dressing --  Dressing Intact --  Dressing Change Frequency Daily Twice a day  Site / Wound Assessment -- Dressing in place / Unable to assess     No Linked orders to display     Wound / Incision (Open or Dehisced) 06/10/20 Skin tear Pretibial Left (Active)  Date First Assessed/Time First Assessed: 06/10/20 1200   Wound Type: Skin tear  Location: Pretibial  Location Orientation: Left  Present on Admission: Yes    Assessments 06/11/2020  8:00 PM 07/29/2020  9:45 AM  Dressing Type Impregnated gauze (petrolatum);Abdominal pads;Gauze (Comment) None  Dressing Changed -- Changed  Dressing Status -- Clean;Dry;Intact  Dressing Change Frequency Twice a day Twice a day  Site / Wound Assessment -- Red;Yellow;Bleeding     No Linked orders to display     Wound / Incision (Open or Dehisced) 06/29/20 Buttocks (Active)  Date First Assessed/Time First Assessed: 06/29/20 1148   Location: Buttocks    Assessments 06/29/2020  2:53 PM 07/31/2020 10:00 PM  Wound Image     Dressing Type Abdominal pads;Barrier Film (skin prep);Gauze (Comment);Moist to moist Abdominal pads;Gauze (Comment)  Dressing Changed Changed --  Dressing Status Dry;Clean;Intact --  Dressing Change Frequency Daily --  Site / Wound Assessment Bleeding;Black;Red;Yellow Pink;Red;Yellow;Painful  % Wound base Red or Granulating 60% --  % Wound base Yellow/Fibrinous Exudate 30% --  % Wound base Black/Eschar 10% --  % Wound base Other/Granulation Tissue (Comment) 0% --  Peri-wound Assessment Intact --  Wound Length (cm) 25 cm --  Wound Width (cm) 11 cm --  Wound Depth (cm) 10 cm --  Wound Volume  (cm^3) 2750 cm^3 --  Wound Surface Area (cm^2) 275 cm^2 --  Margins Unattached edges (unapproximated) --  Drainage Amount Copious Moderate  Drainage Description Serosanguineous Serosanguineous  Treatment Debridement (Selective);Hydrotherapy (Pulse lavage);Packing (Saline gauze) --     No Linked orders to display   Nutrition Status: Nutrition Problem: Increased nutrient needs Etiology: wound healing Signs/Symptoms: estimated needs Interventions: Ensure Enlive (each supplement provides 350kcal and 20 grams of protein),MVI,Premier Protein,Magic cup Estimated body mass index is 80.6 kg/m  as calculated from the following:   Height as of this encounter: 5\' 10"  (1.778 m).   Weight as of this encounter: 254.8 kg.  Continue Ensure Plus, multiple vitamins  Other problems: hypernatremia  from diuresis and insensible losses: Resolved  Acute blood loss anemia  from blood loss from wound:  Status post blood transfusion. Hb  stable  Hypothyroidism:  Continue with Synthroid  Edema: Continue with Lipitor Intermittent blurry vision:  Could be related to hypotension versus narcotics,  Also happens at home.    Data Reviewed: Basic Metabolic Panel: Recent Labs  Lab 07/27/20 0814 07/29/20 0036  NA 134* 134*  K 4.0 3.4*  CL 97* 95*  CO2 23 26  GLUCOSE 183* 141*  BUN 8 10  CREATININE 0.99 0.85  CALCIUM 8.2* 8.3*   Liver Function Tests: No results for input(s): AST, ALT, ALKPHOS, BILITOT, PROT, ALBUMIN in the last 168 hours. No results for input(s): LIPASE, AMYLASE in the last 168 hours. No results for input(s): AMMONIA in the last 168 hours. CBC: Recent Labs  Lab 07/29/20 0036 08/01/20 0437  WBC 6.2 6.4  HGB 8.9* 9.2*  HCT 28.3* 30.5*  MCV 86.8 87.4  PLT 309 307   Cardiac Enzymes: No results for input(s): CKTOTAL, CKMB, CKMBINDEX, TROPONINI in the last 168 hours. BNP (last 3 results) No results for input(s): BNP in the last 8760 hours.  ProBNP (last 3 results) No  results for input(s): PROBNP in the last 8760 hours.  CBG: Recent Labs  Lab 07/31/20 0730 07/31/20 1217 07/31/20 1631 07/31/20 2158 08/01/20 0701  GLUCAP 130* 170* 129* 119* 146*    No results found for this or any previous visit (from the past 240 hour(s)).   Studies: No results found.  Scheduled Meds: . amiodarone  200 mg Oral Daily  . amoxicillin-clavulanate  1 tablet Oral Q8H  . atorvastatin  20 mg Oral QPM  . chlorhexidine  15 mL Mouth Rinse BID  . Chlorhexidine Gluconate Cloth  6 each Topical Daily  . diltiazem  240 mg Oral Daily  . feeding supplement  237 mL Oral BID BM  . insulin aspart  0-20 Units Subcutaneous TID WC  . insulin aspart  0-5 Units Subcutaneous QHS  . insulin aspart  18 Units Subcutaneous TID WC  . insulin detemir  42 Units Subcutaneous BID  . levothyroxine  75 mcg Oral Daily  . mouth rinse  15 mL Mouth Rinse q12n4p  . multivitamin with minerals  1 tablet Oral Daily  . nortriptyline  50 mg Oral BID  . oxyCODONE  20 mg Oral Q12H  . pantoprazole  40 mg Oral Daily  . polyethylene glycol  17 g Oral QHS  . pregabalin  300 mg Oral BID  . Ensure Max Protein  11 oz Oral QHS  . sodium chloride flush  3 mL Intravenous Q12H  . warfarin  3 mg Oral q1600  . Warfarin - Pharmacist Dosing Inpatient   Does not apply q1600   Continuous Infusions: . sodium chloride Stopped (06/28/20 2035)    Principal Problem:   Fournier gangrene Active Problems:   Sepsis (HCC)   DKA (diabetic ketoacidosis) (HCC)   AKI (acute kidney injury) (HCC)   Diabetic ulcer of heel (HCC)   Atrial fibrillation, chronic (HCC)   Essential hypertension   Dyslipidemia   Acquired hypothyroidism   Chronic pain disorder   Morbid obesity with BMI of 60.0-69.9, adult (HCC)   Acute respiratory failure (HCC)   Sepsis due to Streptococcus, group  [facility])   Actinomycosis   Consultants:  General surgery  PCCM  Nephrology  Infectious disease  Procedures:  Irrigation and  debridement of abscesses on 1/10 and 1/11  Antibiotics: Anti-infectives (From admission, onward)   Start     Dose/Rate Route Frequency Ordered Stop   07/12/20 1400  amoxicillin-clavulanate (AUGMENTIN) 875-125 MG per tablet 1 tablet        1 tablet Oral Every 8 hours 07/12/20 1003     06/27/20 1400  amoxicillin-clavulanate (AUGMENTIN) 875-125 MG per tablet 1 tablet  Status:  Discontinued        1 tablet Per Tube Every 8 hours 06/27/20 0939 07/12/20 1003   06/24/20 1615  Ampicillin-Sulbactam (UNASYN) 3 g in sodium chloride 0.9 % 100 mL IVPB  Status:  Discontinued        3 g 200 mL/hr over 30 Minutes Intravenous Every 6 hours 06/24/20 1517 06/27/20 0939   06/24/20 1300  amoxicillin (AMOXIL) capsule 500 mg        500 mg Oral  Once 06/24/20 1241 06/24/20 1256   06/24/20 1115  amoxicillin (AMOXIL) capsule 500 mg  Status:  Discontinued        500 mg Oral  Once 06/24/20 1025 06/24/20 1241   06/22/20 1415  meropenem (MERREM) 2 g in sodium chloride 0.9 % 100 mL IVPB  Status:  Discontinued        2 g 200 mL/hr over 30 Minutes Intravenous Every 8 hours 06/22/20 1324 06/24/20 1517   06/22/20 1400  meropenem (MERREM) 1 g in sodium chloride 0.9 % 100 mL IVPB  Status:  Discontinued        1 g 200 mL/hr over 30 Minutes Intravenous Every 8 hours 06/22/20 1248 06/22/20 1324   06/22/20 1300  doxycycline (VIBRAMYCIN) 100 mg in sodium chloride 0.9 % 250 mL IVPB  Status:  Discontinued        100 mg 125 mL/hr over 120 Minutes Intravenous Every 12 hours 06/22/20 1212 06/22/20 1248   06/20/20 1100  vancomycin (VANCOCIN) 2,500 mg in sodium chloride 0.9 % 500 mL IVPB  Status:  Discontinued        2,500 mg 250 mL/hr over 120 Minutes Intravenous Every 12 hours 06/20/20 1007 06/22/20 0813   06/20/20 1100  ceFEPIme (MAXIPIME) 2 g in sodium chloride 0.9 % 100 mL IVPB  Status:  Discontinued        2 g 200 mL/hr over 30 Minutes Intravenous Every 8 hours 06/20/20 1007 06/22/20 1207   06/17/20 1130  metroNIDAZOLE (FLAGYL)  IVPB 500 mg  Status:  Discontinued        500 mg 100 mL/hr over 60 Minutes Intravenous Every 8 hours 06/17/20 1041 06/22/20 1248   06/13/20 1400  cefTRIAXone (ROCEPHIN) 2 g in sodium chloride 0.9 % 100 mL IVPB  Status:  Discontinued        2 g 200 mL/hr over 30 Minutes Intravenous Every 24 hours 06/13/20 1242 06/20/20 0944   06/11/20 1300  vancomycin (VANCOREADY) IVPB 2000 mg/400 mL        2,000 mg 200 mL/hr over 120 Minutes Intravenous  Once 06/11/20 1212 06/11/20 1824   06/10/20 2000  clindamycin (CLEOCIN) IVPB 900 mg        900 mg 100 mL/hr over 30 Minutes Intravenous Every 8 hours 06/10/20 1307 06/13/20 1500   06/10/20 1600  ceFEPIme (MAXIPIME) 2 g in sodium chloride 0.9 % 100 mL IVPB  Status:  Discontinued  2 g 200 mL/hr over 30 Minutes Intravenous Every 12 hours 06/10/20 1504 06/13/20 1242   06/10/20 1400  metroNIDAZOLE (FLAGYL) IVPB 500 mg  Status:  Discontinued        500 mg 100 mL/hr over 60 Minutes Intravenous Every 8 hours 06/10/20 1023 06/10/20 1308   06/10/20 1300  aztreonam (AZACTAM) 2 g in sodium chloride 0.9 % 100 mL IVPB  Status:  Discontinued        2 g 200 mL/hr over 30 Minutes Intravenous Every 8 hours 06/10/20 1033 06/10/20 1504   06/10/20 1215  clindamycin (CLEOCIN) IVPB 900 mg        900 mg 100 mL/hr over 30 Minutes Intravenous To Surgery 06/10/20 1210 06/10/20 1210   06/10/20 1033  vancomycin variable dose per unstable renal function (pharmacist dosing)  Status:  Discontinued         Does not apply See admin instructions 06/10/20 1033 06/12/20 1252   06/10/20 0600  metroNIDAZOLE (FLAGYL) tablet 500 mg  Status:  Discontinued        500 mg Oral Every 8 hours 06/10/20 0406 06/10/20 0948   06/10/20 0415  aztreonam (AZACTAM) 2 g in sodium chloride 0.9 % 100 mL IVPB        2 g 200 mL/hr over 30 Minutes Intravenous  Once 06/10/20 0406 06/10/20 0546   06/10/20 0415  vancomycin (VANCOCIN) IVPB 1000 mg/200 mL premix  Status:  Discontinued        1,000 mg 200 mL/hr  over 60 Minutes Intravenous  Once 06/10/20 0406 06/10/20 0412   06/10/20 0415  vancomycin (VANCOCIN) 2,500 mg in sodium chloride 0.9 % 500 mL IVPB        2,500 mg 250 mL/hr over 120 Minutes Intravenous  Once 06/10/20 0412 06/10/20 1610       Time spent: 60 minutes    Junious Silk ANP  [facility] 7 am - 330 pm/M-F for direct patient care and secure chat Please refer to Amion for contact info 52  days

## 2020-08-01 NOTE — Progress Notes (Signed)
ANTICOAGULATION CONSULT NOTE  Pharmacy Consult for Warfarin  Indication: atrial fibrillation  Patient Measurements: Height: 5\' 10"  (177.8 cm) Weight: (!) 254.8 kg (561 lb 11.7 oz) IBW/kg (Calculated) : 73 Heparin Dosing Weight: 149.7 kg  Vital Signs: Temp: 98.1 F (36.7 C) (03/03 0550) BP: 114/61 (03/03 0550) Pulse Rate: 97 (03/03 0550)  Labs: Recent Labs    08/01/20 0437  HGB 9.2*  HCT 30.5*  PLT 307  LABPROT 31.5*  INR 3.2*    Estimated Creatinine Clearance: 219 mL/min (by C-G formula based on SCr of 0.85 mg/dL).   Assessment: 49YOM admitted with septic shock secondary to necrotizing fascitis. Was on Xarelto PTA, but that is not appropriate based on patient's weight. Pharmacy has been consulted to dose warfarin.  Patient sensitive to Warfarin in setting of drug interactions with Amiodarone and Augmentin has been on these for some time). Patient intake 100% with every meal. INR slightly supratherapeutic at 3.2 on warfarin 3mg  daily. H/H, plt stable Decrease to 1mg  today and decrease to 2.5mg  daily.  Goal of Therapy:  INR 2-3 Monitor platelets by anticoagulation protocol: Yes  Plan:  Warfarin 1mg  today then schedule 2.5 mg daily F/u QMon/Thur INR and weekly CBC Monitor for s/sx of bleeding  10/01/20, PharmD, BCPS, BCCP Clinical Pharmacist  Please check AMION for all [facility] Pharmacy phone numbers After 10:00 PM, call Main Pharmacy [phone]

## 2020-08-01 NOTE — Progress Notes (Signed)
Physical Therapy Treatment Patient Details Name: Carl Hill MRN: [id number] DOB: 07/26/1970 Today's Date: 08/01/2020    History of Present Illness Pt is a 50 year old male who presented with buttock pain, fatigue and malaise.  Found to have hyperglycemia. CT pelvis demonstrated necrotizing fasciitis in L buttock, L upper thigh, and perineum. Taken to OR for debridement 1/10 c/b Afib with RVR and VT arrest s/p CPR/defib. Trached 1/26 PMHx: HTN, obesity, DM, AFib.  As of 07/04/20 started TC trials.  Accidental self decannulated 07/26/20 and left out.    PT Comments    Pt is progressing daily with consistent therapy.  He was able to stand today three solid times with lighter two person mod assist.  He is getting better at using momentum to get up, locking out both knees and standing tall with hands on the reversed, locked recliner chair.  Third person to help stabilize the chair from moving.  I think he is ready for bari walker trials next session, weight shifting and side stepping.     Follow Up Recommendations  CIR     Equipment Recommendations  Wheelchair (measurements PT);Wheelchair cushion (measurements PT);Hospital bed;Other (comment);3in1 (PT) (all bariatric equipment, possibly WC ramp)    Recommendations for Other Services Rehab consult     Precautions / Restrictions Precautions Precautions: Fall Precaution Comments: rectal tube, foley catheter, large buttocks wound, hydro on hold    Mobility  Bed Mobility Overal bed mobility: Needs Assistance Bed Mobility: Rolling;Sidelying to Sit;Sit to Sidelying Rolling: Modified independent (Device/Increase time) Sidelying to sit: Supervision     Sit to sidelying: Mod assist;+2 for physical assistance General bed mobility comments: Pt able to roll supervision with use of bed rail, supervision to come up to sitting with HOB ~30 degrees and heavy use of rail, extra time needed to complete.  Mod assist of two people to help him get his  feet back up on the bed and where he can push against the foot board to move back towards [facility] (when we sit up he gets very far down to the end of the bed and it is difficult for him to flexh his legs and get/stabliize them on the foot board when he frist returns to side lying.  If he were in better position, I believe he could be supervision with this move as well.    Transfers Overall transfer level: Needs assistance Equipment used: 2 person hand held assist (recliner chair locked and turned backwards towards the pt, third person to stabilize.) Transfers: Sit to/from Stand Sit to Stand: Mod assist;+2 physical assistance         General transfer comment: two person mod assist (lighter mod assist today) to come to standing from elevated bed with bil knees blocked and sheet for gait belt.  Pt better able to power up with momentum "one two three" and lock out knees in standing.  On first stand even able to weight shift and get his feet up under himself.  Second and third stand were similiar.  Stood anywhere from 30-45 seconds with each stand today which was longer than yesterday.  Leaning on chair stabilized by third person for support once standing.  Ambulation/Gait             General Gait Details: We talked about the next steps towards gait progression are weight shifting in standing and side stepping EOB before we come forward away from the bed.   Stairs  Wheelchair Mobility    Modified Rankin (Stroke Patients Only)       Balance Overall balance assessment: Needs assistance Sitting-balance support: Feet supported;Bilateral upper extremity supported;No upper extremity supported;Single extremity supported Sitting balance-Leahy Scale: Good Sitting balance - Comments: supervision EOB.  EOB time was >20 mins in prep for standing and for multiple attempts.  We spoke about opportunities for him to sit EOB including during meds passing with RN in room, to eat his meals,  etc.  to get more upright time.   Standing balance support: Bilateral upper extremity supported Standing balance-Leahy Scale: Poor Standing balance comment: two person lighter mod assist in standing today, got down to min assist for breif moments (<10 seconds) once his knees were locked out.                            Cognition Arousal/Alertness: Awake/alert Behavior During Therapy: WFL for tasks assessed/performed Overall Cognitive Status: Within Functional Limits for tasks assessed                                        Exercises      General Comments General comments (skin integrity, edema, etc.): VSS throughout, no reports of lightheadedness, so BP was not take, pt no longer has continous O2 monitor, so O2 not checked and is on RA.      Pertinent Vitals/Pain Pain Assessment: Faces Faces Pain Scale: Hurts even more Pain Location: buttocks low back Pain Descriptors / Indicators: Cramping;Grimacing;Guarding Pain Intervention(s): Limited activity within patient's tolerance;Monitored during session;Repositioned;Premedicated before session    Home Living                      Prior Function            PT Goals (current goals can now be found in the care plan section) Acute Rehab PT Goals Patient Stated Goal: to be able to stand and take care of himself Progress towards PT goals: Progressing toward goals    Frequency    Min 3X/week      PT Plan Current plan remains appropriate    Co-evaluation              AM-PAC PT "6 Clicks" Mobility   Outcome Measure  Help needed turning from your back to your side while in a flat bed without using bedrails?: A Little Help needed moving from lying on your back to sitting on the side of a flat bed without using bedrails?: A Little Help needed moving to and from a bed to a chair (including a wheelchair)?: Total Help needed standing up from a chair using your arms (e.g., wheelchair or  bedside chair)?: A Lot Help needed to walk in hospital room?: Total Help needed climbing 3-5 steps with a railing? : Total 6 Click Score: 11    End of Session Equipment Utilized During Treatment: Gait belt Activity Tolerance: Patient limited by fatigue;Patient limited by pain Patient left: in bed;with call bell/phone within reach Nurse Communication: Mobility status;Other (comment) (flexiseal came out during our session.) PT Visit Diagnosis: Muscle weakness (generalized) (M62.81);Other abnormalities of gait and mobility (R26.89)     Time: 1610-9604 PT Time Calculation (min) (ACUTE ONLY): 65 min  Charges:  $Therapeutic Activity: 53-67 mins  Verdene Lennert, PT, DPT  Acute Rehabilitation [phone] pager [phone] office

## 2020-08-01 NOTE — Progress Notes (Signed)
Nutrition Follow-up  DOCUMENTATION CODES:   Morbid obesity  INTERVENTION:   -Continue double protein portions with all meals -ContinueMagic cup TID with meals, each supplement provides 290 kcal and 9 grams of protein -ContinueEnsure Enlive po BID, each supplement provides 350 kcal and 20 grams of protein -ContinueEnsure Max podaily, each supplement provides 150 kcal and 30 grams of protein. -ContinueMVI with minerals daily  NUTRITION DIAGNOSIS:   Increased nutrient needs related to wound healing as evidenced by estimated needs.  Ongoing  GOAL:   Patient will meet greater than or equal to 90% of their needs  Progressing   MONITOR:   PO intake,Supplement acceptance,Labs,Weight trends,Skin,I & O's  REASON FOR ASSESSMENT:   Consult Enteral/tube feeding initiation and management  ASSESSMENT:   50 yo male admitted with hyperglycemia, severe septic shock r/t Fournier's gangrene, AKI. PMH includes morbid obesity, HTN, DM, chronic LE wounds (L heel, L calf) A fib, laparoscopic gastric sleeve resection.  1/26- s/p tracheostomy 2/4- transitioned to trach collar 2/8- s/p FEES- advanced to full liquid diet 2/9- advanced to dysphagia 3 diet, cortrak removed 2/15- downsized to #6 cuffless trach  Reviewed I/O's: -3.1 L x 24 hours and -58.7 L since 07/18/20  UOP: 5 L x 24 hours  Rectal tube output: 400 ml x 24 hours  Pt sleeping soundly at time of visit. Mother not present.   Pt remains with good appetite. Noted meal completion 100%. Pt is fairly complaint with Ensure Max and Ensure Enlive, however, has refused a few doses of both over the past week.   Per CIR admissions coordinator notes, awaiting insurance authorization for admission.   Medications reviewed and include colace and miralax.  Labs reviewed: CBGS: 119-146 (inpatient orders for glycemic control are 0-20 units insulin aspart TID with meals, 0-5 units insulin aspart daily at bedtime, 18 units insulin aspart  TID with meals, and 42 units insulin detemir BID).   Diet Order:   Diet Order            Diet Carb Modified Fluid consistency: Thin; Room service appropriate? Yes  Diet effective now                 EDUCATION NEEDS:   Education needs have been addressed  Skin:  Skin Assessment: Skin Integrity Issues: Skin Integrity Issues:: Stage II,Other (Comment) Stage II: R heel Other: necrotizing infection to pelvis, anus, rectum  Last BM:  07/31/20 (400 ml via rectal tube)  Height:   Ht Readings from Last 1 Encounters:  06/14/20 5\' 10"  (1.778 m)    Weight:   Wt Readings from Last 1 Encounters:  08/01/20 (!) 254.8 kg    Ideal Body Weight:  75.5 kg  BMI:  Body mass index is 80.6 kg/m.  Estimated Nutritional Needs:   Kcal:  10/01/20  Protein:  150-190 grams  Fluid:  > 2 L    [id number], RD, LDN, CDCES Registered Dietitian II Certified Diabetes Care and Education Specialist Please refer to [facility] for RD and/or RD on-call/weekend/after hours pager

## 2020-08-02 ENCOUNTER — Inpatient Hospital Stay (HOSPITAL_COMMUNITY): Payer: Medicaid Other

## 2020-08-02 DIAGNOSIS — N493 Fournier gangrene: Secondary | ICD-10-CM | POA: Diagnosis not present

## 2020-08-02 LAB — GLUCOSE, CAPILLARY
Glucose-Capillary: 155 mg/dL — ABNORMAL HIGH (ref 70–99)
Glucose-Capillary: 138 mg/dL — ABNORMAL HIGH (ref 70–99)
Glucose-Capillary: 175 mg/dL — ABNORMAL HIGH (ref 70–99)
Glucose-Capillary: 145 mg/dL — ABNORMAL HIGH (ref 70–99)
Glucose-Capillary: 131 mg/dL — ABNORMAL HIGH (ref 70–99)

## 2020-08-02 MED ORDER — DOCUSATE SODIUM 283 MG RE ENEM: 1.0000 | ENEMA | Freq: Once | RECTAL | Status: DC

## 2020-08-02 MED ORDER — FLEET ENEMA 7-19 GM/118ML RE ENEM
1.0000 | ENEMA | Freq: Once | RECTAL | Status: AC
  Administered 2020-08-02: 1 via RECTAL
  Filled 2020-08-02: qty 1

## 2020-08-02 MED ORDER — FLEET ENEMA 7-19 GM/118ML RE ENEM: 1.0000 | ENEMA | Freq: Once | RECTAL | Status: DC

## 2020-08-02 MED ORDER — POLYETHYLENE GLYCOL 3350 17 G PO PACK
17.0000 g | PACK | Freq: Two times a day (BID) | ORAL | Status: DC
  Administered 2020-08-02 – 2020-08-05 (×6): 17 g via ORAL
  Filled 2020-08-02 (×6): qty 1

## 2020-08-02 MED ORDER — POLYETHYLENE GLYCOL 3350 17 G PO PACK: 17.0000 g | PACK | Freq: Two times a day (BID) | ORAL | 0 refills | Status: DC

## 2020-08-02 NOTE — Progress Notes (Signed)
Patient has c/o abdominal cramps after flexiseal was reinserted. He stated he feels like he needs to have a BM but he can't. flexiseal was removed and still having same cramps. Paged B. Morrison,NP and new order was received. Endorse to incoming shift nurse.

## 2020-08-02 NOTE — Progress Notes (Signed)
OT Cancellation Note  Patient Details Name: Carl Hill MRN: [id number] DOB: 27-Sep-1970   Cancelled Treatment:    Reason Eval/Treat Not Completed: Pain limiting ability to participate. Pt in too much pain to participate this am. Pt apparently upset because he is wanting to participate with OT. Discussed with MD. Will continue to follow.  Thornell Mule, OT/L   Acute OT Clinical Specialist Acute Rehabilitation Services Pager [phone] Office [phone]  08/02/2020, 9:04 AM

## 2020-08-02 NOTE — Progress Notes (Signed)
ANTICOAGULATION CONSULT NOTE  Pharmacy Consult for Warfarin  Indication: atrial fibrillation  Patient Measurements: Height: 5\' 10"  (177.8 cm) Weight: (!) 252.1 kg (555 lb 12.5 oz) IBW/kg (Calculated) : 73  Vital Signs: Temp: 98.1 F (36.7 C) (03/04 0503) Temp Source: Oral (03/04 0503) BP: 134/82 (03/04 0503) Pulse Rate: 102 (03/04 0503)  Labs: Recent Labs    08/01/20 0437  HGB 9.2*  HCT 30.5*  PLT 307  LABPROT 31.5*  INR 3.2*    Estimated Creatinine Clearance: 217.4 mL/min (by C-G formula based on SCr of 0.85 mg/dL).   Assessment: 49YOM admitted with septic shock secondary to necrotizing fascitis. Was on Xarelto PTA but not appropriate based on patient's weight. Pharmacy consulted to dose warfarin.  Patient sensitive to warfarin in setting of drug interactions with amiodarone and Augmentin (has been on these for some time). Patient intake 100% with every meal. INR slightly supratherapeutic at 3.2 (on 3/3) on warfarin 3mg  daily. CBC stable. No bleeding issues reported.  Warfarin dose was reduced x 1 on 3/3 and then resumed at lower dose 2.5mg  for today.  Goal of Therapy:  INR 2-3 Monitor platelets by anticoagulation protocol: Yes  Plan:  Continue warfarin 2.5mg  PO daily F/u QMon/Thur INR and weekly CBC Monitor for s/sx of bleeding   10/01/20, PharmD, BCPS Please check AMION for all [facility] Pharmacy contact numbers Clinical Pharmacist 08/02/2020 11:16 AM

## 2020-08-02 NOTE — Progress Notes (Signed)
Inpatient Rehab Admissions Coordinator:  Saw pt at bedside. Spoke with pt and mom via phone to answer questions regarding CIR. After questions answered, mom and pt would like to proceed with pursuing CIR.  Will continue to follow.  Wolfgang Phoenix, MS, CCC-SLP Admissions Coordinator [phone]

## 2020-08-02 NOTE — Progress Notes (Signed)
[facility] PROGRESS NOTE  Carl Hill QQI:[id number] DOB: 12/27/1970 DOA: 06/10/2020 PCP: Malka So., MD  Status: Remains inpatient appropriate because:Altered mental status, Unsafe d/c plan and Inpatient level of care appropriate due to severity of illness   Dispo:               The patient is from: Home              Anticipated d/c is to: CIR-as of 2/28 therapy session pt improving enough to likely be reconsidered for CIR. Insurance authorization pending and it appears patient is appropriate for CIR admission.  Coordinator to speak to patient's mother on 3/3 and likely bed available next week              Patient currently is medically stable to d/c.  Patient had recent issues 2/2 intermittent orthostatic hypotension with efforts to stand utilizing specialty bed but as of 2/28 improved. Able to sit on EOB x 10 minutes on 2/28.  In addition patient has a very large deep combine decubitus involving the buttocks, sacrum, coccyx as well as wounds on both lower extremities.  These wounds occurred in the context of necrotizing soft tissue injury/fasciitis.  Patient is morbidly obese greater than 500 pounds and requires multiple providers for repositioning and providing wound care twice daily.              Barriers to DC: Morbid obesity, need for aggressive wound care, continued issues with intermittent orthostatic hypotension with activity and physical therapy.  Is very motivated to improve and return to the home environment   Difficult to place patient Yes   Level of care: Telemetry Medical  Code Status: Full Family Communication:  DVT prophylaxis: Warfarin Vaccination status: Fully vaccinated as of June 2021/Moderna; discussed with patient and on 2/25 Moderna booster shot given  Foley catheter: 16 Fr Foley placed 2/62  HPI: 50 year old male with history of morbid obesity, hypertension, diabetes with peripheral lower extremity neuropathy, atrial fibrillation.  He initially  presented to the ER on 1/10 with reports of hyperglycemia with glucose greater than 406.  He was symptomatic with dizziness lightheadedness and headache.  He reports chronic lower extremity wounds on the left heel the recent left calf skin tear.  In route to the hospital he sustained a skin tear of his right calf.  His mother had also noticed a boil on his buttocks on Thursday.  Patient at baseline is ambulatory with a cane but due to current illness mother has been assisting with his care including pericare.  On exam in the ER patient was found to have what appeared to be significant necrotizing soft tissue infection/injury to the buttocks, perineum and upper thigh regions.  Surgery was urgently consulted and patient was taken to the OR.  See below regarding other significant events.  Since that time patient has transitioned out of the ICU initially to the progressive care unit and as of the past 24 hours to 6 N. surgical unit.  Patient had been stable on a trach collar and accidentally decannulated himself this morning.  PCCM was notified and recommended leaving the trach out and patient has done well since that time.  Patient had been undergoing aggressive wound care to include hydrotherapy with significant improvement in the wound base and resolution of necrotic and other debris.  He is now tolerating saline packing wound dressings to all of his wounds twice daily.  He continues to have significant pain in the decubitus area as  well as worsening neuropathy pain in both feet as PT has worked with him and he is starting to do some partial weightbearing.  Patient very talkative and animated today on 2/25 and is very eager to continue therapy and eventually return home.  Significant Hospital events: 1/10 Admitted to [facility] with sepsis, necrotizing fasciitis. General surgery consulted and debridement pursued. In the afternoon, patient developed hypotension with AF in RVR. Synchronized cardioversion unsuccessful  with conversion to VT. CPR initated with ROSC after desynchronized defibrillation. 1/11 Debridement 1/18 Mucus plugging 1/19 Bronchoscopy >> mucus plug on Rt, airway collapse with exhalation 1/20 Persistent fever, change ABx 1/24 Increased vent requirements, habitus related +/- mucus plugging 1/25 Hypotensive to SBP 80s, Levo resumed, minimally responsive overnight off sedation, CT Head negative 1/26 Stable vent/pressor requirements, bronched, tracheostomy 1/29 start hydrotherapy 1/30 start lovenox/coumadin 2/2 Tolerating PS 2/4 did TC for 12 hours 2/5 Did 24 hours TC 2/6 Continue TC  2/8 downgrade to progressive 2/15 trach changed to # 6 cuffless  2/25 Self decannulated-trach left out   Subjective: Patient very uncomfortable this morning.  Reporting severe colicky abdominal pain that occurred after attempts to advance Flexi-Seal.  This is a new pain compared to his previous pain.  Has been having recurrent issues with pain and sensation of need to pass bowel movement with any placement of Flexi-Seal over the past few days.  Objective: Vitals:   08/01/20 2111 08/02/20 0503  BP: (!) 121/56 134/82  Pulse: (!) 110 (!) 102  Resp: 19 19  Temp: 98.2 F (36.8 C) 98.1 F (36.7 C)  SpO2: 98% 96%    Intake/Output Summary (Last 24 hours) at 08/02/2020 1610 Last data filed at 08/02/2020 0730 Gross per 24 hour  Intake 1950 ml  Output 5325 ml  Net -3375 ml   Filed Weights   07/28/20 0500 08/01/20 0500 08/02/20 0500  Weight: (!) 246.8 kg (!) 254.8 kg (!) 252.1 kg    Exam:  Constitutional: Alert, in moderate distress as evidenced by colicky abdominal discomfort Neck: Previous trach site healed with granular tissue Respiratory: Lungs are clear to auscultation and decreased in the bases and is stable on room Cardiovascular: Sounds are normal, he is hemodynamically stable and extremities are warm to touch Abdomen:  soft and somewhat tender in the suprapubic region without any guarding or  rebounding.  Hypoactive bowel sounds.  LBM 3/3  Skin: Multiple wounds involving the posterior surface of the legs as well as upper posterior thighs, buttocks, perineum and sacrum.  Please see pictures in chart. Neurologic: CN 2-12 grossly intact. Sensation intact, DTR normal. Strength 4/5 x all 4 extremities.   Psychiatric: Anxious secondary to abdominal pain, oriented x3.   Assessment/Plan: Acute problems: Acute on Chronic Hypoxic and Hypercapnic Respiratory Failure in the setting of Sepsis, Morbid obesity, healthcare associated pneumonia, recurrent mucous plugging; -Sepsis physiology has resolved and PNA adequately treated -Prolonged respiratory failure requiring mechanical ventilation, failed attempt at extubation. -Status post tracheostomy on 1/26, wean off ventilator.  With subsequent downgrade to cuffless #6 trach on 2/15.  As of 2/25 patient accidentally self decannulated and decision made to leave tracheostomy tube out -Continue Vaseline gauze dressing covered by dry dressing over trach site until site closed -Once trach site closed patient will likely benefit from CPAP to use at bedtime and as needed given likelihood of underlying sleep apnea  Sepsis 2/2 Left buttock necrotizing fasciitis with E. coli, group B strep, Peptostreptococcus in wound culture: SEPSIS RESOLVED -Excisional debridement of perianal, gluteal and perineal  necrotizing soft tissue infection by Dr. Freida Busman 1/10 -Back to OR 1/11 and excisional debridement of perineal necrosis, by Dr. Freida Busman. -Received hydrotherapy from 06/29/2020 until 07/04/2020. -No further debridement.  General surgery recommended wound care RN follow -Seen by infectious disease who recommended Unasyn followed by Augmentin for 36-month course due to actinomyces in the wound.  Patient currently on Augmentin. -As of 2/25 CRP remains elevated at 4.6, sed rate 138 -Continue with wound care and dressing changes. -Despite utilization of OxyIR pain control  remains in adequate in regards to wounds therefore OxyContin 15 mg every 12 hours added on 2/25 as of 3/1 have increased OxyContin dosage to 20 mg every 12 hours improvement in pain -Flexi-Seal in place to minimize fecal contamination of wound.  Patient had been having abdominal cramping with Senokot therefore was changed to MiraLAX daily at bedtime only.  Stools are now thicker and having difficulty transiting through the Flexi-Seal.  Discussed with patient as well as nurse at bedside.  Plan is to initiate Colace 100 mg p.o. twice daily on schedule and provide low-dose lactulose every 4 hours as needed to thin stools.    Date of admit   06/03/2020                      06/25/2020-preop     06/15/2020 postop          06/22/2020     06/29/2020                       07/01/2020     07/08/2020  Abdominal pain secondary to severe constipation And onset of change in rectal and abdominal pain after advancement in Flexi-Seal concerns over possible perforation event Noncontrasted CT of the abdomen and pelvis revealed no evidence of free air or perforation but significant amount of stool in the colon including marked amount of stool in the rectum which likely explains patient's symptoms with placement of Flexi-Seal Attending physician updated on above symptoms and imaging results Plan Fleet enema x1, increase MiraLAX to twice daily and continue as needed Chronulac as well as scheduled Colace  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 Fib/acquired thrombophilia: VR Controlled.   Continue with amiodarone and diltiazem. Preadmission Xarelto discontinued per pharmacy recommendation given patient's body weight greater than 500 pounds Continue with Coumadin-INR therapeutic at 2.5  Physical deconditioning Markedly improved now sitting on edge of bed for the first time in greater than 43 days CIR admission pending insurance Auth and bed availability Continue PT and OT 3/2 Was able to stand x 3 with PT session  Diarrhea:  Has rectal tube to protect the wound. Patient reporting abdominal cramping regularly with use of laxatives for have discontinued Senokot and have changed MiraLAX to scheduled at bedtime Patient does have issues sometimes with stool becoming dry and hard and benefits from flushing of Flexi-Seal so order placed on 2/25  Pressure injury present on admission in context necrotizing skin infection/injury (Coccyx/sacrum/buttocks/perinuem/pretibial See documentation from wound care nurse  Incision (Closed) 06/10/20 Coccyx Other (Comment) (Active)  Date First Assessed/Time First Assessed: 06/10/20 1250   Location: Coccyx  Location Orientation: Other (Comment)    Assessments 06/10/2020  2:30 PM 07/31/2020  8:00 AM  Dressing Type Gauze (Comment) -  Dressing Change Frequency - Twice a day  Site / Wound Assessment - Dressing in place / Unable to assess     No Linked orders to display     Wound / Incision (Open or Dehisced) 06/10/20 Skin tear Pretibial Right;Anterior (Active)  Date First Assessed/Time First Assessed: 06/10/20 1400   Wound Type: Skin tear  Location: Pretibial  Location Orientation: Right;Anterior  Present on Admission: Yes    Assessments 06/10/2020  2:30 PM 07/31/2020  8:00 AM  Dressing Type Petroleum -  Dressing Changed Changed -  Dressing Status Clean;Dry;Intact -  Dressing Change Frequency - Twice a day     No Linked orders to display     Pressure Injury 06/10/20  Heel Right Stage 2 -  Partial thickness loss of dermis presenting as a shallow open injury with a red, pink wound bed without slough. (Active)  Date First Assessed/Time First Assessed: 06/10/20 1400   Location: Heel  Location Orientation: Right  Staging: Stage 2 -  Partial thickness loss of dermis presenting as a shallow open injury with a red, pink wound bed without slough.  Present on Admis...    Assessments 06/10/2020  2:30 PM 07/31/2020  8:00 AM  Dressing Type Foam - Lift dressing to assess site every shift None  Dressing Clean;Dry;Intact -  Treatment - Cleansed     No Linked orders to display     Incision (Closed) 06/11/20 Perineum (Active)  Date First Assessed/Time First Assessed: 06/11/20 1525   Location: Perineum    Assessments 06/11/2020  8:00 PM 07/31/2020  8:00 AM  Dressing Type Gauze (Comment);Tape dressing -  Dressing Intact -  Dressing Change Frequency Daily Twice a day  Site / Wound Assessment - Dressing in place / Unable to assess     No Linked orders to display     Wound / Incision (Open or Dehisced) 06/10/20 Skin tear Pretibial Left (Active)  Date First Assessed/Time First Assessed: 06/10/20 1200   Wound Type: Skin tear  Location: Pretibial  Location Orientation: Left  Present on Admission: Yes    Assessments 06/11/2020  8:00 PM 07/29/2020  9:45 AM  Dressing Type Impregnated gauze (petrolatum);Abdominal pads;Gauze (Comment) None  Dressing Changed - Changed  Dressing Status - Clean;Dry;Intact  Dressing Change Frequency Twice a day Twice a day  Site / Wound Assessment - Red;Yellow;Bleeding     No Linked orders to display     Wound / Incision (Open or Dehisced) 06/29/20 Buttocks (Active)  Date First Assessed/Time First Assessed: 06/29/20 1148   Location: Buttocks    Assessments 06/29/2020  2:53 PM 07/31/2020 10:00 PM  Wound Image     Dressing Type Abdominal pads;Barrier Film (skin prep);Gauze (Comment);Moist to moist Abdominal pads;Gauze (Comment)  Dressing Changed Changed  -  Dressing Status Dry;Clean;Intact -  Dressing Change Frequency Daily -  Site / Wound Assessment Bleeding;Black;Red;Yellow Pink;Red;Yellow;Painful  % Wound base Red or Granulating 60% -  % Wound base Yellow/Fibrinous Exudate 30% -  % Wound base Black/Eschar 10% -  % Wound base Other/Granulation Tissue (Comment) 0% -  Peri-wound Assessment Intact -  Wound Length (cm) 25 cm -  Wound Width (cm) 11 cm -  Wound Depth (cm) 10 cm -  Wound Volume (cm^3) 2750 cm^3 -  Wound  Surface Area (cm^2) 275 cm^2 -  Margins Unattached edges (unapproximated) -  Drainage Amount Copious Moderate  Drainage Description Serosanguineous Serosanguineous  Treatment Debridement (Selective);Hydrotherapy (Pulse lavage);Packing (Saline gauze) -     No Linked orders to display   Nutrition Status: Nutrition Problem: Increased nutrient needs Etiology: wound healing Signs/Symptoms: estimated needs Interventions: Ensure Enlive (each supplement provides 350kcal and 20 grams of protein),MVI,Premier Protein,Magic cup Estimated body mass index is 79.75 kg/m as calculated from the following:   Height as of this encounter:  (1.778 m).   Weight as of this encounter: 252.1 kg.  Continue Ensure Plus, multiple vitamins  Other problems: hypernatremia  from diuresis and insensible losses: Resolved  Acute blood loss anemia  from blood loss from wound:  Status post blood transfusion. Hb  stable  Hypothyroidism:  Continue with Synthroid  Edema: Continue with Lipitor Intermittent blurry vision:  Could be related to hypotension versus narcotics,  Also happens at home.    Data Reviewed: Basic Metabolic Panel: Recent Labs  Lab 07/27/20 0814 07/29/20 0036  NA 134* 134*  K 4.0 3.4*  CL 97* 95*  CO2 23 26  GLUCOSE 183* 141*  BUN 8 10  CREATININE 0.99 0.85  CALCIUM 8.2* 8.3*   Liver Function Tests: No results for input(s): AST, ALT, ALKPHOS, BILITOT, PROT, ALBUMIN in the last 168 hours. No results for  input(s): LIPASE, AMYLASE in the last 168 hours. No results for input(s): AMMONIA in the last 168 hours. CBC: Recent Labs  Lab 07/29/20 0036 08/01/20 0437  WBC 6.2 6.4  HGB 8.9* 9.2*  HCT 28.3* 30.5*  MCV 86.8 87.4  PLT 309 307   Cardiac Enzymes: No results for input(s): CKTOTAL, CKMB, CKMBINDEX, TROPONINI in the last 168 hours. BNP (last 3 results) No results for input(s): BNP in the last 8760 hours.  ProBNP (last 3 results) No results for input(s): PROBNP in the last 8760 hours.  CBG: Recent Labs  Lab 08/01/20 1151 08/01/20 1651 08/01/20 2140 08/02/20 0633 08/02/20 0751  GLUCAP 146* 89 143* 155* 138*    No results found for this or any previous visit (from the past 240 hour(s)).   Studies: No results found.  Scheduled Meds: . amiodarone  200 mg Oral Daily  . amoxicillin-clavulanate  1 tablet Oral Q8H  . atorvastatin  20 mg Oral QPM  . chlorhexidine  15 mL Mouth Rinse BID  . Chlorhexidine Gluconate Cloth  6 each Topical Daily  . diltiazem  240 mg Oral Daily  . docusate sodium  100 mg Oral BID  . feeding supplement  237 mL Oral BID BM  . insulin aspart  0-20 Units Subcutaneous TID WC  . insulin aspart  0-5 Units Subcutaneous QHS  . insulin aspart  18 Units Subcutaneous TID WC  . insulin detemir  42 Units Subcutaneous BID  . levothyroxine  75 mcg Oral Daily  . mouth rinse  15 mL Mouth Rinse q12n4p  . multivitamin with minerals  1 tablet Oral Daily  . nortriptyline  50 mg Oral BID  . oxyCODONE  20 mg Oral Q12H  . pantoprazole  40 mg Oral Daily  . polyethylene glycol  17 g Oral QHS  . pregabalin  300 mg Oral BID  . Ensure Max Protein  11 oz Oral QHS  . sodium chloride flush  3 mL Intravenous Q12H  . sodium phosphate  1 enema Rectal Once  . warfarin  2.5 mg Oral q1600  . Warfarin - Pharmacist Dosing Inpatient  Does not apply q1600   Continuous Infusions: . sodium chloride Stopped (06/28/20 2035)    Principal Problem:   Fournier gangrene Active  Problems:   Sepsis (HCC)   DKA (diabetic ketoacidosis) (HCC)   AKI (acute kidney injury) (HCC)   Diabetic ulcer of heel (HCC)   Atrial fibrillation, chronic (HCC)   Essential hypertension   Dyslipidemia   Acquired hypothyroidism   Chronic pain disorder   Morbid obesity with BMI of 60.0-69.9, adult (HCC)   Acute respiratory failure (HCC)   Sepsis due to Streptococcus, group B (HCC)   Actinomycosis   Consultants:  General surgery  PCCM  Nephrology  Infectious disease  Procedures:  Irrigation and debridement of abscesses on 1/10 and 1/11  Antibiotics: Anti-infectives (From admission, onward)   Start     Dose/Rate Route Frequency Ordered Stop   08/01/20 0000  amoxicillin-clavulanate (AUGMENTIN) 875-125 MG tablet        1 tablet Oral Every 8 hours 08/01/20 1440     07/12/20 1400  amoxicillin-clavulanate (AUGMENTIN) 875-125 MG per tablet 1 tablet        1 tablet Oral Every 8 hours 07/12/20 1003     06/27/20 1400  amoxicillin-clavulanate (AUGMENTIN) 875-125 MG per tablet 1 tablet  Status:  Discontinued        1 tablet Per Tube Every 8 hours 06/27/20 0939 07/12/20 1003   06/24/20 1615  Ampicillin-Sulbactam (UNASYN) 3 g in sodium chloride 0.9 % 100 mL IVPB  Status:  Discontinued        3 g 200 mL/hr over 30 Minutes Intravenous Every 6 hours 06/24/20 1517 06/27/20 0939   06/24/20 1300  amoxicillin (AMOXIL) capsule 500 mg        500 mg Oral  Once 06/24/20 1241 06/24/20 1256   06/24/20 1115  amoxicillin (AMOXIL) capsule 500 mg  Status:  Discontinued        500 mg Oral  Once 06/24/20 1025 06/24/20 1241   06/22/20 1415  meropenem (MERREM) 2 g in sodium chloride 0.9 % 100 mL IVPB  Status:  Discontinued        2 g 200 mL/hr over 30 Minutes Intravenous Every 8 hours 06/22/20 1324 06/24/20 1517   06/22/20 1400  meropenem (MERREM) 1 g in sodium chloride 0.9 % 100 mL IVPB  Status:  Discontinued        1 g 200 mL/hr over 30 Minutes Intravenous Every 8 hours 06/22/20 1248 06/22/20 1324    06/22/20 1300  doxycycline (VIBRAMYCIN) 100 mg in sodium chloride 0.9 % 250 mL IVPB  Status:  Discontinued        100 mg 125 mL/hr over 120 Minutes Intravenous Every 12 hours 06/22/20 1212 06/22/20 1248   06/20/20 1100  vancomycin (VANCOCIN) 2,500 mg in sodium chloride 0.9 % 500 mL IVPB  Status:  Discontinued        2,500 mg 250 mL/hr over 120 Minutes Intravenous Every 12 hours 06/20/20 1007 06/22/20 0813   06/20/20 1100  ceFEPIme (MAXIPIME) 2 g in sodium chloride 0.9 % 100 mL IVPB  Status:  Discontinued        2 g 200 mL/hr over 30 Minutes Intravenous Every 8 hours 06/20/20 1007 06/22/20 1207   06/17/20 1130  metroNIDAZOLE (FLAGYL) IVPB 500 mg  Status:  Discontinued        500 mg 100 mL/hr over 60 Minutes Intravenous Every 8 hours 06/17/20 1041 06/22/20 1248   06/13/20 1400  cefTRIAXone (ROCEPHIN) 2 g in sodium chloride 0.9 %  100 mL IVPB  Status:  Discontinued        2 g 200 mL/hr over 30 Minutes Intravenous Every 24 hours 06/13/20 1242 06/20/20 0944   06/11/20 1300  vancomycin (VANCOREADY) IVPB 2000 mg/400 mL        2,000 mg 200 mL/hr over 120 Minutes Intravenous  Once 06/11/20 1212 06/11/20 1824   06/10/20 2000  clindamycin (CLEOCIN) IVPB 900 mg        900 mg 100 mL/hr over 30 Minutes Intravenous Every 8 hours 06/10/20 1307 06/13/20 1500   06/10/20 1600  ceFEPIme (MAXIPIME) 2 g in sodium chloride 0.9 % 100 mL IVPB  Status:  Discontinued        2 g 200 mL/hr over 30 Minutes Intravenous Every 12 hours 06/10/20 1504 06/13/20 1242   06/10/20 1400  metroNIDAZOLE (FLAGYL) IVPB 500 mg  Status:  Discontinued        500 mg 100 mL/hr over 60 Minutes Intravenous Every 8 hours 06/10/20 1023 06/10/20 1308   06/10/20 1300  aztreonam (AZACTAM) 2 g in sodium chloride 0.9 % 100 mL IVPB  Status:  Discontinued        2 g 200 mL/hr over 30 Minutes Intravenous Every 8 hours 06/10/20 1033 06/10/20 1504   06/10/20 1215  clindamycin (CLEOCIN) IVPB 900 mg        900 mg 100 mL/hr over 30 Minutes  Intravenous To Surgery 06/10/20 1210 06/10/20 1210   06/10/20 1033  vancomycin variable dose per unstable renal function (pharmacist dosing)  Status:  Discontinued         Does not apply See admin instructions 06/10/20 1033 06/12/20 1252   06/10/20 0600  metroNIDAZOLE (FLAGYL) tablet 500 mg  Status:  Discontinued        500 mg Oral Every 8 hours 06/10/20 0406 06/10/20 0948   06/10/20 0415  aztreonam (AZACTAM) 2 g in sodium chloride 0.9 % 100 mL IVPB        2 g 200 mL/hr over 30 Minutes Intravenous  Once 06/10/20 0406 06/10/20 0546   06/10/20 0415  vancomycin (VANCOCIN) IVPB 1000 mg/200 mL premix  Status:  Discontinued        1,000 mg 200 mL/hr over 60 Minutes Intravenous  Once 06/10/20 0406 06/10/20 0412   06/10/20 0415  vancomycin (VANCOCIN) 2,500 mg in sodium chloride 0.9 % 500 mL IVPB        2,500 mg 250 mL/hr over 120 Minutes Intravenous  Once 06/10/20 0412 06/10/20 3568       Time spent: 60 minutes    Junious Silk ANP  [facility] 7 am - 330 pm/M-F for direct patient care and secure chat Please refer to Amion for contact info 53  days

## 2020-08-03 DIAGNOSIS — A429 Actinomycosis, unspecified: Secondary | ICD-10-CM | POA: Diagnosis not present

## 2020-08-03 DIAGNOSIS — E039 Hypothyroidism, unspecified: Secondary | ICD-10-CM | POA: Diagnosis not present

## 2020-08-03 DIAGNOSIS — N493 Fournier gangrene: Secondary | ICD-10-CM | POA: Diagnosis not present

## 2020-08-03 DIAGNOSIS — J9601 Acute respiratory failure with hypoxia: Secondary | ICD-10-CM | POA: Diagnosis not present

## 2020-08-03 LAB — COMPREHENSIVE METABOLIC PANEL
ALT: 17 U/L (ref 0–44)
AST: 23 U/L (ref 15–41)
Albumin: 2 g/dL — ABNORMAL LOW (ref 3.5–5.0)
Alkaline Phosphatase: 86 U/L (ref 38–126)
Anion gap: 8 (ref 5–15)
BUN: 7 mg/dL (ref 6–20)
CO2: 31 mmol/L (ref 22–32)
Calcium: 8.2 mg/dL — ABNORMAL LOW (ref 8.9–10.3)
Chloride: 96 mmol/L — ABNORMAL LOW (ref 98–111)
Creatinine, Ser: 0.92 mg/dL (ref 0.61–1.24)
GFR, Estimated: >60 mL/min (ref >60)
Glucose, Bld: 128 mg/dL — ABNORMAL HIGH (ref 70–99)
Potassium: 3.3 mmol/L — ABNORMAL LOW (ref 3.5–5.1)
Sodium: 135 mmol/L (ref 135–145)
Total Bilirubin: 0.5 mg/dL (ref 0.3–1.2)
Total Protein: 6.3 g/dL — ABNORMAL LOW (ref 6.5–8.1)

## 2020-08-03 LAB — CBC
HCT: 30.4 % — ABNORMAL LOW (ref 39.0–52.0)
Hemoglobin: 8.9 g/dL — ABNORMAL LOW (ref 13.0–17.0)
MCH: 25.6 pg — ABNORMAL LOW (ref 26.0–34.0)
MCHC: 29.3 g/dL — ABNORMAL LOW (ref 30.0–36.0)
MCV: 87.4 fL (ref 80.0–100.0)
Platelets: 322 10*3/uL (ref 150–400)
RBC: 3.48 MIL/uL — ABNORMAL LOW (ref 4.22–5.81)
RDW: 16.9 % — ABNORMAL HIGH (ref 11.5–15.5)
WBC: 7.2 10*3/uL (ref 4.0–10.5)
nRBC: 0.4 % — ABNORMAL HIGH (ref 0.0–0.2)

## 2020-08-03 LAB — MAGNESIUM: Magnesium: 1.7 mg/dL (ref 1.7–2.4)

## 2020-08-03 LAB — GLUCOSE, CAPILLARY
Glucose-Capillary: 204 mg/dL — ABNORMAL HIGH (ref 70–99)
Glucose-Capillary: 165 mg/dL — ABNORMAL HIGH (ref 70–99)
Glucose-Capillary: 161 mg/dL — ABNORMAL HIGH (ref 70–99)
Glucose-Capillary: 116 mg/dL — ABNORMAL HIGH (ref 70–99)

## 2020-08-03 LAB — C-REACTIVE PROTEIN: CRP: 8.4 mg/dL — ABNORMAL HIGH (ref <1.0)

## 2020-08-03 MED ORDER — POTASSIUM CHLORIDE CRYS ER 20 MEQ PO TBCR
20.0000 meq | EXTENDED_RELEASE_TABLET | ORAL | Status: AC
  Administered 2020-08-03 (×3): 20 meq via ORAL
  Filled 2020-08-03 (×3): qty 1

## 2020-08-03 MED ORDER — MAGNESIUM CITRATE PO SOLN
1.0000 | Freq: Once | ORAL | Status: AC
  Administered 2020-08-03: 1 via ORAL
  Filled 2020-08-03: qty 296

## 2020-08-03 MED ORDER — FLEET ENEMA 7-19 GM/118ML RE ENEM
1.0000 | ENEMA | Freq: Once | RECTAL | Status: AC
  Administered 2020-08-03: 1 via RECTAL
  Filled 2020-08-03: qty 1

## 2020-08-03 MED ORDER — SACCHAROMYCES BOULARDII 250 MG PO CAPS
250.0000 mg | ORAL_CAPSULE | Freq: Two times a day (BID) | ORAL | Status: DC
  Administered 2020-08-03 – 2020-08-05 (×6): 250 mg via ORAL
  Filled 2020-08-03 (×7): qty 1

## 2020-08-03 NOTE — Plan of Care (Signed)
  Problem: Health Behavior/Discharge Planning: Goal: Ability to manage health-related needs will improve Outcome: Progressing   

## 2020-08-03 NOTE — Progress Notes (Signed)
Patient decided on taking the mag citrate. Will continue to monitor through end of shift.

## 2020-08-03 NOTE — Progress Notes (Signed)
PROGRESS NOTE   Carl Hill  UXN:[id number] DOB: [birth year] DOA: 06/10/2020 PCP: Malka So., MD   Chief Complaint  Patient presents with  . Hyperglycemia   Level of care: Telemetry Medical  Brief Admission History:  50 y.o. male with medical history significant of morbid obesity; HTN; DM with foot ulcers; and afib presenting with hyperglycemia.  He was found to have a gluteal abscesses and cellulitis, sepsis with septic shock, treated in the OR with irrigation and debridement followed by a prolonged complicated hospital course.   Assessment & Plan:   Principal Problem:   Fournier gangrene Active Problems:   Sepsis (HCC)   DKA (diabetic ketoacidosis) (HCC)   AKI (acute kidney injury) (HCC)   Diabetic ulcer of heel (HCC)   Atrial fibrillation, chronic (HCC)   Essential hypertension   Dyslipidemia   Acquired hypothyroidism   Chronic pain disorder   Morbid obesity with BMI of 60.0-69.9, adult (HCC)   Acute respiratory failure (HCC)   Sepsis due to Streptococcus, group B (HCC)   Actinomycosis   1. Sepsis with septic shock - RESOLVED - secondary to necrotizing fasciitis with E coli, GBS and peptostreptocossus. ID consulted and recommended Unasyn followed by 6 month course of oral augmentin due to actinomyces in wound.  2. Acute on chronic hypercapneic respiratory faiilure, OHS, HCAP - Pneumonia has been fully treated and resolve. Pt now s/p tracheostomy which is now out.   3. Chronic constipation - Pt remains severely constipated.  I reached out to CCS team on 3/5.  They feel that he has a stool ball in rectum and the treatment would be to continue fleet enemas, give Mg Citrate today, continue laxatives.   4. 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  5. Chronic atrial fibrillation / acquired thrombophilia - rates suboptimally controlled but tolerable given acute situation.  Continue  amiodarone and diltiazem at current dosing.  Warfarin for anticoagulation. Monitor PT.  Pharm D assisting with warfarin dosing.   6. Hypothyroidism - stable on home levothyroxine.  7. ABLA - stable Hg. Following intermittently with CBC testing.    DVT prophylaxis: warfarin  Code Status: full  Family Communication: bedside update 3/4 Disposition: CIR  Status is: Inpatient  Remains inpatient appropriate because:IV treatments appropriate due to intensity of illness or inability to take PO and Inpatient level of care appropriate due to severity of illness  Dispo: The patient is from: Home              Anticipated d/c is to: CIR              Patient currently is medically stable to d/c.   Difficult to place patient Yes   Consultants:   CCS - s/o, reconsult 3/5 s/o   PCCM - s/o  Nephrology - s/o  ID - s/o  Procedures:   Irrigation and debridement of abscesses 1/10, 1/11  Antimicrobials:  augmentin oral therapy   Subjective: Pt continues to complain of feeling stool in rectal vault and crampy colicky abdominal pain with flatus.    Objective: Vitals:   08/02/20 1352 08/02/20 2021 08/03/20 0454 08/03/20 1518  BP: 111/64 122/76 132/70 (!) 108/53  Pulse: (!) 105 (!) 108 (!) 108 100  Resp: 19 18 18 18   Temp: 98 F (36.7 C) 98.4 F (36.9 C) 97.8 F (36.6 C) 98.3 F (36.8 C)  TempSrc: Oral Axillary Oral Oral  SpO2: 92% 100% 99% 96%  Weight:      Height:        Intake/Output Summary (Last 24 hours) at 08/03/2020 1533 Last data filed at 08/03/2020 1330 Gross per 24 hour  Intake 1503 ml  Output 2200 ml  Net -697 ml   Filed Weights   07/28/20 0500 08/01/20 0500 08/02/20 0500  Weight: (!) 246.8 kg (!) 254.8 kg (!) 252.1 kg    Examination:  General exam: Morbidly obese male, lying on stomach, Appears agitated.  Respiratory system: Shallow BS, no crackles or rales heard.  Cardiovascular system: normal S1 & S2 heard. No JVD, murmurs, rubs, gallops or clicks. 2+ pedal  edema. Gastrointestinal system: Abdomen is nondistended, soft and diffusely tender. No organomegaly or masses felt. Normal bowel sounds heard. Central nervous system: Alert and oriented. No focal neurological deficits. Extremities: 1+ pretibial edema BLEs. Symmetric 5 x 5 power. Skin: perineal and buttock wounds slowly healing.  Psychiatry: Judgement and insight appear poor. Mood & affect appropriate.   Data Reviewed: I have personally reviewed following labs and imaging studies  CBC: Recent Labs  Lab 07/29/20 0036 08/01/20 0437 08/03/20 0139  WBC 6.2 6.4 7.2  HGB 8.9* 9.2* 8.9*  HCT 28.3* 30.5* 30.4*  MCV 86.8 87.4 87.4  PLT 309 307 322    Basic Metabolic Panel: Recent Labs  Lab 07/29/20 0036 08/03/20 0139 08/03/20 0835  NA 134* 135  --   K 3.4* 3.3*  --   CL 95* 96*  --   CO2 26 31  --   GLUCOSE 141* 128*  --   BUN 10 7  --   CREATININE 0.85 0.92  --   CALCIUM 8.3* 8.2*  --   MG  --   --  1.7    GFR: Estimated Creatinine Clearance: 200.8 mL/min (by C-G formula based on SCr of 0.92 mg/dL).  Liver Function Tests: Recent Labs  Lab 08/03/20 0139  AST 23  ALT 17  ALKPHOS 86  BILITOT 0.5  PROT 6.3*  ALBUMIN 2.0*    CBG: Recent Labs  Lab 08/02/20 1151 08/02/20 1727 08/02/20 2027 08/03/20 0812 08/03/20 1140  GLUCAP 175* 145* 131* 204* 165*    No results found for this or any previous visit (from the past 240 hour(s)).   Radiology Studies: CT ABDOMEN PELVIS WO CONTRAST  Result Date: 08/02/2020 CLINICAL DATA:  Low abdominal and pelvic pain after attempted flexiseal placement EXAM: CT ABDOMEN AND PELVIS WITHOUT CONTRAST TECHNIQUE: Multidetector CT imaging of the abdomen and pelvis was performed following the standard protocol without IV contrast. COMPARISON:  06/22/2020 FINDINGS: Lower chest: Trace left pleural effusion with left basilar atelectasis. Right lung base is clear. Heart size within normal limits. Hepatobiliary: Unremarkable unenhanced appearance  of the liver. No focal liver lesion identified. Gallbladder within normal limits. No hyperdense gallstone. No biliary dilatation. Pancreas: Unremarkable. No pancreatic ductal dilatation or surrounding inflammatory changes. Spleen: Normal in size without focal abnormality. Adrenals/Urinary Tract: Unremarkable adrenal glands. Tiny 1-2 mm calcification within the interpolar region of the right kidney which may reflect a nonobstructing stone. No hydronephrosis. Urinary bladder is decompressed by Foley catheter. Stomach/Bowel: Postoperative changes of sleeve gastrectomy. Stomach otherwise within normal limits. No dilated loops of small bowel. Moderate-large volume of stool throughout the colon. Normal appendix in the right lower quadrant. No focal bowel wall thickening or inflammatory changes. Vascular/Lymphatic: Scattered aortoiliac atherosclerotic calcifications without aneurysm. No abdominopelvic lymphadenopathy. Reproductive: Unremarkable. Other: No free fluid. No abdominopelvic fluid collection. No pneumoperitoneum. No abdominal wall hernia. Musculoskeletal: Mild diffuse  anasarca, most pronounced at the left lateral abdominal wall. Probable injection related changes within the subcutaneous soft tissues of the lower anterior abdominal wall. No fluid collection or soft tissue gas. No acute osseous findings. IMPRESSION: 1. No acute abdominopelvic findings. Specifically, no evidence of bowel perforation. 2. Moderate-large volume of stool throughout the colon. 3. Trace left pleural effusion with left basilar atelectasis. 4. Tiny nonobstructing right renal stone. Aortic Atherosclerosis (ICD10-I70.0). Electronically Signed   By: Duanne Guess D.O.   On: 08/02/2020 10:40   Scheduled Meds: . amiodarone  200 mg Oral Daily  . amoxicillin-clavulanate  1 tablet Oral Q8H  . atorvastatin  20 mg Oral QPM  . chlorhexidine  15 mL Mouth Rinse BID  . Chlorhexidine Gluconate Cloth  6 each Topical Daily  . diltiazem  240 mg  Oral Daily  . docusate sodium  100 mg Oral BID  . feeding supplement  237 mL Oral BID BM  . insulin aspart  0-20 Units Subcutaneous TID WC  . insulin aspart  0-5 Units Subcutaneous QHS  . insulin aspart  18 Units Subcutaneous TID WC  . insulin detemir  42 Units Subcutaneous BID  . levothyroxine  75 mcg Oral Daily  . magnesium citrate  1 Bottle Oral Once  . mouth rinse  15 mL Mouth Rinse q12n4p  . multivitamin with minerals  1 tablet Oral Daily  . nortriptyline  50 mg Oral BID  . oxyCODONE  20 mg Oral Q12H  . pantoprazole  40 mg Oral Daily  . polyethylene glycol  17 g Oral BID  . potassium chloride  20 mEq Oral Q4H  . pregabalin  300 mg Oral BID  . Ensure Max Protein  11 oz Oral QHS  . saccharomyces boulardii  250 mg Oral BID  . sodium chloride flush  3 mL Intravenous Q12H  . sodium phosphate  1 enema Rectal Once  . warfarin  2.5 mg Oral q1600  . Warfarin - Pharmacist Dosing Inpatient   Does not apply q1600   Continuous Infusions: . sodium chloride Stopped (06/28/20 2035)     LOS: 54 days   Time spent: 40 mins   Clanford Laural Benes, MD How to contact the [facility] Attending or Consulting provider 7A - 7P or covering provider during after hours 7P -7A, for this patient?  1. Check the care team in [facility] and look for a) attending/consulting TRH provider listed and b) the [facility] team listed 2. Log into [email or web address] and use [place]'s universal password to access. If you do not have the password, please contact the hospital operator. 3. Locate the [facility] provider you are looking for under [facility] and page to a number that you can be directly reached. 4. If you still have difficulty reaching the provider, please page the [facility] (Director on Call) for the Hospitalists listed on amion for assistance.  08/03/2020, 3:33 PM

## 2020-08-03 NOTE — Progress Notes (Signed)
Patient wanted to wait on mag citrate and enema. He opted to take the scheduled miralax and colace along with prune juice to see if that would get his bowels moving.

## 2020-08-03 NOTE — Progress Notes (Signed)
ANTICOAGULATION CONSULT NOTE  Pharmacy Consult for Warfarin  Indication: atrial fibrillation  Patient Measurements: Height: 5\' 10"  (177.8 cm) Weight: (!) 252.1 kg (555 lb 12.5 oz) IBW/kg (Calculated) : 73  Vital Signs: Temp: 97.8 F (36.6 C) (03/05 0454) Temp Source: Oral (03/05 0454) BP: 132/70 (03/05 0454) Pulse Rate: 108 (03/05 0454)  Labs: Recent Labs    08/01/20 0437 08/03/20 0139  HGB 9.2* 8.9*  HCT 30.5* 30.4*  PLT 307 322  LABPROT 31.5*  --   INR 3.2*  --   CREATININE  --  0.92    Estimated Creatinine Clearance: 200.8 mL/min (by C-G formula based on SCr of 0.92 mg/dL).   Assessment: 49YOM admitted with septic shock secondary to necrotizing fascitis. Was on Xarelto PTA but not appropriate based on patient's weight. Pharmacy consulted to dose warfarin.  Patient sensitive to warfarin in setting of drug interactions with amiodarone and Augmentin (has been on these for some time). Patient intake 100% with every meal. INR slightly supratherapeutic at 3.2 (on 3/3) on warfarin 3mg  daily. Dose reduced to 2.5 mg daily on 3/4. CBC stable. No bleeding issues reported.  Goal of Therapy:  INR 2-3 Monitor platelets by anticoagulation protocol: Yes  Plan:  Continue warfarin 2.5mg  PO daily F/u QMon/Thur INR and weekly CBC Monitor for s/sx of bleeding   10/03/20, PharmD, BCPS PGY2 Pharmacy Resident Phone between 7 am - 3:30 pm:  Please check AMION for all [facility] Pharmacy phone numbers After 10:00 PM, call Main Pharmacy [phone]  08/03/2020 8:09 AM

## 2020-08-04 DIAGNOSIS — N493 Fournier gangrene: Secondary | ICD-10-CM | POA: Diagnosis not present

## 2020-08-04 DIAGNOSIS — E039 Hypothyroidism, unspecified: Secondary | ICD-10-CM | POA: Diagnosis not present

## 2020-08-04 DIAGNOSIS — J9601 Acute respiratory failure with hypoxia: Secondary | ICD-10-CM | POA: Diagnosis not present

## 2020-08-04 DIAGNOSIS — A429 Actinomycosis, unspecified: Secondary | ICD-10-CM | POA: Diagnosis not present

## 2020-08-04 LAB — BASIC METABOLIC PANEL
Anion gap: 12 (ref 5–15)
BUN: 6 mg/dL (ref 6–20)
CO2: 27 mmol/L (ref 22–32)
Calcium: 8.3 mg/dL — ABNORMAL LOW (ref 8.9–10.3)
Chloride: 96 mmol/L — ABNORMAL LOW (ref 98–111)
Creatinine, Ser: 0.79 mg/dL (ref 0.61–1.24)
GFR, Estimated: >60 mL/min (ref >60)
Glucose, Bld: 146 mg/dL — ABNORMAL HIGH (ref 70–99)
Potassium: 3.2 mmol/L — ABNORMAL LOW (ref 3.5–5.1)
Sodium: 135 mmol/L (ref 135–145)

## 2020-08-04 LAB — GLUCOSE, CAPILLARY
Glucose-Capillary: 123 mg/dL — ABNORMAL HIGH (ref 70–99)
Glucose-Capillary: 138 mg/dL — ABNORMAL HIGH (ref 70–99)
Glucose-Capillary: 190 mg/dL — ABNORMAL HIGH (ref 70–99)
Glucose-Capillary: 162 mg/dL — ABNORMAL HIGH (ref 70–99)

## 2020-08-04 LAB — MAGNESIUM: Magnesium: 1.8 mg/dL (ref 1.7–2.4)

## 2020-08-04 MED ORDER — SORBITOL 70 % SOLN
960.0000 mL | TOPICAL_OIL | Freq: Once | ORAL | Status: AC
  Administered 2020-08-04: 960 mL via RECTAL
  Filled 2020-08-04: qty 473

## 2020-08-04 MED ORDER — MAGNESIUM SULFATE 2 GM/50ML IV SOLN
2.0000 g | Freq: Once | INTRAVENOUS | Status: AC
  Administered 2020-08-04: 2 g via INTRAVENOUS
  Filled 2020-08-04: qty 50

## 2020-08-04 MED ORDER — OXYCODONE HCL 5 MG PO TABS
10.0000 mg | ORAL_TABLET | ORAL | Status: DC | PRN
  Administered 2020-08-04 – 2020-08-05 (×2): 10 mg via ORAL
  Filled 2020-08-04 (×2): qty 2

## 2020-08-04 MED ORDER — HYDROMORPHONE HCL 1 MG/ML IJ SOLN
0.5000 mg | Freq: Three times a day (TID) | INTRAMUSCULAR | Status: DC | PRN
  Administered 2020-08-04 – 2020-08-05 (×2): 0.75 mg via INTRAVENOUS
  Filled 2020-08-04 (×3): qty 1

## 2020-08-04 MED ORDER — MAGNESIUM CITRATE PO SOLN
1.0000 | Freq: Once | ORAL | Status: AC
  Administered 2020-08-04: 1 via ORAL
  Filled 2020-08-04: qty 296

## 2020-08-04 MED ORDER — POTASSIUM CHLORIDE CRYS ER 20 MEQ PO TBCR
60.0000 meq | EXTENDED_RELEASE_TABLET | Freq: Once | ORAL | Status: AC
  Administered 2020-08-04: 60 meq via ORAL
  Filled 2020-08-04: qty 3

## 2020-08-04 NOTE — Progress Notes (Signed)
another dose of PRN Lactulose was administered. still no BM noted. Paged Audrea Muscat. New order received (Mag Citrate at 0800). Sacral dressing was changed. Call bell  within reach.

## 2020-08-04 NOTE — Progress Notes (Signed)
Patient still has c/o constipation after MagCitrate was given @1902 ,  then administered Fleet enema as ordered, and most of the liquid med came out. Also, offered warm prune juice & PRN lactulose. Paged Xenia,Blount NP and awaiting for call back/new order. Call bell within reach and will continue to monitor.

## 2020-08-04 NOTE — Progress Notes (Signed)
NP message back through secure chat, no new orders at this time. Will continue to close monitor patient.

## 2020-08-04 NOTE — Progress Notes (Signed)
PROGRESS NOTE   Carl Hill  SEG:[id number] DOB: 11-Oct-1970 DOA: 06/10/2020 PCP: Malka So., MD   Chief Complaint  Patient presents with  . Hyperglycemia   Level of care: Telemetry Medical  Brief Admission History:  50 y.o. male with medical history significant of morbid obesity; HTN; DM with foot ulcers; and afib presenting with hyperglycemia.  He was found to have a gluteal abscesses and cellulitis, sepsis with septic shock, treated in the OR with irrigation and debridement followed by a prolonged complicated hospital course.   Assessment & Plan:   Principal Problem:   Fournier gangrene Active Problems:   Sepsis (HCC)   DKA (diabetic ketoacidosis) (HCC)   AKI (acute kidney injury) (HCC)   Diabetic ulcer of heel (HCC)   Atrial fibrillation, chronic (HCC)   Essential hypertension   Dyslipidemia   Acquired hypothyroidism   Chronic pain disorder   Morbid obesity with BMI of 60.0-69.9, adult (HCC)   Acute respiratory failure (HCC)   Sepsis due to Streptococcus, group B (HCC)   Actinomycosis   1. Sepsis with septic shock - RESOLVED - secondary to necrotizing fasciitis with E coli, GBS and peptostreptocossus. ID consulted and recommended Unasyn followed by 6 month course of oral augmentin due to actinomyces in wound.  2. Acute on chronic hypercapneic respiratory faiilure, OHS, HCAP - Pneumonia has been fully treated and resolve. Pt now s/p tracheostomy which is now out.   3. Opioid induced Chronic constipation with fecal impaction - Pt remains severely constipated.  I reached out again to CCS attending team on 3/6 and they agreed to see him today.  They recommend continued laxatives and enema therapy until stool ball loosens up and passes.  Another dose of Mg citrate and another enema ordered today as well.  Surgery says he may benefit from gastrograffin enema.  I tried but was unable to manually disimpact him at bedside.  He may benefit from something like Movantic if he  continues to require high dose opioids for pain mgmt.  4. 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  5. Chronic atrial fibrillation / acquired thrombophilia - heart rates suboptimally controlled but tolerable given his acute situation.  Continue amiodarone and diltiazem at current dosing.  Warfarin for anticoagulation. Monitor PT.  Pharm D assisting with warfarin dosing.  Outpatient follow up with cardiology recommended.  6. Hypothyroidism - stable on home levothyroxine.  7. ABLA - stable Hg. Following intermittently with CBC testing.    DVT prophylaxis: warfarin  Code Status: full  Family Communication: bedside update 3/4 Disposition: CIR  Status is: Inpatient  Remains inpatient appropriate because:IV treatments appropriate due to intensity of illness or inability to take PO and Inpatient level of care appropriate due to severity of illness  Dispo: The patient is from: Home              Anticipated d/c is to: CIR              Patient currently is medically stable to d/c.   Difficult to place patient Yes   Consultants:   CCS - s/o, reconsulted 3/5 and 3/6  PCCM - s/o  Nephrology - s/o  ID - s/o  Procedures:   Irrigation and debridement of abscesses 1/10, 1/11  Antimicrobials:  augmentin oral therapy   Subjective: Pt complains of anal pain, no BM, all laxatives have failed.     Objective: Vitals:   08/03/20 1518 08/03/20  2053 08/04/20 0500 08/04/20 0511  BP: (!) 108/53 (!) 104/52  131/63  Pulse: 100 (!) 102  (!) 108  Resp: 18 19  18   Temp: 98.3 F (36.8 C) 98 F (36.7 C)  97.9 F (36.6 C)  TempSrc: Oral   Oral  SpO2: 96% 96%  96%  Weight:   (!) 250.7 kg   Height:        Intake/Output Summary (Last 24 hours) at 08/04/2020 1119 Last data filed at 08/04/2020 0550 Gross per 24 hour  Intake 390 ml  Output 5200 ml  Net -4810 ml   Filed Weights   08/01/20 0500 08/02/20 0500 08/04/20  0500  Weight: (!) 254.8 kg (!) 252.1 kg (!) 250.7 kg    Examination:  General exam: Morbidly obese male, lying on stomach, Appears agitated.  Respiratory system: Shallow BS, no crackles or rales heard.  Cardiovascular system: normal S1 & S2 heard. No JVD, murmurs, rubs, gallops or clicks. 2+ pedal edema. Gastrointestinal system: Abdomen is nondistended, soft and diffusely tender. No organomegaly or masses felt. Normal bowel sounds heard. Central nervous system: Alert and oriented. No focal neurological deficits. Extremities: 1+ pretibial edema BLEs. Symmetric 5 x 5 power. Skin: perineal and buttock wounds slowly healing.  Psychiatry: Judgement and insight appear poor. Mood & affect appropriate.   Data Reviewed: I have personally reviewed following labs and imaging studies  CBC: Recent Labs  Lab 07/29/20 0036 08/01/20 0437 08/03/20 0139  WBC 6.2 6.4 7.2  HGB 8.9* 9.2* 8.9*  HCT 28.3* 30.5* 30.4*  MCV 86.8 87.4 87.4  PLT 309 307 322    Basic Metabolic Panel: Recent Labs  Lab 07/29/20 0036 08/03/20 0139 08/03/20 0835 08/04/20 0039  NA 134* 135  --  135  K 3.4* 3.3*  --  3.2*  CL 95* 96*  --  96*  CO2 26 31  --  27  GLUCOSE 141* 128*  --  146*  BUN 10 7  --  6  CREATININE 0.85 0.92  --  0.79  CALCIUM 8.3* 8.2*  --  8.3*  MG  --   --  1.7 1.8    GFR: Estimated Creatinine Clearance: 230.2 mL/min (by C-G formula based on SCr of 0.79 mg/dL).  Liver Function Tests: Recent Labs  Lab 08/03/20 0139  AST 23  ALT 17  ALKPHOS 86  BILITOT 0.5  PROT 6.3*  ALBUMIN 2.0*    CBG: Recent Labs  Lab 08/03/20 0812 08/03/20 1140 08/03/20 1658 08/03/20 2052 08/04/20 0729  GLUCAP 204* 165* 161* 116* 123*    No results found for this or any previous visit (from the past 240 hour(s)).   Radiology Studies: No results found. Scheduled Meds: . amiodarone  200 mg Oral Daily  . amoxicillin-clavulanate  1 tablet Oral Q8H  . atorvastatin  20 mg Oral QPM  . chlorhexidine   15 mL Mouth Rinse BID  . Chlorhexidine Gluconate Cloth  6 each Topical Daily  . diltiazem  240 mg Oral Daily  . docusate sodium  100 mg Oral BID  . feeding supplement  237 mL Oral BID BM  . insulin aspart  0-20 Units Subcutaneous TID WC  . insulin aspart  0-5 Units Subcutaneous QHS  . insulin aspart  18 Units Subcutaneous TID WC  . insulin detemir  42 Units Subcutaneous BID  . levothyroxine  75 mcg Oral Daily  . mouth rinse  15 mL Mouth Rinse q12n4p  . multivitamin with minerals  1 tablet Oral Daily  .  nortriptyline  50 mg Oral BID  . oxyCODONE  20 mg Oral Q12H  . pantoprazole  40 mg Oral Daily  . polyethylene glycol  17 g Oral BID  . pregabalin  300 mg Oral BID  . Ensure Max Protein  11 oz Oral QHS  . saccharomyces boulardii  250 mg Oral BID  . sodium chloride flush  3 mL Intravenous Q12H  . sorbitol, milk of mag, mineral oil, glycerin (SMOG) enema  960 mL Rectal Once  . warfarin  2.5 mg Oral q1600  . Warfarin - Pharmacist Dosing Inpatient   Does not apply q1600   Continuous Infusions: . sodium chloride Stopped (06/28/20 2035)  . magnesium sulfate bolus IVPB       LOS: 55 days   Time spent: 38 mins   Clanford Laural Benes, MD How to contact the [facility] Attending or Consulting provider 7A - 7P or covering provider during after hours 7P -7A, for this patient?  1. Check the care team in [facility] and look for a) attending/consulting TRH provider listed and b) the [facility] team listed 2. Log into [email or web address] and use [place]'s universal password to access. If you do not have the password, please contact the hospital operator. 3. Locate the [facility] provider you are looking for under [facility] and page to a number that you can be directly reached. 4. If you still have difficulty reaching the provider, please page the [facility] (Director on Call) for the Hospitalists listed on amion for assistance.  08/04/2020, 11:19 AM

## 2020-08-04 NOTE — Progress Notes (Signed)
ANTICOAGULATION CONSULT NOTE  Pharmacy Consult for Warfarin  Indication: atrial fibrillation  Patient Measurements: Height: 5\' 10"  (177.8 cm) Weight: (!) 250.7 kg (552 lb 11.1 oz) IBW/kg (Calculated) : 73  Vital Signs: Temp: 97.9 F (36.6 C) (03/06 0511) Temp Source: Oral (03/06 0511) BP: 131/63 (03/06 0511) Pulse Rate: 108 (03/06 0511)  Labs: Recent Labs    08/03/20 0139 08/04/20 0039  HGB 8.9*  --   HCT 30.4*  --   PLT 322  --   CREATININE 0.92 0.79    Estimated Creatinine Clearance: 230.2 mL/min (by C-G formula based on SCr of 0.79 mg/dL).   Assessment: 49YOM admitted with septic shock secondary to necrotizing fascitis. Was on Xarelto PTA but not appropriate based on patient's weight. Pharmacy consulted to dose warfarin.  Patient sensitive to warfarin in setting of drug interactions with amiodarone and Augmentin (has been on these for some time). Patient intake 100% with every meal. INR slightly supratherapeutic at 3.2 (on 3/3) on warfarin 3mg  daily. Dose reduced to 2.5 mg daily on 3/4. CBC stable. No bleeding issues reported.  Goal of Therapy:  INR 2-3 Monitor platelets by anticoagulation protocol: Yes  Plan:  Continue warfarin 2.5mg  PO daily F/u QMon/Thur INR and weekly CBC Monitor for s/sx of bleeding   10/04/20, PharmD, BCPS PGY2 Pharmacy Resident Phone between 7 am - 3:30 pm:  Please check AMION for all [facility] Pharmacy phone numbers After 10:00 PM, call Main Pharmacy [phone]  08/04/2020 7:55 AM

## 2020-08-05 ENCOUNTER — Encounter (HOSPITAL_COMMUNITY): Payer: Self-pay | Admitting: Pulmonary Disease

## 2020-08-05 DIAGNOSIS — N493 Fournier gangrene: Secondary | ICD-10-CM | POA: Diagnosis not present

## 2020-08-05 DIAGNOSIS — J9601 Acute respiratory failure with hypoxia: Secondary | ICD-10-CM | POA: Diagnosis not present

## 2020-08-05 DIAGNOSIS — E039 Hypothyroidism, unspecified: Secondary | ICD-10-CM | POA: Diagnosis not present

## 2020-08-05 DIAGNOSIS — A429 Actinomycosis, unspecified: Secondary | ICD-10-CM | POA: Diagnosis not present

## 2020-08-05 LAB — BASIC METABOLIC PANEL
Anion gap: 11 (ref 5–15)
BUN: 9 mg/dL (ref 6–20)
CO2: 29 mmol/L (ref 22–32)
Calcium: 8.3 mg/dL — ABNORMAL LOW (ref 8.9–10.3)
Chloride: 93 mmol/L — ABNORMAL LOW (ref 98–111)
Creatinine, Ser: 1.04 mg/dL (ref 0.61–1.24)
GFR, Estimated: >60 mL/min (ref >60)
Glucose, Bld: 132 mg/dL — ABNORMAL HIGH (ref 70–99)
Potassium: 3.6 mmol/L (ref 3.5–5.1)
Sodium: 133 mmol/L — ABNORMAL LOW (ref 135–145)

## 2020-08-05 LAB — PROTIME-INR
INR: 3.1 — ABNORMAL HIGH (ref 0.8–1.2)
Prothrombin Time: 31.3 seconds — ABNORMAL HIGH (ref 11.4–15.2)

## 2020-08-05 LAB — GLUCOSE, CAPILLARY
Glucose-Capillary: 184 mg/dL — ABNORMAL HIGH (ref 70–99)
Glucose-Capillary: 182 mg/dL — ABNORMAL HIGH (ref 70–99)
Glucose-Capillary: 153 mg/dL — ABNORMAL HIGH (ref 70–99)
Glucose-Capillary: 109 mg/dL — ABNORMAL HIGH (ref 70–99)

## 2020-08-05 LAB — C-REACTIVE PROTEIN: CRP: 9.3 mg/dL — ABNORMAL HIGH (ref <1.0)

## 2020-08-05 LAB — MAGNESIUM: Magnesium: 2.5 mg/dL — ABNORMAL HIGH (ref 1.7–2.4)

## 2020-08-05 MED ORDER — OXYCODONE HCL 10 MG PO TABS: 10.0000 mg | ORAL_TABLET | ORAL | 0 refills | Status: DC | PRN

## 2020-08-05 MED ORDER — POLYETHYLENE GLYCOL 3350 17 G PO PACK: 17.0000 g | PACK | Freq: Every day | ORAL | Status: DC

## 2020-08-05 MED ORDER — POLYETHYLENE GLYCOL 3350 17 G PO PACK: 17.0000 g | PACK | Freq: Every day | ORAL | 0 refills | Status: DC

## 2020-08-05 MED ORDER — HYDROMORPHONE HCL 1 MG/ML IJ SOLN: 1.0000 mg | INTRAMUSCULAR | 0 refills | Status: DC | PRN

## 2020-08-05 MED ORDER — WARFARIN SODIUM 2 MG PO TABS
2.0000 mg | ORAL_TABLET | Freq: Every day | ORAL | Status: DC
  Administered 2020-08-05: 2 mg via ORAL
  Filled 2020-08-05: qty 1

## 2020-08-05 MED ORDER — SACCHAROMYCES BOULARDII 250 MG PO CAPS: 250.0000 mg | ORAL_CAPSULE | Freq: Two times a day (BID) | ORAL | Status: DC

## 2020-08-05 MED ORDER — WARFARIN SODIUM 2 MG PO TABS: 2.0000 mg | ORAL_TABLET | Freq: Every day | ORAL | Status: DC

## 2020-08-05 NOTE — Progress Notes (Signed)
PT Cancellation Note  Patient Details Name: Carl Hill MRN: [id number] DOB: January 22, 1971   Cancelled Treatment:    Reason Eval/Treat Not Completed: Other (comment).  Checked on pt x 2 today, once this AM and he was getting cleaned up and re-dressed after a liquid stool.  We started to get ready to mobilize and he started having another BM, soiling his clean dressing.  I assisted in cleaning him up and let the RN know he needed a new dressing.   I came back at 1445 and pt did not have a dressing (RN reports she did not want to keep putting on new dressings if he was going to keep having a BM).  So, without a dressing we did not feel it was safe to sit or stand at EOB.  Pt is due to d/c to CIR this PM, so I assisted pt in packing his things.    Thanks,  Corinna Capra, PT, DPT  Acute Rehabilitation [phone] pager #[phone] office       Lurena Joiner B [place] 08/05/2020, 3:25 PM

## 2020-08-05 NOTE — Discharge Summary (Signed)
Physician Discharge Summary  Carl Hill MCN:[id number] DOB: [birth year] DOA: 06/10/2020  PCP: Malka So., MD  Admit date: 06/10/2020 Discharge date: 08/05/2020  Disposition:  C.I.R.   Recommendations for Outpatient Follow-up:  1. Please monitor PT/INR with pharm D to assist with warfarin dosing 2. Please monitor CBG TIDAC and PRN if not feeling well.  3. IV dilaudid only for dressing change or turning.  4. Please monitor CBC and BMP weekly.   5. Continue Vaseline gauze dressing covered by dry dressing over trach site until site closed   Discharge Condition: STABLE   CODE STATUS: FULL  DIET: carb modified, no concentrated sweets or fruit juices except to treat a low blood glucose  Brief Hospitalization Summary: Please see all hospital notes, images, labs for full details of the hospitalization. 50 year old male with history of morbid obesity, hypertension, diabetes with peripheral lower extremity neuropathy, atrial fibrillation.  He initially presented to the ER on 1/10 with reports of hyperglycemia with glucose greater than 406.  He was symptomatic with dizziness lightheadedness and headache.  He reports chronic lower extremity wounds on the left heel the recent left calf skin tear.  In route to the hospital he sustained a skin tear of his right calf.  His mother had also noticed a boil on his buttocks on Thursday.  Patient at baseline is ambulatory with a cane but due to current illness mother has been assisting with his care including pericare.  On exam in the ER patient was found to have what appeared to be significant necrotizing soft tissue infection/injury to the buttocks, perineum and upper thigh regions.  Surgery was urgently consulted and patient was taken to the OR.  See below regarding other significant events.  Since that time patient has transitioned out of the ICU initially to the progressive care unit and as of the past 24 hours to 6 N. surgical unit.  Patient had been  stable on a trach collar and accidentally decannulated himself this morning.  PCCM was notified and recommended leaving the trach out and patient has done well since that time.  Patient had been undergoing aggressive wound care to include hydrotherapy with significant improvement in the wound base and resolution of necrotic and other debris.  He is now tolerating saline packing wound dressings to all of his wounds twice daily.  He continues to have significant pain in the decubitus area as well as worsening neuropathy pain in both feet as PT has worked with him and he is starting to do some partial weightbearing.  Patient very talkative and animated today on 2/25 and is very eager to continue therapy and eventually return home.  Significant Hospital events: 1/10 Admitted to [facility] with sepsis, necrotizing fasciitis. General surgery consulted and debridement pursued. In the afternoon, patient developed hypotension with AF in RVR. Synchronized cardioversion unsuccessful with conversion to VT. CPR initated with ROSC after desynchronized defibrillation. 1/11 Debridement 1/18 Mucus plugging 1/19 Bronchoscopy >> mucus plug on Rt, airway collapse with exhalation 1/20 Persistent fever, change ABx 1/24 Increased vent requirements, habitus related +/- mucus plugging 1/25 Hypotensive to SBP 80s, Levo resumed, minimally responsive overnight off sedation, CT Head negative 1/26 Stable vent/pressor requirements, bronched, tracheostomy 1/29 start hydrotherapy 1/30 start lovenox/coumadin 2/2 Tolerating PS 2/4 did TC for 12 hours 2/5 Did 24 hours TC 2/6 Continue TC  2/8 downgrade to progressive 2/15 trach changed to # 6 cuffless  2/25 Self decannulated-trach left out 3/4-3/6 severe opioid induced constipation with fecal impaction, treated with laxatives  and enemas  Discharge Diagnoses:  Principal Problem:   Fournier gangrene Active Problems:   Sepsis (HCC)   DKA (diabetic ketoacidosis) (HCC)   AKI (acute  kidney injury) (HCC)   Diabetic ulcer of heel (HCC)   Atrial fibrillation, chronic (HCC)   Essential hypertension   Dyslipidemia   Acquired hypothyroidism   Chronic pain disorder   Morbid obesity with BMI of 60.0-69.9, adult (HCC)   Acute respiratory failure (HCC)   Sepsis due to Streptococcus, group B (HCC)   Actinomycosis  Acute onChronicHypoxic andHypercapnicRespiratoryFailure in the setting ofSepsis,Morbid obesity, healthcare associated pneumonia, recurrent mucous plugging; -Sepsis physiology has resolved and PNA adequately treated -Prolonged respiratory failure requiring mechanical ventilation, failed attempt at extubation. -Status post tracheostomy on 1/26, wean off ventilator.  With subsequent downgrade to cuffless #6 trach on 2/15.  As of 2/25 patient accidentally self decannulated and decision made to leave tracheostomy tube out -Continue Vaseline gauze dressing covered by dry dressing over trach site until site closed -Once trach site closed patient will likely benefit from CPAP to use at bedtime and as needed given likelihood of underlying sleep apnea  Sepsis 2/2 Left buttock necrotizing fasciitis with E. coli, group B strep, Peptostreptococcus in wound culture:  SEPSIS RESOLVED -Excisional debridement of perianal, gluteal and perineal necrotizing soft tissue infection by Dr.Allen1/10 -Back toOR 1/11 and excisional debridement of perineal necrosis, by Dr. Freida BusmanAllen. -Received hydrotherapy from 06/29/2020 until2/07/2020. -No further debridement. General surgery recommended wound care RN follow  -Seen by infectious disease who recommended Unasyn followed by Augmentin for 1376-month course due to actinomyces in the wound. Patient currently on Augmentin. -Added florastor 250 mg BID.   -Continue with wound care and dressing changes.  -Flexi-Seal in place to minimize fecal contamination of wound.    Abdominal pain secondary to severe constipation - RESOLVED Noncontrasted CT  of the abdomen and pelvis revealed no evidence of free air or perforation but significant amount of stool in the colon including marked amount of stool in the rectum which likely explains patient's symptoms with placement of Flexi-Seal Pt treated with laxatives and enemas for relive. I discussed with surgery team over weekend.   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 Fib/acquired thrombophilia: VR Controlled.  Continue with amiodarone and diltiazem. Preadmission Xarelto discontinued per pharmacy recommendation given patient's body weight greater than 500 pounds Continue with Coumadin-INR therapeutic at 2.5  Physical deconditioning Markedly improved now sitting on edge of bed for the first time in greater than 43 days CIR admission planned for 08/05/20.   Diarrhea:  Has rectal tube to protect the wound. Patient reporting abdominal cramping regularly with use of laxatives for have discontinued Senokot and have changed MiraLAX to scheduled at bedtime Patient does have issues sometimes with stool becoming dry and hard and benefits from flushing of Flexi-Seal so order placed on 2/25  Pressure injury present on admission in context necrotizing skin infection/injury (Coccyx/sacrum/buttocks/perinuem/pretibial See documentation from wound care nurse     Incision (Closed) 06/10/20 Coccyx Other (Comment) (Active)  Date First Assessed/Time First Assessed: 06/10/20 1250   Location: Coccyx  Location Orientation: Other (Comment)    Assessments 06/10/2020  2:30 PM 07/31/2020  8:00 AM  Dressing Type Gauze (Comment) --  Dressing Change Frequency -- Twice a day  Site / Wound Assessment -- Dressing in  place / Unable to assess     No Linked orders to display     Wound /  Incision (Open or Dehisced) 06/10/20 Skin tear Pretibial Right;Anterior (Active)  Date First Assessed/Time First Assessed: 06/10/20 1400   Wound Type: Skin tear  Location: Pretibial  Location Orientation: Right;Anterior  Present on Admission: Yes    Assessments 06/10/2020  2:30 PM 07/31/2020  8:00 AM  Dressing Type Petroleum --  Dressing Changed Changed --  Dressing Status Clean;Dry;Intact --  Dressing Change Frequency -- Twice a day     No Linked orders to display     Pressure Injury 06/10/20 Heel Right Stage 2 -  Partial thickness loss of dermis presenting as a shallow open injury with a red, pink wound bed without slough. (Active)  Date First Assessed/Time First Assessed: 06/10/20 1400   Location: Heel  Location Orientation: Right  Staging: Stage 2 -  Partial thickness loss of dermis presenting as a shallow open injury with a red, pink wound bed without slough.  Present on Admis...   Nutrition Status: Nutrition Problem: Increased nutrient needs Etiology: wound healing Signs/Symptoms: estimated needs Interventions: Ensure Enlive (each supplement provides 350kcal and 20 grams of protein),MVI,Premier Protein,Magic cup Estimated body mass index is 79.75 kg/m as calculated from the following:   Height as of this encounter:  (1.778 m).   Weight as of this encounter: 252.1 kg.  Continue Ensure Plus, multiple vitamins  hypernatremia - RESOLVED from diuresis and insensible losses: Resolved  Acute blood loss anemia  from blood loss fromwound:Status post blood transfusion. Hbstable  Hypothyroidism: Continue with Synthroid    Discharge Instructions:  Allergies as of 08/05/2020      Reactions   Bee Venom Anaphylaxis   Other reaction(s): Unknown Other reaction(s): Unknown Other reaction(s): Unknown   Sglt2 Inhibitors Other (See Comments)   Necrotizing infection of the perineum   Codeine Other (See Comments)   Pt states that his mother told him he is allergic, but has  taken this medication multiple ties with no problems.    Other Hives      Medication List    STOP taking these medications   albuterol 108 (90 Base) MCG/ACT inhaler Commonly known as: VENTOLIN HFA Replaced by: albuterol (2.5 MG/3ML) 0.083% nebulizer solution   APPLE CIDER VINEGAR PO   Cholecalciferol 50 MCG (2000 UT) Caps   ezetimibe 10 MG tablet Commonly known as: ZETIA   Fish Oil 1000 MG Caps   lisinopril 10 MG tablet Commonly known as: ZESTRIL   metFORMIN 500 MG tablet Commonly known as: GLUCOPHAGE   metoprolol tartrate 100 MG tablet Commonly known as: LOPRESSOR   nystatin powder Commonly known as: MYCOSTATIN/NYSTOP   omeprazole 20 MG capsule Commonly known as: PRILOSEC   polyethylene glycol powder 17 GM/SCOOP powder Commonly known as: GLYCOLAX/MIRALAX Replaced by: polyethylene glycol 17 g packet   Victoza 18 MG/3ML Sopn Generic drug: liraglutide   vitamin A 81191 UNIT capsule   Xarelto 20 MG Tabs tablet Generic drug: rivaroxaban   Zinc 220 (50 Zn) MG Caps     TAKE these medications   albuterol (2.5 MG/3ML) 0.083% nebulizer solution Commonly known as: PROVENTIL Take 3 mLs (2.5 mg total) by nebulization every 4 (four) hours as needed for wheezing or shortness of breath. Replaces: albuterol 108 (90 Base) MCG/ACT inhaler   amiodarone 200 MG tablet Commonly known as: PACERONE Take 1 tablet (200 mg total) by mouth daily. What changed: when to take this   amoxicillin-clavulanate 875-125 MG tablet  Commonly known as: AUGMENTIN Take 1 tablet by mouth every 8 (eight) hours.   atorvastatin 20 MG tablet Commonly known as: LIPITOR Take 20 mg by mouth every evening.   diltiazem 240 MG 24 hr capsule Commonly known as: CARDIZEM CD Take 1 capsule (240 mg total) by mouth daily.   docusate sodium 100 MG capsule Commonly known as: COLACE Take 1 capsule (100 mg total) by mouth 2 (two) times daily.   Ensure Max Protein Liqd Take 330 mLs (11 oz total) by  mouth at bedtime.   feeding supplement Liqd Take 237 mLs by mouth 2 (two) times daily between meals.   HYDROmorphone 1 MG/ML injection Commonly known as: DILAUDID Inject 1 mL (1 mg total) into the vein every 4 (four) hours as needed (prior to turns and dressing changes).   insulin aspart 100 UNIT/ML injection Commonly known as: novoLOG Inject 0-20 Units into the skin 3 (three) times daily with meals. Question Answer Comment Correction coverage: Resistant (obese, steroids)  CBG < 70: Implement Hypoglycemia Standing Orders and refer to Hypoglycemia Standing Orders sidebar report  CBG 70 - 120: 0 units  CBG 121 - 150: 3 units  CBG 151 - 200: 4 units  CBG 201 - 250: 7 units  CBG 251 - 300: 11 units  CBG 301 - 350: 15 units  CBG 351 - 400: 20 units  CBG > 400 call MD and obtain STAT lab verification   insulin aspart 100 UNIT/ML injection Commonly known as: novoLOG Inject 0-5 Units into the skin at bedtime. Correction coverage: HS scale  CBG < 70: Implement Hypoglycemia Standing Orders and refer to Hypoglycemia Standing Orders sidebar report  CBG 70 - 120: 0 units  CBG 121 - 150: 0 units  CBG 151 - 200: 0 units  CBG 201 - 250: 2 units  CBG 251 - 300: 3 units  CBG 301 - 350: 4 units  CBG 351 - 400: 5 units  CBG > 400 call MD and obtain STAT lab verification   insulin aspart 100 UNIT/ML injection Commonly known as: novoLOG Inject 18 Units into the skin 3 (three) times daily with meals.   insulin detemir 100 UNIT/ML injection Commonly known as: LEVEMIR Inject 0.42 mLs (42 Units total) into the skin 2 (two) times daily.   lactulose 10 GM/15ML solution Commonly known as: CHRONULAC Take 15 mLs (10 g total) by mouth every 4 (four) hours as needed (thick stools impeding stool passage thru Flexiseal).   levothyroxine 75 MCG tablet Commonly known as: SYNTHROID Take 75 mcg by mouth daily.   melatonin 3 MG Tabs tablet Take 1 tablet (3 mg total) by mouth at bedtime as needed.    multivitamin with minerals Tabs tablet Take 1 tablet by mouth daily.   nortriptyline 50 MG capsule Commonly known as: PAMELOR Take 1 capsule (50 mg total) by mouth 2 (two) times daily. What changed:   medication strength  how much to take  when to take this   ondansetron 4 MG/2ML Soln injection Commonly known as: ZOFRAN Inject 2 mLs (4 mg total) into the vein every 6 (six) hours as needed for nausea.   oxyCODONE 20 mg 12 hr tablet Commonly known as: OXYCONTIN Take 1 tablet (20 mg total) by mouth every 12 (twelve) hours.   Oxycodone HCl 10 MG Tabs Take 1 tablet (10 mg total) by mouth every 4 (four) hours as needed for breakthrough pain.   pantoprazole 40 MG tablet Commonly known as: PROTONIX Take 1 tablet (40  mg total) by mouth daily.   polyethylene glycol 17 g packet Commonly known as: MIRALAX / GLYCOLAX Take 17 g by mouth daily. Replaces: polyethylene glycol powder 17 GM/SCOOP powder   pregabalin 300 MG capsule Commonly known as: LYRICA Take 300 mg by mouth in the morning and at bedtime.   saccharomyces boulardii 250 MG capsule Commonly known as: FLORASTOR Take 1 capsule (250 mg total) by mouth 2 (two) times daily.   warfarin 2 MG tablet Commonly known as: COUMADIN Take 1 tablet (2 mg total) by mouth daily at 4 PM.       Follow-up Information    Redmon, Noelle, PA Follow up.   Specialty: Physician Assistant Why: For primary care  Contact information: [address] [place] [address] [phone]              Allergies  Allergen Reactions  . Bee Venom Anaphylaxis    Other reaction(s): Unknown Other reaction(s): Unknown Other reaction(s): Unknown   . Sglt2 Inhibitors Other (See Comments)    Necrotizing infection of the perineum  . Codeine Other (See Comments)    Pt states that his mother told him he is allergic, but has taken this medication multiple ties with no problems.    . Other Hives   Allergies as of 08/05/2020       Reactions   Bee Venom Anaphylaxis   Other reaction(s): Unknown Other reaction(s): Unknown Other reaction(s): Unknown   Sglt2 Inhibitors Other (See Comments)   Necrotizing infection of the perineum   Codeine Other (See Comments)   Pt states that his mother told him he is allergic, but has taken this medication multiple ties with no problems.    Other Hives      Medication List    STOP taking these medications   albuterol 108 (90 Base) MCG/ACT inhaler Commonly known as: VENTOLIN HFA Replaced by: albuterol (2.5 MG/3ML) 0.083% nebulizer solution   APPLE CIDER VINEGAR PO   Cholecalciferol 50 MCG (2000 UT) Caps   ezetimibe 10 MG tablet Commonly known as: ZETIA   Fish Oil 1000 MG Caps   lisinopril 10 MG tablet Commonly known as: ZESTRIL   metFORMIN 500 MG tablet Commonly known as: GLUCOPHAGE   metoprolol tartrate 100 MG tablet Commonly known as: LOPRESSOR   nystatin powder Commonly known as: MYCOSTATIN/NYSTOP   omeprazole 20 MG capsule Commonly known as: PRILOSEC   polyethylene glycol powder 17 GM/SCOOP powder Commonly known as: GLYCOLAX/MIRALAX Replaced by: polyethylene glycol 17 g packet   Victoza 18 MG/3ML Sopn Generic drug: liraglutide   vitamin A 91478 UNIT capsule   Xarelto 20 MG Tabs tablet Generic drug: rivaroxaban   Zinc 220 (50 Zn) MG Caps     TAKE these medications   albuterol (2.5 MG/3ML) 0.083% nebulizer solution Commonly known as: PROVENTIL Take 3 mLs (2.5 mg total) by nebulization every 4 (four) hours as needed for wheezing or shortness of breath. Replaces: albuterol 108 (90 Base) MCG/ACT inhaler   amiodarone 200 MG tablet Commonly known as: PACERONE Take 1 tablet (200 mg total) by mouth daily. What changed: when to take this   amoxicillin-clavulanate 875-125 MG tablet Commonly known as: AUGMENTIN Take 1 tablet by mouth every 8 (eight) hours.   atorvastatin 20 MG tablet Commonly known as: LIPITOR Take 20 mg by mouth every evening.    diltiazem 240 MG 24 hr capsule Commonly known as: CARDIZEM CD Take 1 capsule (240 mg total) by mouth daily.   docusate sodium 100 MG  capsule Commonly known as: COLACE Take 1 capsule (100 mg total) by mouth 2 (two) times daily.   Ensure Max Protein Liqd Take 330 mLs (11 oz total) by mouth at bedtime.   feeding supplement Liqd Take 237 mLs by mouth 2 (two) times daily between meals.   HYDROmorphone 1 MG/ML injection Commonly known as: DILAUDID Inject 1 mL (1 mg total) into the vein every 4 (four) hours as needed (prior to turns and dressing changes).   insulin aspart 100 UNIT/ML injection Commonly known as: novoLOG Inject 0-20 Units into the skin 3 (three) times daily with meals. Question Answer Comment Correction coverage: Resistant (obese, steroids)  CBG < 70: Implement Hypoglycemia Standing Orders and refer to Hypoglycemia Standing Orders sidebar report  CBG 70 - 120: 0 units  CBG 121 - 150: 3 units  CBG 151 - 200: 4 units  CBG 201 - 250: 7 units  CBG 251 - 300: 11 units  CBG 301 - 350: 15 units  CBG 351 - 400: 20 units  CBG > 400 call MD and obtain STAT lab verification   insulin aspart 100 UNIT/ML injection Commonly known as: novoLOG Inject 0-5 Units into the skin at bedtime. Correction coverage: HS scale  CBG < 70: Implement Hypoglycemia Standing Orders and refer to Hypoglycemia Standing Orders sidebar report  CBG 70 - 120: 0 units  CBG 121 - 150: 0 units  CBG 151 - 200: 0 units  CBG 201 - 250: 2 units  CBG 251 - 300: 3 units  CBG 301 - 350: 4 units  CBG 351 - 400: 5 units  CBG > 400 call MD and obtain STAT lab verification   insulin aspart 100 UNIT/ML injection Commonly known as: novoLOG Inject 18 Units into the skin 3 (three) times daily with meals.   insulin detemir 100 UNIT/ML injection Commonly known as: LEVEMIR Inject 0.42 mLs (42 Units total) into the skin 2 (two) times daily.   lactulose 10 GM/15ML solution Commonly known as: CHRONULAC Take 15 mLs  (10 g total) by mouth every 4 (four) hours as needed (thick stools impeding stool passage thru Flexiseal).   levothyroxine 75 MCG tablet Commonly known as: SYNTHROID Take 75 mcg by mouth daily.   melatonin 3 MG Tabs tablet Take 1 tablet (3 mg total) by mouth at bedtime as needed.   multivitamin with minerals Tabs tablet Take 1 tablet by mouth daily.   nortriptyline 50 MG capsule Commonly known as: PAMELOR Take 1 capsule (50 mg total) by mouth 2 (two) times daily. What changed:   medication strength  how much to take  when to take this   ondansetron 4 MG/2ML Soln injection Commonly known as: ZOFRAN Inject 2 mLs (4 mg total) into the vein every 6 (six) hours as needed for nausea.   oxyCODONE 20 mg 12 hr tablet Commonly known as: OXYCONTIN Take 1 tablet (20 mg total) by mouth every 12 (twelve) hours.   Oxycodone HCl 10 MG Tabs Take 1 tablet (10 mg total) by mouth every 4 (four) hours as needed for breakthrough pain.   pantoprazole 40 MG tablet Commonly known as: PROTONIX Take 1 tablet (40 mg total) by mouth daily.   polyethylene glycol 17 g packet Commonly known as: MIRALAX / GLYCOLAX Take 17 g by mouth daily. Replaces: polyethylene glycol powder 17 GM/SCOOP powder   pregabalin 300 MG capsule Commonly known as: LYRICA Take 300 mg by mouth in the morning and at bedtime.   saccharomyces boulardii 250 MG capsule  Commonly known as: FLORASTOR Take 1 capsule (250 mg total) by mouth 2 (two) times daily.   warfarin 2 MG tablet Commonly known as: COUMADIN Take 1 tablet (2 mg total) by mouth daily at 4 PM.       Procedures/Studies: CT ABDOMEN PELVIS WO CONTRAST  Result Date: 08/02/2020 CLINICAL DATA:  Low abdominal and pelvic pain after attempted flexiseal placement EXAM: CT ABDOMEN AND PELVIS WITHOUT CONTRAST TECHNIQUE: Multidetector CT imaging of the abdomen and pelvis was performed following the standard protocol without IV contrast. COMPARISON:  06/22/2020 FINDINGS:  Lower chest: Trace left pleural effusion with left basilar atelectasis. Right lung base is clear. Heart size within normal limits. Hepatobiliary: Unremarkable unenhanced appearance of the liver. No focal liver lesion identified. Gallbladder within normal limits. No hyperdense gallstone. No biliary dilatation. Pancreas: Unremarkable. No pancreatic ductal dilatation or surrounding inflammatory changes. Spleen: Normal in size without focal abnormality. Adrenals/Urinary Tract: Unremarkable adrenal glands. Tiny 1-2 mm calcification within the interpolar region of the right kidney which may reflect a nonobstructing stone. No hydronephrosis. Urinary bladder is decompressed by Foley catheter. Stomach/Bowel: Postoperative changes of sleeve gastrectomy. Stomach otherwise within normal limits. No dilated loops of small bowel. Moderate-large volume of stool throughout the colon. Normal appendix in the right lower quadrant. No focal bowel wall thickening or inflammatory changes. Vascular/Lymphatic: Scattered aortoiliac atherosclerotic calcifications without aneurysm. No abdominopelvic lymphadenopathy. Reproductive: Unremarkable. Other: No free fluid. No abdominopelvic fluid collection. No pneumoperitoneum. No abdominal wall hernia. Musculoskeletal: Mild diffuse anasarca, most pronounced at the left lateral abdominal wall. Probable injection related changes within the subcutaneous soft tissues of the lower anterior abdominal wall. No fluid collection or soft tissue gas. No acute osseous findings. IMPRESSION: 1. No acute abdominopelvic findings. Specifically, no evidence of bowel perforation. 2. Moderate-large volume of stool throughout the colon. 3. Trace left pleural effusion with left basilar atelectasis. 4. Tiny nonobstructing right renal stone. Aortic Atherosclerosis (ICD10-I70.0). Electronically Signed   By: Duanne Guess D.O.   On: 08/02/2020 10:40   CT HEAD WO CONTRAST  Result Date: 07/15/2020 CLINICAL DATA:   Dizziness, nonspecific, blurry vision. EXAM: CT HEAD WITHOUT CONTRAST TECHNIQUE: Contiguous axial images were obtained from the base of the skull through the vertex without intravenous contrast. COMPARISON:  CT head 06/25/20 FINDINGS: Brain: No evidence of large-territorial acute infarction. No parenchymal hemorrhage. No mass lesion. No extra-axial collection. No mass effect or midline shift. No hydrocephalus. Basilar cisterns are patent. Vascular: No hyperdense vessel. Skull: No acute fracture or focal lesion. Sinuses/Orbits: Left sphenoid sinus mucosal thickening. Redemonstration of bilateral mastoid air cell opacification. Otherwise the remaining paranasal sinuses are clear. No fluid noted within the middle ear. The orbits are unremarkable. Other: None. IMPRESSION: No acute intracranial abnormality. Electronically Signed   By: Tish Frederickson M.D.   On: 07/15/2020 23:09      Subjective: Pt has had several large bowel movements, abdominal cramping and pain has resolved.  I had a long conversation with him that I felt that it was medically prudent to start weaning opioids and he verbalized understanding.  Discharge Exam: Vitals:   08/04/20 2056 08/05/20 0527  BP: 100/68 119/63  Pulse: 89 (!) 104  Resp: 18 18  Temp: 97.9 F (36.6 C) 97.7 F (36.5 C)  SpO2: 95% 92%   Vitals:   08/04/20 1600 08/04/20 2056 08/05/20 0500 08/05/20 0527  BP: (!) 112/55 100/68  119/63  Pulse: (!) 107 89  (!) 104  Resp: 20 18  18   Temp: 98 F (36.7 C)  97.9 F (36.6 C)  97.7 F (36.5 C)  TempSrc: Oral Oral    SpO2: 98% 95%  92%  Weight:   (!) 248.2 kg   Height:       General exam: super-morbidly obese male, lying on stomach, his agitation has resolved completely now.  Respiratory system: Shallow BS, no crackles or rales heard.  Cardiovascular system: normal S1 & S2 heard. No JVD, murmurs, rubs, gallops or clicks. 2+ pedal edema. Gastrointestinal system: Abdomen is superobese, nondistended, soft and diffusely  tender. No organomegaly or masses felt. Distant bowel sounds heard. Central nervous system: Alert and oriented x4. No focal neurological deficits. Extremities: 1+ pretibial edema BLEs. Symmetric 5 x 5 power. Skin: perineal wounds slowly healing.  Psychiatry: Judgement and insight appear poor. Mood & affect appropriate.    The results of significant diagnostics from this hospitalization (including imaging, microbiology, ancillary and laboratory) are listed below for reference.     Microbiology: No results found for this or any previous visit (from the past 240 hour(s)).   Labs: BNP (last 3 results) No results for input(s): BNP in the last 8760 hours. Basic Metabolic Panel: Recent Labs  Lab 08/03/20 0139 08/03/20 0835 08/04/20 0039 08/05/20 0515  NA 135  --  135 133*  K 3.3*  --  3.2* 3.6  CL 96*  --  96* 93*  CO2 31  --  27 29  GLUCOSE 128*  --  146* 132*  BUN 7  --  6 9  CREATININE 0.92  --  0.79 1.04  CALCIUM 8.2*  --  8.3* 8.3*  MG  --  1.7 1.8 2.5*   Liver Function Tests: Recent Labs  Lab 08/03/20 0139  AST 23  ALT 17  ALKPHOS 86  BILITOT 0.5  PROT 6.3*  ALBUMIN 2.0*   No results for input(s): LIPASE, AMYLASE in the last 168 hours. No results for input(s): AMMONIA in the last 168 hours. CBC: Recent Labs  Lab 08/01/20 0437 08/03/20 0139  WBC 6.4 7.2  HGB 9.2* 8.9*  HCT 30.5* 30.4*  MCV 87.4 87.4  PLT 307 322   Cardiac Enzymes: No results for input(s): CKTOTAL, CKMB, CKMBINDEX, TROPONINI in the last 168 hours. BNP: Invalid input(s): POCBNP CBG: Recent Labs  Lab 08/04/20 1301 08/04/20 1850 08/04/20 2058 08/05/20 0757 08/05/20 1140  GLUCAP 138* 190* 162* 184* 182*   D-Dimer No results for input(s): DDIMER in the last 72 hours. Hgb A1c No results for input(s): HGBA1C in the last 72 hours. Lipid Profile No results for input(s): CHOL, HDL, LDLCALC, TRIG, CHOLHDL, LDLDIRECT in the last 72 hours. Thyroid function studies No results for input(s):  TSH, T4TOTAL, T3FREE, THYROIDAB in the last 72 hours.  Invalid input(s): FREET3 Anemia work up No results for input(s): VITAMINB12, FOLATE, FERRITIN, TIBC, IRON, RETICCTPCT in the last 72 hours. Urinalysis    Component Value Date/Time   COLORURINE AMBER (A) 06/10/2020 2047   APPEARANCEUR CLOUDY (A) 06/10/2020 2047   LABSPEC 1.015 06/10/2020 2047   PHURINE 5.0 06/10/2020 2047   GLUCOSEU >=500 (A) 06/10/2020 2047   HGBUR MODERATE (A) 06/10/2020 2047   BILIRUBINUR NEGATIVE 06/10/2020 2047   KETONESUR NEGATIVE 06/10/2020 2047   PROTEINUR 100 (A) 06/10/2020 2047   NITRITE NEGATIVE 06/10/2020 2047   LEUKOCYTESUR NEGATIVE 06/10/2020 2047   Sepsis Labs Invalid input(s): PROCALCITONIN,  WBC,  LACTICIDVEN Microbiology No results found for this or any previous visit (from the past 240 hour(s)).  Time coordinating discharge: 65 minutes   SIGNED:  Standley Dakins, MD  [facility] 08/05/2020, 1:04 PM How to contact the [facility] Attending or Consulting provider 7A - 7P or covering provider during after hours 7P -7A, for this patient?  1. Check the care team in [facility] and look for a) attending/consulting [facility] provider listed and b) the [facility] team listed 2. Log into [email or web address] and use [place]'s universal password to access. If you do not have the password, please contact the hospital operator. 3. Locate the [facility] provider you are looking for under [facility] and page to a number that you can be directly reached. 4. If you still have difficulty reaching the provider, please page the [facility] (Director on Call) for the Hospitalists listed on amion for assistance.

## 2020-08-05 NOTE — Progress Notes (Signed)
ANTICOAGULATION CONSULT NOTE  Pharmacy Consult for Warfarin  Indication: atrial fibrillation  Patient Measurements: Height: 5\' 10"  (177.8 cm) Weight: (!) 248.2 kg (547 lb 2.9 oz) IBW/kg (Calculated) : 73  Vital Signs: Temp: 97.7 F (36.5 C) (03/07 0527) BP: 119/63 (03/07 0527) Pulse Rate: 104 (03/07 0527)  Labs: Recent Labs    08/03/20 0139 08/04/20 0039 08/05/20 0515  HGB 8.9*  --   --   HCT 30.4*  --   --   PLT 322  --   --   LABPROT  --   --  31.3*  INR  --   --  3.1*  CREATININE 0.92 0.79 1.04    Estimated Creatinine Clearance: 175.8 mL/min (by C-G formula based on SCr of 1.04 mg/dL).   Assessment: 49YOM admitted with septic shock secondary to necrotizing fascitis. Was on Xarelto PTA but not appropriate based on patient's weight. Pharmacy consulted to dose warfarin.  Patient sensitive to warfarin in setting of drug interactions with amiodarone and Augmentin (has been on these for some time). Patient intake 100% with every meal. INR slightly supratherapeutic at 3.1 today on warfarin 2.5 mg daily. Will reduce the dose slightly again today to 2 mg daily. CBC stable - no bleeding reported.  Goal of Therapy:  INR 2-3 Monitor platelets by anticoagulation protocol: Yes  Plan:  Reduce Warfarin to 2 mg PO daily F/u QMon/Thur INR and weekly CBC Monitor for s/sx of bleeding  Thank you for allowing pharmacy to be a part of this patient's care.  10/05/20, PharmD, BCPS Clinical Pharmacist Clinical phone for 08/05/2020: [phone] 08/05/2020 12:07 PM   **Pharmacist phone directory can now be found on [email or web address] ([email or web address]).  Listed under [facility].

## 2020-08-05 NOTE — Progress Notes (Signed)
I went in to assist pt's nurse, Marcy Siren, with disimpaction and to administer the SMOG enema.  Was able to disimpact the pt small amount of soft stool and then we administered the SMOG enema with patient side lying.  After administration of the enema, the pt got up to the [facility] and had a very large amount of BM in the bedside commode.  After this we moved the patient in the bed to room 6N32 which has a sky lift which will assist the patient.

## 2020-08-05 NOTE — H&P (Signed)
Physical Medicine and Rehabilitation Admission H&P    Chief Complaint  Patient presents with  . Debility in setting of sepsis due to Fournier's gangrene and super morbid obesity.     HPI: Carl Hill. Snapp is a 50 year old male with history of T2DM, PAF (Dr. Boneta Lucks), chronic right heel ulcer (Dr. Sharol Harness), supermorbid obesity--BMI 78 who was admitted on 06/10/20 with fever, chills, leucocytosis and AKI from septic shock due to necrotizing soft tissue infection of perianal, gluteal and perineal tissues. He was taken to OR for excisional debridement the same day by Dr. Sophronia Simas. Later that day he developed A fib with RVR/VT arrest s/p CPR.  He was taken back to OR the next day due to further necrosis requiring debridement and wound cultures showed group B streptococcus actinomyces, E coli and abundant peptostreptococcus species.   He was treated with broad spectrum antibiotics but continued to have fevers up to 105 and Dr. Daiva Eves recommended consolidating antibiotics to [facility] for IV antibiotics X  Weeks and has been transitioned to Augmentin X 6 months.    He required tracheostomy on 01/26, has been weaned to ATC and tolerating regular diet. He self decannulated accidentally on 02/24 and respiratory status stable. Surgery recommended wet to dry dressing with  hydrotherapy which has helped to clean up wound bed. CCS has deferred to [facility] for wound care and patient to continue wound local measures with dressing change for management of moderate yellow exudate with wet to dry dressing and flexiseal for bowel management. Nephrology consulted for input on volume overload with hypernatremia and AKI --> felt to be due to AKI/ATN with osmotic diuresis and has resolved with free water and resolution of polyuria.   On coumadin now with amiodarone/cardizem for management of A fib. Deep tissue injury bilateral heels treated with betadine. OIC with fecal impaction managed with SMOG enema/mag  citrate on 03/06 and bowel program has been augmented. He continues to require IV dilaudid for mobility--being weaned down to 0.5-0.75 mg tid prn.   Damp to dry dressing changes ongoing to sacrum and rectal tube has been out for past week. Foley remains in place. Blood sugars better controlled on levemir. Therapy has been ongoing with patient showing ability to WB on BLE and stand for 30-45 seconds X 3 with 2-3 person assist. CIR recommended due to functional decline. He complains of pain at sacral pressure injury and shows pictures of its healing process.    Review of Systems  Constitutional: Negative for chills and fever.  HENT: Negative for hearing loss and tinnitus.   Eyes: Negative for blurred vision and double vision.  Respiratory: Negative for cough and shortness of breath.   Cardiovascular: Negative for chest pain and palpitations.  Gastrointestinal: Positive for diarrhea. Negative for abdominal pain and heartburn.  Genitourinary: Negative for dysuria and urgency.  Musculoskeletal: Negative for myalgias.  Skin: Negative for rash.  Neurological: Positive for weakness. Negative for dizziness and headaches.  Psychiatric/Behavioral: The patient is not nervous/anxious and does not have insomnia.      Past Medical History:  Diagnosis Date  . 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 Asthma   . Atrial fibrillation (HCC)   . Diabetes mellitus without complication (HCC)   . History of cardioversion    3 times   . Hypertension   . Sepsis due to Streptococcus, group B (HCC) 06/22/2020    Past Surgical History:  Procedure Laterality Date  . ABDOMINAL SURGERY    . INCISION AND DRAINAGE PERIRECTAL  ABSCESS N/A 06/11/2020   Procedure: IRRIGATION AND DEBRIDEMENT PERINEAL WOUND;  Surgeon: Fritzi MandesAllen, Shelby L, MD;  [facility];  Service: General;  Laterality: N/A;  . IRRIGATION AND DEBRIDEMENT ABSCESS N/A 06/10/2020   Procedure: IRRIGATION AND DEBRIDEMENT ABSCESS;  Surgeon: Fritzi MandesAllen, Shelby L, MD;  Location: [facility]  OR;  Service: General;  Laterality: N/A;  . LAPAROSCOPIC GASTRIC SLEEVE RESECTION      Family History  Problem Relation Age of Onset  . Diabetes Mother   . Healthy Brother   . Breast cancer Paternal Grandmother   . Pancreatic cancer Paternal Uncle      Social History:  Single. Has been out of work since 2019--has moved in with mother 08/2019. He reports that he has been smoking about a pack a week. He has never used smokeless tobacco. He reports previous alcohol use. He reports that he does not use drugs.     Allergies  Allergen Reactions  . Bee Venom Anaphylaxis    Other reaction(s): Unknown Other reaction(s): Unknown Other reaction(s): Unknown   . Sglt2 Inhibitors Other (See Comments)    Necrotizing infection of the perineum  . Codeine Other (See Comments)    Pt states that his mother told him he is allergic, but has taken this medication multiple ties with no problems.    . Other Hives    Medications Prior to Admission  Medication Sig Dispense Refill  . atorvastatin (LIPITOR) 20 MG tablet Take 20 mg by mouth every evening.    Marland Kitchen. levothyroxine (SYNTHROID) 75 MCG tablet Take 75 mcg by mouth daily.    . pregabalin (LYRICA) 300 MG capsule Take 300 mg by mouth in the morning and at bedtime.    . [DISCONTINUED] albuterol (VENTOLIN HFA) 108 (90 Base) MCG/ACT inhaler Inhale 2 puffs into the lungs 4 (four) times daily as needed for shortness of breath or wheezing.    . [DISCONTINUED] amiodarone (PACERONE) 200 MG tablet Take 1 tablet by mouth 2 (two) times daily.    . [DISCONTINUED] APPLE CIDER VINEGAR PO Take 1 tablet by mouth daily.    . [DISCONTINUED] Beta Carotene (VITAMIN A) 25000 UNIT capsule Take 25,000 Units by mouth daily.    . [DISCONTINUED] Cholecalciferol 50 MCG (2000 UT) CAPS Take 2,000 Units by mouth daily.    . [DISCONTINUED] ezetimibe (ZETIA) 10 MG tablet Take 10 mg by mouth daily.    . [DISCONTINUED] liraglutide (VICTOZA) 18 MG/3ML SOPN Inject 1.8 mLs into the skin  every evening.    . [DISCONTINUED] lisinopril (ZESTRIL) 10 MG tablet Take 10 mg by mouth daily.    . [DISCONTINUED] metFORMIN (GLUCOPHAGE) 500 MG tablet Take 500 mg by mouth in the morning and at bedtime.    . [DISCONTINUED] metoprolol tartrate (LOPRESSOR) 100 MG tablet Take 100 mg by mouth 2 (two) times daily.    . [DISCONTINUED] Multiple Vitamins-Minerals (ALIVE MENS ENERGY PO) Take 1 tablet by mouth daily.    . [DISCONTINUED] nortriptyline (PAMELOR) 25 MG capsule Take 25 mg by mouth in the morning and at bedtime.    . [DISCONTINUED] nystatin (MYCOSTATIN/NYSTOP) powder Apply 1 application topically daily as needed (rash).    . [DISCONTINUED] Omega-3 Fatty Acids (FISH OIL) 1000 MG CAPS Take 1,000 mg by mouth daily.    . [DISCONTINUED] omeprazole (PRILOSEC) 20 MG capsule Take 20 mg by mouth in the morning and at bedtime.    . [DISCONTINUED] polyethylene glycol powder (GLYCOLAX/MIRALAX) 17 GM/SCOOP powder Take 17 g by mouth daily as needed for mild constipation.    . [  DISCONTINUED] XARELTO 20 MG TABS tablet Take 20 mg by mouth daily.    . [DISCONTINUED] Zinc 220 (50 Zn) MG CAPS Take 220 mg by mouth daily.      Drug Regimen Review  Drug regimen was reviewed and remains appropriate with no significant issues identified  Home: Home Living Family/patient expects to be discharged to:: Private residence Living Arrangements: Parent Available Help at Discharge: Family,Available 24 hours/day Type of Home: House Home Access: Stairs to enter [employer] of Steps: 6 (6 one entrance, 8 the other) Home Layout: Able to live on main level with bedroom/bathroom Bathroom Shower/Tub: Engineer, manufacturing systems: Handicapped height Bathroom Accessibility: Yes Home Equipment: Environmental consultant - 2 wheels Additional Comments: PT in room got more information from mother in room prior to OTR arrival.  Lives With: Other (Comment)   Functional History: Prior Function Level of Independence: Needs  assistance Gait / Transfers Assistance Needed: Pt reports since 07/2018, pt has been NWB on RLE and that has decreased ability to perform transfers and get OOB. Pt's mother assists with pericare. ADL's / Homemaking Assistance Needed: Pt able to perform UB ADL, but mother assisted with trunk, back and LB ADL.  Functional Status:  Mobility: Bed Mobility Overal bed mobility: Needs Assistance Bed Mobility: Rolling,Sidelying to Sit,Sit to Sidelying Rolling: Modified independent (Device/Increase time) Sidelying to sit: Supervision Supine to sit: Supervision Sit to supine: Min guard Sit to sidelying: Mod assist,+2 for physical assistance General bed mobility comments: Pt able to roll supervision with use of bed rail, supervision to come up to sitting with HOB ~30 degrees and heavy use of rail, extra time needed to complete.  Mod assist of two people to help him get his feet back up on the bed and where he can push against the foot board to move back towards [facility] (when we sit up he gets very far down to the end of the bed and it is difficult for him to flexh his legs and get/stabliize them on the foot board when he frist returns to side lying.  If he were in better position, I believe he could be supervision with this move as well. Transfers Overall transfer level: Needs assistance Equipment used: 2 person hand held assist (recliner chair locked and turned backwards towards the pt, third person to stabilize.) Transfers: Sit to/from Stand Sit to Stand: Mod assist,+2 physical assistance General transfer comment: two person mod assist (lighter mod assist today) to come to standing from elevated bed with bil knees blocked and sheet for gait belt.  Pt better able to power up with momentum "one two three" and lock out knees in standing.  On first stand even able to weight shift and get his feet up under himself.  Second and third stand were similiar.  Stood anywhere from 30-45 seconds with each stand today which  was longer than yesterday.  Leaning on chair stabilized by third person for support once standing. Ambulation/Gait General Gait Details: We talked about the next steps towards gait progression are weight shifting in standing and side stepping EOB before we come forward away from the bed.    ADL: ADL Overall ADL's : Needs assistance/impaired Eating/Feeding: Modified independent Eating/Feeding Details (indicate cue type and reason): eating sidelying; using red tubing; difficulty opening soda lids; May benefit from lid opener Grooming: Set up,Sitting,Bed level Upper Body Bathing: Set up,Sitting Lower Body Bathing: Moderate assistance,Sitting/lateral leans,Bed level Upper Body Dressing : Set up,Supervision/safety,Bed level Lower Body Dressing: Total assistance Toilet Transfer: Total assistance,+2 for  physical assistance,+2 for safety/equipment Toilet Transfer Details (indicate cue type and reason): staff advised to use maxi sky when recliner gets delivered Functional mobility during ADLs: Maximal assistance,+2 for physical assistance General ADL Comments: Pt states he does 10-90% of his bathing. discussed need to him to attempt to have his HOB in more of an upright position when bathing. Encouraged pt to work on rolling side- side to reach and wash his back. Pt verbalized understanding.  Cognition: Cognition Overall Cognitive Status: Within Functional Limits for tasks assessed Orientation Level: Oriented X4 Cognition Arousal/Alertness: Awake/alert Behavior During Therapy: WFL for tasks assessed/performed Overall Cognitive Status: Within Functional Limits for tasks assessed General Comments: Anxiety has decreased since building rapport with therapists.   Blood pressure 124/66, pulse 96, temperature 98 F (36.7 C), temperature source Axillary, resp. rate 18, height  (1.778 m), weight (!) 248.2 kg, SpO2 94 %. Physical Exam Gen: no distress, Morbidly obese (BMI 78.51) male, naked in  bed covered with sheet. Underpad soiled with loose stool.  HEENT: oral mucosa pink and moist, NCAT Cardio: Reg rate Chest: normal effort, normal rate of breathing Abd: soft, non-distended Ext: 2-3+ edema bilateral lower extremities with venous stasis. Psych: pleasant, normal affect Skin: Sacrum wound open with ongoing loose stool--(no dressing in place due to ongoing soilage per patient)  Neuro: Alert and oriented x3. Good historian. 5/5 strength in bilateral upper extremities, 4/5 in lower extremities. Decreased sensation in bilateral hands and feet.    Results for orders placed or performed during the hospital encounter of 06/10/20 (from the past 48 hour(s))  Glucose, capillary     Status: Abnormal   Collection Time: 08/03/20  4:58 PM  Result Value Ref Range   Glucose-Capillary 161 (H) 70 - 99 mg/dL    Comment: Glucose reference range applies only to samples taken after fasting for at least 8 hours.  Glucose, capillary     Status: Abnormal   Collection Time: 08/03/20  8:52 PM  Result Value Ref Range   Glucose-Capillary 116 (H) 70 - 99 mg/dL    Comment: Glucose reference range applies only to samples taken after fasting for at least 8 hours.  Basic metabolic panel     Status: Abnormal   Collection Time: 08/04/20 12:39 AM  Result Value Ref Range   Sodium 135 135 - 145 mmol/L   Potassium 3.2 (L) 3.5 - 5.1 mmol/L   Chloride 96 (L) 98 - 111 mmol/L   CO2 27 22 - 32 mmol/L   Glucose, Bld 146 (H) 70 - 99 mg/dL    Comment: Glucose reference range applies only to samples taken after fasting for at least 8 hours.   BUN 6 6 - 20 mg/dL   Creatinine, Ser 1.61 0.61 - 1.24 mg/dL   Calcium 8.3 (L) 8.9 - 10.3 mg/dL   GFR, Estimated >09 >60 mL/min    Comment: (NOTE) Calculated using the CKD-EPI Creatinine Equation (2021)    Anion gap 12 5 - 15    Comment: Performed at [facility], [address] [place], [place] [address]  Magnesium     Status: None   Collection Time: 08/04/20 12:39  AM  Result Value Ref Range   Magnesium 1.8 1.7 - 2.4 mg/dL    Comment: Performed at [facility], [address] [place], [place] [address]  Glucose, capillary     Status: Abnormal   Collection Time: 08/04/20  7:29 AM  Result Value Ref Range   Glucose-Capillary 123 (H) 70 -  99 mg/dL    Comment: Glucose reference range applies only to samples taken after fasting for at least 8 hours.  Glucose, capillary     Status: Abnormal   Collection Time: 08/04/20  1:01 PM  Result Value Ref Range   Glucose-Capillary 138 (H) 70 - 99 mg/dL    Comment: Glucose reference range applies only to samples taken after fasting for at least 8 hours.  Glucose, capillary     Status: Abnormal   Collection Time: 08/04/20  6:50 PM  Result Value Ref Range   Glucose-Capillary 190 (H) 70 - 99 mg/dL    Comment: Glucose reference range applies only to samples taken after fasting for at least 8 hours.  Glucose, capillary     Status: Abnormal   Collection Time: 08/04/20  8:58 PM  Result Value Ref Range   Glucose-Capillary 162 (H) 70 - 99 mg/dL    Comment: Glucose reference range applies only to samples taken after fasting for at least 8 hours.  C-reactive protein     Status: Abnormal   Collection Time: 08/05/20  5:15 AM  Result Value Ref Range   CRP 9.3 (H) <1.0 mg/dL    Comment: Performed at [facility], [address] [place], [place] [address]  Protime-INR     Status: Abnormal   Collection Time: 08/05/20  5:15 AM  Result Value Ref Range   Prothrombin Time 31.3 (H) 11.4 - 15.2 seconds   INR 3.1 (H) 0.8 - 1.2    Comment: (NOTE) INR goal varies based on device and disease states. Performed at [facility], [address], [place], [place] [address]   Basic metabolic panel     Status: Abnormal   Collection Time: 08/05/20  5:15 AM  Result Value Ref Range   Sodium 133 (L) 135 - 145 mmol/L   Potassium 3.6 3.5 - 5.1 mmol/L   Chloride 93 (L) 98 - 111 mmol/L   CO2 29 22 - 32 mmol/L   Glucose,  Bld 132 (H) 70 - 99 mg/dL    Comment: Glucose reference range applies only to samples taken after fasting for at least 8 hours.   BUN 9 6 - 20 mg/dL   Creatinine, Ser 8.29 0.61 - 1.24 mg/dL   Calcium 8.3 (L) 8.9 - 10.3 mg/dL   GFR, Estimated >56 >21 mL/min    Comment: (NOTE) Calculated using the CKD-EPI Creatinine Equation (2021)    Anion gap 11 5 - 15    Comment: Performed at [facility], [address] [place], [place] [address]  Magnesium     Status: Abnormal   Collection Time: 08/05/20  5:15 AM  Result Value Ref Range   Magnesium 2.5 (H) 1.7 - 2.4 mg/dL    Comment: Performed at [facility], [address], [place], [place] [address]  Glucose, capillary     Status: Abnormal   Collection Time: 08/05/20  7:57 AM  Result Value Ref Range   Glucose-Capillary 184 (H) 70 - 99 mg/dL    Comment: Glucose reference range applies only to samples taken after fasting for at least 8 hours.  Glucose, capillary     Status: Abnormal   Collection Time: 08/05/20 11:40 AM  Result Value Ref Range   Glucose-Capillary 182 (H) 70 - 99 mg/dL    Comment: Glucose reference range applies only to samples taken after fasting for at least 8 hours.   No results found.     Medical Problem List  and Plan: 1.  Debility secondary to septic shock from necrotizing soft tissue infection of perianal, gluteal, and perineal tissues.   -patient may shower but wound must be covered  -ELOS/Goals: modA (to be provided by father) in 2-3 weeks.  2.  Antithrombotics: -DVT/anticoagulation:  Pharmaceutical: Coumadin--was on Xarelto 20 mg daily-->Resume as AKI has resolved?   -antiplatelet therapy: N/A 3. Pain Management:  Continue Lyrica and elavil for neuropathy.   -- Decrease IV Dilaudid to 0.5mg  BID PRN with dressing changes  --Oxycontin 15 mg bid with 10 mg prn for breakthrough pain.  4. Mood: LCSW to follow for evaluation and support.   -antipsychotic agents: N/A 5. Neuropsych: This patient is capable  of making decisions on his own behalf. 6. Skin/Wound Care: Air mattress for pressure relief measures.   --will need local care frequently to keep sacral wound bed clean.  --Continue foley for now.  7. Fluids/Electrolytes/Nutrition: Monitor I/O. Check CMET in am.  8. Necrotizing fascitis: Has been on augment since 02/11--> to continue for 6 months per ID 9. T2DM with neuropathy: Hgb A1c- 9.6 (down from 13 in Aug) Will continue to monitor BS ac/hs and use SSI for elevated BS  --used Farxiga, metformin and Victoza 1.8.  --Continue Levemir bid and titrate as indicated.  10 Hyponatremia: Likely due to multiple BM yesterday.   --Will recheck in am.  11. Hypokalemia:  Repeat K+ tomorrow.  12. Proximal A FIb: Monitor HR tid--continue Cardizem 240 mg and amiodarone for rate control.   --monitor for symptoms with increase in activity.  13. OIC: Finally had results yesterday--3 big BM  --Will discontinue colace to help bulk stools as now having loose stools.   14. Chronic right heel ulcer: Continue to paint wound daily.  15. Morbid obesity, BMI 78.51: provide counseling. Will need bariatric equipment.  16. Disposition: at baseline, patient received significant assistance from his mother, aged 9. He required her assistance to wipe stool after BM due to body habitus. He was able to ambulate to bathroom and back on his own. Plan is for discharge home to father, who is a IT sales professional, and who can provide more physical support to patient. Goal is ModA level for ambulation within his room. Discussed with patient plan for approx 2-3 week stay to reach this goal. He is currently demonstrating improved standing tolerance. Fuller Song discussed with father.    I have personally performed a face to face diagnostic evaluation, including, but not limited to relevant history and physical exam findings, of this patient and developed relevant assessment and plan.  Additionally, I have reviewed and concur with the physician  assistant's documentation above.  Sula Soda, MD  Jacquelynn Cree, PA-C 08/05/2020

## 2020-08-05 NOTE — Progress Notes (Signed)
Report called to [facility] on unit 4 west. Patient to transfer to 4 west bed 14.

## 2020-08-05 NOTE — Progress Notes (Signed)
PMR Admission Coordinator Pre-Admission Assessment  Patient: Carl Hill is an 49 y.o., male MRN: [id number] DOB: 11/07/1970 Height: 5' 10" (177.8 cm) Weight: (!) 248.2 kg  Insurance Information HMO:    PPO:      PCP:      IPA:      80/20:      OTHER:  PRIMARY: Medicaid [place] Access       Policy#: [id number]      Subscriber: Pt.  CM Name:    Phone#:      Fax#:  Pre-Cert#:       Employer:  Benefits:  Phone #:      Name:  Eff. Date:      Deduct:       Out of Pocket Max:       Life Max:  CIR:       SNF:  Outpatient:      Co-Pay:  Home Health:       Co-Pay:  DME:      Co-Pay:  Providers:  SECONDARY:       Policy#:      Phone#:   Financial Counselor:       Phone#:   The "Data Collection Information Summary" for patients in Inpatient Rehabilitation Facilities with attached "Privacy Act Statement-Health Care Records" was provided and verbally reviewed with: Patient  Emergency Contact Information Contact Information    Name Relation Home Work Mobile   Marini, Phyllis Mother   [phone]   Fogel,DOUGLAS  [phone]        Current Medical History  Patient Admitting Diagnosis: Debility 2/2 necrotizing fasciitis  History of Present Illness: Pt is a 49 year old male with PMH of PMHx: HTN, obesity, DM, AFib.  As of 07/04/20 started TC trials.  Accidental self decannulated 07/26/20 and left out.who presented with buttock pain, fatigue and malaise.  Found to have hyperglycemia. CT pelvis demonstrated necrotizing fasciitis in L buttock, L upper thigh, and perineum. Taken to OR for debridement 1/10 c/b Afib with RVR and VT arrest s/p CPR/defib. Trached 1/26, self-decannulated 2/25. CIR was consulted to assist in return to PLOF    Patient's medical record from [facility]  has been reviewed by the rehabilitation admission coordinator and physician.  Past Medical History  Past Medical History:  Diagnosis Date  . 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 Asthma   . Atrial fibrillation (HCC)   . Diabetes  mellitus without complication (HCC)   . History of cardioversion    3 times   . Hypertension   . Sepsis due to Streptococcus, group B (HCC) 06/22/2020    Family History   family history is not on file.  Prior Rehab/Hospitalizations Has the patient had prior rehab or hospitalizations prior to admission? Yes  Has the patient had major surgery during 100 days prior to admission? Yes   Current Medications  Current Facility-Administered Medications:  .  0.9 %  sodium chloride infusion, , Intravenous, PRN, Hunsucker, Matthew R, MD, Stopped at 06/28/20 2035 .  acetaminophen (TYLENOL) tablet 650 mg, 650 mg, Oral, Q6H PRN, 650 mg at 08/01/20 2006 **OR** acetaminophen (TYLENOL) 160 MG/5ML solution 650 mg, 650 mg, Oral, Q6H PRN, Opyd, Timothy S, MD, 650 mg at 07/30/20 2039 .  albuterol (PROVENTIL) (2.5 MG/3ML) 0.083% nebulizer solution 2.5 mg, 2.5 mg, Nebulization, Q4H PRN, Hunsucker, Matthew R, MD, 2.5 mg at 07/05/20 1514 .  amiodarone (PACERONE) tablet 200 mg, 200 mg, Oral, Daily, Akula, Vijaya, MD, 200 mg at 08/05/20 0901 .  amoxicillin-clavulanate (AUGMENTIN) 875-125 MG   per tablet 1 tablet, 1 tablet, Oral, Q8H, Akula, Vijaya, MD, 1 tablet at 08/05/20 0524 .  atorvastatin (LIPITOR) tablet 20 mg, 20 mg, Oral, QPM, Akula, Vijaya, MD, 20 mg at 08/04/20 1932 .  chlorhexidine (PERIDEX) 0.12 % solution 15 mL, 15 mL, Mouth Rinse, BID, Hunsucker, Matthew R, MD, 15 mL at 08/05/20 0901 .  Chlorhexidine Gluconate Cloth 2 % PADS 6 each, 6 each, Topical, Daily, Agarwala, Ravi, MD, 6 each at 08/05/20 1031 .  diltiazem (CARDIZEM CD) 24 hr capsule 240 mg, 240 mg, Oral, Daily, Joseph, Preetha, MD, 240 mg at 08/05/20 0901 .  docusate sodium (COLACE) capsule 100 mg, 100 mg, Oral, BID, Ellis, Allison L, NP, 100 mg at 08/05/20 0900 .  feeding supplement (ENSURE ENLIVE / ENSURE PLUS) liquid 237 mL, 237 mL, Oral, BID BM, Akula, Vijaya, MD, 237 mL at 08/05/20 0901 .  HYDROmorphone (DILAUDID) injection 0.5-0.75 mg,  0.5-0.75 mg, Intravenous, TID PRN, Johnson, Clanford L, MD, 0.75 mg at 08/05/20 0430 .  insulin aspart (novoLOG) injection 0-20 Units, 0-20 Units, Subcutaneous, TID WC, Opyd, Timothy S, MD, 4 Units at 08/05/20 0859 .  insulin aspart (novoLOG) injection 0-5 Units, 0-5 Units, Subcutaneous, QHS, Opyd, Timothy S, MD, 2 Units at 07/14/20 2136 .  insulin aspart (novoLOG) injection 18 Units, 18 Units, Subcutaneous, TID WC, Akula, Vijaya, MD, 18 Units at 08/03/20 1812 .  insulin detemir (LEVEMIR) injection 42 Units, 42 Units, Subcutaneous, BID, Hunsucker, Matthew R, MD, 42 Units at 08/05/20 0859 .  lactulose (CHRONULAC) 10 GM/15ML solution 10 g, 10 g, Oral, Q4H PRN, Ellis, Allison L, NP, 10 g at 08/04/20 1000 .  levothyroxine (SYNTHROID) tablet 75 mcg, 75 mcg, Oral, Daily, Akula, Vijaya, MD, 75 mcg at 08/05/20 0523 .  MEDLINE mouth rinse, 15 mL, Mouth Rinse, q12n4p, Hunsucker, Matthew R, MD, 15 mL at 08/04/20 1600 .  melatonin tablet 3 mg, 3 mg, Oral, QHS PRN, Akula, Vijaya, MD, 3 mg at 07/31/20 0123 .  multivitamin with minerals tablet 1 tablet, 1 tablet, Oral, Daily, Akula, Vijaya, MD, 1 tablet at 08/05/20 0900 .  nortriptyline (PAMELOR) capsule 50 mg, 50 mg, Oral, BID, Ellis, Allison L, NP, 50 mg at 08/05/20 0900 .  [DISCONTINUED] ondansetron (ZOFRAN) tablet 4 mg, 4 mg, Oral, Q6H PRN **OR** ondansetron (ZOFRAN) injection 4 mg, 4 mg, Intravenous, Q6H PRN, Yates, Jennifer, MD, 4 mg at 08/04/20 1014 .  oxyCODONE (Oxy IR/ROXICODONE) immediate release tablet 10 mg, 10 mg, Oral, Q4H PRN, Johnson, Clanford L, MD, 10 mg at 08/04/20 1932 .  oxyCODONE (OXYCONTIN) 12 hr tablet 20 mg, 20 mg, Oral, Q12H, Ellis, Allison L, NP, 20 mg at 08/05/20 0901 .  pantoprazole (PROTONIX) EC tablet 40 mg, 40 mg, Oral, Daily, Akula, Vijaya, MD, 40 mg at 08/05/20 0900 .  [START ON 08/06/2020] polyethylene glycol (MIRALAX / GLYCOLAX) packet 17 g, 17 g, Oral, Daily, Johnson, Clanford L, MD .  pregabalin (LYRICA) capsule 300 mg, 300 mg,  Oral, BID, Akula, Vijaya, MD, 300 mg at 08/05/20 0901 .  protein supplement (ENSURE MAX) liquid, 11 oz, Oral, QHS, Akula, Vijaya, MD, 11 oz at 08/04/20 2205 .  saccharomyces boulardii (FLORASTOR) capsule 250 mg, 250 mg, Oral, BID, Johnson, Clanford L, MD, 250 mg at 08/05/20 0901 .  sodium chloride flush (NS) 0.9 % injection 3 mL, 3 mL, Intravenous, Q12H, Yates, Jennifer, MD, 3 mL at 08/05/20 0902 .  warfarin (COUMADIN) tablet 2 mg, 2 mg, Oral, q1600, Martin, Elizabeth J, RPH .  Warfarin - Pharmacist Dosing Inpatient, ,   Does not apply, q1600, Sood, Vineet, MD, Given at 08/03/20 1900  Patients Current Diet:  Diet Order            Diet Carb Modified Fluid consistency: Thin; Room service appropriate? Yes  Diet effective now                 Precautions / Restrictions Precautions Precautions: Fall Precaution Comments: rectal tube, foley catheter, large buttocks wound, hydro on hold Restrictions Weight Bearing Restrictions: No   Has the patient had 2 or more falls or a fall with injury in the past year? Yes  Prior Activity Level Limited Community (1-2x/wk): Went out for appts  Prior Functional Level Self Care: Did the patient need help bathing, dressing, using the toilet or eating? Independent  Indoor Mobility: Did the patient need assistance with walking from room to room (with or without device)? Independent  Stairs: Did the patient need assistance with internal or external stairs (with or without device)? Independent  Functional Cognition: Did the patient need help planning regular tasks such as shopping or remembering to take medications? Independent  Home Assistive Devices / Equipment Home Equipment: Walker - 2 wheels  Prior Device Use: Indicate devices/aids used by the patient prior to current illness, exacerbation or injury? Walker  Current Functional Level Cognition  Overall Cognitive Status: Within Functional Limits for tasks assessed Orientation Level: Oriented  X4 General Comments: Anxiety has decreased since building rapport with therapists.    Extremity Assessment (includes Sensation/Coordination)  Upper Extremity Assessment: Generalized weakness RUE Deficits / Details: AROM, WFLs, fair grip LUE Deficits / Details: AROM, WFLs, fair grip  Lower Extremity Assessment: Defer to PT evaluation    ADLs  Overall ADL's : Needs assistance/impaired Eating/Feeding: Modified independent Eating/Feeding Details (indicate cue type and reason): eating sidelying; using red tubing; difficulty opening soda lids; May benefit from lid opener Grooming: Set up,Sitting,Bed level Upper Body Bathing: Set up,Sitting Lower Body Bathing: Moderate assistance,Sitting/lateral leans,Bed level Upper Body Dressing : Set up,Supervision/safety,Bed level Lower Body Dressing: Total assistance Toilet Transfer: Total assistance,+2 for physical assistance,+2 for safety/equipment Toilet Transfer Details (indicate cue type and reason): staff advised to use maxi sky when recliner gets delivered Functional mobility during ADLs: Maximal assistance,+2 for physical assistance General ADL Comments: Pt states he does 10-90% of his bathing. discussed need to him to attempt to have his HOB in more of an upright position when bathing. Encouraged pt to work on rolling side- side to reach and wash his back. Pt verbalized understanding.    Mobility  Overal bed mobility: Needs Assistance Bed Mobility: Rolling,Sidelying to Sit,Sit to Sidelying Rolling: Modified independent (Device/Increase time) Sidelying to sit: Supervision Supine to sit: Supervision Sit to supine: Min guard Sit to sidelying: Mod assist,+2 for physical assistance General bed mobility comments: Pt able to roll supervision with use of bed rail, supervision to come up to sitting with HOB ~30 degrees and heavy use of rail, extra time needed to complete.  Mod assist of two people to help him get his feet back up on the bed and where he  can push against the foot board to move back towards HOB (when we sit up he gets very far down to the end of the bed and it is difficult for him to flexh his legs and get/stabliize them on the foot board when he frist returns to side lying.  If he were in better position, I believe he could be supervision with this move as well.    Transfers    Overall transfer level: Needs assistance Equipment used: 2 person hand held assist (recliner chair locked and turned backwards towards the pt, third person to stabilize.) Transfers: Sit to/from Stand Sit to Stand: Mod assist,+2 physical assistance General transfer comment: two person mod assist (lighter mod assist today) to come to standing from elevated bed with bil knees blocked and sheet for gait belt.  Pt better able to power up with momentum "one two three" and lock out knees in standing.  On first stand even able to weight shift and get his feet up under himself.  Second and third stand were similiar.  Stood anywhere from 30-45 seconds with each stand today which was longer than yesterday.  Leaning on chair stabilized by third person for support once standing.    Ambulation / Gait / Stairs / Wheelchair Mobility  Ambulation/Gait General Gait Details: We talked about the next steps towards gait progression are weight shifting in standing and side stepping EOB before we come forward away from the bed.    Posture / Balance Dynamic Sitting Balance Sitting balance - Comments: supervision EOB.  EOB time was >20 mins in prep for standing and for multiple attempts.  We spoke about opportunities for him to sit EOB including during meds passing with RN in room, to eat his meals, etc.  to get more upright time. Balance Overall balance assessment: Needs assistance Sitting-balance support: Feet supported,Bilateral upper extremity supported,No upper extremity supported,Single extremity supported Sitting balance-Leahy Scale: Good Sitting balance - Comments: supervision  EOB.  EOB time was >20 mins in prep for standing and for multiple attempts.  We spoke about opportunities for him to sit EOB including during meds passing with RN in room, to eat his meals, etc.  to get more upright time. Standing balance support: Bilateral upper extremity supported Standing balance-Leahy Scale: Poor Standing balance comment: two person lighter mod assist in standing today, got down to min assist for breif moments (<10 seconds) once his knees were locked out.    Special needs/care consideration Skin: wounds on buttocks, thigh, perineum, abrasions on BUEs, Diabetic management: DM2 managed with sQ Novolog and Levemir and Special service needs Kreg Bed, Bariatric equipment,  Above weight limit for portable maximove, will need Sky lift. Pt. Currently has a foley, and will likely need flexiseal replaced.    Previous Home Environment (from acute therapy documentation) Living Arrangements: Parent  Lives With: Other (Comment) Available Help at Discharge: Family,Available 24 hours/day Type of Home: House Home Layout: Able to live on main level with bedroom/bathroom Home Access: Stairs to enter Entrance Stairs-Number of Steps: 6 (6 one entrance, 8 the other) Bathroom Shower/Tub: Tub/shower unit Bathroom Toilet: Handicapped height Bathroom Accessibility: Yes How Accessible: Accessible via walker Home Care Services: No Additional Comments: PT in room got more information from mother in room prior to OTR arrival.  Discharge Living Setting Plans for Discharge Living Setting: Patient's home Type of Home at Discharge: House Discharge Home Layout: One level Discharge Home Access: Stairs to enter Entrance Stairs-Rails: Right,Left,Can reach both Entrance Stairs-Number of Steps: 6 Discharge Bathroom Shower/Tub: Tub/shower unit Discharge Bathroom Accessibility: Yes How Accessible: Accessible via walker Does the patient have any problems obtaining your medications?:  No  Social/Family/Support Systems Patient Roles: Parent Contact Information: [phone] Anticipated Caregiver: Phyllis Yount (mother) Anticipated Caregiver's Contact Information: [phone] Ability/Limitations of Caregiver: Can provide Min A Caregiver Availability: 24/7 Discharge Plan Discussed with Primary Caregiver: Yes Is Caregiver In Agreement with Plan?: No Does Caregiver/Family have Issues with Lodging/Transportation while Pt   is in Rehab?: Yes  Goals Patient/Family Goal for Rehab: PT/OT Min A Expected length of stay: 20-24 days Pt/Family Agrees to Admission and willing to participate: Yes Program Orientation Provided & Reviewed with Pt/Caregiver Including Roles  & Responsibilities: Yes  Decrease burden of Care through IP rehab admission: Specialzed equipment needs, Decrease number of caregivers, Bowel and bladder program and Patient/family education  Possible need for SNF placement upon discharge: not anticipated  Patient Condition: I have reviewed medical records from [facility], spoken with CM, and patient. I met with patient at the bedside for inpatient rehabilitation assessment.  Patient will benefit from ongoing PT and OT, can actively participate in 3 hours of therapy a day 5 days of the week, and can make measurable gains during the admission.  Patient will also benefit from the coordinated team approach during an Inpatient Acute Rehabilitation admission.  The patient will receive intensive therapy as well as Rehabilitation physician, nursing, social worker, and care management interventions.  Due to bladder management, bowel management, safety, skin/wound care, disease management, medication administration, pain management and patient education the patient requires 24 hour a day rehabilitation nursing.  The patient is currently mod+2- max A  with mobility and basic ADLs.  Discharge setting and therapy post discharge at home with home health is anticipated.   Patient has agreed to participate in the Acute Inpatient Rehabilitation Program and will admit today.  Preadmission Screen Completed By:  Zvi Duplantis B Abdishakur Gottschall, 08/05/2020 12:21 PM ______________________________________________________________________   Discussed status with Dr. Raulkar  on 08/05/20 at 1000 and received approval for admission today.  Admission Coordinator:  Offie Waide B Lucky Alverson, CCC-SLP, time 1400/Date 08/05/20  Assessment/Plan: Diagnosis: Debility secondary to DKA and necrotizing soft tissue infection 1. Does the need for close, 24 hr/day Medical supervision in concert with the patient's rehab needs make it unreasonable for this patient to be served in a less intensive setting? Yes 2. Co-Morbidities requiring supervision/potential complications:  1. Morbid obesity (BMI 78.51) 2. Sepsis 3. Necrotizing fasciitis 4. S/p trach 5. Type 2 DM 3. Due to bladder management, bowel management, safety, skin/wound care, disease management, medication administration, pain management and patient education, does the patient require 24 hr/day rehab nursing? Yes 4. Does the patient require coordinated care of a physician, rehab nurse, PT, OT to address physical and functional deficits in the context of the above medical diagnosis(es)? Yes Addressing deficits in the following areas: balance, endurance, locomotion, strength, transferring, bowel/bladder control, bathing, dressing, feeding, grooming, toileting and psychosocial support 5. Can the patient actively participate in an intensive therapy program of at least 3 hrs of therapy 5 days a week? Yes 6. The potential for patient to make measurable gains while on inpatient rehab is good 7. Anticipated functional outcomes upon discharge from inpatient rehab: mod assist PT, mod assist OT, independent SLP. Father will be able to provide modA.  8. Estimated rehab length of stay to reach the above functional goals is: 2-3 weeks 9. Anticipated discharge destination:  Home 10. Overall Rehab/Functional Prognosis: excellent and good   MD Signature: Krutika Raulkar, MD 

## 2020-08-05 NOTE — Progress Notes (Signed)
PROGRESS NOTE   Carl Hill  NID:[id number] DOB: [birth year] DOA: 06/10/2020 PCP: Malka So., MD   Chief Complaint  Patient presents with  . Hyperglycemia   Level of care: Med-Surg  Brief Admission History:  50 y.o. male with medical history significant of morbid obesity; HTN; DM with foot ulcers; and afib presenting with hyperglycemia.  He was found to have a gluteal abscesses and cellulitis, sepsis with septic shock, treated in the OR with irrigation and debridement followed by a prolonged complicated hospital course.  Recently, hospital course complicated by severe constipation with stool impaction treated with laxatives and enemas and now resolved.   Assessment & Plan:   Principal Problem:   Fournier gangrene Active Problems:   Sepsis (HCC)   DKA (diabetic ketoacidosis) (HCC)   AKI (acute kidney injury) (HCC)   Diabetic ulcer of heel (HCC)   Atrial fibrillation, chronic (HCC)   Essential hypertension   Dyslipidemia   Acquired hypothyroidism   Chronic pain disorder   Morbid obesity with BMI of 60.0-69.9, adult (HCC)   Acute respiratory failure (HCC)   Sepsis due to Streptococcus, group B (HCC)   Actinomycosis  1. Sepsis with septic shock - RESOLVED - secondary to necrotizing fasciitis with E coli, GBS and peptostreptocossus. ID consulted and recommended Unasyn followed by 6 month course of oral augmentin due to actinomyces in wound.  2. Acute on chronic hypercapneic respiratory failure, OHS, HCAP - Pneumonia has been fully treated and resolve. Pt now s/p tracheostomy which is now out.   3. Opioid induced Chronic constipation with fecal impaction - Pt was severely constipated due to high doses of opioids given. I reached out again to [facility] attending team on 3/6 and they agreed to see him.  They recommend continued laxatives and enema therapy until stool ball loosens up and passes.  He was given SMOG enema and several doses of mag citrate with good results.  Surgery says  he may benefit from gastrograffin enema but was not required and not given.  I tried but was unable to manually disimpact him at bedside.  He may benefit from something like Movantic if he continues to require high dose opioids for pain mgmt.  I have been slowly weaning his high dose opioids.  4. 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  Reducing CBG testing to [facility].  5. Chronic atrial fibrillation / acquired thrombophilia - heart rates suboptimally controlled but tolerable given his acute situation.  Continue amiodarone and diltiazem at current dosing.  Warfarin for anticoagulation. Monitor PT.  Pharm D assisting with warfarin dosing.  Outpatient follow up with cardiology recommended.  DC cardiac monitor, transfer to med-surg.  6. Hypothyroidism - stable on home levothyroxine.  7. ABLA - stable Hg. Following intermittently with CBC testing.    DVT prophylaxis: warfarin  Code Status: full  Family Communication: bedside update 3/6 Disposition: CIR  Status is: Inpatient  Remains inpatient appropriate because:IV treatments appropriate due to intensity of illness or inability to take PO and Inpatient level of care appropriate due to severity of illness  Dispo: The patient is from: Home              Anticipated d/c is to: CIR              Patient currently is medically stable to d/c.   Difficult to place patient Yes   Consultants:   CCS - s/o, reconsulted 3/5 and 3/6  PCCM - s/o  Nephrology - s/o  ID - s/o  Procedures:   Irrigation and debridement of abscesses 1/10, 1/11  Antimicrobials:  augmentin oral therapy   Subjective: Pt has had several large bowel movements, abdominal cramping and pain has resolved.  I had a long conversation with him that I felt that it was medically prudent to start weaning opioids and he verbalized understanding.      Objective: Vitals:   08/04/20 1600 08/04/20 2056  08/05/20 0500 08/05/20 0527  BP: (!) 112/55 100/68  119/63  Pulse: (!) 107 89  (!) 104  Resp: 20 18  18   Temp: 98 F (36.7 C) 97.9 F (36.6 C)  97.7 F (36.5 C)  TempSrc: Oral Oral    SpO2: 98% 95%  92%  Weight:   (!) 248.2 kg   Height:        Intake/Output Summary (Last 24 hours) at 08/05/2020 1038 Last data filed at 08/05/2020 0747 Gross per 24 hour  Intake 1860 ml  Output 600 ml  Net 1260 ml   Filed Weights   08/02/20 0500 08/04/20 0500 08/05/20 0500  Weight: (!) 252.1 kg (!) 250.7 kg (!) 248.2 kg    Examination:  General exam: super-morbidly obese male, lying on stomach, his agitation has resolved completely now.  Respiratory system: Shallow BS, no crackles or rales heard.  Cardiovascular system: normal S1 & S2 heard. No JVD, murmurs, rubs, gallops or clicks. 2+ pedal edema. Gastrointestinal system: Abdomen is superobese, nondistended, soft and diffusely tender. No organomegaly or masses felt. Distant bowel sounds heard. Central nervous system: Alert and oriented x4. No focal neurological deficits. Extremities: 1+ pretibial edema BLEs. Symmetric 5 x 5 power. Skin: perineal wounds slowly healing.  Psychiatry: Judgement and insight appear poor. Mood & affect appropriate.   Data Reviewed: I have personally reviewed following labs and imaging studies  CBC: Recent Labs  Lab 08/01/20 0437 08/03/20 0139  WBC 6.4 7.2  HGB 9.2* 8.9*  HCT 30.5* 30.4*  MCV 87.4 87.4  PLT 307 322    Basic Metabolic Panel: Recent Labs  Lab 08/03/20 0139 08/03/20 0835 08/04/20 0039 08/05/20 0515  NA 135  --  135 133*  K 3.3*  --  3.2* 3.6  CL 96*  --  96* 93*  CO2 31  --  27 29  GLUCOSE 128*  --  146* 132*  BUN 7  --  6 9  CREATININE 0.92  --  0.79 1.04  CALCIUM 8.2*  --  8.3* 8.3*  MG  --  1.7 1.8 2.5*    GFR: Estimated Creatinine Clearance: 175.8 mL/min (by C-G formula based on SCr of 1.04 mg/dL).  Liver Function Tests: Recent Labs  Lab 08/03/20 0139  AST 23  ALT 17   ALKPHOS 86  BILITOT 0.5  PROT 6.3*  ALBUMIN 2.0*    CBG: Recent Labs  Lab 08/04/20 0729 08/04/20 1301 08/04/20 1850 08/04/20 2058 08/05/20 0757  GLUCAP 123* 138* 190* 162* 184*    No results found for this or any previous visit (from the past 240 hour(s)).   Radiology Studies: No results found. Scheduled Meds: . amiodarone  200 mg Oral Daily  . amoxicillin-clavulanate  1 tablet Oral Q8H  . atorvastatin  20 mg Oral QPM  . chlorhexidine  15 mL Mouth Rinse BID  . Chlorhexidine Gluconate Cloth  6 each Topical Daily  . diltiazem  240 mg Oral Daily  . docusate sodium  100 mg Oral BID  . feeding  supplement  237 mL Oral BID BM  . insulin aspart  0-20 Units Subcutaneous TID WC  . insulin aspart  0-5 Units Subcutaneous QHS  . insulin aspart  18 Units Subcutaneous TID WC  . insulin detemir  42 Units Subcutaneous BID  . levothyroxine  75 mcg Oral Daily  . mouth rinse  15 mL Mouth Rinse q12n4p  . multivitamin with minerals  1 tablet Oral Daily  . nortriptyline  50 mg Oral BID  . oxyCODONE  20 mg Oral Q12H  . pantoprazole  40 mg Oral Daily  . [START ON 08/06/2020] polyethylene glycol  17 g Oral Daily  . pregabalin  300 mg Oral BID  . Ensure Max Protein  11 oz Oral QHS  . saccharomyces boulardii  250 mg Oral BID  . sodium chloride flush  3 mL Intravenous Q12H  . warfarin  2.5 mg Oral q1600  . Warfarin - Pharmacist Dosing Inpatient   Does not apply q1600   Continuous Infusions: . sodium chloride Stopped (06/28/20 2035)    LOS: 56 days   Time spent: 40 mins   Zayven Powe Laural Benes, MD How to contact the [facility] Attending or Consulting provider 7A - 7P or covering provider during after hours 7P -7A, for this patient?  1. Check the care team in [facility] and look for a) attending/consulting [facility] provider listed and b) the [facility] team listed 2. Log into [email or web address] and use [place]'s universal password to access. If you do not have the password, please contact the hospital  operator. 3. Locate the [facility] provider you are looking for under [facility] and page to a number that you can be directly reached. 4. If you still have difficulty reaching the provider, please page the [facility] (Director on Call) for the Hospitalists listed on amion for assistance.  08/05/2020, 10:38 AM

## 2020-08-05 NOTE — Progress Notes (Signed)
Inpatient Rehab Admissions Coordinator:   I have a bed on CIR for this patient today. Rn may call report to [phone].  Megan Salon, MS, CCC-SLP Rehab Admissions Coordinator  [phone] (celll) [phone] (office)

## 2020-08-06 ENCOUNTER — Encounter (HOSPITAL_COMMUNITY): Payer: Self-pay | Admitting: Physical Medicine and Rehabilitation

## 2020-08-06 ENCOUNTER — Other Ambulatory Visit: Payer: Self-pay

## 2020-08-06 ENCOUNTER — Inpatient Hospital Stay (HOSPITAL_COMMUNITY)
Admission: RE | Admit: 2020-08-06 | Discharge: 2020-09-12 | DRG: 945 | Disposition: A | Discharge status: Home Health | Payer: Medicaid Other | Source: Intra-hospital | Attending: Physical Medicine and Rehabilitation | Admitting: Physical Medicine and Rehabilitation

## 2020-08-06 DIAGNOSIS — I482 Chronic atrial fibrillation, unspecified: Secondary | ICD-10-CM | POA: Diagnosis present

## 2020-08-06 DIAGNOSIS — R159 Full incontinence of feces: Secondary | ICD-10-CM | POA: Diagnosis present

## 2020-08-06 DIAGNOSIS — I10 Essential (primary) hypertension: Secondary | ICD-10-CM | POA: Diagnosis present

## 2020-08-06 DIAGNOSIS — N493 Fournier gangrene: Secondary | ICD-10-CM

## 2020-08-06 DIAGNOSIS — M533 Sacrococcygeal disorders, not elsewhere classified: Secondary | ICD-10-CM

## 2020-08-06 DIAGNOSIS — E1142 Type 2 diabetes mellitus with diabetic polyneuropathy: Secondary | ICD-10-CM | POA: Diagnosis not present

## 2020-08-06 DIAGNOSIS — R5381 Other malaise: Secondary | ICD-10-CM | POA: Diagnosis present

## 2020-08-06 DIAGNOSIS — K649 Unspecified hemorrhoids: Secondary | ICD-10-CM | POA: Diagnosis present

## 2020-08-06 DIAGNOSIS — E871 Hypo-osmolality and hyponatremia: Secondary | ICD-10-CM | POA: Diagnosis present

## 2020-08-06 DIAGNOSIS — R7309 Other abnormal glucose: Secondary | ICD-10-CM | POA: Diagnosis present

## 2020-08-06 DIAGNOSIS — K6289 Other specified diseases of anus and rectum: Secondary | ICD-10-CM

## 2020-08-06 DIAGNOSIS — G8929 Other chronic pain: Secondary | ICD-10-CM | POA: Diagnosis present

## 2020-08-06 DIAGNOSIS — F17210 Nicotine dependence, cigarettes, uncomplicated: Secondary | ICD-10-CM | POA: Diagnosis present

## 2020-08-06 DIAGNOSIS — M62838 Other muscle spasm: Secondary | ICD-10-CM | POA: Diagnosis present

## 2020-08-06 DIAGNOSIS — L89612 Pressure ulcer of right heel, stage 2: Secondary | ICD-10-CM | POA: Diagnosis present

## 2020-08-06 DIAGNOSIS — Z833 Family history of diabetes mellitus: Secondary | ICD-10-CM | POA: Diagnosis not present

## 2020-08-06 DIAGNOSIS — Z6841 Body Mass Index (BMI) 40.0 and over, adult: Secondary | ICD-10-CM | POA: Diagnosis not present

## 2020-08-06 DIAGNOSIS — M25361 Other instability, right knee: Secondary | ICD-10-CM | POA: Diagnosis present

## 2020-08-06 DIAGNOSIS — M2351 Chronic instability of knee, right knee: Secondary | ICD-10-CM | POA: Diagnosis present

## 2020-08-06 DIAGNOSIS — Z8719 Personal history of other diseases of the digestive system: Secondary | ICD-10-CM

## 2020-08-06 DIAGNOSIS — E785 Hyperlipidemia, unspecified: Secondary | ICD-10-CM | POA: Diagnosis present

## 2020-08-06 DIAGNOSIS — R339 Retention of urine, unspecified: Secondary | ICD-10-CM | POA: Diagnosis present

## 2020-08-06 DIAGNOSIS — F419 Anxiety disorder, unspecified: Secondary | ICD-10-CM | POA: Diagnosis present

## 2020-08-06 DIAGNOSIS — Z79890 Hormone replacement therapy (postmenopausal): Secondary | ICD-10-CM | POA: Diagnosis not present

## 2020-08-06 DIAGNOSIS — T402X5A Adverse effect of other opioids, initial encounter: Secondary | ICD-10-CM

## 2020-08-06 DIAGNOSIS — Z794 Long term (current) use of insulin: Secondary | ICD-10-CM

## 2020-08-06 DIAGNOSIS — Z79899 Other long term (current) drug therapy: Secondary | ICD-10-CM | POA: Diagnosis not present

## 2020-08-06 DIAGNOSIS — E039 Hypothyroidism, unspecified: Secondary | ICD-10-CM | POA: Diagnosis present

## 2020-08-06 DIAGNOSIS — E11621 Type 2 diabetes mellitus with foot ulcer: Secondary | ICD-10-CM | POA: Diagnosis present

## 2020-08-06 DIAGNOSIS — K5903 Drug induced constipation: Secondary | ICD-10-CM

## 2020-08-06 DIAGNOSIS — Z7901 Long term (current) use of anticoagulants: Secondary | ICD-10-CM | POA: Diagnosis not present

## 2020-08-06 DIAGNOSIS — E876 Hypokalemia: Secondary | ICD-10-CM | POA: Diagnosis present

## 2020-08-06 DIAGNOSIS — M726 Necrotizing fasciitis: Secondary | ICD-10-CM | POA: Diagnosis present

## 2020-08-06 DIAGNOSIS — E162 Hypoglycemia, unspecified: Secondary | ICD-10-CM | POA: Clinically undetermined

## 2020-08-06 DIAGNOSIS — D638 Anemia in other chronic diseases classified elsewhere: Secondary | ICD-10-CM | POA: Diagnosis present

## 2020-08-06 DIAGNOSIS — J45909 Unspecified asthma, uncomplicated: Secondary | ICD-10-CM | POA: Diagnosis present

## 2020-08-06 DIAGNOSIS — E11649 Type 2 diabetes mellitus with hypoglycemia without coma: Secondary | ICD-10-CM | POA: Diagnosis not present

## 2020-08-06 DIAGNOSIS — E1140 Type 2 diabetes mellitus with diabetic neuropathy, unspecified: Secondary | ICD-10-CM | POA: Diagnosis present

## 2020-08-06 DIAGNOSIS — K594 Anal spasm: Secondary | ICD-10-CM | POA: Diagnosis present

## 2020-08-06 DIAGNOSIS — L97419 Non-pressure chronic ulcer of right heel and midfoot with unspecified severity: Secondary | ICD-10-CM | POA: Diagnosis present

## 2020-08-06 LAB — GLUCOSE, CAPILLARY
Glucose-Capillary: 80 mg/dL (ref 70–99)
Glucose-Capillary: 138 mg/dL — ABNORMAL HIGH (ref 70–99)
Glucose-Capillary: 161 mg/dL — ABNORMAL HIGH (ref 70–99)
Glucose-Capillary: 101 mg/dL — ABNORMAL HIGH (ref 70–99)

## 2020-08-06 LAB — MAGNESIUM: Magnesium: 2.1 mg/dL (ref 1.7–2.4)

## 2020-08-06 MED ORDER — INSULIN DETEMIR 100 UNIT/ML ~~LOC~~ SOLN
42.0000 [IU] | Freq: Two times a day (BID) | SUBCUTANEOUS | Status: DC
  Administered 2020-08-06 – 2020-08-15 (×19): 42 [IU] via SUBCUTANEOUS
  Filled 2020-08-06 (×23): qty 0.42

## 2020-08-06 MED ORDER — ALBUTEROL SULFATE (2.5 MG/3ML) 0.083% IN NEBU: 2.5000 mg | INHALATION_SOLUTION | RESPIRATORY_TRACT | Status: DC | PRN

## 2020-08-06 MED ORDER — ORAL CARE MOUTH RINSE
15.0000 mL | Freq: Two times a day (BID) | OROMUCOSAL | Status: DC
  Administered 2020-08-09 – 2020-09-11 (×38): 15 mL via OROMUCOSAL

## 2020-08-06 MED ORDER — SACCHAROMYCES BOULARDII 250 MG PO CAPS
250.0000 mg | ORAL_CAPSULE | Freq: Two times a day (BID) | ORAL | Status: DC
  Administered 2020-08-06 – 2020-09-11 (×74): 250 mg via ORAL
  Filled 2020-08-06 (×74): qty 1

## 2020-08-06 MED ORDER — DILTIAZEM HCL ER COATED BEADS 120 MG PO CP24
240.0000 mg | ORAL_CAPSULE | Freq: Every day | ORAL | Status: DC
  Administered 2020-08-06 – 2020-09-12 (×38): 240 mg via ORAL
  Filled 2020-08-06 (×38): qty 2

## 2020-08-06 MED ORDER — INSULIN ASPART 100 UNIT/ML ~~LOC~~ SOLN
18.0000 [IU] | Freq: Three times a day (TID) | SUBCUTANEOUS | Status: DC
  Administered 2020-08-08 – 2020-08-12 (×12): 18 [IU] via SUBCUTANEOUS
  Administered 2020-08-13 (×2): 9 [IU] via SUBCUTANEOUS
  Administered 2020-08-13 – 2020-08-14 (×3): 18 [IU] via SUBCUTANEOUS

## 2020-08-06 MED ORDER — WARFARIN SODIUM 2 MG PO TABS
2.0000 mg | ORAL_TABLET | Freq: Every day | ORAL | Status: DC
  Administered 2020-08-06: 2 mg via ORAL
  Filled 2020-08-06: qty 1

## 2020-08-06 MED ORDER — INSULIN ASPART 100 UNIT/ML ~~LOC~~ SOLN: 0.0000 [IU] | Freq: Every day | SUBCUTANEOUS | Status: DC

## 2020-08-06 MED ORDER — LEVOTHYROXINE SODIUM 75 MCG PO TABS
75.0000 ug | ORAL_TABLET | Freq: Every day | ORAL | Status: DC
  Administered 2020-08-06 – 2020-09-12 (×38): 75 ug via ORAL
  Filled 2020-08-06 (×38): qty 1

## 2020-08-06 MED ORDER — ENSURE MAX PROTEIN PO LIQD
11.0000 [oz_av] | Freq: Three times a day (TID) | ORAL | Status: DC
  Administered 2020-08-06 – 2020-09-11 (×112): 11 [oz_av] via ORAL
  Filled 2020-08-06: qty 660

## 2020-08-06 MED ORDER — PROCHLORPERAZINE 25 MG RE SUPP: 12.5000 mg | Freq: Four times a day (QID) | RECTAL | Status: DC | PRN

## 2020-08-06 MED ORDER — BISACODYL 10 MG RE SUPP
10.0000 mg | Freq: Every day | RECTAL | Status: DC | PRN
  Filled 2020-08-06: qty 1

## 2020-08-06 MED ORDER — ONDANSETRON HCL 4 MG/2ML IJ SOLN: 4.0000 mg | Freq: Four times a day (QID) | INTRAMUSCULAR | Status: DC | PRN

## 2020-08-06 MED ORDER — ADULT MULTIVITAMIN W/MINERALS CH
1.0000 | ORAL_TABLET | Freq: Every day | ORAL | Status: DC
  Administered 2020-08-06 – 2020-09-11 (×37): 1 via ORAL
  Filled 2020-08-06 (×37): qty 1

## 2020-08-06 MED ORDER — RIVAROXABAN 20 MG PO TABS: 20.0000 mg | ORAL_TABLET | Freq: Every day | ORAL | Status: DC

## 2020-08-06 MED ORDER — GUAIFENESIN-DM 100-10 MG/5ML PO SYRP: 5.0000 mL | ORAL_SOLUTION | Freq: Four times a day (QID) | ORAL | Status: DC | PRN

## 2020-08-06 MED ORDER — DOCUSATE SODIUM 100 MG PO CAPS
100.0000 mg | ORAL_CAPSULE | Freq: Two times a day (BID) | ORAL | Status: DC
  Filled 2020-08-06: qty 1

## 2020-08-06 MED ORDER — PROCHLORPERAZINE EDISYLATE 10 MG/2ML IJ SOLN: 5.0000 mg | Freq: Four times a day (QID) | INTRAMUSCULAR | Status: DC | PRN

## 2020-08-06 MED ORDER — PANTOPRAZOLE SODIUM 40 MG PO TBEC
40.0000 mg | DELAYED_RELEASE_TABLET | Freq: Every day | ORAL | Status: DC
  Administered 2020-08-06 – 2020-09-12 (×38): 40 mg via ORAL
  Filled 2020-08-06 (×38): qty 1

## 2020-08-06 MED ORDER — INSULIN ASPART 100 UNIT/ML ~~LOC~~ SOLN
0.0000 [IU] | Freq: Three times a day (TID) | SUBCUTANEOUS | Status: DC
  Administered 2020-08-06: 3 [IU] via SUBCUTANEOUS
  Administered 2020-08-06: 4 [IU] via SUBCUTANEOUS
  Administered 2020-08-07 – 2020-08-09 (×2): 3 [IU] via SUBCUTANEOUS
  Administered 2020-08-10: 4 [IU] via SUBCUTANEOUS
  Administered 2020-08-11 – 2020-08-28 (×18): 3 [IU] via SUBCUTANEOUS
  Administered 2020-08-28: 4 [IU] via SUBCUTANEOUS
  Administered 2020-08-29: 3 [IU] via SUBCUTANEOUS
  Administered 2020-08-29: 13 [IU] via SUBCUTANEOUS
  Administered 2020-08-30 – 2020-09-03 (×9): 3 [IU] via SUBCUTANEOUS
  Administered 2020-09-04: 4 [IU] via SUBCUTANEOUS
  Administered 2020-09-04: 3 [IU] via SUBCUTANEOUS
  Administered 2020-09-05: 4 [IU] via SUBCUTANEOUS
  Administered 2020-09-05: 3 [IU] via SUBCUTANEOUS
  Administered 2020-09-06: 4 [IU] via SUBCUTANEOUS
  Administered 2020-09-07 – 2020-09-10 (×6): 3 [IU] via SUBCUTANEOUS
  Administered 2020-09-11: 4 [IU] via SUBCUTANEOUS
  Administered 2020-09-11 – 2020-09-12 (×2): 3 [IU] via SUBCUTANEOUS

## 2020-08-06 MED ORDER — LACTULOSE 10 GM/15ML PO SOLN: 10.0000 g | ORAL | Status: DC | PRN

## 2020-08-06 MED ORDER — HYDROMORPHONE HCL 1 MG/ML IJ SOLN
0.5000 mg | Freq: Two times a day (BID) | INTRAMUSCULAR | Status: DC | PRN
Start: 2020-08-06 — End: 2020-08-09
  Administered 2020-08-08 – 2020-08-09 (×3): 0.5 mg via INTRAVENOUS
  Filled 2020-08-06 (×3): qty 0.5

## 2020-08-06 MED ORDER — POLYETHYLENE GLYCOL 3350 17 G PO PACK
17.0000 g | PACK | Freq: Every day | ORAL | Status: DC
  Administered 2020-08-07 – 2020-08-11 (×4): 17 g via ORAL
  Filled 2020-08-06 (×7): qty 1

## 2020-08-06 MED ORDER — POLYETHYLENE GLYCOL 3350 17 G PO PACK
17.0000 g | PACK | Freq: Every day | ORAL | Status: DC | PRN
  Administered 2020-08-15 – 2020-08-30 (×4): 17 g via ORAL
  Filled 2020-08-06 (×4): qty 1

## 2020-08-06 MED ORDER — PREGABALIN 75 MG PO CAPS
300.0000 mg | ORAL_CAPSULE | Freq: Two times a day (BID) | ORAL | Status: DC
  Administered 2020-08-06 – 2020-09-12 (×75): 300 mg via ORAL
  Filled 2020-08-06 (×76): qty 4

## 2020-08-06 MED ORDER — AMOXICILLIN-POT CLAVULANATE 875-125 MG PO TABS
1.0000 | ORAL_TABLET | Freq: Three times a day (TID) | ORAL | Status: DC
  Administered 2020-08-06 – 2020-09-12 (×112): 1 via ORAL
  Filled 2020-08-06 (×112): qty 1

## 2020-08-06 MED ORDER — FLEET ENEMA 7-19 GM/118ML RE ENEM
1.0000 | ENEMA | Freq: Once | RECTAL | Status: AC | PRN
  Administered 2020-08-17: 1 via RECTAL
  Filled 2020-08-06: qty 1

## 2020-08-06 MED ORDER — MELATONIN 3 MG PO TABS
3.0000 mg | ORAL_TABLET | Freq: Every evening | ORAL | Status: DC | PRN
  Administered 2020-08-16 – 2020-09-06 (×6): 3 mg via ORAL
  Filled 2020-08-06 (×6): qty 1

## 2020-08-06 MED ORDER — OXYCODONE HCL ER 10 MG PO T12A
20.0000 mg | EXTENDED_RELEASE_TABLET | Freq: Two times a day (BID) | ORAL | Status: DC
  Administered 2020-08-06 – 2020-08-31 (×51): 20 mg via ORAL
  Filled 2020-08-06 (×51): qty 2

## 2020-08-06 MED ORDER — WARFARIN - PHARMACIST DOSING INPATIENT: Freq: Every day | Status: DC

## 2020-08-06 MED ORDER — PROCHLORPERAZINE MALEATE 5 MG PO TABS: 5.0000 mg | ORAL_TABLET | Freq: Four times a day (QID) | ORAL | Status: DC | PRN

## 2020-08-06 MED ORDER — ACETAMINOPHEN 325 MG PO TABS
325.0000 mg | ORAL_TABLET | ORAL | Status: DC | PRN
  Administered 2020-08-09 – 2020-09-09 (×9): 650 mg via ORAL
  Filled 2020-08-06 (×9): qty 2

## 2020-08-06 MED ORDER — NORTRIPTYLINE HCL 25 MG PO CAPS
50.0000 mg | ORAL_CAPSULE | Freq: Two times a day (BID) | ORAL | Status: DC
  Administered 2020-08-06 – 2020-09-12 (×75): 50 mg via ORAL
  Filled 2020-08-06 (×78): qty 2

## 2020-08-06 MED ORDER — CHLORHEXIDINE GLUCONATE 0.12 % MT SOLN
15.0000 mL | Freq: Two times a day (BID) | OROMUCOSAL | Status: DC
  Administered 2020-08-06 – 2020-09-11 (×40): 15 mL via OROMUCOSAL
  Filled 2020-08-06 (×61): qty 15

## 2020-08-06 MED ORDER — OXYCODONE HCL 5 MG PO TABS
10.0000 mg | ORAL_TABLET | ORAL | Status: DC | PRN
  Administered 2020-08-06 – 2020-09-10 (×57): 10 mg via ORAL
  Filled 2020-08-06 (×60): qty 2

## 2020-08-06 MED ORDER — ALUM & MAG HYDROXIDE-SIMETH 200-200-20 MG/5ML PO SUSP: 30.0000 mL | ORAL | Status: DC | PRN

## 2020-08-06 MED ORDER — DOCUSATE SODIUM 100 MG PO CAPS
100.0000 mg | ORAL_CAPSULE | Freq: Every day | ORAL | Status: DC
  Administered 2020-08-07 – 2020-09-11 (×36): 100 mg via ORAL
  Filled 2020-08-06 (×37): qty 1

## 2020-08-06 MED ORDER — AMIODARONE HCL 200 MG PO TABS
200.0000 mg | ORAL_TABLET | Freq: Every day | ORAL | Status: DC
  Administered 2020-08-06 – 2020-09-11 (×37): 200 mg via ORAL
  Filled 2020-08-06 (×37): qty 1

## 2020-08-06 MED ORDER — DIPHENHYDRAMINE HCL 12.5 MG/5ML PO ELIX: 12.5000 mg | ORAL_SOLUTION | Freq: Four times a day (QID) | ORAL | Status: DC | PRN

## 2020-08-06 MED ORDER — ATORVASTATIN CALCIUM 10 MG PO TABS
20.0000 mg | ORAL_TABLET | Freq: Every evening | ORAL | Status: DC
  Administered 2020-08-06 – 2020-09-11 (×37): 20 mg via ORAL
  Filled 2020-08-06 (×38): qty 2

## 2020-08-06 MED ORDER — CHLORHEXIDINE GLUCONATE CLOTH 2 % EX PADS
6.0000 | MEDICATED_PAD | Freq: Every day | CUTANEOUS | Status: DC
  Administered 2020-08-06 – 2020-09-11 (×34): 6 via TOPICAL

## 2020-08-06 MED ORDER — METHOCARBAMOL 500 MG PO TABS
500.0000 mg | ORAL_TABLET | Freq: Four times a day (QID) | ORAL | Status: DC | PRN
  Administered 2020-08-07 – 2020-08-13 (×16): 500 mg via ORAL
  Filled 2020-08-06 (×17): qty 1

## 2020-08-06 NOTE — Progress Notes (Addendum)
ANTICOAGULATION CONSULT NOTE  Pharmacy Consult for warfarin>Xarelto Indication: atrial fibrillation  Allergies  Allergen Reactions  . Bee Venom Anaphylaxis    Other reaction(s): Unknown Other reaction(s): Unknown Other reaction(s): Unknown   . Sglt2 Inhibitors Other (See Comments)    Necrotizing infection of the perineum  . Other Hives    Patient Measurements: Height: 5\' 10"  (177.8 cm) Weight: (!) 246.3 kg (542 lb 15.9 oz) IBW/kg (Calculated) : 73  Vital Signs: Temp: 97.6 F (36.4 C) (03/08 0551) Temp Source: Oral (03/08 0551) BP: 108/71 (03/08 0551) Pulse Rate: 99 (03/08 0551)  Labs: Recent Labs    08/04/20 0039 08/05/20 0515  LABPROT  --  31.3*  INR  --  3.1*  CREATININE 0.79 1.04    Estimated Creatinine Clearance: 174.8 mL/min (by C-G formula based on SCr of 1.04 mg/dL).   Medications:  Scheduled:  . amiodarone  200 mg Oral Daily  . amoxicillin-clavulanate  1 tablet Oral Q8H  . atorvastatin  20 mg Oral QPM  . chlorhexidine  15 mL Mouth Rinse BID  . Chlorhexidine Gluconate Cloth  6 each Topical Daily  . diltiazem  240 mg Oral Daily  . [START ON 08/07/2020] docusate sodium  100 mg Oral Daily  . insulin aspart  0-20 Units Subcutaneous TID WC  . insulin aspart  0-5 Units Subcutaneous QHS  . insulin aspart  18 Units Subcutaneous TID WC  . insulin detemir  42 Units Subcutaneous BID  . levothyroxine  75 mcg Oral Daily  . mouth rinse  15 mL Mouth Rinse q12n4p  . multivitamin with minerals  1 tablet Oral Daily  . nortriptyline  50 mg Oral BID  . oxyCODONE  20 mg Oral Q12H  . pantoprazole  40 mg Oral Daily  . polyethylene glycol  17 g Oral Daily  . pregabalin  300 mg Oral BID  . Ensure Max Protein  11 oz Oral TID WC & HS  . saccharomyces boulardii  250 mg Oral BID  . warfarin  2 mg Oral q1600  . Warfarin - Pharmacist Dosing Inpatient   Does not apply q1600    Assessment: 50 yo M with prolonged inpatient admission from 1/10 >> 3/7 with necrotizing soft  tissue infection requiring multiple I&Ds and ongoing wound care.  Patient was transferred to inpatient rehab on 3/8.  Prior to admission, patient was on Xarelto for hx PAF.  Pt presented with AKI (now resolved).  Given his obesity and AKI, the decision was made to switch the patient to warfarin for anticoagulation.  The patient has been stable on 2-3mg  daily doses for the past few weeks with INR checks reduced to 2xweek.  Last INR 3/7 was 3.1.  Ordered to go back to Xarelto since AKI has resolved. We will resume it when INR is <2. Dr. 5/7 is aware.   Goal of Therapy:  INR 2-3 Monitor platelets by anticoagulation protocol: Yes   Plan:  Continue warfarin 2mg  daily. INR check Mon/Thurs  Berline Chough, Pharm.D., BCPS Clinical Pharmacist Clinical phone for 08/06/2020 from 8:30-4:00 is [phone].  **Pharmacist phone directory can be found on [email or web address] listed under [facility].  08/06/2020 9:32 AM   Addendum  Dc coumadin Daily INR Resume Xarelto when [facility], PharmD, BCIDP, AAHIVP, CPP Infectious Disease Pharmacist 08/06/2020 4:42 PM

## 2020-08-06 NOTE — Progress Notes (Signed)
Occupational Therapy Session Note  Patient Details  Name: Carl Hill MRN: [id number] Date of Birth: Dec 25, 1970  Today's Date: 08/06/2020 OT Individual Time: 7017-7939 OT Individual Time Calculation (min): 55 min    Short Term Goals: Week 1:  OT Short Term Goal 1 (Week 1): Patient will tolerate sitting EOB for 15 minutes in preparation for BADL task OT Short Term Goal 2 (Week 1): Patient will complete sit<>stand with max A +2 in prep for transfer OT Short Term Goal 3 (Week 1): Patient will complete 50% of U Bbathing task while seated EOB.  Skilled Therapeutic Interventions/Progress Updates:  Pt resting in bed upon arrival with mother present. OT intervention with focus on bed mobility, discharge planning, review of POC, and activity tolerance. Pt's mother present during session and provided information in addition to pt. Supine>sit EOB with CGA but pt unable to remain seated EOB 2/2 increased pain on buttocks. Pt in agreement with LTGs. Discussed use of Bariatric chair and goals regarding sitting tolerance. Pt remained in bed with all needs within reach and mother present.   Therapy Documentation Precautions:  Precautions Precautions: Fall Precaution Comments: foley catheter, large buttocks wound, R heel wound Restrictions Weight Bearing Restrictions: No   Pain: Pain Assessment Pain Scale: 0-10 Pain Score: 8 Pain Type: Surgical pain Pain Location: Buttocks Pain Orientation: Left;Right;Medial   Therapy/Group: Individual Therapy  [name] 08/06/2020, 2:31 PM

## 2020-08-06 NOTE — Evaluation (Signed)
Occupational Therapy Assessment and Plan  Patient Details  Name: Carl Hill MRN: [id number] Date of Birth: [birth year]  OT Diagnosis: muscle weakness (generalized) and low activity tolerance Rehab Potential: Rehab Potential (ACUTE ONLY): Good ELOS: 4 weeks   Today's Date: 08/06/2020 OT Individual Time: 3664-4034 OT Individual Time Calculation (min): 57 min     Hospital Problem: Principal Problem:   Debility   Past Medical History:  Past Medical History:  Diagnosis Date  . 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 Asthma   . Atrial fibrillation ([place])   . Diabetes mellitus without complication ([place])   . History of cardioversion    3 times   . Hypertension   . Sepsis due to Streptococcus, group B ([place]) 06/22/2020   Past Surgical History:  Past Surgical History:  Procedure Laterality Date  . ABDOMINAL SURGERY    . INCISION AND DRAINAGE PERIRECTAL ABSCESS N/A 06/11/2020   Procedure: IRRIGATION AND DEBRIDEMENT PERINEAL WOUND;  Surgeon: Dwan Bolt, MD;  Location: [place] [facility];  Service: General;  Laterality: N/A;  . IRRIGATION AND DEBRIDEMENT ABSCESS N/A 06/10/2020   Procedure: IRRIGATION AND DEBRIDEMENT ABSCESS;  Surgeon: Dwan Bolt, MD;  Location: [place];  Service: General;  Laterality: N/A;  . LAPAROSCOPIC GASTRIC SLEEVE RESECTION      Assessment & Plan Clinical Impression: Patient is a 50 y.o. year old male with recent admission to the hospital on 06/10/20 with fever, chills, leucocytosis and AKI from septic shock due to necrotizing soft tissue infection of perianal, gluteal and perineal tissues. He was taken to OR for excisional debridement the same day by Dr. Michaelle Birks. Later that day he developed A fib with RVR/VT arrest s/p CPR.  He was taken back to OR the next day due to further necrosis requiring debridement and wound cultures showed group B streptococcus actinomyces, E coli and abundant peptostreptococcus species.   He was treated with broad spectrum antibiotics but continued to  have fevers up to 105 and Dr. Tommy Medal recommended consolidating antibiotics to [facility] for IV antibiotics X  Weeks and has been transitioned to Augmentin X 6 months.     He required tracheostomy on 01/26, has been weaned to ATC and tolerating regular diet. He self decannulated accidentally on 02/24 and respiratory status stable. Surgery recommended wet to dry dressing with  hydrotherapy which has helped to clean up wound bed. [facility] has deferred to [facility] for wound care and patient to continue wound local measures with dressing change for management of moderate yellow exudate with wet to dry dressing and flexiseal for bowel management. Nephrology consulted for input on volume overload with hypernatremia and AKI --> felt to be due to AKI/ATN with osmotic diuresis and has resolved with free water and resolution of polyuria.    On coumadin now with amiodarone/cardizem for management of A fib. Deep tissue injury bilateral heels treated with betadine. OIC with fecal impaction managed with SMOG enema/mag citrate on 03/06 and bowel program has been augmented. He continues to require IV dilaudid for mobility--being weaned down to 0.5-0.75 mg tid prn.   Damp to dry dressing changes ongoing to sacrum and rectal tube has been out for past week. Foley remains in place. Blood sugars better controlled on levemir. Therapy has been ongoing with patient showing ability to WB on BLE and stand for 30-45 seconds X 3 with 2-3 person assist. CIR recommended due to functional decline. He complains of pain at sacral pressure injury and shows pictures of its healing process.  Patient transferred to  CIR on 08/06/2020 .    Patient currently requires total with basic self-care skills secondary to muscle weakness, decreased cardiorespiratoy endurance and decreased sitting balance, decreased standing balance, decreased postural control and decreased balance strategies.  Prior to hospitalization, patient could complete BADL with  modified independent .  Patient will benefit from skilled intervention to increase independence with basic self-care skills prior to discharge home with care partner.  Anticipate patient will require 24 hour supervision and minimal physical assistance and follow up home health.  OT - End of Session Endurance Deficit: Yes Endurance Deficit Description: Rest breaks within BADL tasks OT Assessment Rehab Potential (ACUTE ONLY): Good OT Plan OT Intensity: Minimum of 1-2 x/day, 45 to 90 minutes OT Frequency: 5 out of 7 days OT Duration/Estimated Length of Stay: 4 weeks OT Treatment/Interventions: Balance/vestibular training;Community reintegration;Discharge planning;Disease mangement/prevention;DME/adaptive equipment instruction;Functional electrical stimulation;Functional mobility training;Neuromuscular re-education;Patient/family education;Pain management;Self Care/advanced ADL retraining;Psychosocial support;Splinting/orthotics;Skin care/wound managment;Therapeutic Activities;Therapeutic Exercise;UE/LE Coordination activities;Visual/perceptual remediation/compensation;Wheelchair propulsion/positioning;UE/LE Strength taining/ROM OT Recommendation Patient destination: Home Follow Up Recommendations: Home health OT Equipment Recommended: To be determined   OT Evaluation Precautions/Restrictions  Precautions Precautions: Fall Pain Pain Assessment Pain Scale: 0-10 Pain Score: 6  Pain Type: Surgical pain Pain Location: Buttocks Pain Orientation: Left;Right;Medial Pain Descriptors / Indicators: Aching;Discomfort;Throbbing Pain Frequency: Constant 2nd Pain Site Pain Score: 10 Pain Type: Acute pain Pain Location: Back Pain Orientation: Lower Pain Descriptors / Indicators: Spasm Pain Frequency: ConstantPt reports 9/10 pain to buttocks. Rest and repositioned for comfort Home Living/Prior [place] expects to be discharged to:: Private residence Living  Arrangements: Parent Available Help at Discharge: Family,Available 24 hours/day Type of Home: House Home Access: Stairs to enter [employer] of Steps: 6 Entrance Stairs-Rails: Can reach both Home Layout: Able to live on main level with bedroom/bathroom Bathroom Shower/Tub: Chiropodist: Handicapped height Bathroom Accessibility: Yes Additional Comments: Had a shower stool, hand held shower  Lives With: Family (lives with mother) IADL History Current License: Yes Leisure and Hobbies: Geneticist, molecular, builds Advertising account executive Prior Function Level of Independence: Independent with basic ADLs,Independent with homemaking with ambulation Company secretary homemaking responsibilities with mother) Driving: Yes Comments: unemployed, 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 Vision Patient Visual Report: Blurring of vision (Pt reports blurring of vision since being in the hospital) Perception  Perception: Within Functional Limits Praxis Praxis: Intact Cognition Overall Cognitive Status: Within Functional Limits for tasks assessed Arousal/Alertness: Awake/alert Orientation Level: Person;Situation;Place Person: Oriented Place: Oriented Situation: Oriented Year: 2022 Month: March Day of Week: Correct Memory: Appears intact Immediate Memory Recall: Sock;Bed;Blue Memory Recall Sock: Without Cue Memory Recall Blue: Without Cue Memory Recall Bed: Without Cue Safety/Judgment: Appears intact Sensation Sensation Light Touch: Impaired Detail Additional Comments: neuropothy in B hands Coordination Gross Motor Movements are Fluid and Coordinated: No Fine Motor Movements are Fluid and Coordinated: No Coordination and Movement Description: decrease smoothness and accuracy with both hands 2/2 weakness Motor  Motor Motor - Skilled Clinical Observations: Generalized weakness  Trunk/Postural Assessment     Balance Balance Balance Assessed: Yes Static Sitting Balance Static Sitting - Balance  Support: Feet supported Static Sitting - Level of Assistance: 4: Min assist Dynamic Sitting Balance Dynamic Sitting - Balance Support: Feet supported;During functional activity Dynamic Sitting - Level of Assistance: 4: Min assist Extremity/Trunk Assessment RUE Assessment RUE Assessment: Exceptions to [facility] Strength Comments: generalized weakness 3+/5 LUE Assessment LUE Assessment: Exceptions to [facility] General Strength Comments: generalized weakness 3+/5  Care Tool Care Tool Self Care Eating   Eating Assist Level:  Set up assist    Oral Care    Oral Care Assist Level: Minimal Assistance - Patient > 75%    Bathing   Body parts bathed by patient: Chest;Abdomen;Left arm;Right arm;Face Body parts bathed by helper: Left lower leg;Right lower leg;Left upper leg;Right upper leg;Buttocks;Front perineal area   Assist Level: 2 Helpers    Upper Body Dressing(including orthotics)   What is the patient wearing?: [place] only   Assist Level: Maximal Assistance - Patient 25 - 49%    Lower Body Dressing (excluding footwear)   What is the patient wearing?: Hospital gown only Assist for lower body dressing: Total Assistance - Patient < 25%    Putting on/Taking off footwear   What is the patient wearing?: Non-skid slipper socks Assist for footwear: Dependent - Patient 0%       Care Tool Toileting Toileting activity   Assist for toileting: 2 Helpers     Care Tool Bed Mobility Roll left and right activity   Roll left and right assist level: Moderate Assistance - Patient 50 - 74%    Sit to lying activity   Sit to lying assist level: Moderate Assistance - Patient 50 - 74%    Lying to sitting edge of bed activity   Lying to sitting edge of bed assist level: Maximal Assistance - Patient 25 - 49%     Care Tool Transfers Sit to stand transfer Sit to stand activity did not occur: Safety/medical concerns      Chair/bed transfer Chair/bed transfer activity did not occur:  Safety/medical concerns       Toilet transfer Toilet transfer activity did not occur: Safety/medical concerns       Care Tool Cognition Expression of Ideas and Wants Expression of Ideas and Wants: Without difficulty (complex and basic) - expresses complex messages without difficulty and with speech that is clear and easy to understand   Understanding Verbal and Non-Verbal Content Understanding Verbal and Non-Verbal Content: Understands (complex and basic) - clear comprehension without cues or repetitions   Memory/Recall Ability *first 3 days only Memory/Recall Ability *first 3 days only: That he or she is in a hospital/hospital unit;Staff names and faces;Location of own room;Current season    Refer to Care Plan for Long Term Goals  SHORT TERM GOAL WEEK 1 OT Short Term Goal 1 (Week 1): Patient will tolerate sitting EOB for 15 minutes in preparation for BADL task OT Short Term Goal 2 (Week 1): Patient will complete sit<>stand with max A +2 in prep for transfer OT Short Term Goal 3 (Week 1): Patient will complete 50% of U Bbathing task while seated EOB.  Recommendations for other services: None    Skilled Therapeutic Intervention Pt greeted semi-reclined in bed and agreeable to participate in OT. OT eval completed addressing rehab process, OT purpose, POC, ELOS, and goals. Pt agreeable to come to sitting EOB, requring min A, but heavy use of bed functions, railings, and HOB elevated to get there. Pt tolerated sitting for 10 minutes while participating in UB bathing, mod A, and max A to don gown. Total A for donning socks and no pants available. Pt with wound on buttocks so pants also deferred for that reason at this time. Pt with some tremors likely related to weakness while transferring back to bed. Pt reported increased pain in buttocks while sitting EOB. Pt returned to bed with max A and nursing entered to change wound dressing. Pt left in care of nursing staff.  ADL ADL Eating: Set  up Grooming: Minimal assistance Upper Body Bathing: Moderate assistance Lower Body Bathing: Dependent Upper Body Dressing: Maximal assistance Lower Body Dressing: Dependent Toileting: Dependent Toilet Transfer: Unable to assess Gaffer Transfer: Unable to assess Mobility      Discharge Criteria: Patient will be discharged from OT if patient refuses treatment 3 consecutive times without medical reason, if treatment goals not met, if there is a change in medical status, if patient makes no progress towards goals or if patient is discharged from hospital.  The above assessment, treatment plan, treatment alternatives and goals were discussed and mutually agreed upon: by patient  Valma Cava 08/06/2020, 12:58 PM

## 2020-08-06 NOTE — Progress Notes (Signed)
Inpatient Rehabilitation  Patient information reviewed and entered into eRehab system by Melissa M. Bowie, M.A., CCC/SLP, PPS Coordinator.  Information including medical coding, functional ability and quality indicators will be reviewed and updated through discharge.    

## 2020-08-06 NOTE — Plan of Care (Signed)
  Problem: RH Balance Goal: LTG: Patient will maintain dynamic sitting balance (OT) Description: LTG:  Patient will maintain dynamic sitting balance with assistance during activities of daily living (OT) Flowsheets (Taken 08/06/2020 1244) LTG: Pt will maintain dynamic sitting balance during ADLs with: Independent Goal: LTG Patient will maintain dynamic standing with ADLs (OT) Description: LTG:  Patient will maintain dynamic standing balance with assist during activities of daily living (OT)  Flowsheets (Taken 08/06/2020 1244) LTG: Pt will maintain dynamic standing balance during ADLs with: Contact Guard/Touching assist   Problem: Sit to Stand Goal: LTG:  Patient will perform sit to stand in prep for activites of daily living with assistance level (OT) Description: LTG:  Patient will perform sit to stand in prep for activites of daily living with assistance level (OT) Flowsheets (Taken 08/06/2020 1244) LTG: PT will perform sit to stand in prep for activites of daily living with assistance level: Contact Guard/Touching assist   Problem: RH Eating Goal: LTG Patient will perform eating w/assist, cues/equip (OT) Description: LTG: Patient will perform eating with assist, with/without cues using equipment (OT) Flowsheets (Taken 08/06/2020 1244) LTG: Pt will perform eating with assistance level of: Independent with assistive device    Problem: RH Grooming Goal: LTG Patient will perform grooming w/assist,cues/equip (OT) Description: LTG: Patient will perform grooming with assist, with/without cues using equipment (OT) Flowsheets (Taken 08/06/2020 1244) LTG: Pt will perform grooming with assistance level of: Independent with assistive device    Problem: RH Bathing Goal: LTG Patient will bathe all body parts with assist levels (OT) Description: LTG: Patient will bathe all body parts with assist levels (OT) Flowsheets (Taken 08/06/2020 1244) LTG: Pt will perform bathing with assistance level/cueing: Minimal  Assistance - Patient > 75%   Problem: RH Dressing Goal: LTG Patient will perform upper body dressing (OT) Description: LTG Patient will perform upper body dressing with assist, with/without cues (OT). Flowsheets (Taken 08/06/2020 1244) LTG: Pt will perform upper body dressing with assistance level of: Supervision/Verbal cueing Goal: LTG Patient will perform lower body dressing w/assist (OT) Description: LTG: Patient will perform lower body dressing with assist, with/without cues in positioning using equipment (OT) Flowsheets (Taken 08/06/2020 1244) LTG: Pt will perform lower body dressing with assistance level of: Minimal Assistance - Patient > 75%   Problem: RH Toileting Goal: LTG Patient will perform toileting task (3/3 steps) with assistance level (OT) Description: LTG: Patient will perform toileting task (3/3 steps) with assistance level (OT)  Flowsheets (Taken 08/06/2020 1244) LTG: Pt will perform toileting task (3/3 steps) with assistance level: Minimal Assistance - Patient > 75%   Problem: RH Functional Use of Upper Extremity Goal: LTG Patient will use RT/LT upper extremity as a (OT) Description: LTG: Patient will use right/left upper extremity as a stabilizer/gross assist/diminished/nondominant/dominant level with assist, with/without cues during functional activity (OT) Flowsheets (Taken 08/06/2020 1244) LTG: Use of upper extremity in functional activities:  RUE as dominant level  LUE as nondominant level   Problem: RH Toilet Transfers Goal: LTG Patient will perform toilet transfers w/assist (OT) Description: LTG: Patient will perform toilet transfers with assist, with/without cues using equipment (OT) Flowsheets (Taken 08/06/2020 1244) LTG: Pt will perform toilet transfers with assistance level of: Contact Guard/Touching assist

## 2020-08-06 NOTE — Progress Notes (Signed)
PROGRESS NOTE   Subjective/Complaints:   Pt reports finger bleeding and won't stop- due to coumadin- making blood "thin". Needs bandaid. Was placed.   Still has foley- Also LBM in last 24 hours, but is incontinent of stool- even at home, needed mother to wipe him after stool- Said had bowel obstruction so couldn't con't with flexiseal, but wishes could be put back in. Explained it cannot.  Also c/o neuropathy- worse in hands in last few days- makes it more difficult to hold fork/spoon. More numb.   Nerve pain is also worse lately and "needs" IV pain meds for dressing changes- explained cannot use for therapy- only for dressing changes.   Did note stools loose- will decrease Colace.   ROS:  Pt denies SOB, abd pain, CP, N/V/C/D, and vision changes  Objective:   No results found. No results for input(s): WBC, HGB, HCT, PLT in the last 72 hours. Recent Labs    08/04/20 0039 08/05/20 0515  NA 135 133*  K 3.2* 3.6  CL 96* 93*  CO2 27 29  GLUCOSE 146* 132*  BUN 6 9  CREATININE 0.79 1.04  CALCIUM 8.3* 8.3*    Intake/Output Summary (Last 24 hours) at 08/06/2020 1609 Last data filed at 08/06/2020 1343 Gross per 24 hour  Intake 476 ml  Output 1950 ml  Net -1474 ml     Pressure Injury 08/06/20 Heel Right Stage 2 -  Partial thickness loss of dermis presenting as a shallow open injury with a red, pink wound bed without slough. (Active)  08/06/20 0240  Location: Heel  Location Orientation: Right  Staging: Stage 2 -  Partial thickness loss of dermis presenting as a shallow open injury with a red, pink wound bed without slough.  Wound Description (Comments):   Present on Admission: Yes    Physical Exam: Vital Signs Blood pressure 113/65, pulse 96, temperature 97.9 F (36.6 C), resp. rate 17, height 5\' 10"  (1.778 m), weight (!) 246.3 kg, SpO2 95 %.  Physical Exam  General: awake, alert, appropriate, BMI of 78- in  bariatric bed, older appearing than stated age, OT in room, NAD HENT: conjugate gaze; oropharynx moist CV: regular rate and rhythm; no JVD Pulmonary: CTA B/L; no W/R/R- good air movement overall, but slightly decreased at bases B/L GI: soft, NT, ND, (+)BS- protuberant Psychiatric: appropriate; but focused on pain meds and pain Skin: Sacrum wound open Finger on L hand bleeding- had 3 tissues as bedside covered in little bleeding spots- so had occurred for at least 15 minutes.   Neuro: Alert and oriented x3. Good historian. 5/5 strength in bilateral upper extremities, 4/5 in lower extremities. Decreased sensation in bilateral hands and feet.     Assessment/Plan: 1. Functional deficits which require 3+ hours per day of interdisciplinary therapy in a comprehensive inpatient rehab setting.  Physiatrist is providing close team supervision and 24 hour management of active medical problems listed below.  Physiatrist and rehab team continue to assess barriers to discharge/monitor patient progress toward functional and medical goals  Care Tool:  Bathing    Body parts bathed by patient: Chest,Abdomen,Left arm,Right arm,Face   Body parts bathed by helper: Left lower leg,Right lower  leg,Left upper leg,Right upper leg,Buttocks,Front perineal area     Bathing assist Assist Level: 2 Helpers     Upper Body Dressing/Undressing Upper body dressing   What is the patient wearing?: Hospital gown only    Upper body assist Assist Level: Maximal Assistance - Patient 25 - 49%    Lower Body Dressing/Undressing Lower body dressing      What is the patient wearing?: Hospital gown only     Lower body assist Assist for lower body dressing: Total Assistance - Patient < 25%     Toileting Toileting    Toileting assist Assist for toileting: 2 Helpers     Transfers Chair/bed transfer  Transfers assist  Chair/bed transfer activity did not occur: Safety/medical concerns         Locomotion Ambulation   Ambulation assist   Ambulation activity did not occur: Safety/medical concerns          Walk 10 feet activity   Assist  Walk 10 feet activity did not occur: Safety/medical concerns        Walk 50 feet activity   Assist Walk 50 feet with 2 turns activity did not occur: Safety/medical concerns         Walk 150 feet activity   Assist Walk 150 feet activity did not occur: Safety/medical concerns         Walk 10 feet on uneven surface  activity   Assist Walk 10 feet on uneven surfaces activity did not occur: Safety/medical concerns         Wheelchair     Assist Will patient use wheelchair at discharge?: Yes Type of Wheelchair: Manual Wheelchair activity did not occur: Safety/medical concerns         Wheelchair 50 feet with 2 turns activity    Assist    Wheelchair 50 feet with 2 turns activity did not occur: Safety/medical concerns       Wheelchair 150 feet activity     Assist  Wheelchair 150 feet activity did not occur: Safety/medical concerns       Blood pressure 113/65, pulse 96, temperature 97.9 F (36.6 C), resp. rate 17, height 5\' 10"  (1.778 m), weight (!) 246.3 kg, SpO2 95 %.  Medical Problem List and Plan: 1.  Debility secondary to septic shock from necrotizing soft tissue infection of perianal, gluteal, and perineal tissues.              -patient may shower but wound must be covered             -ELOS/Goals: modA (to be provided by father) in 2-3 weeks.   3/8- set d/c date at 4/5 at latest as d/c date at team conference today-  2.  Antithrombotics: -DVT/anticoagulation:  Pharmaceutical: Coumadin--was on Xarelto 20 mg daily-->Resume as AKI has resolved?  3/8- AKI has resolved, so restarted Xarelto and stopped Coumadin- esp since finger was bleeding so continuous today.              -antiplatelet therapy: N/A 3. Pain Management:  Continue Lyrica and elavil for neuropathy.              --  Decrease IV Dilaudid to 0.5mg  BID PRN with dressing changes             --Oxycontin 15 mg bid with 10 mg prn for breakthrough pain.   3/8- will make sure has Dilaudid for dressing changes, and see them done Thursday- not sure needs the IV pain meds, but will check.Since also  c/o muscle spasms, will add Robaxin 500 mg QID prn for muscle spasms.   4. Mood: LCSW to follow for evaluation and support.              -antipsychotic agents: N/A 5. Neuropsych: This patient is capable of making decisions on his own behalf. 6. Skin/Wound Care: Air mattress for pressure relief measures.              --will need local care frequently to keep sacral wound bed clean.             --Continue foley for now.  3/8- cannot have flexiseal since in Rehab- will d/w nursing  7. Fluids/Electrolytes/Nutrition: Monitor I/O. Check CMET in am.  8. Necrotizing fascitis: Has been on augment since 02/11--> to continue for 6 months per ID 9. T2DM with neuropathy: Hgb A1c- 9.6 (down from 13 in Aug) Will continue to monitor BS ac/hs and use SSI for elevated BS             --used Farxiga, metformin and Victoza 1.8.             --Continue Levemir bid and titrate as indicated.  10 Hyponatremia: Likely due to multiple BM yesterday.              --Will recheck in am.  11. Hypokalemia:  Repeat K+ tomorrow.   3/8- K+ 3.6- doing better- con't regimen 12. Proximal A FIb: Monitor HR tid--continue Cardizem 240 mg and amiodarone for rate control.              --monitor for symptoms with increase in activity.  13. OIC: Finally had results yesterday--3 big BM             --Will discontinue colace to help bulk stools as now having loose stools.  3/8- decreased colace today- it wasn't stopped, to bulk up stools.   Decreased to daily, but might need to stop- con't miralax, due to being on chronic pain meds. 14. Chronic right heel ulcer: Continue to paint wound daily with iodine.  15. Morbid obesity, BMI 78.51: provide counseling. Will need  bariatric equipment.  16. Disposition: at baseline, patient received significant assistance from his mother, aged 49. He required her assistance to wipe stool after BM due to body habitus. He was able to ambulate to bathroom and back on his own. Plan is for discharge home to father, who is a IT sales professional, and who can provide more physical support to patient. Goal is ModA level for ambulation within his room. Discussed with patient plan for approx 2-3 week stay to reach this goal. He is currently demonstrating improved standing tolerance. Fuller Song discussed with father.    LOS: 0 days A FACE TO FACE EVALUATION WAS PERFORMED  Megan Lovorn 08/06/2020, 4:09 PM

## 2020-08-06 NOTE — Progress Notes (Signed)
Inpatient Rehabilitation Medication Review by a Pharmacist  A complete drug regimen review was completed for this patient to identify any potential clinically significant medication issues.  Clinically significant medication issues were identified:  no  Check AMION for pharmacist assigned to patient if future medication questions/issues arise during this admission.  Pharmacist comments:   Time spent performing this drug regimen review (minutes):  10   Usbaldo Pannone, Pharm.D., BCPS Clinical Pharmacist  08/06/2020 9:21 AM

## 2020-08-06 NOTE — Patient Care Conference (Signed)
Inpatient RehabilitationTeam Conference and Plan of Care Update Date: 08/06/2020   Time: 11:12 AM    Patient Name: Carl Hill      Medical Record Number: [id number]  Date of Birth: 1971/02/01 Sex: Male         Room/Bed: 4W14C/4W14C-01 Payor Info: Payor: MEDICAID [place] / Plan: MEDICAID [place] ACCESS / Product Type: *No Product type* /    Admit Date/Time:  08/06/2020  1:01 AM  Primary Diagnosis:  Debility  Hospital Problems: Principal Problem:   Debility    Expected Discharge Date: Expected Discharge Date: 09/03/20  Team Members Present: Physician leading conference: Dr. Genice Rouge Care Coodinator Present: Cecile Sheerer, LCSWA;Tabor Bartram Marlyne Beards, RN, BSN, CRRN Nurse Present: Margot Ables, LPN PT Present: Peter Congo, PT OT Present: Ardis Rowan, COTA;Jennifer Katrinka Blazing, OT PPS Coordinator present : Fae Pippin, SLP     Current Status/Progress Goal Weekly Team Focus  Bowel/Bladder   Foley, Continent with incontinent episodes of bowel. LBM 08/06/2020  Regain continence of bowel, Void trial for foley  Assess Qshift and PRN   Swallow/Nutrition/ Hydration             ADL's             Mobility   PT Eval pending  PT Eval pending  PT Eval pending   Communication             Safety/Cognition/ Behavioral Observations            Pain   C/O 8/10 pain on buttocks  Pain <3/10  Assess Qshift and PRN   Skin   Stage 2 ulcer on right heel, Exoriation on right hip and abdomen, Wound on buttocks.  Promote healing and prevention of infection  Assess Qshift and PRN     Discharge Planning:  To be assessed. Per EMR, pt to d/c to mother's home who will provide 24/7 care.   Team Discussion: BMI of 78, on Augmentin x 6 weeks, IV dilaudid for dressing changes only. Goal is to go home with father at [facility] assist. Patient self-decannulated on prior unit. Dressing changed today with complaints of 10/10 pain, foley in place. Patient requested rectal tube be placed again, MD stated NO.  Patient is incontinent bowel. Patient on target to meet rehab goals: Contact guard to close supervision at EOB only. Bathing and dressing at bed level. Max assist for upper body, total assist for lower bowel. Pain is a major barrier at this time.  *See Care Plan and progress notes for long and short-term goals.   Revisions to Treatment Plan:  Not at this time.  Teaching Needs: Family education, medication management, pain management, bowel/bladder management, skin/wound care, transfer training, gait training, balance training, endurance training.  Current Barriers to Discharge: Inaccessible home environment, Decreased caregiver support, Home enviroment access/layout, Incontinence, Wound care, Lack of/limited family support, Weight, Weight bearing restrictions, Medication compliance, Behavior and pain management.  Possible Resolutions to Barriers: Continue current medications, provide emotional support.     Medical Summary Current Status: on coumadin; 10/10 pain; incontinent of bowel; has foley; BMI of 78; on bariatric bed; having spasms of back today- c/o nerve pain shooting pain.  Barriers to Discharge: Decreased family/caregiver support;Behavior;Home enviroment access/layout;Incontinence;Medical stability;Medication compliance;Weight;Wound care  Barriers to Discharge Comments: stage II on LE; has dilaudid IV for dressing changes only- pain and weight biggest limiters Possible Resolutions to [employer] Focus: trach out- healing;  added dliaudid IV prn;    d/c date 4/5- reduce bowel meds; look at pain meds.  Continued Need for Acute Rehabilitation Level of Care: The patient requires daily medical management by a physician with specialized training in physical medicine and rehabilitation for the following reasons: Direction of a multidisciplinary physical rehabilitation program to maximize functional independence : Yes Medical management of patient stability for increased activity  during participation in an intensive rehabilitation regime.: Yes Analysis of laboratory values and/or radiology reports with any subsequent need for medication adjustment and/or medical intervention. : Yes   I attest that I was present, lead the team conference, and concur with the assessment and plan of the team.   Tennis Must 08/06/2020, 4:18 PM

## 2020-08-06 NOTE — Progress Notes (Incomplete)
Patient ID: Carl Hill, male   DOB: 1970/09/09, 50 y.o.   MRN: [id number]

## 2020-08-06 NOTE — Evaluation (Signed)
Physical Therapy Assessment and Plan  Patient Details  Name: Carl Hill MRN: [id number] Date of Birth: 12/24/1970  PT Diagnosis: Abnormal posture, Abnormality of gait, Difficulty walking, Dizziness and giddiness, Impaired sensation, Low back pain, Muscle spasms, Muscle weakness and Pain in buttocks, back  Rehab Potential: Fair ELOS: 28-30 days   Today's Date: 08/06/2020 PT Individual Time:0900-1002 PT time calculation: 62 min      Hospital Problem: Principal Problem:   Debility   Past Medical History:  Past Medical History:  Diagnosis Date  . 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 Asthma   . Atrial fibrillation ([place])   . Diabetes mellitus without complication ([place])   . History of cardioversion    3 times   . Hypertension   . Sepsis due to Streptococcus, group B ([place]) 06/22/2020   Past Surgical History:  Past Surgical History:  Procedure Laterality Date  . ABDOMINAL SURGERY    . INCISION AND DRAINAGE PERIRECTAL ABSCESS N/A 06/11/2020   Procedure: IRRIGATION AND DEBRIDEMENT PERINEAL WOUND;  Surgeon: Dwan Bolt, MD;  Location: [place];  Service: General;  Laterality: N/A;  . IRRIGATION AND DEBRIDEMENT ABSCESS N/A 06/10/2020   Procedure: IRRIGATION AND DEBRIDEMENT ABSCESS;  Surgeon: Dwan Bolt, MD;  Location: [place];  Service: General;  Laterality: N/A;  . LAPAROSCOPIC GASTRIC SLEEVE RESECTION      Assessment & Plan Clinical Impression: Carl Hill is a 50 year old male with history of T2DM, PAF (Dr. Shirlee More), chronic right heel ulcer (Dr. Rosita Fire), supermorbid obesity--BMI 78 who was admitted on 06/10/20 with fever, chills, leucocytosis and AKI from septic shock due to necrotizing soft tissue infection of perianal, gluteal and perineal tissues. He was taken to OR for excisional debridement the same day by Dr. Michaelle Birks. Later that day he developed A fib with RVR/VT arrest s/p CPR.  He was taken back to OR the next day due to further necrosis requiring debridement and  wound cultures showed group B streptococcus actinomyces, E coli and abundant peptostreptococcus species.   He was treated with broad spectrum antibiotics but continued to have fevers up to 105 and Dr. Tommy Medal recommended consolidating antibiotics to [facility] for IV antibiotics X  Weeks and has been transitioned to Augmentin X 6 months.    He required tracheostomy on 01/26, has been weaned to ATC and tolerating regular diet. He self decannulated accidentally on 02/24 and respiratory status stable. Surgery recommended wet to dry dressing with  hydrotherapy which has helped to clean up wound bed. [facility] has deferred to [facility] for wound care and patient to continue wound local measures with dressing change for management of moderate yellow exudate with wet to dry dressing and flexiseal for bowel management. Nephrology consulted for input on volume overload with hypernatremia and AKI --> felt to be due to AKI/ATN with osmotic diuresis and has resolved with free water and resolution of polyuria.   On coumadin now with amiodarone/cardizem for management of A fib. Deep tissue injury bilateral heels treated with betadine. OIC with fecal impaction managed with SMOG enema/mag citrate on 03/06 and bowel program has been augmented. He continues to require IV dilaudid for mobility--being weaned down to 0.5-0.75 mg tid prn.   Damp to dry dressing changes ongoing to sacrum and rectal tube has been out for past week. Foley remains in place. Blood sugars better controlled on levemir. Therapy has been ongoing with patient showing ability to WB on BLE and stand for 30-45 seconds X 3 with 2-3 person assist. He  complains of pain at sacral pressure injury and shows pictures of its healing process Patient transferred to CIR on 08/06/2020 .   Patient currently requires total with mobility secondary to muscle weakness, decreased cardiorespiratoy endurance, unbalanced muscle activation and decreased coordination and decreased  postural control.  Prior to hospitalization, patient was modified independent  with mobility and lived with Family in a House home.  Home access is  (6)Stairs to enter.  Patient will benefit from skilled PT intervention to maximize safe functional mobility, minimize fall risk and decrease caregiver burden for planned discharge home with 24 hour supervision.  Anticipate patient will benefit from follow up [place] at discharge.  PT - End of Session Activity Tolerance: Tolerates < 10 min activity, no significant change in vital signs Endurance Deficit: Yes Endurance Deficit Description: Requires rest with activity PT Assessment Rehab Potential (ACUTE/IP ONLY): Fair PT Barriers to Discharge: Decreased caregiver support;Inaccessible home environment;Lack of/limited family support;Weight PT Plan PT Intensity: Minimum of 1-2 x/day ,45 to 90 minutes PT Frequency: 5 out of 7 days PT Duration Estimated Length of Stay: 28-30 days PT Treatment/Interventions: Ambulation/gait training;Community reintegration;Discharge planning;Disease management/prevention;DME/adaptive equipment instruction;Functional mobility training;Neuromuscular re-education;Pain management;Patient/family education;Psychosocial support;Skin care/wound management;Therapeutic Activities;Therapeutic Exercise;Stair training;UE/LE Coordination activities;UE/LE Strength taining/ROM;Balance/vestibular training PT Recommendation Recommendations for Other Services: Therapeutic Recreation consult;Neuropsych consult Therapeutic Recreation Interventions: Stress management Follow Up Recommendations: Home health PT Patient destination: Home Equipment Recommended: To be determined   PT Evaluation Precautions/Restrictions Precautions Precautions: Fall Precaution Comments: foley catheter, large buttocks wound, R heel wound General   Vital SignsTherapy Vitals Temp: 97.9 F (36.6 C) Pulse Rate: 96 Resp: 17 BP: 113/65 Patient Position (if  appropriate): Lying Oxygen Therapy SpO2: 95 % O2 Device: Room Air Pain   Home Living/Prior Functioning Home Living Available Help at Discharge: Family;Available 24 hours/day Type of Home: House Home Access: Stairs to enter [employer] of Steps:  (6) Entrance Stairs-Rails: Can reach both Home Layout: One level  Lives With: Family Prior Function Level of Independence: Requires assistive device for independence;Independent with transfers;Independent with gait  Able to Take Stairs?: Yes Driving: Yes Vocation: Retired Comments: unemployed, 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 Vision/Perception  Geologist, engineering: Within Functional Limits Praxis Praxis: Intact  Cognition Overall Cognitive Status: Within Functional Limits for tasks assessed Arousal/Alertness: Awake/alert Attention: Focused;Sustained Focused Attention: Appears intact Sustained Attention: Appears intact Memory: Appears intact Awareness: Appears intact Problem Solving: Appears intact Safety/Judgment: Appears intact Sensation Sensation Light Touch: Impaired Detail Light Touch Impaired Details: Impaired RLE;Impaired LLE Additional Comments: 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 Coordination Gross Motor Movements are Fluid and Coordinated: No Fine Motor Movements are Fluid and Coordinated: No Coordination and Movement Description: decrease smoothness and accuracy with both hands 2/2 weakness Motor  Motor Motor: Abnormal postural alignment and control;Abnormal tone Motor - Skilled Clinical Observations: Generalized weakness   Trunk/Postural Assessment  Cervical Assessment Cervical Assessment: Within Functional Limits Thoracic Assessment Thoracic Assessment: Within Functional Limits Lumbar Assessment Lumbar Assessment: Within Functional Limits Postural Control Postural Control: Deficits on evaluation Postural Limitations: limited due to lower back pain and spasms  Balance Balance Balance Assessed:  Yes Static Sitting Balance Static Sitting - Balance Support: Feet supported Static Sitting - Level of Assistance: 5: Stand by assistance Extremity Assessment      RLE Assessment RLE Assessment: Exceptions to [facility] General Strength Comments: Generalized weakness LLE Assessment LLE Assessment: Within Functional Limits General Strength Comments: Generalized weakness  Care Tool Care Tool Bed Mobility Roll left and right activity   Roll left  and right assist level: Supervision/Verbal cueing    Sit to lying activity   Sit to lying assist level: Supervision/Verbal cueing    Lying to sitting edge of bed activity   Lying to sitting edge of bed assist level: Contact Guard/Touching assist     Care Tool Transfers Sit to stand transfer Sit to stand activity did not occur: Safety/medical concerns      Chair/bed transfer Chair/bed transfer activity did not occur: Safety/medical concerns       Toilet transfer Toilet transfer activity did not occur: Safety/medical concerns      Scientist, product/process development transfer activity did not occur: Safety/medical concerns        Care Tool Locomotion Ambulation Ambulation activity did not occur: Safety/medical concerns        Walk 10 feet activity Walk 10 feet activity did not occur: Safety/medical concerns       Walk 50 feet with 2 turns activity Walk 50 feet with 2 turns activity did not occur: Safety/medical concerns      Walk 150 feet activity Walk 150 feet activity did not occur: Safety/medical concerns      Walk 10 feet on uneven surfaces activity Walk 10 feet on uneven surfaces activity did not occur: Safety/medical concerns      Stairs Stair activity did not occur: Safety/medical concerns        Walk up/down 1 step activity Walk up/down 1 step or curb (drop down) activity did not occur: Safety/medical concerns     Walk up/down 4 steps activity did not occuR: Safety/medical concerns  Walk up/down 4 steps activity      Walk up/down 12  steps activity Walk up/down 12 steps activity did not occur: Safety/medical concerns      Pick up small objects from floor Pick up small object from the floor (from standing position) activity did not occur: Safety/medical concerns      Wheelchair Will patient use wheelchair at discharge?: Yes Type of Wheelchair: Manual Wheelchair activity did not occur: Safety/medical concerns      Wheel 50 feet with 2 turns activity Wheelchair 50 feet with 2 turns activity did not occur: Safety/medical concerns    Wheel 150 feet activity Wheelchair 150 feet activity did not occur: Safety/medical concerns      Refer to Care Plan for Long Term Goals  SHORT TERM GOAL WEEK 1 PT Short Term Goal 1 (Week 1): Pt will tolerate sitting EOB with supervision x 10 minutes while completing a functional task. PT Short Term Goal 2 (Week 1): Pt will perform STS with min A x 2 consistently PT Short Term Goal 3 (Week 1): Pt will initiate gait training.  Recommendations for other services: Neuropsych and Therapeutic Recreation  Stress management  Skilled Therapeutic Intervention  Evaluation completed (see details above and below) with education on PT POC and goals and individual treatment initiated with focus on increasing sitting tolerance and functional mobility. Pt received in L sidelying and agreeable to evaluation. Session focussed on subjective evaluation and assessing functional mobility. Pt required supervision for bed mobility, including rolling and sitting to EOB. Pt reported dizziness in sitting that did not improve after several minutes and 10/10 pain in his low back and buttocks with spasms/extremity shaking that continued after returning to sidelying. Did not attempt STS due to pain and dizziness. Pt demonstrates neuropathic numbness below the knee and reports similar numbness in his hands. Pt demonstrated bed level ROM, but could not perform manual muscle testing due to pain in  sitting. Pt remained in  sidelying with all needs in reach after session.   Mobility Bed Mobility Bed Mobility: Rolling Right;Rolling Left;Supine to Sit;Sit to Supine Rolling Right: Supervision/verbal cueing Rolling Left: Supervision/Verbal cueing Supine to Sit: Supervision/Verbal cueing Sit to Supine: Supervision/Verbal cueing Locomotion  Stairs / Additional Locomotion Stairs: No Wheelchair Mobility Wheelchair Mobility: No   Discharge Criteria: Patient will be discharged from PT if patient refuses treatment 3 consecutive times without medical reason, if treatment goals not met, if there is a change in medical status, if patient makes no progress towards goals or if patient is discharged from hospital.  The above assessment, treatment plan, treatment alternatives and goals were discussed and mutually agreed upon: by patient  Sharen Counter, SPT 08/06/2020, 5:29 PM

## 2020-08-06 NOTE — Progress Notes (Signed)
Patient arrived on unit to room 4W14. Oriented to floor and room. Call bell within reach.

## 2020-08-06 NOTE — H&P (Addendum)
Physical Medicine and Rehabilitation Admission H&P    Chief Complaint  Patient presents with  . Debility in setting of sepsis due to Fournier's gangrene and super morbid obesity.     HPI: Carl Hill is a 50 year old male with history of T2DM, PAF (Dr. Boneta Lucks), chronic right heel ulcer (Dr. Sharol Harness), supermorbid obesity--BMI 78 who was admitted on 06/10/20 with fever, chills, leucocytosis and AKI from septic shock due to necrotizing soft tissue infection of perianal, gluteal and perineal tissues. He was taken to OR for excisional debridement the same day by Dr. Sophronia Simas. Later that day he developed A fib with RVR/VT arrest s/p CPR.  He was taken back to OR the next day due to further necrosis requiring debridement and wound cultures showed group B streptococcus actinomyces, E coli and abundant peptostreptococcus species.   He was treated with broad spectrum antibiotics but continued to have fevers up to 105 and Dr. Daiva Eves recommended consolidating antibiotics to [facility] for IV antibiotics X  Weeks and has been transitioned to Augmentin X 6 months.    He required tracheostomy on 01/26, has been weaned to ATC and tolerating regular diet. He self decannulated accidentally on 02/24 and respiratory status stable. Surgery recommended wet to dry dressing with  hydrotherapy which has helped to clean up wound bed. CCS has deferred to [facility] for wound care and patient to continue wound local measures with dressing change for management of moderate yellow exudate with wet to dry dressing and flexiseal for bowel management. Nephrology consulted for input on volume overload with hypernatremia and AKI --> felt to be due to AKI/ATN with osmotic diuresis and has resolved with free water and resolution of polyuria.   On coumadin now with amiodarone/cardizem for management of A fib. Deep tissue injury bilateral heels treated with betadine. OIC with fecal impaction managed with SMOG enema/mag  citrate on 03/06 and bowel program has been augmented. He continues to require IV dilaudid for mobility--being weaned down to 0.5-0.75 mg tid prn.   Damp to dry dressing changes ongoing to sacrum and rectal tube has been out for past week. Foley remains in place. Blood sugars better controlled on levemir. Therapy has been ongoing with patient showing ability to WB on BLE and stand for 30-45 seconds X 3 with 2-3 person assist. CIR recommended due to functional decline. He complains of pain at sacral pressure injury and shows pictures of its healing process.    Review of Systems  Constitutional: Negative for chills and fever.  HENT: Negative for hearing loss and tinnitus.   Eyes: Negative for blurred vision and double vision.  Respiratory: Negative for cough and shortness of breath.   Cardiovascular: Negative for chest pain and palpitations.  Gastrointestinal: Positive for diarrhea. Negative for abdominal pain and heartburn.  Genitourinary: Negative for dysuria and urgency.  Musculoskeletal: Negative for myalgias.  Skin: Negative for rash.  Neurological: Positive for weakness. Negative for dizziness and headaches.  Psychiatric/Behavioral: The patient is not nervous/anxious and does not have insomnia.      Past Medical History:  Diagnosis Date  . 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 Asthma   . Atrial fibrillation (HCC)   . Diabetes mellitus without complication (HCC)   . History of cardioversion    3 times   . Hypertension   . Sepsis due to Streptococcus, group B (HCC) 06/22/2020    Past Surgical History:  Procedure Laterality Date  . ABDOMINAL SURGERY    . INCISION AND DRAINAGE PERIRECTAL  ABSCESS N/A 06/11/2020   Procedure: IRRIGATION AND DEBRIDEMENT PERINEAL WOUND;  Surgeon: Fritzi Mandes, MD;  Location: [facility] OR;  Service: General;  Laterality: N/A;  . IRRIGATION AND DEBRIDEMENT ABSCESS N/A 06/10/2020   Procedure: IRRIGATION AND DEBRIDEMENT ABSCESS;  Surgeon: Fritzi Mandes, MD;  Location: [facility]  OR;  Service: General;  Laterality: N/A;  . LAPAROSCOPIC GASTRIC SLEEVE RESECTION      Family History  Problem Relation Age of Onset  . Diabetes Mother   . Healthy Brother   . Breast cancer Paternal Grandmother   . Pancreatic cancer Paternal Uncle      Social History:  Single. Has been out of work since 2019--has moved in with mother 08/2019. He reports that he has been smoking about a pack a week. He has never used smokeless tobacco. He reports previous alcohol use. He reports that he does not use drugs.     Allergies  Allergen Reactions  . Bee Venom Anaphylaxis    Other reaction(s): Unknown Other reaction(s): Unknown Other reaction(s): Unknown   . Sglt2 Inhibitors Other (See Comments)    Necrotizing infection of the perineum  . Other Hives    Medications Prior to Admission  Medication Sig Dispense Refill  . albuterol (PROVENTIL) (2.5 MG/3ML) 0.083% nebulizer solution Take 3 mLs (2.5 mg total) by nebulization every 4 (four) hours as needed for wheezing or shortness of breath. 75 mL 12  . amiodarone (PACERONE) 200 MG tablet Take 1 tablet (200 mg total) by mouth daily.    Marland Kitchen amoxicillin-clavulanate (AUGMENTIN) 875-125 MG tablet Take 1 tablet by mouth every 8 (eight) hours.    Marland Kitchen atorvastatin (LIPITOR) 20 MG tablet Take 20 mg by mouth every evening.    . diltiazem (CARDIZEM CD) 240 MG 24 hr capsule Take 1 capsule (240 mg total) by mouth daily.    Marland Kitchen docusate sodium (COLACE) 100 MG capsule Take 1 capsule (100 mg total) by mouth 2 (two) times daily. 10 capsule 0  . Ensure Max Protein (ENSURE MAX PROTEIN) LIQD Take 330 mLs (11 oz total) by mouth at bedtime.    . feeding supplement (ENSURE ENLIVE / ENSURE PLUS) LIQD Take 237 mLs by mouth 2 (two) times daily between meals. 237 mL 12  . HYDROmorphone (DILAUDID) 1 MG/ML injection Inject 1 mL (1 mg total) into the vein every 4 (four) hours as needed (prior to turns and dressing changes). 1 mL 0  . insulin aspart (NOVOLOG) 100 UNIT/ML  injection Inject 0-20 Units into the skin 3 (three) times daily with meals. Question Answer Comment Correction coverage: Resistant (obese, steroids)  CBG < 70: Implement Hypoglycemia Standing Orders and refer to Hypoglycemia Standing Orders sidebar report  CBG 70 - 120: 0 units  CBG 121 - 150: 3 units  CBG 151 - 200: 4 units  CBG 201 - 250: 7 units  CBG 251 - 300: 11 units  CBG 301 - 350: 15 units  CBG 351 - 400: 20 units  CBG > 400 call MD and obtain STAT lab verification 10 mL 11  . insulin aspart (NOVOLOG) 100 UNIT/ML injection Inject 0-5 Units into the skin at bedtime. Correction coverage: HS scale  CBG < 70: Implement Hypoglycemia Standing Orders and refer to Hypoglycemia Standing Orders sidebar report  CBG 70 - 120: 0 units  CBG 121 - 150: 0 units  CBG 151 - 200: 0 units  CBG 201 - 250: 2 units  CBG 251 - 300: 3 units  CBG 301 - 350: 4 units  CBG 351 - 400: 5 units  CBG > 400 call MD and obtain STAT lab verification 10 mL 11  . insulin aspart (NOVOLOG) 100 UNIT/ML injection Inject 18 Units into the skin 3 (three) times daily with meals. 10 mL 11  . insulin detemir (LEVEMIR) 100 UNIT/ML injection Inject 0.42 mLs (42 Units total) into the skin 2 (two) times daily. 10 mL 11  . lactulose (CHRONULAC) 10 GM/15ML solution Take 15 mLs (10 g total) by mouth every 4 (four) hours as needed (thick stools impeding stool passage thru Flexiseal). 236 mL 0  . levothyroxine (SYNTHROID) 75 MCG tablet Take 75 mcg by mouth daily.    . melatonin 3 MG TABS tablet Take 1 tablet (3 mg total) by mouth at bedtime as needed.  0  . Multiple Vitamin (MULTIVITAMIN WITH MINERALS) TABS tablet Take 1 tablet by mouth daily.    . nortriptyline (PAMELOR) 50 MG capsule Take 1 capsule (50 mg total) by mouth 2 (two) times daily.    . ondansetron (ZOFRAN) 4 MG/2ML SOLN injection Inject 2 mLs (4 mg total) into the vein every 6 (six) hours as needed for nausea. 2 mL 0  . oxyCODONE (OXYCONTIN) 20 mg 12 hr tablet Take 1  tablet (20 mg total) by mouth every 12 (twelve) hours. 14 tablet 0  . oxyCODONE 10 MG TABS Take 1 tablet (10 mg total) by mouth every 4 (four) hours as needed for breakthrough pain. 30 tablet 0  . pantoprazole (PROTONIX) 40 MG tablet Take 1 tablet (40 mg total) by mouth daily.    . polyethylene glycol (MIRALAX / GLYCOLAX) 17 g packet Take 17 g by mouth daily. 14 each 0  . pregabalin (LYRICA) 300 MG capsule Take 300 mg by mouth in the morning and at bedtime.    . saccharomyces boulardii (FLORASTOR) 250 MG capsule Take 1 capsule (250 mg total) by mouth 2 (two) times daily.    Marland Kitchen warfarin (COUMADIN) 2 MG tablet Take 1 tablet (2 mg total) by mouth daily at 4 PM.      Drug Regimen Review  Drug regimen was reviewed and remains appropriate with no significant issues identified  Home: Home Living Family/patient expects to be discharged to:: Private residence Living Arrangements: Parent Available Help at Discharge: Family,Available 24 hours/day Type of Home: House Home Access: Stairs to enter [employer] of Steps: 6 Entrance Stairs-Rails: Can reach both Home Layout: Able to live on main level with bedroom/bathroom Bathroom Shower/Tub: Engineer, manufacturing systems: Handicapped height Bathroom Accessibility: Yes Additional Comments: Had a shower stool, hand held shower  Lives With: Family (lives with mother)   Functional History: Prior Function Comments: unemployed, 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 rate 18, height 5\' 10"  (1.778 m), weight (!) 246.3 kg, SpO2 91 %. Physical Exam Gen: no distress, Morbidly obese (BMI 78.51) male, naked in bed covered with sheet. Underpad soiled with loose stool.  HEENT: oral mucosa pink and moist, NCAT Cardio: Reg rate Chest: normal effort, normal rate of breathing Abd: soft, non-distended Ext:  2-3+ edema bilateral lower extremities with venous stasis. Psych: pleasant, normal affect Skin: Sacrum wound open with ongoing loose stool--(no dressing in place due to ongoing soilage per patient)  Neuro: Alert and oriented x3. Good historian. 5/5 strength in bilateral upper extremities, 4/5 in lower extremities. Decreased sensation in bilateral hands and feet.    Results for orders placed or performed during the hospital encounter of 08/06/20 (from the past 48 hour(s))  Magnesium     Status: None   Collection Time: 08/06/20  5:35 AM  Result Value Ref Range   Magnesium 2.1 1.7 - 2.4 mg/dL    Comment: Performed at [facility], [address] [place], [place] [address]  Glucose, capillary     Status: None   Collection Time: 08/06/20  5:39 AM  Result Value Ref Range   Glucose-Capillary 80 70 - 99 mg/dL    Comment: Glucose reference range applies only to samples taken after fasting for at least 8 hours.   No results found.     Medical Problem List and Plan: 1.  Debility secondary to septic shock from necrotizing soft tissue infection of perianal, gluteal, and perineal tissues.   -patient may shower but wound must be covered  -ELOS/Goals: modA (to be provided by father) in 2-3 weeks.  2.  Antithrombotics: -DVT/anticoagulation:  Pharmaceutical: Coumadin--was on Xarelto 20 mg daily-->Resume as AKI has resolved?   -antiplatelet therapy: N/A 3. Pain Management:  Continue Lyrica and elavil for neuropathy.   -- Decrease IV Dilaudid to 0.5mg  BID PRN with dressing changes  --Oxycontin 15 mg bid with 10 mg prn for  breakthrough pain.  4. Mood: LCSW to follow for evaluation and support.   -antipsychotic agents: N/A 5. Neuropsych: This patient is capable of making decisions on his own behalf. 6. Skin/Wound Care: Air mattress for pressure relief measures.   --will need local care frequently to keep sacral wound bed clean.  --Continue foley for now.  7. Fluids/Electrolytes/Nutrition: Monitor I/O. Check CMET in am.  8. Necrotizing fascitis: Has been on augment since 02/11--> to continue for 6 months per ID 9. T2DM with neuropathy: Hgb A1c- 9.6 (down from 13 in Aug) Will continue to monitor BS ac/hs and use SSI for elevated BS  --used Farxiga, metformin and Victoza 1.8.  --Continue Levemir bid and titrate as indicated.  10 Hyponatremia: Likely due to multiple BM yesterday.   --Will recheck in am.  11. Hypokalemia:  Repeat K+ tomorrow.  12. Proximal A FIb: Monitor HR tid--continue Cardizem 240 mg and amiodarone for rate control.   --monitor for symptoms with increase in activity.  13. OIC: Finally had results yesterday--3 big BM  --Will discontinue colace to help bulk stools as now having loose stools.   14. Chronic right heel ulcer: Continue to paint wound daily.  15. Morbid obesity, BMI 78.51: provide counseling. Will need bariatric equipment.  16. Disposition: at baseline, patient received significant assistance from his mother, aged 49. He required her assistance to wipe stool after BM due to body habitus. He was able to ambulate to bathroom and back on his own. Plan is for discharge home to father, who is a IT sales professionalfirefighter, and who can provide more physical support to patient. Goal is ModA level for ambulation within his room. Discussed with patient plan for approx 2-3 week stay to reach this goal. He is currently demonstrating improved standing tolerance. Fuller SongLaura AC discussed with father.    I have personally performed a face to face diagnostic evaluation, including,  but not limited to relevant history and  physical exam findings, of this patient and developed relevant assessment and plan.  Additionally, I have reviewed and concur with the physician assistant's documentation above.  Sula Soda, MD  Jacquelynn Cree, PA-C

## 2020-08-07 DIAGNOSIS — R5381 Other malaise: Secondary | ICD-10-CM | POA: Diagnosis not present

## 2020-08-07 LAB — PROTIME-INR
INR: 2.9 — ABNORMAL HIGH (ref 0.8–1.2)
Prothrombin Time: 29.6 seconds — ABNORMAL HIGH (ref 11.4–15.2)

## 2020-08-07 LAB — CBC WITH DIFFERENTIAL/PLATELET
Abs Immature Granulocytes: 0.03 10*3/uL (ref 0.00–0.07)
Basophils Absolute: 0.1 10*3/uL (ref 0.0–0.1)
Basophils Relative: 1 %
Eosinophils Absolute: 0.4 10*3/uL (ref 0.0–0.5)
Eosinophils Relative: 7 %
HCT: 30 % — ABNORMAL LOW (ref 39.0–52.0)
Hemoglobin: 9 g/dL — ABNORMAL LOW (ref 13.0–17.0)
Immature Granulocytes: 1 %
Lymphocytes Relative: 45 %
Lymphs Abs: 2.5 10*3/uL (ref 0.7–4.0)
MCH: 25.9 pg — ABNORMAL LOW (ref 26.0–34.0)
MCHC: 30 g/dL (ref 30.0–36.0)
MCV: 86.5 fL (ref 80.0–100.0)
Monocytes Absolute: 0.5 10*3/uL (ref 0.1–1.0)
Monocytes Relative: 10 %
Neutro Abs: 2 10*3/uL (ref 1.7–7.7)
Neutrophils Relative %: 36 %
Platelets: 294 10*3/uL (ref 150–400)
RBC: 3.47 MIL/uL — ABNORMAL LOW (ref 4.22–5.81)
RDW: 17.1 % — ABNORMAL HIGH (ref 11.5–15.5)
WBC: 5.5 10*3/uL (ref 4.0–10.5)
nRBC: 0 % (ref 0.0–0.2)

## 2020-08-07 LAB — COMPREHENSIVE METABOLIC PANEL
ALT: 16 U/L (ref 0–44)
AST: 20 U/L (ref 15–41)
Albumin: 1.7 g/dL — ABNORMAL LOW (ref 3.5–5.0)
Alkaline Phosphatase: 80 U/L (ref 38–126)
Anion gap: 10 (ref 5–15)
BUN: 5 mg/dL — ABNORMAL LOW (ref 6–20)
CO2: 30 mmol/L (ref 22–32)
Calcium: 8.1 mg/dL — ABNORMAL LOW (ref 8.9–10.3)
Chloride: 96 mmol/L — ABNORMAL LOW (ref 98–111)
Creatinine, Ser: 0.86 mg/dL (ref 0.61–1.24)
GFR, Estimated: >60 mL/min (ref >60)
Glucose, Bld: 86 mg/dL (ref 70–99)
Potassium: 3.2 mmol/L — ABNORMAL LOW (ref 3.5–5.1)
Sodium: 136 mmol/L (ref 135–145)
Total Bilirubin: 0.6 mg/dL (ref 0.3–1.2)
Total Protein: 5.9 g/dL — ABNORMAL LOW (ref 6.5–8.1)

## 2020-08-07 LAB — GLUCOSE, CAPILLARY
Glucose-Capillary: 79 mg/dL (ref 70–99)
Glucose-Capillary: 116 mg/dL — ABNORMAL HIGH (ref 70–99)
Glucose-Capillary: 130 mg/dL — ABNORMAL HIGH (ref 70–99)
Glucose-Capillary: 141 mg/dL — ABNORMAL HIGH (ref 70–99)

## 2020-08-07 MED ORDER — POTASSIUM CHLORIDE CRYS ER 20 MEQ PO TBCR
40.0000 meq | EXTENDED_RELEASE_TABLET | Freq: Two times a day (BID) | ORAL | Status: AC
  Administered 2020-08-07 (×2): 40 meq via ORAL
  Filled 2020-08-07 (×2): qty 2

## 2020-08-07 NOTE — Progress Notes (Signed)
Occupational Therapy Session Note  Patient Details  Name: [name] MRN: [id number] Date of Birth: [birth year]  Today's Date: 08/07/2020 OT Individual Time: 7672-0947 OT Individual Time Calculation (min): 55 min    Short Term Goals: Week 1:  OT Short Term Goal 1 (Week 1): Patient will tolerate sitting EOB for 15 minutes in preparation for BADL task OT Short Term Goal 2 (Week 1): Patient will complete sit<>stand with max A +2 in prep for transfer OT Short Term Goal 3 (Week 1): Patient will complete 50% of U Bbathing task while seated EOB.  Skilled Therapeutic Interventions/Progress Updates:    Pt resting in bed upon arrival. Pt agreeable to sitting EOB for UB bathing/dressing tasks. Supine>sit EOB with min A using hospital bed functions. Sitting balance with supervision. Pt completed UB bathing/dressing with supervision. Bariatirc chair positioned for pt to use handles to assist with standing from EOB. Pt attempted to stand with +2 assistance and sheet for gait belt. Pt unable to achieve upright standing. Pt slightly discouraged but responded to encouragement offered. Pt returned to supine and demonstrated sidelying squats using bed functions for tilting bed. Pt remained in bed with all needs within reach.   Therapy Documentation Precautions:  Precautions Precautions: Fall Precaution Comments: foley catheter, large buttocks wound, R heel wound Restrictions Weight Bearing Restrictions: No   Pain: Pain Assessment Pain Scale: 0-10 Pain Score: 9  Pain Type: Surgical pain Pain Location: Buttocks Patients Stated Pain Goal: 4 Pain Intervention(s):premedicated, repositioned  Therapy/Group: Individual Therapy  [name] 08/07/2020, 9:18 AM

## 2020-08-07 NOTE — Progress Notes (Signed)
Inpatient Rehabilitation Care Coordinator Assessment and Plan Patient Details  Name: Carl Hill MRN: [id number] Date of Birth: 08-Aug-1970  Today's Date: 08/07/2020  Hospital Problems: Principal Problem:   Debility  Past Medical History:  Past Medical History:  Diagnosis Date  . 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 Asthma   . Atrial fibrillation ([place])   . Diabetes mellitus without complication ([place])   . History of cardioversion    3 times   . Hypertension   . Sepsis due to Streptococcus, group B ([place]) 06/22/2020   Past Surgical History:  Past Surgical History:  Procedure Laterality Date  . ABDOMINAL SURGERY    . INCISION AND DRAINAGE PERIRECTAL ABSCESS N/A 06/11/2020   Procedure: IRRIGATION AND DEBRIDEMENT PERINEAL WOUND;  Surgeon: Dwan Bolt, MD;  Location: [place];  Service: General;  Laterality: N/A;  . IRRIGATION AND DEBRIDEMENT ABSCESS N/A 06/10/2020   Procedure: IRRIGATION AND DEBRIDEMENT ABSCESS;  Surgeon: Dwan Bolt, MD;  Location: [place];  Service: General;  Laterality: N/A;  . LAPAROSCOPIC GASTRIC SLEEVE RESECTION     Social History:  reports that he has been smoking. He has never used smokeless tobacco. He reports previous alcohol use. He reports that he does not use drugs.  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 Recent Psychosocial Issues: Denies Psychiatric History: Denies Substance Abuse History: Denies; stated he stopped smoking cigarettes on 06/10/20 due to hospital admission. States 1 pk would last him a week. Rarely etoh use.  Patient / Family Perceptions, Expectations & Goals Pt/Family understanding of illness & functional limitations: Pt and family have general understanding of care needs Premorbid pt/family roles/activities: Some assistance with ADLs Anticipated changes in roles/activities/participation: Assistanec with ADLs/IADLs  Community Resources Express Scripts: None Premorbid Home Care/DME Agencies: None Transportation available at discharge: mother Resource referrals recommended: Neuropsychology  Discharge Planning Living Arrangements: Parent Support Systems: Parent Type of Residence: Private residence Insurance Resources: Medicaid (specify county) ([place]) Museum/gallery curator Resources: SSD Financial Screen Referred: No Living Expenses: Own Money Management: Patient,Other (Comment) (Mother pays all the bills) Does the patient have any problems obtaining your medications?: No Home Management: Pt was able to help with some homecare needs if able too Patient/Family Preliminary Plans: Mother to manage homecare needs Care Coordinator Barriers to Discharge: Decreased caregiver support,Lack of/limited family support Care Coordinator Anticipated Follow Up Needs: HH/OP Expected length of stay: 28-30 days  Clinical Impression SW met with tp and pt mother in room to introduce self, explain role, and discuss discharge process. Pt not a veteran. No HCPOA forms, but would want his  mother to make any medical decisions. DME- bariatric RW for community, and cane for household distances. RW could not be used in the home. Pt has 8 steps to enter the home with bilateral hand rails.  Auria A Chamberlain 08/07/2020, 6:09 PM   

## 2020-08-07 NOTE — Progress Notes (Signed)
Physical Therapy Session Note  Patient Details  Name: Carl Hill MRN: [id number] Date of Birth: [birth year]  Today's Date: 08/07/2020 PT Individual Time: 1400-1453 PT Individual Time Calculation (min): 53 min   Short Term Goals: Week 1:  PT Short Term Goal 1 (Week 1): Pt will tolerate sitting EOB with supervision x 10 minutes while completing a functional task PT Short Term Goal 2 (Week 1): Pt will perform STS with min A x 2 consistently PT Short Term Goal 3 (Week 1): Pt will initiate gait training.  Skilled Therapeutic Interventions/Progress Updates:    Pt resting in bed with his mother present and agreeable to therapy. Supine>sit x 2 with supervision, sit>supine with min assist for LE management, managing bed features and foley. Pt maintains sitting balance with supervision. Performed STS with locked bari recliner positioned in front of patient, with leg rests blocking knees, and sheet sling at pt's hips. Pt was able to pull up to stand using the arms of the chair with max A x 2 and stood for 20 sec. LAQ while seated EOB 3 x 5 BIL to improve LE strength. Pt returned to supine after session and was left with all needs in reach and family present.  Therapy Documentation Precautions:  Precautions Precautions: Fall Precaution Comments: foley catheter, large buttocks wound, R heel wound Restrictions Weight Bearing Restrictions: No    Therapy/Group: Individual Therapy  Dyane Dustman, SPT 08/07/2020, 3:06 PM

## 2020-08-07 NOTE — Progress Notes (Signed)
Physical Therapy Session Note  Patient Details  Name: Carl Hill MRN: [id number] Date of Birth: Apr 21, 1971  Today's Date: 08/07/2020 PT Individual Time: 0931-1005 PT Individual Time Calculation (min): 34 min   Short Term Goals: Week 1:  PT Short Term Goal 1 (Week 1): Pt will tolerate sitting EOB with supervision x 10 minutes while completing a functional task PT Short Term Goal 2 (Week 1): Pt will perform STS with min A x 2 consistently PT Short Term Goal 3 (Week 1): Pt will initiate gait training.      Skilled Therapeutic Interventions/Progress Updates:    pt received in bed and agreeable to therapy. Pt reported 7/10 pain globally post sitting up with previous therapy session. Pt requested to remain in bed for this session 2/2 pain and having sat up throughout previous session. Pt directed in semi-reclined position for BUE exercises with orange therapy band x25 D2 PNF pattern, bicep curls, tricep curls. Pt left in bed, tilted to pt's L side for skin integrity. Pt very pleasant and motivated to improve functionally and progress with therapy. Pt left with All needs in reach and in good condition. Call light in hand.    Therapy Documentation Precautions:  Precautions Precautions: Fall Precaution Comments: foley catheter, large buttocks wound, R heel wound Restrictions Weight Bearing Restrictions: No General:   Vital Signs:   Pain: Pain Assessment Pain Scale: 0-10 Pain Score: 9  Pain Type: Surgical pain Pain Location: Buttocks Patients Stated Pain Goal: 4 Pain Intervention(s): Medication (See eMAR) Mobility:   Locomotion :    Trunk/Postural Assessment :    Balance:   Exercises:   Other Treatments:      Therapy/Group: Individual Therapy  Barbaraann Faster 08/07/2020, 11:02 AM

## 2020-08-07 NOTE — Progress Notes (Signed)
PROGRESS NOTE   Subjective/Complaints:  Notes IV Dilaudid for sacral wound care, not foot. Slept OK.  Pain "tolerable".  LBM last night.  Was soft.   Also having a lot of gas Still has foley.  Took muscle relaxant this AM to try and help muscle spasms that interfered in therapy yesterday.    ROS:  Pt denies SOB, abd pain, CP, N/V/C/D, and vision changes   Objective:   No results found. Recent Labs    08/07/20 0620  WBC 5.5  HGB 9.0*  HCT 30.0*  PLT 294   Recent Labs    08/05/20 0515 08/07/20 0620  NA 133* 136  K 3.6 3.2*  CL 93* 96*  CO2 29 30  GLUCOSE 132* 86  BUN 9 5*  CREATININE 1.04 0.86  CALCIUM 8.3* 8.1*    Intake/Output Summary (Last 24 hours) at 08/07/2020 0855 Last data filed at 08/07/2020 2671 Gross per 24 hour  Intake 960 ml  Output 6925 ml  Net -5965 ml     Pressure Injury 08/06/20 Heel Right Stage 2 -  Partial thickness loss of dermis presenting as a shallow open injury with a red, pink wound bed without slough. (Active)  08/06/20 0240  Location: Heel  Location Orientation: Right  Staging: Stage 2 -  Partial thickness loss of dermis presenting as a shallow open injury with a red, pink wound bed without slough.  Wound Description (Comments):   Present on Admission: Yes    Physical Exam: Vital Signs Blood pressure 128/74, pulse 96, temperature 97.7 F (36.5 C), resp. rate 18, height 5\' 10"  (1.778 m), weight (!) 246.3 kg, SpO2 96 %.  Physical Exam   General: awake, alert, appropriate, laying supine in bariatric low air loss mattress, NAD HENT: conjugate gaze; oropharynx moist- trach site C/D/I- but underneath has small scab- that's too moist- will remove dressing.  CV: regular rate; no JVD Pulmonary: CTA B/L; no W/R/R- good air movement- decreased at bases B/L GI: soft, NT, ND, (+)BS; protuberant; normoactive Psychiatric: appropriate- smiling- interactive Neurological:  Ox3 Skin: Sacrum wound C/D/I- cannot assess today Finger on L hand bleeding- had 3 tissues as bedside covered in little bleeding spots- so had occurred for at least 15 minutes.   Musculoskeletal:  5/5 strength in bilateral upper extremities, 4/5 in lower extremities. Decreased sensation in bilateral hands and feet.     Assessment/Plan: 1. Functional deficits which require 3+ hours per day of interdisciplinary therapy in a comprehensive inpatient rehab setting.  Physiatrist is providing close team supervision and 24 hour management of active medical problems listed below.  Physiatrist and rehab team continue to assess barriers to discharge/monitor patient progress toward functional and medical goals  Care Tool:  Bathing    Body parts bathed by patient: Chest,Abdomen,Left arm,Right arm,Face   Body parts bathed by helper: Left lower leg,Right lower leg,Left upper leg,Right upper leg,Buttocks,Front perineal area     Bathing assist Assist Level: 2 Helpers     Upper Body Dressing/Undressing Upper body dressing   What is the patient wearing?: Hospital gown only    Upper body assist Assist Level: Maximal Assistance - Patient 25 - 49%    Lower Body  Dressing/Undressing Lower body dressing      What is the patient wearing?: Hospital gown only     Lower body assist Assist for lower body dressing: Total Assistance - Patient < 25%     Toileting Toileting    Toileting assist Assist for toileting: Total Assistance - Patient < 25%     Transfers Chair/bed transfer  Transfers assist  Chair/bed transfer activity did not occur: Safety/medical concerns        Locomotion Ambulation   Ambulation assist   Ambulation activity did not occur: Safety/medical concerns          Walk 10 feet activity   Assist  Walk 10 feet activity did not occur: Safety/medical concerns        Walk 50 feet activity   Assist Walk 50 feet with 2 turns activity did not occur:  Safety/medical concerns         Walk 150 feet activity   Assist Walk 150 feet activity did not occur: Safety/medical concerns         Walk 10 feet on uneven surface  activity   Assist Walk 10 feet on uneven surfaces activity did not occur: Safety/medical concerns         Wheelchair     Assist Will patient use wheelchair at discharge?: Yes Type of Wheelchair: Manual Wheelchair activity did not occur: Safety/medical concerns         Wheelchair 50 feet with 2 turns activity    Assist    Wheelchair 50 feet with 2 turns activity did not occur: Safety/medical concerns       Wheelchair 150 feet activity     Assist  Wheelchair 150 feet activity did not occur: Safety/medical concerns       Blood pressure 128/74, pulse 96, temperature 97.7 F (36.5 C), resp. rate 18, height 5\' 10"  (1.778 m), weight (!) 246.3 kg, SpO2 96 %.  Medical Problem List and Plan: 1.  Debility secondary to septic shock from necrotizing soft tissue infection of perianal, gluteal, and perineal tissues.              -patient may shower but wound must be covered             -ELOS/Goals: modA (to be provided by father) in 2-3 weeks.   3/8- set d/c date at 4/5 at latest as d/c date at team conference today-  2.  Antithrombotics: -DVT/anticoagulation:  Pharmaceutical: Coumadin--was on Xarelto 20 mg daily-->Resume as AKI has resolved?  3/8- AKI has resolved, so restarted Xarelto and stopped Coumadin- esp since finger was bleeding so continuous today.  3/9- Pharmacy concerned about pt stopping coumadin- but explained he doesn't have easy way to get out of the home to get Coumadin checked at home- so would prefer he would be on Xarelto- changing- they said holding til he gets to INR <2.0-              -antiplatelet therapy: N/A 3. Pain Management:  Continue Lyrica and elavil for neuropathy.              -- Decrease IV Dilaudid to 0.5mg  BID PRN with dressing changes             --Oxycontin  15 mg bid with 10 mg prn for breakthrough pain.   3/8- will make sure has Dilaudid for dressing changes, and see them done Thursday-Since also c/o muscle spasms, will add Robaxin 500 mg QID prn for muscle spasms.    3/9- pain "  tolerable"- taking muscle relaxers for muscle tightness 4. Mood: LCSW to follow for evaluation and support.              -antipsychotic agents: N/A 5. Neuropsych: This patient is capable of making decisions on his own behalf. 6. Skin/Wound Care: Air mattress for pressure relief measures.              --will need local care frequently to keep sacral wound bed clean.             --Continue foley for now.  3/8- cannot have flexiseal since in Rehab- will d/w nursing  3/9- incontinent of stool- LBM last night- con't regimen  7. Fluids/Electrolytes/Nutrition: Monitor I/O. Check CMET in am.  8. Necrotizing fascitis: Has been on augment since 02/11--> to continue for 6 months per ID 9. T2DM with neuropathy: Hgb A1c- 9.6 (down from 13 in Aug) Will continue to monitor BS ac/hs and use SSI for elevated BS             --used Farxiga, metformin and Victoza 1.8.             --Continue Levemir bid and titrate as indicated.  10 Hyponatremia: Likely due to multiple BM yesterday.              --Will recheck in am.  11. Hypokalemia:  Repeat K+ tomorrow.   3/8- K+ 3.6- doing better- con't regimen  3/9- K+ 3.2 again today- will replete with 40 mEq x2 and recheck tomorrow.  12. Proximal A FIb: Monitor HR tid--continue Cardizem 240 mg and amiodarone for rate control.              --monitor for symptoms with increase in activity.  13. OIC: Finally had results yesterday--3 big BM             --Will discontinue colace to help bulk stools as now having loose stools.  3/8- decreased colace today- it wasn't stopped, to bulk up stools.   Decreased to daily, but might need to stop- con't miralax, due to being on chronic pain meds. 14. Chronic right heel ulcer: Continue to paint wound daily with  iodine.  15. Morbid obesity, BMI 78.51: provide counseling. Will need bariatric equipment. 16. Trach site  3/9- healing- but leave open to air.   17. Disposition: at baseline, patient received significant assistance from his mother, aged 36. He required her assistance to wipe stool after BM due to body habitus. He was able to ambulate to bathroom and back on his own. Plan is for discharge home to father, who is a IT sales professional, and who can provide more physical support to patient. Goal is ModA level for ambulation within his room. Discussed with patient plan for approx 2-3 week stay to reach this goal. He is currently demonstrating improved standing tolerance. Fuller Song discussed with father.    LOS: 1 days A FACE TO FACE EVALUATION WAS PERFORMED  Zipporah Finamore 08/07/2020, 8:55 AM

## 2020-08-07 NOTE — Care Management (Signed)
[facility] Individual Statement of Services  Patient Name:  Carl Hill  Date:  08/07/2020  Welcome to the [facility].  Our goal is to provide you with an individualized program based on your diagnosis and situation, designed to meet your specific needs.  With this comprehensive rehabilitation program, you will be expected to participate in at least 3 hours of rehabilitation therapies Monday-Friday, with modified therapy programming on the weekends.  Your rehabilitation program will include the following services:  Physical Therapy (PT), Occupational Therapy (OT), 24 hour per day rehabilitation nursing, Therapeutic Recreaction (TR), Psychology, Neuropsychology, Care Coordinator, Rehabilitation Medicine, Nutrition Services, Pharmacy Services and Other  Weekly team conferences will be held on Tuesdays to discuss your progress.  Your Inpatient Rehabilitation Care Coordinator will talk with you frequently to get your input and to update you on team discussions.  Team conferences with you and your family in attendance may also be held.  Expected length of stay: 28- 30 days    Overall anticipated outcome: Minimal Assistance  Depending on your progress and recovery, your program may change. Your Inpatient Rehabilitation Care Coordinator will coordinate services and will keep you informed of any changes. Your Inpatient Rehabilitation Care Coordinator's name and contact numbers are listed  below.  The following services may also be recommended but are not provided by the [facility]:   Driving Evaluations  Home Health Rehabiltiation Services  Outpatient Rehabilitation Services  Vocational Rehabilitation   Arrangements will be made to provide these services after discharge if needed.  Arrangements include referral to agencies that provide these services.  Your insurance has been verified to be:  Medicaid  Your primary doctor is:   Synetta Fail  Pertinent information will be shared with your doctor and your insurance company.  Inpatient Rehabilitation Care Coordinator:  Susie Cassette [phone] or ([phone]  Information discussed with and copy given to patient by: Gretchen Short, 08/07/2020, 1:35 PM

## 2020-08-07 NOTE — Progress Notes (Signed)
Physical Therapy Session Note  Patient Details  Name: Carl Hill MRN: [id number] Date of Birth: 1971/03/31  Today's Date: 08/07/2020 PT Individual Time: 1003-1030 PT Individual Time Calculation (min): 27 min   Short Term Goals: Week 1:  PT Short Term Goal 1 (Week 1): Pt will tolerate sitting EOB with supervision x 10 minutes while completing a functional task PT Short Term Goal 2 (Week 1): Pt will perform STS with min A x 2 consistently PT Short Term Goal 3 (Week 1): Pt will initiate gait training.  Skilled Therapeutic Interventions/Progress Updates:    Patient received reclined in bed, agreeable to PT. Patient requesting to complete bed level exercise. He was able to tolerate Trendelenburg bed position for ~20 mins at 30* tilt. He completed modified squats in this position 4x20, calf raises 4x20. He remained hyperverbal throughout session and minimally redirectable impacting progress through session. He remained in bed, bed in lowest position, call light within reach.   Therapy Documentation Precautions:  Precautions Precautions: Fall Precaution Comments: foley catheter, large buttocks wound, R heel wound Restrictions Weight Bearing Restrictions: No    Therapy/Group: Individual Therapy  Elizebeth Koller, PT, DPT, CBIS  08/07/2020, 7:33 AM

## 2020-08-08 LAB — PROTIME-INR
INR: 2.6 — ABNORMAL HIGH (ref 0.8–1.2)
Prothrombin Time: 27 seconds — ABNORMAL HIGH (ref 11.4–15.2)

## 2020-08-08 LAB — GLUCOSE, CAPILLARY
Glucose-Capillary: 88 mg/dL (ref 70–99)
Glucose-Capillary: 97 mg/dL (ref 70–99)
Glucose-Capillary: 72 mg/dL (ref 70–99)
Glucose-Capillary: 123 mg/dL — ABNORMAL HIGH (ref 70–99)

## 2020-08-08 LAB — BASIC METABOLIC PANEL
Anion gap: 8 (ref 5–15)
BUN: 6 mg/dL (ref 6–20)
CO2: 32 mmol/L (ref 22–32)
Calcium: 8.4 mg/dL — ABNORMAL LOW (ref 8.9–10.3)
Chloride: 98 mmol/L (ref 98–111)
Creatinine, Ser: 0.96 mg/dL (ref 0.61–1.24)
GFR, Estimated: >60 mL/min (ref >60)
Glucose, Bld: 109 mg/dL — ABNORMAL HIGH (ref 70–99)
Potassium: 3.9 mmol/L (ref 3.5–5.1)
Sodium: 138 mmol/L (ref 135–145)

## 2020-08-08 LAB — MAGNESIUM: Magnesium: 1.9 mg/dL (ref 1.7–2.4)

## 2020-08-08 NOTE — Progress Notes (Signed)
PROGRESS NOTE   Subjective/Complaints:  Asking about Dilaudid IV for wound dressing changes- doesn't have IV. Will have IV placed.   Stood with PT once.  LBM last night- incontinent.   Wants to keep foley because he's having "so many other problems". York Spaniel had been having retention, but wants to wait to remove- explained increased risk of infection WITH a foley- but he's emphatic cannot remove due to incontinence with it out.  Will remove Monday- to compromise with pt about timing. Will need male urinals, per pt.   Also, wants to con't Xarelto- was on at home- doesn't want coumadin.  Also was on a regimen for BGs at home that didn't have insulin, but wants to do insulin since noted BGs are so much better controlled.   ROS:  Pt denies SOB, abd pain, CP, N/V/C/D, and vision changes   Objective:   No results found. Recent Labs    08/07/20 0620  WBC 5.5  HGB 9.0*  HCT 30.0*  PLT 294   Recent Labs    08/07/20 0620 08/08/20 0523  NA 136 138  K 3.2* 3.9  CL 96* 98  CO2 30 32  GLUCOSE 86 109*  BUN 5* 6  CREATININE 0.86 0.96  CALCIUM 8.1* 8.4*    Intake/Output Summary (Last 24 hours) at 08/08/2020 0917 Last data filed at 08/08/2020 0830 Gross per 24 hour  Intake 1320 ml  Output 7975 ml  Net -6655 ml     Pressure Injury 08/06/20 Heel Right Stage 2 -  Partial thickness loss of dermis presenting as a shallow open injury with a red, pink wound bed without slough. (Active)  08/06/20 0240  Location: Heel  Location Orientation: Right  Staging: Stage 2 -  Partial thickness loss of dermis presenting as a shallow open injury with a red, pink wound bed without slough.  Wound Description (Comments):   Present on Admission: Yes    Physical Exam: Vital Signs Blood pressure (!) 146/96, pulse 93, temperature 97.9 F (36.6 C), resp. rate 19, height 5\' 10"  (1.778 m), weight (!) 246.3 kg, SpO2 98 %.  Physical  Exam    General: awake, alert, appropriate, laying supine in bariatric bed,  NAD HENT: conjugate gaze; oropharynx moist- scab over trach coming off- healed underneath CV: regular rate; no JVD Pulmonary: CTA B/L; no W/R/R- good air movement GI: soft, NT, ND, (+)BS- protuberant- hyperactive Psychiatric: appropriate- asking appropriate questions Neurological: Ox3 Skin: Sacrum wound C/D/I- cannot assess today L 2nd toe has dried blood and dressing on it- stable.   Musculoskeletal:  5/5 strength in bilateral upper extremities, 4/5 in lower extremities. Decreased sensation in bilateral hands and feet.     Assessment/Plan: 1. Functional deficits which require 3+ hours per day of interdisciplinary therapy in a comprehensive inpatient rehab setting.  Physiatrist is providing close team supervision and 24 hour management of active medical problems listed below.  Physiatrist and rehab team continue to assess barriers to discharge/monitor patient progress toward functional and medical goals  Care Tool:  Bathing    Body parts bathed by patient: Right arm,Left arm,Chest,Abdomen,Face,Right upper leg,Left upper leg   Body parts bathed by helper: Front perineal area,Buttocks,Right lower  leg,Left lower leg     Bathing assist Assist Level: Maximal Assistance - Patient 24 - 49%     Upper Body Dressing/Undressing Upper body dressing   What is the patient wearing?: Pull over shirt    Upper body assist Assist Level: Supervision/Verbal cueing    Lower Body Dressing/Undressing Lower body dressing      What is the patient wearing?: Incontinence brief     Lower body assist Assist for lower body dressing: Maximal Assistance - Patient 25 - 49%     Toileting Toileting    Toileting assist Assist for toileting: Dependent - Patient 0% (foley)     Transfers Chair/bed transfer  Transfers assist  Chair/bed transfer activity did not occur: Safety/medical concerns         Locomotion Ambulation   Ambulation assist   Ambulation activity did not occur: Safety/medical concerns          Walk 10 feet activity   Assist  Walk 10 feet activity did not occur: Safety/medical concerns        Walk 50 feet activity   Assist Walk 50 feet with 2 turns activity did not occur: Safety/medical concerns         Walk 150 feet activity   Assist Walk 150 feet activity did not occur: Safety/medical concerns         Walk 10 feet on uneven surface  activity   Assist Walk 10 feet on uneven surfaces activity did not occur: Safety/medical concerns         Wheelchair     Assist Will patient use wheelchair at discharge?: Yes Type of Wheelchair: Manual Wheelchair activity did not occur: Safety/medical concerns         Wheelchair 50 feet with 2 turns activity    Assist    Wheelchair 50 feet with 2 turns activity did not occur: Safety/medical concerns       Wheelchair 150 feet activity     Assist  Wheelchair 150 feet activity did not occur: Safety/medical concerns       Blood pressure (!) 146/96, pulse 93, temperature 97.9 F (36.6 C), resp. rate 19, height 5\' 10"  (1.778 m), weight (!) 246.3 kg, SpO2 98 %.  Medical Problem List and Plan: 1.  Debility secondary to septic shock from necrotizing soft tissue infection of perianal, gluteal, and perineal tissues.              -patient may shower but wound must be covered             -ELOS/Goals: modA (to be provided by father) in 2-3 weeks.   3/8- set d/c date at 4/5 at latest as d/c date at team conference today-  3/10- stood x1 with therapy and a LOT of help.   2.  Antithrombotics: -DVT/anticoagulation:  Pharmaceutical: Coumadin--was on Xarelto 20 mg daily-->Resume as AKI has resolved?  3/8- AKI has resolved, so restarted Xarelto and stopped Coumadin- esp since finger was bleeding so continuous today.  3/9- Pharmacy concerned about pt stopping coumadin- but explained he  doesn't have easy way to get out of the home to get Coumadin checked at home- so would prefer he would be on Xarelto- changing- they said holding til he gets to INR <2.0-  3/10- pt wants Xarelto- was on at home- doesn't want coumadin             -antiplatelet therapy: N/A 3. Pain Management:  Continue Lyrica and elavil for neuropathy.              --  Decrease IV Dilaudid to 0.5mg  BID PRN with dressing changes             --Oxycontin 15 mg bid with 10 mg prn for breakthrough pain.   3/8- will make sure has Dilaudid for dressing changes, and see them done Thursday-Since also c/o muscle spasms, will add Robaxin 500 mg QID prn for muscle spasms.    3/9- pain "tolerable"- taking muscle relaxers for muscle tightness  3/10- will get pt IV for IV pain meds for dressing changes, not therapy 4. Mood: LCSW to follow for evaluation and support.              -antipsychotic agents: N/A 5. Neuropsych: This patient is capable of making decisions on his own behalf. 6. Skin/Wound Care: Air mattress for pressure relief measures.              --will need local care frequently to keep sacral wound bed clean.             --Continue foley for now.  3/8- cannot have flexiseal since in Rehab- will d/w nursing  3/9- incontinent of stool- LBM last night- con't regimen   3/10- con't sacral wound care daily and prn 7. Fluids/Electrolytes/Nutrition: Monitor I/O. Check CMET in am.  8. Necrotizing fascitis: Has been on augment since 02/11--> to continue for 6 months per ID 9. T2DM with neuropathy: Hgb A1c- 9.6 (down from 13 in Aug) Will continue to monitor BS ac/hs and use SSI for elevated BS             --used Farxiga, metformin and Victoza 1.8.             --Continue Levemir bid and titrate as indicated  3/20- pt reports wants to continue insulin  And current regimen, since BGs at home were 200s; here are 80s-low 100s- much happier- will con't regimen 10 Hyponatremia: Likely due to multiple BM yesterday.               --Will recheck in am.  11. Hypokalemia:  Repeat K+ tomorrow.   3/8- K+ 3.6- doing better- con't regimen  3/9- K+ 3.2 again today- will replete with 40 mEq x2 and recheck tomorrow.  3/10- K+ is 3.9- wwill recheck Monday- if continues to drop, might need scheduled Kdur  12. Proximal A FIb: Monitor HR tid--continue Cardizem 240 mg and amiodarone for rate control.              --monitor for symptoms with increase in activity.  13. OIC: Finally had results yesterday--3 big BM             --Will discontinue colace to help bulk stools as now having loose stools.  3/8- decreased colace today- it wasn't stopped, to bulk up stools.   Decreased to daily, but might need to stop- con't miralax, due to being on chronic pain meds. 14. Chronic right heel ulcer: Continue to paint wound daily with iodine.  15. Morbid obesity, BMI 78.51: provide counseling. Will need bariatric equipment. 16. Trach site  3/9- healing- but leave open to air. 17. Foley for urinary retention  3/10- will remove Monday - he refuses to remove now.    18. Disposition:  at baseline, patient received significant assistance from his mother, aged 49. He required her assistance to wipe stool after BM due to body habitus. He was able to ambulate to bathroom and back on his own. Plan is for discharge home to father, who is a IT sales professionalfirefighter, and who  can provide more physical support to patient. Goal is ModA level for ambulation within his room. Discussed with patient plan for approx 2-3 week stay to reach this goal. He is currently demonstrating improved standing tolerance. Fuller Song discussed with father.    LOS: 2 days A FACE TO FACE EVALUATION WAS PERFORMED  Chyane Greer 08/08/2020, 9:17 AM

## 2020-08-08 NOTE — Progress Notes (Signed)
Occupational Therapy Session Note  Patient Details  Name: Carl Hill MRN: [id number] Date of Birth: 1971-05-27  Today's Date: 08/08/2020 OT Individual Time: 1400-1425 OT Individual Time Calculation (min): 25 min    Short Term Goals: Week 1:  OT Short Term Goal 1 (Week 1): Patient will tolerate sitting EOB for 15 minutes in preparation for BADL task OT Short Term Goal 2 (Week 1): Patient will complete sit<>stand with max A +2 in prep for transfer OT Short Term Goal 3 (Week 1): Patient will complete 50% of U Bbathing task while seated EOB.  Skilled Therapeutic Interventions/Progress Updates:    Pt resting in bed upon arrival. OT intervention with focus on BUE therex semi reclined at 15 degrees. 4# bar tapping beach ball back to therapist 4x15. Straight arm raises with 4# bar 15x3. Single arm chest presses with 7# barbell 4x15 with each UE. Pt remained in bed with all needs within reach and Mom present.   Therapy Documentation Precautions:  Precautions Precautions: Fall Precaution Comments: foley catheter, large buttocks wound, R heel wound Restrictions Weight Bearing Restrictions: No  Pain: Pain Assessment Pain Scale: 0-10 Pain Score: 5  Pain Type: Acute pain;Chronic pain Pain Location: Back Pain Orientation: Lower;Right;Left Pain Descriptors / Indicators: Aching Pain Frequency: Constant Pain Onset: On-going Pain Intervention(s): Medication (See eMAR);Repositioned;Emotional support 2nd Pain Site Pain Score: 8 Pain Type: Acute pain Pain Location: Buttocks Pain Orientation: Right;Left Pain Descriptors / Indicators: Aching Pain Frequency: Constant Pain Onset: With Activity Patient's Stated Pain Goal: 2 Pain Intervention(s): Medication (See eMAR);Repositioned;Emotional support   Exercises: General Exercises - Upper Extremity Shoulder Flexion: Strengthening;15 reps x 3;Both;Supine;Bar weights/barbell 7# Other Treatments:     Therapy/Group: Individual  Therapy  [name] 08/08/2020, 2:48 PM

## 2020-08-08 NOTE — Evaluation (Signed)
Recreational Therapy Assessment and Plan  Patient Details  Name: Carl Hill MRN: [id number] Date of Birth: 05/08/1971 Today's Date: 08/08/2020  Rehab Potential:  Good ELOS:   4 weeks  Assessment Hospital Problem: Principal Problem:   Debility   Past Medical History:      Past Medical History:  Diagnosis Date  . 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 Asthma   . Atrial fibrillation ([place])   . Diabetes mellitus without complication ([place])   . History of cardioversion    3 times   . Hypertension   . Sepsis due to Streptococcus, group B ([place]) 06/22/2020   Past Surgical History:       Past Surgical History:  Procedure Laterality Date  . ABDOMINAL SURGERY    . INCISION AND DRAINAGE PERIRECTAL ABSCESS N/A 06/11/2020   Procedure: IRRIGATION AND DEBRIDEMENT PERINEAL WOUND;  Surgeon: Dwan Bolt, MD;  Location: [facility];  Service: General;  Laterality: N/A;  . IRRIGATION AND DEBRIDEMENT ABSCESS N/A 06/10/2020   Procedure: IRRIGATION AND DEBRIDEMENT ABSCESS;  Surgeon: Dwan Bolt, MD;  Location: [place] [facility];  Service: General;  Laterality: N/A;  . LAPAROSCOPIC GASTRIC SLEEVE RESECTION      Assessment & Plan Clinical Impression: Patient is a 50 y.o. year old male with recent admission to the hospital on 06/10/20 with fever, chills, leucocytosis and AKI from septic shock due to necrotizing soft tissue infection of perianal, gluteal and perineal tissues. He was taken to OR for excisional debridement the same day by Dr. Michaelle Birks. Later that day he developed A fib with RVR/VT arrest s/p CPR. He was taken back to OR the next day due to further necrosis requiring debridement and wound cultures showed group B streptococcus actinomyces, E coli and abundant peptostreptococcus species. He was treated with broad spectrum antibiotics but continued to have fevers up to 105 and Dr. Tommy Medal recommended consolidating antibiotics to [facility] for IV antibiotics X Weeks and has been  transitioned to Augmentin X 6 months.   He required tracheostomy on 01/26, has been weaned to ATC and tolerating regular diet. He self decannulated accidentally on 02/24 and respiratory status stable. Surgery recommended wet to dry dressing with hydrotherapy which has helped to clean up wound bed. CCS has deferred to [facility] for wound care and patient to continue wound local measures with dressing change for management of moderate yellow exudate with wet to dry dressing and flexiseal for bowel management. Nephrology consulted for input on volume overload with hypernatremia and AKI -->felt to be due to AKI/ATN with osmotic diuresis and has resolved with free water and resolution of polyuria.   On coumadinnowwith amiodarone/cardizem for management of A fib. Deep tissue injury bilateral heels treated with betadine. OIC with fecal impaction managed with SMOG enema/mag citrate on 03/06 and bowel program has been augmented. He continues to require IV dilaudid for mobility--being weaned down to 0.5-0.75 mg tid prn.Damp to dry dressing changes ongoing to sacrum and rectal tube has been out for past week. Foley remains in place. Blood sugars better controlled on levemir. Therapy has been ongoing with patient showing ability to WB on BLE and stand for 30-45 seconds X 3 with 2-3 person assist. CIR recommended due to functional decline.He complains of pain at sacral pressure injury and shows pictures of its healing process.  Patient transferred to CIR on 08/06/2020 .     Met with pt and mom today to discuss TR services, leisure education, activity analysis/modifications. Pt presents with decreased activity tolerance, decreased functional mobility,  decreased balance Limiting pt's independence with leisure/community pursuits.  Plan  Min 1 TR session >20 minutes per week during LOS  Recommendations for other services: None   Discharge Criteria: Patient will be discharged from TR if patient refuses treatment 3  consecutive times without medical reason.  If treatment goals not met, if there is a change in medical status, if patient makes no progress towards goals or if patient is discharged from hospital.  The above assessment, treatment plan, treatment alternatives and goals were discussed and mutually agreed upon: by patient  [place] 08/08/2020, 3:21 PM

## 2020-08-08 NOTE — Progress Notes (Signed)
Physical Therapy Session Note  Patient Details gDate of Birth: [birth year]  Today's Date: 08/08/2020 PT Individual Time: 0915-1000 and 1330-1400 PT Individual Time Calculation (min): 45 min and 30 min  Short Term Goals: Week 1:  PT Short Term Goal 1 (Week 1): Pt will tolerate sitting EOB with supervision x 10 minutes while completing a functional task PT Short Term Goal 2 (Week 1): Pt will perform STS with min A x 2 consistently PT Short Term Goal 3 (Week 1): Pt will initiate gait training.  Skilled Therapeutic Interventions/Progress Updates:  Pt initially w/IV team nurse, session delayed by 15 min.    Pain:  Pt reports post low back pain w/activity.  Treatment to tolerance.  Rest breaks, bed incline reduced, and repositioning as needed.  Pt initially supine and agreeable to treatment session w/focus on global strengthening.   Using Kreg Bed features, pt secured w/3 blue straps waist/above knees/below knees.  Care taken w/application and removal due to fragile skin.Sides reduced in width in prep for semistand.  Gradually elevated stand feature in 10-15* increments to 50* with no dizzyness reported at any setting. At 50* performed: Mini squats x 10 Quad sets x10 w/5 sec hold Abdominal crunches x 10 Cross body reach for core strength x 5 each.    Pt fatigued, returned flat for rest break x 5-6 min. Returned to 40* and repeated therex as above second set each. Bed returned to flat, straps removed, all sides returned to bariatric width.  Pt repositioned for comfort. Pt left supine, repositioned for comfort, left w/rails up and all needs in reach.  PM session: Pain:  Pt reports 8/10 pain in buttocks w/seated activity.  Treatment to tolerance.  Rest breaks and repositioning as needed. Limited time in sitting.  Pt initially supine and agreeable to co-treatment session w/RT w/focus on functional mobility and general strengthening.  Pt very adept at operation of Kreg bed and facilitates set up  verbally w/therapist. Supine to sit w/rails, hob elevated, supervision.  Pt maintains sitting balance w/supervision while engaging in the following:  In sitting performed trunk rotation w/5lb ball 2 x15 Overhead reach w/ball 2 x10 Forward press + scapular retraction 2 x12 Biceps curl 2lb bar + 6lb wt 2x 20 overheqad press same wt as above 2x15  Pt requires approx 2 min rest break between sets/exercises for recovery due to strength and cardiovascular challenge.  Sit to supine mod I using rails. Instructs pt to facilitate returning bed to bariatric width.   Pt left supine w/rails up x 4, alarm set, bed in lowest position, and needs in reach. Therapy Documentation Precautions:  Precautions Precautions: Fall Precaution Comments: foley catheter, large buttocks wound, R heel wound Restrictions Weight Bearing Restrictions: No   Therapy/Group: Individual Therapy  Rada Hay, PT   Shearon Balo 08/08/2020, 3:34 PM

## 2020-08-08 NOTE — Progress Notes (Signed)
Occupational Therapy Session Note  Patient Details  Name: SAKIB NOGUEZ MRN: [id number] Date of Birth: 1971-03-19  Today's Date: 08/08/2020 OT Individual Time: 1000-1055 OT Individual Time Calculation (min): 55 min    Short Term Goals: Week 1:  OT Short Term Goal 1 (Week 1): Patient will tolerate sitting EOB for 15 minutes in preparation for BADL task OT Short Term Goal 2 (Week 1): Patient will complete sit<>stand with max A +2 in prep for transfer OT Short Term Goal 3 (Week 1): Patient will complete 50% of U Bbathing task while seated EOB.  Skilled Therapeutic Interventions/Progress Updates:    Pt resting in bed upon arrival and agreeable to sitting EOB for UB bathing/dressing and grooming tasks (shave with electric razor/trimmer). Supine>sit EOB with supervision. Pt directs care regarding bed setup. Bari Chair placed in front of pt for BLE/knee support and to place wash basin. Pt able to complete all tasks seated EOB for approx 35 mins. Pt commented that his hands/arms were "starting to get weak and shaky" near the end of time. Sit>supine with supervision. Pt able to reposition in bed without assistance. Pain as noted below. Pt completed all tasks with supervision after setup. Pt remained in bed with all needs within reach.   Therapy Documentation Precautions:  Precautions Precautions: Fall Precaution Comments: foley catheter, large buttocks wound, R heel wound Restrictions Weight Bearing Restrictions: No  Pain: Pt c/o low back pain 5/10 seated unsupported and buttocks pain of 7/10 increasing to 9/10 after sitting approx 35 mins; repositioned  Therapy/Group: Individual Therapy  [name] 08/08/2020, 10:58 AM

## 2020-08-08 NOTE — Progress Notes (Signed)
ANTICOAGULATION CONSULT NOTE  Pharmacy Consult for warfarin>Xarelto Indication: atrial fibrillation  Allergies  Allergen Reactions  . Bee Venom Anaphylaxis    Other reaction(s): Unknown Other reaction(s): Unknown Other reaction(s): Unknown   . Sglt2 Inhibitors Other (See Comments)    Necrotizing infection of the perineum  . Other Hives    Patient Measurements: Height: 5\' 10"  (177.8 cm) Weight:  (unable to get weight. Bed not zeroed) IBW/kg (Calculated) : 73  Vital Signs: Temp: 97.9 F (36.6 C) (03/10 0507) BP: 146/96 (03/10 0507) Pulse Rate: 93 (03/10 0507)  Labs: Recent Labs    08/07/20 0620 08/08/20 0523  HGB 9.0*  --   HCT 30.0*  --   PLT 294  --   LABPROT 29.6* 27.0*  INR 2.9* 2.6*  CREATININE 0.86 0.96    Estimated Creatinine Clearance: 189.4 mL/min (by C-G formula based on SCr of 0.96 mg/dL).   Medications:  Scheduled:  . amiodarone  200 mg Oral Daily  . amoxicillin-clavulanate  1 tablet Oral Q8H  . atorvastatin  20 mg Oral QPM  . chlorhexidine  15 mL Mouth Rinse BID  . Chlorhexidine Gluconate Cloth  6 each Topical Daily  . diltiazem  240 mg Oral Daily  . docusate sodium  100 mg Oral Daily  . insulin aspart  0-20 Units Subcutaneous TID WC  . insulin aspart  0-5 Units Subcutaneous QHS  . insulin aspart  18 Units Subcutaneous TID WC  . insulin detemir  42 Units Subcutaneous BID  . levothyroxine  75 mcg Oral Daily  . mouth rinse  15 mL Mouth Rinse q12n4p  . multivitamin with minerals  1 tablet Oral Daily  . nortriptyline  50 mg Oral BID  . oxyCODONE  20 mg Oral Q12H  . pantoprazole  40 mg Oral Daily  . polyethylene glycol  17 g Oral Daily  . pregabalin  300 mg Oral BID  . Ensure Max Protein  11 oz Oral TID WC & HS  . saccharomyces boulardii  250 mg Oral BID    Assessment: 50 yo M with prolonged inpatient admission from 1/10 >> 3/7 with necrotizing soft tissue infection requiring multiple I&Ds and ongoing wound care.  Patient was transferred to  inpatient rehab on 3/8.  Prior to admission, patient was on Xarelto for hx PAF.  Pt presented with AKI (now resolved).  Given his obesity and AKI, the decision was made to switch the patient to warfarin for anticoagulation.  Unfortunately, the patient does not have an easy way to get out of the home for INR checks, MD changing anticoagulant back to Xarelto. Hgb stable on 3/9.  3/10: INR 2.6  Goal of Therapy:  INR 2-3 Monitor platelets by anticoagulation protocol: Yes   Plan:  Follow up daily INR Start Xarelto when INR < 2    Thank you for allowing 5/10 to participate in this patients care. [place], PharmD 08/08/2020 8:43 AM  Please check [email or web address] for unit-specific pharmacy phone numbers.

## 2020-08-08 NOTE — IPOC Note (Signed)
Overall Plan of Care [facility]) Patient Details Name: Carl Hill MRN: [id number] DOB: [birth year]  Admitting Diagnosis: Debility  Hospital Problems: Principal Problem:   Debility     Functional Problem List: Nursing Bladder,Bowel,Edema,Endurance,Nutrition,Pain,Skin Integrity,Medication Management  PT Balance,Endurance,Motor,Pain,Sensory,Skin Integrity  OT Balance,Endurance,Motor,Nutrition,Pain,Sensory,Skin Integrity  SLP    TR         Basic ADL's: OT Eating,Grooming,Bathing,Dressing,Toileting     Advanced  ADL's: OT       Transfers: PT Bed Mobility,Bed to Chair,Car,Furniture,Floor  OT Toilet     Locomotion: PT Ambulation,Wheelchair Mobility,Stairs     Additional Impairments: OT Fuctional Use of Upper Extremity  SLP        TR      Anticipated Outcomes Item Anticipated Outcome  Self Feeding Modified Independent  Swallowing      Basic self-care  Supervision/min A  Toileting  Min A   Bathroom Transfers Supervision/min A  Bowel/Bladder  continent of bowel with normal pattern  Trial void after foley discontinued by provider  Transfers  min a  Locomotion  min A short distance gait with LRAD  Communication     Cognition     Pain  3/10  Safety/Judgment  Will be free of injury or  harm while in CIR   Therapy Plan: PT Intensity: Minimum of 1-2 x/day ,45 to 90 minutes PT Frequency: 5 out of 7 days PT Duration Estimated Length of Stay: 28-30 days OT Intensity: Minimum of 1-2 x/day, 45 to 90 minutes OT Frequency: 5 out of 7 days OT Duration/Estimated Length of Stay: 4 weeks     Due to the current state of emergency, patients may not be receiving their 3-hours of Medicare-mandated therapy.   Team Interventions: Nursing Interventions Patient/Family Education,Bladder Management,Bowel Management,Disease Management/Prevention,Pain Management,Medication Management,Skin Care/Wound Engineer, technical sales Support  PT interventions  Ambulation/gait training,Community reintegration,Discharge planning,Disease management/prevention,DME/adaptive equipment instruction,Functional mobility training,Neuromuscular re-education,Pain management,Patient/family education,Psychosocial support,Skin care/wound Insurance risk surveyor training,UE/LE Coordination activities,UE/LE Education officer, community  OT Interventions Balance/vestibular training,Community reintegration,Discharge planning,Disease Psychologist, prison and probation services stimulation,Functional mobility training,Neuromuscular re-education,Patient/family education,Pain management,Self Care/advanced ADL retraining,Psychosocial support,Splinting/orthotics,Skin care/wound managment,Therapeutic Activities,Therapeutic Exercise,UE/LE Coordination activities,Visual/perceptual remediation/compensation,Wheelchair propulsion/positioning,UE/LE Strength taining/ROM  SLP Interventions    TR Interventions    SW/CM Interventions Discharge Planning,Psychosocial Support,Patient/Family Education   Barriers to Discharge MD  Medical stability, Home enviroment access/loayout, Incontinence, Wound care, Lack of/limited family support, Weight and sacral wound care, as well as BMI of 77  Nursing Incontinence,Wound Care    PT Decreased caregiver support,Inaccessible home environment,Lack of/limited family support,Weight    OT      SLP      SW Decreased caregiver support,Lack of/limited family support     Team Discharge Planning: Destination: PT-Home ,OT- Home , SLP-  Projected Follow-up: PT-Home health PT, OT-  Home health OT, SLP-  Projected Equipment Needs: PT-To be determined, OT- To be determined, SLP-  Equipment Details: PT- , OT-  Patient/family involved in discharge planning: PT- Patient,  OT-Patient, SLP-   MD ELOS: 3-4 weeks- d/c 4/5 Medical Rehab Prognosis:  Fair Assessment: Pt is a 50  yr old male with hx of Afib on xarelto- switched back from coumadin, s/p sepsis due to necrotizing fasciitis, and pain issues, DM on new insulin; and hypokalemia as well as bowel inconitnence- has foley- to be removed Monday-   D/c goals min A     See Team Conference Notes for weekly updates to the plan of care

## 2020-08-09 LAB — PROTIME-INR
INR: 2.2 — ABNORMAL HIGH (ref 0.8–1.2)
INR: 2 — ABNORMAL HIGH (ref 0.8–1.2)
Prothrombin Time: 23.5 seconds — ABNORMAL HIGH (ref 11.4–15.2)
Prothrombin Time: 21.9 seconds — ABNORMAL HIGH (ref 11.4–15.2)

## 2020-08-09 LAB — GLUCOSE, CAPILLARY
Glucose-Capillary: 97 mg/dL (ref 70–99)
Glucose-Capillary: 123 mg/dL — ABNORMAL HIGH (ref 70–99)
Glucose-Capillary: 119 mg/dL — ABNORMAL HIGH (ref 70–99)
Glucose-Capillary: 103 mg/dL — ABNORMAL HIGH (ref 70–99)

## 2020-08-09 MED ORDER — HYDROMORPHONE HCL 1 MG/ML PO LIQD
1.5000 mg | Freq: Four times a day (QID) | ORAL | Status: DC | PRN
  Administered 2020-08-10 – 2020-09-05 (×55): 1.5 mg via ORAL
  Filled 2020-08-09 (×64): qty 2

## 2020-08-09 NOTE — Progress Notes (Signed)
PROGRESS NOTE   Subjective/Complaints:  Per pt, had to get IV for IV pain meds- was placed- but now there's a note from an RN, that IV pain meds changed to PO. Not sure why for wound care changes? PT going well Slept well- BMs are incontinent-  Per WOC nursing note- pt has a free floating rectum and doesn't have pressure injury-  has a post op wound- 27x16 cm 80% red, 10% slough, tan-red drainage.   ROS:  Pt denies SOB, abd pain, CP, N/V/C/D, and vision changes   Objective:   No results found. Recent Labs    08/07/20 0620  WBC 5.5  HGB 9.0*  HCT 30.0*  PLT 294   Recent Labs    08/07/20 0620 08/08/20 0523  NA 136 138  K 3.2* 3.9  CL 96* 98  CO2 30 32  GLUCOSE 86 109*  BUN 5* 6  CREATININE 0.86 0.96  CALCIUM 8.1* 8.4*    Intake/Output Summary (Last 24 hours) at 08/09/2020 1831 Last data filed at 08/09/2020 1643 Gross per 24 hour  Intake 118 ml  Output 7825 ml  Net -7707 ml     Pressure Injury 08/06/20 Heel Right Stage 2 -  Partial thickness loss of dermis presenting as a shallow open injury with a red, pink wound bed without slough. (Active)  08/06/20 0240  Location: Heel  Location Orientation: Right  Staging: Stage 2 -  Partial thickness loss of dermis presenting as a shallow open injury with a red, pink wound bed without slough.  Wound Description (Comments):   Present on Admission: Yes    Physical Exam: Vital Signs Blood pressure 107/60, pulse (!) 103, temperature 98.3 F (36.8 C), resp. rate 16, height 5\' 10"  (1.778 m), weight (!) 239.6 kg, SpO2 96 %.  Physical Exam     General: awake, alert, appropriate, in bariatric low air loss bed; appropriate; BMI 77+ NAD HENT: conjugate gaze; oropharynx moist CV: regular rate and rhythm this AM; no JVD Pulmonary: CTA B/L; no W/R/R- good air movement GI: soft, NT, ND, (+)BS- protuberant Psychiatric: appropriate Neurological: Ox3  Skin: Sacrum wound  C/D/I- cannot assess today- unable to turn pt without staff assistance L 2nd toe has dried blood and dressing on it- a little worse than yesterday however, now dried as well.   Musculoskeletal:  5/5 strength in bilateral upper extremities, 4/5 in lower extremities. Decreased sensation in bilateral hands and feet.     Assessment/Plan: 1. Functional deficits which require 3+ hours per day of interdisciplinary therapy in a comprehensive inpatient rehab setting.  Physiatrist is providing close team supervision and 24 hour management of active medical problems listed below.  Physiatrist and rehab team continue to assess barriers to discharge/monitor patient progress toward functional and medical goals  Care Tool:  Bathing    Body parts bathed by patient: Right arm,Left arm,Chest,Abdomen,Face,Right upper leg,Left upper leg   Body parts bathed by helper: Front perineal area,Buttocks,Right lower leg,Left lower leg     Bathing assist Assist Level: Maximal Assistance - Patient 24 - 49%     Upper Body Dressing/Undressing Upper body dressing   What is the patient wearing?: Pull over shirt  Upper body assist Assist Level: Supervision/Verbal cueing    Lower Body Dressing/Undressing Lower body dressing      What is the patient wearing?: Incontinence brief     Lower body assist Assist for lower body dressing: Maximal Assistance - Patient 25 - 49%     Toileting Toileting    Toileting assist Assist for toileting: Dependent - Patient 0%     Transfers Chair/bed transfer  Transfers assist  Chair/bed transfer activity did not occur: Safety/medical concerns        Locomotion Ambulation   Ambulation assist   Ambulation activity did not occur: Safety/medical concerns          Walk 10 feet activity   Assist  Walk 10 feet activity did not occur: Safety/medical concerns        Walk 50 feet activity   Assist Walk 50 feet with 2 turns activity did not occur:  Safety/medical concerns         Walk 150 feet activity   Assist Walk 150 feet activity did not occur: Safety/medical concerns         Walk 10 feet on uneven surface  activity   Assist Walk 10 feet on uneven surfaces activity did not occur: Safety/medical concerns         Wheelchair     Assist Will patient use wheelchair at discharge?: Yes Type of Wheelchair: Manual Wheelchair activity did not occur: Safety/medical concerns         Wheelchair 50 feet with 2 turns activity    Assist    Wheelchair 50 feet with 2 turns activity did not occur: Safety/medical concerns       Wheelchair 150 feet activity     Assist  Wheelchair 150 feet activity did not occur: Safety/medical concerns       Blood pressure 107/60, pulse (!) 103, temperature 98.3 F (36.8 C), resp. rate 16, height 5\' 10"  (1.778 m), weight (!) 239.6 kg, SpO2 96 %.  Medical Problem List and Plan: 1.  Debility secondary to septic shock from necrotizing soft tissue infection of perianal, gluteal, and perineal tissues.              -patient may shower but wound must be covered             -ELOS/Goals: modA (to be provided by father) in 2-3 weeks.   3/8- set d/c date at 4/5 at latest as d/c date at team conference today-  3/10- stood x1 with therapy and a LOT of help.   2.  Antithrombotics: -DVT/anticoagulation:  Pharmaceutical: Coumadin--was on Xarelto 20 mg daily-->Resume as AKI has resolved?  3/8- AKI has resolved, so restarted Xarelto and stopped Coumadin- esp since finger was bleeding so continuous today.  3/9- Pharmacy concerned about pt stopping coumadin- but explained he doesn't have easy way to get out of the home to get Coumadin checked at home- so would prefer he would be on Xarelto- changing- they said holding til he gets to INR <2.0-  3/10- pt wants Xarelto- was on at home- doesn't want coumadin             -antiplatelet therapy: N/A 3. Pain Management:  Continue Lyrica and elavil  for neuropathy.              -- Decrease IV Dilaudid to 0.5mg  BID PRN with dressing changes             --Oxycontin 15 mg bid with 10 mg prn for breakthrough pain.  3/8- will make sure has Dilaudid for dressing changes, and see them done Thursday-Since also c/o muscle spasms, will add Robaxin 500 mg QID prn for muscle spasms.    3/9- pain "tolerable"- taking muscle relaxers for muscle tightness  3/10- will get pt IV for IV pain meds for dressing changes, not therapy  3/11- per RN note, IV Dilaudid was stopped today- con't PO 4. Mood: LCSW to follow for evaluation and support.              -antipsychotic agents: N/A 5. Neuropsych: This patient is capable of making decisions on his own behalf. 6. Skin/Wound Care: Air mattress for pressure relief measures.              --will need local care frequently to keep sacral wound bed clean.             --Continue foley for now.  3/8- cannot have flexiseal since in Rehab- will d/w nursing  3/9- incontinent of stool- LBM last night- con't regimen   3/10- con't sacral wound care daily and prn  3/11- con't low air loss mattress and WOC note in chart- isn't pressure- is post op wound 7. Fluids/Electrolytes/Nutrition: Monitor I/O. Check CMET in am.  8. Necrotizing fascitis: Has been on augment since 02/11--> to continue for 6 months per ID 9. T2DM with neuropathy: Hgb A1c- 9.6 (down from 13 in Aug) Will continue to monitor BS ac/hs and use SSI for elevated BS             --used Farxiga, metformin and Victoza 1.8.             --Continue Levemir bid and titrate as indicated  3/20- pt reports wants to continue insulin  And current regimen, since BGs at home were 200s; here are 80s-low 100s- much happier- will con't regimen  3/11- 97 to 123- con't regimen 10 Hyponatremia: Likely due to multiple BM yesterday.              --Will recheck in am.  11. Hypokalemia:  Repeat K+ tomorrow.   3/8- K+ 3.6- doing better- con't regimen  3/9- K+ 3.2 again today- will  replete with 40 mEq x2 and recheck tomorrow.  3/10- K+ is 3.9- wwill recheck Monday- if continues to drop, might need scheduled Kdur  12. Proximal A FIb: Monitor HR tid--continue Cardizem 240 mg and amiodarone for rate control.              --monitor for symptoms with increase in activity.  13. OIC: Finally had results yesterday--3 big BM             --Will discontinue colace to help bulk stools as now having loose stools.  3/8- decreased colace today- it wasn't stopped, to bulk up stools.   Decreased to daily, but might need to stop- con't miralax, due to being on chronic pain meds. 14. Chronic right heel ulcer: Continue to paint wound daily with iodine.  15. Morbid obesity, BMI 78.51: provide counseling. Will need bariatric equipment. 16. Trach site  3/9- healing- but leave open to air.  3/11- almost completely healed- scab coming off- and healthy tissue underneath. 17. Foley for urinary retention  3/10- will remove Monday - he refuses to remove now.    18. Disposition:  at baseline, patient received significant assistance from his mother, aged 55. He required her assistance to wipe stool after BM due to body habitus. He was able to ambulate to bathroom and back on his  own. Plan is for discharge home to father, who is a IT sales professionalfirefighter, and who can provide more physical support to patient. Goal is ModA level for ambulation within his room. Discussed with patient plan for approx 2-3 week stay to reach this goal. He is currently demonstrating improved standing tolerance. Fuller SongLaura AC discussed with father.    LOS: 3 days A FACE TO FACE EVALUATION WAS PERFORMED  Ridhi Hoffert 08/09/2020, 6:31 PM

## 2020-08-09 NOTE — Consult Note (Addendum)
WOC Nurse Consult Note: Reason for Consult: Re-consult requested for buttock wound.  Surgical team performed debridement in the OR 1/11 and is no longer following for assessment and plan of care. Previous WOC consult was performed 2/27. Pt has been receiving moist gauze dressings twice a day and has a mod amt drainage.  Assessed wound with Rehab PA at the bedside to discuss plan of care. Pt has a high BMI and is on a low airloss bariatric mattress.  Wound type: Full thickness post-op wound; this is NOT a pressure injury Measurement: 27X16 cm; in the deepest section there is a tunneling area at 12:00 o'clock to 9 cm.  Wound is approx 15% yellow, 85% red, mod amt tan drainage.  In the center of the wound, the rectum is "free floating" without any bridge of skin surrounding to support the structure, only open wound.  Pt is frequently incontinent of stool and it is difficult to keep the wound from becoming constantly soiled related to his anatomy. He previously had a Flexiseal in place but this was discontinued related to trauma to the rectum and thickening stool, according to the patient.   Dressing procedure/placement/frequency: Continue present plan of care; topical treatment orders provided for bedside nurse to perform as follows: Change dressings 3-4 times a day PRN when soiled with stool depending on drainage/to keep area clean as much as possible.  Wound care to buttock/perineal wound:  Cleanse with NS, pat dry. Fill tunneling areas, using swab to fill, and main wound bed with saline moistened roll gauze (Kerlix). Top with ABD and secure with Mepilex tape. Reviewed plan of care with pt and his mother at the bedside.  Please re-consult if further assistance is needed.  Thank-you,  Cammie Mcgee MSN, RN, CWOCN, [place], CNS [phone]

## 2020-08-09 NOTE — Progress Notes (Signed)
Occupational Therapy Session Note  Patient Details  Name: Carl Hill MRN: [id number] Date of Birth: January 12, 1971  Today's Date: 08/09/2020 OT Individual Time: 0815-0900 OT Individual Time Calculation (min): 45 min    Short Term Goals: Week 1:  OT Short Term Goal 1 (Week 1): Patient will tolerate sitting EOB for 15 minutes in preparation for BADL task OT Short Term Goal 2 (Week 1): Patient will complete sit<>stand with max A +2 in prep for transfer OT Short Term Goal 3 (Week 1): Patient will complete 50% of U Bbathing task while seated EOB.  Skilled Therapeutic Interventions/Progress Updates:    Pt resting in bed upon arrival. OT intervention with focus on BUE therex with 7# barbell-chest presses 4x15 with each UE and triceps extensions 4x8 with each UE. Rest breaks between each set. Discussed sitting EOB in later session to facilitate cutting hair. Pt remained in bed with all needs within reach. RN present to administer meds.  Therapy Documentation Precautions:  Precautions Precautions: Fall Precaution Comments: foley catheter, large buttocks wound, R heel wound Restrictions Weight Bearing Restrictions: No   Pain:  pt reports 5/10 pain in buttocks; repositioned    Therapy/Group: Individual Therapy  [name] 08/09/2020, 9:09 AM

## 2020-08-09 NOTE — Progress Notes (Signed)
Occupational Therapy Note  Patient Details  Name: JOEL COWIN MRN: [id number] Date of Birth: 03/12/1971  Today's Date: 08/09/2020 OT Missed Time: 75 Minutes Missed Time Reason: Pain;Nursing care  Pt missed 75 mins skilled OT services. Pt reports 10/10 pain in buttocks. RN reports that dressing needs to occur secondary to soiled bandages. Will attempt therapy later in the day.    Lavone Neri [facility] 08/09/2020, 11:10 AM

## 2020-08-09 NOTE — Progress Notes (Signed)
ANTICOAGULATION CONSULT NOTE  Pharmacy Consult for warfarin>Xarelto Indication: atrial fibrillation  Allergies  Allergen Reactions  . Bee Venom Anaphylaxis    Other reaction(s): Unknown Other reaction(s): Unknown Other reaction(s): Unknown   . Sglt2 Inhibitors Other (See Comments)    Necrotizing infection of the perineum  . Other Hives    Patient Measurements: Height: 5\' 10"  (177.8 cm) Weight:  (Unable to get weight. Zero bed when pt is up during day) IBW/kg (Calculated) : 73  Vital Signs: Temp: 97.7 F (36.5 C) (03/11 0505) Temp Source: Oral (03/11 0505) BP: 138/86 (03/11 0505) Pulse Rate: 92 (03/11 0505)  Labs: Recent Labs    08/07/20 0620 08/08/20 0523 08/09/20 0524  HGB 9.0*  --   --   HCT 30.0*  --   --   PLT 294  --   --   LABPROT 29.6* 27.0* 23.5*  INR 2.9* 2.6* 2.2*  CREATININE 0.86 0.96  --     Estimated Creatinine Clearance: 189.4 mL/min (by C-G formula based on SCr of 0.96 mg/dL).   Medications:  Scheduled:  . amiodarone  200 mg Oral Daily  . amoxicillin-clavulanate  1 tablet Oral Q8H  . atorvastatin  20 mg Oral QPM  . chlorhexidine  15 mL Mouth Rinse BID  . Chlorhexidine Gluconate Cloth  6 each Topical Daily  . diltiazem  240 mg Oral Daily  . docusate sodium  100 mg Oral Daily  . insulin aspart  0-20 Units Subcutaneous TID WC  . insulin aspart  0-5 Units Subcutaneous QHS  . insulin aspart  18 Units Subcutaneous TID WC  . insulin detemir  42 Units Subcutaneous BID  . levothyroxine  75 mcg Oral Daily  . mouth rinse  15 mL Mouth Rinse q12n4p  . multivitamin with minerals  1 tablet Oral Daily  . nortriptyline  50 mg Oral BID  . oxyCODONE  20 mg Oral Q12H  . pantoprazole  40 mg Oral Daily  . polyethylene glycol  17 g Oral Daily  . pregabalin  300 mg Oral BID  . Ensure Max Protein  11 oz Oral TID WC & HS  . saccharomyces boulardii  250 mg Oral BID    Assessment: 50 yo M with prolonged inpatient admission from 1/10 >> 3/7 with necrotizing  soft tissue infection requiring multiple I&Ds and ongoing wound care.  Patient was transferred to inpatient rehab on 3/8.  Prior to admission, patient was on Xarelto for hx PAF.  Pt presented with AKI (now resolved).  Given his obesity and AKI, the decision was made to switch the patient to warfarin for anticoagulation.  Unfortunately, the patient does not have an easy way to get out of the home for INR checks, MD changing anticoagulant back to Xarelto. Hgb stable on 3/9. INR steadily trending down.  3/11: INR 2.2  Goal of Therapy:  INR 2-3 Monitor platelets by anticoagulation protocol: Yes   Plan:  Follow up 1730 INR Start Xarelto when INR < 2    Thank you for allowing 5/11 to participate in this patients care. [place], PharmD 08/09/2020 8:13 AM  Please check [email or web address] for unit-specific pharmacy phone numbers.

## 2020-08-09 NOTE — Progress Notes (Signed)
Occupational Therapy Note  Patient Details  Name: KEIL PICKERING MRN: [id number] Date of Birth: [birth year]   Attempted to see patient to make up therapy time missed earlier in day. Wound Care RN and PA present and unable to see patient.    Lavone Neri [facility] 08/09/2020, 1:36 PM

## 2020-08-09 NOTE — Progress Notes (Signed)
Pt was offered HS snack since CBG was 101. Pt was educated but refused.

## 2020-08-09 NOTE — Progress Notes (Signed)
Physical Therapy Session Note  Patient Details  Name: Carl Hill MRN: [id number] Date of Birth: May 29, 1971  Today's Date: 08/09/2020 PT Individual Time: 4403-4742 PT Individual Time Calculation (min): 54 min   Short Term Goals: Week 1:  PT Short Term Goal 1 (Week 1): Pt will tolerate sitting EOB with supervision x 10 minutes while completing a functional task PT Short Term Goal 2 (Week 1): Pt will perform STS with min A x 2 consistently PT Short Term Goal 3 (Week 1): Pt will initiate gait training.  Skilled Therapeutic Interventions/Progress Updates: Pt presents supine in bed and agreeable to therapy.  Pt needs to stand to zero bed.  Pt transfers sup to sit w/ supervision and increased time after preparing bed for transfers.  Pt sat EOB w/o LOB.  Sheet around hips to assist w/ transfer sit to stand. Pt transferred to bariatric w/c using the padding for knee stabilization and 2 person assist.  Pt able to stand 20-30 seconds before requiring to sit.  Pt sat and required sit to supine.  Pt agreed, w/ encouragement, to transfer to sitting again.  Pt will lean forward onto seat of w/c to relieve pressure to wounds.  Pt performed overhead press, cross-body punches 2 x 10 w/ long rest breaks between trials leaning forward.  Pt transfers sit to supine w/ occasional assist to complete LEs onto bed.  Pt able to scoot self to [facility] using all 4 extremities and bars at [facility].  Bed returned to bariatric configuration w/ alarm on and all needs in reach.     Therapy Documentation Precautions:  Precautions Precautions: Fall Precaution Comments: foley catheter, large buttocks wound, R heel wound Restrictions Weight Bearing Restrictions: No General:   Vital Signs:   Pain: pt states 10/10 pain w/ sitting on wounds, 7/10 to low back w/ occasional spasms. Pain Assessment Pain Scale: 0-10 Pain Score: 10-Worst pain ever Pain Type: Acute pain Pain Location: Buttocks Pain Orientation:  Lower;Right;Left Pain Radiating Towards: back Pain Descriptors / Indicators: Aching Pain Frequency: Constant Pain Onset: On-going Pain Intervention(s): Medication (See eMAR) Mobility:   Locomotion :    Trunk/Postural Assessment :    Balance:   Exercises: General Exercises - Upper Extremity Shoulder Flexion: Strengthening;Both;10 reps Bar Weights/Barbell (Shoulder Flexion): Other (comment) Shoulder Extension: Strengthening;Both;10 reps;Bar weights/barbell Elbow Flexion: Strengthening;Both;10 reps;Bar weights/barbell Other Treatments:      Therapy/Group: Individual Therapy  [name] 08/09/2020, 10:12 AM

## 2020-08-09 NOTE — Progress Notes (Signed)
Occupational Therapy Session Note  Patient Details  Name: Carl Hill MRN: [id number] Date of Birth: [birth year]  Today's Date: 08/09/2020 OT Individual Time: 1308-6578 OT Individual Time Calculation (min): 34 min    Short Term Goals: Week 1:  OT Short Term Goal 1 (Week 1): Patient will tolerate sitting EOB for 15 minutes in preparation for BADL task OT Short Term Goal 2 (Week 1): Patient will complete sit<>stand with max A +2 in prep for transfer OT Short Term Goal 3 (Week 1): Patient will complete 50% of U Bbathing task while seated EOB.  Skilled Therapeutic Interventions/Progress Updates:    Pt in bed during session, politely declining to sit EOB secondary to completing sitting and standing earlier and now having increased left buttocks pain secondary to his wound.  He did agree to participate in UE strengthening tasks however with use of the green medium resistance therapy band.  One set of 15 reps were completed with each arm for the following exercises: shoulder flexion, shoulder extension, elbow flexion, elbow extension, and horizontal shoulder abduction.  Min instructional cueing was given for technique throughout exercises.  He was left in the bed positioned for comfort at end of session and was very thankful and complimentary to this therapist for working with him.  Call button and phone in reach.    Therapy Documentation Precautions:  Precautions Precautions: Fall Precaution Comments: foley catheter, large buttocks wound, R heel wound Restrictions Weight Bearing Restrictions: No  Pain: Pain Assessment Pain Scale: Faces Pain Score: 4  Faces Pain Scale: Hurts little more Pain Type: Acute pain Pain Location: Buttocks Pain Orientation: Left Pain Descriptors / Indicators: Discomfort Pain Frequency: Constant Pain Onset: On-going Patients Stated Pain Goal: 0 Pain Intervention(s): Repositioned;Emotional support Multiple Pain Sites: Yes ADL: See Care Tool Section for  some details of mobility and selfcare  Therapy/Group: Individual Therapy  Beatrix Breece OTR/L 08/09/2020, 4:39 PM

## 2020-08-09 NOTE — Progress Notes (Signed)
Per RN pain med will be changed to po. PIV not needed at this time.

## 2020-08-10 DIAGNOSIS — M533 Sacrococcygeal disorders, not elsewhere classified: Secondary | ICD-10-CM

## 2020-08-10 DIAGNOSIS — R7309 Other abnormal glucose: Secondary | ICD-10-CM

## 2020-08-10 DIAGNOSIS — R339 Retention of urine, unspecified: Secondary | ICD-10-CM

## 2020-08-10 DIAGNOSIS — E1142 Type 2 diabetes mellitus with diabetic polyneuropathy: Secondary | ICD-10-CM | POA: Diagnosis present

## 2020-08-10 DIAGNOSIS — E876 Hypokalemia: Secondary | ICD-10-CM | POA: Diagnosis present

## 2020-08-10 LAB — GLUCOSE, CAPILLARY
Glucose-Capillary: 178 mg/dL — ABNORMAL HIGH (ref 70–99)
Glucose-Capillary: 111 mg/dL — ABNORMAL HIGH (ref 70–99)
Glucose-Capillary: 104 mg/dL — ABNORMAL HIGH (ref 70–99)
Glucose-Capillary: 111 mg/dL — ABNORMAL HIGH (ref 70–99)

## 2020-08-10 LAB — PROTIME-INR
INR: 1.7 — ABNORMAL HIGH (ref 0.8–1.2)
Prothrombin Time: 19.4 seconds — ABNORMAL HIGH (ref 11.4–15.2)

## 2020-08-10 MED ORDER — RIVAROXABAN 20 MG PO TABS
20.0000 mg | ORAL_TABLET | Freq: Every day | ORAL | Status: DC
  Administered 2020-08-10 – 2020-09-11 (×32): 20 mg via ORAL
  Filled 2020-08-10 (×33): qty 1

## 2020-08-10 NOTE — Progress Notes (Signed)
PROGRESS NOTE   Subjective/Complaints: Patient seen laying on his side in bed this morning eating breakfast.  He states he slept well overnight.  ROS: Denies CP, SOB, N/V/D  Objective:   No results found. No results for input(s): WBC, HGB, HCT, PLT in the last 72 hours. Recent Labs    08/08/20 0523  NA 138  K 3.9  CL 98  CO2 32  GLUCOSE 109*  BUN 6  CREATININE 0.96  CALCIUM 8.4*    Intake/Output Summary (Last 24 hours) at 08/10/2020 1143 Last data filed at 08/10/2020 0650 Gross per 24 hour  Intake 480 ml  Output 6225 ml  Net -5745 ml     Pressure Injury 08/06/20 Heel Right Stage 2 -  Partial thickness loss of dermis presenting as a shallow open injury with a red, pink wound bed without slough. (Active)  08/06/20 0240  Location: Heel  Location Orientation: Right  Staging: Stage 2 -  Partial thickness loss of dermis presenting as a shallow open injury with a red, pink wound bed without slough.  Wound Description (Comments):   Present on Admission: Yes    Physical Exam: Vital Signs Blood pressure 120/87, pulse (!) 105, temperature 98.2 F (36.8 C), resp. rate 20, height 5\' 10"  (1.778 m), weight (!) 239.6 kg, SpO2 91 %. Constitutional: No distress . Vital signs reviewed.  Super super obese. HENT: Normocephalic.  Atraumatic. Eyes: EOMI. No discharge. Cardiovascular: No JVD.  Irregularly irregular. Respiratory: Normal effort.  No stridor.  Bilateral clear to auscultation. GI: Non-distended.  BS +. Skin: Warm and dry.   Left 2nd toe with dried blood  Psych: Normal mood.  Normal behavior. Musc: No edema in extremities.  No tenderness in extremities. Neuro: Alert Musculoskeletal: Bilateral upper extremities: 5/5 throughout Bilateral lower extremities: Hip flexion 4+/5, distally 5/5   Assessment/Plan: 1. Functional deficits which require 3+ hours per day of interdisciplinary therapy in a comprehensive inpatient  rehab setting.  Physiatrist is providing close team supervision and 24 hour management of active medical problems listed below.  Physiatrist and rehab team continue to assess barriers to discharge/monitor patient progress toward functional and medical goals  Care Tool:  Bathing    Body parts bathed by patient: Right arm,Left arm,Chest,Abdomen,Face,Right upper leg,Left upper leg   Body parts bathed by helper: Front perineal area,Buttocks,Right lower leg,Left lower leg     Bathing assist Assist Level: Maximal Assistance - Patient 24 - 49%     Upper Body Dressing/Undressing Upper body dressing   What is the patient wearing?: Pull over shirt    Upper body assist Assist Level: Supervision/Verbal cueing    Lower Body Dressing/Undressing Lower body dressing      What is the patient wearing?: Incontinence brief     Lower body assist Assist for lower body dressing: Maximal Assistance - Patient 25 - 49%     Toileting Toileting    Toileting assist Assist for toileting: Dependent - Patient 0%     Transfers Chair/bed transfer  Transfers assist  Chair/bed transfer activity did not occur: Safety/medical concerns        Locomotion Ambulation   Ambulation assist   Ambulation activity did not occur: Safety/medical  concerns          Walk 10 feet activity   Assist  Walk 10 feet activity did not occur: Safety/medical concerns        Walk 50 feet activity   Assist Walk 50 feet with 2 turns activity did not occur: Safety/medical concerns         Walk 150 feet activity   Assist Walk 150 feet activity did not occur: Safety/medical concerns         Walk 10 feet on uneven surface  activity   Assist Walk 10 feet on uneven surfaces activity did not occur: Safety/medical concerns         Wheelchair     Assist Will patient use wheelchair at discharge?: Yes Type of Wheelchair: Manual Wheelchair activity did not occur: Safety/medical  concerns         Wheelchair 50 feet with 2 turns activity    Assist    Wheelchair 50 feet with 2 turns activity did not occur: Safety/medical concerns       Wheelchair 150 feet activity     Assist  Wheelchair 150 feet activity did not occur: Safety/medical concerns       Blood pressure 120/87, pulse (!) 105, temperature 98.2 F (36.8 C), resp. rate 20, height 5\' 10"  (1.778 m), weight (!) 239.6 kg, SpO2 91 %.  Medical Problem List and Plan: 1.  Debility secondary to septic shock from necrotizing soft tissue infection of perianal, gluteal, and perineal tissues.   Continue CIR 2.  Antithrombotics: -DVT/anticoagulation:  Pharmaceutical:   Xarelto             -antiplatelet therapy: N/A 3. Pain Management:  Continue Lyrica and elavil for neuropathy.              Oxycontin 15 mg bid with 10 mg prn for breakthrough pain.   Added Robaxin 500 mg QID prn for muscle spasms.    Patient on Dilaudid dressing changes  Appears controlled on 3/12 4. Mood: LCSW to follow for evaluation and support.              -antipsychotic agents: N/A 5. Neuropsych: This patient is capable of making decisions on his own behalf. 6. Skin/Wound Care: Air mattress for pressure relief measures.              --will need local care frequently to keep sacral wound bed clean.             --Continue foley for now.  3/10- con't sacral wound care daily and prn  Con't low air loss mattress and WOC note in chart- isn't pressure- is post op wound 7. Fluids/Electrolytes/Nutrition: Monitor I/Os.  8. Necrotizing fascitis: Has been on Augmentin since 02/11--> to continue for 6 months per ID 9. T2DM with neuropathy: Hgb A1c- 9.6 (down from 13 in Aug) Will continue to monitor BS ac/hs and use SSI for elevated BS             --used Farxiga, metformin and Victoza 1.8.             --Continue Levemir bid and titrate as indicated  Labile on 3/12, continue to monitor 10 Hyponatremia: Likely due to multiple BM  yesterday.              Sodium 138 on 3/10, continue to monitor 11. Hypokalemia:    Potassium 3.9 on 3/10 after supplementation 12. Proximal A FIb: Monitor HR tid--continue Cardizem 240 mg and amiodarone for rate control.              --  monitor for symptoms with increase in activity.  13. OIC:              Discontinued colace to help bulk stools as now having loose stools. 14. Chronic right heel ulcer: Continue to paint wound daily with iodine.  15.  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 Will need bariatric equipment. 16. Trach site  3/9- healing- but leave open to air.  3/11- almost completely healed- scab coming off- and healthy tissue underneath. 17. Foley for urinary retention  Plan to DC on Monday  18. Disposition:  at baseline, patient received significant assistance from his mother, aged 7. He required her assistance to wipe stool after BM due to body habitus. He was able to ambulate to bathroom and back on his own. Plan is for discharge home to father, who is a IT sales professional, and who can provide more physical support to patient. Goal is ModA level for ambulation within his room. Plan for approx 2-3 week stay to reach this goal. He is currently demonstrating improved standing tolerance. Fuller Song discussed with father.    LOS: 4 days A FACE TO FACE EVALUATION WAS PERFORMED  Misti Towle Karis Juba 08/10/2020, 11:43 AM

## 2020-08-10 NOTE — Progress Notes (Signed)
ANTICOAGULATION CONSULT NOTE  Pharmacy Consult for warfarin>Xarelto Indication: atrial fibrillation  Allergies  Allergen Reactions  . Bee Venom Anaphylaxis    Other reaction(s): Unknown Other reaction(s): Unknown Other reaction(s): Unknown   . Sglt2 Inhibitors Other (See Comments)    Necrotizing infection of the perineum  . Other Hives    Patient Measurements: Height: 5\' 10"  (177.8 cm) Weight: (!) 239.6 kg (528 lb 3.6 oz) IBW/kg (Calculated) : 73  Vital Signs: Temp: 98.2 F (36.8 C) (03/12 0459) BP: 120/87 (03/12 0459) Pulse Rate: 105 (03/12 0459)  Labs: Recent Labs    08/08/20 0523 08/09/20 0524 08/09/20 2023 08/10/20 0534  LABPROT 27.0* 23.5* 21.9* 19.4*  INR 2.6* 2.2* 2.0* 1.7*  CREATININE 0.96  --   --   --     Estimated Creatinine Clearance: 185.8 mL/min (by C-G formula based on SCr of 0.96 mg/dL).   Medications:  Scheduled:  . amiodarone  200 mg Oral Daily  . amoxicillin-clavulanate  1 tablet Oral Q8H  . atorvastatin  20 mg Oral QPM  . chlorhexidine  15 mL Mouth Rinse BID  . Chlorhexidine Gluconate Cloth  6 each Topical Daily  . diltiazem  240 mg Oral Daily  . docusate sodium  100 mg Oral Daily  . insulin aspart  0-20 Units Subcutaneous TID WC  . insulin aspart  0-5 Units Subcutaneous QHS  . insulin aspart  18 Units Subcutaneous TID WC  . insulin detemir  42 Units Subcutaneous BID  . levothyroxine  75 mcg Oral Daily  . mouth rinse  15 mL Mouth Rinse q12n4p  . multivitamin with minerals  1 tablet Oral Daily  . nortriptyline  50 mg Oral BID  . oxyCODONE  20 mg Oral Q12H  . pantoprazole  40 mg Oral Daily  . polyethylene glycol  17 g Oral Daily  . pregabalin  300 mg Oral BID  . Ensure Max Protein  11 oz Oral TID WC & HS  . saccharomyces boulardii  250 mg Oral BID    Assessment: 50 yo M with prolonged inpatient admission from 1/10 >> 3/7 with necrotizing soft tissue infection requiring multiple I&Ds and ongoing wound care.  Patient was transferred  to inpatient rehab on 3/8.  Prior to admission, patient was on Xarelto for hx PAF.  Pt presented with AKI (now resolved).  Given his obesity and AKI, the decision was made to switch the patient to warfarin for anticoagulation.  Unfortunately, the patient does not have an easy way to get out of the home for INR checks, MD changing anticoagulant back to Xarelto. Hgb stable on 3/9.  INR today is 1.7, appropriate to start Xarelto today given INR is now  < 2.  Goal of Therapy:  INR 2-3 Monitor platelets by anticoagulation protocol: Yes   Plan:  Start Xarelto 20 mg daily Monitor CBC, s/sx bleeding  5/9, PharmD PGY-1 Pharmacy Resident 08/10/2020 7:33 AM Please see [facility] for all pharmacy numbers

## 2020-08-11 LAB — GLUCOSE, CAPILLARY
Glucose-Capillary: 97 mg/dL (ref 70–99)
Glucose-Capillary: 124 mg/dL — ABNORMAL HIGH (ref 70–99)
Glucose-Capillary: 105 mg/dL — ABNORMAL HIGH (ref 70–99)
Glucose-Capillary: 129 mg/dL — ABNORMAL HIGH (ref 70–99)

## 2020-08-11 NOTE — Progress Notes (Signed)
Physical Therapy Session Note  Patient Details  Name: Carl Hill MRN: [id number] Date of Birth: 10-17-1970  Today's Date: 08/11/2020 PT Individual Time: 0900-1000, 3893-7342 PT Individual Time Calculation (min): 60 min, 41 min  Short Term Goals: Week 1:  PT Short Term Goal 1 (Week 1): Pt will tolerate sitting EOB with supervision x 10 minutes while completing a functional task PT Short Term Goal 2 (Week 1): Pt will perform STS with min A x 2 consistently PT Short Term Goal 3 (Week 1): Pt will initiate gait training.  Skilled Therapeutic Interventions/Progress Updates:    Session 1:  Pt received in L side lying and agreeable to therapy. Reports 8/10 pain but was premedicated for dressing change immediately prior to session. Pain managed with positioning and frequent rest breaks. Pt transferred to EOB with min A and use of bed features. STS x 3 with bari recliner positioned in front of pt to block knees, BUE support on recliner, and sling around bottom. Pt required min A from sling to power up to stand. Pt performed standing squats 2 x 5, to fatigue, with BUE support on recliner.Pt returned to supine with mod A for LE management and use of bed features.UE strengthening with green tband: bicep curl 2 x 20, horizontal abduction 2 x 20, UE dead bug with orange band 2 x 10. Pt returned to L side lying with supervision and remained in bed with all needs in reach.  Session 2: Pt received in L side lying and agreeable to therapy. Pt reports 7/10 pain in buttocks and R knee, which was addressed by frequent positioning and rest breaks. Pt transferred to EOB with min A and use of bed features. STS x 6 with bari recliner positioned in front of pt to block knees, BUE support on recliner, and sling around bottom. All standing activity was performed in this fashion. Pt required min A from sling to power up to stand and CGA at times. Pt performed weight shifts to fatigue x 3, reporting weakness R>L. Marches  3 x 10. Pt used chair set up to scoot laterally towards the head of bed for improved positioning . Pt returned to L side lying with min A and was left with all needs in reach.  Therapy Documentation Precautions:  Precautions Precautions: Fall Precaution Comments: foley catheter, large buttocks wound, R heel wound Restrictions Weight Bearing Restrictions: No General:    Therapy/Group: Individual Therapy  Dyane Dustman, SPT 08/11/2020, 12:26 PM

## 2020-08-11 NOTE — Progress Notes (Signed)
Occupational Therapy Session Note  Patient Details  Name: Carl Hill MRN: [id number] Date of Birth: Feb 29, 1971  Today's Date: 08/11/2020 OT Individual Time: 3846-6599 OT Individual Time Calculation (min): 73 min    Short Term Goals: Week 1:  OT Short Term Goal 1 (Week 1): Patient will tolerate sitting EOB for 15 minutes in preparation for BADL task OT Short Term Goal 2 (Week 1): Patient will complete sit<>stand with max A +2 in prep for transfer OT Short Term Goal 3 (Week 1): Patient will complete 50% of U Bbathing task while seated EOB.  Skilled Therapeutic Interventions/Progress Updates:    Pt resting in bed upon arrival. OT intervention with focus on bed mobility, sitting balance, UB bathing/dressing, and BUE strengthening to increase indepenence with BADLs. Supine<>sit EOB with supervision. Pt able to reposition self without assistance. Sitting balance with supervision. Pt tolerated sitting EOB for 40 mins while bathing/dressing and while his head was being shaved with electric clippers. Pt engaged in BUE therex with green theraband and demonstrated exercises learned on 3/11. Pt reminaed in bed with all needs within reach.   Therapy Documentation Precautions:  Precautions Precautions: Fall Precaution Comments: foley catheter, large buttocks wound, R heel wound Restrictions Weight Bearing Restrictions: No Pain:  Pt c/o 8/10 pain in buttocks when sitting EOB; RN notified and repositioned   Therapy/Group: Individual Therapy  [name] 08/11/2020, 8:14 AM

## 2020-08-12 LAB — BASIC METABOLIC PANEL
Anion gap: 8 (ref 5–15)
BUN: 9 mg/dL (ref 6–20)
CO2: 30 mmol/L (ref 22–32)
Calcium: 8.6 mg/dL — ABNORMAL LOW (ref 8.9–10.3)
Chloride: 102 mmol/L (ref 98–111)
Creatinine, Ser: 1.01 mg/dL (ref 0.61–1.24)
GFR, Estimated: >60 mL/min (ref >60)
Glucose, Bld: 104 mg/dL — ABNORMAL HIGH (ref 70–99)
Potassium: 4.1 mmol/L (ref 3.5–5.1)
Sodium: 140 mmol/L (ref 135–145)

## 2020-08-12 LAB — CBC WITH DIFFERENTIAL/PLATELET
Abs Immature Granulocytes: 0.05 10*3/uL (ref 0.00–0.07)
Basophils Absolute: 0.1 10*3/uL (ref 0.0–0.1)
Basophils Relative: 1 %
Eosinophils Absolute: 0.3 10*3/uL (ref 0.0–0.5)
Eosinophils Relative: 4 %
HCT: 32.1 % — ABNORMAL LOW (ref 39.0–52.0)
Hemoglobin: 9.4 g/dL — ABNORMAL LOW (ref 13.0–17.0)
Immature Granulocytes: 1 %
Lymphocytes Relative: 46 %
Lymphs Abs: 3.2 10*3/uL (ref 0.7–4.0)
MCH: 26 pg (ref 26.0–34.0)
MCHC: 29.3 g/dL — ABNORMAL LOW (ref 30.0–36.0)
MCV: 88.9 fL (ref 80.0–100.0)
Monocytes Absolute: 0.6 10*3/uL (ref 0.1–1.0)
Monocytes Relative: 9 %
Neutro Abs: 2.8 10*3/uL (ref 1.7–7.7)
Neutrophils Relative %: 39 %
Platelets: 326 10*3/uL (ref 150–400)
RBC: 3.61 MIL/uL — ABNORMAL LOW (ref 4.22–5.81)
RDW: 17.1 % — ABNORMAL HIGH (ref 11.5–15.5)
WBC: 7.1 10*3/uL (ref 4.0–10.5)
nRBC: 0 % (ref 0.0–0.2)

## 2020-08-12 LAB — GLUCOSE, CAPILLARY
Glucose-Capillary: 99 mg/dL (ref 70–99)
Glucose-Capillary: 83 mg/dL (ref 70–99)
Glucose-Capillary: 78 mg/dL (ref 70–99)
Glucose-Capillary: 104 mg/dL — ABNORMAL HIGH (ref 70–99)

## 2020-08-12 MED ORDER — CALCIUM POLYCARBOPHIL 625 MG PO TABS
625.0000 mg | ORAL_TABLET | Freq: Every day | ORAL | Status: DC
  Administered 2020-08-12 – 2020-09-10 (×27): 625 mg via ORAL
  Filled 2020-08-12 (×29): qty 1

## 2020-08-12 NOTE — Progress Notes (Signed)
PROGRESS NOTE   Subjective/Complaints:  Got haircut-  By OT Stood 7-10x- and also did squats and weight shifts x5 each.  R knee somewhat unstable L hip has a little scrape- not pressure.     ROS:  Pt denies SOB, abd pain, CP, N/V/C/D, and vision changes   Objective:   No results found. Recent Labs    08/12/20 0459  WBC 7.1  HGB 9.4*  HCT 32.1*  PLT 326   Recent Labs    08/12/20 0459  NA 140  K 4.1  CL 102  CO2 30  GLUCOSE 104*  BUN 9  CREATININE 1.01  CALCIUM 8.6*    Intake/Output Summary (Last 24 hours) at 08/12/2020 0827 Last data filed at 08/12/2020 0809 Gross per 24 hour  Intake 596 ml  Output 6500 ml  Net -5904 ml     Pressure Injury 08/06/20 Heel Right Stage 2 -  Partial thickness loss of dermis presenting as a shallow open injury with a red, pink wound bed without slough. (Active)  08/06/20 0240  Location: Heel  Location Orientation: Right  Staging: Stage 2 -  Partial thickness loss of dermis presenting as a shallow open injury with a red, pink wound bed without slough.  Wound Description (Comments):   Present on Admission: Yes    Physical Exam: Vital Signs Blood pressure 139/76, pulse 95, temperature 97.7 F (36.5 C), temperature source Oral, resp. rate 18, height 5\' 10"  (1.778 m), weight (!) 236.1 kg, SpO2 95 %.   General: awake, alert, appropriate,  Laying in bariatric low air loss bed, NAD HENT: conjugate gaze; oropharynx moist CV: regular rate and rhythm; no JVD Pulmonary: CTA B/L; no W/R/R- good air movement GI: soft, NT, ND, (+)BS- protuberant Psychiatric: appropriate, smiling Neurological: Ox3 Skin: Warm and dry. Has foley- also has extremely large post op wound seen in pictures as of this AM- red/pink- side granulating in edges, no slough seen  Left 2nd toe- clean- no dried blood- just a dressing  Musc: No edema in extremities.  No tenderness in extremities. Neuro:  Alert Musculoskeletal: Bilateral upper extremities: 5/5 throughout Bilateral lower extremities: Hip flexion 4+/5, distally 5/5   Assessment/Plan: 1. Functional deficits which require 3+ hours per day of interdisciplinary therapy in a comprehensive inpatient rehab setting.  Physiatrist is providing close team supervision and 24 hour management of active medical problems listed below.  Physiatrist and rehab team continue to assess barriers to discharge/monitor patient progress toward functional and medical goals  Care Tool:  Bathing    Body parts bathed by patient: Right arm,Left arm,Chest,Abdomen,Face,Right upper leg,Left upper leg   Body parts bathed by helper: Front perineal area,Buttocks,Right lower leg,Left lower leg     Bathing assist Assist Level: Maximal Assistance - Patient 24 - 49%     Upper Body Dressing/Undressing Upper body dressing   What is the patient wearing?: Pull over shirt    Upper body assist Assist Level: Supervision/Verbal cueing    Lower Body Dressing/Undressing Lower body dressing      What is the patient wearing?: Incontinence brief     Lower body assist Assist for lower body dressing: Maximal Assistance - Patient 25 - 49%  Toileting Toileting    Toileting assist Assist for toileting: Dependent - Patient 0%     Transfers Chair/bed transfer  Transfers assist  Chair/bed transfer activity did not occur: Safety/medical concerns        Locomotion Ambulation   Ambulation assist   Ambulation activity did not occur: Safety/medical concerns          Walk 10 feet activity   Assist  Walk 10 feet activity did not occur: Safety/medical concerns        Walk 50 feet activity   Assist Walk 50 feet with 2 turns activity did not occur: Safety/medical concerns         Walk 150 feet activity   Assist Walk 150 feet activity did not occur: Safety/medical concerns         Walk 10 feet on uneven surface   activity   Assist Walk 10 feet on uneven surfaces activity did not occur: Safety/medical concerns         Wheelchair     Assist Will patient use wheelchair at discharge?: Yes Type of Wheelchair: Manual Wheelchair activity did not occur: Safety/medical concerns         Wheelchair 50 feet with 2 turns activity    Assist    Wheelchair 50 feet with 2 turns activity did not occur: Safety/medical concerns       Wheelchair 150 feet activity     Assist  Wheelchair 150 feet activity did not occur: Safety/medical concerns       Blood pressure 139/76, pulse 95, temperature 97.7 F (36.5 C), temperature source Oral, resp. rate 18, height 5\' 10"  (1.778 m), weight (!) 236.1 kg, SpO2 95 %.  Medical Problem List and Plan: 1.  Debility secondary to septic shock from necrotizing soft tissue infection of perianal, gluteal, and perineal tissues.   Continue CIR 2.  Antithrombotics: -DVT/anticoagulation:  Pharmaceutical:   Xarelto             -antiplatelet therapy: N/A 3. Pain Management:  Continue Lyrica and elavil for neuropathy.              Oxycontin 15 mg bid with 10 mg prn for breakthrough pain.   Added Robaxin 500 mg QID prn for muscle spasms.    3/14- no IV anymore- doing ok with PO meds for dressing changes- con't regimen 4. Mood: LCSW to follow for evaluation and support.              -antipsychotic agents: N/A 5. Neuropsych: This patient is capable of making decisions on his own behalf. 6. Skin/Wound Care: Air mattress for pressure relief measures.              --will need local care frequently to keep sacral wound bed clean.             --Continue foley for now.  3/10- con't sacral wound care daily and prn  Con't low air loss mattress and WOC note in chart- isn't pressure- is post op wound  3/14- wound looks better- edges closing in by granulation and no slough con't wound care and foley due to high risk of MASD 7. Fluids/Electrolytes/Nutrition: Monitor  I/Os.  8. Necrotizing fascitis: Has been on Augmentin since 02/11--> to continue for 6 months per ID 9. T2DM with neuropathy: Hgb A1c- 9.6 (down from 13 in Aug) Will continue to monitor BS ac/hs and use SSI for elevated BS             --used Farxiga, metformin  and Victoza 1.8.             --Continue Levemir bid and titrate as indicated  3/14- 99-129- well controlled- con't regimen 10 Hyponatremia: Likely due to multiple BM yesterday.              Sodium 138 on 3/10, continue to monitor 11. Hypokalemia:    Potassium 3.9 on 3/10 after supplementation 12. Proximal A FIb: Monitor HR tid--continue Cardizem 240 mg and amiodarone for rate control.              --monitor for symptoms with increase in activity.  13. OIC:              Discontinued colace to help bulk stools as now having loose stools. 14. Chronic right heel ulcer: Continue to paint wound daily with iodine.  15.  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 Will need bariatric equipment.  3/14- BMI down to 74.68 16. Trach site  3/9- healing- but leave open to air.  3/11- almost completely healed- scab coming off- and healthy tissue underneath. 17. Foley for urinary retention  Plan to DC on Monday  3/14-  Will keep Foley- got ok by head nurse, because he has such vast post op wounds from necrotizing fasciitis- con't for now 18. Loose stools  3/14- rectum free floating- will d/c Miralax and add Fibercon 19. Disposition:  at baseline, patient received significant assistance from his mother, aged 39. He required her assistance to wipe stool after BM due to body habitus. He was able to ambulate to bathroom and back on his own. Plan is for discharge home to father, who is a IT sales professional, and who can provide more physical support to patient. Goal is ModA level for ambulation within his room. Plan for approx 2-3 week stay to reach this goal. He is currently demonstrating improved standing tolerance. Fuller Song discussed with father.     LOS: 6 days A FACE TO FACE EVALUATION WAS PERFORMED  Dixie Coppa 08/12/2020, 8:27 AM

## 2020-08-12 NOTE — Progress Notes (Signed)
Physical Therapy Session Note  Patient Details  Name: Carl Hill MRN: [id number] Date of Birth: 21-Sep-1970  Today's Date: 08/12/2020 PT Individual Time: 0800-0900, 1300-1415 PT Individual Time Calculation (min): 60 min, 75 min  Short Term Goals: Week 1:  PT Short Term Goal 1 (Week 1): Pt will tolerate sitting EOB with supervision x 10 minutes while completing a functional task PT Short Term Goal 2 (Week 1): Pt will perform STS with min A x 2 consistently PT Short Term Goal 3 (Week 1): Pt will initiate gait training.  Skilled Therapeutic Interventions/Progress Updates:    Session 1: Pt received side lying in bed and agreeable to therapy. Pt transitions to EOB with supervision and use of bed features. Pt was able to stand without assist by pulling up to bari recliner positioned to block knees. While standing with UE support and knees blocked by bari recliner, pt performed 3 x 5 squats, marches, and L side stepping. Side stepping required assist x 3 to manage bari recliner while unlocked. Pt became fatigued and was not able to stand again in this session. Pt performed seated reaches x 10 and overhead press x 10, 2 x 10 with mini soda cans. Pt returned to bed after session with min A and was left with all needs in reach.  Session 2: Pt in bed on arrival and agreeable to therapy. Kreg bed standing function was used to assess orthostatics due to complaint of dizziness earlier in the day.  BP was recorded as follows: Supine- 142/83 15 deg- 124/83 30 deg- 104/ 75 45 deg- 108/86 EOB 113/89   Pt had no c/o dizziness while using standing function up to 60 deg. Pt had difficulty preventing his knees from buckling in standing at 60 deg. Supine<>sitting EOB with supervision and use of bed features. Pt performed STS x 6 with bari recliner for UE support and to block knees. Performed seated D2 flexion with orange tband, 2 x 10 before returning to sidelying with supervision with all needs in reach at  end of session.  Therapy Documentation Precautions:  Precautions Precautions: Fall Precaution Comments: foley catheter, large buttocks wound, R heel wound Restrictions Weight Bearing Restrictions: No General:    Therapy/Group: Individual Therapy  Dyane Dustman, SPT 08/12/2020, 12:14 PM

## 2020-08-12 NOTE — Progress Notes (Signed)
Occupational Therapy Session Note  Patient Details  Name: Carl Hill MRN: [id number] Date of Birth: [birth year]  Today's Date: 08/12/2020 OT Individual Time: 1638-4536 OT Individual Time Calculation (min): 18 min  and Today's Date: 08/12/2020 OT Missed Time: 42 Minutes Missed Time Reason: Patient fatigue;Other (comment)   Short Term Goals: Week 1:  OT Short Term Goal 1 (Week 1): Patient will tolerate sitting EOB for 15 minutes in preparation for BADL task OT Short Term Goal 2 (Week 1): Patient will complete sit<>stand with max A +2 in prep for transfer OT Short Term Goal 3 (Week 1): Patient will complete 50% of U Bbathing task while seated EOB.  Skilled Therapeutic Interventions/Progress Updates:    Pt resting in bed upon arrival. Pt had just completed PT session but agreeable to sitting EOB for BADLs. Pt moved BLE off EOB and sat EOB (HOB elevated) with supervision. Pt c/o dizziness/lightheadedness and immediately returned to partial sidelying with HOB elevated. Pt responsive. Pt attempted sitting EOB with same result. RN notified and assisted to take BP. BP 117/71. Pt continued to c/o increased fatigue. Pt unable to continue and remained supine in bed with all needs within reach. Note placed in [place] to avoid back-to-back therapies.   Therapy Documentation Precautions:  Precautions Precautions: Fall Precaution Comments: foley catheter, large buttocks wound, R heel wound Restrictions Weight Bearing Restrictions: No General: General OT Amount of Missed Time: 42 Minutes   Pain: Pain Assessment Pain Scale: 0-10 Pain Score: 5  Pain Type: Acute pain Pain Location: Buttocks Pain Orientation: Left Pain Descriptors / Indicators: Aching;Discomfort;Sharp Pain Frequency: Constant Pain Onset: On-going Patients Stated Pain Goal: 0 Pain Intervention(s):premedicated, repositioned   Therapy/Group: Individual Therapy  [name] 08/12/2020, 9:39 AM

## 2020-08-13 LAB — GLUCOSE, CAPILLARY
Glucose-Capillary: 83 mg/dL (ref 70–99)
Glucose-Capillary: 109 mg/dL — ABNORMAL HIGH (ref 70–99)
Glucose-Capillary: 82 mg/dL (ref 70–99)
Glucose-Capillary: 113 mg/dL — ABNORMAL HIGH (ref 70–99)

## 2020-08-13 NOTE — Progress Notes (Signed)
Occupational Therapy Session Note  Patient Details  Name: Carl Hill MRN: [id number] Date of Birth: 02-Nov-1970  Today's Date: 08/13/2020 OT Individual Time: 1349-1430 OT Individual Time Calculation (min): 41 min    Short Term Goals: Week 2:  OT Short Term Goal 1 (Week 2): Patient will complete sit<>stand with max A +2 in prep for transfer OT Short Term Goal 2 (Week 2): Pt will complete LB bathing tasks with max A sitting EOB using AE PRN OT Short Term Goal 3 (Week 2): Pt will don socks with max A using AE PRN  Skilled Therapeutic Interventions/Progress Updates:    Pt received semi-reclined in bed with mother present, agreeable to therapy. Denies pain during this session. Session focus on STS and standing tolerance in prep for standing ADL. Pt completes supine > sitting EOB with close S and use of bed features. STS x 6 against bari recliner throughout session with overall CGA + 2 for safety. Pt is able to tolerate standing with BUE support for ~20 seconds at at time, min VCs for upright posture, and req seated rest break in between each bout. Attempted lateral punches with pt experiencing posterior LOB 2/2 R knee buckle req mod A to guide descent, pt able to additionally assist in controlling descent to bed. Therapeutic use of self utilized to review rehab progress thus far as pt reports feeling frustrated he is unable to do it. Pt additionally reports lower back spasm, on R lateral side, guided pt in forward and lateral reaches for lower back stretch with use of bari recliner. Pt returning to stance and able to complete 1x5 B punches with unilateral UE support. Sitting EOB > supine with close S and use of bed features, pt able to pull himself up in bed when bed is flattened.   Pt left semi-reclined in bed with mother present, call bell in reach, and all immediate needs met.    Therapy Documentation Precautions:  Precautions Precautions: Fall Precaution Comments: foley catheter, large  buttocks wound, R heel wound Restrictions Weight Bearing Restrictions: No Pain: Pain Assessment Pain Scale: 0-10 Pain Score: 0-No pain ADL: See Care Tool for more details.   Therapy/Group: Individual Therapy  Volanda Napoleon MS, OTR/L  08/13/2020, 2:38 PM

## 2020-08-13 NOTE — Progress Notes (Signed)
Physical Therapy Session Note  Patient Details  Name: Carl Hill MRN: [id number] Date of Birth: 14-Jun-1970  Today's Date: 08/13/2020 PT Individual Time: 1530-1630 PT Individual Time Calculation (min): 60 min   Short Term Goals: Week 1:  PT Short Term Goal 1 (Week 1): Pt will tolerate sitting EOB with supervision x 10 minutes while completing a functional task PT Short Term Goal 2 (Week 1): Pt will perform STS with min A x 2 consistently PT Short Term Goal 3 (Week 1): Pt will initiate gait training.  Skilled Therapeutic Interventions/Progress Updates:    Pt received semi-reclined in bed, agreeable to PT session. Emptied foley bag for patient, see Flowsheet. Pt reports 10/10 pain in R mid-low back with spasms throughout session, nursing able to provide pain medication and muscle relaxers during session. Supine to sit with Supervision with use of hospital bed features. Seated back stretches with anterior and L lateral leans, pt reports some improvement in symptoms following stretches. Sit to stand with Supervision x 2 and bracing of bariatric recliner throughout session with knees blocked by leg rest of recliner. Sidesteps L/R x 5 ft each direction (x 3 reps to the L, x 2 reps to the R) with assist x 3 to brace and navigate bariatric recliner. Pt exhibits improved ability to perform side-steps this date. Standing mini-squats 3 x 3 reps with use of bari-recliner for safety. Pt has onset of B knee buckling in standing during squats, able to safely sit down on bed but with uncontrolled descent. Pt returned to supine in bed with min A needed for RLE management. Pt left semi-reclined in bed with needs in reach at end of session.  Therapy Documentation Precautions:  Precautions Precautions: Fall Precaution Comments: foley catheter, large buttocks wound, R heel wound Restrictions Weight Bearing Restrictions: No   Therapy/Group: Individual Therapy   Peter Congo, PT, DPT  08/13/2020, 5:19  PM

## 2020-08-13 NOTE — Progress Notes (Signed)
Occupational Therapy Weekly Progress Note  Patient Details  Name: Carl Hill MRN: [id number] Date of Birth: [birth year]  Beginning of progress report period: August 06, 2020 End of progress report period: August 13, 2020  Patient has met 2 of 3 short term goals. Pt is making steady progress with BADLs, sitting balance, bed mobility, and activity tolerance. Supine<>sit EOB with supervision/CGA. Pt completes UB bathing/dressing with supervision seated EOB. Pt consistently sitting EOB with supervision for approx 25-35 mins during each session. Pt performs UB therex with green theraband in supine.  Pt is progressing to sit to stands in therapy.   Patient continues to demonstrate the following deficits: muscle weakness, decreased cardiorespiratoy endurance and decreased standing balance, decreased balance strategies and difficulty maintaining precautions and therefore will continue to benefit from skilled OT intervention to enhance overall performance with BADL and Reduce care partner burden.  Patient progressing toward long term goals..  Continue plan of care.  OT Short Term Goals Week 1:  OT Short Term Goal 1 (Week 1): Patient will tolerate sitting EOB for 15 minutes in preparation for BADL task OT Short Term Goal 1 - Progress (Week 1): Met OT Short Term Goal 2 (Week 1): Patient will complete sit<>stand with max A +2 in prep for transfer OT Short Term Goal 2 - Progress (Week 1): Progressing toward goal OT Short Term Goal 3 (Week 1): Patient will complete 50% of U Bbathing task while seated EOB. OT Short Term Goal 3 - Progress (Week 1): Met Week 2:  OT Short Term Goal 1 (Week 2): Patient will complete sit<>stand with max A +2 in prep for transfer OT Short Term Goal 2 (Week 2): Pt will complete LB bathing tasks with max A sitting EOB using AE PRN OT Short Term Goal 3 (Week 2): Pt will don socks with max A using AE PRN   [name] 08/13/2020, 6:42 AM

## 2020-08-13 NOTE — Progress Notes (Signed)
PROGRESS NOTE   Subjective/Complaints:  Pt reports ate 100% breakfast- but didn't think it was "much" food.  Getting Po Dilaudid for wound care/dressing changes- nursing about to do.  LBM last evening- more firm per pt.     ROS:   Pt denies SOB, abd pain, CP, N/V/C/D, and vision changes    Objective:   No results found. Recent Labs    08/12/20 0459  WBC 7.1  HGB 9.4*  HCT 32.1*  PLT 326   Recent Labs    08/12/20 0459  NA 140  K 4.1  CL 102  CO2 30  GLUCOSE 104*  BUN 9  CREATININE 1.01  CALCIUM 8.6*    Intake/Output Summary (Last 24 hours) at 08/13/2020 1013 Last data filed at 08/13/2020 0805 Gross per 24 hour  Intake 1440 ml  Output 7650 ml  Net -6210 ml     Pressure Injury 08/06/20 Heel Right Stage 2 -  Partial thickness loss of dermis presenting as a shallow open injury with a red, pink wound bed without slough. (Active)  08/06/20 0240  Location: Heel  Location Orientation: Right  Staging: Stage 2 -  Partial thickness loss of dermis presenting as a shallow open injury with a red, pink wound bed without slough.  Wound Description (Comments):   Present on Admission: Yes    Physical Exam: Vital Signs Blood pressure 140/80, pulse 90, temperature 98.2 F (36.8 C), resp. rate 17, height 5\' 10"  (1.778 m), weight (!) 236.1 kg, SpO2 93 %.    General: awake, alert, appropriate, lying on L side in bariatric low air loss mattress, NAD HENT: conjugate gaze; oropharynx moist-  CV: regular rate and irregular rhythm; no JVD Pulmonary: CTA B/L; no W/R/R- good air movement GI: soft, NT, ND, (+)BS- protuberant- BMI 74 Psychiatric: appropriate but unhappy about issue Neurological: Ox3 Skin: Warm and dry. Has foley- also has extremely large post op wound seen in pictures as of this AM- red/pink- side granulating in edges, no slough seen  Left 2nd toe- clean- no dried blood- just a dressing  Musc: No edema in  extremities.  No tenderness in extremities. Musculoskeletal: Bilateral upper extremities: 5/5 throughout Bilateral lower extremities: Hip flexion 4+/5, distally 5/5   Assessment/Plan: 1. Functional deficits which require 3+ hours per day of interdisciplinary therapy in a comprehensive inpatient rehab setting.  Physiatrist is providing close team supervision and 24 hour management of active medical problems listed below.  Physiatrist and rehab team continue to assess barriers to discharge/monitor patient progress toward functional and medical goals  Care Tool:  Bathing    Body parts bathed by patient: Right arm,Left arm,Chest,Abdomen,Face,Right upper leg,Left upper leg   Body parts bathed by helper: Front perineal area,Buttocks,Right lower leg,Left lower leg     Bathing assist Assist Level: Maximal Assistance - Patient 24 - 49%     Upper Body Dressing/Undressing Upper body dressing   What is the patient wearing?: Pull over shirt    Upper body assist Assist Level: Supervision/Verbal cueing    Lower Body Dressing/Undressing Lower body dressing      What is the patient wearing?: Incontinence brief     Lower body assist Assist for  lower body dressing: Maximal Assistance - Patient 25 - 49%     Toileting Toileting    Toileting assist Assist for toileting: Dependent - Patient 0%     Transfers Chair/bed transfer  Transfers assist  Chair/bed transfer activity did not occur: Safety/medical concerns        Locomotion Ambulation   Ambulation assist   Ambulation activity did not occur: Safety/medical concerns          Walk 10 feet activity   Assist  Walk 10 feet activity did not occur: Safety/medical concerns        Walk 50 feet activity   Assist Walk 50 feet with 2 turns activity did not occur: Safety/medical concerns         Walk 150 feet activity   Assist Walk 150 feet activity did not occur: Safety/medical concerns         Walk 10  feet on uneven surface  activity   Assist Walk 10 feet on uneven surfaces activity did not occur: Safety/medical concerns         Wheelchair     Assist Will patient use wheelchair at discharge?: Yes Type of Wheelchair: Manual Wheelchair activity did not occur: Safety/medical concerns         Wheelchair 50 feet with 2 turns activity    Assist    Wheelchair 50 feet with 2 turns activity did not occur: Safety/medical concerns       Wheelchair 150 feet activity     Assist  Wheelchair 150 feet activity did not occur: Safety/medical concerns       Blood pressure 140/80, pulse 90, temperature 98.2 F (36.8 C), resp. rate 17, height 5\' 10"  (1.778 m), weight (!) 236.1 kg, SpO2 93 %.  Medical Problem List and Plan: 1.  Debility secondary to septic shock from necrotizing soft tissue infection of perianal, gluteal, and perineal tissues.   Continue CIR 2.  Antithrombotics: -DVT/anticoagulation:  Pharmaceutical:   Xarelto  3/15- pt doesn't want Coumadin and wants to continue on Xarelto- after prolonged d/w pt.              -antiplatelet therapy: N/A 3. Pain Management:  Continue Lyrica and elavil for neuropathy.              Oxycontin 15 mg bid with 10 mg prn for breakthrough pain.   Added Robaxin 500 mg QID prn for muscle spasms.    3/14- no IV anymore- doing ok with PO meds for dressing changes- con't regimen  3/15- getting the PO Dilaudid for wound care- otherwise, pain controlled 4. Mood: LCSW to follow for evaluation and support.              -antipsychotic agents: N/A 5. Neuropsych: This patient is capable of making decisions on his own behalf. 6. Skin/Wound Care: Air mattress for pressure relief measures.              --will need local care frequently to keep sacral wound bed clean.             --Continue foley for now.  3/10- con't sacral wound care daily and prn  Con't low air loss mattress and WOC note in chart- isn't pressure- is post op wound  3/14-  wound looks better- edges closing in by granulation and no slough con't wound care and foley due to high risk of MASD 7. Fluids/Electrolytes/Nutrition: Monitor I/Os.  8. Necrotizing fascitis: Has been on Augmentin since 02/11--> to continue for 6 months per  ID 9. T2DM with neuropathy: Hgb A1c- 9.6 (down from 13 in Aug) Will continue to monitor BS ac/hs and use SSI for elevated BS             --used Farxiga, metformin and Victoza 1.8.             --Continue Levemir bid and titrate as indicated  3/14- 99-129- well controlled- con't regimen  3/15- BGs great- almost too good- 78-104 in last 24 hours- if goes down any more, might need to back off- also, will have nurse decrease AM dose of Novolog to 9 units, not 18 units due to BG of 83 and no carbs for breakfast.  10 Hyponatremia: Likely due to multiple BM yesterday.              Sodium 138 on 3/10, continue to monitor 11. Hypokalemia:    Potassium 3.9 on 3/10 after supplementation 12. Proximal A FIb: Monitor HR tid--continue Cardizem 240 mg and amiodarone for rate control.              --monitor for symptoms with increase in activity.  13. OIC:              Discontinued colace to help bulk stools as now having loose stools. 14. Chronic right heel ulcer: Continue to paint wound daily with iodine.  15.  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 Will need bariatric equipment.  3/14- BMI down to 74.68 16. Trach site  3/9- healing- but leave open to air.  3/11- almost completely healed- scab coming off- and healthy tissue   underneath.  3/15- resolved/healed 17. Foley for urinary retention  Plan to DC on Monday  3/14-  Will keep Foley- got ok by head nurse, because he has such vast post op wounds from necrotizing fasciitis- con't for now 18. Loose stools  3/14- rectum free floating- will d/c Miralax and add Fibercon  3/15- stools better this AM- more firm with fibercon- will con't regimen 19. Disposition:  at baseline, patient  received significant assistance from his mother, aged 48. He required her assistance to wipe stool after BM due to body habitus. He was able to ambulate to bathroom and back on his own. Plan is for discharge home to father, who is a IT sales professional, and who can provide more physical support to patient. Goal is ModA level for ambulation within his room. Plan for approx 2-3 week stay to reach this goal. He is currently demonstrating improved standing tolerance. Fuller Song discussed with father.     I spent a total of 25 minutes on total care today- >50% on coordination of care and educating pt and listening to extended concerns.    LOS: 7 days A FACE TO FACE EVALUATION WAS PERFORMED  Amilya Haver 08/13/2020, 10:13 AM

## 2020-08-13 NOTE — Progress Notes (Signed)
Pt BS 82 Per NP Delle Reining give patient only 9 units of the scheduled meal coverage.

## 2020-08-13 NOTE — Patient Care Conference (Signed)
Inpatient RehabilitationTeam Conference and Plan of Care Update Date: 08/13/2020   Time: 11:07 AM    Patient Name: Carl Hill      Medical Record Number: [id number]  Date of Birth: 17-Apr-1971 Sex: Male         Room/Bed: 4W14C/4W14C-01 Payor Info: Payor: MEDICAID [place] / Plan: MEDICAID [place] ACCESS / Product Type: *No Product type* /    Admit Date/Time:  08/06/2020  1:01 AM  Primary Diagnosis:  Debility  Hospital Problems: Principal Problem:   Debility Active Problems:   Super-super obese (HCC)   Urinary retention   Hypokalemia   Labile blood glucose   Diabetic peripheral neuropathy (HCC)   Sacral pain    Expected Discharge Date: Expected Discharge Date: 09/03/20  Team Members Present: Physician leading conference: Dr. Genice Rouge Care Coodinator Present: Cecile Sheerer, LCSWA;Stacey Marlyne Beards, RN, BSN, CRRN Nurse Present: Other (comment) Lenord Fellers, RN) PT Present: Peter Congo, PT OT Present: Ardis Rowan, COTA;Jennifer Katrinka Blazing, OT PPS Coordinator present : Fae Pippin, SLP     Current Status/Progress Goal Weekly Team Focus  Bowel/Bladder   Foley cath, incont of stool. LBM 08/13/20  Regain continence of bowel, Void trial for foley  Assess qshift   Swallow/Nutrition/ Hydration             ADL's   UB bathing/dressing supervision seated EOB, tot A LB bathing; toilet hygiene dependent; sit<>stand with BUE support from EOB with CGA  min A/CGA overall  activity tolerance, sit<>stand, standing balance, BADL training, safety awareness   Mobility   Bed mobilty supervision, STS with bari recliner blocking knees +2 supervision for safety  min A overall  Standing tolerance, pre gait, LE strength   Communication             Safety/Cognition/ Behavioral Observations            Pain   pt c/o 10/10 buttocks pain. PRN medications not effective.  Pain <3/10  Assess pain qshift/prn   Skin   stage 2 on heel, exoriation on abd/leg/hip. Large open wound on buttocks   Promote healing and prevention of infection  Assess skin qshift. change dressings per order.     Discharge Planning:  Pt to d/c to mother's home who will provide 24/7 care.   Team Discussion: Patient says he doesn't get enough food from the kitchen. Trach location has fully healed. Foley in place, CBG's-monitoring meal intake due to the amount of insulin for meal coverage, dressing changes frustrate him. Discharging home with mother, patient has Medicaid and it's hard to find home health.  Patient on target to meet rehab goals: Supervision for bed mobility, able to stand at recliner. Can do EOB bathing and dressing with set up. Can do sit to stand. Working on upper extremity strengthening with theraband. Has min assist to contact guard goals.   *See Care Plan and progress notes for long and short-term goals.   Revisions to Treatment Plan:  May decrease Levemir.  Teaching Needs: Family education, medication management, pain management, skin/wound care, foley care education, bowel/ bladder management, transfer training, gait training, stair training, safety awareness, balance training, endurance training.  Current Barriers to Discharge: Inaccessible home environment, Decreased caregiver support, Home enviroment access/layout, Incontinence, Wound care, Lack of/limited family support, Weight, Weight bearing restrictions, Medication compliance and Behavior  Possible Resolutions to Barriers: Continue current medications, provide emotional support.     Medical Summary Current Status: post op wound on buttocks- rectum free floating- has foley- BGs too good- might require  reduction in Levemir;  Barriers to Discharge: Decreased family/caregiver support;Home enviroment access/layout;Incontinence;Medical stability;Weight;Wound care  Barriers to Discharge Comments: biggest limiters- BMI 74 and large post op wound 26x17 cm- from necrotizing fasciitis, incontinence of bowel- has foley due to  wounds Possible Resolutions to Barriers/Weekly Focus: maybe reduce Levemir vs Novolog? con't wound care for buttocks and heel- stage II- works hard with PT/OT; goals min A goals; RW is necessary at d/c-09/03/20   Continued Need for Acute Rehabilitation Level of Care: The patient requires daily medical management by a physician with specialized training in physical medicine and rehabilitation for the following reasons: Direction of a multidisciplinary physical rehabilitation program to maximize functional independence : Yes Medical management of patient stability for increased activity during participation in an intensive rehabilitation regime.: Yes Analysis of laboratory values and/or radiology reports with any subsequent need for medication adjustment and/or medical intervention. : Yes   I attest that I was present, lead the team conference, and concur with the assessment and plan of the team.   Tennis Must 08/13/2020, 5:00 PM

## 2020-08-13 NOTE — Progress Notes (Signed)
Occupational Therapy Session Note  Patient Details  Name: Carl Hill MRN: [id number] Date of Birth: [birth year]  Today's Date: 08/13/2020 OT Individual Time: 0830-0900 OT Individual Time Calculation (min): 30 min  OT missed time: 30 mins for nursing care   Short Term Goals: Week 2:  OT Short Term Goal 1 (Week 2): Patient will complete sit<>stand with max A +2 in prep for transfer OT Short Term Goal 2 (Week 2): Pt will complete LB bathing tasks with max A sitting EOB using AE PRN OT Short Term Goal 3 (Week 2): Pt will don socks with max A using AE PRN  Skilled Therapeutic Interventions/Progress Updates:    Pt missed 30 mins skilled OT services at beginning of scheduled time secondary to nursing care for dressing change. UB Bathing/dressing seated EOB with supervision. BUE therex seated EOB with green theraband. Diagonals and rowing-8x3. Pt returned to supine with supervision. Pt remained in bed with all needs within reach.  Therapy Documentation Precautions:  Precautions Precautions: Fall Precaution Comments: foley catheter, large buttocks wound, R heel wound Restrictions Weight Bearing Restrictions: No General: General OT Amount of Missed Time: 30 Minutes Pain: Pt c/o increased buttocks pain following dressing change; repositioned  Therapy/Group: Individual Therapy  [name] 08/13/2020, 10:02 AM

## 2020-08-13 NOTE — Progress Notes (Signed)
Physical Therapy Session Note  Patient Details  Name: Carl Hill MRN: [id number] Date of Birth: 10/21/1970  Today's Date: 08/13/2020 PT Individual Time: 1130-1158 PT Individual Time Calculation (min): 28 min   Short Term Goals: Week 1:  PT Short Term Goal 1 (Week 1): Pt will tolerate sitting EOB with supervision x 10 minutes while completing a functional task PT Short Term Goal 2 (Week 1): Pt will perform STS with min A x 2 consistently PT Short Term Goal 3 (Week 1): Pt will initiate gait training.  Skilled Therapeutic Interventions/Progress Updates:    Pt side lying in bed on arrival with mother present, agreeable to therapy. Sidelying>EOB with supervision, STS with bari recliner and supervision +2 for safety. Sit to stand x 5 reps to bari recliner throughout session with Supervision assist x 2 and bracing of recliner. Pt has onset of LE buckling and one instance of uncontrolled descent back into sitting on bed. Pt has onset of pain in buttocks region following this uncontrolled descent. Dressing to buttock wound found to be soiled and coming off, nursing notified and able to redress wound. Remainder of session focused on discussion of patient's home setup and equipment he can safely use in the home. Pt reports using a SPC and furniture walking prior to admission. Pt reports he does own a bari RW that can fit through doorways in his home if he goes sideways. Education with patient and his mother that at this point in time therapy recommending use of RW upon d/c home pending progress. Pt also has several steps to enter his home, mother provided photos of setup. Discussed installing a ramp and pt's mother reports they have been looking into having a ramp installed. Discussed safety risks regarding stair management and pt's current functional level. Pt returned to sidelying at end of session, Supervision for bed mobility with use of hospital bed features. Pt left in L sidelying in bed with needs  in reach at end of session.  Therapy Documentation Precautions:  Precautions Precautions: Fall Precaution Comments: foley catheter, large buttocks wound, R heel wound Restrictions Weight Bearing Restrictions: No   Therapy/Group: Individual Therapy   Peter Congo, PT, DPT  08/13/2020, 5:26 PM

## 2020-08-13 NOTE — Progress Notes (Signed)
Patient ID: Carl Hill, male   DOB: 1971/05/12, 50 y.o.   MRN: [id number]  SW met with pt and pt mother to provide updates from team conference, and d/c date 4/5. SW discussed concerns related to wound care, and finding a HHA to avoid going to wound care clinic. SW to follow-up with pt mother about preferred HHA.   Loralee Pacas, MSW, [place] Office: [phone] Cell: [phone] Fax: [phone]

## 2020-08-14 LAB — GLUCOSE, CAPILLARY
Glucose-Capillary: 110 mg/dL — ABNORMAL HIGH (ref 70–99)
Glucose-Capillary: 124 mg/dL — ABNORMAL HIGH (ref 70–99)
Glucose-Capillary: 88 mg/dL (ref 70–99)
Glucose-Capillary: 79 mg/dL (ref 70–99)
Glucose-Capillary: 98 mg/dL (ref 70–99)
Glucose-Capillary: 144 mg/dL — ABNORMAL HIGH (ref 70–99)

## 2020-08-14 MED ORDER — METHOCARBAMOL 750 MG PO TABS
750.0000 mg | ORAL_TABLET | Freq: Four times a day (QID) | ORAL | Status: DC | PRN
  Administered 2020-08-14 – 2020-09-10 (×38): 750 mg via ORAL
  Filled 2020-08-14 (×41): qty 1

## 2020-08-14 MED ORDER — INSULIN ASPART 100 UNIT/ML ~~LOC~~ SOLN
10.0000 [IU] | Freq: Three times a day (TID) | SUBCUTANEOUS | Status: DC
  Administered 2020-08-14 – 2020-09-12 (×79): 10 [IU] via SUBCUTANEOUS

## 2020-08-14 NOTE — Progress Notes (Signed)
Physical Therapy Weekly Progress Note  Patient Details  Name: Carl Hill MRN: [id number] Date of Birth: 08/27/1970  Beginning of progress report period: August 06, 2020 End of progress report period: August 14, 2020  Today's Date: 08/14/2020 PT Individual Time: 1400-1500, 1600-1700 PT Individual Time Calculation (min): 60 min, 60 min  Patient has met 2 of 3 short term goals. Bed mobility with supervision and use of bed features. STS with supervision x2 for safety with knees blocked by bariatric recliner. Gait training initiated with bari recliner and wc follow, assist x 3. Pt is extremely motivated and exhibits great participation in therapy sessions.   Patient continues to demonstrate the following deficits muscle weakness, decreased cardiorespiratoy endurance and decreased standing balance, decreased postural control and decreased balance strategies and therefore will continue to benefit from skilled PT intervention to increase functional independence with mobility.  Patient progressing toward long term goals..  Continue plan of care.  PT Short Term Goals Week 1:  PT Short Term Goal 1 (Week 1): Pt will tolerate sitting EOB with supervision x 10 minutes while completing a functional task PT Short Term Goal 1 - Progress (Week 1): Met PT Short Term Goal 2 (Week 1): Pt will perform STS with min A x 2 consistently PT Short Term Goal 2 - Progress (Week 1): Met PT Short Term Goal 3 (Week 1): Pt will initiate gait training. PT Short Term Goal 3 - Progress (Week 1): Met Week 2:  PT Short Term Goal 1 (Week 2): Pt will initiate gait training with RW. PT Short Term Goal 2 (Week 2): Pt will stand with RW x 2 minutes with min A x 2. PT Short Term Goal 3 (Week 2): Pt will tolerate >20 min of activity without incidence of pain.  Skilled Therapeutic Interventions/Progress Updates:  Session 1: Pt side lying in bed on arrival, agreeable to therapy. Pt transitioned to EOB with supervision and use of  bed features. Pt performed standing activity with bari recliner blocking knees and supervision x 2 for safety. Weight shift with small lateral lunge 2 x 10, in attempt to improve knee control. Pt was able to perform 4 bouts of short forward and backward stepping, x 4, x 16, x 8. Pt used straight leg to step in order to prevent knee buckling. Pt asked about loftstrand crutches for home use and was educated that this is not a safe option at this time. Pt was issued home measurement sheet to determine if bariatric RW will fit through door ways. Pt returned to bed with supervision and remained in sidelying with all needs in reach.  Session 2: Pt side lying in bed on arrival, agreeable to therapy. Pt transitioned to EOB with supervision and use of bed features. Pt was able to stand to [employer] as previously described and walked with recliner and close wc follow 3 x 10 ft, with 1 episode of knee buckling due to fatigue. Required +2 for steering recliner and +1 for wc follow. Pt performed backward walking x 4 ft to return to bed. Pt returned to sidelying with supervision and was left with all needs in reach.  Therapy Documentation Precautions:  Precautions Precautions: Fall Precaution Comments: foley catheter, large buttocks wound, R heel wound Restrictions Weight Bearing Restrictions: No   Therapy/Group: Individual Therapy  Sharen Counter 08/14/2020, 3:26 PM

## 2020-08-14 NOTE — Progress Notes (Signed)
Recreational Therapy Session Note  Patient Details  Name: Carl Hill MRN: [id number] Date of Birth: 02-04-1971 Today's Date: 08/14/2020  Pain: no c/o Skilled Therapeutic Interventions/Progress Updates: Pt seen during co-treat with OT.  Pt sitting EOB with setup assistnace, directing his care for self care tasks.  LRT discussion included treatment planning, recognizing progress, celebrating successes along the way - STGs vs solely being focused on LTGs and their impact on rehab and recovery.  Pt stated understanding.  Therapy/Group: Co-Treatment   Sigismund Cross 08/14/2020, 11:19 AM

## 2020-08-14 NOTE — Progress Notes (Signed)
PROGRESS NOTE   Subjective/Complaints:  Had 2 episodes yesterday that 9 units, not 18 units of Novolog were given due to lower BGs- BGs running 82-124- so not high- so will back off on Novolog to 10 units- order plaeced.  Having R low back spasms- didn't have  A chance to take Robaxin prn since schedules weren't given this AM- will change to 750 mg q6 hours since not lasting long enough.   R>L knee buckles with standing- will try ACE wrap to stabilize knee.  Had a big red spot on R anterior calf- thinks it's better.  Asking to have HbA1c checke-d has been ~ 2 months since last checked- was 9.6- prior was 13+.  [place] Spaniel was told wound care order said prn- will change to BID and prn.   ROS:   Pt denies SOB, abd pain, CP, N/V/C/D, and vision changes   Objective:   No results found. Recent Labs    08/12/20 0459  WBC 7.1  HGB 9.4*  HCT 32.1*  PLT 326   Recent Labs    08/12/20 0459  NA 140  K 4.1  CL 102  CO2 30  GLUCOSE 104*  BUN 9  CREATININE 1.01  CALCIUM 8.6*    Intake/Output Summary (Last 24 hours) at 08/14/2020 1303 Last data filed at 08/14/2020 1012 Gross per 24 hour  Intake 320 ml  Output 7400 ml  Net -7080 ml     Pressure Injury 08/06/20 Heel Right Stage 2 -  Partial thickness loss of dermis presenting as a shallow open injury with a red, pink wound bed without slough. (Active)  08/06/20 0240  Location: Heel  Location Orientation: Right  Staging: Stage 2 -  Partial thickness loss of dermis presenting as a shallow open injury with a red, pink wound bed without slough.  Wound Description (Comments):   Present on Admission: Yes    Physical Exam: Vital Signs Blood pressure (!) 130/92, pulse 100, temperature 97.6 F (36.4 C), temperature source Oral, resp. rate 16, height 5\' 10"  (1.778 m), weight (!) 234.3 kg, SpO2 97 %.     General: awake, alert, appropriate, on bariatric low air loss mattress,   NAD HENT: conjugate gaze; oropharynx moist CV: regular but borderline tachycardic rate; no JVD Pulmonary: CTA B/L; no W/R/R- good air movement GI: soft, NT, ND, (+)BS- protuberant- BMI 74.12- down some Psychiatric: appropriate- asking questions Neurological: Ox3 Skin: Warm and dry. Has foley- also has extremely large post op wound seen in pictures as of this AM- red/pink- side granulating in edges, no slough seen  Has a mildly erythematous spot on R anterior mid calf- not associated with anything- is not warm to touch.  Left 2nd toe- clean- no dried blood- just a dressing  Musc: No edema in extremities.  No tenderness in extremities. Musculoskeletal: Bilateral upper extremities: 5/5 throughout Bilateral lower extremities: Hip flexion 4+/5, distally 5/5   Assessment/Plan: 1. Functional deficits which require 3+ hours per day of interdisciplinary therapy in a comprehensive inpatient rehab setting.  Physiatrist is providing close team supervision and 24 hour management of active medical problems listed below.  Physiatrist and rehab team continue to assess barriers to discharge/monitor patient  progress toward functional and medical goals  Care Tool:  Bathing    Body parts bathed by patient: Right arm,Left arm,Chest,Abdomen,Face,Right upper leg,Left upper leg   Body parts bathed by helper: Front perineal area,Buttocks,Right lower leg,Left lower leg     Bathing assist Assist Level: Maximal Assistance - Patient 24 - 49%     Upper Body Dressing/Undressing Upper body dressing   What is the patient wearing?: Pull over shirt    Upper body assist Assist Level: Supervision/Verbal cueing    Lower Body Dressing/Undressing Lower body dressing      What is the patient wearing?: Incontinence brief     Lower body assist Assist for lower body dressing: Maximal Assistance - Patient 25 - 49%     Toileting Toileting    Toileting assist Assist for toileting: Dependent - Patient 0%      Transfers Chair/bed transfer  Transfers assist  Chair/bed transfer activity did not occur: Safety/medical concerns        Locomotion Ambulation   Ambulation assist   Ambulation activity did not occur: Safety/medical concerns          Walk 10 feet activity   Assist  Walk 10 feet activity did not occur: Safety/medical concerns        Walk 50 feet activity   Assist Walk 50 feet with 2 turns activity did not occur: Safety/medical concerns         Walk 150 feet activity   Assist Walk 150 feet activity did not occur: Safety/medical concerns         Walk 10 feet on uneven surface  activity   Assist Walk 10 feet on uneven surfaces activity did not occur: Safety/medical concerns         Wheelchair     Assist Will patient use wheelchair at discharge?: Yes Type of Wheelchair: Manual Wheelchair activity did not occur: Safety/medical concerns         Wheelchair 50 feet with 2 turns activity    Assist    Wheelchair 50 feet with 2 turns activity did not occur: Safety/medical concerns       Wheelchair 150 feet activity     Assist  Wheelchair 150 feet activity did not occur: Safety/medical concerns       Blood pressure (!) 130/92, pulse 100, temperature 97.6 F (36.4 C), temperature source Oral, resp. rate 16, height 5\' 10"  (1.778 m), weight (!) 234.3 kg, SpO2 97 %.  Medical Problem List and Plan: 1.  Debility secondary to septic shock from necrotizing soft tissue infection of perianal, gluteal, and perineal tissues.  3/16- will try ACE wrap to stabilize R knee buckling-    Continue CIR 2.  Antithrombotics: -DVT/anticoagulation:  Pharmaceutical:   Xarelto  3/15- pt doesn't want Coumadin and wants to continue on Xarelto- after prolonged d/w pt.              -antiplatelet therapy: N/A 3. Pain Management:  Continue Lyrica and elavil for neuropathy.              Oxycontin 15 mg bid with 10 mg prn for breakthrough pain.   Added  Robaxin 500 mg QID prn for muscle spasms.    3/14- no IV anymore- doing ok with PO meds for dressing changes- con't regimen  3/15- getting the PO Dilaudid for wound care- otherwise, pain controlled  3/16- changed robaxin to 750 mg q6 hours prn 4. Mood: LCSW to follow for evaluation and support.              -  antipsychotic agents: N/A 5. Neuropsych: This patient is capable of making decisions on his own behalf. 6. Skin/Wound Care: Air mattress for pressure relief measures.              --will need local care frequently to keep sacral wound bed clean.             --Continue foley for now.  3/10- con't sacral wound care daily and prn  Con't low air loss mattress and WOC note in chart- isn't pressure- is post op wound  3/14- wound looks better- edges closing in by granulation and no slough con't wound care and foley due to high risk of MASD  3/16- change wound care to BID AND prn 7. Fluids/Electrolytes/Nutrition: Monitor I/Os.  8. Necrotizing fascitis: Has been on Augmentin since 02/11--> to continue for 6 months per ID 9. T2DM with neuropathy: Hgb A1c- 9.6 (down from 13 in Aug) Will continue to monitor BS ac/hs and use SSI for elevated BS             --used Farxiga, metformin and Victoza 1.8.             --Continue Levemir bid and titrate as indicated  3/14- 99-129- well controlled- con't regimen  3/15- BGs great- almost too good- 78-104 in last 24 hours- if goes down any more, might need to back off- also, will have nurse decrease AM dose of Novolog to 9 units, not 18 units due to BG of 83 and no carbs for breakfast.   3/16- will decrease Novolog to 10 units TID with meals- since BGs 82-124 in last 24 hours 10 Hyponatremia: Likely due to multiple BM yesterday.              Sodium 138 on 3/10, continue to monitor 11. Hypokalemia:    Potassium 3.9 on 3/10 after supplementation 12. Proximal A FIb: Monitor HR tid--continue Cardizem 240 mg and amiodarone for rate control.              --monitor  for symptoms with increase in activity.  3/16- HR 99 this AM- will monitor for RVR- esp with activity  13. OIC:              Discontinued colace to help bulk stools as now having loose stools. 14. Chronic right heel ulcer: Continue to paint wound daily with iodine.   3/16- is Stage II- healing 15.  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 Will need bariatric equipment.  3/14- BMI down to 74.68  3/16- BMI down to 74. 12 16. Trach site  3/9- healing- but leave open to air.  3/11- almost completely healed- scab coming off- and healthy tissue   underneath.  3/15- resolved/healed 17. Foley for urinary retention  Plan to DC on Monday  3/14-  Will keep Foley- got ok by head nurse, because he has such vast post op wounds from necrotizing fasciitis- con't for now 18. Loose stools  3/14- rectum free floating- will d/c Miralax and add Fibercon  3/15- stools better this AM- more firm with fibercon- will con't regimen 19. Disposition:  at baseline, patient received significant assistance from his mother, aged 58. He required her assistance to wipe stool after BM due to body habitus. He was able to ambulate to bathroom and back on his own. Plan is for discharge home to father, who is a IT sales professional, and who can provide more physical support to patient. Goal is ModA level for ambulation within his room. Plan for  approx 2-3 week stay to reach this goal. He is currently demonstrating improved standing tolerance. Fuller Song discussed with father.       LOS: 8 days A FACE TO FACE EVALUATION WAS PERFORMED  Mardee Clune 08/14/2020, 1:03 PM

## 2020-08-14 NOTE — Progress Notes (Signed)
Occupational Therapy Session Note  Patient Details  Name: Carl Hill MRN: [id number] Date of Birth: 28-Dec-1970  Today's Date: 08/14/2020 OT Individual Time: 1000-1100 OT Individual Time Calculation (min): 60 min    Short Term Goals: Week 2:  OT Short Term Goal 1 (Week 2): Patient will complete sit<>stand with max A +2 in prep for transfer OT Short Term Goal 2 (Week 2): Pt will complete LB bathing tasks with max A sitting EOB using AE PRN OT Short Term Goal 3 (Week 2): Pt will don socks with max A using AE PRN  Skilled Therapeutic Interventions/Progress Updates:    Co-treat with Recreational Therapist. Focus on bed mobility, sitting balance, UB bathing/dressing and grooming, sit<>stand, standing balance, and discharge planning. Bed mobility with supervision. Pt directs care appropriately. Pt completed UB bathing/dressing tasks in addition to shaving tasks seated EOB. Sit<>stand with bari recliner for support with supervision. Standing balance and small side steps with supervision using recliner for support. Discussed home envirionment  And recommendations. Pt returned to supine in bed. All needs within reach.   Therapy Documentation Precautions:  Precautions Precautions: Fall Precaution Comments: foley catheter, large buttocks wound, R heel wound Restrictions Weight Bearing Restrictions: No Pain: Pain Assessment Pain Scale: 0-10 Pain Score: 6  Pain Type: Acute pain Pain Location: Buttocks Pain Orientation: Right;Left Pain Radiating Towards: back Pain Descriptors / Indicators: Aching Pain Frequency: Intermittent Patients Stated Pain Goal: 3 Pain Intervention(s): premedicated and repositioned  Therapy/Group: Individual Therapy  [name] 08/14/2020, 12:18 PM

## 2020-08-14 NOTE — Progress Notes (Incomplete)
Patient ID: Carl Hill, male   DOB: [birth year], 51 y.o.   MRN: [id number]   Cecile Sheerer, MSW, LCSWA Office: [phone] Cell: [phone] Fax: [phone]

## 2020-08-14 NOTE — Progress Notes (Signed)
Physical Therapy Session Note  Patient Details  Name: Carl Hill MRN: [id number] Date of Birth: Sep 29, 1970  Today's Date: 08/14/2020 PT Individual Time: 9702-6378 PT Individual Time Calculation (min): 25 min   Short Term Goals: Week 2:  PT Short Term Goal 1 (Week 2): Pt will initiate gait training. PT Short Term Goal 2 (Week 2): Pt will stand with LRAD x 2 minutes with min A x 2. PT Short Term Goal 3 (Week 2): Pt will tolerate >20 min of activity without incidence of pain.  Skilled Therapeutic Interventions/Progress Updates:    Pt received supine in bed and agreeable to therapy session. Supine>sititng L EOB, relying on HOB elevation feature and bedrails, with close supervision for safety. Sit<>stands to/from EOB using bari-recliner in front of patient facing him to provide bilateral knee block/support for patient safety x5 reps with +2 CGA for safety - tolerated standing up to 2 minutes 1x and then performed 1x20 and 1x15 alternating cross body shoulder taps to decrease BUE support on recliner and promote increased upright posture. Pt requires prolonged seated therapeutic rest breaks between reps with labored breathing noted. On 2nd stand pt was experiencing "muscle spasm" in R lower back - declines intervention at this time but limited time in standing that set to provide further extended rest break and manage pain. Sit>supine relying heavily on bed features with increased time to complete with supervision. Pt left supine in bed with needs in reach and MD present for morning assessment.   Therapy Documentation Precautions:  Precautions Precautions: Fall Precaution Comments: foley catheter, large buttocks wound, R heel wound Restrictions Weight Bearing Restrictions: No  Pain:   Reports R lower back muscle spasms - provided seated therapeutic rest breaks for pain management with pt declining any further intervention at this time (soft tissue mobilization, medications, etc) as pt  reports planning to address this after session.   Therapy/Group: Individual Therapy  Ginny Forth , PT, DPT, CSRS  08/14/2020, 7:42 AM

## 2020-08-15 DIAGNOSIS — M62838 Other muscle spasm: Secondary | ICD-10-CM | POA: Clinically undetermined

## 2020-08-15 DIAGNOSIS — E162 Hypoglycemia, unspecified: Secondary | ICD-10-CM | POA: Clinically undetermined

## 2020-08-15 DIAGNOSIS — M2351 Chronic instability of knee, right knee: Secondary | ICD-10-CM | POA: Diagnosis present

## 2020-08-15 LAB — HEMOGLOBIN A1C
Hgb A1c MFr Bld: 5.9 % — ABNORMAL HIGH (ref 4.8–5.6)
Mean Plasma Glucose: 122.63 mg/dL

## 2020-08-15 LAB — GLUCOSE, CAPILLARY
Glucose-Capillary: 91 mg/dL (ref 70–99)
Glucose-Capillary: 147 mg/dL — ABNORMAL HIGH (ref 70–99)
Glucose-Capillary: 114 mg/dL — ABNORMAL HIGH (ref 70–99)
Glucose-Capillary: 143 mg/dL — ABNORMAL HIGH (ref 70–99)

## 2020-08-15 MED ORDER — INSULIN DETEMIR 100 UNIT/ML ~~LOC~~ SOLN
40.0000 [IU] | Freq: Two times a day (BID) | SUBCUTANEOUS | Status: DC
  Administered 2020-08-15 – 2020-09-12 (×56): 40 [IU] via SUBCUTANEOUS
  Filled 2020-08-15 (×58): qty 0.4

## 2020-08-15 NOTE — Progress Notes (Signed)
Occupational Therapy Session Note  Patient Details  Name: Carl Hill MRN: [id number] Date of Birth: [birth year]  Today's Date: 08/15/2020 OT Individual Time: 0900-1000 OT Individual Time Calculation (min): 60 min    Short Term Goals: Week 2:  OT Short Term Goal 1 (Week 2): Patient will complete sit<>stand with max A +2 in prep for transfer OT Short Term Goal 2 (Week 2): Pt will complete LB bathing tasks with max A sitting EOB using AE PRN OT Short Term Goal 3 (Week 2): Pt will don socks with max A using AE PRN  Skilled Therapeutic Interventions/Progress Updates:    Pt resting in bed upon arrival with mother present. OT intervention with focus on bed mobility, sitting balance, sit<>stand, UB bathing/dressing, activity tolerance, and standing balance with unilateral UE support. Pt c/o back spasms as noted below and Kinesio Tape applied for pain mgmt. Pt reported some relief. UB bathing/dressing at supervision level. Pt independent with directing care. Sit<>stand with bari recliner with supervision. While standing, pt engaged in reaching activity with focus on trunk rotation and UE stretching. Pt performed activity 3x10. Pt returned to supine in bed (supervision). Pt remained in bed with all needs within reach. Mother present.   Therapy Documentation Precautions:  Precautions Precautions: Fall Precaution Comments: foley catheter, large buttocks wound, R heel wound Restrictions Weight Bearing Restrictions: No   Pain:  Pt c/o some lower Rt back spasms when sitting; repositioned and Kinesio Tape applied   Therapy/Group: Individual Therapy  [name] 08/15/2020, 10:33 AM

## 2020-08-15 NOTE — Progress Notes (Signed)
Physical Therapy Session Note  Patient Details  Name: Carl Hill MRN: [id number] Date of Birth: May 24, 1971  Today's Date: 08/15/2020 PT Individual Time: 1400-1500 PT Individual Time Calculation (min): 60 min   Short Term Goals: Week 2:  PT Short Term Goal 1 (Week 2): Pt will initiate gait training with RW. PT Short Term Goal 2 (Week 2): Pt will stand with RW x 2 minutes with min A x 2. PT Short Term Goal 3 (Week 2): Pt will tolerate >20 min of activity without incidence of pain.  Skilled Therapeutic Interventions/Progress Updates:    Patient in supine on Kreg bed with bariatric set up.  Patient instructing therapist/tech in managing bed for supine to sit and performed with S with heavy use of rails and momentum.  Wrapped R knee with ACE wrap in sitting.  Seated EOB performed sit to stand x 4 pulling up on barichair each time and standing for up to 30-40 seconds at times able to reach with UE's to tap opposite side of the chair.  Patient transfer to w/c after stepping forward with barichair and w/c placed behind him.  Pushed in w/c to dayroom pt using legs to pull as well since no legrests, but obtained during session.  Patient on Kinetron for LE strength at 20-30 cm/sec x 3 minutes.  Placed legrests on w/c and pushed in w/c to room.  Attempted standing from w/c pulling on bed rail, but unable to used bari chair to stand and then NT in room to remove w/c and pt stepped back to bed with bari chair.  Sit to supine with S and pt using bed rails to pull up and position.  Left with call bell/needs in reach.   Therapy Documentation Precautions:  Precautions Precautions: Fall Precaution Comments: foley catheter, large buttocks wound, R heel wound Restrictions Weight Bearing Restrictions: No Pain: Pain Assessment Pain Score: 4  Pain Type: Acute pain Pain Location: Buttocks Pain Descriptors / Indicators: Discomfort Pain Onset: On-going Pain Intervention(s): Ambulation/increased  activity   Therapy/Group: Individual Therapy  Elray Mcgregor  Sheran Lawless, PT 08/15/2020, 6:10 PM

## 2020-08-15 NOTE — Progress Notes (Signed)
Pt again requests nurse to room and reports "poop pain". This nurse asks pt if he has had this pain before and what was done about the pain. Pt states "yes. It's when I was constipated. They did an xray and gave me a milk of magnesia enema". As pt has had multiple BMs in the last few days, but pt is reporting constipation pt was given a dose of PRN miralax.

## 2020-08-15 NOTE — Progress Notes (Signed)
Occupational Therapy Session Note  Patient Details  Name: Carl Hill MRN: [id number] Date of Birth: Nov 02, 1970  Today's Date: 08/15/2020 OT Individual Time: 6384-5364 OT Individual Time Calculation (min): 30 min   Short Term Goals: Week 2:  OT Short Term Goal 1 (Week 2): Patient will complete sit<>stand with max A +2 in prep for transfer OT Short Term Goal 2 (Week 2): Pt will complete LB bathing tasks with max A sitting EOB using AE PRN OT Short Term Goal 3 (Week 2): Pt will don socks with max A using AE PRN  Skilled Therapeutic Interventions/Progress Updates:    Pt greeted semi-reclined in bed and agreeable to OT treatment session. Pt told OT all of the progress he has made in the past week with therapies. Pt agreeable to UB there-ex at bed level using level 3 green theraband. Pt completed 3 sets of 15 chest pull, bicep curl, and triceps press with rest breaks in between. Pt left semi-reclined in bed at end of session with needs met.   Therapy Documentation Precautions:  Precautions Precautions: Fall Precaution Comments: foley catheter, large buttocks wound, R heel wound Restrictions Weight Bearing Restrictions: No Pain:  Pt reports pain is controlled  Therapy/Group: Individual Therapy  Valma Cava 08/15/2020, 3:16 PM

## 2020-08-15 NOTE — Progress Notes (Signed)
Physical Therapy Session Note  Patient Details  Name: Carl Hill MRN: [id number] Date of Birth: 1971-02-22  Today's Date: 08/15/2020 PT Individual Time: 1104-1202 PT Individual Time Calculation (min): 58 min   Short Term Goals: Week 1:  PT Short Term Goal 1 (Week 1): Pt will tolerate sitting EOB with supervision x 10 minutes while completing a functional task PT Short Term Goal 1 - Progress (Week 1): Met PT Short Term Goal 2 (Week 1): Pt will perform STS with min A x 2 consistently PT Short Term Goal 2 - Progress (Week 1): Met PT Short Term Goal 3 (Week 1): Pt will initiate gait training. PT Short Term Goal 3 - Progress (Week 1): Met Week 2:  PT Short Term Goal 1 (Week 2): Pt will initiate gait training with RW. PT Short Term Goal 2 (Week 2): Pt will stand with RW x 2 minutes with min A x 2. PT Short Term Goal 3 (Week 2): Pt will tolerate >20 min of activity without incidence of pain.  Skilled Therapeutic Interventions/Progress Updates:    Pt received sidelying in bed with RN performing sacral wound care and pt's mother present in room. He did not report any pain and agreeable to therapy. Pt R knee wrapped with ACE band to provide proprioceptive feedback for LE stability; pt verbalized "it may be placebo but it works." Bed moved to wall near room entrance. Pt transitioned to EOB with supervision and use of bed features. Pt able to stand with +2 supervision for safety with use of bariatric recliner to block knees. Gait training 4x74f (from bed to window); +2 required for recliner steering and +1 for WC follow behind. Pt wheeled back beside bed after reaching window each time. Pt verbally cued to look up when walking and naturally moved faster when doing so. Reciprocal stepping pattern noted. No signs of knee buckling present. Pt stood with bari recliner, WC was removed from behind, and pt walked backwards ~[id number] EOB was reached and sat down. Pt graded therapy session as "grape" on his  self-made difficulty scale. Pt returned to supine in bed with supervision. Bed moved back to original position away from wall; needs available within reach.  Therapy Documentation Precautions:  Precautions Precautions: Fall Precaution Comments: foley catheter, large buttocks wound, R heel wound Restrictions Weight Bearing Restrictions: No    Therapy/Group: Individual Therapy  KiEleonore ChiquitoSPT 08/15/2020, 12:32 PM

## 2020-08-15 NOTE — Progress Notes (Signed)
PROGRESS NOTE   Subjective/Complaints:  Pt reporta spasms only hit 1 more time AFTER muscle relaxant yesterday.  Walked from bed to chair 3x yesterday holding on to chair.  ACE wrap slipped down R nee- we discussed possible RLE brac-e think it's too heavy - could impair therapy.   Very small BM early this AM.  Wondering if should take Metformin again or not.   ROS:   Pt denies SOB, abd pain, CP, N/V/C/D, and vision changes   Objective:   No results found. No results for input(s): WBC, HGB, HCT, PLT in the last 72 hours. No results for input(s): NA, K, CL, CO2, GLUCOSE, BUN, CREATININE, CALCIUM in the last 72 hours.  Intake/Output Summary (Last 24 hours) at 08/15/2020 1317 Last data filed at 08/15/2020 0917 Gross per 24 hour  Intake 2715 ml  Output 4900 ml  Net -2185 ml     Pressure Injury 08/06/20 Heel Right Stage 2 -  Partial thickness loss of dermis presenting as a shallow open injury with a red, pink wound bed without slough. (Active)  08/06/20 0240  Location: Heel  Location Orientation: Right  Staging: Stage 2 -  Partial thickness loss of dermis presenting as a shallow open injury with a red, pink wound bed without slough.  Wound Description (Comments):   Present on Admission: Yes    Physical Exam: Vital Signs Blood pressure 139/74, pulse 90, temperature 98.9 F (37.2 C), temperature source Oral, resp. rate 16, height 5\' 10"  (1.778 m), weight (!) 239.7 kg, SpO2 100 %.   General: awake, alert, appropriate, laying in bariatric low air loss mattress, - BMI 75.82- went up some; NAD HENT: conjugate gaze; oropharynx moist- smiling equally CV: regular rate and rhythm; no JVD Pulmonary: CTA B/L; no W/R/R- good air movement GI: soft, NT, ND, (+)BS- hypoactive; protuberant Psychiatric: appropriate- cordial- interactive Neurological: Ox3 Skin: Warm and dry. Has foley- also has extremely large post op wound seen in  pictures as of this AM- red/pink- side granulating in edges, no slough seen  Has a mildly erythematous spot on R anterior mid calf- not associated with anything- is not warm to touch. - took dressing off- tore skin very slightly- when removed slowly Left 2nd toe- clean- no dried blood- just a dressing - no change Musc: No edema in extremities.  No tenderness in extremities. Musculoskeletal: Bilateral upper extremities: 5/5 throughout Bilateral lower extremities: Hip flexion 4+/5, distally 5/5   Assessment/Plan: 1. Functional deficits which require 3+ hours per day of interdisciplinary therapy in a comprehensive inpatient rehab setting.  Physiatrist is providing close team supervision and 24 hour management of active medical problems listed below.  Physiatrist and rehab team continue to assess barriers to discharge/monitor patient progress toward functional and medical goals  Care Tool:  Bathing    Body parts bathed by patient: Right arm,Left arm,Chest,Abdomen,Face,Right upper leg,Left upper leg   Body parts bathed by helper: Front perineal area,Buttocks,Right lower leg,Left lower leg     Bathing assist Assist Level: Maximal Assistance - Patient 24 - 49%     Upper Body Dressing/Undressing Upper body dressing   What is the patient wearing?: Pull over shirt    Upper  body assist Assist Level: Supervision/Verbal cueing    Lower Body Dressing/Undressing Lower body dressing      What is the patient wearing?: Incontinence brief     Lower body assist Assist for lower body dressing: Maximal Assistance - Patient 25 - 49%     Toileting Toileting    Toileting assist Assist for toileting: Dependent - Patient 0%     Transfers Chair/bed transfer  Transfers assist  Chair/bed transfer activity did not occur: Safety/medical concerns        Locomotion Ambulation   Ambulation assist   Ambulation activity did not occur: Safety/medical concerns  Assist level: 2  helpers Assistive device: Other (comment) (recliner) Max distance: 104ft   Walk 10 feet activity   Assist  Walk 10 feet activity did not occur: Safety/medical concerns  Assist level: 2 helpers (+2 for safety with bari recliner) Assistive device: Other (comment) (recliner)   Walk 50 feet activity   Assist Walk 50 feet with 2 turns activity did not occur: Safety/medical concerns         Walk 150 feet activity   Assist Walk 150 feet activity did not occur: Safety/medical concerns         Walk 10 feet on uneven surface  activity   Assist Walk 10 feet on uneven surfaces activity did not occur: Safety/medical concerns         Wheelchair     Assist Will patient use wheelchair at discharge?: Yes Type of Wheelchair: Manual Wheelchair activity did not occur: Safety/medical concerns         Wheelchair 50 feet with 2 turns activity    Assist    Wheelchair 50 feet with 2 turns activity did not occur: Safety/medical concerns       Wheelchair 150 feet activity     Assist  Wheelchair 150 feet activity did not occur: Safety/medical concerns       Blood pressure 139/74, pulse 90, temperature 98.9 F (37.2 C), temperature source Oral, resp. rate 16, height 5\' 10"  (1.778 m), weight (!) 239.7 kg, SpO2 100 %.  Medical Problem List and Plan: 1.  Debility secondary to septic shock from necrotizing soft tissue infection of perianal, gluteal, and perineal tissues.  3/16- will try ACE wrap to stabilize R knee buckling-  3/17- ACE wrap slipped down- con't PT and OT for working esp on walking/transfers and ADLs    Continue CIR 2.  Antithrombotics: -DVT/anticoagulation:  Pharmaceutical:   Xarelto  3/15- pt doesn't want Coumadin and wants to continue on Xarelto- after prolonged d/w pt.              -antiplatelet therapy: N/A 3. Pain Management:  Continue Lyrica and elavil for neuropathy.              Oxycontin 15 mg bid with 10 mg prn for breakthrough pain.    Added Robaxin 500 mg QID prn for muscle spasms.    3/14- no IV anymore- doing ok with PO meds for dressing changes- con't regimen  3/15- getting the PO Dilaudid for wound care- otherwise, pain controlled  3/16- changed robaxin to 750 mg q6 hours prn  3/17- was helpful to increase Robaxin- con't regimen 4. Mood: LCSW to follow for evaluation and support.              -antipsychotic agents: N/A 5. Neuropsych: This patient is capable of making decisions on his own behalf. 6. Skin/Wound Care: Air mattress for pressure relief measures.              --  will need local care frequently to keep sacral wound bed clean.             --Continue foley for now.  3/10- con't sacral wound care daily and prn  Con't low air loss mattress and WOC note in chart- isn't pressure- is post op wound  3/14- wound looks better- edges closing in by granulation and no slough con't wound care and foley due to high risk of MASD  3/16- change wound care to BID AND prn  3/17- worked better for pt- to have wound care scheduled and prn- con't regimen 7. Fluids/Electrolytes/Nutrition: Monitor I/Os.  8. Necrotizing fascitis: Has been on Augmentin since 02/11--> to continue for 6 months per ID 9. T2DM with neuropathy: Hgb A1c- 9.6 (down from 13 in Aug) Will continue to monitor BS ac/hs and use SSI for elevated BS             --used Farxiga, metformin and Victoza 1.8.             --Continue Levemir bid and titrate as indicated  3/14- 99-129- well controlled- con't regimen  3/15- BGs great- almost too good- 78-104 in last 24 hours- if goes down any more, might need to back off- also, will have nurse decrease AM dose of Novolog to 9 units, not 18 units due to BG of 83 and no carbs for breakfast.   3/16- will decrease Novolog to 10 units TID with meals- since BGs 82-124 in last 24 hours  3/17- had episode where they had to give him juice x2 to bring BG up- will reduce Levemir to 40 units BID and con't reduced Novolg- A1c is 5.9-  MUCH improved- con't to monitor/manage 10 Hyponatremia: Likely due to multiple BM yesterday.              Sodium 138 on 3/10, continue to monitor 11. Hypokalemia:    Potassium 3.9 on 3/10 after supplementation 12. Proximal A FIb: Monitor HR tid--continue Cardizem 240 mg and amiodarone for rate control.              --monitor for symptoms with increase in activity.  3/16- HR 99 this AM- will monitor for RVR- esp with activity   3/17- HR back down to low 90s this AM- con't to monitor 13. OIC:              Discontinued colace to help bulk stools as now having loose stools. 14. Chronic right heel ulcer: Continue to paint wound daily with iodine.   3/16- is Stage II- healing 15.  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 Will need bariatric equipment.  3/14- BMI down to 74.68  3/16- BMI down to 74. 12  3/17- BMI up to 75.82 this AM- still lower than admission, but not sure why would have changed 'so fast"- scale off? 16. Trach site  3/9- healing- but leave open to air.  3/11- almost completely healed- scab coming off- and healthy tissue   underneath.  3/15- resolved/healed 17. Foley for urinary retention  Plan to DC on Monday  3/14-  Will keep Foley- got ok by head nurse, because he has such vast post op wounds from necrotizing fasciitis- con't for now  3/17- will con't foley since has such open wounds- to prevent additonal skin breakdown 18. Loose stools  3/14- rectum free floating- will d/c Miralax and add Fibercon  3/15- stools better this AM- more firm with fibercon- will con't regimen 19. Disposition:  at baseline, patient received  significant assistance from his mother, aged 49. He required her assistance to wipe stool after BM due to body habitus. He was able to ambulate to bathroom and back on his own. Plan is for discharge home to father, who is a IT sales professionalfirefighter, and who can provide more physical support to patient. Goal is ModA level for ambulation within his room. Plan for  approx 2-3 week stay to reach this goal. He is currently demonstrating improved standing tolerance. Fuller SongLaura AC discussed with father.       LOS: 9 days A FACE TO FACE EVALUATION WAS PERFORMED  Jaidyn Usery 08/15/2020, 1:17 PM

## 2020-08-15 NOTE — Progress Notes (Signed)
On rounds pt is stating even though he had recent wound care pt has soiled his dressing again. This nurse provided pt with oxycodone prior to wound care as pt had just recently rec'd dilaudid before previous wound care. 30 minutes after pt had wound care provided by primary nurse and charge nurse. During wound care pt asked to leave his wound open during the night to "air it out". This nurse instructed pt that airing out wound would not be beneficial to the wound. During wound care pt complains of "poop pain". When asked pt describes the pain as "needing to poop but can't". Pt given some time before redressing wound to have a BM .Marland KitchenMarland Kitchen

## 2020-08-16 ENCOUNTER — Inpatient Hospital Stay (HOSPITAL_COMMUNITY): Payer: Medicaid Other

## 2020-08-16 LAB — GLUCOSE, CAPILLARY
Glucose-Capillary: 125 mg/dL — ABNORMAL HIGH (ref 70–99)
Glucose-Capillary: 118 mg/dL — ABNORMAL HIGH (ref 70–99)
Glucose-Capillary: 130 mg/dL — ABNORMAL HIGH (ref 70–99)
Glucose-Capillary: 112 mg/dL — ABNORMAL HIGH (ref 70–99)

## 2020-08-16 MED ORDER — POLYETHYLENE GLYCOL 3350 17 GM/SCOOP PO POWD
1.0000 | Freq: Once | ORAL | Status: AC
  Administered 2020-08-16: 255 g via ORAL
  Filled 2020-08-16: qty 255

## 2020-08-16 NOTE — Progress Notes (Signed)
Patient transferred off of unit for KUB by transport. Cletis Media, LPN

## 2020-08-16 NOTE — Progress Notes (Signed)
PROGRESS NOTE   Subjective/Complaints:  Pt having severe abd pain due to what pt feel is "bowel obstruction"- he's "having the same pains he had last time"-  Also didn't sleep well as a result and took pain meds this AM, which likely made things worse, as I told pt.   KUB done STAT- shows very large retained stool burden- but no ileus or bowel obstruction.   Also described cramping in abd and bottom.   Used Nustep yesterday- walked 5x yesterday.   ROS:   Pt denies SOB, abd pain, CP, , and vision changes   Objective:   DG Abd 1 View  Result Date: 08/16/2020 CLINICAL DATA:  50 year old male with abdominal pain and constipation. Query obstruction. EXAM: ABDOMEN - 1 VIEW COMPARISON:  CT Abdomen and Pelvis 08/02/2020 and earlier. FINDINGS: Five AP supine views of the abdomen and pelvis demonstrate a large volume of retained stool throughout the colon similar to the recent CT. Gas-filled nondilated small bowel in the abdomen, nonobstructed pattern. Stable surgical clips in the left upper quadrant. No acute osseous abnormality identified. IMPRESSION: Non obstructed bowel gas pattern. Retained stool throughout the colon not improved since 08/02/2020 CT. Electronically Signed   By: Odessa Fleming M.D.   On: 08/16/2020 09:30   No results for input(s): WBC, HGB, HCT, PLT in the last 72 hours. No results for input(s): NA, K, CL, CO2, GLUCOSE, BUN, CREATININE, CALCIUM in the last 72 hours.  Intake/Output Summary (Last 24 hours) at 08/16/2020 1108 Last data filed at 08/16/2020 0818 Gross per 24 hour  Intake 500 ml  Output 5800 ml  Net -5300 ml     Pressure Injury 08/06/20 Heel Right Stage 2 -  Partial thickness loss of dermis presenting as a shallow open injury with a red, pink wound bed without slough. (Active)  08/06/20 0240  Location: Heel  Location Orientation: Right  Staging: Stage 2 -  Partial thickness loss of dermis presenting as a  shallow open injury with a red, pink wound bed without slough.  Wound Description (Comments):   Present on Admission: Yes    Physical Exam: Vital Signs Blood pressure 136/80, pulse 89, temperature 98.9 F (37.2 C), resp. rate 18, height 5\' 10"  (1.778 m), weight (!) 239.7 kg, SpO2 94 %.    General: awake, alert, appropriate, laying supine on low air loss bariatric bed, appears in pain; grabbing abdomen;NAD HENT: conjugate gaze; oropharynx moist CV: regular rate and rhythm; no JVD Pulmonary: CTA B/L; no W/R/R- good air movement GI: soft, but hard to  tell if distended; is protuberant; c/o TTP- no rebound; hypoactive BS, but no tinkling Psychiatric: appropriate, but c/ pain Neurological: Ox3 Skin: Warm and dry. Has foley- also has extremely large post op wound seen in pictures as of this AM- red/pink- side granulating in edges, no slough seen  Has a mildly erythematous spot on R anterior mid calf- not associated with anything- is not warm to touch. - took dressing off- tore skin very slightly- when removed slowly Left 2nd toe- clean- no dried blood- just a dressing - no change Musc: No edema in extremities.  No tenderness in extremities. Musculoskeletal: Bilateral upper extremities: 5/5 throughout  Bilateral lower extremities: Hip flexion 4+/5, distally 5/5   Assessment/Plan: 1. Functional deficits which require 3+ hours per day of interdisciplinary therapy in a comprehensive inpatient rehab setting.  Physiatrist is providing close team supervision and 24 hour management of active medical problems listed below.  Physiatrist and rehab team continue to assess barriers to discharge/monitor patient progress toward functional and medical goals  Care Tool:  Bathing    Body parts bathed by patient: Right arm,Left arm,Chest,Abdomen,Face,Right upper leg,Left upper leg   Body parts bathed by helper: Front perineal area,Buttocks,Right lower leg,Left lower leg     Bathing assist Assist  Level: Maximal Assistance - Patient 24 - 49%     Upper Body Dressing/Undressing Upper body dressing   What is the patient wearing?: Pull over shirt    Upper body assist Assist Level: Supervision/Verbal cueing    Lower Body Dressing/Undressing Lower body dressing      What is the patient wearing?: Incontinence brief     Lower body assist Assist for lower body dressing: Maximal Assistance - Patient 25 - 49%     Toileting Toileting    Toileting assist Assist for toileting: Dependent - Patient 0%     Transfers Chair/bed transfer  Transfers assist  Chair/bed transfer activity did not occur: Safety/medical concerns        Locomotion Ambulation   Ambulation assist   Ambulation activity did not occur: Safety/medical concerns  Assist level: 2 helpers Assistive device: Other (comment) (recliner) Max distance: 5810ft   Walk 10 feet activity   Assist  Walk 10 feet activity did not occur: Safety/medical concerns  Assist level: 2 helpers (+2 for safety with bari recliner) Assistive device: Other (comment) (recliner)   Walk 50 feet activity   Assist Walk 50 feet with 2 turns activity did not occur: Safety/medical concerns         Walk 150 feet activity   Assist Walk 150 feet activity did not occur: Safety/medical concerns         Walk 10 feet on uneven surface  activity   Assist Walk 10 feet on uneven surfaces activity did not occur: Safety/medical concerns         Wheelchair     Assist Will patient use wheelchair at discharge?: Yes Type of Wheelchair: Manual Wheelchair activity did not occur: Safety/medical concerns         Wheelchair 50 feet with 2 turns activity    Assist    Wheelchair 50 feet with 2 turns activity did not occur: Safety/medical concerns       Wheelchair 150 feet activity     Assist  Wheelchair 150 feet activity did not occur: Safety/medical concerns       Blood pressure 136/80, pulse 89,  temperature 98.9 F (37.2 C), resp. rate 18, height 5\' 10"  (1.778 m), weight (!) 239.7 kg, SpO2 94 %.  Medical Problem List and Plan: 1.  Debility secondary to septic shock from necrotizing soft tissue infection of perianal, gluteal, and perineal tissues.  3/16- will try ACE wrap to stabilize R knee buckling-  3/17- ACE wrap slipped down- con't PT and OT for working esp on walking/transfers and ADLs    3/18- pt likely cannot work with PT and OT today due to needing to get cleaned out- full of stool- restart therapy tomorrow.   Continue CIR 2.  Antithrombotics: -DVT/anticoagulation:  Pharmaceutical:   Xarelto  3/15- pt doesn't want Coumadin and wants to continue on Xarelto- after prolonged d/w pt.              -  antiplatelet therapy: N/A 3. Pain Management:  Continue Lyrica and elavil for neuropathy.              Oxycontin 15 mg bid with 10 mg prn for breakthrough pain.   Added Robaxin 500 mg QID prn for muscle spasms.    3/14- no IV anymore- doing ok with PO meds for dressing changes- con't regimen  3/15- getting the PO Dilaudid for wound care- otherwise, pain controlled  3/16- changed robaxin to 750 mg q6 hours prn  3/17- was helpful to increase Robaxin- con't regimen 4. Mood: LCSW to follow for evaluation and support.              -antipsychotic agents: N/A 5. Neuropsych: This patient is capable of making decisions on his own behalf. 6. Skin/Wound Care: Air mattress for pressure relief measures.              --will need local care frequently to keep sacral wound bed clean.             --Continue foley for now.  3/10- con't sacral wound care daily and prn  Con't low air loss mattress and WOC note in chart- isn't pressure- is post op wound  3/14- wound looks better- edges closing in by granulation and no slough con't wound care and foley due to high risk of MASD  3/16- change wound care to BID AND prn  3/17- worked better for pt- to have wound care scheduled and prn- con't regimen 7.  Fluids/Electrolytes/Nutrition: Monitor I/Os.  8. Necrotizing fascitis: Has been on Augmentin since 02/11--> to continue for 6 months per ID 9. T2DM with neuropathy: Hgb A1c- 9.6 (down from 13 in Aug) Will continue to monitor BS ac/hs and use SSI for elevated BS             --used Farxiga, metformin and Victoza 1.8.             --Continue Levemir bid and titrate as indicated  3/14- 99-129- well controlled- con't regimen  3/15- BGs great- almost too good- 78-104 in last 24 hours- if goes down any more, might need to back off- also, will have nurse decrease AM dose of Novolog to 9 units, not 18 units due to BG of 83 and no carbs for breakfast.   3/16- will decrease Novolog to 10 units TID with meals- since BGs 82-124 in last 24 hours  3/17- had episode where they had to give him juice x2 to bring BG up- will reduce Levemir to 40 units BID and con't reduced Novolg- A1c is 5.9- MUCH improved- con't to monitor/manage  3/18- BGs 114-143- doing well con't regimen and monitor 10 Hyponatremia: Likely due to multiple BM yesterday.              Sodium 138 on 3/10, continue to monitor 11. Hypokalemia:    Potassium 3.9 on 3/10 after supplementation 12. Proximal A FIb: Monitor HR tid--continue Cardizem 240 mg and amiodarone for rate control.              --monitor for symptoms with increase in activity.  3/16- HR 99 this AM- will monitor for RVR- esp with activity   3/17- HR back down to low 90s this AM- con't to monitor 13. OIC:              Discontinued colace to help bulk stools as now having loose stools.  3/18- pt now FULL of stool- likely has a hard stool that's "blocking' form  above, but doesn't have actual bowel obstruction- will do colon cleanse after d/w Pam and get him cleaned out! 14. Chronic right heel ulcer: Continue to paint wound daily with iodine.   3/16- is Stage II- healing 15.  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 Will need bariatric equipment.  3/14- BMI down to  74.68  3/16- BMI down to 74. 12  3/17- BMI up to 75.82 this AM- still lower than admission, but not sure why would have changed 'so fast"- scale off? 16. Trach site  3/9- healing- but leave open to air.  3/11- almost completely healed- scab coming off- and healthy tissue   underneath.  3/15- resolved/healed 17. Foley for urinary retention  Plan to DC on Monday  3/14-  Will keep Foley- got ok by head nurse, because he has such vast post op wounds from necrotizing fasciitis- con't for now  3/17- will con't foley since has such open wounds- to prevent additonal skin breakdown 18. Loose stools  3/14- rectum free floating- will d/c Miralax and add Fibercon  3/15- stools better this AM- more firm with fibercon- will con't regimen  3/18- actually backed up- will give Colon cleanse to get cleaned out- is FULL of stool- no bowel obstruction or ileus- no pain meds- will make things worse.  19. Disposition:  at baseline, patient received significant assistance from his mother, aged 84. He required her assistance to wipe stool after BM due to body habitus. He was able to ambulate to bathroom and back on his own. Plan is for discharge home to father, who is a IT sales professional, and who can provide more physical support to patient. Goal is ModA level for ambulation within his room. Plan for approx 2-3 week stay to reach this goal. He is currently demonstrating improved standing tolerance. Fuller Song discussed with father.       LOS: 10 days A FACE TO FACE EVALUATION WAS PERFORMED  John Vasconcelos 08/16/2020, 11:08 AM

## 2020-08-16 NOTE — Progress Notes (Signed)
Occupational Therapy Session Note  Patient Details  Name: Carl Hill MRN: [id number] Date of Birth: Mar 17, 1971  Today's Date: 08/16/2020 OT Missed Time: 60 Minutes Missed Time Reason: X-Ray   Short Term Goals: Week 2:  OT Short Term Goal 1 (Week 2): Patient will complete sit<>stand with max A +2 in prep for transfer OT Short Term Goal 2 (Week 2): Pt will complete LB bathing tasks with max A sitting EOB using AE PRN OT Short Term Goal 3 (Week 2): Pt will don socks with max A using AE PRN  Skilled Therapeutic Interventions/Progress Updates:   At start of OT session at 830, transport in room with pt about to take him to xray. Pt stated he was having abdominal pain.  I informed pt that I would see if I could reschedule him for later before noon, but unfortunately I was unable to do that.    Pt returned from xray at 915, this would only allow 15 minutes of treatment which is not enough time.    Therapy Documentation Precautions:  Precautions Precautions: Fall Precaution Comments: foley catheter, large buttocks wound, R heel wound Restrictions Weight Bearing Restrictions: No   Pain: pt c/o abdominal pain    ADL: ADL Eating: Set up Grooming: Minimal assistance Upper Body Bathing: Moderate assistance Lower Body Bathing: Dependent Upper Body Dressing: Maximal assistance Lower Body Dressing: Dependent Toileting: Dependent Toilet Transfer: Unable to assess Walk-In Shower Transfer: Unable to assess   Therapy/Group: Individual Therapy  SAGUIER,JULIA 08/16/2020, 8:48 AM

## 2020-08-16 NOTE — Plan of Care (Signed)
  Problem: Consults Goal: Skin Care Protocol Initiated - if Braden Score 18 or less Description: WOC following patient Pt will understand plan of care for wounds Outcome: Progressing Goal: Diabetes Guidelines if Diabetic/Glucose > 140 Description: If diabetic or lab glucose is > 140 mg/dl - Initiate Diabetes/Hyperglycemia Guidelines & Document Interventions  Pt will understand diet, medications, and need for f/u care with min cues Outcome: Progressing   Problem: RH BOWEL ELIMINATION Goal: RH STG MANAGE BOWEL WITH ASSISTANCE Description: STG Manage Bowel with min Assistance. Outcome: Progressing   Problem: RH BLADDER ELIMINATION Goal: RH STG MANAGE BLADDER WITH EQUIPMENT WITH ASSISTANCE Description: STG Manage Bladder With  foley catheter Equipment With min Assistance Outcome: Progressing   Problem: RH SKIN INTEGRITY Goal: RH STG SKIN FREE OF INFECTION/BREAKDOWN Description: Pt will have improvement of current wounds no further breakdown or infection while on rehab with min assist Outcome: Progressing Goal: RH STG MAINTAIN SKIN INTEGRITY WITH ASSISTANCE Description: STG Maintain Skin Integrity With min Assistance. Outcome: Progressing Goal: RH STG ABLE TO PERFORM INCISION/WOUND CARE W/ASSISTANCE Description: STG Able To Perform Incision/Wound Care With max Assistance. Outcome: Progressing   Problem: RH SAFETY Goal: RH STG ADHERE TO SAFETY PRECAUTIONS W/ASSISTANCE/DEVICE Description: STG Adhere to Safety Precautions With min Assistance/Device. Outcome: Progressing Goal: RH STG DECREASED RISK OF FALL WITH ASSISTANCE Description: STG Decreased Risk of Fall With min Assistance. Outcome: Progressing   Problem: RH PAIN MANAGEMENT Goal: RH STG PAIN MANAGED AT OR BELOW PT'S PAIN GOAL Description: Pt will have pain that is <= 4/10 Outcome: Progressing   Problem: RH KNOWLEDGE DEFICIT GENERAL Goal: RH STG INCREASE KNOWLEDGE OF SELF CARE AFTER HOSPITALIZATION Description: Patient  will demonstrate knowledge of medication management, dietary management, diabetes management, skin/wound care management with educational materials and handouts provided by staff. Outcome: Progressing   Problem: Consults Goal: RH GENERAL PATIENT EDUCATION Description: See Patient Education module for education specifics. Outcome: Progressing   

## 2020-08-16 NOTE — Progress Notes (Signed)
Occupational Therapy Session Note  Patient Details  Name: Carl Hill MRN: [id number] Date of Birth: 1971/02/25  Today's Date: 08/16/2020 OT Individual Time: 1330-1430 OT Individual Time Calculation (min): 60 min   Short Term Goals: Week 2:  OT Short Term Goal 1 (Week 2): Patient will complete sit<>stand with max A +2 in prep for transfer OT Short Term Goal 2 (Week 2): Pt will complete LB bathing tasks with max A sitting EOB using AE PRN OT Short Term Goal 3 (Week 2): Pt will don socks with max A using AE PRN  Skilled Therapeutic Interventions/Progress Updates:    Pt greeted semi-reclined in bed and agreeable to OT treatment session despite having continued abdominal pain which patient reports as "poop pain." Pt stated it feels like it did when he was impacted a few weeks ago. Pt with intermittent bouts of pain that make him stop talking and shake all over. Discussed how we would complete a BSC transfer using bariatric BSC and bariatric shuttle chair to stand, then place [facility] in front of patient. This maneuver would take more than one person and patient did not want to attempt at this time. OT obtained bariatric BSC with weight limit of 1000 lbs and brought to room. OT set-up BSC to height. Pt then agreeable to sit up for short time. Pt completed bed mobility with min A. Pt agreeable to try sit<>stand with bariatric RW. Pt able to stand with min A pulling up on RW. OT steadying RW and watching R knee for possible buckle. Pt able to take small side steps along EOB. Pt returned to sitting to rest, then completed a second stand and side steps in similar fashion. Pt returned to bed at end of session and left semi-reclined in bed with bed alarm on, call bell in reach, and needs met .  Therapy Documentation Precautions:  Precautions Precautions: Fall Precaution Comments: foley catheter, large buttocks wound, R heel wound Restrictions Weight Bearing Restrictions: No Pain:   Pt reports abdominal  pain and buttocks pain. Rest and repositioned for comfort.  Therapy/Group: Individual Therapy  Valma Cava 08/16/2020, 2:15 PM

## 2020-08-16 NOTE — Progress Notes (Signed)
Physical Therapy Session Note  Patient Details  Name: Carl Hill MRN: [id number] Date of Birth: 11-20-1970  Today's Date: 08/16/2020 PT Missed Time: 60 Minutes Missed Time Reason: Pain  Short Term Goals: Week 2:  PT Short Term Goal 1 (Week 2): Pt will initiate gait training with RW. PT Short Term Goal 2 (Week 2): Pt will stand with RW x 2 minutes with min A x 2. PT Short Term Goal 3 (Week 2): Pt will tolerate >20 min of activity without incidence of pain.  Skilled Therapeutic Interventions/Progress Updates:     Pt reports debilitating stomach cramping and pain. Had just returned from CT to assess for cause of discomfort. PT will follow up as appropriate.  Therapy Documentation Precautions:  Precautions Precautions: Fall Precaution Comments: foley catheter, large buttocks wound, R heel wound Restrictions Weight Bearing Restrictions: No General: PT Amount of Missed Time (min): 60 Minutes PT Missed Treatment Reason: Pain   Therapy/Group: Individual Therapy  Beau Fanny, PT, DPT 08/16/2020, 4:23 PM

## 2020-08-16 NOTE — Progress Notes (Signed)
Patient ID: Carl Hill, male   DOB: 06-Mar-1971, 50 y.o.   MRN: [id number]  SW followed up with pt about HHA preference. States he will get this information from his mother. Pt expressed concerns about his current stomach pains and waiting on x-ray results, as well as, his mother's upcoming surgery (to be scheduled) to remove a polyp in stomach. SW and family have discussed that his mother will be in hospital for 2-5 days, and will not be able to physically assist him. Pt states that his father will assist PRN. SW will continue to check in with pt.   Cecile Sheerer, MSW, LCSWA Office: [phone] Cell: [phone] Fax: [phone]

## 2020-08-16 NOTE — Progress Notes (Signed)
Recreational Therapy Session Note  Patient Details  Name: Carl Hill MRN: [id number] Date of Birth: [birth year] Today's Date: 08/16/2020  Pain: pt c/o of stomach/bowel pain- MD/NSG aware and addressing Skilled Therapeutic Interventions/Progress Updates: Pt in bed upon arrival and with the above complaints.  Pt upset he is unable to participate and fearful he has obstruction as he describes feelings that are similar to previous bowel obstruction.  Emotional support provided.  Therapy/Group: Individual Therapy  [name] 08/16/2020, 11:52 AM

## 2020-08-17 LAB — GLUCOSE, CAPILLARY
Glucose-Capillary: 85 mg/dL (ref 70–99)
Glucose-Capillary: 124 mg/dL — ABNORMAL HIGH (ref 70–99)
Glucose-Capillary: 149 mg/dL — ABNORMAL HIGH (ref 70–99)
Glucose-Capillary: 93 mg/dL (ref 70–99)

## 2020-08-17 MED ORDER — HYDROCORTISONE (PERIANAL) 2.5 % EX CREA
TOPICAL_CREAM | Freq: Three times a day (TID) | CUTANEOUS | Status: DC
  Administered 2020-08-20 – 2020-08-31 (×2): 1 via RECTAL
  Filled 2020-08-17 (×2): qty 28.35

## 2020-08-17 NOTE — Progress Notes (Signed)
PROGRESS NOTE   Subjective/Complaints:  Belly feeling much better after large bm's. However having a lot of anal pain and pain near his large wound.   ROS: Patient denies fever, rash, sore throat, blurred vision, nausea, vomiting, diarrhea, cough, shortness of breath or chest pain, headache, or mood change.    Objective:   DG Abd 1 View  Result Date: 08/16/2020 CLINICAL DATA:  50 year old male with abdominal pain and constipation. Query obstruction. EXAM: ABDOMEN - 1 VIEW COMPARISON:  CT Abdomen and Pelvis 08/02/2020 and earlier. FINDINGS: Five AP supine views of the abdomen and pelvis demonstrate a large volume of retained stool throughout the colon similar to the recent CT. Gas-filled nondilated small bowel in the abdomen, nonobstructed pattern. Stable surgical clips in the left upper quadrant. No acute osseous abnormality identified. IMPRESSION: Non obstructed bowel gas pattern. Retained stool throughout the colon not improved since 08/02/2020 CT. Electronically Signed   By: Odessa Fleming M.D.   On: 08/16/2020 09:30   No results for input(s): WBC, HGB, HCT, PLT in the last 72 hours. No results for input(s): NA, K, CL, CO2, GLUCOSE, BUN, CREATININE, CALCIUM in the last 72 hours.  Intake/Output Summary (Last 24 hours) at 08/17/2020 1157 Last data filed at 08/17/2020 0867 Gross per 24 hour  Intake 640 ml  Output 2200 ml  Net -1560 ml     Pressure Injury 08/06/20 Heel Right Stage 2 -  Partial thickness loss of dermis presenting as a shallow open injury with a red, pink wound bed without slough. (Active)  08/06/20 0240  Location: Heel  Location Orientation: Right  Staging: Stage 2 -  Partial thickness loss of dermis presenting as a shallow open injury with a red, pink wound bed without slough.  Wound Description (Comments):   Present on Admission: Yes    Physical Exam: Vital Signs Blood pressure (!) 144/94, pulse (!) 104,  temperature 98 F (36.7 C), resp. rate 18, height 5\' 10"  (1.778 m), weight (!) 239.7 kg, SpO2 95 %.    Constitutional: groaning in pain . Vital signs reviewed. Morbidly obese HEENT: EOMI, oral membranes moist Neck: supple Cardiovascular: RRR without murmur. No JVD    Respiratory/Chest: CTA Bilaterally without wheezes or rales. Normal effort    GI/Abdomen: BS +, non-tender, non-distended Ext: no clubbing, cyanosis, or edema Psych: anxious but pleasant, cooperative Neurological: Ox3 Skin: Warm and dry. Has foley- also has extremely large post op wound seen in pictures as of this AM- red/pink- side granulating in edges. Minimal slough. Peri-anal irritation also. ?hemorrhoids Has a mildly erythematous spot on R anterior mid calf- not associated with anything- is not warm to touch. -   Left 2nd toe- clean- no dried blood- just a dressing - stable in appearance Musc: No edema in extremities.  No tenderness in extremities. Musculoskeletal: Bilateral upper extremities: 5/5 throughout Bilateral lower extremities: Hip flexion grossly 4+/5, distally 5/5   Assessment/Plan: 1. Functional deficits which require 3+ hours per day of interdisciplinary therapy in a comprehensive inpatient rehab setting.  Physiatrist is providing close team supervision and 24 hour management of active medical problems listed below.  Physiatrist and rehab team continue to assess barriers to discharge/monitor patient  progress toward functional and medical goals  Care Tool:  Bathing    Body parts bathed by patient: Right arm,Left arm,Chest,Abdomen,Face,Right upper leg,Left upper leg   Body parts bathed by helper: Front perineal area,Buttocks,Right lower leg,Left lower leg     Bathing assist Assist Level: Maximal Assistance - Patient 24 - 49%     Upper Body Dressing/Undressing Upper body dressing   What is the patient wearing?: Pull over shirt    Upper body assist Assist Level: Supervision/Verbal cueing     Lower Body Dressing/Undressing Lower body dressing      What is the patient wearing?: Incontinence brief     Lower body assist Assist for lower body dressing: Maximal Assistance - Patient 25 - 49%     Toileting Toileting    Toileting assist Assist for toileting: Dependent - Patient 0%     Transfers Chair/bed transfer  Transfers assist  Chair/bed transfer activity did not occur: Safety/medical concerns        Locomotion Ambulation   Ambulation assist   Ambulation activity did not occur: Safety/medical concerns  Assist level: 2 helpers Assistive device: Other (comment) (recliner) Max distance: 15ft   Walk 10 feet activity   Assist  Walk 10 feet activity did not occur: Safety/medical concerns  Assist level: 2 helpers (+2 for safety with bari recliner) Assistive device: Other (comment) (recliner)   Walk 50 feet activity   Assist Walk 50 feet with 2 turns activity did not occur: Safety/medical concerns         Walk 150 feet activity   Assist Walk 150 feet activity did not occur: Safety/medical concerns         Walk 10 feet on uneven surface  activity   Assist Walk 10 feet on uneven surfaces activity did not occur: Safety/medical concerns         Wheelchair     Assist Will patient use wheelchair at discharge?: Yes Type of Wheelchair: Manual Wheelchair activity did not occur: Safety/medical concerns         Wheelchair 50 feet with 2 turns activity    Assist    Wheelchair 50 feet with 2 turns activity did not occur: Safety/medical concerns       Wheelchair 150 feet activity     Assist  Wheelchair 150 feet activity did not occur: Safety/medical concerns       Blood pressure (!) 144/94, pulse (!) 104, temperature 98 F (36.7 C), resp. rate 18, height 5\' 10"  (1.778 m), weight (!) 239.7 kg, SpO2 95 %.  Medical Problem List and Plan: 1.  Debility secondary to septic shock from necrotizing soft tissue infection of  perianal, gluteal, and perineal tissues.  3/16- will try ACE wrap to stabilize R knee buckling-  3/17- ACE wrap slipped down- con't PT and OT for working esp on walking/transfers and ADLs    3/19 may participate with therapies as tolerated  -Continue CIR therapies including PT, OT  2.  Antithrombotics: -DVT/anticoagulation:  Pharmaceutical:   Xarelto  3/15- pt doesn't want Coumadin and wants to continue on Xarelto- after prolonged d/w pt.              -antiplatelet therapy: N/A 3. Pain Management:  Continue Lyrica and elavil for neuropathy.              Oxycontin 15 mg bid with 10 mg prn for breakthrough pain.   Added Robaxin 500 mg QID prn for muscle spasms.    3/14- no IV anymore- doing ok with  PO meds for dressing changes- con't regimen  3/15- getting the PO Dilaudid for wound care- otherwise, pain controlled  3/16- changed robaxin to 750 mg q6 hours prn  3/19 continue current regimen, add hemorrhoidal cream 4. Mood: LCSW to follow for evaluation and support.              -antipsychotic agents: N/A 5. Neuropsych: This patient is capable of making decisions on his own behalf. 6. Skin/Wound Care: Air mattress for pressure relief measures.              --will need local care frequently to keep sacral wound bed clean.             --Continue foley for now.  3/10- con't sacral wound care daily and prn  Con't low air loss mattress and WOC note in chart- isn't pressure- is post op wound  3/14- wound looks better- edges closing in by granulation and no slough con't wound care and foley due to high risk of MASD  3/16- change wound care to BID AND prn  3/17- worked better for pt- to have wound care scheduled and prn- con't regimen  3/19 will add hemorrhoidal cream to assist with any pain which might be ano-rectal/hemorrhoidal 7. Fluids/Electrolytes/Nutrition: Monitor I/Os.  8. Necrotizing fascitis: Has been on Augmentin since 02/11--> to continue for 6 months per ID 9. T2DM with neuropathy:  Hgb A1c- 9.6 (down from 13 in Aug) Will continue to monitor BS ac/hs and use SSI for elevated BS             --used Farxiga, metformin and Victoza 1.8.             --Continue Levemir bid and titrate as indicated  3/14- 99-129- well controlled- con't regimen  3/15- BGs great- almost too good- 78-104 in last 24 hours- if goes down any more, might need to back off- also, will have nurse decrease AM dose of Novolog to 9 units, not 18 units due to BG of 83 and no carbs for breakfast.   3/16- will decrease Novolog to 10 units TID with meals- since BGs 82-124 in last 24 hours  3/17- had episode where they had to give him juice x2 to bring BG up- will reduce Levemir to 40 units BID and con't reduced Novolg- A1c is 5.9- MUCH improved- con't to monitor/manage  3/19- CBG's well controlled. Continue same plan 10 Hyponatremia: Likely due to multiple BM yesterday.              Sodium 138 on 3/10, continue to monitor 11. Hypokalemia:    Potassium 3.9 on 3/10 after supplementation 12. Proximal A FIb: Monitor HR tid--continue Cardizem 240 mg and amiodarone for rate control.              --monitor for symptoms with increase in activity.  3/16- HR 99 this AM- will monitor for RVR- esp with activity   3/17- HR back down to low 90s this AM- con't to monitor 13. OIC:              Discontinued colace to help bulk stools as now having loose stools.  3/19 pt with multiple bm's after SSE--feels better 14. Chronic right heel ulcer: Continue to paint wound daily with iodine.   3/16- is Stage II- healing 15.  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 Will need bariatric equipment.  3/14- BMI down to 74.68  3/16- BMI down to 74. 12  3/17- BMI up to 75.82 this  AM- still lower than admission, but not sure why would have changed 'so fast"- scale off? 16. Trach site  3/9- healing- but leave open to air.  3/11- almost completely healed- scab coming off- and healthy tissue   underneath.  3/15-  resolved/healed 17. Foley for urinary retention  Plan to DC on Monday  3/14-  Will keep Foley- got ok by head nurse, because he has such vast post op wounds from necrotizing fasciitis- con't for now  3/19- will con't foley since has such open wounds- to prevent additonal skin breakdown  19. Disposition:  at baseline, patient received significant assistance from his mother, aged 2. He required her assistance to wipe stool after BM due to body habitus. He was able to ambulate to bathroom and back on his own. Plan is for discharge home to father, who is a IT sales professional, and who can provide more physical support to patient. Goal is ModA level for ambulation within his room. Plan for approx 2-3 week stay to reach this goal. He is currently demonstrating improved standing tolerance. Fuller Song discussed with father.       LOS: 11 days A FACE TO FACE EVALUATION WAS PERFORMED  Ranelle Oyster 08/17/2020, 11:57 AM

## 2020-08-17 NOTE — Progress Notes (Addendum)
Patient has had 2 large and 2 medium formed bowel movements this shift post soap suds enema given x1 . Wound dressing changed x4. C/o burning pain on anus. Stomach cramping not as bad per patient. Will continue to monitor.

## 2020-08-18 LAB — GLUCOSE, CAPILLARY
Glucose-Capillary: 100 mg/dL — ABNORMAL HIGH (ref 70–99)
Glucose-Capillary: 114 mg/dL — ABNORMAL HIGH (ref 70–99)
Glucose-Capillary: 103 mg/dL — ABNORMAL HIGH (ref 70–99)
Glucose-Capillary: 158 mg/dL — ABNORMAL HIGH (ref 70–99)

## 2020-08-18 NOTE — Plan of Care (Signed)
  Problem: Consults Goal: Skin Care Protocol Initiated - if Braden Score 18 or less Description: WOC following patient Pt will understand plan of care for wounds Outcome: Progressing Goal: Diabetes Guidelines if Diabetic/Glucose > 140 Description: If diabetic or lab glucose is > 140 mg/dl - Initiate Diabetes/Hyperglycemia Guidelines & Document Interventions  Pt will understand diet, medications, and need for f/u care with min cues Outcome: Progressing   Problem: RH BOWEL ELIMINATION Goal: RH STG MANAGE BOWEL WITH ASSISTANCE Description: STG Manage Bowel with min Assistance. Outcome: Progressing   Problem: RH BLADDER ELIMINATION Goal: RH STG MANAGE BLADDER WITH EQUIPMENT WITH ASSISTANCE Description: STG Manage Bladder With  foley catheter Equipment With min Assistance Outcome: Progressing   Problem: RH SKIN INTEGRITY Goal: RH STG SKIN FREE OF INFECTION/BREAKDOWN Description: Pt will have improvement of current wounds no further breakdown or infection while on rehab with min assist Outcome: Progressing Goal: RH STG MAINTAIN SKIN INTEGRITY WITH ASSISTANCE Description: STG Maintain Skin Integrity With min Assistance. Outcome: Progressing Goal: RH STG ABLE TO PERFORM INCISION/WOUND CARE W/ASSISTANCE Description: STG Able To Perform Incision/Wound Care With max Assistance. Outcome: Progressing   Problem: RH SAFETY Goal: RH STG ADHERE TO SAFETY PRECAUTIONS W/ASSISTANCE/DEVICE Description: STG Adhere to Safety Precautions With min Assistance/Device. Outcome: Progressing Goal: RH STG DECREASED RISK OF FALL WITH ASSISTANCE Description: STG Decreased Risk of Fall With min Assistance. Outcome: Progressing   Problem: RH PAIN MANAGEMENT Goal: RH STG PAIN MANAGED AT OR BELOW PT'S PAIN GOAL Description: Pt will have pain that is <= 4/10 Outcome: Progressing   Problem: RH KNOWLEDGE DEFICIT GENERAL Goal: RH STG INCREASE KNOWLEDGE OF SELF CARE AFTER HOSPITALIZATION Description: Patient  will demonstrate knowledge of medication management, dietary management, diabetes management, skin/wound care management with educational materials and handouts provided by staff. Outcome: Progressing   Problem: Consults Goal: RH GENERAL PATIENT EDUCATION Description: See Patient Education module for education specifics. Outcome: Progressing

## 2020-08-18 NOTE — Progress Notes (Signed)
PROGRESS NOTE   Subjective/Complaints: Bowels slowed down. Had large bm this morning. Ano-rectal area feels much better with anusol cream. Slept well. Stood in walker with therapy yesterday  ROS: Patient denies fever, rash, sore throat, blurred vision, nausea, vomiting, diarrhea, cough, shortness of breath or chest pain,  headache, or mood change.      Objective:   No results found. No results for input(s): WBC, HGB, HCT, PLT in the last 72 hours. No results for input(s): NA, K, CL, CO2, GLUCOSE, BUN, CREATININE, CALCIUM in the last 72 hours.  Intake/Output Summary (Last 24 hours) at 08/18/2020 1012 Last data filed at 08/18/2020 0900 Gross per 24 hour  Intake 880 ml  Output 4450 ml  Net -3570 ml     Pressure Injury 08/06/20 Heel Right Stage 2 -  Partial thickness loss of dermis presenting as a shallow open injury with a red, pink wound bed without slough. (Active)  08/06/20 0240  Location: Heel  Location Orientation: Right  Staging: Stage 2 -  Partial thickness loss of dermis presenting as a shallow open injury with a red, pink wound bed without slough.  Wound Description (Comments):   Present on Admission: Yes    Physical Exam: Vital Signs Blood pressure 105/83, pulse (!) 108, temperature 98.3 F (36.8 C), resp. rate 19, height 5\' 10"  (1.778 m), weight (!) 239.7 kg, SpO2 96 %.    Constitutional: No distress . Vital signs reviewed. Morbidly obese HEENT: EOMI, oral membranes moist Neck: supple Cardiovascular: RRR without murmur. No JVD    Respiratory/Chest: CTA Bilaterally without wheezes or rales. Normal effort    GI/Abdomen: BS +, non-tender, non-distended Ext: no clubbing, cyanosis, or edema Psych: pleasant and cooperative Neurological: Ox3 Skin: Warm and dry. Large sacral stage III wound with minimal slough.  Has a mildly erythematous spot on R anterior mid calf- not associated with anything- is not warm to  touch. -   Left 2nd toe- clean, really almost resolved. dressing removed Musc: No edema in extremities.  No tenderness in extremities. Musculoskeletal: Bilateral upper extremities: 5/5 throughout Bilateral lower extremities: Hip flexion grossly 4+/5, distally 5/5   Assessment/Plan: 1. Functional deficits which require 3+ hours per day of interdisciplinary therapy in a comprehensive inpatient rehab setting.  Physiatrist is providing close team supervision and 24 hour management of active medical problems listed below.  Physiatrist and rehab team continue to assess barriers to discharge/monitor patient progress toward functional and medical goals  Care Tool:  Bathing    Body parts bathed by patient: Right arm,Left arm,Chest,Abdomen,Face,Right upper leg,Left upper leg   Body parts bathed by helper: Front perineal area,Buttocks,Right lower leg,Left lower leg     Bathing assist Assist Level: Maximal Assistance - Patient 24 - 49%     Upper Body Dressing/Undressing Upper body dressing   What is the patient wearing?: Pull over shirt    Upper body assist Assist Level: Supervision/Verbal cueing    Lower Body Dressing/Undressing Lower body dressing      What is the patient wearing?: Incontinence brief     Lower body assist Assist for lower body dressing: Maximal Assistance - Patient 25 - 49%     Toileting Toileting  Toileting assist Assist for toileting: Dependent - Patient 0%     Transfers Chair/bed transfer  Transfers assist  Chair/bed transfer activity did not occur: Safety/medical concerns        Locomotion Ambulation   Ambulation assist   Ambulation activity did not occur: Safety/medical concerns  Assist level: 2 helpers Assistive device: Other (comment) (recliner) Max distance: 2ft   Walk 10 feet activity   Assist  Walk 10 feet activity did not occur: Safety/medical concerns  Assist level: 2 helpers (+2 for safety with bari recliner) Assistive  device: Other (comment) (recliner)   Walk 50 feet activity   Assist Walk 50 feet with 2 turns activity did not occur: Safety/medical concerns         Walk 150 feet activity   Assist Walk 150 feet activity did not occur: Safety/medical concerns         Walk 10 feet on uneven surface  activity   Assist Walk 10 feet on uneven surfaces activity did not occur: Safety/medical concerns         Wheelchair     Assist Will patient use wheelchair at discharge?: Yes Type of Wheelchair: Manual Wheelchair activity did not occur: Safety/medical concerns         Wheelchair 50 feet with 2 turns activity    Assist    Wheelchair 50 feet with 2 turns activity did not occur: Safety/medical concerns       Wheelchair 150 feet activity     Assist  Wheelchair 150 feet activity did not occur: Safety/medical concerns       Blood pressure 105/83, pulse (!) 108, temperature 98.3 F (36.8 C), resp. rate 19, height 5\' 10"  (1.778 m), weight (!) 239.7 kg, SpO2 96 %.  Medical Problem List and Plan: 1.  Debility secondary to septic shock from necrotizing soft tissue infection of perianal, gluteal, and perineal tissues.  3/16- will try ACE wrap to stabilize R knee buckling-  3/17- ACE wrap slipped down- con't PT and OT for working esp on walking/transfers and ADLs    3/19 may participate with therapies as tolerated  -Continue CIR therapies including PT, OT  2.  Antithrombotics: -DVT/anticoagulation:  Pharmaceutical:   Xarelto  3/15- pt doesn't want Coumadin and wants to continue on Xarelto- after prolonged d/w pt.              -antiplatelet therapy: N/A 3. Pain Management:  Continue Lyrica and elavil for neuropathy.              Oxycontin 15 mg bid with 10 mg prn for breakthrough pain.   Added Robaxin 500 mg QID prn for muscle spasms.    3/14- no IV anymore- doing ok with PO meds for dressing changes- con't regimen  3/15- getting the PO Dilaudid for wound care-  otherwise, pain controlled  3/16- changed robaxin to 750 mg q6 hours prn  3/20 pain improved. Continue anusol for ano-rectal discomfort 4. Mood: LCSW to follow for evaluation and support.              -antipsychotic agents: N/A 5. Neuropsych: This patient is capable of making decisions on his own behalf. 6. Skin/Wound Care: Air mattress for pressure relief measures.              --will need local care frequently to keep sacral wound bed clean.             --Continue foley for now.  3/10- con't sacral wound care daily and prn  Con't low air loss mattress and WOC note in chart- isn't pressure- is post op wound  3/14- wound looks better- edges closing in by granulation and no slough con't wound care and foley due to high risk of MASD  3/16- change wound care to BID AND prn  3/17- worked better for pt- to have wound care scheduled and prn- con't regimen  3/19 added hemorrhoidal cream to assist with any pain which might be ano-rectal/hemorrhoidal 7. Fluids/Electrolytes/Nutrition: Monitor I/Os.  8. Necrotizing fascitis: Has been on Augmentin since 02/11--> to continue for 6 months per ID 9. T2DM with neuropathy: Hgb A1c- 9.6 (down from 13 in Aug) Will continue to monitor BS ac/hs and use SSI for elevated BS             --used Farxiga, metformin and Victoza 1.8.             --Continue Levemir bid and titrate as indicated  3/14- 99-129- well controlled- con't regimen  3/15- BGs great- almost too good- 78-104 in last 24 hours- if goes down any more, might need to back off- also, will have nurse decrease AM dose of Novolog to 9 units, not 18 units due to BG of 83 and no carbs for breakfast.   3/16- will decrease Novolog to 10 units TID with meals- since BGs 82-124 in last 24 hours  3/17- had episode where they had to give him juice x2 to bring BG up- will reduce Levemir to 40 units BID and con't reduced Novolg- A1c is 5.9- MUCH improved- con't to monitor/manage  3/19-20: CBG's well controlled. Continue  same plan 10 Hyponatremia: Likely due to multiple BM yesterday.              Sodium 138 on 3/10, continue to monitor 11. Hypokalemia:    Potassium 3.9 on 3/10 after supplementation 12. Proximal A FIb: Monitor HR tid--continue Cardizem 240 mg and amiodarone for rate control.              --monitor for symptoms with increase in activity.  3/16- HR 99 this AM- will monitor for RVR- esp with activity   3/17- HR back down to low 90s this AM- con't to monitor 13. OIC:              Discontinued colace to help bulk stools as now having loose stools.  3/20 moving bowels regularly now 14. Chronic right heel ulcer: Continue to paint wound daily with iodine.   3/16- is Stage II- healing 15.  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 Will need bariatric equipment.  3/14- BMI down to 74.68  3/16- BMI down to 74. 12  3/17- BMI up to 75.82 this AM- still lower than admission, but not sure why would have changed 'so fast"- scale off? 16. Trach site  3/9- healing- but leave open to air.  3/11- almost completely healed- scab coming off- and healthy tissue   underneath.  3/15- resolved/healed 17. Foley for urinary retention  Plan to DC on Monday  3/14-  Will keep Foley- got ok by head nurse, because he has such vast post op wounds from necrotizing fasciitis- con't for now  3/19- will con't foley since has such open wounds- to prevent additonal skin breakdown  19. Disposition:  at baseline, patient received significant assistance from his mother, aged 36. He required her assistance to wipe stool after BM due to body habitus. He was able to ambulate to bathroom and back on his own. Plan is for  discharge home to father, who is a IT sales professionalfirefighter, and who can provide more physical support to patient. Goal is ModA level for ambulation within his room. Plan for approx 2-3 week stay to reach this goal. He is currently demonstrating improved standing tolerance. Fuller SongLaura AC discussed with father.        LOS: 12 days A FACE TO FACE EVALUATION WAS PERFORMED  Ranelle OysterZachary T Kylin Dubs 08/18/2020, 10:12 AM

## 2020-08-18 NOTE — Progress Notes (Signed)
Physical Therapy Session Note  Patient Details  Name: Carl Hill MRN: [id number] Date of Birth: 02/25/1971  Today's Date: 08/18/2020 PT Individual Time: 1300-1410 PT Individual Time Calculation (min): 70 min   Short Term Goals: Week 2:  PT Short Term Goal 1 (Week 2): Pt will initiate gait training with RW. PT Short Term Goal 2 (Week 2): Pt will stand with RW x 2 minutes with min A x 2. PT Short Term Goal 3 (Week 2): Pt will tolerate >20 min of activity without incidence of pain.  Skilled Therapeutic Interventions/Progress Updates:   Pt received supine in bed and agreeable to therapy. Supine>EOB with supervision and use of bed features. Pt performed STS x 3 with RW and min A-CGA to block knees, assist of 3. Pt was performing marches with RW when his knees buckled. He had an assisted fall to his R hip and back. Pt returned to EOB with maxi move. Vitals taken in sitting, HR=125 BPM, BP=100/83. Pt c/o some R sided low back pain, but no spasms and no other pain after fall. Pt had no complaint of of dizziness or lightheadedness before or after falling. Visual skin inspection revealed small abrasion on R shin which was reported to nursing. Pt stood to bari recliner x 3 and was able to take side steps toward the head of bed. Pt returned to supine with supervision and use of bed features and was left with all needs in reach.   Therapy Documentation Precautions:  Precautions Precautions: Fall Precaution Comments: foley catheter, large buttocks wound, R heel wound Restrictions Weight Bearing Restrictions: No   Therapy/Group: Individual Therapy  Dyane Dustman, SPT 08/18/2020, 4:02 PM

## 2020-08-18 NOTE — Progress Notes (Signed)
Occupational Therapy Session Note  Patient Details  Name: Carl Hill MRN: [id number] Date of Birth: 03/14/1971  Today's Date: 08/18/2020 OT Individual Time: 3612-2449 OT Individual Time Calculation (min): 45 min    Short Term Goals: Week 1:  OT Short Term Goal 1 (Week 1): Patient will tolerate sitting EOB for 15 minutes in preparation for BADL task OT Short Term Goal 1 - Progress (Week 1): Met OT Short Term Goal 2 (Week 1): Patient will complete sit<>stand with max A +2 in prep for transfer OT Short Term Goal 2 - Progress (Week 1): Progressing toward goal OT Short Term Goal 3 (Week 1): Patient will complete 50% of U Bbathing task while seated EOB. OT Short Term Goal 3 - Progress (Week 1): Met Week 2:  OT Short Term Goal 1 (Week 2): Patient will complete sit<>stand with max A +2 in prep for transfer OT Short Term Goal 2 (Week 2): Pt will complete LB bathing tasks with max A sitting EOB using AE PRN OT Short Term Goal 3 (Week 2): Pt will don socks with max A using AE PRN   Skilled Therapeutic Interventions/Progress Updates:   Pt greeted at time of session supine in bed resting agreeable to OT session. Pt at beginning of session recounting events that led to hospitalization and recent impaction/bowel problems, hyperverbal and requiring frequent cues to redirection. Note pt able to direct his care throughout session. Supine > sit Supervision with bed features. At this time RN entered for med pass and pt sat EOB with unilateral support throughout. Performed oral hygiene and mouth rinse sitting EOB set up. Sit <> stands x2 CGA at [place] and on second attempt able to side step short distance toward [facility] CGA. Sit > supine Supervision and scoot up in bed with features. Pt in bed resting with call bell in reach all needs met.    Therapy Documentation Precautions:  Precautions Precautions: Fall Precaution Comments: foley catheter, large buttocks wound, R heel wound Restrictions Weight  Bearing Restrictions: No     Therapy/Group: Individual Therapy  Viona Gilmore 08/18/2020, 9:17 AM

## 2020-08-18 NOTE — Progress Notes (Signed)
   08/18/20 1339  What Happened  Was fall witnessed? Yes  Who witnessed fall? PT therapy  Patients activity before fall ambulating-assisted  Was patient injured? No  Follow Up  MD notified yes  Time MD notified 1344  Family notified Yes - comment  Time family notified 1346  Progress note created (see row info) Yes  Adult Fall Risk Assessment  Risk Factor Category (scoring not indicated) Fall has occurred during this admission (document High fall risk)  Age 50  Fall History: Fall within 6 months prior to admission 5  Elimination; Bowel and/or Urine Incontinence 2  Elimination; Bowel and/or Urine Urgency/Frequency 0  Medications: includes PCA/Opiates, Anti-convulsants, Anti-hypertensives, Diuretics, Hypnotics, Laxatives, Sedatives, and Psychotropics 5  Patient Care Equipment 1  Mobility-Assistance 2  Mobility-Gait 2  Mobility-Sensory Deficit 0  Altered awareness of immediate physical environment 0  Impulsiveness 0  Lack of understanding of one's physical/cognitive limitations 0  Total Score 17  Patient Fall Risk Level High fall risk  Adult Fall Risk Interventions  Required Bundle Interventions *See Row Information* High fall risk - low, moderate, and high requirements implemented  Additional Interventions Lap belt while in chair/wheelchair (Rehab only);PT/OT need assessed if change in mobility from baseline;Use of appropriate toileting equipment (bedpan, BSC, etc.)  Screening for Fall Injury Risk (To be completed on HIGH fall risk patients) - Assessing Need for Floor Mats  Risk For Fall Injury- Criteria for Floor Mats Previous fall this admission  Will Implement Floor Mats Yes  Vitals  Temp 99.7 F (37.6 C)  Temp Source Oral  BP 100/83  MAP (mmHg) 90  BP Location Left Arm  BP Method Automatic  Patient Position (if appropriate) Sitting  Pulse Rate 98  Resp 19  Oxygen Therapy  SpO2 99 %  O2 Device Room Air  Pain Assessment  Pain Scale 0-10  Pain Score 10  Pain Type Acute  pain  Pain Location Buttocks  Pain Orientation Mid  Pain Radiating Towards back  Pain Descriptors / Indicators Burning;Sharp  Pain Frequency Intermittent  Pain Onset Progressive  Pain Intervention(s) Medication (See eMAR)  Multiple Pain Sites No  Neurological  Neuro (WDL) X  Level of Consciousness Alert  Orientation Level Oriented X4  Cognition Follows commands  Speech Clear  Pupil Assessment  No  R Hand Grip Moderate;Other (Comment) (shaking)  L Hand Grip Moderate ;Other (Comment) (shaking)  RUE Motor Response Purposeful movement  RUE Motor Strength 5  LUE Motor Response Purposeful movement  LUE Motor Strength 5  RLE Motor Response Purposeful movement  RLE Motor Strength 4  LLE Motor Response Purposeful movement  LLE Sensation Tingling  LLE Motor Strength 4  Neuro Symptoms Tremors  Neuro symptoms relieved by Rest  Musculoskeletal  Musculoskeletal (WDL) X  Assistive Device MaxiMove  Generalized Weakness Yes  Weight Bearing Restrictions No  Musculoskeletal Details  RUE Weakness  LUE Weakness  RLE Weakness  LLE Weakness  Integumentary  Integumentary (WDL) X  Skin Color Appropriate for ethnicity  Skin Condition Dry  Skin Integrity Cracking;Abrasion  Abrasion Location Leg  Abrasion Location Orientation Left  Abrasion Intervention Other (Comment) (assessed)  Cracking Location Heel  Cracking Location Orientation Bilateral  Cracking Intervention Other (Comment) (lotion)  Ecchymosis Location Abdomen  Ecchymosis Location Orientation Bilateral  Ecchymosis Intervention Other (Comment)  Skin Turgor Non-tenting

## 2020-08-19 LAB — GLUCOSE, CAPILLARY
Glucose-Capillary: 96 mg/dL (ref 70–99)
Glucose-Capillary: 137 mg/dL — ABNORMAL HIGH (ref 70–99)
Glucose-Capillary: 97 mg/dL (ref 70–99)
Glucose-Capillary: 110 mg/dL — ABNORMAL HIGH (ref 70–99)

## 2020-08-19 LAB — CBC WITH DIFFERENTIAL/PLATELET
Abs Immature Granulocytes: 0.01 10*3/uL (ref 0.00–0.07)
Basophils Absolute: 0.1 10*3/uL (ref 0.0–0.1)
Basophils Relative: 1 %
Eosinophils Absolute: 0.4 10*3/uL (ref 0.0–0.5)
Eosinophils Relative: 6 %
HCT: 32.6 % — ABNORMAL LOW (ref 39.0–52.0)
Hemoglobin: 9.9 g/dL — ABNORMAL LOW (ref 13.0–17.0)
Immature Granulocytes: 0 %
Lymphocytes Relative: 42 %
Lymphs Abs: 2.7 10*3/uL (ref 0.7–4.0)
MCH: 26.1 pg (ref 26.0–34.0)
MCHC: 30.4 g/dL (ref 30.0–36.0)
MCV: 85.8 fL (ref 80.0–100.0)
Monocytes Absolute: 0.5 10*3/uL (ref 0.1–1.0)
Monocytes Relative: 8 %
Neutro Abs: 2.8 10*3/uL (ref 1.7–7.7)
Neutrophils Relative %: 43 %
Platelets: 324 10*3/uL (ref 150–400)
RBC: 3.8 MIL/uL — ABNORMAL LOW (ref 4.22–5.81)
RDW: 16.8 % — ABNORMAL HIGH (ref 11.5–15.5)
WBC: 6.5 10*3/uL (ref 4.0–10.5)
nRBC: 0 % (ref 0.0–0.2)

## 2020-08-19 LAB — BASIC METABOLIC PANEL
Anion gap: 6 (ref 5–15)
BUN: 6 mg/dL (ref 6–20)
CO2: 30 mmol/L (ref 22–32)
Calcium: 8.7 mg/dL — ABNORMAL LOW (ref 8.9–10.3)
Chloride: 102 mmol/L (ref 98–111)
Creatinine, Ser: 0.87 mg/dL (ref 0.61–1.24)
GFR, Estimated: >60 mL/min (ref >60)
Glucose, Bld: 116 mg/dL — ABNORMAL HIGH (ref 70–99)
Potassium: 3.8 mmol/L (ref 3.5–5.1)
Sodium: 138 mmol/L (ref 135–145)

## 2020-08-19 NOTE — Progress Notes (Signed)
Physical Therapy Session Note  Patient Details  Name: Carl Hill MRN: [id number] Date of Birth: Oct 17, 1970  Today's Date: 08/19/2020 PT Individual Time: 1105-1200 PT Individual Time Calculation (min): 55 min   Short Term Goals: Week 2:  PT Short Term Goal 1 (Week 2): Pt will initiate gait training with RW. PT Short Term Goal 2 (Week 2): Pt will stand with RW x 2 minutes with min A x 2. PT Short Term Goal 3 (Week 2): Pt will tolerate >20 min of activity without incidence of pain.  Skilled Therapeutic Interventions/Progress Updates:     Pt received in bed with his mother present, who remained throughout session. Supine<>sitting EOB with supervision and use of bed features. Pt donned shoes and socks with total A. Pt c/o of BIL knee pain with activity and R knee pain that persists at rest. Pt demonstrated lateral joint line tenderness, positive McMurray's test, tenderness and palpable trigger points at distal hamstrings, "grinding" sensation with knee extension, and discomfort with superior/inferior patellar glides. Pain addressed with rest breaks and ice pack at end of session. Will continue to monitor R knee.  Pt performed STS with bari recliner with CGA x 2 for safety throughout session. Session focussed on improving knee control with squats using bari recliner. Pt educated on squat technique and was able to improve knee control and recognize when he became too fatigued to safely continue. Pt returned to bed after session, with side steps toward the head of bed with recliner and CGA x 2, and supervision sit>supine. Pt left with all needs in reach and mother present at end of session.    Therapy Documentation Precautions:  Precautions Precautions: Fall Precaution Comments: foley catheter, large buttocks wound, R heel wound Restrictions Weight Bearing Restrictions: No General:    Therapy/Group: Individual Therapy  Dyane Dustman, SPT 08/19/2020, 7:40 AM

## 2020-08-19 NOTE — Plan of Care (Signed)
  Problem: Consults Goal: Skin Care Protocol Initiated - if Braden Score 18 or less Description: WOC following patient Pt will understand plan of care for wounds 08/19/2020 0426 by Roseanne Reno, RN Outcome: Progressing 08/19/2020 0426 by Roseanne Reno, RN Outcome: Progressing Goal: Diabetes Guidelines if Diabetic/Glucose > 140 Description: If diabetic or lab glucose is > 140 mg/dl - Initiate Diabetes/Hyperglycemia Guidelines & Document Interventions  Pt will understand diet, medications, and need for f/u care with min cues 08/19/2020 0426 by Roseanne Reno, RN Outcome: Progressing 08/19/2020 0426 by Roseanne Reno, RN Outcome: Progressing   Problem: RH BOWEL ELIMINATION Goal: RH STG MANAGE BOWEL WITH ASSISTANCE Description: STG Manage Bowel with min Assistance. 08/19/2020 0426 by Roseanne Reno, RN Outcome: Progressing 08/19/2020 0426 by Roseanne Reno, RN Outcome: Progressing   Problem: RH BLADDER ELIMINATION Goal: RH STG MANAGE BLADDER WITH EQUIPMENT WITH ASSISTANCE Description: STG Manage Bladder With  foley catheter Equipment With min Assistance 08/19/2020 0426 by Roseanne Reno, RN Outcome: Progressing 08/19/2020 0426 by Roseanne Reno, RN Outcome: Progressing   Problem: RH SKIN INTEGRITY Goal: RH STG SKIN FREE OF INFECTION/BREAKDOWN Description: Pt will have improvement of current wounds no further breakdown or infection while on rehab with min assist 08/19/2020 0426 by Roseanne Reno, RN Outcome: Progressing 08/19/2020 0426 by Roseanne Reno, RN Outcome: Progressing Goal: RH STG MAINTAIN SKIN INTEGRITY WITH ASSISTANCE Description: STG Maintain Skin Integrity With min Assistance. 08/19/2020 0426 by Roseanne Reno, RN Outcome: Progressing 08/19/2020 0426 by Roseanne Reno, RN Outcome: Progressing Goal: RH STG ABLE TO PERFORM INCISION/WOUND CARE W/ASSISTANCE Description: STG Able To Perform Incision/Wound Care With max Assistance. 08/19/2020 0426 by  Roseanne Reno, RN Outcome: Progressing 08/19/2020 0426 by Roseanne Reno, RN Outcome: Progressing   Problem: RH SAFETY Goal: RH STG ADHERE TO SAFETY PRECAUTIONS W/ASSISTANCE/DEVICE Description: STG Adhere to Safety Precautions With min Assistance/Device. 08/19/2020 0426 by Roseanne Reno, RN Outcome: Progressing 08/19/2020 0426 by Roseanne Reno, RN Outcome: Progressing Goal: RH STG DECREASED RISK OF FALL WITH ASSISTANCE Description: STG Decreased Risk of Fall With min Assistance. 08/19/2020 0426 by Roseanne Reno, RN Outcome: Progressing 08/19/2020 0426 by Roseanne Reno, RN Outcome: Progressing   Problem: RH PAIN MANAGEMENT Goal: RH STG PAIN MANAGED AT OR BELOW PT'S PAIN GOAL Description: Pt will have pain that is <= 4/10 08/19/2020 0426 by Roseanne Reno, RN Outcome: Progressing 08/19/2020 0426 by Roseanne Reno, RN Outcome: Progressing   Problem: RH KNOWLEDGE DEFICIT GENERAL Goal: RH STG INCREASE KNOWLEDGE OF SELF CARE AFTER HOSPITALIZATION Description: Patient will demonstrate knowledge of medication management, dietary management, diabetes management, skin/wound care management with educational materials and handouts provided by staff. 08/19/2020 0426 by Roseanne Reno, RN Outcome: Progressing 08/19/2020 0426 by Roseanne Reno, RN Outcome: Progressing   Problem: Consults Goal: RH GENERAL PATIENT EDUCATION Description: See Patient Education module for education specifics. 08/19/2020 0426 by Roseanne Reno, RN Outcome: Progressing 08/19/2020 0426 by Roseanne Reno, RN Outcome: Progressing

## 2020-08-19 NOTE — Progress Notes (Signed)
Occupational Therapy Session Note  Patient Details  Name: Carl Hill MRN: [id number] Date of Birth: December 14, 1970  Today's Date: 08/19/2020 OT Individual Time: 1345-1425 OT Individual Time Calculation (min): 40 min    Short Term Goals: Week 2:  OT Short Term Goal 1 (Week 2): Patient will complete sit<>stand with max A +2 in prep for transfer OT Short Term Goal 2 (Week 2): Pt will complete LB bathing tasks with max A sitting EOB using AE PRN OT Short Term Goal 3 (Week 2): Pt will don socks with max A using AE PRN  Skilled Therapeutic Interventions/Progress Updates:    Pt resting in bed upon arrival. OT intervention with focus on bed mobility, sitting balance, sit<>stand, standing balance, and activity tolerance to increase independence with BADLs. Supine<>sit EOB with supervision. Sitting balance with supervision for shaving activity at EOB. Sit<>stand X4. While standing, pt performed punches 4x10 with unilateral UE support. Sit<>stand and standing balance with supervision and bari recliner stabilized. Pt returned to bed and remained in bed with all needs within reach. RN notified that pt's dressing needed changing.   Therapy Documentation Precautions:  Precautions Precautions: Fall Precaution Comments: foley catheter, large buttocks wound, R heel wound Restrictions Weight Bearing Restrictions: No  Pain: Pt denies pain this afternoon  Therapy/Group: Individual Therapy  [name] 08/19/2020, 2:35 PM

## 2020-08-19 NOTE — Progress Notes (Signed)
PROGRESS NOTE   Subjective/Complaints:  Pt reports had an assisted fall yesterday with PT- needed hoyer lift to get back in bed-  Said R knee gave way.   Also got a scratch on R shin due to this.   Said got bowel s "cleaned out" Friday and Saturday- didn't get Miralax until  Friday late afternoon.  Hemorrhoids are acting up due to so many stools- but noted the cream Dr Riley Kill gave him was making Sx's better.   Said trying to reduce PO prn pain meds- except oxycontin and PO dilaudid for dressing changes- he's trying to reduce oxycodone prn  Asking if possible to get some B/L knee sleeves. I will check with PT- who will check with [facility].     ROS:  Pt denies SOB, abd pain, CP, N/V/C/D, and vision changes       Objective:   No results found. Recent Labs    08/19/20 0726  WBC 6.5  HGB 9.9*  HCT 32.6*  PLT 324   Recent Labs    08/19/20 0726  NA 138  K 3.8  CL 102  CO2 30  GLUCOSE 116*  BUN 6  CREATININE 0.87  CALCIUM 8.7*    Intake/Output Summary (Last 24 hours) at 08/19/2020 1420 Last data filed at 08/19/2020 1331 Gross per 24 hour  Intake 600 ml  Output 3200 ml  Net -2600 ml     Pressure Injury 08/06/20 Heel Right Stage 2 -  Partial thickness loss of dermis presenting as a shallow open injury with a red, pink wound bed without slough. (Active)  08/06/20 0240  Location: Heel  Location Orientation: Right  Staging: Stage 2 -  Partial thickness loss of dermis presenting as a shallow open injury with a red, pink wound bed without slough.  Wound Description (Comments):   Present on Admission: Yes    Physical Exam: Vital Signs Blood pressure 127/68, pulse (!) 101, temperature 98.5 F (36.9 C), temperature source Oral, resp. rate 16, height 5\' 10"  (1.778 m), weight (!) 239.7 kg, SpO2 97 %.     General: awake, alert, appropriate,  Laying supine in bed- getting foley worked on by nursing, mother at  bedside,  BMI 75+ ; NAD HENT: conjugate gaze; oropharynx moist CV: regular rhythm- rate borderline tachycardia; no JVD Pulmonary: CTA B/L; no W/R/R- good air movement GI: soft, NT, ND, (+)BS- hypoactive Psychiatric: appropriate- interactive Neurological: Ox3 Skin: Warm and dry. Has post op wound 26x17 cm on backside- NOT pressure ulcer Has a  A little dried blood scratch on R anterior shin- looks well. -   Left 2nd toe- clean, really almost resolved. dressing removed Musc: No edema in extremities.  No tenderness in extremities. Musculoskeletal: Bilateral upper extremities: 5/5 throughout Bilateral lower extremities: Hip flexion grossly 4+/5, distally 5/5   Assessment/Plan: 1. Functional deficits which require 3+ hours per day of interdisciplinary therapy in a comprehensive inpatient rehab setting.  Physiatrist is providing close team supervision and 24 hour management of active medical problems listed below.  Physiatrist and rehab team continue to assess barriers to discharge/monitor patient progress toward functional and medical goals  Care Tool:  Bathing    Body parts  bathed by patient: Right arm,Left arm,Chest,Abdomen,Face,Right upper leg,Left upper leg   Body parts bathed by helper: Front perineal area,Buttocks,Right lower leg,Left lower leg     Bathing assist Assist Level: Maximal Assistance - Patient 24 - 49%     Upper Body Dressing/Undressing Upper body dressing   What is the patient wearing?: Pull over shirt    Upper body assist Assist Level: Supervision/Verbal cueing    Lower Body Dressing/Undressing Lower body dressing      What is the patient wearing?: Incontinence brief     Lower body assist Assist for lower body dressing: Maximal Assistance - Patient 25 - 49%     Toileting Toileting    Toileting assist Assist for toileting: Dependent - Patient 0%     Transfers Chair/bed transfer  Transfers assist  Chair/bed transfer activity did not occur:  Safety/medical concerns        Locomotion Ambulation   Ambulation assist   Ambulation activity did not occur: Safety/medical concerns  Assist level: 2 helpers Assistive device: Other (comment) (recliner) Max distance: 1710ft   Walk 10 feet activity   Assist  Walk 10 feet activity did not occur: Safety/medical concerns  Assist level: 2 helpers (+2 for safety with bari recliner) Assistive device: Other (comment) (recliner)   Walk 50 feet activity   Assist Walk 50 feet with 2 turns activity did not occur: Safety/medical concerns         Walk 150 feet activity   Assist Walk 150 feet activity did not occur: Safety/medical concerns         Walk 10 feet on uneven surface  activity   Assist Walk 10 feet on uneven surfaces activity did not occur: Safety/medical concerns         Wheelchair     Assist Will patient use wheelchair at discharge?: Yes Type of Wheelchair: Manual Wheelchair activity did not occur: Safety/medical concerns         Wheelchair 50 feet with 2 turns activity    Assist    Wheelchair 50 feet with 2 turns activity did not occur: Safety/medical concerns       Wheelchair 150 feet activity     Assist  Wheelchair 150 feet activity did not occur: Safety/medical concerns       Blood pressure 127/68, pulse (!) 101, temperature 98.5 F (36.9 C), temperature source Oral, resp. rate 16, height 5\' 10"  (1.778 m), weight (!) 239.7 kg, SpO2 97 %.  Medical Problem List and Plan: 1.  Debility secondary to septic shock from necrotizing soft tissue infection of perianal, gluteal, and perineal tissues.  3/16- will try ACE wrap to stabilize R knee buckling-  3/17- ACE wrap slipped down- con't PT and OT for working esp on walking/transfers and ADLs    3/19 may participate with therapies as tolerated  -3/21- will see if possible to get B/L knee sleeves from [facility]? Might be ordered? Con't PT and OT as tolerated  2.   Antithrombotics: -DVT/anticoagulation:  Pharmaceutical:   Xarelto  3/15- pt doesn't want Coumadin and wants to continue on Xarelto- after prolonged d/w pt.              -antiplatelet therapy: N/A 3. Pain Management:  Continue Lyrica and elavil for neuropathy.              Oxycontin 15 mg bid with 10 mg prn for breakthrough pain.   Added Robaxin 500 mg QID prn for muscle spasms.    3/14- no IV anymore- doing ok  with PO meds for dressing changes- con't regimen  3/15- getting the PO Dilaudid for wound care- otherwise, pain controlled  3/16- changed robaxin to 750 mg q6 hours prn  3/20 pain improved. Continue anusol for ano-rectal discomfort  3/21- trying ot wean Prn Oxycodone, con't Oxycontin and Dilaudid for wound care changes 4. Mood: LCSW to follow for evaluation and support.              -antipsychotic agents: N/A 5. Neuropsych: This patient is capable of making decisions on his own behalf. 6. Skin/Wound Care: Air mattress for pressure relief measures.              --will need local care frequently to keep sacral wound bed clean.             --Continue foley for now.  3/10- con't sacral wound care daily and prn  Con't low air loss mattress and WOC note in chart- isn't pressure- is post op wound  3/14- wound looks better- edges closing in by granulation and no slough con't wound care and foley due to high risk of MASD  3/16- change wound care to BID AND prn  3/17- worked better for pt- to have wound care scheduled and prn- con't regimen  3/19 added hemorrhoidal cream to assist with any pain which might be ano-rectal/hemorrhoidal  3/21- has helped Sx's- con't regimen 7. Fluids/Electrolytes/Nutrition: Monitor I/Os.  8. Necrotizing fascitis: Has been on Augmentin since 02/11--> to continue for 6 months per ID 9. T2DM with neuropathy: Hgb A1c- 9.6 (down from 13 in Aug) Will continue to monitor BS ac/hs and use SSI for elevated BS             --used Farxiga, metformin and Victoza 1.8.              --Continue Levemir bid and titrate as indicated  3/14- 99-129- well controlled- con't regimen  3/15- BGs great- almost too good- 78-104 in last 24 hours- if goes down any more, might need to back off- also, will have nurse decrease AM dose of Novolog to 9 units, not 18 units due to BG of 83 and no carbs for breakfast.   3/16- will decrease Novolog to 10 units TID with meals- since BGs 82-124 in last 24 hours  3/17- had episode where they had to give him juice x2 to bring BG up- will reduce Levemir to 40 units BID and con't reduced Novolg- A1c is 5.9- MUCH improved- con't to monitor/manage  3/21- BGs 96-158- good/very good control- con't regimen 10 Hyponatremia: Likely due to multiple BM yesterday.              Sodium 138 on 3/10, continue to monitor 11. Hypokalemia:    Potassium 3.9 on 3/10 after supplementation  3/21- has maintained K+ level at 3.8- con't to monitor 12. Proximal A FIb: Monitor HR tid--continue Cardizem 240 mg and amiodarone for rate control.              --monitor for symptoms with increase in activity.  3/16- HR 99 this AM- will monitor for RVR- esp with activity   3/17- HR back down to low 90s this AM- con't to monitor 13. OIC:              Discontinued colace to help bulk stools as now having loose stools.  3/20 moving bowels regularly now  3/21- was full of stool- got cleaned out-  14. Chronic right heel ulcer: Continue to paint wound daily  with iodine.   3/16- is Stage II- healing 15.  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 Will need bariatric equipment.  3/14- BMI down to 74.68  3/16- BMI down to 74. 12  3/17- BMI up to 75.82 this AM- still lower than admission, but not sure why would have changed 'so fast"- scale off? 16. Trach site  3/9- healing- but leave open to air.  3/11- almost completely healed- scab coming off- and healthy tissue   underneath.  3/15- resolved/healed 17. Foley for urinary retention  3/14-  Will keep Foley- got ok by  head nurse, because he has such vast post op wounds from necrotizing fasciitis- con't for now  3/19- will con't foley since has such open wounds- to prevent additonal skin breakdown  3/21- will con't foley- due to open wounds and to prevent additional skin breakdown  19. Disposition:  at baseline, patient received significant assistance from his mother, aged 18. He required her assistance to wipe stool after BM due to body habitus. He was able to ambulate to bathroom and back on his own. Plan is for discharge home to father, who is a IT sales professional, and who can provide more physical support to patient. Goal is ModA level for ambulation within his room. Plan for approx 2-3 week stay to reach this goal. He is currently demonstrating improved standing tolerance. Fuller Song discussed with father.       LOS: 13 days A FACE TO FACE EVALUATION WAS PERFORMED  Megan Lovorn 08/19/2020, 2:20 PM

## 2020-08-19 NOTE — Progress Notes (Signed)
Occupational Therapy Session Note  Patient Details  Name: Carl Hill MRN: [id number] Date of Birth: 05/22/1971  Today's Date: 08/19/2020 OT Individual Time: 0931-1000 OT Individual Time Calculation (min): 29 min    Short Term Goals: Week 1:  OT Short Term Goal 1 (Week 1): Patient will tolerate sitting EOB for 15 minutes in preparation for BADL task OT Short Term Goal 1 - Progress (Week 1): Met OT Short Term Goal 2 (Week 1): Patient will complete sit<>stand with max A +2 in prep for transfer OT Short Term Goal 2 - Progress (Week 1): Progressing toward goal OT Short Term Goal 3 (Week 1): Patient will complete 50% of U Bbathing task while seated EOB. OT Short Term Goal 3 - Progress (Week 1): Met Week 2:  OT Short Term Goal 1 (Week 2): Patient will complete sit<>stand with max A +2 in prep for transfer OT Short Term Goal 2 (Week 2): Pt will complete LB bathing tasks with max A sitting EOB using AE PRN OT Short Term Goal 3 (Week 2): Pt will don socks with max A using AE PRN   Skilled Therapeutic Interventions/Progress Updates:    Pt greeted at time of session supine in bed resting with mother present, remained throughout session. Already did ADL this am, wanting to do BUE therex. Pt able to direct care for operating bed for lowering rails and assisting with bed features. Supine > sit Supervision and sit <> stand CGA at bariatric recliner in room with knees blocked to place pillow under buttocks. Performed BUE there ex w/ 9# dowel for bicep curl, chest press, overhead press, FWD circles for 10-20 reps pending exercise. Rest breaks between sets. Returned to supine same manner. Call bell in reach all needs met.    Therapy Documentation Precautions:  Precautions Precautions: Fall Precaution Comments: foley catheter, large buttocks wound, R heel wound Restrictions Weight Bearing Restrictions: No     Therapy/Group: Individual Therapy  Hannah C Spach 08/19/2020, 7:12 AM 

## 2020-08-19 NOTE — Progress Notes (Signed)
Orthopedic Tech Progress Note Patient Details:  Carl Hill 1971-04-21 [id number] Called in order to [facility] for a BLE KNEE SLEEVES  Patient ID: Carl Hill, male   DOB: Jun 14, 1970, 50 y.o.   MRN: [id number]   [name] 08/19/2020, 3:34 PM

## 2020-08-19 NOTE — Progress Notes (Signed)
Occupational Therapy Session Note  Patient Details  Name: Carl Hill MRN: [id number] Date of Birth: 1971/02/06  Today's Date: 08/19/2020 OT Individual Time: 0800-0900 OT Individual Time Calculation (min): 60 min    Short Term Goals: Week 2:  OT Short Term Goal 1 (Week 2): Patient will complete sit<>stand with max A +2 in prep for transfer OT Short Term Goal 2 (Week 2): Pt will complete LB bathing tasks with max A sitting EOB using AE PRN OT Short Term Goal 3 (Week 2): Pt will don socks with max A using AE PRN  Skilled Therapeutic Interventions/Progress Updates:    OT intervention with focus on bed mobility, sitting balance, sit<>stand, UB bathing/dressing EOB, standing balance, and safety awareness to increase independence with BADLs. Pt completes UB bathing/dressing with supervision. Sitting balance with supervision. Sit<>stand and standing balance using bari recliner with supervision and recliner stabilized. Discussed pt's assisted fall over the weekend. Pt still encouraged about progress. Sit<>stand X 5 with BUE support. All bed mobility with superviison using hospital bed functions.   Therapy Documentation Precautions:  Precautions Precautions: Fall Precaution Comments: foley catheter, large buttocks wound, R heel wound Restrictions Weight Bearing Restrictions: No  Pain: Pain Assessment Pain Scale: 0-10 Pain Score: 8  Pain Type: Acute pain Pain Location: Leg Pain Orientation: Right Pain Radiating Towards: knee Pain Descriptors / Indicators: Sharp Pain Frequency: Intermittent Pain Onset: Gradual Patients Stated Pain Goal: 3 Pain Intervention(s): premedicated    Therapy/Group: Individual Therapy  [name] 08/19/2020, 12:07 PM

## 2020-08-19 NOTE — Plan of Care (Signed)
  Problem: Consults Goal: Skin Care Protocol Initiated - if Braden Score 18 or less Description: WOC following patient Pt will understand plan of care for wounds Outcome: Progressing Goal: Diabetes Guidelines if Diabetic/Glucose > 140 Description: If diabetic or lab glucose is > 140 mg/dl - Initiate Diabetes/Hyperglycemia Guidelines & Document Interventions  Pt will understand diet, medications, and need for f/u care with min cues Outcome: Progressing   Problem: RH BOWEL ELIMINATION Goal: RH STG MANAGE BOWEL WITH ASSISTANCE Description: STG Manage Bowel with min Assistance. Outcome: Progressing   Problem: RH BLADDER ELIMINATION Goal: RH STG MANAGE BLADDER WITH EQUIPMENT WITH ASSISTANCE Description: STG Manage Bladder With  foley catheter Equipment With min Assistance Outcome: Progressing   Problem: RH SKIN INTEGRITY Goal: RH STG SKIN FREE OF INFECTION/BREAKDOWN Description: Pt will have improvement of current wounds no further breakdown or infection while on rehab with min assist Outcome: Progressing Goal: RH STG MAINTAIN SKIN INTEGRITY WITH ASSISTANCE Description: STG Maintain Skin Integrity With min Assistance. Outcome: Progressing Goal: RH STG ABLE TO PERFORM INCISION/WOUND CARE W/ASSISTANCE Description: STG Able To Perform Incision/Wound Care With max Assistance. Outcome: Progressing   Problem: RH SAFETY Goal: RH STG ADHERE TO SAFETY PRECAUTIONS W/ASSISTANCE/DEVICE Description: STG Adhere to Safety Precautions With min Assistance/Device. Outcome: Progressing Goal: RH STG DECREASED RISK OF FALL WITH ASSISTANCE Description: STG Decreased Risk of Fall With min Assistance. Outcome: Progressing   Problem: RH PAIN MANAGEMENT Goal: RH STG PAIN MANAGED AT OR BELOW PT'S PAIN GOAL Description: Pt will have pain that is <= 4/10 Outcome: Progressing   Problem: RH KNOWLEDGE DEFICIT GENERAL Goal: RH STG INCREASE KNOWLEDGE OF SELF CARE AFTER HOSPITALIZATION Description: Patient  will demonstrate knowledge of medication management, dietary management, diabetes management, skin/wound care management with educational materials and handouts provided by staff. Outcome: Progressing   Problem: Consults Goal: RH GENERAL PATIENT EDUCATION Description: See Patient Education module for education specifics. Outcome: Progressing   

## 2020-08-19 NOTE — Progress Notes (Signed)
   08/18/20 1339  What Happened  Was fall witnessed? Yes  Who witnessed fall? PT therapy  Patients activity before fall ambulating-assisted  Was patient injured? No  Follow Up  MD notified yes  Time MD notified 1344  Family notified Yes - comment  Time family notified 1346  Progress note created (see row info) Yes  Adult Fall Risk Assessment  Risk Factor Category (scoring not indicated) Fall has occurred during this admission (document High fall risk)  Age 50  Fall History: Fall within 6 months prior to admission 5  Elimination; Bowel and/or Urine Incontinence 2  Elimination; Bowel and/or Urine Urgency/Frequency 0  Medications: includes PCA/Opiates, Anti-convulsants, Anti-hypertensives, Diuretics, Hypnotics, Laxatives, Sedatives, and Psychotropics 5  Patient Care Equipment 1  Mobility-Assistance 2  Mobility-Gait 2  Mobility-Sensory Deficit 0  Altered awareness of immediate physical environment 0  Impulsiveness 0  Lack of understanding of one's physical/cognitive limitations 0  Total Score 17  Patient Fall Risk Level High fall risk  Adult Fall Risk Interventions  Required Bundle Interventions *See Row Information* High fall risk - low, moderate, and high requirements implemented  Additional Interventions Lap belt while in chair/wheelchair (Rehab only);PT/OT need assessed if change in mobility from baseline;Use of appropriate toileting equipment (bedpan, BSC, etc.)  Screening for Fall Injury Risk (To be completed on HIGH fall risk patients) - Assessing Need for Floor Mats  Risk For Fall Injury- Criteria for Floor Mats Previous fall this admission  Will Implement Floor Mats Yes  Vitals  Temp 99.7 F (37.6 C)  Temp Source Oral  BP 100/83  MAP (mmHg) 90  BP Location Left Arm  BP Method Automatic  Patient Position (if appropriate) Sitting  Pulse Rate 98  Resp 19  Oxygen Therapy  SpO2 99 %  O2 Device Room Air  Pain Assessment  Pain Scale 0-10  Pain Score 10  Pain Type Acute  pain  Pain Location Buttocks  Pain Orientation Mid  Pain Radiating Towards back  Pain Descriptors / Indicators Burning;Sharp  Pain Frequency Intermittent  Pain Onset Progressive  Pain Intervention(s) Medication (See eMAR)  Multiple Pain Sites No  Neurological  Neuro (WDL) X  Level of Consciousness Alert  Orientation Level Oriented X4  Cognition Follows commands  Speech Clear  Pupil Assessment  No  R Hand Grip Moderate;Other (Comment) (shaking)  L Hand Grip Moderate ;Other (Comment) (shaking)  RUE Motor Response Purposeful movement  RUE Motor Strength 5  LUE Motor Response Purposeful movement  LUE Motor Strength 5  RLE Motor Response Purposeful movement  RLE Motor Strength 4  LLE Motor Response Purposeful movement  LLE Sensation Tingling  LLE Motor Strength 4  Neuro Symptoms Tremors  Neuro symptoms relieved by Rest  Musculoskeletal  Musculoskeletal (WDL) X  Assistive Device MaxiMove  Generalized Weakness Yes  Weight Bearing Restrictions No  Musculoskeletal Details  RUE Weakness  LUE Weakness  RLE Weakness  LLE Weakness  Integumentary  Integumentary (WDL) X  Skin Color Appropriate for ethnicity  Skin Condition Dry  Skin Integrity Cracking;Abrasion  Abrasion Location Leg  Abrasion Location Orientation Left  Abrasion Intervention Other (Comment) (assessed)  Cracking Location Heel  Cracking Location Orientation Bilateral  Cracking Intervention Other (Comment) (lotion)  Ecchymosis Location Abdomen  Ecchymosis Location Orientation Bilateral  Ecchymosis Intervention Other (Comment)  Skin Turgor Non-tenting  Duard Spiewak L Zanasia Hickson, LPN

## 2020-08-19 NOTE — Plan of Care (Signed)
  Problem: Consults Goal: Skin Care Protocol Initiated - if Braden Score 18 or less Description: WOC following patient Pt will understand plan of care for wounds Outcome: Progressing Goal: Diabetes Guidelines if Diabetic/Glucose > 140 Description: If diabetic or lab glucose is > 140 mg/dl - Initiate Diabetes/Hyperglycemia Guidelines & Document Interventions  Pt will understand diet, medications, and need for f/u care with min cues Outcome: Progressing   Problem: RH BOWEL ELIMINATION Goal: RH STG MANAGE BOWEL WITH ASSISTANCE Description: STG Manage Bowel with min Assistance. Outcome: Progressing   Problem: RH SKIN INTEGRITY Goal: RH STG SKIN FREE OF INFECTION/BREAKDOWN Description: Pt will have improvement of current wounds no further breakdown or infection while on rehab with min assist Outcome: Progressing Goal: RH STG MAINTAIN SKIN INTEGRITY WITH ASSISTANCE Description: STG Maintain Skin Integrity With min Assistance. Outcome: Progressing   Problem: RH SAFETY Goal: RH STG ADHERE TO SAFETY PRECAUTIONS W/ASSISTANCE/DEVICE Description: STG Adhere to Safety Precautions With min Assistance/Device. Outcome: Progressing Goal: RH STG DECREASED RISK OF FALL WITH ASSISTANCE Description: STG Decreased Risk of Fall With min Assistance. Outcome: Progressing   Problem: RH PAIN MANAGEMENT Goal: RH STG PAIN MANAGED AT OR BELOW PT'S PAIN GOAL Description: Pt will have pain that is <= 4/10 Outcome: Progressing   Problem: RH KNOWLEDGE DEFICIT GENERAL Goal: RH STG INCREASE KNOWLEDGE OF SELF CARE AFTER HOSPITALIZATION Description: Patient will demonstrate knowledge of medication management, dietary management, diabetes management, skin/wound care management with educational materials and handouts provided by staff. Outcome: Progressing

## 2020-08-20 LAB — GLUCOSE, CAPILLARY
Glucose-Capillary: 109 mg/dL — ABNORMAL HIGH (ref 70–99)
Glucose-Capillary: 120 mg/dL — ABNORMAL HIGH (ref 70–99)
Glucose-Capillary: 112 mg/dL — ABNORMAL HIGH (ref 70–99)
Glucose-Capillary: 154 mg/dL — ABNORMAL HIGH (ref 70–99)

## 2020-08-20 NOTE — Patient Care Conference (Signed)
Inpatient RehabilitationTeam Conference and Plan of Care Update Date: 08/20/2020   Time: 11:12 AM    Patient Name: Carl Hill      Medical Record Number: [id number]  Date of Birth: [birth year] Sex: Male         Room/Bed: 4W14C/4W14C-01 Payor Info: Payor: MEDICAID [place] / Plan: MEDICAID [place] ACCESS / Product Type: *No Product type* /    Admit Date/Time:  08/06/2020  1:01 AM  Primary Diagnosis:  Debility  Hospital Problems: Principal Problem:   Debility Active Problems:   Super-super obese (HCC)   Urinary retention   Hypokalemia   Labile blood glucose   Diabetic peripheral neuropathy (HCC)   Sacral pain   Hypoglycemia   Muscle spasms of both lower extremities   Recurrent knee instability, right    Expected Discharge Date: Expected Discharge Date: 09/03/20  Team Members Present: Physician leading conference: Dr. Genice Rouge Care Coodinator Present: Cecile Sheerer, LCSWA;Stacey Marlyne Beards, RN, BSN, CRRN Nurse Present: Kennyth Arnold, RN PT Present: Peter Congo, PT OT Present: Ardis Rowan, COTA;Jennifer Katrinka Blazing, OT PPS Coordinator present : Fae Pippin, SLP     Current Status/Progress Goal Weekly Team Focus  Bowel/Bladder   Incontinent of stool. LBM 08/19/2020 Foley catheter for wound healing  regular BMs  Assess B/B every shift and PRN   Swallow/Nutrition/ Hydration             ADL's   UB bathing/dressing-supervision seated EOB; max A LB bathing, dependent for LB dressing; toiet hygiene-dependent; sit<>stand with BUE support from EOB with CGA  min A/CGA overall  activity tolerance, sit<>stand, standing balance, BADL training, safety awareness, education   Mobility   Bed mobilty supervision, STS with bari recliner blocking knees +2 supervision for safety, STS with RW, short distance gait with recliner and w/c follow (+3 for safety)  min A overall  Standing tolerance, LE strength, progressing gait   Communication             Safety/Cognition/ Behavioral  Observations            Pain   Reports improved pain scale with less need for PRN oxycodone  Pain rating of <3/10  Assess pain every shift, PRN, and prior to wound care   Skin   Large wound to buttocks  Improvement of wound  Assess skin every shift and PRN     Discharge Planning:  Pt to d/c to mother's home. Mother is expected to have surery to remove a polyp during his current hospital stay. Mother will only be able to provide supervision level of care once surgery has been completed. Pt father does not live in the home and can assist PRN.   Team Discussion: Fall on Sunday with therapy, going home with dad, fitted for bilateral knee sleeves. CBG's doing too well, decreased Levemir. Order to apply Vaseline to right heel, has become too dried out and is cracking. Maintain foley, incontinent bowel, BID and PRN dressing changes to large wounds on buttocks. Pain reportedly been improving. Does need to be medicated for wound care.  Patient on target to meet rehab goals: Yes, doing well even after fall. Has walked some with bariatric recliner follow. Mom is hesitant to change dressings. Barriers are dressing changes, bed mobility, transfers to [facility], and they need a ramp.  *See Care Plan and progress notes for long and short-term goals.   Revisions to Treatment Plan:  Not at this time.  Teaching Needs: Family education, medication management, pain management, skin/wound care education, foley care  education, bowel management, transfer training, gait training, balance training, endurance training, safety awareness.  Current Barriers to Discharge: Inaccessible home environment, Decreased caregiver support, Medical stability, Home enviroment access/layout, Incontinence, Wound care, Lack of/limited family support, Weight, Weight bearing restrictions, Medication compliance and Behavior  Possible Resolutions to Barriers: Continue current medications, education on bowel/bladder/foley care, provide  emotional support.     Medical Summary Current Status: needs foley due to extensive post op wounds on backside; got measured for knee sleeves- assisted fall Sunday 3/20; BMI stable at 75; incontinent; s/p necrotizing fasciitis- wound 26x 17 cm- pain improving- R heel was stage II; now scab with iodine/ vasline added daily.  Barriers to Discharge: Decreased family/caregiver support;Weight;Weight bearing restrictions;Incontinence;Home enviroment access/layout;Other (comments);Medical stability;Wound care  Barriers to Discharge Comments: DM- better controlled A1c was 13- down to 5.9 on insulin; working on wound care; d/c to mother's; Possible Resolutions to [employer]: knee sleeves for knee stability; vaseline for R heel to help dryness; focus on bedpan for BMs- dispo issues and DM mgmtwill need bariatric hospital bed at home and will need ramp if goes to Mother's. d/c 4/5   Continued Need for Acute Rehabilitation Level of Care: The patient requires daily medical management by a physician with specialized training in physical medicine and rehabilitation for the following reasons: Direction of a multidisciplinary physical rehabilitation program to maximize functional independence : Yes Medical management of patient stability for increased activity during participation in an intensive rehabilitation regime.: Yes Analysis of laboratory values and/or radiology reports with any subsequent need for medication adjustment and/or medical intervention. : Yes   I attest that I was present, lead the team conference, and concur with the assessment and plan of the team.   Tennis Must 08/20/2020, 5:38 PM

## 2020-08-20 NOTE — Progress Notes (Signed)
Patient ID: Carl Hill, male   DOB: [birth year], 50 y.o.   MRN: [id number]  SW met with pt and pt mother in room to provide updates from team conference, and d/c date remains 4/5. His mother's surgery is not scheduled until 4/29. His mother will be primary caregiver, but pt states he may be able to go to his father's home during the time she is in the hospital. SW discussed family education, and waiting on updates on when this can be scheudled. SW waiting on HHA preference.   Loralee Pacas, MSW, [place] Office: [phone] Cell: [phone] Fax: [phone]

## 2020-08-20 NOTE — Progress Notes (Signed)
PROGRESS NOTE   Subjective/Complaints:  Pt reports [employer] came and measured for B/L knee sleeves- will get today or tomorrow.  Got measured.  LBM this AM- large size.  Foley back to working- had gotten detached in his fall.  They fixed hoyer lift in room.  When straightens R knee- grinds Plans on going home with mother, until mother goes for surgery in 1-2 months.    ROS:   Pt denies SOB, abd pain, CP, N/V/C/D, and vision changes    Objective:   No results found. Recent Labs    08/19/20 0726  WBC 6.5  HGB 9.9*  HCT 32.6*  PLT 324   Recent Labs    08/19/20 0726  NA 138  K 3.8  CL 102  CO2 30  GLUCOSE 116*  BUN 6  CREATININE 0.87  CALCIUM 8.7*    Intake/Output Summary (Last 24 hours) at 08/20/2020 1047 Last data filed at 08/20/2020 0700 Gross per 24 hour  Intake 720 ml  Output 4800 ml  Net -4080 ml     Pressure Injury 08/06/20 Heel Right Stage 2 -  Partial thickness loss of dermis presenting as a shallow open injury with a red, pink wound bed without slough. (Active)  08/06/20 0240  Location: Heel  Location Orientation: Right  Staging: Stage 2 -  Partial thickness loss of dermis presenting as a shallow open injury with a red, pink wound bed without slough.  Wound Description (Comments):   Present on Admission: Yes    Physical Exam: Vital Signs Blood pressure 132/67, pulse 70, temperature 97.6 F (36.4 C), temperature source Oral, resp. rate 17, height 5\' 10"  (1.778 m), weight (!) 239.7 kg, SpO2 99 %.      General: awake, alert, appropriate, in bariatric low air loss mattress, supine NAD HENT: conjugate gaze; oropharynx moist CV: regular rate and rhythm; no JVD Pulmonary: CTA B/L; no W/R/R- good air movement GI: soft, NT, ND, (+)BS- hypoactive Psychiatric: appropriate- interactive Neurological: Ox3  Skin: Warm and dry. Has post op wound 26x17 cm on backside- NOT pressure ulcer Has a  A  little dried blood scratch on R anterior shin- looks well. -   Left 2nd toe- clean, really almost resolved. dressing removed R heel- dry and cracked skin- over dry- a spot that's a large thick scab Musc: No edema in extremities.  No tenderness in extremities. Musculoskeletal: Bilateral upper extremities: 5/5 throughout Bilateral lower extremities: Hip flexion grossly 4+/5, distally 5/5   Assessment/Plan: 1. Functional deficits which require 3+ hours per day of interdisciplinary therapy in a comprehensive inpatient rehab setting.  Physiatrist is providing close team supervision and 24 hour management of active medical problems listed below.  Physiatrist and rehab team continue to assess barriers to discharge/monitor patient progress toward functional and medical goals  Care Tool:  Bathing    Body parts bathed by patient: Right arm,Left arm,Chest,Abdomen,Face,Right upper leg,Left upper leg   Body parts bathed by helper: Front perineal area,Buttocks,Right lower leg,Left lower leg     Bathing assist Assist Level: Maximal Assistance - Patient 24 - 49%     Upper Body Dressing/Undressing Upper body dressing   What is the patient wearing?: Pull  over shirt    Upper body assist Assist Level: Supervision/Verbal cueing    Lower Body Dressing/Undressing Lower body dressing      What is the patient wearing?: Incontinence brief     Lower body assist Assist for lower body dressing: Maximal Assistance - Patient 25 - 49%     Toileting Toileting    Toileting assist Assist for toileting: Dependent - Patient 0%     Transfers Chair/bed transfer  Transfers assist  Chair/bed transfer activity did not occur: Safety/medical concerns        Locomotion Ambulation   Ambulation assist   Ambulation activity did not occur: Safety/medical concerns  Assist level: 2 helpers Assistive device: Other (comment) (recliner) Max distance: 5ft   Walk 10 feet activity   Assist  Walk 10  feet activity did not occur: Safety/medical concerns  Assist level: 2 helpers (+2 for safety with bari recliner) Assistive device: Other (comment) (recliner)   Walk 50 feet activity   Assist Walk 50 feet with 2 turns activity did not occur: Safety/medical concerns         Walk 150 feet activity   Assist Walk 150 feet activity did not occur: Safety/medical concerns         Walk 10 feet on uneven surface  activity   Assist Walk 10 feet on uneven surfaces activity did not occur: Safety/medical concerns         Wheelchair     Assist Will patient use wheelchair at discharge?: Yes Type of Wheelchair: Manual Wheelchair activity did not occur: Safety/medical concerns         Wheelchair 50 feet with 2 turns activity    Assist    Wheelchair 50 feet with 2 turns activity did not occur: Safety/medical concerns       Wheelchair 150 feet activity     Assist  Wheelchair 150 feet activity did not occur: Safety/medical concerns       Blood pressure 132/67, pulse 70, temperature 97.6 F (36.4 C), temperature source Oral, resp. rate 17, height 5\' 10"  (1.778 m), weight (!) 239.7 kg, SpO2 99 %.  Medical Problem List and Plan: 1.  Debility secondary to septic shock from necrotizing soft tissue infection of perianal, gluteal, and perineal tissues.  3/16- will try ACE wrap to stabilize R knee buckling-  3/17- ACE wrap slipped down- con't PT and OT for working esp on walking/transfers and ADLs    3/19 may participate with therapies as tolerated  -3/22- pt to get measured for knee sleeves and get them today/tomorrow- con't PT and OT 2.  Antithrombotics: -DVT/anticoagulation:  Pharmaceutical:   Xarelto  3/15- pt doesn't want Coumadin and wants to continue on Xarelto- after prolonged d/w pt.              -antiplatelet therapy: N/A 3. Pain Management:  Continue Lyrica and elavil for neuropathy.              Oxycontin 15 mg bid with 10 mg prn for breakthrough  pain.   Added Robaxin 500 mg QID prn for muscle spasms.    3/14- no IV anymore- doing ok with PO meds for dressing changes- con't regimen  3/15- getting the PO Dilaudid for wound care- otherwise, pain controlled  3/16- changed robaxin to 750 mg q6 hours prn  3/20 pain improved. Continue anusol for ano-rectal discomfort  3/21- trying ot wean Prn Oxycodone, con't Oxycontin and Dilaudid for wound care changes 4. Mood: LCSW to follow for evaluation and support.              -  antipsychotic agents: N/A 5. Neuropsych: This patient is capable of making decisions on his own behalf. 6. Skin/Wound Care: Air mattress for pressure relief measures.              --will need local care frequently to keep sacral wound bed clean.             --Continue foley for now.  3/10- con't sacral wound care daily and prn  Con't low air loss mattress and WOC note in chart- isn't pressure- is post op wound  3/14- wound looks better- edges closing in by granulation and no slough con't wound care and foley due to high risk of MASD  3/16- change wound care to BID AND prn  3/17- worked better for pt- to have wound care scheduled and prn- con't regimen  3/19 added hemorrhoidal cream to assist with any pain which might be ano-rectal/hemorrhoidal  3/21- has helped Sx's- con't regimen  3/22- still needs low air loss mattress due to post op wounds on backside-  7. Fluids/Electrolytes/Nutrition: Monitor I/Os.  8. Necrotizing fascitis: Has been on Augmentin since 02/11--> to continue for 6 months per ID 9. T2DM with neuropathy: Hgb A1c- 9.6 (down from 13 in Aug) Will continue to monitor BS ac/hs and use SSI for elevated BS             --used Farxiga, metformin and Victoza 1.8.             --Continue Levemir bid and titrate as indicated  3/14- 99-129- well controlled- con't regimen  3/15- BGs great- almost too good- 78-104 in last 24 hours- if goes down any more, might need to back off- also, will have nurse decrease AM dose of  Novolog to 9 units, not 18 units due to BG of 83 and no carbs for breakfast.   3/16- will decrease Novolog to 10 units TID with meals- since BGs 82-124 in last 24 hours  3/17- had episode where they had to give him juice x2 to bring BG up- will reduce Levemir to 40 units BID and con't reduced Novolg- A1c is 5.9- MUCH improved- con't to monitor/manage  3/22- BGs 97-130s- doing great- will send home on current regimen- need to teach pt how to do insulin injections 10 Hyponatremia: Likely due to multiple BM yesterday.              Sodium 138 on 3/10, continue to monitor 11. Hypokalemia:    Potassium 3.9 on 3/10 after supplementation  3/21- has maintained K+ level at 3.8- con't to monitor 12. Proximal A FIb: Monitor HR tid--continue Cardizem 240 mg and amiodarone for rate control.              --monitor for symptoms with increase in activity.  3/16- HR 99 this AM- will monitor for RVR- esp with activity   3/17- HR back down to low 90s this AM- con't to monitor 13. OIC:              Discontinued colace to help bulk stools as now having loose stools.  3/20 moving bowels regularly now  3/21- was full of stool- got cleaned out-   3/22- having daily BMs- con't regimen 14. Chronic right heel ulcer: Continue to paint wound daily with iodine.   3/16- is Stage II- healing 15.  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 Will need bariatric equipment.  3/14- BMI down to 74.68  3/16- BMI down to 74. 12  3/17- BMI up to 75.82  this AM- still lower than admission, but not sure why would have changed 'so fast"- scale off? 16. Trach site  3/9- healing- but leave open to air.  3/11- almost completely healed- scab coming off- and healthy tissue   underneath.  3/15- resolved/healed 17. Foley for urinary retention  3/14-  Will keep Foley- got ok by head nurse, because he has such vast post op wounds from necrotizing fasciitis- con't for now  3/19- will con't foley since has such open wounds- to  prevent additonal skin breakdown  3/21- will con't foley- due to open wounds and to prevent additional skin breakdown  19. Disposition:  at baseline, patient received significant assistance from his mother, aged 9. He required her assistance to wipe stool after BM due to body habitus. He was able to ambulate to bathroom and back on his own. Plan is for discharge home to father, who is a IT sales professional, and who can provide more physical support to patient. Goal is ModA level for ambulation within his room. Plan for approx 2-3 week stay to reach this goal. He is currently demonstrating improved standing tolerance. Fuller Song discussed with father.   3/22- pt insists going home with mother- says she can do wound care better- will go to fathers when mother has surgery.       LOS: 14 days A FACE TO FACE EVALUATION WAS PERFORMED  Carl Hill 08/20/2020, 10:47 AM

## 2020-08-20 NOTE — Progress Notes (Signed)
Occupational Therapy Session Note  Patient Details  Name: Carl Hill MRN: [id number] Date of Birth: Dec 01, 1970  Today's Date: 08/20/2020 OT Individual Time: 0900-1000 OT Individual Time Calculation (min): 60 min    Short Term Goals: Week 2:  OT Short Term Goal 1 (Week 2): Patient will complete sit<>stand with max A +2 in prep for transfer OT Short Term Goal 2 (Week 2): Pt will complete LB bathing tasks with max A sitting EOB using AE PRN OT Short Term Goal 3 (Week 2): Pt will don socks with max A using AE PRN  Skilled Therapeutic Interventions/Progress Updates:    Pt resting in bed upon arrival. OT intervention with focus on bed mobility, sitting balance, sit<>stand, standing balance, UB bathing/dressing, and BUE activities with focus on BUE strengthening and increase flexibility. Pt used 6# barbells and green theraband for overhead presses, shoulder horizontal abduction/adduction, and diagonals 3x10 each. Pt required rest breaks between each set. Pt returned to supine in bed. Supervision for all bed mobility. Pt remained in bed with all needs within reach and mother present.  Therapy Documentation Precautions:  Precautions Precautions: Fall Precaution Comments: foley catheter, large buttocks wound, R heel wound Restrictions Weight Bearing Restrictions: No   Pain: Pain Assessment Pain Scale: 0-10 Pain Score: 6  Pain Type: Acute pain Pain Location: Buttocks Pain Orientation: Right;Left Pain Radiating Towards: back Pain Descriptors / Indicators: Aching Patients Stated Pain Goal: 3 Pain Intervention(s): Medication (See eMAR) 2nd Pain Site Pain Score: 8 Pain Type: Acute pain Pain Location: Knee Pain Orientation: Right Pain Descriptors / Indicators: Aching Pain Frequency: Constant Pain Onset: On-going Patient's Stated Pain Goal: 3 Pain Intervention(s): RN aware and repositioned   Therapy/Group: Individual Therapy  [name] 08/20/2020, 10:05 AM

## 2020-08-20 NOTE — Progress Notes (Signed)
Occupational Therapy Session Note  Patient Details  Name: Carl Hill MRN: [id number] Date of Birth: 25-Oct-1970  Today's Date: 08/20/2020 OT Individual Time: 9450-3888 OT Individual Time Calculation (min): 58 min    Short Term Goals: Week 2:  OT Short Term Goal 1 (Week 2): Patient will complete sit<>stand with max A +2 in prep for transfer OT Short Term Goal 2 (Week 2): Pt will complete LB bathing tasks with max A sitting EOB using AE PRN OT Short Term Goal 3 (Week 2): Pt will don socks with max A using AE PRN  Skilled Therapeutic Interventions/Progress Updates:    Pt in bed to start session agreeable to working on transfer to the EOB and standing intervals.  Therapist applied ace bandages to the right knee for increased support prior to transfer to the EOB.  With setup of the bed, he was able to transition to sitting with supervision.  He worked on sit to stand transitions with bariatric recliner placed in front of his knees for support.  He was able to complete sit to stand transitions with BUE support on the recliner with close supervision.  When attempting sit to stands with only one UE supported on the recliner or no UE use, he needed min to mod assist with increased pain noted in his right lower back.  Standing intervals ranged from approximately 30 seconds to 1.5 mins.  Had him also complete slight mini partial squat to stand for 2-3 intervals while standing on 2 occasions as well.  Also had him focus on eccentric contractions with transitions to sitting from standing.  Finished session with transfer back to supine and completion of BUE strengthening exercises with the medium resistance green therapy band.  He was able to complete 1 set of 15 reps for bilateral external rotation as well as shoulder horizontal abduction targeting the rotator cuff and posterior deltoids.  He was left with his mother present and the call button and phone in reach.    Therapy Documentation Precautions:   Precautions Precautions: Fall Precaution Comments: foley catheter, large buttocks wound, R heel wound Restrictions Weight Bearing Restrictions: No   Pain: Pain Assessment Pain Scale: Faces Pain Score: 8  Faces Pain Scale: Hurts little more Pain Type: Acute pain Pain Location: Back Pain Orientation: Right Pain Radiating Towards: back Pain Descriptors / Indicators: Aching Pain Frequency: Other (Comment) (DRESSING CHANGE) Pain Onset: With Activity Patients Stated Pain Goal: 3 Pain Intervention(s): Repositioned;Emotional support 2nd Pain Site Pain Score: 8 Pain Type: Acute pain Pain Location: Knee Pain Orientation: Right Pain Descriptors / Indicators: Aching Pain Frequency: Constant Pain Onset: On-going Patient's Stated Pain Goal: 3 Pain Intervention(s): Medication (See eMAR) ADL: See Care Tool Section for some details of mobility and selfcare  Therapy/Group: Individual Therapy  Aaralyn Kil OTR/L 08/20/2020, 12:36 PM

## 2020-08-20 NOTE — Progress Notes (Signed)
Physical Therapy Session Note  Patient Details  Name: Carl Hill MRN: [id number] Date of Birth: Jul 17, 1970  Today's Date: 08/20/2020 PT Individual Time: 1300-1410 PT Individual Time Calculation (min): 70 min   Short Term Goals: Week 2:  PT Short Term Goal 1 (Week 2): Pt will initiate gait training with RW. PT Short Term Goal 2 (Week 2): Pt will stand with RW x 2 minutes with min A x 2. PT Short Term Goal 3 (Week 2): Pt will tolerate >20 min of activity without incidence of pain.  Skilled Therapeutic Interventions/Progress Updates:    Pt received sidelying in bed, agreeable to PT session. No complaints of pain at rest, intermittent knee pain with mobility. Bed mobility Supervision with use of hospital bed features. Sit to stand to bari recliner with +2 Supervision and for stabilization of bari recliner. Stand pivot transfer to w/c with +3 min A overall and for equipment management with use of bari recliner. Dependent transport via w/c to/from dayroom for time and energy conservation. Stand pivot transfer w/c to mat table with bari recliner and +3 for equipment management and for safety. Sit to stand x 6 reps from mat table to RW with assist x 3 (one person to block knees), min A +2 for transfer. Pt able to perform standing marches x 10 reps with RW and +3 for safety. No instances of knee buckling this session, R knee ACE wrapped for support. Stand pivot transfer mat table to w/c with RW and +3 for safety with B knees blocked. Attempt to stand from w/c to RW, pt unable to stand due to chair seat height and unable to safely pull up on RW to stand even with assist x 3. Pt disappointed by inability to stand to RW from w/c this date, provided emotional support and encouragement to patient as he has made great progress this session with standing to RW from mat table and performing stand pivot transfer with RW. Stand pivot transfer back to bed with use of bari recliner and assist x 3 for safety. Sit to  supine Supervision. Pt left in sidelying in bed with needs in reach, mother present at end of session.  Therapy Documentation Precautions:  Precautions Precautions: Fall Precaution Comments: foley catheter, large buttocks wound, R heel wound Restrictions Weight Bearing Restrictions: No   Therapy/Group: Individual Therapy   Peter Congo, PT, DPT, CSRS  08/20/2020, 3:42 PM

## 2020-08-20 NOTE — Plan of Care (Signed)
  Problem: Consults Goal: Skin Care Protocol Initiated - if Braden Score 18 or less Description: WOC following patient Pt will understand plan of care for wounds Outcome: Progressing   Problem: RH BOWEL ELIMINATION Goal: RH STG MANAGE BOWEL WITH ASSISTANCE Description: STG Manage Bowel with min Assistance. Outcome: Progressing   Problem: RH BLADDER ELIMINATION Goal: RH STG MANAGE BLADDER WITH EQUIPMENT WITH ASSISTANCE Description: STG Manage Bladder With  foley catheter Equipment With min Assistance Outcome: Progressing   Problem: RH SKIN INTEGRITY Goal: RH STG SKIN FREE OF INFECTION/BREAKDOWN Description: Pt will have improvement of current wounds no further breakdown or infection while on rehab with min assist Outcome: Progressing Goal: RH STG MAINTAIN SKIN INTEGRITY WITH ASSISTANCE Description: STG Maintain Skin Integrity With min Assistance. Outcome: Progressing   Problem: RH SAFETY Goal: RH STG ADHERE TO SAFETY PRECAUTIONS W/ASSISTANCE/DEVICE Description: STG Adhere to Safety Precautions With min Assistance/Device. Outcome: Progressing Goal: RH STG DECREASED RISK OF FALL WITH ASSISTANCE Description: STG Decreased Risk of Fall With min Assistance. Outcome: Progressing   Problem: RH PAIN MANAGEMENT Goal: RH STG PAIN MANAGED AT OR BELOW PT'S PAIN GOAL Description: Pt will have pain that is <= 4/10 Outcome: Progressing   Problem: RH KNOWLEDGE DEFICIT GENERAL Goal: RH STG INCREASE KNOWLEDGE OF SELF CARE AFTER HOSPITALIZATION Description: Patient will demonstrate knowledge of medication management, dietary management, diabetes management, skin/wound care management with educational materials and handouts provided by staff. Outcome: Progressing

## 2020-08-21 DIAGNOSIS — M62838 Other muscle spasm: Secondary | ICD-10-CM

## 2020-08-21 DIAGNOSIS — M2351 Chronic instability of knee, right knee: Secondary | ICD-10-CM

## 2020-08-21 LAB — GLUCOSE, CAPILLARY
Glucose-Capillary: 112 mg/dL — ABNORMAL HIGH (ref 70–99)
Glucose-Capillary: 108 mg/dL — ABNORMAL HIGH (ref 70–99)
Glucose-Capillary: 89 mg/dL (ref 70–99)
Glucose-Capillary: 124 mg/dL — ABNORMAL HIGH (ref 70–99)

## 2020-08-21 MED ORDER — WHITE PETROLATUM EX OINT
TOPICAL_OINTMENT | CUTANEOUS | Status: AC
  Administered 2020-08-21: 0.2
  Filled 2020-08-21: qty 28.35

## 2020-08-21 NOTE — Progress Notes (Signed)
Occupational Therapy Session Note  Patient Details  Name: Carl Hill MRN: [id number] Date of Birth: 07/24/1970  Today's Date: 08/21/2020 OT Individual Time: 0900-1000 OT Individual Time Calculation (min): 60 min    Short Term Goals: Week 3:  OT Short Term Goal 1 (Week 3): Pt will don socks with max A using AE PRN OT Short Term Goal 2 (Week 3): Pt will perform stand pivot transfer to [employer] with max A+1 OT Short Term Goal 3 (Week 3): Pt will thread pants over BLE using AE PRN  Skilled Therapeutic Interventions/Progress Updates:    Pt resting in bed upon arrival. OT intervention with focus bed mobility, sit<>stand, standing balance, BADL training, activity tolerance, core stretching, and safety awareness to increase independence with BADLs. Bed mobility with supervision. Sitting balance with supervision. UB bathing/dressing with supervision. Sit<>stand using bari recliner with supervision. Pt engaged in core stretching following pt c/o back and trunk pain. Trunk rotation and flexion for stretching with limited relief. Pt returned to supine and demonstrated BUE therex with green theraband.   Therapy Documentation Precautions:  Precautions Precautions: Fall Precaution Comments: foley catheter, large buttocks wound, R heel wound Restrictions Weight Bearing Restrictions: No  Pain: Pain Assessment Pain Scale: 0-10 Pain Score: 6  Pain Type: Acute pain Pain Location: Back Pain Orientation: Lower Pain Radiating Towards: buttocks Pain Descriptors / Indicators: Aching Pain Frequency: Intermittent Pain Onset: With Activity Patients Stated Pain Goal: 3 Pain Intervention(s): RN aware and repositioned     Therapy/Group: Individual Therapy  [name] 08/21/2020, 10:56 AM

## 2020-08-21 NOTE — Progress Notes (Signed)
Occupational Therapy Weekly Progress Note  Patient Details  Name: Carl Hill MRN: [id number] Date of Birth: Sep 16, 1970  Beginning of progress report period: August 13, 2020 End of progress report period: August 21, 2020  Patient has met 2 of 3 short term goals. Pt continues to make steady progress with UB BADLs, bed mobility, sit<>stand with bari recliner for support, standing balance, and BUE strengthening. Pt independent with BUE strengthening exercises with green theraband. Supervision for bed mobility, sitting balance, and UB bathing/dressing seated EOB. Sit<>stand with use of bari recliner with CGA. Pt able to side step standing at EOB with support from [employer]. Pt able to perform mini squats and marching at EOB. Pt is max A for LB bathing at bed level.   Patient continues to demonstrate the following deficits: muscle weakness, decreased cardiorespiratoy endurance, unbalanced muscle activation and decreased motor planning and decreased sitting balance, decreased standing balance, decreased postural control and decreased balance strategies and therefore will continue to benefit from skilled OT intervention to enhance overall performance with BADL and Reduce care partner burden.  Patient progressing toward long term goals..  Continue plan of care.  OT Short Term Goals Week 2:  OT Short Term Goal 1 (Week 2): Patient will complete sit<>stand with max A +2 in prep for transfer OT Short Term Goal 1 - Progress (Week 2): Met OT Short Term Goal 2 (Week 2): Pt will complete LB bathing tasks with max A sitting EOB using AE PRN OT Short Term Goal 2 - Progress (Week 2): Met OT Short Term Goal 3 (Week 2): Pt will don socks with max A using AE PRN OT Short Term Goal 3 - Progress (Week 2): Progressing toward goal Week 3:  OT Short Term Goal 1 (Week 3): Pt will don socks with max A using AE PRN OT Short Term Goal 2 (Week 3): Pt will perform stand pivot transfer to [facility] with max A+1 OT Short Term  Goal 3 (Week 3): Pt will thread pants over BLE using AE PRN     Carl Hill 08/21/2020, 6:28 AM

## 2020-08-21 NOTE — Progress Notes (Signed)
PROGRESS NOTE   Subjective/Complaints:  Pt to receive knee sleeves today, hopefully. Mother has surgery 4/29- to go to fathers until she's recovered.  LBM this AM.  Doesn't want to reduce oxy dose.  Plans on going home with foley- will need him to f/u with Urology- as a result and needs bariatric BSC.   ROS:   Pt denies SOB, abd pain, CP, N/V/C/D, and vision changes   Objective:   No results found. Recent Labs    08/19/20 0726  WBC 6.5  HGB 9.9*  HCT 32.6*  PLT 324   Recent Labs    08/19/20 0726  NA 138  K 3.8  CL 102  CO2 30  GLUCOSE 116*  BUN 6  CREATININE 0.87  CALCIUM 8.7*    Intake/Output Summary (Last 24 hours) at 08/21/2020 1936 Last data filed at 08/21/2020 1700 Gross per 24 hour  Intake 720 ml  Output 4700 ml  Net -3980 ml     Pressure Injury 08/06/20 Heel Right Stage 2 -  Partial thickness loss of dermis presenting as a shallow open injury with a red, pink wound bed without slough. (Active)  08/06/20 0240  Location: Heel  Location Orientation: Right  Staging: Stage 2 -  Partial thickness loss of dermis presenting as a shallow open injury with a red, pink wound bed without slough.  Wound Description (Comments):   Present on Admission: Yes    Physical Exam: Vital Signs Blood pressure 114/63, pulse 91, temperature 98.1 F (36.7 C), resp. rate 18, height 5\' 10"  (1.778 m), weight (!) 239.7 kg, SpO2 95 %.       General: awake, alert, appropriate, on bariatric low air loss mattress,  NAD HENT: conjugate gaze; oropharynx moist CV: regular rate; no JVD Pulmonary: CTA B/L; no W/R/R- good air movement GI: soft, NT, ND, (+)BS Psychiatric: appropriate Neurological: Ox3 Skin: Warm and dry. Has post op wound 26x17 cm on backside- NOT pressure ulcer Has a  A little dried blood scratch on R anterior shin- looks well. -   Left 2nd toe- clean, really almost resolved. dressing removed R heel- dry  and cracked skin- over dry- a spot that's a large thick scab R shin has 2 scrapes on it Musc: No edema in extremities.  No tenderness in extremities. Musculoskeletal: Bilateral upper extremities: 5/5 throughout Bilateral lower extremities: Hip flexion grossly 4+/5, distally 5/5   Assessment/Plan: 1. Functional deficits which require 3+ hours per day of interdisciplinary therapy in a comprehensive inpatient rehab setting.  Physiatrist is providing close team supervision and 24 hour management of active medical problems listed below.  Physiatrist and rehab team continue to assess barriers to discharge/monitor patient progress toward functional and medical goals  Care Tool:  Bathing    Body parts bathed by patient: Right arm,Left arm,Chest,Abdomen,Face,Right upper leg,Left upper leg   Body parts bathed by helper: Front perineal area,Buttocks,Right lower leg,Left lower leg     Bathing assist Assist Level: Maximal Assistance - Patient 24 - 49%     Upper Body Dressing/Undressing Upper body dressing   What is the patient wearing?: Pull over shirt    Upper body assist Assist Level: Supervision/Verbal cueing  Lower Body Dressing/Undressing Lower body dressing      What is the patient wearing?: Incontinence brief     Lower body assist Assist for lower body dressing: Maximal Assistance - Patient 25 - 49%     Toileting Toileting    Toileting assist Assist for toileting: Dependent - Patient 0%     Transfers Chair/bed transfer  Transfers assist  Chair/bed transfer activity did not occur: Safety/medical concerns  Chair/bed transfer assist level: 2 Helpers     Locomotion Ambulation   Ambulation assist   Ambulation activity did not occur: Safety/medical concerns  Assist level: 2 helpers Assistive device: Walker-rolling (+3 with RW and w/c follow) Max distance: 20   Walk 10 feet activity   Assist  Walk 10 feet activity did not occur: Safety/medical  concerns  Assist level: 2 helpers Assistive device: Walker-rolling   Walk 50 feet activity   Assist Walk 50 feet with 2 turns activity did not occur: Safety/medical concerns         Walk 150 feet activity   Assist Walk 150 feet activity did not occur: Safety/medical concerns         Walk 10 feet on uneven surface  activity   Assist Walk 10 feet on uneven surfaces activity did not occur: Safety/medical concerns         Wheelchair     Assist Will patient use wheelchair at discharge?: Yes Type of Wheelchair: Manual Wheelchair activity did not occur: Safety/medical concerns         Wheelchair 50 feet with 2 turns activity    Assist    Wheelchair 50 feet with 2 turns activity did not occur: Safety/medical concerns       Wheelchair 150 feet activity     Assist  Wheelchair 150 feet activity did not occur: Safety/medical concerns       Blood pressure 114/63, pulse 91, temperature 98.1 F (36.7 C), resp. rate 18, height 5\' 10"  (1.778 m), weight (!) 239.7 kg, SpO2 95 %.  Medical Problem List and Plan: 1.  Debility secondary to septic shock from necrotizing soft tissue infection of perianal, gluteal, and perineal tissues.  3/16- will try ACE wrap to stabilize R knee buckling-  3/17- ACE wrap slipped down- con't PT and OT for working esp on walking/transfers and ADLs    3/23- knee sleeves today?- con't PT and OT 2.  Antithrombotics: -DVT/anticoagulation:  Pharmaceutical:   Xarelto  3/15- pt doesn't want Coumadin and wants to continue on Xarelto- after prolonged d/w pt.              -antiplatelet therapy: N/A 3. Pain Management:  Continue Lyrica and elavil for neuropathy.              Oxycontin 15 mg bid with 10 mg prn for breakthrough pain.   Added Robaxin 500 mg QID prn for muscle spasms.    3/14- no IV anymore- doing ok with PO meds for dressing changes- con't regimen  3/15- getting the PO Dilaudid for wound care- otherwise, pain  controlled  3/16- changed robaxin to 750 mg q6 hours prn  3/20 pain improved. Continue anusol for ano-rectal discomfort  3/21- trying ot wean Prn Oxycodone, con't Oxycontin and Dilaudid for wound care changes  3/23- doesn't want to reduce Oxycodone dose 4. Mood: LCSW to follow for evaluation and support.              -antipsychotic agents: N/A 5. Neuropsych: This patient is capable of making decisions on his  own behalf. 6. Skin/Wound Care: Air mattress for pressure relief measures.              --will need local care frequently to keep sacral wound bed clean.             --Continue foley for now.  3/10- con't sacral wound care daily and prn  Con't low air loss mattress and WOC note in chart- isn't pressure- is post op wound  3/19 added hemorrhoidal cream to assist with any pain which might be ano-rectal/hemorrhoidal  3/21- has helped Sx's- con't regimen  3/22- still needs low air loss mattress due to post op wounds on backside-  7. Fluids/Electrolytes/Nutrition: Monitor I/Os.  8. Necrotizing fascitis: Has been on Augmentin since 02/11--> to continue for 6 months per ID 9. T2DM with neuropathy: Hgb A1c- 9.6 (down from 13 in Aug) Will continue to monitor BS ac/hs and use SSI for elevated BS             --used Farxiga, metformin and Victoza 1.8.             --Continue Levemir bid and titrate as indicated  3/14- 99-129- well controlled- con't regimen  3/15- BGs great- almost too good- 78-104 in last 24 hours- if goes down any more, might need to back off- also, will have nurse decrease AM dose of Novolog to 9 units, not 18 units due to BG of 83 and no carbs for breakfast.   3/22- BGs 97-130s- doing great- will send home on current regimen- need to teach pt how to do insulin injections 10 Hyponatremia: Likely due to multiple BM yesterday.              Sodium 138 on 3/10, continue to monitor 11. Hypokalemia:    Potassium 3.9 on 3/10 after supplementation  3/21- has maintained K+ level at 3.8-  con't to monitor 12. Proximal A FIb: Monitor HR tid--continue Cardizem 240 mg and amiodarone for rate control.              --monitor for symptoms with increase in activity.  3/16- HR 99 this AM- will monitor for RVR- esp with activity   3/17- HR back down to low 90s this AM- con't to monitor 13. OIC:              Discontinued colace to help bulk stools as now having loose stools.  3/20 moving bowels regularly now  3/21- was full of stool- got cleaned out-   3/22- having daily BMs- con't regimen 14. Chronic right heel ulcer: Continue to paint wound daily with iodine.   3/16- is Stage II- healing 15.  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 Will need bariatric equipment.  3/14- BMI down to 74.68  3/16- BMI down to 74. 12  3/17- BMI up to 75.82 this AM- still lower than admission, but not sure why would have changed 'so fast"- scale off? 16. Trach site  3/9- healing- but leave open to air.  3/11- almost completely healed- scab coming off- and healthy tissue   underneath.  3/15- resolved/healed 17. Foley for urinary retention  3/14-  Will keep Foley- got ok by head nurse, because he has such vast post op wounds from necrotizing fasciitis- con't for now  3/19- will con't foley since has such open wounds- to prevent additonal skin breakdown  3/21- will con't foley- due to open wounds and to prevent additional skin breakdown  3/23- pt wants to go home with foley- will  need f/u with Urology to get out- since bladder, being a muscle, will be weak. Will need bariatric BSC if at possible.   19. Disposition:  at baseline, patient received significant assistance from his mother, aged 39. He required her assistance to wipe stool after BM due to body habitus. He was able to ambulate to bathroom and back on his own. Plan is for discharge home to father, who is a IT sales professional, and who can provide more physical support to patient. Goal is ModA level for ambulation within his room. Plan for  approx 2-3 week stay to reach this goal. He is currently demonstrating improved standing tolerance. Fuller Song discussed with father.   3/22- pt insists going home with mother- says she can do wound care better- will go to fathers when mother has surgery.    3/23- mother's surgery on 4/29     LOS: 15 days A FACE TO FACE EVALUATION WAS PERFORMED  Megan Lovorn 08/21/2020, 7:36 PM

## 2020-08-21 NOTE — Progress Notes (Signed)
Physical Therapy Weekly Progress Note  Patient Details  Name: Carl Hill MRN: [id number] Date of Birth: Dec 06, 1970  Beginning of progress report period: August 14, 2020 End of progress report period: August 21, 2020  Today's Date: 08/21/2020 PT Individual Time: 1330-1438, 1105-1200 PT Individual Time Calculation (min): 68 min, 55 min  Patient has met 1 of 3 short term goals.  Bed mobility with supervision and bed features. Sit to stand with RW and min A of 2, with knees blocked for safety. Gait up to 20 ft with RW and min A, blocked knees, and w/c follow. Stand pivot transfer with RW and min A of 2. Pt continues to demonstrate improved endurance and LE strength.  Patient continues to demonstrate the following deficits muscle weakness, decreased cardiorespiratoy endurance and decreased standing balance and therefore will continue to benefit from skilled PT intervention to increase functional independence with mobility.  Patient progressing toward long term goals..  Continue plan of care.  PT Short Term Goals Week 2:  PT Short Term Goal 1 (Week 2): Pt will initiate gait training with RW. PT Short Term Goal 1 - Progress (Week 2): Met PT Short Term Goal 2 (Week 2): Pt will stand with RW x 2 minutes with min A x 2. PT Short Term Goal 2 - Progress (Week 2): Progressing toward goal PT Short Term Goal 3 (Week 2): Pt will tolerate >20 min of activity without incidence of pain. PT Short Term Goal 3 - Progress (Week 2): Progressing toward goal Week 3:  PT Short Term Goal 1 (Week 3): Pt will stand with RW x 2 minutes with min A x 2. PT Short Term Goal 2 (Week 3): Pt will tolerate >20 min of activity without incidence of pain. PT Short Term Goal 3 (Week 3): Pt will perform gait x 50 with RW and min A of 2  Skilled Therapeutic Interventions/Progress Updates:    AM session: Pt received supine in bed and agreeable to therapy. Donned knee wrap with total A. Pt c/o R low back pain throughout  session, which worsened with extension, R rotation and unilateral passive accessories. Supine<>sit with HOB slightly elevated. Pt stood from EOB with bari recliner with supervision x 2 and walked 10 ft with recliner and w/c follow. Pt performed stand pivot transfer with RW w/c>mat table with blocked knees, min A x 2, blocked knees x1. Pt performed STS from mat table and w/c with RW and min A x 2 and with knees blocked. Pt walked with RW and min A, w/c follow, blocked knees, assist of 3. 3 gait trials, x10 ft, x15 ft, x20 ft. Pt performeed stand pivot with bari recliner on returning to room and returned to supine in bed. Pt remained with all needs in reach.  PM session: Pt received supine in bed and agreeable to therapy. Supine<>sit with supervision and use of bed features. Attempted sitting with bed flat and use of hand rail only, without success. Pt performed STS with bari recliner and supervision x 2 for safety. Pt experienced R lower back pain that improved with soft tissue mobilization and unilateral passive accessories. Pt experienced lightheadedness and extreme fatigue with standing. Seated BP of 123/76 and 120/91. Pt stated that lightheadedness worsened when standing completely upright. Pt performed 3 x 10 seated pushups on seat of chair. Pt returned to bed with supervision and was agreeable to trying to lay on his R side to improve back pain and was left with all needs in reach.  Therapy Documentation Precautions:  Precautions Precautions: Fall Precaution Comments: foley catheter, large buttocks wound, R heel wound Restrictions Weight Bearing Restrictions: No   Therapy/Group: Individual Therapy  Sharen Counter 08/21/2020, 12:19 PM

## 2020-08-21 NOTE — Consult Note (Signed)
Neuropsychological Consultation   Patient:   Carl Hill   DOB:   14-Aug-1970  MR Number:  [id number]  Location:  [facility] [address] CENTER A [address] [id number] [place] [address] Dept: [phone] Loc: [phone]           Date of Service:   08/21/2020  Start Time:   3 PM End Time:   4 PM  Provider/Observer:  Arley Phenix, Psy.D.       Clinical Neuropsychologist       Billing Code/Service: [id number]  Chief Complaint:    Carl Hill is a 50 year old male with history of type 2 diabetes that for an extended period of time was poorly controlled, PAF, chronic right knee ulcer, super morbid obesity with BMI 78.  Patient was admitted on 06/10/2020 with fever, chills, leukocytosis and AKA from septic shock due to necrotizing soft tissue infection of perianal, gluteal and perineal tissues.  Patient had significant infection with surgical interventions for debridement.  Patient required trach on 1/26 and was weaned to ATC and tolerated regular diet.  Patient self decannulated accidentally on 2/24 and respiratory status was stable.  Patient has progressively shown improvements with therapy now that he is on the CIR due to functional decline.  There are some complicating psychosocial features including his mother who has been his caretaker and would take care of him after discharge being scheduled for her own surgery at the end of April.  Reason for Service:  Patient was referred for neuropsychological consultation due to initial anxiety and coping issues on top of his debilitated state.  Below is the HPI for the current admission.  HPI: Carl Hill. Lambertson is a 50 year old male with history of T2DM, PAF (Dr. Boneta Lucks), chronic right heel ulcer (Dr. Sharol Harness), supermorbid obesity--BMI 78 who was admitted on 06/10/20 with fever, chills, leucocytosis and AKI from septic shock due to necrotizing soft tissue infection of  perianal, gluteal and perineal tissues. He was taken to OR for excisional debridement the same day by Dr. Sophronia Simas. Later that day he developed A fib with RVR/VT arrest s/p CPR.  He was taken back to OR the next day due to further necrosis requiring debridement and wound cultures showed group B streptococcus actinomyces, E coli and abundant peptostreptococcus species.   He was treated with broad spectrum antibiotics but continued to have fevers up to 105 and Dr. Daiva Eves recommended consolidating antibiotics to [facility] for IV antibiotics X  Weeks and has been transitioned to Augmentin X 6 months.    He required tracheostomy on 01/26, has been weaned to ATC and tolerating regular diet. He self decannulated accidentally on 02/24 and respiratory status stable. Surgery recommended wet to dry dressing with  hydrotherapy which has helped to clean up wound bed. CCS has deferred to [facility] for wound care and patient to continue wound local measures with dressing change for management of moderate yellow exudate with wet to dry dressing and flexiseal for bowel management. Nephrology consulted for input on volume overload with hypernatremia and AKI --> felt to be due to AKI/ATN with osmotic diuresis and has resolved with free water and resolution of polyuria.   On coumadin now with amiodarone/cardizem for management of A fib. Deep tissue injury bilateral heels treated with betadine. OIC with fecal impaction managed with SMOG enema/mag citrate on 03/06 and bowel program has been augmented. He continues to require IV dilaudid for mobility--being weaned down to 0.5-0.75  mg tid prn.   Damp to dry dressing changes ongoing to sacrum and rectal tube has been out for past week. Foley remains in place. Blood sugars better controlled on levemir. Therapy has been ongoing with patient showing ability to WB on BLE and stand for 30-45 seconds X 3 with 2-3 person assist. CIR recommended due to functional decline. He  complains of pain at sacral pressure injury and shows pictures of its healing process.   Current Status:  Patient was awake and alert and well oriented.  His mood was quite positive and he was euthymic throughout the visit.  Patient acknowledged times of anxiety and worry particularly around how his mother would be able to care for him.  However, as the patient is improved and is now beginning to be able to do self transfers and walk some with close supervision his mood has been improving.  The patient denies any significant anxiety at this time but does acknowledge his mother's worry about how she will be able to take care of him.  Patient is quite positive about changes he has made particularly with his A1c and he has seen progressive healing of his deep wound.  Behavioral Observation: Carl Hill  presents as a 50 y.o.-year-old Right Caucasian Male who appeared his stated age. his dress was Appropriate and he was Well Groomed and his manners were Appropriate to the situation.  his participation was indicative of Appropriate and Redirectable behaviors.  There were physical disabilities noted.  he displayed an appropriate level of cooperation and motivation.     Interactions:    Active Redirectable  Attention:   within normal limits and attention span and concentration were age appropriate  Memory:   within normal limits; recent and remote memory intact  Visuo-spatial:  not examined  Speech (Volume):  normal  Speech:   normal; normal  Thought Process:  Coherent and Tangential  Though Content:  WNL; not suicidal and not homicidal  Orientation:   person, place, time/date and situation  Judgment:   Fair  Planning:   Fair  Affect:    Appropriate  Mood:    Euthymic  Insight:   Fair  Medical History:   Past Medical History:  Diagnosis Date  . 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 Asthma   . Atrial fibrillation (HCC)   . Diabetes mellitus without complication (HCC)   . History of  cardioversion    3 times   . Hypertension   . Sepsis due to Streptococcus, group B (HCC) 06/22/2020         Patient Active Problem List   Diagnosis Date Noted  . Hypoglycemia 08/15/2020  . Muscle spasms of both lower extremities 08/15/2020  . Recurrent knee instability, 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  . Super-super obese (HCC)   . Urinary retention   . Hypokalemia   . Labile blood glucose   . Diabetic peripheral neuropathy (HCC)   . Sacral pain   . Debility 08/06/2020  . Sepsis due to Streptococcus, group B (HCC) 06/22/2020  . 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 Acute respiratory failure (HCC)   . Sepsis (HCC) 06/10/2020  . Fournier gangrene 06/10/2020  . DKA (diabetic ketoacidosis) (HCC) 06/10/2020  . AKI (acute kidney injury) (HCC) 06/10/2020  . Diabetic ulcer of heel (HCC) 06/10/2020  . Atrial fibrillation, chronic (HCC) 06/10/2020  . Essential hypertension 06/10/2020  . Dyslipidemia 06/10/2020  . Acquired hypothyroidism 06/10/2020  . Chronic pain disorder 06/10/2020  . Morbid obesity with BMI of 60.0-69.9, adult (HCC) 06/10/2020   Psychiatric  History:  No prior psychiatric history noted.  Family Med/Psych History:  Family History  Problem Relation Age of Onset  . Diabetes Mother   . Healthy Brother   . Breast cancer Paternal Grandmother   . Pancreatic cancer Paternal Uncle     Impression/DX:  RAVEN HARMES is a 50 year old male with history of type 2 diabetes that for an extended period of time was poorly controlled, PAF, chronic right knee ulcer, super morbid obesity with BMI 78.  Patient was admitted on 06/10/2020 with fever, chills, leukocytosis and AKA from septic shock due to necrotizing soft tissue infection of perianal, gluteal and perineal tissues.  Patient had significant infection with surgical interventions for debridement.  Patient required trach on 1/26 and was weaned to ATC and tolerated regular diet.  Patient self decannulated accidentally on 2/24 and respiratory status  was stable.  Patient has progressively shown improvements with therapy now that he is on the CIR due to functional decline.  There are some complicating psychosocial features including his mother who has been his caretaker and would take care of him after discharge being scheduled for her own surgery at the end of April.  Patient was awake and alert and well oriented.  His mood was quite positive and he was euthymic throughout the visit.  Patient acknowledged times of anxiety and worry particularly around how his mother would be able to care for him.  However, as the patient is improved and is now beginning to be able to do self transfers and walk some with close supervision his mood has been improving.  The patient denies any significant anxiety at this time but does acknowledge his mother's worry about how she will be able to take care of him.  Patient is quite positive about changes he has made particularly with his A1c and he has seen progressive healing of his deep wound.  Disposition/Plan:  Day we worked on coping and adjustment although the patient appears to be doing very well from a mood standpoint.  The patient has had experience managing significant lowness he had a significant wound previously on his knee.  Patient was ready to assign responsibility for taking care of himself to his mother and is likely he has relied on her for a good deal of support as the patient is disabled from many activities due to his super morbid obesity status.  Patient has had great difficulty managing his type 2 diabetes which has been poorly controlled in the past with significant elevations in his blood glucose levels as well as severely elevated A1c levels.  Diagnosis:    Debility, super super morbid obesity and significant wound with infection.        Electronically Signed   _______________________ Arley Phenix, Psy.D. Clinical Neuropsychologist

## 2020-08-22 DIAGNOSIS — E11628 Type 2 diabetes mellitus with other skin complications: Secondary | ICD-10-CM

## 2020-08-22 LAB — GLUCOSE, CAPILLARY
Glucose-Capillary: 89 mg/dL (ref 70–99)
Glucose-Capillary: 122 mg/dL — ABNORMAL HIGH (ref 70–99)
Glucose-Capillary: 115 mg/dL — ABNORMAL HIGH (ref 70–99)
Glucose-Capillary: 139 mg/dL — ABNORMAL HIGH (ref 70–99)

## 2020-08-22 NOTE — Progress Notes (Signed)
Occupational Therapy Note  Patient Details  Name: Carl Hill MRN: [id number] Date of Birth: 08/17/1970  Today's Date: 08/22/2020 OT Missed Time: 30 Minutes Missed Time Reason: Nursing care  Pt missed 30 mins skilled OT services 2/2 nursing care for wounds. Will attempt to visit pt later.   Lavone Neri [facility] 08/22/2020, 1:19 PM

## 2020-08-22 NOTE — Progress Notes (Signed)
Occupational Therapy Session Note  Patient Details  Name: Carl Hill MRN: [id number] Date of Birth: May 24, 1971  Today's Date: 08/22/2020 OT Individual Time: 0900-1000 OT Individual Time Calculation (min): 60 min    Short Term Goals: Week 3:  OT Short Term Goal 1 (Week 3): Pt will don socks with max A using AE PRN OT Short Term Goal 2 (Week 3): Pt will perform stand pivot transfer to [facility] with max A+1 OT Short Term Goal 3 (Week 3): Pt will thread pants over BLE using AE PRN  Skilled Therapeutic Interventions/Progress Updates:    Pt resting in bed upon arrival. Initial OT focus on bed mobility, sitting balance, BADLs seated unsupported, and activity tolerance. Bed mobility and UB bathing dressing with supervision. Pt required UE support when sitting to complete oral care. Kinesio tape reapplied to Rt lower back and myofascial release for lower back with improvement noted by patient. Pt engaged in active stretching of lower back while leaning on bari recliner. Sit<>stand X 4 with supervision using handles on bari recliner. Pt returned to supine and remained in bed witl all needs within reach.  Therapy Documentation Precautions:  Precautions Precautions: Fall Precaution Comments: foley catheter, large buttocks wound, R heel wound Restrictions Weight Bearing Restrictions: No   Pain: Pain Assessment Pain Scale: 0-10 Pain Score: 5    Therapy/Group: Individual Therapy  [name] 08/22/2020, 1:02 PM

## 2020-08-22 NOTE — Progress Notes (Signed)
Physical Therapy Session Note  Patient Details  Name: Carl Hill MRN: [id number] Date of Birth: [birth year]  Today's Date: 08/22/2020 PT Individual Time: 1440-1540 PT Individual Time Calculation (min): 60 min   Short Term Goals: Week 3:  PT Short Term Goal 1 (Week 3): Pt will stand with RW x 2 minutes with min A x 2. PT Short Term Goal 2 (Week 3): Pt will tolerate >20 min of activity without incidence of pain. PT Short Term Goal 3 (Week 3): Pt will perform gait x 50 with RW and min A of 2  Skilled Therapeutic Interventions/Progress Updates:    Pt received supine in bed, agreeable to PT session. No complaints of pain at rest, has onset of R lower back pain with standing. Utilized soft tissue mobilizations, position changes, and stretching for pain management. Bed mobility Supervision with use of hospital bed features. Sit to stand with +2 throughout session (CGA for blocking of knees, mod A to stand to RW). Stand pivot transfer bed to w/c with RW and +2 for safety. Pt has scissoring of BLE during transfer and has near fall, able to be safely lowered into w/c. Pt able to stand to bari-recliner with +2 for repositioning in w/c. Dependent transfer via w/c to/from therapy gym for time and energy conservation. Stand pivot transfer w/c to mat table with RW and +3 for safety (+1 guarding knees, +2 CGA). Session focus on standing tolerance with RW while bowling with RUE. Pt is +2 (mod A to stand, CGA for blocking knees). While in standing pt braces BLE against mat table, able to let go of RW with RUE for bowling, 5 x 2 frames with extended seated rest break between frames. Stand pivot transfer back to w/c then back to bed with RW in similar manner as stated above. No further instances of near falls during remainder of session. Sit to supine Supervision. Pt left in sidelying in bed with needs in reach, dad present at end of session. Cotreatment session with LRT.  Therapy Documentation Precautions:   Precautions Precautions: Fall Precaution Comments: foley catheter, large buttocks wound, R heel wound Restrictions Weight Bearing Restrictions: No   Therapy/Group: Individual Therapy   Peter Congo, PT, DPT, CSRS  08/22/2020, 3:54 PM

## 2020-08-22 NOTE — Progress Notes (Signed)
Recreational Therapy Session Note  Patient Details  Name: Carl Hill MRN: [id number] Date of Birth: [birth year] Today's Date: 08/22/2020  Pain: c/o intermittent back spasms, repositioning assisted in relief  Skilled Therapeutic Interventions/Progress Updates: Session focused on activity tolerance, standing tolerance/dynamic balance, energy/safety awareness during co-treat with PT.  Pt stood with +2 assist for bowling activity.  Pt stood with 1UE support to bowl a full frame before needing a seated rest break.  Pt bowled a total of 5 frames with prolonged rest break in between.  Pt enjoyed this session. Therapy/Group: Co-Treatment Weylin Plagge 08/22/2020, 3:51 PM

## 2020-08-22 NOTE — Progress Notes (Signed)
PROGRESS NOTE   Subjective/Complaints:  Pt to receive knee sleeves today hopefully- still hasn't received them. Walked with RW yesterday for 20+ ft x2- was very excited, but upset with self didn't walk more in 2nd session- was too tired and had dizziness and lightheadedness. BP was OK.   Sounds like was exhausted.   ROS:   Pt denies SOB, abd pain, CP, N/V/C/D, and vision changes   Objective:   No results found. No results for input(s): WBC, HGB, HCT, PLT in the last 72 hours. No results for input(s): NA, K, CL, CO2, GLUCOSE, BUN, CREATININE, CALCIUM in the last 72 hours.  Intake/Output Summary (Last 24 hours) at 08/22/2020 0909 Last data filed at 08/22/2020 0700 Gross per 24 hour  Intake 720 ml  Output 5350 ml  Net -4630 ml     Pressure Injury 08/06/20 Heel Right Stage 2 -  Partial thickness loss of dermis presenting as a shallow open injury with a red, pink wound bed without slough. (Active)  08/06/20 0240  Location: Heel  Location Orientation: Right  Staging: Stage 2 -  Partial thickness loss of dermis presenting as a shallow open injury with a red, pink wound bed without slough.  Wound Description (Comments):   Present on Admission: Yes    Physical Exam: Vital Signs Blood pressure 138/87, pulse 80, temperature 98.6 F (37 C), resp. rate 18, height 5\' 10"  (1.778 m), weight (!) 239.7 kg, SpO2 94 %.        General: awake, alert, appropriate, sitting up in bariatric low air loss bed, NAD HENT: conjugate gaze; oropharynx moist CV: regular rate -rate controlled; no JVD Pulmonary: CTA B/L; no W/R/R- good air movement GI: soft, NT, ND, (+)BS- protuberant- BMI 75+ Psychiatric: appropriate- interactive Neurological: Ox3 Skin: Warm and dry. Has post op wound 26x17 cm on backside- NOT pressure ulcer Has a  A little dried blood scratch on R anterior shin- looks well. -   Left 2nd toe- clean, really almost resolved.  dressing removed R heel- dry and cracked skin- over dry- a spot that's a large thick scab- but better today- less cracked appearing with vaseline applied.  R shin has 2 scrapes on it Musc: No edema in extremities.  No tenderness in extremities. Musculoskeletal: Bilateral upper extremities: 5/5 throughout Bilateral lower extremities: Hip flexion grossly 4+/5, distally 5/5   Assessment/Plan: 1. Functional deficits which require 3+ hours per day of interdisciplinary therapy in a comprehensive inpatient rehab setting.  Physiatrist is providing close team supervision and 24 hour management of active medical problems listed below.  Physiatrist and rehab team continue to assess barriers to discharge/monitor patient progress toward functional and medical goals  Care Tool:  Bathing    Body parts bathed by patient: Right arm,Left arm,Chest,Abdomen,Face,Right upper leg,Left upper leg   Body parts bathed by helper: Front perineal area,Buttocks,Right lower leg,Left lower leg     Bathing assist Assist Level: Maximal Assistance - Patient 24 - 49%     Upper Body Dressing/Undressing Upper body dressing   What is the patient wearing?: Pull over shirt    Upper body assist Assist Level: Supervision/Verbal cueing    Lower Body Dressing/Undressing Lower body dressing  What is the patient wearing?: Incontinence brief     Lower body assist Assist for lower body dressing: Maximal Assistance - Patient 25 - 49%     Toileting Toileting    Toileting assist Assist for toileting: Dependent - Patient 0%     Transfers Chair/bed transfer  Transfers assist  Chair/bed transfer activity did not occur: Safety/medical concerns  Chair/bed transfer assist level: 2 Helpers     Locomotion Ambulation   Ambulation assist   Ambulation activity did not occur: Safety/medical concerns  Assist level: 2 helpers Assistive device: Walker-rolling (+3 with RW and w/c follow) Max distance: 20   Walk  10 feet activity   Assist  Walk 10 feet activity did not occur: Safety/medical concerns  Assist level: 2 helpers Assistive device: Walker-rolling   Walk 50 feet activity   Assist Walk 50 feet with 2 turns activity did not occur: Safety/medical concerns         Walk 150 feet activity   Assist Walk 150 feet activity did not occur: Safety/medical concerns         Walk 10 feet on uneven surface  activity   Assist Walk 10 feet on uneven surfaces activity did not occur: Safety/medical concerns         Wheelchair     Assist Will patient use wheelchair at discharge?: Yes Type of Wheelchair: Manual Wheelchair activity did not occur: Safety/medical concerns         Wheelchair 50 feet with 2 turns activity    Assist    Wheelchair 50 feet with 2 turns activity did not occur: Safety/medical concerns       Wheelchair 150 feet activity     Assist  Wheelchair 150 feet activity did not occur: Safety/medical concerns       Blood pressure 138/87, pulse 80, temperature 98.6 F (37 C), resp. rate 18, height 5\' 10"  (1.778 m), weight (!) 239.7 kg, SpO2 94 %.  Medical Problem List and Plan: 1.  Debility secondary to septic shock from necrotizing soft tissue infection of perianal, gluteal, and perineal tissues.  3/16- will try ACE wrap to stabilize R knee buckling-  3/17- ACE wrap slipped down- con't PT and OT for working esp on walking/transfers and ADLs    3/24- walked yesterday x 20 ft with 2- for first time- con't PT and OT 2.  Antithrombotics: -DVT/anticoagulation:  Pharmaceutical:   Xarelto  3/15- pt doesn't want Coumadin and wants to continue on Xarelto- after prolonged d/w pt.              -antiplatelet therapy: N/A 3. Pain Management:  Continue Lyrica and elavil for neuropathy.              Oxycontin 15 mg bid with 10 mg prn for breakthrough pain.   Added Robaxin 500 mg QID prn for muscle spasms.    3/16- changed robaxin to 750 mg q6 hours  prn  3/23- doesn't want to reduce Oxycodone dose  3/24- pain controlled except muscle spasms- reassured pt Robaxin won't add to constipation and to take when offered/q6 hours prn for R low back pain 4. Mood: LCSW to follow for evaluation and support.              -antipsychotic agents: N/A 5. Neuropsych: This patient is capable of making decisions on his own behalf. 6. Skin/Wound Care: Air mattress for pressure relief measures.              --will need local care frequently  to keep sacral wound bed clean.             --Continue foley for now.  3/10- con't sacral wound care daily and prn  Con't low air loss mattress and WOC note in chart- isn't pressure- is post op wound  3/19 added hemorrhoidal cream to assist with any pain which might be ano-rectal/hemorrhoidal  3/21- has helped Sx's- con't regimen  3/22- still needs low air loss mattress due to post op wounds on backside-  7. Fluids/Electrolytes/Nutrition: Monitor I/Os.  8. Necrotizing fascitis: Has been on Augmentin since 02/11--> to continue for 6 months per ID 9. T2DM with neuropathy: Hgb A1c- 9.6 (down from 13 in Aug) Will continue to monitor BS ac/hs and use SSI for elevated BS             --used Farxiga, metformin and Victoza 1.8.             --Continue Levemir bid and titrate as indicated  3/14- 99-129- well controlled- con't regimen  3/15- BGs great- almost too good- 78-104 in last 24 hours- if goes down any more, might need to back off- also, will have nurse decrease AM dose of Novolog to 9 units, not 18 units due to BG of 83 and no carbs for breakfast.   3/22- BGs 97-130s- doing great- will send home on current regimen- need to teach pt how to do insulin injections  3/24- BGs 89-124- great control- con't regimen- don't change back to home regimen 10 Hyponatremia: Likely due to multiple BM yesterday.              Sodium 138 on 3/10, continue to monitor 11. Hypokalemia:    Potassium 3.9 on 3/10 after supplementation  3/21- has  maintained K+ level at 3.8- con't to monitor 12. Proximal A FIb: Monitor HR tid--continue Cardizem 240 mg and amiodarone for rate control.              --monitor for symptoms with increase in activity.  3/16- HR 99 this AM- will monitor for RVR- esp with activity   3/17- HR back down to low 90s this AM- con't to monitor 13. OIC:              Discontinued colace to help bulk stools as now having loose stools.  3/20 moving bowels regularly now  3/21- was full of stool- got cleaned out-   3/22- having daily BMs- con't regimen 14. Chronic right heel ulcer: Continue to paint wound daily with iodine.   3/16- is Stage II- healing 15.  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 Will need bariatric equipment.  3/14- BMI down to 74.68  3/16- BMI down to 74. 12  3/17- BMI up to 75.82 this AM- still lower than admission, but not sure why would have changed 'so fast"- scale off? 16. Trach site  3/9- healing- but leave open to air.  3/11- almost completely healed- scab coming off- and healthy tissue   underneath.  3/15- resolved/healed 17. Foley for urinary retention  3/14-  Will keep Foley- got ok by head nurse, because he has such vast post op wounds from necrotizing fasciitis- con't for now  3/19- will con't foley since has such open wounds- to prevent additonal skin breakdown  3/21- will con't foley- due to open wounds and to prevent additional skin breakdown  3/23- pt wants to go home with foley- will need f/u with Urology to get out- since bladder, being a muscle, will be weak.  Will need bariatric BSC if at possible.  18. Muscle spasms  3/24- on robaxin 750 mg Q6 hours prn- suggest he actually take at least first 2 doses of day since it affects therapy if doesn't- con't regimen  19. Disposition:  at baseline, patient received significant assistance from his mother, aged 64. He required her assistance to wipe stool after BM due to body habitus. He was able to ambulate to bathroom and  back on his own. Plan is for discharge home to father, who is a IT sales professional, and who can provide more physical support to patient. Goal is ModA level for ambulation within his room. Plan for approx 2-3 week stay to reach this goal. He is currently demonstrating improved standing tolerance. Fuller Song discussed with father.   3/22- pt insists going home with mother- says she can do wound care better- will go to fathers when mother has surgery.    3/23- mother's surgery on 4/29     LOS: 16 days A FACE TO FACE EVALUATION WAS PERFORMED  Jevaughn Degollado 08/22/2020, 9:09 AM

## 2020-08-23 DIAGNOSIS — T148XXA Other injury of unspecified body region, initial encounter: Secondary | ICD-10-CM

## 2020-08-23 LAB — GLUCOSE, CAPILLARY
Glucose-Capillary: 115 mg/dL — ABNORMAL HIGH (ref 70–99)
Glucose-Capillary: 133 mg/dL — ABNORMAL HIGH (ref 70–99)
Glucose-Capillary: 130 mg/dL — ABNORMAL HIGH (ref 70–99)
Glucose-Capillary: 106 mg/dL — ABNORMAL HIGH (ref 70–99)

## 2020-08-23 NOTE — Progress Notes (Signed)
Physical Therapy Session Note  Patient Details  Name: Carl Hill MRN: [id number] Date of Birth: 1970/10/07  Today's Date: 08/23/2020 PT Individual Time: 6168-3729 PT Individual Time Calculation (min): 54 min   Short Term Goals: Week 3:  PT Short Term Goal 1 (Week 3): Pt will stand with RW x 2 minutes with min A x 2. PT Short Term Goal 2 (Week 3): Pt will tolerate >20 min of activity without incidence of pain. PT Short Term Goal 3 (Week 3): Pt will perform gait x 50 with RW and min A of 2  Skilled Therapeutic Interventions/Progress Updates: Pt presents supine in bed and agreeable to therapy.  B knee sleeves donned .  Pt transfers sup to sit w/ supervision afetr bed positioning completed.  Pt sat EOB w/ bariatric chair brought up close for support off of buttock wound.  Pt performed multiple sit to stand transfers at chair w/min A.  Pt stood x 1-2" w/ verbal cueing for upright posture.  Pt c/o pain in low back.  Pt states some lightheadeness and requires seated rest break.  O2 sats at 97% and HR of 108.  Pt performed seated LAQ and hip flexion to L LE only, 2/2 c/o pain w/ performance on right.  Pt does step to [facility] using bariatric chair for improved positioning .  Pt transfers sit to side-lying w/ supervision, bed features returned to correct position.  Pt performed partial squats w/ bed tilted  3 x 10.  Pt repositioned in bed to comfort and knee sleeves removed.  Pt states pain to R knee decreased to 8/10 w/ use of sleeve.  Pt very concerned w/ care for skin on shins when donning/doffing.  All needs in reach, mother present in room during therapy.     Therapy Documentation Precautions:  Precautions Precautions: Fall Precaution Comments: foley catheter, large buttocks wound, R heel wound Restrictions Weight Bearing Restrictions: No General:   Vital Signs:   Pain: 10/10 low back and right knee. Pain Assessment Pain Score: 5  Mobility:   Locomotion :    Trunk/Postural Assessment  :    Balance:   Exercises:   Other Treatments:      Therapy/Group: Individual Therapy  [name] 08/23/2020, 12:45 PM

## 2020-08-23 NOTE — Progress Notes (Signed)
Occupational Therapy Session Note  Patient Details  Name: Carl Hill MRN: [id number] Date of Birth: [birth year]  Today's Date: 08/23/2020 OT Individual Time: 1330-1400 OT Individual Time Calculation (min): 30 min  and Today's Date: 08/23/2020 OT Missed Time: 30 Minutes Missed Time Reason: Nursing care   Short Term Goals: Week 3:  OT Short Term Goal 1 (Week 3): Pt will don socks with max A using AE PRN OT Short Term Goal 2 (Week 3): Pt will perform stand pivot transfer to [facility] with max A+1 OT Short Term Goal 3 (Week 3): Pt will thread pants over BLE using AE PRN  Skilled Therapeutic Interventions/Progress Updates:    Pt missed 30 mins skilled OT services at beginning of scheduled session 2/2 nursing care for wound dressing change. OT intervention with focus on bed mobility, sit<>stand, standing balance, core and BUE strengthening and stretching, and activity tolerance to increase pt's independence with BADLs. All bed mobility with supervision. Sitting balance with supervsion. Standing balance with BUE support with supervision. Pt initially engaged in trunk rotations and flexion  to facilitate gentle stretching. Pt noted relief. Pt engaged in BUE rowing exercises with 3 kg ball 3x10, chest presses 3x10, and triceps extensions 1x10. Pt returned to bed and remained in bed with all needs within reach. Pt's mother present.   Therapy Documentation Precautions:  Precautions Precautions: Fall Precaution Comments: foley catheter, large buttocks wound, R heel wound Restrictions Weight Bearing Restrictions: No General: General OT Amount of Missed Time: 30 Minutes Pain:  Pt c/o Rt lower back pain relieved with stretching and repositioned in bed at end of session   Therapy/Group: Individual Therapy  [name] 08/23/2020, 2:58 PM

## 2020-08-23 NOTE — Progress Notes (Signed)
PROGRESS NOTE   Subjective/Complaints:  Pt reports that didn't really walk yesterday- focused on bowling- and standing without RW- which sounds great.  Sore this AM, but says was able to work out soreness before therapy yesterday- sure will be able to do today.  No knee sleeves yet- haven't been delivered.   Ate 100% breakfast, LBM in last 24 hours.  Feels "fine".   ROS:   Pt denies SOB, abd pain, CP, N/V/C/D, and vision changes   Objective:   No results found. No results for input(s): WBC, HGB, HCT, PLT in the last 72 hours. No results for input(s): NA, K, CL, CO2, GLUCOSE, BUN, CREATININE, CALCIUM in the last 72 hours.  Intake/Output Summary (Last 24 hours) at 08/23/2020 1254 Last data filed at 08/23/2020 0913 Gross per 24 hour  Intake 716 ml  Output 4350 ml  Net -3634 ml     Pressure Injury 08/06/20 Heel Right Stage 2 -  Partial thickness loss of dermis presenting as a shallow open injury with a red, pink wound bed without slough. (Active)  08/06/20 0240  Location: Heel  Location Orientation: Right  Staging: Stage 2 -  Partial thickness loss of dermis presenting as a shallow open injury with a red, pink wound bed without slough.  Wound Description (Comments):   Present on Admission: Yes    Physical Exam: Vital Signs Blood pressure 136/69, pulse 71, temperature 98.2 F (36.8 C), temperature source Oral, resp. rate 18, height 5\' 10"  (1.778 m), weight (!) 239.7 kg, SpO2 100 %.         General: awake, alert, appropriate, in bariatric low loss air bed,  Slightly slower this AM; NAD HENT: conjugate gaze; oropharynx moist CV: regular rate; no JVD Pulmonary: CTA B/L; no W/R/R- good air movement GI: soft, NT, ND, (+)BS Psychiatric: appropriate but sleepy Neurological: Ox3 Skin: Warm and dry. Has post op wound 26x17 cm on backside- NOT pressure ulcer Has a  A little dried blood scratch on R anterior shin-  looks well. -   Left 2nd toe- clean, really almost resolved. dressing removed R heel- dry and cracked skin- over dry- a spot that's a large thick scab- but better today- less cracked appearing with vaseline applied. Looks better this AM  R shin has 2 scrapes on it Musc: No edema in extremities.  No tenderness in extremities. Musculoskeletal: Bilateral upper extremities: 5/5 throughout Bilateral lower extremities: Hip flexion grossly 4+/5, distally 5/5   Assessment/Plan: 1. Functional deficits which require 3+ hours per day of interdisciplinary therapy in a comprehensive inpatient rehab setting.  Physiatrist is providing close team supervision and 24 hour management of active medical problems listed below.  Physiatrist and rehab team continue to assess barriers to discharge/monitor patient progress toward functional and medical goals  Care Tool:  Bathing    Body parts bathed by patient: Right arm,Left arm,Chest,Abdomen,Face,Right upper leg,Left upper leg   Body parts bathed by helper: Front perineal area,Buttocks,Right lower leg,Left lower leg     Bathing assist Assist Level: Maximal Assistance - Patient 24 - 49%     Upper Body Dressing/Undressing Upper body dressing   What is the patient wearing?: Pull over shirt  Upper body assist Assist Level: Supervision/Verbal cueing    Lower Body Dressing/Undressing Lower body dressing      What is the patient wearing?: Incontinence brief     Lower body assist Assist for lower body dressing: Maximal Assistance - Patient 25 - 49%     Toileting Toileting    Toileting assist Assist for toileting: Dependent - Patient 0%     Transfers Chair/bed transfer  Transfers assist  Chair/bed transfer activity did not occur: Safety/medical concerns  Chair/bed transfer assist level: 2 Helpers     Locomotion Ambulation   Ambulation assist   Ambulation activity did not occur: Safety/medical concerns  Assist level: 2  helpers Assistive device: Walker-rolling (+3 with RW and w/c follow) Max distance: 20   Walk 10 feet activity   Assist  Walk 10 feet activity did not occur: Safety/medical concerns  Assist level: 2 helpers Assistive device: Walker-rolling   Walk 50 feet activity   Assist Walk 50 feet with 2 turns activity did not occur: Safety/medical concerns         Walk 150 feet activity   Assist Walk 150 feet activity did not occur: Safety/medical concerns         Walk 10 feet on uneven surface  activity   Assist Walk 10 feet on uneven surfaces activity did not occur: Safety/medical concerns         Wheelchair     Assist Will patient use wheelchair at discharge?: Yes Type of Wheelchair: Manual Wheelchair activity did not occur: Safety/medical concerns         Wheelchair 50 feet with 2 turns activity    Assist    Wheelchair 50 feet with 2 turns activity did not occur: Safety/medical concerns       Wheelchair 150 feet activity     Assist  Wheelchair 150 feet activity did not occur: Safety/medical concerns       Blood pressure 136/69, pulse 71, temperature 98.2 F (36.8 C), temperature source Oral, resp. rate 18, height 5\' 10"  (1.778 m), weight (!) 239.7 kg, SpO2 100 %.  Medical Problem List and Plan: 1.  Debility secondary to septic shock from necrotizing soft tissue infection of perianal, gluteal, and perineal tissues.  3/16- will try ACE wrap to stabilize R knee buckling-  3/17- ACE wrap slipped down- con't PT and OT for working esp on walking/transfers and ADLs    3/24- walked yesterday x 20 ft with 2- for first time- con't PT and OT  3/25- standing without RW yesterday- making progress daily- con't PT and OT 2.  Antithrombotics: -DVT/anticoagulation:  Pharmaceutical:   Xarelto  3/15- pt doesn't want Coumadin and wants to continue on Xarelto- after prolonged d/w pt.              -antiplatelet therapy: N/A 3. Pain Management:  Continue  Lyrica and elavil for neuropathy.              Oxycontin 15 mg bid with 10 mg prn for breakthrough pain.   On robaxin 750 mg q6 hours prn  3/23- doesn't want to reduce Oxycodone dose  3/25- sore, but overall better- con't regimen 4. Mood: LCSW to follow for evaluation and support.              -antipsychotic agents: N/A 5. Neuropsych: This patient is capable of making decisions on his own behalf. 6. Skin/Wound Care: Air mattress for pressure relief measures.              --  will need local care frequently to keep sacral wound bed clean.             --Continue foley for now.  3/10- con't sacral wound care daily and prn  Con't low air loss mattress and WOC note in chart- isn't pressure- is post op wound  3/19 added hemorrhoidal cream to assist with any pain which might be ano-rectal/hemorrhoidal 7. Fluids/Electrolytes/Nutrition: Monitor I/Os.  8. Necrotizing fascitis: Has been on Augmentin since 02/11--> to continue for 6 months per ID 9. T2DM with neuropathy: Hgb A1c- 9.6 (down from 13 in Aug) Will continue to monitor BS ac/hs and use SSI for elevated BS             --used Farxiga, metformin and Victoza 1.8.             --Continue Levemir bid and titrate as indicated  -decreased novolog to 10 units TID with meals  3/25- BGs well controlled- con't regimen- will not go back to previous noninsulin regimen after d/w pt.  10 Hyponatremia: Likely due to multiple BM yesterday.              Sodium 138 on 3/10, continue to monitor 11. Hypokalemia:    Potassium 3.9 on 3/10 after supplementation  3/21- has maintained K+ level at 3.8- con't to monitor 12. Proximal A FIb: Monitor HR tid--continue Cardizem 240 mg and amiodarone for rate control.              --monitor for symptoms with increase in activity.  3/16- HR 99 this AM- will monitor for RVR- esp with activity   3/17- HR back down to low 90s this AM- con't to monitor 13. OIC:              Discontinued colace to help bulk stools as now having  loose stools.  3/20 moving bowels regularly now  3/21- was full of stool- got cleaned out-   3/25- having daily BMs- con't regimen 14. Chronic right heel ulcer: Continue to paint wound daily with iodine.   3/16- is Stage II- healing 15.  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 Will need bariatric equipment.  3/17- BMI up to 75.82 this AM- still lower than admission, but not sure why would have changed 'so fast"- scale off?  3/25- hasn't checked weight lately- will see if nursing can get weight.  16. Trach site  3/15- resolved/healed 17. Foley for urinary retention  3/14-  Will keep Foley- got ok by head nurse, because he has such vast post op wounds from necrotizing fasciitis- con't for now  3/23- pt wants to go home with foley- will need f/u with Urology to get out- since bladder, being a muscle, will be weak. Will need bariatric BSC if at possible.  18. Muscle spasms  3/24- on robaxin 750 mg Q6 hours prn- suggest he actually take at least first 2 doses of day since it affects therapy if doesn't- con't regimen  19. Disposition:  at baseline, patient received significant assistance from his mother, aged 40. He required her assistance to wipe stool after BM due to body habitus. He was able to ambulate to bathroom and back on his own. Plan is for discharge home to father, who is a IT sales professional, and who can provide more physical support to patient. Goal is ModA level for ambulation within his room. Plan for approx 2-3 week stay to reach this goal. He is currently demonstrating improved standing tolerance. Fuller Song discussed with father.  3/22- pt insists going home with mother- says she can do wound care better- will go to fathers when mother has surgery.    3/23- mother's surgery on 4/29     LOS: 17 days A FACE TO FACE EVALUATION WAS PERFORMED  Azalyn Sliwa 08/23/2020, 12:54 PM

## 2020-08-23 NOTE — Plan of Care (Signed)
  Problem: RH Balance Goal: LTG Patient will maintain dynamic standing with ADLs (OT) Description: LTG:  Patient will maintain dynamic standing balance with assist during activities of daily living (OT)  Flowsheets (Taken 08/23/2020 1607) LTG: Pt will maintain dynamic standing balance during ADLs with: (downgraded JLS) Minimal Assistance - Patient > 75% Note: Downgraded JLS   Problem: Sit to Stand Goal: LTG:  Patient will perform sit to stand in prep for activites of daily living with assistance level (OT) Description: LTG:  Patient will perform sit to stand in prep for activites of daily living with assistance level (OT) Flowsheets (Taken 08/23/2020 1607) LTG: PT will perform sit to stand in prep for activites of daily living with assistance level: (downgraded JLS) Minimal Assistance - Patient > 75% Note: Downgraded JLS

## 2020-08-23 NOTE — Progress Notes (Signed)
Patient ID: Carl Hill, male   DOB: 1970/08/07, 50 y.o.   MRN: [id number]  SW received updates from pt and pt and mother to discuss concerns related to no one being able to care for patient during her week of surgery (4/29). SW discussed options such as Engineer, materials aid or considering short term rehab placement now if needed as unable to guarantee he will still be in SNF by the time of her surgery. Pt states that he hopes to be walking, and would like to work on a HHA. Preferred agencies are: [facility] and [facility]. SW explained [facility] does not provide skilled nursing care so this would not be an option. SW will explore [facility] as an option. Both understand that if agency is unable to accept, will work on finding an agency that will accept.   SW sent referral to [place] for HHPT/OT/SN (wound care- sacrum)/aide (if available and will apply for PCS). Referral declined.   SW sent referral to [place] ([facility] SW waiting on follow-up.  *referral declined by branch.  SW sent referral to [facility] and waiting on follow-up.   Cecile Sheerer, MSW, LCSWA Office: [phone] Cell: [phone] Fax: [phone]

## 2020-08-23 NOTE — Progress Notes (Signed)
Occupational Therapy Session Note  Patient Details  Name: Carl Hill MRN: [id number] Date of Birth: Oct 26, 1970  Today's Date: 08/23/2020 OT Individual Time: 8416-6063 OT Individual Time Calculation (min): 54 min    Short Term Goals: Week 3:  OT Short Term Goal 1 (Week 3): Pt will don socks with max A using AE PRN OT Short Term Goal 2 (Week 3): Pt will perform stand pivot transfer to [facility] with max A+1 OT Short Term Goal 3 (Week 3): Pt will thread pants over BLE using AE PRN  Skilled Therapeutic Interventions/Progress Updates:    Pt with increased pain in Rt knee and quads, in addition to Rt lower back following previous day's PT session involving bowling and amb with RW. Pt engaged in gentle stretching and myofascial release to Rt lower back to alleviate pain and facilitate pt's ability to participate in therapy. Pt completed UB bathing/dressing tasks seated EOB with bari recliner postioned to provide support when standing and for knees when seated. Sit<>stand x5 with supervision using BUE for support. Pt returned to supine. All needs within reach.   Therapy Documentation Precautions:  Precautions Precautions: Fall Precaution Comments: foley catheter, large buttocks wound, R heel wound Restrictions Weight Bearing Restrictions: No   Pain: Pain Assessment Pain Scale: 0-10 Pain Score: 9  Pain Type: Surgical pain Pain Location: Buttocks Pain Orientation: Lower Pain Radiating Towards: back Pain Descriptors / Indicators: Aching;Sore Pain Frequency: Constant Pain Onset: On-going Pain Intervention(s): premedicated, stretching, and repositioning      Therapy/Group: Individual Therapy  [name] 08/23/2020, 9:58 AM

## 2020-08-23 NOTE — Plan of Care (Signed)
  Problem: RH Balance Goal: LTG Patient will maintain dynamic standing with ADLs (OT) Description: LTG:  Patient will maintain dynamic standing balance with assist during activities of daily living (OT)  Flowsheets (Taken 08/23/2020 1607) LTG: Pt will maintain dynamic standing balance during ADLs with: (downgraded JLS) Minimal Assistance - Patient > 75% Note: Downgraded JLS   Problem: Sit to Stand Goal: LTG:  Patient will perform sit to stand in prep for activites of daily living with assistance level (OT) Description: LTG:  Patient will perform sit to stand in prep for activites of daily living with assistance level (OT) Flowsheets (Taken 08/23/2020 1607) LTG: PT will perform sit to stand in prep for activites of daily living with assistance level: (downgraded JLS) Minimal Assistance - Patient > 75% Note: Downgraded JLS   Problem: RH Toileting Goal: LTG Patient will perform toileting task (3/3 steps) with assistance level (OT) Description: LTG: Patient will perform toileting task (3/3 steps) with assistance level (OT)  Flowsheets (Taken 08/23/2020 1607) LTG: Pt will perform toileting task (3/3 steps) with assistance level: (downgrade due to wound) Moderate Assistance - Patient 50 - 74% Note: Downgrade due to wound

## 2020-08-24 DIAGNOSIS — Z8719 Personal history of other diseases of the digestive system: Secondary | ICD-10-CM

## 2020-08-24 DIAGNOSIS — M62838 Other muscle spasm: Secondary | ICD-10-CM

## 2020-08-24 LAB — GLUCOSE, CAPILLARY
Glucose-Capillary: 134 mg/dL — ABNORMAL HIGH (ref 70–99)
Glucose-Capillary: 115 mg/dL — ABNORMAL HIGH (ref 70–99)
Glucose-Capillary: 149 mg/dL — ABNORMAL HIGH (ref 70–99)
Glucose-Capillary: 118 mg/dL — ABNORMAL HIGH (ref 70–99)

## 2020-08-24 NOTE — Progress Notes (Signed)
PROGRESS NOTE   Subjective/Complaints: Patient seen laying in bed this morning.  He states he slept well overnight.  He denies complaints.  He has questions regarding seizure pads to fix his bed.  ROS: Denies CP, SOB, N/V/D  Objective:   No results found. No results for input(s): WBC, HGB, HCT, PLT in the last 72 hours. No results for input(s): NA, K, CL, CO2, GLUCOSE, BUN, CREATININE, CALCIUM in the last 72 hours.  Intake/Output Summary (Last 24 hours) at 08/24/2020 1035 Last data filed at 08/24/2020 0900 Gross per 24 hour  Intake 476 ml  Output 3550 ml  Net -3074 ml     Pressure Injury 08/06/20 Heel Right Stage 2 -  Partial thickness loss of dermis presenting as a shallow open injury with a red, pink wound bed without slough. (Active)  08/06/20 0240  Location: Heel  Location Orientation: Right  Staging: Stage 2 -  Partial thickness loss of dermis presenting as a shallow open injury with a red, pink wound bed without slough.  Wound Description (Comments):   Present on Admission: Yes    Physical Exam: Vital Signs Blood pressure 136/69, pulse 94, temperature 97.8 F (36.6 C), resp. rate 14, height 5\' 10"  (1.778 m), weight (!) 231.6 kg, SpO2 97 %. Constitutional: No distress . Vital signs reviewed. HENT: Normocephalic.  Atraumatic. Eyes: EOMI. No discharge. Cardiovascular: No JVD.  RRR. Respiratory: Normal effort.  No stridor.  Bilateral clear to auscultation. GI: Non-distended.  BS +. Skin: Warm and dry.  Postoperative wound not examined today. Vascular changes bilateral lower extremities Psych: Normal mood.  Normal behavior. Musc: No edema in extremities.  No tenderness in extremities. Neuro: Alert Motor: Bilateral upper extremities: 5/5 throughout, unchanged Bilateral lower extremities: Hip flexion grossly 4+/5, distally 5/5, unchanged  Assessment/Plan: 1. Functional deficits which require 3+ hours per day of  interdisciplinary therapy in a comprehensive inpatient rehab setting.  Physiatrist is providing close team supervision and 24 hour management of active medical problems listed below.  Physiatrist and rehab team continue to assess barriers to discharge/monitor patient progress toward functional and medical goals  Care Tool:  Bathing    Body parts bathed by patient: Right arm,Left arm,Chest,Abdomen,Face,Right upper leg,Left upper leg   Body parts bathed by helper: Front perineal area,Buttocks,Right lower leg,Left lower leg     Bathing assist Assist Level: Maximal Assistance - Patient 24 - 49%     Upper Body Dressing/Undressing Upper body dressing   What is the patient wearing?: Pull over shirt    Upper body assist Assist Level: Supervision/Verbal cueing    Lower Body Dressing/Undressing Lower body dressing      What is the patient wearing?: Incontinence brief     Lower body assist Assist for lower body dressing: Maximal Assistance - Patient 25 - 49%     Toileting Toileting    Toileting assist Assist for toileting: Dependent - Patient 0%     Transfers Chair/bed transfer  Transfers assist  Chair/bed transfer activity did not occur: Safety/medical concerns  Chair/bed transfer assist level: 2 Helpers     Locomotion Ambulation   Ambulation assist   Ambulation activity did not occur: Safety/medical concerns  Assist level:  2 helpers Assistive device: Walker-rolling (+3 with RW and w/c follow) Max distance: 20   Walk 10 feet activity   Assist  Walk 10 feet activity did not occur: Safety/medical concerns  Assist level: 2 helpers Assistive device: Walker-rolling   Walk 50 feet activity   Assist Walk 50 feet with 2 turns activity did not occur: Safety/medical concerns         Walk 150 feet activity   Assist Walk 150 feet activity did not occur: Safety/medical concerns         Walk 10 feet on uneven surface  activity   Assist Walk 10 feet  on uneven surfaces activity did not occur: Safety/medical concerns         Wheelchair     Assist Will patient use wheelchair at discharge?: Yes Type of Wheelchair: Manual Wheelchair activity did not occur: Safety/medical concerns         Wheelchair 50 feet with 2 turns activity    Assist    Wheelchair 50 feet with 2 turns activity did not occur: Safety/medical concerns       Wheelchair 150 feet activity     Assist  Wheelchair 150 feet activity did not occur: Safety/medical concerns       Blood pressure 136/69, pulse 94, temperature 97.8 F (36.6 C), resp. rate 14, height 5\' 10"  (1.778 m), weight (!) 231.6 kg, SpO2 97 %.  Medical Problem List and Plan: 1.  Debility secondary to septic shock from necrotizing soft tissue infection of perianal, gluteal, and perineal tissues.  Continue CIR  2.  Antithrombotics: -DVT/anticoagulation:  Pharmaceutical:   Xarelto             -antiplatelet therapy: N/A 3. Pain Management:  Continue Lyrica and elavil for neuropathy.              Oxycontin 15 mg bid with 10 mg prn for breakthrough pain.   On robaxin 750 mg q6 hours prn  Controlled with meds on 3/26 4. Mood: LCSW to follow for evaluation and support.              -antipsychotic agents: N/A 5. Neuropsych: This patient is capable of making decisions on his own behalf. 6. Skin/Wound Care: Air mattress for pressure relief measures.              --will need local care frequently to keep sacral wound bed clean.             --Continue foley for now.  Added hemorrhoidal cream to assist with any pain which might be ano-rectal/hemorrhoidal 7. Fluids/Electrolytes/Nutrition: Monitor I/Os.  8. Necrotizing fascitis: Has been on Augmentin since 02/11--> to continue for 6 months per ID 9. T2DM with neuropathy: Hgb A1c- 9.6 (down from 13 in Aug) Will continue to monitor BS ac/hs and use SSI for elevated BS             --used Farxiga, metformin and Victoza 1.8.              --Continue Levemir bid and titrate as indicated  -decreased novolog to 10 units TID with meals   Slightly elevated on 3/26 10 Hyponatremia:              Sodium 138 on 3/21, labs ordered for Monday 11. Hypokalemia:    Potassium 3.8 on 3/21 12. Proximal A FIb: Monitor HR tid--continue Cardizem 240 mg and amiodarone for rate control.              --monitor  for symptoms with increase in activity.  Controlled on 3/26 13. OIC:              Discontinued colace   Improved  14. Chronic right heel ulcer: Continue to paint wound daily with iodine.   3/16- is Stage II- healing 15.  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 Will need bariatric equipment. 16. Trach site  3/15- resolved/healed 17. Foley for urinary retention  3/23- pt wants to go home with foley- will need f/u with Urology to get out- since bladder, being a muscle, will be weak. Will need bariatric BSC if at possible.  18. Muscle spasms  See #3  19. Disposition:   At baseline, patient received significant assistance from his mother, aged 89. He required her assistance to wipe stool after BM due to body habitus. He was able to ambulate to bathroom and back on his own. Plan is for discharge home to father, who is a IT sales professional, and who can provide more physical support to patient. Goal is ModA level for ambulation within his room.   3/22- pt insists going home with mother- says she can do wound care better- will go to fathers when mother has surgery.    3/23- mother's surgery on 4/29  LOS: 18 days A FACE TO FACE EVALUATION WAS PERFORMED  Carl Hill 08/24/2020, 10:35 AM

## 2020-08-24 NOTE — Progress Notes (Signed)
Slept well throughout the night. Wound care completed, with pre medication of dilaudid. No problems noted.

## 2020-08-25 LAB — GLUCOSE, CAPILLARY
Glucose-Capillary: 115 mg/dL — ABNORMAL HIGH (ref 70–99)
Glucose-Capillary: 134 mg/dL — ABNORMAL HIGH (ref 70–99)
Glucose-Capillary: 109 mg/dL — ABNORMAL HIGH (ref 70–99)
Glucose-Capillary: 133 mg/dL — ABNORMAL HIGH (ref 70–99)

## 2020-08-25 NOTE — Progress Notes (Signed)
Physical Therapy Session Note  Patient Details  Name: Carl Hill MRN: [id number] Date of Birth: [birth year]  Today's Date: 08/25/2020 PT Individual Time: 0900-1023 PT Individual Time Calculation (min): 83 min   Short Term Goals: Week 3:  PT Short Term Goal 1 (Week 3): Pt will stand with RW x 2 minutes with min A x 2. PT Short Term Goal 2 (Week 3): Pt will tolerate >20 min of activity without incidence of pain. PT Short Term Goal 3 (Week 3): Pt will perform gait x 50 with RW and min A of 2  Skilled Therapeutic Interventions/Progress Updates:    Pt received supine in bed, agreeable to PT session. Pt reports pain in R knee at rest, R knee pain increases with mobility as well as onset of R lower back pain with mobility. Provided rest breaks and repositioning as needed for pain management. Assisted pt with donning B knee sleeves, socks, shoes, and shorts. Supine to sit with Supervision with heavy reliance on bed features. Sit to stand with min A +2 to RW, dependent to pull shorts up over hips in standing. Stand pivot transfer bed to w/c with RW and min A +2 for balance and stability. Manual w/c propulsion x 50 ft with use of BUE at Supervision level for global endurance. Kinetron level 30 cm/sec 3 x 3 min with 1 min seated rest break between bouts for reciprocal movement of BLE and global LE strengthening. Sit to stand with min to mod A from w/c to RW with assist from a 2nd person blocking knees and a 3rd person stabilizing RW. Ambulation x 100 ft with RW and +3 (min A x 1 balance, CGA x 1 blocking knees, close w/c follow for safety) with 4 seated rest breaks. Longest distance of ambulation prior to taking a seated rest break is 25 ft this session. Stand pivot transfer back to bed with RW and min A +2. Standing mini-squats x 3, x 5 reps with RW and +2 for balance and safety. Pt reports onset of sharp "grinding" pain in R knee with squats, deferred further repetitions. Standing alt L/R 4" step-taps  with RW and +2 for safety, x 5 reps each. Discussed pt's home setup and still recommending ramp at this time for safety. Pt expresses concerns regarding cost of ramp installation as well as time needed to construct one prior to d/c home. Pt returned to supine at Supervision level. Pt left semi-reclined in bed with needs in reach at end of session.  Therapy Documentation Precautions:  Precautions Precautions: Fall Precaution Comments: foley catheter, large buttocks wound, R heel wound Restrictions Weight Bearing Restrictions: No   Therapy/Group: Individual Therapy   Peter Congo, PT, DPT, CSRS  08/25/2020, 12:32 PM

## 2020-08-25 NOTE — Progress Notes (Signed)
Wound care completed, with pre medication of dilaudid. Pt pleasant and cooperative. Slept fairly well throughout the night. Complaints of pain this am prior to prn dilaudid given, min effects noted.

## 2020-08-26 LAB — CBC WITH DIFFERENTIAL/PLATELET
Abs Immature Granulocytes: 0.04 10*3/uL (ref 0.00–0.07)
Basophils Absolute: 0.1 10*3/uL (ref 0.0–0.1)
Basophils Relative: 1 %
Eosinophils Absolute: 0.3 10*3/uL (ref 0.0–0.5)
Eosinophils Relative: 3 %
HCT: 38.8 % — ABNORMAL LOW (ref 39.0–52.0)
Hemoglobin: 11.8 g/dL — ABNORMAL LOW (ref 13.0–17.0)
Immature Granulocytes: 0 %
Lymphocytes Relative: 42 %
Lymphs Abs: 3.8 10*3/uL (ref 0.7–4.0)
MCH: 25.9 pg — ABNORMAL LOW (ref 26.0–34.0)
MCHC: 30.4 g/dL (ref 30.0–36.0)
MCV: 85.1 fL (ref 80.0–100.0)
Monocytes Absolute: 0.5 10*3/uL (ref 0.1–1.0)
Monocytes Relative: 6 %
Neutro Abs: 4.3 10*3/uL (ref 1.7–7.7)
Neutrophils Relative %: 48 %
Platelets: 375 10*3/uL (ref 150–400)
RBC: 4.56 MIL/uL (ref 4.22–5.81)
RDW: 16.8 % — ABNORMAL HIGH (ref 11.5–15.5)
WBC: 9.1 10*3/uL (ref 4.0–10.5)
nRBC: 0 % (ref 0.0–0.2)

## 2020-08-26 LAB — BASIC METABOLIC PANEL
Anion gap: 10 (ref 5–15)
BUN: 6 mg/dL (ref 6–20)
CO2: 26 mmol/L (ref 22–32)
Calcium: 9.2 mg/dL (ref 8.9–10.3)
Chloride: 101 mmol/L (ref 98–111)
Creatinine, Ser: 0.96 mg/dL (ref 0.61–1.24)
GFR, Estimated: >60 mL/min (ref >60)
Glucose, Bld: 159 mg/dL — ABNORMAL HIGH (ref 70–99)
Potassium: 3.9 mmol/L (ref 3.5–5.1)
Sodium: 137 mmol/L (ref 135–145)

## 2020-08-26 LAB — GLUCOSE, CAPILLARY
Glucose-Capillary: 139 mg/dL — ABNORMAL HIGH (ref 70–99)
Glucose-Capillary: 130 mg/dL — ABNORMAL HIGH (ref 70–99)
Glucose-Capillary: 107 mg/dL — ABNORMAL HIGH (ref 70–99)
Glucose-Capillary: 117 mg/dL — ABNORMAL HIGH (ref 70–99)
Glucose-Capillary: 112 mg/dL — ABNORMAL HIGH (ref 70–99)

## 2020-08-26 NOTE — Progress Notes (Signed)
Physical Therapy Session Note  Patient Details  Name: Carl Hill MRN: [id number] Date of Birth: 1971/05/24  Today's Date: 08/26/2020 PT Individual Time: 1000-1100; 1345-1435 PT Individual Time Calculation (min): 60 min and 50 min  Short Term Goals: Week 3:  PT Short Term Goal 1 (Week 3): Pt will stand with RW x 2 minutes with min A x 2. PT Short Term Goal 2 (Week 3): Pt will tolerate >20 min of activity without incidence of pain. PT Short Term Goal 3 (Week 3): Pt will perform gait x 50 with RW and min A of 2  Skilled Therapeutic Interventions/Progress Updates:    Session 1: Pt received supine in bed, agreeable to PT session. Pt reports pain in his buttocks at rest, utilized repositioning during session for pain management. Bed mobility Supervision from flat bed with use of bedrail. Pt requesting to perform "dry run" of shower transfer prior to showering with OT tomorrow. Pt is min A +2 to stand from bed to RW. Ambulation into bathroom with bari RW and min A +2 for safety, w/c follow until bathroom door due to w/c not fitting through doorway. Pt able to perform stand pivot transfer to sit on bari BSC in shower. Pt able to ambulate from bathroom back into hospital room with bari RW and min A +2 for safety. Pt exhibits good ability to navigate slight incline in/out of bathroom with RW. Manual w/c propulsion x 100 ft with use of BLE at Supervision level for global strengthening. Stand pivot transfer w/c to/from mat table with RW and min A +2 for safety. Standing alt L/R 4" step-taps, 3 x 10 reps, with RW and min A +2 for balance. Once instance of R knee buckling, pt able to stop and sit safely. Pt returned to bed at end of session, Supervision for bed mobility. Pt left in sidelying in bed with needs in reach, mother present.  Session 2: Pt received supine in bed, agreeable to PT session. Pt reports intermittent pain in buttocks and low back throughout session, utilized rest breaks and  repositioning for pain management. Bed mobility CGA from flat bed with use of bedrail. Sit to stand with min A +2 from bed. Stand pivot transfer to w/c with RW and min A +2. Sit to stand from w/c to RW with mod to max A to stand and B knees blocked by +2 assist. Ambulation x 34', x 18', x 24', x 45' (x121' total) with RW and min A x 1 for balance and min A x 1 blocking knees (R>L) during gait. Pt continues to exhibit improved tolerance for gait training this date with increased distance he is able to ambulate prior to requiring a rest break. Pt does continue to exhibit narrow BOS during gait. Pt returned to bed at end of session, Supervision for bed mobility. Pt left supine in bed with needs in reach at end of session.  Therapy Documentation Precautions:  Precautions Precautions: Fall Precaution Comments: foley catheter, large buttocks wound, R heel wound Restrictions Weight Bearing Restrictions: No   Therapy/Group: Individual Therapy   Peter Congo, PT, DPT, CSRS  08/26/2020, 3:47 PM

## 2020-08-26 NOTE — Progress Notes (Signed)
PROGRESS NOTE   Subjective/Complaints:  Pt reports walked 100 ft this weekend- total.  K tape helping R low back pain.  And Tom's "magic hands".   Got knee sleeves- slip down.  Hasn't got shower yet. .  ROS:  Pt denies SOB, abd pain, CP, N/V/C/D, and vision changes   Objective:   No results found. Recent Labs    08/26/20 0834  WBC 9.1  HGB 11.8*  HCT 38.8*  PLT 375   Recent Labs    08/26/20 0834  NA 137  K 3.9  CL 101  CO2 26  GLUCOSE 159*  BUN 6  CREATININE 0.96  CALCIUM 9.2    Intake/Output Summary (Last 24 hours) at 08/26/2020 2000 Last data filed at 08/26/2020 1647 Gross per 24 hour  Intake 60 ml  Output 1750 ml  Net -1690 ml     Pressure Injury 08/06/20 Heel Right Stage 2 -  Partial thickness loss of dermis presenting as a shallow open injury with a red, pink wound bed without slough. (Active)  08/06/20 0240  Location: Heel  Location Orientation: Right  Staging: Stage 2 -  Partial thickness loss of dermis presenting as a shallow open injury with a red, pink wound bed without slough.  Wound Description (Comments):   Present on Admission: Yes    Physical Exam: Vital Signs Blood pressure 137/76, pulse (!) 102, temperature 98.4 F (36.9 C), temperature source Oral, resp. rate 16, height 5\' 10"  (1.778 m), weight (!) 228.5 kg, SpO2 98 %.    General: awake, alert, appropriate, sitting EOB, bathing, OT at bedside, NAD HENT: conjugate gaze; oropharynx moist CV: regular rate; no JVD Pulmonary: CTA B/L; no W/R/R- good air movement GI: soft, NT, ND, (+)BS- protuberant- BMI 72 Psychiatric: appropriate- interactive Neurological: Ox3  Skin: Warm and dry.  Postoperative wound not examined today.- can see the edges- is packed- dressing needs changing.  Vascular changes bilateral lower extremities Psych: Normal mood.  Normal behavior. Musc: No edema in extremities.  No tenderness in  extremities. Neuro: Alert Motor: Bilateral upper extremities: 5/5 throughout, unchanged Bilateral lower extremities: Hip flexion grossly 4+/5, distally 5/5, unchanged  Assessment/Plan: 1. Functional deficits which require 3+ hours per day of interdisciplinary therapy in a comprehensive inpatient rehab setting.  Physiatrist is providing close team supervision and 24 hour management of active medical problems listed below.  Physiatrist and rehab team continue to assess barriers to discharge/monitor patient progress toward functional and medical goals  Care Tool:  Bathing    Body parts bathed by patient: Right arm,Left arm,Chest,Abdomen,Face,Right upper leg,Left upper leg   Body parts bathed by helper: Front perineal area,Buttocks,Right lower leg,Left lower leg     Bathing assist Assist Level: Maximal Assistance - Patient 24 - 49%     Upper Body Dressing/Undressing Upper body dressing   What is the patient wearing?: Pull over shirt    Upper body assist Assist Level: Supervision/Verbal cueing    Lower Body Dressing/Undressing Lower body dressing      What is the patient wearing?: Incontinence brief     Lower body assist Assist for lower body dressing: Maximal Assistance - Patient 25 - 49%  Toileting Toileting    Toileting assist Assist for toileting: Dependent - Patient 0%     Transfers Chair/bed transfer  Transfers assist  Chair/bed transfer activity did not occur: Safety/medical concerns  Chair/bed transfer assist level: 2 Helpers     Locomotion Ambulation   Ambulation assist   Ambulation activity did not occur: Safety/medical concerns  Assist level: 2 helpers Assistive device: Walker-rolling Max distance: 45'   Walk 10 feet activity   Assist  Walk 10 feet activity did not occur: Safety/medical concerns  Assist level: 2 helpers Assistive device: Walker-rolling   Walk 50 feet activity   Assist Walk 50 feet with 2 turns activity did not  occur: Safety/medical concerns         Walk 150 feet activity   Assist Walk 150 feet activity did not occur: Safety/medical concerns         Walk 10 feet on uneven surface  activity   Assist Walk 10 feet on uneven surfaces activity did not occur: Safety/medical concerns         Wheelchair     Assist Will patient use wheelchair at discharge?: Yes Type of Wheelchair: Manual Wheelchair activity did not occur: Safety/medical concerns  Wheelchair assist level: Supervision/Verbal cueing Max wheelchair distance: 55'    Wheelchair 50 feet with 2 turns activity    Assist    Wheelchair 50 feet with 2 turns activity did not occur: Safety/medical concerns   Assist Level: Supervision/Verbal cueing   Wheelchair 150 feet activity     Assist  Wheelchair 150 feet activity did not occur: Safety/medical concerns       Blood pressure 137/76, pulse (!) 102, temperature 98.4 F (36.9 C), temperature source Oral, resp. rate 16, height 5\' 10"  (1.778 m), weight (!) 228.5 kg, SpO2 98 %.  Medical Problem List and Plan: 1.  Debility secondary to septic shock from necrotizing soft tissue infection of perianal, gluteal, and perineal tissues.  3/28- con't PT and PT- progressing- walked 100 ft this weekend, total- see if pt can get into shower?- Will change dressing afterwards 2.  Antithrombotics: -DVT/anticoagulation:  Pharmaceutical:   Xarelto             -antiplatelet therapy: N/A 3. Pain Management:  Continue Lyrica and elavil for neuropathy.              Oxycontin 15 mg bid with 10 mg prn for breakthrough pain.   On robaxin 750 mg q6 hours prn  Controlled with meds on 3/26  3/28- pain controlled- con't meds 4. Mood: LCSW to follow for evaluation and support.              -antipsychotic agents: N/A 5. Neuropsych: This patient is capable of making decisions on his own behalf. 6. Skin/Wound Care: Air mattress for pressure relief measures.              --will need  local care frequently to keep sacral wound bed clean.             --Continue foley for now.  Added hemorrhoidal cream to assist with any pain which might be ano-rectal/hemorrhoidal 7. Fluids/Electrolytes/Nutrition: Monitor I/Os.  8. Necrotizing fascitis: Has been on Augmentin since 02/11--> to continue for 6 months per ID 9. T2DM with neuropathy: Hgb A1c- 9.6 (down from 13 in Aug) Will continue to monitor BS ac/hs and use SSI for elevated BS             --used Farxiga, metformin and Victoza 1.8.             --  Continue Levemir bid and titrate as indicated  -decreased novolog to 10 units TID with meals   Slightly elevated on 3/26  3/28- A1c 5.9- BGs- 107 to 133- con't regimen 10 Hyponatremia:              Sodium 138 on 3/21, labs ordered for Monday 11. Hypokalemia:    Potassium 3.8 on 3/21 12. Proximal A FIb: Monitor HR tid--continue Cardizem 240 mg and amiodarone for rate control.              --monitor for symptoms with increase in activity.  Controlled on 3/26 13. OIC:              Discontinued colace   Improved  14. Chronic right heel ulcer: Continue to paint wound daily with iodine.   3/16- is Stage II- healing 15.  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 Will need bariatric equipment.  3/28- BMI down to 72 16. Trach site  3/15- resolved/healed 17. Foley for urinary retention  3/23- pt wants to go home with foley- will need f/u with Urology to get out- since bladder, being a muscle, will be weak. Will need bariatric BSC if at possible.  18. Muscle spasms  See #3  19. Disposition:   At baseline, patient received significant assistance from his mother, aged 21. He required her assistance to wipe stool after BM due to body habitus. He was able to ambulate to bathroom and back on his own. Plan is for discharge home to father, who is a IT sales professional, and who can provide more physical support to patient. Goal is ModA level for ambulation within his room.   3/22- pt insists  going home with mother- says she can do wound care better- will go to fathers when mother has surgery.    3/23- mother's surgery on 4/29  LOS: 20 days A FACE TO FACE EVALUATION WAS PERFORMED  Carl Hill 08/26/2020, 8:00 PM

## 2020-08-26 NOTE — Progress Notes (Signed)
Occupational Therapy Session Note  Patient Details  Name: Carl Hill MRN: [id number] Date of Birth: 11-19-1970  Today's Date: 08/26/2020 OT Individual Time: 8250-5397 OT Individual Time Calculation (min): 55 min    Short Term Goals: Week 3:  OT Short Term Goal 1 (Week 3): Pt will don socks with max A using AE PRN OT Short Term Goal 2 (Week 3): Pt will perform stand pivot transfer to [facility] with max A+1 OT Short Term Goal 3 (Week 3): Pt will thread pants over BLE using AE PRN  Skilled Therapeutic Interventions/Progress Updates:    Pt resting in bed upon arrival. MD entered room during session and cleared pt to take a shower. Discussed arrangments and how that would happen. Pt excited but somewhat apprehensive. OT intervention with focus on bed monbility, UB bathing/dressing, Sit<>stand, side stepping with RW, gentle stretching of trunk, and BUE therex with green theraband. UB bathing/dressing with superviison. Pt performed trunk rotations and flexion. Myofascial Release to Rt lower back. Pt returned to supine and completed BUE therex. Pt demonstrated punches and shoulder abducitons 3x10. Pt remained in bed with all needs within reach and mother present.   Therapy Documentation Precautions:  Precautions Precautions: Fall Precaution Comments: foley catheter, large buttocks wound, R heel wound Restrictions Weight Bearing Restrictions: No Pain: Pain Assessment Pain Scale: 0-10 Pain Score: 8  Pain Type: Acute pain Pain Location: Generalized Pain Descriptors / Indicators: Aching;Burning;Sharp;Throbbing Pain Frequency: Constant Pain Onset: On-going Pain Intervention(s): premedicated and repositioned      Therapy/Group: Individual Therapy  [name] 08/26/2020, 9:02 AM

## 2020-08-27 LAB — GLUCOSE, CAPILLARY
Glucose-Capillary: 133 mg/dL — ABNORMAL HIGH (ref 70–99)
Glucose-Capillary: 103 mg/dL — ABNORMAL HIGH (ref 70–99)
Glucose-Capillary: 126 mg/dL — ABNORMAL HIGH (ref 70–99)
Glucose-Capillary: 170 mg/dL — ABNORMAL HIGH (ref 70–99)
Glucose-Capillary: 117 mg/dL — ABNORMAL HIGH (ref 70–99)

## 2020-08-27 NOTE — Progress Notes (Signed)
PROGRESS NOTE   Subjective/Complaints:  Pt reports did dry run yesterday for doing shower today- w/c doesn't fit in bathroom- but found a way on what to do.  Is sad Carl Hill's last day is Thursday- graduating.   Walked 45 ft at once with RW  Black scab fell off R heel.   ROS:   Pt denies SOB, abd pain, CP, N/V/C/D, and vision changes   Objective:   No results found. Recent Labs    08/26/20 0834  WBC 9.1  HGB 11.8*  HCT 38.8*  PLT 375   Recent Labs    08/26/20 0834  NA 137  K 3.9  CL 101  CO2 26  GLUCOSE 159*  BUN 6  CREATININE 0.96  CALCIUM 9.2    Intake/Output Summary (Last 24 hours) at 08/27/2020 1010 Last data filed at 08/27/2020 0330 Gross per 24 hour  Intake 360 ml  Output 4625 ml  Net -4265 ml     Pressure Injury 08/06/20 Heel Right Stage 2 -  Partial thickness loss of dermis presenting as a shallow open injury with a red, pink wound bed without slough. (Active)  08/06/20 0240  Location: Heel  Location Orientation: Right  Staging: Stage 2 -  Partial thickness loss of dermis presenting as a shallow open injury with a red, pink wound bed without slough.  Wound Description (Comments):   Present on Admission: Yes    Physical Exam: Vital Signs Blood pressure 137/80, pulse 95, temperature 98.2 F (36.8 C), temperature source Oral, resp. rate 18, height 5\' 10"  (1.778 m), weight (!) 228.4 kg, SpO2 97 %.     General: awake, alert, appropriate, on bariatric low air loss mattress,  NAD HENT: conjugate gaze; oropharynx moist CV: regular rate; no JVD Pulmonary: CTA B/L; no W/R/R- good air movement GI: soft, NT, ND, (+)BS, protuberant, BMI 72 Psychiatric: appropriate Neurological: Ox3 Skin: Warm and dry.  Post op sacral wound not examined today- however R heel the black scab fell off- still has a lot of peeling skin/callus on R heel  Vascular changes bilateral lower extremities- no change Psych:  Normal mood.  Normal behavior. Musc: No edema in extremities.  No tenderness in extremities. Neuro: Alert Motor: Bilateral upper extremities: 5/5 throughout, unchanged Bilateral lower extremities: Hip flexion grossly 4+/5, distally 5/5, unchanged  Assessment/Plan: 1. Functional deficits which require 3+ hours per day of interdisciplinary therapy in a comprehensive inpatient rehab setting.  Physiatrist is providing close team supervision and 24 hour management of active medical problems listed below.  Physiatrist and rehab team continue to assess barriers to discharge/monitor patient progress toward functional and medical goals  Care Tool:  Bathing    Body parts bathed by patient: Right arm,Left arm,Chest,Abdomen,Face,Right upper leg,Left upper leg   Body parts bathed by helper: Front perineal area,Buttocks,Right lower leg,Left lower leg     Bathing assist Assist Level: Maximal Assistance - Patient 24 - 49%     Upper Body Dressing/Undressing Upper body dressing   What is the patient wearing?: Pull over shirt    Upper body assist Assist Level: Supervision/Verbal cueing    Lower Body Dressing/Undressing Lower body dressing      What is  the patient wearing?: Incontinence brief     Lower body assist Assist for lower body dressing: Maximal Assistance - Patient 25 - 49%     Toileting Toileting    Toileting assist Assist for toileting: Dependent - Patient 0%     Transfers Chair/bed transfer  Transfers assist  Chair/bed transfer activity did not occur: Safety/medical concerns  Chair/bed transfer assist level: 2 Helpers     Locomotion Ambulation   Ambulation assist   Ambulation activity did not occur: Safety/medical concerns  Assist level: 2 helpers Assistive device: Walker-rolling Max distance: 45'   Walk 10 feet activity   Assist  Walk 10 feet activity did not occur: Safety/medical concerns  Assist level: 2 helpers Assistive device: Walker-rolling    Walk 50 feet activity   Assist Walk 50 feet with 2 turns activity did not occur: Safety/medical concerns         Walk 150 feet activity   Assist Walk 150 feet activity did not occur: Safety/medical concerns         Walk 10 feet on uneven surface  activity   Assist Walk 10 feet on uneven surfaces activity did not occur: Safety/medical concerns         Wheelchair     Assist Will patient use wheelchair at discharge?: Yes Type of Wheelchair: Manual Wheelchair activity did not occur: Safety/medical concerns  Wheelchair assist level: Supervision/Verbal cueing Max wheelchair distance: 16'    Wheelchair 50 feet with 2 turns activity    Assist    Wheelchair 50 feet with 2 turns activity did not occur: Safety/medical concerns   Assist Level: Supervision/Verbal cueing   Wheelchair 150 feet activity     Assist  Wheelchair 150 feet activity did not occur: Safety/medical concerns       Blood pressure 137/80, pulse 95, temperature 98.2 F (36.8 C), temperature source Oral, resp. rate 18, height 5\' 10"  (1.778 m), weight (!) 228.4 kg, SpO2 97 %.  Medical Problem List and Plan: 1.  Debility secondary to septic shock from necrotizing soft tissue infection of perianal, gluteal, and perineal tissues.  3/28- con't PT and PT- progressing- walked 100 ft this weekend, total- see if pt can get into shower?- Will change dressing afterwards 3/29- walked 45 ft at once yesterday- doing shower today- did dry run as well- con't PT and OT 2.  Antithrombotics: -DVT/anticoagulation:  Pharmaceutical:   Xarelto             -antiplatelet therapy: N/A 3. Pain Management:  Continue Lyrica and elavil for neuropathy.              Oxycontin 15 mg bid with 10 mg prn for breakthrough pain.   On robaxin 750 mg q6 hours prn  3/29- pain controlled on current regimen- con't regimen- will need to discuss with pt, will likely need to reduce pain meds prior to d/c to get it covered by  insurance.  4. Mood: LCSW to follow for evaluation and support.              -antipsychotic agents: N/A 5. Neuropsych: This patient is capable of making decisions on his own behalf. 6. Skin/Wound Care: Air mattress for pressure relief measures.              --will need local care frequently to keep sacral wound bed clean.             --Continue foley for now.  Added hemorrhoidal cream to assist with any pain which might be  ano-rectal/hemorrhoidal 7. Fluids/Electrolytes/Nutrition: Monitor I/Os.  8. Necrotizing fascitis: Has been on Augmentin since 02/11--> to continue for 6 months per ID 9. T2DM with neuropathy: Hgb A1c- 9.6 (down from 13 in Aug) Will continue to monitor BS ac/hs and use SSI for elevated BS             --used Farxiga, metformin and Victoza 1.8.             --Continue Levemir bid and titrate as indicated  -decreased novolog to 10 units TID with meals   Slightly elevated on 3/26  3/28- A1c 5.9- BGs- 107 to 133- con't regimen  3/29- BGs 107-133- well controlled- con't regimen 10 Hyponatremia:              Sodium 138 on 3/21, labs ordered for Monday 11. Hypokalemia:    Potassium 3.8 on 3/21 12. Proximal A FIb: Monitor HR tid--continue Cardizem 240 mg and amiodarone for rate control.              --monitor for symptoms with increase in activity.  Controlled on 3/26 13. OIC:              Discontinued colace   Improved  14. Chronic right heel ulcer: Continue to paint wound daily with iodine.   3/16- is Stage II- healing  3/29- added vaseline daily- less, but still has peeling skin- asked him to scrub in shower today 15.  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 Will need bariatric equipment.  3/28- BMI down to 72 16. Trach site  3/15- resolved/healed 17. Foley for urinary retention  3/23- pt wants to go home with foley- will need f/u with Urology to get out- since bladder, being a muscle, will be weak. Will need bariatric BSC if at possible.  18. Muscle  spasms  See #3  3/29- doing better on Robaxin- con't regimen  19. Disposition:   At baseline, patient received significant assistance from his mother, aged 83. He required her assistance to wipe stool after BM due to body habitus. He was able to ambulate to bathroom and back on his own. Plan is for discharge home to father, who is a IT sales professional, and who can provide more physical support to patient. Goal is ModA level for ambulation within his room.   3/22- pt insists going home with mother- says she can do wound care better- will go to fathers when mother has surgery.    3/23- mother's surgery on 4/29  LOS: 21 days A FACE TO FACE EVALUATION WAS PERFORMED  Carl Hill 08/27/2020, 10:10 AM

## 2020-08-27 NOTE — Progress Notes (Signed)
Pt dressing change completed to coccyx. Premedicated with dilaudid, see MAR. No complications noted. Pt tolerated well.  Mylo Red, LPN

## 2020-08-27 NOTE — Progress Notes (Addendum)
Sterile technique used to insert 83F foley. Pt tolerated well. No complications noted. Foreskin replaced. Pt educated on importance of foley exchanges.  Mylo Red, LPN

## 2020-08-27 NOTE — Progress Notes (Signed)
Occupational Therapy Session Note  Patient Details  Name: Carl Hill MRN: [id number] Date of Birth: 1971-03-17  Today's Date: 08/27/2020 OT Individual Time: 1962-2297 OT Individual Time Calculation (min): 70 min    Short Term Goals: Week 3:  OT Short Term Goal 1 (Week 3): Pt will don socks with max A using AE PRN OT Short Term Goal 2 (Week 3): Pt will perform stand pivot transfer to [facility] with max A+1 OT Short Term Goal 3 (Week 3): Pt will thread pants over BLE using AE PRN  Skilled Therapeutic Interventions/Progress Updates:    OT intervention with focus on bed mobility, functional amb with RW, bathing at shower level, standing balance, activity tolerance, and safety awareness to increase independence with BADLS. Bed mobility with supervision. Sit<>stand with supervision. Amb with RW (w/c follow) to bathroom with CGA/supervision. Pt used LH sponge to bathe BLE. Sit<>stand from [facility] in shower and amb back to room to sit EOB. Sit>supine with supervision. Pt repositioned to have dressing change on buttocks. Pt remained in bed awaiting RN. All needs within reach.   Therapy Documentation Precautions:  Precautions Precautions: Fall Precaution Comments: foley catheter, large buttocks wound, R heel wound Restrictions Weight Bearing Restrictions: No   Pain: Pain Assessment Pain Scale: 0-10 Pain Score: 8  Pain Location: buttocks Pain Descriptors / Indicators: Aching;Discomfort Pain Intervention(s): RN aware, repositioned  Therapy/Group: Individual Therapy  [name] 08/27/2020, 11:31 AM

## 2020-08-27 NOTE — Progress Notes (Signed)
Patient ID: Carl Hill, male   DOB: [birth year], 50 y.o.   MRN: [id number]  SW explored following HHAs which were declined:  [facility], [facility], [facility], [facility], [facility] (due to high acuity), [facility], [facility] (out of network with insurance), [facility] (staffing), and [facility].   Cecile Sheerer, MSW, LCSWA Office: [phone] Cell: [phone] Fax: [phone]

## 2020-08-27 NOTE — Progress Notes (Signed)
Physical Therapy Session Note  Patient Details  Name: Carl Hill MRN: [id number] Date of Birth: April 10, 1971  Today's Date: 08/27/2020 PT Individual Time:1300-1400, 2878-6767 PT Individual Time Calculation (min): 60 min, 15 min   Short Term Goals:  Week 3:  PT Short Term Goal 1 (Week 3): Pt will stand with RW x 2 minutes with min A x 2. PT Short Term Goal 2 (Week 3): Pt will tolerate >20 min of activity without incidence of pain. PT Short Term Goal 3 (Week 3): Pt will perform gait x 50 with RW and min A of 2  Skilled Therapeutic Interventions/Progress Updates:    Session 1: Pt received semi-reclined in bed, agreeable to therapy. Bed mobility with supervision and heavy use of bed features. Donned shorts, socks, shoes, and knee sleeve with assist. STS from EOB with CGA x 2 and RW, STS from w/c with mod-max A x 1, CGA x 1 to block knees at times. Pt performed stand pivot transfers with RW and CGA x 2 throughout session. Pt propelled w/c with BLE for global LE strengthening 2  x 100 ft with supervision. Pt performed step taps on 4" step 2 x 10 with RW and CGA. Elevated lunge on 4" step 2 x 10 with RW and CGA. Pt ambulated x 45', x 47' with seated rest breaks with CGA and close w/c follow for safety. Pt transferred to bariatric recliner and positioned for comfort. Pt left with all needs in reach and legs elevated.   Session 2: Pt supine in bed on arrival with NT present taking vitals, reporting he was able to sit in recliner ~20 minutes before it became too painful and nsg assisted him back to bed. Assisted pt with doffing shorts, knee sleeve and socks, as well as re-positioning for comfort. Pt was left with all needs in reach. Pt missed 15 min due to pain.  Therapy Documentation Precautions:  Precautions Precautions: Fall Precaution Comments: foley catheter, large buttocks wound, R heel wound Restrictions Weight Bearing Restrictions: No General: PT Amount of Missed Time (min): 15  Minutes PT Missed Treatment Reason: Pain    Therapy/Group: Individual Therapy  Dyane Dustman, SPT 08/27/2020, 3:41 PM

## 2020-08-27 NOTE — Patient Care Conference (Signed)
Inpatient RehabilitationTeam Conference and Plan of Care Update Date: 08/27/2020   Time: 11:14 AM    Patient Name: Carl Hill      Medical Record Number: [id number]  Date of Birth: [birth year] Sex: Male         Room/Bed: 4W14C/4W14C-01 Payor Info: Payor: MEDICAID [place] / Plan: MEDICAID [place] ACCESS / Product Type: *No Product type* /    Admit Date/Time:  08/06/2020  1:01 AM  Primary Diagnosis:  Debility  Hospital Problems: Principal Problem:   Debility Active Problems:   Super-super obese (HCC)   Urinary retention   Hypokalemia   Labile blood glucose   Diabetic peripheral neuropathy (HCC)   Sacral pain   Hypoglycemia   Muscle spasms of both lower extremities   Recurrent knee instability, right   H/O small bowel obstruction   Muscle spasm    Expected Discharge Date: Expected Discharge Date: 09/10/20  Team Members Present: Physician leading conference: Dr. Genice Rouge Care Coodinator Present: Cecile Sheerer, LCSWA;Casidee Jann Marlyne Beards, RN, BSN, CRRN Nurse Present: Kennyth Arnold, RN PT Present: Peter Congo, PT OT Present: Ardis Rowan, COTA;Jennifer Katrinka Blazing, OT PPS Coordinator present : Fae Pippin, SLP     Current Status/Progress Goal Weekly Team Focus  Bowel/Bladder   Foley catheter for wound healing, incontinent of stool LBM 08/26/20  regular BMs  QS/PRN assessment of tolieting needs   Swallow/Nutrition/ Hydration             ADL's   UB bathing/drssing-supervison seated EOB, LB bathing-max A; LB dressing-max A; toileting-max A; Sit<>stand with RW-min A  min A/CGA overall  sit<>stand, standing balance, BADL training, safety awareness, educaiton   Mobility   bed mobility Supervision to CGA with bedrail, +2 to stand to RW from various height surfaces, gait with RW up to 45' wtih +2 for safety  min A overall  progressing gait distance and tolerance, LE strengthening, standing balance, safety awareness   Communication             Safety/Cognition/ Behavioral  Observations            Pain   Has schedule  Pain rating of <3/10  QS/PRN assessment with medication adminstered as ordered   Skin   Stage to buttock with dressing changes per ordered BID ans PRN  Prevention of infection and improvement of wound  Assessment QS/PRN providing wound care as ordered, continue to educate staff, family and all involved in treatment plans     Discharge Planning:  Pt to d/c to mother's home. Mother is expected to have surery to remove a polyp on 4/29. Mother will only be able to provide supervision level of care once surgery has been completed. Pt father does not live in the home and can assist PRN.   Team Discussion: Right heel is looking better, asked him to scrub in the shower to remove the dead skin. Decreasing some of the PO medications. Patient has foley in place, need to find out date of insertion. Patient has 8/10 pain, scheduled and prn medication given, right heel no longer a stage 2, will update, continue dressing changes to open wound on bottom. Educating on HTN, diabetes management, and wound care and dressing changes. Patient's mother is having surgery at the end of April, at the point dad will assist. Patient may have to go to a wound care clinic. Patient on target to meet rehab goals: Yes, Walked from dayroom to patient room with [facility] follow and +2 for safety, progressing with gait and safety. Sit  to stands and ambulation are at a contact guard, he is quite limber for his size, he did scrub his feet with the aid of AD. Worked on stretching.  *See Care Plan and progress notes for long and short-term goals.   Revisions to Treatment Plan:  MD working on reducing some pain medications before discharge.  Teaching Needs: Family education, medication management, pain management, diabetes management,bowel/bladder management, foley care education, skin/wound care, dressing changes, transfer training, gait training, balance training, endurance training, safety  awareness.  Current Barriers to Discharge: Decreased caregiver support, Medical stability, Home enviroment access/layout, Incontinence, Wound care, Lack of/limited family support, Weight, Medication compliance and Behavior  Possible Resolutions to Barriers: Continue current medications, provide emotional support.     Medical Summary Current Status: still has foley- will go home with it-8/10 pain per nursing; on prns and oxycontin; DM much better controlled-  Barriers to Discharge: Decreased family/caregiver support;Home enviroment access/layout;Medical stability;Weight;Wound care;Incontinence  Barriers to Discharge Comments: BMI of 72; R heel looks better- scab has come off; post op wound on backside- bowel incontinence due to free floating rectum; Possible Resolutions to [employer] Focus: shower today- arranging for this; will try and reduce pain meds before d/c- because cannot send home on that much pain meds- goals pain control and wound care. working on ramp; cost is an issue- d/c date 4/12   Continued Need for Acute Rehabilitation Level of Care: The patient requires daily medical management by a physician with specialized training in physical medicine and rehabilitation for the following reasons: Direction of a multidisciplinary physical rehabilitation program to maximize functional independence : Yes Medical management of patient stability for increased activity during participation in an intensive rehabilitation regime.: Yes Analysis of laboratory values and/or radiology reports with any subsequent need for medication adjustment and/or medical intervention. : Yes   I attest that I was present, lead the team conference, and concur with the assessment and plan of the team.   Tennis Must 08/27/2020, 5:21 PM

## 2020-08-28 LAB — GLUCOSE, CAPILLARY
Glucose-Capillary: 93 mg/dL (ref 70–99)
Glucose-Capillary: 153 mg/dL — ABNORMAL HIGH (ref 70–99)
Glucose-Capillary: 138 mg/dL — ABNORMAL HIGH (ref 70–99)
Glucose-Capillary: 127 mg/dL — ABNORMAL HIGH (ref 70–99)
Glucose-Capillary: 120 mg/dL — ABNORMAL HIGH (ref 70–99)

## 2020-08-28 NOTE — Progress Notes (Signed)
Occupational Therapy Note  Patient Details  Name: Carl Hill MRN: [id number] Date of Birth: [birth year]  Today's Date: 08/28/2020 OT Missed Time: 60 Minutes Missed Time Reason: Nursing care  Pt missed 60 mins skilled OT services. Pt with BM at bed level and RN changing dressing on wound. Will attempt to see later.    Lavone Neri [facility] 08/28/2020, 11:53 AM

## 2020-08-28 NOTE — Progress Notes (Signed)
Physical Therapy Weekly Progress Note  Patient Details  Name: Carl Hill MRN: [id number] Date of Birth: 06/10/1970  Beginning of progress report period: August 21, 2020 End of progress report period: August 28, 2020  Today's Date: 08/28/2020 PT Individual Time: 1045-1203 PT Individual Time Calculation (min): 78 min   Patient has met 2 of 3 short term goals. Bed mobility with supervision and use of bed features. STS from EOB with CGA x 2 and RW, requires up to max A  from w/c to RW when fatigued. Gait up to 50 ft with RW, CGA x1 and close wheelchair follow. Pt demonstrates improving endurance and activity tolerance.   Patient continues to demonstrate the following deficits muscle weakness, decreased cardiorespiratoy endurance, neuropathy and decreased balance strategies and therefore will continue to benefit from skilled PT intervention to increase functional independence with mobility.  Patient progressing toward long term goals..  Continue plan of care.  PT Short Term Goals Week 3:  PT Short Term Goal 1 (Week 3): Pt will stand with RW x 2 minutes with min A x 2. PT Short Term Goal 1 - Progress (Week 3): Met PT Short Term Goal 2 (Week 3): Pt will tolerate >20 min of activity without incidence of pain. PT Short Term Goal 2 - Progress (Week 3): Progressing toward goal PT Short Term Goal 3 (Week 3): Pt will perform gait x 50 with RW and min A of 2 PT Short Term Goal 3 - Progress (Week 3): Met Week 4:  PT Short Term Goal 1 (Week 4): Pt will ambulate x 100 ft with RW and CGA x 2 PT Short Term Goal 2 (Week 4): Pt will maintain dynamic standing balance x 2 min with LRAD and CGA x 2. PT Short Term Goal 3 (Week 4): Pt will self propel w/c with supervision x 300 ft.  Skilled Therapeutic Interventions/Progress Updates:  Pt received semi-reclined in bed, without dressing in place at scheduled time. Nursing became available to dress wound, therapy returned later to give nursing time to dress  wound.   Pt semi-reclined in bed on arrival, agreeable to therapy. Bed mobility with supervision and heavy use of bed features. Pt performed STS from EOB with supervision x 2 and RW, required up to max A to stand from w/c with fatigue. Stand pivot transfers with RW and CGA x 2 throughout session. Pt propelled w/c with BLE x 50 ft for global LE strengthening and cardiovascular endurance. Gait x 150 ft with 3 seated rest breaks, with RW and CGA and close w/c follow. Pt performed step overs with hockey stick x 4 bouts with RW and CGA x 2. Pt lost balance x 2 and sat quickly on mat table with min A to control descent to table. No episodes of knee buckling noted, but pt stated that his R knee felt "shaky." Pt returned to bed after session with supervision and was left with all needs in reach.   Therapy Documentation Precautions:  Precautions Precautions: Fall Precaution Comments: foley catheter, large buttocks wound, R heel wound Restrictions Weight Bearing Restrictions: No   Therapy/Group: Individual Therapy  Carl Hill 08/28/2020, 12:43 PM   

## 2020-08-28 NOTE — Progress Notes (Signed)
Patient ID: Carl Hill, male   DOB: January 18, 1971, 50 y.o.   MRN: [id number]  SW met pt in room to provide updates from team conference, and change in d/c date from 4/5 to 4/12 to help pt to become more independent. SW informed on challenges with HHAs, and will continue to try to establish. Family edu Wednesday (4/6) 10am-3pm.  SW faxed HHPT/OT/SN referral to  [facility] ([phone]/f:[phone]) and waiting on follow-up.  *SW spoke with Erica/Intake with [facility] to follow-up on referral. Reports no staffing in [place] and no PT as well.   Loralee Pacas, MSW, [place] Office: [phone] Cell: [phone] Fax: [phone]

## 2020-08-28 NOTE — Progress Notes (Signed)
PROGRESS NOTE   Subjective/Complaints:   Pt reports had BM this AM- incontinent-  Took a shower- went well.  We discussed that cannot send him home on current regimen- will need to send home on either Oxy or Dilaudid, not both- because also sending home on Oxycontin.   ROS:   Pt denies SOB, abd pain, CP, N/V/C/D, and vision changes   Objective:   No results found. Recent Labs    08/26/20 0834  WBC 9.1  HGB 11.8*  HCT 38.8*  PLT 375   Recent Labs    08/26/20 0834  NA 137  K 3.9  CL 101  CO2 26  GLUCOSE 159*  BUN 6  CREATININE 0.96  CALCIUM 9.2    Intake/Output Summary (Last 24 hours) at 08/28/2020 1845 Last data filed at 08/28/2020 0750 Gross per 24 hour  Intake 300 ml  Output 3300 ml  Net -3000 ml     Pressure Injury 08/06/20 Heel Right Stage 2 -  Partial thickness loss of dermis presenting as a shallow open injury with a red, pink wound bed without slough. (Active)  08/06/20 0240  Location: Heel  Location Orientation: Right  Staging: Stage 2 -  Partial thickness loss of dermis presenting as a shallow open injury with a red, pink wound bed without slough.  Wound Description (Comments):   Present on Admission: Yes    Physical Exam: Vital Signs Blood pressure 119/63, pulse 96, temperature 98.3 F (36.8 C), resp. rate 16, height 5\' 10"  (1.778 m), weight (!) 228.1 kg, SpO2 93 %.      General: awake, alert, appropriate, BMI 72; on bariatric low air loss bed; NAD HENT: conjugate gaze; oropharynx moist CV: regular rate; no JVD Pulmonary: CTA B/L; no W/R/R- good air movement GI: soft, NT, ND, (+)BS Psychiatric: appropriate; interactive not as bright today Neurological: Ox3 Skin: Warm and dry.  Post op sacral wound not examined today- however R heel the black scab fell off- still has a lot of peeling skin/callus on R heel  Vascular changes bilateral lower extremities- no change Psych: Normal mood.   Normal behavior. Musc: No edema in extremities.  No tenderness in extremities. Neuro: Alert Motor: Bilateral upper extremities: 5/5 throughout, unchanged Bilateral lower extremities: Hip flexion grossly 4+/5, distally 5/5, unchanged  Assessment/Plan: 1. Functional deficits which require 3+ hours per day of interdisciplinary therapy in a comprehensive inpatient rehab setting.  Physiatrist is providing close team supervision and 24 hour management of active medical problems listed below.  Physiatrist and rehab team continue to assess barriers to discharge/monitor patient progress toward functional and medical goals  Care Tool:  Bathing    Body parts bathed by patient: Right arm,Left arm,Chest,Abdomen,Front perineal area,Right upper leg,Left upper leg,Right lower leg,Left lower leg,Face   Body parts bathed by helper: Buttocks     Bathing assist Assist Level: Minimal Assistance - Patient > 75%     Upper Body Dressing/Undressing Upper body dressing   What is the patient wearing?: Pull over shirt    Upper body assist Assist Level: Supervision/Verbal cueing    Lower Body Dressing/Undressing Lower body dressing      What is the patient wearing?: Incontinence brief  Lower body assist Assist for lower body dressing: Maximal Assistance - Patient 25 - 49%     Toileting Toileting    Toileting assist Assist for toileting: Dependent - Patient 0%     Transfers Chair/bed transfer  Transfers assist  Chair/bed transfer activity did not occur: Safety/medical concerns  Chair/bed transfer assist level: 2 Helpers     Locomotion Ambulation   Ambulation assist   Ambulation activity did not occur: Safety/medical concerns  Assist level: 2 helpers Assistive device: Walker-rolling Max distance: 50 ft   Walk 10 feet activity   Assist  Walk 10 feet activity did not occur: Safety/medical concerns  Assist level: 2 helpers Assistive device: Walker-rolling   Walk 50 feet  activity   Assist Walk 50 feet with 2 turns activity did not occur: Safety/medical concerns    Assistive device: Walker-rolling    Walk 150 feet activity   Assist Walk 150 feet activity did not occur: Safety/medical concerns         Walk 10 feet on uneven surface  activity   Assist Walk 10 feet on uneven surfaces activity did not occur: Safety/medical concerns         Wheelchair     Assist Will patient use wheelchair at discharge?: Yes Type of Wheelchair: Manual Wheelchair activity did not occur: Safety/medical concerns  Wheelchair assist level: Supervision/Verbal cueing Max wheelchair distance: 100'    Wheelchair 50 feet with 2 turns activity    Assist    Wheelchair 50 feet with 2 turns activity did not occur: Safety/medical concerns   Assist Level: Supervision/Verbal cueing   Wheelchair 150 feet activity     Assist  Wheelchair 150 feet activity did not occur: Safety/medical concerns       Blood pressure 119/63, pulse 96, temperature 98.3 F (36.8 C), resp. rate 16, height 5\' 10"  (1.778 m), weight (!) 228.1 kg, SpO2 93 %.  Medical Problem List and Plan: 1.  Debility secondary to septic shock from necrotizing soft tissue infection of perianal, gluteal, and perineal tissues.  3/28- con't PT and PT- progressing- walked 100 ft this weekend, total- see if pt can get into shower?- Will change dressing afterwards 3/29- walked 45 ft at once yesterday- doing shower today- did dry run as well- con't PT and OT  3/30- con't PT and OT 2.  Antithrombotics: -DVT/anticoagulation:  Pharmaceutical:   Xarelto             -antiplatelet therapy: N/A 3. Pain Management:  Continue Lyrica and elavil for neuropathy.              Oxycontin 15 mg bid with 10 mg prn for breakthrough pain.   On robaxin 750 mg q6 hours prn  3/29- pain controlled on current regimen- con't regimen- will need to discuss with pt, will likely need to reduce pain meds prior to d/c to get it  covered by insurance.   3/30- d/w pt- will not be able to send home on three opiates- asked him to think about it.  4. Mood: LCSW to follow for evaluation and support.              -antipsychotic agents: N/A 5. Neuropsych: This patient is capable of making decisions on his own behalf. 6. Skin/Wound Care: Air mattress for pressure relief measures.              --will need local care frequently to keep sacral wound bed clean.             --  Continue foley for now.  Added hemorrhoidal cream to assist with any pain which might be ano-rectal/hemorrhoidal 7. Fluids/Electrolytes/Nutrition: Monitor I/Os.  8. Necrotizing fascitis: Has been on Augmentin since 02/11--> to continue for 6 months per ID 9. T2DM with neuropathy: Hgb A1c- 9.6 (down from 13 in Aug) Will continue to monitor BS ac/hs and use SSI for elevated BS             --used Farxiga, metformin and Victoza 1.8.             --Continue Levemir bid and titrate as indicated  -decreased novolog to 10 units TID with meals   Slightly elevated on 3/26  3/28- A1c 5.9- BGs- 107 to 133- con't regimen  3/30- BGs 93-170- only 1 over 155- con't regimen 10 Hyponatremia:              Sodium 138 on 3/21, labs ordered for Monday 11. Hypokalemia:    Potassium 3.8 on 3/21 12. Proximal A FIb: Monitor HR tid--continue Cardizem 240 mg and amiodarone for rate control.              --monitor for symptoms with increase in activity.  Controlled on 3/26 13. OIC:              Discontinued colace   Improved  14. Chronic right heel ulcer: Continue to paint wound daily with iodine.   3/16- is Stage II- healing  3/29- added vaseline daily- less, but still has peeling skin- asked him to scrub in shower today  3/30- pt scrubbed in shower- will con't to monitor- actual wound appears closed now.  15.  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 Will need bariatric equipment.  3/28- BMI down to 72 16. Trach site  3/15- resolved/healed 17. Foley for urinary  retention  3/23- pt wants to go home with foley- will need f/u with Urology to get out- since bladder, being a muscle, will be weak. Will need bariatric BSC if at possible.  18. Muscle spasms  See #3  3/29- doing better on Robaxin- con't regimen  19. Disposition:   At baseline, patient received significant assistance from his mother, aged 63. He required her assistance to wipe stool after BM due to body habitus. He was able to ambulate to bathroom and back on his own. Plan is for discharge home to father, who is a IT sales professional, and who can provide more physical support to patient. Goal is ModA level for ambulation within his room.   3/22- pt insists going home with mother- says she can do wound care better- will go to fathers when mother has surgery.    3/23- mother's surgery on 4/29  3/30- moved d/c date to 4/12 per therapy.    LOS: 22 days A FACE TO FACE EVALUATION WAS PERFORMED  Carl Hill 08/28/2020, 6:45 PM

## 2020-08-28 NOTE — Progress Notes (Signed)
Physical Therapy Session Note  Patient Details  Name: Carl Hill MRN: [id number] Date of Birth: [birth year]  Today's Date: 08/28/2020 PT Individual Time: 3343-5686 PT Individual Time Calculation (min): 43 min   Short Term Goals: Week 4:  PT Short Term Goal 1 (Week 4): Pt will ambulate x 100 ft with RW and CGA x 2 PT Short Term Goal 2 (Week 4): Pt will maintain dynamic standing balance x 2 min with LRAD and CGA x 2. PT Short Term Goal 3 (Week 4): Pt will self propel w/c with supervision x 300 ft.  Skilled Therapeutic Interventions/Progress Updates:     Patient in bed upon PT arrival. Patient alert and agreeable to PT session. Patient reported "blue raspberry" on personal pain/fatigue scale, indicated 9-10/10 back and L thigh pain/fatigue during session, RN made aware. PT provided repositioning, rest breaks, and distraction as pain interventions throughout session. Patient reported muscle spasms in R low back and R quads and discussed goals for improved tolerance with gait activities. Reports R leg gives out due to quad and knee pain intermittently.   Focused session on manual techniques for soft tissue mobilization and trigger point release of R quads, TFL, gluteals, hamstrings, and hip flexors. Patient with jerk response at rectus femorous, TFL and piriformis. Performed prolonged stretch following soft tissue mobilization to respective muscle. Performed quad stretch in supine with R leg dangling off the bed and manual knee flexion over pressure and stabilizing R hip for improved stretch, side-lying TFL stretch with R limb straight and off the bed, cross-body, with stabilization at R hip with continued soft tissue mobilization, R hamstring stretch in supine x30 sec progressing to muscle energy technique for reduced neural stretch with 5 sec contraction and 10 sec relaxation x5.   Patient in bed, reported pain/fatigue at "cherry" level, or 3-4/10 at end of session with breaks locked, bed alarm  set, and all needs within reach.    Therapy Documentation Precautions:  Precautions Precautions: Fall Precaution Comments: foley catheter, large buttocks wound, R heel wound Restrictions Weight Bearing Restrictions: No   Therapy/Group: Individual Therapy  Briya Lookabaugh L Adaley Kiene PT, DPT  08/28/2020, 4:34 PM

## 2020-08-28 NOTE — Progress Notes (Signed)
Physical Therapy Session Note  Patient Details  Name: Carl Hill MRN: [id number] Date of Birth: 17-Jun-1970  Today's Date: 08/28/2020 PT Individual Time: 1635-1700 PT Individual Time Calculation (min): 25 min   Short Term Goals: Week 4:  PT Short Term Goal 1 (Week 4): Pt will ambulate x 100 ft with RW and CGA x 2 PT Short Term Goal 2 (Week 4): Pt will maintain dynamic standing balance x 2 min with LRAD and CGA x 2. PT Short Term Goal 3 (Week 4): Pt will self propel w/c with supervision x 300 ft.  Skilled Therapeutic Interventions/Progress Updates:   Pt received supine in bed and agreeable to PT. PT instructed pt in bed level therex:  SLR,  Hip abduction Heel slide Ankle PF/DF Clam shell.  Each completed x 10 BLE with cues for decreased trunkal compensations, improved ROM, increased hold at end range, and decreased speed. Pt attempted to perform bridge but reports severe LPB with muscle spasm 7/10 in the R side.  RN made aware of pain. Rolling R and L in bed with heavy use of rails for clothing removal at end of session, supervision assist for roll. Mod assist for pants management. Left supine in bed with call bell in reach and all needs met.      Therapy Documentation Precautions:  Precautions Precautions: Fall Precaution Comments: foley catheter, large buttocks wound, R heel wound Restrictions Weight Bearing Restrictions: No Vital Signs: Therapy Vitals Temp: 98.3 F (36.8 C) Pulse Rate: 96 Resp: 16 BP: 119/63 Patient Position (if appropriate): Lying Oxygen Therapy SpO2: 93 % O2 Device: Room Air    Therapy/Group: Individual Therapy  Lorie Phenix 08/28/2020, 5:00 PM

## 2020-08-29 LAB — GLUCOSE, CAPILLARY
Glucose-Capillary: 129 mg/dL — ABNORMAL HIGH (ref 70–99)
Glucose-Capillary: 108 mg/dL — ABNORMAL HIGH (ref 70–99)
Glucose-Capillary: 142 mg/dL — ABNORMAL HIGH (ref 70–99)
Glucose-Capillary: 133 mg/dL — ABNORMAL HIGH (ref 70–99)
Glucose-Capillary: 129 mg/dL — ABNORMAL HIGH (ref 70–99)

## 2020-08-29 MED ORDER — WHITE PETROLATUM EX OINT
TOPICAL_OINTMENT | Freq: Every day | CUTANEOUS | Status: DC
  Administered 2020-08-31 – 2020-09-08 (×5): 0.2 via TOPICAL
  Administered 2020-09-10 – 2020-09-11 (×2): 1 via TOPICAL
  Filled 2020-08-29 (×7): qty 28.35

## 2020-08-29 NOTE — Progress Notes (Signed)
PROGRESS NOTE   Subjective/Complaints:  Pt reports worn out with walking yesterday- [name] worked on R leg tightness/pain- can do all HEP except bridge in his bed-  Family ed next Wednesday.    ROS:   Pt denies SOB, abd pain, CP, N/V/C/D, and vision changes   Objective:   No results found. No results for input(s): WBC, HGB, HCT, PLT in the last 72 hours. No results for input(s): NA, K, CL, CO2, GLUCOSE, BUN, CREATININE, CALCIUM in the last 72 hours.  Intake/Output Summary (Last 24 hours) at 08/29/2020 1005 Last data filed at 08/29/2020 0900 Gross per 24 hour  Intake 900 ml  Output 1550 ml  Net -650 ml     Pressure Injury 08/06/20 Heel Right Stage 2 -  Partial thickness loss of dermis presenting as a shallow open injury with a red, pink wound bed without slough. (Active)  08/06/20 0240  Location: Heel  Location Orientation: Right  Staging: Stage 2 -  Partial thickness loss of dermis presenting as a shallow open injury with a red, pink wound bed without slough.  Wound Description (Comments):   Present on Admission: Yes    Physical Exam: Vital Signs Blood pressure (!) 141/55, pulse 79, temperature 98.1 F (36.7 C), temperature source Oral, resp. rate 18, height 5\' 10"  (1.778 m), weight (!) 228 kg, SpO2 (!) 71 %.       General: awake, alert, appropriate, laying supine in bariatric low air loss mattress; NAD HENT: conjugate gaze; oropharynx moist CV: regular rate; no JVD Pulmonary: CTA B/L; no W/R/R- good air movement GI: soft, NT, ND, (+)BS- BMI 72 Psychiatric: appropriate- interactive Neurological: Ox3 Skin: Warm and dry.  Post op sacral wound not examined today- R heel is callused and peeling slightly, due to dryness-   Vascular changes bilateral lower extremities- no change Musc: No edema in extremities.  No tenderness in extremities. Motor: Bilateral upper extremities: 5/5 throughout, unchanged Bilateral  lower extremities: Hip flexion grossly 4+/5, distally 5/5, unchanged  Assessment/Plan: 1. Functional deficits which require 3+ hours per day of interdisciplinary therapy in a comprehensive inpatient rehab setting.  Physiatrist is providing close team supervision and 24 hour management of active medical problems listed below.  Physiatrist and rehab team continue to assess barriers to discharge/monitor patient progress toward functional and medical goals  Care Tool:  Bathing    Body parts bathed by patient: Right arm,Left arm,Chest,Abdomen,Front perineal area,Right upper leg,Left upper leg,Right lower leg,Left lower leg,Face   Body parts bathed by helper: Buttocks     Bathing assist Assist Level: Minimal Assistance - Patient > 75%     Upper Body Dressing/Undressing Upper body dressing   What is the patient wearing?: Pull over shirt    Upper body assist Assist Level: Supervision/Verbal cueing    Lower Body Dressing/Undressing Lower body dressing      What is the patient wearing?: Incontinence brief     Lower body assist Assist for lower body dressing: Maximal Assistance - Patient 25 - 49%     Toileting Toileting    Toileting assist Assist for toileting: Dependent - Patient 0%     Transfers Chair/bed transfer  Transfers assist  Chair/bed transfer  activity did not occur: Safety/medical concerns  Chair/bed transfer assist level: 2 Helpers     Locomotion Ambulation   Ambulation assist   Ambulation activity did not occur: Safety/medical concerns  Assist level: 2 helpers Assistive device: Walker-rolling Max distance: 50 ft   Walk 10 feet activity   Assist  Walk 10 feet activity did not occur: Safety/medical concerns  Assist level: 2 helpers Assistive device: Walker-rolling   Walk 50 feet activity   Assist Walk 50 feet with 2 turns activity did not occur: Safety/medical concerns    Assistive device: Walker-rolling    Walk 150 feet  activity   Assist Walk 150 feet activity did not occur: Safety/medical concerns         Walk 10 feet on uneven surface  activity   Assist Walk 10 feet on uneven surfaces activity did not occur: Safety/medical concerns         Wheelchair     Assist Will patient use wheelchair at discharge?: Yes Type of Wheelchair: Manual Wheelchair activity did not occur: Safety/medical concerns  Wheelchair assist level: Supervision/Verbal cueing Max wheelchair distance: 100'    Wheelchair 50 feet with 2 turns activity    Assist    Wheelchair 50 feet with 2 turns activity did not occur: Safety/medical concerns   Assist Level: Supervision/Verbal cueing   Wheelchair 150 feet activity     Assist  Wheelchair 150 feet activity did not occur: Safety/medical concerns       Blood pressure (!) 141/55, pulse 79, temperature 98.1 F (36.7 C), temperature source Oral, resp. rate 18, height 5\' 10"  (1.778 m), weight (!) 228 kg, SpO2 (!) 71 %.  Medical Problem List and Plan: 1.  Debility secondary to septic shock from necrotizing soft tissue infection of perianal, gluteal, and perineal tissues.  3/28- con't PT and PT- progressing- walked 100 ft this weekend, total- see if pt can get into shower?- Will change dressing afterwards 3/29- walked 45 ft at once yesterday- doing shower today- did dry run as well- con't PT and OT  3/31- [name] did muscle release on him yesterday- con't PT and OT 2.  Antithrombotics: -DVT/anticoagulation:  Pharmaceutical:   Xarelto             -antiplatelet therapy: N/A 3. Pain Management:  Continue Lyrica and elavil for neuropathy.              Oxycontin 15 mg bid with 10 mg prn for breakthrough pain.   On robaxin 750 mg q6 hours prn  3/29- pain controlled on current regimen- con't regimen- will need to discuss with pt, will likely need to reduce pain meds prior to d/c to get it covered by insurance.   3/30- d/w pt- will not be able to send home on three  opiates- asked him to think about it.  4. Mood: LCSW to follow for evaluation and support.              -antipsychotic agents: N/A 5. Neuropsych: This patient is capable of making decisions on his own behalf. 6. Skin/Wound Care: Air mattress for pressure relief measures.              --will need local care frequently to keep sacral wound bed clean.             --Continue foley for now.  Added hemorrhoidal cream to assist with any pain which might be ano-rectal/hemorrhoidal 7. Fluids/Electrolytes/Nutrition: Monitor I/Os.  8. Necrotizing fascitis: Has been on Augmentin since 02/11--> to continue  for 6 months per ID 9. T2DM with neuropathy: Hgb A1c- 9.6 (down from 13 in Aug) Will continue to monitor BS ac/hs and use SSI for elevated BS             --used Farxiga, metformin and Victoza 1.8.             --Continue Levemir bid and titrate as indicated  -decreased novolog to 10 units TID with meals   3/28- A1c 5.9- BGs- 107 to 133- con't regimen  3/31- BGs 120s-150s- con't regimen 10 Hyponatremia:              Sodium 138 on 3/21, labs ordered for Monday 11. Hypokalemia:    Potassium 3.8 on 3/21 12. Proximal A FIb: Monitor HR tid--continue Cardizem 240 mg and amiodarone for rate control.              --monitor for symptoms with increase in activity.  Controlled on 3/26 13. OIC:              Discontinued colace   Improved  14. Chronic right heel ulcer: Continue to paint wound daily with iodine.   3/16- is Stage II- healing  3/29- added vaseline daily- less, but still has peeling skin- asked him to scrub in shower today  3/30- pt scrubbed in shower- will con't to monitor- actual wound appears closed now.   3/31- resolved- will d/c wound care orders and place vaseline order nightly to B/L feet and cover with socks 15.  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 Will need bariatric equipment.  3/28- BMI down to 72 16. Trach site  3/15- resolved/healed 17. Foley for urinary  retention  3/23- pt wants to go home with foley- will need f/u with Urology to get out- since bladder, being a muscle, will be weak. Will need bariatric BSC if at possible.  3/31- pt foley is required due to wounds and urinary retention- con't foley and monitor- con't [facility] baths  18. Muscle spasms  See #3  3/29- doing better on Robaxin- con't regimen  19. Disposition:   At baseline, patient received significant assistance from his mother, aged 64. He required her assistance to wipe stool after BM due to body habitus. He was able to ambulate to bathroom and back on his own. Plan is for discharge home to father, who is a IT sales professional, and who can provide more physical support to patient. Goal is ModA level for ambulation within his room.   3/22- pt insists going home with mother- says she can do wound care better- will go to fathers when mother has surgery.    3/23- mother's surgery on 4/29  3/30- moved d/c date to 4/12 per therapy.    LOS: 23 days A FACE TO FACE EVALUATION WAS PERFORMED  Carl Hill 08/29/2020, 10:05 AM

## 2020-08-29 NOTE — Progress Notes (Signed)
Occupational Therapy Weekly Progress Note  Patient Details  Name: Carl Hill MRN: [id number] Date of Birth: 1971-04-17  Beginning of progress report period: August 21, 2020 End of progress report period: August 29, 2020  Patient has met 2 of 3 short term goals.  Pt is making steady progress with functional mobility and BADLs. Pt able to use wide sock aid to don socks with max A but is dependent with donning/fastening shoes. Stand pivot transfers with mod A. UB bathing/dressing with supervision seated EOB and min A for bathing at shower level. Amb with bathroom with min A and w/c follow. Sit<>stand from [facility] in shower with min A. Pt's mother has observed therapy but has not been actively involved in care.  Patient continues to demonstrate the following deficits: muscle weakness and acute pain , decreased cardiorespiratoy endurance and decreased standing balance and decreased balance strategies and therefore will continue to benefit from skilled OT intervention to enhance overall performance with BADL and Reduce care partner burden.  Patient progressing toward long term goals..  Continue plan of care.  OT Short Term Goals Week 3:  OT Short Term Goal 1 (Week 3): Pt will don socks with max A using AE PRN OT Short Term Goal 1 - Progress (Week 3): Met OT Short Term Goal 2 (Week 3): Pt will perform stand pivot transfer to [facility] with max A+1 OT Short Term Goal 2 - Progress (Week 3): Met OT Short Term Goal 3 (Week 3): Pt will thread pants over BLE using AE PRN OT Short Term Goal 3 - Progress (Week 3): Progressing toward goal Week 4:  OT Short Term Goal 2 (Week 4): Pt will thread pants over BLE using AE PRN OT Short Term Goal 3 (Week 4): Pt will perform stand pivot transfers to [facility] with min A OT Short Term Goal 4 (Week 4): Pt will perform toileting tasks with max A    [name] 08/29/2020, 7:00 AM

## 2020-08-29 NOTE — Progress Notes (Signed)
Physical Therapy Session Note  Patient Details  Name: Carl Hill MRN: [id number] Date of Birth: May 07, 1971  Today's Date: 08/29/2020 PT Individual Time: 0830-0930, 1430-1520 PT Individual Time Calculation (min): 60 min, 50 min   Short Term Goals: Week 4:  PT Short Term Goal 1 (Week 4): Pt will ambulate x 100 ft with RW and CGA x 2 PT Short Term Goal 2 (Week 4): Pt will maintain dynamic standing balance x 2 min with LRAD and CGA x 2. PT Short Term Goal 3 (Week 4): Pt will self propel w/c with supervision x 300 ft.  Skilled Therapeutic Interventions/Progress Updates:   Session 1: Pt received semi-reclined in bed and agreeable to therapy. Supine<>EOB with supervision and use of bed features. Donned socks, shoes, shorts, and compression sleeve with assistance. Pt walked x 50 ft, x 54 ft, x 42 ft with RW, supervision x 2, and w/c follow. EOM>supine on mat table with supervision. Pt performed bridges on mat table for HEP instruction. Supine>EOM with mod A to pull up to sit. Standing weight was taken, 508lbs. Pt returned to bed with RW and supervision x 2. Continues to need mod-max A to stand from w/c, which is the same height as his bed at home (22"). Continues to need assist at R knee when fatigued. Pt left supine in bed with all needs in reach.   Session 2: Pt received semi-reclined in bed and agreeable to therapy. Pt sat EOB with supervision, and began to don shorts and shoes for mobility, but discovered dressing was soiled. Pt returned to bed due to soiled dressing. Nursing was notified. While awaiting nursing, pt performed the following bed level exercises x 10 BIL: SLR, Heel slides, clam shells, supine abduction, pelvic clocks. Pt then rolled into side lying for trigger point release in vastus lateralis, hamstring, piriformis, glute med/min. Nursing performed dressing change and pt was left with all needs in reach. Pt missed 10 minutes of session due to dressing change.  Therapy  Documentation Precautions:  Precautions Precautions: Fall Precaution Comments: foley catheter, large buttocks wound, R heel wound Restrictions Weight Bearing Restrictions: No General: PT Amount of Missed Time (min): 10 Minutes PT Missed Treatment Reason: Wound care    Therapy/Group: Individual Therapy  Dyane Dustman 08/29/2020, 3:50 PM

## 2020-08-29 NOTE — Plan of Care (Signed)
  Problem: RH BLADDER ELIMINATION Goal: RH STG MANAGE BLADDER WITH EQUIPMENT WITH ASSISTANCE Description: STG Manage Bladder With  foley catheter Equipment With min Assistance Outcome: Not Progressing; foley

## 2020-08-29 NOTE — Progress Notes (Signed)
Occupational Therapy Session Note  Patient Details  Name: ARMAN LOY MRN: [id number] Date of Birth: 1970-12-28  Today's Date: 08/29/2020 OT Individual Time: 1100-1155 OT Individual Time Calculation (min): 55 min    Short Term Goals: Week 4:  OT Short Term Goal 2 (Week 4): Pt will thread pants over BLE using AE PRN OT Short Term Goal 3 (Week 4): Pt will perform stand pivot transfers to [facility] with min A OT Short Term Goal 4 (Week 4): Pt will perform toileting tasks with max A  Skilled Therapeutic Interventions/Progress Updates:    Pt resting in bed upon arrival. OT intervention with focus on bed mobility, sitting balance, sit<>stand with RW, standing balance, activity tolerance, discharge planning, and safety awareness to increase independence with BADLs. Supervision for bed mobility and UB bathing/dressing. Sit<>stand with supervision with bed at normal height and elevated following back spasms. Standing balance with supervision. Pt remained in bed with all needs within reach.  Therapy Documentation Precautions:  Precautions Precautions: Fall Precaution Comments: foley catheter, large buttocks wound, R heel wound Restrictions Weight Bearing Restrictions: No  Pain:  Pt c/o increased BUE neuropathy and back spasms; Kinesio tape reapplied to back and repositioned   Therapy/Group: Individual Therapy  [name] 08/29/2020, 12:11 PM

## 2020-08-30 LAB — GLUCOSE, CAPILLARY
Glucose-Capillary: 130 mg/dL — ABNORMAL HIGH (ref 70–99)
Glucose-Capillary: 92 mg/dL (ref 70–99)
Glucose-Capillary: 150 mg/dL — ABNORMAL HIGH (ref 70–99)
Glucose-Capillary: 124 mg/dL — ABNORMAL HIGH (ref 70–99)

## 2020-08-30 NOTE — Progress Notes (Signed)
Occupational Therapy Session Note  Patient Details  Name: Carl Hill MRN: [id number] Date of Birth: 08/20/1970  Today's Date: 08/30/2020 OT Individual Time: 1400-1430 OT Individual Time Calculation (min): 30 min    Short Term Goals: Week 4:  OT Short Term Goal 2 (Week 4): Pt will thread pants over BLE using AE PRN OT Short Term Goal 3 (Week 4): Pt will perform stand pivot transfers to [facility] with min A OT Short Term Goal 4 (Week 4): Pt will perform toileting tasks with max A  Skilled Therapeutic Interventions/Progress Updates:    Pt sitting in w/c upon arrival. Handoff from PT. PT assisted with stand pivot transfers to EOB. Max A+2 for sit<>stand. Stand pivot with min A. Pt required max A for sit>supine this afternoon. Pt expressed considerable pain this afternoon. Pt required max A for repositioning in bed. BUE therex with green theraband-straight arm shoulder abduction 4x10, BUe punches 4x10. Pt remained in bed with all needs within reach. RN notified of pt's request for pain meds.  Therapy Documentation Precautions:  Precautions Precautions: Fall Precaution Comments: foley catheter, large buttocks wound, R heel wound Restrictions Weight Bearing Restrictions: No Pain:  Pt c/o 10/10 low back pain with transitional movements and repositioning in bed; RN aware   Therapy/Group: Individual Therapy  [name] 08/30/2020, 2:41 PM

## 2020-08-30 NOTE — Progress Notes (Signed)
Occupational Therapy Session Note  Patient Details  Name: RAMEY SCHIFF MRN: [id number] Date of Birth: [birth year]  Today's Date: 08/30/2020 OT Individual Time: 1100-1115 OT Individual Time Calculation (min): 15 min   Today's Date: 08/30/2020 OT Group Time: 1015-1100 OT Group Time Calculation (min): 45 min   Short Term Goals: Week 1:  OT Short Term Goal 1 (Week 1): Patient will tolerate sitting EOB for 15 minutes in preparation for BADL task OT Short Term Goal 1 - Progress (Week 1): Met OT Short Term Goal 2 (Week 1): Patient will complete sit<>stand with max A +2 in prep for transfer OT Short Term Goal 2 - Progress (Week 1): Progressing toward goal OT Short Term Goal 3 (Week 1): Patient will complete 50% of U Bbathing task while seated EOB. OT Short Term Goal 3 - Progress (Week 1): Met  Skilled Therapeutic Interventions/Progress Updates:    Pt participated in rhythmic drumming group. Pain not reported in group but stated later at end of session getting back in bed pain in buttocks. repositioned Focus of group on BUE coordination, strengthening, endurance, timing/control, activity tolerance, and social participation and engagement. Pt performs session from seated position for energy conservation. Skilled interventions included grading movements up for increased ROM and strapping LUE with coban around drumstick to keep grip d/t neuropathy. Warm up performed prior to exercises and UB stretching completed at end of group with demo from OT. Pt able to select preferred song to share with group. Returned pt to room at end of session.  Individal: pt propels w/c to room with feet resting on stool. Pt completes set up of w/c and directs care well for set up of transfer. Pt requires MIN A for stand pivot transfer back to bed using RW and rocking for momentum. Pt STS at EOB with MIN A and total A to doff shorts. Pt sup>sit with S using bed rails. A to manage catheter only.    Exited session with pt  seated in bed, exit alarm on and call light in reach  Therapy Documentation Precautions:  Precautions Precautions: Fall Precaution Comments: foley catheter, large buttocks wound, R heel wound Restrictions Weight Bearing Restrictions: No General:   Vital Signs: Therapy Vitals Pulse Rate: 94 Resp: 18 BP: 131/68 Patient Position (if appropriate): Lying Oxygen Therapy SpO2: 100 % O2 Device: Room Air Pain: Pain Assessment Pain Score: 7  ADL: ADL Eating: Set up Grooming: Minimal assistance Upper Body Bathing: Moderate assistance Lower Body Bathing: Dependent Upper Body Dressing: Maximal assistance Lower Body Dressing: Dependent Toileting: Dependent Toilet Transfer: Unable to assess Gaffer Transfer: Unable to assess Vision   Perception    Praxis   Exercises:   Other Treatments:     Therapy/Group: Individual Therapy and Group Therapy  Tonny Branch 08/30/2020, 6:55 AM

## 2020-08-30 NOTE — Progress Notes (Signed)
PROGRESS NOTE   Subjective/Complaints:  Very focused on "bumps" on R lateral hip- was told was varicose veins- said he can feel them, but admits might not be able to see them.   Was able to bridge up on regular mat-  Didn't get Vaseline on feet last night-  Is still relying a lot on RW.    ROS:   Pt denies SOB, abd pain, CP, N/V/C/D, and vision changes   Objective:   No results found. No results for input(s): WBC, HGB, HCT, PLT in the last 72 hours. No results for input(s): NA, K, CL, CO2, GLUCOSE, BUN, CREATININE, CALCIUM in the last 72 hours.  Intake/Output Summary (Last 24 hours) at 08/30/2020 1257 Last data filed at 08/30/2020 1245 Gross per 24 hour  Intake 1080 ml  Output 4300 ml  Net -3220 ml        Physical Exam: Vital Signs Blood pressure 131/68, pulse 94, temperature 97.9 F (36.6 C), temperature source Oral, resp. rate 18, height 5\' 10"  (1.778 m), weight (!) 227.6 kg, SpO2 100 %.        General: awake, alert, appropriate, in bariatric low air loss mattress,  NAD HENT: conjugate gaze; oropharynx moist CV: regular rate; no JVD Pulmonary: CTA B/L; no W/R/R- good air movement GI: soft, NT, ND, (+)BS Psychiatric: appropriate Neurological: Ox3  Skin: Warm and dry.  Post op sacral wound not examined today- R heel is callused and peeling slightly, due to dryness- no change; has some bumpiness ot skin with no actual skin color changes on R lateral hip- could be varicose veins, but usually gravity makes these more noticeable.    Vascular changes bilateral lower extremities- no change Musc: No edema in extremities.  No tenderness in extremities. Motor: Bilateral upper extremities: 5/5 throughout, unchanged Bilateral lower extremities: Hip flexion grossly 4+/5, distally 5/5, unchanged  Assessment/Plan: 1. Functional deficits which require 3+ hours per day of interdisciplinary therapy in a comprehensive  inpatient rehab setting.  Physiatrist is providing close team supervision and 24 hour management of active medical problems listed below.  Physiatrist and rehab team continue to assess barriers to discharge/monitor patient progress toward functional and medical goals  Care Tool:  Bathing    Body parts bathed by patient: Right arm,Left arm,Chest,Abdomen,Front perineal area,Right upper leg,Left upper leg,Right lower leg,Left lower leg,Face   Body parts bathed by helper: Buttocks     Bathing assist Assist Level: Minimal Assistance - Patient > 75%     Upper Body Dressing/Undressing Upper body dressing   What is the patient wearing?: Pull over shirt    Upper body assist Assist Level: Supervision/Verbal cueing    Lower Body Dressing/Undressing Lower body dressing      What is the patient wearing?: Incontinence brief     Lower body assist Assist for lower body dressing: Maximal Assistance - Patient 25 - 49%     Toileting Toileting    Toileting assist Assist for toileting: Dependent - Patient 0%     Transfers Chair/bed transfer  Transfers assist  Chair/bed transfer activity did not occur: Safety/medical concerns  Chair/bed transfer assist level: Contact Guard/Touching assist (bariwalker)     Locomotion Ambulation  Ambulation assist   Ambulation activity did not occur: Safety/medical concerns  Assist level: 2 helpers Assistive device: Walker-rolling Max distance: 60   Walk 10 feet activity   Assist  Walk 10 feet activity did not occur: Safety/medical concerns  Assist level: Contact Guard/Touching assist Assistive device: Walker-rolling   Walk 50 feet activity   Assist Walk 50 feet with 2 turns activity did not occur:  (straight path only)    Assistive device: Walker-rolling    Walk 150 feet activity   Assist Walk 150 feet activity did not occur: Safety/medical concerns         Walk 10 feet on uneven surface  activity   Assist Walk 10  feet on uneven surfaces activity did not occur: Safety/medical concerns         Wheelchair     Assist Will patient use wheelchair at discharge?: Yes Type of Wheelchair: Manual Wheelchair activity did not occur: Safety/medical concerns  Wheelchair assist level: Supervision/Verbal cueing Max wheelchair distance: 100'    Wheelchair 50 feet with 2 turns activity    Assist    Wheelchair 50 feet with 2 turns activity did not occur: Safety/medical concerns   Assist Level: Supervision/Verbal cueing   Wheelchair 150 feet activity     Assist  Wheelchair 150 feet activity did not occur: Safety/medical concerns       Blood pressure 131/68, pulse 94, temperature 97.9 F (36.6 C), temperature source Oral, resp. rate 18, height 5\' 10"  (1.778 m), weight (!) 227.6 kg, SpO2 100 %.  Medical Problem List and Plan: 1.  Debility secondary to septic shock from necrotizing soft tissue infection of perianal, gluteal, and perineal tissues.  3/28- con't PT and PT- progressing- walked 100 ft this weekend, total- see if pt can get into shower?- Will change dressing afterwards 3/29- walked 45 ft at once yesterday- doing shower today- did dry run as well- con't PT and OT  3/31- Carl Hill did muscle release on him yesterday- con't PT and OT  4/1- con't PT and OT- per pt, still putting a lot of weight through UEs on RW-  2.  Antithrombotics: -DVT/anticoagulation:  Pharmaceutical:   Xarelto             -antiplatelet therapy: N/A 3. Pain Management:  Continue Lyrica and elavil for neuropathy.              Oxycontin 15 mg bid with 10 mg prn for breakthrough pain.   On robaxin 750 mg q6 hours prn  3/29- pain controlled on current regimen- con't regimen- will need to discuss with pt, will likely need to reduce pain meds prior to d/c to get it covered by insurance.   3/30- d/w pt- will not be able to send home on three opiates- asked him to think about it.   4/1- will d/w pt again next week- con't  pain regimen for now 4. Mood: LCSW to follow for evaluation and support.              -antipsychotic agents: N/A 5. Neuropsych: This patient is capable of making decisions on his own behalf. 6. Skin/Wound Care: Air mattress for pressure relief measures.              --will need local care frequently to keep sacral wound bed clean.             --Continue foley for now.  Added hemorrhoidal cream to assist with any pain which might be ano-rectal/hemorrhoidal 7. Fluids/Electrolytes/Nutrition: Monitor I/Os.  8. Necrotizing fascitis: Has been on Augmentin since 02/11--> to continue for 6 months per ID 9. T2DM with neuropathy: Hgb A1c- 9.6 (down from 13 in Aug) Will continue to monitor BS ac/hs and use SSI for elevated BS             --used Farxiga, metformin and Victoza 1.8.             --Continue Levemir bid and titrate as indicated  -decreased novolog to 10 units TID with meals   4/1- BGs 108-142- con't regimen- doing well 10 Hyponatremia:              Sodium 138 on 3/21, labs ordered for Monday 11. Hypokalemia:    Potassium 3.8 on 3/21 12. Proximal A FIb: Monitor HR tid--continue Cardizem 240 mg and amiodarone for rate control.              --monitor for symptoms with increase in activity.  4/1- controlled- con't regimen 13. OIC:              Discontinued colace   Improved  14. Chronic right heel ulcer: Continue to paint wound daily with iodine.   3/16- is Stage II- healing  3/29- added vaseline daily- less, but still has peeling skin- asked him to scrub in shower today  3/30- pt scrubbed in shower- will con't to monitor- actual wound appears closed now.   3/31- resolved- will d/c wound care orders and place vaseline order nightly to B/L feet and cover with socks  4/1- asked nursing to put on Vaseline on feet before bed- placed order 15.  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 Will need bariatric equipment.  3/28- BMI down to 72  4/1- weight down to 227.6 kg- so down  slightly- BMI down to 72.0 form 72.4- con't regimen 16. Trach site  3/15- resolved/healed 17. Foley for urinary retention  3/23- pt wants to go home with foley- will need f/u with Urology to get out- since bladder, being a muscle, will be weak. Will need bariatric BSC if at possible.  3/31- pt foley is required due to wounds and urinary retention- con't foley and monitor- con't [facility] baths  18. Muscle spasms  See #3  3/29- doing better on Robaxin- con't regimen  19. Disposition:   At baseline, patient received significant assistance from his mother, aged 88. He required her assistance to wipe stool after BM due to body habitus. He was able to ambulate to bathroom and back on his own. Plan is for discharge home to father, who is a IT sales professional, and who can provide more physical support to patient. Goal is ModA level for ambulation within his room.   3/22- pt insists going home with mother- says she can do wound care better- will go to fathers when mother has surgery.    3/23- mother's surgery on 4/29  3/30- moved d/c date to 4/12 per therapy.    LOS: 24 days A FACE TO FACE EVALUATION WAS PERFORMED  Carl Hill 08/30/2020, 12:57 PM

## 2020-08-30 NOTE — Progress Notes (Signed)
Physical Therapy Session Note  Patient Details  Name: Carl Hill MRN: [id number] Date of Birth: Apr 02, 1971  Today's Date: 08/30/2020 PT Individual Time: 0900-1000 PT Individual Time Calculation (min): 60 min   Short Term Goals: Week 1:  PT Short Term Goal 1 (Week 1): Pt will tolerate sitting EOB with supervision x 10 minutes while completing a functional task PT Short Term Goal 1 - Progress (Week 1): Met PT Short Term Goal 2 (Week 1): Pt will perform STS with min A x 2 consistently PT Short Term Goal 2 - Progress (Week 1): Met PT Short Term Goal 3 (Week 1): Pt will initiate gait training. PT Short Term Goal 3 - Progress (Week 1): Met Week 2:  PT Short Term Goal 1 (Week 2): Pt will initiate gait training with RW. PT Short Term Goal 1 - Progress (Week 2): Met PT Short Term Goal 2 (Week 2): Pt will stand with RW x 2 minutes with min A x 2. PT Short Term Goal 2 - Progress (Week 2): Progressing toward goal PT Short Term Goal 3 (Week 2): Pt will tolerate >20 min of activity without incidence of pain. PT Short Term Goal 3 - Progress (Week 2): Progressing toward goal Week 3:  PT Short Term Goal 1 (Week 3): Pt will stand with RW x 2 minutes with min A x 2. PT Short Term Goal 1 - Progress (Week 3): Met PT Short Term Goal 2 (Week 3): Pt will tolerate >20 min of activity without incidence of pain. PT Short Term Goal 2 - Progress (Week 3): Progressing toward goal PT Short Term Goal 3 (Week 3): Pt will perform gait x 50 with RW and min A of 2 PT Short Term Goal 3 - Progress (Week 3): Met  Skilled Therapeutic Interventions/Progress Updates:    Pain:  Pt reports  Bottom and back 8-9/10 pain. Pt received pain meds prior to session. Treatment to tolerance.  Rest breaks and repositioning as needed.  Pt initially supine and agreeable to treatment session w/focus on gait endurance, ADLs, strengthening. Pt supine to sit w/supervision using bed features. Dons shorts w/set up and min assist in sitting  and standing, shoes w/max assist. Sit to stand to RW w/cga from bed. stand pivot transfer to wc w/cga. In sitting therapist assisted w/donning neoprene sleeve Propels wc w/bilat LEs x 103f Gait total distand [id number]/seated rest at [id number] RW w/wc follow. Performed for power training to improve LE strength/stbility: Repeated Sit to stand from wc x 2, rest                                                   X2, rest                                                   x1 recover *requires cues for breathing, exhalation on max effort. At end of session, pt positioned comfortably and provided w/beverage.  Left in Day Room in prep for group OT session, handed off to PT in room if assist needed in interim. Therapy Documentation Precautions:  Precautions Precautions: Fall Precaution Comments: foley catheter, large buttocks wound, R heel wound Restrictions Weight Bearing Restrictions:  No   Therapy/Group: Individual Therapy  Callie Fielding, [place] 08/30/2020, 10:18 AM

## 2020-08-30 NOTE — Progress Notes (Signed)
Physical Therapy Session Note  Patient Details  Name: Carl Hill MRN: [id number] Date of Birth: [birth year]  Today's Date: 08/30/2020 PT Individual Time: 9357-0177 PT Individual Time Calculation (min): 53 min   Short Term Goals: Week 4:  PT Short Term Goal 1 (Week 4): Pt will ambulate x 100 ft with RW and CGA x 2 PT Short Term Goal 2 (Week 4): Pt will maintain dynamic standing balance x 2 min with LRAD and CGA x 2. PT Short Term Goal 3 (Week 4): Pt will self propel w/c with supervision x 300 ft.  Skilled Therapeutic Interventions/Progress Updates:     Pt received supine in bed and agrees to therapy. Supine tos it with supervision and heavy use of bed rails. Pt reports significant pain in lower back and requires increased time sitting at EOB to manage pain. Pt then performs sit to stand from elevated bed height with minA and bariwalker. Stand step transfer to [facility] with minA. WC transport outside for time management. PT provides cues for pt posture and body mechanics to improve safety with sitting balance. Pt performs sit to stand transfer from [facility] with minA/CGA, and maintains standing x1 minute. WC transport back inside. Pt attempt sit to stand several times but is unable to complete. OT available for +2 assistance and with modA +2, and blocking of R knee, pt is able to complete sit to stand and stand step transfer back to bed. Pt left with OT, sitting at EOB.  Therapy Documentation Precautions:  Precautions Precautions: Fall Precaution Comments: foley catheter, large buttocks wound, R heel wound Restrictions Weight Bearing Restrictions: No  Therapy/Group: Individual Therapy  Beau Fanny, PT, DPT 08/30/2020, 4:23 PM

## 2020-08-31 DIAGNOSIS — K5901 Slow transit constipation: Secondary | ICD-10-CM

## 2020-08-31 LAB — GLUCOSE, CAPILLARY
Glucose-Capillary: 141 mg/dL — ABNORMAL HIGH (ref 70–99)
Glucose-Capillary: 111 mg/dL — ABNORMAL HIGH (ref 70–99)
Glucose-Capillary: 226 mg/dL — ABNORMAL HIGH (ref 70–99)
Glucose-Capillary: 85 mg/dL (ref 70–99)

## 2020-08-31 MED ORDER — MORPHINE SULFATE ER 15 MG PO TBCR
15.0000 mg | EXTENDED_RELEASE_TABLET | Freq: Two times a day (BID) | ORAL | Status: DC
Start: 2020-08-31 — End: 2020-09-12
  Administered 2020-08-31 – 2020-09-12 (×24): 15 mg via ORAL
  Filled 2020-08-31 (×24): qty 1

## 2020-08-31 MED ORDER — SORBITOL 70 % SOLN
60.0000 mL | Freq: Once | Status: AC
  Administered 2020-08-31: 60 mL via ORAL
  Filled 2020-08-31: qty 60

## 2020-08-31 NOTE — Progress Notes (Signed)
PROGRESS NOTE   Subjective/Complaints:  Patient reports he is having a little stool each day; however no large stools.  Feels he is getting constipated again. Notes that he is 501 pounds as of this morning; he weighed 550 pounds at home prior to admission.  He was weighing over 600 pounds with the water weight in the acute part of the hospital. He was started on a K pad last night for low back pain with improvement of pain.  We discussed changing his OxyContin to MS Contin to improve the chances of his insurance covering it-he has never had any problems with morphine.  He is still deciding if he wants Dilaudid or oxycodone at home, since I cannot send him home on both.  His mother is at bedside and wanted to discuss multiple issues about discharge as well as trying to get him to sit on the edge of the bed to eat on top of moving his medications so they are not all in the morning.  I spent 30 minutes at bedside discussing issues with patient as discussed above.  ROS:   Pt denies SOB, abd pain, CP, N/V/C/D, and vision changes   Objective:   No results found. No results for input(s): WBC, HGB, HCT, PLT in the last 72 hours. No results for input(s): NA, K, CL, CO2, GLUCOSE, BUN, CREATININE, CALCIUM in the last 72 hours.  Intake/Output Summary (Last 24 hours) at 08/31/2020 1738 Last data filed at 08/31/2020 1300 Gross per 24 hour  Intake 680 ml  Output 7550 ml  Net -6870 ml        Physical Exam: Vital Signs Blood pressure (!) 112/51, pulse 95, temperature 98.5 F (36.9 C), temperature source Oral, resp. rate 16, height 5\' 10"  (1.778 m), weight (!) 227.6 kg, SpO2 97 %.         General: awake, alert, appropriate, laying in bed in low air loss bariatric mattress ; mother at bedside ; NAD HENT: conjugate gaze; oropharynx moist CV: regular rate; no JVD Pulmonary: CTA B/L; no W/R/R- good air movement GI: soft, NT, ND,  (+)BS Psychiatric: appropriate; very interactive today; trying to figure out what is going on for discharge Skin: -Sacral postop wound is more superficial;  the edges have contracted slightly so it is a little smaller and it is more pink as compared to beefy red.   Vascular changes bilateral lower extremities- no change Musc: No edema in extremities.  No tenderness in extremities. Motor: Bilateral upper extremities: 5/5 throughout, unchanged Bilateral lower extremities: Hip flexion grossly 4+/5, distally 5/5, unchanged  Assessment/Plan: 1. Functional deficits which require 3+ hours per day of interdisciplinary therapy in a comprehensive inpatient rehab setting.  Physiatrist is providing close team supervision and 24 hour management of active medical problems listed below.  Physiatrist and rehab team continue to assess barriers to discharge/monitor patient progress toward functional and medical goals  Care Tool:  Bathing    Body parts bathed by patient: Right arm,Left arm,Chest,Abdomen,Front perineal area,Right upper leg,Left upper leg,Right lower leg,Left lower leg,Face   Body parts bathed by helper: Buttocks     Bathing assist Assist Level: Minimal Assistance - Patient > 75%  Upper Body Dressing/Undressing Upper body dressing   What is the patient wearing?: Pull over shirt    Upper body assist Assist Level: Supervision/Verbal cueing    Lower Body Dressing/Undressing Lower body dressing      What is the patient wearing?: Incontinence brief     Lower body assist Assist for lower body dressing: Maximal Assistance - Patient 25 - 49%     Toileting Toileting    Toileting assist Assist for toileting: Dependent - Patient 0%     Transfers Chair/bed transfer  Transfers assist  Chair/bed transfer activity did not occur: Safety/medical concerns  Chair/bed transfer assist level: Minimal Assistance - Patient > 75%     Locomotion Ambulation   Ambulation assist    Ambulation activity did not occur: Safety/medical concerns  Assist level: 2 helpers Assistive device: Walker-rolling Max distance: 60   Walk 10 feet activity   Assist  Walk 10 feet activity did not occur: Safety/medical concerns  Assist level: Contact Guard/Touching assist Assistive device: Walker-rolling   Walk 50 feet activity   Assist Walk 50 feet with 2 turns activity did not occur:  (straight path only)    Assistive device: Walker-rolling    Walk 150 feet activity   Assist Walk 150 feet activity did not occur: Safety/medical concerns         Walk 10 feet on uneven surface  activity   Assist Walk 10 feet on uneven surfaces activity did not occur: Safety/medical concerns         Wheelchair     Assist Will patient use wheelchair at discharge?: Yes Type of Wheelchair: Manual Wheelchair activity did not occur: Safety/medical concerns  Wheelchair assist level: Supervision/Verbal cueing Max wheelchair distance: 100'    Wheelchair 50 feet with 2 turns activity    Assist    Wheelchair 50 feet with 2 turns activity did not occur: Safety/medical concerns   Assist Level: Supervision/Verbal cueing   Wheelchair 150 feet activity     Assist  Wheelchair 150 feet activity did not occur: Safety/medical concerns       Blood pressure (!) 112/51, pulse 95, temperature 98.5 F (36.9 C), temperature source Oral, resp. rate 16, height 5\' 10"  (1.778 m), weight (!) 227.6 kg, SpO2 97 %.  Medical Problem List and Plan: 1.  Debility secondary to septic shock from necrotizing soft tissue infection of perianal, gluteal, and perineal tissues.  3/28- con't PT and PT- progressing- walked 100 ft this weekend, total- see if pt can get into shower?- Will change dressing afterwards 3/29- walked 45 ft at once yesterday- doing shower today- did dry run as well- con't PT and OT  -4/2- con't PT and OT- will see if can sit him up at EOB to eat- but cannot address utnil  I speak with 4/29 2.  Antithrombotics: -DVT/anticoagulation:  Pharmaceutical:   Xarelto             -antiplatelet therapy: N/A 3. Pain Management:  Continue Lyrica and elavil for neuropathy.              Oxycontin 15 mg bid with 10 mg prn for breakthrough pain.   On robaxin 750 mg q6 hours prn  3/29- pain controlled on current regimen- con't regimen- will need to discuss with pt, will likely need to reduce pain meds prior to d/c to get it covered by insurance.   3/30- d/w pt- will not be able to send home on three opiates- asked him to think about it.  4/1- will d/w pt again next week- con't pain regimen for now 4/2-discussed at length oxycodone versus Dilaudid for home regimen; will change  his OxyContin to MS Contin 15 mg twice a day; will wait on changing the Dilaudid versus oxycodone. Also added kpad for pain.  4. Mood: LCSW to follow for evaluation and support.              -antipsychotic agents: N/A 5. Neuropsych: This patient is capable of making decisions on his own behalf. 6. Skin/Wound Care: Air mattress for pressure relief measures.              --will need local care frequently to keep sacral wound bed clean.             --Continue foley for now.  Added hemorrhoidal cream to assist with any pain which might be ano-rectal/hemorrhoidal  4/2-postop wound is healing; continue regimen 7. Fluids/Electrolytes/Nutrition: Monitor I/Os.  8. Necrotizing fascitis: Has been on Augmentin since 02/11--> to continue for 6 months per ID 9. T2DM with neuropathy: Hgb A1c- 9.6 (down from 13 in Aug) Will continue to monitor BS ac/hs and use SSI for elevated BS             --used Farxiga, metformin and Victoza 1.8.             --Continue Levemir bid and titrate as indicated  -decreased novolog to 10 units TID with meals   4/2-blood sugars well controlled; con't regimen 10 Hyponatremia:              Sodium 138 on 3/21, labs ordered for Monday 11. Hypokalemia:    Potassium 3.8 on  3/21 12. Proximal A FIb: Monitor HR tid--continue Cardizem 240 mg and amiodarone for rate control.              --monitor for symptoms with increase in activity.  4/1- controlled- con't regimen 13. OIC:              Discontinued colace  4/2- will Give Sorbitol 60 ml and see if will poop- if not, will need to do Mg citrate again.    Improved  14. Chronic right heel ulcer: Continue to paint wound daily with iodine.   3/16- is Stage II- healing  3/29- added vaseline daily- less, but still has peeling skin- asked him to scrub in shower today  3/30- pt scrubbed in shower- will con't to monitor- actual wound appears closed now.   3/31- resolved- will d/c wound care orders and place vaseline order nightly to B/L feet and cover with socks  4/1- asked nursing to put on Vaseline on feet before bed- placed order 15.  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 Will need bariatric equipment.  3/28- BMI down to 72  4/1- weight down to 227.6 kg- so down slightly- BMI down to 72.0 form 72.4- con't regimen\  4/2-patient's goal is to be 499 pounds by discharge-he really wants to go home on his current diabetic regimen and he actually wants to go home eating the meals he has eaten in the hospital.  Encouraged this. 16. Trach site  3/15- resolved/healed 17. Foley for urinary retention  3/23- pt wants to go home with foley- will need f/u with Urology to get out- since bladder, being a muscle, will be weak. Will need bariatric BSC if at possible.  3/31- pt foley is required due to wounds and urinary retention- con't foley and monitor- con't [facility] baths   4/2-explained if patient  cannot get home health, he will need to go to the urologist to get the Foley changed once a month. 18. Muscle spasms  See #3  3/29- doing better on Robaxin- con't regimen  4/2-mother feels that patient is back should be imaged with an MRI due to muscle spasms-I explained the muscle spasms are common when someone is working  harder than they have for a while.  Also it is not midline back pain; do not think an MRI is necessary at this time.  19. Disposition:   At baseline, patient received significant assistance from his mother, aged 49. He required her assistance to wipe stool after BM due to body habitus. He was able to ambulate to bathroom and back on his own. Plan is for discharge home to father, who is a IT sales professionalfirefighter, and who can provide more physical support to patient. Goal is ModA level for ambulation within his room.   3/22- pt insists going home with mother- says she can do wound care better- will go to fathers when mother has surgery.    3/23- mother's surgery on 4/29  3/30- moved d/c date to 4/12 per therapy.   I spent 35 minutes total care on this patient today-more than 50% of this time was spent in coordination of care and educating mother and patient; specifically about pain medicine changing his pain medicine as above, discussing constipation, discussing changing his medicines except for pain medicines to be more even throughout the day and discussing discharge.  LOS: 25 days A FACE TO FACE EVALUATION WAS PERFORMED  Carl Hill 08/31/2020, 5:38 PM

## 2020-08-31 NOTE — Progress Notes (Signed)
Occupational Therapy Session Note  Patient Details  Name: Carl Hill MRN: [id number] Date of Birth: December 02, 1970  Today's Date: 08/31/2020 OT Individual Time: 6283-1517 OT Individual Time Calculation (min): 33 min  27 minutes missed  Skilled Therapeutic Interventions/Progress Updates:    Pt greeted while sitting on the BSC, straining and in visible pain while trying to have a BM. RN present. OT warmed up 2 cups of prune juice for pt and educated him on potty-squat positioning + rocking techniques to promote void. Pt with large BM. His mother stabilized the RW while OT stabilized the [facility] when he stood to readjust himself and when OT assisted with perihygiene. Due to exposed wounds, nursing opted to finish with perihygiene in bed. CGA of 2 for stand pivot<bed using RW due to pts heightened state of anxiety. He returned to bed with +2 assist and was then left in care of nursing for dressing necrotizing wounds on buttocks. OT provided pt with lavender aromatherapy to therapeutically address feelings of anxiety. Pt appreciative. Time missed due to nursing care + severe abdominal pain.   Therapy Documentation Precautions:  Precautions Precautions: Fall Precaution Comments: foley catheter, large buttocks wound, R heel wound Restrictions Weight Bearing Restrictions: No Vital Signs: Therapy Vitals Temp: 98.5 F (36.9 C) Temp Source: Oral Pulse Rate: 95 Resp: 16 BP: (!) 112/51 Patient Position (if appropriate): Lying Oxygen Therapy SpO2: 97 % O2 Device: Room Air ADL: ADL Eating: Set up Grooming: Minimal assistance Upper Body Bathing: Moderate assistance Lower Body Bathing: Dependent Upper Body Dressing: Maximal assistance Lower Body Dressing: Dependent Toileting: Dependent Toilet Transfer: Unable to assess Walk-In Shower Transfer: Unable to assess      Therapy/Group: Individual Therapy  Cydnie Deason A Odester Nilson 08/31/2020, 3:45 PM

## 2020-08-31 NOTE — Progress Notes (Signed)
Continue medical regime and cooperative, c/o pain to buttocks area and medicated with schedule and prn medications, rate pain at 9/10 with reassessment of pain score 8/10.Marland KitchenProvided wound care  per orders premedication with Dilaudid. Robaxin administered for back and muscle  Spasm.Foley and CHG bath completed. Monitored and assisted

## 2020-09-01 LAB — GLUCOSE, CAPILLARY
Glucose-Capillary: 128 mg/dL — ABNORMAL HIGH (ref 70–99)
Glucose-Capillary: 103 mg/dL — ABNORMAL HIGH (ref 70–99)
Glucose-Capillary: 121 mg/dL — ABNORMAL HIGH (ref 70–99)
Glucose-Capillary: 149 mg/dL — ABNORMAL HIGH (ref 70–99)

## 2020-09-01 MED ORDER — POLYETHYLENE GLYCOL 3350 17 G PO PACK
17.0000 g | PACK | Freq: Every day | ORAL | Status: DC
  Administered 2020-09-01 – 2020-09-12 (×11): 17 g via ORAL
  Filled 2020-09-01 (×12): qty 1

## 2020-09-01 NOTE — Progress Notes (Signed)
PROGRESS NOTE   Subjective/Complaints:  Patient reports his butt hurts- I.e. his anus-due to multiple bowel movements yesterday.  He notes that his anus got irritated, and is burning.  He also notes he is still receiving the hemorrhoid cream. He had his bowel movements yesterday on the bedside commode. He is also passing gas every few minutes today.  He has required to be cleaned out 2 separate times in the last week and a half; we have gone back and forth  with increasing and decreasing his bowel medications-at this time will restart his MiraLAX scheduled  ROS:   Pt denies SOB, abd pain, CP, N/V/C/D, and vision changes   Objective:   No results found. No results for input(s): WBC, HGB, HCT, PLT in the last 72 hours. No results for input(s): NA, K, CL, CO2, GLUCOSE, BUN, CREATININE, CALCIUM in the last 72 hours.  Intake/Output Summary (Last 24 hours) at 09/01/2020 1545 Last data filed at 09/01/2020 1300 Gross per 24 hour  Intake 680 ml  Output 5250 ml  Net -4570 ml        Physical Exam: Vital Signs Blood pressure 133/63, pulse 91, temperature 97.6 F (36.4 C), temperature source Oral, resp. rate 18, height 5\' 10"  (1.778 m), weight (!) 227.6 kg, SpO2 97 %.          General: awake, alert, appropriate, laying in bariatric low-air-loss bed ;NAD HENT: conjugate gaze; oropharynx moist CV: regular rate; no JVD Pulmonary: CTA B/L; no W/R/R- good air movement GI: soft, NT, ND, (+)BS Psychiatric: appropriate; complaining of anus pain Neurological: Ox3; appropriate  Skin: -Sacral postop wound is more superficial;  the edges have contracted slightly so it is a little smaller and it is more pink as compared to beefy red.   Vascular changes bilateral lower extremities- no change Musc: No edema in extremities.  No tenderness in extremities. Motor: Bilateral upper extremities: 5/5 throughout, unchanged Bilateral lower  extremities: Hip flexion grossly 4+/5, distally 5/5, unchanged  Assessment/Plan: 1. Functional deficits which require 3+ hours per day of interdisciplinary therapy in a comprehensive inpatient rehab setting.  Physiatrist is providing close team supervision and 24 hour management of active medical problems listed below.  Physiatrist and rehab team continue to assess barriers to discharge/monitor patient progress toward functional and medical goals  Care Tool:  Bathing    Body parts bathed by patient: Right arm,Left arm,Chest,Abdomen,Front perineal area,Right upper leg,Left upper leg,Right lower leg,Left lower leg,Face   Body parts bathed by helper: Buttocks     Bathing assist Assist Level: Minimal Assistance - Patient > 75%     Upper Body Dressing/Undressing Upper body dressing   What is the patient wearing?: Pull over shirt    Upper body assist Assist Level: Supervision/Verbal cueing    Lower Body Dressing/Undressing Lower body dressing      What is the patient wearing?: Incontinence brief     Lower body assist Assist for lower body dressing: Maximal Assistance - Patient 25 - 49%     Toileting Toileting    Toileting assist Assist for toileting: Dependent - Patient 0%     Transfers Chair/bed transfer  Transfers assist  Chair/bed transfer activity did  not occur: Safety/medical concerns  Chair/bed transfer assist level: Minimal Assistance - Patient > 75%     Locomotion Ambulation   Ambulation assist   Ambulation activity did not occur: Safety/medical concerns  Assist level: 2 helpers Assistive device: Walker-rolling Max distance: 60   Walk 10 feet activity   Assist  Walk 10 feet activity did not occur: Safety/medical concerns  Assist level: Contact Guard/Touching assist Assistive device: Walker-rolling   Walk 50 feet activity   Assist Walk 50 feet with 2 turns activity did not occur:  (straight path only)    Assistive device: Walker-rolling     Walk 150 feet activity   Assist Walk 150 feet activity did not occur: Safety/medical concerns         Walk 10 feet on uneven surface  activity   Assist Walk 10 feet on uneven surfaces activity did not occur: Safety/medical concerns         Wheelchair     Assist Will patient use wheelchair at discharge?: Yes Type of Wheelchair: Manual Wheelchair activity did not occur: Safety/medical concerns  Wheelchair assist level: Supervision/Verbal cueing Max wheelchair distance: 100'    Wheelchair 50 feet with 2 turns activity    Assist    Wheelchair 50 feet with 2 turns activity did not occur: Safety/medical concerns   Assist Level: Supervision/Verbal cueing   Wheelchair 150 feet activity     Assist  Wheelchair 150 feet activity did not occur: Safety/medical concerns       Blood pressure 133/63, pulse 91, temperature 97.6 F (36.4 C), temperature source Oral, resp. rate 18, height 5\' 10"  (1.778 m), weight (!) 227.6 kg, SpO2 97 %.  Medical Problem List and Plan: 1.  Debility secondary to septic shock from necrotizing soft tissue infection of perianal, gluteal, and perineal tissues.  3/28- con't PT and PT- progressing- walked 100 ft this weekend, total- see if pt can get into shower?- Will change dressing afterwards 3/29- walked 45 ft at once yesterday- doing shower today- did dry run as well- con't PT and OT  -4/2- con't PT and OT- will see if can sit him up at EOB to eat- but cannot address utnil I speak with Cheri GuppyJennifer Bailey  4/3-continue PT and OT 2.  Antithrombotics: -DVT/anticoagulation:  Pharmaceutical:   Xarelto             -antiplatelet therapy: N/A 3. Pain Management:  Continue Lyrica and elavil for neuropathy.              Oxycontin 15 mg bid with 10 mg prn for breakthrough pain.   On robaxin 750 mg q6 hours prn  3/29- pain controlled on current regimen- con't regimen- will need to discuss with pt, will likely need to reduce pain meds prior to  d/c to get it covered by insurance.   3/30- d/w pt- will not be able to send home on three opiates- asked him to think about it.   4/1- will d/w pt again next week- con't pain regimen for now 4/2-discussed at length oxycodone versus Dilaudid for home regimen; will change  his OxyContin to MS Contin 15 mg twice a day; will wait on changing the Dilaudid versus oxycodone. Also added kpad for pain.   4/3-patient did not report any change in his overall pain with transition to MS Contin; continue regimen 4. Mood: LCSW to follow for evaluation and support.              -antipsychotic agents: N/A 5. Neuropsych: This patient  is capable of making decisions on his own behalf. 6. Skin/Wound Care: Air mattress for pressure relief measures.              --will need local care frequently to keep sacral wound bed clean.             --Continue foley for now.  Added hemorrhoidal cream to assist with any pain which might be ano-rectal/hemorrhoidal  4/2-postop wound is healing; continue regimen  4/3-still requires the hemorrhoidal cream for anal pain 7. Fluids/Electrolytes/Nutrition: Monitor I/Os.  8. Necrotizing fascitis: Has been on Augmentin since 02/11--> to continue for 6 months per ID 9. T2DM with neuropathy: Hgb A1c- 9.6 (down from 13 in Aug) Will continue to monitor BS ac/hs and use SSI for elevated BS             --used Farxiga, metformin and Victoza 1.8.             --Continue Levemir bid and titrate as indicated  -decreased novolog to 10 units TID with meals   4/2-blood sugars well controlled; con't regimen  4/3-had an episode of a blood sugar of 226 yesterday; otherwise his blood sugars are running from 85- 130- con't regimen- that elevation is rare for him.  10 Hyponatremia:              Sodium 138 on 3/21, labs ordered for Monday 11. Hypokalemia:    Potassium 3.8 on 3/21  4/3-we will recheck his labs in the morning 12. Proximal A FIb: Monitor HR tid--continue Cardizem 240 mg and amiodarone for  rate control.              --monitor for symptoms with increase in activity.  4/1- controlled- con't regimen 13. OIC:              Discontinued colace  4/2- will Give Sorbitol 60 ml and see if will poop- if not, will need to do Mg citrate again.    4/3-had multiple bowel movements yesterday-will add MiraLAX back scheduled  Improved  14. Chronic right heel ulcer: Continue to paint wound daily with iodine.   3/16- is Stage II- healing  3/29- added vaseline daily- less, but still has peeling skin- asked him to scrub in shower today  3/30- pt scrubbed in shower- will con't to monitor- actual wound appears closed now.   3/31- resolved- will d/c wound care orders and place vaseline order nightly to B/L feet and cover with socks  4/1- asked nursing to put on Vaseline on feet before bed- placed order 15.  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 Will need bariatric equipment.  3/28- BMI down to 72  4/1- weight down to 227.6 kg- so down slightly- BMI down to 72.0 form 72.4- con't regimen\  4/2-patient's goal is to be 499 pounds by discharge-he really wants to go home on his current diabetic regimen and he actually wants to go home eating the meals he has eaten in the hospital.  Encouraged this. 16. Trach site  3/15- resolved/healed 17. Foley for urinary retention  3/23- pt wants to go home with foley- will need f/u with Urology to get out- since bladder, being a muscle, will be weak. Will need bariatric BSC if at possible.  3/31- pt foley is required due to wounds and urinary retention- con't foley and monitor- con't [facility] baths   4/2-explained if patient cannot get home health, he will need to go to the urologist to get the Foley changed once a month.  18. Muscle spasms  See #3  3/29- doing better on Robaxin- con't regimen  4/2-mother feels that patient is back should be imaged with an MRI due to muscle spasms-I explained the muscle spasms are common when someone is working harder than  they have for a while.  Also it is not midline back pain; do not think an MRI is necessary at this time.  19. Disposition:   At baseline, patient received significant assistance from his mother, aged 30. He required her assistance to wipe stool after BM due to body habitus. He was able to ambulate to bathroom and back on his own. Plan is for discharge home to father, who is a IT sales professional, and who can provide more physical support to patient. Goal is ModA level for ambulation within his room.   3/22- pt insists going home with mother- says she can do wound care better- will go to fathers when mother has surgery.    3/23- mother's surgery on 4/29  3/30- moved d/c date to 4/12 per therapy.    LOS: 26 days A FACE TO FACE EVALUATION WAS PERFORMED  Ica Daye 09/01/2020, 3:45 PM

## 2020-09-02 ENCOUNTER — Inpatient Hospital Stay (HOSPITAL_COMMUNITY): Payer: Medicaid Other

## 2020-09-02 LAB — BASIC METABOLIC PANEL
Anion gap: 8 (ref 5–15)
BUN: 7 mg/dL (ref 6–20)
CO2: 29 mmol/L (ref 22–32)
Calcium: 9.1 mg/dL (ref 8.9–10.3)
Chloride: 103 mmol/L (ref 98–111)
Creatinine, Ser: 0.89 mg/dL (ref 0.61–1.24)
GFR, Estimated: >60 mL/min (ref >60)
Glucose, Bld: 132 mg/dL — ABNORMAL HIGH (ref 70–99)
Potassium: 3.7 mmol/L (ref 3.5–5.1)
Sodium: 140 mmol/L (ref 135–145)

## 2020-09-02 LAB — CBC WITH DIFFERENTIAL/PLATELET
Abs Immature Granulocytes: 0.02 10*3/uL (ref 0.00–0.07)
Basophils Absolute: 0.1 10*3/uL (ref 0.0–0.1)
Basophils Relative: 2 %
Eosinophils Absolute: 0.2 10*3/uL (ref 0.0–0.5)
Eosinophils Relative: 3 %
HCT: 37 % — ABNORMAL LOW (ref 39.0–52.0)
Hemoglobin: 11.1 g/dL — ABNORMAL LOW (ref 13.0–17.0)
Immature Granulocytes: 0 %
Lymphocytes Relative: 31 %
Lymphs Abs: 2.1 10*3/uL (ref 0.7–4.0)
MCH: 25.1 pg — ABNORMAL LOW (ref 26.0–34.0)
MCHC: 30 g/dL (ref 30.0–36.0)
MCV: 83.7 fL (ref 80.0–100.0)
Monocytes Absolute: 0.5 10*3/uL (ref 0.1–1.0)
Monocytes Relative: 7 %
Neutro Abs: 3.9 10*3/uL (ref 1.7–7.7)
Neutrophils Relative %: 57 %
Platelets: 252 10*3/uL (ref 150–400)
RBC: 4.42 MIL/uL (ref 4.22–5.81)
RDW: 16.7 % — ABNORMAL HIGH (ref 11.5–15.5)
WBC: 6.8 10*3/uL (ref 4.0–10.5)
nRBC: 0 % (ref 0.0–0.2)

## 2020-09-02 LAB — GLUCOSE, CAPILLARY
Glucose-Capillary: 128 mg/dL — ABNORMAL HIGH (ref 70–99)
Glucose-Capillary: 122 mg/dL — ABNORMAL HIGH (ref 70–99)
Glucose-Capillary: 117 mg/dL — ABNORMAL HIGH (ref 70–99)
Glucose-Capillary: 133 mg/dL — ABNORMAL HIGH (ref 70–99)

## 2020-09-02 MED ORDER — SORBITOL 70 % SOLN
960.0000 mL | TOPICAL_OIL | Freq: Once | ORAL | Status: AC
  Administered 2020-09-02: 960 mL via RECTAL
  Filled 2020-09-02: qty 473

## 2020-09-02 MED ORDER — WITCH HAZEL-GLYCERIN EX PADS
MEDICATED_PAD | CUTANEOUS | Status: DC | PRN
  Filled 2020-09-02 (×2): qty 100

## 2020-09-02 NOTE — Progress Notes (Signed)
KUB shows persistent stool with question of impaction. Patient complaining of hemorrhoidal pain and with nurse present-->rectal exam showed moist intergluteal cleft with copious mucoid drainage with feces along anal verge.  Wound bed clean but does have some bogginess right gluteal cleft.  Rectal vault with mushy stool and reported ongoing intermittent rectal pain likely due to rectal spasms. He's frustrated with the mixed messages he feels that he has been getting.  Discussed rational of bowel program-->stool burden has not really decreased and now with impaction, need oral as well as rectal intervention (maybe more than once) to help with evacuation and that may take a few days. He is walking now and gravity will help. SMOG enema ordered.

## 2020-09-02 NOTE — Progress Notes (Signed)
Assisting Rella Larve, RN with patient. Patient yelling in pain, C/O "burning" pain to rectal area. Large hemorrhoids. Spoke with Patient's mom, she's wanting Dr. Romie Levee consulted to address rectal pain situation. Multi meds given by Rella Larve to address situation. Slept good after melatonin and tylenol given at 2341. Alfredo Martinez A

## 2020-09-02 NOTE — Progress Notes (Signed)
Occupational Therapy Note  Patient Details  Name: KAMAL JURGENS MRN: [id number] Date of Birth: June 09, 1970  Today's Date: 09/02/2020 OT Missed Time: 60 Minutes Missed Time Reason: Pain;Nursing care  Pt laying in bed on side with no wound dressing. Pt in considerable pain 2/2 hemorrhoids. Pt unable to participate in therapy 2/2 pain. Will attempt later.    Lavone Neri [facility] 09/02/2020, 10:52 AM

## 2020-09-02 NOTE — Progress Notes (Signed)
Pt stated that his pain was elevated from yesterday as 10/10 and that he feels like he has to have BM. Mucous stool noted coming from rectum. Pt refuses dressing change at this time due to having frequent loose stools. PA pam notified of pt status and elevated pain level.

## 2020-09-02 NOTE — Progress Notes (Signed)
Physical Therapy Note  Patient Details  Name: Carl Hill MRN: [id number] Date of Birth: 16-Jul-1970 Today's Date: 09/02/2020    Pt missed two session for total of 120 min due to issues w/pain, nursing care, wound. Carl Hill, PT    Shearon Balo 09/02/2020, 1:59 PM

## 2020-09-03 ENCOUNTER — Inpatient Hospital Stay (HOSPITAL_COMMUNITY): Payer: Medicaid Other

## 2020-09-03 LAB — GLUCOSE, CAPILLARY
Glucose-Capillary: 141 mg/dL — ABNORMAL HIGH (ref 70–99)
Glucose-Capillary: 114 mg/dL — ABNORMAL HIGH (ref 70–99)
Glucose-Capillary: 146 mg/dL — ABNORMAL HIGH (ref 70–99)
Glucose-Capillary: 125 mg/dL — ABNORMAL HIGH (ref 70–99)

## 2020-09-03 NOTE — Progress Notes (Signed)
Physical Therapy Session Note  Patient Details  Name: SULAIMAN IMBERT MRN: [id number] Date of Birth: 1971/03/19  Today's Date: 09/03/2020 PT Missed Time: 60 Minutes Missed Time Reason: Other (Comment);Pain (wound not able to be dressed)  Short Term Goals: Week 4:  PT Short Term Goal 1 (Week 4): Pt will ambulate x 100 ft with RW and CGA x 2 PT Short Term Goal 2 (Week 4): Pt will maintain dynamic standing balance x 2 min with LRAD and CGA x 2. PT Short Term Goal 3 (Week 4): Pt will self propel w/c with supervision x 300 ft.  Skilled Therapeutic Interventions/Progress Updates:    Patient unable to participate in PT at this time- wound being unable to be dressed due to continuous bowel movements. He also reports 9/10. Patient remaining in bed.   Therapy Documentation Precautions:  Precautions Precautions: Fall Precaution Comments: foley catheter, large buttocks wound, R heel wound Restrictions Weight Bearing Restrictions: No    Therapy/Group: Individual Therapy  Elizebeth Koller, PT, DPT, CBIS  09/03/2020, 7:42 AM

## 2020-09-03 NOTE — Patient Care Conference (Signed)
Inpatient RehabilitationTeam Conference and Plan of Care Update Date: 09/03/2020   Time: 11:15 AM    Patient Name: Carl Hill      Medical Record Number: [id number]  Date of Birth: [birth year] Sex: Male         Room/Bed: 4W14C/4W14C-01 Payor Info: Payor: MEDICAID [place] / Plan: MEDICAID [place] ACCESS / Product Type: *No Product type* /    Admit Date/Time:  08/06/2020  1:01 AM  Primary Diagnosis:  Debility  Hospital Problems: Principal Problem:   Debility Active Problems:   Super-super obese (HCC)   Urinary retention   Hypokalemia   Labile blood glucose   Diabetic peripheral neuropathy (HCC)   Sacral pain   Hypoglycemia   Muscle spasms of both lower extremities   Recurrent knee instability, right   H/O small bowel obstruction   Muscle spasm    Expected Discharge Date: Expected Discharge Date: 09/10/20  Team Members Present: Physician leading conference: Dr. Genice Rouge Care Coodinator Present: Cecile Sheerer, LCSWA;Manda Holstad Marlyne Beards, RN, BSN, CRRN Nurse Present: Kennyth Arnold, RN PT Present: Rada Hay, PT OT Present: Roney Mans, OT;Ardis Rowan, COTA PPS Coordinator present : Fae Pippin, SLP     Current Status/Progress Goal Weekly Team Focus  Bowel/Bladder   foley catheter for wound healing incontinent of stool. Pt given SMOG enema 04/04 for constipation LBM 04/04  pt will be continent of bowel with normal bowel pattern foley remains in place at this time  toilet q2-3 hours, laxatives prn   Swallow/Nutrition/ Hydration             ADL's   UB bathing/dressing-supervision seated EOB; LB bathing-max A; LB dressing-mod A; toileting-max A/total A; sit<>stand and funcitonal transfers-min A  min A/CGA overall  standing balance, functional transfers, safety awareness, education   Mobility   bed mobility supervision w/rail, cga to min assist w/sts depending on surface height, gait w/RW x 75ft, cga,     transfers, gait endurance, LE strength to prepare for  stair training   Communication             Safety/Cognition/ Behavioral Observations            Pain   pt c/o pain on scrotum and in the wound to buttock  Pain in buttocks and scrotum pt has liquid dilaudid for dressing changes only along with Roxicodone prn , tylenol prn, and robaxin  assess pain qshift and prn premedicate prior to dressing changes   Skin   Open wound to buttocks with some tunneling present W-D dressings daily and prn  improvement to wound  assess skin qshift and prn wound care as directed     Discharge Planning:  stairs may be an issue, will continue attempts   Team Discussion: Patient was full of stool due to requesting to decrease bowel medications. Now complaining that he is having too many bowel movements. MD explained that it is because he was full of stool. Another KUB will be order to make sure he is cleaned out. Pain medication will be changed from OxyContin to Ms contin, he will not be able to go home on all of the pain medications. Blood sugars are doing good. New foley placed last week, 10/10 pain, no sleep issues reported, still has large surgical wound to bottom. Educating on diabetes management, wound care and dressing changes, HTN, and foley care. Nobody is willing to take Medicaid. Patient on target to meet rehab goals: yes, showered last week, missed some sessions due to pain, missed session today  due to bowels. Goals for ADL's are min assist to contact guard overall. Contact guard sit to stand, from a low surface he needs assistance, going towards stair training, and walking up to  60 ft with contact guard assist.  *See Care Plan and progress notes for long and short-term goals.   Revisions to Treatment Plan:  Not at this time.  Teaching Needs: Family education, medication management, pain management, bowel management, foley care education, skin/wound care, dressing change education-demonstration, transfer training, gait training, balance training,  endurance training, safety awareness, stair training.  Current Barriers to Discharge: Decreased caregiver support, Medical stability, Home enviroment access/layout, Incontinence, Wound care, Lack of/limited family support, Weight, Weight bearing restrictions, Medication compliance and Behavior  Possible Resolutions to Barriers: Continue current medications, provide emotional support.     Medical Summary Current Status: was very constipated- pain "10/10"; won't tell physician this; getting cleaned out- close to obstruction per KUB- will check again today- post op wound doing better- missed PT/OT secondary to pain/BMs in last 24 hours.  Barriers to Discharge: Decreased family/caregiver support;Home enviroment access/layout;Neurogenic Bowel & Bladder;Medical stability;Medication compliance;Weight;Wound care  Barriers to Discharge Comments: can't find H/H agency- working on it. have to send home with foley, due to wound- BMI 72; BGs better controlled Possible Resolutions to [employer]: cleaning pt out- repeat KUB today; BGs better controlled; stair training this week- focus- steps/ weight; bowel severe constipation- 4/12   Continued Need for Acute Rehabilitation Level of Care: The patient requires daily medical management by a physician with specialized training in physical medicine and rehabilitation for the following reasons: Direction of a multidisciplinary physical rehabilitation program to maximize functional independence : Yes Medical management of patient stability for increased activity during participation in an intensive rehabilitation regime.: Yes Analysis of laboratory values and/or radiology reports with any subsequent need for medication adjustment and/or medical intervention. : Yes   I attest that I was present, lead the team conference, and concur with the assessment and plan of the team.   Tennis Must 09/03/2020, 4:41 PM

## 2020-09-03 NOTE — Progress Notes (Signed)
Occupational Therapy Note  Patient Details  Name: Carl Hill MRN: [id number] Date of Birth: [birth year]  Today's Date: 09/03/2020 OT Missed Time: 60 Minutes Missed Time Reason: Nursing care;Pain;Other (comment)  Pt in sidelying in bed upon arrival. Pt received enema during the night and has been having continuous BMs throughout. Pt with BM at time of this note. Pt unable to participate 2/2 continuous BM and pain. Will attempt later.    Lavone Neri [facility] 09/03/2020, 9:48 AM

## 2020-09-03 NOTE — Progress Notes (Signed)
PROGRESS NOTE   Subjective/Complaints:  Pt reports has been having constant BMs- up all night "pooping" and still going.  Got large enema yesterday- anus hurts and is having spasms.   No other issues, except hasn't been able to do therapy since enema.    ROS:   Pt denies SOB, abd pain, CP, and vision changes   Objective:   DG Abd 1 View  Result Date: 09/02/2020 CLINICAL DATA:  Rectal pain EXAM: ABDOMEN - 1 VIEW COMPARISON:  08/16/2020 FINDINGS: Stool is present throughout the colon as noted on previous study. Air-filled nondilated loops of small bowel. Surgical clips left upper quadrant. No acute osseous abnormality. IMPRESSION: Nonobstructive bowel gas pattern. There is stool throughout the colon suggest constipation and possibly impaction. Electronically Signed   By: Guadlupe Spanish M.D.   On: 09/02/2020 16:56   Recent Labs    09/02/20 0932  WBC 6.8  HGB 11.1*  HCT 37.0*  PLT 252   Recent Labs    09/02/20 0932  NA 140  K 3.7  CL 103  CO2 29  GLUCOSE 132*  BUN 7  CREATININE 0.89  CALCIUM 9.1    Intake/Output Summary (Last 24 hours) at 09/03/2020 1014 Last data filed at 09/03/2020 0517 Gross per 24 hour  Intake 240 ml  Output 3450 ml  Net -3210 ml        Physical Exam: Vital Signs Blood pressure 137/72, pulse 96, temperature 97.7 F (36.5 C), temperature source Oral, resp. rate 18, height 5\' 10"  (1.778 m), weight (!) 227.6 kg, SpO2 92 %.   General: awake, alert, appropriate, on bariatric low air loss bed; NAD HENT: conjugate gaze; oropharynx moist CV: regular rate; no JVD Pulmonary: CTA B/L; no W/R/R- good air movement GI:  (+)BS; still diffusely mildly TTP- no rebound; hard to tell if distended due to BMI of 72- is soft Psychiatric: appropriate- upset about bowels Neurological: Ox3  Skin: -Sacral postop wound is more superficial;  the edges have contracted slightly so it is a little smaller and it  is more pink as compared to beefy red.   Vascular changes bilateral lower extremities- no change Musc: No edema in extremities.  No tenderness in extremities. Motor: Bilateral upper extremities: 5/5 throughout, unchanged Bilateral lower extremities: Hip flexion grossly 4+/5, distally 5/5, unchanged  Assessment/Plan: 1. Functional deficits which require 3+ hours per day of interdisciplinary therapy in a comprehensive inpatient rehab setting.  Physiatrist is providing close team supervision and 24 hour management of active medical problems listed below.  Physiatrist and rehab team continue to assess barriers to discharge/monitor patient progress toward functional and medical goals  Care Tool:  Bathing    Body parts bathed by patient: Right arm,Left arm,Chest,Abdomen,Front perineal area,Right upper leg,Left upper leg,Right lower leg,Left lower leg,Face   Body parts bathed by helper: Buttocks     Bathing assist Assist Level: Minimal Assistance - Patient > 75%     Upper Body Dressing/Undressing Upper body dressing   What is the patient wearing?: Pull over shirt    Upper body assist Assist Level: Supervision/Verbal cueing    Lower Body Dressing/Undressing Lower body dressing      What is the  patient wearing?: Incontinence brief     Lower body assist Assist for lower body dressing: Maximal Assistance - Patient 25 - 49%     Toileting Toileting    Toileting assist Assist for toileting: Dependent - Patient 0%     Transfers Chair/bed transfer  Transfers assist  Chair/bed transfer activity did not occur: Safety/medical concerns  Chair/bed transfer assist level: Minimal Assistance - Patient > 75%     Locomotion Ambulation   Ambulation assist   Ambulation activity did not occur: Safety/medical concerns  Assist level: 2 helpers Assistive device: Walker-rolling Max distance: 60   Walk 10 feet activity   Assist  Walk 10 feet activity did not occur:  Safety/medical concerns  Assist level: Contact Guard/Touching assist Assistive device: Walker-rolling   Walk 50 feet activity   Assist Walk 50 feet with 2 turns activity did not occur:  (straight path only)    Assistive device: Walker-rolling    Walk 150 feet activity   Assist Walk 150 feet activity did not occur: Safety/medical concerns         Walk 10 feet on uneven surface  activity   Assist Walk 10 feet on uneven surfaces activity did not occur: Safety/medical concerns         Wheelchair     Assist Will patient use wheelchair at discharge?: Yes Type of Wheelchair: Manual Wheelchair activity did not occur: Safety/medical concerns  Wheelchair assist level: Supervision/Verbal cueing Max wheelchair distance: 100'    Wheelchair 50 feet with 2 turns activity    Assist    Wheelchair 50 feet with 2 turns activity did not occur: Safety/medical concerns   Assist Level: Supervision/Verbal cueing   Wheelchair 150 feet activity     Assist  Wheelchair 150 feet activity did not occur: Safety/medical concerns       Blood pressure 137/72, pulse 96, temperature 97.7 F (36.5 C), temperature source Oral, resp. rate 18, height 5\' 10"  (1.778 m), weight (!) 227.6 kg, SpO2 92 %.  Medical Problem List and Plan: 1.  Debility secondary to septic shock from necrotizing soft tissue infection of perianal, gluteal, and perineal tissues.  3/28- con't PT and PT- progressing- walked 100 ft this weekend, total- see if pt can get into shower?- Will change dressing afterwards 3/29- walked 45 ft at once yesterday- doing shower today- did dry run as well- con't PT and OT  -4/2- con't PT and OT- will see if can sit him up at EOB to eat- but cannot address utnil I speak with 4/29  4/5- con't PT and OT- has had to miss due to constant BMs for 24 hours 2.  Antithrombotics: -DVT/anticoagulation:  Pharmaceutical:   Xarelto             -antiplatelet therapy: N/A 3.  Pain Management:  Continue Lyrica and elavil for neuropathy.              Oxycontin 15 mg bid with 10 mg prn for breakthrough pain.   On robaxin 750 mg q6 hours prn  3/29- pain controlled on current regimen- con't regimen- will need to discuss with pt, will likely need to reduce pain meds prior to d/c to get it covered by insurance.   3/30- d/w pt- will not be able to send home on three opiates- asked him to think about it.   4/1- will d/w pt again next week- con't pain regimen for now 4/2-discussed at length oxycodone versus Dilaudid for home regimen; will change  his OxyContin to  MS Contin 15 mg twice a day; will wait on changing the Dilaudid versus oxycodone. Also added kpad for pain.   4/3-patient did not report any change in his overall pain with transition to MS Contin; continue regimen  4/5- having anal pain- from constant BMs- con't regimen 4. Mood: LCSW to follow for evaluation and support.              -antipsychotic agents: N/A 5. Neuropsych: This patient is capable of making decisions on his own behalf. 6. Skin/Wound Care: Air mattress for pressure relief measures.              --will need local care frequently to keep sacral wound bed clean.             --Continue foley for now.  Added hemorrhoidal cream to assist with any pain which might be ano-rectal/hemorrhoidal  4/2-postop wound is healing; continue regimen  4/3-still requires the hemorrhoidal cream for anal pain 7. Fluids/Electrolytes/Nutrition: Monitor I/Os.  8. Necrotizing fascitis: Has been on Augmentin since 02/11--> to continue for 6 months per ID 9. T2DM with neuropathy: Hgb A1c- 9.6 (down from 13 in Aug) Will continue to monitor BS ac/hs and use SSI for elevated BS             --used Farxiga, metformin and Victoza 1.8.             --Continue Levemir bid and titrate as indicated  -decreased novolog to 10 units TID with meals   4/2-blood sugars well controlled; con't regimen  4/3-had an episode of a blood sugar of 226  yesterday; otherwise his blood sugars are running from 85- 130- con't regimen- that elevation is rare for him.  4/5- BGs 117-141- doing good- con't regimen  10 Hyponatremia:              Sodium 138 on 3/21, labs ordered for Monday 11. Hypokalemia:    Potassium 3.8 on 3/21  4/3-we will recheck his labs in the morning  4/5- K+ 3.8- con't regimen 12. Proximal A FIb: Monitor HR tid--continue Cardizem 240 mg and amiodarone for rate control.              --monitor for symptoms with increase in activity.  4/1- controlled- con't regimen 13. OIC:              Discontinued colace  4/2- will Give Sorbitol 60 ml and see if will poop- if not, will need to do Mg citrate again.    4/3-had multiple bowel movements yesterday-will add MiraLAX back scheduled  Improved   4/5- had enema yesterday and having constant BMs since then- will check another KUB to see how stool burden is doing 14. Chronic right heel ulcer: Continue to paint wound daily with iodine.   3/16- is Stage II- healing  3/29- added vaseline daily- less, but still has peeling skin- asked him to scrub in shower today  3/30- pt scrubbed in shower- will con't to monitor- actual wound appears closed now.   3/31- resolved- will d/c wound care orders and place vaseline order nightly to B/L feet and cover with socks  4/1- asked nursing to put on Vaseline on feet before bed- placed order 15.  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 Will need bariatric equipment.  3/28- BMI down to 72  4/1- weight down to 227.6 kg- so down slightly- BMI down to 72.0 form 72.4- con't regimen\  4/2-patient's goal is to be 499 pounds by discharge-he really wants  to go home on his current diabetic regimen and he actually wants to go home eating the meals he has eaten in the hospital.  Encouraged this. 16. Trach site  3/15- resolved/healed 17. Foley for urinary retention  3/23- pt wants to go home with foley- will need f/u with Urology to get out- since  bladder, being a muscle, will be weak. Will need bariatric BSC if at possible.  3/31- pt foley is required due to wounds and urinary retention- con't foley and monitor- con't [facility] baths   4/2-explained if patient cannot get home health, he will need to go to the urologist to get the Foley changed once a month. 18. Muscle spasms  See #3  3/29- doing better on Robaxin- con't regimen  4/2-mother feels that patient is back should be imaged with an MRI due to muscle spasms-I explained the muscle spasms are common when someone is working harder than they have for a while.  Also it is not midline back pain; do not think an MRI is necessary at this time.  19. Disposition:   At baseline, patient received significant assistance from his mother, aged 43. He required her assistance to wipe stool after BM due to body habitus. He was able to ambulate to bathroom and back on his own. Plan is for discharge home to father, who is a IT sales professional, and who can provide more physical support to patient. Goal is ModA level for ambulation within his room.   3/22- pt insists going home with mother- says she can do wound care better- will go to fathers when mother has surgery.    3/23- mother's surgery on 4/29  3/30- moved d/c date to 4/12 per therapy.    LOS: 28 days A FACE TO FACE EVALUATION WAS PERFORMED  Nadeen Shipman 09/03/2020, 10:14 AM

## 2020-09-03 NOTE — Progress Notes (Signed)
Patient ID: Carl Hill, male   DOB: [birth year], 50 y.o.   MRN: [id number]  SW met with pt and pt mother in room to provide updates from team conference, and d/c date remains 4/12. Reports there was a discussion with therapy to extend his discharge date since he was not able to participate in therapy over the weekend. SW informed on challenges with [facility], and waiting on an agency to accept.   HHPT/SN/Aide referral accepted by [facility]. No OT in area at this time.   Loralee Pacas, MSW, [place] Office: [phone] Cell: [phone] Fax: [phone]

## 2020-09-04 LAB — GLUCOSE, CAPILLARY
Glucose-Capillary: 115 mg/dL — ABNORMAL HIGH (ref 70–99)
Glucose-Capillary: 132 mg/dL — ABNORMAL HIGH (ref 70–99)
Glucose-Capillary: 158 mg/dL — ABNORMAL HIGH (ref 70–99)
Glucose-Capillary: 134 mg/dL — ABNORMAL HIGH (ref 70–99)

## 2020-09-04 MED ORDER — SENNOSIDES-DOCUSATE SODIUM 8.6-50 MG PO TABS
1.0000 | ORAL_TABLET | Freq: Two times a day (BID) | ORAL | Status: DC
  Administered 2020-09-04 – 2020-09-12 (×17): 1 via ORAL
  Filled 2020-09-04 (×17): qty 1

## 2020-09-04 MED ORDER — POLYETHYLENE GLYCOL 3350 17 G PO PACK: 17.0000 g | PACK | Freq: Every day | ORAL | Status: DC | PRN

## 2020-09-04 NOTE — Progress Notes (Signed)
Occupational Therapy Session Note  Patient Details  Name: Carl Hill MRN: [id number] Date of Birth: [birth year]  Today's Date: 09/04/2020 OT Individual Time: 1100-1155 OT Individual Time Calculation (min): 55 min    Short Term Goals: Week 4:  OT Short Term Goal 2 (Week 4): Pt will thread pants over BLE using AE PRN OT Short Term Goal 3 (Week 4): Pt will perform stand pivot transfers to [facility] with min A OT Short Term Goal 4 (Week 4): Pt will perform toileting tasks with max A  Skilled Therapeutic Interventions/Progress Updates:    OT intervention with focus on bed mobility, sit<>stand, standing balance, functional amb with RW, bathing at shower level, and activity tolerance to increase independence with BADLs. First attempt at supine>sit EOB pt was not able to complete 2/2 to low back pain/spasms. Pt returned to supine. Second attempt successful. Pt amb with RW into bathroom and sat on [facility] for shower. Pt used long handle sponge to assist with bathing LB and back. Pt unable to bathe buttocks 2/2 wound. Pt amb back into room and returned to EOB. Sit>supine with supervision and increased pain. Pt able to reposition in bed using bed functions. Pt remained in bed with all needs within reach. Mother present.   Therapy Documentation Precautions:  Precautions Precautions: Fall Precaution Comments: foley catheter, large buttocks wound, R heel wound Restrictions Weight Bearing Restrictions: No   Pain:  Pt c/o 8/10 low back pain; shower and repositioned    Therapy/Group: Individual Therapy  [name] 09/04/2020, 12:09 PM

## 2020-09-04 NOTE — Progress Notes (Signed)
Physical Therapy Session Note Performed as make up session (25 min)  Patient Details  Name: Carl Hill MRN: [id number] Date of Birth: 07/27/1970  Today's Date: 09/04/2020 PT Individual Time: 0915-0940 PT Individual Time Calculation (min): 25 min   Short Term Goals: Week 1:  PT Short Term Goal 1 (Week 1): Pt will tolerate sitting EOB with supervision x 10 minutes while completing a functional task PT Short Term Goal 1 - Progress (Week 1): Met PT Short Term Goal 2 (Week 1): Pt will perform STS with min A x 2 consistently PT Short Term Goal 2 - Progress (Week 1): Met PT Short Term Goal 3 (Week 1): Pt will initiate gait training. PT Short Term Goal 3 - Progress (Week 1): Met Week 2:  PT Short Term Goal 1 (Week 2): Pt will initiate gait training with RW. PT Short Term Goal 1 - Progress (Week 2): Met PT Short Term Goal 2 (Week 2): Pt will stand with RW x 2 minutes with min A x 2. PT Short Term Goal 2 - Progress (Week 2): Progressing toward goal PT Short Term Goal 3 (Week 2): Pt will tolerate >20 min of activity without incidence of pain. PT Short Term Goal 3 - Progress (Week 2): Progressing toward goal Week 3:  PT Short Term Goal 1 (Week 3): Pt will stand with RW x 2 minutes with min A x 2. PT Short Term Goal 1 - Progress (Week 3): Met PT Short Term Goal 2 (Week 3): Pt will tolerate >20 min of activity without incidence of pain. PT Short Term Goal 2 - Progress (Week 3): Progressing toward goal PT Short Term Goal 3 (Week 3): Pt will perform gait x 50 with RW and min A of 2 PT Short Term Goal 3 - Progress (Week 3): Met Week 4:  PT Short Term Goal 1 (Week 4): Pt will ambulate x 100 ft with RW and CGA x 2 PT Short Term Goal 2 (Week 4): Pt will maintain dynamic standing balance x 2 min with LRAD and CGA x 2. PT Short Term Goal 3 (Week 4): Pt will self propel w/c with supervision x 300 ft.  Skilled Therapeutic Interventions/Progress Updates:    Pain:  Pt reports 8/10 wound site pain.   Treatment to tolerance.  Rest breaks and repositioning as needed. Nursing aware, nursing also aware that wound was not dressed, requested this be completed before next session.  Pt initially sidelying and agreeable to treatment session w/focus on am ADLs.   Bed sides adjusted by therapist, Supine to sit w/supervision and use of bedrails.  Pt max assist w/lower body dressing to knees in sitting.  Dependent for shoes and socks.  Sit to stand to RW w/cga, raises pants from thighs w/mod assist. Returns to sitting w/cga.  Dons shirt w/set up only.  Nursing in room for admin of meds.  Requested she inspect wound to determine if dressing was intact.  Sit to stand w/cga to RW, therapist lowered pants for nurisng to inspect wound.  No dressing to wound.  Again requested wound be dressed prior to next scheduled session.  Sit to supine w/supervision.  Pt left sidelying w/rails up x 3, alarm set, bed in lowest position, and needs in reach.   Therapy Documentation Precautions:  Precautions Precautions: Fall Precaution Comments: foley catheter, large buttocks wound, R heel wound Restrictions Weight Bearing Restrictions: No    Therapy/Group: Individual Therapy  Callie Fielding, [place] 09/04/2020, 12:17 PM

## 2020-09-04 NOTE — Progress Notes (Signed)
Recreational Therapy Session Note  Patient Details  Name: Carl Hill MRN: [id number] Date of Birth: [birth year] Today's Date: 09/04/2020  Pain: c/o wound pain, premedicated Skilled Therapeutic Interventions/Progress Updates: Session focused on activity tolerance and ambulation during cotreat with PT.  Pt performs bed mobility with supervision and use of bed functions/rails.  Pt performs sit-stands and ambulates with RW with contact guard assist and +2 for w/c follow.  PT ambulated x5, greatest distance was 54'.  Therapy/Group: Co-Treatment  Kavan Devan 09/04/2020, 11:55 AM

## 2020-09-04 NOTE — Progress Notes (Signed)
Physical Therapy Session Note  Patient Details  Name: Carl Hill MRN: [id number] Date of Birth: 08-21-1970  Today's Date: 09/04/2020 PT Individual Time: 0915-1000; 1300-1415 PT Individual Time Calculation (min): 45 min and 75 min  Short Term Goals: Week 4:  PT Short Term Goal 1 (Week 4): Pt will ambulate x 100 ft with RW and CGA x 2 PT Short Term Goal 2 (Week 4): Pt will maintain dynamic standing balance x 2 min with LRAD and CGA x 2. PT Short Term Goal 3 (Week 4): Pt will self propel w/c with supervision x 300 ft.  Skilled Therapeutic Interventions/Progress Updates:    Session 1: Pt received sidelying in bed, agreeable to PT session. No complaints of pain at rest. Has onset of low back pain/spasms once returning to bed at end of session, utilized repositioning for pain management. Bed mobility Supervision with use of bedrail. Recommending use of bedrail at home, pt reports he and his mom have been looking into purchasing. Sit to stand with CGA to RW from bed, stabilization required on R side of RW. Stand pivot transfer to w/c with RW and CGA. Sit to stand with up to min A to RW from w/c seat height (22") throughout session. Ambulation x 20 ft, x 45 ft, x 54 ft, x 45 ft with RW and CGA for balance, w/c follow for safety. Pt reports some R knee pain and instability throughout session. Attempted to utilized R knee sleeve for increased stability, sleeve does not stay in place during gait this date. Pt returned to bed at end of session, Supervision for bed mobility. Pt left in sidelying in bed with needs in reach. Cotreatment session with LRT.  Session 2: Pt received sidelying in bed, agreeable to PT session. Pt reports he is awaiting nursing to come dress wound following shower he took during AM OT session. Nursing notified, reports they do not have adequate time to fully perform dressing of wound at this time. Nursing able to assist with placing temporary abd pad on wound to prevent drainage,  will return at end of therapy session to dress wound properly. Pt is Supervision for bed mobility with use of bedrails, requires HOB to be elevated this session due to significant low back pain/spasms. Utilized trigger point massage and applied kinesiotape for pain management. Pt's mom present for hands-on family education and training. Pt's mom able to observe bed mobility, sit to stand to RW, and stand pivot transfers with RW. Pt requires up to CGA for transfers with RW this date. Pt reports urge to have a bowel movement. Stand pivot transfer to bari [facility] with RW and CGA. Pt able to continently void, see Flowsheet for details. Pt is dependent for pericare once back in bed, unable to tolerate further sitting or standing due to back pain. Discussed safe mobility in the home with patient and his mom including having a place to sit at the bottom of the stairs so that pt can ambulate from car to bottom of stairs (~50 ft), sit, then navigate stairs into the home. Continuing to recommend a ramp be in place, pt and his mom working towards funding/charity to have a ramp placed but will not have in place prior to d/c home. Pt also reports he only has to ambulate short distances in the home with RW similar to distances he has been performing on rehab. Will continue family education with mom including practicing car transfer and more hands-on education. Pt left in sidelying in bed with  needs in reach at end of session.   Therapy Documentation Precautions:  Precautions Precautions: Fall Precaution Comments: foley catheter, large buttocks wound, R heel wound Restrictions Weight Bearing Restrictions: No   Therapy/Group: Individual Therapy   Peter Congo, PT, DPT, CSRS  09/04/2020, 12:24 PM

## 2020-09-04 NOTE — Progress Notes (Signed)
Physical Therapy Weekly Progress Note  Patient Details  Name: Carl Hill MRN: [id number] Date of Birth: December 18, 1970  Beginning of progress report period: August 28, 2020 End of progress report period: September 04, 2020  Patient has met 1 of 3 short term goals.  Pt is slowly making progress towards LTG. Pt remains very motivated and exhibits great participation in therapy sessions. Pt was limited over the past few days by ongoing bowel issues and was not able to functionally participate in therapy sessions due to pain and frequent bowel movements. Pt was able to perform better during therapy sessions this date. Pt's mom was able to initiate family education this date and will be present for ongoing education leading up to d/c home.  Patient continues to demonstrate the following deficits muscle weakness, decreased cardiorespiratoy endurance and decreased standing balance, decreased postural control and decreased balance strategies and therefore will continue to benefit from skilled PT intervention to increase functional independence with mobility.  Patient progressing toward long term goals..  Continue plan of care.  PT Short Term Goals Week 4:  PT Short Term Goal 1 (Week 4): Pt will ambulate x 100 ft with RW and CGA x 2 PT Short Term Goal 1 - Progress (Week 4): Progressing toward goal PT Short Term Goal 2 (Week 4): Pt will maintain dynamic standing balance x 2 min with LRAD and CGA x 2. PT Short Term Goal 2 - Progress (Week 4): Met PT Short Term Goal 3 (Week 4): Pt will self propel w/c with supervision x 300 ft. PT Short Term Goal 3 - Progress (Week 4): Progressing toward goal Week 5:  PT Short Term Goal 1 (Week 5): =LTG due to ELOS   Therapy Documentation Precautions:  Precautions Precautions: Fall Precaution Comments: foley catheter, large buttocks wound, R heel wound Restrictions Weight Bearing Restrictions: No   Therapy/Group: Individual Therapy   Excell Seltzer, PT, DPT,  CSRS  09/04/2020, 7:45 AM

## 2020-09-04 NOTE — Progress Notes (Signed)
PROGRESS NOTE   Subjective/Complaints: Pt thought he was empty of stool- after Many BMs, per pt However KUB of abdomen shows "increased stool burden" and stool throughout colon, and rectum". Which is amazing due to >10 BMs in last 48 hours.  Pt concerned he's lost therapy and wants to stay longer- will d/w therapy- will not get H/H after d/c.   D/w pt how to use B&O suppositories when clean him out again and using Mg citrate as well as enema- need to get all stool moved down.  Has family ed today.   ROS:   Pt denies SOB, abd pain, CP, N/V, and vision changes   Objective:   DG Abd 1 View  Result Date: 09/03/2020 CLINICAL DATA:  Constipation, pain in rectum, diabetes mellitus, multiple prior abdominal surgeries EXAM: ABDOMEN - 1 VIEW COMPARISON:  None FINDINGS: Increased stool throughout colon and rectum. No bowel dilatation or bowel wall thickening. Bones demineralized without focal abnormality. No urinary tract calcification. IMPRESSION: Increased stool throughout colon and rectum. Electronically Signed   By: Ulyses Southward M.D.   On: 09/03/2020 14:26   Recent Labs    09/02/20 0932  WBC 6.8  HGB 11.1*  HCT 37.0*  PLT 252   Recent Labs    09/02/20 0932  NA 140  K 3.7  CL 103  CO2 29  GLUCOSE 132*  BUN 7  CREATININE 0.89  CALCIUM 9.1    Intake/Output Summary (Last 24 hours) at 09/04/2020 1934 Last data filed at 09/04/2020 1856 Gross per 24 hour  Intake 960 ml  Output 1450 ml  Net -490 ml        Physical Exam: Vital Signs Blood pressure 115/61, pulse 99, temperature (!) 97.5 F (36.4 C), temperature source Oral, resp. rate 18, height 5\' 10"  (1.778 m), weight (!) 227.6 kg, SpO2 95 %.    General: awake, alert, appropriate, is sitting up slightly in bariatric  low air loss mattress; frustrated over bowels NAD; BMI 72 HENT: conjugate gaze; oropharynx moist CV: regular rate overall, but sounds in 90s; no  JVD Pulmonary: CTA B/L; no W/R/R- good air movement GI: soft, NT, (+)BS- normoactive; hard to assess for distension Psychiatric: appropriate, but frustrated over bowels Neurological: Ox3  Skin: -Sacral postop wound is more superficial;  the edges have contracted slightly so it is a little smaller and it is more pink as compared to beefy red.   Vascular changes bilateral lower extremities- no change Musc: No edema in extremities.  No tenderness in extremities. Motor: Bilateral upper extremities: 5/5 throughout, unchanged Bilateral lower extremities: Hip flexion grossly 4+/5, distally 5/5, unchanged  Assessment/Plan: 1. Functional deficits which require 3+ hours per day of interdisciplinary therapy in a comprehensive inpatient rehab setting.  Physiatrist is providing close team supervision and 24 hour management of active medical problems listed below.  Physiatrist and rehab team continue to assess barriers to discharge/monitor patient progress toward functional and medical goals  Care Tool:  Bathing    Body parts bathed by patient: Right arm,Left arm,Chest,Abdomen,Front perineal area,Right upper leg,Left upper leg,Right lower leg,Left lower leg,Face   Body parts bathed by helper: Buttocks     Bathing assist Assist Level:  Minimal Assistance - Patient > 75%     Upper Body Dressing/Undressing Upper body dressing   What is the patient wearing?: Pull over shirt    Upper body assist Assist Level: Supervision/Verbal cueing    Lower Body Dressing/Undressing Lower body dressing      What is the patient wearing?: Incontinence brief     Lower body assist Assist for lower body dressing: Maximal Assistance - Patient 25 - 49%     Toileting Toileting    Toileting assist Assist for toileting: Dependent - Patient 0%     Transfers Chair/bed transfer  Transfers assist  Chair/bed transfer activity did not occur: Safety/medical concerns  Chair/bed transfer assist level: Contact  Guard/Touching assist     Locomotion Ambulation   Ambulation assist   Ambulation activity did not occur: Safety/medical concerns  Assist level: Contact Guard/Touching assist Assistive device: Walker-rolling Max distance: 54'   Walk 10 feet activity   Assist  Walk 10 feet activity did not occur: Safety/medical concerns  Assist level: Contact Guard/Touching assist Assistive device: Walker-rolling   Walk 50 feet activity   Assist Walk 50 feet with 2 turns activity did not occur:  (straight path only)  Assist level: Contact Guard/Touching assist Assistive device: Walker-rolling    Walk 150 feet activity   Assist Walk 150 feet activity did not occur: Safety/medical concerns         Walk 10 feet on uneven surface  activity   Assist Walk 10 feet on uneven surfaces activity did not occur: Safety/medical concerns         Wheelchair     Assist Will patient use wheelchair at discharge?: Yes Type of Wheelchair: Manual Wheelchair activity did not occur: Safety/medical concerns  Wheelchair assist level: Supervision/Verbal cueing Max wheelchair distance: 100'    Wheelchair 50 feet with 2 turns activity    Assist    Wheelchair 50 feet with 2 turns activity did not occur: Safety/medical concerns   Assist Level: Supervision/Verbal cueing   Wheelchair 150 feet activity     Assist  Wheelchair 150 feet activity did not occur: Safety/medical concerns       Blood pressure 115/61, pulse 99, temperature (!) 97.5 F (36.4 C), temperature source Oral, resp. rate 18, height 5\' 10"  (1.778 m), weight (!) 227.6 kg, SpO2 95 %.  Medical Problem List and Plan: 1.  Debility secondary to septic shock from necrotizing soft tissue infection of perianal, gluteal, and perineal tissues.  3/28- con't PT and PT- progressing- walked 100 ft this weekend, total- see if pt can get into shower?- Will change dressing afterwards 3/29- walked 45 ft at once yesterday- doing  shower today- did dry run as well- con't PT and OT  -4/2- con't PT and OT- will see if can sit him up at EOB to eat- but cannot address utnil I speak with Cheri GuppyJennifer Bailey  4/6-continue PT and OT-we will discuss with team if we can extend him a little more-as of tomorrow 4 7 he will be here 30-days. 2.  Antithrombotics: -DVT/anticoagulation:  Pharmaceutical:   Xarelto             -antiplatelet therapy: N/A 3. Pain Management:  Continue Lyrica and elavil for neuropathy.              Oxycontin 15 mg bid with 10 mg prn for breakthrough pain.   On robaxin 750 mg q6 hours prn  3/29- pain controlled on current regimen- con't regimen- will need to discuss with pt, will likely  need to reduce pain meds prior to d/c to get it covered by insurance.   3/30- d/w pt- will not be able to send home on three opiates- asked him to think about it.   4/1- will d/w pt again next week- con't pain regimen for now 4/2-discussed at length oxycodone versus Dilaudid for home regimen; will change  his OxyContin to MS Contin 15 mg twice a day; will wait on changing the Dilaudid versus oxycodone. Also added kpad for pain.   4/3-patient did not report any change in his overall pain with transition to MS Contin; continue regimen  4/5- having anal pain- from constant BMs- con't regimen  4/6-discussed using B and O suppositories for anal cramping when cleaning them out again over this weekend-also discussed using as little pain medicines as possible to try to reduce constipation. 4. Mood: LCSW to follow for evaluation and support.              -antipsychotic agents: N/A 5. Neuropsych: This patient is capable of making decisions on his own behalf. 6. Skin/Wound Care: Air mattress for pressure relief measures.              --will need local care frequently to keep sacral wound bed clean.             --Continue foley for now.  Added hemorrhoidal cream to assist with any pain which might be ano-rectal/hemorrhoidal  4/2-postop  wound is healing; continue regimen  4/3-still requires the hemorrhoidal cream for anal pain 7. Fluids/Electrolytes/Nutrition: Monitor I/Os.  8. Necrotizing fascitis: Has been on Augmentin since 02/11--> to continue for 6 months per ID 9. T2DM with neuropathy: Hgb A1c- 9.6 (down from 13 in Aug) Will continue to monitor BS ac/hs and use SSI for elevated BS             --used Farxiga, metformin and Victoza 1.8.             --Continue Levemir bid and titrate as indicated  -decreased novolog to 10 units TID with meals   4/2-blood sugars well controlled; con't regimen  4/3-had an episode of a blood sugar of 226 yesterday; otherwise his blood sugars are running from 85- 130- con't regimen- that elevation is rare for him.  4/5- BGs 117-141- doing good- con't regimen  10 Hyponatremia:              Sodium 138 on 3/21, labs ordered for Monday 11. Hypokalemia:    Potassium 3.8 on 3/21  4/3-we will recheck his labs in the morning  4/5- K+ 3.8- con't regimen 12. Proximal A FIb: Monitor HR tid--continue Cardizem 240 mg and amiodarone for rate control.              --monitor for symptoms with increase in activity.  4/1- controlled- con't regimen 13. OIC:              Discontinued colace  4/2- will Give Sorbitol 60 ml and see if will poop- if not, will need to do Mg citrate again.    4/3-had multiple bowel movements yesterday-will add MiraLAX back scheduled  Improved   4/5- had enema yesterday and having constant BMs since then- will check another KUB to see how stool burden is doing  4/6-patient is still full of stool according to the latest KUB-discussed with patient waiting till this weekend when he did not have therapy to clean him out further-increased his MiraLAX as well as added Senokot 1 tab twice daily-do not  want to do today or tomorrow because we do not want to miss more therapy if we can help 14. Chronic right heel ulcer: Continue to paint wound daily with iodine.   3/16- is Stage II-  healing  3/29- added vaseline daily- less, but still has peeling skin- asked him to scrub in shower today  3/30- pt scrubbed in shower- will con't to monitor- actual wound appears closed now.   3/31- resolved- will d/c wound care orders and place vaseline order nightly to B/L feet and cover with socks  4/1- asked nursing to put on Vaseline on feet before bed- placed order 15.  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 Will need bariatric equipment.  3/28- BMI down to 72  4/1- weight down to 227.6 kg- so down slightly- BMI down to 72.0 form 72.4- con't regimen\  4/2-patient's goal is to be 499 pounds by discharge-he really wants to go home on his current diabetic regimen and he actually wants to go home eating the meals he has eaten in the hospital.  Encouraged this. 16. Trach site  3/15- resolved/healed 17. Foley for urinary retention  3/23- pt wants to go home with foley- will need f/u with Urology to get out- since bladder, being a muscle, will be weak. Will need bariatric BSC if at possible.  3/31- pt foley is required due to wounds and urinary retention- con't foley and monitor- con't [facility] baths   4/2-explained if patient cannot get home health, he will need to go to the urologist to get the Foley changed once a month.  4/6-we will not be able to get home health therapy or nursing- so will need to set up with urology to make sure he can get his Foley changed monthly 18. Muscle spasms  See #3  3/29- doing better on Robaxin- con't regimen  4/2-mother feels that patient is back should be imaged with an MRI due to muscle spasms-I explained the muscle spasms are common when someone is working harder than they have for a while.  Also it is not midline back pain; do not think an MRI is necessary at this time.  19. Disposition:   At baseline, patient received significant assistance from his mother, aged 14. He required her assistance to wipe stool after BM due to body habitus. He was able  to ambulate to bathroom and back on his own. Plan is for discharge home to father, who is a IT sales professional, and who can provide more physical support to patient. Goal is ModA level for ambulation within his room.   3/22- pt insists going home with mother- says she can do wound care better- will go to fathers when mother has surgery.    3/23- mother's surgery on 4/29  3/30- moved d/c date to 4/12 per therapy.    LOS: 29 days A FACE TO FACE EVALUATION WAS PERFORMED  Dervin Vore 09/04/2020, 7:34 PM

## 2020-09-05 LAB — GLUCOSE, CAPILLARY
Glucose-Capillary: 142 mg/dL — ABNORMAL HIGH (ref 70–99)
Glucose-Capillary: 103 mg/dL — ABNORMAL HIGH (ref 70–99)
Glucose-Capillary: 168 mg/dL — ABNORMAL HIGH (ref 70–99)
Glucose-Capillary: 119 mg/dL — ABNORMAL HIGH (ref 70–99)

## 2020-09-05 MED ORDER — HYDROCORTISONE (PERIANAL) 2.5 % EX CREA
TOPICAL_CREAM | Freq: Four times a day (QID) | CUTANEOUS | Status: DC
  Filled 2020-09-05 (×3): qty 28.35

## 2020-09-05 MED ORDER — MAGNESIUM CITRATE PO SOLN
1.0000 | Freq: Once | ORAL | Status: AC
  Administered 2020-09-06: 1 via ORAL
  Filled 2020-09-05: qty 296

## 2020-09-05 NOTE — Progress Notes (Signed)
Physical Therapy Session Note  Patient Details  Name: Carl Hill MRN: [id number] Date of Birth: [birth year]  Today's Date: 09/05/2020 PT Individual Time: 1410-1500 PT Individual Time Calculation (min): 50 min   Short Term Goals: Week 1:  PT Short Term Goal 1 (Week 1): Pt will tolerate sitting EOB with supervision x 10 minutes while completing a functional task PT Short Term Goal 1 - Progress (Week 1): Met PT Short Term Goal 2 (Week 1): Pt will perform STS with min A x 2 consistently PT Short Term Goal 2 - Progress (Week 1): Met PT Short Term Goal 3 (Week 1): Pt will initiate gait training. PT Short Term Goal 3 - Progress (Week 1): Met Week 2:  PT Short Term Goal 1 (Week 2): Pt will initiate gait training with RW. PT Short Term Goal 1 - Progress (Week 2): Met PT Short Term Goal 2 (Week 2): Pt will stand with RW x 2 minutes with min A x 2. PT Short Term Goal 2 - Progress (Week 2): Progressing toward goal PT Short Term Goal 3 (Week 2): Pt will tolerate >20 min of activity without incidence of pain. PT Short Term Goal 3 - Progress (Week 2): Progressing toward goal Week 3:  PT Short Term Goal 1 (Week 3): Pt will stand with RW x 2 minutes with min A x 2. PT Short Term Goal 1 - Progress (Week 3): Met PT Short Term Goal 2 (Week 3): Pt will tolerate >20 min of activity without incidence of pain. PT Short Term Goal 2 - Progress (Week 3): Progressing toward goal PT Short Term Goal 3 (Week 3): Pt will perform gait x 50 with RW and min A of 2 PT Short Term Goal 3 - Progress (Week 3): Met  Skilled Therapeutic Interventions/Progress Updates:    Pain:  Pt reports intermittent buttock pain.  Treatment to tolerance.  Rest breaks and repositioning as needed.  Pt initially supine and agreeable to treatment session w/focus on functional mobility.   Supine to sit mod I w/rail.  Dons shorts w/mod assist in sitting to thighs, shirt w/set up, shoes w/max assist.  Sit to stand w/cga w/bed elevated to RW,  raises pants w/min assist.   stand pivot transfer to wc w/RW and cga. Pt transported to Day room.  RT in for CoRx w/pt. stand pivot transfer to mat w/mod assist for Sit to stand from wc. Sit to stand from elevated mat to scale and pt weighed = 500.6.lbs Pt transported to gym for initiation of stair training. Sit to stand w/mod assist from wc.   Pt able to ascend/descend single 3in step repeated x 4-5 w/2rails and close contact guarding/ heavy reliance on UEs. Attempted .ascending 6 in step, able to place single foot onto step but unable to power up w/max assist. Gait:  22f w/RW, cga, close wc follow. At end of session, pt left oob in wc w/needs in reach.  Therapy Documentation Precautions:  Precautions Precautions: Fall Precaution Comments: foley catheter, large buttocks wound, R heel wound Restrictions Weight Bearing Restrictions: No    Therapy/Group: Individual Therapy  BCallie Fielding PBoswell4/11/2020, 4:00 PM

## 2020-09-05 NOTE — Progress Notes (Signed)
Occupational Therapy Session Note  Patient Details  Name: Carl Hill MRN: [id number] Date of Birth: 04/11/1971  Today's Date: 09/05/2020 OT Individual Time: 5379-4327 OT Individual Time Calculation (min): 30 min    Short Term Goals: Week 2:  OT Short Term Goal 1 (Week 2): Patient will complete sit<>stand with max A +2 in prep for transfer OT Short Term Goal 1 - Progress (Week 2): Met OT Short Term Goal 2 (Week 2): Pt will complete LB bathing tasks with max A sitting EOB using AE PRN OT Short Term Goal 2 - Progress (Week 2): Met OT Short Term Goal 3 (Week 2): Pt will don socks with max A using AE PRN OT Short Term Goal 3 - Progress (Week 2): Progressing toward goal  Skilled Therapeutic Interventions/Progress Updates:    1:1. Pt received in bed with no pain reported. Pt completes stand pivot transfers throughtout session with A only to stabilize walker and requires A to don shoes at EOB. In tx gym, OT demo use of reacher and shoe funnel to doff/don shoes. Pt able to return demo with MIN cueing and increased time. Pt returns to room and bed with similar A as stated above. Exited session with pt seated in bed, exit alarm on and call light in reach   Therapy Documentation Precautions:  Precautions Precautions: Fall Precaution Comments: foley catheter, large buttocks wound, R heel wound Restrictions Weight Bearing Restrictions: No General:   Vital Signs: Therapy Vitals Temp: 98.4 F (36.9 C) Temp Source: Oral Pulse Rate: 93 Resp: 18 BP: 125/64 Patient Position (if appropriate): Lying Oxygen Therapy SpO2: 94 % O2 Device: Room Air Pain:   ADL: ADL Eating: Set up Grooming: Minimal assistance Upper Body Bathing: Moderate assistance Lower Body Bathing: Dependent Upper Body Dressing: Maximal assistance Lower Body Dressing: Dependent Toileting: Dependent Toilet Transfer: Unable to assess Gaffer Transfer: Unable to assess Vision   Perception    Praxis    Exercises:   Other Treatments:     Therapy/Group: Individual Therapy  Tonny Branch 09/05/2020, 6:53 AM

## 2020-09-05 NOTE — Progress Notes (Signed)
PROGRESS NOTE   Subjective/Complaints:  Pt reports had a grapefruit sized BM- stool ball last night- had to be dug out of him.   Will change anusol and tucks to after every BM.  Will try and see if need to keep him longer to get home safely.  Will give him Mg citrate Friday afternoon, since off therapy Saturday.   ROS:   Pt denies SOB, abd pain, CP, N/V/C/D, and vision changes   Objective:   No results found. No results for input(s): WBC, HGB, HCT, PLT in the last 72 hours. No results for input(s): NA, K, CL, CO2, GLUCOSE, BUN, CREATININE, CALCIUM in the last 72 hours.  Intake/Output Summary (Last 24 hours) at 09/05/2020 1344 Last data filed at 09/05/2020 1332 Gross per 24 hour  Intake 720 ml  Output 4550 ml  Net -3830 ml        Physical Exam: Vital Signs Blood pressure 125/64, pulse 93, temperature 98.4 F (36.9 C), temperature source Oral, resp. rate 18, height 5\' 10"  (1.778 m), weight (!) 227.6 kg, SpO2 94 %.     General: awake, alert, appropriate, in bariatric low loss air mattress; therapy in room, NAD HENT: conjugate gaze; oropharynx moist CV: regular rate; no JVD Pulmonary: CTA B/L; no W/R/R- good air movement GI: soft, NT, ND, (+)BS- BMI 72 Psychiatric: appropriate- talkative Neurological: Ox3 Skin: -Sacral postop wound is more superficial;  the edges have contracted slightly so it is a little smaller and it is more pink as compared to beefy red.   Vascular changes bilateral lower extremities- no change Musc: No edema in extremities.  No tenderness in extremities. Motor: Bilateral upper extremities: 5/5 throughout, unchanged Bilateral lower extremities: Hip flexion grossly 4+/5, distally 5/5, unchanged  Assessment/Plan: 1. Functional deficits which require 3+ hours per day of interdisciplinary therapy in a comprehensive inpatient rehab setting.  Physiatrist is providing close team supervision and 24  hour management of active medical problems listed below.  Physiatrist and rehab team continue to assess barriers to discharge/monitor patient progress toward functional and medical goals  Care Tool:  Bathing    Body parts bathed by patient: Right arm,Left arm,Chest,Abdomen,Front perineal area,Right upper leg,Left upper leg,Right lower leg,Left lower leg,Face   Body parts bathed by helper: Buttocks     Bathing assist Assist Level: Minimal Assistance - Patient > 75%     Upper Body Dressing/Undressing Upper body dressing   What is the patient wearing?: Pull over shirt    Upper body assist Assist Level: Supervision/Verbal cueing    Lower Body Dressing/Undressing Lower body dressing      What is the patient wearing?: Incontinence brief     Lower body assist Assist for lower body dressing: Maximal Assistance - Patient 25 - 49%     Toileting Toileting    Toileting assist Assist for toileting: Dependent - Patient 0%     Transfers Chair/bed transfer  Transfers assist  Chair/bed transfer activity did not occur: Safety/medical concerns  Chair/bed transfer assist level: Contact Guard/Touching assist     Locomotion Ambulation   Ambulation assist   Ambulation activity did not occur: Safety/medical concerns  Assist level: Contact Guard/Touching assist Assistive device: Walker-rolling  Max distance: 22'   Walk 10 feet activity   Assist  Walk 10 feet activity did not occur: Safety/medical concerns  Assist level: Contact Guard/Touching assist Assistive device: Walker-rolling   Walk 50 feet activity   Assist Walk 50 feet with 2 turns activity did not occur:  (straight path only)  Assist level: Contact Guard/Touching assist Assistive device: Walker-rolling    Walk 150 feet activity   Assist Walk 150 feet activity did not occur: Safety/medical concerns         Walk 10 feet on uneven surface  activity   Assist Walk 10 feet on uneven surfaces activity  did not occur: Safety/medical concerns         Wheelchair     Assist Will patient use wheelchair at discharge?: Yes Type of Wheelchair: Manual Wheelchair activity did not occur: Safety/medical concerns  Wheelchair assist level: Supervision/Verbal cueing Max wheelchair distance: 100'    Wheelchair 50 feet with 2 turns activity    Assist    Wheelchair 50 feet with 2 turns activity did not occur: Safety/medical concerns   Assist Level: Supervision/Verbal cueing   Wheelchair 150 feet activity     Assist  Wheelchair 150 feet activity did not occur: Safety/medical concerns       Blood pressure 125/64, pulse 93, temperature 98.4 F (36.9 C), temperature source Oral, resp. rate 18, height 5\' 10"  (1.778 m), weight (!) 227.6 kg, SpO2 94 %.  Medical Problem List and Plan: 1.  Debility secondary to septic shock from necrotizing soft tissue infection of perianal, gluteal, and perineal tissues.  3/28- con't PT and PT- progressing- walked 100 ft this weekend, total- see if pt can get into shower?- Will change dressing afterwards 3/29- walked 45 ft at once yesterday- doing shower today- did dry run as well- con't PT and OT  -4/2- con't PT and OT- will see if can sit him up at EOB to eat- but cannot address utnil I speak with 4/29  4/6-continue PT and OT-we will discuss with team if we can extend him a little more-as of tomorrow 4 7 he will be here 30-days.  4/7- will speak with PT about when pt needs to go home.  2.  Antithrombotics: -DVT/anticoagulation:  Pharmaceutical:   Xarelto             -antiplatelet therapy: N/A 3. Pain Management:  Continue Lyrica and elavil for neuropathy.              Oxycontin 15 mg bid with 10 mg prn for breakthrough pain.   On robaxin 750 mg q6 hours prn  3/29- pain controlled on current regimen- con't regimen- will need to discuss with pt, will likely need to reduce pain meds prior to d/c to get it covered by insurance.   3/30- d/w  pt- will not be able to send home on three opiates- asked him to think about it.   4/1- will d/w pt again next week- con't pain regimen for now 4/2-discussed at length oxycodone versus Dilaudid for home regimen; will change  his OxyContin to MS Contin 15 mg twice a day; will wait on changing the Dilaudid versus oxycodone. Also added kpad for pain.   4/3-patient did not report any change in his overall pain with transition to MS Contin; continue regimen  4/5- having anal pain- from constant BMs- con't regimen  4/6-discussed using B and O suppositories for anal cramping when cleaning them out again over this weekend-also discussed using as little pain  medicines as possible to try to reduce constipation. 4. Mood: LCSW to follow for evaluation and support.              -antipsychotic agents: N/A 5. Neuropsych: This patient is capable of making decisions on his own behalf. 6. Skin/Wound Care: Air mattress for pressure relief measures.              --will need local care frequently to keep sacral wound bed clean.             --Continue foley for now.  Added hemorrhoidal cream to assist with any pain which might be ano-rectal/hemorrhoidal  4/2-postop wound is healing; continue regimen  4/3-still requires the hemorrhoidal cream for anal pain  4/7- changed Anusol and tucks to after every BM 7. Fluids/Electrolytes/Nutrition: Monitor I/Os.  8. Necrotizing fascitis: Has been on Augmentin since 02/11--> to continue for 6 months per ID 9. T2DM with neuropathy: Hgb A1c- 9.6 (down from 13 in Aug) Will continue to monitor BS ac/hs and use SSI for elevated BS             --used Farxiga, metformin and Victoza 1.8.             --Continue Levemir bid and titrate as indicated  -decreased novolog to 10 units TID with meals   4/2-blood sugars well controlled; con't regimen  4/3-had an episode of a blood sugar of 226 yesterday; otherwise his blood sugars are running from 85- 130- con't regimen- that elevation is rare  for him.  4/5- BGs 117-141- doing good- con't regimen  4/7- BGs 102-158- con't regimen 10 Hyponatremia:              Sodium 138 on 3/21, labs ordered for Monday 11. Hypokalemia:    Potassium 3.8 on 3/21  4/3-we will recheck his labs in the morning  4/5- K+ 3.8- con't regimen 12. Proximal A FIb: Monitor HR tid--continue Cardizem 240 mg and amiodarone for rate control.              --monitor for symptoms with increase in activity.  4/1- controlled- con't regimen 13. OIC:              Discontinued colace  4/2- will Give Sorbitol 60 ml and see if will poop- if not, will need to do Mg citrate again.    4/3-had multiple bowel movements yesterday-will add MiraLAX back scheduled  Improved   4/5- had enema yesterday and having constant BMs since then- will check another KUB to see how stool burden is doing  4/6-patient is still full of stool according to the latest KUB-discussed with patient waiting till this weekend when he did not have therapy to clean him out further-increased his MiraLAX as well as added Senokot 1 tab twice daily-do not want to do today or tomorrow because we do not want to miss more therapy if we can help  4/7- large BM last night- will get cleaned out more starting Friday/Saturday.  14. Chronic right heel ulcer: Continue to paint wound daily with iodine.   3/16- is Stage II- healing  3/29- added vaseline daily- less, but still has peeling skin- asked him to scrub in shower today  3/30- pt scrubbed in shower- will con't to monitor- actual wound appears closed now.   3/31- resolved- will d/c wound care orders and place vaseline order nightly to B/L feet and cover with socks  4/1- asked nursing to put on Vaseline on feet before bed- placed order 15.  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 Will need bariatric equipment.  3/28- BMI down to 72  4/1- weight down to 227.6 kg- so down slightly- BMI down to 72.0 form 72.4- con't regimen\  4/2-patient's goal is to be 499  pounds by discharge-he really wants to go home on his current diabetic regimen and he actually wants to go home eating the meals he has eaten in the hospital.  Encouraged this. 16. Trach site  3/15- resolved/healed 17. Foley for urinary retention  3/23- pt wants to go home with foley- will need f/u with Urology to get out- since bladder, being a muscle, will be weak. Will need bariatric BSC if at possible.  3/31- pt foley is required due to wounds and urinary retention- con't foley and monitor- con't [facility] baths   4/2-explained if patient cannot get home health, he will need to go to the urologist to get the Foley changed once a month.  4/6-we will not be able to get home health therapy or nursing- so will need to set up with urology to make sure he can get his Foley changed monthly 18. Muscle spasms  See #3  3/29- doing better on Robaxin- con't regimen  4/2-mother feels that patient is back should be imaged with an MRI due to muscle spasms-I explained the muscle spasms are common when someone is working harder than they have for a while.  Also it is not midline back pain; do not think an MRI is necessary at this time.  19. Disposition:   At baseline, patient received significant assistance from his mother, aged 68. He required her assistance to wipe stool after BM due to body habitus. He was able to ambulate to bathroom and back on his own. Plan is for discharge home to father, who is a IT sales professional, and who can provide more physical support to patient. Goal is ModA level for ambulation within his room.   3/22- pt insists going home with mother- says she can do wound care better- will go to fathers when mother has surgery.    3/23- mother's surgery on 4/29  3/30- moved d/c date to 4/12 per therapy.   4/7- will see if we need ot keep longer to get home safely- every pt can benefit from staying longer- but what's needed to get pt home.    LOS: 30 days A FACE TO FACE EVALUATION WAS  PERFORMED  Carl Hill 09/05/2020, 1:44 PM

## 2020-09-05 NOTE — Progress Notes (Signed)
Recreational Therapy Session Note  Patient Details  Name: Carl Hill MRN: [id number] Date of Birth: 1971-03-07 Today's Date: 09/05/2020  Pain: no c/o Skilled Therapeutic Interventions/Progress Updates: Pt participated in animal assisted activity bed level with supervision.  Pt also seen with PT with a focus on stair training.  Pt able to ascend/descend 1 3 inch step using B rails 5 times without a seated rest with contact guard assist.  Pt did require mod assist for sit-stand in preparation for stair training. +2 for safety, keeping w/c close to pt with this activity.  Pt requested to attempt 1 6" step.  Pt performed sit-stand with mod assist but was unable to do so.   Enna Warwick 09/05/2020, 3:29 PM

## 2020-09-05 NOTE — Progress Notes (Signed)
Occupational Therapy Session Note  Patient Details  Name: VIDUR KNUST MRN: [id number] Date of Birth: 06/18/1970  Today's Date: 09/05/2020 OT Individual Time: 4628-6381  And 7711-6579 OT Individual Time Calculation (min): 60 min and 70 min   Short Term Goals: Week 2:  OT Short Term Goal 1 (Week 2): Patient will complete sit<>stand with max A +2 in prep for transfer OT Short Term Goal 1 - Progress (Week 2): Met OT Short Term Goal 2 (Week 2): Pt will complete LB bathing tasks with max A sitting EOB using AE PRN OT Short Term Goal 2 - Progress (Week 2): Met OT Short Term Goal 3 (Week 2): Pt will don socks with max A using AE PRN OT Short Term Goal 3 - Progress (Week 2): Progressing toward goal Week 3:  OT Short Term Goal 1 (Week 3): Pt will don socks with max A using AE PRN OT Short Term Goal 1 - Progress (Week 3): Met OT Short Term Goal 2 (Week 3): Pt will perform stand pivot transfer to [facility] with max A+1 OT Short Term Goal 2 - Progress (Week 3): Met OT Short Term Goal 3 (Week 3): Pt will thread pants over BLE using AE PRN OT Short Term Goal 3 - Progress (Week 3): Progressing toward goal Week 4:  OT Short Term Goal 2 (Week 4): Pt will thread pants over BLE using AE PRN OT Short Term Goal 3 (Week 4): Pt will perform stand pivot transfers to [facility] with min A OT Short Term Goal 4 (Week 4): Pt will perform toileting tasks with max A   Skilled Therapeutic Interventions/Progress Updates:    Pt greeted at time of session supine in bed resting agreeable to OT session. Initial part of session spent with pt recounting recent BM events leading up to today to explain why he may be a little weaker and missed a couple days of therapy. MD entered at this time for morning rounds as well. Supine > sit Supervision able to direct care for bed management, and donned pants with Max A assist to thread feet and foley, able to help with donning over hips with unilateral support on RW. Stand pivot CGA to  wheelchair and able to self propel with feet and BUE propulsion to gym. Pt wanting to work on LE strengthening on [place], level 66 for 7 minutes and minimal fatigue noted. Dynamic standing at high/low table for ring arc standing for 1:15. Once back in room, stand pivot > bed CGA with RW. Sit > supine Supervision. Call bell in reach all needs met.   Session 2: Pt greeted at time of session sitting up in wheelchair ready for therapist for planned off unit trip to deliver cookies to MICU on 2nd floor (previously approved by MD), nursing aware. Assisted with wheelchair management using therapy stool and brakes, assisted with propelling to 2nd floor and pt able to hit elevator buttons. Pt noted to have uplifted mood and staff grateful. Once back on 4th floor stand pivot wheelchair <> therapy mat with bariwalker CGA and participated in several dynamic seated activities/core strengthening with ball hits in reps of 30 and using 4# dowel. Stand pivot mat > wheelchair > bed CGA overall and needing frequent rest breaks d/t fatigue. Sit > supine Supervision and again directing care. Call bell in reach all needs met.   Therapy Documentation Precautions:  Precautions Precautions: Fall Precaution Comments: foley catheter, large buttocks wound, R heel wound Restrictions Weight Bearing Restrictions: No  Therapy/Group: Individual Therapy  Viona Gilmore 09/05/2020, 9:24 AM

## 2020-09-06 LAB — GLUCOSE, CAPILLARY
Glucose-Capillary: 116 mg/dL — ABNORMAL HIGH (ref 70–99)
Glucose-Capillary: 197 mg/dL — ABNORMAL HIGH (ref 70–99)
Glucose-Capillary: 139 mg/dL — ABNORMAL HIGH (ref 70–99)
Glucose-Capillary: 176 mg/dL — ABNORMAL HIGH (ref 70–99)

## 2020-09-06 MED ORDER — BISACODYL 10 MG RE SUPP: 10.0000 mg | Freq: Every day | RECTAL | 0 refills | Status: DC | PRN

## 2020-09-06 MED ORDER — BELLADONNA ALKALOIDS-OPIUM 16.2-60 MG RE SUPP: 1.0000 | Freq: Three times a day (TID) | RECTAL | Status: DC | PRN

## 2020-09-06 MED ORDER — WHITE PETROLATUM EX OINT: 1.0000 "application " | TOPICAL_OINTMENT | Freq: Every day | CUTANEOUS | 0 refills | Status: DC

## 2020-09-06 MED ORDER — DIAZEPAM 5 MG PO TABS
5.0000 mg | ORAL_TABLET | Freq: Three times a day (TID) | ORAL | Status: DC | PRN
  Administered 2020-09-06 – 2020-09-08 (×4): 5 mg via ORAL
  Filled 2020-09-06 (×4): qty 1

## 2020-09-06 MED ORDER — ACETAMINOPHEN 325 MG PO TABS: 325.0000 mg | ORAL_TABLET | ORAL | Status: DC | PRN

## 2020-09-06 MED ORDER — ENSURE MAX PROTEIN PO LIQD: 11.0000 [oz_av] | Freq: Three times a day (TID) | ORAL | Status: DC

## 2020-09-06 MED ORDER — WITCH HAZEL-GLYCERIN EX PADS: MEDICATED_PAD | CUTANEOUS | 12 refills | Status: DC | PRN

## 2020-09-06 NOTE — Progress Notes (Signed)
Recreational Therapy Discharge Summary Patient Details  Name: Carl Hill MRN: [id number] Date of Birth: 06-20-1970 Today's Date: 09/06/2020  Long term goals set: 1  Long term goals met: 1  Comments on progress toward goals: TR sessions focused on activity tolerance, standing tolerance, standing balance, UE strengthening, discharge planning & coping strategies.  Pt is supervision/set up assist bed & seated levels for simple tasks. Pt independently directs his care and advocates for himself.    Reasons for discharge: treatment goals met  Patient/family agrees with progress made and goals achieved: Yes  Lore Polka 09/06/2020, 12:40 PM

## 2020-09-06 NOTE — Progress Notes (Signed)
Occupational Therapy Session Note  Patient Details  Name: Carl Hill MRN: [id number] Date of Birth: 16-Dec-1970  Today's Date: 09/06/2020 OT Individual Time:  -       Short Term Goals: Week 1:  OT Short Term Goal 1 (Week 1): Patient will tolerate sitting EOB for 15 minutes in preparation for BADL task OT Short Term Goal 1 - Progress (Week 1): Met OT Short Term Goal 2 (Week 1): Patient will complete sit<>stand with max A +2 in prep for transfer OT Short Term Goal 2 - Progress (Week 1): Progressing toward goal OT Short Term Goal 3 (Week 1): Patient will complete 50% of U Bbathing task while seated EOB. OT Short Term Goal 3 - Progress (Week 1): Met  Skilled Therapeutic Interventions/Progress Updates:    Pt participated in rhythmic drumming group. Pain in buttocks severe but unrated on scale. Pt self initiated rest breaks as needed to manage pain and repositioned at end of session for comfort on side. Focus of group on BUE coordination, strengthening, endurance, timing/control, activity tolerance, and social participation and engagement. Pt performs session from seated position for energy conservation. Skilled interventions included upgrading from last week to no coban use on LUE demoing improved grasp and no grip slips during activity. Warm up performed prior to exercises and UB stretching completed at end of group with demo from OT. Pt able to select preferred song to share with group: uptown funk. Returned pt to room at end of session. Exited session with pt seated in bed, exit alarm on and call light in reach  Therapy Documentation Precautions:  Precautions Precautions: Fall Precaution Comments: foley catheter, large buttocks wound, R heel wound Restrictions Weight Bearing Restrictions: No General:   Vital Signs: Therapy Vitals Temp: 97.8 F (36.6 C) Pulse Rate: 95 Resp: 17 BP: 123/63 Patient Position (if appropriate): Lying Oxygen Therapy SpO2: 100 % O2 Device: Room  Air Pain:   ADL: ADL Eating: Set up Grooming: Minimal assistance Upper Body Bathing: Moderate assistance Lower Body Bathing: Dependent Upper Body Dressing: Maximal assistance Lower Body Dressing: Dependent Toileting: Dependent Toilet Transfer: Unable to assess Gaffer Transfer: Unable to assess Vision   Perception    Praxis   Exercises:   Other Treatments:     Therapy/Group: Group Therapy  Tonny Branch 09/06/2020, 6:54 AM

## 2020-09-06 NOTE — Progress Notes (Signed)
PROGRESS NOTE   Subjective/Complaints:  Pt reports has an even larger stool ball- and they were unable to get it out last night- however per staff, a soap suds enema was ordered, and pt refused it. Explained to pt will do Mg citrate this afternoon and follow with soap suds- needs this type of enema to get him flushed out more.   Aslo, c/o back spasms and rectal spasms- will start Valium 5 mg q8 hours prn and B&O suppositories prn q8 hours for rectal spasms.   ROS:   Pt denies SOB, CP, N/V/, and vision changes   Objective:   No results found. No results for input(s): WBC, HGB, HCT, PLT in the last 72 hours. No results for input(s): NA, K, CL, CO2, GLUCOSE, BUN, CREATININE, CALCIUM in the last 72 hours.  Intake/Output Summary (Last 24 hours) at 09/06/2020 0855 Last data filed at 09/06/2020 0854 Gross per 24 hour  Intake 960 ml  Output 4001 ml  Net -3041 ml        Physical Exam: Vital Signs Blood pressure 123/63, pulse 95, temperature 97.8 F (36.6 C), resp. rate 17, height  (1.778 m), weight (!) 227.1 kg, SpO2 100 %.      General: awake, alert, appropriate, writhing intermittently due to spasms, crying intermittently as a result;  NAD; BMI down slightly to 71.83 HENT: conjugate gaze; oropharynx moist CV: regular rate; no JVD Pulmonary: CTA B/L; no W/R/R- good air movement GI: soft, NT, ND, (+)BS; normoactive BS; TTP but no rebound; c/o rectal/back spasms Psychiatric: appropriate but unhappy, which makes sense due to stool ball Neurological: alert Skin: -Sacral postop wound is more superficial;  the edges have contracted slightly so it is a little smaller and it is more pink as compared to beefy red.   Vascular changes bilateral lower extremities- no change Musc: No edema in extremities.  No tenderness in extremities. Motor: Bilateral upper extremities: 5/5 throughout, unchanged Bilateral lower extremities: Hip  flexion grossly 4+/5, distally 5/5, unchanged  Assessment/Plan: 1. Functional deficits which require 3+ hours per day of interdisciplinary therapy in a comprehensive inpatient rehab setting.  Physiatrist is providing close team supervision and 24 hour management of active medical problems listed below.  Physiatrist and rehab team continue to assess barriers to discharge/monitor patient progress toward functional and medical goals  Care Tool:  Bathing    Body parts bathed by patient: Right arm,Left arm,Chest,Abdomen,Front perineal area,Right upper leg,Left upper leg,Right lower leg,Left lower leg,Face   Body parts bathed by helper: Buttocks     Bathing assist Assist Level: Minimal Assistance - Patient > 75%     Upper Body Dressing/Undressing Upper body dressing   What is the patient wearing?: Pull over shirt    Upper body assist Assist Level: Supervision/Verbal cueing    Lower Body Dressing/Undressing Lower body dressing      What is the patient wearing?: Incontinence brief     Lower body assist Assist for lower body dressing: Maximal Assistance - Patient 25 - 49%     Toileting Toileting    Toileting assist Assist for toileting: Dependent - Patient 0%     Transfers Chair/bed transfer  Transfers assist  Chair/bed transfer activity did not occur: Safety/medical concerns  Chair/bed transfer assist level: Moderate Assistance - Patient 50 - 74% (due to assist needed w/sit to stand)     Locomotion Ambulation   Ambulation assist   Ambulation activity did not occur: Safety/medical concerns  Assist level: Contact Guard/Touching assist Assistive device: Walker-rolling Max distance: 60   Walk 10 feet activity   Assist  Walk 10 feet activity did not occur: Safety/medical concerns  Assist level: Contact Guard/Touching assist Assistive device: Walker-rolling   Walk 50 feet activity   Assist Walk 50 feet with 2 turns activity did not occur:  (straight path  only)  Assist level: Contact Guard/Touching assist Assistive device: Walker-rolling    Walk 150 feet activity   Assist Walk 150 feet activity did not occur: Safety/medical concerns         Walk 10 feet on uneven surface  activity   Assist Walk 10 feet on uneven surfaces activity did not occur: Safety/medical concerns         Wheelchair     Assist Will patient use wheelchair at discharge?: Yes Type of Wheelchair: Manual Wheelchair activity did not occur: Safety/medical concerns  Wheelchair assist level: Supervision/Verbal cueing Max wheelchair distance: 100'    Wheelchair 50 feet with 2 turns activity    Assist    Wheelchair 50 feet with 2 turns activity did not occur: Safety/medical concerns   Assist Level: Supervision/Verbal cueing   Wheelchair 150 feet activity     Assist  Wheelchair 150 feet activity did not occur: Safety/medical concerns       Blood pressure 123/63, pulse 95, temperature 97.8 F (36.6 C), resp. rate 17, height 5\' 10"  (1.778 m), weight (!) 227.1 kg, SpO2 100 %.  Medical Problem List and Plan: 1.  Debility secondary to septic shock from necrotizing soft tissue infection of perianal, gluteal, and perineal tissues.  3/28- con't PT and PT- progressing- walked 100 ft this weekend, total- see if pt can get into shower?- Will change dressing afterwards 3/29- walked 45 ft at once yesterday- doing shower today- did dry run as well- con't PT and OT  -4/2- con't PT and OT- will see if can sit him up at E[facility] to eat- but cannot address utnil I speak with Cheri GuppyJennifer Bailey  4/6-continue PT and OT-we will discuss with team if we can extend him a little more-as of tomorrow 4 7 he will be here 30-days.  4/8- con't PT and OT- explained to pt he's already been here 30 days- that we need to complete what's needed to get him home, but to keep him, just to keep him, isn't appropriate, we need to make sure will make further functional gains- he wasn't  happy with this, but I explained patients don't stay in CIR >30 days, because we are an ACUTE Rehab facility, not subacute.   2.  Antithrombotics: -DVT/anticoagulation:  Pharmaceutical:   Xarelto             -antiplatelet therapy: N/A 3. Pain Management:  Continue Lyrica and elavil for neuropathy.              Oxycontin 15 mg bid with 10 mg prn for breakthrough pain.   On robaxin 750 mg q6 hours prn  3/29- pain controlled on current regimen- con't regimen- will need to discuss with pt, will likely need to reduce pain meds prior to d/c to get it covered by insurance.   3/30- d/w pt- will not be able to send home  on three opiates- asked him to think about it.   4/1- will d/w pt again next week- con't pain regimen for now 4/2-discussed at length oxycodone versus Dilaudid for home regimen; will change  his OxyContin to MS Contin 15 mg twice a day; will wait on changing the Dilaudid versus oxycodone. Also added kpad for pain.   4/3-patient did not report any change in his overall pain with transition to MS Contin; continue regimen  4/5- having anal pain- from constant BMs- con't regimen  4/6-discussed using B and O suppositories for anal cramping when cleaning them out again over this weekend-also discussed using as little pain medicines as possible to try to reduce constipation.  4/8- added B&O suppositories- can increase frequency if needed, but keep prn; help rectal pain and don't cause constipation they same; also added Valium 5 mg q8 hours prn- also explained is NOT going home on Valium.  4. Mood: LCSW to follow for evaluation and support.              -antipsychotic agents: N/A 5. Neuropsych: This patient is capable of making decisions on his own behalf. 6. Skin/Wound Care: Air mattress for pressure relief measures.              --will need local care frequently to keep sacral wound bed clean.             --Continue foley for now.  Added hemorrhoidal cream to assist with any pain which might  be ano-rectal/hemorrhoidal  4/2-postop wound is healing; continue regimen  4/3-still requires the hemorrhoidal cream for anal pain  4/7- changed Anusol and tucks to after every BM 7. Fluids/Electrolytes/Nutrition: Monitor I/Os.  8. Necrotizing fascitis: Has been on Augmentin since 02/11--> to continue for 6 months per ID 9. T2DM with neuropathy: Hgb A1c- 9.6 (down from 13 in Aug) Will continue to monitor BS ac/hs and use SSI for elevated BS             --used Farxiga, metformin and Victoza 1.8.             --Continue Levemir bid and titrate as indicated  -decreased novolog to 10 units TID with meals   4/2-blood sugars well controlled; con't regimen  4/3-had an episode of a blood sugar of 226 yesterday; otherwise his blood sugars are running from 85- 130- con't regimen- that elevation is rare for him.  4/8- BGs 103-168- con't regimen 10 Hyponatremia:              Sodium 138 on 3/21, labs ordered for Monday 11. Hypokalemia:    Potassium 3.8 on 3/21  4/3-we will recheck his labs in the morning  4/5- K+ 3.8- con't regimen 12. Proximal A FIb: Monitor HR tid--continue Cardizem 240 mg and amiodarone for rate control.              --monitor for symptoms with increase in activity.  4/1- controlled- con't regimen 13. OIC:              Discontinued colace  4/2- will Give Sorbitol 60 ml and see if will poop- if not, will need to do Mg citrate again.    4/3-had multiple bowel movements yesterday-will add MiraLAX back scheduled  Improved   4/5- had enema yesterday and having constant BMs since then- will check another KUB to see how stool burden is doing  4/6-patient is still full of stool according to the latest KUB-discussed with patient waiting till this weekend when he did  not have therapy to clean him out further-increased his MiraLAX as well as added Senokot 1 tab twice daily-do not want to do today or tomorrow because we do not want to miss more therapy if we can help  4/7- large BM last night-  will get cleaned out more starting Friday/Saturday.   4/8- has large stool ball- refused soap suds enema- needs to get cleaned out- ordered Mg citrate today and followed by soap suds- they can change type of enema, but will need it.  14. Chronic right heel ulcer: Continue to paint wound daily with iodine.   3/16- is Stage II- healing  3/29- added vaseline daily- less, but still has peeling skin- asked him to scrub in shower today  3/30- pt scrubbed in shower- will con't to monitor- actual wound appears closed now.   3/31- resolved- will d/c wound care orders and place vaseline order nightly to B/L feet and cover with socks  4/1- asked nursing to put on Vaseline on feet before bed- placed order 15.  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 Will need bariatric equipment.  3/28- BMI down to 72  4/1- weight down to 227.6 kg- so down slightly- BMI down to 72.0 form 72.4- con't regimen\  4/2-patient's goal is to be 499 pounds by discharge-he really wants to go home on his current diabetic regimen and he actually wants to go home eating the meals he has eaten in the hospital.  Encouraged this.  4/8- BMI down slightly to 71.83 16. Trach site  3/15- resolved/healed 17. Foley for urinary retention  3/23- pt wants to go home with foley- will need f/u with Urology to get out- since bladder, being a muscle, will be weak. Will need bariatric BSC if at possible.  3/31- pt foley is required due to wounds and urinary retention- con't foley and monitor- con't [facility] baths   4/2-explained if patient cannot get home health, he will need to go to the urologist to get the Foley changed once a month.  4/6-we will not be able to get home health therapy or nursing- so will need to set up with urology to make sure he can get his Foley changed monthly 18. Muscle spasms  See #3  3/29- doing better on Robaxin- con't regimen  4/2-mother feels that patient is back should be imaged with an MRI due to muscle spasms-I  explained the muscle spasms are common when someone is working harder than they have for a while.  Also it is not midline back pain; do not think an MRI is necessary at this time.  19. Disposition:   At baseline, patient received significant assistance from his mother, aged 58. He required her assistance to wipe stool after BM due to body habitus. He was able to ambulate to bathroom and back on his own. Plan is for discharge home to father, who is a IT sales professional, and who can provide more physical support to patient. Goal is ModA level for ambulation within his room.   3/22- pt insists going home with mother- says she can do wound care better- will go to fathers when mother has surgery.    3/23- mother's surgery on 4/29  3/30- moved d/c date to 4/12 per therapy.   4/7- will see if we need ot keep longer to get home safely- every pt can benefit from staying longer- but what's needed to get pt home.   4/8- will see next Monday if needs to be extended- base don d/w therapy,  I'm not sure he will, to get home safely.     LOS: 31 days A FACE TO FACE EVALUATION WAS PERFORMED  Carl Hill 09/06/2020, 8:55 AM

## 2020-09-06 NOTE — Progress Notes (Signed)
Patient ID: Carl Hill, male   DOB: 31-Jul-1970, 50 y.o.   MRN: [id number]   SW ordered bariatric 3in1 BSC with [employer] via parachute.  Cecile Sheerer, MSW, LCSWA Office: [phone] Cell: [phone] Fax: [phone]

## 2020-09-06 NOTE — Progress Notes (Signed)
SSE administered and monitor,patient resting on  Left sde approx 500 cc enema administered  2130 Additional SSE provided approximately 250-300 cc, well, ADL care provided, small amt stool noted  09/07/20 at 0725 Upon rounding patient states he is expelling more stool, 2-3 large hard lumps stool, incontinent care provided. Patient request for completion if enema be provide, this was done and tolerated well by patient. Resting at this time on left side/

## 2020-09-06 NOTE — Progress Notes (Signed)
Occupational Therapy Weekly Progress Note  Patient Details  Name: CHRISTIFER CHAPDELAINE MRN: [id number] Date of Birth: 01/07/1971  Beginning of progress report period: August 29, 2020 End of progress report period: September 06, 2020  Patient has met 3 of 3 short term goals.  Pt made steady progress with BADLs during the past week. Bed mobility, UB bahting/dressing with supervision. Sit<>stand and functional tranfsers with supervision/CGA and A to steady RW with sit<>stand. Pt's mother has been present during therapy sessions.  Pt is independent with directing care.   Patient continues to demonstrate the following deficits: muscle weakness, decreased cardiorespiratoy endurance, and decreased sitting balance, decreased standing balance, decreased postural control, and decreased balance strategies and therefore will continue to benefit from skilled OT intervention to enhance overall performance with BADL and iADL.  Patient progressing toward long term goals..  Continue plan of care.   OT Short Term Goals Week 4:  OT Short Term Goal 1 - Progress (Week 4): Met OT Short Term Goal 2 (Week 4): Pt will thread pants over BLE using AE PRN OT Short Term Goal 2 - Progress (Week 4): Met OT Short Term Goal 3 (Week 4): Pt will perform stand pivot transfers to [facility] with min A OT Short Term Goal 3 - Progress (Week 4): Met OT Short Term Goal 4 (Week 4): Pt will perform toileting tasks with max A Week 5:  OT Short Term Goal 1 (Week 5): STG=LTG 2/2 ELOS   Leotis Shames [facility] 09/06/2020, 6:41 AM

## 2020-09-06 NOTE — Progress Notes (Signed)
Occupational Therapy Note  Patient Details  Name: Carl Hill MRN: [id number] Date of Birth: 1970/09/05  Today's Date: 09/06/2020 OT Missed Time: 45 Minutes Missed Time Reason: Pain  Pt missed 45 mins skilled OT services 2/2 pain (back and rectal spasms). RN aware. Will attempt later.   Lavone Neri [facility] 09/06/2020, 1:26 PM

## 2020-09-06 NOTE — Progress Notes (Signed)
Physical Therapy Session Note  Patient Details  Name: Carl Hill MRN: [id number] Date of Birth: Jun 12, 1970  Today's Date: 09/06/2020 PT Individual Time:  -    Pt missed time of 60 min.  Short Term Goals: Week 4:  PT Short Term Goal 1 (Week 4): Pt will ambulate x 100 ft with RW and CGA x 2 PT Short Term Goal 1 - Progress (Week 4): Progressing toward goal PT Short Term Goal 2 (Week 4): Pt will maintain dynamic standing balance x 2 min with LRAD and CGA x 2. PT Short Term Goal 2 - Progress (Week 4): Met PT Short Term Goal 3 (Week 4): Pt will self propel w/c with supervision x 300 ft. PT Short Term Goal 3 - Progress (Week 4): Progressing toward goal  Skilled Therapeutic Interventions/Progress Updates: Pt presents sidelying in bed and in pain from impaction and LB spasms.  Pt yelling out in pain and unable to participate in therapy.  Family present.  Pt remained in bed w/ all needs in reach.     Therapy Documentation Precautions:  Precautions Precautions: Fall Precaution Comments: foley catheter, large buttocks wound, R heel wound Restrictions Weight Bearing Restrictions: No General: PT Amount of Missed Time (min): 60 Minutes PT Missed Treatment Reason: Pain Vital Signs: Therapy Vitals Temp: 98.3 F (36.8 C) Temp Source: Oral Pulse Rate: 100 Resp: 18 BP: (!) 116/57 Patient Position (if appropriate): Lying Oxygen Therapy SpO2: 94 % O2 Device: Room Air Pain: 10/10 pain to LB and 2/2 impaction.      Therapy/Group: Individual Therapy  Ladoris Gene 09/06/2020, 2:36 PM

## 2020-09-06 NOTE — Discharge Instructions (Addendum)
Opioid Overdose Opioids are drugs that are often used to treat pain. Opioids include illegal drugs, such as heroin, as well as prescription pain medicines, such as codeine, morphine, hydrocodone, oxycodone, and fentanyl. An opioid overdose happens when you take too much of an opioid. An overdose may be intentional or accidental and can happen with any type of opioid. The effects of an overdose can be mild, dangerous, or even deadly. Opioid overdose is a medical emergency. What are the causes? This condition may be caused by:  Taking too much of an opioid on purpose.  Taking too much of an opioid by accident.  Using two or more substances that contain opioids at the same time.  Taking an opioid with a substance that affects your heart, breathing, or blood pressure. These include alcohol, tranquilizers, sleeping pills, illegal drugs, and some over-the-counter medicines. This condition may also happen due to an error made by:  A health care provider who prescribes a medicine.  The pharmacist who fills the prescription order. What increases the risk? This condition is more likely in:  Children. They may be attracted to colorful pills. Because of a child's small size, even a small amount of a drug can be dangerous.  Older people. They may be taking many different drugs. Older people may have difficulty reading labels or remembering when they last took their medicine. They may also be more sensitive to the effects of opioids.  People with chronic medical conditions, especially heart, liver, kidney, or neurological diseases.  People who take an opioid for a long period of time.  People who use: ? Illegal drugs. IV heroin is especially dangerous. ? Other substances, including alcohol, while using an opioid.  People who have: ? A history of drug or alcohol abuse. ? Certain mental health conditions. ? A history of previous drug overdoses.  People who take opioids that are not  prescribed for them. What are the signs or symptoms? Symptoms of this condition depend on the type of opioid and the amount that was taken. Common symptoms include:  Sleepiness or difficulty waking from sleep.  Decrease in attention.  Confusion.  Slurred speech.  Slowed breathing and a slow pulse (bradycardia).  Nausea and vomiting.  Abnormally small pupils. Signs and symptoms that require emergency treatment include:  Cold, clammy, and pale skin.  Blue lips and fingernails.  Vomiting.  Gurgling sounds in the throat.  A pulse that is very slow or difficult to detect.  Breathing that is very irregular, slow, noisy, or difficult to detect.  Limp body.  Inability to respond to speech or be awakened from sleep (stupor).  Seizures. How is this diagnosed? This condition is diagnosed based on your symptoms and medical history. It is important to tell your health care provider:  About all of the opioids that you took.  When you took the opioids.  Whether you were drinking alcohol or using marijuana, cocaine, or other drugs. Your health care provider will do a physical exam. This exam may include:  Checking and monitoring your heart rate and rhythm, breathing rate, temperature, and blood pressure (vital signs).  Measuring oxygen levels in your blood.  Checking for abnormally small pupils. You may also have blood tests or urine tests. You may have X-rays if you are having severe breathing problems. How is this treated? This condition requires immediate medical treatment and hospitalization. Treatment is given in the hospital intensive care (ICU) setting. Supporting your vital signs and your breathing is the first step  in treating an opioid overdose. Treatment may also include:  Giving salts and minerals (electrolytes) along with fluids through an IV.  Inserting a breathing tube (endotracheal tube) in your airway to help you breathe if you cannot breathe on your own or  you are in danger of not being able to breathe on your own.  Giving oxygen through a small tube under your nose.  Passing a tube through your nose and into your stomach (nasogastric tube, or NG tube) to empty your stomach.  Giving medicines that: ? Increase your blood pressure. ? Relieve nausea and vomiting. ? Relieve abdominal pain and cramping. ? Reverse the effects of the opioid (naloxone).  Monitoring your heart and oxygen levels.  Ongoing counseling and mental health support if you intentionally overdosed or used an illegal drug. Follow these instructions at home: Medicines  Take over-the-counter and prescription medicines only as told by your health care provider.  Always ask your health care provider about possible side effects and interactions of any new medicine that you start taking.  Keep a list of all the medicines that you take, including over-the-counter medicines. Bring this list with you to all your medical visits. General instructions  Drink enough fluid to keep your urine pale yellow.  Keep all follow-up visits as told by your health care provider. This is important.   How is this prevented?  Read the drug inserts that come with your opioid pain medicines.  Take medicines only as told by your health care provider. Do not take more medicine than you are told. Do not take medicines more frequently than you are told.  Do not drink alcohol or take sedatives when taking opioids.  Do not use illegal or recreational drugs, including cocaine, ecstasy, and marijuana.  Do not take opioid medicines that are not prescribed for you.  Store all medicines in safety containers that are out of the reach of children.  Get help if you are struggling with: ? Alcohol or drug use. ? Depression or another mental health problem. ? Thoughts of hurting yourself or another person.  Keep the phone number of your local poison control center near your phone or in your mobile phone.  In the [place] the hotline of the [facility] is 680-053-7513.  If you were prescribed naloxone, make sure you understand how to take it. Contact a health care provider if you:  Need help understanding how to take your pain medicines.  Feel your medicines are too strong.  Are concerned that your pain medicines are not working well for your pain.  Develop new symptoms or side effects when you are taking medicines. Get help right away if:  You or someone else is having symptoms of an opioid overdose. Get help even if you are not sure.  You have serious thoughts about hurting yourself or others.  You have: ? Chest pain. ? Difficulty breathing. ? A loss of consciousness. These symptoms may represent a serious problem that is an emergency. Do not wait to see if the symptoms will go away. Get medical help right away. Call your local emergency services (911 in the [place] Do not drive yourself to the hospital. If you ever feel like you may hurt yourself or others, or have thoughts about taking your own life, get help right away. You can go to your nearest emergency department or call:  Your local emergency services (911 in the [place]  A suicide crisis helpline, such as the National Suicide Prevention  Lifeline at 628-608-7622. This is open 24 hours a day. Summary  Opioids are drugs that are often used to treat pain. Opioids include illegal drugs, such as heroin, as well as prescription pain medicines.  An opioid overdose happens when you take too much of an opioid.  Overdoses can be intentional or accidental.  Opioid overdose is very dangerous. It is a life-threatening emergency.  If you or someone you know is experiencing an opioid overdose, get help right away. This information is not intended to replace advice given to you by your health care provider. Make sure you discuss any questions you have with your health care provider. Document Revised: 05/05/2018  Document Reviewed: 05/05/2018 [employer] Patient Education  2021 [employer] Inpatient Rehab Discharge Instructions  [facility] Discharge date and time:  09/12/20 Activities/Precautions/ Functional Status: Activity: no lifting, driving, or strenuous exercise till cleared by MD Diet: low fat, low cholesterol diet Wound Care: Wash area with soap and water, pat dry and apply damp to dry dressing. Change twice a day and more frequently if soiled.  Functional status:  ___ No restrictions     ___ Walk up steps independently _X__ 24/7 supervision/assistance   ___ Walk up steps with assistance ___ Intermittent supervision/assistance  ___ Bathe/dress independently ___ Walk with walker     __X_ Bathe/dress with assistance ___ Walk Independently    ___ Shower independently ___ Walk with assistance    ___ Shower with assistance _X__ No alcohol     ___ Return to work/school ________   COMMUNITY REFERRALS UPON DISCHARGE:    Home Health:   PT      RN   SNA                 Agency: [facility]    Phone: [phone] *Please expect follow-up within 2-3 days of discharge to schedule your home visit. If you have not received follow-up, be sure to contact the branch directly.*   Medical Equipment/Items Ordered: bariatric 3in1 bedside commode                                                 Agency/Supplier: [facility] [phone]  Special Instructions:    My questions have been answered and I understand these instructions. I will adhere to these goals and the provided educational materials after my discharge from the hospital.  Patient/Caregiver Signature _______________________________ Date __________  Clinician Signature _______________________________________ Date __________  Please bring this form and your medication list with you to all your follow-up doctor's appointments. Information on my medicine - XARELTO (Rivaroxaban)  This medication education was reviewed with me  or my healthcare representative as part of my discharge preparation.  The pharmacist that spoke with me during my hospital stay was:  Ulyses Southward, RPH-CPP  Why was Xarelto prescribed for you? Xarelto was prescribed for you to reduce the risk of a blood clot forming that can cause a stroke if you have a medical condition called atrial fibrillation (a type of irregular heartbeat).  What do you need to know about xarelto ? Take your Xarelto ONCE DAILY at the same time every day with your evening meal. If you have difficulty swallowing the tablet whole, you may crush it and mix in applesauce just prior to taking your dose.  Take Xarelto exactly as prescribed by your doctor and DO NOT  stop taking Xarelto without talking to the doctor who prescribed the medication.  Stopping without other stroke prevention medication to take the place of Xarelto may increase your risk of developing a clot that causes a stroke.  Refill your prescription before you run out.  After discharge, you should have regular check-up appointments with your healthcare provider that is prescribing your Xarelto.  In the future your dose may need to be changed if your kidney function or weight changes by a significant amount.  What do you do if you miss a dose? If you are taking Xarelto ONCE DAILY and you miss a dose, take it as soon as you remember on the same day then continue your regularly scheduled once daily regimen the next day. Do not take two doses of Xarelto at the same time or on the same day.   Important Safety Information A possible side effect of Xarelto is bleeding. You should call your healthcare provider right away if you experience any of the following: ? Bleeding from an injury or your nose that does not stop. ? Unusual colored urine (red or dark brown) or unusual colored stools (red or black). ? Unusual bruising for unknown reasons. ? A serious fall or if you hit your head (even if there is no  bleeding).  Some medicines may interact with Xarelto and might increase your risk of bleeding while on Xarelto. To help avoid this, consult your healthcare provider or pharmacist prior to using any new prescription or non-prescription medications, including herbals, vitamins, non-steroidal anti-inflammatory drugs (NSAIDs) and supplements.  This website has more information on Xarelto: [email or web address].  COMMUNITY REFERRALS UPON DISCHARGE:    Home Health:   PT      RN   SNA                Agency: [facility]    Phone: *Please expect follow-up within 2-3 days to schedule your home visit. If you have not received follow-up, be sure to contact the branch directly.*   Medical Equipment/Items Ordered:                                                 Agency/Supplier:

## 2020-09-07 LAB — GLUCOSE, CAPILLARY
Glucose-Capillary: 132 mg/dL — ABNORMAL HIGH (ref 70–99)
Glucose-Capillary: 132 mg/dL — ABNORMAL HIGH (ref 70–99)
Glucose-Capillary: 110 mg/dL — ABNORMAL HIGH (ref 70–99)
Glucose-Capillary: 150 mg/dL — ABNORMAL HIGH (ref 70–99)
Glucose-Capillary: 122 mg/dL — ABNORMAL HIGH (ref 70–99)

## 2020-09-07 NOTE — Plan of Care (Signed)
  Problem: Consults Goal: Skin Care Protocol Initiated - if Braden Score 18 or less Description: WOC following patient Pt will understand plan of care for wounds Outcome: Progressing Goal: Diabetes Guidelines if Diabetic/Glucose > 140 Description: If diabetic or lab glucose is > 140 mg/dl - Initiate Diabetes/Hyperglycemia Guidelines & Document Interventions  Pt will understand diet, medications, and need for f/u care with min cues Outcome: Progressing   Problem: RH BOWEL ELIMINATION Goal: RH STG MANAGE BOWEL WITH ASSISTANCE Description: STG Manage Bowel with min Assistance. Outcome: Progressing   Problem: RH BLADDER ELIMINATION Goal: RH STG MANAGE BLADDER WITH EQUIPMENT WITH ASSISTANCE Description: STG Manage Bladder With  foley catheter Equipment With min Assistance Outcome: Progressing   Problem: RH SKIN INTEGRITY Goal: RH STG SKIN FREE OF INFECTION/BREAKDOWN Description: Pt will have improvement of current wounds no further breakdown or infection while on rehab with min assist Outcome: Progressing Goal: RH STG MAINTAIN SKIN INTEGRITY WITH ASSISTANCE Description: STG Maintain Skin Integrity With min Assistance. Outcome: Progressing Goal: RH STG ABLE TO PERFORM INCISION/WOUND CARE W/ASSISTANCE Description: STG Able To Perform Incision/Wound Care With max Assistance. Outcome: Progressing   Problem: RH SAFETY Goal: RH STG ADHERE TO SAFETY PRECAUTIONS W/ASSISTANCE/DEVICE Description: STG Adhere to Safety Precautions With min Assistance/Device. Outcome: Progressing Goal: RH STG DECREASED RISK OF FALL WITH ASSISTANCE Description: STG Decreased Risk of Fall With min Assistance. Outcome: Progressing   Problem: RH PAIN MANAGEMENT Goal: RH STG PAIN MANAGED AT OR BELOW PT'S PAIN GOAL Description: Pt will have pain that is <= 4/10 Outcome: Progressing   Problem: RH KNOWLEDGE DEFICIT GENERAL Goal: RH STG INCREASE KNOWLEDGE OF SELF CARE AFTER HOSPITALIZATION Description: Patient  will demonstrate knowledge of medication management, dietary management, diabetes management, skin/wound care management with educational materials and handouts provided by staff. Outcome: Progressing   Problem: Consults Goal: RH GENERAL PATIENT EDUCATION Description: See Patient Education module for education specifics. Outcome: Progressing   

## 2020-09-07 NOTE — Progress Notes (Signed)
Resting in bed states he feels much better "now" that I has this BM this morning,states generalized discomfort of pain, provided schedule medication patient states that was enough for now, incontinent care for bowel provided, foley patent. Made as comfortable as possible,   -

## 2020-09-07 NOTE — Progress Notes (Signed)
PROGRESS NOTE   Subjective/Complaints:  Very large bowel movement this morning, patient took a photograph to show  Patient also showed sequential pictures of his perirectal wound progressive improvement from February until last week  ROS:   Pt denies SOB, CP, N/V/, and vision changes   Objective:   No results found. No results for input(s): WBC, HGB, HCT, PLT in the last 72 hours. No results for input(s): NA, K, CL, CO2, GLUCOSE, BUN, CREATININE, CALCIUM in the last 72 hours.  Intake/Output Summary (Last 24 hours) at 09/07/2020 1243 Last data filed at 09/07/2020 1130 Gross per 24 hour  Intake 600 ml  Output 5850 ml  Net -5250 ml        Physical Exam: Vital Signs Blood pressure (!) 155/76, pulse 98, temperature 97.7 F (36.5 C), resp. rate 17, height 5\' 10"  (1.778 m), weight (!) 227.1 kg, SpO2 97 %.  General: No acute distress Mood and affect are appropriate Heart: Regular rate and rhythm no rubs murmurs or extra sounds Lungs: Clear to auscultation, breathing unlabored, no rales or wheezes Abdomen: Positive bowel sounds, soft nontender to palpation, nondistended Extremities: No clubbing, cyanosis, or edema   Neurological: alert  Musc: No edema in extremities.  No tenderness in extremities. Motor: Bilateral upper extremities: 5/5 throughout, unchanged Bilateral lower extremities: Hip flexion grossly 4+/5, distally 5/5, unchanged  Assessment/Plan: 1. Functional deficits which require 3+ hours per day of interdisciplinary therapy in a comprehensive inpatient rehab setting.  Physiatrist is providing close team supervision and 24 hour management of active medical problems listed below.  Physiatrist and rehab team continue to assess barriers to discharge/monitor patient progress toward functional and medical goals  Care Tool:  Bathing    Body parts bathed by patient: Right arm,Left arm,Chest,Abdomen,Front  perineal area,Right upper leg,Left upper leg,Right lower leg,Left lower leg,Face   Body parts bathed by helper: Buttocks     Bathing assist Assist Level: Minimal Assistance - Patient > 75%     Upper Body Dressing/Undressing Upper body dressing   What is the patient wearing?: Pull over shirt    Upper body assist Assist Level: Supervision/Verbal cueing    Lower Body Dressing/Undressing Lower body dressing      What is the patient wearing?: Incontinence brief     Lower body assist Assist for lower body dressing: Maximal Assistance - Patient 25 - 49%     Toileting Toileting    Toileting assist Assist for toileting: Dependent - Patient 0%     Transfers Chair/bed transfer  Transfers assist  Chair/bed transfer activity did not occur: Safety/medical concerns  Chair/bed transfer assist level: Moderate Assistance - Patient 50 - 74% (due to assist needed w/sit to stand)     Locomotion Ambulation   Ambulation assist   Ambulation activity did not occur: Safety/medical concerns  Assist level: Contact Guard/Touching assist Assistive device: Walker-rolling Max distance: 60   Walk 10 feet activity   Assist  Walk 10 feet activity did not occur: Safety/medical concerns  Assist level: Contact Guard/Touching assist Assistive device: Walker-rolling   Walk 50 feet activity   Assist Walk 50 feet with 2 turns activity did not occur:  (straight path only)  Assist  level: Contact Guard/Touching assist Assistive device: Walker-rolling    Walk 150 feet activity   Assist Walk 150 feet activity did not occur: Safety/medical concerns         Walk 10 feet on uneven surface  activity   Assist Walk 10 feet on uneven surfaces activity did not occur: Safety/medical concerns         Wheelchair     Assist Will patient use wheelchair at discharge?: Yes Type of Wheelchair: Manual Wheelchair activity did not occur: Safety/medical concerns  Wheelchair assist  level: Supervision/Verbal cueing Max wheelchair distance: 100'    Wheelchair 50 feet with 2 turns activity    Assist    Wheelchair 50 feet with 2 turns activity did not occur: Safety/medical concerns   Assist Level: Supervision/Verbal cueing   Wheelchair 150 feet activity     Assist  Wheelchair 150 feet activity did not occur: Safety/medical concerns       Blood pressure (!) 155/76, pulse 98, temperature 97.7 F (36.5 C), resp. rate 17, height 5\' 10"  (1.778 m), weight (!) 227.1 kg, SpO2 97 %.  Medical Problem List and Plan: 1.  Debility secondary to septic shock from necrotizing soft tissue infection of perianal, gluteal, and perineal tissues.  3/28- con't PT and PT- progressing- walked 100 ft this weekend, total- see if pt can get into shower?- Will change dressing afterwards 3/29- walked 45 ft at once yesterday- doing shower today- did dry run as well- con't PT and OT  -4/2- con't PT and OT- will see if can sit him up at EOB to eat- but cannot address utnil I speak with 4/29  4/6-continue PT and OT-we will discuss with team if we can extend him a little more-as of tomorrow 4 7 he will be here 30-days.  4/8- con't PT and OT- explained to pt he's already been here 30 days- that we need to complete what's needed to get him home, but to keep him, just to keep him, isn't appropriate, we need to make sure will make further functional gains- he wasn't happy with this, but I explained patients don't stay in CIR >30 days, because we are an ACUTE Rehab facility, not subacute.   Tentative discharge 4/12 will not be having home health services 2.  Antithrombotics: -DVT/anticoagulation:  Pharmaceutical:   Xarelto             -antiplatelet therapy: N/A 3. Pain Management:  Continue Lyrica and elavil for neuropathy.              Oxycontin 15 mg bid with 10 mg prn for breakthrough pain.   On robaxin 750 mg q6 hours prn  3/29- pain controlled on current regimen- con't regimen-  will need to discuss with pt, will likely need to reduce pain meds prior to d/c to get it covered by insurance.   3/30- d/w pt- will not be able to send home on three opiates- asked him to think about it.   4/1- will d/w pt again next week- con't pain regimen for now 4/2-discussed at length oxycodone versus Dilaudid for home regimen; will change  his OxyContin to MS Contin 15 mg twice a day; will wait on changing the Dilaudid versus oxycodone. Also added kpad for pain.   4/3-patient did not report any change in his overall pain with transition to MS Contin; continue regimen  4/5- having anal pain- from constant BMs- con't regimen  4/6-discussed using B and O suppositories for anal cramping when cleaning them  out again over this weekend-also discussed using as little pain medicines as possible to try to reduce constipation.  4/8- added B&O suppositories- can increase frequency if needed, but keep prn; help rectal pain and don't cause constipation they same; also added Valium 5 mg q8 hours prn- also explained is NOT going home on Valium.  Will discontinue 4. Mood: LCSW to follow for evaluation and support.              -antipsychotic agents: N/A 5. Neuropsych: This patient is capable of making decisions on his own behalf. 6. Skin/Wound Care: Air mattress for pressure relief measures.              --will need local care frequently to keep sacral wound bed clean.             --Continue foley for now.  Added hemorrhoidal cream to assist with any pain which might be ano-rectal/hemorrhoidal  4/2-postop wound is healing; continue regimen  4/3-still requires the hemorrhoidal cream for anal pain  4/7- changed Anusol and tucks to after every BM 7. Fluids/Electrolytes/Nutrition: Monitor I/Os.  8. Necrotizing fascitis: Has been on Augmentin since 02/11--> to continue for 6 months per ID 9. T2DM with neuropathy: Hgb A1c- 9.6 (down from 13 in Aug) Will continue to monitor BS ac/hs and use SSI for elevated BS              --used Farxiga, metformin and Victoza 1.8.             --Continue Levemir bid and titrate as indicated  -decreased novolog to 10 units TID with meals    CBG (last 3)  Recent Labs    09/06/20 2007 09/07/20 0624 09/07/20 1128  GLUCAP 176* 132* 132*  Controlled 4/9  10 Hyponatremia:              Sodium 138 on 3/21, labs ordered for Monday 11. Hypokalemia:    Potassium 3.8 on 3/21  4/3-we will recheck his labs in the morning  4/5- K+ 3.8- con't regimen 12. Proximal A FIb: Monitor HR tid--continue Cardizem 240 mg and amiodarone for rate control.              --monitor for symptoms with increase in activity.  4/1- controlled- con't regimen 13. OIC:              Discontinued colace  4/2- will Give Sorbitol 60 ml and see if will poop- if not, will need to do Mg citrate again.    4/3-had multiple bowel movements yesterday-will add MiraLAX back scheduled  Improved   4/5- had enema yesterday and having constant BMs since then- will check another KUB to see how stool burden is doing  4/6-patient is still full of stool according to the latest KUB-discussed with patient waiting till this weekend when he did not have therapy to clean him out further-increased his MiraLAX as well as added Senokot 1 tab twice daily-do not want to do today or tomorrow because we do not want to miss more therapy if we can help  4/7- large BM last night- will get cleaned out more starting Friday/Saturday.   4/8- has large stool ball- refused soap suds enema- needs to get cleaned out- ordered Mg citrate today and followed by soap suds- they can change type of enema, but will need it. Large bowel movement on 4/9 14. Chronic right heel ulcer: Continue to paint wound daily with iodine.   3/16- is Stage II- healing  3/29-  added vaseline daily- less, but still has peeling skin- asked him to scrub in shower today  3/30- pt scrubbed in shower- will con't to monitor- actual wound appears closed now.   3/31-  resolved- will d/c wound care orders and place vaseline order nightly to B/L feet and cover with socks  4/1- asked nursing to put on Vaseline on feet before bed- placed order 15.  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 Will need bariatric equipment.  3/28- BMI down to 72  4/1- weight down to 227.6 kg- so down slightly- BMI down to 72.0 form 72.4- con't regimen\  4/2-patient's goal is to be 499 pounds by discharge-he really wants to go home on his current diabetic regimen and he actually wants to go home eating the meals he has eaten in the hospital.  Encouraged this.  4/8- BMI down slightly to 71.83 16. Trach site  3/15- resolved/healed 17. Foley for urinary retention  3/23- pt wants to go home with foley- will need f/u with Urology to get out- since bladder, being a muscle, will be weak. Will need bariatric BSC if at possible.  3/31- pt foley is required due to wounds and urinary retention- con't foley and monitor- con't [facility] baths   4/2-explained if patient cannot get home health, he will need to go to the urologist to get the Foley changed once a month.  4/6-we will not be able to get home health therapy or nursing- so will need to set up with urology to make sure he can get his Foley changed monthly 18. Muscle spasms  See #3  3/29- doing better on Robaxin- con't regimen  4/2-mother feels that patient is back should be imaged with an MRI due to muscle spasms-I explained the muscle spasms are common when someone is working harder than they have for a while.  Also it is not midline back pain; do not think an MRI is necessary at this time.  19. Disposition:   At baseline, patient received significant assistance from his mother, aged 49. He required her assistance to wipe stool after BM due to body habitus. He was able to ambulate to bathroom and back on his own. Plan is for discharge home to father, who is a IT sales professionalfirefighter, and who can provide more physical support to patient. Goal is  ModA level for ambulation within his room.   3/22- pt insists going home with mother- says she can do wound care better- will go to fathers when mother has surgery.    3/23- mother's surgery on 4/29  3/30- moved d/c date to 4/12 per therapy.   4/7- will see if we need ot keep longer to get home safely- every pt can benefit from staying longer- but what's needed to get pt home.   4/8- will see next Monday if needs to be extended- base don d/w therapy, I'm not sure he will, to get home safely.     LOS: 32 days A FACE TO FACE EVALUATION WAS PERFORMED  Erick Colacendrew E Rahel Carlton 09/07/2020, 12:43 PM

## 2020-09-07 NOTE — Progress Notes (Signed)
Attempts x 2 to change dressing to buttocks but patient refused states he only want to have a BM before he does anything else. Small amount of stool noted, incontinent care provided and repositioned

## 2020-09-07 NOTE — Progress Notes (Signed)
Patient resting in bed alert and extremely emotional with crying episodes observed,yelling out of rectal pain and discomfort and "that no one cares about him or that he is in discomfort,because he has been like this all day". Reassurance and emotional support provided, allowed patient to further ventilate his concerns and explain currents orders for SSE to be provided, medications provided. SSE administered with some  Resistance secondary to patient increase anxiety level, approximately 500 cc of enema administered, with only a small amount of soft stool noted, Continue to provide support and reassurrance

## 2020-09-07 NOTE — Progress Notes (Signed)
Physical Therapy Session Note  Patient Details  Name: Carl Hill MRN: [id number] Date of Birth: Oct 12, 1970  Today's Date: 09/07/2020 PT Individual Time: 5597-4163 PT Individual Time Calculation (min): 23 min   and  Today's Date: 09/07/2020 PT Missed Time: 37 Minutes Missed Time Reason: Nursing care  Short Term Goals: Week 5:  PT Short Term Goal 1 (Week 5): =LTG due to ELOS  Skilled Therapeutic Interventions/Progress Updates:    Pt received L sidelying in bed and agreeable to therapy session but requests RN to perform sacral dressing change prior to starting therapy session. RN notified and reported pt declined her performing this earlier in day prior to therapy session. Missed 37 minutes of skilled physical therapy for nursing care.   RN notified therapist once completed with dressing change. Pt received L sidelying in bed and excited, eager to participate in session - reports disappointment that his rectal spasm pain prevented him from participating in therapy yesterday. Supine>sitting L EOB, HOB partially elevated and using bedrail, mod-I. Sitting EOB mod assist to thread shorts and max assist to don shoes. Sit>stand EOB>bari RW CGA and therapist holding down R side of RW to prevent it from tipping during transition so pt could push up with L hand on RW - total assist to pull pants up over hips with pt stating he doesn't feel that his balance is good enough for him to let go of walker at this time to complete this task. Donned R knee sleeve but removed after 1st walk because it fell down and was not providing any support/proprioceptive input. Gait training 35ft, 89ft using RW (seated break between) with CGA of 1 and +2 w/c follow for safety - demos excessive R LE hip external rotation (ER) throughout gait and slight increase L LE hip ER with reciprocal stepping pattern. Gait training ~70ft back to room from w/c using RW as above and pt able to safely turn and sit on EOB without LOB.  Sit>supine mod-I using bed features. Pt left supine in bed with needs in reach.  Therapy Documentation Precautions:  Precautions Precautions: Fall Precaution Comments: foley catheter, large buttocks wound, R heel wound Restrictions Weight Bearing Restrictions: No  Pain:   No reports of pain throughout session.   Therapy/Group: Individual Therapy  Ginny Forth , PT, DPT, CSRS  09/07/2020, 1:15 PM

## 2020-09-07 NOTE — Progress Notes (Signed)
Patient with very large bowel movement this morning formed semi soft. Patient reported releif with this. Annusol cream applied. Continue to monitor

## 2020-09-08 LAB — GLUCOSE, CAPILLARY
Glucose-Capillary: 121 mg/dL — ABNORMAL HIGH (ref 70–99)
Glucose-Capillary: 102 mg/dL — ABNORMAL HIGH (ref 70–99)
Glucose-Capillary: 94 mg/dL (ref 70–99)
Glucose-Capillary: 131 mg/dL — ABNORMAL HIGH (ref 70–99)

## 2020-09-08 NOTE — Plan of Care (Signed)
  Problem: Consults Goal: Skin Care Protocol Initiated - if Braden Score 18 or less Description: WOC following patient Pt will understand plan of care for wounds Outcome: Progressing Goal: Diabetes Guidelines if Diabetic/Glucose > 140 Description: If diabetic or lab glucose is > 140 mg/dl - Initiate Diabetes/Hyperglycemia Guidelines & Document Interventions  Pt will understand diet, medications, and need for f/u care with min cues Outcome: Progressing   Problem: RH BOWEL ELIMINATION Goal: RH STG MANAGE BOWEL WITH ASSISTANCE Description: STG Manage Bowel with min Assistance. Outcome: Progressing   Problem: RH BLADDER ELIMINATION Goal: RH STG MANAGE BLADDER WITH EQUIPMENT WITH ASSISTANCE Description: STG Manage Bladder With  foley catheter Equipment With min Assistance Outcome: Progressing   Problem: RH SKIN INTEGRITY Goal: RH STG SKIN FREE OF INFECTION/BREAKDOWN Description: Pt will have improvement of current wounds no further breakdown or infection while on rehab with min assist Outcome: Progressing Goal: RH STG MAINTAIN SKIN INTEGRITY WITH ASSISTANCE Description: STG Maintain Skin Integrity With min Assistance. Outcome: Progressing Goal: RH STG ABLE TO PERFORM INCISION/WOUND CARE W/ASSISTANCE Description: STG Able To Perform Incision/Wound Care With max Assistance. Outcome: Progressing   Problem: RH SAFETY Goal: RH STG ADHERE TO SAFETY PRECAUTIONS W/ASSISTANCE/DEVICE Description: STG Adhere to Safety Precautions With min Assistance/Device. Outcome: Progressing Goal: RH STG DECREASED RISK OF FALL WITH ASSISTANCE Description: STG Decreased Risk of Fall With min Assistance. Outcome: Progressing   Problem: RH PAIN MANAGEMENT Goal: RH STG PAIN MANAGED AT OR BELOW PT'S PAIN GOAL Description: Pt will have pain that is <= 4/10 Outcome: Progressing   Problem: RH KNOWLEDGE DEFICIT GENERAL Goal: RH STG INCREASE KNOWLEDGE OF SELF CARE AFTER HOSPITALIZATION Description: Patient  will demonstrate knowledge of medication management, dietary management, diabetes management, skin/wound care management with educational materials and handouts provided by staff. Outcome: Progressing   Problem: Consults Goal: RH GENERAL PATIENT EDUCATION Description: See Patient Education module for education specifics. Outcome: Progressing   

## 2020-09-08 NOTE — Progress Notes (Signed)
PROGRESS NOTE   Subjective/Complaints:  Pt concerned about getting very constipated again, refused dilaudid fo rdressing change Still taking MS COntin 15mg  BID Takes oxyIR 10mg  ~BID  ROS:   Pt denies SOB, CP, N/V/, and vision changes   Objective:   No results found. No results for input(s): WBC, HGB, HCT, PLT in the last 72 hours. No results for input(s): NA, K, CL, CO2, GLUCOSE, BUN, CREATININE, CALCIUM in the last 72 hours.  Intake/Output Summary (Last 24 hours) at 09/08/2020 1019 Last data filed at 09/08/2020 0700 Gross per 24 hour  Intake 1200 ml  Output 4525 ml  Net -3325 ml        Physical Exam: Vital Signs Blood pressure 139/71, pulse 84, temperature 98.1 F (36.7 C), resp. rate 18, height 5\' 10"  (1.778 m), weight (!) 227.1 kg, SpO2 95 %.  General: No acute distress Mood and affect are appropriate Heart: Regular rate and rhythm no rubs murmurs or extra sounds Lungs: Clear to auscultation, breathing unlabored, no rales or wheezes Abdomen: Positive bowel sounds, soft nontender to palpation, nondistended Extremities: No clubbing, cyanosis, or edema   Neurological: alert  Musc: No edema in extremities.  No tenderness in extremities. Motor: Bilateral upper extremities: 5/5 throughout, unchanged Bilateral lower extremities: Hip flexion grossly 4+/5, distally 5/5, unchanged  Assessment/Plan: 1. Functional deficits which require 3+ hours per day of interdisciplinary therapy in a comprehensive inpatient rehab setting.  Physiatrist is providing close team supervision and 24 hour management of active medical problems listed below.  Physiatrist and rehab team continue to assess barriers to discharge/monitor patient progress toward functional and medical goals  Care Tool:  Bathing    Body parts bathed by patient: Right arm,Left arm,Chest,Abdomen,Front perineal area,Right upper leg,Left upper leg,Right lower  leg,Left lower leg,Face   Body parts bathed by helper: Buttocks     Bathing assist Assist Level: Minimal Assistance - Patient > 75%     Upper Body Dressing/Undressing Upper body dressing   What is the patient wearing?: Pull over shirt    Upper body assist Assist Level: Supervision/Verbal cueing    Lower Body Dressing/Undressing Lower body dressing      What is the patient wearing?: Incontinence brief     Lower body assist Assist for lower body dressing: Maximal Assistance - Patient 25 - 49%     Toileting Toileting    Toileting assist Assist for toileting: Dependent - Patient 0%     Transfers Chair/bed transfer  Transfers assist  Chair/bed transfer activity did not occur: Safety/medical concerns  Chair/bed transfer assist level: Contact Guard/Touching assist Chair/bed transfer assistive device: Armrests,Walker   Locomotion Ambulation   Ambulation assist   Ambulation activity did not occur: Safety/medical concerns  Assist level: Contact Guard/Touching assist Assistive device: Walker-rolling Max distance: 4195ft   Walk 10 feet activity   Assist  Walk 10 feet activity did not occur: Safety/medical concerns  Assist level: Contact Guard/Touching assist Assistive device: Walker-rolling   Walk 50 feet activity   Assist Walk 50 feet with 2 turns activity did not occur:  (straight path only)  Assist level: Contact Guard/Touching assist Assistive device: Walker-rolling    Walk 150 feet activity  Assist Walk 150 feet activity did not occur: Safety/medical concerns         Walk 10 feet on uneven surface  activity   Assist Walk 10 feet on uneven surfaces activity did not occur: Safety/medical concerns         Wheelchair     Assist Will patient use wheelchair at discharge?: Yes Type of Wheelchair: Manual Wheelchair activity did not occur: Safety/medical concerns  Wheelchair assist level: Supervision/Verbal cueing Max wheelchair  distance: 100'    Wheelchair 50 feet with 2 turns activity    Assist    Wheelchair 50 feet with 2 turns activity did not occur: Safety/medical concerns   Assist Level: Supervision/Verbal cueing   Wheelchair 150 feet activity     Assist  Wheelchair 150 feet activity did not occur: Safety/medical concerns       Blood pressure 139/71, pulse 84, temperature 98.1 F (36.7 C), resp. rate 18, height 5\' 10"  (1.778 m), weight (!) 227.1 kg, SpO2 95 %.  Medical Problem List and Plan: 1.  Debility secondary to septic shock from necrotizing soft tissue infection of perianal, gluteal, and perineal tissues.  3/28- con't PT and PT- progressing- walked 100 ft this weekend, total- see if pt can get into shower?- Will change dressing afterwards 3/29- walked 45 ft at once yesterday- doing shower today- did dry run as well- con't PT and OT  -4/2- con't PT and OT- will see if can sit him up at [facility] to eat- but cannot address utnil I speak with 4/29  4/6-continue PT and OT-we will discuss with team if we can extend him a little more-as of tomorrow 4 7 he will be here 30-days.  4/8- con't PT and OT- explained to pt he's already been here 30 days- that we need to complete what's needed to get him home, but to keep him, just to keep him, isn't appropriate, we need to make sure will make further functional gains- he wasn't happy with this, but I explained patients don't stay in CIR >30 days, because we are an ACUTE Rehab facility, not subacute.   Tentative discharge 4/12 will not be having home health services 2.  Antithrombotics: -DVT/anticoagulation:  Pharmaceutical:   Xarelto             -antiplatelet therapy: N/A 3. Pain Management:  Continue Lyrica and elavil for neuropathy.              Oxycontin 15 mg bid with 10 mg prn for breakthrough pain.   On robaxin 750 mg q6 hours prn  3/29- pain controlled on current regimen- con't regimen- will need to discuss with pt, will likely need to  reduce pain meds prior to d/c to get it covered by insurance.   3/30- d/w pt- will not be able to send home on three opiates- asked him to think about it.   4/1- will d/w pt again next week- con't pain regimen for now 4/2-discussed at length oxycodone versus Dilaudid for home regimen; will change  his OxyContin to MS Contin 15 mg twice a day; will wait on changing the Dilaudid versus oxycodone. Also added kpad for pain.   4/3-patient did not report any change in his overall pain with transition to MS Contin; continue regimen  4/5- having anal pain- from constant BMs- con't regimen  4/6-discussed using B and O suppositories for anal cramping when cleaning them out again over this weekend-also discussed using as little pain medicines as possible to try to reduce  constipation.  4/8- added B&O suppositories- can increase frequency if needed, but keep prn; help rectal pain and don't cause constipation they same; also added Valium 5 mg q8 hours prn- also explained is NOT going home on Valium.  Will discontinue Pt states he will be discussing pain meds with Dr Berline Chough tomorrow  4. Mood: LCSW to follow for evaluation and support.              -antipsychotic agents: N/A 5. Neuropsych: This patient is capable of making decisions on his own behalf. 6. Skin/Wound Care: Air mattress for pressure relief measures.              --will need local care frequently to keep sacral wound bed clean.             --Continue foley for now.  Added hemorrhoidal cream to assist with any pain which might be ano-rectal/hemorrhoidal  4/2-postop wound is healing; continue regimen  4/3-still requires the hemorrhoidal cream for anal pain  4/7- changed Anusol and tucks to after every BM 7. Fluids/Electrolytes/Nutrition: Monitor I/Os.  8. Necrotizing fascitis: Has been on Augmentin since 02/11--> to continue for 6 months per ID 9. T2DM with neuropathy: Hgb A1c- 9.6 (down from 13 in Aug) Will continue to monitor BS ac/hs and use SSI  for elevated BS             --used Farxiga, metformin and Victoza 1.8.             --Continue Levemir bid and titrate as indicated  -decreased novolog to 10 units TID with meals    CBG (last 3)  Recent Labs    09/07/20 1945 09/07/20 2114 09/08/20 0612  GLUCAP 150* 122* 121*  Controlled 4/10  10 Hyponatremia:              Sodium 138 on 3/21, labs ordered for Monday 11. Hypokalemia:    Potassium 3.8 on 3/21  4/3-we will recheck his labs in the morning  4/5- K+ 3.8- con't regimen 12. Proximal A FIb: Monitor HR tid--continue Cardizem 240 mg and amiodarone for rate control.              --monitor for symptoms with increase in activity.  4/1- controlled- con't regimen 13. OIC:              Discontinued colace  4/2- will Give Sorbitol 60 ml and see if will poop- if not, will need to do Mg citrate again.    4/3-had multiple bowel movements yesterday-will add MiraLAX back scheduled  Improved   4/5- had enema yesterday and having constant BMs since then- will check another KUB to see how stool burden is doing  4/6-patient is still full of stool according to the latest KUB-discussed with patient waiting till this weekend when he did not have therapy to clean him out further-increased his MiraLAX as well as added Senokot 1 tab twice daily-do not want to do today or tomorrow because we do not want to miss more therapy if we can help  4/7- large BM last night- will get cleaned out more starting Friday/Saturday.   4/8- has large stool ball- refused soap suds enema- needs to get cleaned out- ordered Mg citrate today and followed by soap suds- they can change type of enema, but will need it. Large bowel movement on 4/9 in am , and 2 smaller BMs in the pm Cont Miralax daily, Senna S and colace 1 daily  14. Chronic right heel ulcer:  Continue to paint wound daily with iodine.   3/16- is Stage II- healing  3/29- added vaseline daily- less, but still has peeling skin- asked him to scrub in shower  today  3/30- pt scrubbed in shower- will con't to monitor- actual wound appears closed now.   3/31- resolved- will d/c wound care orders and place vaseline order nightly to B/L feet and cover with socks  4/1- asked nursing to put on Vaseline on feet before bed- placed order 15.  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 Will need bariatric equipment.  3/28- BMI down to 72  4/1- weight down to 227.6 kg- so down slightly- BMI down to 72.0 form 72.4- con't regimen\  4/2-patient's goal is to be 499 pounds by discharge-he really wants to go home on his current diabetic regimen and he actually wants to go home eating the meals he has eaten in the hospital.  Encouraged this.  4/8- BMI down slightly to 71.83 16. Trach site  3/15- resolved/healed 17. Foley for urinary retention  3/23- pt wants to go home with foley- will need f/u with Urology to get out- since bladder, being a muscle, will be weak. Will need bariatric BSC if at possible.  3/31- pt foley is required due to wounds and urinary retention- con't foley and monitor- con't [facility] baths   4/2-explained if patient cannot get home health, he will need to go to the urologist to get the Foley changed once a month.  4/6-we will not be able to get home health therapy or nursing- so will need to set up with urology to make sure he can get his Foley changed monthly 18. Muscle spasms  See #3  3/29- doing better on Robaxin- con't regimen  4/2-mother feels that patient is back should be imaged with an MRI due to muscle spasms-I explained the muscle spasms are common when someone is working harder than they have for a while.  Also it is not midline back pain; do not think an MRI is necessary at this time.  19. Disposition:   At baseline, patient received significant assistance from his mother, aged 69. He required her assistance to wipe stool after BM due to body habitus. He was able to ambulate to bathroom and back on his own. Plan is for discharge  home to father, who is a IT sales professional, and who can provide more physical support to patient. Goal is ModA level for ambulation within his room.   3/22- pt insists going home with mother- says she can do wound care better- will go to fathers when mother has surgery.    3/23- mother's surgery on 4/29  3/30- moved d/c date to 4/12 per therapy.   4/7- will see if we need ot keep longer to get home safely- every pt can benefit from staying longer- but what's needed to get pt home.   4/8- will see next Monday if needs to be extended- base don d/w therapy, I'm not sure he will, to get home safely.     LOS: 33 days A FACE TO FACE EVALUATION WAS PERFORMED  Erick Colace 09/08/2020, 10:19 AM

## 2020-09-08 NOTE — Progress Notes (Signed)
Physical Therapy Session Note  Patient Details  Name: Carl Hill MRN: [id number] Date of Birth: [birth year]  Today's Date: 09/08/2020 PT Individual Time: 1300-1400 PT Individual Time Calculation (min): 60 min   Short Term Goals: Week 5:  PT Short Term Goal 1 (Week 5): =LTG due to ELOS  Skilled Therapeutic Interventions/Progress Updates:    Pt received seated in w/c in room, agreeable to PT session. No complaints of pain at rest, does have onset of back spasms towards end of session. Pain not rated, utilized repositioning for pain management. Dependent transport via w/c outdoors for car transfer on vehicle pt will d/c home in. Pt is able to stand from w/c to RW with CGA with R side of RW braced. Car transfer x 2 reps with CGA and RW into pt's truck. Pt demonstrates good safety awareness and ability to perform transfer safely. Pt's mother present and demonstrates good understanding of pt's current assist level with car transfers. Sit to stand onto standing scale with CGA. Pt currently weighs 485.4 lbs, pt excited by weight loss. Ascend/descend bottom 6" step with 2 handrails, one time ascending with RLE, one time ascending with LLE with CGA and no issue. Pt attempts to ascend step again with LLE first, RLE buckles once placed on step. Pt able to be safely lowered back into w/c. Pt with skin abrasions to R shin and inner L elbow following RLE buckling on stairs, nursing notified. Pt requires assist x 2 to safely scoot back in w/c all the way. Pt continues to exhibit ongoing LE weakness, R>L and stairs remain a barrier to a safe d/c home. Continue to recommend a ramp be in place upon d/c home, cost is an issue for patient and family. Pt returned to room. Stand pivot transfer w/c to bed with RW and CGA. Sit to supine mod I with use of bedrail. Continue to recommend pt purchase a bedrail for use at home for increased safety and independence with bed mobility. Pt left semi-reclined in bed with needs in  reach at end of session.  Therapy Documentation Precautions:  Precautions Precautions: Fall Precaution Comments: foley catheter, large buttocks wound, R heel wound Restrictions Weight Bearing Restrictions: No    Therapy/Group: Individual Therapy   Peter Congo, PT, DPT, CSRS  09/08/2020, 3:53 PM

## 2020-09-08 NOTE — Progress Notes (Signed)
Patient is refusing Dilaudid prior to dressing change states he want o not take as much pain medication so he to deal with being constipation like he did.. Patient is aware that medication is available as needed and ordered, states he understands.  09/08/2020 0620  Patient insist that he doesn't want his sacrum dressing changed until prior to his Therapy, incontinent care provided moderate BM. Continue regime

## 2020-09-08 NOTE — Progress Notes (Signed)
Patient is in good spirits,excited with current weight loss, and the way his treatment is going. No significant changes in care treatment. Reassurance continue and support,patient is anticipating discharging home soon    09/09/2020 0500 Wound dressing completed per orders no drainage, request that Dilaudid not be administered prior to dressing change this time,because he want to slowly take himself off the narcotic

## 2020-09-09 LAB — CBC WITH DIFFERENTIAL/PLATELET
Abs Immature Granulocytes: 0.02 10*3/uL (ref 0.00–0.07)
Basophils Absolute: 0.1 10*3/uL (ref 0.0–0.1)
Basophils Relative: 1 %
Eosinophils Absolute: 0.2 10*3/uL (ref 0.0–0.5)
Eosinophils Relative: 3 %
HCT: 38 % — ABNORMAL LOW (ref 39.0–52.0)
Hemoglobin: 11.6 g/dL — ABNORMAL LOW (ref 13.0–17.0)
Immature Granulocytes: 0 %
Lymphocytes Relative: 42 %
Lymphs Abs: 3.2 10*3/uL (ref 0.7–4.0)
MCH: 25.8 pg — ABNORMAL LOW (ref 26.0–34.0)
MCHC: 30.5 g/dL (ref 30.0–36.0)
MCV: 84.4 fL (ref 80.0–100.0)
Monocytes Absolute: 0.6 10*3/uL (ref 0.1–1.0)
Monocytes Relative: 8 %
Neutro Abs: 3.5 10*3/uL (ref 1.7–7.7)
Neutrophils Relative %: 46 %
Platelets: 242 10*3/uL (ref 150–400)
RBC: 4.5 MIL/uL (ref 4.22–5.81)
RDW: 16.7 % — ABNORMAL HIGH (ref 11.5–15.5)
WBC: 7.6 10*3/uL (ref 4.0–10.5)
nRBC: 0 % (ref 0.0–0.2)

## 2020-09-09 LAB — BASIC METABOLIC PANEL
Anion gap: 7 (ref 5–15)
BUN: 12 mg/dL (ref 6–20)
CO2: 29 mmol/L (ref 22–32)
Calcium: 9.2 mg/dL (ref 8.9–10.3)
Chloride: 103 mmol/L (ref 98–111)
Creatinine, Ser: 0.96 mg/dL (ref 0.61–1.24)
GFR, Estimated: >60 mL/min (ref >60)
Glucose, Bld: 152 mg/dL — ABNORMAL HIGH (ref 70–99)
Potassium: 4.2 mmol/L (ref 3.5–5.1)
Sodium: 139 mmol/L (ref 135–145)

## 2020-09-09 LAB — GLUCOSE, CAPILLARY
Glucose-Capillary: 114 mg/dL — ABNORMAL HIGH (ref 70–99)
Glucose-Capillary: 148 mg/dL — ABNORMAL HIGH (ref 70–99)
Glucose-Capillary: 71 mg/dL (ref 70–99)
Glucose-Capillary: 118 mg/dL — ABNORMAL HIGH (ref 70–99)

## 2020-09-09 NOTE — Progress Notes (Signed)
Patient ID: Carl Hill, male   DOB: 04-16-1971, 50 y.o.   MRN: [id number]  After discussion with medical team, pt will now discharge on Thursday 4/15 to allow for further more therapy to help work on strength and endurance.  *PT requested w/c for pt to use in community. Size requested for 30x20. SW explained this will be a custom wheelchair and will not be available by discharge.   SW updated pt while in PT therapy to discuss above. Pt amenable to ambulance transport home due to safety reasons. Pt aware w/c will not be available by date of discharge.   SW scheduled ambulance transport with [facility] for 9:30am pick up. SW updated [facility] on delay in pt discharge date.   *When exploring custom w/c options, reports pt will need to be seen a wheelchair seating clinic for further evaluation, and documentation must support that a w/c can be used in the home not just in the community; a home evalutaion is conducted to support this w/c is needed by the seating clinic. SW updated PT on above.  Cecile Sheerer, MSW, LCSWA Office: [phone] Cell: [phone] Fax: [phone]

## 2020-09-09 NOTE — Progress Notes (Signed)
PROGRESS NOTE   Subjective/Complaints:  Has decided only wants Oxycodone, not dilaudid anymore after d/c -scared of constipation.   Had a 10 lb baby- of stool this Saturday.    ROS:   Pt denies SOB, abd pain, CP, N/V/C/D, and vision changes  Objective:   No results found. Recent Labs    09/09/20 0719  WBC 7.6  HGB 11.6*  HCT 38.0*  PLT 242   Recent Labs    09/09/20 0719  NA 139  K 4.2  CL 103  CO2 29  GLUCOSE 152*  BUN 12  CREATININE 0.96  CALCIUM 9.2    Intake/Output Summary (Last 24 hours) at 09/09/2020 2011 Last data filed at 09/09/2020 1954 Gross per 24 hour  Intake 1020 ml  Output 5375 ml  Net -4355 ml        Physical Exam: Vital Signs Blood pressure (!) 149/66, pulse 65, temperature 98.1 F (36.7 C), temperature source Oral, resp. rate 18, height 5\' 10"  (1.778 m), weight (!) 227.1 kg, SpO2 98 %.    General: awake, alert, appropriate, in bariatric low air loss mattress; NAD HENT: conjugate gaze; oropharynx moist CV: regular rate; no JVD Pulmonary: CTA B/L; no W/R/R- good air movement GI: soft, NT, ND, (+)BS- normoactive Psychiatric: appropriate Neurological: Ox3 Musc: No edema in extremities.  No tenderness in extremities. Motor: Bilateral upper extremities: 5/5 throughout, unchanged Bilateral lower extremities: Hip flexion grossly 4+/5, distally 5/5, unchanged  Assessment/Plan: 1. Functional deficits which require 3+ hours per day of interdisciplinary therapy in a comprehensive inpatient rehab setting.  Physiatrist is providing close team supervision and 24 hour management of active medical problems listed below.  Physiatrist and rehab team continue to assess barriers to discharge/monitor patient progress toward functional and medical goals  Care Tool:  Bathing    Body parts bathed by patient: Right arm,Left arm,Chest,Abdomen,Front perineal area,Right upper leg,Left upper  leg,Right lower leg,Left lower leg,Face   Body parts bathed by helper: Buttocks     Bathing assist Assist Level: Minimal Assistance - Patient > 75%     Upper Body Dressing/Undressing Upper body dressing   What is the patient wearing?: Pull over shirt    Upper body assist Assist Level: Supervision/Verbal cueing    Lower Body Dressing/Undressing Lower body dressing      What is the patient wearing?: Incontinence brief     Lower body assist Assist for lower body dressing: Maximal Assistance - Patient 25 - 49%     Toileting Toileting    Toileting assist Assist for toileting: Dependent - Patient 0%     Transfers Chair/bed transfer  Transfers assist  Chair/bed transfer activity did not occur: Safety/medical concerns  Chair/bed transfer assist level: Contact Guard/Touching assist Chair/bed transfer assistive device:   Ambulation assist   Ambulation activity did not occur: Safety/medical concerns  Assist level: Contact Guard/Touching assist Assistive device: Walker-rolling Max distance: 61'   Walk 10 feet activity   Assist  Walk 10 feet activity did not occur: Safety/medical concerns  Assist level: Contact Guard/Touching assist Assistive device: Walker-rolling   Walk 50 feet activity   Assist Walk 50 feet with 2 turns activity did  not occur:  (straight path only)  Assist level: Contact Guard/Touching assist Assistive device: Walker-rolling    Walk 150 feet activity   Assist Walk 150 feet activity did not occur: Safety/medical concerns         Walk 10 feet on uneven surface  activity   Assist Walk 10 feet on uneven surfaces activity did not occur: Safety/medical concerns         Wheelchair     Assist Will patient use wheelchair at discharge?: Yes Type of Wheelchair: Manual Wheelchair activity did not occur: Safety/medical concerns  Wheelchair assist level: Supervision/Verbal cueing Max wheelchair  distance: 100'    Wheelchair 50 feet with 2 turns activity    Assist    Wheelchair 50 feet with 2 turns activity did not occur: Safety/medical concerns   Assist Level: Supervision/Verbal cueing   Wheelchair 150 feet activity     Assist  Wheelchair 150 feet activity did not occur: Safety/medical concerns       Blood pressure (!) 149/66, pulse 65, temperature 98.1 F (36.7 C), temperature source Oral, resp. rate 18, height  (1.778 m), weight (!) 227.1 kg, SpO2 98 %.  Medical Problem List and Plan: 1.  Debility secondary to septic shock from necrotizing soft tissue infection of perianal, gluteal, and perineal tissues.  3/28- con't PT and PT- progressing- walked 100 ft this weekend, total- see if pt can get into shower?- Will change dressing afterwards 3/29- walked 45 ft at once yesterday- doing shower today- did dry run as well- con't PT and OT  -4/2- con't PT and OT- will see if can sit him up at EOB to eat- but cannot address utnil I speak with Cheri Guppy  4/6-continue PT and OT-we will discuss with team if we can extend him a little more-as of tomorrow 4 7 he will be here 30-days.  4/8- con't PT and OT- explained to pt he's already been here 30 days- that we need to complete what's needed to get him home, but to keep him, just to keep him, isn't appropriate, we need to make sure will make further functional gains- he wasn't happy with this, but I explained patients don't stay in CIR >30 days, because we are an ACUTE Rehab facility, not subacute.   Tentative discharge 4/12 will not be having home health services  4/11- got H/H finally- will d/c Thursday 4/14- and go home by Ambulance.  2.  Antithrombotics: -DVT/anticoagulation:  Pharmaceutical:   Xarelto             -antiplatelet therapy: N/A 3. Pain Management:  Continue Lyrica and elavil for neuropathy.              Oxycontin 15 mg bid with 10 mg prn for breakthrough pain.   On robaxin 750 mg q6 hours  prn  3/29- pain controlled on current regimen- con't regimen- will need to discuss with pt, will likely need to reduce pain meds prior to d/c to get it covered by insurance.   3/30- d/w pt- will not be able to send home on three opiates- asked him to think about it.   4/1- will d/w pt again next week- con't pain regimen for now 4/2-discussed at length oxycodone versus Dilaudid for home regimen; will change  his OxyContin to MS Contin 15 mg twice a day; will wait on changing the Dilaudid versus oxycodone. Also added kpad for pain.   4/3-patient did not report any change in his overall pain with transition to MS  Contin; continue regimen  4/5- having anal pain- from constant BMs- con't regimen  4/6-discussed using B and O suppositories for anal cramping when cleaning them out again over this weekend-also discussed using as little pain medicines as possible to try to reduce constipation.  4/8- added B&O suppositories- can increase frequency if needed, but keep prn; help rectal pain and don't cause constipation they same; also added Valium 5 mg q8 hours prn- also explained is NOT going home on Valium.  Will discontinue Pt states he will be discussing pain meds with Dr Berline Chough  tomorrow   4/11- will d/c Dilaudid and con't Oxy and Oxycontin 4. Mood: LCSW to follow for evaluation and support.              -antipsychotic agents: N/A 5. Neuropsych: This patient is capable of making decisions on his own behalf. 6. Skin/Wound Care: Air mattress for pressure relief measures.              --will need local care frequently to keep sacral wound bed clean.             --Continue foley for now.  Added hemorrhoidal cream to assist with any pain which might be ano-rectal/hemorrhoidal  4/2-postop wound is healing; continue regimen  4/3-still requires the hemorrhoidal cream for anal pain  4/7- changed Anusol and tucks to after every BM 7. Fluids/Electrolytes/Nutrition: Monitor I/Os.  8. Necrotizing fascitis: Has  been on Augmentin since 02/11--> to continue for 6 months per ID 9. T2DM with neuropathy: Hgb A1c- 9.6 (down from 13 in Aug) Will continue to monitor BS ac/hs and use SSI for elevated BS             --used Farxiga, metformin and Victoza 1.8.             --Continue Levemir bid and titrate as indicated  -decreased novolog to 10 units TID with meals    CBG (last 3)  Recent Labs    09/09/20 0558 09/09/20 1127 09/09/20 1657  GLUCAP 114* 148* 71  Controlled 4/10  4/11- controlled overall- con't regimen 10 Hyponatremia:              Sodium 138 on 3/21, labs ordered for Monday 11. Hypokalemia:    Potassium 3.8 on 3/21  4/3-we will recheck his labs in the morning  4/5- K+ 3.8- con't regimen 12. Proximal A FIb: Monitor HR tid--continue Cardizem 240 mg and amiodarone for rate control.              --monitor for symptoms with increase in activity.  4/1- controlled- con't regimen 13. OIC:              Discontinued colace  4/2- will Give Sorbitol 60 ml and see if will poop- if not, will need to do Mg citrate again.    4/3-had multiple bowel movements yesterday-will add MiraLAX back scheduled  Improved   4/5- had enema yesterday and having constant BMs since then- will check another KUB to see how stool burden is doing  4/6-patient is still full of stool according to the latest KUB-discussed with patient waiting till this weekend when he did not have therapy to clean him out further-increased his MiraLAX as well as added Senokot 1 tab twice daily-do not want to do today or tomorrow because we do not want to miss more therapy if we can help  4/7- large BM last night- will get cleaned out more starting Friday/Saturday.   4/8- has large stool  ball- refused soap suds enema- needs to get cleaned out- ordered Mg citrate today and followed by soap suds- they can change type of enema, but will need it. Large bowel movement on 4/9 in am , and 2 smaller BMs in the pm Cont Miralax daily, Senna S and colace 1  daily   4/11- will stop Dilaudid and con't bowel regimen 14. Chronic right heel ulcer: Continue to paint wound daily with iodine.   3/16- is Stage II- healing  3/29- added vaseline daily- less, but still has peeling skin- asked him to scrub in shower today  3/30- pt scrubbed in shower- will con't to monitor- actual wound appears closed now.   3/31- resolved- will d/c wound care orders and place vaseline order nightly to B/L feet and cover with socks  4/1- asked nursing to put on Vaseline on feet before bed- placed order 15.  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 Will need bariatric equipment.  3/28- BMI down to 72  4/1- weight down to 227.6 kg- so down slightly- BMI down to 72.0 form 72.4- con't regimen\  4/2-patient's goal is to be 499 pounds by discharge-he really wants to go home on his current diabetic regimen and he actually wants to go home eating the meals he has eaten in the hospital.  Encouraged this.  4/8- BMI down slightly to 71.83  4/11- pt said got weighed this weekend- was 485.5 lbs-  16. Trach site  3/15- resolved/healed 17. Foley for urinary retention  3/23- pt wants to go home with foley- will need f/u with Urology to get out- since bladder, being a muscle, will be weak. Will need bariatric BSC if at possible.  3/31- pt foley is required due to wounds and urinary retention- con't foley and monitor- con't [facility] baths   4/2-explained if patient cannot get home health, he will need to go to the urologist to get the Foley changed once a month.  4/6-we will not be able to get home health therapy or nursing- so will need to set up with urology to make sure he can get his Foley changed monthly 18. Muscle spasms  See #3  3/29- doing better on Robaxin- con't regimen  4/2-mother feels that patient is back should be imaged with an MRI due to muscle spasms-I explained the muscle spasms are common when someone is working harder than they have for a while.  Also it is not  midline back pain; do not think an MRI is necessary at this time.  19. Disposition:   At baseline, patient received significant assistance from his mother, aged 49. He required her assistance to wipe stool after BM due to body habitus. He was able to ambulate to bathroom and back on his own. Plan is for discharge home to father, who is a IT sales professionalfirefighter, and who can provide more physical support to patient. Goal is ModA level for ambulation within his room.   3/22- pt insists going home with mother- says she can do wound care better- will go to fathers when mother has surgery.    3/23- mother's surgery on 4/29  3/30- moved d/c date to 4/12 per therapy.   4/7- will see if we need ot keep longer to get home safely- every pt can benefit from staying longer- but what's needed to get pt home.   4/8- will see next Monday if needs to be extended- base don d/w therapy, I'm not sure he will, to get home safely.    4/11-  will go Home Thursday   LOS: 34 days A FACE TO FACE EVALUATION WAS PERFORMED  Cherith Tewell 09/09/2020, 8:11 PM

## 2020-09-09 NOTE — Progress Notes (Signed)
Occupational Therapy Session Note  Patient Details  Name: JORAH HUA MRN: [id number] Date of Birth: 28-May-1971  Today's Date: 09/09/2020 OT Individual Time: 1100-1155 OT Individual Time Calculation (min): 55 min    Short Term Goals: Week 5:  OT Short Term Goal 1 (Week 5): STG=LTG 2/2 ELOS  Skilled Therapeutic Interventions/Progress Updates:    Pt resting in bed upon arrival. Initial plan to walk into bathroom with RW to take shower. Pt's Lt knee buckled earlier in PT session. Sit<>stand from EOB with CGA and attempted stand pivot transfer. 1st attempt not successful and pt sat back EOB. 2nd attempt successful and pt able to complete stand pivot transfer using RW with CGA. Pt completed grooming tasks at sink and plan made to complete bathing in afternoon session. Pt propelled w/c beside bed and completed sit<>stand from w/c with CGA. Stand pivot transfer with CGA. Sit>supine with supervision. Pt remained in bed with all needs within reach. Pt dismayed that he was unable to take shower this session but pleased with plan to take shower following day.   Therapy Documentation Precautions:  Precautions Precautions: Fall Precaution Comments: foley catheter, large buttocks wound, R heel wound Restrictions Weight Bearing Restrictions: No Pain: Pain Assessment Pain Scale: 0-10 Pain Score: 7  Pain Type: Acute pain Pain Location: Buttocks Pain Descriptors / Indicators: Aching Pain Frequency: Constant Pain Onset: On-going Patients Stated Pain Goal: 0 Pt also c/o back spasms with transitional movements Repositioned   Therapy/Group: Individual Therapy  [name] 09/09/2020, 12:34 PM

## 2020-09-09 NOTE — Progress Notes (Signed)
Physical Therapy Session Note  Patient Details  Name: Carl Hill MRN: [id number] Date of Birth: Feb 17, 1971  Today's Date: 09/09/2020 PT Individual Time: 0900-1000; 1500-1530 PT Individual Time Calculation (min): 60 min and 30 min  Short Term Goals: Week 5:  PT Short Term Goal 1 (Week 5): =LTG due to ELOS  Skilled Therapeutic Interventions/Progress Updates:    Session 1: Pt received seated in w/c in therapy gym with rehab tech present, agreeable to PT session. No complaints of pain at rest. ACE-wrapped R knee for increased stability with standing activities. Sit to stand with CGA to bottom of 6" steps with use of stair rails to stand. Ascend/descend bottom 6" step x 3 reps (with LLE first) with B handrails and assist x 2 for safety and balance. After seated rest break pt able to again ascend/descend bottom 6" step x 2 reps, LLE buckles on 3rd rep. Pt requires assist x 2 to sit back safely in w/c. Pt with R shin, B wrist, and L elbow abrasions following near fall, nursing notified. Sit to stand with min A to RW from w/c seat. Ambulation x 45', x 61', x 58' with RW and CGA with w/c follow for safety. No further instances of knees buckling this sessions. Pt returned to bed at end of session, Supervision for bed mobility with use of bedrail. Pt also reports onset of L knee pain at end of session, provided ice pack. Pt left supine in bed with needs in reach.  Session 2: Pt received supine in bed, agreeable to PT session. Pt reports intermittent pain/spasmsing in low back, utilized repositioning for pain management. Assisted pt with donning shorts, B knee sleeves, and B knee ACE wrap at bed level. Supine to sit with min A needed for trunk control, use of bedrail. Sit to stand x 3 reps to RW with CGA with RW braced on R side. Standing mini-squats 2 x 5 reps with RW and CGA for balance, cues for correct exercise performance. Pt returned to supine at Supervision level with use of bedrail. Applied ice  pack to L knee at end of session for pain management. Pt left supine in bed with needs in reach at end of session.  Therapy Documentation Precautions:  Precautions Precautions: Fall Precaution Comments: foley catheter, large buttocks wound, R heel wound Restrictions Weight Bearing Restrictions: No   Therapy/Group: Individual Therapy   Peter Congo, PT, DPT, CSRS  09/09/2020, 12:31 PM

## 2020-09-09 NOTE — Progress Notes (Signed)
Occupational Therapy Session Note  Patient Details  Name: LEMON STERNBERG MRN: [id number] Date of Birth: 1970-10-15  Today's Date: 09/09/2020 OT Individual Time: 1400-1440 OT Individual Time Calculation (min): 40 min    Short Term Goals: Week 5:  OT Short Term Goal 1 (Week 5): STG=LTG 2/2 ELOS  Skilled Therapeutic Interventions/Progress Updates:    OT intervention with focus on bed mobility, sit<>stand, standing balance, functional transfers, UB bathing/dressing w/c level. Bed mobility with supervision. Stand pivot tranfsers with RW with CGA. Pt propelled w/c to sink to complete UB bathing/dressing tasks. Sit<>stand from w/c and standing pivot transfers with CGA. Sit>supine in bed with supervision. Pt pleased with progress and being able to wash at sink now. Pt remained in bed with all needs within reach.   Therapy Documentation Precautions:  Precautions Precautions: Fall Precaution Comments: foley catheter, large buttocks wound, R heel wound Restrictions Weight Bearing Restrictions: No Pain: Pt reports sporadic lower back spasms with transitional movement; repositioned  Therapy/Group: Individual Therapy  [name] 09/09/2020, 2:52 PM

## 2020-09-09 NOTE — Progress Notes (Signed)
Inpatient Rehabilitation Care Coordinator Discharge Note  The overall goal for the admission was met for:   Discharge location: Yes. D/c to home with 24/7 care from mother.  Length of Stay: Yes. 37 days.   Discharge activity level: Yes. Supervision.  Home/community participation: Yes. Limited.  Services provided included: MD, RD, PT, OT, RN, CM, TR, Pharmacy, Neuropsych and SW  Financial Services: Medicaid  Choices offered to/list presented to:Yes  Follow-up services arranged: Home Health: [place] for HHPT/OT/aide, DME: [place] for bariatric [facility]in[facility] and Patient/Family request agency HH: [facility], DME: N/A  Comments (or additional information):  Patient/Family verbalized understanding of follow-up arrangements: Yes  Individual responsible for coordination of the follow-up plan: contact pt #[phone] or pt mother Silva Bandy [phone]  Confirmed correct DME delivered: Rana Snare 09/09/2020    Rana Snare

## 2020-09-10 LAB — GLUCOSE, CAPILLARY
Glucose-Capillary: 125 mg/dL — ABNORMAL HIGH (ref 70–99)
Glucose-Capillary: 125 mg/dL — ABNORMAL HIGH (ref 70–99)
Glucose-Capillary: 116 mg/dL — ABNORMAL HIGH (ref 70–99)
Glucose-Capillary: 181 mg/dL — ABNORMAL HIGH (ref 70–99)

## 2020-09-10 NOTE — Patient Care Conference (Signed)
Inpatient RehabilitationTeam Conference and Plan of Care Update Date: 09/10/2020   Time: 11:16 AM    Patient Name: Carl Hill      Medical Record Number: [id number]  Date of Birth: 1971-01-18 Sex: Male         Room/Bed: 4W14C/4W14C-01 Payor Info: Payor: MEDICAID [place] / Plan: MEDICAID [place] ACCESS / Product Type: *No Product type* /    Admit Date/Time:  08/06/2020  1:01 AM  Primary Diagnosis:  Debility  Hospital Problems: Principal Problem:   Debility Active Problems:   Super-super obese (HCC)   Urinary retention   Hypokalemia   Labile blood glucose   Diabetic peripheral neuropathy (HCC)   Sacral pain   Hypoglycemia   Muscle spasms of both lower extremities   Recurrent knee instability, right   H/O small bowel obstruction   Muscle spasm    Expected Discharge Date: Expected Discharge Date: 09/12/20  Team Members Present: Physician leading conference: Dr. Genice Rouge Care Coodinator Present: Cecile Sheerer, LCSWA;Royalti Schauf Marlyne Beards, RN, BSN, CRRN Nurse Present: Kennyth Arnold, RN PT Present: Peter Congo, PT OT Present: Ardis Rowan, COTA;Jennifer Katrinka Blazing, OT PPS Coordinator present : Fae Pippin, SLP     Current Status/Progress Goal Weekly Team Focus  Bowel/Bladder   Foley cath remains for wound healing, incont. of bowels.SSE 4/8/ constipation LBM 4 /11/22  pt will be continent of bowel with normal bowel pattern foley remains in place,plan to discharge home with foley cath.  QS/PRN assessment if no BM within 2 days provide aggressive methods of treatment to prevent constipation   Swallow/Nutrition/ Hydration             ADL's   UB bahting/dressing-supervision seated in w/c or EOB; LB bathing-max A; LB dressing-mod A; toileting-max A; sit<>stand and functional transfers-CGA/supervisopm  min A/CGA overall  standing balance, functional transfers, education, discharge planning   Mobility   Supervision bed mobility with bedrail, CGA sit to stand with RW, gait up to  95 ft RW CGA, +2 for one 6" step with 2 handrails (unsafe, knees buckle)  min A overall  d/c planning, family education, LE strengthening and stair training as safe and able   Communication             Safety/Cognition/ Behavioral Observations            Pain   Patient continue to c/o scrotum and wound ( buttock) pain  Pain in buttocks and scrotum pt has liquid dilaudid  but refuse for now x 3 days during dressing changes, Continue Roxicodone prn , tylenol prn, and robaxin  Assess pain QS/PRN   Skin   Open buttocks wound healing well, tunneling improving. Wet/dry dressing changes continue  Assess QS.PRN improvement to wound        Discharge Planning:  Pt to d/c to mother's home. Mother to provide 24/7 care; pt sister to provide support when pt mother has surgery 4/29. [facility] for HHPT/SN/aide.   Team Discussion: Discontinued dilaudid, will continue bowel medications, would like foley changed before discharge. Nursing continues to educate on the importance of the bowel medications versus being constipated. Pain is controlled better, no sleep issues. Education is on-going with the surgical wound to the buttocks. Patient on target to meet rehab goals: yes, patient went up 5 steps and on the 6th step his knee buckled but they were able to assist him back to his wheelchair and he did not have a fall. He is ready for discharge.  *See Care Plan and progress notes  for long and short-term goals.   Revisions to Treatment Plan:  Not at this time.  Teaching Needs: Family education, medication management, pain management, skin/wound care, dressing changes, transfer training, gait training, stair training, balance training, endurance training, safety awareness, diabetes education.  Current Barriers to Discharge: Decreased caregiver support, Medical stability, Home enviroment access/layout, Incontinence, Wound care, Lack of/limited family support, Weight, Weight bearing restrictions,  Medication compliance and Behavior  Possible Resolutions to Barriers: Continue current medications, provide emotional support.     Medical Summary Current Status: needs foley changed- cleaned out bowel wise- pain controlled- post op wound on backside- healing slowly.  Barriers to Discharge: Behavior;Decreased family/caregiver support;Home enviroment access/layout;Other (comments);Weight;Wound care  Barriers to Discharge Comments: foley- has H/H- has to see Urology for foley change stairs biggest issue Possible Resolutions to [employer] Focus: stairs- is focus; knee buckling on stairs- not safe to do- looking into getting ramp- needs ramp- can't do steps. pt is 22ft 6inches- will change- d/c Thursday 4/14. At goal level for OT.   Continued Need for Acute Rehabilitation Level of Care: The patient requires daily medical management by a physician with specialized training in physical medicine and rehabilitation for the following reasons: Direction of a multidisciplinary physical rehabilitation program to maximize functional independence : Yes Medical management of patient stability for increased activity during participation in an intensive rehabilitation regime.: Yes Analysis of laboratory values and/or radiology reports with any subsequent need for medication adjustment and/or medical intervention. : Yes   I attest that I was present, lead the team conference, and concur with the assessment and plan of the team.   Tennis Must 09/10/2020, 5:52 PM

## 2020-09-10 NOTE — Progress Notes (Signed)
Occupational Therapy Session Note  Patient Details  Name: Carl Hill MRN: [id number] Date of Birth: 04-16-1971  Today's Date: 09/10/2020 OT Individual Time: 2683-4196 OT Individual Time Calculation (min): 55 min    Short Term Goals: Week 5:  OT Short Term Goal 1 (Week 5): STG=LTG 2/2 ELOS  Skilled Therapeutic Interventions/Progress Updates:    OT intervention with focus on standing balance, functional amb with RW in/out of bathroom, bathing at shower level, UB dressing and grooming seated in w/c at sink, activity tolerance, and safety awareness to increase independence with BADLs. Bathing with min A using long handle sponge. Pt did not don pants awaiting dressing change on buttocks. All bed mobility and functional transfers with RW at supervision level. Amb with RW to bathroom with supervision. Sit<>stand from EOB and w/c with supervision. Pt returned to bed awaiting dressing change. All needs within reach.   Therapy Documentation Precautions:  Precautions Precautions: Fall Precaution Comments: foley catheter, large buttocks wound, R heel wound Restrictions Weight Bearing Restrictions: No Pain:  Pt denies pain this morning   Therapy/Group: Individual Therapy  [name] 09/10/2020, 9:03 AM

## 2020-09-10 NOTE — Progress Notes (Signed)
Patient request wound dressing not be done until after he takes his morning shower at 0800, ABD pad applied after completion of ADL care , small BM

## 2020-09-10 NOTE — Progress Notes (Signed)
Physical Therapy Session Note  Patient Details  Name: Carl Hill MRN: [id number] Date of Birth: [birth year]  Today's Date: 09/10/2020 PT Individual Time: 1000-1100 PT Individual Time Calculation (min): 60 min   Short Term Goals: Week 5:  PT Short Term Goal 1 (Week 5): =LTG due to ELOS  Skilled Therapeutic Interventions/Progress Updates:    Pt received seated in w/c in hallway ready for therapy session. No complaints of pain this date. Sit to stand with CGA to RW and stair rails during session. ACE-wrapped B knees for stability. Ascend/descend 3" step with alt LE, 5 x 5 reps. Pt with no instances of buckling on 3" stairs this date. Ambulation 2 x 60 ft with RW and CGA for balance. Pt returned to bed at end of session at Supervision level. Pt left semi-reclined in bed with needs in reach.  Therapy Documentation Precautions:  Precautions Precautions: Fall Precaution Comments: foley catheter, large buttocks wound, R heel wound Restrictions Weight Bearing Restrictions: No    Therapy/Group: Individual Therapy   Peter Congo, PT, DPT, CSRS  09/10/2020, 12:12 PM

## 2020-09-10 NOTE — Progress Notes (Signed)
  Occupational Therapy Session Note  Patient Details  Name: Carl Hill MRN: [id number] Date of Birth: [birth year]  Today's Date: 09/10/2020 OT Individual Time: 1315-1345 OT Individual Time Calculation (min): 30 min    Short Term Goals: Week 5:  OT Short Term Goal 1 (Week 5): STG=LTG 2/2 ELOS  Skilled Therapeutic Interventions/Progress Updates:    OT intervention with focus on w/c mobility, sit<>stand, standing balance, functional transfers with RW, and safety awareness to increase independence with BADLs and prepare for discharge home 4/14. Pt sitting in w/c upon arrival. Pt transported by w/c for time mgmt to outisde courtyard. Sit<>stand from w/c with supervision. Continued ongoing discharge plans and recommendations for home schedule and BADLs. Pt returned to room and propelled w/c in room to position for transfer to EOB. Sit<>stand and stand pivot transfer with supervision. Pt required assistance threading foley bag through pants. Sit>supine with supervision. Pt able to repostion in bed with supervioin. Pt remained in bed with all needs wihtin reach.   Therapy Documentation Precautions:  Precautions Precautions: Fall Precaution Comments: foley catheter, large buttocks wound, R heel wound Restrictions Weight Bearing Restrictions: No Pain:  Pt denies pain this afternoon   Therapy/Group: Individual Therapy  [name] 09/10/2020, 2:32 PM

## 2020-09-10 NOTE — Progress Notes (Signed)
Patient ID: Carl Hill, male   DOB: [birth year], 50 y.o.   MRN: [id number]  SW met with pt in room to review updates from team conference, d/c remains 4/14, and review discharge. Pt aware SW has arranged ambulance for Thursday at [place], MSW, [place] Office: [phone] Cell: [phone] Fax: [phone]

## 2020-09-10 NOTE — Progress Notes (Signed)
PROGRESS NOTE   Subjective/Complaints:  Getting shower this AM- feeling good Said went up 5 steps yesterday but fell on 6th stair- doesn't have any real "damage" from fall, but notes R knee gave out.  Getting shower- just starting.   ROS:   Pt denies SOB, abd pain, CP, N/V/C/D, and vision changes  Objective:   No results found. Recent Labs    09/09/20 0719  WBC 7.6  HGB 11.6*  HCT 38.0*  PLT 242   Recent Labs    09/09/20 0719  NA 139  K 4.2  CL 103  CO2 29  GLUCOSE 152*  BUN 12  CREATININE 0.96  CALCIUM 9.2    Intake/Output Summary (Last 24 hours) at 09/10/2020 1005 Last data filed at 09/10/2020 0825 Gross per 24 hour  Intake 1200 ml  Output 2525 ml  Net -1325 ml        Physical Exam: Vital Signs Blood pressure (!) 156/84, pulse 89, temperature (!) 97.4 F (36.3 C), temperature source Oral, resp. rate 18, height 5\' 10"  (1.778 m), weight (!) 226.2 kg, SpO2 98 %.     General: awake, alert, appropriate, sitting in shower- about to get bathed; OT at side; NAD HENT: conjugate gaze; oropharynx moist CV: regular rate; no JVD Pulmonary: CTA B/L; no W/R/R- good air movement GI: soft, NT, ND, (+)BS Psychiatric: appropriate- less talkative Neurological: Ox3  Musc: No edema in extremities.  No tenderness in extremities. Motor: Bilateral upper extremities: 5/5 throughout, unchanged Bilateral lower extremities: Hip flexion grossly 4+/5, distally 5/5, unchanged  Assessment/Plan: 1. Functional deficits which require 3+ hours per day of interdisciplinary therapy in a comprehensive inpatient rehab setting.  Physiatrist is providing close team supervision and 24 hour management of active medical problems listed below.  Physiatrist and rehab team continue to assess barriers to discharge/monitor patient progress toward functional and medical goals  Care Tool:  Bathing    Body parts bathed by patient: Right  arm,Left arm,Chest,Abdomen,Front perineal area,Right upper leg,Left upper leg,Right lower leg,Left lower leg,Face   Body parts bathed by helper: Buttocks     Bathing assist Assist Level: Minimal Assistance - Patient > 75%     Upper Body Dressing/Undressing Upper body dressing   What is the patient wearing?: Pull over shirt    Upper body assist Assist Level: Independent    Lower Body Dressing/Undressing Lower body dressing      What is the patient wearing?: Incontinence brief     Lower body assist Assist for lower body dressing: Maximal Assistance - Patient 25 - 49%     Toileting Toileting    Toileting assist Assist for toileting: Dependent - Patient 0%     Transfers Chair/bed transfer  Transfers assist  Chair/bed transfer activity did not occur: Safety/medical concerns  Chair/bed transfer assist level: Contact Guard/Touching assist Chair/bed transfer assistive device:   Ambulation assist   Ambulation activity did not occur: Safety/medical concerns  Assist level: Contact Guard/Touching assist Assistive device: Walker-rolling Max distance: 61'   Walk 10 feet activity   Assist  Walk 10 feet activity did not occur: Safety/medical concerns  Assist level: Contact Guard/Touching assist Assistive device: Walker-rolling  Walk 50 feet activity   Assist Walk 50 feet with 2 turns activity did not occur:  (straight path only)  Assist level: Contact Guard/Touching assist Assistive device: Walker-rolling    Walk 150 feet activity   Assist Walk 150 feet activity did not occur: Safety/medical concerns         Walk 10 feet on uneven surface  activity   Assist Walk 10 feet on uneven surfaces activity did not occur: Safety/medical concerns         Wheelchair     Assist Will patient use wheelchair at discharge?: Yes Type of Wheelchair: Manual Wheelchair activity did not occur: Safety/medical  concerns  Wheelchair assist level: Supervision/Verbal cueing Max wheelchair distance: 100'    Wheelchair 50 feet with 2 turns activity    Assist    Wheelchair 50 feet with 2 turns activity did not occur: Safety/medical concerns   Assist Level: Supervision/Verbal cueing   Wheelchair 150 feet activity     Assist  Wheelchair 150 feet activity did not occur: Safety/medical concerns       Blood pressure (!) 156/84, pulse 89, temperature (!) 97.4 F (36.3 C), temperature source Oral, resp. rate 18, height 5\' 10"  (1.778 m), weight (!) 226.2 kg, SpO2 98 %.  Medical Problem List and Plan: 1.  Debility secondary to septic shock from necrotizing soft tissue infection of perianal, gluteal, and perineal tissues.  3/28- con't PT and PT- progressing- walked 100 ft this weekend, total- see if pt can get into shower?- Will change dressing afterwards 3/29- walked 45 ft at once yesterday- doing shower today- did dry run as well- con't PT and OT  -4/2- con't PT and OT- will see if can sit him up at EOB to eat- but cannot address utnil I speak with Cheri GuppyJennifer Bailey  4/6-continue PT and OT-we will discuss with team if we can extend him a little more-as of tomorrow 4 7 he will be here 30-days.  4/8- con't PT and OT- explained to pt he's already been here 30 days- that we need to complete what's needed to get him home, but to keep him, just to keep him, isn't appropriate, we need to make sure will make further functional gains- he wasn't happy with this, but I explained patients don't stay in CIR >30 days, because we are an ACUTE Rehab facility, not subacute.   Tentative discharge 4/12 will not be having home health services  4/11- got H/H finally- will d/c Thursday 4/14- and go home by Ambulance.   4/12- con't PT and OT- working mainly on stairs and ADLs currently- d/c Thursday 2.  Antithrombotics: -DVT/anticoagulation:  Pharmaceutical:   Xarelto             -antiplatelet therapy: N/A 3. Pain  Management:  Continue Lyrica and elavil for neuropathy.              Oxycontin 15 mg bid with 10 mg prn for breakthrough pain.   On robaxin 750 mg q6 hours prn  3/29- pain controlled on current regimen- con't regimen- will need to discuss with pt, will likely need to reduce pain meds prior to d/c to get it covered by insurance.   3/30- d/w pt- will not be able to send home on three opiates- asked him to think about it.   4/1- will d/w pt again next week- con't pain regimen for now 4/2-discussed at length oxycodone versus Dilaudid for home regimen; will change  his OxyContin to MS Contin 15 mg twice  a day; will wait on changing the Dilaudid versus oxycodone. Also added kpad for pain.   4/3-patient did not report any change in his overall pain with transition to MS Contin; continue regimen  4/5- having anal pain- from constant BMs- con't regimen  4/6-discussed using B and O suppositories for anal cramping when cleaning them out again over this weekend-also discussed using as little pain medicines as possible to try to reduce constipation.  4/8- added B&O suppositories- can increase frequency if needed, but keep prn; help rectal pain and don't cause constipation they same; also added Valium 5 mg q8 hours prn- also explained is NOT going home on Valium.  Will discontinue Pt states he will be discussing pain meds with Dr Berline Chough  tomorrow   4/11- will d/c Dilaudid and con't Oxy and MS Contin  4/12- d'c;d Dilaudid- changed to MS Contin and con't Oxy 10 mg q4 hours prn. Pain controlled 4. Mood: LCSW to follow for evaluation and support.              -antipsychotic agents: N/A 5. Neuropsych: This patient is capable of making decisions on his own behalf. 6. Skin/Wound Care: Air mattress for pressure relief measures.              --will need local care frequently to keep sacral wound bed clean.             --Continue foley for now.  Added hemorrhoidal cream to assist with any pain which might be  ano-rectal/hemorrhoidal  4/2-postop wound is healing; continue regimen  4/3-still requires the hemorrhoidal cream for anal pain  4/7- changed Anusol and tucks to after every BM  4/12- doing much better- con't regimen 7. Fluids/Electrolytes/Nutrition: Monitor I/Os.  8. Necrotizing fascitis: Has been on Augmentin since 02/11--> to continue for 6 months per ID 9. T2DM with neuropathy: Hgb A1c- 9.6 (down from 13 in Aug) Will continue to monitor BS ac/hs and use SSI for elevated BS             --used Farxiga, metformin and Victoza 1.8.             --Continue Levemir bid and titrate as indicated  -decreased novolog to 10 units TID with meals    CBG (last 3)  Recent Labs    09/09/20 1657 09/09/20 2027 09/10/20 0550  GLUCAP 71 118* 125*  Controlled 4/10  4/11- controlled overall- con't regimen  4/12- had 1 BG of 71- but otherwise great control- con't regimen 10 Hyponatremia:              Sodium 138 on 3/21, labs ordered for Monday 11. Hypokalemia:    Potassium 3.8 on 3/21  4/3-we will recheck his labs in the morning  4/5- K+ 3.8- con't regimen 12. Proximal A FIb: Monitor HR tid--continue Cardizem 240 mg and amiodarone for rate control.              --monitor for symptoms with increase in activity.  4/1- controlled- con't regimen 13. OIC:              Discontinued colace  4/2- will Give Sorbitol 60 ml and see if will poop- if not, will need to do Mg citrate again.    4/3-had multiple bowel movements yesterday-will add MiraLAX back scheduled  Improved   4/5- had enema yesterday and having constant BMs since then- will check another KUB to see how stool burden is doing  4/6-patient is still full of stool according to  the latest KUB-discussed with patient waiting till this weekend when he did not have therapy to clean him out further-increased his MiraLAX as well as added Senokot 1 tab twice daily-do not want to do today or tomorrow because we do not want to miss more therapy if we can  help  4/7- large BM last night- will get cleaned out more starting Friday/Saturday.   4/8- has large stool ball- refused soap suds enema- needs to get cleaned out- ordered Mg citrate today and followed by soap suds- they can change type of enema, but will need it. Large bowel movement on 4/9 in am , and 2 smaller BMs in the pm Cont Miralax daily, Senna S and colace 1 daily   4/11- will stop Dilaudid and con't bowel regimen  4/12- con't Miralax, Senakot S and Colace- doing better 14. Chronic right heel ulcer: Continue to paint wound daily with iodine.   3/16- is Stage II- healing  3/29- added vaseline daily- less, but still has peeling skin- asked him to scrub in shower today  3/30- pt scrubbed in shower- will con't to monitor- actual wound appears closed now.   3/31- resolved- will d/c wound care orders and place vaseline order nightly to B/L feet and cover with socks  4/1- asked nursing to put on Vaseline on feet before bed- placed order 15.  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 Will need bariatric equipment.  3/28- BMI down to 72  4/1- weight down to 227.6 kg- so down slightly- BMI down to 72.0 form 72.4- con't regimen\  4/2-patient's goal is to be 499 pounds by discharge-he really wants to go home on his current diabetic regimen and he actually wants to go home eating the meals he has eaten in the hospital.  Encouraged this.  4/8- BMI down slightly to 71.83  4/11- pt said got weighed this weekend- was 485.5 lbs-   4/12- Weight 226.2 and BMI down to 71.55- con't regimen 16. Trach site  3/15- resolved/healed 17. Foley for urinary retention  3/23- pt wants to go home with foley- will need f/u with Urology to get out- since bladder, being a muscle, will be weak. Will need bariatric BSC if at possible.  3/31- pt foley is required due to wounds and urinary retention- con't foley and monitor- con't [facility] baths   4/2-explained if patient cannot get home health, he will need to go to  the urologist to get the Foley changed once a month.  4/6-we will not be able to get home health therapy or nursing- so will need to set up with urology to make sure he can get his Foley changed monthly  4/12- will change foley before d/c so has some time until needs to be done.  18. Muscle spasms  See #3  3/29- doing better on Robaxin- con't regimen  4/2-mother feels that patient is back should be imaged with an MRI due to muscle spasms-I explained the muscle spasms are common when someone is working harder than they have for a while.  Also it is not midline back pain; do not think an MRI is necessary at this time.  19. Disposition:   At baseline, patient received significant assistance from his mother, aged 29. He required her assistance to wipe stool after BM due to body habitus. He was able to ambulate to bathroom and back on his own. Plan is for discharge home to father, who is a IT sales professional, and who can provide more physical support to patient.  Goal is ModA level for ambulation within his room.   3/22- pt insists going home with mother- says she can do wound care better- will go to fathers when mother has surgery.    3/23- mother's surgery on 4/29  3/30- moved d/c date to 4/12 per therapy.   4/7- will see if we need ot keep longer to get home safely- every pt can benefit from staying longer- but what's needed to get pt home.   4/8- will see next Monday if needs to be extended- base don d/w therapy, I'm not sure he will, to get home safely.    4/11- will go Home Thursday   LOS: 35 days A FACE TO FACE EVALUATION WAS PERFORMED  Carl Hill 09/10/2020, 10:05 AM

## 2020-09-11 LAB — GLUCOSE, CAPILLARY
Glucose-Capillary: 157 mg/dL — ABNORMAL HIGH (ref 70–99)
Glucose-Capillary: 130 mg/dL — ABNORMAL HIGH (ref 70–99)
Glucose-Capillary: 101 mg/dL — ABNORMAL HIGH (ref 70–99)
Glucose-Capillary: 136 mg/dL — ABNORMAL HIGH (ref 70–99)

## 2020-09-11 NOTE — Progress Notes (Signed)
Physical Therapy Session Note  Patient Details  Name: Carl Hill MRN: [id number] Date of Birth: 11-16-1970  Today's Date: 09/11/2020 PT Individual Time: 1000-1120 PT Individual Time Calculation (min): 80 min   Short Term Goals: Week 5:  PT Short Term Goal 1 (Week 5): =LTG due to ELOS  Skilled Therapeutic Interventions/Progress Updates:    Pt received seated in bed having dressing placed on buttocks wound by nursing, agreeable to PT session. No complaints of pain. Created HEP for patient while dressing change completed, see below:  Access Code: [facility] URL: [email or web address] Date: 09/11/2020 Prepared by: Peter Congo  Exercises Seated Elbow Extension with Self-Anchored Resistance - 1 x daily - 7 x weekly - 3 sets - 10 reps Seated Single Arm Elbow Flexion with Resistance - 1 x daily - 7 x weekly - 3 sets - 10 reps Standing Single Arm Shoulder PNF D1 Flexion with Anchored Resistance - 1 x daily - 7 x weekly - 3 sets - 10 reps Standing Shoulder Horizontal Abduction with Resistance - 1 x daily - 7 x weekly - 3 sets - 10 reps Seated Long Arc Quad - 1 x daily - 7 x weekly - 3 sets - 10 reps Seated March - 1 x daily - 7 x weekly - 3 sets - 10 reps Standing Knee Flexion - 1 x daily - 7 x weekly - 3 sets - 10 reps Standing Hip Flexion March - 1 x daily - 7 x weekly - 3 sets - 10 reps Mini Squat with Counter Support - 1 x daily - 7 x weekly - 3 sets - 10 reps  Pt then performs bed mobility at mod I level with use of bedrail. Sit to stand with Supervision and RW throughout session. Ambulation x 80', x 75', x 50' with RW and Supervision. ACE-wrapped B knees for improved stability. Pt left seated in w/c in room at end of session, needs in reach and mom present. Pt and mom with no further questions leading up to d/c home tomorrow.  Therapy Documentation Precautions:  Precautions Precautions: Fall Precaution Comments: foley catheter, large buttocks wound, R heel  wound Restrictions Weight Bearing Restrictions: No   Therapy/Group: Individual Therapy   Peter Congo, PT, DPT, CSRS  09/11/2020, 12:24 PM

## 2020-09-11 NOTE — Progress Notes (Signed)
Patient ID: Carl Hill, male   DOB: [birth year], 50 y.o.   MRN: [id number]   SW asked pt assigned RN to give pt additional wound care supplies until [facility] is able to come to the home. SW informed on [facility] ambulance pick up tomorrow at 9:30am. Discharge packet at front desk.   Cecile Sheerer, MSW, LCSWA Office: [phone] Cell: [phone] Fax: [phone]

## 2020-09-11 NOTE — Progress Notes (Signed)
PROGRESS NOTE   Subjective/Complaints:  Pt reports great BM this AM- large- just finished shower- no issues. Asking about going home with more supplies.   ROS:   Pt denies SOB, abd pain, CP, N/V/C/D, and vision changes   Objective:   No results found. Recent Labs    09/09/20 0719  WBC 7.6  HGB 11.6*  HCT 38.0*  PLT 242   Recent Labs    09/09/20 0719  NA 139  K 4.2  CL 103  CO2 29  GLUCOSE 152*  BUN 12  CREATININE 0.96  CALCIUM 9.2    Intake/Output Summary (Last 24 hours) at 09/11/2020 1949 Last data filed at 09/11/2020 1900 Gross per 24 hour  Intake 717 ml  Output 1525 ml  Net -808 ml        Physical Exam: Vital Signs Blood pressure 113/66, pulse 91, temperature 97.9 F (36.6 C), resp. rate 18, height 6\' 6"  (1.981 m), weight (!) 226.2 kg, SpO2 93 %.      General: awake, alert, appropriate, sitting in shower.; OT in room; mother at bedside;  NAD HENT: conjugate gaze; oropharynx moist CV: regular rate; no JVD Pulmonary: CTA B/L; no W/R/R- good air movement GI: soft, NT, ND, (+)BS- BMI 57.63 due to finding out pt is 63ft 6inch Psychiatric: appropriate Neurological: Ox3  Musc: No edema in extremities.  No tenderness in extremities. Motor: Bilateral upper extremities: 5/5 throughout, unchanged Bilateral lower extremities: Hip flexion grossly 4+/5, distally 5/5, unchanged  Assessment/Plan: 1. Functional deficits which require 3+ hours per day of interdisciplinary therapy in a comprehensive inpatient rehab setting.  Physiatrist is providing close team supervision and 24 hour management of active medical problems listed below.  Physiatrist and rehab team continue to assess barriers to discharge/monitor patient progress toward functional and medical goals  Care Tool:  Bathing    Body parts bathed by patient: Right arm,Left arm,Chest,Abdomen,Front perineal area,Right upper leg,Left upper  leg,Right lower leg,Left lower leg,Face   Body parts bathed by helper: Buttocks     Bathing assist Assist Level: Minimal Assistance - Patient > 75%     Upper Body Dressing/Undressing Upper body dressing   What is the patient wearing?: Pull over shirt    Upper body assist Assist Level: Independent    Lower Body Dressing/Undressing Lower body dressing      What is the patient wearing?: Pants     Lower body assist Assist for lower body dressing: Minimal Assistance - Patient > 75%     Toileting Toileting    Toileting assist Assist for toileting: Moderate Assistance - Patient 50 - 74%     Transfers Chair/bed transfer  Transfers assist  Chair/bed transfer activity did not occur: Safety/medical concerns  Chair/bed transfer assist level: Supervision/Verbal cueing Chair/bed transfer assistive device: 7f   Ambulation assist   Ambulation activity did not occur: Safety/medical concerns  Assist level: Supervision/Verbal cueing Assistive device: Walker-rolling Max distance: 95'   Walk 10 feet activity   Assist  Walk 10 feet activity did not occur: Safety/medical concerns  Assist level: Supervision/Verbal cueing Assistive device: Walker-rolling   Walk 50 feet activity   Assist Walk 50 feet with  2 turns activity did not occur:  (straight path only)  Assist level: Supervision/Verbal cueing Assistive device: Walker-rolling    Walk 150 feet activity   Assist Walk 150 feet activity did not occur: Safety/medical concerns         Walk 10 feet on uneven surface  activity   Assist Walk 10 feet on uneven surfaces activity did not occur: Safety/medical concerns         Wheelchair     Assist Will patient use wheelchair at discharge?: No Type of Wheelchair: Manual Wheelchair activity did not occur: Safety/medical concerns  Wheelchair assist level: Supervision/Verbal cueing Max wheelchair distance: 100'    Wheelchair  50 feet with 2 turns activity    Assist    Wheelchair 50 feet with 2 turns activity did not occur: Safety/medical concerns   Assist Level: Supervision/Verbal cueing   Wheelchair 150 feet activity     Assist  Wheelchair 150 feet activity did not occur: Safety/medical concerns       Blood pressure 113/66, pulse 91, temperature 97.9 F (36.6 C), resp. rate 18, height  (1.981 m), weight (!) 226.2 kg, SpO2 93 %.  Medical Problem List and Plan: 1.  Debility secondary to septic shock from necrotizing soft tissue infection of perianal, gluteal, and perineal tissues.  3/28- con't PT and PT- progressing- walked 100 ft this weekend, total- see if pt can get into shower?- Will change dressing afterwards 3/29- walked 45 ft at once yesterday- doing shower today- did dry run as well- con't PT and OT  -4/2- con't PT and OT- will see if can sit him up at EOB to eat- but cannot address utnil I speak with Cheri Guppy  4/6-continue PT and OT-we will discuss with team if we can extend him a little more-as of tomorrow 4 7 he will be here 30-days.  4/8- con't PT and OT- explained to pt he's already been here 30 days- that we need to complete what's needed to get him home, but to keep him, just to keep him, isn't appropriate, we need to make sure will make further functional gains- he wasn't happy with this, but I explained patients don't stay in CIR >30 days, because we are an ACUTE Rehab facility, not subacute.   Tentative discharge 4/12 will not be having home health services  4/11- got H/H finally- will d/c Thursday 4/14- and go home by Ambulance.   4/12- con't PT and OT- working mainly on stairs and ADLs currently- d/c Thursday  4/13- d/c tomorrow- finished family teaching today 2.  Antithrombotics: -DVT/anticoagulation:  Pharmaceutical:   Xarelto             -antiplatelet therapy: N/A 3. Pain Management:  Continue Lyrica and elavil for neuropathy.              Oxycontin 15 mg bid with  10 mg prn for breakthrough pain.   On robaxin 750 mg q6 hours prn  3/29- pain controlled on current regimen- con't regimen- will need to discuss with pt, will likely need to reduce pain meds prior to d/c to get it covered by insurance.   3/30- d/w pt- will not be able to send home on three opiates- asked him to think about it.   4/1- will d/w pt again next week- con't pain regimen for now 4/2-discussed at length oxycodone versus Dilaudid for home regimen; will change  his OxyContin to MS Contin 15 mg twice a day; will wait on changing the Dilaudid versus  oxycodone. Also added kpad for pain.   4/3-patient did not report any change in his overall pain with transition to MS Contin; continue regimen  4/5- having anal pain- from constant BMs- con't regimen  4/6-discussed using B and O suppositories for anal cramping when cleaning them out again over this weekend-also discussed using as little pain medicines as possible to try to reduce constipation.  4/8- added B&O suppositories- can increase frequency if needed, but keep prn; help rectal pain and don't cause constipation they same; also added Valium 5 mg q8 hours prn- also explained is NOT going home on Valium.  Will discontinue Pt states he will be discussing pain meds with Dr Berline Chough  tomorrow   4/11- will d/c Dilaudid and con't Oxy and MS Contin  4/12- d'c;d Dilaudid- changed to MS Contin and con't Oxy 10 mg q4 hours prn. Pain controlled  4/13- going home on current regimen- will try to wean after d/c. 4. Mood: LCSW to follow for evaluation and support.              -antipsychotic agents: N/A 5. Neuropsych: This patient is capable of making decisions on his own behalf. 6. Skin/Wound Care: Air mattress for pressure relief measures.              --will need local care frequently to keep sacral wound bed clean.             --Continue foley for now.  Added hemorrhoidal cream to assist with any pain which might be ano-rectal/hemorrhoidal  4/2-postop  wound is healing; continue regimen  4/3-still requires the hemorrhoidal cream for anal pain  4/7- changed Anusol and tucks to after every BM  4/12- doing much better- con't regimen 7. Fluids/Electrolytes/Nutrition: Monitor I/Os.  8. Necrotizing fascitis: Has been on Augmentin since 02/11--> to continue for 6 months per ID 9. T2DM with neuropathy: Hgb A1c- 9.6 (down from 13 in Aug) Will continue to monitor BS ac/hs and use SSI for elevated BS             --used Farxiga, metformin and Victoza 1.8.             --Continue Levemir bid and titrate as indicated  -decreased novolog to 10 units TID with meals    CBG (last 3)  Recent Labs    09/11/20 0644 09/11/20 1154 09/11/20 1704  GLUCAP 157* 130* 101*  4/13- BGs well controlle-d regimen working- will send hom eon insulin 10 Hyponatremia:              Sodium 138 on 3/21, labs ordered for Monday 11. Hypokalemia:    Potassium 3.8 on 3/21  4/3-we will recheck his labs in the morning  4/5- K+ 3.8- con't regimen 12. Proximal A FIb: Monitor HR tid--continue Cardizem 240 mg and amiodarone for rate control.              --monitor for symptoms with increase in activity.  4/1- controlled- con't regimen 13. OIC:   4/5- had enema yesterday and having constant BMs since then- will check another KUB to see how stool burden is doing  4/6-patient is still full of stool according to the latest KUB-discussed with patient waiting till this weekend when he did not have therapy to clean him out further-increased his MiraLAX as well as added Senokot 1 tab twice daily-do not want to do today or tomorrow because we do not want to miss more therapy if we can help  4/8- has large stool  ball- refused soap suds enema- needs to get cleaned out- ordered Mg citrate today and followed by soap suds- they can change type of enema, but will need it. Large bowel movement on 4/9 in am , and 2 smaller BMs in the pm Cont Miralax daily, Senna S and colace 1 daily   4/11- will  stop Dilaudid and con't bowel regimen  4/12- con't Miralax, Senakot S and Colace- doing better  4/13- large BM this AM- con't regimen 14. Chronic right heel ulcer: Continue to paint wound daily with iodine.   3/16- is Stage II- healing  3/29- added vaseline daily- less, but still has peeling skin- asked him to scrub in shower today  3/30- pt scrubbed in shower- will con't to monitor- actual wound appears closed now.   3/31- resolved- will d/c wound care orders and place vaseline order nightly to B/L feet and cover with socks  4/1- asked nursing to put on Vaseline on feet before bed- placed order 15.  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 Will need bariatric equipment.  3/28- BMI down to 72  4/1- weight down to 227.6 kg- so down slightly- BMI down to 72.0 form 72.4- con't regimen\  4/2-patient's goal is to be 499 pounds by discharge-he really wants to go home on his current diabetic regimen and he actually wants to go home eating the meals he has eaten in the hospital.  Encouraged this.  4/8- BMI down slightly to 71.83  4/11- pt said got weighed this weekend- was 485.5 lbs-   4/12- Weight 226.2 and BMI down to 71.55- con't regimen  4/13- actually 6 ft 6inches- BMI dropped to 57.63 due to real height placed in computer.  16. Trach site  3/15- resolved/healed 17. Foley for urinary retention  3/23- pt wants to go home with foley- will need f/u with Urology to get out- since bladder, being a muscle, will be weak. Will need bariatric BSC if at possible.  3/31- pt foley is required due to wounds and urinary retention- con't foley and monitor- con't [facility] baths   4/2-explained if patient cannot get home health, he will need to go to the urologist to get the Foley changed once a month.  4/6-we will not be able to get home health therapy or nursing- so will need to set up with urology to make sure he can get his Foley changed monthly  4/12- will change foley before d/c so has some time  until needs to be done.   4/13- will get changed today before d/c- placed order.  18. Muscle spasms  See #3  3/29- doing better on Robaxin- con't regimen  4/2-mother feels that patient is back should be imaged with an MRI due to muscle spasms-I explained the muscle spasms are common when someone is working harder than they have for a while.  Also it is not midline back pain; do not think an MRI is necessary at this time.  19. Disposition:   At baseline, patient received significant assistance from his mother, aged 49. He required her assistance to wipe stool after BM due to body habitus. He was able to ambulate to bathroom and back on his own. Plan is for discharge home to father, who is a IT sales professionalfirefighter, and who can provide more physical support to patient. Goal is ModA level for ambulation within his room.   3/22- pt insists going home with mother- says she can do wound care better- will go to fathers when mother has surgery.  3/23- mother's surgery on 4/29  3/30- moved d/c date to 4/12 per therapy.   4/7- will see if we need ot keep longer to get home safely- every pt can benefit from staying longer- but what's needed to get pt home.   4/8- will see next Monday if needs to be extended- base don d/w therapy, I'm not sure he will, to get home safely.    4/11- will go Home Thursday   LOS: 36 days A FACE TO FACE EVALUATION WAS PERFORMED  Bernarda Erck 09/11/2020, 7:49 PM

## 2020-09-11 NOTE — Progress Notes (Signed)
Physical Therapy Session Note  Patient Details  Name: Carl Hill MRN: [id number] Date of Birth: [birth year]  Today's Date: 09/11/2020 PT Individual Time: 1330-1415 PT Individual Time Calculation (min): 45 min   Short Term Goals: Week 5:  PT Short Term Goal 1 (Week 5): =LTG due to ELOS  Skilled Therapeutic Interventions/Progress Updates:    Patient received sidelying in bed, agreeable to PT. He reports intermittent back pain with movement, but did not rate. PT providing rest breaks, distractions and repositioning to assist with pain management. He was able to come to sit edge of bed with CGA. Rest break sitting edge of bed to allow back pain to subside. He was able to transfer to wc via stand pivot with RW and CGA/Supervision. PT transporting patient in wc to therapy gym for time management and energy conservation. Dynamic sitting balance completed in wc, sitting away from back for improved core activation. Wii bowling completed for dynamic element reaching outside BOS. Patient transferring back to bed via stand pivot with RW and supervision/CGA. Returning supine with supervision. Mom at bedside, call light within reach.   Therapy Documentation Precautions:  Precautions Precautions: Fall Precaution Comments: foley catheter, large buttocks wound, R heel wound Restrictions Weight Bearing Restrictions: No    Therapy/Group: Individual Therapy  Elizebeth Koller, PT, DPT, CBIS  09/11/2020, 7:44 AM

## 2020-09-11 NOTE — Progress Notes (Signed)
Occupational Therapy Session Note  Patient Details  Name: Carl Hill MRN: [id number] Date of Birth: 01/22/1971  Today's Date: 09/11/2020 OT Individual Time: 0800-0900 OT Individual Time Calculation (min): 60 min    Short Term Goals: Week 4:  OT Short Term Goal 1 - Progress (Week 4): Met OT Short Term Goal 2 (Week 4): Pt will thread pants over BLE using AE PRN OT Short Term Goal 2 - Progress (Week 4): Met OT Short Term Goal 3 (Week 4): Pt will perform stand pivot transfers to BSC with min A OT Short Term Goal 3 - Progress (Week 4): Met OT Short Term Goal 4 (Week 4): Pt will perform toileting tasks with max A  Skilled Therapeutic Interventions/Progress Updates:    Pt resting in bed upon arrival with mother present. OT intervention with focus on bed mobility, standing balance, bathing at shower level, UB dressing seated, functional amb with RW in/out of bathroom and in room, ongoing discharge planning, education, and safety awareness. Bed mobility and amb with RW to bathroom with supervision. Bathing seated on BSC in shower with min A using long handle sponge. UB dressing and grooming mod I/I seated in w/c at sink. Pt at goal level and ready for discharge home tomorrow.   Therapy Documentation Precautions:  Precautions Precautions: Fall Precaution Comments: foley catheter, large buttocks wound, R heel wound Restrictions Weight Bearing Restrictions: No Pain:  Pt denies pain this morning   Therapy/Group: Individual Therapy  Lanier, Thomas Chappell 09/11/2020, 9:03 AM 

## 2020-09-11 NOTE — Progress Notes (Signed)
Physical Therapy Discharge Summary  Patient Details  Name: Carl Hill MRN: [id number] Date of Birth: [birth year]  Today's Date: 09/11/2020  Patient has met 6 of 8 long term goals due to improved activity tolerance, improved balance, improved postural control, increased strength, decreased pain and ability to compensate for deficits.  Patient to discharge at an ambulatory level Supervision.   Patient's care partner is independent to provide the necessary physical assistance at discharge. Pt's mom has completed family education.  Reasons goals not met: Pt did not meet ambulation goal of 100 ft, is able to ambulate up to 95 ft so has met goal adequately for safe d/c home. Pt did not meet stair goal due to frequent LE buckling when stair navigation attempted. Educated patient that he is not safe to perform stairs at this time, will need a ramp for safety at home. Pt to take medical transport home due to inability to safely perform stairs at this time.  Recommendation:  Patient will benefit from ongoing skilled PT services in home health setting to continue to advance safe functional mobility, address ongoing impairments in endurance, strength, balance, safety, independence with functional mobility, and minimize fall risk.  Equipment: No equipment provided  Reasons for discharge: treatment goals met and discharge from hospital  Patient/family agrees with progress made and goals achieved: Yes  PT Discharge Precautions/Restrictions Precautions Precautions: Fall Precaution Comments: foley catheter, large buttocks wound Restrictions Weight Bearing Restrictions: No Vision/Perception  Perception Perception: Within Functional Limits Praxis Praxis: Intact  Cognition Overall Cognitive Status: Within Functional Limits for tasks assessed Arousal/Alertness: Awake/alert Orientation Level: Oriented X4 Attention: Focused;Sustained Focused Attention: Appears intact Sustained Attention: Appears  intact Memory: Appears intact Awareness: Appears intact Problem Solving: Appears intact Safety/Judgment: Appears intact Sensation Sensation Light Touch: Impaired Detail Light Touch Impaired Details: Impaired RLE;Impaired LLE (neuropathy at baseline) Proprioception: Impaired Detail Proprioception Impaired Details: Impaired RLE;Impaired LLE Coordination Gross Motor Movements are Fluid and Coordinated: Yes Fine Motor Movements are Fluid and Coordinated: Yes Motor  Motor Motor: Within Functional Limits Motor - Discharge Observations: ongoing BLE weakness, improved since eval  Mobility Bed Mobility Bed Mobility: Rolling Right;Rolling Left;Supine to Sit;Sit to Supine Rolling Right: Independent with assistive device Rolling Left: Independent with assistive device Supine to Sit: Independent with assistive device Sit to Supine: Independent with assistive device Transfers Transfers: Sit to Stand;Stand Pivot Transfers Sit to Stand: Supervision/Verbal cueing Stand Pivot Transfers: Supervision/Verbal cueing Stand Pivot Transfer Details: Verbal cues for precautions/safety Transfer (Assistive device): Rolling walker Locomotion  Gait Ambulation: Yes Gait Assistance: Supervision/Verbal cueing Gait Distance (Feet): 95 Feet Assistive device: Rolling walker Gait Assistance Details: Verbal cues for precautions/safety Gait Gait: Yes Gait Pattern: Impaired Gait Pattern: Decreased hip/knee flexion - right;Decreased hip/knee flexion - left;Decreased dorsiflexion - right;Decreased dorsiflexion - left;Right foot flat;Left foot flat;Wide base of support Gait velocity: decreased Stairs / Additional Locomotion Stairs: Yes Stairs Assistance: Contact Guard/Touching assist Stair Management Technique: Two rails;Step to pattern Number of Stairs: 5 Height of Stairs: 6 Wheelchair Mobility Wheelchair Mobility: No  Trunk/Postural Assessment  Cervical Assessment Cervical Assessment: Within Functional  Limits Thoracic Assessment Thoracic Assessment: Within Functional Limits Lumbar Assessment Lumbar Assessment: Exceptions to [facility] (some R lower back spasms with activity) Postural Control Postural Control: Deficits on evaluation Postural Limitations: limited due to lower back pain and spasms  Balance Balance Balance Assessed: Yes Static Sitting Balance Static Sitting - Balance Support: No upper extremity supported;Feet supported Static Sitting - Level of Assistance: 6: Modified independent (Device/Increase time) Dynamic Sitting Balance Dynamic  Sitting - Balance Support: No upper extremity supported;Feet supported;During functional activity Dynamic Sitting - Level of Assistance: 5: Stand by assistance Static Standing Balance Static Standing - Balance Support: Bilateral upper extremity supported;During functional activity Static Standing - Level of Assistance: 5: Stand by assistance Dynamic Standing Balance Dynamic Standing - Balance Support: Bilateral upper extremity supported;During functional activity Dynamic Standing - Level of Assistance: 5: Stand by assistance Extremity Assessment   RLE Assessment RLE Assessment: Exceptions to [facility] General Strength Comments: see below RLE Strength RLE Overall Strength: Within Functional Limits for tasks assessed Right Hip Flexion: 4+/5 Right Knee Flexion: 5/5 Right Knee Extension: 5/5 Right Ankle Dorsiflexion: 2/5 LLE Assessment LLE Assessment: Exceptions to [facility] General Strength Comments: see below LLE Strength LLE Overall Strength: Within Functional Limits for tasks assessed Left Hip Flexion: 4+/5 Left Knee Flexion: 5/5 Left Knee Extension: 5/5 Left Ankle Dorsiflexion: 3/5     Excell Seltzer, PT, DPT, CSRS 09/11/2020, 12:31 PM

## 2020-09-11 NOTE — Progress Notes (Signed)
Occupational Therapy Discharge Summary  Patient Details  Name: Carl Hill MRN: [id number] Date of Birth: December 09, 1970  Patient has met 50 of 11 long term goals due to improved activity tolerance, improved balance, ability to compensate for deficits and functional use of  RIGHT lower and LEFT lower extremity.  Pt made steady progress with BADLs and functional transfers during this admission. Pt requires min A for bathing seated EOB or at shower level using AE PRN. Pt requires min A for LB dressing and is independent for UB dressing tasks. Grooming seated in w/c at sink independent. Functional transfers with CGA/supervision using RW. Pt's mother has been present for therapy sessions[facility]  Patient's care partner is independent to provide the necessary physical assistance at discharge.     Recommendation:  Patient will benefit from ongoing skilled OT services in home health setting to continue to advance functional skills in the area of BADL and Reduce care partner burden.  Equipment: bariatric BSC  Reasons for discharge: treatment goals met and discharge from hospital  Patient/family agrees with progress made and goals achieved: Yes  OT Discharge Vision Baseline Vision/History: No visual deficits Patient Visual Report: No change from baseline Vision Assessment?: No apparent visual deficits Perception  Perception: Within Functional Limits Praxis Praxis: Intact Cognition Overall Cognitive Status: Within Functional Limits for tasks assessed Arousal/Alertness: Awake/alert Orientation Level: Oriented X4 Attention: Focused;Sustained Focused Attention: Appears intact Sustained Attention: Appears intact Memory: Appears intact Awareness: Appears intact Problem Solving: Appears intact Safety/Judgment: Appears intact Sensation Sensation Light Touch: Impaired Detail Light Touch Impaired Details: Impaired RLE;Impaired LLE Additional Comments:  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 Coordination Gross Motor Movements are Fluid and Coordinated: Yes Fine Motor Movements are Fluid and Coordinated: Yes Motor  Motor Motor: Abnormal postural alignment and control;Abnormal tone Motor - Skilled Clinical Observations: Generalized weakness Trunk/Postural Assessment  Cervical Assessment Cervical Assessment: Within Functional Limits Thoracic Assessment Thoracic Assessment: Within Functional Limits Lumbar Assessment Lumbar Assessment: Exceptions to [facility] (some spasms with activity)  Balance Static Sitting Balance Static Sitting - Balance Support: Feet supported Static Sitting - Level of Assistance: 6: Modified independent (Device/Increase time) Dynamic Sitting Balance Dynamic Sitting - Balance Support: During functional activity Dynamic Sitting - Level of Assistance: 5: Stand by assistance Extremity/Trunk Assessment RUE Assessment RUE Assessment: Within Functional Limits General Strength Comments: 4+/5 overall LUE Assessment LUE Assessment: Within Functional Limits General Strength Comments: 4+/5   [name] 09/11/2020, 6:30 AM

## 2020-09-12 LAB — GLUCOSE, CAPILLARY: Glucose-Capillary: 138 mg/dL — ABNORMAL HIGH (ref 70–99)

## 2020-09-12 MED ORDER — CALCIUM POLYCARBOPHIL 625 MG PO TABS: 625.0000 mg | ORAL_TABLET | Freq: Every day | ORAL | 0 refills | Status: DC

## 2020-09-12 MED ORDER — DOCUSATE SODIUM 100 MG PO CAPS: 100.0000 mg | ORAL_CAPSULE | Freq: Every day | ORAL | 0 refills | Status: DC

## 2020-09-12 MED ORDER — AMIODARONE HCL 200 MG PO TABS: 200.0000 mg | ORAL_TABLET | Freq: Every day | ORAL | 0 refills | Status: DC

## 2020-09-12 MED ORDER — PANTOPRAZOLE SODIUM 40 MG PO TBEC: 40.0000 mg | DELAYED_RELEASE_TABLET | Freq: Every day | ORAL | 0 refills | Status: DC

## 2020-09-12 MED ORDER — NORTRIPTYLINE HCL 50 MG PO CAPS: 50.0000 mg | ORAL_CAPSULE | Freq: Two times a day (BID) | ORAL | 0 refills | Status: DC

## 2020-09-12 MED ORDER — COMFORT EZ PEN NEEDLES 32G X 6 MM MISC: 1.0000 | Freq: Three times a day (TID) | 0 refills | Status: DC

## 2020-09-12 MED ORDER — ATORVASTATIN CALCIUM 20 MG PO TABS: 20.0000 mg | ORAL_TABLET | Freq: Every evening | ORAL | 0 refills | Status: DC

## 2020-09-12 MED ORDER — POLYETHYLENE GLYCOL 3350 17 G PO PACK: 17.0000 g | PACK | Freq: Every day | ORAL | 0 refills | Status: AC

## 2020-09-12 MED ORDER — RIVAROXABAN 20 MG PO TABS: 20.0000 mg | ORAL_TABLET | Freq: Every day | ORAL | 0 refills | Status: AC

## 2020-09-12 MED ORDER — INSULIN DETEMIR 100 UNIT/ML ~~LOC~~ SOLN: 40.0000 [IU] | Freq: Two times a day (BID) | SUBCUTANEOUS | 11 refills | Status: DC

## 2020-09-12 MED ORDER — PREGABALIN 300 MG PO CAPS: 300.0000 mg | ORAL_CAPSULE | Freq: Two times a day (BID) | ORAL | 0 refills | Status: DC

## 2020-09-12 MED ORDER — MORPHINE SULFATE ER 15 MG PO TBCR: 15.0000 mg | EXTENDED_RELEASE_TABLET | Freq: Two times a day (BID) | ORAL | 0 refills | Status: DC

## 2020-09-12 MED ORDER — AMOXICILLIN-POT CLAVULANATE 875-125 MG PO TABS: 1.0000 | ORAL_TABLET | Freq: Three times a day (TID) | ORAL | 0 refills | Status: DC

## 2020-09-12 MED ORDER — INSULIN ASPART 100 UNIT/ML FLEXPEN: 10.0000 [IU] | PEN_INJECTOR | Freq: Three times a day (TID) | SUBCUTANEOUS | 11 refills | Status: DC

## 2020-09-12 MED ORDER — DILTIAZEM HCL ER COATED BEADS 240 MG PO CP24: 240.0000 mg | ORAL_CAPSULE | Freq: Every day | ORAL | 0 refills | Status: DC

## 2020-09-12 MED ORDER — METHOCARBAMOL 750 MG PO TABS: 750.0000 mg | ORAL_TABLET | Freq: Four times a day (QID) | ORAL | 0 refills | Status: DC | PRN

## 2020-09-12 MED ORDER — SENNOSIDES-DOCUSATE SODIUM 8.6-50 MG PO TABS: 1.0000 | ORAL_TABLET | Freq: Two times a day (BID) | ORAL | 0 refills | Status: DC

## 2020-09-12 MED ORDER — HYDROCORTISONE (PERIANAL) 2.5 % EX CREA: TOPICAL_CREAM | Freq: Four times a day (QID) | CUTANEOUS | 4 refills | Status: DC

## 2020-09-12 MED ORDER — OXYCODONE HCL 10 MG PO TABS: 10.0000 mg | ORAL_TABLET | Freq: Four times a day (QID) | ORAL | 0 refills | Status: DC | PRN

## 2020-09-12 MED ORDER — INSULIN DETEMIR 100 UNIT/ML FLEXPEN: 40.0000 [IU] | PEN_INJECTOR | Freq: Two times a day (BID) | SUBCUTANEOUS | 11 refills | Status: DC

## 2020-09-12 NOTE — Progress Notes (Addendum)
PROGRESS NOTE   Subjective/Complaints:  LBM overnight- taking ambulance today at go home.  Will set Urology- for Foley change- since H/H will not change foley.  Will change foley this AM.   Will reduce his MS Contin to 1x/day in a few days- and then try to wean off opiates overall in the next month.   ROS:   Pt denies SOB, abd pain, CP, N/V/C/D, and vision changes  Objective:   No results found. No results for input(s): WBC, HGB, HCT, PLT in the last 72 hours. No results for input(s): NA, K, CL, CO2, GLUCOSE, BUN, CREATININE, CALCIUM in the last 72 hours.  Intake/Output Summary (Last 24 hours) at 09/12/2020 0856 Last data filed at 09/12/2020 0700 Gross per 24 hour  Intake 477 ml  Output 3600 ml  Net -3123 ml        Physical Exam: Vital Signs Blood pressure 109/70, pulse 94, temperature 98.1 F (36.7 C), resp. rate 18, height 6\' 6"  (1.981 m), weight (!) 226.2 kg, SpO2 96 %.       General: awake, alert, appropriate, sitting up in low air loss bariatric bed;  NAD HENT: conjugate gaze; oropharynx moist CV: regular rate; no JVD Pulmonary: CTA B/L; no W/R/R- good air movement GI: soft, NT, ND, (+)BS Psychiatric: appropriate- but asking for more pain meds at home.  Neurological: Ox3  Musc: No edema in extremities.  No tenderness in extremities. Motor: Bilateral upper extremities: 5/5 throughout, unchanged Bilateral lower extremities: Hip flexion grossly 4+/5, distally 5/5, unchanged  Assessment/Plan: 1. Functional deficits which require 3+ hours per day of interdisciplinary therapy in a comprehensive inpatient rehab setting.  Physiatrist is providing close team supervision and 24 hour management of active medical problems listed below.  Physiatrist and rehab team continue to assess barriers to discharge/monitor patient progress toward functional and medical goals  Care Tool:  Bathing    Body parts bathed  by patient: Right arm,Left arm,Chest,Abdomen,Front perineal area,Right upper leg,Left upper leg,Right lower leg,Left lower leg,Face   Body parts bathed by helper: Buttocks     Bathing assist Assist Level: Minimal Assistance - Patient > 75%     Upper Body Dressing/Undressing Upper body dressing   What is the patient wearing?: Pull over shirt    Upper body assist Assist Level: Independent    Lower Body Dressing/Undressing Lower body dressing      What is the patient wearing?: Pants     Lower body assist Assist for lower body dressing: Minimal Assistance - Patient > 75%     Toileting Toileting    Toileting assist Assist for toileting: Moderate Assistance - Patient 50 - 74%     Transfers Chair/bed transfer  Transfers assist  Chair/bed transfer activity did not occur: Safety/medical concerns  Chair/bed transfer assist level: Supervision/Verbal cueing Chair/bed transfer assistive device: Geologist, engineeringWalker   Locomotion Ambulation   Ambulation assist   Ambulation activity did not occur: Safety/medical concerns  Assist level: Supervision/Verbal cueing Assistive device: Walker-rolling Max distance: 95'   Walk 10 feet activity   Assist  Walk 10 feet activity did not occur: Safety/medical concerns  Assist level: Supervision/Verbal cueing Assistive device: Walker-rolling   Walk 50 feet activity  Assist Walk 50 feet with 2 turns activity did not occur:  (straight path only)  Assist level: Supervision/Verbal cueing Assistive device: Walker-rolling    Walk 150 feet activity   Assist Walk 150 feet activity did not occur: Safety/medical concerns         Walk 10 feet on uneven surface  activity   Assist Walk 10 feet on uneven surfaces activity did not occur: Safety/medical concerns         Wheelchair     Assist Will patient use wheelchair at discharge?: No Type of Wheelchair: Manual Wheelchair activity did not occur: Safety/medical  concerns  Wheelchair assist level: Supervision/Verbal cueing Max wheelchair distance: 100'    Wheelchair 50 feet with 2 turns activity    Assist    Wheelchair 50 feet with 2 turns activity did not occur: Safety/medical concerns   Assist Level: Supervision/Verbal cueing   Wheelchair 150 feet activity     Assist  Wheelchair 150 feet activity did not occur: Safety/medical concerns       Blood pressure 109/70, pulse 94, temperature 98.1 F (36.7 C), resp. rate 18, height  (1.981 m), weight (!) 226.2 kg, SpO2 96 %.  Medical Problem List and Plan: 1.  Debility secondary to septic shock from necrotizing soft tissue infection of perianal, gluteal, and perineal tissues.  3/28- con't PT and PT- progressing- walked 100 ft this weekend, total- see if pt can get into shower?- Will change dressing afterwards 3/29- walked 45 ft at once yesterday- doing shower today- did dry run as well- con't PT and OT  -4/2- con't PT and OT- will see if can sit him up at EOB to eat- but cannot address utnil I speak with Cheri Guppy  4/6-continue PT and OT-we will discuss with team if we can extend him a little more-as of tomorrow 4 7 he will be here 30-days.  4/8- con't PT and OT- explained to pt he's already been here 30 days- that we need to complete what's needed to get him home, but to keep him, just to keep him, isn't appropriate, we need to make sure will make further functional gains- he wasn't happy with this, but I explained patients don't stay in CIR >30 days, because we are an ACUTE Rehab facility, not subacute.   Tentative discharge 4/12 will not be having home health services  4/11- got H/H finally- will d/c Thursday 4/14- and go home by Ambulance.   4/12- con't PT and OT- working mainly on stairs and ADLs currently- d/c Thursday  4/13- d/c tomorrow- finished family teaching today  4/14- d/c today- also changing foley today before d/c.  2.  Antithrombotics: -DVT/anticoagulation:   Pharmaceutical:   Xarelto             -antiplatelet therapy: N/A 3. Pain Management:  Continue Lyrica and elavil for neuropathy.              Oxycontin 15 mg bid with 10 mg prn for breakthrough pain.   On robaxin 750 mg q6 hours prn  3/29- pain controlled on current regimen- con't regimen- will need to discuss with pt, will likely need to reduce pain meds prior to d/c to get it covered by insurance.   3/30- d/w pt- will not be able to send home on three opiates- asked him to think about it.   4/1- will d/w pt again next week- con't pain regimen for now 4/2-discussed at length oxycodone versus Dilaudid for home regimen; will change  his  OxyContin to MS Contin 15 mg twice a day; will wait on changing the Dilaudid versus oxycodone. Also added kpad for pain.   4/3-patient did not report any change in his overall pain with transition to MS Contin; continue regimen  4/5- having anal pain- from constant BMs- con't regimen  4/6-discussed using B and O suppositories for anal cramping when cleaning them out again over this weekend-also discussed using as little pain medicines as possible to try to reduce constipation.  4/14- will wean to MS Contin 15 mg daily x 1 week, then try to complete get off MS Contin- taking Oxy 1-2x/day- will try to wean by f/u  4. Mood: LCSW to follow for evaluation and support.              -antipsychotic agents: N/A 5. Neuropsych: This patient is capable of making decisions on his own behalf. 6. Skin/Wound Care: Air mattress for pressure relief measures.              --will need local care frequently to keep sacral wound bed clean.             --Continue foley for now.  Added hemorrhoidal cream to assist with any pain which might be ano-rectal/hemorrhoidal  4/2-postop wound is healing; continue regimen  4/3-still requires the hemorrhoidal cream for anal pain  4/7- changed Anusol and tucks to after every BM  4/12- doing much better- con't regimen 7.  Fluids/Electrolytes/Nutrition: Monitor I/Os.  8. Necrotizing fascitis: Has been on Augmentin since 02/11--> to continue for 6 months per ID 9. T2DM with neuropathy: Hgb A1c- 9.6 (down from 13 in Aug) Will continue to monitor BS ac/hs and use SSI for elevated BS             --used Farxiga, metformin and Victoza 1.8.             --Continue Levemir bid and titrate as indicated  -decreased novolog to 10 units TID with meals    CBG (last 3)  Recent Labs    09/11/20 1704 09/11/20 2101 09/12/20 0712  GLUCAP 101* 136* 138*  4/13- BGs well controlle-d regimen working- will send hom eon insulin  4/14- said he can inject insulin- so will send home on current regimen 10 Hyponatremia:              Sodium 138 on 3/21, labs ordered for Monday 11. Hypokalemia:    Potassium 3.8 on 3/21  4/3-we will recheck his labs in the morning  4/5- K+ 3.8- con't regimen 12. Proximal A FIb: Monitor HR tid--continue Cardizem 240 mg and amiodarone for rate control.              --monitor for symptoms with increase in activity.  4/1- controlled- con't regimen 13. OIC:   4/5- had enema yesterday and having constant BMs since then- will check another KUB to see how stool burden is doing  4/6-patient is still full of stool according to the latest KUB-discussed with patient waiting till this weekend when he did not have therapy to clean him out further-increased his MiraLAX as well as added Senokot 1 tab twice daily-do not want to do today or tomorrow because we do not want to miss more therapy if we can help  4/8- has large stool ball- refused soap suds enema- needs to get cleaned out- ordered Mg citrate today and followed by soap suds- they can change type of enema, but will need it. Large bowel movement on 4/9 in am ,  and 2 smaller BMs in the pm Cont Miralax daily, Senna S and colace 1 daily   4/11- will stop Dilaudid and con't bowel regimen  4/12- con't Miralax, Senakot S and Colace- doing better  4/13- large BM this  AM- con't regimen  4/14- large BM o/n- con't bowel meds 14. Chronic right heel ulcer: Continue to paint wound daily with iodine.   3/16- is Stage II- healing  3/29- added vaseline daily- less, but still has peeling skin- asked him to scrub in shower today  3/30- pt scrubbed in shower- will con't to monitor- actual wound appears closed now.   3/31- resolved- will d/c wound care orders and place vaseline order nightly to B/L feet and cover with socks  4/1- asked nursing to put on Vaseline on feet before bed- placed order 15.  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 Will need bariatric equipment.  3/28- BMI down to 72  4/1- weight down to 227.6 kg- so down slightly- BMI down to 72.0 form 72.4- con't regimen\  4/2-patient's goal is to be 499 pounds by discharge-he really wants to go home on his current diabetic regimen and he actually wants to go home eating the meals he has eaten in the hospital.  Encouraged this.  4/8- BMI down slightly to 71.83  4/11- pt said got weighed this weekend- was 485.5 lbs-   4/12- Weight 226.2 and BMI down to 71.55- con't regimen  4/13- actually 6 ft 6inches- BMI dropped to 57.63 due to real height placed in computer.  16. Trach site  3/15- resolved/healed 17. Foley for urinary retention  3/23- pt wants to go home with foley- will need f/u with Urology to get out- since bladder, being a muscle, will be weak. Will need bariatric BSC if at possible.  3/31- pt foley is required due to wounds and urinary retention- con't foley and monitor- con't [facility] baths   4/2-explained if patient cannot get home health, he will need to go to the urologist to get the Foley changed once a month.  4/6-we will not be able to get home health therapy or nursing- so will need to set up with urology to make sure he can get his Foley changed monthly  4/12- will change foley before d/c so has some time until needs to be done.   4/13- will get changed today before d/c- placed order.   18. Muscle spasms  See #3  3/29- doing better on Robaxin- con't regimen  4/2-mother feels that patient is back should be imaged with an MRI due to muscle spasms-I explained the muscle spasms are common when someone is working harder than they have for a while.  Also it is not midline back pain; do not think an MRI is necessary at this time.  19. Disposition:   At baseline, patient received significant assistance from his mother, aged 66. He required her assistance to wipe stool after BM due to body habitus. He was able to ambulate to bathroom and back on his own. Plan is for discharge home to father, who is a IT sales professional, and who can provide more physical support to patient. Goal is ModA level for ambulation within his room.   3/22- pt insists going home with mother- says she can do wound care better- will go to fathers when mother has surgery.    3/23- mother's surgery on 4/29  3/30- moved d/c date to 4/12 per therapy.   4/7- will see if we need ot keep longer to  get home safely- every pt can benefit from staying longer- but what's needed to get pt home.   4/8- will see next Monday if needs to be extended- base don d/w therapy, I'm not sure he will, to get home safely.    4/11- will go Home Thursday  4/13- wound care to see him in 10 days; getting H/H- will schedule Urology for foley; change foley today; Surgery sees no need to f/u with pt at this time- so will f/u with pt, but need to get him off pain meds within the next month- surgery sees no reason pt still on opiates.    I spent a total of 35 minutes going over d/c meds and d/c plans with f/u and discussing pain meds with PA who called surgery to discuss plan- also deciding to wean off Ms contin ASAP and trying to get off pain meds within 1 month.   LOS: 37 days A FACE TO FACE EVALUATION WAS PERFORMED  Carl Hill 09/12/2020, 8:56 AM

## 2020-09-12 NOTE — Discharge Summary (Signed)
Physician Discharge Summary  Patient ID: [name] MRN: [id number] DOB/AGE: [birth year] 50 y.o.  Admit date: 08/06/2020 Discharge date: 09/12/2020  Discharge Diagnoses:  Principal Problem:   Debility Active Problems:   Super-super obese (HCC)   Urinary retention   Diabetic peripheral neuropathy (HCC)   Sacral pain   Muscle spasms of both lower extremities   Recurrent knee instability, right   H/O small bowel obstruction   Discharged Condition: stable   Significant Diagnostic Studies: DG Abd 1 View  Result Date: 09/03/2020 CLINICAL DATA:  Constipation, pain in rectum, diabetes mellitus, multiple prior abdominal surgeries EXAM: ABDOMEN - 1 VIEW COMPARISON:  None FINDINGS: Increased stool throughout colon and rectum. No bowel dilatation or bowel wall thickening. Bones demineralized without focal abnormality. No urinary tract calcification. IMPRESSION: Increased stool throughout colon and rectum. Electronically Signed   By: Ulyses Southward M.D.   On: 09/03/2020 14:26   DG Abd 1 View  Result Date: 09/02/2020 CLINICAL DATA:  Rectal pain EXAM: ABDOMEN - 1 VIEW COMPARISON:  08/16/2020 FINDINGS: Stool is present throughout the colon as noted on previous study. Air-filled nondilated loops of small bowel. Surgical clips left upper quadrant. No acute osseous abnormality. IMPRESSION: Nonobstructive bowel gas pattern. There is stool throughout the colon suggest constipation and possibly impaction. Electronically Signed   By: Guadlupe Spanish M.D.   On: 09/02/2020 16:56   DG Abd 1 View  Result Date: 08/16/2020 CLINICAL DATA:  50 year old male with abdominal pain and constipation. Query obstruction. EXAM: ABDOMEN - 1 VIEW COMPARISON:  CT Abdomen and Pelvis 08/02/2020 and earlier. FINDINGS: Five AP supine views of the abdomen and pelvis demonstrate a large volume of retained stool throughout the colon similar to the recent CT. Gas-filled nondilated small bowel in the abdomen, nonobstructed pattern.  Stable surgical clips in the left upper quadrant. No acute osseous abnormality identified. IMPRESSION: Non obstructed bowel gas pattern. Retained stool throughout the colon not improved since 08/02/2020 CT. Electronically Signed   By: Odessa Fleming M.D.   On: 08/16/2020 09:30    Labs:  Basic Metabolic Panel: BMP Latest Ref Rng & Units 09/09/2020 09/02/2020 08/26/2020  Glucose 70 - 99 mg/dL 338(S) 505(L) 976(B)  BUN 6 - 20 mg/dL 12 7 6   Creatinine 0.61 - 1.24 mg/dL 3.41 9.37  Sodium 135 - 145 mmol/L 139 140 137  Potassium 3.5 - 5.1 mmol/L 4.2 3.7 3.9  Chloride 98 - 111 mmol/L 103 103 101  CO2 22 - 32 mmol/L 29 29 26   Calcium 8.9 - 10.3 mg/dL 9.2 9.1 9.2    CBC: CBC Latest Ref Rng & Units 09/09/2020 09/02/2020 08/26/2020  WBC 4.0 - 10.5 K/uL 7.6 6.8 9.1  Hemoglobin 13.0 - 17.0 g/dL 11.6(L) 11.1(L) 11.8(L)  Hematocrit 39.0 - 52.0 % 38.0(L) 37.0(L) 38.8(L)  Platelets 150 - 400 K/uL 242 252 375    CBG: Recent Labs  Lab 09/11/20 0644 09/11/20 1154 09/11/20 1704 09/11/20 2101 09/12/20 0712  GLUCAP 157* 130* 101* 136* 138*    Brief HPI:   Carl Hill is a 50 y.o. male with history of T2DM, PAF, supermorbid obesity- BMI 78 who was admitted on 06/10/20 with septic shock due to necrotizing tissue infection of perianal, gluteal and perineal tissues.  He was taken to the OR for excisional debridement the same day by Dr. 52.  Postop course significant for issues with A. fib with RVR as well as V. tach arrest.  He did require repeat I&D with wound cultures growing out  group B Streptococcus actinomyces, E. coli and abundant Peptostreptococcus species.  Dr. Daiva Eves recommended IV Unasyn for 6 weeks followed by Augmentin for 6 months.  Hospital course was significant for issues with VDRF requiring tracheostomy, AKI, OIC with fecal impaction as well as need for ongoing wound care.  Therapy was ongoing and patient was noted to be debilitated having limitations in mobility as well as ADLs.  CIR  was recommended due to functional decline.   Hospital Course: CORTLAN DOLIN was admitted to rehab 08/06/2020 for inpatient therapies to consist of PT, ST and OT at least three hours five days a week. Past admission physiatrist, therapy team and rehab RN have worked together to provide customized collaborative inpatient rehab. Nutritional supplements were added to help promote wound healing and his intake has been good. He completed his course of IV antibiotics and was transitioned to Augmentin which is tolerating without side effects. Anemia of chronic illness is resolving and WBC is stable. His blood pressures were monitored on TID basis and has been stable.   Serial check of lytes showed that transient hypokalemia has resolved with brief supplementation and hyponitremia has resolved. Foley remains in placed due to sacral wounds and recent issues with urinary retention. Sacral wound is beefy red without eschar and has been healing well. [facility] was contacted about follow up and recommended wound clinic F/U with questions prn if needed after discharge.  His diabetes has been monitored with ac/hs CBG checks and SSI was use prn for tighter BS control. He continues on Levemir 40 units bid and novolog was decreased to 10 units tid ac to avoid hypoglycemic episodes. Back spasms have improved with use of robaxin qid.   He continued to have issues with constipation and bowel program has been adjsuted multiple times due to ongoing issues with obstipation and refusal of laxatives. Multiple KUBs showing extensive persistent stool burden. He was agreeable to SSE and has required disimpaction due to severe rectal pain/spasms. He has been educated on importance of adhering to current bowel program and uptitrating meds as needed. His sacral pain has improved and he was transitioned to MS contin from dilaudid and is to further wean off this in 10-12 days  He has made great progress during his stay but continues to be limited  by right knee instability. Supervision to CGA is recommended due to Supervision is recommended for safety. He will continue to receive follow up HHPT, HHOT,HHaide and HHRN by [facility] after discharge.    Rehab course: During patient's stay in rehab weekly team conferences were held to monitor patient's progress, set goals and discuss barriers to discharge. At admission, patient required total assist with ADL tasks and with mobility.  He has had improvement in activity tolerance, balance, postural control as well as ability to compensate for deficits.  He is able to complete ADL tasks with min assist and use of AE. He requires supervision for transfer and to ambulate 40' with RW and cues for safety. He requires CGA to climb 5 stairs. Family education was completed   Disposition: Home  Diet: Carb Modified.   Special Instructions: 1. Damp to dry dressing changes to sacrum bid and prn if soiled. 2. Monitor BS ac/hs and follow up with PCP for further adjustment.  3. Refills on narcotics per PCP and continue wean as advised.    Discharge Instructions    Ambulatory referral to Infectious Disease   Complete by: As directed    Follow up/Fournier's gangrene--antibiotic  regimen     Allergies as of 09/12/2020      Reactions   Bee Venom Anaphylaxis   Other reaction(s): Unknown Other reaction(s): Unknown Other reaction(s): Unknown   Sglt2 Inhibitors Other (See Comments)   Necrotizing infection of the perineum   Other Hives      Medication List    STOP taking these medications   Ensure Max Protein Liqd   feeding supplement Liqd   HYDROmorphone 1 MG/ML injection Commonly known as: DILAUDID   insulin aspart 100 UNIT/ML injection Commonly known as: novoLOG Replaced by: insulin aspart 100 UNIT/ML FlexPen   lactulose 10 GM/15ML solution Commonly known as: CHRONULAC   ondansetron 4 MG/2ML Soln injection Commonly known as: ZOFRAN   warfarin 2 MG tablet Commonly known as:  COUMADIN     TAKE these medications   acetaminophen 325 MG tablet Commonly known as: TYLENOL Take 1-2 tablets (325-650 mg total) by mouth every 4 (four) hours as needed for mild pain.   albuterol (2.5 MG/3ML) 0.083% nebulizer solution Commonly known as: PROVENTIL Take 3 mLs (2.5 mg total) by nebulization every 4 (four) hours as needed for wheezing or shortness of breath.   amiodarone 200 MG tablet Commonly known as: PACERONE Take 1 tablet (200 mg total) by mouth daily.   amoxicillin-clavulanate 875-125 MG tablet Commonly known as: AUGMENTIN Take 1 tablet by mouth every 8 (eight) hours. Notes to patient: Take at 7 am, 2 pm and 10 pm   atorvastatin 20 MG tablet Commonly known as: LIPITOR Take 1 tablet (20 mg total) by mouth every evening.   bisacodyl 10 MG suppository Commonly known as: DULCOLAX Place 1 suppository (10 mg total) rectally daily as needed for moderate constipation.   Comfort EZ Pen Needles 32G X 6 MM Misc Generic drug: Insulin Pen Needle 1 application by Does not apply route with breakfast, with lunch, and with evening meal.   diltiazem 240 MG 24 hr capsule Commonly known as: CARDIZEM CD Take 1 capsule (240 mg total) by mouth daily.   docusate sodium 100 MG capsule Commonly known as: COLACE Take 1 capsule (100 mg total) by mouth daily. What changed: when to take this   hydrocortisone 2.5 % rectal cream Commonly known as: ANUSOL-HC Place rectally 4 (four) times daily.   insulin aspart 100 UNIT/ML FlexPen Commonly known as: NOVOLOG Inject 10 Units into the skin 3 (three) times daily with meals. Replaces: insulin aspart 100 UNIT/ML injection   insulin detemir 100 UNIT/ML FlexPen Commonly known as: LEVEMIR Inject 40 Units into the skin 2 (two) times daily. What changed: how much to take   levothyroxine 75 MCG tablet Commonly known as: SYNTHROID Take 75 mcg by mouth daily.   melatonin 3 MG Tabs tablet Take 1 tablet (3 mg total) by mouth at bedtime  as needed.   methocarbamol 750 MG tablet Commonly known as: ROBAXIN Take 1 tablet (750 mg total) by mouth every 6 (six) hours as needed for muscle spasms.   morphine 15 MG 12 hr tablet-Rx#14 pills Commonly known as: MS CONTIN Take 1 tablet (15 mg total) by mouth every 12 (twelve) hours. Notes to patient: TAKE ONE PILL TWICE A DAY FOR 3 DAYS. THEN TAPER TO ONCE A DAY TILL GONE.    multivitamin with minerals Tabs tablet Take 1 tablet by mouth daily.   nortriptyline 50 MG capsule Commonly known as: PAMELOR Take 1 capsule (50 mg total) by mouth 2 (two) times daily.   Oxycodone HCl 10 MG Tabs--Rx#28 pills Take 1  tablet (10 mg total) by mouth every 6 (six) hours as needed for breakthrough pain. What changed:   when to take this  Another medication with the same name was removed. Continue taking this medication, and follow the directions you see here. Notes to patient: NEED TO LIMIT TO ONCE A DAY AS PER REHAB NEEDS:---can take 1/2 to one pill and that will give you at least 2+ weeks coverage.    pantoprazole 40 MG tablet Commonly known as: PROTONIX Take 1 tablet (40 mg total) by mouth daily.   polycarbophil 625 MG tablet Commonly known as: FIBERCON Take 1 tablet (625 mg total) by mouth daily.   polyethylene glycol 17 g packet Commonly known as: MIRALAX / GLYCOLAX Take 17 g by mouth daily.   pregabalin 300 MG capsule Commonly known as: LYRICA Take 1 capsule (300 mg total) by mouth 2 (two) times daily. What changed: when to take this   rivaroxaban 20 MG Tabs tablet Commonly known as: XARELTO Take 1 tablet (20 mg total) by mouth daily with supper.   saccharomyces boulardii 250 MG capsule Commonly known as: FLORASTOR Take 1 capsule (250 mg total) by mouth 2 (two) times daily.   senna-docusate 8.6-50 MG tablet Commonly known as: Senokot-S Take 1 tablet by mouth 2 (two) times daily.   white petrolatum Oint Commonly known as: VASELINE Apply 1 application topically daily.  To both feet at nights and cover with socks   witch hazel-glycerin pad Commonly known as: TUCKS Apply topically as needed for itching.       Follow-up Information    Lovorn, Aundra Millet, MD. Call.   Specialty: Physical Medicine and Rehabilitation Why: as needed Contact information: [address] [place] [address] [phone]        [facility] wound care Follow up on 09/25/2020.   Why: Be there at 10 am Contact information: [address], [place], [place] [address]. Phone # [phone] Fax # [phone]       Fritzi Mandes, MD. Call.   Specialty: General Surgery Why: for problems with wound/follow up as needed Contact information: [address] [place] [address] [phone]        [facility] Follow up.   Contact information: [address] [place] [address] [phone]       Noel Christmas, MD Follow up on 10/15/2020.   Specialty: Urology Why: Be there at 1:45 pm for 2 pm appointment. For foley change Contact information: [address] [place] [address] [phone]        Daiva Eves, Lisette Grinder, MD. Call.   Specialty: Infectious Diseases Why: for appointment/follow up on antibiotic regimen Contact information: [address] [place] [address] [phone]        Nathaneil Canary, PA-C Follow up on 09/25/2020.   Specialty: Physician Assistant Why: Appointment at 1:40 for post hospital follow up/medication refills Contact information: [address] [place] [address] [place] [address] [phone]               Signed: Jacquelynn Cree 09/13/2020, 6:14 PM

## 2020-09-12 NOTE — Progress Notes (Signed)
INPATIENT REHABILITATION DISCHARGE NOTE   Discharge instructions by: Marissa Nestle, PA  Verbalized understanding: yes.   Skin care/Wound care: Done early this morning  Pain: No complaints of pain.   IV's- None  Tubes/Drains: Foley catheter changed prior to DC.   Safety instructions: done  Patient belongings: Sent with patient  Discharged to: Home.   Discharged via: [facility]  Notes: Done.

## 2020-09-19 ENCOUNTER — Telehealth: Payer: Self-pay | Admitting: *Deleted

## 2020-09-19 NOTE — Telephone Encounter (Signed)
Theron Arista, PT, [facility] left a message asking for verbal orders for home health PT 1wk4. Medical record reviewed. Social work note reviewed.  Verbal orders given per office protocol.

## 2020-09-25 ENCOUNTER — Emergency Department (HOSPITAL_COMMUNITY)
Admission: EM | Admit: 2020-09-25 | Discharge: 2020-09-25 | Disposition: A | Discharge status: Home / Self Care | Payer: Medicaid Other | Attending: Emergency Medicine | Admitting: Emergency Medicine

## 2020-09-25 ENCOUNTER — Emergency Department (HOSPITAL_COMMUNITY): Payer: Medicaid Other

## 2020-09-25 ENCOUNTER — Other Ambulatory Visit: Payer: Self-pay

## 2020-09-25 DIAGNOSIS — S80212A Abrasion, left knee, initial encounter: Secondary | ICD-10-CM | POA: Diagnosis not present

## 2020-09-25 DIAGNOSIS — Z79899 Other long term (current) drug therapy: Secondary | ICD-10-CM | POA: Diagnosis not present

## 2020-09-25 DIAGNOSIS — I10 Essential (primary) hypertension: Secondary | ICD-10-CM | POA: Insufficient documentation

## 2020-09-25 DIAGNOSIS — Z794 Long term (current) use of insulin: Secondary | ICD-10-CM | POA: Diagnosis not present

## 2020-09-25 DIAGNOSIS — S8012XA Contusion of left lower leg, initial encounter: Secondary | ICD-10-CM | POA: Diagnosis not present

## 2020-09-25 DIAGNOSIS — E11621 Type 2 diabetes mellitus with foot ulcer: Secondary | ICD-10-CM | POA: Diagnosis not present

## 2020-09-25 DIAGNOSIS — E1140 Type 2 diabetes mellitus with diabetic neuropathy, unspecified: Secondary | ICD-10-CM | POA: Insufficient documentation

## 2020-09-25 DIAGNOSIS — E039 Hypothyroidism, unspecified: Secondary | ICD-10-CM | POA: Insufficient documentation

## 2020-09-25 DIAGNOSIS — F17200 Nicotine dependence, unspecified, uncomplicated: Secondary | ICD-10-CM | POA: Insufficient documentation

## 2020-09-25 DIAGNOSIS — L97409 Non-pressure chronic ulcer of unspecified heel and midfoot with unspecified severity: Secondary | ICD-10-CM | POA: Diagnosis not present

## 2020-09-25 DIAGNOSIS — M25561 Pain in right knee: Secondary | ICD-10-CM | POA: Diagnosis not present

## 2020-09-25 DIAGNOSIS — W1830XA Fall on same level, unspecified, initial encounter: Secondary | ICD-10-CM | POA: Diagnosis not present

## 2020-09-25 DIAGNOSIS — J45909 Unspecified asthma, uncomplicated: Secondary | ICD-10-CM | POA: Diagnosis not present

## 2020-09-25 DIAGNOSIS — S80912A Unspecified superficial injury of left knee, initial encounter: Secondary | ICD-10-CM | POA: Diagnosis present

## 2020-09-25 DIAGNOSIS — M25562 Pain in left knee: Secondary | ICD-10-CM

## 2020-09-25 MED ORDER — FENTANYL CITRATE (PF) 100 MCG/2ML IJ SOLN
50.0000 ug | Freq: Once | INTRAMUSCULAR | Status: AC
Start: 2020-09-25 — End: 2020-09-25
  Administered 2020-09-25: 50 ug via INTRAMUSCULAR
  Filled 2020-09-25: qty 2

## 2020-09-25 NOTE — ED Notes (Signed)
[facility] called  update pt is number 2 on the list

## 2020-09-25 NOTE — Discharge Instructions (Addendum)
Your xrays did not show any signs of fractures/dislocations today. Your pain is likely related to a soft tissue injury from your fall.   While at home please rest, ice, and elevate your knees to help reduce pain/swelling. Continue taking your at home pain medication as prescribed.   If no improvement in your symptoms you can follow up with [facility] for further evaluation.   Please also follow up with your PCP regarding your ED visit  Return to the ED for any worsening symptoms

## 2020-09-25 NOTE — ED Triage Notes (Signed)
PT BIB EMS due to a mechanical fall. Pt denies hitting his head. Pt has a wound on his buttocks that started a month ago and has a foley in to protect it from getting infected. Pt is axox4. Pt VSS. Pain 10/10

## 2020-09-25 NOTE — ED Notes (Signed)
[facility] called by Fergus Throne pt is number 11 on the list

## 2020-09-25 NOTE — ED Provider Notes (Signed)
[facility] Provider Note   CSN: [id number] Arrival date & time: 09/25/20  1343     History Chief Complaint  Patient presents with  . Knee Pain    Carl Hill is a 50 y.o. male with PMHx HTN, A fib on Xarelto, Diabetes, and morbid obesity who presents to the ED today via EMS with complaint of sudden onset, constant,sharp/stabbing, bilateral knee pain (L > R) s/p mechanical fall that occurred earlier this morning. Pt reports he ambulates with a walker. He wa going down steps outside today when he felt like his legs were giving out from underneath him causing him to fall and land on his bilateral knees. No head injury or LOC. Pt reports that since then he has been having knee pain. He does take chronic pain medication including 15 mg Morphine every 12 hours and 10 mg Oxycodone ever 6 hours PRN for pain. He took both of these this morning prior to falling. No other complaints at this time.   The history is provided by the patient and medical records.       Past Medical History:  Diagnosis Date  . 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 Asthma   . Atrial fibrillation (HCC)   . Diabetes mellitus without complication (HCC)   . History of cardioversion    3 times   . Hypertension   . Sepsis due to Streptococcus, group B (HCC) 06/22/2020    Patient Active Problem List   Diagnosis Date Noted  . H/O small bowel obstruction   . Muscle spasms of both lower extremities 08/15/2020  . Recurrent knee instability, 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  . Super-super obese (HCC)   . Urinary retention   . Diabetic peripheral neuropathy (HCC)   . Sacral pain   . Debility 08/06/2020  . Sepsis due to Streptococcus, group B (HCC) 06/22/2020  . 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 Acute respiratory failure (HCC)   . Sepsis (HCC) 06/10/2020  . Fournier gangrene 06/10/2020  . DKA (diabetic ketoacidosis) (HCC) 06/10/2020  . AKI (acute kidney injury) (HCC) 06/10/2020  . Diabetic ulcer of heel  (HCC) 06/10/2020  . Atrial fibrillation, chronic (HCC) 06/10/2020  . Essential hypertension 06/10/2020  . Dyslipidemia 06/10/2020  . Acquired hypothyroidism 06/10/2020  . Chronic pain disorder 06/10/2020  . Morbid obesity with BMI of 60.0-69.9, adult (HCC) 06/10/2020    Past Surgical History:  Procedure Laterality Date  . ABDOMINAL SURGERY    . INCISION AND DRAINAGE PERIRECTAL ABSCESS N/A 06/11/2020   Procedure: IRRIGATION AND DEBRIDEMENT PERINEAL WOUND;  Surgeon: Fritzi MandesAllen, Shelby L, MD;  [facility];  Service: General;  Laterality: N/A;  . IRRIGATION AND DEBRIDEMENT ABSCESS N/A 06/10/2020   Procedure: IRRIGATION AND DEBRIDEMENT ABSCESS;  Surgeon: Fritzi MandesAllen, Shelby L, MD;  Location: [facility] OR;  Service: General;  Laterality: N/A;  . LAPAROSCOPIC GASTRIC SLEEVE RESECTION         Family History  Problem Relation Age of Onset  . Diabetes Mother   . Healthy Brother   . Breast cancer Paternal Grandmother   . Pancreatic cancer Paternal Uncle     Social History   Tobacco Use  . Smoking status: Current Every Day Smoker  . Smokeless tobacco: Never Used  . Tobacco comment: 1 cigarette per day  Vaping Use  . Vaping Use: Never used  Substance Use Topics  . Alcohol use: Not Currently    Comment: Has not had a drink in 1 year  . Drug use: Never    Home Medications Prior  to Admission medications   Medication Sig Start Date End Date Taking? Authorizing Provider  acetaminophen (TYLENOL) 325 MG tablet Take 1-2 tablets (325-650 mg total) by mouth every 4 (four) hours as needed for mild pain. 09/06/20   Love, Evlyn Kanner, PA-C  albuterol (PROVENTIL) (2.5 MG/3ML) 0.083% nebulizer solution Take 3 mLs (2.5 mg total) by nebulization every 4 (four) hours as needed for wheezing or shortness of breath. 08/01/20   Russella Dar, NP  amiodarone (PACERONE) 200 MG tablet Take 1 tablet (200 mg total) by mouth daily. 09/12/20   Love, Evlyn Kanner, PA-C  amoxicillin-clavulanate (AUGMENTIN) 875-125 MG tablet Take 1  tablet by mouth every 8 (eight) hours. 09/12/20   Love, Evlyn Kanner, PA-C  atorvastatin (LIPITOR) 20 MG tablet Take 1 tablet (20 mg total) by mouth every evening. 09/12/20   Love, Evlyn Kanner, PA-C  bisacodyl (DULCOLAX) 10 MG suppository Place 1 suppository (10 mg total) rectally daily as needed for moderate constipation. 09/06/20   Love, Evlyn Kanner, PA-C  diltiazem (CARDIZEM CD) 240 MG 24 hr capsule Take 1 capsule (240 mg total) by mouth daily. 09/12/20   Love, Evlyn Kanner, PA-C  docusate sodium (COLACE) 100 MG capsule Take 1 capsule (100 mg total) by mouth daily. 09/12/20   Love, Evlyn Kanner, PA-C  hydrocortisone (ANUSOL-HC) 2.5 % rectal cream Place rectally 4 (four) times daily. 09/12/20   Love, Evlyn Kanner, PA-C  insulin aspart (NOVOLOG) 100 UNIT/ML FlexPen Inject 10 Units into the skin 3 (three) times daily with meals. 09/12/20   Love, Evlyn Kanner, PA-C  insulin detemir (LEVEMIR) 100 UNIT/ML FlexPen Inject 40 Units into the skin 2 (two) times daily. 09/12/20   Love, Evlyn Kanner, PA-C  Insulin Pen Needle (COMFORT EZ PEN NEEDLES) 32G X 6 MM MISC 1 application by Does not apply route with breakfast, with lunch, and with evening meal. 09/12/20   Love, Evlyn Kanner, PA-C  levothyroxine (SYNTHROID) 75 MCG tablet Take 75 mcg by mouth daily. 04/03/20   [provider]  melatonin 3 MG TABS tablet Take 1 tablet (3 mg total) by mouth at bedtime as needed. 08/01/20   Russella Dar, NP  methocarbamol (ROBAXIN) 750 MG tablet Take 1 tablet (750 mg total) by mouth every 6 (six) hours as needed for muscle spasms. 09/12/20   Love, Evlyn Kanner, PA-C  morphine (MS CONTIN) 15 MG 12 hr tablet Take 1 tablet (15 mg total) by mouth every 12 (twelve) hours. 09/12/20   Love, Evlyn Kanner, PA-C  Multiple Vitamin (MULTIVITAMIN WITH MINERALS) TABS tablet Take 1 tablet by mouth daily. 08/02/20   Russella Dar, NP  nortriptyline (PAMELOR) 50 MG capsule Take 1 capsule (50 mg total) by mouth 2 (two) times daily. 09/12/20   Love, Evlyn Kanner, PA-C  oxyCODONE 10 MG TABS  Take 1 tablet (10 mg total) by mouth every 6 (six) hours as needed for breakthrough pain. 09/12/20   Love, Evlyn Kanner, PA-C  pantoprazole (PROTONIX) 40 MG tablet Take 1 tablet (40 mg total) by mouth daily. 09/12/20   Love, Evlyn Kanner, PA-C  polycarbophil (FIBERCON) 625 MG tablet Take 1 tablet (625 mg total) by mouth daily. 09/12/20   Love, Evlyn Kanner, PA-C  polyethylene glycol (MIRALAX / GLYCOLAX) 17 g packet Take 17 g by mouth daily. 09/12/20   Love, Evlyn Kanner, PA-C  pregabalin (LYRICA) 300 MG capsule Take 1 capsule (300 mg total) by mouth 2 (two) times daily. 09/12/20   Love, Evlyn Kanner, PA-C  rivaroxaban (XARELTO) 20 MG TABS  tablet Take 1 tablet (20 mg total) by mouth daily with supper. 09/12/20   Love, Evlyn Kanner, PA-C  saccharomyces boulardii (FLORASTOR) 250 MG capsule Take 1 capsule (250 mg total) by mouth 2 (two) times daily. 08/05/20   Johnson, Clanford L, MD  senna-docusate (SENOKOT-S) 8.6-50 MG tablet Take 1 tablet by mouth 2 (two) times daily. 09/12/20   Love, Evlyn Kanner, PA-C  white petrolatum (VASELINE) OINT Apply 1 application topically daily. To both feet at nights and cover with socks 09/06/20   Love, Evlyn Kanner, PA-C  witch hazel-glycerin (TUCKS) pad Apply topically as needed for itching. 09/06/20   Love, Evlyn Kanner, PA-C    Allergies    Bee venom, Sglt2 inhibitors, and Other  Review of Systems   Review of Systems  Constitutional: Negative for chills and fever.  Musculoskeletal: Positive for arthralgias.  Neurological: Negative for syncope and headaches.  All other systems reviewed and are negative.   Physical Exam Updated Vital Signs Pulse (!) 101   Temp 98.8 F (37.1 C) (Oral)   Resp (!) 23   SpO2 95%   Physical Exam Vitals and nursing note reviewed.  Constitutional:      Appearance: He is obese. He is not ill-appearing or diaphoretic.  HENT:     Head: Normocephalic and atraumatic.  Eyes:     Conjunctiva/sclera: Conjunctivae normal.  Cardiovascular:     Rate and Rhythm: Normal rate and  regular rhythm.  Pulmonary:     Effort: Pulmonary effort is normal.     Breath sounds: Normal breath sounds. No wheezing, rhonchi or rales.  Abdominal:     Palpations: Abdomen is soft.     Tenderness: There is no abdominal tenderness. There is no guarding or rebound.  Musculoskeletal:     Cervical back: Neck supple.     Comments: Abrasion noted to anterior aspect of left knee with mild edema however exam limited s/2 body habitus. Diffuse TTP. ROM intact to knee without ligamentous instability appreciated. Hematoma noted to lateral aspect of proximal left lower leg as well; sensation decreased s/2 neuropathy. 1+ DP pulse.   Mild TTP to the R knee with intact ROM and strength. Sensation intact to knee; decreased distally s/2 neuropathy (at baseline). 1+ DP Pulse.   Skin:    General: Skin is warm and dry.  Neurological:     Mental Status: He is alert.     ED Results / Procedures / Treatments   Labs (all labs ordered are listed, but only abnormal results are displayed) Labs Reviewed - No data to display  EKG EKG Interpretation  Date/Time:  Wednesday September 25 2020 13:50:47 EDT Ventricular Rate:  96 PR Interval:  194 QRS Duration: 112 QT Interval:  412 QTC Calculation: 521 R Axis:   -10 Text Interpretation: Sinus rhythm Paired ventricular premature complexes Borderline intraventricular conduction delay Poor data quality Abnormal ECG Confirmed by Gerhard Munch [phone]) on 09/25/2020 1:55:28 PM   Radiology DG Tibia/Fibula Left  Result Date: 09/25/2020 CLINICAL DATA:  50 year old male with fall and left lower extremity pain. EXAM: LEFT KNEE - COMPLETE 4+ VIEW; LEFT TIBIA AND FIBULA - 2 VIEW COMPARISON:  None FINDINGS: There is no acute fracture or dislocation. There is arthritic changes of the left knee with tricompartmental narrowing and moderate narrowing of the medial compartment. The bones are mildly osteopenic. No joint effusion. Scattered calcification of the skin and superficial  soft tissues of the anterior shin, likely related to prior trauma. IMPRESSION: 1. No acute fracture or dislocation.  2. Arthritic changes of the left knee. Electronically Signed   By: Elgie Collard M.D.   On: 09/25/2020 15:27   DG Knee Complete 4 Views Left  Result Date: 09/25/2020 CLINICAL DATA:  50 year old male with fall and left lower extremity pain. EXAM: LEFT KNEE - COMPLETE 4+ VIEW; LEFT TIBIA AND FIBULA - 2 VIEW COMPARISON:  None FINDINGS: There is no acute fracture or dislocation. There is arthritic changes of the left knee with tricompartmental narrowing and moderate narrowing of the medial compartment. The bones are mildly osteopenic. No joint effusion. Scattered calcification of the skin and superficial soft tissues of the anterior shin, likely related to prior trauma. IMPRESSION: 1. No acute fracture or dislocation. 2. Arthritic changes of the left knee. Electronically Signed   By: Elgie Collard M.D.   On: 09/25/2020 15:27   DG Knee Complete 4 Views Right  Result Date: 09/25/2020 CLINICAL DATA:  Right knee pain after fall on stairs. EXAM: RIGHT KNEE - COMPLETE 4+ VIEW COMPARISON:  None. FINDINGS: No evidence of fracture, dislocation, or joint effusion. Medial compartment joint space narrowing. Mild tricompartmental peripheral spurring. No erosion or focal bone lesion. Soft tissues are unremarkable. IMPRESSION: 1. No fracture or subluxation of the right knee. 2. Mild tricompartmental osteoarthritis. Electronically Signed   By: Narda Rutherford M.D.   On: 09/25/2020 15:28    Procedures Procedures   Medications Ordered in ED Medications  fentaNYL (SUBLIMAZE) injection 50 mcg (50 mcg Intramuscular Given 09/25/20 1518)    ED Course  I have reviewed the triage vital signs and the nursing notes.  Pertinent labs & imaging results that were available during my care of the patient were reviewed by me and considered in my medical decision making (see chart for details).    MDM  Rules/Calculators/A&P                          50 year old male with a history of morbid obesity who presents to the ED today via EMS with complaint of bilateral knee pain, left greater than right status post mechanical fall that occurred earlier today.  On arrival to the ED vitals are stable.  Patient has abrasion to the left knee with mild swelling and diffuse tenderness palpation.  His range of motion is intact.  He has intact distal pulses.  His sensation is decreased bilaterally just distal to the knee secondary to neuropathy.  We will plan for x-rays of both knees as well as left tib-fib as patient has a hematoma with tenderness palpation along this area.  Pain medication provided at this time.  No head injury or loss of consciousness.  Do not feel patient requires additional images at this time.  Xrays negative at this time. Ace bandages applied to knees. RICE therapy discussed with pt. He is to follow up with orthopedics if no improvement in symptoms. He is in agreement with plan at this time. [facility] to take pt home. Stable for discharge.   This note was prepared using Dragon voice recognition software and may include unintentional dictation errors due to the inherent limitations of voice recognition software.  Final Clinical Impression(s) / ED Diagnoses Final diagnoses:  Acute pain of both knees    Rx / DC Orders ED Discharge Orders    None       Tanda Rockers, PA-C 09/25/20 1553    Gerhard Munch, MD 10/04/20 1615

## 2020-09-30 ENCOUNTER — Ambulatory Visit: Payer: Medicaid Other | Admitting: Internal Medicine

## 2020-09-30 NOTE — Progress Notes (Deleted)
[facility]  Reason for Consult: Fournier's gangrene  Referring Provider: Hospital follow-up   HPI:    Carl Hill is a 50 y.o. male with PMHx as below who presents to the clinic for follow-up of Fournier's gangrene.   Patient has a history of type 2 diabetes, atrial fibrillation, morbid obesity with a BMI of 78 who was admitted to the hospital June 10, 2020 with septic shock secondary to necrotizing tissue infection of the perianal, gluteal, and perennial tissues.  He was taken to the operating room for excisional debridement by Dr. Freida Busman with postop course significant for atrial fibrillation with RVR as well as ventricular tachycardia arrest.  He required repeat incision and drainage with wound cultures growing rare group B streptococcus, rare E. coli, rare Strep spp, few Actinomyces, and abundant Peptostreptococcus species.  He was seen in consultation by my partner, Dr. Daiva Eves, who recommended IV Unasyn for 6 weeks followed by oral Augmentin for 6 months.  He was discharged to inpatient rehab on August 06, 2020 where he remained until August 12, 2020.  He continues on Augmentin 1 tablet every 8 hours.  He has been tolerating this without any issues.  He is planned to be on Augmentin through 01/20/21.  Patient's Medications  New Prescriptions   No medications on file  Previous Medications   ACETAMINOPHEN (TYLENOL) 325 MG TABLET    Take 1-2 tablets (325-650 mg total) by mouth every 4 (four) hours as needed for mild pain.   ALBUTEROL (PROVENTIL) (2.5 MG/3ML) 0.083% NEBULIZER SOLUTION    Take 3 mLs (2.5 mg total) by nebulization every 4 (four) hours as needed for wheezing or shortness of breath.   AMIODARONE (PACERONE) 200 MG TABLET    Take 1 tablet (200 mg total) by mouth daily.   AMOXICILLIN-CLAVULANATE (AUGMENTIN) 875-125 MG TABLET    Take 1 tablet by mouth every 8 (eight) hours.   ATORVASTATIN (LIPITOR) 20 MG TABLET    Take 1 tablet (20 mg total) by  mouth every evening.   BISACODYL (DULCOLAX) 10 MG SUPPOSITORY    Place 1 suppository (10 mg total) rectally daily as needed for moderate constipation.   DILTIAZEM (CARDIZEM CD) 240 MG 24 HR CAPSULE    Take 1 capsule (240 mg total) by mouth daily.   DOCUSATE SODIUM (COLACE) 100 MG CAPSULE    Take 1 capsule (100 mg total) by mouth daily.   HYDROCORTISONE (ANUSOL-HC) 2.5 % RECTAL CREAM    Place rectally 4 (four) times daily.   INSULIN ASPART (NOVOLOG) 100 UNIT/ML FLEXPEN    Inject 10 Units into the skin 3 (three) times daily with meals.   INSULIN DETEMIR (LEVEMIR) 100 UNIT/ML FLEXPEN    Inject 40 Units into the skin 2 (two) times daily.   INSULIN PEN NEEDLE (COMFORT EZ PEN NEEDLES) 32G X 6 MM MISC    1 application by Does not apply route with breakfast, with lunch, and with evening meal.   LEVOTHYROXINE (SYNTHROID) 75 MCG TABLET    Take 75 mcg by mouth daily.   MELATONIN 3 MG TABS TABLET    Take 1 tablet (3 mg total) by mouth at bedtime as needed.   METHOCARBAMOL (ROBAXIN) 750 MG TABLET    Take 1 tablet (750 mg total) by mouth every 6 (six) hours as needed for muscle spasms.   MORPHINE (MS CONTIN) 15 MG 12 HR TABLET    Take 1 tablet (15 mg total) by mouth every 12 (twelve)  hours.   MULTIPLE VITAMIN (MULTIVITAMIN WITH MINERALS) TABS TABLET    Take 1 tablet by mouth daily.   NORTRIPTYLINE (PAMELOR) 50 MG CAPSULE    Take 1 capsule (50 mg total) by mouth 2 (two) times daily.   OXYCODONE 10 MG TABS    Take 1 tablet (10 mg total) by mouth every 6 (six) hours as needed for breakthrough pain.   PANTOPRAZOLE (PROTONIX) 40 MG TABLET    Take 1 tablet (40 mg total) by mouth daily.   POLYCARBOPHIL (FIBERCON) 625 MG TABLET    Take 1 tablet (625 mg total) by mouth daily.   POLYETHYLENE GLYCOL (MIRALAX / GLYCOLAX) 17 G PACKET    Take 17 g by mouth daily.   PREGABALIN (LYRICA) 300 MG CAPSULE    Take 1 capsule (300 mg total) by mouth 2 (two) times daily.   RIVAROXABAN (XARELTO) 20 MG TABS TABLET    Take 1 tablet (20  mg total) by mouth daily with supper.   SACCHAROMYCES BOULARDII (FLORASTOR) 250 MG CAPSULE    Take 1 capsule (250 mg total) by mouth 2 (two) times daily.   SENNA-DOCUSATE (SENOKOT-S) 8.6-50 MG TABLET    Take 1 tablet by mouth 2 (two) times daily.   WHITE PETROLATUM (VASELINE) OINT    Apply 1 application topically daily. To both feet at nights and cover with socks   WITCH HAZEL-GLYCERIN (TUCKS) PAD    Apply topically as needed for itching.  Modified Medications   No medications on file  Discontinued Medications   No medications on file      Past Medical History:  Diagnosis Date  . 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 Asthma   . Atrial fibrillation (HCC)   . Diabetes mellitus without complication (HCC)   . History of cardioversion    3 times   . Hypertension   . Sepsis due to Streptococcus, group B (HCC) 06/22/2020    Social History   Tobacco Use  . Smoking status: Current Every Day Smoker  . Smokeless tobacco: Never Used  . Tobacco comment: 1 cigarette per day  Vaping Use  . Vaping Use: Never used  Substance Use Topics  . Alcohol use: Not Currently    Comment: Has not had a drink in 1 year  . Drug use: Never    Family History  Problem Relation Age of Onset  . Diabetes Mother   . Healthy Brother   . Breast cancer Paternal Grandmother   . Pancreatic cancer Paternal Uncle     Allergies  Allergen Reactions  . Bee Venom Anaphylaxis    Other reaction(s): Unknown Other reaction(s): Unknown Other reaction(s): Unknown   . Sglt2 Inhibitors Other (See Comments)    Necrotizing infection of the perineum  . Other Hives    ROS    OBJECTIVE:    There were no vitals filed for this visit.   There is no height or weight on file to calculate BMI.  Physical Exam   Labs and Microbiology:  CBC Latest Ref Rng & Units 09/09/2020 09/02/2020 08/26/2020  WBC 4.0 - 10.5 K/uL 7.6 6.8 9.1  Hemoglobin 13.0 - 17.0 g/dL 11.6(L) 11.1(L) 11.8(L)  Hematocrit 39.0 - 52.0 % 38.0(L) 37.0(L) 38.8(L)   Platelets 150 - 400 K/uL 242 252 375   CMP Latest Ref Rng & Units 09/09/2020 09/02/2020 08/26/2020  Glucose 70 - 99 mg/dL 916(X) 450(T) 888(K)  BUN 6 - 20 mg/dL 12 7 6   Creatinine 0.61 - 1.24 mg/dL 8.00 3.49  Sodium 135 -  145 mmol/L 139 140 137  Potassium 3.5 - 5.1 mmol/L 4.2 3.7 3.9  Chloride 98 - 111 mmol/L 103 103 101  CO2 22 - 32 mmol/L 29 29 26   Calcium 8.9 - 10.3 mg/dL 9.2 9.1 9.2  Total Protein 6.5 - 8.1 g/dL - - -  Total Bilirubin 0.3 - 1.2 mg/dL - - -  Alkaline Phos 38 - 126 U/L - - -  AST 15 - 41 U/L - - -  ALT 0 - 44 U/L - - -     No results found for this or any previous visit (from the past 240 hour(s)).  Imaging: ***   ASSESSMENT & PLAN:    No problem-specific Assessment & Plan notes found for this encounter.   No orders of the defined types were placed in this encounter.     ***  for Infectious Disease [facility] 09/30/2020, 12:30 PM

## 2020-10-03 ENCOUNTER — Encounter (HOSPITAL_BASED_OUTPATIENT_CLINIC_OR_DEPARTMENT_OTHER): Payer: Medicaid Other | Admitting: Internal Medicine

## 2020-10-07 ENCOUNTER — Telehealth: Payer: Self-pay

## 2020-10-07 NOTE — Telephone Encounter (Signed)
Theron Arista, Physical Therapist for [facility] called for a verbal order for in-home PT. Twice a week for 4 weeks to work on mobility an endurance.   Call back ph [phone].

## 2020-10-08 NOTE — Telephone Encounter (Signed)
Please OK-  Thanks, ML

## 2020-10-08 NOTE — Telephone Encounter (Signed)
Verbal given 

## 2020-10-13 ENCOUNTER — Other Ambulatory Visit: Payer: Self-pay | Admitting: Physical Medicine and Rehabilitation

## 2020-10-17 ENCOUNTER — Other Ambulatory Visit: Payer: Self-pay

## 2020-10-17 ENCOUNTER — Ambulatory Visit (INDEPENDENT_AMBULATORY_CARE_PROVIDER_SITE_OTHER): Payer: Medicaid Other | Admitting: Internal Medicine

## 2020-10-17 ENCOUNTER — Encounter: Payer: Self-pay | Admitting: Internal Medicine

## 2020-10-17 VITALS — BP 140/79 | HR 89 | Temp 98.0°F | Ht 74.0 in

## 2020-10-17 DIAGNOSIS — M726 Necrotizing fasciitis: Secondary | ICD-10-CM | POA: Diagnosis present

## 2020-10-17 NOTE — Progress Notes (Signed)
[facility]  Reason for Consult:history of fournier's gangrene Referring Provider: Delle Reining, PA    Patient Active Problem List   Diagnosis Date Noted  . H/O small bowel obstruction   . Muscle spasms of both lower extremities 08/15/2020  . Recurrent knee instability, 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  . Super-super obese (HCC)   . Urinary retention   . Diabetic peripheral neuropathy (HCC)   . Sacral pain   . Debility 08/06/2020  . Sepsis due to Streptococcus, group B (HCC) 06/22/2020  . 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 Acute respiratory failure (HCC)   . Sepsis (HCC) 06/10/2020  . Fournier gangrene 06/10/2020  . DKA (diabetic ketoacidosis) (HCC) 06/10/2020  . AKI (acute kidney injury) (HCC) 06/10/2020  . Diabetic ulcer of heel (HCC) 06/10/2020  . Atrial fibrillation, chronic (HCC) 06/10/2020  . Essential hypertension 06/10/2020  . Dyslipidemia 06/10/2020  . Acquired hypothyroidism 06/10/2020  . Chronic pain disorder 06/10/2020  . Morbid obesity with BMI of 60.0-69.9, adult (HCC) 06/10/2020      HPI: Carl Hill is a 50 y.o. male morbidly obese (bmi 78), dm2, afib on xarelto, htn/hlp, recent admission 06/10/20 for septic shock in setting fournier's gangrene s/p I&D, course complicated by prolonged respiratory failure requiring trach (been decanulated) and vtach arrest, here for ID clinic follow up  He had a prolonged hospital course, transitioned to inpatient rehab on 3/07 and discharged on 09/12/2020  He was seen by id service during admission. Had OR I&D with wound cx growing ecoli, gbs, peptostreptococcus  He was transitioned from appropriate iv abx to amox-clav starting 1/24. Due to presence of actino in the wound, id tentatively recommend at least 6 months of oral abx  He had a repeat abd pelv ct on 08/02/2020 that showed no intraabdominal focus of infection  He is doing well, and took last pill of amox-clav that was given today 10/17/2020  He  has home health nurse qweek to take care of the wound. I reviewed his picture. It looks really good  He has no diarrhea, n/v, rash, fever, chill Patient is eating well  Patient is not yet able to walk on his own, but with his walker. It is improving. Doing PT twice a week  He will see outpatient wound care June 1st   I reviewed previous chart/labs   Review of Systems: ROS All other ros negative      Past Medical History:  Diagnosis Date  . 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 Asthma   . Atrial fibrillation (HCC)   . Diabetes mellitus without complication (HCC)   . History of cardioversion    3 times   . Hypertension   . Sepsis due to Streptococcus, group B (HCC) 06/22/2020    Social History   Tobacco Use  . Smoking status: Current Every Day Smoker  . Smokeless tobacco: Never Used  . Tobacco comment: 1 cigarette per day  Vaping Use  . Vaping Use: Never used  Substance Use Topics  . Alcohol use: Not Currently    Comment: Has not had a drink in 1 year  . Drug use: Never    Family History  Problem Relation Age of Onset  . Diabetes Mother   . Healthy Brother   . Breast cancer Paternal Grandmother   . Pancreatic cancer Paternal Uncle     Allergies  Allergen Reactions  . Bee Venom Anaphylaxis    Other reaction(s): Unknown Other reaction(s): Unknown Other reaction(s): Unknown   .  Sglt2 Inhibitors Other (See Comments)    Necrotizing infection of the perineum  . Other Hives    OBJECTIVE: There were no vitals filed for this visit. There is no height or weight on file to calculate BMI.   Physical Exam General/constitutional: no distress, pleasant. On stretcher. Very obese HEENT: Normocephalic, PER, Conj Clear, EOMI, Oropharynx clear Neck supple CV: rrr no mrg Lungs: clear to auscultation, normal respiratory effort Abd: Soft, Nontender Ext: trace edema bilateral le with hyperpigmentation Skin: I reviewed his pictures (one from beginning of admission right after  I&D 06/10/20, and the second one a few days prior to this visit) Neuro: nonfocal MSK: no peripheral joint swelling/tenderness/warmth; back spines nontender         Lab: Lab Results  Component Value Date   WBC 7.6 09/09/2020   HGB 11.6 (L) 09/09/2020   HCT 38.0 (L) 09/09/2020   MCV 84.4 09/09/2020   PLT 242 09/09/2020   Last metabolic panel Lab Results  Component Value Date   GLUCOSE 152 (H) 09/09/2020   NA 139 09/09/2020   K 4.2 09/09/2020   CL 103 09/09/2020   CO2 29 09/09/2020   BUN 12 09/09/2020   CREATININE 0.96 09/09/2020   GFRNONAA >60 09/09/2020   CALCIUM 9.2 09/09/2020   PHOS 4.6 06/24/2020   PROT 5.9 (L) 08/07/2020   ALBUMIN 1.7 (L) 08/07/2020   BILITOT 0.6 08/07/2020   ALKPHOS 80 08/07/2020   AST 20 08/07/2020   ALT 16 08/07/2020   ANIONGAP 7 09/09/2020     Microbiology: 1/10 wound cx ecoli (pan sensitive), vgs (I levo, S pcn), strep constellatus, few actinomyces species 1/10 bcx negative  Serology: 1/10 hiv negative  Imaging: 3/04 abd pelv ct 1. No acute abdominopelvic findings. Specifically, no evidence of bowel perforation. 2. Moderate-large volume of stool throughout the colon. 3. Trace left pleural effusion with left basilar atelectasis. 4. Tiny nonobstructing right renal stone.  Assessment/plan: Problem List Items Addressed This Visit   None   Visit Diagnoses    Necrotizing fasciitis (HCC)    -  Primary     S/p I&D 1/10 and 06/11/20  Patient's infection is well treated. No bsi at onset. Wound is healed with moderate opening but good granulating tissue  He had had 4 months of antibiotics which is more than enough. Originally deemed needed due to actinomyces in the wound. However, no clinical syndrome and 3/04 abd ct without abscess otherwise  I do not think he needs any more antibiotics  -continue going to wound care and doing physical therapy -I stress his physical therapy is key to prevent wound from getting worse -no further  abx -discharge from id clinic; f/u as needed if open wound become reinfected     I have spent a total of 20 minutes of face-to-face and non-face-to-face time, excluding clinical staff time, preparing to see patient, ordering tests and/or medications, and provide counseling the patient    Follow-up: Return if symptoms worsen or fail to improve.  Raymondo Band, MD [facility] [phone] pager   [phone] cell 10/17/2020, 3:19 PM

## 2020-10-17 NOTE — Patient Instructions (Signed)
You are doing very well  Continue aggressive physical therapy, which is the key to your recovery and prevent further sacral wound infection  Continue good diabetic control  Follow wound care as referred   No need to see [place], your infection is cured.

## 2020-11-05 ENCOUNTER — Emergency Department (HOSPITAL_COMMUNITY): Payer: Medicaid Other

## 2020-11-05 ENCOUNTER — Other Ambulatory Visit: Payer: Self-pay

## 2020-11-05 ENCOUNTER — Emergency Department (HOSPITAL_COMMUNITY)
Admission: EM | Admit: 2020-11-05 | Discharge: 2020-11-05 | Disposition: A | Discharge status: Home / Self Care | Payer: Medicaid Other | Attending: Emergency Medicine | Admitting: Emergency Medicine

## 2020-11-05 ENCOUNTER — Encounter (HOSPITAL_COMMUNITY): Payer: Self-pay | Admitting: *Deleted

## 2020-11-05 DIAGNOSIS — S0990XA Unspecified injury of head, initial encounter: Secondary | ICD-10-CM | POA: Diagnosis present

## 2020-11-05 DIAGNOSIS — E11621 Type 2 diabetes mellitus with foot ulcer: Secondary | ICD-10-CM | POA: Insufficient documentation

## 2020-11-05 DIAGNOSIS — E1110 Type 2 diabetes mellitus with ketoacidosis without coma: Secondary | ICD-10-CM | POA: Diagnosis not present

## 2020-11-05 DIAGNOSIS — E039 Hypothyroidism, unspecified: Secondary | ICD-10-CM | POA: Diagnosis not present

## 2020-11-05 DIAGNOSIS — L97509 Non-pressure chronic ulcer of other part of unspecified foot with unspecified severity: Secondary | ICD-10-CM | POA: Diagnosis not present

## 2020-11-05 DIAGNOSIS — I4891 Unspecified atrial fibrillation: Secondary | ICD-10-CM | POA: Insufficient documentation

## 2020-11-05 DIAGNOSIS — Z7901 Long term (current) use of anticoagulants: Secondary | ICD-10-CM

## 2020-11-05 DIAGNOSIS — I10 Essential (primary) hypertension: Secondary | ICD-10-CM | POA: Insufficient documentation

## 2020-11-05 DIAGNOSIS — Z79899 Other long term (current) drug therapy: Secondary | ICD-10-CM | POA: Diagnosis not present

## 2020-11-05 DIAGNOSIS — J45909 Unspecified asthma, uncomplicated: Secondary | ICD-10-CM | POA: Insufficient documentation

## 2020-11-05 DIAGNOSIS — F17200 Nicotine dependence, unspecified, uncomplicated: Secondary | ICD-10-CM | POA: Insufficient documentation

## 2020-11-05 DIAGNOSIS — S0003XS Contusion of scalp, sequela: Secondary | ICD-10-CM

## 2020-11-05 DIAGNOSIS — W01198A Fall on same level from slipping, tripping and stumbling with subsequent striking against other object, initial encounter: Secondary | ICD-10-CM | POA: Diagnosis not present

## 2020-11-05 DIAGNOSIS — E1140 Type 2 diabetes mellitus with diabetic neuropathy, unspecified: Secondary | ICD-10-CM | POA: Diagnosis not present

## 2020-11-05 DIAGNOSIS — S0003XA Contusion of scalp, initial encounter: Secondary | ICD-10-CM | POA: Insufficient documentation

## 2020-11-05 DIAGNOSIS — Z794 Long term (current) use of insulin: Secondary | ICD-10-CM | POA: Insufficient documentation

## 2020-11-05 DIAGNOSIS — W19XXXA Unspecified fall, initial encounter: Secondary | ICD-10-CM

## 2020-11-05 LAB — CBG MONITORING, ED: Glucose-Capillary: 84 mg/dL (ref 70–99)

## 2020-11-05 NOTE — Discharge Instructions (Addendum)
1.  Very large hematomas can take a long time to resolve.  Wait at least several more months to see what the final appearance will be of your scalp hematoma. 2.  You can schedule a recheck with your family doctor to keep monitoring it and make referral to a plastic surgeon if after several months it still is something very cosmetically concerning.  Keep in mind, you would have to stop your Eliquis which would increase risk of blood clots.  It is possible that even with specialist evaluation they would recommend no specific interventions.

## 2020-11-05 NOTE — ED Triage Notes (Signed)
Pt came from cardiologist office to have CT of head since on Xarelto. He passed out and hit head. Pt has lump on head that is result for falling one month ago. Pupils 3RRB. Pt has history of diabetes, afib.  CBG 113

## 2020-11-05 NOTE — ED Provider Notes (Signed)
[facility] EMERGENCY DEPARTMENT Provider Note   CSN: [id number] Arrival date & time: 11/05/20  1103     History Chief Complaint  Patient presents with  . Fall    On Xarelto: Here for CT of Head sent from MD office    Carl Hill is a 50 y.o. male.  HPI Xarelto for atrial fibrillation.  He reports that yesterday after a large workout session with his physical therapist, he had gotten slightly lightheaded after resting and then going to standing position.  He did fall backwards and landed on his buttocks.  He denies he has significant head trauma.  After a few minutes he was back to normal.  Patient does have a large resolving hematoma on his scalp from the fall at the beginning of May.  This has been stable.  He was seen by cardiology this morning as a follow-up appointment for A. fib.  After this evaluation, he was instructed to come to the emergency department for a CT scan of his head to make sure he did not have any intracranial bleeding associated with his recent fall and persisting hematoma.  Patient denies he is having any problems with confusion.  He is not having any incoordination.  He is not having headaches nausea or vomiting.    Past Medical History:  Diagnosis Date  . 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 Asthma   . Atrial fibrillation (HCC)   . Diabetes mellitus without complication (HCC)   . History of cardioversion    3 times   . Hypertension   . Sepsis due to Streptococcus, group B (HCC) 06/22/2020    Patient Active Problem List   Diagnosis Date Noted  . H/O small bowel obstruction   . Muscle spasms of both lower extremities 08/15/2020  . Recurrent knee instability, 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  . Super-super obese (HCC)   . Urinary retention   . Diabetic peripheral neuropathy (HCC)   . Sacral pain   . Debility 08/06/2020  . Sepsis due to Streptococcus, group B (HCC) 06/22/2020  . 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 Acute respiratory failure (HCC)   . Sepsis  (HCC) 06/10/2020  . Fournier gangrene 06/10/2020  . DKA (diabetic ketoacidosis) (HCC) 06/10/2020  . AKI (acute kidney injury) (HCC) 06/10/2020  . Diabetic ulcer of heel (HCC) 06/10/2020  . Atrial fibrillation, chronic (HCC) 06/10/2020  . Essential hypertension 06/10/2020  . Dyslipidemia 06/10/2020  . Acquired hypothyroidism 06/10/2020  . Chronic pain disorder 06/10/2020  . Morbid obesity with BMI of 60.0-69.9, adult (HCC) 06/10/2020    Past Surgical History:  Procedure Laterality Date  . ABDOMINAL SURGERY    . INCISION AND DRAINAGE PERIRECTAL ABSCESS N/A 06/11/2020   Procedure: IRRIGATION AND DEBRIDEMENT PERINEAL WOUND;  Surgeon: Fritzi Mandes, MD;  Location: [facility] OR;  Service: General;  Laterality: N/A;  . IRRIGATION AND DEBRIDEMENT ABSCESS N/A 06/10/2020   Procedure: IRRIGATION AND DEBRIDEMENT ABSCESS;  Surgeon: Fritzi Mandes, MD;  Location: [facility] OR;  Service: General;  Laterality: N/A;  . LAPAROSCOPIC GASTRIC SLEEVE RESECTION         Family History  Problem Relation Age of Onset  . Diabetes Mother   . Healthy Brother   . Breast cancer Paternal Grandmother   . Pancreatic cancer Paternal Uncle     Social History   Tobacco Use  . Smoking status: Current Every Day Smoker  . Smokeless tobacco: Never Used  . Tobacco comment: 1 cigarette per day  Vaping Use  . Vaping Use: Never used  Substance Use Topics  . Alcohol use: Not Currently    Comment: Has not had a drink in 1 year  . Drug use: Never    Home Medications Prior to Admission medications   Medication Sig Start Date End Date Taking? Authorizing Provider  acetaminophen (TYLENOL) 325 MG tablet Take 1-2 tablets (325-650 mg total) by mouth every 4 (four) hours as needed for mild pain. 09/06/20   Love, Evlyn Kanner, PA-C  albuterol (PROVENTIL) (2.5 MG/3ML) 0.083% nebulizer solution Take 3 mLs (2.5 mg total) by nebulization every 4 (four) hours as needed for wheezing or shortness of breath. 08/01/20   Russella Dar, NP   amiodarone (PACERONE) 200 MG tablet Take 1 tablet (200 mg total) by mouth daily. 09/12/20   Love, Evlyn Kanner, PA-C  amoxicillin-clavulanate (AUGMENTIN) 875-125 MG tablet Take 1 tablet by mouth every 8 (eight) hours. Patient not taking: Reported on 10/17/2020 09/12/20   Love, Evlyn Kanner, PA-C  atorvastatin (LIPITOR) 20 MG tablet Take 1 tablet (20 mg total) by mouth every evening. 09/12/20   Love, Evlyn Kanner, PA-C  bisacodyl (DULCOLAX) 10 MG suppository Place 1 suppository (10 mg total) rectally daily as needed for moderate constipation. 09/06/20   Love, Evlyn Kanner, PA-C  diltiazem (CARDIZEM CD) 240 MG 24 hr capsule Take 1 capsule (240 mg total) by mouth daily. 09/12/20   Love, Evlyn Kanner, PA-C  docusate sodium (COLACE) 100 MG capsule Take 1 capsule (100 mg total) by mouth daily. 09/12/20   Love, Evlyn Kanner, PA-C  hydrocortisone (ANUSOL-HC) 2.5 % rectal cream Place rectally 4 (four) times daily. 09/12/20   Love, Evlyn Kanner, PA-C  insulin aspart (NOVOLOG) 100 UNIT/ML FlexPen Inject 10 Units into the skin 3 (three) times daily with meals. 09/12/20   Love, Evlyn Kanner, PA-C  insulin detemir (LEVEMIR) 100 UNIT/ML FlexPen Inject 40 Units into the skin 2 (two) times daily. 09/12/20   Love, Evlyn Kanner, PA-C  Insulin Pen Needle (COMFORT EZ PEN NEEDLES) 32G X 6 MM MISC 1 application by Does not apply route with breakfast, with lunch, and with evening meal. 09/12/20   Love, Evlyn Kanner, PA-C  levothyroxine (SYNTHROID) 75 MCG tablet Take 75 mcg by mouth daily. 04/03/20   [provider]  melatonin 3 MG TABS tablet Take 1 tablet (3 mg total) by mouth at bedtime as needed. 08/01/20   Russella Dar, NP  methocarbamol (ROBAXIN) 750 MG tablet Take 1 tablet (750 mg total) by mouth every 6 (six) hours as needed for muscle spasms. 09/12/20   Love, Evlyn Kanner, PA-C  morphine (MS CONTIN) 15 MG 12 hr tablet Take 1 tablet (15 mg total) by mouth every 12 (twelve) hours. 09/12/20   Love, Evlyn Kanner, PA-C  Multiple Vitamin (MULTIVITAMIN WITH MINERALS) TABS  tablet Take 1 tablet by mouth daily. 08/02/20   Russella Dar, NP  nortriptyline (PAMELOR) 50 MG capsule Take 1 capsule (50 mg total) by mouth 2 (two) times daily. 09/12/20   Love, Evlyn Kanner, PA-C  oxyCODONE 10 MG TABS Take 1 tablet (10 mg total) by mouth every 6 (six) hours as needed for breakthrough pain. 09/12/20   Love, Evlyn Kanner, PA-C  pantoprazole (PROTONIX) 40 MG tablet Take 1 tablet (40 mg total) by mouth daily. 09/12/20   Love, Evlyn Kanner, PA-C  polycarbophil (FIBERCON) 625 MG tablet Take 1 tablet (625 mg total) by mouth daily. 09/12/20   Love, Evlyn Kanner, PA-C  polyethylene glycol (MIRALAX / GLYCOLAX) 17 g packet Take 17 g by mouth daily.  09/12/20   Love, Evlyn Kanner, PA-C  pregabalin (LYRICA) 300 MG capsule Take 1 capsule (300 mg total) by mouth 2 (two) times daily. 09/12/20   Love, Evlyn Kanner, PA-C  rivaroxaban (XARELTO) 20 MG TABS tablet Take 1 tablet (20 mg total) by mouth daily with supper. 09/12/20   Love, Evlyn Kanner, PA-C  saccharomyces boulardii (FLORASTOR) 250 MG capsule Take 1 capsule (250 mg total) by mouth 2 (two) times daily. 08/05/20   Johnson, Clanford L, MD  senna-docusate (SENOKOT-S) 8.6-50 MG tablet Take 1 tablet by mouth 2 (two) times daily. 09/12/20   Love, Evlyn Kanner, PA-C  white petrolatum (VASELINE) OINT Apply 1 application topically daily. To both feet at nights and cover with socks 09/06/20   Love, Evlyn Kanner, PA-C  witch hazel-glycerin (TUCKS) pad Apply topically as needed for itching. 09/06/20   Love, Evlyn Kanner, PA-C    Allergies    Bee venom, Sglt2 inhibitors, and Other  Review of Systems   Review of Systems 10 systems reviewed and negative except as per HPI Physical Exam Updated Vital Signs BP 125/81 (BP Location: Right Arm)   Pulse 86   Temp 98.1 F (36.7 C) (Oral)   Resp 18   SpO2 95%   Physical Exam Constitutional:      Appearance: He is well-developed. He is obese.  HENT:     Head:     Comments: 6 cm raised resolving hematoma to the top of the head.  No associated  discoloration or erythema.  All skin surfaces are closed.    Nose: Nose normal.     Mouth/Throat:     Mouth: Mucous membranes are moist.     Pharynx: Oropharynx is clear.  Eyes:     Pupils: Pupils are equal, round, and reactive to light.  Cardiovascular:     Rate and Rhythm: Normal rate and regular rhythm.     Heart sounds: Normal heart sounds.  Pulmonary:     Effort: Pulmonary effort is normal.     Breath sounds: Normal breath sounds.  Abdominal:     General: Bowel sounds are normal. There is no distension.     Palpations: Abdomen is soft.     Tenderness: There is no abdominal tenderness.  Musculoskeletal:        General: Normal range of motion.     Cervical back: Neck supple.  Skin:    General: Skin is warm and dry.  Neurological:     General: No focal deficit present.     Mental Status: He is alert and oriented to person, place, and time.     GCS: GCS eye subscore is 4. GCS verbal subscore is 5. GCS motor subscore is 6.     Motor: No weakness.     Coordination: Coordination normal.  Psychiatric:        Mood and Affect: Mood normal.     ED Results / Procedures / Treatments   Labs (all labs ordered are listed, but only abnormal results are displayed) Labs Reviewed - No data to display  EKG None  Radiology No results found.  Procedures Procedures   Medications Ordered in ED Medications - No data to display  ED Course  I have reviewed the triage vital signs and the nursing notes.  Pertinent labs & imaging results that were available during my care of the patient were reviewed by me and considered in my medical decision making (see chart for details).    MDM Rules/Calculators/A&P  CT head is negative for any acute findings.  Patient has concerns for a persisting hematoma.  This is in the resolution phase but does remain approximately 6 cm.  There is no skin changes of erythema or discoloration.  This appears to be healing but patient is  counseled that some hematomas do leave residual calcification and enlargement.  Patient is advised to wait at least several more months for final appearance of the hematoma.  At that time follow-up with PCP to determine if referral to plastic surgery would be indicated.  Patient is however counseled that he would have to stop Eliquis and the risk benefit of trying to cosmetically manage a scalp hematoma may not be it to his advantage.  Final Clinical Impression(s) / ED Diagnoses Final diagnoses:  Hematoma of scalp, sequela  Fall, initial encounter  Anticoagulated    Rx / DC Orders ED Discharge Orders    None       Arby BarrettePfeiffer, Harlis Champoux, MD 11/05/20 1446

## 2020-12-10 ENCOUNTER — Inpatient Hospital Stay (HOSPITAL_COMMUNITY)
Admission: EM | Admit: 2020-12-10 | Discharge: 2020-12-16 | DRG: 312 | Disposition: A | Discharge status: Home Health | Payer: Medicaid Other | Attending: Internal Medicine | Admitting: Internal Medicine

## 2020-12-10 ENCOUNTER — Emergency Department (HOSPITAL_COMMUNITY): Payer: Medicaid Other

## 2020-12-10 DIAGNOSIS — I959 Hypotension, unspecified: Secondary | ICD-10-CM | POA: Diagnosis not present

## 2020-12-10 DIAGNOSIS — R6520 Severe sepsis without septic shock: Secondary | ICD-10-CM

## 2020-12-10 DIAGNOSIS — N179 Acute kidney failure, unspecified: Secondary | ICD-10-CM | POA: Diagnosis present

## 2020-12-10 DIAGNOSIS — E2740 Unspecified adrenocortical insufficiency: Secondary | ICD-10-CM | POA: Diagnosis present

## 2020-12-10 DIAGNOSIS — Z28310 Unvaccinated for covid-19: Secondary | ICD-10-CM

## 2020-12-10 DIAGNOSIS — Z8619 Personal history of other infectious and parasitic diseases: Secondary | ICD-10-CM | POA: Diagnosis not present

## 2020-12-10 DIAGNOSIS — A419 Sepsis, unspecified organism: Secondary | ICD-10-CM | POA: Diagnosis not present

## 2020-12-10 DIAGNOSIS — Z833 Family history of diabetes mellitus: Secondary | ICD-10-CM | POA: Diagnosis not present

## 2020-12-10 DIAGNOSIS — J45909 Unspecified asthma, uncomplicated: Secondary | ICD-10-CM | POA: Diagnosis present

## 2020-12-10 DIAGNOSIS — Z20822 Contact with and (suspected) exposure to covid-19: Secondary | ICD-10-CM | POA: Diagnosis present

## 2020-12-10 DIAGNOSIS — G4733 Obstructive sleep apnea (adult) (pediatric): Secondary | ICD-10-CM | POA: Diagnosis present

## 2020-12-10 DIAGNOSIS — Z79890 Hormone replacement therapy (postmenopausal): Secondary | ICD-10-CM

## 2020-12-10 DIAGNOSIS — E119 Type 2 diabetes mellitus without complications: Secondary | ICD-10-CM | POA: Diagnosis present

## 2020-12-10 DIAGNOSIS — Z91030 Bee allergy status: Secondary | ICD-10-CM | POA: Diagnosis not present

## 2020-12-10 DIAGNOSIS — Z888 Allergy status to other drugs, medicaments and biological substances status: Secondary | ICD-10-CM | POA: Diagnosis not present

## 2020-12-10 DIAGNOSIS — Z6841 Body Mass Index (BMI) 40.0 and over, adult: Secondary | ICD-10-CM

## 2020-12-10 DIAGNOSIS — R6510 Systemic inflammatory response syndrome (SIRS) of non-infectious origin without acute organ dysfunction: Secondary | ICD-10-CM | POA: Diagnosis present

## 2020-12-10 DIAGNOSIS — E872 Acidosis: Secondary | ICD-10-CM | POA: Diagnosis present

## 2020-12-10 DIAGNOSIS — I10 Essential (primary) hypertension: Secondary | ICD-10-CM | POA: Diagnosis present

## 2020-12-10 DIAGNOSIS — F17200 Nicotine dependence, unspecified, uncomplicated: Secondary | ICD-10-CM | POA: Diagnosis present

## 2020-12-10 DIAGNOSIS — I951 Orthostatic hypotension: Principal | ICD-10-CM | POA: Diagnosis present

## 2020-12-10 DIAGNOSIS — I482 Chronic atrial fibrillation, unspecified: Secondary | ICD-10-CM | POA: Diagnosis present

## 2020-12-10 DIAGNOSIS — G8929 Other chronic pain: Secondary | ICD-10-CM | POA: Diagnosis present

## 2020-12-10 DIAGNOSIS — J9811 Atelectasis: Secondary | ICD-10-CM | POA: Diagnosis present

## 2020-12-10 DIAGNOSIS — Z794 Long term (current) use of insulin: Secondary | ICD-10-CM

## 2020-12-10 DIAGNOSIS — K5900 Constipation, unspecified: Secondary | ICD-10-CM | POA: Diagnosis present

## 2020-12-10 DIAGNOSIS — G894 Chronic pain syndrome: Secondary | ICD-10-CM | POA: Diagnosis present

## 2020-12-10 DIAGNOSIS — Z7902 Long term (current) use of antithrombotics/antiplatelets: Secondary | ICD-10-CM | POA: Diagnosis not present

## 2020-12-10 DIAGNOSIS — Z79899 Other long term (current) drug therapy: Secondary | ICD-10-CM

## 2020-12-10 DIAGNOSIS — R55 Syncope and collapse: Secondary | ICD-10-CM | POA: Diagnosis present

## 2020-12-10 LAB — COMPREHENSIVE METABOLIC PANEL
ALT: 18 U/L (ref 0–44)
AST: 23 U/L (ref 15–41)
Albumin: 3 g/dL — ABNORMAL LOW (ref 3.5–5.0)
Alkaline Phosphatase: 68 U/L (ref 38–126)
Anion gap: 11 (ref 5–15)
BUN: 20 mg/dL (ref 6–20)
CO2: 21 mmol/L — ABNORMAL LOW (ref 22–32)
Calcium: 8.8 mg/dL — ABNORMAL LOW (ref 8.9–10.3)
Chloride: 103 mmol/L (ref 98–111)
Creatinine, Ser: 1.53 mg/dL — ABNORMAL HIGH (ref 0.61–1.24)
GFR, Estimated: 55 mL/min — ABNORMAL LOW (ref >60)
Glucose, Bld: 144 mg/dL — ABNORMAL HIGH (ref 70–99)
Potassium: 4.5 mmol/L (ref 3.5–5.1)
Sodium: 135 mmol/L (ref 135–145)
Total Bilirubin: 0.7 mg/dL (ref 0.3–1.2)
Total Protein: 7 g/dL (ref 6.5–8.1)

## 2020-12-10 LAB — CBC WITH DIFFERENTIAL/PLATELET
Abs Immature Granulocytes: 0.04 10*3/uL (ref 0.00–0.07)
Basophils Absolute: 0.1 10*3/uL (ref 0.0–0.1)
Basophils Relative: 1 %
Eosinophils Absolute: 0.1 10*3/uL (ref 0.0–0.5)
Eosinophils Relative: 1 %
HCT: 39.7 % (ref 39.0–52.0)
Hemoglobin: 12.2 g/dL — ABNORMAL LOW (ref 13.0–17.0)
Immature Granulocytes: 0 %
Lymphocytes Relative: 26 %
Lymphs Abs: 2.5 10*3/uL (ref 0.7–4.0)
MCH: 24.9 pg — ABNORMAL LOW (ref 26.0–34.0)
MCHC: 30.7 g/dL (ref 30.0–36.0)
MCV: 81 fL (ref 80.0–100.0)
Monocytes Absolute: 0.6 10*3/uL (ref 0.1–1.0)
Monocytes Relative: 6 %
Neutro Abs: 6.5 10*3/uL (ref 1.7–7.7)
Neutrophils Relative %: 66 %
Platelets: 231 10*3/uL (ref 150–400)
RBC: 4.9 MIL/uL (ref 4.22–5.81)
RDW: 18.1 % — ABNORMAL HIGH (ref 11.5–15.5)
WBC: 9.8 10*3/uL (ref 4.0–10.5)
nRBC: 0 % (ref 0.0–0.2)

## 2020-12-10 LAB — LACTIC ACID, PLASMA
Lactic Acid, Venous: 3.5 mmol/L (ref 0.5–1.9)
Lactic Acid, Venous: 2.2 mmol/L (ref 0.5–1.9)
Lactic Acid, Venous: 1.3 mmol/L (ref 0.5–1.9)

## 2020-12-10 LAB — URINALYSIS, ROUTINE W REFLEX MICROSCOPIC
Bilirubin Urine: NEGATIVE
Glucose, UA: NEGATIVE mg/dL
Hgb urine dipstick: NEGATIVE
Ketones, ur: NEGATIVE mg/dL
Leukocytes,Ua: NEGATIVE
Nitrite: NEGATIVE
Protein, ur: NEGATIVE mg/dL
Specific Gravity, Urine: 1.014 (ref 1.005–1.030)
pH: 7 (ref 5.0–8.0)

## 2020-12-10 LAB — I-STAT CHEM 8, ED
BUN: 21 mg/dL — ABNORMAL HIGH (ref 6–20)
Calcium, Ion: 1.01 mmol/L — ABNORMAL LOW (ref 1.15–1.40)
Chloride: 104 mmol/L (ref 98–111)
Creatinine, Ser: 1.6 mg/dL — ABNORMAL HIGH (ref 0.61–1.24)
Glucose, Bld: 129 mg/dL — ABNORMAL HIGH (ref 70–99)
HCT: 35 % — ABNORMAL LOW (ref 39.0–52.0)
Hemoglobin: 11.9 g/dL — ABNORMAL LOW (ref 13.0–17.0)
Potassium: 4.5 mmol/L (ref 3.5–5.1)
Sodium: 137 mmol/L (ref 135–145)
TCO2: 23 mmol/L (ref 22–32)

## 2020-12-10 LAB — RESP PANEL BY RT-PCR (FLU A&B, COVID) ARPGX2
Influenza A by PCR: NEGATIVE
Influenza B by PCR: NEGATIVE
SARS Coronavirus 2 by RT PCR: NEGATIVE

## 2020-12-10 LAB — PROTIME-INR
INR: 1.3 — ABNORMAL HIGH (ref 0.8–1.2)
Prothrombin Time: 16.5 seconds — ABNORMAL HIGH (ref 11.4–15.2)

## 2020-12-10 LAB — HEMOGLOBIN A1C
Hgb A1c MFr Bld: 7.1 % — ABNORMAL HIGH (ref 4.8–5.6)
Mean Plasma Glucose: 157.07 mg/dL

## 2020-12-10 LAB — TROPONIN I (HIGH SENSITIVITY)
Troponin I (High Sensitivity): 6 ng/L (ref <18)
Troponin I (High Sensitivity): 7 ng/L (ref <18)

## 2020-12-10 LAB — CBG MONITORING, ED: Glucose-Capillary: 105 mg/dL — ABNORMAL HIGH (ref 70–99)

## 2020-12-10 MED ORDER — INSULIN DETEMIR 100 UNIT/ML FLEXPEN: 40.0000 [IU] | PEN_INJECTOR | Freq: Two times a day (BID) | SUBCUTANEOUS | Status: DC

## 2020-12-10 MED ORDER — VANCOMYCIN HCL 10 G IV SOLR
2500.0000 mg | Freq: Once | INTRAVENOUS | Status: AC
  Administered 2020-12-10: 2500 mg via INTRAVENOUS
  Filled 2020-12-10: qty 2500

## 2020-12-10 MED ORDER — ONDANSETRON HCL 4 MG PO TABS: 4.0000 mg | ORAL_TABLET | Freq: Four times a day (QID) | ORAL | Status: DC | PRN

## 2020-12-10 MED ORDER — METHOCARBAMOL 750 MG PO TABS
750.0000 mg | ORAL_TABLET | Freq: Four times a day (QID) | ORAL | Status: DC | PRN
  Administered 2020-12-12 – 2020-12-13 (×2): 750 mg via ORAL
  Filled 2020-12-10 (×2): qty 1

## 2020-12-10 MED ORDER — MELATONIN 3 MG PO TABS
3.0000 mg | ORAL_TABLET | Freq: Every evening | ORAL | Status: DC | PRN
  Administered 2020-12-11 – 2020-12-15 (×3): 3 mg via ORAL
  Filled 2020-12-10 (×3): qty 1

## 2020-12-10 MED ORDER — AMIODARONE HCL 200 MG PO TABS
200.0000 mg | ORAL_TABLET | Freq: Every day | ORAL | Status: DC
  Administered 2020-12-11 – 2020-12-16 (×6): 200 mg via ORAL
  Filled 2020-12-10 (×6): qty 1

## 2020-12-10 MED ORDER — DOCUSATE SODIUM 100 MG PO CAPS
100.0000 mg | ORAL_CAPSULE | Freq: Every day | ORAL | Status: DC
  Administered 2020-12-11 – 2020-12-16 (×6): 100 mg via ORAL
  Filled 2020-12-10 (×7): qty 1

## 2020-12-10 MED ORDER — ACETAMINOPHEN 500 MG PO TABS
1000.0000 mg | ORAL_TABLET | Freq: Once | ORAL | Status: AC
  Administered 2020-12-10: 1000 mg via ORAL
  Filled 2020-12-10: qty 2

## 2020-12-10 MED ORDER — METRONIDAZOLE 500 MG/100ML IV SOLN
500.0000 mg | Freq: Once | INTRAVENOUS | Status: AC
  Administered 2020-12-10: 500 mg via INTRAVENOUS
  Filled 2020-12-10: qty 100

## 2020-12-10 MED ORDER — ZINC SULFATE 220 (50 ZN) MG PO CAPS
220.0000 mg | ORAL_CAPSULE | Freq: Every day | ORAL | Status: DC
  Administered 2020-12-11 – 2020-12-16 (×6): 220 mg via ORAL
  Filled 2020-12-10 (×6): qty 1

## 2020-12-10 MED ORDER — LACTATED RINGERS IV BOLUS (SEPSIS)
1000.0000 mL | Freq: Once | INTRAVENOUS | Status: AC
  Administered 2020-12-10: 1000 mL via INTRAVENOUS

## 2020-12-10 MED ORDER — SODIUM CHLORIDE 0.9 % IV SOLN
2.0000 g | Freq: Three times a day (TID) | INTRAVENOUS | Status: DC
  Administered 2020-12-11 – 2020-12-13 (×7): 2 g via INTRAVENOUS
  Filled 2020-12-10 (×7): qty 2

## 2020-12-10 MED ORDER — TAMSULOSIN HCL 0.4 MG PO CAPS
0.4000 mg | ORAL_CAPSULE | Freq: Every day | ORAL | Status: DC
  Administered 2020-12-11 – 2020-12-16 (×6): 0.4 mg via ORAL
  Filled 2020-12-10 (×6): qty 1

## 2020-12-10 MED ORDER — LACTATED RINGERS IV BOLUS
800.0000 mL | Freq: Once | INTRAVENOUS | Status: AC
  Administered 2020-12-10: 800 mL via INTRAVENOUS

## 2020-12-10 MED ORDER — LEVOTHYROXINE SODIUM 75 MCG PO TABS
75.0000 ug | ORAL_TABLET | Freq: Every day | ORAL | Status: DC
  Administered 2020-12-11 – 2020-12-16 (×6): 75 ug via ORAL
  Filled 2020-12-10 (×6): qty 1

## 2020-12-10 MED ORDER — RIVAROXABAN 20 MG PO TABS
20.0000 mg | ORAL_TABLET | Freq: Every day | ORAL | Status: DC
  Administered 2020-12-11 – 2020-12-16 (×6): 20 mg via ORAL
  Filled 2020-12-10 (×7): qty 1

## 2020-12-10 MED ORDER — CALCIUM POLYCARBOPHIL 625 MG PO TABS
625.0000 mg | ORAL_TABLET | Freq: Every day | ORAL | Status: DC
  Administered 2020-12-11 – 2020-12-12 (×2): 625 mg via ORAL
  Filled 2020-12-10 (×2): qty 1

## 2020-12-10 MED ORDER — PREGABALIN 100 MG PO CAPS
300.0000 mg | ORAL_CAPSULE | Freq: Two times a day (BID) | ORAL | Status: DC
  Administered 2020-12-10 – 2020-12-16 (×13): 300 mg via ORAL
  Filled 2020-12-10 (×13): qty 3

## 2020-12-10 MED ORDER — INSULIN ASPART 100 UNIT/ML IJ SOLN: 0.0000 [IU] | INTRAMUSCULAR | Status: DC

## 2020-12-10 MED ORDER — POLYETHYLENE GLYCOL 3350 17 G PO PACK
17.0000 g | PACK | Freq: Every day | ORAL | Status: DC | PRN
  Administered 2020-12-12 – 2020-12-13 (×2): 17 g via ORAL
  Filled 2020-12-10 (×2): qty 1

## 2020-12-10 MED ORDER — NORTRIPTYLINE HCL 25 MG PO CAPS
50.0000 mg | ORAL_CAPSULE | Freq: Two times a day (BID) | ORAL | Status: DC
  Administered 2020-12-10 – 2020-12-16 (×13): 50 mg via ORAL
  Filled 2020-12-10 (×13): qty 2

## 2020-12-10 MED ORDER — SACCHAROMYCES BOULARDII 250 MG PO CAPS
250.0000 mg | ORAL_CAPSULE | Freq: Every day | ORAL | Status: DC
  Administered 2020-12-11 – 2020-12-16 (×6): 250 mg via ORAL
  Filled 2020-12-10 (×6): qty 1

## 2020-12-10 MED ORDER — SENNOSIDES-DOCUSATE SODIUM 8.6-50 MG PO TABS
1.0000 | ORAL_TABLET | Freq: Two times a day (BID) | ORAL | Status: DC
  Administered 2020-12-10 – 2020-12-16 (×11): 1 via ORAL
  Filled 2020-12-10 (×13): qty 1

## 2020-12-10 MED ORDER — ONDANSETRON HCL 4 MG/2ML IJ SOLN: 4.0000 mg | Freq: Four times a day (QID) | INTRAMUSCULAR | Status: DC | PRN

## 2020-12-10 MED ORDER — LACTATED RINGERS IV BOLUS (SEPSIS): 1800.0000 mL | Freq: Once | INTRAVENOUS | Status: DC

## 2020-12-10 MED ORDER — METRONIDAZOLE 500 MG/100ML IV SOLN
500.0000 mg | Freq: Three times a day (TID) | INTRAVENOUS | Status: DC
  Administered 2020-12-11 – 2020-12-12 (×4): 500 mg via INTRAVENOUS
  Filled 2020-12-10 (×4): qty 100

## 2020-12-10 MED ORDER — LACTATED RINGERS IV SOLN: INTRAVENOUS | Status: DC

## 2020-12-10 MED ORDER — INSULIN DETEMIR 100 UNIT/ML ~~LOC~~ SOLN
20.0000 [IU] | Freq: Two times a day (BID) | SUBCUTANEOUS | Status: DC
  Administered 2020-12-10: 20 [IU] via SUBCUTANEOUS
  Filled 2020-12-10 (×2): qty 0.2

## 2020-12-10 MED ORDER — ALBUTEROL SULFATE (2.5 MG/3ML) 0.083% IN NEBU: 2.5000 mg | INHALATION_SOLUTION | RESPIRATORY_TRACT | Status: DC | PRN

## 2020-12-10 MED ORDER — EZETIMIBE 10 MG PO TABS
10.0000 mg | ORAL_TABLET | Freq: Every day | ORAL | Status: DC
  Administered 2020-12-11 – 2020-12-16 (×6): 10 mg via ORAL
  Filled 2020-12-10 (×6): qty 1

## 2020-12-10 MED ORDER — PANTOPRAZOLE SODIUM 40 MG PO TBEC
40.0000 mg | DELAYED_RELEASE_TABLET | Freq: Two times a day (BID) | ORAL | Status: DC
  Administered 2020-12-10 – 2020-12-16 (×13): 40 mg via ORAL
  Filled 2020-12-10 (×13): qty 1

## 2020-12-10 MED ORDER — ACETAMINOPHEN 325 MG PO TABS
650.0000 mg | ORAL_TABLET | Freq: Four times a day (QID) | ORAL | Status: DC | PRN
  Administered 2020-12-11 – 2020-12-13 (×3): 650 mg via ORAL
  Filled 2020-12-10 (×4): qty 2

## 2020-12-10 MED ORDER — SODIUM CHLORIDE 0.9 % IV SOLN
2.0000 g | Freq: Once | INTRAVENOUS | Status: AC
  Administered 2020-12-10: 2 g via INTRAVENOUS
  Filled 2020-12-10: qty 2

## 2020-12-10 MED ORDER — VANCOMYCIN VARIABLE DOSE PER UNSTABLE RENAL FUNCTION (PHARMACIST DOSING): Status: DC

## 2020-12-10 MED ORDER — ATORVASTATIN CALCIUM 10 MG PO TABS
20.0000 mg | ORAL_TABLET | Freq: Every evening | ORAL | Status: DC
  Administered 2020-12-10 – 2020-12-16 (×7): 20 mg via ORAL
  Filled 2020-12-10 (×7): qty 2

## 2020-12-10 MED ORDER — WHITE PETROLATUM EX OINT
1.0000 "application " | TOPICAL_OINTMENT | Freq: Every evening | CUTANEOUS | Status: DC | PRN
  Administered 2020-12-11: 1 via TOPICAL
  Filled 2020-12-10: qty 28.35

## 2020-12-10 MED ORDER — ACETAMINOPHEN 650 MG RE SUPP: 650.0000 mg | Freq: Four times a day (QID) | RECTAL | Status: DC | PRN

## 2020-12-10 NOTE — ED Triage Notes (Signed)
Pt bib ems with reports of near syncope pta. Pt was outside walking when he had those episodes. 80 palpated on scene. Hx of sacral wounds. HR 110. Given 1L NS en route. Decreased dizziness on arrival to the ED. 12 lead unremarkable. Temp 99.0. CBG 176.

## 2020-12-10 NOTE — ED Notes (Signed)
Pt placed on 2L o2 due to o2 saturations 80%-84% while sleeping.

## 2020-12-10 NOTE — ED Provider Notes (Signed)
[facility] EMERGENCY DEPARTMENT Provider Note   CSN: [id number] Arrival date & time: 12/10/20  1644     History Chief Complaint  Patient presents with   Near Syncope    Carl Hill is a 50 y.o. male.  Presents ER with concern for near syncope.  Patient reports that while he was walking outside feeling warm and heat he got lightheaded and felt like he was going to pass out but did not pass out.  Was able to sit down.  Not aware of having fever at home.  Has not had cough, no dysuria or hematuria.  Patient states that his sacral wounds are healing quite well.  He has not noted any increase in redness or pain in his buttocks or groin region.  No chest pain or difficulty in breathing.  No abdominal pain.  Extensive past medical history, super morbid obesity.  Patient endorses history of necrotizing soft tissue infection in January, very complex hospitalization.  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   Super-super obese (HCC)    Urinary retention    Diabetic peripheral neuropathy (HCC)    Sacral pain    Debility 08/06/2020   Sepsis due to Streptococcus, group B (HCC) 06/22/2020   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   Acute respiratory failure (HCC)    Sepsis (HCC) 06/10/2020   Fournier gangrene 06/10/2020   DKA (diabetic ketoacidosis) (HCC) 06/10/2020   AKI (acute kidney injury) (HCC) 06/10/2020   Diabetic ulcer of heel (HCC) 06/10/2020   Atrial fibrillation, chronic (HCC) 06/10/2020   Essential hypertension 06/10/2020   Dyslipidemia 06/10/2020    Acquired hypothyroidism 06/10/2020   Chronic pain disorder 06/10/2020   Morbid obesity with BMI of 60.0-69.9, adult (HCC) 06/10/2020    Past Surgical History:  Procedure Laterality Date   ABDOMINAL SURGERY     INCISION AND DRAINAGE PERIRECTAL ABSCESS N/A 06/11/2020   Procedure: IRRIGATION AND DEBRIDEMENT PERINEAL WOUND;  Surgeon: Fritzi Mandes, MD;  Location: [facility] OR;  Service: General;  Laterality: N/A;   IRRIGATION AND DEBRIDEMENT ABSCESS N/A 06/10/2020   Procedure: IRRIGATION AND DEBRIDEMENT ABSCESS;  Surgeon: Fritzi Mandes, MD;  Location: [facility] OR;  Service: General;  Laterality: N/A;   LAPAROSCOPIC GASTRIC SLEEVE RESECTION         Family History  Problem Relation Age of Onset   Diabetes Mother    Healthy Brother    Breast cancer Paternal Grandmother    Pancreatic cancer Paternal Uncle     Social History   Tobacco Use   Smoking status: Every Day    Pack years: 0.00   Smokeless tobacco: Never   Tobacco comments:    1 cigarette per day  Vaping Use   Vaping Use: Never used  Substance Use Topics   Alcohol use: Not Currently    Comment: Has not had a drink in 1 year   Drug use: Never    Home Medications Prior to Admission medications   Medication Sig Start Date End Date Taking? Authorizing  Provider  acetaminophen (TYLENOL) 325 MG tablet Take 1-2 tablets (325-650 mg total) by mouth every 4 (four) hours as needed for mild pain. 09/06/20   Love, Evlyn Kanner, PA-C  albuterol (PROVENTIL) (2.5 MG/3ML) 0.083% nebulizer solution Take 3 mLs (2.5 mg total) by nebulization every 4 (four) hours as needed for wheezing or shortness of breath. 08/01/20   Russella Dar, NP  amiodarone (PACERONE) 200 MG tablet Take 1 tablet (200 mg total) by mouth daily. 09/12/20   Love, Evlyn Kanner, PA-C  amoxicillin-clavulanate (AUGMENTIN) 875-125 MG tablet Take 1 tablet by mouth every 8 (eight) hours. Patient not taking: Reported on 10/17/2020 09/12/20   Love, Evlyn Kanner, PA-C  atorvastatin (LIPITOR) 20 MG  tablet Take 1 tablet (20 mg total) by mouth every evening. 09/12/20   Love, Evlyn Kanner, PA-C  bisacodyl (DULCOLAX) 10 MG suppository Place 1 suppository (10 mg total) rectally daily as needed for moderate constipation. 09/06/20   Love, Evlyn Kanner, PA-C  diltiazem (CARDIZEM CD) 240 MG 24 hr capsule Take 1 capsule (240 mg total) by mouth daily. 09/12/20   Love, Evlyn Kanner, PA-C  docusate sodium (COLACE) 100 MG capsule Take 1 capsule (100 mg total) by mouth daily. 09/12/20   Love, Evlyn Kanner, PA-C  hydrocortisone (ANUSOL-HC) 2.5 % rectal cream Place rectally 4 (four) times daily. 09/12/20   Love, Evlyn Kanner, PA-C  insulin aspart (NOVOLOG) 100 UNIT/ML FlexPen Inject 10 Units into the skin 3 (three) times daily with meals. 09/12/20   Love, Evlyn Kanner, PA-C  insulin detemir (LEVEMIR) 100 UNIT/ML FlexPen Inject 40 Units into the skin 2 (two) times daily. 09/12/20   Love, Evlyn Kanner, PA-C  Insulin Pen Needle (COMFORT EZ PEN NEEDLES) 32G X 6 MM MISC 1 application by Does not apply route with breakfast, with lunch, and with evening meal. 09/12/20   Love, Evlyn Kanner, PA-C  levothyroxine (SYNTHROID) 75 MCG tablet Take 75 mcg by mouth daily. 04/03/20   [provider]  melatonin 3 MG TABS tablet Take 1 tablet (3 mg total) by mouth at bedtime as needed. 08/01/20   Russella Dar, NP  methocarbamol (ROBAXIN) 750 MG tablet Take 1 tablet (750 mg total) by mouth every 6 (six) hours as needed for muscle spasms. 09/12/20   Love, Evlyn Kanner, PA-C  morphine (MS CONTIN) 15 MG 12 hr tablet Take 1 tablet (15 mg total) by mouth every 12 (twelve) hours. 09/12/20   Love, Evlyn Kanner, PA-C  Multiple Vitamin (MULTIVITAMIN WITH MINERALS) TABS tablet Take 1 tablet by mouth daily. 08/02/20   Russella Dar, NP  nortriptyline (PAMELOR) 50 MG capsule Take 1 capsule (50 mg total) by mouth 2 (two) times daily. 09/12/20   Love, Evlyn Kanner, PA-C  oxyCODONE 10 MG TABS Take 1 tablet (10 mg total) by mouth every 6 (six) hours as needed for breakthrough pain. 09/12/20    Love, Evlyn Kanner, PA-C  pantoprazole (PROTONIX) 40 MG tablet Take 1 tablet (40 mg total) by mouth daily. 09/12/20   Love, Evlyn Kanner, PA-C  polycarbophil (FIBERCON) 625 MG tablet Take 1 tablet (625 mg total) by mouth daily. 09/12/20   Love, Evlyn Kanner, PA-C  polyethylene glycol (MIRALAX / GLYCOLAX) 17 g packet Take 17 g by mouth daily. 09/12/20   Love, Evlyn Kanner, PA-C  pregabalin (LYRICA) 300 MG capsule Take 1 capsule (300 mg total) by mouth 2 (two) times daily. 09/12/20   Love, Evlyn Kanner, PA-C  rivaroxaban (XARELTO) 20 MG TABS tablet Take 1 tablet (20 mg total)  by mouth daily with supper. 09/12/20   Love, Evlyn Kanner, PA-C  saccharomyces boulardii (FLORASTOR) 250 MG capsule Take 1 capsule (250 mg total) by mouth 2 (two) times daily. 08/05/20   Johnson, Clanford L, MD  senna-docusate (SENOKOT-S) 8.6-50 MG tablet Take 1 tablet by mouth 2 (two) times daily. 09/12/20   Love, Evlyn Kanner, PA-C  white petrolatum (VASELINE) OINT Apply 1 application topically daily. To both feet at nights and cover with socks 09/06/20   Love, Evlyn Kanner, PA-C  witch hazel-glycerin (TUCKS) pad Apply topically as needed for itching. 09/06/20   Love, Evlyn Kanner, PA-C    Allergies    Bee venom, Sglt2 inhibitors, and Other  Review of Systems   Review of Systems  Constitutional:  Positive for chills, fatigue and fever.  HENT:  Negative for ear pain and sore throat.   Eyes:  Negative for pain and visual disturbance.  Respiratory:  Negative for cough and shortness of breath.   Cardiovascular:  Negative for chest pain and palpitations.  Gastrointestinal:  Negative for abdominal pain and vomiting.  Genitourinary:  Negative for dysuria and hematuria.  Musculoskeletal:  Negative for arthralgias and back pain.  Skin:  Positive for rash and wound. Negative for color change.  Neurological:  Positive for syncope. Negative for seizures.  All other systems reviewed and are negative.  Physical Exam Updated Vital Signs BP (!) 113/50   Pulse 96   Temp 98.9  F (37.2 C) (Oral)   Resp 15   SpO2 92%   Physical Exam Vitals and nursing note reviewed.  Constitutional:      Appearance: He is well-developed.     Comments: Super morbidly obese  HENT:     Head: Normocephalic and atraumatic.  Eyes:     Conjunctiva/sclera: Conjunctivae normal.  Cardiovascular:     Rate and Rhythm: Normal rate and regular rhythm.     Heart sounds: No murmur heard. Pulmonary:     Effort: Pulmonary effort is normal. No respiratory distress.     Breath sounds: Normal breath sounds.  Abdominal:     Palpations: Abdomen is soft.     Tenderness: There is no abdominal tenderness.  Genitourinary:    Comments: Large sacral wound appears to be healing well. No surrounding erythema, no TTP throughout buttocks or groin. Musculoskeletal:     Cervical back: Neck supple.  Skin:    General: Skin is warm and dry.  Neurological:     Mental Status: He is alert.    ED Results / Procedures / Treatments   Labs (all labs ordered are listed, but only abnormal results are displayed) Labs Reviewed  LACTIC ACID, PLASMA - Abnormal; Notable for the following components:      Result Value   Lactic Acid, Venous 3.5 (*)    All other components within normal limits  LACTIC ACID, PLASMA - Abnormal; Notable for the following components:   Lactic Acid, Venous 2.2 (*)    All other components within normal limits  COMPREHENSIVE METABOLIC PANEL - Abnormal; Notable for the following components:   CO2 21 (*)    Glucose, Bld 144 (*)    Creatinine, Ser 1.53 (*)    Calcium 8.8 (*)    Albumin 3.0 (*)    GFR, Estimated 55 (*)    All other components within normal limits  CBC WITH DIFFERENTIAL/PLATELET - Abnormal; Notable for the following components:   Hemoglobin 12.2 (*)    MCH 24.9 (*)    RDW 18.1 (*)  All other components within normal limits  PROTIME-INR - Abnormal; Notable for the following components:   Prothrombin Time 16.5 (*)    INR 1.3 (*)    All other components within  normal limits  I-STAT CHEM 8, ED - Abnormal; Notable for the following components:   BUN 21 (*)    Creatinine, Ser 1.60 (*)    Glucose, Bld 129 (*)    Calcium, Ion 1.01 (*)    Hemoglobin 11.9 (*)    HCT 35.0 (*)    All other components within normal limits  RESP PANEL BY RT-PCR (FLU A&B, COVID) ARPGX2  CULTURE, BLOOD (ROUTINE X 2)  CULTURE, BLOOD (ROUTINE X 2)  URINE CULTURE  URINALYSIS, ROUTINE W REFLEX MICROSCOPIC  TROPONIN I (HIGH SENSITIVITY)  TROPONIN I (HIGH SENSITIVITY)    EKG EKG Interpretation  Date/Time:  Tuesday December 10 2020 17:05:01 EDT Ventricular Rate:  103 PR Interval:  169 QRS Duration: 124 QT Interval:  370 QTC Calculation: 485 R Axis:   -24 Text Interpretation: Sinus tachycardia Nonspecific intraventricular conduction delay ST elev, probable normal early repol pattern Baseline wander in lead(s) V1 Confirmed by Marianna Fuss ([id number]) on 12/10/2020 8:27:58 PM  Radiology DG Chest Port 1 View  Result Date: 12/10/2020 CLINICAL DATA:  Possible sepsis EXAM: PORTABLE CHEST 1 VIEW COMPARISON:  07/01/2020 FINDINGS: Cardiac shadow is stable. The lungs are hypoinflated. Right basilar atelectatic changes are noted. No focal confluent infiltrate is seen. No bony abnormality is noted. IMPRESSION: Mild right basilar atelectasis. Electronically Signed   By: Alcide Clever M.D.   On: 12/10/2020 19:16    Procedures Procedures   Medications Ordered in ED Medications  lactated ringers infusion ( Intravenous New Bag/Given 12/10/20 1821)  vancomycin (VANCOCIN) 2,500 mg in sodium chloride 0.9 % 500 mL IVPB (2,500 mg Intravenous New Bag/Given 12/10/20 2005)  lactated ringers bolus 1,000 mL (1,000 mLs Intravenous New Bag/Given 12/10/20 2005)  lactated ringers bolus 1,000 mL (0 mLs Intravenous Stopped 12/10/20 1831)  ceFEPIme (MAXIPIME) 2 g in sodium chloride 0.9 % 100 mL IVPB (0 g Intravenous Stopped 12/10/20 1857)  metroNIDAZOLE (FLAGYL) IVPB 500 mg (0 mg Intravenous Stopped 12/10/20  2000)  lactated ringers bolus 1,000 mL (0 mLs Intravenous Stopped 12/10/20 1935)  acetaminophen (TYLENOL) tablet 1,000 mg (1,000 mg Oral Given 12/10/20 1833)    ED Course  I have reviewed the triage vital signs and the nursing notes.  Pertinent labs & imaging results that were available during my care of the patient were reviewed by me and considered in my medical decision making (see chart for details).    MDM Rules/Calculators/A&P                          50 y/o male extensive past medical history including history of super morbid obesity, admission in January for necrotizing soft tissue infection requiring extensive surgery and debridements.  Came to ER with near syncope episode.  On arrival here patient noted to be hypotensive, febrile.  Raises concern for possibility of sepsis.  On exam patient actually appeared stable and not in extremis or distress.  His abdomen was soft, lungs were clear.  Examined his sacral wound and this appears to be healing well, there was no surrounding erythema or induration.  Gave fluid bolus, started broad-spectrum antibiotics lab work noted for no leukocytosis but initial lactate was elevated.  No pneumonia on CXR.  UA negative for infection.  Provided 3 L of fluids. Given super morbid  obesity did not give full 30cc/kg fluids. Had improvement and lactate and blood pressure.  Will defer further fluids to admitting team. Given his past history and initial vital signs, believe he would benefit from admission for further observation and empiric antibiotics.  No definite source for infection at present.  Discussed case with the hospitalist service, Dr. Julian ReilGardner will accept.  Will check CT abdomen pelvis to investigate further for source of fever.  He will follow up on results.   Final Clinical Impression(s) / ED Diagnoses Final diagnoses:  Hypotension, unspecified hypotension type  SIRS (systemic inflammatory response syndrome) (HCC)    Rx / DC Orders ED Discharge  Orders     None        Milagros Lollykstra, Kaipo Ardis S, MD 12/10/20 2349

## 2020-12-10 NOTE — Sepsis Progress Note (Signed)
Notified provider of need to order fluid bolus.  ?

## 2020-12-10 NOTE — ED Notes (Signed)
ED Provider notified of lactic 3.5

## 2020-12-10 NOTE — Sepsis Progress Note (Signed)
ELINK tracking the Code Sepsis. 

## 2020-12-10 NOTE — ED Notes (Signed)
ED Provider at bedside. 

## 2020-12-10 NOTE — Sepsis Progress Note (Signed)
Notified bedside nurse of need to draw blood cultures.  

## 2020-12-10 NOTE — Progress Notes (Addendum)
Pharmacy Antibiotic Note  Carl Hill is a 50 y.o. male admitted on 12/10/2020 with sepsis.  Pharmacy has been consulted for vancomycin dosing.  Hx of sacral wounds. Patient in AKI.  Plan: Cefepime 2g q8h LD with 2.5g Vancomycin - subsequent dosing to be determined by renal function and by levels until renal function stable F/u clinical course and cultures De-escalate antibiotics when appropriate     Temp (24hrs), Avg:101.1 F (38.4 C), Min:101.1 F (38.4 C), Max:101.1 F (38.4 C)  Recent Labs  Lab 12/10/20 1725  WBC 9.8    CrCl cannot be calculated (Patient's most recent lab result is older than the maximum 21 days allowed.).    Allergies  Allergen Reactions   Bee Venom Anaphylaxis    Other reaction(s): Unknown Other reaction(s): Unknown Other reaction(s): Unknown    Sglt2 Inhibitors Other (See Comments)    Necrotizing infection of the perineum   Other Hives    Antimicrobials this admission: cefepime 7/12 >>  vancomycin 7/12 >>  Metronidazole 7/12 >>  Microbiology results: Pending  Thank you for allowing pharmacy to be a part of this patient's care.  Carl Hill 12/10/2020 5:51 PM

## 2020-12-10 NOTE — H&P (Signed)
History and Physical    Carl Hill [id number]:[id number] DOB: August 13, 1970 DOA: 12/10/2020  PCP: Malka So., MD  Patient coming from: Home  I have personally briefly reviewed patient's old medical records in [facility]  Chief Complaint: Near syncope  HPI: Carl Hill is a 50 y.o. male with medical history significant of DM2, HTN, morbid obesity, a.fib on amiodarone and xarelto.  Pt with h/o fournier's gangrene and sepsis earlier this year.  Pt with h/o sacral decubitus, though this has been healing quite well according to patient.  Pt presents to ED with c/o near syncope that occurred this afternoon while outside walking.  Had multiple episodes of near syncope, EMS called.  HR 110, BP 80 on scene.   ED Course: Tm 101.1.  BP as low as 69 systolic initially.  Now 100/44 after a 2nd L bolus in ED.  Initial lactate 3.5 -> 2.2  WBC 9.8k.  CXR neg, UA neg, COVID neg.  Creat 1.5 up from 0.8 baseline.  Trop neg.  Put on empiric cefepime, flagyl, vanc.  Pt denies symptoms at this time.  No sacral or groin pain, no CP, no SOB, says he feels 'fine' now.   Review of Systems: As per HPI, otherwise all review of systems negative.  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   Asthma    Atrial fibrillation (HCC)    Diabetes mellitus without complication (HCC)    History of cardioversion    3 times    Hypertension    Sepsis due to Streptococcus, group B (HCC) 06/22/2020    Past Surgical History:  Procedure Laterality Date   ABDOMINAL SURGERY     INCISION AND DRAINAGE PERIRECTAL ABSCESS N/A 06/11/2020   Procedure: IRRIGATION AND DEBRIDEMENT PERINEAL WOUND;  Surgeon: Fritzi Mandes, MD;  Location: [facility] OR;  Service: General;  Laterality: N/A;   IRRIGATION AND DEBRIDEMENT ABSCESS N/A 06/10/2020   Procedure: IRRIGATION AND DEBRIDEMENT ABSCESS;  Surgeon: Fritzi Mandes, MD;  Location: [facility] OR;  Service: General;  Laterality: N/A;   LAPAROSCOPIC  GASTRIC SLEEVE RESECTION       reports that he has been smoking. He has never used smokeless tobacco. He reports previous alcohol use. He reports that he does not use drugs.  Allergies  Allergen Reactions   Bee Venom Anaphylaxis    Other reaction(s): Unknown Other reaction(s): Unknown Other reaction(s): Unknown    Sglt2 Inhibitors Other (See Comments)    Necrotizing infection of the perineum   Other Hives    Family History  Problem Relation Age of Onset   Diabetes Mother    Healthy Brother    Breast cancer Paternal Grandmother    Pancreatic cancer Paternal Uncle      Prior to Admission medications   Medication Sig Start Date End Date Taking? Authorizing Provider  acetaminophen (TYLENOL) 325 MG tablet Take 1-2 tablets (325-650 mg total) by mouth every 4 (four) hours as needed for mild pain. 09/06/20  Yes Love, Evlyn Kanner, PA-C  albuterol (PROVENTIL) (2.5 MG/3ML) 0.083% nebulizer solution Take 3 mLs (2.5 mg total) by nebulization every 4 (four) hours as needed for wheezing or shortness of breath. 08/01/20  Yes Russella Dar, NP  amiodarone (PACERONE) 200 MG tablet Take 1 tablet (200 mg total) by mouth daily. 09/12/20  Yes Love, Evlyn Kanner, PA-C  atorvastatin (LIPITOR) 20 MG tablet Take 1 tablet (20 mg total) by mouth every evening. 09/12/20  Yes Love, Evlyn Kanner, PA-C  bisacodyl (  DULCOLAX) 10 MG suppository Place 1 suppository (10 mg total) rectally daily as needed for moderate constipation. 09/06/20  Yes Love, Evlyn Kanner, PA-C  Cholecalciferol (VITAMIN D3) 125 MCG (5000 UT) TABS Take 5,000 Units by mouth daily.   Yes [provider]  diltiazem (CARDIZEM CD) 240 MG 24 hr capsule Take 1 capsule (240 mg total) by mouth daily. 09/12/20  Yes Love, Evlyn Kanner, PA-C  docusate sodium (COLACE) 100 MG capsule Take 1 capsule (100 mg total) by mouth daily. 09/12/20  Yes Love, Evlyn Kanner, PA-C  ezetimibe (ZETIA) 10 MG tablet Take 10 mg by mouth daily.   Yes [provider]  hydrocortisone  (ANUSOL-HC) 2.5 % rectal cream Place rectally 4 (four) times daily. Patient taking differently: Place 1 application rectally 4 (four) times daily as needed for hemorrhoids or anal itching. 09/12/20  Yes Love, Evlyn Kanner, PA-C  insulin aspart (NOVOLOG) 100 UNIT/ML FlexPen Inject 10 Units into the skin 3 (three) times daily with meals. Patient taking differently: Inject 10-12 Units into the skin 3 (three) times daily with meals. 09/12/20  Yes Love, Evlyn Kanner, PA-C  insulin detemir (LEVEMIR) 100 UNIT/ML FlexPen Inject 40 Units into the skin 2 (two) times daily. 09/12/20  Yes Love, Evlyn Kanner, PA-C  Insulin Pen Needle (COMFORT EZ PEN NEEDLES) 32G X 6 MM MISC 1 application by Does not apply route with breakfast, with lunch, and with evening meal. 09/12/20  Yes Love, Evlyn Kanner, PA-C  levothyroxine (SYNTHROID) 75 MCG tablet Take 75 mcg by mouth daily. 04/03/20  Yes [provider]  lisinopril (ZESTRIL) 5 MG tablet Take 2.5 mg by mouth daily. 11/05/20  Yes [provider]  melatonin 3 MG TABS tablet Take 1 tablet (3 mg total) by mouth at bedtime as needed. Patient taking differently: Take 3 mg by mouth at bedtime as needed (sleep). 08/01/20  Yes Russella Dar, NP  methocarbamol (ROBAXIN) 750 MG tablet Take 1 tablet (750 mg total) by mouth every 6 (six) hours as needed for muscle spasms. 09/12/20  Yes Love, Evlyn Kanner, PA-C  Multiple Vitamins-Minerals (ALIVE MENS ENERGY PO) Take 1 tablet by mouth daily.   Yes [provider]  nortriptyline (PAMELOR) 50 MG capsule Take 1 capsule (50 mg total) by mouth 2 (two) times daily. 09/12/20  Yes Love, Evlyn Kanner, PA-C  omeprazole (PRILOSEC) 20 MG capsule Take 20 mg by mouth 2 (two) times daily. 10/07/20  Yes [provider]  pantoprazole (PROTONIX) 40 MG tablet Take 1 tablet (40 mg total) by mouth daily. 09/12/20  Yes Love, Evlyn Kanner, PA-C  polycarbophil (FIBERCON) 625 MG tablet Take 1 tablet (625 mg total) by mouth daily. 09/12/20  Yes Love, Evlyn Kanner, PA-C   polyethylene glycol (MIRALAX / GLYCOLAX) 17 g packet Take 17 g by mouth daily. Patient taking differently: Take 17 g by mouth daily as needed for mild constipation. 09/12/20  Yes Love, Evlyn Kanner, PA-C  pregabalin (LYRICA) 300 MG capsule Take 1 capsule (300 mg total) by mouth 2 (two) times daily. 09/12/20  Yes Love, Evlyn Kanner, PA-C  rivaroxaban (XARELTO) 20 MG TABS tablet Take 1 tablet (20 mg total) by mouth daily with supper. 09/12/20  Yes Love, Evlyn Kanner, PA-C  saccharomyces boulardii (FLORASTOR) 250 MG capsule Take 1 capsule (250 mg total) by mouth 2 (two) times daily. Patient taking differently: Take 250 mg by mouth daily. 08/05/20  Yes Johnson, Clanford L, MD  senna-docusate (SENOKOT-S) 8.6-50 MG tablet Take 1 tablet by mouth 2 (two) times daily.  09/12/20  Yes Love, Evlyn Kanner, PA-C  tamsulosin (FLOMAX) 0.4 MG CAPS capsule Take 0.4 mg by mouth daily. 10/24/20  Yes [provider]  VITAMIN A PO Take 1 capsule by mouth daily.   Yes [provider]  white petrolatum (VASELINE) OINT Apply 1 application topically daily. To both feet at nights and cover with socks Patient taking differently: Apply 1 application topically at bedtime as needed for dry skin. To both feet at nights and cover with socks 09/06/20  Yes Love, Evlyn Kanner, PA-C  witch hazel-glycerin (TUCKS) pad Apply topically as needed for itching. Patient taking differently: Apply 1 application topically as needed for itching. 09/06/20  Yes Love, Evlyn Kanner, PA-C  zinc gluconate 50 MG tablet Take 50 mg by mouth daily.   Yes [provider]    Physical Exam: Vitals:   12/10/20 2037 12/10/20 2045 12/10/20 2100 12/10/20 2115  BP:  102/69 (!) 106/55 (!) 100/44  Pulse:  97 94 92  Resp:   18   Temp:      TempSrc:      SpO2:   92% 91%  Weight: (!) 217.7 kg     Height: 6\' 6"  (1.981 m)       Constitutional: NAD, calm, comfortable Eyes: PERRL, lids and conjunctivae normal ENMT: Mucous membranes are moist. Posterior pharynx clear  of any exudate or lesions.Normal dentition.  Neck: normal, supple, no masses, no thyromegaly Respiratory: clear to auscultation bilaterally, no wheezing, no crackles. Normal respiratory effort. No accessory muscle use.  Cardiovascular: Regular rate and rhythm, no murmurs / rubs / gallops. No extremity edema. 2+ pedal pulses. No carotid bruits.  Abdomen: no tenderness, no masses palpated. No hepatosplenomegaly. Bowel sounds positive.  Musculoskeletal: no clubbing / cyanosis. No joint deformity upper and lower extremities. Good ROM, no contractures. Normal muscle tone.  Skin: Sacral decubitus and perineal area appear healing, no obvious cellulitis or infection. Neurologic: CN 2-12 grossly intact. Sensation intact, DTR normal. Strength 5/5 in all 4.  Psychiatric: Normal judgment and insight. Alert and oriented x 3. Normal mood.    Labs on Admission: I have personally reviewed following labs and imaging studies  CBC: Recent Labs  Lab 12/10/20 1725 12/10/20 1821  WBC 9.8  --   NEUTROABS 6.5  --   HGB 12.2* 11.9*  HCT 39.7 35.0*  MCV 81.0  --   PLT 231  --    Basic Metabolic Panel: Recent Labs  Lab 12/10/20 1725 12/10/20 1821  NA 135 137  K 4.5 4.5  CL 103 104  CO2 21*  --   GLUCOSE 144* 129*  BUN 20 21*  CREATININE 1.53* 1.60*  CALCIUM 8.8*  --    GFR: Estimated Creatinine Clearance: 112.1 mL/min (A) (by C-G formula based on SCr of 1.6 mg/dL (H)). Liver Function Tests: Recent Labs  Lab 12/10/20 1725  AST 23  ALT 18  ALKPHOS 68  BILITOT 0.7  PROT 7.0  ALBUMIN 3.0*   No results for input(s): LIPASE, AMYLASE in the last 168 hours. No results for input(s): AMMONIA in the last 168 hours. Coagulation Profile: Recent Labs  Lab 12/10/20 1725  INR 1.3*   Cardiac Enzymes: No results for input(s): CKTOTAL, CKMB, CKMBINDEX, TROPONINI in the last 168 hours. BNP (last 3 results) No results for input(s): PROBNP in the last 8760 hours. HbA1C: No results for input(s):  HGBA1C in the last 72 hours. CBG: No results for input(s): GLUCAP in the last 168 hours. Lipid Profile: No results for  input(s): CHOL, HDL, LDLCALC, TRIG, CHOLHDL, LDLDIRECT in the last 72 hours. Thyroid Function Tests: No results for input(s): TSH, T4TOTAL, FREET4, T3FREE, THYROIDAB in the last 72 hours. Anemia Panel: No results for input(s): VITAMINB12, FOLATE, FERRITIN, TIBC, IRON, RETICCTPCT in the last 72 hours. Urine analysis:    Component Value Date/Time   COLORURINE YELLOW 12/10/2020 2003   APPEARANCEUR CLEAR 12/10/2020 2003   LABSPEC 1.014 12/10/2020 2003   PHURINE 7.0 12/10/2020 2003   GLUCOSEU NEGATIVE 12/10/2020 2003   HGBUR NEGATIVE 12/10/2020 2003   BILIRUBINUR NEGATIVE 12/10/2020 2003   KETONESUR NEGATIVE 12/10/2020 2003   PROTEINUR NEGATIVE 12/10/2020 2003   NITRITE NEGATIVE 12/10/2020 2003   LEUKOCYTESUR NEGATIVE 12/10/2020 2003    Radiological Exams on Admission: DG Chest Port 1 View  Result Date: 12/10/2020 CLINICAL DATA:  Possible sepsis EXAM: PORTABLE CHEST 1 VIEW COMPARISON:  07/01/2020 FINDINGS: Cardiac shadow is stable. The lungs are hypoinflated. Right basilar atelectatic changes are noted. No focal confluent infiltrate is seen. No bony abnormality is noted. IMPRESSION: Mild right basilar atelectasis. Electronically Signed   By: Alcide Clever M.D.   On: 12/10/2020 19:16    EKG: Independently reviewed.  Assessment/Plan Principal Problem:   Sepsis (HCC) Active Problems:   AKI (acute kidney injury) (HCC)   Atrial fibrillation, chronic (HCC)   Essential hypertension   Chronic pain disorder   Morbid obesity with BMI of 60.0-69.9, adult (HCC)   DM2 (diabetes mellitus, type 2) (HCC)    Sepsis - Unclear source Sepsis pathway Empiric cefepime, flagyl, vanc Getting CT AP w/o contrast given recent fournier's gangrene, sacral decubitus (though these look to be healing from physical exam) BCx pending IVF: 2.8L total bolus AKI - IVF as above Hold BP meds  due to low BP on presentation Strict intake and output Repeat CMP in AM HTN - Holding BP meds due to soft BP on presentation A.Fib - Currently sinus rhythm Cont xarelto (next dose due tomorrow) Cont amiodarone Hold cardizem DM2 - Pt NPO pending CT Takes Levemir 40u BID at home + 10-12u novolog TID AC Will put on Levemir 20u BID for the moment + sensitive scale SSI Q4H for the moment Chronic pain - Cont lyrica and pamelor  DVT prophylaxis: Xarelto Code Status: Full Family Communication: No family in room Disposition Plan: Home after sepsis resolved Consults called: None Admission status: Admit to inpatient  Severity of Illness: The appropriate patient status for this patient is INPATIENT. Inpatient status is judged to be reasonable and necessary in order to provide the required intensity of service to ensure the patient's safety. The patient's presenting symptoms, physical exam findings, and initial radiographic and laboratory data in the context of their chronic comorbidities is felt to place them at high risk for further clinical deterioration. Furthermore, it is not anticipated that the patient will be medically stable for discharge from the hospital within 2 midnights of admission. The following factors support the patient status of inpatient.   IP status due to sepsis with hypotension and lactic acidosis on presentation.   * I certify that at the point of admission it is my clinical judgment that the patient will require inpatient hospital care spanning beyond 2 midnights from the point of admission due to high intensity of service, high risk for further deterioration and high frequency of surveillance required.*   Mayukha Symmonds M. DO [facility]  How to contact the [facility] Attending or Consulting provider 7A - 7P or covering provider during after hours 7P -7A, for this  patient?  Check the care team in [facility] and look for a) attending/consulting TR[facility] provider listed and b)  the [facility] team listed Log into [email or web address]  Amion Physician Scheduling and messaging for groups and whole hospitals  On call and physician scheduling software for group practices, residents, hospitalists and other medical providers for call, clinic, rotation and shift schedules. OnCall Enterprise is a hospital-wide system for scheduling doctors and paging doctors on call. EasyPlot is for scientific plotting and data analysis.  [email or web address]  and use [facility]'s universal password to access. If you do not have the password, please contact the hospital operator.  Locate the [facility] provider you are looking for under [facility] and page to a number that you can be directly reached. If you still have difficulty reaching the provider, please page the [facility] (Director on Call) for the Hospitalists listed on amion for assistance.  12/10/2020, 10:05 PM

## 2020-12-11 ENCOUNTER — Inpatient Hospital Stay (HOSPITAL_COMMUNITY): Payer: Medicaid Other

## 2020-12-11 LAB — CBC
HCT: 35.5 % — ABNORMAL LOW (ref 39.0–52.0)
Hemoglobin: 11 g/dL — ABNORMAL LOW (ref 13.0–17.0)
MCH: 24.9 pg — ABNORMAL LOW (ref 26.0–34.0)
MCHC: 31 g/dL (ref 30.0–36.0)
MCV: 80.5 fL (ref 80.0–100.0)
Platelets: 186 10*3/uL (ref 150–400)
RBC: 4.41 MIL/uL (ref 4.22–5.81)
RDW: 18.5 % — ABNORMAL HIGH (ref 11.5–15.5)
WBC: 8.5 10*3/uL (ref 4.0–10.5)
nRBC: 0 % (ref 0.0–0.2)

## 2020-12-11 LAB — COMPREHENSIVE METABOLIC PANEL
ALT: 17 U/L (ref 0–44)
AST: 20 U/L (ref 15–41)
Albumin: 2.6 g/dL — ABNORMAL LOW (ref 3.5–5.0)
Alkaline Phosphatase: 57 U/L (ref 38–126)
Anion gap: 8 (ref 5–15)
BUN: 18 mg/dL (ref 6–20)
CO2: 24 mmol/L (ref 22–32)
Calcium: 8.5 mg/dL — ABNORMAL LOW (ref 8.9–10.3)
Chloride: 104 mmol/L (ref 98–111)
Creatinine, Ser: 1.24 mg/dL (ref 0.61–1.24)
GFR, Estimated: >60 mL/min (ref >60)
Glucose, Bld: 108 mg/dL — ABNORMAL HIGH (ref 70–99)
Potassium: 3.9 mmol/L (ref 3.5–5.1)
Sodium: 136 mmol/L (ref 135–145)
Total Bilirubin: 0.8 mg/dL (ref 0.3–1.2)
Total Protein: 6.1 g/dL — ABNORMAL LOW (ref 6.5–8.1)

## 2020-12-11 LAB — LACTIC ACID, PLASMA: Lactic Acid, Venous: 2.1 mmol/L (ref 0.5–1.9)

## 2020-12-11 LAB — CBG MONITORING, ED
Glucose-Capillary: 106 mg/dL — ABNORMAL HIGH (ref 70–99)
Glucose-Capillary: 115 mg/dL — ABNORMAL HIGH (ref 70–99)
Glucose-Capillary: 140 mg/dL — ABNORMAL HIGH (ref 70–99)

## 2020-12-11 LAB — PROTIME-INR
INR: 1.2 (ref 0.8–1.2)
Prothrombin Time: 15.1 seconds (ref 11.4–15.2)

## 2020-12-11 LAB — PROCALCITONIN: Procalcitonin: <0.1 ng/mL

## 2020-12-11 LAB — GLUCOSE, CAPILLARY
Glucose-Capillary: 158 mg/dL — ABNORMAL HIGH (ref 70–99)
Glucose-Capillary: 167 mg/dL — ABNORMAL HIGH (ref 70–99)

## 2020-12-11 LAB — CORTISOL-AM, BLOOD: Cortisol - AM: 2.9 ug/dL — ABNORMAL LOW (ref 6.7–22.6)

## 2020-12-11 MED ORDER — INSULIN DETEMIR 100 UNIT/ML ~~LOC~~ SOLN
40.0000 [IU] | Freq: Two times a day (BID) | SUBCUTANEOUS | Status: DC
  Administered 2020-12-11 – 2020-12-16 (×12): 40 [IU] via SUBCUTANEOUS
  Filled 2020-12-11 (×14): qty 0.4

## 2020-12-11 MED ORDER — HYDROCORTISONE 5 MG PO TABS
5.0000 mg | ORAL_TABLET | Freq: Every day | ORAL | Status: DC
  Administered 2020-12-11 – 2020-12-14 (×4): 5 mg via ORAL
  Filled 2020-12-11 (×5): qty 1

## 2020-12-11 MED ORDER — HYDROCORTISONE 5 MG PO TABS
5.0000 mg | ORAL_TABLET | Freq: Every day | ORAL | Status: DC
  Administered 2020-12-12 – 2020-12-16 (×4): 5 mg via ORAL
  Filled 2020-12-11 (×2): qty 1

## 2020-12-11 MED ORDER — LACTATED RINGERS IV SOLN: INTRAVENOUS | Status: DC

## 2020-12-11 MED ORDER — INSULIN ASPART 100 UNIT/ML IJ SOLN
0.0000 [IU] | Freq: Three times a day (TID) | INTRAMUSCULAR | Status: DC
  Administered 2020-12-11 (×2): 3 [IU] via SUBCUTANEOUS
  Administered 2020-12-11 – 2020-12-13 (×4): 2 [IU] via SUBCUTANEOUS
  Administered 2020-12-14: 3 [IU] via SUBCUTANEOUS
  Administered 2020-12-14: 5 [IU] via SUBCUTANEOUS
  Administered 2020-12-15 – 2020-12-16 (×3): 2 [IU] via SUBCUTANEOUS

## 2020-12-11 MED ORDER — HYDROCORTISONE 10 MG PO TABS
10.0000 mg | ORAL_TABLET | Freq: Every day | ORAL | Status: DC
  Administered 2020-12-11 – 2020-12-12 (×2): 10 mg via ORAL
  Filled 2020-12-11 (×2): qty 1

## 2020-12-11 MED ORDER — INSULIN ASPART 100 UNIT/ML IJ SOLN
6.0000 [IU] | Freq: Three times a day (TID) | INTRAMUSCULAR | Status: DC
  Administered 2020-12-11: 6 [IU] via SUBCUTANEOUS

## 2020-12-11 MED ORDER — INSULIN DETEMIR 100 UNIT/ML ~~LOC~~ SOLN
30.0000 [IU] | Freq: Two times a day (BID) | SUBCUTANEOUS | Status: DC
  Filled 2020-12-11 (×2): qty 0.3

## 2020-12-11 MED ORDER — INSULIN ASPART 100 UNIT/ML IJ SOLN
10.0000 [IU] | Freq: Three times a day (TID) | INTRAMUSCULAR | Status: DC
  Administered 2020-12-11 – 2020-12-16 (×14): 10 [IU] via SUBCUTANEOUS

## 2020-12-11 MED ORDER — CHLORHEXIDINE GLUCONATE CLOTH 2 % EX PADS
6.0000 | MEDICATED_PAD | Freq: Every day | CUTANEOUS | Status: DC
  Administered 2020-12-11 – 2020-12-16 (×5): 6 via TOPICAL

## 2020-12-11 MED ORDER — COSYNTROPIN 0.25 MG IJ SOLR: 0.2500 mg | Freq: Once | INTRAMUSCULAR | Status: DC

## 2020-12-11 MED ORDER — LINEZOLID 600 MG/300ML IV SOLN
600.0000 mg | Freq: Two times a day (BID) | INTRAVENOUS | Status: DC
  Administered 2020-12-11 – 2020-12-12 (×2): 600 mg via INTRAVENOUS
  Filled 2020-12-11 (×3): qty 300

## 2020-12-11 NOTE — TOC Initial Note (Signed)
Transition of Care [facility]) - Initial/Assessment Note    Patient Details  Name: Carl Hill MRN: [id number] Date of Birth: [birth year]  [facility] CM/SW Contact:    Lawerance Sabal, RN Phone Number: 12/11/2020, 2:46 PM  Clinical Narrative:          Patient from home, lives with mother. Bariatric, weight 478 lbs.  Attempt to reach patient, his cell phone is dead, his mother will bring charger tomorrow morning. Spoke w patient's mother. She is his primary caregiver.  She states that for transportation to appointments they use ambulance transport due to patient's weight and inability to ambulate steps in and out of home. She states that they have the people who can build a ramp, but they can't afford the lumber. Metal ramps do not support his weight, and renting a ramp is too expensive. CM spoke w Maud Deed from [employer] who confirms that ramps are an out of pocket expense that are not covered by insurance or medicaid. Patient will need nonemergency medical transport at DC.     DME- Patient's mother states they have a bedside toilet, RW, and bariatric rollator at home. She states they are working on getting a Multimedia programmer. CM offered to get bari tub bench as it is covered by Medicaid, but she declined. CM consulted Shiela w [employer] for options for bari shower seat that could fit inside tub, and order for bari shower stool placed for delivery to home.    Home Health -Verified that patient is active with [facility] for RN and PT. Mother would like that to continue after DC.          Expected Discharge Plan: Home w Home Health Services Barriers to Discharge: Continued Medical Work up   Patient Goals and CMS Choice        Expected Discharge Plan and Services Expected Discharge Plan: Home w Home Health Services   Discharge Planning Services: CM Consult   Living arrangements for the past 2 months: Single Family Home                           HH Arranged: PT, RN [facility] Agency:  [facility] Date HH Agency Contacted: 12/11/20 Time HH Agency Contacted: 1445 Representative spoke with at [facility] Agency: Pearson Grippe  Prior Living Arrangements/Services Living arrangements for the past 2 months: Single Family Home                Current home services: Home PT, Home RN, DME    Activities of Daily Living      Permission Sought/Granted                  Emotional Assessment              Admission diagnosis:  SIRS (systemic inflammatory response syndrome) (HCC) [R65.10] Sepsis (HCC) [A41.9] Hypotension, unspecified hypotension type [I95.9] Patient Active Problem List   Diagnosis Date Noted   DM2 (diabetes mellitus, type 2) (HCC) 12/10/2020   H/O small bowel obstruction    Muscle spasms of both lower extremities 08/15/2020   Recurrent knee instability, 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   Super-super obese (HCC)    Urinary retention    Diabetic peripheral neuropathy (HCC)    Sacral pain    Debility 08/06/2020   Sepsis due to Streptococcus, group B (HCC) 06/22/2020   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   Acute respiratory failure (HCC)    Sepsis (HCC) 06/10/2020   Fournier gangrene 06/10/2020  DKA (diabetic ketoacidosis) (HCC) 06/10/2020   AKI (acute kidney injury) (HCC) 06/10/2020   Diabetic ulcer of heel (HCC) 06/10/2020   Atrial fibrillation, chronic (HCC) 06/10/2020   Essential hypertension 06/10/2020   Dyslipidemia 06/10/2020   Acquired hypothyroidism 06/10/2020   Chronic pain disorder 06/10/2020   Morbid obesity with BMI of 60.0-69.9, adult (HCC) 06/10/2020   PCP:  Malka So., MD Pharmacy:   [facility] [address], [place] - [address] [facility] [place] [address] Phone: [phone] Fax: [phone]     Social Determinants of Health (SDOH) Interventions    Readmission Risk Interventions Readmission Risk Prevention Plan 07/26/2020  Transportation Screening Complete  PCP or Specialist Appt within 3-5 Days  Complete  HRI or Home Care Consult Complete  Social Work Consult for Recovery Care Planning/Counseling Complete  Palliative Care Screening Complete  Medication Review Oceanographer) Complete  Some recent data might be hidden

## 2020-12-11 NOTE — Progress Notes (Signed)
[name] [id number] Admission Data: 12/11/2020 5:34 PM Attending Provider: Alba Cory, MD  YBW:[facility], [name]., MD Consults/ Treatment Team:   Carl Hill is a 50 y.o. male patient admitted from ED awake, alert  & orientated  X 3,  Full Code, VSS - Blood pressure 117/70, pulse 96, temperature 98 F (36.7 C), temperature source Oral, resp. rate 16, height 6\' 6"  (1.981 m), weight (!) 217.7 kg, SpO2 94 %., O2  on room air, no c/o shortness of breath, no c/o chest pain, no distress noted. Tele # [id number] placed and pt is currently running:normal sinus rhythm.  Pt orientation to unit, room and routine. Information packet given to patient/family and safety video watched.  Admission INP armband ID verified with patient/family, and in place. SR up x 2, fall risk assessment complete with Patient and family verbalizing understanding of risks associated with falls. Pt verbalizes an understanding of how to use the call bell and to call for help before getting out of bed.   Will cont to monitor and assist as needed.  , RN 12/11/2020 13:11

## 2020-12-11 NOTE — ED Notes (Signed)
Attempted to call report, no answer

## 2020-12-11 NOTE — ED Notes (Signed)
Patient transported to CT 

## 2020-12-11 NOTE — Progress Notes (Signed)
PROGRESS NOTE    Carl Hill  YYT:[id number] DOB: 07/16/1970 DOA: 12/10/2020 PCP: Malka So., MD   Brief Narrative: 50 year old with past medical history significant for diabetes, hypertension, morbid obesity, A. fib on amiodarone and Xarelto, history of Fournier gangrene and sepsis earlier this year, and had a prolonged hospital course, history of sacral decubitus,  this has been healing.  Patient presents to the ED complaining of 3 near syncope episode occurred the afternoon of admission. On evaluation heart rate was 110 and systolic blood pressure was in the 80s.  In the ED that he had a temperature of 101.  Blood pressure in the ED as low as 69.    Assessment & Plan:   Active Problems:   AKI (acute kidney injury) (HCC)   Atrial fibrillation, chronic (HCC)   Essential hypertension   Chronic pain disorder   Morbid obesity with BMI of 60.0-69.9, adult (HCC)   DM2 (diabetes mellitus, type 2) (HCC)  1-SIRS: Patient presents with fever, tachycardia tachypnea. No source for infection identified so far. Follow blood cultures; no growth in the last 12 hours Chest x-ray bilateral atelectasis.  Patient denies cough CT abdomen and pelvis: No acute abnormality of the abdomen or pelvis.  Soft tissue thickening at the upper left gluteal cleft without fluid collection. Continue with IV antibiotics for 24 hours  2-Hypotension: Suspect this is related to adrenal insufficiency.  Cortisol was low at 2.9. Will proceed with initiation of hydrocortisone .  Will arrange follow-up with endocrinologist for cosynthroipin test.   3-AKI: Continue with IV fluids Hold lisinopril  Hypertension: Continue to hold lisinopril  A. fib: Continue with Xarelto and amiodarone.  Hold Cardizem due to hypotension  Diabetes type 2: We will increase Levemir to 40 units twice daily and increase meal coverage to 10 units 3 times daily  Chronic pain; continue with lyrica and pamelor.     Estimated  body mass index is 55.47 kg/m as calculated from the following:   Height as of this encounter: 6\' 6"  (1.981 m).   Weight as of this encounter: 217.7 kg.   DVT prophylaxis: On Xarelto Code Status: Full code Family Communication: Care discussed with patient Disposition Plan:  Status is: Inpatient  Remains inpatient appropriate because:Hemodynamically unstable  Dispo: The patient is from: Home              Anticipated d/c is to: Home              Patient currently is not medically stable to d/c.   Difficult to place patient No        Consultants:  None  Procedures:  none  Antimicrobials:    Subjective: He report 3 near syncope episode yesterday while he was working with a physical therapist. Blood pressure was as low as 80s   Objective: Vitals:   12/11/20 1145 12/11/20 1215 12/11/20 1322 12/11/20 1600  BP: (!) 105/57 (!) 101/55 121/61 117/70  Pulse: 99 96 99 96  Resp: 14 14 18 16   Temp:   98.1 F (36.7 C) 98 F (36.7 C)  TempSrc:   Oral Oral  SpO2: (!) 88% 93% 92% 94%  Weight:      Height:        Intake/Output Summary (Last 24 hours) at 12/11/2020 1639 Last data filed at 12/11/2020 1606 Gross per 24 hour  Intake 3510.63 ml  Output 2500 ml  Net 1010.63 ml   Filed Weights   12/10/20 2037  Weight: (!) 217.7 kg  Examination:  General exam: Appears calm and comfortable  Respiratory system: Clear to auscultation. Respiratory effort normal. Cardiovascular system: S1 & S2 heard, RRR.  Gastrointestinal system: Abdomen is nondistended, soft and nontender. No organomegaly or masses felt. Normal bowel sounds heard. Central nervous system: Alert and oriented. Extremities: Symmetric 5 x 5 power.   Data Reviewed: I have personally reviewed following labs and imaging studies  CBC: Recent Labs  Lab 12/10/20 1725 12/10/20 1821 12/11/20 0351  WBC 9.8  --  8.5  NEUTROABS 6.5  --   --   HGB 12.2* 11.9* 11.0*  HCT 39.7 35.0* 35.5*  MCV 81.0  --  80.5   PLT 231  --  186   Basic Metabolic Panel: Recent Labs  Lab 12/10/20 1725 12/10/20 1821 12/11/20 0351  NA 135 137 136  K 4.5 4.5 3.9  CL 103 104 104  CO2 21*  --  24  GLUCOSE 144* 129* 108*  BUN 20 21* 18  CREATININE 1.53* 1.60* 1.24  CALCIUM 8.8*  --  8.5*   GFR: Estimated Creatinine Clearance: 144.6 mL/min (by C-G formula based on SCr of 1.24 mg/dL). Liver Function Tests: Recent Labs  Lab 12/10/20 1725 12/11/20 0351  AST 23 20  ALT 18 17  ALKPHOS 68 57  BILITOT 0.7 0.8  PROT 7.0 6.1*  ALBUMIN 3.0* 2.6*   No results for input(s): LIPASE, AMYLASE in the last 168 hours. No results for input(s): AMMONIA in the last 168 hours. Coagulation Profile: Recent Labs  Lab 12/10/20 1725 12/11/20 0351  INR 1.3* 1.2   Cardiac Enzymes: No results for input(s): CKTOTAL, CKMB, CKMBINDEX, TROPONINI in the last 168 hours. BNP (last 3 results) No results for input(s): PROBNP in the last 8760 hours. HbA1C: Recent Labs    12/10/20 2209  HGBA1C 7.1*   CBG: Recent Labs  Lab 12/11/20 0126 12/11/20 0343 12/11/20 0752 12/11/20 1329 12/11/20 1637  GLUCAP 106* 115* 140* 158* 167*   Lipid Profile: No results for input(s): CHOL, HDL, LDLCALC, TRIG, CHOLHDL, LDLDIRECT in the last 72 hours. Thyroid Function Tests: No results for input(s): TSH, T4TOTAL, FREET4, T3FREE, THYROIDAB in the last 72 hours. Anemia Panel: No results for input(s): VITAMINB12, FOLATE, FERRITIN, TIBC, IRON, RETICCTPCT in the last 72 hours. Sepsis Labs: Recent Labs  Lab 12/10/20 1725 12/10/20 1925 12/10/20 2200 12/11/20 0100 12/11/20 0351  PROCALCITON  --   --   --   --  <0.10  LATICACIDVEN 3.5* 2.2* 1.3 2.1*  --     Recent Results (from the past 240 hour(s))  Resp Panel by RT-PCR (Flu A&B, Covid) Nasopharyngeal Swab     Status: None   Collection Time: 12/10/20  5:25 PM   Specimen: Nasopharyngeal Swab; Nasopharyngeal(NP) swabs in vial transport medium  Result Value Ref Range Status   SARS  Coronavirus 2 by RT PCR NEGATIVE NEGATIVE Final    Comment: (NOTE) SARS-CoV-2 target nucleic acids are NOT DETECTED.  The SARS-CoV-2 RNA is generally detectable in upper respiratory specimens during the acute phase of infection. The lowest concentration of SARS-CoV-2 viral copies this assay can detect is 138 copies/mL. A negative result does not preclude SARS-Cov-2 infection and should not be used as the sole basis for treatment or other patient management decisions. A negative result may occur with  improper specimen collection/handling, submission of specimen other than nasopharyngeal swab, presence of viral mutation(s) within the areas targeted by this assay, and inadequate number of viral copies(<138 copies/mL). A negative result must be combined with clinical  observations, patient history, and epidemiological information. The expected result is Negative.  Fact Sheet for Patients:  BloggerCourse.com  Fact Sheet for Healthcare Providers:  SeriousBroker.it  This test is no t yet approved or cleared by the [place] FDA and  has been authorized for detection and/or diagnosis of SARS-CoV-2 by FDA under an Emergency Use Authorization (EUA). This EUA will remain  in effect (meaning this test can be used) for the duration of the COVID-19 declaration under Section 564(b)(1) of the Act, 21 U.S.C.section 360bbb-3(b)(1), unless the authorization is terminated  or revoked sooner.       Influenza A by PCR NEGATIVE NEGATIVE Final   Influenza B by PCR NEGATIVE NEGATIVE Final    Comment: (NOTE) The Xpert Xpress SARS-CoV-2/FLU/RSV plus assay is intended as an aid in the diagnosis of influenza from Nasopharyngeal swab specimens and should not be used as a sole basis for treatment. Nasal washings and aspirates are unacceptable for Xpert Xpress SARS-CoV-2/FLU/RSV testing.  Fact Sheet for  Patients: BloggerCourse.com  Fact Sheet for Healthcare Providers: SeriousBroker.it  This test is not yet approved or cleared by the [place] FDA and has been authorized for detection and/or diagnosis of SARS-CoV-2 by FDA under an Emergency Use Authorization (EUA). This EUA will remain in effect (meaning this test can be used) for the duration of the COVID-19 declaration under Section 564(b)(1) of the Act, 21 U.S.C. section 360bbb-3(b)(1), unless the authorization is terminated or revoked.  Performed at [facility], [address], [place], [place] [address]   Blood Culture (routine x 2)     Status: None (Preliminary result)   Collection Time: 12/10/20  6:00 PM   Specimen: BLOOD RIGHT ARM  Result Value Ref Range Status   Specimen Description BLOOD RIGHT ARM  Final   Special Requests   Final    BOTTLES DRAWN AEROBIC AND ANAEROBIC Blood Culture results may not be optimal due to an inadequate volume of blood received in culture bottles   Culture   Final    NO GROWTH < 12 HOURS Performed at [facility], [address], [place], [place] [address]    Report Status PENDING  Incomplete         Radiology Studies: CT ABDOMEN PELVIS WO CONTRAST  Result Date: 12/11/2020 CLINICAL DATA:  Sepsis.  History of sacral decubitus ulcer EXAM: CT ABDOMEN AND PELVIS WITHOUT CONTRAST TECHNIQUE: Multidetector CT imaging of the abdomen and pelvis was performed following the standard protocol without IV contrast. COMPARISON:  08/02/2020 FINDINGS: LOWER CHEST: Normal. HEPATOBILIARY: Normal hepatic contours. No intra- or extrahepatic biliary dilatation. The gallbladder is normal. PANCREAS: Normal pancreas. No ductal dilatation or peripancreatic fluid collection. SPLEEN: Normal. ADRENALS/URINARY TRACT: The adrenal glands are normal. No hydronephrosis, nephroureterolithiasis or solid renal mass. The urinary bladder is normal for degree of  distention STOMACH/BOWEL: Sleeve gastrectomy. No small bowel dilatation or inflammation. No focal colonic abnormality. Normal appendix. VASCULAR/LYMPHATIC: Normal course and caliber of the major abdominal vessels. No abdominal or pelvic lymphadenopathy. REPRODUCTIVE: Normal prostate size with symmetric seminal vesicles. MUSCULOSKELETAL. There is soft tissue thickening at the upper left gluteal cleft. No fluid collection. OTHER: None. IMPRESSION: 1. No acute abnormality of the abdomen or pelvis. 2. Soft tissue thickening at the upper left gluteal cleft without fluid collection. Electronically Signed   By: Deatra Robinson M.D.   On: 12/11/2020 02:42   DG Chest Port 1 View  Result Date: 12/10/2020 CLINICAL DATA:  Possible sepsis EXAM: PORTABLE CHEST 1 VIEW COMPARISON:  07/01/2020 FINDINGS: Cardiac shadow is stable. The lungs are hypoinflated. Right basilar atelectatic changes are noted. No focal confluent infiltrate is seen. No bony abnormality is noted. IMPRESSION: Mild right basilar atelectasis. Electronically Signed   By: Alcide Clever M.D.   On: 12/10/2020 19:16        Scheduled Meds:  amiodarone  200 mg Oral Daily   atorvastatin  20 mg Oral QPM   Chlorhexidine Gluconate Cloth  6 each Topical Daily   docusate sodium  100 mg Oral Daily   ezetimibe  10 mg Oral Daily   hydrocortisone  10 mg Oral Daily   hydrocortisone  5 mg Oral Daily   hydrocortisone  5 mg Oral Daily   insulin aspart  0-15 Units Subcutaneous TID WC   insulin aspart  10 Units Subcutaneous TID WC   insulin detemir  40 Units Subcutaneous BID   levothyroxine  75 mcg Oral Q0600   nortriptyline  50 mg Oral BID   pantoprazole  40 mg Oral BID   polycarbophil  625 mg Oral Daily   pregabalin  300 mg Oral BID   rivaroxaban  20 mg Oral Q supper   saccharomyces boulardii  250 mg Oral Daily   senna-docusate  1 tablet Oral BID   tamsulosin  0.4 mg Oral Daily   zinc sulfate  220 mg Oral Daily   Continuous Infusions:  ceFEPime  (MAXIPIME) IV Stopped (12/11/20 1216)   lactated ringers 100 mL/hr at 12/11/20 0919   linezolid (ZYVOX) IV     metronidazole Stopped (12/11/20 1024)     LOS: 1 day    Time spent: 35 minutes    Jhonathan Desroches A Fishel Wamble, MD [facility]   If 7PM-7AM, please contact night-coverage [email or web address]  12/11/2020, 4:39 PM

## 2020-12-11 NOTE — ED Notes (Signed)
Carl Hill mother [phone]  would like an update

## 2020-12-11 NOTE — Progress Notes (Signed)
Pt with acute urinary retention now: 2.2L out after I+O cath.  1) Putting in for foley 2) putting in for diet order since CT AP was unremarkable 3) increasing levemir dose to 30u BID with AM dose 4) 6u mealtime novolog 5) changing SSI to mod scale AC instead of Q4H

## 2020-12-11 NOTE — Consult Note (Addendum)
WOC Nurse Consult Note: Patient receiving care in [facility] [phone] Former Fournier's Gangrene that is healing. Reason for Consult: Sacral wound Wound type: Healing surgical wound that patient states evolved on 06/10/20 and is now healing from former fournier's gangrene, now being followed by [facility].  Pressure Injury POA: NA Measurement: 12 x 6 x 0 Wound bed: Pink moist and clean Drainage (amount, consistency, odor) Serosanguinous on dressing that was removed.  Periwound: Intact Dressing procedure/placement/frequency: Clean the sacral area with warm water, irrigate with NS, dry and apply a saline moistened gauze, dry gauze and secure with ABD pad and Medipore tape. Change twice daily.  Monitor the wound area(s) for worsening of condition such as: Signs/symptoms of infection, increase in size, development of or worsening of odor, development of pain, or increased pain at the affected locations.   Notify the medical team if any of these develop.  Thank you for the consult. WOC nurse will not follow at this time.   Please re-consult the WOC team if needed.  Renaldo Reel Katrinka Blazing, MSN, RN, CMSRN, Angus Seller, [facility] Wound Treatment Associate Pager [phone]

## 2020-12-12 LAB — BASIC METABOLIC PANEL
Anion gap: 5 (ref 5–15)
BUN: 11 mg/dL (ref 6–20)
CO2: 29 mmol/L (ref 22–32)
Calcium: 8.5 mg/dL — ABNORMAL LOW (ref 8.9–10.3)
Chloride: 105 mmol/L (ref 98–111)
Creatinine, Ser: 1.09 mg/dL (ref 0.61–1.24)
GFR, Estimated: >60 mL/min (ref >60)
Glucose, Bld: 140 mg/dL — ABNORMAL HIGH (ref 70–99)
Potassium: 4.3 mmol/L (ref 3.5–5.1)
Sodium: 139 mmol/L (ref 135–145)

## 2020-12-12 LAB — GLUCOSE, CAPILLARY
Glucose-Capillary: 110 mg/dL — ABNORMAL HIGH (ref 70–99)
Glucose-Capillary: 142 mg/dL — ABNORMAL HIGH (ref 70–99)
Glucose-Capillary: 115 mg/dL — ABNORMAL HIGH (ref 70–99)
Glucose-Capillary: 128 mg/dL — ABNORMAL HIGH (ref 70–99)

## 2020-12-12 LAB — CBC
HCT: 37.2 % — ABNORMAL LOW (ref 39.0–52.0)
Hemoglobin: 11.6 g/dL — ABNORMAL LOW (ref 13.0–17.0)
MCH: 25.2 pg — ABNORMAL LOW (ref 26.0–34.0)
MCHC: 31.2 g/dL (ref 30.0–36.0)
MCV: 80.9 fL (ref 80.0–100.0)
Platelets: 181 10*3/uL (ref 150–400)
RBC: 4.6 MIL/uL (ref 4.22–5.81)
RDW: 18.4 % — ABNORMAL HIGH (ref 11.5–15.5)
WBC: 8.2 10*3/uL (ref 4.0–10.5)
nRBC: 0 % (ref 0.0–0.2)

## 2020-12-12 LAB — URINE CULTURE
Culture: NO GROWTH
Culture: NO GROWTH

## 2020-12-12 LAB — ACTH: C206 ACTH: 70.6 pg/mL — ABNORMAL HIGH (ref 7.2–63.3)

## 2020-12-12 LAB — MAGNESIUM: Magnesium: 2 mg/dL (ref 1.7–2.4)

## 2020-12-12 MED ORDER — METRONIDAZOLE 500 MG PO TABS
500.0000 mg | ORAL_TABLET | Freq: Two times a day (BID) | ORAL | Status: DC
  Administered 2020-12-12: 500 mg via ORAL
  Filled 2020-12-12: qty 1

## 2020-12-12 MED ORDER — LINEZOLID 600 MG PO TABS
600.0000 mg | ORAL_TABLET | Freq: Two times a day (BID) | ORAL | Status: DC
  Filled 2020-12-12: qty 1

## 2020-12-12 MED ORDER — HYDROCORTISONE 5 MG PO TABS
15.0000 mg | ORAL_TABLET | Freq: Every day | ORAL | Status: DC
  Administered 2020-12-13 – 2020-12-16 (×4): 15 mg via ORAL
  Filled 2020-12-12 (×5): qty 1

## 2020-12-12 NOTE — Progress Notes (Signed)
PROGRESS NOTE    Carl Hill  DHR:[id number] DOB: 07-30-1970 DOA: 12/10/2020 PCP: Malka So., MD   Brief Narrative: 50 year old with past medical history significant for diabetes, hypertension, morbid obesity, A. fib on amiodarone and Xarelto, history of Fournier gangrene and sepsis earlier this year, and had a prolonged hospital course, history of sacral decubitus,  this has been healing.  Patient presents to the ED complaining of 3 near syncope episode occurred the afternoon of admission. On evaluation heart rate was 110 and systolic blood pressure was in the 80s.  In the ED that he had a temperature of 101.  Blood pressure in the ED as low as 69.    Assessment & Plan:   Active Problems:   AKI (acute kidney injury) (HCC)   Atrial fibrillation, chronic (HCC)   Essential hypertension   Chronic pain disorder   Morbid obesity with BMI of 60.0-69.9, adult (HCC)   DM2 (diabetes mellitus, type 2) (HCC)  1-SIRS: -Patient presents with fever, tachycardia tachypnea. -No source for infection identified so far. -Blood cultures; no growth in the last 12 hours. -Urine culture; no growth.  -Chest x-ray bilateral atelectasis.  Patient denies cough -CT abdomen and pelvis: No acute abnormality of the abdomen or pelvis.  Soft tissue thickening at the upper left gluteal cleft without fluid collection. -Will Discontinue Flagyl, Linezolid. Continue with Cefepime.   2-Hypotension: Suspect this is related to adrenal insufficiency.  Cortisol was low at 2.9. Patient was started on hydrocortisone .  Will arrange follow-up with endocrinologist for cosynthroipin test.  Still mildly orthostatic today. Plan to increase hydrocortisone morning dose.   3-AKI: Continue with IV fluids Hold lisinopril  Hypertension: Continue to hold lisinopril  A. fib: Continue with Xarelto and amiodarone.  Hold Cardizem due to hypotension  Diabetes type 2: We will increase Levemir to 40 units twice daily and  increase meal coverage to 10 units 3 times daily  Chronic pain; continue with lyrica and pamelor.     Estimated body mass index is 55.47 kg/m as calculated from the following:   Height as of this encounter: 6\' 6"  (1.981 m).   Weight as of this encounter: 217.7 kg.   DVT prophylaxis: On Xarelto Code Status: Full code Family Communication: Care discussed with patient Disposition Plan:  Status is: Inpatient  Remains inpatient appropriate because:Hemodynamically unstable  Dispo: The patient is from: Home              Anticipated d/c is to: Home              Patient currently is not medically stable to d/c.   Difficult to place patient No        Consultants:  None  Procedures:  none  Antimicrobials:    Subjective: He prefer to keep foley for another day.  No new complaints.    Objective: Vitals:   12/12/20 0000 12/12/20 0354 12/12/20 0726 12/12/20 1146  BP: 116/70 140/71 118/66 122/72  Pulse: 97 92 91   Resp: 15  19 19   Temp: 98.6 F (37 C) 97.8 F (36.6 C) (!) 97.5 F (36.4 C) 98.1 F (36.7 C)  TempSrc: Oral Oral Oral Oral  SpO2: 91% 94% 93%   Weight:      Height:        Intake/Output Summary (Last 24 hours) at 12/12/2020 1523 Last data filed at 12/12/2020 1510 Gross per 24 hour  Intake 3254.85 ml  Output 5100 ml  Net -1845.15 ml    12/14/2020  Weights   12/10/20 2037  Weight: (!) 217.7 kg    Examination:  General exam: NAD Respiratory system: CTA Cardiovascular system: S 1, S 2 RRR Gastrointestinal system: BS present, soft, nt Central nervous system: Alert, and oriented.  Extremities: no edema   Data Reviewed: I have personally reviewed following labs and imaging studies  CBC: Recent Labs  Lab 12/10/20 1725 12/10/20 1821 12/11/20 0351 12/12/20 0823  WBC 9.8  --  8.5 8.2  NEUTROABS 6.5  --   --   --   HGB 12.2* 11.9* 11.0* 11.6*  HCT 39.7 35.0* 35.5* 37.2*  MCV 81.0  --  80.5 80.9  PLT 231  --  186 181    Basic Metabolic  Panel: Recent Labs  Lab 12/10/20 1725 12/10/20 1821 12/11/20 0351 12/12/20 0823  NA 135 137 136 139  K 4.5 4.5 3.9 4.3  CL 103 104 104 105  CO2 21*  --  24 29  GLUCOSE 144* 129* 108* 140*  BUN 20 21* 18 11  CREATININE 1.53* 1.60* 1.24 1.09  CALCIUM 8.8*  --  8.5* 8.5*  MG  --   --   --  2.0    GFR: Estimated Creatinine Clearance: 164.5 mL/min (by C-G formula based on SCr of 1.09 mg/dL). Liver Function Tests: Recent Labs  Lab 12/10/20 1725 12/11/20 0351  AST 23 20  ALT 18 17  ALKPHOS 68 57  BILITOT 0.7 0.8  PROT 7.0 6.1*  ALBUMIN 3.0* 2.6*    No results for input(s): LIPASE, AMYLASE in the last 168 hours. No results for input(s): AMMONIA in the last 168 hours. Coagulation Profile: Recent Labs  Lab 12/10/20 1725 12/11/20 0351  INR 1.3* 1.2    Cardiac Enzymes: No results for input(s): CKTOTAL, CKMB, CKMBINDEX, TROPONINI in the last 168 hours. BNP (last 3 results) No results for input(s): PROBNP in the last 8760 hours. HbA1C: Recent Labs    12/10/20 2209  HGBA1C 7.1*    CBG: Recent Labs  Lab 12/11/20 0752 12/11/20 1329 12/11/20 1637 12/12/20 0733 12/12/20 1149  GLUCAP 140* 158* 167* 110* 142*    Lipid Profile: No results for input(s): CHOL, HDL, LDLCALC, TRIG, CHOLHDL, LDLDIRECT in the last 72 hours. Thyroid Function Tests: No results for input(s): TSH, T4TOTAL, FREET4, T3FREE, THYROIDAB in the last 72 hours. Anemia Panel: No results for input(s): VITAMINB12, FOLATE, FERRITIN, TIBC, IRON, RETICCTPCT in the last 72 hours. Sepsis Labs: Recent Labs  Lab 12/10/20 1725 12/10/20 1925 12/10/20 2200 12/11/20 0100 12/11/20 0351  PROCALCITON  --   --   --   --  <0.10  LATICACIDVEN 3.5* 2.2* 1.3 2.1*  --      Recent Results (from the past 240 hour(s))  Resp Panel by RT-PCR (Flu A&B, Covid) Nasopharyngeal Swab     Status: None   Collection Time: 12/10/20  5:25 PM   Specimen: Nasopharyngeal Swab; Nasopharyngeal(NP) swabs in vial transport medium   Result Value Ref Range Status   SARS Coronavirus 2 by RT PCR NEGATIVE NEGATIVE Final    Comment: (NOTE) SARS-CoV-2 target nucleic acids are NOT DETECTED.  The SARS-CoV-2 RNA is generally detectable in upper respiratory specimens during the acute phase of infection. The lowest concentration of SARS-CoV-2 viral copies this assay can detect is 138 copies/mL. A negative result does not preclude SARS-Cov-2 infection and should not be used as the sole basis for treatment or other patient management decisions. A negative result may occur with  improper specimen collection/handling, submission of specimen other  than nasopharyngeal swab, presence of viral mutation(s) within the areas targeted by this assay, and inadequate number of viral copies(<138 copies/mL). A negative result must be combined with clinical observations, patient history, and epidemiological information. The expected result is Negative.  Fact Sheet for Patients:  BloggerCourse.com  Fact Sheet for Healthcare Providers:  SeriousBroker.it  This test is no t yet approved or cleared by the [place] FDA and  has been authorized for detection and/or diagnosis of SARS-CoV-2 by FDA under an Emergency Use Authorization (EUA). This EUA will remain  in effect (meaning this test can be used) for the duration of the COVID-19 declaration under Section 564(b)(1) of the Act, 21 U.S.C.section 360bbb-3(b)(1), unless the authorization is terminated  or revoked sooner.       Influenza A by PCR NEGATIVE NEGATIVE Final   Influenza B by PCR NEGATIVE NEGATIVE Final    Comment: (NOTE) The Xpert Xpress SARS-CoV-2/FLU/RSV plus assay is intended as an aid in the diagnosis of influenza from Nasopharyngeal swab specimens and should not be used as a sole basis for treatment. Nasal washings and aspirates are unacceptable for Xpert Xpress SARS-CoV-2/FLU/RSV testing.  Fact Sheet for  Patients: BloggerCourse.com  Fact Sheet for Healthcare Providers: SeriousBroker.it  This test is not yet approved or cleared by the [place] FDA and has been authorized for detection and/or diagnosis of SARS-CoV-2 by FDA under an Emergency Use Authorization (EUA). This EUA will remain in effect (meaning this test can be used) for the duration of the COVID-19 declaration under Section 564(b)(1) of the Act, 21 U.S.C. section 360bbb-3(b)(1), unless the authorization is terminated or revoked.  Performed at [facility], [address] [place], [place] [address]   Blood Culture (routine x 2)     Status: None (Preliminary result)   Collection Time: 12/10/20  6:00 PM   Specimen: BLOOD RIGHT ARM  Result Value Ref Range Status   Specimen Description BLOOD RIGHT ARM  Final   Special Requests   Final    BOTTLES DRAWN AEROBIC AND ANAEROBIC Blood Culture results may not be optimal due to an inadequate volume of blood received in culture bottles   Culture   Final    NO GROWTH 2 DAYS Performed at [facility], [address], [place], [place] [address]    Report Status PENDING  Incomplete  Urine culture     Status: None   Collection Time: 12/10/20  7:55 PM   Specimen: In/Out Cath Urine  Result Value Ref Range Status   Specimen Description IN/OUT CATH URINE  Final   Special Requests NONE  Final   Culture   Final    NO GROWTH Performed at [facility], [address] [place], [place] [address]    Report Status 12/12/2020 FINAL  Final  Urine Culture     Status: None   Collection Time: 12/11/20  8:16 AM   Specimen: Urine, Random  Result Value Ref Range Status   Specimen Description URINE, RANDOM  Final   Special Requests NONE  Final   Culture   Final    NO GROWTH Performed at [facility], [address] [place], [place] [address]    Report Status 12/12/2020 FINAL  Final          Radiology  Studies: CT ABDOMEN PELVIS WO CONTRAST  Result Date: 12/11/2020 CLINICAL DATA:  Sepsis.  History of sacral decubitus ulcer EXAM: CT ABDOMEN AND PELVIS WITHOUT CONTRAST TECHNIQUE: Multidetector CT imaging of  the abdomen and pelvis was performed following the standard protocol without IV contrast. COMPARISON:  08/02/2020 FINDINGS: LOWER CHEST: Normal. HEPATOBILIARY: Normal hepatic contours. No intra- or extrahepatic biliary dilatation. The gallbladder is normal. PANCREAS: Normal pancreas. No ductal dilatation or peripancreatic fluid collection. SPLEEN: Normal. ADRENALS/URINARY TRACT: The adrenal glands are normal. No hydronephrosis, nephroureterolithiasis or solid renal mass. The urinary bladder is normal for degree of distention STOMACH/BOWEL: Sleeve gastrectomy. No small bowel dilatation or inflammation. No focal colonic abnormality. Normal appendix. VASCULAR/LYMPHATIC: Normal course and caliber of the major abdominal vessels. No abdominal or pelvic lymphadenopathy. REPRODUCTIVE: Normal prostate size with symmetric seminal vesicles. MUSCULOSKELETAL. There is soft tissue thickening at the upper left gluteal cleft. No fluid collection. OTHER: None. IMPRESSION: 1. No acute abnormality of the abdomen or pelvis. 2. Soft tissue thickening at the upper left gluteal cleft without fluid collection. Electronically Signed   By: Deatra Robinson M.D.   On: 12/11/2020 02:42   DG Chest Port 1 View  Result Date: 12/10/2020 CLINICAL DATA:  Possible sepsis EXAM: PORTABLE CHEST 1 VIEW COMPARISON:  07/01/2020 FINDINGS: Cardiac shadow is stable. The lungs are hypoinflated. Right basilar atelectatic changes are noted. No focal confluent infiltrate is seen. No bony abnormality is noted. IMPRESSION: Mild right basilar atelectasis. Electronically Signed   By: Alcide Clever M.D.   On: 12/10/2020 19:16        Scheduled Meds:  amiodarone  200 mg Oral Daily   atorvastatin  20 mg Oral QPM   Chlorhexidine Gluconate Cloth  6 each  Topical Daily   docusate sodium  100 mg Oral Daily   ezetimibe  10 mg Oral Daily   [START ON 12/13/2020] hydrocortisone  15 mg Oral Daily   hydrocortisone  5 mg Oral Daily   hydrocortisone  5 mg Oral Daily   insulin aspart  0-15 Units Subcutaneous TID WC   insulin aspart  10 Units Subcutaneous TID WC   insulin detemir  40 Units Subcutaneous BID   levothyroxine  75 mcg Oral Q0600   linezolid  600 mg Oral Q12H   metroNIDAZOLE  500 mg Oral Q12H   nortriptyline  50 mg Oral BID   pantoprazole  40 mg Oral BID   polycarbophil  625 mg Oral Daily   pregabalin  300 mg Oral BID   rivaroxaban  20 mg Oral Q supper   saccharomyces boulardii  250 mg Oral Daily   senna-docusate  1 tablet Oral BID   tamsulosin  0.4 mg Oral Daily   zinc sulfate  220 mg Oral Daily   Continuous Infusions:  ceFEPime (MAXIPIME) IV 2 g (12/12/20 1230)   lactated ringers 100 mL/hr at 12/12/20 0807     LOS: 2 days    Time spent: 35 minutes    Terren Haberle A Shadia Larose, MD [facility]   If 7PM-7AM, please contact night-coverage [email or web address]  12/12/2020, 3:23 PM

## 2020-12-12 NOTE — Evaluation (Signed)
Physical Therapy Evaluation Patient Details Name: Carl Hill MRN: [id number] DOB: 12-26-1970 Today's Date: 12/12/2020   History of Present Illness  50yo male admitted 12/10/20 with near syncope. Admitted with sepsis. PMH A-fib, DM, HTN, abdominal surgery, I&D perineal wound, R heel wound, gastric sleeve resction, obesity  Clinical Impression   Patient received in bed, very pleasant but extremely talkative and requires frequent redirection to task at hand. Slightly impulsive but question if this is his baseline. Able to mobilize on a min guard-MinA basis but does fatigue quickly with HR elevation to 120 and Spo2 desat to as low as 88%. Recovers well with cues for PLB. Fortunately does not appear to be too far from his baseline. Left in bed with all needs met, mother present. Will benefit from return to skilled HHPT f/u at DC.  Orthostatic Blood Pressure supine 122/3mHG HR 93 sitting EOB 96/680mg HR 103 SpO2 88-92% RA standingBP 101/7144m HR 120bpm SpO2 95% RA      Follow Up Recommendations Home health PT;Supervision for mobility/OOB    Equipment Recommendations  Wheelchair (measurements PT);Wheelchair cushion (measurements PT);3in1 (PT) (bariatric)    Recommendations for Other Services       Precautions / Restrictions Precautions Precautions: Fall;Other (comment) Precaution Comments: perineal wound, peripheral neuropathy, obesity Restrictions Weight Bearing Restrictions: No      Mobility  Bed Mobility Overal bed mobility: Needs Assistance Bed Mobility: Supine to Sit;Sit to Supine     Supine to sit: Supervision Sit to supine: Supervision   General bed mobility comments: supervision for safety, pt reliant on significant amount of momentum and heavy reliance on bed rails    Transfers Overall transfer level: Needs assistance Equipment used: Rolling walker (2 wheeled) Transfers: Sit to/from Stand Sit to Stand: Min assist         General transfer comment:  minA to stand from EOB, minguard for lateral side stepping along EOB in preparation for further mobility progression  Ambulation/Gait             General Gait Details: deferred- fatigue and HR elevation, O2 desat to 87% with low level activities  Stairs            Wheelchair Mobility    Modified Rankin (Stroke Patients Only)       Balance Overall balance assessment: Needs assistance Sitting-balance support: No upper extremity supported;Feet supported Sitting balance-Leahy Scale: Fair     Standing balance support: During functional activity;Single extremity supported;Bilateral upper extremity supported Standing balance-Leahy Scale: Poor Standing balance comment: pt demonstrated ability to static stand for ~10seconds without use of UE support. Pt demonstrated increased stability with at least single UE support. minguard+2 for safety. BUE support required for weight shifting                             Pertinent Vitals/Pain Pain Assessment: No/denies pain    Home Living Family/patient expects to be discharged to:: Private residence Living Arrangements: Parent Available Help at Discharge: Family;Available 24 hours/day Type of Home: House Home Access: Stairs to enter     Home Layout: One level Home Equipment: [employer] consultant2 wheels Additional Comments: Had a shower stool, hand held shower    Prior Function Level of Independence: Needs assistance   Gait / Transfers Assistance Needed: Pt reports he has been ambulating at RW/rollator level, reports he was able to tolerate standing/walking for 4mi48mes before needed about 5min74mated recovery. Pt was working with HHPT and working on  standing balance, endurance and stability. Pt reports he would walk down his 8stairs and down driveway with his therapist. Pt has a seat on his front porch that he would utilize for rest breaks  ADL's / Homemaking Assistance Needed: pt report he was independt with dressing with AE and  sponge bathing, his mom would assist him prn.  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 Pt reports he "misjudged" the distance of his bench and when he fell hit his head on his mom's knee scooter, pt proceeded to show therapists pictures of the wound, did not appear to fully grasp significance of event     Hand Dominance   Dominant Hand: Right    Extremity/Trunk Assessment   Upper Extremity Assessment Upper Extremity Assessment: Defer to OT evaluation    Lower Extremity Assessment Lower Extremity Assessment:  (hx of neuropathy in BLEs, numb from knees down)    Cervical / Trunk Assessment Cervical / Trunk Assessment: Other exceptions Cervical / Trunk Exceptions: large body habitus  Communication   Communication: No difficulties  Cognition Arousal/Alertness: Awake/alert Behavior During Therapy: WFL for tasks assessed/performed Overall Cognitive Status: Within Functional Limits for tasks assessed                                 General Comments: Pt very talkative during session and requires redirection to topic being discussed. Pt appears to have decreased safety awareness and decreased awareness of deficits/need for increased assistance. Pt discussing falls and wounds with therapists, pt appeared to have decreased awareness of significance of head injury and wounds. Educated pt on importance of fall prevention and maintaining skin integrity.      General Comments General comments (skin integrity, edema, etc.): SPO2 87-95% on RA, education provided and redirection provided for PLB with good sat recovery    Exercises     Assessment/Plan    PT Assessment Patient needs continued PT services  PT Problem List Decreased strength;Decreased cognition;Decreased knowledge of use of DME;Obesity;Decreased activity tolerance;Decreased safety  awareness;Decreased balance;Decreased mobility;Decreased coordination       PT Treatment Interventions DME instruction;Balance training;Gait training;Stair training;Cognitive remediation;Functional mobility training;Patient/family education;Therapeutic activities;Therapeutic exercise    PT Goals (Current goals can be found in the Care Plan section)  Acute Rehab PT Goals Patient Stated Goal: to continue to progress mobiltiy;be able to walk without heavy reliance on RW PT Goal Formulation: With patient/family Time For Goal Achievement: 12/26/20 Potential to Achieve Goals: Fair    Frequency Min 3X/week   Barriers to discharge        Co-evaluation   Reason for Co-Treatment: Complexity of the patient's impairments (multi-system involvement);For patient/therapist safety;To address functional/ADL transfers   OT goals addressed during session: ADL's and self-care       AM-PAC PT "6 Clicks" Mobility  Outcome Measure Help needed turning from your back to your side while in a flat bed without using bedrails?: None Help needed moving from lying on your back to sitting on the side of a flat bed without using bedrails?: None Help needed moving to and from a bed to a chair (including a wheelchair)?: A Little Help needed standing up from a chair using your arms (e.g., wheelchair or bedside chair)?: A Little Help needed to walk in hospital room?: A Lot Help needed climbing 3-5 steps with a  railing? : A Lot 6 Click Score: 18    End of Session Equipment Utilized During Treatment: Gait belt Activity Tolerance: Patient tolerated treatment well Patient left: in bed;with call bell/phone within reach;with family/visitor present Nurse Communication: Mobility status PT Visit Diagnosis: Muscle weakness (generalized) (M62.81);Unsteadiness on feet (R26.81);Difficulty in walking, not elsewhere classified (R26.2);History of falling (Z91.81)    Time: 8867-7373 PT Time Calculation (min) (ACUTE ONLY): 46  min   Charges:   PT Evaluation $PT Eval Moderate Complexity: 1 Mod (medicaid eval)         Windell Norfolk, DPT, PN1   Supplemental Physical Therapist [place]    Pager [phone] Acute Rehab Office [phone]

## 2020-12-12 NOTE — Progress Notes (Signed)
PHARMACIST - PHYSICIAN COMMUNICATION DR:   Sunnie Nielsen CONCERNING: Antibiotic IV to Oral Route Change Policy  RECOMMENDATION: This patient is receiving linezolid/metronidazole by the intravenous route.  Based on criteria approved by the Pharmacy and Therapeutics Committee, the antibiotic(s) is/are being converted to the equivalent oral dose form(s).   DESCRIPTION: These criteria include: Patient being treated for a respiratory tract infection, urinary tract infection, cellulitis or clostridium difficile associated diarrhea if on metronidazole The patient is not neutropenic and does not exhibit a GI malabsorption state The patient is eating (either orally or via tube) and/or has been taking other orally administered medications for a least 24 hours The patient is improving clinically and has a Tmax < 100.5  If you have questions about this conversion, please contact the Pharmacy Department  []   [phone] )  [phone] []   [phone] )  [facility] [x]   [phone] )  [facility] []   [phone] )  [facility] []   [phone] )  [facility]

## 2020-12-12 NOTE — Evaluation (Addendum)
Occupational Therapy Evaluation Patient Details Name: Carl Hill MRN: [id number] DOB: 01/30/1971 Today's Date: 12/12/2020    History of Present Illness 50yo male admitted 12/10/20 with near syncope. Admitted with sepsis. PMH A-fib, DM, HTN, abdominal surgery, I&D perineal wound, R heel wound, gastric sleeve resction, obesity   Clinical Impression   PTA, pt was living at home with his mom, pt reports he was indpendent with ADL/IADL and his mom would assist him as needed. Pt was working with HHPT and had a Hhaide to assist with wound care. Pt reports he was able to ambulate short distances at RW level and was going up and down stairs with HHPT. Pt currently requires supervision for bed mobility, minA for sit<>stand transfer and minguard for lateral side stepping EOB. He demonstrated decreased activity tolerance, SpO2 87%-95% on RA, pt required redirection for pursed lip breathing for O2 to rebound. Pt appears close to baseline and would continue to benefit from acute OT to address established goals to facilitate safe D/C to venue listed below. At this time, recommend HHOT follow-up. Will continue to follow acutely.  Orthostatic Blood Pressure supine 122/26mmHG HR 93  sitting EOB 96/11mmHg HR 103 SpO2 88-92% RA standingBP 101/19mmHg HR 120bpm SpO2 95% RA     Follow Up Recommendations  Home health OT;Supervision - Intermittent (with mobility)    Equipment Recommendations  None recommended by OT    Recommendations for Other Services       Precautions / Restrictions Precautions Precautions: Fall;Other (comment) Precaution Comments: perineal wound, peripheral neuropathy, obesity Restrictions Weight Bearing Restrictions: No      Mobility Bed Mobility Overal bed mobility: Needs Assistance Bed Mobility: Supine to Sit;Sit to Supine     Supine to sit: Supervision Sit to supine: Supervision   General bed mobility comments: supervision for safety, pt reliant on significant amount  of momentum and heavy reliance on bed rails    Transfers Overall transfer level: Needs assistance Equipment used: Rolling walker (2 wheeled) Transfers: Sit to/from Stand Sit to Stand: Min assist         General transfer comment: minA to stand from EOB, minguard for lateral side stepping along EOB in preparation for further mobility progression    Balance Overall balance assessment: Needs assistance Sitting-balance support: No upper extremity supported;Feet supported Sitting balance-Leahy Scale: Fair     Standing balance support: During functional activity;Single extremity supported;Bilateral upper extremity supported Standing balance-Leahy Scale: Poor Standing balance comment: pt demonstrated ability to static stand for ~10seconds without use of UE support. Pt demonstrated increased stability with at least single UE support. minguard+2 for safety. BUE support required for weight shifting                           ADL either performed or assessed with clinical judgement   ADL Overall ADL's : Needs assistance/impaired Eating/Feeding: Set up;Sitting   Grooming: Set up;Sitting   Upper Body Bathing: Set up;Sitting   Lower Body Bathing: Minimal assistance;Sit to/from stand   Upper Body Dressing : Set up;Sitting   Lower Body Dressing: Minimal assistance;Sit to/from stand   Toilet Transfer: Minimal Production designer, theatre/television/film Details (indicate cue type and reason): standing at EOB and side stepping in preparation for further mobility Toileting- Clothing Manipulation and Hygiene: Minimal assistance;Sit to/from stand       Functional mobility during ADLs: Minimal assistance;+2 for safety/equipment;Rolling walker General ADL Comments: pt limited by body habitus, instability, peripheral neuropathy in BLE decreased activity tolerance and  endurance     Vision         Perception     Praxis      Pertinent Vitals/Pain Pain Assessment: No/denies pain     Hand  Dominance Right   Extremity/Trunk Assessment Upper Extremity Assessment Upper Extremity Assessment: Overall WFL for tasks assessed   Lower Extremity Assessment Lower Extremity Assessment: Defer to PT evaluation (hx of neuropathy in BLE, pt reports numbness from knees down)   Cervical / Trunk Assessment Cervical / Trunk Assessment: Other exceptions Cervical / Trunk Exceptions: large body habitus   Communication Communication Communication: No difficulties   Cognition Arousal/Alertness: Awake/alert Behavior During Therapy: WFL for tasks assessed/performed Overall Cognitive Status: Within Functional Limits for tasks assessed                                 General Comments: Pt very talkative during session and requires redirection to topic being discussed. Pt appears to have decreased safety awareness and decreased awareness of deficits/need for increased assistance. Pt discussing falls and wounds with therapists, pt appeared to have decreased awareness of significance of head injury and wounds. Educated pt on importance of fall prevention and maintaining skin integrity.   General Comments  SpO2 87%-95% RA, educated pt on pursed lip breathing and pt required frequent redirection to pursed lip breathing    Exercises     Shoulder Instructions      Home Living Family/patient expects to be discharged to:: Private residence Living Arrangements: Parent Available Help at Discharge: Family;Available 24 hours/day Type of Home: House Home Access: Stairs to enter     Home Layout: One level     Bathroom Shower/Tub: Chief Strategy Officer: Handicapped height Bathroom Accessibility: Yes   Home Equipment: Environmental consultant - 2 wheels   Additional Comments: Had a shower stool, hand held shower      Prior Functioning/Environment Level of Independence: Needs assistance  Gait / Transfers Assistance Needed: Pt reports he has been ambulating at RW/rollator level, reports he  was able to tolerate standing/walking for before needed about seated recovery. Pt was working with HHPT and working on standing balance, endurance and stability. Pt reports he would walk down his 8stairs and down driveway with his therapist. Pt has a seat on his front porch that he would utilize for rest breaks ADL's / Homemaking Assistance Needed: pt report he was independt with dressing with AE and sponge bathing, his mom would assist him prn.   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 Pt reports he "misjudged" the distance of his bench and when he fell hit his head on his mom's knee scooter, pt proceeded to show therapists pictures of the wound, did not appear to fully grasp significance of event        OT Problem List: Decreased activity tolerance;Impaired balance (sitting and/or standing);Decreased safety awareness;Cardiopulmonary status limiting activity;Decreased knowledge of use of DME or AE;Obesity      OT Treatment/Interventions: Self-care/ADL training;Therapeutic exercise;Energy conservation;DME and/or AE instruction;Therapeutic activities;Patient/family education;Balance training    OT Goals(Current goals can be found in the care plan section) Acute Rehab OT Goals Patient Stated Goal: to continue to progress mobiltiy;be able to walk without heavy reliance on RW OT Goal Formulation: With patient Time For Goal Achievement: 12/26/20 Potential to Achieve Goals: Good  ADL Goals Pt Will Perform Grooming: with modified independence;standing;sitting Pt Will Perform Lower Body Dressing: with modified independence;sit to/from stand;with adaptive equipment Pt Will Transfer to Toilet: with modified independence;ambulating Additional ADL Goal #1: Pt will demonstrate independence with 3 fall prevention strategies for safe engagment in him environment.  OT  Frequency: Min 2X/week   Barriers to D/C:            Co-evaluation PT/OT/SLP Co-Evaluation/Treatment: Yes Reason for Co-Treatment: Complexity of the patient's impairments (multi-system involvement);For patient/therapist safety;To address functional/ADL transfers   OT goals addressed during session: ADL's and self-care      AM-PAC OT "6 Clicks" Daily Activity     Outcome Measure Help from another person eating meals?: A Little Help from another person taking care of personal grooming?: A Little Help from another person toileting, which includes using toliet, bedpan, or urinal?: A Lot Help from another person bathing (including washing, rinsing, drying)?: A Little Help from another person to put on and taking off regular upper body clothing?: A Little Help from another person to put on and taking off regular lower body clothing?: A Little 6 Click Score: 17   End of Session Equipment Utilized During Treatment: Rolling walker;Oxygen Nurse Communication: Mobility status  Activity Tolerance: Patient tolerated treatment well Patient left: in bed;with call bell/phone within reach;with bed alarm set;with family/visitor present  OT Visit Diagnosis: Unsteadiness on feet (R26.81);Other abnormalities of gait and mobility (R26.89);Muscle weakness (generalized) (M62.81)                Time: 9675-9163 OT Time Calculation (min): 46 min Charges:  OT General Charges $OT Visit: 1 Visit OT Evaluation $OT Eval Moderate Complexity: 1 Mod  Keelie Zemanek OTR/L Acute Rehabilitation Services Office: [phone]   Rebeca Alert 12/12/2020, 1:15 PM

## 2020-12-13 DIAGNOSIS — I959 Hypotension, unspecified: Secondary | ICD-10-CM

## 2020-12-13 LAB — GLUCOSE, CAPILLARY
Glucose-Capillary: 114 mg/dL — ABNORMAL HIGH (ref 70–99)
Glucose-Capillary: 141 mg/dL — ABNORMAL HIGH (ref 70–99)
Glucose-Capillary: 132 mg/dL — ABNORMAL HIGH (ref 70–99)
Glucose-Capillary: 177 mg/dL — ABNORMAL HIGH (ref 70–99)

## 2020-12-13 NOTE — Progress Notes (Signed)
PROGRESS NOTE    Carl Hill  NFA:[id number] DOB: Aug 19, 1970 DOA: 12/10/2020 PCP: Malka So., MD   Brief Narrative: 50 year old with past medical history significant for diabetes, hypertension, morbid obesity, A. fib on amiodarone and Xarelto, history of Fournier gangrene and sepsis earlier this year, and had a prolonged hospital course, history of sacral decubitus,  this has been healing.  Patient presents to the ED complaining of 3 near syncope episode occurred the afternoon of admission. On evaluation heart rate was 110 and systolic blood pressure was in the 80s.  In the ED that he had a temperature of 101.  Blood pressure in the ED as low as 69.    Assessment & Plan:   Active Problems:   AKI (acute kidney injury) (HCC)   Atrial fibrillation, chronic (HCC)   Essential hypertension   Chronic pain disorder   Morbid obesity with BMI of 60.0-69.9, adult (HCC)   DM2 (diabetes mellitus, type 2) (HCC)  1-SIRS: -Patient presents with fever, tachycardia tachypnea. -No source for infection identified so far. -Blood cultures; no growth in the last 12 hours. -Urine culture; no growth.  -Chest x-ray bilateral atelectasis.  Patient denies cough -CT abdomen and pelvis: No acute abnormality of the abdomen or pelvis.  Soft tissue thickening at the upper left gluteal cleft without fluid collection. -Discontinue Flagyl, Linezolid 7/14.  Discontinue Cefepime 7/15.  -Monitor off antibiotics. Afebrile.   2-Hypotension: Suspect this is related to adrenal insufficiency.  Cortisol was low at 2.9. Patient was started on hydrocortisone .  Will arrange follow-up with endocrinologist for cosynthroipin test.  Follow orthostatic vital with PT>   3-AKI: Continue with IV fluids Hold lisinopril  Hypertension: Continue to hold lisinopril  A. fib: Continue with Xarelto and amiodarone.  Hold Cardizem due to hypotension  Diabetes type 2: We will increase Levemir to 40 units twice daily and  increase meal coverage to 10 units 3 times daily  Chronic pain; continue with lyrica and pamelor.     Estimated body mass index is 55.47 kg/m as calculated from the following:   Height as of this encounter: 6\' 6"  (1.981 m).   Weight as of this encounter: 217.7 kg.   DVT prophylaxis: On Xarelto Code Status: Full code Family Communication: Care discussed with patient Disposition Plan:  Status is: Inpatient  Remains inpatient appropriate because:Hemodynamically unstable  Dispo: The patient is from: Home              Anticipated d/c is to: Home              Patient currently is not medically stable to d/c.   Difficult to place patient No        Consultants:  None  Procedures:  none  Antimicrobials:    Subjective: He is complaining of Constipation, had BM last night but feels he need to have more BM, having cramps pain.  He doesn't think he will be able to work with PT due to cramping abdominal pain, and if he is moving his Bowel.   Objective: Vitals:   12/13/20 0009 12/13/20 0352 12/13/20 0751 12/13/20 1209  BP: (!) 111/57 106/67 133/69 125/68  Pulse: 93 93 86 90  Resp: 18 12 (!) 21 18  Temp: 97.8 F (36.6 C) 97.7 F (36.5 C) 97.6 F (36.4 C) (!) 97.3 F (36.3 C)  TempSrc: Oral Oral Oral Oral  SpO2: 93% 94% 92%   Weight:      Height:  Intake/Output Summary (Last 24 hours) at 12/13/2020 1238 Last data filed at 12/13/2020 1212 Gross per 24 hour  Intake 2132.44 ml  Output 8100 ml  Net -5967.56 ml    Filed Weights   12/10/20 2037  Weight: (!) 217.7 kg    Examination:  General exam: NAD Respiratory system: CTA Cardiovascular system: S 1, S 2  RRR Gastrointestinal system: BS present, soft, nt Central nervous system:  alert Extremities: no edema   Data Reviewed: I have personally reviewed following labs and imaging studies  CBC: Recent Labs  Lab 12/10/20 1725 12/10/20 1821 12/11/20 0351 12/12/20 0823  WBC 9.8  --  8.5 8.2   NEUTROABS 6.5  --   --   --   HGB 12.2* 11.9* 11.0* 11.6*  HCT 39.7 35.0* 35.5* 37.2*  MCV 81.0  --  80.5 80.9  PLT 231  --  186 181    Basic Metabolic Panel: Recent Labs  Lab 12/10/20 1725 12/10/20 1821 12/11/20 0351 12/12/20 0823  NA 135 137 136 139  K 4.5 4.5 3.9 4.3  CL 103 104 104 105  CO2 21*  --  24 29  GLUCOSE 144* 129* 108* 140*  BUN 20 21* 18 11  CREATININE 1.53* 1.60* 1.24 1.09  CALCIUM 8.8*  --  8.5* 8.5*  MG  --   --   --  2.0    GFR: Estimated Creatinine Clearance: 164.5 mL/min (by C-G formula based on SCr of 1.09 mg/dL). Liver Function Tests: Recent Labs  Lab 12/10/20 1725 12/11/20 0351  AST 23 20  ALT 18 17  ALKPHOS 68 57  BILITOT 0.7 0.8  PROT 7.0 6.1*  ALBUMIN 3.0* 2.6*    No results for input(s): LIPASE, AMYLASE in the last 168 hours. No results for input(s): AMMONIA in the last 168 hours. Coagulation Profile: Recent Labs  Lab 12/10/20 1725 12/11/20 0351  INR 1.3* 1.2    Cardiac Enzymes: No results for input(s): CKTOTAL, CKMB, CKMBINDEX, TROPONINI in the last 168 hours. BNP (last 3 results) No results for input(s): PROBNP in the last 8760 hours. HbA1C: Recent Labs    12/10/20 2209  HGBA1C 7.1*    CBG: Recent Labs  Lab 12/12/20 1149 12/12/20 1547 12/12/20 2107 12/13/20 0753 12/13/20 1214  GLUCAP 142* 115* 128* 114* 141*    Lipid Profile: No results for input(s): CHOL, HDL, LDLCALC, TRIG, CHOLHDL, LDLDIRECT in the last 72 hours. Thyroid Function Tests: No results for input(s): TSH, T4TOTAL, FREET4, T3FREE, THYROIDAB in the last 72 hours. Anemia Panel: No results for input(s): VITAMINB12, FOLATE, FERRITIN, TIBC, IRON, RETICCTPCT in the last 72 hours. Sepsis Labs: Recent Labs  Lab 12/10/20 1725 12/10/20 1925 12/10/20 2200 12/11/20 0100 12/11/20 0351  PROCALCITON  --   --   --   --  <0.10  LATICACIDVEN 3.5* 2.2* 1.3 2.1*  --      Recent Results (from the past 240 hour(s))  Resp Panel by RT-PCR (Flu A&B, Covid)  Nasopharyngeal Swab     Status: None   Collection Time: 12/10/20  5:25 PM   Specimen: Nasopharyngeal Swab; Nasopharyngeal(NP) swabs in vial transport medium  Result Value Ref Range Status   SARS Coronavirus 2 by RT PCR NEGATIVE NEGATIVE Final    Comment: (NOTE) SARS-CoV-2 target nucleic acids are NOT DETECTED.  The SARS-CoV-2 RNA is generally detectable in upper respiratory specimens during the acute phase of infection. The lowest concentration of SARS-CoV-2 viral copies this assay can detect is 138 copies/mL. A negative result does not  preclude SARS-Cov-2 infection and should not be used as the sole basis for treatment or other patient management decisions. A negative result may occur with  improper specimen collection/handling, submission of specimen other than nasopharyngeal swab, presence of viral mutation(s) within the areas targeted by this assay, and inadequate number of viral copies(<138 copies/mL). A negative result must be combined with clinical observations, patient history, and epidemiological information. The expected result is Negative.  Fact Sheet for Patients:  BloggerCourse.com  Fact Sheet for Healthcare Providers:  SeriousBroker.it  This test is no t yet approved or cleared by the [place] FDA and  has been authorized for detection and/or diagnosis of SARS-CoV-2 by FDA under an Emergency Use Authorization (EUA). This EUA will remain  in effect (meaning this test can be used) for the duration of the COVID-19 declaration under Section 564(b)(1) of the Act, 21 U.S.C.section 360bbb-3(b)(1), unless the authorization is terminated  or revoked sooner.       Influenza A by PCR NEGATIVE NEGATIVE Final   Influenza B by PCR NEGATIVE NEGATIVE Final    Comment: (NOTE) The Xpert Xpress SARS-CoV-2/FLU/RSV plus assay is intended as an aid in the diagnosis of influenza from Nasopharyngeal swab specimens and should not be  used as a sole basis for treatment. Nasal washings and aspirates are unacceptable for Xpert Xpress SARS-CoV-2/FLU/RSV testing.  Fact Sheet for Patients: BloggerCourse.com  Fact Sheet for Healthcare Providers: SeriousBroker.it  This test is not yet approved or cleared by the [place] FDA and has been authorized for detection and/or diagnosis of SARS-CoV-2 by FDA under an Emergency Use Authorization (EUA). This EUA will remain in effect (meaning this test can be used) for the duration of the COVID-19 declaration under Section 564(b)(1) of the Act, 21 U.S.C. section 360bbb-3(b)(1), unless the authorization is terminated or revoked.  Performed at [facility], [address], [place], [place] [address]   Blood Culture (routine x 2)     Status: None (Preliminary result)   Collection Time: 12/10/20  6:00 PM   Specimen: BLOOD RIGHT ARM  Result Value Ref Range Status   Specimen Description BLOOD RIGHT ARM  Final   Special Requests   Final    BOTTLES DRAWN AEROBIC AND ANAEROBIC Blood Culture results may not be optimal due to an inadequate volume of blood received in culture bottles   Culture   Final    NO GROWTH 3 DAYS Performed at [facility], [address], [place], [place] [address]    Report Status PENDING  Incomplete  Urine culture     Status: None   Collection Time: 12/10/20  7:55 PM   Specimen: In/Out Cath Urine  Result Value Ref Range Status   Specimen Description IN/OUT CATH URINE  Final   Special Requests NONE  Final   Culture   Final    NO GROWTH Performed at [facility], [address] [place], [place] [address]    Report Status 12/12/2020 FINAL  Final  Urine Culture     Status: None   Collection Time: 12/11/20  8:16 AM   Specimen: Urine, Random  Result Value Ref Range Status   Specimen Description URINE, RANDOM  Final   Special Requests NONE  Final   Culture   Final    NO  GROWTH Performed at [facility], [address] [place], [place] [address]    Report Status 12/12/2020 FINAL  Final  Radiology Studies: No results found.      Scheduled Meds:  amiodarone  200 mg Oral Daily   atorvastatin  20 mg Oral QPM   Chlorhexidine Gluconate Cloth  6 each Topical Daily   docusate sodium  100 mg Oral Daily   ezetimibe  10 mg Oral Daily   hydrocortisone  15 mg Oral Daily   hydrocortisone  5 mg Oral Daily   hydrocortisone  5 mg Oral Daily   insulin aspart  0-15 Units Subcutaneous TID WC   insulin aspart  10 Units Subcutaneous TID WC   insulin detemir  40 Units Subcutaneous BID   levothyroxine  75 mcg Oral Q0600   nortriptyline  50 mg Oral BID   pantoprazole  40 mg Oral BID   pregabalin  300 mg Oral BID   rivaroxaban  20 mg Oral Q supper   saccharomyces boulardii  250 mg Oral Daily   senna-docusate  1 tablet Oral BID   tamsulosin  0.4 mg Oral Daily   zinc sulfate  220 mg Oral Daily   Continuous Infusions:  lactated ringers 100 mL/hr at 12/13/20 0615     LOS: 3 days    Time spent: 35 minutes    Kellye Mizner A Rip Hawes, MD [facility]   If 7PM-7AM, please contact night-coverage [email or web address]  12/13/2020, 12:38 PM

## 2020-12-13 NOTE — Progress Notes (Signed)
Ok to stop cefepime per Dr. Juanito Doom.  Ulyses Southward, PharmD, BCIDP, AAHIVP, CPP Infectious Disease Pharmacist 12/13/2020 9:36 AM

## 2020-12-13 NOTE — Progress Notes (Signed)
PT Cancellation Note  Patient Details Name: Carl Hill MRN: [id number] DOB: [birth year]   Cancelled Treatment:    Reason Eval/Treat Not Completed: Patient declined, no reason specified declines PT- dealing with painful constipation and refused even sitting up at EOB as he reports this is quite painful when he is in this state. Will check back as able/time and schedule allow.   Madelaine Etienne, DPT, PN1   Supplemental Physical Therapist [facility]    Pager [phone] Acute Rehab Office [phone]

## 2020-12-13 NOTE — Progress Notes (Signed)
OT Cancellation Note  Patient Details Name: Carl Hill MRN: [id number] DOB: [birth year]   Cancelled Treatment:    Reason Eval/Treat Not Completed: Patient declined, no reason specified Pt declined at this time as he reports he was trying to pass a BM. OT will return as time allows and pt is appropriate.   [facility] [facility] OTR/L Acute Rehabilitation Services Office: [phone]  Rebeca Alert 12/13/2020, 12:35 PM

## 2020-12-14 LAB — GLUCOSE, CAPILLARY
Glucose-Capillary: 135 mg/dL — ABNORMAL HIGH (ref 70–99)
Glucose-Capillary: 166 mg/dL — ABNORMAL HIGH (ref 70–99)
Glucose-Capillary: 146 mg/dL — ABNORMAL HIGH (ref 70–99)
Glucose-Capillary: 234 mg/dL — ABNORMAL HIGH (ref 70–99)
Glucose-Capillary: 96 mg/dL (ref 70–99)

## 2020-12-14 LAB — BASIC METABOLIC PANEL
Anion gap: 6 (ref 5–15)
BUN: 12 mg/dL (ref 6–20)
CO2: 29 mmol/L (ref 22–32)
Calcium: 8.4 mg/dL — ABNORMAL LOW (ref 8.9–10.3)
Chloride: 102 mmol/L (ref 98–111)
Creatinine, Ser: 1.08 mg/dL (ref 0.61–1.24)
GFR, Estimated: >60 mL/min (ref >60)
Glucose, Bld: 145 mg/dL — ABNORMAL HIGH (ref 70–99)
Potassium: 3.8 mmol/L (ref 3.5–5.1)
Sodium: 137 mmol/L (ref 135–145)

## 2020-12-14 LAB — CBC
HCT: 37.4 % — ABNORMAL LOW (ref 39.0–52.0)
Hemoglobin: 11.7 g/dL — ABNORMAL LOW (ref 13.0–17.0)
MCH: 25.5 pg — ABNORMAL LOW (ref 26.0–34.0)
MCHC: 31.3 g/dL (ref 30.0–36.0)
MCV: 81.5 fL (ref 80.0–100.0)
Platelets: 184 10*3/uL (ref 150–400)
RBC: 4.59 MIL/uL (ref 4.22–5.81)
RDW: 18.3 % — ABNORMAL HIGH (ref 11.5–15.5)
WBC: 8.2 10*3/uL (ref 4.0–10.5)
nRBC: 0 % (ref 0.0–0.2)

## 2020-12-14 NOTE — Progress Notes (Signed)
SATURATION QUALIFICATIONS: (This note is used to comply with regulatory documentation for home oxygen)  Patient Saturations on Room Air at Rest = 94%  Patient Saturations on Room Air while Ambulating = 92%  Patient Saturations on 0 Liters of oxygen while Ambulating = 92 %  Please briefly explain why patient needs home oxygen:  Patient is non-compliant with CPAP at night, and does desat into 80%.  Dr. Sunnie Nielsen has ordered home oxygen for at night when patient is sleeping.

## 2020-12-14 NOTE — Progress Notes (Signed)
Physical Therapy Treatment Patient Details Name: Carl Hill MRN: [id number] DOB: 12-Dec-1970 Today's Date: 12/14/2020    History of Present Illness 50yo male admitted 12/10/20 with near syncope. Admitted with sepsis. PMH A-fib, DM, HTN, abdominal surgery, I&D perineal wound, R heel wound, gastric sleeve resction, obesity    PT Comments    Pt able to get OOB today but experienced symptomatic hypotension in standing which limited ambulation for safety concerns. Pt stood with RW and min A +2 for safety. Performed seated and standing there ex and pregait actvities.  BP supine 120/81  HR 93 bpm       Sitting 113/88         95 bpm       Standing 89/63       69 bpm       Supine  178/90       113 bpm SPO2 92% on RA   Follow Up Recommendations  Home health PT;Supervision for mobility/OOB     Equipment Recommendations  Wheelchair (measurements PT);Wheelchair cushion (measurements PT);3in1 (PT) (bariatric)    Recommendations for Other Services       Precautions / Restrictions Precautions Precautions: Fall Precaution Comments: perineal wound, peripheral neuropathy, obesity Restrictions Weight Bearing Restrictions: No    Mobility  Bed Mobility Overal bed mobility: Needs Assistance Bed Mobility: Supine to Sit;Sit to Supine     Supine to sit: Supervision Sit to supine: Supervision   General bed mobility comments: mattress deflated for supine to sit, pt able to come to EOB with momentum    Transfers Overall transfer level: Needs assistance Equipment used: Rolling walker (2 wheeled) Transfers: Sit to/from [employer] Sit to Stand: Supervision Stand pivot transfers: Supervision       General transfer comment: min A with RW, +2 for safety due to pt feeling dizzy with first stand. Dizziness a little less second time but BP remained low  Ambulation/Gait Ambulation/Gait assistance: Min assist Gait Distance (Feet): 3 Feet Assistive device: Rolling walker (2  wheeled) Gait Pattern/deviations: Step-to pattern   Gait velocity interpretation: <1.31 ft/sec, indicative of household ambulator General Gait Details: marching in place and along bedside. Did not take pt away from bed due to hypotension and pt's size   Stairs             Wheelchair Mobility    Modified Rankin (Stroke Patients Only)       Balance Overall balance assessment: Needs assistance Sitting-balance support: No upper extremity supported;Feet supported Sitting balance-Leahy Scale: Good     Standing balance support: Bilateral upper extremity supported Standing balance-Leahy Scale: Poor Standing balance comment: able to static stand for short bouts                            Cognition Arousal/Alertness: Awake/alert Behavior During Therapy: Impulsive;WFL for tasks assessed/performed Overall Cognitive Status: Within Functional Limits for tasks assessed                                 General Comments: pt needs cues to remain on task during session      Exercises General Exercises - Lower Extremity Long Arc Quad: AROM;Both;10 reps;Seated Hip Flexion/Marching: AROM;Both;10 reps;Standing Other Exercises Other Exercises: seated B shoulder flexion x10 Other Exercises: seated isometric pulling hands apart 5 sec 5x    General Comments General comments (skin integrity, edema, etc.): SpO2 92% on RA  Pertinent Vitals/Pain Pain Assessment: Faces Faces Pain Scale: Hurts a little bit Pain Location: bottom/wound Pain Descriptors / Indicators: Tender Pain Intervention(s): Monitored during session    Home Living                      Prior Function            PT Goals (current goals can now be found in the care plan section) Acute Rehab PT Goals Patient Stated Goal: be able to take a shower PT Goal Formulation: With patient/family Time For Goal Achievement: 12/26/20 Potential to Achieve Goals: Fair Progress towards PT  goals: Progressing toward goals    Frequency    Min 3X/week      PT Plan Current plan remains appropriate    Co-evaluation              AM-PAC PT "6 Clicks" Mobility   Outcome Measure  Help needed turning from your back to your side while in a flat bed without using bedrails?: None Help needed moving from lying on your back to sitting on the side of a flat bed without using bedrails?: None Help needed moving to and from a bed to a chair (including a wheelchair)?: A Little Help needed standing up from a chair using your arms (e.g., wheelchair or bedside chair)?: A Little Help needed to walk in hospital room?: A Lot Help needed climbing 3-5 steps with a railing? : A Lot 6 Click Score: 18    End of Session   Activity Tolerance: Other (comment) (limited by dizziness) Patient left: in bed;with call bell/phone within reach Nurse Communication: Mobility status (BP) PT Visit Diagnosis: Muscle weakness (generalized) (M62.81);Unsteadiness on feet (R26.81);Difficulty in walking, not elsewhere classified (R26.2);History of falling (Z91.81)     Time: 8295-6213 PT Time Calculation (min) (ACUTE ONLY): 36 min  Charges:  $Gait Training: 8-22 mins $Therapeutic Activity: 8-22 mins                     Lyanne Co, PT  Acute Rehab Services  Pager [phone] Office [phone]    Lawana Chambers Cristhian Vanhook 12/14/2020, 1:57 PM

## 2020-12-14 NOTE — Progress Notes (Signed)
Occupational Therapy Treatment Patient Details Name: Carl Hill MRN: [id number] DOB: 19-Apr-1971 Today's Date: 12/14/2020    History of present illness 50yo male admitted 12/10/20 with near syncope. Admitted with sepsis. PMH A-fib, DM, HTN, abdominal surgery, I&D perineal wound, R heel wound, gastric sleeve resction, obesity   OT comments  Patient with good participation and feeling better this date.  Patient largely supervision with bed mobility, Min A for lower body ADL and Min Guard for pivot to [facility] and back to bed.  No dizziness noted,  Patient is expecting to discharge home Sunday or Monday depending on the results of a sleep study.  OT can follow acutely, he would like to get clearance to take a shower.  HH OT is recommended to increase independence with showers at home.    Follow Up Recommendations  Home health OT;Supervision - Intermittent    Equipment Recommendations       Recommendations for Other Services      Precautions / Restrictions Precautions Precautions: Fall Precaution Comments: perineal wound, peripheral neuropathy, obesity Restrictions Weight Bearing Restrictions: No       Mobility Bed Mobility Overal bed mobility: Needs Assistance Bed Mobility: Supine to Sit;Sit to Supine     Supine to sit: Supervision Sit to supine: Supervision        Transfers Overall transfer level: Needs assistance Equipment used: Rolling walker (2 wheeled) Transfers: Sit to/from [employer] Sit to Stand: Supervision Stand pivot transfers: Supervision            Balance Overall balance assessment: Needs assistance Sitting-balance support: No upper extremity supported;Feet supported Sitting balance-Leahy Scale: Good     Standing balance support: Bilateral upper extremity supported Standing balance-Leahy Scale: Poor Standing balance comment: able to static stand for short bouts                           ADL either performed or  assessed with clinical judgement   ADL                       Lower Body Dressing: Minimal assistance;Sit to/from Market researcher Details (indicate cue type and reason): patient with min A to start items over his feet, but able to grasp and pull up to his waist, stand and alternate hands and RW for pant management.                 Vision Baseline Vision/History: No visual deficits Patient Visual Report: No change from baseline     Perception     Praxis      Cognition Arousal/Alertness: Awake/alert Behavior During Therapy: Impulsive;WFL for tasks assessed/performed Overall Cognitive Status: Within Functional Limits for tasks assessed                                                            Pertinent Vitals/ Pain       Pain Assessment: Faces Faces Pain Scale: Hurts a little bit Pain Location: bottom/wound Pain Descriptors / Indicators: Tender Pain Intervention(s): Monitored during session  Frequency  Min 2X/week        Progress Toward Goals  OT Goals(current goals can now be found in the care plan section)  Progress towards OT goals: Progressing toward goals  Acute Rehab OT Goals Patient Stated Goal: be able to take a shower OT Goal Formulation: With patient Time For Goal Achievement: 12/26/20 Potential to Achieve Goals: Good  Plan      Co-evaluation                 AM-PAC OT "6 Clicks" Daily Activity     Outcome Measure   Help from another person eating meals?: None Help from another person taking care of personal grooming?: None Help from another person toileting, which includes using toliet, bedpan, or urinal?: A Little Help from another person bathing (including washing, rinsing, drying)?: A Little Help from another person to put on and taking off regular upper body clothing?: A Little Help from another person to  put on and taking off regular lower body clothing?: A Little 6 Click Score: 20    End of Session Equipment Utilized During Treatment: Oxygen  OT Visit Diagnosis: Unsteadiness on feet (R26.81);Other abnormalities of gait and mobility (R26.89);Muscle weakness (generalized) (M62.81)   Activity Tolerance Patient tolerated treatment well   Patient Left in bed;with call bell/phone within reach   Nurse Communication          Time: 2409-7353 OT Time Calculation (min): 24 min  Charges: OT Treatments $Self Care/Home Management : 23-37 mins  12/14/2020  Rich, OTR/L  Acute Rehabilitation Services  Office:  [phone]    Suzanna Obey 12/14/2020, 1:52 PM

## 2020-12-14 NOTE — TOC Progression Note (Signed)
Transition of Care [facility]) - Progression Note    Patient Details  Name: Carl Hill MRN: [id number] Date of Birth: 1971-01-21  Transition of Care [facility]) CM/SW Contact  Lawerance Sabal, RN Phone Number: 12/14/2020, 1:10 PM  Clinical Narrative:    Consult for nocturnal O2 set up fpr home. Determined patient does not qualify for continuous oxygen.  Spoke w patient. He states that he has a CPAP at home that he has not used for 7 years through [employer]. He states that the mask never fit him well, and he was not compliant wearing it.  He states that he gets all of his supplies through [employer] and wants to continue with them.  Discussed w MD that nocturnal set up would require overnight sat test. Md ordered test, CM spoke w RT to notify them to do this tonight. Bedside nurse updated.        Expected Discharge Plan: Home w Home Health Services Barriers to Discharge: Continued Medical Work up  Expected Discharge Plan and Services Expected Discharge Plan: Home w Home Health Services   Discharge Planning Services: CM Consult   Living arrangements for the past 2 months: Single Family Home                           HH Arranged: PT, RN [facility] Agency: [facility] Date HH Agency Contacted: 12/11/20 Time HH Agency Contacted: 1445 Representative spoke with at [facility] Agency: [facility] Grippe   Social Determinants of Health (SDOH) Interventions    Readmission Risk Interventions Readmission Risk Prevention Plan 07/26/2020  Transportation Screening Complete  PCP or Specialist Appt within 3-5 Days Complete  HRI or Home Care Consult Complete  Social Work Consult for Recovery Care Planning/Counseling Complete  Palliative Care Screening Complete  Medication Review Oceanographer) Complete  Some recent data might be hidden

## 2020-12-14 NOTE — Progress Notes (Signed)
PROGRESS NOTE    Carl Hill  JJO:[id number] DOB: [birth year] DOA: 12/10/2020 PCP: Malka So., MD   Brief Narrative: 50 year old with past medical history significant for diabetes, hypertension, morbid obesity, A. fib on amiodarone and Xarelto, history of Fournier gangrene and sepsis earlier this year, and had a prolonged hospital course, history of sacral decubitus,  this has been healing.  Patient presents to the ED complaining of 3 near syncope episode occurred the afternoon of admission. On evaluation heart rate was 110 and systolic blood pressure was in the 80s.  In the ED that he had a temperature of 101.  Blood pressure in the ED as low as 69.    Assessment & Plan:   Active Problems:   AKI (acute kidney injury) (HCC)   Atrial fibrillation, chronic (HCC)   Essential hypertension   Chronic pain disorder   Morbid obesity with BMI of 60.0-69.9, adult (HCC)   DM2 (diabetes mellitus, type 2) (HCC)  1-SIRS: -Patient presents with fever, tachycardia tachypnea. -No source for infection identified so far. -Blood cultures; no growth in the last 12 hours. -Urine culture; no growth.  -Chest x-ray bilateral atelectasis.  Patient denies cough -CT abdomen and pelvis: No acute abnormality of the abdomen or pelvis.  Soft tissue thickening at the upper left gluteal cleft without fluid collection. -Discontinue Flagyl, Linezolid 7/14.  Discontinue Cefepime 7/15.  -Monitor off antibiotics. Afebrile.   2-Hypotension: Orthostatic hypotension Suspect this is related to adrenal insufficiency.  Cortisol was low at 2.9. Patient was started on hydrocortisone .  Will arrange follow-up with endocrinologist for cosynthroipin test.  Continue with hydrocortisone. Still orthostatic will order [facility].  Could consider low dose midodrine.  3-AKI: Continue with IV fluids Hold lisinopril Will plan to continue to hold lisinopril at discharge.   Hypertension: Continue to hold lisinopril  A.  fib: Continue with Xarelto and amiodarone.  Hold Cardizem due to hypotension  Diabetes type 2: We will increase Levemir to 40 units twice daily and increase meal coverage to 10 units 3 times daily  Chronic pain; continue with lyrica and pamelor.   OSA; he has not been using CPAP at home.  He has been requiring oxygen at night. Plan to check nocturnal pulse oxymetri  Estimated body mass index is 55.47 kg/m as calculated from the following:   Height as of this encounter: 6\' 6"  (1.981 m).   Weight as of this encounter: 217.7 kg.   DVT prophylaxis: On Xarelto Code Status: Full code Family Communication: Care discussed with patient Disposition Plan:  Status is: Inpatient  Remains inpatient appropriate because:Hemodynamically unstable  Dispo: The patient is from: Home              Anticipated d/c is to: Home              Patient currently is not medically stable to d/c.plan to check nocturnal oxygen.    Difficult to place patient No        Consultants:  None  Procedures:  none  Antimicrobials:    Subjective: He is complaining of Constipation, had BM last night but feels he need to have more BM, having cramps pain.  He doesn't think he will be able to work with PT due to cramping abdominal pain, and if he is moving his Bowel.   Objective: Vitals:   12/13/20 2344 12/14/20 0358 12/14/20 0740 12/14/20 1205  BP: 121/66 (!) 144/92 126/77 110/76  Pulse: 94 96 94 97  Resp: 15 15 14  17  Temp: 98.2 F (36.8 C) 98.5 F (36.9 C) 98.1 F (36.7 C) 98.6 F (37 C)  TempSrc: Axillary Axillary Oral Oral  SpO2: 93% 93% 95% 99%  Weight:      Height:        Intake/Output Summary (Last 24 hours) at 12/14/2020 1452 Last data filed at 12/14/2020 1036 Gross per 24 hour  Intake 720 ml  Output 2300 ml  Net -1580 ml    Filed Weights   12/10/20 2037  Weight: (!) 217.7 kg    Examination:  General exam:NAD Respiratory system: CTA Cardiovascular system: S 1, S 2  RRR Gastrointestinal system: BS present, soft, nt Central nervous system:  Alert Extremities: no edema   Data Reviewed: I have personally reviewed following labs and imaging studies  CBC: Recent Labs  Lab 12/10/20 1725 12/10/20 1821 12/11/20 0351 12/12/20 0823 12/14/20 0944  WBC 9.8  --  8.5 8.2 8.2  NEUTROABS 6.5  --   --   --   --   HGB 12.2* 11.9* 11.0* 11.6* 11.7*  HCT 39.7 35.0* 35.5* 37.2* 37.4*  MCV 81.0  --  80.5 80.9 81.5  PLT 231  --  186 181 184    Basic Metabolic Panel: Recent Labs  Lab 12/10/20 1725 12/10/20 1821 12/11/20 0351 12/12/20 0823 12/14/20 0944  NA 135 137 136 139 137  K 4.5 4.5 3.9 4.3 3.8  CL 103 104 104 105 102  CO2 21*  --  24 29 29   GLUCOSE 144* 129* 108* 140* 145*  BUN 20 21* 18 11 12   CREATININE 1.53* 1.60* 1.24 1.09 1.08  CALCIUM 8.8*  --  8.5* 8.5* 8.4*  MG  --   --   --  2.0  --     GFR: Estimated Creatinine Clearance: 166.1 mL/min (by C-G formula based on SCr of 1.08 mg/dL). Liver Function Tests: Recent Labs  Lab 12/10/20 1725 12/11/20 0351  AST 23 20  ALT 18 17  ALKPHOS 68 57  BILITOT 0.7 0.8  PROT 7.0 6.1*  ALBUMIN 3.0* 2.6*    No results for input(s): LIPASE, AMYLASE in the last 168 hours. No results for input(s): AMMONIA in the last 168 hours. Coagulation Profile: Recent Labs  Lab 12/10/20 1725 12/11/20 0351  INR 1.3* 1.2    Cardiac Enzymes: No results for input(s): CKTOTAL, CKMB, CKMBINDEX, TROPONINI in the last 168 hours. BNP (last 3 results) No results for input(s): PROBNP in the last 8760 hours. HbA1C: No results for input(s): HGBA1C in the last 72 hours.  CBG: Recent Labs  Lab 12/13/20 1214 12/13/20 1602 12/13/20 2046 12/14/20 0740 12/14/20 1209  GLUCAP 141* 132* 177* 166* 146*    Lipid Profile: No results for input(s): CHOL, HDL, LDLCALC, TRIG, CHOLHDL, LDLDIRECT in the last 72 hours. Thyroid Function Tests: No results for input(s): TSH, T4TOTAL, FREET4, T3FREE, THYROIDAB in the last  72 hours. Anemia Panel: No results for input(s): VITAMINB12, FOLATE, FERRITIN, TIBC, IRON, RETICCTPCT in the last 72 hours. Sepsis Labs: Recent Labs  Lab 12/10/20 1725 12/10/20 1925 12/10/20 2200 12/11/20 0100 12/11/20 0351  PROCALCITON  --   --   --   --  <0.10  LATICACIDVEN 3.5* 2.2* 1.3 2.1*  --      Recent Results (from the past 240 hour(s))  Resp Panel by RT-PCR (Flu A&B, Covid) Nasopharyngeal Swab     Status: None   Collection Time: 12/10/20  5:25 PM   Specimen: Nasopharyngeal Swab; Nasopharyngeal(NP) swabs in vial transport medium  Result Value Ref Range Status   SARS Coronavirus 2 by RT PCR NEGATIVE NEGATIVE Final    Comment: (NOTE) SARS-CoV-2 target nucleic acids are NOT DETECTED.  The SARS-CoV-2 RNA is generally detectable in upper respiratory specimens during the acute phase of infection. The lowest concentration of SARS-CoV-2 viral copies this assay can detect is 138 copies/mL. A negative result does not preclude SARS-Cov-2 infection and should not be used as the sole basis for treatment or other patient management decisions. A negative result may occur with  improper specimen collection/handling, submission of specimen other than nasopharyngeal swab, presence of viral mutation(s) within the areas targeted by this assay, and inadequate number of viral copies(<138 copies/mL). A negative result must be combined with clinical observations, patient history, and epidemiological information. The expected result is Negative.  Fact Sheet for Patients:  BloggerCourse.com  Fact Sheet for Healthcare Providers:  SeriousBroker.it  This test is no t yet approved or cleared by the [place] FDA and  has been authorized for detection and/or diagnosis of SARS-CoV-2 by FDA under an Emergency Use Authorization (EUA). This EUA will remain  in effect (meaning this test can be used) for the duration of the COVID-19 declaration  under Section 564(b)(1) of the Act, 21 U.S.C.section 360bbb-3(b)(1), unless the authorization is terminated  or revoked sooner.       Influenza A by PCR NEGATIVE NEGATIVE Final   Influenza B by PCR NEGATIVE NEGATIVE Final    Comment: (NOTE) The Xpert Xpress SARS-CoV-2/FLU/RSV plus assay is intended as an aid in the diagnosis of influenza from Nasopharyngeal swab specimens and should not be used as a sole basis for treatment. Nasal washings and aspirates are unacceptable for Xpert Xpress SARS-CoV-2/FLU/RSV testing.  Fact Sheet for Patients: BloggerCourse.com  Fact Sheet for Healthcare Providers: SeriousBroker.it  This test is not yet approved or cleared by the [place] FDA and has been authorized for detection and/or diagnosis of SARS-CoV-2 by FDA under an Emergency Use Authorization (EUA). This EUA will remain in effect (meaning this test can be used) for the duration of the COVID-19 declaration under Section 564(b)(1) of the Act, 21 U.S.C. section 360bbb-3(b)(1), unless the authorization is terminated or revoked.  Performed at [facility], [address], [place], [place] [address]   Blood Culture (routine x 2)     Status: None (Preliminary result)   Collection Time: 12/10/20  6:00 PM   Specimen: BLOOD RIGHT ARM  Result Value Ref Range Status   Specimen Description BLOOD RIGHT ARM  Final   Special Requests   Final    BOTTLES DRAWN AEROBIC AND ANAEROBIC Blood Culture results may not be optimal due to an inadequate volume of blood received in culture bottles   Culture   Final    NO GROWTH 3 DAYS Performed at [facility], [address], [place], [place] [address]    Report Status PENDING  Incomplete  Urine culture     Status: None   Collection Time: 12/10/20  7:55 PM   Specimen: In/Out Cath Urine  Result Value Ref Range Status   Specimen Description IN/OUT CATH URINE  Final   Special Requests NONE   Final   Culture   Final    NO GROWTH Performed at [facility], [address] [place], [place] [address]    Report Status 12/12/2020 FINAL  Final  Urine Culture     Status: None   Collection Time: 12/11/20  8:16 AM   Specimen: Urine,  Random  Result Value Ref Range Status   Specimen Description URINE, RANDOM  Final   Special Requests NONE  Final   Culture   Final    NO GROWTH Performed at [facility], [address], [place], [place] [address]    Report Status 12/12/2020 FINAL  Final          Radiology Studies: No results found.      Scheduled Meds:  amiodarone  200 mg Oral Daily   atorvastatin  20 mg Oral QPM   Chlorhexidine Gluconate Cloth  6 each Topical Daily   docusate sodium  100 mg Oral Daily   ezetimibe  10 mg Oral Daily   hydrocortisone  15 mg Oral Daily   hydrocortisone  5 mg Oral Daily   hydrocortisone  5 mg Oral Daily   insulin aspart  0-15 Units Subcutaneous TID WC   insulin aspart  10 Units Subcutaneous TID WC   insulin detemir  40 Units Subcutaneous BID   levothyroxine  75 mcg Oral Q0600   nortriptyline  50 mg Oral BID   pantoprazole  40 mg Oral BID   pregabalin  300 mg Oral BID   rivaroxaban  20 mg Oral Q supper   saccharomyces boulardii  250 mg Oral Daily   senna-docusate  1 tablet Oral BID   tamsulosin  0.4 mg Oral Daily   zinc sulfate  220 mg Oral Daily   Continuous Infusions:     LOS: 4 days    Time spent: 35 minutes    Marilyn Wing A Ravon Mcilhenny, MD [facility]   If 7PM-7AM, please contact night-coverage [email or web address]  12/14/2020, 2:52 PM

## 2020-12-15 LAB — CULTURE, BLOOD (ROUTINE X 2): Culture: NO GROWTH

## 2020-12-15 LAB — GLUCOSE, CAPILLARY
Glucose-Capillary: 143 mg/dL — ABNORMAL HIGH (ref 70–99)
Glucose-Capillary: 150 mg/dL — ABNORMAL HIGH (ref 70–99)
Glucose-Capillary: 111 mg/dL — ABNORMAL HIGH (ref 70–99)

## 2020-12-15 MED ORDER — SODIUM CHLORIDE 0.9 % IV BOLUS
500.0000 mL | Freq: Once | INTRAVENOUS | Status: AC
  Administered 2020-12-15: 500 mL via INTRAVENOUS

## 2020-12-15 MED ORDER — SODIUM CHLORIDE 0.9 % IV SOLN: INTRAVENOUS | Status: DC

## 2020-12-15 NOTE — TOC Progression Note (Signed)
Transition of Care [facility]) - Progression Note    Patient Details  Name: Carl Hill MRN: [id number] Date of Birth: 03-04-1971  Transition of Care [facility]) CM/SW Contact  Lawerance Sabal, RN Phone Number: 12/15/2020, 11:53 AM  Clinical Narrative:    Sherron Monday w RT to discuss results from nocturnal O2 test last night. She confirmed that test was done, but after speaking with tech support they have determined that the results could not be loaded into Epic and therefore the test would need to be repeated tonight. Avoidable day entered.  I informed Dr Sunnie Nielsen who will place order for test tonight, and RT knows to repeat test tonight as well for DC tomorrow.  Notified Oletha Cruel w/ [employer] of DME order, and need to interpret results in the AM.     Expected Discharge Plan: Home w Home Health Services Barriers to Discharge: Continued Medical Work up  Expected Discharge Plan and Services Expected Discharge Plan: Home w Home Health Services   Discharge Planning Services: CM Consult   Living arrangements for the past 2 months: Single Family Home                           HH Arranged: PT, RN [facility] Agency: [facility] Date HH Agency Contacted: 12/11/20 Time HH Agency Contacted: 1445 Representative spoke with at [facility] Agency: Pearson Grippe   Social Determinants of Health (SDOH) Interventions    Readmission Risk Interventions Readmission Risk Prevention Plan 07/26/2020  Transportation Screening Complete  PCP or Specialist Appt within 3-5 Days Complete  HRI or Home Care Consult Complete  Social Work Consult for Recovery Care Planning/Counseling Complete  Palliative Care Screening Complete  Medication Review Oceanographer) Complete  Some recent data might be hidden

## 2020-12-15 NOTE — Progress Notes (Addendum)
PROGRESS NOTE    Carl Hill  ZOX:[id number] DOB: 1971/02/09 DOA: 12/10/2020 PCP: Malka So., MD   Brief Narrative: 50 year old with past medical history significant for diabetes, hypertension, morbid obesity, A. fib on amiodarone and Xarelto, history of Fournier gangrene and sepsis earlier this year, and had a prolonged hospital course, history of sacral decubitus,  this has been healing.  Patient presents to the ED complaining of 3 near syncope episode occurred the afternoon of admission. On evaluation heart rate was 110 and systolic blood pressure was in the 80s.  In the ED that he had a temperature of 101.  Blood pressure in the ED as low as 69.    Assessment & Plan:   Active Problems:   AKI (acute kidney injury) (HCC)   Atrial fibrillation, chronic (HCC)   Essential hypertension   Chronic pain disorder   Morbid obesity with BMI of 60.0-69.9, adult (HCC)   DM2 (diabetes mellitus, type 2) (HCC)  1-SIRS: -Patient presents with fever, tachycardia, tachypnea. Sepsis ruled out  -No source for infection identified so far. -Blood cultures; no growth in the last 12 hours. -Urine culture; no growth.  -Chest x-ray bilateral atelectasis.  Patient denies cough -CT abdomen and pelvis: No acute abnormality of the abdomen or pelvis.  Soft tissue thickening at the upper left gluteal cleft without fluid collection. -Discontinue Flagyl, Linezolid 7/14.  Discontinue Cefepime 7/15.  Remain afebrile.   2-Hypotension: Orthostatic hypotension -Suspect this is related to adrenal insufficiency, vs component autonomic orthostatic hypotension. -Cortisol was low at 2.9. Patient was started on hydrocortisone .  Will arrange follow-up with endocrinologist for cosynthroipin test.  Continue with hydrocortisone.  Plan for IV fluids, he might got dehydrated from multiples BM. Will order abdominal binder. He is not able to tolerates ted hose  Due to Heart history would like to avoid midodrine  and florinef.   3-AKI: Hold lisinopril Will plan to continue to hold lisinopril at discharge.   Hypertension: Continue to hold lisinopril  A. fib: Continue with Xarelto and amiodarone.  Hold Cardizem due to hypotension  Diabetes type 2: We will increase Levemir to 40 units twice daily and increase meal coverage to 10 units 3 times daily  Chronic pain; continue with lyrica and pamelor.   OSA; he has not been using CPAP at home.  He has been requiring oxygen at night. Plan to check nocturnal pulse oxymetri Will need to repeat nocturnal pulse oxymetri  Estimated body mass index is 55.47 kg/m as calculated from the following:   Height as of this encounter: 6\' 6"  (1.981 m).   Weight as of this encounter: 217.7 kg.   DVT prophylaxis: On Xarelto Code Status: Full code Family Communication: Care discussed with patient Disposition Plan:  Status is: Inpatient  Remains inpatient appropriate because:Hemodynamically unstable  Dispo: The patient is from: Home              Anticipated d/c is to: Home              Patient currently is not medically stable to d/c.plan to check nocturnal oxygen today.    Difficult to place patient No        Consultants:  None  Procedures:  none  Antimicrobials:    Subjective: He is feeling well, he was not dizzy yesterday.  Had BM and he has been able to urinate after foley catheter was remove  Objective: Vitals:   12/14/20 2000 12/15/20 0141 12/15/20 0319 12/15/20 1130  BP: 123/75 117/60  140/79 124/70  Pulse: 100 99 91 89  Resp: (!) 22 20 18 18   Temp: 98.8 F (37.1 C) 98.6 F (37 C) 99 F (37.2 C) 98.7 F (37.1 C)  TempSrc: Oral Oral Oral Oral  SpO2: 95% 97% 94%   Weight:      Height:        Intake/Output Summary (Last 24 hours) at 12/15/2020 1311 Last data filed at 12/14/2020 1805 Gross per 24 hour  Intake 720 ml  Output --  Net 720 ml    Filed Weights   12/10/20 2037  Weight: (!) 217.7 kg    Examination:  General  exam: NAD Respiratory system: CTA Cardiovascular system: S 1, S 2 RRR Gastrointestinal system: BS present, soft, nt Central nervous system: alert Extremities: no edema   Data Reviewed: I have personally reviewed following labs and imaging studies  CBC: Recent Labs  Lab 12/10/20 1725 12/10/20 1821 12/11/20 0351 12/12/20 0823 12/14/20 0944  WBC 9.8  --  8.5 8.2 8.2  NEUTROABS 6.5  --   --   --   --   HGB 12.2* 11.9* 11.0* 11.6* 11.7*  HCT 39.7 35.0* 35.5* 37.2* 37.4*  MCV 81.0  --  80.5 80.9 81.5  PLT 231  --  186 181 184    Basic Metabolic Panel: Recent Labs  Lab 12/10/20 1725 12/10/20 1821 12/11/20 0351 12/12/20 0823 12/14/20 0944  NA 135 137 136 139 137  K 4.5 4.5 3.9 4.3 3.8  CL 103 104 104 105 102  CO2 21*  --  24 29 29   GLUCOSE 144* 129* 108* 140* 145*  BUN 20 21* 18 11 12   CREATININE 1.53* 1.60* 1.24 1.09 1.08  CALCIUM 8.8*  --  8.5* 8.5* 8.4*  MG  --   --   --  2.0  --     GFR: Estimated Creatinine Clearance: 166.1 mL/min (by C-G formula based on SCr of 1.08 mg/dL). Liver Function Tests: Recent Labs  Lab 12/10/20 1725 12/11/20 0351  AST 23 20  ALT 18 17  ALKPHOS 68 57  BILITOT 0.7 0.8  PROT 7.0 6.1*  ALBUMIN 3.0* 2.6*    No results for input(s): LIPASE, AMYLASE in the last 168 hours. No results for input(s): AMMONIA in the last 168 hours. Coagulation Profile: Recent Labs  Lab 12/10/20 1725 12/11/20 0351  INR 1.3* 1.2    Cardiac Enzymes: No results for input(s): CKTOTAL, CKMB, CKMBINDEX, TROPONINI in the last 168 hours. BNP (last 3 results) No results for input(s): PROBNP in the last 8760 hours. HbA1C: No results for input(s): HGBA1C in the last 72 hours.  CBG: Recent Labs  Lab 12/14/20 0740 12/14/20 1209 12/14/20 1555 12/14/20 1953 12/15/20 0955  GLUCAP 166* 146* 234* 96 143*    Lipid Profile: No results for input(s): CHOL, HDL, LDLCALC, TRIG, CHOLHDL, LDLDIRECT in the last 72 hours. Thyroid Function Tests: No results  for input(s): TSH, T4TOTAL, FREET4, T3FREE, THYROIDAB in the last 72 hours. Anemia Panel: No results for input(s): VITAMINB12, FOLATE, FERRITIN, TIBC, IRON, RETICCTPCT in the last 72 hours. Sepsis Labs: Recent Labs  Lab 12/10/20 1725 12/10/20 1925 12/10/20 2200 12/11/20 0100 12/11/20 0351  PROCALCITON  --   --   --   --  <0.10  LATICACIDVEN 3.5* 2.2* 1.3 2.1*  --      Recent Results (from the past 240 hour(s))  Resp Panel by RT-PCR (Flu A&B, Covid) Nasopharyngeal Swab     Status: None   Collection Time: 12/10/20  5:25 PM   Specimen: Nasopharyngeal Swab; Nasopharyngeal(NP) swabs in vial transport medium  Result Value Ref Range Status   SARS Coronavirus 2 by RT PCR NEGATIVE NEGATIVE Final    Comment: (NOTE) SARS-CoV-2 target nucleic acids are NOT DETECTED.  The SARS-CoV-2 RNA is generally detectable in upper respiratory specimens during the acute phase of infection. The lowest concentration of SARS-CoV-2 viral copies this assay can detect is 138 copies/mL. A negative result does not preclude SARS-Cov-2 infection and should not be used as the sole basis for treatment or other patient management decisions. A negative result may occur with  improper specimen collection/handling, submission of specimen other than nasopharyngeal swab, presence of viral mutation(s) within the areas targeted by this assay, and inadequate number of viral copies(<138 copies/mL). A negative result must be combined with clinical observations, patient history, and epidemiological information. The expected result is Negative.  Fact Sheet for Patients:  BloggerCourse.comhttps://www.fda.gov/media/152166/download  Fact Sheet for Healthcare Providers:  SeriousBroker.ithttps://www.fda.gov/media/152162/download  This test is no t yet approved or cleared by the [place] FDA and  has been authorized for detection and/or diagnosis of SARS-CoV-2 by FDA under an Emergency Use Authorization (EUA). This EUA will remain  in effect (meaning  this test can be used) for the duration of the COVID-19 declaration under Section 564(b)(1) of the Act, 21 U.S.C.section 360bbb-3(b)(1), unless the authorization is terminated  or revoked sooner.       Influenza A by PCR NEGATIVE NEGATIVE Final   Influenza B by PCR NEGATIVE NEGATIVE Final    Comment: (NOTE) The Xpert Xpress SARS-CoV-2/FLU/RSV plus assay is intended as an aid in the diagnosis of influenza from Nasopharyngeal swab specimens and should not be used as a sole basis for treatment. Nasal washings and aspirates are unacceptable for Xpert Xpress SARS-CoV-2/FLU/RSV testing.  Fact Sheet for Patients: BloggerCourse.comhttps://www.fda.gov/media/152166/download  Fact Sheet for Healthcare Providers: SeriousBroker.ithttps://www.fda.gov/media/152162/download  This test is not yet approved or cleared by the [place] FDA and has been authorized for detection and/or diagnosis of SARS-CoV-2 by FDA under an Emergency Use Authorization (EUA). This EUA will remain in effect (meaning this test can be used) for the duration of the COVID-19 declaration under Section 564(b)(1) of the Act, 21 U.S.C. section 360bbb-3(b)(1), unless the authorization is terminated or revoked.  Performed at [facility], [address] [place], [place] [address]   Blood Culture (routine x 2)     Status: None (Preliminary result)   Collection Time: 12/10/20  6:00 PM   Specimen: BLOOD RIGHT ARM  Result Value Ref Range Status   Specimen Description BLOOD RIGHT ARM  Final   Special Requests   Final    BOTTLES DRAWN AEROBIC AND ANAEROBIC Blood Culture results may not be optimal due to an inadequate volume of blood received in culture bottles   Culture   Final    NO GROWTH 4 DAYS Performed at [facility], [address] [place], [place] [address]    Report Status PENDING  Incomplete  Urine culture     Status: None   Collection Time: 12/10/20  7:55 PM   Specimen: In/Out Cath Urine  Result Value Ref Range Status    Specimen Description IN/OUT CATH URINE  Final   Special Requests NONE  Final   Culture   Final    NO GROWTH Performed at [facility], [address] [place], [place] [address]    Report Status 12/12/2020 FINAL  Final  Urine Culture  Status: None   Collection Time: 12/11/20  8:16 AM   Specimen: Urine, Random  Result Value Ref Range Status   Specimen Description URINE, RANDOM  Final   Special Requests NONE  Final   Culture   Final    NO GROWTH Performed at [facility], [address], [place], [place] [address]    Report Status 12/12/2020 FINAL  Final          Radiology Studies: No results found.      Scheduled Meds:  amiodarone  200 mg Oral Daily   atorvastatin  20 mg Oral QPM   Chlorhexidine Gluconate Cloth  6 each Topical Daily   docusate sodium  100 mg Oral Daily   ezetimibe  10 mg Oral Daily   hydrocortisone  15 mg Oral Daily   hydrocortisone  5 mg Oral Daily   hydrocortisone  5 mg Oral Daily   insulin aspart  0-15 Units Subcutaneous TID WC   insulin aspart  10 Units Subcutaneous TID WC   insulin detemir  40 Units Subcutaneous BID   levothyroxine  75 mcg Oral Q0600   nortriptyline  50 mg Oral BID   pantoprazole  40 mg Oral BID   pregabalin  300 mg Oral BID   rivaroxaban  20 mg Oral Q supper   saccharomyces boulardii  250 mg Oral Daily   senna-docusate  1 tablet Oral BID   tamsulosin  0.4 mg Oral Daily   zinc sulfate  220 mg Oral Daily   Continuous Infusions:  sodium chloride        LOS: 5 days    Time spent: 35 minutes    Rage Beever A Tevin Shillingford, MD [facility]   If 7PM-7AM, please contact night-coverage [email or web address]  12/15/2020, 1:11 PM

## 2020-12-15 NOTE — Progress Notes (Signed)
Overnight pulse oximetry set up on patient for HS study.

## 2020-12-15 NOTE — Progress Notes (Signed)
Overnight pulse ox monitoring placed on patient.

## 2020-12-16 LAB — GLUCOSE, CAPILLARY
Glucose-Capillary: 115 mg/dL — ABNORMAL HIGH (ref 70–99)
Glucose-Capillary: 121 mg/dL — ABNORMAL HIGH (ref 70–99)
Glucose-Capillary: 110 mg/dL — ABNORMAL HIGH (ref 70–99)
Glucose-Capillary: 148 mg/dL — ABNORMAL HIGH (ref 70–99)

## 2020-12-16 MED ORDER — HYDROCORTISONE 5 MG PO TABS: ORAL_TABLET | ORAL | 1 refills | Status: DC

## 2020-12-16 NOTE — TOC Transition Note (Signed)
Transition of Care [facility]) - CM/SW Discharge Note   Patient Details  Name: Carl Hill MRN: [id number] Date of Birth: 18-Mar-1971  [facility] CM/SW Contact:  Leone Haven, RN Phone Number: 12/16/2020, 4:18 PM   Clinical Narrative:    Patient is set up with [facility] for home oxygen.  He did a nocturnal overnight saturation .  Per Ian Malkin with [employer] they will set up the oxygen at the patient 's home.  NCM notified Kenzie with [facility] of discharge.    Final next level of care: Home w Home Health Services Barriers to Discharge: No Barriers Identified   Patient Goals and CMS Choice Patient states their goals for this hospitalization and ongoing recovery are:: return home      Discharge Placement                       Discharge Plan and Services   Discharge Planning Services: CM Consult                      HH Arranged: PT, RN [facility] Agency: [facility] Date HH Agency Contacted: 12/11/20 Time HH Agency Contacted: 1445 Representative spoke with at [facility] Agency: Pearson Grippe  Social Determinants of Health (SDOH) Interventions     Readmission Risk Interventions Readmission Risk Prevention Plan 07/26/2020  Transportation Screening Complete  PCP or Specialist Appt within 3-5 Days Complete  HRI or Home Care Consult Complete  Social Work Consult for Recovery Care Planning/Counseling Complete  Palliative Care Screening Complete  Medication Review Oceanographer) Complete  Some recent data might be hidden

## 2020-12-16 NOTE — Progress Notes (Signed)
CSW received request for [facility]. CSW printed forms to the unit and contacted [facility] for pickup.  Joaquin Courts, MSW, [facility]

## 2020-12-16 NOTE — Progress Notes (Signed)
Nsg Discharge Note  Patient to be transported home via [facility]. Discharge paperwork reviewed.  Admit Date:  12/10/2020 Discharge date: 12/16/2020   Carl Hill to be D/C'd Home per MD order.  AVS completed.  Copy for chart, and copy for patient signed, and dated. Patient/caregiver able to verbalize understanding.  Discharge Medication: Allergies as of 12/16/2020       Reactions   Bee Venom Anaphylaxis   Other reaction(s): Unknown Other reaction(s): Unknown Other reaction(s): Unknown   Sglt2 Inhibitors Other (See Comments)   Necrotizing infection of the perineum   Other Hives        Medication List     STOP taking these medications    diltiazem 240 MG 24 hr capsule Commonly known as: CARDIZEM CD   lisinopril 5 MG tablet Commonly known as: ZESTRIL   polycarbophil 625 MG tablet Commonly known as: FIBERCON       TAKE these medications    acetaminophen 325 MG tablet Commonly known as: TYLENOL Take 1-2 tablets (325-650 mg total) by mouth every 4 (four) hours as needed for mild pain.   albuterol (2.5 MG/3ML) 0.083% nebulizer solution Commonly known as: PROVENTIL Take 3 mLs (2.5 mg total) by nebulization every 4 (four) hours as needed for wheezing or shortness of breath.   ALIVE MENS ENERGY PO Take 1 tablet by mouth daily.   amiodarone 200 MG tablet Commonly known as: PACERONE Take 1 tablet (200 mg total) by mouth daily.   atorvastatin 20 MG tablet Commonly known as: LIPITOR Take 1 tablet (20 mg total) by mouth every evening.   bisacodyl 10 MG suppository Commonly known as: DULCOLAX Place 1 suppository (10 mg total) rectally daily as needed for moderate constipation.   Comfort EZ Pen Needles 32G X 6 MM Misc Generic drug: Insulin Pen Needle 1 application by Does not apply route with breakfast, with lunch, and with evening meal.   docusate sodium 100 MG capsule Commonly known as: COLACE Take 1 capsule (100 mg total) by mouth daily.   ezetimibe 10 MG  tablet Commonly known as: ZETIA Take 10 mg by mouth daily.   hydrocortisone 5 MG tablet Commonly known as: CORTEF Take 15 mg in am (8; am) then take 5 mg 12;noon then take 5 mg at 16:00.  Daily.   insulin aspart 100 UNIT/ML FlexPen Commonly known as: NOVOLOG Inject 10 Units into the skin 3 (three) times daily with meals. What changed: how much to take   insulin detemir 100 UNIT/ML FlexPen Commonly known as: LEVEMIR Inject 40 Units into the skin 2 (two) times daily.   levothyroxine 75 MCG tablet Commonly known as: SYNTHROID Take 75 mcg by mouth daily.   melatonin 3 MG Tabs tablet Take 1 tablet (3 mg total) by mouth at bedtime as needed. What changed: reasons to take this   methocarbamol 750 MG tablet Commonly known as: ROBAXIN Take 1 tablet (750 mg total) by mouth every 6 (six) hours as needed for muscle spasms.   nortriptyline 50 MG capsule Commonly known as: PAMELOR Take 1 capsule (50 mg total) by mouth 2 (two) times daily.   omeprazole 20 MG capsule Commonly known as: PRILOSEC Take 20 mg by mouth 2 (two) times daily.   pantoprazole 40 MG tablet Commonly known as: PROTONIX Take 1 tablet (40 mg total) by mouth daily.   polyethylene glycol 17 g packet Commonly known as: MIRALAX / GLYCOLAX Take 17 g by mouth daily. What changed:  when to take this reasons to take  this   pregabalin 300 MG capsule Commonly known as: LYRICA Take 1 capsule (300 mg total) by mouth 2 (two) times daily.   rivaroxaban 20 MG Tabs tablet Commonly known as: XARELTO Take 1 tablet (20 mg total) by mouth daily with supper.   saccharomyces boulardii 250 MG capsule Commonly known as: FLORASTOR Take 1 capsule (250 mg total) by mouth 2 (two) times daily. What changed: when to take this   senna-docusate 8.6-50 MG tablet Commonly known as: Senokot-S Take 1 tablet by mouth 2 (two) times daily.   tamsulosin 0.4 MG Caps capsule Commonly known as: FLOMAX Take 0.4 mg by mouth daily.    VITAMIN A PO Take 1 capsule by mouth daily.   Vitamin D3 125 MCG (5000 UT) Tabs Take 5,000 Units by mouth daily.   white petrolatum Oint Commonly known as: VASELINE Apply 1 application topically daily. To both feet at nights and cover with socks What changed:  when to take this reasons to take this   witch hazel-glycerin pad Commonly known as: TUCKS Apply topically as needed for itching. What changed: how much to take   zinc gluconate 50 MG tablet Take 50 mg by mouth daily.       ASK your doctor about these medications    hydrocortisone 2.5 % rectal cream Commonly known as: ANUSOL-HC Place rectally 4 (four) times daily.               Durable Medical Equipment  (From admission, onward)           Start     Ordered   12/16/20 1324  For home use only DME oxygen  Once       Question Answer Comment  Length of Need Lifetime   Liters per Minute 2   Frequency Only at night (stationary unit needed)   Oxygen delivery system Gas      12/16/20 1326   12/11/20 1454  For home use only DME Shower stool  Once       Comments: Bariatric, weight is 478 pounds   12/11/20 1455              Discharge Care Instructions  (From admission, onward)           Start     Ordered   12/16/20 0000  Discharge wound care:       Comments: See above   12/16/20 1643            Discharge Assessment: Vitals:   12/16/20 1213 12/16/20 1700  BP: 121/63 105/66  Pulse: 94 99  Resp: 17 18  Temp: 98.3 F (36.8 C) 98.5 F (36.9 C)  SpO2: 97% 94%   Skin clean, dry and intact without evidence of skin break down, no evidence of skin tears noted. IV catheter discontinued intact. Site without signs and symptoms of complications - no redness or edema noted at insertion site, patient denies c/o pain - only slight tenderness at site.  Dressing with slight pressure applied.  D/c Instructions-Education: Discharge instructions given to patient/family with verbalized  understanding. D/c education completed with patient/family including follow up instructions, medication list, d/c activities limitations if indicated, with other d/c instructions as indicated by MD - patient able to verbalize understanding, all questions fully answered. Patient instructed to return to ED, call 911, or call MD for any changes in condition.  Patient escorted via WC, and D/C home via private auto.  Boykin Nearing, RN 12/16/2020 5:58 PM

## 2020-12-16 NOTE — Discharge Summary (Signed)
Physician Discharge Summary  Carl Hill:[id number] DOB: [birth year] DOA: 12/10/2020  PCP: Malka So., MD  Admit date: 12/10/2020 Discharge date: 12/16/2020  Admitted From: Home  Disposition:  Home   Recommendations for Outpatient Follow-up:  Follow up with PCP in 1-2 weeks Please obtain BMP/CBC in one week Needs to follow up with Endocrinologist for further evaluation of adrenal insufficiency.  Needs to follow up with cardiologist( medication discontinue during this admission due to hypotension: Cardizem, lisinopril.   Home Health: Yes.   Discharge Condition: Stable.  CODE STATUS: Full code Diet recommendation:Carb Modified   Brief/Interim Summary:  50 year old with past medical history significant for diabetes, hypertension, morbid obesity, A. fib on amiodarone and Xarelto, history of Fournier gangrene and sepsis earlier this year, and had a prolonged hospital course, history of sacral decubitus,  this has been healing.   Patient presents to the ED complaining of 3 near syncope episode occurred the afternoon of admission. On evaluation heart rate was 110 and systolic blood pressure was in the 80s.  In the ED that he had a temperature of 101.  Blood pressure in the ED as low as 69.     1-SIRS: -Patient presents with fever, tachycardia, tachypnea. -Sepsis was  ruled out  -No source for infection identified so far. -Blood cultures; no growth in the last 12 hours. -Urine culture; no growth. -Chest x-ray bilateral atelectasis.  Patient denies cough -CT abdomen and pelvis: No acute abnormality of the abdomen or pelvis.  Soft tissue thickening at the upper left gluteal cleft without fluid collection. -Discontinue Flagyl, Linezolid 7/14.  Discontinue Cefepime 7/15. Remain afebrile.    2-Hypotension: Orthostatic hypotension -Suspect this is related to adrenal insufficiency, vs component autonomic orthostatic hypotension. -Cortisol was low at 2.9. Patient was started on  hydrocortisone . Will arrange follow-up with endocrinologist for cosynthroipin test. Continue with hydrocortisone.  Plan for IV fluids, he might got dehydrated from multiples BM. abdominal binder. He is not able to tolerates ted hose Due to Heart history would like to avoid midodrine and florinef.  Tolerates working with PT today. Orthostatic vital improved.   3-AKI: Hold lisinopril Will plan to continue to hold lisinopril at discharge.   Hypertension: Continue to hold lisinopril   A. fib: Continue with Xarelto and amiodarone.  Hold Cardizem due to hypotension   Diabetes type 2: We will increase Levemir to 40 units twice daily and increase meal coverage to 10 units 3 times daily   Chronic pain; continue with lyrica and pamelor.   OSA; he has not been using CPAP at home. Patient qualifies for home oxygen needs.  Nocturnal Home oxygen arrange.  He will need repeat Sleep study, needs CPAP.    Estimated body mass index is 55.47 kg/m as calculated from the following:   Height as of this encounter:  (1.981 m).   Weight as of this encounter: 217.7 kg.     Discharge Diagnoses:  Active Problems:   AKI (acute kidney injury) (HCC)   Atrial fibrillation, chronic (HCC)   Essential hypertension   Chronic pain disorder   Morbid obesity with BMI of 60.0-69.9, adult (HCC)   DM2 (diabetes mellitus, type 2) (HCC)    Discharge Instructions  Discharge Instructions     Diet - low sodium heart healthy   Complete by: As directed    Discharge wound care:   Complete by: As directed    See above   Increase activity slowly   Complete by: As directed  Allergies as of 12/16/2020       Reactions   Bee Venom Anaphylaxis   Other reaction(s): Unknown Other reaction(s): Unknown Other reaction(s): Unknown   Sglt2 Inhibitors Other (See Comments)   Necrotizing infection of the perineum   Other Hives        Medication List     STOP taking these medications    diltiazem  240 MG 24 hr capsule Commonly known as: CARDIZEM CD   lisinopril 5 MG tablet Commonly known as: ZESTRIL   polycarbophil 625 MG tablet Commonly known as: FIBERCON       TAKE these medications    acetaminophen 325 MG tablet Commonly known as: TYLENOL Take 1-2 tablets (325-650 mg total) by mouth every 4 (four) hours as needed for mild pain.   albuterol (2.5 MG/3ML) 0.083% nebulizer solution Commonly known as: PROVENTIL Take 3 mLs (2.5 mg total) by nebulization every 4 (four) hours as needed for wheezing or shortness of breath.   ALIVE MENS ENERGY PO Take 1 tablet by mouth daily.   amiodarone 200 MG tablet Commonly known as: PACERONE Take 1 tablet (200 mg total) by mouth daily.   atorvastatin 20 MG tablet Commonly known as: LIPITOR Take 1 tablet (20 mg total) by mouth every evening.   bisacodyl 10 MG suppository Commonly known as: DULCOLAX Place 1 suppository (10 mg total) rectally daily as needed for moderate constipation.   Comfort EZ Pen Needles 32G X 6 MM Misc Generic drug: Insulin Pen Needle 1 application by Does not apply route with breakfast, with lunch, and with evening meal.   docusate sodium 100 MG capsule Commonly known as: COLACE Take 1 capsule (100 mg total) by mouth daily.   ezetimibe 10 MG tablet Commonly known as: ZETIA Take 10 mg by mouth daily.   hydrocortisone 5 MG tablet Commonly known as: CORTEF Take 15 mg in am (8; am) then take 5 mg 12;noon then take 5 mg at 16:00.  Daily.   insulin aspart 100 UNIT/ML FlexPen Commonly known as: NOVOLOG Inject 10 Units into the skin 3 (three) times daily with meals. What changed: how much to take   insulin detemir 100 UNIT/ML FlexPen Commonly known as: LEVEMIR Inject 40 Units into the skin 2 (two) times daily.   levothyroxine 75 MCG tablet Commonly known as: SYNTHROID Take 75 mcg by mouth daily.   melatonin 3 MG Tabs tablet Take 1 tablet (3 mg total) by mouth at bedtime as needed. What changed:  reasons to take this   methocarbamol 750 MG tablet Commonly known as: ROBAXIN Take 1 tablet (750 mg total) by mouth every 6 (six) hours as needed for muscle spasms.   nortriptyline 50 MG capsule Commonly known as: PAMELOR Take 1 capsule (50 mg total) by mouth 2 (two) times daily.   omeprazole 20 MG capsule Commonly known as: PRILOSEC Take 20 mg by mouth 2 (two) times daily.   pantoprazole 40 MG tablet Commonly known as: PROTONIX Take 1 tablet (40 mg total) by mouth daily.   polyethylene glycol 17 g packet Commonly known as: MIRALAX / GLYCOLAX Take 17 g by mouth daily. What changed:  when to take this reasons to take this   pregabalin 300 MG capsule Commonly known as: LYRICA Take 1 capsule (300 mg total) by mouth 2 (two) times daily.   rivaroxaban 20 MG Tabs tablet Commonly known as: XARELTO Take 1 tablet (20 mg total) by mouth daily with supper.   saccharomyces boulardii 250 MG capsule Commonly known as:  FLORASTOR Take 1 capsule (250 mg total) by mouth 2 (two) times daily. What changed: when to take this   senna-docusate 8.6-50 MG tablet Commonly known as: Senokot-S Take 1 tablet by mouth 2 (two) times daily.   tamsulosin 0.4 MG Caps capsule Commonly known as: FLOMAX Take 0.4 mg by mouth daily.   VITAMIN A PO Take 1 capsule by mouth daily.   Vitamin D3 125 MCG (5000 UT) Tabs Take 5,000 Units by mouth daily.   white petrolatum Oint Commonly known as: VASELINE Apply 1 application topically daily. To both feet at nights and cover with socks What changed:  when to take this reasons to take this   witch hazel-glycerin pad Commonly known as: TUCKS Apply topically as needed for itching. What changed: how much to take   zinc gluconate 50 MG tablet Take 50 mg by mouth daily.       ASK your doctor about these medications    hydrocortisone 2.5 % rectal cream Commonly known as: ANUSOL-HC Place rectally 4 (four) times daily.               Durable  Medical Equipment  (From admission, onward)           Start     Ordered   12/16/20 1324  For home use only DME oxygen  Once       Question Answer Comment  Length of Need Lifetime   Liters per Minute 2   Frequency Only at night (stationary unit needed)   Oxygen delivery system Gas      12/16/20 1326   12/11/20 1454  For home use only DME Shower stool  Once       Comments: Bariatric, weight is 478 pounds   12/11/20 1455              Discharge Care Instructions  (From admission, onward)           Start     Ordered   12/16/20 0000  Discharge wound care:       Comments: See above   12/16/20 1643            Follow-up Information     Talmage Coin, MD Follow up in 1 week(s).   Specialty: Endocrinology Contact information: [address] [place] [address] [phone]         [facility] Follow up.   Why: home oxygen Contact information: [address] [place] [address] [phone]         [facility] Follow up.   Why: resume HH services. they will contact you               Allergies  Allergen Reactions   Bee Venom Anaphylaxis    Other reaction(s): Unknown Other reaction(s): Unknown Other reaction(s): Unknown    Sglt2 Inhibitors Other (See Comments)    Necrotizing infection of the perineum   Other Hives    Consultations: None   Procedures/Studies: CT ABDOMEN PELVIS WO CONTRAST  Result Date: 12/11/2020 CLINICAL DATA:  Sepsis.  History of sacral decubitus ulcer EXAM: CT ABDOMEN AND PELVIS WITHOUT CONTRAST TECHNIQUE: Multidetector CT imaging of the abdomen and pelvis was performed following the standard protocol without IV contrast. COMPARISON:  08/02/2020 FINDINGS: LOWER CHEST: Normal. HEPATOBILIARY: Normal hepatic contours. No intra- or extrahepatic biliary dilatation. The gallbladder is normal. PANCREAS: Normal pancreas. No ductal dilatation or peripancreatic fluid collection. SPLEEN:  Normal. ADRENALS/URINARY TRACT: The adrenal glands are normal. No  hydronephrosis, nephroureterolithiasis or solid renal mass. The urinary bladder is normal for degree of distention STOMACH/BOWEL: Sleeve gastrectomy. No small bowel dilatation or inflammation. No focal colonic abnormality. Normal appendix. VASCULAR/LYMPHATIC: Normal course and caliber of the major abdominal vessels. No abdominal or pelvic lymphadenopathy. REPRODUCTIVE: Normal prostate size with symmetric seminal vesicles. MUSCULOSKELETAL. There is soft tissue thickening at the upper left gluteal cleft. No fluid collection. OTHER: None. IMPRESSION: 1. No acute abnormality of the abdomen or pelvis. 2. Soft tissue thickening at the upper left gluteal cleft without fluid collection. Electronically Signed   By: Deatra RobinsonKevin  Herman M.D.   On: 12/11/2020 02:42   DG Chest Port 1 View  Result Date: 12/10/2020 CLINICAL DATA:  Possible sepsis EXAM: PORTABLE CHEST 1 VIEW COMPARISON:  07/01/2020 FINDINGS: Cardiac shadow is stable. The lungs are hypoinflated. Right basilar atelectatic changes are noted. No focal confluent infiltrate is seen. No bony abnormality is noted. IMPRESSION: Mild right basilar atelectasis. Electronically Signed   By: Alcide CleverMark  Lukens M.D.   On: 12/10/2020 19:16     Subjective: He is feeling well, no new complaints.   Discharge Exam: Vitals:   12/16/20 0751 12/16/20 1213  BP: (!) 103/44 121/63  Pulse: 89 94  Resp: 18 17  Temp: 98.5 F (36.9 C) 98.3 F (36.8 C)  SpO2: 92% 97%     General: Pt is alert, awake, not in acute distress Cardiovascular: RRR, S1/S2 +, no rubs, no gallops Respiratory: CTA bilaterally, no wheezing, no rhonchi Abdominal: Soft, NT, ND, bowel sounds + Extremities: no edema, no cyanosis    The results of significant diagnostics from this hospitalization (including imaging, microbiology, ancillary and laboratory) are listed below for reference.     Microbiology: Recent Results (from the past 240  hour(s))  Resp Panel by RT-PCR (Flu A&B, Covid) Nasopharyngeal Swab     Status: None   Collection Time: 12/10/20  5:25 PM   Specimen: Nasopharyngeal Swab; Nasopharyngeal(NP) swabs in vial transport medium  Result Value Ref Range Status   SARS Coronavirus 2 by RT PCR NEGATIVE NEGATIVE Final    Comment: (NOTE) SARS-CoV-2 target nucleic acids are NOT DETECTED.  The SARS-CoV-2 RNA is generally detectable in upper respiratory specimens during the acute phase of infection. The lowest concentration of SARS-CoV-2 viral copies this assay can detect is 138 copies/mL. A negative result does not preclude SARS-Cov-2 infection and should not be used as the sole basis for treatment or other patient management decisions. A negative result may occur with  improper specimen collection/handling, submission of specimen other than nasopharyngeal swab, presence of viral mutation(s) within the areas targeted by this assay, and inadequate number of viral copies(<138 copies/mL). A negative result must be combined with clinical observations, patient history, and epidemiological information. The expected result is Negative.  Fact Sheet for Patients:  BloggerCourse.comhttps://www.fda.gov/media/152166/download  Fact Sheet for Healthcare Providers:  SeriousBroker.ithttps://www.fda.gov/media/152162/download  This test is no t yet approved or cleared by the [place] FDA and  has been authorized for detection and/or diagnosis of SARS-CoV-2 by FDA under an Emergency Use Authorization (EUA). This EUA will remain  in effect (meaning this test can be used) for the duration of the COVID-19 declaration under Section 564(b)(1) of the Act, 21 U.S.C.section 360bbb-3(b)(1), unless the authorization is terminated  or revoked sooner.       Influenza A by PCR NEGATIVE NEGATIVE Final   Influenza B by PCR NEGATIVE NEGATIVE Final    Comment: (NOTE) The Xpert Xpress SARS-CoV-2/FLU/RSV plus assay is intended as an aid in  the diagnosis of influenza from  Nasopharyngeal swab specimens and should not be used as a sole basis for treatment. Nasal washings and aspirates are unacceptable for Xpert Xpress SARS-CoV-2/FLU/RSV testing.  Fact Sheet for Patients: BloggerCourse.com  Fact Sheet for Healthcare Providers: SeriousBroker.it  This test is not yet approved or cleared by the [place] FDA and has been authorized for detection and/or diagnosis of SARS-CoV-2 by FDA under an Emergency Use Authorization (EUA). This EUA will remain in effect (meaning this test can be used) for the duration of the COVID-19 declaration under Section 564(b)(1) of the Act, 21 U.S.C. section 360bbb-3(b)(1), unless the authorization is terminated or revoked.  Performed at [facility], [address], [place], [place] [address]   Blood Culture (routine x 2)     Status: None   Collection Time: 12/10/20  6:00 PM   Specimen: BLOOD RIGHT ARM  Result Value Ref Range Status   Specimen Description BLOOD RIGHT ARM  Final   Special Requests   Final    BOTTLES DRAWN AEROBIC AND ANAEROBIC Blood Culture results may not be optimal due to an inadequate volume of blood received in culture bottles   Culture   Final    NO GROWTH 5 DAYS Performed at [facility], [address] [place], [place] [address]    Report Status 12/15/2020 FINAL  Final  Urine culture     Status: None   Collection Time: 12/10/20  7:55 PM   Specimen: In/Out Cath Urine  Result Value Ref Range Status   Specimen Description IN/OUT CATH URINE  Final   Special Requests NONE  Final   Culture   Final    NO GROWTH Performed at [facility], [address] [place], [place] [address]    Report Status 12/12/2020 FINAL  Final  Urine Culture     Status: None   Collection Time: 12/11/20  8:16 AM   Specimen: Urine, Random  Result Value Ref Range Status   Specimen Description URINE, RANDOM  Final   Special Requests NONE  Final    Culture   Final    NO GROWTH Performed at [facility], [address] [place], [place] [address]    Report Status 12/12/2020 FINAL  Final     Labs: BNP (last 3 results) No results for input(s): BNP in the last 8760 hours. Basic Metabolic Panel: Recent Labs  Lab 12/10/20 1725 12/10/20 1821 12/11/20 0351 12/12/20 0823 12/14/20 0944  NA 135 137 136 139 137  K 4.5 4.5 3.9 4.3 3.8  CL 103 104 104 105 102  CO2 21*  --  24 29 29   GLUCOSE 144* 129* 108* 140* 145*  BUN 20 21* 18 11 12   CREATININE 1.53* 1.60* 1.24 1.09 1.08  CALCIUM 8.8*  --  8.5* 8.5* 8.4*  MG  --   --   --  2.0  --    Liver Function Tests: Recent Labs  Lab 12/10/20 1725 12/11/20 0351  AST 23 20  ALT 18 17  ALKPHOS 68 57  BILITOT 0.7 0.8  PROT 7.0 6.1*  ALBUMIN 3.0* 2.6*   No results for input(s): LIPASE, AMYLASE in the last 168 hours. No results for input(s): AMMONIA in the last 168 hours. CBC: Recent Labs  Lab 12/10/20 1725 12/10/20 1821 12/11/20 0351 12/12/20 0823 12/14/20 0944  WBC 9.8  --  8.5 8.2 8.2  NEUTROABS 6.5  --   --   --   --  HGB 12.2* 11.9* 11.0* 11.6* 11.7*  HCT 39.7 35.0* 35.5* 37.2* 37.4*  MCV 81.0  --  80.5 80.9 81.5  PLT 231  --  186 181 184   Cardiac Enzymes: No results for input(s): CKTOTAL, CKMB, CKMBINDEX, TROPONINI in the last 168 hours. BNP: Invalid input(s): POCBNP CBG: Recent Labs  Lab 12/15/20 0955 12/15/20 1849 12/15/20 2145 12/16/20 0750 12/16/20 1212  GLUCAP 143* 150* 111* 115* 121*   D-Dimer No results for input(s): DDIMER in the last 72 hours. Hgb A1c No results for input(s): HGBA1C in the last 72 hours. Lipid Profile No results for input(s): CHOL, HDL, LDLCALC, TRIG, CHOLHDL, LDLDIRECT in the last 72 hours. Thyroid function studies No results for input(s): TSH, T4TOTAL, T3FREE, THYROIDAB in the last 72 hours.  Invalid input(s): FREET3 Anemia work up No results for input(s): VITAMINB12, FOLATE, FERRITIN, TIBC, IRON, RETICCTPCT in the  last 72 hours. Urinalysis    Component Value Date/Time   COLORURINE YELLOW 12/10/2020 2003   APPEARANCEUR CLEAR 12/10/2020 2003   LABSPEC 1.014 12/10/2020 2003   PHURINE 7.0 12/10/2020 2003   GLUCOSEU NEGATIVE 12/10/2020 2003   HGBUR NEGATIVE 12/10/2020 2003   BILIRUBINUR NEGATIVE 12/10/2020 2003   KETONESUR NEGATIVE 12/10/2020 2003   PROTEINUR NEGATIVE 12/10/2020 2003   NITRITE NEGATIVE 12/10/2020 2003   LEUKOCYTESUR NEGATIVE 12/10/2020 2003   Sepsis Labs Invalid input(s): PROCALCITONIN,  WBC,  LACTICIDVEN Microbiology Recent Results (from the past 240 hour(s))  Resp Panel by RT-PCR (Flu A&B, Covid) Nasopharyngeal Swab     Status: None   Collection Time: 12/10/20  5:25 PM   Specimen: Nasopharyngeal Swab; Nasopharyngeal(NP) swabs in vial transport medium  Result Value Ref Range Status   SARS Coronavirus 2 by RT PCR NEGATIVE NEGATIVE Final    Comment: (NOTE) SARS-CoV-2 target nucleic acids are NOT DETECTED.  The SARS-CoV-2 RNA is generally detectable in upper respiratory specimens during the acute phase of infection. The lowest concentration of SARS-CoV-2 viral copies this assay can detect is 138 copies/mL. A negative result does not preclude SARS-Cov-2 infection and should not be used as the sole basis for treatment or other patient management decisions. A negative result may occur with  improper specimen collection/handling, submission of specimen other than nasopharyngeal swab, presence of viral mutation(s) within the areas targeted by this assay, and inadequate number of viral copies(<138 copies/mL). A negative result must be combined with clinical observations, patient history, and epidemiological information. The expected result is Negative.  Fact Sheet for Patients:  BloggerCourse.com  Fact Sheet for Healthcare Providers:  SeriousBroker.it  This test is no t yet approved or cleared by the [place] FDA and  has  been authorized for detection and/or diagnosis of SARS-CoV-2 by FDA under an Emergency Use Authorization (EUA). This EUA will remain  in effect (meaning this test can be used) for the duration of the COVID-19 declaration under Section 564(b)(1) of the Act, 21 U.S.C.section 360bbb-3(b)(1), unless the authorization is terminated  or revoked sooner.       Influenza A by PCR NEGATIVE NEGATIVE Final   Influenza B by PCR NEGATIVE NEGATIVE Final    Comment: (NOTE) The Xpert Xpress SARS-CoV-2/FLU/RSV plus assay is intended as an aid in the diagnosis of influenza from Nasopharyngeal swab specimens and should not be used as a sole basis for treatment. Nasal washings and aspirates are unacceptable for Xpert Xpress SARS-CoV-2/FLU/RSV testing.  Fact Sheet for Patients: BloggerCourse.com  Fact Sheet for Healthcare Providers: SeriousBroker.it  This test is not yet approved or cleared by  the [employer] and has been authorized for detection and/or diagnosis of SARS-CoV-2 by FDA under an Emergency Use Authorization (EUA). This EUA will remain in effect (meaning this test can be used) for the duration of the COVID-19 declaration under Section 564(b)(1) of the Act, 21 U.S.C. section 360bbb-3(b)(1), unless the authorization is terminated or revoked.  Performed at [facility], [address], [place], [place] [address]   Blood Culture (routine x 2)     Status: None   Collection Time: 12/10/20  6:00 PM   Specimen: BLOOD RIGHT ARM  Result Value Ref Range Status   Specimen Description BLOOD RIGHT ARM  Final   Special Requests   Final    BOTTLES DRAWN AEROBIC AND ANAEROBIC Blood Culture results may not be optimal due to an inadequate volume of blood received in culture bottles   Culture   Final    NO GROWTH 5 DAYS Performed at [facility], [address] [place], [place] [address]    Report Status 12/15/2020 FINAL  Final   Urine culture     Status: None   Collection Time: 12/10/20  7:55 PM   Specimen: In/Out Cath Urine  Result Value Ref Range Status   Specimen Description IN/OUT CATH URINE  Final   Special Requests NONE  Final   Culture   Final    NO GROWTH Performed at [facility], [address] [place], [place] [address]    Report Status 12/12/2020 FINAL  Final  Urine Culture     Status: None   Collection Time: 12/11/20  8:16 AM   Specimen: Urine, Random  Result Value Ref Range Status   Specimen Description URINE, RANDOM  Final   Special Requests NONE  Final   Culture   Final    NO GROWTH Performed at [facility], [address] [place], [place] [address]    Report Status 12/12/2020 FINAL  Final     Time coordinating discharge: 40 minutes  SIGNED:   Alba Cory, MD  [facility]

## 2020-12-16 NOTE — Progress Notes (Signed)
Physical Therapy Treatment Patient Details Name: Carl Hill MRN: [id number] DOB: 1971-05-19 Today's Date: 12/16/2020    History of Present Illness 50yo male admitted 12/10/20 with near syncope. Admitted with sepsis. PMH A-fib, DM, HTN, abdominal surgery, I&D perineal wound, R heel wound, gastric sleeve resction, obesity    PT Comments    Pt supine in bed.  He is very frustrated on arrival.  He was agreeable to PT session and we were able to make good progress.  He remains orthostatic but asymptomatic and able to ambulate 140 ft with close chair follow for safety.  Informed nursing and MD of progress.    Orthostatic vitals: Supine:109/63 Sitting: 131/68 Standing: 94/69    Follow Up Recommendations  Home health PT;Supervision for mobility/OOB     Equipment Recommendations  Wheelchair (measurements PT);Wheelchair cushion (measurements PT);3in1 (PT) (bariatric)    Recommendations for Other Services       Precautions / Restrictions Precautions Precautions: Fall Precaution Comments: perineal wound, peripheral neuropathy, obesity Restrictions Weight Bearing Restrictions: No    Mobility  Bed Mobility Overal bed mobility: Needs Assistance Bed Mobility: Supine to Sit     Supine to sit: Modified independent (Device/Increase time)     General bed mobility comments: mattress deflated for supine to sit, pt able to come to EOB with momentum    Transfers Overall transfer level: Needs assistance Equipment used: Rolling walker (2 wheeled) (bariatric recliner.) Transfers: Sit to/from Stand Sit to Stand: Supervision         General transfer comment: Increased time and effort but no physical assistance needed.  Ambulation/Gait Ambulation/Gait assistance: Min guard;+2 safety/equipment (close chair follow due to syncope history.) Gait Distance (Feet): 140 Feet Assistive device: Rolling walker (2 wheeled) Gait Pattern/deviations: Step-through pattern;Wide base of  support;Trunk flexed     General Gait Details: Pt able to progress to gt trial in hall with close chair follow for safety.  Waddling pattern with cues to decrease BOS to stay inside device.  Pt with poor safety awareness but this is likely his baseline.  Watching feet during gt training.  Cues for obstacles negotiation and RW position.   Stairs             Wheelchair Mobility    Modified Rankin (Stroke Patients Only)       Balance Overall balance assessment: Needs assistance Sitting-balance support: No upper extremity supported;Feet supported Sitting balance-Leahy Scale: Good       Standing balance-Leahy Scale: Fair                              Cognition Arousal/Alertness: Awake/alert Behavior During Therapy: Impulsive;WFL for tasks assessed/performed;Agitated (agitated about hospitalization but pleasant to therapist) Overall Cognitive Status: Within Functional Limits for tasks assessed                                 General Comments: pt needs cues to remain on task during session, very laquacious      Exercises      General Comments        Pertinent Vitals/Pain Pain Assessment: Faces Faces Pain Scale: Hurts a little bit Pain Location: bottom/wound, pain in hands and feet Pain Descriptors / Indicators: Tender Pain Intervention(s): Monitored during session;Repositioned    Home Living                      Prior  Function            PT Goals (current goals can now be found in the care plan section) Acute Rehab PT Goals Patient Stated Goal: "get the hell out of here." Potential to Achieve Goals: Fair Progress towards PT goals: Progressing toward goals    Frequency    Min 3X/week      PT Plan Current plan remains appropriate    Co-evaluation              AM-PAC PT "6 Clicks" Mobility   Outcome Measure  Help needed turning from your back to your side while in a flat bed without using bedrails?:  None Help needed moving from lying on your back to sitting on the side of a flat bed without using bedrails?: None Help needed moving to and from a bed to a chair (including a wheelchair)?: A Little Help needed standing up from a chair using your arms (e.g., wheelchair or bedside chair)?: A Little Help needed to walk in hospital room?: A Little Help needed climbing 3-5 steps with a railing? : A Little 6 Click Score: 20    End of Session Equipment Utilized During Treatment: Gait belt Activity Tolerance: Patient tolerated treatment well Patient left: in bed;with call bell/phone within reach Nurse Communication: Mobility status (BP) PT Visit Diagnosis: Muscle weakness (generalized) (M62.81);Unsteadiness on feet (R26.81);Difficulty in walking, not elsewhere classified (R26.2);History of falling (Z91.81)     Time: 6578-4696 PT Time Calculation (min) (ACUTE ONLY): 31 min  Charges:  $Gait Training: 8-22 mins $Therapeutic Activity: 8-22 mins                     Bonney Leitz , PTA Acute Rehabilitation Services Pager [phone] Office [phone]    Mycheal Veldhuizen Artis Delay 12/16/2020, 4:58 PM

## 2020-12-16 NOTE — Progress Notes (Signed)
Overnight pulse ox study downloaded and copy placed in patients chart.

## 2021-01-09 ENCOUNTER — Telehealth: Payer: Self-pay | Admitting: *Deleted

## 2021-01-09 NOTE — Telephone Encounter (Signed)
Tresa Endo PT called to request extension for PT and initiate OT. I have let her know these orders will need to be given by PCP.  He never followed up in our office after discharge in April from inpt rehab and has just recently been in hospital again and was ordered PT but the hospitalist. Dr Synetta Fail is his PCP.

## 2021-01-22 ENCOUNTER — Telehealth: Payer: Self-pay

## 2021-01-22 NOTE — Telephone Encounter (Signed)
In-home physical therapy orders not granted. Requested by Connye Burkitt PT with [facility]. Stacie Glaze to contact PCP. (Chart reviewed Physical Medicine MD's did not order).

## 2021-03-07 DIAGNOSIS — I951 Orthostatic hypotension: Secondary | ICD-10-CM | POA: Insufficient documentation

## 2021-05-14 DIAGNOSIS — E2740 Unspecified adrenocortical insufficiency: Secondary | ICD-10-CM | POA: Diagnosis present

## 2021-07-10 ENCOUNTER — Other Ambulatory Visit: Payer: Self-pay | Admitting: Physical Medicine and Rehabilitation

## 2021-07-14 IMAGING — CT CT HEAD W/O CM
3 of 4 series · 15 of 47 positions shown, 18 images · non-contrast
Comparison: CT head 06/25/20

CLINICAL DATA: Dizziness, nonspecific, blurry vision.

EXAM:
CT HEAD WITHOUT CONTRAST
TECHNIQUE: Contiguous axial images were obtained from the base of the skull
through the vertex without intravenous contrast.

[Series 3: head 5.0 h30s · axial · 0.43mm/px · z∈[-232,-88]mm · 9 of 35 slices shown, 12 images]
[im 3/35  brain]
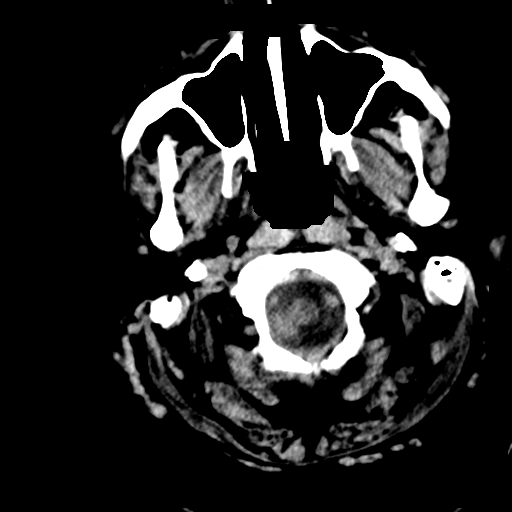
[im 3/35  bone]
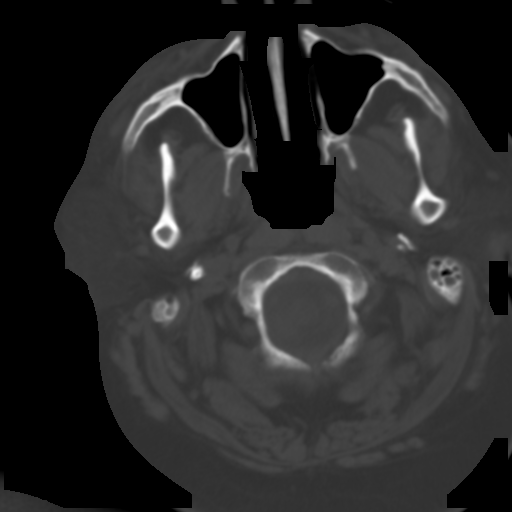
[im 8/35  brain]
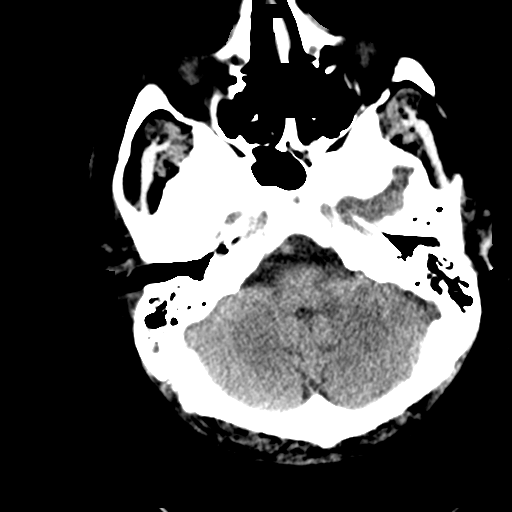
[im 10/35  brain]
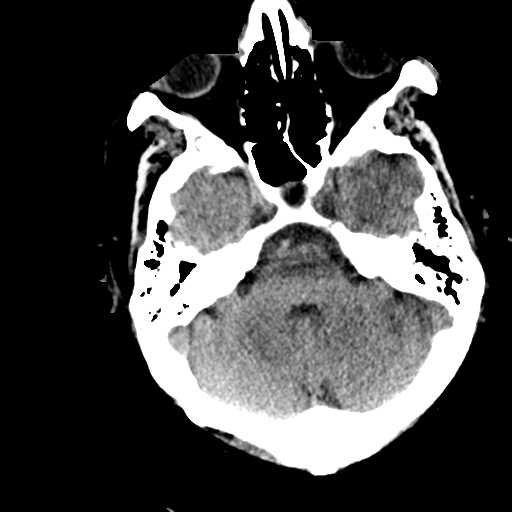
[im 15/35  brain]
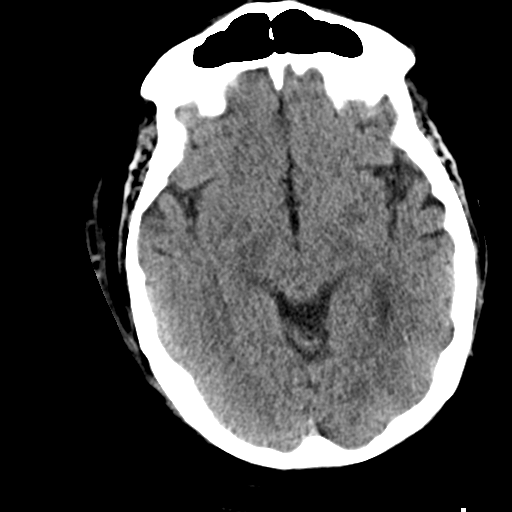
[im 18/35  brain]
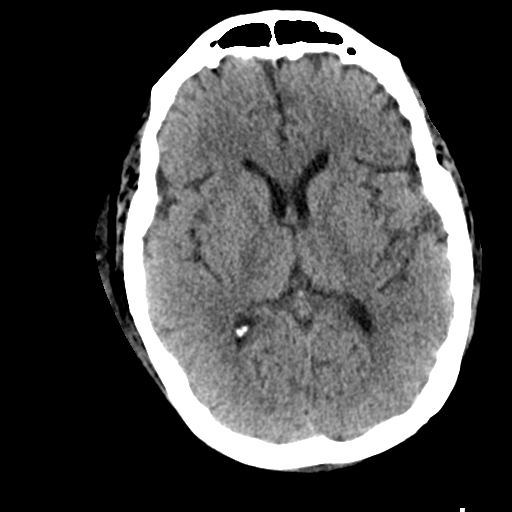
[im 18/35  bone]
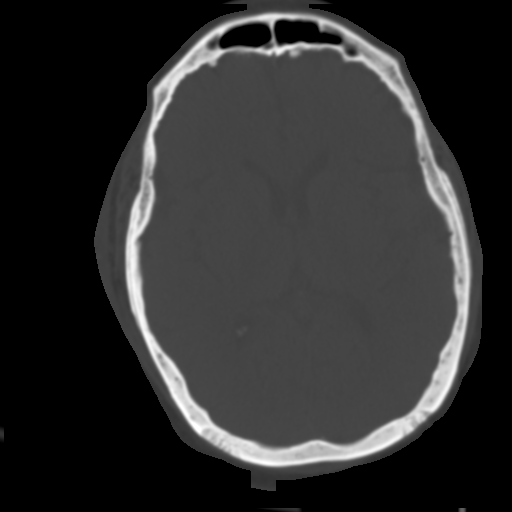
[im 20/35  brain]
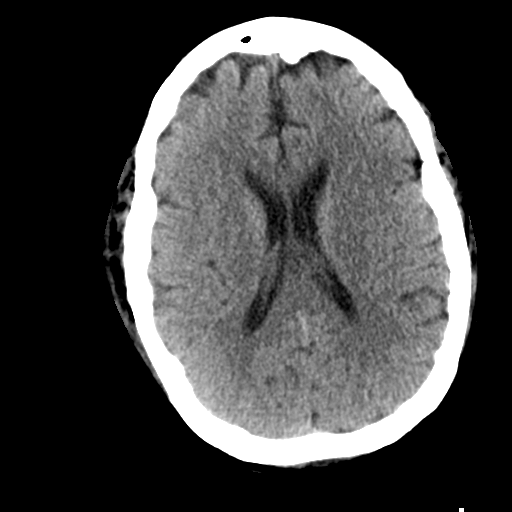
[im 25/35  brain]
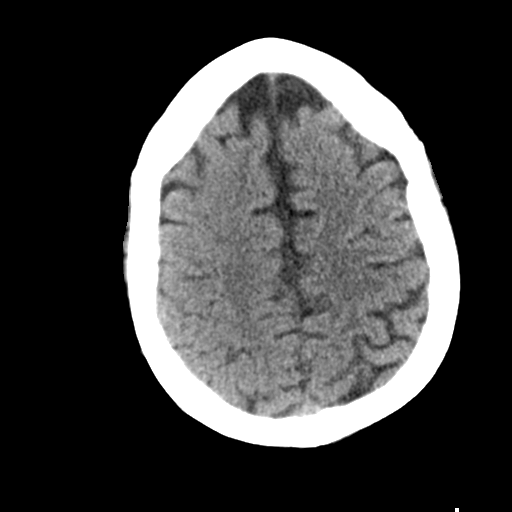
[im 27/35  brain]
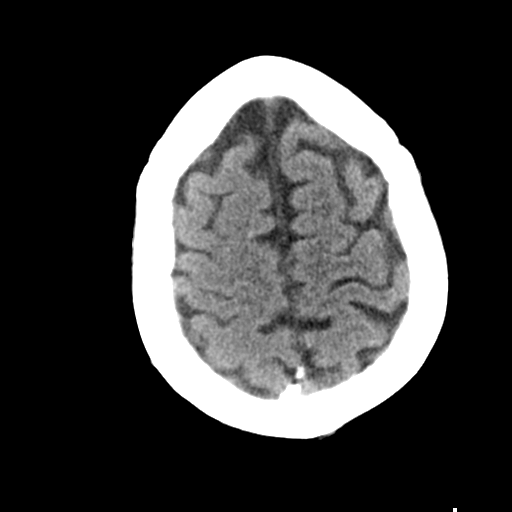
[im 32/35  brain]
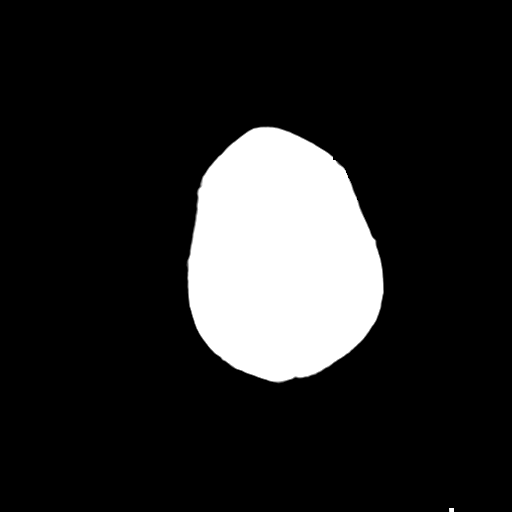
[im 32/35  bone]
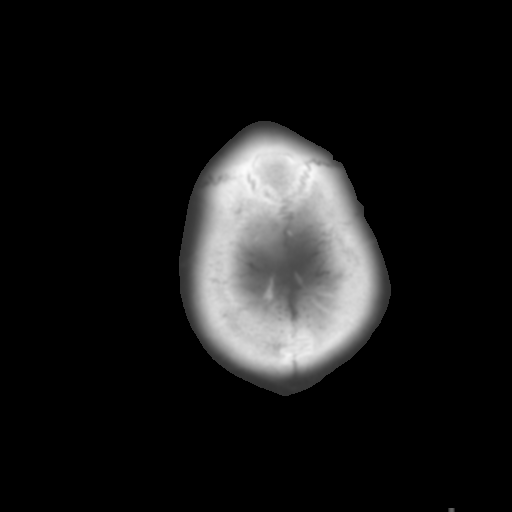

[Series 5: head 3.0 mpr cor · coronal · 0.34mm/px · 3 of 74 slices shown]
[im 25/74  brain]
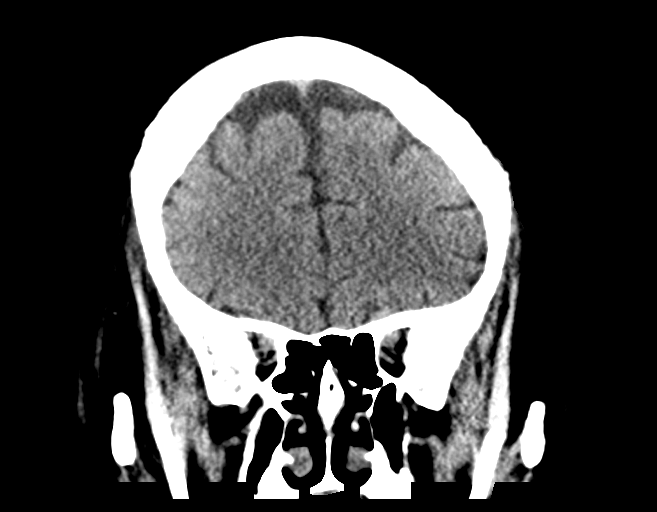
[im 33/74  brain]
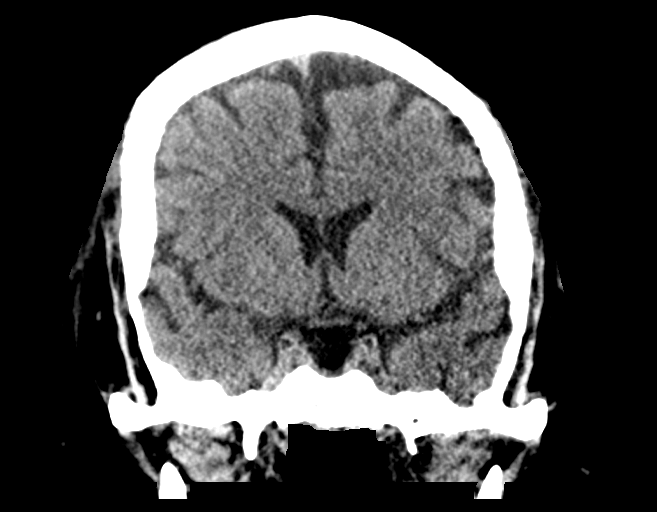
[im 41/74  brain]
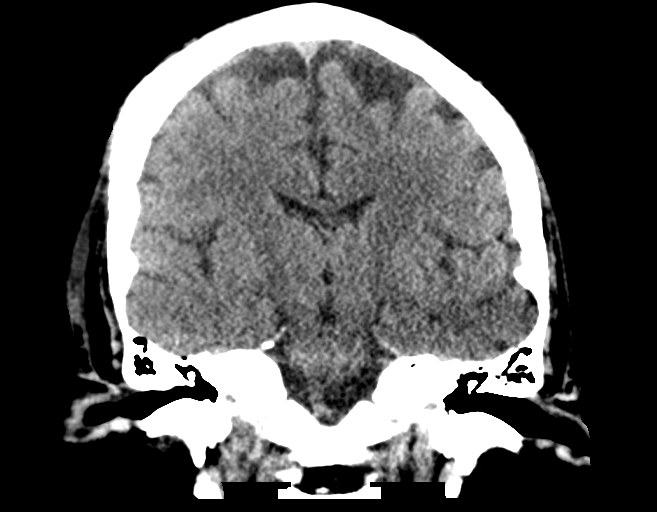

[Series 6: head 3.0 mpr sag · sagittal · 0.34mm/px · 3 of 65 slices shown]
[im 22/65  brain]
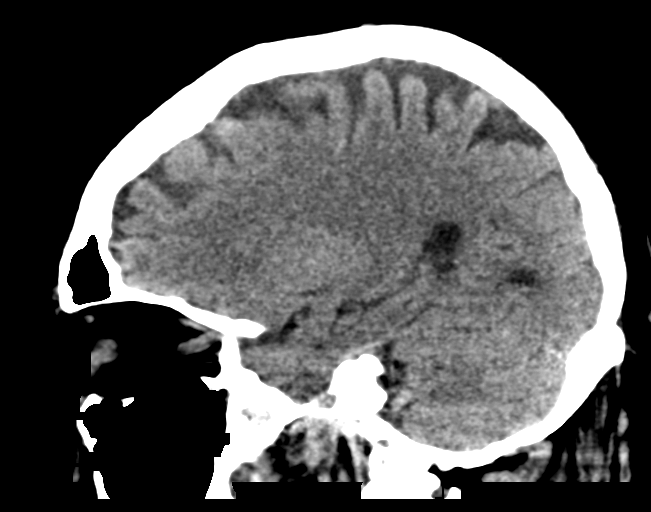
[im 33/65  brain]
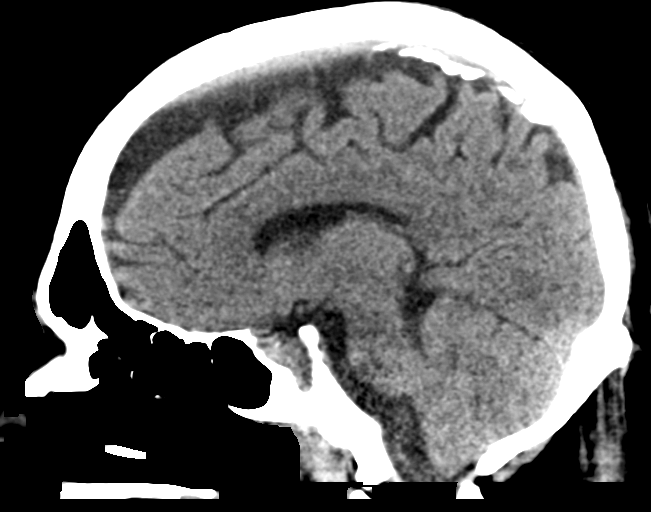
[im 43/65  brain]
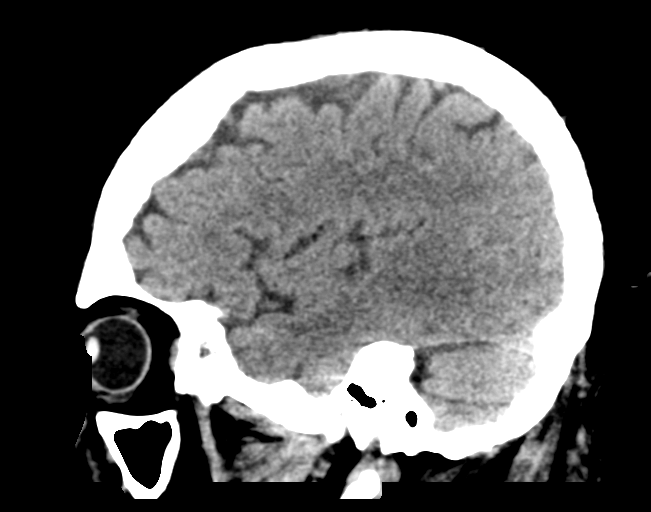

[15 of 47 positions shown; findings below may reference images not displayed]

FINDINGS: Brain:

No evidence of large-territorial acute infarction. No parenchymal
hemorrhage. No mass lesion. No extra-axial collection.

No mass effect or midline shift. No hydrocephalus. Basilar cisterns
are patent.

Vascular: No hyperdense vessel.

Skull: No acute fracture or focal lesion.

Sinuses/Orbits: Left sphenoid sinus mucosal thickening.
Redemonstration of bilateral mastoid air cell opacification.
Otherwise the remaining paranasal sinuses are clear. No fluid noted
within the middle ear. The orbits are unremarkable.

Other: None.
IMPRESSION: No acute intracranial abnormality.

## 2022-03-25 ENCOUNTER — Other Ambulatory Visit: Payer: Self-pay | Admitting: *Deleted

## 2022-03-25 DIAGNOSIS — J9600 Acute respiratory failure, unspecified whether with hypoxia or hypercapnia: Secondary | ICD-10-CM

## 2022-03-26 ENCOUNTER — Ambulatory Visit (INDEPENDENT_AMBULATORY_CARE_PROVIDER_SITE_OTHER): Payer: Medicare Other | Admitting: Internal Medicine

## 2022-03-26 DIAGNOSIS — J9600 Acute respiratory failure, unspecified whether with hypoxia or hypercapnia: Secondary | ICD-10-CM

## 2022-03-26 LAB — PULMONARY FUNCTION TEST
DL/VA % pred: 126 %
DL/VA: 5.71 ml/min/mmHg/L
DLCO cor % pred: 148 %
DLCO cor: 38.43 ml/min/mmHg
DLCO unc % pred: 148 %
DLCO unc: 38.43 ml/min/mmHg
FEF 25-75 Post: 2.36 L/sec
FEF 25-75 Pre: 2.36 L/sec
FEF2575-%Change-Post: 0 %
FEF2575-%Pred-Post: 75 %
FEF2575-%Pred-Pre: 75 %
FEV1-%Change-Post: 0 %
FEV1-%Pred-Post: 90 %
FEV1-%Pred-Pre: 90 %
FEV1-Post: 3.11 L
FEV1-Pre: 3.09 L
FEV1FVC-%Change-Post: 1 %
FEV1FVC-%Pred-Pre: 94 %
FEV6-%Change-Post: -1 %
FEV6-%Pred-Post: 98 %
FEV6-%Pred-Pre: 99 %
FEV6-Post: 4.19 L
FEV6-Pre: 4.23 L
FEV6FVC-%Change-Post: 0 %
FEV6FVC-%Pred-Post: 103 %
FEV6FVC-%Pred-Pre: 103 %
FVC-%Change-Post: 0 %
FVC-%Pred-Post: 95 %
FVC-%Pred-Pre: 96 %
FVC-Post: 4.21 L
FVC-Pre: 4.23 L
Post FEV1/FVC ratio: 74 %
Post FEV6/FVC ratio: 100 %
Pre FEV1/FVC ratio: 73 %
Pre FEV6/FVC Ratio: 100 %

## 2022-03-26 NOTE — Progress Notes (Signed)
Full PFT ordered but due to weight patient as not able to due Pleth. Spirometry pre/post and DLCO performed.

## 2022-03-26 NOTE — Patient Instructions (Signed)
Spirometry pre/post and dlco performed today. 

## 2022-04-09 ENCOUNTER — Institutional Professional Consult (permissible substitution) (HOSPITAL_BASED_OUTPATIENT_CLINIC_OR_DEPARTMENT_OTHER): Payer: Medicare Other | Admitting: Pulmonary Disease

## 2022-05-04 ENCOUNTER — Institutional Professional Consult (permissible substitution) (HOSPITAL_BASED_OUTPATIENT_CLINIC_OR_DEPARTMENT_OTHER): Payer: Medicare Other | Admitting: Pulmonary Disease

## 2022-06-02 ENCOUNTER — Institutional Professional Consult (permissible substitution) (HOSPITAL_BASED_OUTPATIENT_CLINIC_OR_DEPARTMENT_OTHER): Payer: Medicare Other | Admitting: Pulmonary Disease

## 2022-07-06 ENCOUNTER — Institutional Professional Consult (permissible substitution) (HOSPITAL_BASED_OUTPATIENT_CLINIC_OR_DEPARTMENT_OTHER): Payer: Medicare Other | Admitting: Pulmonary Disease

## 2022-08-04 ENCOUNTER — Institutional Professional Consult (permissible substitution) (HOSPITAL_BASED_OUTPATIENT_CLINIC_OR_DEPARTMENT_OTHER): Payer: Medicare Other | Admitting: Pulmonary Disease

## 2022-08-04 ENCOUNTER — Other Ambulatory Visit: Payer: Self-pay

## 2022-08-04 ENCOUNTER — Emergency Department (HOSPITAL_COMMUNITY): Payer: Medicare Other

## 2022-08-04 ENCOUNTER — Inpatient Hospital Stay (HOSPITAL_COMMUNITY)
Admission: EM | Admit: 2022-08-04 | Discharge: 2022-08-10 | DRG: 603 | Disposition: A | Discharge status: Home / Self Care | Payer: Medicare Other | Attending: Internal Medicine | Admitting: Internal Medicine

## 2022-08-04 ENCOUNTER — Encounter (HOSPITAL_COMMUNITY): Payer: Self-pay | Admitting: *Deleted

## 2022-08-04 DIAGNOSIS — Z6841 Body Mass Index (BMI) 40.0 and over, adult: Secondary | ICD-10-CM

## 2022-08-04 DIAGNOSIS — E2740 Unspecified adrenocortical insufficiency: Secondary | ICD-10-CM | POA: Diagnosis present

## 2022-08-04 DIAGNOSIS — Z7901 Long term (current) use of anticoagulants: Secondary | ICD-10-CM

## 2022-08-04 DIAGNOSIS — Z833 Family history of diabetes mellitus: Secondary | ICD-10-CM

## 2022-08-04 DIAGNOSIS — I10 Essential (primary) hypertension: Secondary | ICD-10-CM | POA: Diagnosis present

## 2022-08-04 DIAGNOSIS — Z23 Encounter for immunization: Secondary | ICD-10-CM

## 2022-08-04 DIAGNOSIS — E785 Hyperlipidemia, unspecified: Secondary | ICD-10-CM | POA: Diagnosis present

## 2022-08-04 DIAGNOSIS — Z91030 Bee allergy status: Secondary | ICD-10-CM

## 2022-08-04 DIAGNOSIS — E1165 Type 2 diabetes mellitus with hyperglycemia: Secondary | ICD-10-CM | POA: Diagnosis present

## 2022-08-04 DIAGNOSIS — Z800 Family history of malignant neoplasm of digestive organs: Secondary | ICD-10-CM

## 2022-08-04 DIAGNOSIS — I429 Cardiomyopathy, unspecified: Secondary | ICD-10-CM | POA: Diagnosis present

## 2022-08-04 DIAGNOSIS — S92351A Displaced fracture of fifth metatarsal bone, right foot, initial encounter for closed fracture: Secondary | ICD-10-CM | POA: Diagnosis present

## 2022-08-04 DIAGNOSIS — S92301A Fracture of unspecified metatarsal bone(s), right foot, initial encounter for closed fracture: Principal | ICD-10-CM

## 2022-08-04 DIAGNOSIS — Z794 Long term (current) use of insulin: Secondary | ICD-10-CM

## 2022-08-04 DIAGNOSIS — Z888 Allergy status to other drugs, medicaments and biological substances status: Secondary | ICD-10-CM

## 2022-08-04 DIAGNOSIS — G894 Chronic pain syndrome: Secondary | ICD-10-CM | POA: Diagnosis present

## 2022-08-04 DIAGNOSIS — Z9884 Bariatric surgery status: Secondary | ICD-10-CM

## 2022-08-04 DIAGNOSIS — G8929 Other chronic pain: Secondary | ICD-10-CM | POA: Diagnosis present

## 2022-08-04 DIAGNOSIS — J45909 Unspecified asthma, uncomplicated: Secondary | ICD-10-CM | POA: Diagnosis present

## 2022-08-04 DIAGNOSIS — L03115 Cellulitis of right lower limb: Principal | ICD-10-CM | POA: Diagnosis present

## 2022-08-04 DIAGNOSIS — E119 Type 2 diabetes mellitus without complications: Secondary | ICD-10-CM

## 2022-08-04 DIAGNOSIS — Z91119 Patient's noncompliance with dietary regimen due to unspecified reason: Secondary | ICD-10-CM

## 2022-08-04 DIAGNOSIS — S82839A Other fracture of upper and lower end of unspecified fibula, initial encounter for closed fracture: Secondary | ICD-10-CM

## 2022-08-04 DIAGNOSIS — Z7952 Long term (current) use of systemic steroids: Secondary | ICD-10-CM

## 2022-08-04 DIAGNOSIS — L0390 Cellulitis, unspecified: Secondary | ICD-10-CM

## 2022-08-04 DIAGNOSIS — W19XXXA Unspecified fall, initial encounter: Secondary | ICD-10-CM | POA: Diagnosis present

## 2022-08-04 DIAGNOSIS — Z803 Family history of malignant neoplasm of breast: Secondary | ICD-10-CM

## 2022-08-04 DIAGNOSIS — Z79890 Hormone replacement therapy (postmenopausal): Secondary | ICD-10-CM

## 2022-08-04 DIAGNOSIS — Z91199 Patient's noncompliance with other medical treatment and regimen due to unspecified reason: Secondary | ICD-10-CM

## 2022-08-04 DIAGNOSIS — E039 Hypothyroidism, unspecified: Secondary | ICD-10-CM | POA: Diagnosis present

## 2022-08-04 DIAGNOSIS — N179 Acute kidney failure, unspecified: Secondary | ICD-10-CM

## 2022-08-04 DIAGNOSIS — I482 Chronic atrial fibrillation, unspecified: Secondary | ICD-10-CM | POA: Diagnosis present

## 2022-08-04 DIAGNOSIS — Z79899 Other long term (current) drug therapy: Secondary | ICD-10-CM

## 2022-08-04 DIAGNOSIS — S82831A Other fracture of upper and lower end of right fibula, initial encounter for closed fracture: Secondary | ICD-10-CM | POA: Diagnosis present

## 2022-08-04 LAB — BASIC METABOLIC PANEL
Anion gap: 16 — ABNORMAL HIGH (ref 5–15)
BUN: 15 mg/dL (ref 6–20)
CO2: 20 mmol/L — ABNORMAL LOW (ref 22–32)
Calcium: 8.5 mg/dL — ABNORMAL LOW (ref 8.9–10.3)
Chloride: 102 mmol/L (ref 98–111)
Creatinine, Ser: 1.44 mg/dL — ABNORMAL HIGH (ref 0.61–1.24)
GFR, Estimated: 59 mL/min — ABNORMAL LOW (ref >60)
Glucose, Bld: 214 mg/dL — ABNORMAL HIGH (ref 70–99)
Potassium: 4.1 mmol/L (ref 3.5–5.1)
Sodium: 138 mmol/L (ref 135–145)

## 2022-08-04 LAB — CBC
HCT: 43.8 % (ref 39.0–52.0)
Hemoglobin: 13.9 g/dL (ref 13.0–17.0)
MCH: 26.9 pg (ref 26.0–34.0)
MCHC: 31.7 g/dL (ref 30.0–36.0)
MCV: 84.7 fL (ref 80.0–100.0)
Platelets: 228 10*3/uL (ref 150–400)
RBC: 5.17 MIL/uL (ref 4.22–5.81)
RDW: 16.7 % — ABNORMAL HIGH (ref 11.5–15.5)
WBC: 15.6 10*3/uL — ABNORMAL HIGH (ref 4.0–10.5)
nRBC: 0 % (ref 0.0–0.2)

## 2022-08-04 NOTE — ED Triage Notes (Signed)
Patient was walking to the bathroom and fell,  he reports loc for 2 min.  He has right sided pain and right knee pain.  Patient has abrasions to the forearm as well.  He is on xeralto.  Patient had recent BI bug but states this has been resolved since Monday.  Patient is diabetic, cbg 224.  Patient is alert upon arrival.  Patient states he tripped when he fell and passed out.

## 2022-08-04 NOTE — ED Provider Notes (Signed)
[facility] Provider Note   CSN: [id number] Arrival date & time: 08/04/22  2141     History {Add pertinent medical, surgical, social history, OB history to HPI:1} Chief Complaint  Patient presents with   Carl Hill is a 52 y.o. male.   Fall   Patient has a history of multiple medical problems including diabetes, atrial fibrillation, hypertension, asthma, morbid obesity, cardiomyopathy who presents to the ED after a fall.  Patient states he was at home today.  He was walking when his shoe caught on something and he ended up falling forward.  Patient is having pain in his knee and his lower leg.  He is also having pain in his ankle.  He denies hitting his head or having a headache.  However since his fall patient's had a couple of syncopal episodes lasting a few seconds.  He is felt lightheaded.  Patient had an episode in the ED where he was rolling over and briefly passed out.  He denies any chest pain.  No abdominal pain.  He denies any focal numbness or weakness.  Patient states he was able to stand somewhat initially while using his walker    Home Medications Prior to Admission medications   Medication Sig Start Date End Date Taking? Authorizing Provider  acetaminophen (TYLENOL) 325 MG tablet Take 1-2 tablets (325-650 mg total) by mouth every 4 (four) hours as needed for mild pain. 09/06/20   Love, Ivan Anchors, PA-C  albuterol (PROVENTIL) (2.5 MG/3ML) 0.083% nebulizer solution Take 3 mLs (2.5 mg total) by nebulization every 4 (four) hours as needed for wheezing or shortness of breath. 08/01/20   Samella Parr, NP  amiodarone (PACERONE) 200 MG tablet Take 1 tablet (200 mg total) by mouth daily. 09/12/20   Love, Ivan Anchors, PA-C  atorvastatin (LIPITOR) 20 MG tablet Take 1 tablet (20 mg total) by mouth every evening. 09/12/20   Love, Ivan Anchors, PA-C  bisacodyl (DULCOLAX) 10 MG suppository Place 1 suppository (10 mg total) rectally  daily as needed for moderate constipation. 09/06/20   Love, Ivan Anchors, PA-C  Cholecalciferol (VITAMIN D3) 125 MCG (5000 UT) TABS Take 5,000 Units by mouth daily.    [provider]  docusate sodium (COLACE) 100 MG capsule Take 1 capsule (100 mg total) by mouth daily. 09/12/20   Love, Ivan Anchors, PA-C  ezetimibe (ZETIA) 10 MG tablet Take 10 mg by mouth daily.    [provider]  hydrocortisone (ANUSOL-HC) 2.5 % rectal cream Place rectally 4 (four) times daily. Patient taking differently: Place 1 application rectally 4 (four) times daily as needed for hemorrhoids or anal itching. 09/12/20   Love, Ivan Anchors, PA-C  hydrocortisone (CORTEF) 5 MG tablet Take 15 mg in am (8; am) then take 5 mg 12;noon then take 5 mg at 16:00.  Daily. 12/16/20   Regalado, Belkys A, MD  insulin aspart (NOVOLOG) 100 UNIT/ML FlexPen Inject 10 Units into the skin 3 (three) times daily with meals. 09/12/20   Love, Ivan Anchors, PA-C  insulin detemir (LEVEMIR) 100 UNIT/ML FlexPen Inject 40 Units into the skin 2 (two) times daily. 09/12/20   Love, Ivan Anchors, PA-C  Insulin Pen Needle (COMFORT EZ PEN NEEDLES) 32G X 6 MM MISC 1 application by Does not apply route with breakfast, with lunch, and with evening meal. 09/12/20   Love, Ivan Anchors, PA-C  levothyroxine (SYNTHROID) 75 MCG tablet Take 75 mcg  by mouth daily. 04/03/20   [provider]  melatonin 3 MG TABS tablet Take 1 tablet (3 mg total) by mouth at bedtime as needed. 08/01/20   Samella Parr, NP  methocarbamol (ROBAXIN) 750 MG tablet Take 1 tablet (750 mg total) by mouth every 6 (six) hours as needed for muscle spasms. 09/12/20   Love, Ivan Anchors, PA-C  Multiple Vitamins-Minerals (ALIVE MENS ENERGY PO) Take 1 tablet by mouth daily.    [provider]  nortriptyline (PAMELOR) 50 MG capsule Take 1 capsule (50 mg total) by mouth 2 (two) times daily. 09/12/20   Love, Ivan Anchors, PA-C  omeprazole (PRILOSEC) 20 MG capsule Take 20 mg by mouth 2 (two) times daily. 10/07/20    [provider]  pantoprazole (PROTONIX) 40 MG tablet Take 1 tablet (40 mg total) by mouth daily. 09/12/20   Love, Ivan Anchors, PA-C  polyethylene glycol (MIRALAX / GLYCOLAX) 17 g packet Take 17 g by mouth daily. 09/12/20   Love, Ivan Anchors, PA-C  pregabalin (LYRICA) 300 MG capsule Take 1 capsule (300 mg total) by mouth 2 (two) times daily. 09/12/20   Love, Ivan Anchors, PA-C  rivaroxaban (XARELTO) 20 MG TABS tablet Take 1 tablet (20 mg total) by mouth daily with supper. 09/12/20   Love, Ivan Anchors, PA-C  saccharomyces boulardii (FLORASTOR) 250 MG capsule Take 1 capsule (250 mg total) by mouth 2 (two) times daily. 08/05/20   Johnson, Clanford L, MD  senna-docusate (SENOKOT-S) 8.6-50 MG tablet Take 1 tablet by mouth 2 (two) times daily. 09/12/20   Love, Ivan Anchors, PA-C  tamsulosin (FLOMAX) 0.4 MG CAPS capsule Take 0.4 mg by mouth daily. 10/24/20   [provider]  VITAMIN A PO Take 1 capsule by mouth daily.    [provider]  white petrolatum (VASELINE) OINT Apply 1 application topically daily. To both feet at nights and cover with socks 09/06/20   Love, Ivan Anchors, PA-C  witch hazel-glycerin (TUCKS) pad Apply topically as needed for itching. 09/06/20   Love, Ivan Anchors, PA-C  zinc gluconate 50 MG tablet Take 50 mg by mouth daily.    [provider]      Allergies    Bee venom, Sglt2 inhibitors, and Other    Review of Systems   Review of Systems  Physical Exam Updated Vital Signs BP 139/70 (BP Location: Left Arm)   Pulse (!) 124   Resp (!) 25   Wt (!) 235.9 kg   SpO2 94%   BMI 60.09 kg/m  Physical Exam Vitals and nursing note reviewed.  Constitutional:      Appearance: He is well-developed. He is obese. He is not diaphoretic.  HENT:     Head: Normocephalic and atraumatic.     Right Ear: External ear normal.     Left Ear: External ear normal.  Eyes:     General: No scleral icterus.       Right eye: No discharge.        Left eye: No discharge.     Conjunctiva/sclera:  Conjunctivae normal.  Neck:     Trachea: No tracheal deviation.  Cardiovascular:     Rate and Rhythm: Normal rate and regular rhythm.  Pulmonary:     Effort: Pulmonary effort is normal. No respiratory distress.     Breath sounds: Normal breath sounds. No stridor. No wheezing or rales.  Abdominal:     General: Bowel sounds are normal. There is no distension.     Palpations: Abdomen is  soft.     Tenderness: There is no abdominal tenderness. There is no guarding or rebound.  Musculoskeletal:        General: No deformity.     Cervical back: Neck supple. No tenderness.     Right knee: Swelling and bony tenderness present.     Right lower leg: Swelling and tenderness present. No deformity. Edema present.     Comments: Contusion swelling hematoma right lower leg  Skin:    General: Skin is warm and dry.     Findings: No rash.  Neurological:     General: No focal deficit present.     Mental Status: He is alert.     Cranial Nerves: No cranial nerve deficit, dysarthria or facial asymmetry.     Sensory: No sensory deficit.     Motor: No abnormal muscle tone or seizure activity.     Coordination: Coordination normal.  Psychiatric:        Mood and Affect: Mood normal.     ED Results / Procedures / Treatments   Labs (all labs ordered are listed, but only abnormal results are displayed) Labs Reviewed - No data to display  EKG None  Radiology No results found.  Procedures Procedures  {Document cardiac monitor, telemetry assessment procedure when appropriate:1}  Medications Ordered in ED Medications - No data to display  ED Course/ Medical Decision Making/ A&P   {   Click here for ABCD2, HEART and other calculatorsREFRESH Note before signing :1}                          Medical Decision Making Amount and/or Complexity of Data Reviewed Labs: ordered. Radiology: ordered.   ***  {Document critical care time when appropriate:1} {Document review of labs and clinical decision  tools ie heart score, Chads2Vasc2 etc:1}  {Document your independent review of radiology images, and any outside records:1} {Document your discussion with family members, caretakers, and with consultants:1} {Document social determinants of health affecting pt's care:1} {Document your decision making why or why not admission, treatments were needed:1} Final Clinical Impression(s) / ED Diagnoses Final diagnoses:  None    Rx / DC Orders ED Discharge Orders     None

## 2022-08-05 ENCOUNTER — Inpatient Hospital Stay (HOSPITAL_COMMUNITY): Payer: Medicare Other

## 2022-08-05 ENCOUNTER — Emergency Department (HOSPITAL_COMMUNITY): Payer: Medicare Other

## 2022-08-05 ENCOUNTER — Encounter (HOSPITAL_COMMUNITY): Payer: Self-pay | Admitting: Internal Medicine

## 2022-08-05 DIAGNOSIS — S82831A Other fracture of upper and lower end of right fibula, initial encounter for closed fracture: Secondary | ICD-10-CM

## 2022-08-05 DIAGNOSIS — L03115 Cellulitis of right lower limb: Secondary | ICD-10-CM | POA: Diagnosis present

## 2022-08-05 DIAGNOSIS — Z79899 Other long term (current) drug therapy: Secondary | ICD-10-CM | POA: Diagnosis not present

## 2022-08-05 DIAGNOSIS — Z794 Long term (current) use of insulin: Secondary | ICD-10-CM | POA: Diagnosis not present

## 2022-08-05 DIAGNOSIS — W19XXXA Unspecified fall, initial encounter: Secondary | ICD-10-CM | POA: Diagnosis present

## 2022-08-05 DIAGNOSIS — Z91119 Patient's noncompliance with dietary regimen due to unspecified reason: Secondary | ICD-10-CM | POA: Diagnosis not present

## 2022-08-05 DIAGNOSIS — E2740 Unspecified adrenocortical insufficiency: Secondary | ICD-10-CM | POA: Diagnosis not present

## 2022-08-05 DIAGNOSIS — S82839A Other fracture of upper and lower end of unspecified fibula, initial encounter for closed fracture: Secondary | ICD-10-CM

## 2022-08-05 DIAGNOSIS — Z91030 Bee allergy status: Secondary | ICD-10-CM | POA: Diagnosis not present

## 2022-08-05 DIAGNOSIS — Z888 Allergy status to other drugs, medicaments and biological substances status: Secondary | ICD-10-CM | POA: Diagnosis not present

## 2022-08-05 DIAGNOSIS — G894 Chronic pain syndrome: Secondary | ICD-10-CM

## 2022-08-05 DIAGNOSIS — S92351A Displaced fracture of fifth metatarsal bone, right foot, initial encounter for closed fracture: Secondary | ICD-10-CM | POA: Diagnosis present

## 2022-08-05 DIAGNOSIS — I429 Cardiomyopathy, unspecified: Secondary | ICD-10-CM | POA: Diagnosis present

## 2022-08-05 DIAGNOSIS — E785 Hyperlipidemia, unspecified: Secondary | ICD-10-CM

## 2022-08-05 DIAGNOSIS — E1165 Type 2 diabetes mellitus with hyperglycemia: Secondary | ICD-10-CM | POA: Diagnosis present

## 2022-08-05 DIAGNOSIS — I482 Chronic atrial fibrillation, unspecified: Secondary | ICD-10-CM

## 2022-08-05 DIAGNOSIS — I10 Essential (primary) hypertension: Secondary | ICD-10-CM

## 2022-08-05 DIAGNOSIS — Z9884 Bariatric surgery status: Secondary | ICD-10-CM | POA: Diagnosis not present

## 2022-08-05 DIAGNOSIS — N179 Acute kidney failure, unspecified: Secondary | ICD-10-CM | POA: Diagnosis present

## 2022-08-05 DIAGNOSIS — Z833 Family history of diabetes mellitus: Secondary | ICD-10-CM | POA: Diagnosis not present

## 2022-08-05 DIAGNOSIS — E039 Hypothyroidism, unspecified: Secondary | ICD-10-CM | POA: Diagnosis not present

## 2022-08-05 DIAGNOSIS — Z7952 Long term (current) use of systemic steroids: Secondary | ICD-10-CM | POA: Diagnosis not present

## 2022-08-05 DIAGNOSIS — Z23 Encounter for immunization: Secondary | ICD-10-CM | POA: Diagnosis present

## 2022-08-05 DIAGNOSIS — Z6841 Body Mass Index (BMI) 40.0 and over, adult: Secondary | ICD-10-CM | POA: Diagnosis not present

## 2022-08-05 DIAGNOSIS — G8929 Other chronic pain: Secondary | ICD-10-CM | POA: Diagnosis present

## 2022-08-05 DIAGNOSIS — J45909 Unspecified asthma, uncomplicated: Secondary | ICD-10-CM | POA: Diagnosis present

## 2022-08-05 LAB — GLUCOSE, CAPILLARY
Glucose-Capillary: 285 mg/dL — ABNORMAL HIGH (ref 70–99)
Glucose-Capillary: 232 mg/dL — ABNORMAL HIGH (ref 70–99)

## 2022-08-05 LAB — HIV ANTIBODY (ROUTINE TESTING W REFLEX): HIV Screen 4th Generation wRfx: NONREACTIVE

## 2022-08-05 LAB — MAGNESIUM: Magnesium: 1.6 mg/dL — ABNORMAL LOW (ref 1.7–2.4)

## 2022-08-05 LAB — TROPONIN I (HIGH SENSITIVITY): Troponin I (High Sensitivity): 11 ng/L (ref <18)

## 2022-08-05 LAB — HEPATIC FUNCTION PANEL
ALT: 17 U/L (ref 0–44)
AST: 29 U/L (ref 15–41)
Albumin: 2.8 g/dL — ABNORMAL LOW (ref 3.5–5.0)
Alkaline Phosphatase: 76 U/L (ref 38–126)
Bilirubin, Direct: 0.1 mg/dL (ref 0.0–0.2)
Indirect Bilirubin: 0.3 mg/dL (ref 0.3–0.9)
Total Bilirubin: 0.4 mg/dL (ref 0.3–1.2)
Total Protein: 7 g/dL (ref 6.5–8.1)

## 2022-08-05 LAB — LACTIC ACID, PLASMA: Lactic Acid, Venous: 3.4 mmol/L (ref 0.5–1.9)

## 2022-08-05 LAB — BRAIN NATRIURETIC PEPTIDE: B Natriuretic Peptide: 11.1 pg/mL (ref 0.0–100.0)

## 2022-08-05 LAB — HEMOGLOBIN A1C
Hgb A1c MFr Bld: 8 % — ABNORMAL HIGH (ref 4.8–5.6)
Mean Plasma Glucose: 183 mg/dL

## 2022-08-05 MED ORDER — TAMSULOSIN HCL 0.4 MG PO CAPS
0.4000 mg | ORAL_CAPSULE | Freq: Every day | ORAL | Status: DC
  Administered 2022-08-05 – 2022-08-10 (×6): 0.4 mg via ORAL
  Filled 2022-08-05 (×6): qty 1

## 2022-08-05 MED ORDER — PANTOPRAZOLE SODIUM 40 MG PO TBEC
40.0000 mg | DELAYED_RELEASE_TABLET | Freq: Every day | ORAL | Status: DC
  Administered 2022-08-05 – 2022-08-10 (×6): 40 mg via ORAL
  Filled 2022-08-05 (×6): qty 1

## 2022-08-05 MED ORDER — DULOXETINE HCL 20 MG PO CPEP
20.0000 mg | ORAL_CAPSULE | Freq: Two times a day (BID) | ORAL | Status: DC
  Administered 2022-08-05 – 2022-08-10 (×10): 20 mg via ORAL
  Filled 2022-08-05 (×12): qty 1

## 2022-08-05 MED ORDER — SACCHAROMYCES BOULARDII 250 MG PO CAPS
250.0000 mg | ORAL_CAPSULE | Freq: Two times a day (BID) | ORAL | Status: DC
  Administered 2022-08-05 – 2022-08-10 (×10): 250 mg via ORAL
  Filled 2022-08-05 (×11): qty 1

## 2022-08-05 MED ORDER — ONDANSETRON HCL 4 MG PO TABS: 4.0000 mg | ORAL_TABLET | Freq: Four times a day (QID) | ORAL | Status: DC | PRN

## 2022-08-05 MED ORDER — HYDROCERIN EX CREA: TOPICAL_CREAM | Freq: Two times a day (BID) | CUTANEOUS | Status: DC

## 2022-08-05 MED ORDER — INSULIN ASPART 100 UNIT/ML IJ SOLN
0.0000 [IU] | Freq: Every day | INTRAMUSCULAR | Status: DC
  Administered 2022-08-05 – 2022-08-07 (×3): 2 [IU] via SUBCUTANEOUS

## 2022-08-05 MED ORDER — LACTATED RINGERS IV BOLUS
1000.0000 mL | Freq: Once | INTRAVENOUS | Status: AC
  Administered 2022-08-05: 1000 mL via INTRAVENOUS

## 2022-08-05 MED ORDER — PREGABALIN 100 MG PO CAPS
200.0000 mg | ORAL_CAPSULE | Freq: Three times a day (TID) | ORAL | Status: DC
  Administered 2022-08-05 – 2022-08-10 (×15): 200 mg via ORAL
  Filled 2022-08-05 (×15): qty 2

## 2022-08-05 MED ORDER — SODIUM CHLORIDE 0.9 % IV BOLUS
500.0000 mL | Freq: Once | INTRAVENOUS | Status: AC
  Administered 2022-08-05: 500 mL via INTRAVENOUS

## 2022-08-05 MED ORDER — PREDNISONE 5 MG PO TABS
5.0000 mg | ORAL_TABLET | Freq: Every day | ORAL | Status: DC
  Administered 2022-08-06 – 2022-08-10 (×5): 5 mg via ORAL
  Filled 2022-08-05 (×5): qty 1

## 2022-08-05 MED ORDER — DILTIAZEM HCL ER COATED BEADS 240 MG PO CP24
240.0000 mg | ORAL_CAPSULE | Freq: Every day | ORAL | Status: DC
  Administered 2022-08-05 – 2022-08-10 (×6): 240 mg via ORAL
  Filled 2022-08-05 (×6): qty 1

## 2022-08-05 MED ORDER — NORTRIPTYLINE HCL 25 MG PO CAPS
75.0000 mg | ORAL_CAPSULE | Freq: Two times a day (BID) | ORAL | Status: DC
  Administered 2022-08-05 – 2022-08-10 (×10): 75 mg via ORAL
  Filled 2022-08-05 (×11): qty 3

## 2022-08-05 MED ORDER — LEVOTHYROXINE SODIUM 75 MCG PO TABS
75.0000 ug | ORAL_TABLET | Freq: Every day | ORAL | Status: DC
  Administered 2022-08-05 – 2022-08-06 (×2): 75 ug via ORAL
  Filled 2022-08-05 (×2): qty 1

## 2022-08-05 MED ORDER — EZETIMIBE 10 MG PO TABS
10.0000 mg | ORAL_TABLET | Freq: Every day | ORAL | Status: DC
  Administered 2022-08-05 – 2022-08-10 (×6): 10 mg via ORAL
  Filled 2022-08-05 (×6): qty 1

## 2022-08-05 MED ORDER — CEFAZOLIN SODIUM-DEXTROSE 2-4 GM/100ML-% IV SOLN
2.0000 g | Freq: Three times a day (TID) | INTRAVENOUS | Status: DC
  Administered 2022-08-05 – 2022-08-10 (×15): 2 g via INTRAVENOUS
  Filled 2022-08-05 (×15): qty 100

## 2022-08-05 MED ORDER — CEFAZOLIN SODIUM-DEXTROSE 1-4 GM/50ML-% IV SOLN
1.0000 g | Freq: Once | INTRAVENOUS | Status: AC
  Administered 2022-08-05: 1 g via INTRAVENOUS
  Filled 2022-08-05: qty 50

## 2022-08-05 MED ORDER — ATORVASTATIN CALCIUM 10 MG PO TABS
20.0000 mg | ORAL_TABLET | Freq: Every evening | ORAL | Status: DC
  Administered 2022-08-05 – 2022-08-09 (×5): 20 mg via ORAL
  Filled 2022-08-05 (×5): qty 2

## 2022-08-05 MED ORDER — SODIUM CHLORIDE 0.9 % IV SOLN: INTRAVENOUS | Status: AC

## 2022-08-05 MED ORDER — INSULIN ASPART 100 UNIT/ML IJ SOLN
0.0000 [IU] | Freq: Three times a day (TID) | INTRAMUSCULAR | Status: DC
  Administered 2022-08-05: 8 [IU] via SUBCUTANEOUS
  Administered 2022-08-06 (×3): 5 [IU] via SUBCUTANEOUS
  Administered 2022-08-07 (×2): 8 [IU] via SUBCUTANEOUS
  Administered 2022-08-07 – 2022-08-08 (×2): 5 [IU] via SUBCUTANEOUS
  Administered 2022-08-08: 8 [IU] via SUBCUTANEOUS
  Administered 2022-08-08 – 2022-08-09 (×2): 5 [IU] via SUBCUTANEOUS
  Administered 2022-08-09: 3 [IU] via SUBCUTANEOUS
  Administered 2022-08-09: 8 [IU] via SUBCUTANEOUS
  Administered 2022-08-10: 3 [IU] via SUBCUTANEOUS

## 2022-08-05 MED ORDER — OXYCODONE-ACETAMINOPHEN 5-325 MG PO TABS
1.0000 | ORAL_TABLET | Freq: Once | ORAL | Status: AC
  Administered 2022-08-05: 1 via ORAL
  Filled 2022-08-05: qty 1

## 2022-08-05 MED ORDER — INSULIN GLARGINE-YFGN 100 UNIT/ML ~~LOC~~ SOLN
50.0000 [IU] | Freq: Two times a day (BID) | SUBCUTANEOUS | Status: DC
  Administered 2022-08-05 – 2022-08-07 (×4): 50 [IU] via SUBCUTANEOUS
  Filled 2022-08-05 (×6): qty 0.5

## 2022-08-05 MED ORDER — IOHEXOL 350 MG/ML SOLN
100.0000 mL | Freq: Once | INTRAVENOUS | Status: AC | PRN
  Administered 2022-08-05: 100 mL via INTRAVENOUS

## 2022-08-05 MED ORDER — ACETAMINOPHEN 325 MG PO TABS
650.0000 mg | ORAL_TABLET | Freq: Four times a day (QID) | ORAL | Status: DC | PRN
  Administered 2022-08-05 – 2022-08-10 (×6): 650 mg via ORAL
  Filled 2022-08-05 (×6): qty 2

## 2022-08-05 MED ORDER — INFLUENZA VAC SPLIT QUAD 0.5 ML IM SUSY
0.5000 mL | PREFILLED_SYRINGE | INTRAMUSCULAR | Status: AC
  Administered 2022-08-10: 0.5 mL via INTRAMUSCULAR
  Filled 2022-08-05 (×2): qty 0.5

## 2022-08-05 MED ORDER — ONDANSETRON HCL 4 MG/2ML IJ SOLN: 4.0000 mg | Freq: Four times a day (QID) | INTRAMUSCULAR | Status: DC | PRN

## 2022-08-05 MED ORDER — ACETAMINOPHEN 650 MG RE SUPP: 650.0000 mg | Freq: Four times a day (QID) | RECTAL | Status: DC | PRN

## 2022-08-05 MED ORDER — AMIODARONE HCL 200 MG PO TABS
200.0000 mg | ORAL_TABLET | Freq: Every day | ORAL | Status: DC
  Administered 2022-08-05 – 2022-08-10 (×6): 200 mg via ORAL
  Filled 2022-08-05 (×6): qty 1

## 2022-08-05 MED ORDER — RIVAROXABAN 20 MG PO TABS
20.0000 mg | ORAL_TABLET | Freq: Every day | ORAL | Status: DC
  Administered 2022-08-05 – 2022-08-09 (×5): 20 mg via ORAL
  Filled 2022-08-05 (×5): qty 1

## 2022-08-05 MED ORDER — NYSTATIN 100000 UNIT/GM EX POWD: 1.0000 | Freq: Three times a day (TID) | CUTANEOUS | Status: DC | PRN

## 2022-08-05 MED ORDER — OXYCODONE HCL 5 MG PO TABS
5.0000 mg | ORAL_TABLET | ORAL | Status: AC | PRN
  Administered 2022-08-05 – 2022-08-06 (×2): 5 mg via ORAL
  Filled 2022-08-05 (×3): qty 1

## 2022-08-05 MED ORDER — LABETALOL HCL 5 MG/ML IV SOLN: 10.0000 mg | INTRAVENOUS | Status: DC | PRN

## 2022-08-05 MED ORDER — PREGABALIN 100 MG PO CAPS: 200.0000 mg | ORAL_CAPSULE | Freq: Three times a day (TID) | ORAL | Status: DC

## 2022-08-05 MED ORDER — INSULIN ASPART 100 UNIT/ML IJ SOLN
10.0000 [IU] | Freq: Three times a day (TID) | INTRAMUSCULAR | Status: DC
  Administered 2022-08-05 – 2022-08-08 (×8): 10 [IU] via SUBCUTANEOUS

## 2022-08-05 MED ORDER — NYSTATIN 100000 UNIT/GM EX POWD
Freq: Once | CUTANEOUS | Status: AC
  Filled 2022-08-05: qty 15

## 2022-08-05 MED ORDER — HYDROCERIN EX CREA
TOPICAL_CREAM | Freq: Every day | CUTANEOUS | Status: DC
  Filled 2022-08-05: qty 113

## 2022-08-05 NOTE — ED Notes (Signed)
Critical lactic 3.4. provider aware

## 2022-08-05 NOTE — Assessment & Plan Note (Addendum)
Admit to med/tele bed. IV Abx with Ancef. Concerned there is a hematoma on the lateral aspect of left upper leg. Will check CT right leg//foot to evaluate for hematoma and abscess.

## 2022-08-05 NOTE — Assessment & Plan Note (Addendum)
Continue with prednisone 5 mg daily. If he becomes hypotensive, give IV solumedrol 60 mg or IV solucortef 100 mg.

## 2022-08-05 NOTE — Progress Notes (Signed)
Orthopedic Tech Progress Note Patient Details:  Carl Hill 21-Apr-1971 [id number]  Ortho Devices Type of Ortho Device: CAM walker Ortho Device/Splint Location: rle Ortho Device/Splint Interventions: Ordered, Application, Adjustment  Fit and left in room Post Interventions Patient Tolerated: Well Instructions Provided: Care of device, Adjustment of device  Karolee Stamps 08/05/2022, 2:31 AM

## 2022-08-05 NOTE — Assessment & Plan Note (Signed)
Continue lipitor 20 mg and zetia 10 mg daily.

## 2022-08-05 NOTE — Assessment & Plan Note (Signed)
Continue with meal time insulin. Pt states he takes tresiba 100 units daily. Will change over to lantus and split the dose.

## 2022-08-05 NOTE — Assessment & Plan Note (Signed)
Continue lyrica 200 mg tid

## 2022-08-05 NOTE — ED Notes (Signed)
Pt assisted to side of bed to use urinal 

## 2022-08-05 NOTE — ED Provider Notes (Signed)
Care of patient assumed from Dr. Tomi Bamberger.  This patient presents from home following a fall.  Although he describes a mechanical fall, he reportedly had a syncopal.  Afterwards.  He is on Xarelto.  He is awaiting imaging studies at this time.  He has been found to have distal fibular fracture.  Lab work shows AKI and leukocytosis. Physical Exam  BP 139/70 (BP Location: Left Arm)   Pulse (!) 124   Temp 98.1 F (36.7 C) (Oral)   Resp (!) 25   Ht '6\' 6"'$  (1.981 m)   Wt (!) 235.9 kg   SpO2 94%   BMI 60.09 kg/m   Physical Exam  Procedures  Procedures  ED Course / MDM   Clinical Course as of 08/05/22 0002  Tue Aug 04, 2022  2345 CBC(!) White blood cell count shows leukocytosis [JK]  [id number] Basic metabolic panel(!) Creatiine elevated compared to last [JK]  2347 DG Foot Complete Right X-ray shows 5 metatarsal fracture [JK]  2347 Fracture noted in the knee [JK]  2348 Nondisplaced distal fibula fracture noted [JK]    Clinical Course User Index [JK] Dorie Rank, MD   Medical Decision Making Amount and/or Complexity of Data Reviewed Labs: ordered. Decision-making details documented in ED Course. Radiology: ordered. Decision-making details documented in ED Course.  Risk Prescription drug management.   ***

## 2022-08-05 NOTE — ED Notes (Signed)
Xeroform and kerlex applied to pts right lower leg per wound care nurse

## 2022-08-05 NOTE — ED Notes (Signed)
..ED TO INPATIENT HANDOFF REPORT  ED Nurse Name and Phone #: [phone]   S Name/Age/Gender Carl Hill 52 y.o. male Room/Bed: 036C/036C  Code Status   Code Status: Full Code  Home/SNF/Other Home Patient oriented to: self, place, time, and situation Is this baseline? Yes      Chief Complaint Cellulitis of right leg [id number]  Triage Note Patient was walking to the bathroom and fell,  he reports loc for 2 min.  He has right sided pain and right knee pain.  Patient has abrasions to the forearm as well.  He is on xeralto.  Patient had recent BI bug but states this has been resolved since Monday.  Patient is diabetic, cbg 224.  Patient is alert upon arrival.  Patient states he tripped when he fell and passed out.     Allergies Allergies  Allergen Reactions   Bee Venom Anaphylaxis    Other reaction(s): Unknown Other reaction(s): Unknown Other reaction(s): Unknown    Sglt2 Inhibitors Other (See Comments)    Necrotizing infection of the perineum   Other Hives    Level of Care/Admitting Diagnosis ED Disposition     ED Disposition  Admit   Condition  --   [place]: [facility] [[id number]]  Level of Care: Telemetry Medical [104]  May admit patient to [facility] or [facility] if equivalent level of care is available:: No  Covid Evaluation: Asymptomatic - no recent exposure (last 10 days) testing not required  Diagnosis: Cellulitis of right leg [id number]  Admitting Physician: Bridgett Larsson, [place]  Attending Physician: Bridgett Larsson, ERIC [id number]  Certification:: I certify this patient will need inpatient services for at least 2 midnights  Estimated Length of Stay: 4          B Medical/Surgery History 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   Asthma    Atrial fibrillation ([place])    Diabetes mellitus without complication ([place])    Fournier gangrene 06/10/2020   History of cardioversion    3 times    Hypertension     Sepsis due to Streptococcus, group B ([place]) 06/22/2020   Past Surgical History:  Procedure Laterality Date   ABDOMINAL SURGERY     INCISION AND DRAINAGE PERIRECTAL ABSCESS N/A 06/11/2020   Procedure: IRRIGATION AND DEBRIDEMENT PERINEAL WOUND;  Surgeon: Dwan Bolt, MD;  Location: [facility];  Service: General;  Laterality: N/A;   IRRIGATION AND DEBRIDEMENT ABSCESS N/A 06/10/2020   Procedure: IRRIGATION AND DEBRIDEMENT ABSCESS;  Surgeon: Dwan Bolt, MD;  Location: [facility];  Service: General;  Laterality: N/A;   LAPAROSCOPIC GASTRIC SLEEVE RESECTION       A IV Location/Drains/Wounds Patient Lines/Drains/Airways Status     Active Line/Drains/Airways     Name Placement date Placement time Site Days   Peripheral IV 08/04/22 20 G Anterior;Left Hand 08/04/22  2249  Hand  1   Wound / Incision (Open or Dehisced) 08/06/20 Buttocks 08/06/20  0249  Buttocks  729   Wound / Incision (Open or Dehisced) 12/11/20 Other (Comment) Anus Right;Left Pink moist and clean 12/11/20  1322  Anus  602            Intake/Output Last 24 hours No intake or output data in the 24 hours ending 08/05/22 1208  Labs/Imaging Results for orders placed or performed during the hospital encounter of 08/04/22 (from the past 48 hour(s))  CBC     Status: Abnormal   Collection Time: 08/04/22 11:00  PM  Result Value Ref Range   WBC 15.6 (H) 4.0 - 10.5 K/uL   RBC 5.17 4.22 - 5.81 MIL/uL   Hemoglobin 13.9 13.0 - 17.0 g/dL   HCT 43.8 39.0 - 52.0 %   MCV 84.7 80.0 - 100.0 fL   MCH 26.9 26.0 - 34.0 pg   MCHC 31.7 30.0 - 36.0 g/dL   RDW 16.7 (H) 11.5 - 15.5 %   Platelets 228 150 - 400 K/uL   nRBC 0.0 0.0 - 0.2 %    Comment: Performed at [facility] [address], [place], [place] [id number]  Basic metabolic panel     Status: Abnormal   Collection Time: 08/04/22 11:00 PM  Result Value Ref Range   Sodium 138 135 - 145 mmol/L   Potassium 4.1 3.5 - 5.1 mmol/L    Comment: HEMOLYSIS AT THIS LEVEL MAY AFFECT  RESULT HEMOLYSIS AT THIS LEVEL MAY AFFECT RESULT    Chloride 102 98 - 111 mmol/L   CO2 20 (L) 22 - 32 mmol/L   Glucose, Bld 214 (H) 70 - 99 mg/dL    Comment: Glucose reference range applies only to samples taken after fasting for at least 8 hours.   BUN 15 6 - 20 mg/dL   Creatinine, Ser 1.44 (H) 0.61 - 1.24 mg/dL   Calcium 8.5 (L) 8.9 - 10.3 mg/dL   GFR, Estimated 59 (L) >60 mL/min    Comment: (NOTE) Calculated using the CKD-EPI Creatinine Equation (2021)    Anion gap 16 (H) 5 - 15    Comment: Performed at [facility] [address], [place], [place] [address]  Hepatic function panel     Status: Abnormal   Collection Time: 08/05/22  1:55 AM  Result Value Ref Range   Total Protein 7.0 6.5 - 8.1 g/dL   Albumin 2.8 (L) 3.5 - 5.0 g/dL   AST 29 15 - 41 U/L   ALT 17 0 - 44 U/L   Alkaline Phosphatase 76 38 - 126 U/L   Total Bilirubin 0.4 0.3 - 1.2 mg/dL   Bilirubin, Direct 0.1 0.0 - 0.2 mg/dL   Indirect Bilirubin 0.3 0.3 - 0.9 mg/dL    Comment: Performed at [facility] [address], [place], [place] [address]  Magnesium     Status: Abnormal   Collection Time: 08/05/22  1:55 AM  Result Value Ref Range   Magnesium 1.6 (L) 1.7 - 2.4 mg/dL    Comment: Performed at [facility] [address], [place], [place] [address]  Lactic acid, plasma     Status: Abnormal   Collection Time: 08/05/22  1:55 AM  Result Value Ref Range   Lactic Acid, Venous 3.4 (HH) 0.5 - 1.9 mmol/L    Comment: CRITICAL RESULT CALLED TO, READ BACK BY AND VERIFIED WITH Olene Craven,. RN, [phone], 08/05/22, EADEDOKUN Performed at [facility], [place] [address] [place], [place] [address]   Troponin I (High Sensitivity)     Status: None   Collection Time: 08/05/22  1:55 AM  Result Value Ref Range   Troponin I (High Sensitivity) 11 <18 ng/L    Comment: (NOTE) Elevated high sensitivity troponin I (hsTnI) values and significant  changes across serial measurements may suggest ACS but many other   chronic and acute conditions are known to elevate hsTnI results.  Refer to the "Links" section for chest pain algorithms and additional  guidance. Performed at [facility], [place]  [address] [place], [place] [address]   Brain natriuretic peptide     Status: None   Collection Time: 08/05/22  1:55 AM  Result Value Ref Range   B Natriuretic Peptide 11.1 0.0 - 100.0 pg/mL    Comment: Performed at [facility] [address], [place], [place] [address]   CT FOOT RIGHT W CONTRAST  Result Date: 08/05/2022 CLINICAL DATA:  Right foot swelling. Diabetic with osteomyelitis suspected. EXAM: CT OF THE LOWER RIGHT EXTREMITY WITH CONTRAST TECHNIQUE: Multidetector CT imaging of the lower right extremity was performed from the distal femoral diametaphysis through the foot according to the standard protocol following intravenous contrast administration. RADIATION DOSE REDUCTION: This exam was performed according to the departmental dose-optimization program which includes automated exposure control, adjustment of the mA and/or kV according to patient size and/or use of iterative reconstruction technique. CONTRAST:  176m OMNIPAQUE IOHEXOL 350 MG/ML SOLN COMPARISON:  Right ankle and foot films from yesterday. FINDINGS: Bones/Joint/Cartilage There is diffuse osteopenia. Acute or least recent transverse nondisplaced fracture of the lateral malleolar tip is noted, centered anteriorly with a small chip fracture off the posterior aspect of the lateral malleolar tip, the latter distracted a few mm from the parent bone. In the foot, there is a recent nondisplaced transverse fracture extending AP through the medial aspect of the cuboid bone, and a recent nondisplaced oblique fracture of the distal fifth metatarsal shaft which was also noted on the plain radiographs. There are no apparent joint effusions at the knee and ankle and no further evidence of fractures. No erosive or destructive bone lesion is seen  suspicious for acute osteomyelitis. There is tricompartmental degenerative arthrosis of the knee, with joint space loss greatest along the medial femorotibial joint and the lateral patellofemoral joint, with small tricompartmental marginal spurs. At the ankle, there is narrowing along the medial tibiotalar joint most likely due to cartilage loss, and mild midfoot arthrosis as well as hammertoe deformities of the second through fifth toes and mild nonerosive degenerative arthrosis of the forefoot. Ligaments Suboptimally assessed by CT. Muscles and Tendons There is moderate partial fatty atrophy in the distal thigh musculature and superior foreleg musculature and advanced marked fatty atrophy of the mid to distal foreleg muscles and intrinsic foot muscles. Area tendons are not well seen with this technique but grossly intact, as visualized. Soft tissues Subcutaneously in the anterior upper foreleg there is high density fluid measuring 77 Hounsfield units collecting subcutaneously most likely representing a hematoma measuring 13.4 cm transverse, 1.7 cm AP, and 8.9 cm craniocaudal. There is diffuse edema in the ankle and foot, at the ankle is greatest laterally and over the foot is greatest superiorly. There is additional dense fluid collecting subcutaneously at the lateral ankle and also is most likely due to subcutaneous hematoma, estimated to measure 7.9 cm AP, 13.1 cm coronal and 6.6 cm craniocaudal. There are scattered phleboliths in the anteromedial soft tissues of the foreleg and laterally at the level of the ankle. Also noted is a raised skin lesion of indeterminate etiology in the anterior aspect of the distal foreleg, measuring 5.7 cm craniocaudal, 4.4 cm transversely and 0.6 cm height, and could be a blister but is nonspecific. There is no evidence of an abscess, soft tissue gas, or soft tissue mass. IMPRESSION: 1. Osteopenia and degenerative changes. 2. No erosive or destructive bone lesion is seen  suspicious for acute osteomyelitis. 3. Acute or least recent transverse nondisplaced fracture of the lateral malleolar tip with a small chip  fracture off the posterior aspect of the lateral malleolar tip. 4. Recent nondisplaced transverse fracture extending AP through the medial aspect of the cuboid bone, and recent nondisplaced oblique fracture of the distal fifth metatarsal shaft. 5. Subcutaneous hematomas in the anterior upper foreleg and lateral ankle. 6. Diffuse edema in the ankle and foot. 7. 5.7 x 4.4 x 0.6 cm raised skin lesion in the anterior aspect of the distal foreleg, nonspecific. 8. Marked fatty atrophy of the mid to distal foreleg muscles and intrinsic foot muscles. Electronically Signed   By: Telford Nab M.D.   On: 08/05/2022 07:11   CT TIBIA FIBULA RIGHT W CONTRAST  Result Date: 08/05/2022 CLINICAL DATA:  Right foot swelling. Diabetic with osteomyelitis suspected. EXAM: CT OF THE LOWER RIGHT EXTREMITY WITH CONTRAST TECHNIQUE: Multidetector CT imaging of the lower right extremity was performed from the distal femoral diametaphysis through the foot according to the standard protocol following intravenous contrast administration. RADIATION DOSE REDUCTION: This exam was performed according to the departmental dose-optimization program which includes automated exposure control, adjustment of the mA and/or kV according to patient size and/or use of iterative reconstruction technique. CONTRAST:  164m OMNIPAQUE IOHEXOL 350 MG/ML SOLN COMPARISON:  Right ankle and foot films from yesterday. FINDINGS: Bones/Joint/Cartilage There is diffuse osteopenia. Acute or least recent transverse nondisplaced fracture of the lateral malleolar tip is noted, centered anteriorly with a small chip fracture off the posterior aspect of the lateral malleolar tip, the latter distracted a few mm from the parent bone. In the foot, there is a recent nondisplaced transverse fracture extending AP through the medial aspect of the  cuboid bone, and a recent nondisplaced oblique fracture of the distal fifth metatarsal shaft which was also noted on the plain radiographs. There are no apparent joint effusions at the knee and ankle and no further evidence of fractures. No erosive or destructive bone lesion is seen suspicious for acute osteomyelitis. There is tricompartmental degenerative arthrosis of the knee, with joint space loss greatest along the medial femorotibial joint and the lateral patellofemoral joint, with small tricompartmental marginal spurs. At the ankle, there is narrowing along the medial tibiotalar joint most likely due to cartilage loss, and mild midfoot arthrosis as well as hammertoe deformities of the second through fifth toes and mild nonerosive degenerative arthrosis of the forefoot. Ligaments Suboptimally assessed by CT. Muscles and Tendons There is moderate partial fatty atrophy in the distal thigh musculature and superior foreleg musculature and advanced marked fatty atrophy of the mid to distal foreleg muscles and intrinsic foot muscles. Area tendons are not well seen with this technique but grossly intact, as visualized. Soft tissues Subcutaneously in the anterior upper foreleg there is high density fluid measuring 77 Hounsfield units collecting subcutaneously most likely representing a hematoma measuring 13.4 cm transverse, 1.7 cm AP, and 8.9 cm craniocaudal. There is diffuse edema in the ankle and foot, at the ankle is greatest laterally and over the foot is greatest superiorly. There is additional dense fluid collecting subcutaneously at the lateral ankle and also is most likely due to subcutaneous hematoma, estimated to measure 7.9 cm AP, 13.1 cm coronal and 6.6 cm craniocaudal. There are scattered phleboliths in the anteromedial soft tissues of the foreleg and laterally at the level of the ankle. Also noted is a raised skin lesion of indeterminate etiology in the anterior aspect of the distal foreleg, measuring 5.7  cm craniocaudal, 4.4 cm transversely and 0.6 cm height, and could be a blister but is nonspecific. There is  no evidence of an abscess, soft tissue gas, or soft tissue mass. IMPRESSION: 1. Osteopenia and degenerative changes. 2. No erosive or destructive bone lesion is seen suspicious for acute osteomyelitis. 3. Acute or least recent transverse nondisplaced fracture of the lateral malleolar tip with a small chip fracture off the posterior aspect of the lateral malleolar tip. 4. Recent nondisplaced transverse fracture extending AP through the medial aspect of the cuboid bone, and recent nondisplaced oblique fracture of the distal fifth metatarsal shaft. 5. Subcutaneous hematomas in the anterior upper foreleg and lateral ankle. 6. Diffuse edema in the ankle and foot. 7. 5.7 x 4.4 x 0.6 cm raised skin lesion in the anterior aspect of the distal foreleg, nonspecific. 8. Marked fatty atrophy of the mid to distal foreleg muscles and intrinsic foot muscles. Electronically Signed   By: Telford Nab M.D.   On: 08/05/2022 07:11   CT Head Wo Contrast  Result Date: 08/05/2022 CLINICAL DATA:  Fall. EXAM: CT HEAD WITHOUT CONTRAST TECHNIQUE: Contiguous axial images were obtained from the base of the skull through the vertex without intravenous contrast. RADIATION DOSE REDUCTION: This exam was performed according to the departmental dose-optimization program which includes automated exposure control, adjustment of the mA and/or kV according to patient size and/or use of iterative reconstruction technique. COMPARISON:  11/05/2020 FINDINGS: Brain: No acute intracranial abnormality. Specifically, no hemorrhage, hydrocephalus, mass lesion, acute infarction, or significant intracranial injury. Vascular: No hyperdense vessel or unexpected calcification. Skull: No acute calvarial abnormality. Sinuses/Orbits: No acute findings Other: None IMPRESSION: No acute intracranial abnormality. Electronically Signed   By: Rolm Baptise M.D.   On:  08/05/2022 01:54   DG Chest Portable 1 View  Result Date: 08/04/2022 CLINICAL DATA:  Fall. EXAM: PORTABLE CHEST 1 VIEW COMPARISON:  12/10/2020 FINDINGS: Stable heart size and mediastinal contours. Suspected right infrahilar scarring. No acute airspace disease. No significant pleural effusion. No visualized pneumothorax. Soft tissue attenuation from habitus limits detailed assessment. IMPRESSION: No acute findings. Suspected right infrahilar scarring. Electronically Signed   By: Keith Rake M.D.   On: 08/04/2022 23:44   DG Ankle Complete Right  Result Date: 08/04/2022 CLINICAL DATA:  Fall. EXAM: RIGHT ANKLE - COMPLETE 3+ VIEW COMPARISON:  None Available. FINDINGS: Transverse nondisplaced fracture through the distal aspect of the distal fibula. This is distal to the ankle mortise. No mortise widening. No additional fracture of the ankle. No definite ankle joint effusion. There is generalized soft tissue edema which is more prominent laterally. Soft tissue phleboliths. IMPRESSION: Nondisplaced transverse fracture through the distal aspect of the distal fibula. Adjacent soft tissue edema. Electronically Signed   By: Keith Rake M.D.   On: 08/04/2022 23:42   DG Tibia/Fibula Right  Result Date: 08/04/2022 CLINICAL DATA:  Fall. EXAM: RIGHT TIBIA AND FIBULA - 2 VIEW COMPARISON:  None Available. FINDINGS: There is no evidence of fracture or other focal bone lesions. The cortical margins of the tibia and fibula are intact. Multiple phleboliths in the soft tissues. Additional sheet like calcifications anteriorly over the mid lower leg. Soft tissue prominence and edema overlies the anterior upper aspect of the lower leg. IMPRESSION: 1. Soft tissue prominence and edema over the anterior upper aspect of the lower leg. No acute osseous abnormality. 2. Soft tissue phleboliths with additional sheet like calcifications anteriorly over the mid lower leg. Electronically Signed   By: Keith Rake M.D.   On:  08/04/2022 23:41   DG Knee 2 Views Right  Result Date: 08/04/2022 CLINICAL DATA:  Fall.  EXAM: RIGHT KNEE - 1-2 VIEW COMPARISON:  Radiograph 09/25/2020 FINDINGS: No fracture or dislocation. Mild medial tibiofemoral joint space narrowing. Tricompartmental peripheral spurring. No significant knee joint effusion. Soft tissue edema versus habitus. IMPRESSION: 1. No fracture or dislocation. 2. Mild tricompartmental osteoarthritis. Electronically Signed   By: Keith Rake M.D.   On: 08/04/2022 23:39   DG Foot Complete Right  Result Date: 08/04/2022 CLINICAL DATA:  Recent fall with right foot pain, initial encounter EXAM: RIGHT FOOT COMPLETE - 3+ VIEW COMPARISON:  None Available. FINDINGS: Curvilinear lucency is noted in the midportion of the fifth metatarsal consistent with undisplaced fracture. No other fracture is seen. No soft tissue abnormality is noted. Loss of the first distal phalangeal tuft is noted felt to be of a chronic nature. IMPRESSION: Undisplaced fifth metatarsal fracture Electronically Signed   By: Inez Catalina M.D.   On: 08/04/2022 23:38    Pending Labs Unresulted Labs (From admission, onward)     Start     Ordered   08/05/22 0046  Urinalysis, Routine w reflex microscopic -Urine, Clean Catch  Once,   URGENT       Question:  Specimen Source  Answer:  Urine, Clean Catch   08/05/22 0045   08/05/22 0045  Lactic acid, plasma  Now then every 2 hours,   R (with STAT occurrences)      08/05/22 0045   08/05/22 0045  Blood culture (routine x 2)  BLOOD CULTURE X 2,   R (with STAT occurrences)      08/05/22 0045   Signed and Held  HIV Antibody (routine testing w rflx)  (HIV Antibody (Routine testing w reflex) panel)  Add-on,   R        Signed and Held   Signed and Held  Hemoglobin A1c  Once,   R       Comments: To assess prior glycemic control    Signed and Held            Vitals/Pain Today's Vitals   08/05/22 0245 08/05/22 0335 08/05/22 0623 08/05/22 1204  BP: 125/61  (!)  119/99 (!) 151/79  Pulse: (!) 117  (!) 108 (!) 107  Resp: '12  16 18  '$ Temp:   98 F (36.7 C) 98.2 F (36.8 C)  TempSrc:   Oral   SpO2: 98%  95% 98%  Weight:      Height:      PainSc:  8       Isolation Precautions No active isolations  Medications Medications  0.9 %  sodium chloride infusion ( Intravenous New Bag/Given 08/05/22 0339)  acetaminophen (TYLENOL) tablet 650 mg (650 mg Oral Given 08/05/22 0630)    Or  acetaminophen (TYLENOL) suppository 650 mg ( Rectal See Alternative 08/05/22 0630)  ondansetron (ZOFRAN) tablet 4 mg (has no administration in time range)    Or  ondansetron (ZOFRAN) injection 4 mg (has no administration in time range)  hydrocerin (EUCERIN) cream (has no administration in time range)  influenza vac split quadrivalent PF (FLUARIX) injection 0.5 mL (has no administration in time range)  sodium chloride 0.9 % bolus 500 mL (0 mLs Intravenous Stopped 08/05/22 0127)  ceFAZolin (ANCEF) IVPB 1 g/50 mL premix (0 g Intravenous Stopped 08/05/22 0127)  lactated ringers bolus 1,000 mL (0 mLs Intravenous Stopped 08/05/22 0333)  nystatin (MYCOSTATIN/NYSTOP) topical powder ( Topical Given 08/05/22 0134)  oxyCODONE-acetaminophen (PERCOCET/ROXICET) 5-325 MG per tablet 1 tablet (1 tablet Oral Given 08/05/22 0133)  iohexol (OMNIPAQUE) 350 MG/ML injection 100  mL (100 mLs Intravenous Contrast Given 08/05/22 0615)    Mobility walks     Focused Assessments Cardiac Assessment Handoff:  Cardiac Rhythm: Sinus tachycardia No results found for: "CKTOTAL", "CKMB", "CKMBINDEX", "TROPONINI" No results found for: "DDIMER" Does the Patient currently have chest pain? No   , Neuro Assessment Handoff:  Swallow screen pass? Yes  Cardiac Rhythm: Sinus tachycardia       Neuro Assessment: Within Defined Limits Neuro Checks:      Has TPA been given? No If patient is a Neuro Trauma and patient is going to OR before floor call report to [facility] nurse: [phone] or  [phone]   R Recommendations: See Admitting Provider Note  Report given to:   Additional Notes:

## 2022-08-05 NOTE — Assessment & Plan Note (Signed)
Continue synthyroid 75 mcg daily.

## 2022-08-05 NOTE — Assessment & Plan Note (Addendum)
Chronic. On xarelto for CVA prophylaxis. On amiodarone.

## 2022-08-05 NOTE — Assessment & Plan Note (Signed)
Likely due to fall.

## 2022-08-05 NOTE — ED Notes (Signed)
Ortho tech notified about [facility] order

## 2022-08-05 NOTE — Consult Note (Addendum)
[facility] Nurse Consult Note: Reason for Consult: R leg cellulitis, wound  Wound type: partial thickness, traumatic  Pressure Injury POA: NA Measurement: Patient with multiple areas of partial thickness skin loss post fall  L great toe 1 cm x 1 cm L 2nd digit 1 cm x 1 cm  R medial ankle  2 cms x 1 cm   R lateral ankle 1 cm x 1 cm  R great toe 2 cms x 1 cm  R 2 digit 0.5 cms x 0. 5 cms  R lateral anterior leg (below knee) 3 cms x 2 cms  R anterior lower leg intact blister 6 cms x 5 cms  What appears to be a hematoma noted right lateral leg 8 cms x 8 cms  R lateral forearm 7 cms x 1 cm partial thickness skin loss  Wound bed: areas of partial thickness skin loss 100% pink moist  Drainage (amount, consistency, odor)  minimal serous  from open areas  Periwound: edema, erythema, some ecchymosis noted around area that appears to be a hematoma  Patient noted to have very dry scaly skin to bilateral lower legs and feet.  Dressing procedure/placement/frequency: Clean legs with soap and water and apply Eucerin to legs daily avoiding areas of partial thickness skin loss.  Do not get Eucerin in between toes.   Clean areas of partial thickness skin loss to R lower leg with NS, apply Xeroform gauze Kellie Simmering [phone])  to R anterior leg, cover with ABD pad, wrap with Kerlix roll gauze starting from above toes to right below knee.  Secure with 4" Ace wrap Kellie Simmering [phone]) in same fashion as kerlix.  For toes on right and left foot may cut a piece of Xeroform gauze to fit the open area and secure with foam or wrap gauze and tape.    R lateral forearm open area clean with NS, apply Xeroform gauze daily and cover with foam dressing.    WOC will not follow patient at this time.  Re-consult if further needs arise.   Thank you,    Dominie Benedick MSN, RN-BC, [employer]

## 2022-08-05 NOTE — Assessment & Plan Note (Signed)
Weight 235 kg, BMI 60

## 2022-08-05 NOTE — Progress Notes (Signed)
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 He states that he was going to the bathroom. He states that he fell onto his right lower leg. This hit the floor. He states that top of his right knee hit the baseboard electric heater. He required assistance to get up as he weighs close to 500 pounds.   He still lives at home with his parents.   Assessment & Plan:   Principal Problem:   Cellulitis of right leg Active Problems:   Atrial fibrillation, chronic (HCC)   Essential hypertension   Dyslipidemia   Acquired hypothyroidism   Chronic pain disorder   Morbid obesity with BMI of 60.0-69.9, adult ([place])   Super-super obese (HCC)   DM2 (diabetes mellitus, type 2) (HCC)   Adrenal insufficiency ([place]) - secondary   Closed fracture of distal fibula - right   Cellulitis of right leg IV Abx with Ancef. Concerned there is a hematoma on the lateral aspect of left upper leg. Will check CT right leg//foot to evaluate for hematoma and abscess.   Closed fracture of distal fibula/5th metatarsal - right Likely due to fall. Continue boot with weight bearing - no surgical intervention indicated   Adrenal insufficiency (HCC) - secondary Continue with prednisone 5 mg daily.  If he becomes hypotensive, give IV solumedrol 60 mg or IV solucortef 100 mg.   DM2 (diabetes mellitus, type 2) ([place]) Continue with meal time insulin. Pt states he takes tresiba 100 units daily. Will change over to lantus and split the dose.   Super obese ([place]) Morbid obesity with BMI of 60.0-69.9, adult  (HCC) Weight 235 kg, BMI 60 Lengthy discussion on weight loss   Chronic pain disorder Continue lyrica 200 mg tid   Acquired hypothyroidism Continue synthyroid 75 mcg daily.   Dyslipidemia Continue lipitor 20 mg and zetia 10 mg daily.   Essential hypertension No medication listed on home med rec.   Atrial fibrillation, chronic (HCC) Chronic. On xarelto for CVA prophylaxis. On amiodarone.  DVT prophylaxis: xarelto Code Status: Full Family Communication: None  Status is: Inpt  Dispo: The patient is from: Home              Anticipated d/c is to: Home              Anticipated d/c date is: 48-72h              Patient currently NOT medically stable for discharge  Consultants:  None  Procedures:  None  Antimicrobials:  ancef   Subjective: No acute issues/events overnight  Objective: Vitals:   08/05/22 0137 08/05/22 0200 08/05/22 0245 08/05/22 0623  BP: 128/76 116/71 125/61 (!) 119/99  Pulse: (!) 127 (!) 117 (!) 117 (!) 108  Resp: '19 10 12 16  '$ Temp:    98 F (36.7 C)  TempSrc:    Oral  SpO2: 96% 93% 98% 95%  Weight:      Height:       No intake or output data in the 24 hours ending 08/05/22 0821 Filed Weights   08/04/22 2144 08/04/22 2251  Weight: (!) 235.9 kg (!) 235.9 kg    Examination:  General: Super obese gentleman pleasantly resting in bed, No acute distress. HEENT:  Normocephalic atraumatic.  Sclerae nonicteric, noninjected.  Extraocular movements intact bilaterally. Neck:  Without mass or deformity.  Trachea is midline. Lungs:  Clear to auscultate bilaterally without rhonchi, wheeze, or rales. Heart:  Regular rate and rhythm.  Without murmurs, rubs, or gallops. Abdomen:  Soft, nontender, nondistended.  Without guarding or rebound. Skin: see attached images for BLE wounds: RLE:   LLE: anterior knee     Data Reviewed: I have personally reviewed following labs and imaging studies  CBC: Recent Labs  Lab 08/04/22 2300  WBC 15.6*  HGB 13.9   HCT 43.8  MCV 84.7  PLT [id number]   Basic Metabolic Panel: Recent Labs  Lab 08/04/22 2300 08/05/22 0155  NA 138  --   K 4.1  --   CL 102  --   CO2 20*  --   GLUCOSE 214*  --   BUN 15  --   CREATININE 1.44*  --   CALCIUM 8.5*  --   MG  --  1.6*   GFR: Estimated Creatinine Clearance: 129.5 mL/min (A) (by C-G formula based on SCr of 1.44 mg/dL (H)). Liver Function Tests: Recent Labs  Lab 08/05/22 0155  AST 29  ALT 17  ALKPHOS 76  BILITOT 0.4  PROT 7.0  ALBUMIN 2.8*   Sepsis Labs: Recent Labs  Lab 08/05/22 0155  LATICACIDVEN 3.4*    No results found for this or any previous visit (from the past 240 hour(s)).   Radiology Studies: CT FOOT RIGHT W CONTRAST  Result Date: 08/05/2022 CLINICAL DATA:  Right foot swelling. Diabetic with osteomyelitis suspected. EXAM: CT OF THE LOWER RIGHT EXTREMITY WITH CONTRAST TECHNIQUE: Multidetector CT imaging of the lower right extremity was performed from the distal femoral diametaphysis through the foot according to the standard protocol following intravenous contrast administration. RADIATION DOSE REDUCTION: This exam was performed according to the departmental dose-optimization program which includes automated exposure control, adjustment of the mA and/or kV according to patient size and/or use of iterative reconstruction technique. CONTRAST:  155m OMNIPAQUE IOHEXOL 350 MG/ML SOLN COMPARISON:  Right ankle and foot films from yesterday. FINDINGS: Bones/Joint/Cartilage There is diffuse osteopenia. Acute or least recent transverse nondisplaced fracture of the lateral malleolar tip is noted, centered anteriorly with a small chip fracture off the posterior aspect of the lateral malleolar tip, the latter distracted a few mm from the parent bone. In the foot, there is a recent nondisplaced transverse fracture extending AP through the medial aspect of the cuboid bone, and a recent nondisplaced oblique fracture of the distal fifth metatarsal shaft which  was also noted on the plain radiographs. There are no apparent joint effusions at the knee and ankle and no further evidence of fractures. No erosive or destructive bone lesion is seen suspicious for acute osteomyelitis. There is tricompartmental degenerative arthrosis of the knee, with joint space loss greatest along the medial femorotibial joint and the lateral patellofemoral joint, with small tricompartmental marginal spurs. At the ankle, there is narrowing along the medial tibiotalar joint most likely due to cartilage loss, and mild midfoot arthrosis as well as hammertoe deformities of the second through fifth toes and mild nonerosive degenerative arthrosis of the forefoot. Ligaments Suboptimally assessed by CT. Muscles and Tendons There is moderate partial fatty atrophy in the distal thigh musculature and superior foreleg musculature and advanced marked fatty atrophy of the mid  to distal foreleg muscles and intrinsic foot muscles. Area tendons are not well seen with this technique but grossly intact, as visualized. Soft tissues Subcutaneously in the anterior upper foreleg there is high density fluid measuring 77 Hounsfield units collecting subcutaneously most likely representing a hematoma measuring 13.4 cm transverse, 1.7 cm AP, and 8.9 cm craniocaudal. There is diffuse edema in the ankle and foot, at the ankle is greatest laterally and over the foot is greatest superiorly. There is additional dense fluid collecting subcutaneously at the lateral ankle and also is most likely due to subcutaneous hematoma, estimated to measure 7.9 cm AP, 13.1 cm coronal and 6.6 cm craniocaudal. There are scattered phleboliths in the anteromedial soft tissues of the foreleg and laterally at the level of the ankle. Also noted is a raised skin lesion of indeterminate etiology in the anterior aspect of the distal foreleg, measuring 5.7 cm craniocaudal, 4.4 cm transversely and 0.6 cm height, and could be a blister but is nonspecific.  There is no evidence of an abscess, soft tissue gas, or soft tissue mass. IMPRESSION: 1. Osteopenia and degenerative changes. 2. No erosive or destructive bone lesion is seen suspicious for acute osteomyelitis. 3. Acute or least recent transverse nondisplaced fracture of the lateral malleolar tip with a small chip fracture off the posterior aspect of the lateral malleolar tip. 4. Recent nondisplaced transverse fracture extending AP through the medial aspect of the cuboid bone, and recent nondisplaced oblique fracture of the distal fifth metatarsal shaft. 5. Subcutaneous hematomas in the anterior upper foreleg and lateral ankle. 6. Diffuse edema in the ankle and foot. 7. 5.7 x 4.4 x 0.6 cm raised skin lesion in the anterior aspect of the distal foreleg, nonspecific. 8. Marked fatty atrophy of the mid to distal foreleg muscles and intrinsic foot muscles. Electronically Signed   By: Telford Nab M.D.   On: 08/05/2022 07:11   CT TIBIA FIBULA RIGHT W CONTRAST  Result Date: 08/05/2022 CLINICAL DATA:  Right foot swelling. Diabetic with osteomyelitis suspected. EXAM: CT OF THE LOWER RIGHT EXTREMITY WITH CONTRAST TECHNIQUE: Multidetector CT imaging of the lower right extremity was performed from the distal femoral diametaphysis through the foot according to the standard protocol following intravenous contrast administration. RADIATION DOSE REDUCTION: This exam was performed according to the departmental dose-optimization program which includes automated exposure control, adjustment of the mA and/or kV according to patient size and/or use of iterative reconstruction technique. CONTRAST:  145m OMNIPAQUE IOHEXOL 350 MG/ML SOLN COMPARISON:  Right ankle and foot films from yesterday. FINDINGS: Bones/Joint/Cartilage There is diffuse osteopenia. Acute or least recent transverse nondisplaced fracture of the lateral malleolar tip is noted, centered anteriorly with a small chip fracture off the posterior aspect of the lateral  malleolar tip, the latter distracted a few mm from the parent bone. In the foot, there is a recent nondisplaced transverse fracture extending AP through the medial aspect of the cuboid bone, and a recent nondisplaced oblique fracture of the distal fifth metatarsal shaft which was also noted on the plain radiographs. There are no apparent joint effusions at the knee and ankle and no further evidence of fractures. No erosive or destructive bone lesion is seen suspicious for acute osteomyelitis. There is tricompartmental degenerative arthrosis of the knee, with joint space loss greatest along the medial femorotibial joint and the lateral patellofemoral joint, with small tricompartmental marginal spurs. At the ankle, there is narrowing along the medial tibiotalar joint most likely due to cartilage loss, and mild midfoot arthrosis as well  as hammertoe deformities of the second through fifth toes and mild nonerosive degenerative arthrosis of the forefoot. Ligaments Suboptimally assessed by CT. Muscles and Tendons There is moderate partial fatty atrophy in the distal thigh musculature and superior foreleg musculature and advanced marked fatty atrophy of the mid to distal foreleg muscles and intrinsic foot muscles. Area tendons are not well seen with this technique but grossly intact, as visualized. Soft tissues Subcutaneously in the anterior upper foreleg there is high density fluid measuring 77 Hounsfield units collecting subcutaneously most likely representing a hematoma measuring 13.4 cm transverse, 1.7 cm AP, and 8.9 cm craniocaudal. There is diffuse edema in the ankle and foot, at the ankle is greatest laterally and over the foot is greatest superiorly. There is additional dense fluid collecting subcutaneously at the lateral ankle and also is most likely due to subcutaneous hematoma, estimated to measure 7.9 cm AP, 13.1 cm coronal and 6.6 cm craniocaudal. There are scattered phleboliths in the anteromedial soft  tissues of the foreleg and laterally at the level of the ankle. Also noted is a raised skin lesion of indeterminate etiology in the anterior aspect of the distal foreleg, measuring 5.7 cm craniocaudal, 4.4 cm transversely and 0.6 cm height, and could be a blister but is nonspecific. There is no evidence of an abscess, soft tissue gas, or soft tissue mass. IMPRESSION: 1. Osteopenia and degenerative changes. 2. No erosive or destructive bone lesion is seen suspicious for acute osteomyelitis. 3. Acute or least recent transverse nondisplaced fracture of the lateral malleolar tip with a small chip fracture off the posterior aspect of the lateral malleolar tip. 4. Recent nondisplaced transverse fracture extending AP through the medial aspect of the cuboid bone, and recent nondisplaced oblique fracture of the distal fifth metatarsal shaft. 5. Subcutaneous hematomas in the anterior upper foreleg and lateral ankle. 6. Diffuse edema in the ankle and foot. 7. 5.7 x 4.4 x 0.6 cm raised skin lesion in the anterior aspect of the distal foreleg, nonspecific. 8. Marked fatty atrophy of the mid to distal foreleg muscles and intrinsic foot muscles. Electronically Signed   By: Telford Nab M.D.   On: 08/05/2022 07:11   CT Head Wo Contrast  Result Date: 08/05/2022 CLINICAL DATA:  Fall. EXAM: CT HEAD WITHOUT CONTRAST TECHNIQUE: Contiguous axial images were obtained from the base of the skull through the vertex without intravenous contrast. RADIATION DOSE REDUCTION: This exam was performed according to the departmental dose-optimization program which includes automated exposure control, adjustment of the mA and/or kV according to patient size and/or use of iterative reconstruction technique. COMPARISON:  11/05/2020 FINDINGS: Brain: No acute intracranial abnormality. Specifically, no hemorrhage, hydrocephalus, mass lesion, acute infarction, or significant intracranial injury. Vascular: No hyperdense vessel or unexpected  calcification. Skull: No acute calvarial abnormality. Sinuses/Orbits: No acute findings Other: None IMPRESSION: No acute intracranial abnormality. Electronically Signed   By: Rolm Baptise M.D.   On: 08/05/2022 01:54   DG Chest Portable 1 View  Result Date: 08/04/2022 CLINICAL DATA:  Fall. EXAM: PORTABLE CHEST 1 VIEW COMPARISON:  12/10/2020 FINDINGS: Stable heart size and mediastinal contours. Suspected right infrahilar scarring. No acute airspace disease. No significant pleural effusion. No visualized pneumothorax. Soft tissue attenuation from habitus limits detailed assessment. IMPRESSION: No acute findings. Suspected right infrahilar scarring. Electronically Signed   By: Keith Rake M.D.   On: 08/04/2022 23:44   DG Ankle Complete Right  Result Date: 08/04/2022 CLINICAL DATA:  Fall. EXAM: RIGHT ANKLE - COMPLETE 3+ VIEW COMPARISON:  None  Available. FINDINGS: Transverse nondisplaced fracture through the distal aspect of the distal fibula. This is distal to the ankle mortise. No mortise widening. No additional fracture of the ankle. No definite ankle joint effusion. There is generalized soft tissue edema which is more prominent laterally. Soft tissue phleboliths. IMPRESSION: Nondisplaced transverse fracture through the distal aspect of the distal fibula. Adjacent soft tissue edema. Electronically Signed   By: Keith Rake M.D.   On: 08/04/2022 23:42   DG Tibia/Fibula Right  Result Date: 08/04/2022 CLINICAL DATA:  Fall. EXAM: RIGHT TIBIA AND FIBULA - 2 VIEW COMPARISON:  None Available. FINDINGS: There is no evidence of fracture or other focal bone lesions. The cortical margins of the tibia and fibula are intact. Multiple phleboliths in the soft tissues. Additional sheet like calcifications anteriorly over the mid lower leg. Soft tissue prominence and edema overlies the anterior upper aspect of the lower leg. IMPRESSION: 1. Soft tissue prominence and edema over the anterior upper aspect of the lower  leg. No acute osseous abnormality. 2. Soft tissue phleboliths with additional sheet like calcifications anteriorly over the mid lower leg. Electronically Signed   By: Keith Rake M.D.   On: 08/04/2022 23:41   DG Knee 2 Views Right  Result Date: 08/04/2022 CLINICAL DATA:  Fall. EXAM: RIGHT KNEE - 1-2 VIEW COMPARISON:  Radiograph 09/25/2020 FINDINGS: No fracture or dislocation. Mild medial tibiofemoral joint space narrowing. Tricompartmental peripheral spurring. No significant knee joint effusion. Soft tissue edema versus habitus. IMPRESSION: 1. No fracture or dislocation. 2. Mild tricompartmental osteoarthritis. Electronically Signed   By: Keith Rake M.D.   On: 08/04/2022 23:39   DG Foot Complete Right  Result Date: 08/04/2022 CLINICAL DATA:  Recent fall with right foot pain, initial encounter EXAM: RIGHT FOOT COMPLETE - 3+ VIEW COMPARISON:  None Available. FINDINGS: Curvilinear lucency is noted in the midportion of the fifth metatarsal consistent with undisplaced fracture. No other fracture is seen. No soft tissue abnormality is noted. Loss of the first distal phalangeal tuft is noted felt to be of a chronic nature. IMPRESSION: Undisplaced fifth metatarsal fracture Electronically Signed   By: Inez Catalina M.D.   On: 08/04/2022 23:38    Scheduled Meds: Continuous Infusions:  sodium chloride 150 mL/hr at 08/05/22 0339     LOS: 0 days   Time spent: 69mn  Nesta Kimple C Odaly Peri, DO [facility]  If 7PM-7AM, please contact night-coverage [email or web address]  08/05/2022, 8:21 AM

## 2022-08-05 NOTE — Plan of Care (Signed)

## 2022-08-05 NOTE — Subjective & Objective (Signed)
CC: right leg swelling HPI: 52 year old white male with a history of type 2 diabetes on insulin, super morbid obesity with a BMI of 60, weight of 235 kg, history of Fournier's gangrene, chronic A-fib on Xarelto, hypothyroidism, chronic pain disorder, 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  He states that he was going to the bathroom.  He states that he fell onto his right lower leg.  This hit the floor.  He states that top of his right knee hit the baseboard electric heater.  He required assistance to get up as he weighs close to 500 pounds.  He still lives at home with his parents.  On arrival temp 90.1 heart rate 124 blood pressure 139/70 satting 94% on room air.  Labs showed a white count of 15.6, hemoglobin 13.9, platelet 228  Sodium 138, potassium 4.1, BUN of 15, creatinine 1.4 glucose of 214  BNP normal at 11  Mag little low at 1.6  Lactic acid elevated 3.4  CT head showed no acute findings.  Right ankle x-ray shows a nondisplaced transverse fracture of the distal aspect of the distal fibula.  [facility] contacted for admission.

## 2022-08-05 NOTE — H&P (Signed)
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  He states that he was going to the bathroom.  He states that he fell onto his right lower leg.  This hit the floor.  He states that top of his right knee hit the baseboard electric heater.  He required assistance to get up as he weighs close to 500 pounds.  He still lives at home with his parents.  On arrival temp 90.1 heart rate 124 blood pressure 139/70 satting 94% on room air.  Labs showed a white count of 15.6, hemoglobin 13.9, platelet 228  Sodium 138, potassium 4.1, BUN of 15, creatinine 1.4 glucose of 214  BNP normal at 11  Mag little low at 1.6  Lactic acid elevated 3.4  CT head showed no acute findings.  Right ankle x-ray shows a nondisplaced transverse fracture of the distal aspect of the distal fibula.  [facility] hospitalist contacted for admission.   ED Course: WBC 15, ankle xray show distal fibula fracture  Review of Systems:  Review of Systems  Constitutional: Negative.   HENT: Negative.    Eyes: Negative.   Respiratory: Negative.    Cardiovascular: Negative.   Gastrointestinal: Negative.   Genitourinary: Negative.   Musculoskeletal:  Positive for falls.  Skin:        Pain, swelling right lower leg  Neurological: Negative.   Endo/Heme/Allergies: Negative.   Psychiatric/Behavioral: Negative.    All other systems  reviewed and are negative.   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   Asthma    Atrial fibrillation ([place])    Diabetes mellitus without complication ([place])    Fournier gangrene 06/10/2020   History of cardioversion    3 times    Hypertension    Sepsis due to Streptococcus, group B ([place]) 06/22/2020    Past Surgical History:  Procedure Laterality Date   ABDOMINAL SURGERY     INCISION AND DRAINAGE PERIRECTAL ABSCESS N/A 06/11/2020   Procedure: IRRIGATION AND DEBRIDEMENT PERINEAL WOUND;  Surgeon: Dwan Bolt, MD;  Location: [place];  Service: General;  Laterality: N/A;   [place] N/A 06/10/2020   Procedure: IRRIGATION AND DEBRIDEMENT ABSCESS;  Surgeon: Dwan Bolt, MD;  Location: [place];  Service: General;  Laterality: N/A;   LAPAROSCOPIC GASTRIC SLEEVE RESECTION       reports that he has been smoking. He has never used smokeless tobacco. He reports that he does not currently use alcohol. He reports that he does not use drugs.  Allergies  Allergen Reactions   Bee Venom Anaphylaxis    Other reaction(s): Unknown Other reaction(s): Unknown Other reaction(s): Unknown    Sglt2 Inhibitors Other (See Comments)    Necrotizing infection of the perineum   Other Hives    Family History  Problem Relation Age of Onset   Diabetes Mother    Healthy Brother  Breast cancer Paternal Grandmother    Pancreatic cancer Paternal Uncle     Prior to Admission medications   Medication Sig Start Date End Date Taking? Authorizing Provider  acetaminophen (TYLENOL) 325 MG tablet Take 1-2 tablets (325-650 mg total) by mouth every 4 (four) hours as needed for mild pain. 09/06/20   Love, Ivan Anchors, PA-C  albuterol (PROVENTIL) (2.5 MG/3ML) 0.083% nebulizer solution Take 3 mLs (2.5 mg total) by nebulization every 4 (four) hours as needed for wheezing or shortness of breath. 08/01/20   Samella Parr, NP  amiodarone (PACERONE) 200 MG tablet Take 1  tablet (200 mg total) by mouth daily. 09/12/20   Love, Ivan Anchors, PA-C  atorvastatin (LIPITOR) 20 MG tablet Take 1 tablet (20 mg total) by mouth every evening. 09/12/20   Love, Ivan Anchors, PA-C  bisacodyl (DULCOLAX) 10 MG suppository Place 1 suppository (10 mg total) rectally daily as needed for moderate constipation. 09/06/20   Love, Ivan Anchors, PA-C  Cholecalciferol (VITAMIN D3) 125 MCG (5000 UT) TABS Take 5,000 Units by mouth daily.    [provider]  docusate sodium (COLACE) 100 MG capsule Take 1 capsule (100 mg total) by mouth daily. 09/12/20   Love, Ivan Anchors, PA-C  ezetimibe (ZETIA) 10 MG tablet Take 10 mg by mouth daily.    [provider]  hydrocortisone (ANUSOL-HC) 2.5 % rectal cream Place rectally 4 (four) times daily. Patient taking differently: Place 1 application rectally 4 (four) times daily as needed for hemorrhoids or anal itching. 09/12/20   Love, Ivan Anchors, PA-C  hydrocortisone (CORTEF) 5 MG tablet Take 15 mg in am (8; am) then take 5 mg 12;noon then take 5 mg at 16:00.  Daily. 12/16/20   Regalado, Belkys A, MD  insulin aspart (NOVOLOG) 100 UNIT/ML FlexPen Inject 10 Units into the skin 3 (three) times daily with meals. 09/12/20   Love, Ivan Anchors, PA-C  insulin detemir (LEVEMIR) 100 UNIT/ML FlexPen Inject 40 Units into the skin 2 (two) times daily. 09/12/20   Love, Ivan Anchors, PA-C  Insulin Pen Needle (COMFORT EZ PEN NEEDLES) 32G X 6 MM MISC 1 application by Does not apply route with breakfast, with lunch, and with evening meal. 09/12/20   Love, Ivan Anchors, PA-C  levothyroxine (SYNTHROID) 75 MCG tablet Take 75 mcg by mouth daily. 04/03/20   [provider]  melatonin 3 MG TABS tablet Take 1 tablet (3 mg total) by mouth at bedtime as needed. 08/01/20   Samella Parr, NP  methocarbamol (ROBAXIN) 750 MG tablet Take 1 tablet (750 mg total) by mouth every 6 (six) hours as needed for muscle spasms. 09/12/20   Love, Ivan Anchors, PA-C  Multiple Vitamins-Minerals (ALIVE MENS ENERGY PO) Take  1 tablet by mouth daily.    [provider]  nortriptyline (PAMELOR) 50 MG capsule Take 1 capsule (50 mg total) by mouth 2 (two) times daily. 09/12/20   Love, Ivan Anchors, PA-C  omeprazole (PRILOSEC) 20 MG capsule Take 20 mg by mouth 2 (two) times daily. 10/07/20   [provider]  pantoprazole (PROTONIX) 40 MG tablet Take 1 tablet (40 mg total) by mouth daily. 09/12/20   Love, Ivan Anchors, PA-C  polyethylene glycol (MIRALAX / GLYCOLAX) 17 g packet Take 17 g by mouth daily. 09/12/20   Love, Ivan Anchors, PA-C  pregabalin (LYRICA) 300 MG capsule Take 1 capsule (300 mg total) by mouth 2 (two) times daily. 09/12/20   Love, Ivan Anchors, PA-C  rivaroxaban (XARELTO) 20 MG TABS tablet  Take 1 tablet (20 mg total) by mouth daily with supper. 09/12/20   Love, Ivan Anchors, PA-C  saccharomyces boulardii (FLORASTOR) 250 MG capsule Take 1 capsule (250 mg total) by mouth 2 (two) times daily. 08/05/20   Johnson, Clanford L, MD  senna-docusate (SENOKOT-S) 8.6-50 MG tablet Take 1 tablet by mouth 2 (two) times daily. 09/12/20   Love, Ivan Anchors, PA-C  tamsulosin (FLOMAX) 0.4 MG CAPS capsule Take 0.4 mg by mouth daily. 10/24/20   [provider]  VITAMIN A PO Take 1 capsule by mouth daily.    [provider]  white petrolatum (VASELINE) OINT Apply 1 application topically daily. To both feet at nights and cover with socks 09/06/20   Love, Ivan Anchors, PA-C  witch hazel-glycerin (TUCKS) pad Apply topically as needed for itching. 09/06/20   Love, Ivan Anchors, PA-C  zinc gluconate 50 MG tablet Take 50 mg by mouth daily.    [provider]    Physical Exam: Vitals:   08/04/22 2251 08/05/22 0137 08/05/22 0200 08/05/22 0245  BP:  128/76 116/71 125/61  Pulse:  (!) 127 (!) 117 (!) 117  Resp:  '19 10 12  '$ Temp: 98.1 F (36.7 C)     TempSrc: Oral     SpO2:  96% 93% 98%  Weight: (!) 235.9 kg     Height: '6\' 6"'$  (1.981 m)       Physical Exam Vitals and nursing note reviewed.  Constitutional:      General: He is  not in acute distress.    Appearance: He is obese. He is not toxic-appearing or diaphoretic.  HENT:     Head: Normocephalic and atraumatic.     Nose: Nose normal.  Eyes:     General: No scleral icterus. Cardiovascular:     Rate and Rhythm: Regular rhythm. Tachycardia present.  Pulmonary:     Effort: Pulmonary effort is normal.  Abdominal:     General: There is no distension.     Tenderness: There is no abdominal tenderness.  Skin:    Capillary Refill: Capillary refill takes less than 2 seconds.     Comments: See picture of right lower leg Area of fluctuance right lateral leg  Neurological:     Mental Status: He is alert and oriented to person, place, and time.      Labs on Admission: I have personally reviewed following labs and imaging studies  CBC: Recent Labs  Lab 08/04/22 2300  WBC 15.6*  HGB 13.9  HCT 43.8  MCV 84.7  PLT [id number]   Basic Metabolic Panel: Recent Labs  Lab 08/04/22 2300 08/05/22 0155  NA 138  --   K 4.1  --   CL 102  --   CO2 20*  --   GLUCOSE 214*  --   BUN 15  --   CREATININE 1.44*  --   CALCIUM 8.5*  --   MG  --  1.6*   GFR: Estimated Creatinine Clearance: 129.5 mL/min (A) (by C-G formula based on SCr of 1.44 mg/dL (H)). Liver Function Tests: Recent Labs  Lab 08/05/22 0155  AST 29  ALT 17  ALKPHOS 76  BILITOT 0.4  PROT 7.0  ALBUMIN 2.8*    Cardiac Enzymes: Recent Labs  Lab 08/05/22 0155  TROPONINIHS 11   BNP (last 3 results) Recent Labs    08/05/22 0155  BNP 11.1   Radiological Exams on Admission: I have personally reviewed images CT Head Wo Contrast  Result Date: 08/05/2022 CLINICAL DATA:  Fall. EXAM: CT HEAD WITHOUT CONTRAST TECHNIQUE: Contiguous axial images were obtained from the base of the skull through the vertex without intravenous contrast. RADIATION DOSE REDUCTION: This exam was performed according to the departmental dose-optimization program which includes automated exposure control, adjustment of the mA  and/or kV according to patient size and/or use of iterative reconstruction technique. COMPARISON:  11/05/2020 FINDINGS: Brain: No acute intracranial abnormality. Specifically, no hemorrhage, hydrocephalus, mass lesion, acute infarction, or significant intracranial injury. Vascular: No hyperdense vessel or unexpected calcification. Skull: No acute calvarial abnormality. Sinuses/Orbits: No acute findings Other: None IMPRESSION: No acute intracranial abnormality. Electronically Signed   By: Rolm Baptise M.D.   On: 08/05/2022 01:54   DG Chest Portable 1 View  Result Date: 08/04/2022 CLINICAL DATA:  Fall. EXAM: PORTABLE CHEST 1 VIEW COMPARISON:  12/10/2020 FINDINGS: Stable heart size and mediastinal contours. Suspected right infrahilar scarring. No acute airspace disease. No significant pleural effusion. No visualized pneumothorax. Soft tissue attenuation from habitus limits detailed assessment. IMPRESSION: No acute findings. Suspected right infrahilar scarring. Electronically Signed   By: Keith Rake M.D.   On: 08/04/2022 23:44   DG Ankle Complete Right  Result Date: 08/04/2022 CLINICAL DATA:  Fall. EXAM: RIGHT ANKLE - COMPLETE 3+ VIEW COMPARISON:  None Available. FINDINGS: Transverse nondisplaced fracture through the distal aspect of the distal fibula. This is distal to the ankle mortise. No mortise widening. No additional fracture of the ankle. No definite ankle joint effusion. There is generalized soft tissue edema which is more prominent laterally. Soft tissue phleboliths. IMPRESSION: Nondisplaced transverse fracture through the distal aspect of the distal fibula. Adjacent soft tissue edema. Electronically Signed   By: Keith Rake M.D.   On: 08/04/2022 23:42   DG Tibia/Fibula Right  Result Date: 08/04/2022 CLINICAL DATA:  Fall. EXAM: RIGHT TIBIA AND FIBULA - 2 VIEW COMPARISON:  None Available. FINDINGS: There is no evidence of fracture or other focal bone lesions. The cortical margins of the tibia  and fibula are intact. Multiple phleboliths in the soft tissues. Additional sheet like calcifications anteriorly over the mid lower leg. Soft tissue prominence and edema overlies the anterior upper aspect of the lower leg. IMPRESSION: 1. Soft tissue prominence and edema over the anterior upper aspect of the lower leg. No acute osseous abnormality. 2. Soft tissue phleboliths with additional sheet like calcifications anteriorly over the mid lower leg. Electronically Signed   By: Keith Rake M.D.   On: 08/04/2022 23:41   DG Knee 2 Views Right  Result Date: 08/04/2022 CLINICAL DATA:  Fall. EXAM: RIGHT KNEE - 1-2 VIEW COMPARISON:  Radiograph 09/25/2020 FINDINGS: No fracture or dislocation. Mild medial tibiofemoral joint space narrowing. Tricompartmental peripheral spurring. No significant knee joint effusion. Soft tissue edema versus habitus. IMPRESSION: 1. No fracture or dislocation. 2. Mild tricompartmental osteoarthritis. Electronically Signed   By: Keith Rake M.D.   On: 08/04/2022 23:39   DG Foot Complete Right  Result Date: 08/04/2022 CLINICAL DATA:  Recent fall with right foot pain, initial encounter EXAM: RIGHT FOOT COMPLETE - 3+ VIEW COMPARISON:  None Available. FINDINGS: Curvilinear lucency is noted in the midportion of the fifth metatarsal consistent with undisplaced fracture. No other fracture is seen. No soft tissue abnormality is noted. Loss of the first distal phalangeal tuft is noted felt to be of a chronic nature. IMPRESSION: Undisplaced fifth metatarsal fracture Electronically Signed   By: Inez Catalina M.D.   On: 08/04/2022 23:38    EKG: My personal interpretation of EKG shows: sinus tachycardia  Assessment/Plan Principal Problem:   Cellulitis of right leg Active Problems:   Atrial fibrillation, chronic (HCC)   Essential hypertension   Dyslipidemia   Acquired hypothyroidism   Chronic pain disorder   Morbid obesity with BMI of 60.0-69.9, adult ([place])   Super-super obese  (HCC)   DM2 (diabetes mellitus, type 2) ([place])   Adrenal insufficiency ([place]) - secondary   Closed fracture of distal fibula - right    Assessment and Plan: * Cellulitis of right leg Admit to med/tele bed. IV Abx with Ancef. Concerned there is a hematoma on the lateral aspect of left upper leg. Will check CT right leg//foot to evaluate for hematoma and abscess.  Closed fracture of distal fibula - right Likely due to fall.   Adrenal insufficiency (HCC) - secondary Continue with prednisone 5 mg daily. If he becomes hypotensive, give IV solumedrol 60 mg or IV solucortef 100 mg.  DM2 (diabetes mellitus, type 2) ([place]) Continue with meal time insulin. Pt states he takes tresiba 100 units daily. Will change over to lantus and split the dose.  Super-super obese (HCC) Weight 235 kg, BMI 60  Morbid obesity with BMI of 60.0-69.9, adult (HCC) Weight 235 kg, BMI 60  Chronic pain disorder Continue lyrica 200 mg tid  Acquired hypothyroidism Continue synthyroid 75 mcg daily.  Dyslipidemia Continue lipitor 20 mg and zetia 10 mg daily.  Essential hypertension Cannot find what HTN meds he is on.  Atrial fibrillation, chronic (HCC) Chronic. On xarelto for CVA prophylaxis. On amiodarone.   DVT prophylaxis: Xarelto Code Status: Full Code Family Communication: no family at bedside  Disposition Plan: return home  Consults called: none  Admission status: Inpatient, Telemetry bed   Kristopher Oppenheim, DO [facility] 08/05/2022, 3:33 AM

## 2022-08-05 NOTE — Assessment & Plan Note (Signed)
Cannot find what HTN meds he is on.

## 2022-08-06 LAB — BASIC METABOLIC PANEL
Anion gap: 10 (ref 5–15)
BUN: 7 mg/dL (ref 6–20)
CO2: 26 mmol/L (ref 22–32)
Calcium: 8.1 mg/dL — ABNORMAL LOW (ref 8.9–10.3)
Chloride: 100 mmol/L (ref 98–111)
Creatinine, Ser: 0.93 mg/dL (ref 0.61–1.24)
GFR, Estimated: >60 mL/min (ref >60)
Glucose, Bld: 195 mg/dL — ABNORMAL HIGH (ref 70–99)
Potassium: 3.2 mmol/L — ABNORMAL LOW (ref 3.5–5.1)
Sodium: 136 mmol/L (ref 135–145)

## 2022-08-06 LAB — CBC
HCT: 36.2 % — ABNORMAL LOW (ref 39.0–52.0)
Hemoglobin: 11.1 g/dL — ABNORMAL LOW (ref 13.0–17.0)
MCH: 25.8 pg — ABNORMAL LOW (ref 26.0–34.0)
MCHC: 30.7 g/dL (ref 30.0–36.0)
MCV: 84.2 fL (ref 80.0–100.0)
Platelets: 173 10*3/uL (ref 150–400)
RBC: 4.3 MIL/uL (ref 4.22–5.81)
RDW: 16.8 % — ABNORMAL HIGH (ref 11.5–15.5)
WBC: 7.1 10*3/uL (ref 4.0–10.5)
nRBC: 0 % (ref 0.0–0.2)

## 2022-08-06 LAB — GLUCOSE, CAPILLARY
Glucose-Capillary: 232 mg/dL — ABNORMAL HIGH (ref 70–99)
Glucose-Capillary: 234 mg/dL — ABNORMAL HIGH (ref 70–99)
Glucose-Capillary: 206 mg/dL — ABNORMAL HIGH (ref 70–99)
Glucose-Capillary: 250 mg/dL — ABNORMAL HIGH (ref 70–99)

## 2022-08-06 MED ORDER — POTASSIUM CHLORIDE CRYS ER 20 MEQ PO TBCR
40.0000 meq | EXTENDED_RELEASE_TABLET | Freq: Two times a day (BID) | ORAL | Status: AC
  Administered 2022-08-06 (×2): 40 meq via ORAL
  Filled 2022-08-06 (×2): qty 2

## 2022-08-06 MED ORDER — MAGNESIUM SULFATE 4 GM/100ML IV SOLN
4.0000 g | Freq: Once | INTRAVENOUS | Status: AC
  Administered 2022-08-06: 4 g via INTRAVENOUS
  Filled 2022-08-06: qty 100

## 2022-08-06 MED ORDER — LEVOTHYROXINE SODIUM 100 MCG PO TABS
100.0000 ug | ORAL_TABLET | Freq: Every day | ORAL | Status: DC
  Administered 2022-08-07 – 2022-08-10 (×4): 100 ug via ORAL
  Filled 2022-08-06 (×4): qty 1

## 2022-08-06 NOTE — Progress Notes (Signed)
Changed dressing to RLE to include cleansed with normal saline, applied xeroform, abd pads, kerlex, and ace bandage.  Top of right foot has a new blister.  Applied petroleum gauze and foam to R and L great toes.

## 2022-08-06 NOTE — Evaluation (Signed)
Occupational Therapy Evaluation and Discharge Patient Details Name: Carl Hill MRN: [id number] DOB: 08/01/1970 Today's Date: 08/06/2022   History of Present Illness Pt is 52 yo male who presents on 08/04/22 after falling at home and having R knee pain. Pt with RLE cellulitis. Xrays showed nondisplaced transverse fx of the distal fibula. 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 He currently is in need of A for LBADLs due to new distal fibula fracture--thus needing A for washing lower legs/feet and getting socks/shoe/cam boot on. No further OT needs due to patient has his mother who can A with these tasks. Acute OT will sign off.     Recommendations for follow up therapy are one component of a multi-disciplinary discharge planning process, led by the attending physician.  Recommendations may be updated based on patient status, additional functional criteria and insurance authorization.   Follow Up Recommendations  No OT follow up     Assistance Recommended at Discharge PRN  Patient can return home with the following A little help with bathing/dressing/bathroom    Functional Status Assessment  Patient has had a recent decline in their functional status and demonstrates the ability to make significant improvements in function in a reasonable and predictable amount of time. (without further need for skilled OT services, pt's mom can A him with LBADLs prn until he can do them again by himself)  Equipment Recommendations  None recommended by OT       Precautions / Restrictions Precautions Precautions: Fall Required Braces or Orthoses: Other Brace Other Brace: cam boot on RLE for ambulation Restrictions Weight Bearing  Restrictions: No      Mobility Bed Mobility Overal bed mobility: Modified Independent                  Transfers Overall transfer level: Needs assistance Equipment used: Rolling walker (2 wheels) Transfers: Sit to/from Stand Sit to Stand: Supervision                  Balance Overall balance assessment: Mild deficits observed, not formally tested                                         ADL either performed or assessed with clinical judgement   ADL Overall ADL's : Needs assistance/impaired Eating/Feeding: Independent;Sitting   Grooming: Supervision/safety;Standing   Upper Body Bathing: Set up;Sitting   Lower Body Bathing: Minimal assistance Lower Body Bathing Details (indicate cue type and reason): A to wash feet; S sit<>stand with bari RW Upper Body Dressing : Set up;Supervision/safety   Lower Body Dressing: Moderate assistance Lower Body Dressing Details (indicate cue type and reason): A for socks, R shoe, L cam boot, S sit<>stand with bari RW Toilet Transfer: Min guard;Ambulation Toilet Transfer Details (indicate cue type and reason): bari RW, simulated from bed>out into hallway>sit in bari recliner Toileting- Clothing Manipulation and Hygiene: Supervision/safety;Sit to/from stand               Vision Patient Visual Report: No change from baseline              Pertinent Vitals/Pain Pain Assessment Pain Assessment: Faces Faces Pain  Scale: Hurts even more Pain Location: right knee intermittently Pain Descriptors / Indicators: Sharp, Shooting Pain Intervention(s): Limited activity within patient's tolerance, Monitored during session, Repositioned     Hand Dominance Right   Extremity/Trunk Assessment Upper Extremity Assessment Upper Extremity Assessment: Overall WFL for tasks assessed           Communication Communication Communication: No difficulties   Cognition Arousal/Alertness: Awake/alert Behavior During  Therapy: WFL for tasks assessed/performed Overall Cognitive Status: Within Functional Limits for tasks assessed                                       General Comments  reports balance deficits pta due to 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      Prior Functioning/Environment Prior Level of Function : Independent/Modified Independent                        OT Problem List: Impaired balance (sitting and/or standing);Obesity;Pain         OT Goals(Current goals can be found in the care plan section) Acute Rehab OT Goals Patient Stated Goal: to go home  OT Frequency:      Co-evaluation PT/OT/SLP Co-Evaluation/Treatment: Yes Reason for Co-Treatment: For patient/therapist safety;To address functional/ADL transfers PT goals addressed during session: Mobility/safety with mobility;Balance;Proper use of DME;Strengthening/ROM OT goals addressed during session: Strengthening/ROM;ADL's and self-care      AM-PAC OT "6 Clicks" Daily Activity     Outcome Measure Help from another person eating meals?: None Help from another person taking care of personal grooming?: A Little Help from another person toileting, which includes using toliet, bedpan, or urinal?: A Little Help from another person bathing (including washing, rinsing, drying)?: A Little Help from another person to put on  and taking off regular upper body clothing?: A Little Help from another person to put on and taking off regular lower body clothing?: A Lot 6 Click Score: 18   End of Session Equipment Utilized During Treatment: Rolling walker (2 wheels) (bari) Nurse Communication: Mobility status (with NT)  Activity Tolerance: Patient tolerated treatment well Patient left: in chair;with call bell/phone within reach  OT Visit Diagnosis: Unsteadiness on feet (R26.81);Other abnormalities of gait and mobility (R26.89);Pain Pain - Right/Left: Right Pain - part of body: Knee (interm[place]lty)                Time: [id number] OT Time Calculation (min): 35 min Charges:  OT General Charges $OT Visit: 1 Visit OT Evaluation $OT Eval Moderate Complexity: 1 [place] Office [phone]    Almon Register 08/06/2022, 10:45 AM

## 2022-08-06 NOTE — Evaluation (Signed)
Physical Therapy Evaluation Patient Details Name: Carl Hill MRN: [id number] DOB: 04-24-1971 Today's Date: 08/06/2022  History of Present Illness  Pt is 51 yo male who presents on 08/04/22 after falling at home and having R knee pain. Pt with RLE cellulitis. Xrays showed nondisplaced transverse fx of the distal fibula. 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 Pt received in bari bed, bari chair present in hallway and assisted pt into it end of session. Bari RW also obtained for pt however, it is really not tall enough for him at 6"6" but do not have a tall bari RW. Pt c/o RLE pain 5/10 at baseline with neuropathy but shooting pain to 10/10 in R knee intermittently. Pt denies pain R ankle. CAM boot donned for ambulation with L shoe to equalize. Pt ambulated 17' with RW and min A +2 for safety and needed to sit at that point with 3/4 DOE and HR 135 bpm. Anticipate d/c home without PT needs.  Pt currently with functional limitations due to the deficits listed below (see PT Problem List). Pt will benefit from skilled PT to increase their independence and safety with mobility to allow discharge to the venue listed below.          Recommendations for follow up therapy are one component of a multi-disciplinary discharge planning process, led by the attending physician.  Recommendations may be updated based on patient status, additional functional criteria and insurance authorization.  Follow Up Recommendations No PT follow up      Assistance Recommended at Discharge PRN  Patient can return home with the following  A little help with walking and/or transfers;A little help with bathing/dressing/bathroom;Assistance with cooking/housework;Help with stairs or ramp for entrance    Equipment Recommendations None  recommended by PT  Recommendations for Other Services       Functional Status Assessment Patient has had a recent decline in their functional status and demonstrates the ability to make significant improvements in function in a reasonable and predictable amount of time.     Precautions / Restrictions Precautions Precautions: Fall Required Braces or Orthoses: Other Brace Other Brace: cam boot on RLE for ambulation Restrictions Weight Bearing Restrictions: Yes RLE Weight Bearing: Weight bearing as tolerated (in CAM boot)      Mobility  Bed Mobility Overal bed mobility: Modified Independent                  Transfers Overall transfer level: Needs assistance Equipment used: Rolling walker (2 wheels) Transfers: Sit to/from Stand Sit to Stand: Supervision           General transfer comment: supervision for safety    Ambulation/Gait Ambulation/Gait assistance: Supervision Gait Distance (Feet): 50 Feet Assistive device: Rolling walker (2 wheels) Gait Pattern/deviations: Step-through pattern, Wide base of support Gait velocity: decreased Gait velocity interpretation: <1.8 ft/sec, indicate of risk for recurrent falls   General Gait Details: had some difficulty keeping RLE within RW with CAM boot. Flexed trunk as RW does not go tall enough for his 6'6" frame. Pt fatigues quickly with DOE up to 3/4  Stairs            Wheelchair Mobility    Modified Rankin (Stroke Patients Only)       Balance Overall balance assessment: History of Falls, Needs assistance Sitting-balance support: No upper extremity supported, Feet supported Sitting balance-Leahy Scale:  Good     Standing balance support: Bilateral upper extremity supported, During functional activity Standing balance-Leahy Scale: Fair Standing balance comment: pt with decreased proprioception BLE's resulting in balance deficits at baseline. Pt with R CAM boot and L shoe for ambulation                              Pertinent Vitals/Pain Pain Assessment Pain Assessment: Faces Faces Pain Scale: Hurts even more Pain Location: right knee intermittently Pain Descriptors / Indicators: Sharp, Shooting Pain Intervention(s): Limited activity within patient's tolerance, Monitored during session    Home Living Family/patient expects to be discharged to:: Private residence Living Arrangements: Parent (mom) Available Help at Discharge: Family;Available 24 hours/day Type of Home: House Home Access: Stairs to enter Entrance Stairs-Rails: Left;Can reach Advertising account executive of Steps: 8   Home Layout: One level Home Equipment: Rollator (4 wheels);Rolling Walker (2 wheels);Cane - single point;Shower seat Additional Comments: Uses bari RW inside, bari rollator outside.    Prior Function Prior Level of Function : Independent/Modified Independent                     Hand Dominance   Dominant Hand: Right    Extremity/Trunk Assessment   Upper Extremity Assessment Upper Extremity Assessment: Defer to OT evaluation    Lower Extremity Assessment Lower Extremity Assessment: RLE deficits/detail RLE Deficits / Details: peripheral neuropathy BLE's which he states is a constant 5/10 and jumps to 10/10 intermittently, esp at his R knee at this time. He has no pain in ankle with fibula fx. Wound noted distal RLE with swelling at ankle.  Hip flex 3+/5, knee ext 3+/5 RLE Sensation: history of peripheral neuropathy RLE Coordination: decreased gross motor LLE Deficits / Details: hip flex 3+/5, knee ext 3+/5    Cervical / Trunk Assessment Cervical / Trunk Assessment: Other exceptions Cervical / Trunk Exceptions: morbidly obese  Communication   Communication: No difficulties  Cognition Arousal/Alertness: Awake/alert Behavior During Therapy: WFL for tasks assessed/performed Overall Cognitive Status: Within Functional Limits for tasks assessed                                  General Comments: pt quite verbose and able to relay his medical history, good knowledge of current limitations        General Comments General comments (skin integrity, edema, etc.): HR up to 135 bpm, SPO2 in 90's. Mild balance deficits noted with peripheral neuropathy    Exercises     Assessment/Plan    PT Assessment Patient needs continued PT services  PT Problem List Decreased activity tolerance;Decreased balance;Decreased mobility;Impaired sensation;Obesity;Decreased skin integrity;Pain       PT Treatment Interventions DME instruction;Gait training;Stair training;Functional mobility training;Therapeutic activities;Therapeutic exercise;Balance training;Patient/family education    PT Goals (Current goals can be found in the Care Plan section)  Acute Rehab PT Goals Patient Stated Goal: return home PT Goal Formulation: With patient Time For Goal Achievement: 08/20/22 Potential to Achieve Goals: Good    Frequency Min 3X/week     Co-evaluation PT/OT/SLP Co-Evaluation/Treatment: Yes Reason for Co-Treatment: For patient/therapist safety;To address functional/ADL transfers PT goals addressed during session: Mobility/safety with mobility;Balance;Proper use of DME;Strengthening/ROM OT goals addressed during session: Strengthening/ROM;ADL's and self-care       AM-PAC PT "6 Clicks" Mobility  Outcome Measure Help needed turning from your back to your side while in a flat  bed without using bedrails?: None Help needed moving from lying on your back to sitting on the side of a flat bed without using bedrails?: None Help needed moving to and from a bed to a chair (including a wheelchair)?: A Little Help needed standing up from a chair using your arms (e.g., wheelchair or bedside chair)?: A Little Help needed to walk in hospital room?: A Little Help needed climbing 3-5 steps with a railing? : A Little 6 Click Score: 20    End of Session   Activity Tolerance: Patient  tolerated treatment well Patient left: in chair;with call bell/phone within reach (barichair) Nurse Communication: Mobility status PT Visit Diagnosis: Unsteadiness on feet (R26.81);History of falling (Z91.81);Pain;Difficulty in walking, not elsewhere classified (R26.2) Pain - Right/Left: Right Pain - part of body: Leg    Time: [id number] PT Time Calculation (min) (ACUTE ONLY): 32 min   Charges:   PT Evaluation $PT Eval Moderate Complexity: [place] chat preferred Office [facility] 08/06/2022, 2:23 PM

## 2022-08-06 NOTE — TOC Initial Note (Signed)
Transition of Care [facility]) - Initial/Assessment Note    Patient Details  Name: Carl Hill MRN: [id number] Date of Birth: 05/30/1971  Transition of Care [facility]) CM/SW Contact:    Levonne Lapping, RN Phone Number: 08/06/2022, 2:40 PM  Clinical Narrative:       Transition of Care [facility]) Screening Note   Patient Details  Name: Carl Hill Date of Birth: Oct 17, 1970   Transition of Care [facility]) CM/SW Contact:    Levonne Lapping, RN Phone Number: 08/06/2022, 2:40 PM    Transition of Care Department [facility]) has reviewed patient and no TOC needs have been identified at this time. We will continue to monitor patient advancement through interdisciplinary progression rounds. If new patient transition needs arise, please place a TOC consult.                   Barriers to Discharge: Continued Medical Work up   Patient Goals and CMS Choice Patient states their goals for this hospitalization and ongoing recovery are:: "Fix my leg up and go home"          Expected Discharge Plan and Services                                              Prior Living Arrangements/Services                       Activities of Daily Living Home Assistive Devices/Equipment: Environmental consultant (specify type) (front wheel walker; rollator when out in the community) ADL Screening (condition at time of admission) Patient's cognitive ability adequate to safely complete daily activities?: Yes Is the patient deaf or have difficulty hearing?: No Does the patient have difficulty seeing, even when wearing glasses/contacts?: No Does the patient have difficulty concentrating, remembering, or making decisions?: No Patient able to express need for assistance with ADLs?: Yes Does the patient have difficulty dressing or bathing?: Yes Independently performs ADLs?: No Communication: Independent Dressing (OT): Independent Grooming: Independent Feeding: Independent Bathing: Independent Toileting: Needs  assistance Is this a change from baseline?: Pre-admission baseline In/Out Bed: Independent with device (comment) (walker) Walks in Home: Independent with device (comment) (walker) Does the patient have difficulty walking or climbing stairs?: Yes Weakness of Legs: Right Weakness of Arms/Hands: None  Permission Sought/Granted                  Emotional Assessment              Admission diagnosis:  Cellulitis of right leg [L03.115] AKI (acute kidney injury) ([place]) [N17.9] Morbid obesity with BMI of 60.0-69.9, adult ([place]) [E66.01, Z68.44] Cellulitis, unspecified cellulitis site [L03.90] Closed displaced fracture of metatarsal bone of right foot, unspecified metatarsal, initial encounter [S92.301A] Closed fracture of distal end of right fibula, unspecified fracture morphology, initial encounter [id number] Patient Active Problem List   Diagnosis Date Noted   Cellulitis of right leg 08/05/2022   Closed fracture of distal fibula - right 08/05/2022   Adrenal insufficiency ([place]) - secondary 05/14/2021   DM2 (diabetes mellitus, type 2) ([place]) 12/10/2020   H/O small bowel obstruction    Muscle spasms of both lower extremities 08/15/2020   Recurrent knee instability, 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   Super-super obese ([place])    Urinary retention    Diabetic peripheral neuropathy ([place])    Sacral pain    Debility 08/06/2020  Diabetic ulcer of heel ([employer]) 06/10/2020   Atrial fibrillation, chronic ([place]) 06/10/2020   Essential hypertension 06/10/2020   Dyslipidemia 06/10/2020   Acquired hypothyroidism 06/10/2020   Chronic pain disorder 06/10/2020   Morbid obesity with BMI of 60.0-69.9, adult ([place]) 06/10/2020   Chronic venous insufficiency 04/09/2020   PCP:  Lilian Coma., MD Pharmacy:   [facility] [address] [place] [address] Phone: [phone] Fax: [phone]     Social Determinants of Health (SDOH) Social History: SDOH  Screenings   Food Insecurity: No Food Insecurity (08/05/2022)  Housing: Low Risk  (08/05/2022)  Transportation Needs: Unmet Transportation Needs (08/05/2022)  Utilities: Not At Risk (08/05/2022)  Depression (PHQ2-9): Low Risk  (10/17/2020)  Tobacco Use: High Risk (08/05/2022)   SDOH Interventions: Transportation Interventions: Intervention Not Indicated (pt declined interventions and states he "makes too much money" from disability to qualify for transporation needs; states mother will be driving him to appointments after discharge)   Readmission Risk Interventions    07/26/2020    1:23 PM  Readmission Risk Prevention Plan  Transportation Screening Complete  PCP or Specialist Appt within 3-5 Days Complete  HRI or [place] Complete  Social Work Consult for [place] Planning/Counseling Complete  Palliative Care Screening Complete  Medication Review Press photographer) Complete

## 2022-08-06 NOTE — Progress Notes (Signed)
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 He states that he was going to the bathroom. He states that he fell onto his right lower leg. This hit the floor. He states that top of his right knee hit the baseboard electric heater. He required assistance to get up as he weighs close to 500 pounds.   He still lives at home with his parents.   Assessment & Plan:   Principal Problem:   Cellulitis of right leg Active Problems:   Atrial fibrillation, chronic (HCC)   Essential hypertension   Dyslipidemia   Acquired hypothyroidism   Chronic pain disorder   Morbid obesity with BMI of 60.0-69.9, adult ([place])   Super-super obese (HCC)   DM2 (diabetes mellitus, type 2) (HCC)   Adrenal insufficiency (HCC) - secondary   Closed fracture of distal fibula - right   Cellulitis of right leg -IV Abx with Ancef -CT confirms subcutaneous hematomas, multiple nondisplaced fractures most notably through the cuboid and fifth metatarsal   Closed fracture of distal fibula/5th metatarsal - right Secondary to fall, nonoperative, continue boot with ambulation  Adrenal insufficiency ([place]) - secondary Continue with prednisone 5 mg daily.  If he becomes hypotensive, give IV solumedrol 60 mg or IV solucortef 100 mg.   DM2 (diabetes mellitus, type 2) (HCC) uncontrolled with hyperglycemia Likely exacerbated by dietary noncompliance Continue to titrate insulin as appropriate -sliding scale ongoing, 50 unit twice daily glargine  Super obese ([place]) Morbid obesity with BMI of 60.0-69.9, adult  (HCC) Weight 235 kg, BMI 60 Lengthy discussion on weight loss   Chronic pain disorder Continue lyrica 200 mg tid   Acquired hypothyroidism Continue synthyroid 75 mcg daily.   Dyslipidemia Continue lipitor 20 mg and zetia 10 mg daily.   Essential hypertension No medication listed on home med rec.   Atrial fibrillation, chronic (HCC) Chronic. On xarelto for CVA prophylaxis. On amiodarone.  DVT prophylaxis: xarelto Code Status: Full Family Communication: None  Status is: Inpt  Dispo: The patient is from: Home              Anticipated d/c is to: Home              Anticipated d/c date is: 48-72h              Patient currently NOT medically stable for discharge  Consultants:  None  Procedures:  None  Antimicrobials:  ancef   Subjective: No acute issues/events overnight, patient denies nausea vomiting diarrhea constipation headache fevers chills or chest pain.  Objective: Vitals:   08/06/22 0000 08/06/22 0001 08/06/22 0500 08/06/22 0543  BP: 125/87 125/87  (!) 147/68  Pulse: 100 100  100  Resp: '12 14  17  '$ Temp: 97.7 F (36.5 C) 97.7 F (36.5 C)  98 F (36.7 C)  TempSrc: Oral Oral  Oral  SpO2: 90% 92%  95%  Weight:   (!) 235.8 kg   Height:        Intake/Output Summary (Last 24 hours) at 08/06/2022 0734 Last data filed at 08/05/2022 2308 Gross per 24 hour  Intake  100 ml  Output --  Net 100 ml   Filed Weights   08/04/22 2144 08/04/22 2251 08/06/22 0500  Weight: (!) 235.9 kg (!) 235.9 kg (!) 235.8 kg    Examination:  General: Super obese gentleman pleasantly resting in bed, No acute distress. HEENT:  Normocephalic atraumatic.  Sclerae nonicteric, noninjected.  Extraocular movements intact bilaterally. Neck:  Without mass or deformity.  Trachea is midline. Lungs:  Clear to auscultate bilaterally without rhonchi, wheeze, or rales. Heart:  Regular rate and rhythm.  Without murmurs, rubs, or gallops. Abdomen:  Soft, nontender, nondistended.  Without guarding  or rebound. Skin: see attached images for BLE wounds: RLE:   LLE: anterior knee     Data Reviewed: I have personally reviewed following labs and imaging studies  CBC: Recent Labs  Lab 08/04/22 2300  WBC 15.6*  HGB 13.9  HCT 43.8  MCV 84.7  PLT [id number]    Basic Metabolic Panel: Recent Labs  Lab 08/04/22 2300 08/05/22 0155  NA 138  --   K 4.1  --   CL 102  --   CO2 20*  --   GLUCOSE 214*  --   BUN 15  --   CREATININE 1.44*  --   CALCIUM 8.5*  --   MG  --  1.6*    GFR: Estimated Creatinine Clearance: 129.5 mL/min (A) (by C-G formula based on SCr of 1.44 mg/dL (H)). Liver Function Tests: Recent Labs  Lab 08/05/22 0155  AST 29  ALT 17  ALKPHOS 76  BILITOT 0.4  PROT 7.0  ALBUMIN 2.8*    Sepsis Labs: Recent Labs  Lab 08/05/22 0155  LATICACIDVEN 3.4*     No results found for this or any previous visit (from the past 240 hour(s)).   Radiology Studies: CT FOOT RIGHT W CONTRAST  Result Date: 08/05/2022 CLINICAL DATA:  Right foot swelling. Diabetic with osteomyelitis suspected. EXAM: CT OF THE LOWER RIGHT EXTREMITY WITH CONTRAST TECHNIQUE: Multidetector CT imaging of the lower right extremity was performed from the distal femoral diametaphysis through the foot according to the standard protocol following intravenous contrast administration. RADIATION DOSE REDUCTION: This exam was performed according to the departmental dose-optimization program which includes automated exposure control, adjustment of the mA and/or kV according to patient size and/or use of iterative reconstruction technique. CONTRAST:  157m OMNIPAQUE IOHEXOL 350 MG/ML SOLN COMPARISON:  Right ankle and foot films from yesterday. FINDINGS: Bones/Joint/Cartilage There is diffuse osteopenia. Acute or least recent transverse nondisplaced fracture of the lateral malleolar tip is noted, centered anteriorly with a small chip fracture off the posterior aspect of the lateral malleolar tip, the latter  distracted a few mm from the parent bone. In the foot, there is a recent nondisplaced transverse fracture extending AP through the medial aspect of the cuboid bone, and a recent nondisplaced oblique fracture of the distal fifth metatarsal shaft which was also noted on the plain radiographs. There are no apparent joint effusions at the knee and ankle and no further evidence of fractures. No erosive or destructive bone lesion is seen suspicious for acute osteomyelitis. There is tricompartmental degenerative arthrosis of the knee, with joint space loss greatest along the medial femorotibial joint and the lateral patellofemoral joint, with small tricompartmental marginal spurs. At the ankle, there is narrowing along the medial tibiotalar joint most likely due to cartilage loss, and mild midfoot arthrosis as well as hammertoe deformities of the second through fifth toes and mild nonerosive degenerative arthrosis of the forefoot. Ligaments Suboptimally  assessed by CT. Muscles and Tendons There is moderate partial fatty atrophy in the distal thigh musculature and superior foreleg musculature and advanced marked fatty atrophy of the mid to distal foreleg muscles and intrinsic foot muscles. Area tendons are not well seen with this technique but grossly intact, as visualized. Soft tissues Subcutaneously in the anterior upper foreleg there is high density fluid measuring 77 Hounsfield units collecting subcutaneously most likely representing a hematoma measuring 13.4 cm transverse, 1.7 cm AP, and 8.9 cm craniocaudal. There is diffuse edema in the ankle and foot, at the ankle is greatest laterally and over the foot is greatest superiorly. There is additional dense fluid collecting subcutaneously at the lateral ankle and also is most likely due to subcutaneous hematoma, estimated to measure 7.9 cm AP, 13.1 cm coronal and 6.6 cm craniocaudal. There are scattered phleboliths in the anteromedial soft tissues of the foreleg and  laterally at the level of the ankle. Also noted is a raised skin lesion of indeterminate etiology in the anterior aspect of the distal foreleg, measuring 5.7 cm craniocaudal, 4.4 cm transversely and 0.6 cm height, and could be a blister but is nonspecific. There is no evidence of an abscess, soft tissue gas, or soft tissue mass. IMPRESSION: 1. Osteopenia and degenerative changes. 2. No erosive or destructive bone lesion is seen suspicious for acute osteomyelitis. 3. Acute or least recent transverse nondisplaced fracture of the lateral malleolar tip with a small chip fracture off the posterior aspect of the lateral malleolar tip. 4. Recent nondisplaced transverse fracture extending AP through the medial aspect of the cuboid bone, and recent nondisplaced oblique fracture of the distal fifth metatarsal shaft. 5. Subcutaneous hematomas in the anterior upper foreleg and lateral ankle. 6. Diffuse edema in the ankle and foot. 7. 5.7 x 4.4 x 0.6 cm raised skin lesion in the anterior aspect of the distal foreleg, nonspecific. 8. Marked fatty atrophy of the mid to distal foreleg muscles and intrinsic foot muscles. Electronically Signed   By: Telford Nab M.D.   On: 08/05/2022 07:11   CT TIBIA FIBULA RIGHT W CONTRAST  Result Date: 08/05/2022 CLINICAL DATA:  Right foot swelling. Diabetic with osteomyelitis suspected. EXAM: CT OF THE LOWER RIGHT EXTREMITY WITH CONTRAST TECHNIQUE: Multidetector CT imaging of the lower right extremity was performed from the distal femoral diametaphysis through the foot according to the standard protocol following intravenous contrast administration. RADIATION DOSE REDUCTION: This exam was performed according to the departmental dose-optimization program which includes automated exposure control, adjustment of the mA and/or kV according to patient size and/or use of iterative reconstruction technique. CONTRAST:  161m OMNIPAQUE IOHEXOL 350 MG/ML SOLN COMPARISON:  Right ankle and foot films  from yesterday. FINDINGS: Bones/Joint/Cartilage There is diffuse osteopenia. Acute or least recent transverse nondisplaced fracture of the lateral malleolar tip is noted, centered anteriorly with a small chip fracture off the posterior aspect of the lateral malleolar tip, the latter distracted a few mm from the parent bone. In the foot, there is a recent nondisplaced transverse fracture extending AP through the medial aspect of the cuboid bone, and a recent nondisplaced oblique fracture of the distal fifth metatarsal shaft which was also noted on the plain radiographs. There are no apparent joint effusions at the knee and ankle and no further evidence of fractures. No erosive or destructive bone lesion is seen suspicious for acute osteomyelitis. There is tricompartmental degenerative arthrosis of the knee, with joint space loss greatest along the medial femorotibial joint and the lateral patellofemoral  joint, with small tricompartmental marginal spurs. At the ankle, there is narrowing along the medial tibiotalar joint most likely due to cartilage loss, and mild midfoot arthrosis as well as hammertoe deformities of the second through fifth toes and mild nonerosive degenerative arthrosis of the forefoot. Ligaments Suboptimally assessed by CT. Muscles and Tendons There is moderate partial fatty atrophy in the distal thigh musculature and superior foreleg musculature and advanced marked fatty atrophy of the mid to distal foreleg muscles and intrinsic foot muscles. Area tendons are not well seen with this technique but grossly intact, as visualized. Soft tissues Subcutaneously in the anterior upper foreleg there is high density fluid measuring 77 Hounsfield units collecting subcutaneously most likely representing a hematoma measuring 13.4 cm transverse, 1.7 cm AP, and 8.9 cm craniocaudal. There is diffuse edema in the ankle and foot, at the ankle is greatest laterally and over the foot is greatest superiorly. There is  additional dense fluid collecting subcutaneously at the lateral ankle and also is most likely due to subcutaneous hematoma, estimated to measure 7.9 cm AP, 13.1 cm coronal and 6.6 cm craniocaudal. There are scattered phleboliths in the anteromedial soft tissues of the foreleg and laterally at the level of the ankle. Also noted is a raised skin lesion of indeterminate etiology in the anterior aspect of the distal foreleg, measuring 5.7 cm craniocaudal, 4.4 cm transversely and 0.6 cm height, and could be a blister but is nonspecific. There is no evidence of an abscess, soft tissue gas, or soft tissue mass. IMPRESSION: 1. Osteopenia and degenerative changes. 2. No erosive or destructive bone lesion is seen suspicious for acute osteomyelitis. 3. Acute or least recent transverse nondisplaced fracture of the lateral malleolar tip with a small chip fracture off the posterior aspect of the lateral malleolar tip. 4. Recent nondisplaced transverse fracture extending AP through the medial aspect of the cuboid bone, and recent nondisplaced oblique fracture of the distal fifth metatarsal shaft. 5. Subcutaneous hematomas in the anterior upper foreleg and lateral ankle. 6. Diffuse edema in the ankle and foot. 7. 5.7 x 4.4 x 0.6 cm raised skin lesion in the anterior aspect of the distal foreleg, nonspecific. 8. Marked fatty atrophy of the mid to distal foreleg muscles and intrinsic foot muscles. Electronically Signed   By: Telford Nab M.D.   On: 08/05/2022 07:11   CT Head Wo Contrast  Result Date: 08/05/2022 CLINICAL DATA:  Fall. EXAM: CT HEAD WITHOUT CONTRAST TECHNIQUE: Contiguous axial images were obtained from the base of the skull through the vertex without intravenous contrast. RADIATION DOSE REDUCTION: This exam was performed according to the departmental dose-optimization program which includes automated exposure control, adjustment of the mA and/or kV according to patient size and/or use of iterative reconstruction  technique. COMPARISON:  11/05/2020 FINDINGS: Brain: No acute intracranial abnormality. Specifically, no hemorrhage, hydrocephalus, mass lesion, acute infarction, or significant intracranial injury. Vascular: No hyperdense vessel or unexpected calcification. Skull: No acute calvarial abnormality. Sinuses/Orbits: No acute findings Other: None IMPRESSION: No acute intracranial abnormality. Electronically Signed   By: Rolm Baptise M.D.   On: 08/05/2022 01:54   DG Chest Portable 1 View  Result Date: 08/04/2022 CLINICAL DATA:  Fall. EXAM: PORTABLE CHEST 1 VIEW COMPARISON:  12/10/2020 FINDINGS: Stable heart size and mediastinal contours. Suspected right infrahilar scarring. No acute airspace disease. No significant pleural effusion. No visualized pneumothorax. Soft tissue attenuation from habitus limits detailed assessment. IMPRESSION: No acute findings. Suspected right infrahilar scarring. Electronically Signed   By: Aurther Loft.D.  On: 08/04/2022 23:44   DG Ankle Complete Right  Result Date: 08/04/2022 CLINICAL DATA:  Fall. EXAM: RIGHT ANKLE - COMPLETE 3+ VIEW COMPARISON:  None Available. FINDINGS: Transverse nondisplaced fracture through the distal aspect of the distal fibula. This is distal to the ankle mortise. No mortise widening. No additional fracture of the ankle. No definite ankle joint effusion. There is generalized soft tissue edema which is more prominent laterally. Soft tissue phleboliths. IMPRESSION: Nondisplaced transverse fracture through the distal aspect of the distal fibula. Adjacent soft tissue edema. Electronically Signed   By: Keith Rake M.D.   On: 08/04/2022 23:42   DG Tibia/Fibula Right  Result Date: 08/04/2022 CLINICAL DATA:  Fall. EXAM: RIGHT TIBIA AND FIBULA - 2 VIEW COMPARISON:  None Available. FINDINGS: There is no evidence of fracture or other focal bone lesions. The cortical margins of the tibia and fibula are intact. Multiple phleboliths in the soft tissues.  Additional sheet like calcifications anteriorly over the mid lower leg. Soft tissue prominence and edema overlies the anterior upper aspect of the lower leg. IMPRESSION: 1. Soft tissue prominence and edema over the anterior upper aspect of the lower leg. No acute osseous abnormality. 2. Soft tissue phleboliths with additional sheet like calcifications anteriorly over the mid lower leg. Electronically Signed   By: Keith Rake M.D.   On: 08/04/2022 23:41   DG Knee 2 Views Right  Result Date: 08/04/2022 CLINICAL DATA:  Fall. EXAM: RIGHT KNEE - 1-2 VIEW COMPARISON:  Radiograph 09/25/2020 FINDINGS: No fracture or dislocation. Mild medial tibiofemoral joint space narrowing. Tricompartmental peripheral spurring. No significant knee joint effusion. Soft tissue edema versus habitus. IMPRESSION: 1. No fracture or dislocation. 2. Mild tricompartmental osteoarthritis. Electronically Signed   By: Keith Rake M.D.   On: 08/04/2022 23:39   DG Foot Complete Right  Result Date: 08/04/2022 CLINICAL DATA:  Recent fall with right foot pain, initial encounter EXAM: RIGHT FOOT COMPLETE - 3+ VIEW COMPARISON:  None Available. FINDINGS: Curvilinear lucency is noted in the midportion of the fifth metatarsal consistent with undisplaced fracture. No other fracture is seen. No soft tissue abnormality is noted. Loss of the first distal phalangeal tuft is noted felt to be of a chronic nature. IMPRESSION: Undisplaced fifth metatarsal fracture Electronically Signed   By: Inez Catalina M.D.   On: 08/04/2022 23:38    Scheduled Meds:  amiodarone  200 mg Oral Daily   atorvastatin  20 mg Oral QPM   diltiazem  240 mg Oral Daily   DULoxetine  20 mg Oral BID   ezetimibe  10 mg Oral Daily   hydrocerin   Topical Daily   influenza vac split quadrivalent PF  0.5 mL Intramuscular Tomorrow-1000   insulin aspart  0-15 Units Subcutaneous TID WC   insulin aspart  0-5 Units Subcutaneous QHS   insulin aspart  10 Units Subcutaneous TID WC    insulin glargine-yfgn  50 Units Subcutaneous BID   levothyroxine  75 mcg Oral Q0600   nortriptyline  75 mg Oral BID   pantoprazole  40 mg Oral Daily   predniSONE  5 mg Oral Daily   pregabalin  200 mg Oral TID   rivaroxaban  20 mg Oral Q supper   saccharomyces boulardii  250 mg Oral BID   tamsulosin  0.4 mg Oral Daily   Continuous Infusions:   ceFAZolin (ANCEF) IV 2 g (08/06/22 0539)     LOS: 1 day   Time spent: 100mn  Yarelis Ambrosino C Johnpatrick Jenny, DO [facility]  If 7PM-7AM, please contact night-coverage [email or web address]  08/06/2022, 7:34 AM

## 2022-08-06 NOTE — Plan of Care (Signed)
  Problem: Clinical Measurements: Goal: Ability to avoid or minimize complications of infection will improve Outcome: Progressing   Problem: Skin Integrity: Goal: Skin integrity will improve Outcome: Progressing   Problem: Education: Goal: Ability to describe self-care measures that may prevent or decrease complications (Diabetes Survival Skills Education) will improve Outcome: Progressing Goal: Individualized Educational Video(s) Outcome: Progressing   Problem: Education: Goal: Individualized Educational Video(s) Outcome: Progressing   Problem: Education: Goal: Individualized Educational Video(s) Outcome: Progressing   Problem: Coping: Goal: Ability to adjust to condition or change in health will improve Outcome: Progressing   Problem: Fluid Volume: Goal: Ability to maintain a balanced intake and output will improve Outcome: Progressing   Problem: Health Behavior/Discharge Planning: Goal: Ability to identify and utilize available resources and services will improve Outcome: Progressing Goal: Ability to manage health-related needs will improve Outcome: Progressing   Problem: Metabolic: Goal: Ability to maintain appropriate glucose levels will improve Outcome: Progressing   Problem: Nutritional: Goal: Maintenance of adequate nutrition will improve Outcome: Progressing Goal: Progress toward achieving an optimal weight will improve Outcome: Progressing   Problem: Skin Integrity: Goal: Risk for impaired skin integrity will decrease Outcome: Progressing   Problem: Tissue Perfusion: Goal: Adequacy of tissue perfusion will improve Outcome: Progressing   Problem: Education: Goal: Knowledge of General Education information will improve Description: Including pain rating scale, medication(s)/side effects and non-pharmacologic comfort measures Outcome: Progressing   Problem: Health Behavior/Discharge Planning: Goal: Ability to manage health-related needs will  improve Outcome: Progressing   Problem: Clinical Measurements: Goal: Ability to maintain clinical measurements within normal limits will improve Outcome: Progressing Goal: Will remain free from infection Outcome: Progressing Goal: Diagnostic test results will improve Outcome: Progressing Goal: Respiratory complications will improve Outcome: Progressing Goal: Cardiovascular complication will be avoided Outcome: Progressing   Problem: Activity: Goal: Risk for activity intolerance will decrease Outcome: Progressing   Problem: Nutrition: Goal: Adequate nutrition will be maintained Outcome: Progressing   Problem: Coping: Goal: Level of anxiety will decrease Outcome: Progressing   Problem: Elimination: Goal: Will not experience complications related to bowel motility Outcome: Progressing Goal: Will not experience complications related to urinary retention Outcome: Progressing   Problem: Pain Managment: Goal: General experience of comfort will improve Outcome: Progressing   Problem: Safety: Goal: Ability to remain free from injury will improve Outcome: Progressing   Problem: Skin Integrity: Goal: Risk for impaired skin integrity will decrease Outcome: Progressing

## 2022-08-07 LAB — GLUCOSE, CAPILLARY
Glucose-Capillary: 203 mg/dL — ABNORMAL HIGH (ref 70–99)
Glucose-Capillary: 263 mg/dL — ABNORMAL HIGH (ref 70–99)
Glucose-Capillary: 272 mg/dL — ABNORMAL HIGH (ref 70–99)

## 2022-08-07 MED ORDER — FUROSEMIDE 10 MG/ML IJ SOLN
60.0000 mg | Freq: Once | INTRAMUSCULAR | Status: AC
  Administered 2022-08-07: 60 mg via INTRAVENOUS
  Filled 2022-08-07: qty 6

## 2022-08-07 MED ORDER — INSULIN GLARGINE-YFGN 100 UNIT/ML ~~LOC~~ SOLN
60.0000 [IU] | Freq: Two times a day (BID) | SUBCUTANEOUS | Status: DC
  Administered 2022-08-07 – 2022-08-10 (×6): 60 [IU] via SUBCUTANEOUS
  Filled 2022-08-07 (×7): qty 0.6

## 2022-08-07 NOTE — Progress Notes (Signed)
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 He states that he was going to the bathroom. He states that he fell onto his right lower leg. This hit the floor. He states that top of his right knee hit the baseboard electric heater. He required assistance to get up as he weighs close to 500 pounds. He still lives at home with his parents who assist with ADLs and wound care.   Assessment & Plan:   Principal Problem:   Cellulitis of right leg Active Problems:   Atrial fibrillation, chronic (HCC)   Essential hypertension   Dyslipidemia   Acquired hypothyroidism   Chronic pain disorder   Morbid obesity with BMI of 60.0-69.9, adult ([place])   Super-super obese (HCC)   DM2 (diabetes mellitus, type 2) (HCC)   Adrenal insufficiency (HCC) - secondary   Closed fracture of distal fibula - right  Cellulitis of right leg -IV Abx with Ancef -CT confirms subcutaneous hematomas, multiple nondisplaced fractures (see below) -Continues to have large bullae surrounding infection, IV Lasix x 1 dose today to assist with volume status, recommending elevation of leg but patient is moderately noncompliant although ambulating without difficulty.   Closed fracture of distal fibula/5th metatarsal - right Secondary to fall, nonoperative, continue boot with ambulation  Adrenal insufficiency ([place]) - secondary Continue with prednisone 5 mg daily.  If he becomes hypotensive, give IV solumedrol 60 mg or IV solucortef 100 mg.   DM2 (diabetes mellitus, type 2) (HCC) uncontrolled with  hyperglycemia Likely exacerbated by dietary noncompliance Continue to titrate insulin as appropriate -sliding scale and 10u premeal ongoing, increase to 60 unit twice daily glargine  Super obese ([place]) Morbid obesity with BMI of 60.0-69.9, adult (HCC) Weight 235 kg, BMI 60 Lengthy discussion on weight loss   Chronic pain disorder Continue lyrica 200 mg tid   Acquired hypothyroidism Continue synthyroid 75 mcg daily.   Dyslipidemia Continue lipitor 20 mg and zetia 10 mg daily.   Essential hypertension No medication listed on home med rec. Well controlled for the most part, will have patient follow up with PCP for management.   Atrial fibrillation, chronic (HCC) Chronic, rate controlled on amiodarone.  Xarelto for CVA prophylaxis  DVT prophylaxis: xarelto Code Status: Full Family Communication: None  Status is: Inpt  Dispo: The patient is from: Home              Anticipated d/c is to: Home              Anticipated d/c date is: 48-72h              Patient currently NOT medically stable for discharge given ongoing infection requiring IV antibiotics  Consultants:  None  Procedures:  None  Antimicrobials:  ancef   Subjective: Patient reports extension of bullae over right lower lateral leg and foot, patient denies nausea vomiting diarrhea constipation headache fevers chills or chest pain.  Objective: Vitals:   08/07/22 0052 08/07/22 0353 08/07/22 0354 08/07/22 0754  BP: 135/79 126/76  123/75  Pulse: 94 93  95  Resp: 17 17  16  Temp: 97.9 F (36.6 C) (!) 97.4 F (36.3 C)  97.9 F (36.6 C)  TempSrc: Oral Oral  Oral  SpO2: 90% 92%  93%  Weight:   (!) 235.8 kg   Height:        Intake/Output Summary (Last 24 hours) at 08/07/2022 0759 Last data filed at 08/07/2022 [id number] Gross per 24 hour  Intake 800 ml  Output --  Net 800 ml    Filed Weights   08/04/22 2251 08/06/22 0500 08/07/22 0354  Weight: (!) 235.9 kg (!) 235.8 kg (!) 235.8 kg    Examination:  General:  Super obese gentleman pleasantly resting in bed, No acute distress. HEENT:  Normocephalic atraumatic.  Sclerae nonicteric, noninjected.  Extraocular movements intact bilaterally. Neck:  Without mass or deformity.  Trachea is midline. Lungs:  Clear to auscultate bilaterally without rhonchi, wheeze, or rales. Heart:  Regular rate and rhythm.  Without murmurs, rubs, or gallops. Abdomen:  Soft, nontender, nondistended.  Without guarding or rebound. Skin: see attached images for BLE wounds: RLE:    LLE: anterior knee     Data Reviewed: I have personally reviewed following labs and imaging studies  CBC: Recent Labs  Lab 08/04/22 2300 08/06/22 0646  WBC 15.6* 7.1  HGB 13.9 11.1*  HCT 43.8 36.2*  MCV 84.7 84.2  PLT 228 [id number]    Basic Metabolic Panel: Recent Labs  Lab 08/04/22 2300 08/05/22 0155 08/06/22 0646  NA 138  --  136  K 4.1  --  3.2*  CL 102  --  100  CO2 20*  --  26  GLUCOSE 214*  --  195*  BUN 15  --  7  CREATININE 1.44*  --  0.93  CALCIUM 8.5*  --  8.1*  MG  --  1.6*  --     GFR: Estimated Creatinine Clearance: 200.5 mL/min (by C-G formula based on SCr of 0.93 mg/dL). Liver Function Tests: Recent Labs  Lab 08/05/22 0155  AST 29  ALT 17  ALKPHOS 76  BILITOT 0.4  PROT 7.0  ALBUMIN 2.8*    Sepsis Labs: Recent Labs  Lab 08/05/22 0155  LATICACIDVEN 3.4*     Recent Results (from the past 240 hour(s))  Blood culture (routine x 2)     Status: None (Preliminary result)   Collection Time: 08/05/22  1:50 AM   Specimen: BLOOD  Result Value Ref Range Status   Specimen Description BLOOD LEFT ANTECUBITAL  Final   Special Requests   Final    BOTTLES DRAWN AEROBIC AND ANAEROBIC Blood Culture adequate volume   Culture   Final    NO GROWTH 2 DAYS Performed at [facility], [address] [place], [place] [address]    Report Status PENDING  Incomplete  Blood culture (routine x 2)     Status: None (Preliminary result)   Collection Time: 08/05/22   1:50 AM   Specimen: BLOOD  Result Value Ref Range Status   Specimen Description BLOOD LEFT ANTECUBITAL  Final   Special Requests   Final    BOTTLES DRAWN AEROBIC AND ANAEROBIC Blood Culture adequate volume   Culture   Final    NO GROWTH 2 DAYS Performed at [facility], [place] [address], [place], [place] [address]    Report Status PENDING  Incomplete     Radiology Studies: No results found.  Scheduled Meds:  amiodarone  200 mg Oral Daily   atorvastatin  20 mg Oral QPM   diltiazem  240 mg Oral Daily   DULoxetine  20 mg Oral BID   ezetimibe  10 mg Oral Daily   hydrocerin   Topical Daily   influenza vac split quadrivalent PF  0.5 mL Intramuscular Tomorrow-1000   insulin aspart  0-15 Units Subcutaneous TID WC   insulin aspart  0-5 Units Subcutaneous QHS   insulin aspart  10 Units Subcutaneous TID WC   insulin glargine-yfgn  50 Units Subcutaneous BID   levothyroxine  100 mcg Oral Q0600   nortriptyline  75 mg Oral BID   pantoprazole  40 mg Oral Daily   predniSONE  5 mg Oral Daily   pregabalin  200 mg Oral TID   rivaroxaban  20 mg Oral Q supper   saccharomyces boulardii  250 mg Oral BID   tamsulosin  0.4 mg Oral Daily   Continuous Infusions:   ceFAZolin (ANCEF) IV 2 g (08/07/22 0512)     LOS: 2 days   Time spent: 60mn  Kethan Papadopoulos C Esteven Overfelt, DO [facility]  If 7PM-7AM, please contact night-coverage [email or web address]  08/07/2022, 7:59 AM

## 2022-08-07 NOTE — Plan of Care (Signed)

## 2022-08-07 NOTE — Care Management Important Message (Signed)
Important Message  Patient Details  Name: Carl Hill MRN: [id number] Date of Birth: [birth year]   Medicare Important Message Given:  Yes     Hannah Beat 08/07/2022, 12:30 PM

## 2022-08-08 LAB — CBC
HCT: 35.8 % — ABNORMAL LOW (ref 39.0–52.0)
Hemoglobin: 11.3 g/dL — ABNORMAL LOW (ref 13.0–17.0)
MCH: 26.5 pg (ref 26.0–34.0)
MCHC: 31.6 g/dL (ref 30.0–36.0)
MCV: 84 fL (ref 80.0–100.0)
Platelets: 187 10*3/uL (ref 150–400)
RBC: 4.26 MIL/uL (ref 4.22–5.81)
RDW: 16.8 % — ABNORMAL HIGH (ref 11.5–15.5)
WBC: 8.2 10*3/uL (ref 4.0–10.5)
nRBC: 0.5 % — ABNORMAL HIGH (ref 0.0–0.2)

## 2022-08-08 LAB — BASIC METABOLIC PANEL
Anion gap: 14 (ref 5–15)
BUN: 6 mg/dL (ref 6–20)
CO2: 30 mmol/L (ref 22–32)
Calcium: 8.3 mg/dL — ABNORMAL LOW (ref 8.9–10.3)
Chloride: 96 mmol/L — ABNORMAL LOW (ref 98–111)
Creatinine, Ser: 1.06 mg/dL (ref 0.61–1.24)
GFR, Estimated: >60 mL/min (ref >60)
Glucose, Bld: 206 mg/dL — ABNORMAL HIGH (ref 70–99)
Potassium: 3.8 mmol/L (ref 3.5–5.1)
Sodium: 140 mmol/L (ref 135–145)

## 2022-08-08 LAB — GLUCOSE, CAPILLARY
Glucose-Capillary: 269 mg/dL — ABNORMAL HIGH (ref 70–99)
Glucose-Capillary: 207 mg/dL — ABNORMAL HIGH (ref 70–99)
Glucose-Capillary: 249 mg/dL — ABNORMAL HIGH (ref 70–99)

## 2022-08-08 MED ORDER — INSULIN ASPART 100 UNIT/ML IJ SOLN
15.0000 [IU] | Freq: Three times a day (TID) | INTRAMUSCULAR | Status: DC
  Administered 2022-08-08 – 2022-08-10 (×6): 15 [IU] via SUBCUTANEOUS

## 2022-08-08 NOTE — TOC CAGE-AID Note (Signed)
Transition of Care [facility]) - CAGE-AID Screening   Patient Details  Name: Carl Hill MRN: [id number] Date of Birth: 1970-11-12  Transition of Care [facility]) CM/SW Contact:    Clovis Cao, RN Phone Number: [phone] 08/08/2022, 2:01 PM   Clinical Narrative: Pt here after sustaining a leg injury secondary to a fall. Pt denies drug and alcohol use.  No resources needed.  Screening complete.    CAGE-AID Screening:    Have You Ever Felt You Ought to Cut Down on Your Drinking or Drug Use?: No Have People Annoyed You By Critizing Your Drinking Or Drug Use?: No Have You Felt Bad Or Guilty About Your Drinking Or Drug Use?: No Have You Ever Had a Drink or Used Drugs First Thing In The Morning to Steady Your Nerves or to Get Rid of a Hangover?: No CAGE-AID Score: 0  Substance Abuse Education Offered: No

## 2022-08-08 NOTE — Plan of Care (Signed)
  Problem: Clinical Measurements: Goal: Ability to avoid or minimize complications of infection will improve Outcome: Not Progressing   Problem: Skin Integrity: Goal: Skin integrity will improve Outcome: Not Progressing   Problem: Education: Goal: Ability to describe self-care measures that may prevent or decrease complications (Diabetes Survival Skills Education) will improve Outcome: Not Progressing Goal: Individualized Educational Video(s) Outcome: Not Progressing   Problem: Coping: Goal: Ability to adjust to condition or change in health will improve Outcome: Not Progressing   Problem: Fluid Volume: Goal: Ability to maintain a balanced intake and output will improve Outcome: Not Progressing   Problem: Health Behavior/Discharge Planning: Goal: Ability to identify and utilize available resources and services will improve Outcome: Not Progressing Goal: Ability to manage health-related needs will improve Outcome: Not Progressing   Problem: Metabolic: Goal: Ability to maintain appropriate glucose levels will improve Outcome: Not Progressing   Problem: Nutritional: Goal: Maintenance of adequate nutrition will improve Outcome: Not Progressing Goal: Progress toward achieving an optimal weight will improve Outcome: Not Progressing   Problem: Skin Integrity: Goal: Risk for impaired skin integrity will decrease Outcome: Not Progressing   Problem: Tissue Perfusion: Goal: Adequacy of tissue perfusion will improve Outcome: Not Progressing   Problem: Education: Goal: Knowledge of General Education information will improve Description: Including pain rating scale, medication(s)/side effects and non-pharmacologic comfort measures Outcome: Not Progressing   Problem: Health Behavior/Discharge Planning: Goal: Ability to manage health-related needs will improve Outcome: Not Progressing   Problem: Clinical Measurements: Goal: Ability to maintain clinical measurements within  normal limits will improve Outcome: Not Progressing Goal: Will remain free from infection Outcome: Not Progressing Goal: Diagnostic test results will improve Outcome: Not Progressing Goal: Respiratory complications will improve Outcome: Not Progressing Goal: Cardiovascular complication will be avoided Outcome: Not Progressing   Problem: Activity: Goal: Risk for activity intolerance will decrease Outcome: Not Progressing   Problem: Nutrition: Goal: Adequate nutrition will be maintained Outcome: Not Progressing   Problem: Coping: Goal: Level of anxiety will decrease Outcome: Not Progressing   Problem: Elimination: Goal: Will not experience complications related to bowel motility Outcome: Not Progressing Goal: Will not experience complications related to urinary retention Outcome: Not Progressing   Problem: Pain Managment: Goal: General experience of comfort will improve Outcome: Not Progressing   Problem: Safety: Goal: Ability to remain free from injury will improve Outcome: Not Progressing   Problem: Skin Integrity: Goal: Risk for impaired skin integrity will decrease Outcome: Not Progressing

## 2022-08-08 NOTE — Progress Notes (Signed)
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 He states that he was going to the bathroom. He states that he fell onto his right lower leg. This hit the floor. He states that top of his right knee hit the baseboard electric heater. He required assistance to get up as he weighs close to 500 pounds. He still lives at home with his parents who assist with ADLs and wound care.   Assessment & Plan:   Principal Problem:   Cellulitis of right leg Active Problems:   Atrial fibrillation, chronic (HCC)   Essential hypertension   Dyslipidemia   Acquired hypothyroidism   Chronic pain disorder   Morbid obesity with BMI of 60.0-69.9, adult ([place])   Super-super obese (HCC)   DM2 (diabetes mellitus, type 2) (HCC)   Adrenal insufficiency (HCC) - secondary   Closed fracture of distal fibula - right  Cellulitis of right leg -IV Abx with Ancef -CT confirms subcutaneous hematomas, multiple nondisplaced fractures (see below) -Continues to have large bullae surrounding infection, IV Lasix x 1 dose today to assist with volume status, recommending elevation of leg but patient is moderately noncompliant although ambulating without difficulty.   Closed fracture of distal fibula/5th metatarsal - right Secondary to fall, nonoperative, continue boot with ambulation  Adrenal insufficiency ([place]) - secondary Continue with prednisone 5 mg daily.  If he becomes hypotensive, give IV solumedrol 60 mg or IV solucortef 100 mg.   DM2 (diabetes mellitus, type 2) (HCC) uncontrolled with  hyperglycemia Likely exacerbated by dietary noncompliance Continue to titrate insulin as appropriate -sliding scale and 10u premeal ongoing, increase to 60 unit twice daily glargine  Super obese ([place]) Morbid obesity with BMI of 60.0-69.9, adult (HCC) Weight 235 kg, BMI 60 Lengthy discussion on weight loss   Chronic pain disorder Continue lyrica 200 mg tid   Acquired hypothyroidism Continue synthyroid 75 mcg daily.   Dyslipidemia Continue lipitor 20 mg and zetia 10 mg daily.   Essential hypertension No medication listed on home med rec. Well controlled for the most part, will have patient follow up with PCP for management.   Atrial fibrillation, chronic (HCC) Chronic, rate controlled on amiodarone.  Xarelto for CVA prophylaxis  DVT prophylaxis: xarelto Code Status: Full Family Communication: None  Status is: Inpt  Dispo: The patient is from: Home              Anticipated d/c is to: Home              Anticipated d/c date is: 48-72h              Patient currently NOT medically stable for discharge given ongoing infection requiring IV antibiotics  Consultants:  None  Procedures:  None  Antimicrobials:  ancef   Subjective: Patient reports extension of bullae over right lower lateral leg and foot, patient denies nausea vomiting diarrhea constipation headache fevers chills or chest pain.  Objective: Vitals:   08/07/22 2007 08/08/22 0042 08/08/22 0424 08/08/22 0425  BP: (!) 140/77 120/77  122/83  Pulse: 98 98  98  Resp: 18 18  18  Temp: 97.6 F (36.4 C) 98.1 F (36.7 C)  97.8 F (36.6 C)  TempSrc: Oral Oral  Oral  SpO2:      Weight:   (!) 235.8 kg   Height:        Intake/Output Summary (Last 24 hours) at 08/08/2022 0746 Last data filed at 08/07/2022 1300 Gross per 24 hour  Intake --  Output 1350 ml  Net -1350 ml    Filed Weights   08/06/22 0500 08/07/22 0354 08/08/22 0424  Weight: (!) 235.8 kg (!) 235.8 kg (!) 235.8 kg    Examination:  General: Super  obese gentleman pleasantly resting in bed, No acute distress. HEENT:  Normocephalic atraumatic.  Sclerae nonicteric, noninjected.  Extraocular movements intact bilaterally. Neck:  Without mass or deformity.  Trachea is midline. Lungs:  Clear to auscultate bilaterally without rhonchi, wheeze, or rales. Heart:  Regular rate and rhythm.  Without murmurs, rubs, or gallops. Abdomen:  Soft, nontender, nondistended.  Without guarding or rebound. Skin: Improving anterior leg hematoma, new foot bullae    Data Reviewed: I have personally reviewed following labs and imaging studies  CBC: Recent Labs  Lab 08/04/22 2300 08/06/22 0646 08/08/22 0608  WBC 15.6* 7.1 8.2  HGB 13.9 11.1* 11.3*  HCT 43.8 36.2* 35.8*  MCV 84.7 84.2 84.0  PLT 228 173 [id number]    Basic Metabolic Panel: Recent Labs  Lab 08/04/22 2300 08/05/22 0155 08/06/22 0646 08/08/22 0608  NA 138  --  136 140  K 4.1  --  3.2* 3.8  CL 102  --  100 96*  CO2 20*  --  26 30  GLUCOSE 214*  --  195* 206*  BUN 15  --  7 6  CREATININE 1.44*  --  0.93 1.06  CALCIUM 8.5*  --  8.1* 8.3*  MG  --  1.6*  --   --    GFR: Estimated Creatinine Clearance: 175.9 mL/min (by C-G formula based on SCr of 1.06 mg/dL).  Liver Function Tests: Recent Labs  Lab 08/05/22 0155  AST 29  ALT 17  ALKPHOS 76  BILITOT 0.4  PROT 7.0  ALBUMIN 2.8*   Sepsis Labs: Recent Labs  Lab 08/05/22 0155  LATICACIDVEN 3.4*    Recent Results (from the past 240 hour(s))  Blood culture (routine x 2)     Status: None (Preliminary result)   Collection Time: 08/05/22  1:50 AM   Specimen: BLOOD  Result Value Ref Range Status   Specimen Description BLOOD LEFT ANTECUBITAL  Final   Special Requests   Final    BOTTLES DRAWN AEROBIC AND ANAEROBIC Blood Culture adequate volume   Culture   Final    NO GROWTH 2 DAYS Performed at [facility], [address] [place], [place] [address]    Report Status PENDING  Incomplete  Blood culture (routine x 2)      Status: None (Preliminary result)   Collection Time: 08/05/22  1:50 AM   Specimen: BLOOD  Result Value Ref Range Status   Specimen Description BLOOD LEFT ANTECUBITAL  Final   Special Requests   Final    BOTTLES DRAWN AEROBIC AND ANAEROBIC Blood Culture adequate volume   Culture   Final    NO GROWTH 2 DAYS Performed at [facility], [place] [address], [place], [place] [address]    Report Status PENDING  Incomplete     Radiology Studies: No results found.  Scheduled Meds:  amiodarone  200 mg Oral Daily  atorvastatin  20 mg Oral QPM   diltiazem  240 mg Oral Daily   DULoxetine  20 mg Oral BID   ezetimibe  10 mg Oral Daily   hydrocerin   Topical Daily   influenza vac split quadrivalent PF  0.5 mL Intramuscular Tomorrow-1000   insulin aspart  0-15 Units Subcutaneous TID WC   insulin aspart  0-5 Units Subcutaneous QHS   insulin aspart  10 Units Subcutaneous TID WC   insulin glargine-yfgn  60 Units Subcutaneous BID   levothyroxine  100 mcg Oral Q0600   nortriptyline  75 mg Oral BID   pantoprazole  40 mg Oral Daily   predniSONE  5 mg Oral Daily   pregabalin  200 mg Oral TID   rivaroxaban  20 mg Oral Q supper   saccharomyces boulardii  250 mg Oral BID   tamsulosin  0.4 mg Oral Daily   Continuous Infusions:   ceFAZolin (ANCEF) IV 2 g (08/08/22 0534)    LOS: 3 days   Time spent: 35mn  Elliyah Liszewski C Jimma Ortman, DO [facility]  If 7PM-7AM, please contact night-coverage [email or web address]  08/08/2022, 7:46 AM

## 2022-08-09 LAB — GLUCOSE, CAPILLARY
Glucose-Capillary: 284 mg/dL — ABNORMAL HIGH (ref 70–99)
Glucose-Capillary: 188 mg/dL — ABNORMAL HIGH (ref 70–99)
Glucose-Capillary: 205 mg/dL — ABNORMAL HIGH (ref 70–99)
Glucose-Capillary: 149 mg/dL — ABNORMAL HIGH (ref 70–99)

## 2022-08-09 MED ORDER — POLYETHYLENE GLYCOL 3350 17 G PO PACK
17.0000 g | PACK | Freq: Every day | ORAL | Status: DC | PRN
  Administered 2022-08-09: 17 g via ORAL
  Filled 2022-08-09: qty 1

## 2022-08-09 MED ORDER — SENNOSIDES-DOCUSATE SODIUM 8.6-50 MG PO TABS
1.0000 | ORAL_TABLET | Freq: Two times a day (BID) | ORAL | Status: DC
  Administered 2022-08-09 (×2): 1 via ORAL
  Filled 2022-08-09 (×2): qty 1

## 2022-08-09 NOTE — Plan of Care (Signed)
  Problem: Clinical Measurements: Goal: Ability to avoid or minimize complications of infection will improve Outcome: Not Progressing   Problem: Skin Integrity: Goal: Skin integrity will improve Outcome: Not Progressing   Problem: Education: Goal: Ability to describe self-care measures that may prevent or decrease complications (Diabetes Survival Skills Education) will improve Outcome: Not Progressing Goal: Individualized Educational Video(s) Outcome: Not Progressing   Problem: Coping: Goal: Ability to adjust to condition or change in health will improve Outcome: Not Progressing   Problem: Fluid Volume: Goal: Ability to maintain a balanced intake and output will improve Outcome: Not Progressing   Problem: Health Behavior/Discharge Planning: Goal: Ability to identify and utilize available resources and services will improve Outcome: Not Progressing Goal: Ability to manage health-related needs will improve Outcome: Not Progressing   Problem: Metabolic: Goal: Ability to maintain appropriate glucose levels will improve Outcome: Not Progressing   Problem: Nutritional: Goal: Maintenance of adequate nutrition will improve Outcome: Not Progressing Goal: Progress toward achieving an optimal weight will improve Outcome: Not Progressing   Problem: Skin Integrity: Goal: Risk for impaired skin integrity will decrease Outcome: Not Progressing   Problem: Tissue Perfusion: Goal: Adequacy of tissue perfusion will improve Outcome: Not Progressing   Problem: Education: Goal: Knowledge of General Education information will improve Description: Including pain rating scale, medication(s)/side effects and non-pharmacologic comfort measures Outcome: Not Progressing   Problem: Health Behavior/Discharge Planning: Goal: Ability to manage health-related needs will improve Outcome: Not Progressing   Problem: Clinical Measurements: Goal: Ability to maintain clinical measurements within  normal limits will improve Outcome: Not Progressing Goal: Will remain free from infection Outcome: Not Progressing Goal: Diagnostic test results will improve Outcome: Not Progressing Goal: Respiratory complications will improve Outcome: Not Progressing Goal: Cardiovascular complication will be avoided Outcome: Not Progressing   Problem: Activity: Goal: Risk for activity intolerance will decrease Outcome: Not Progressing   Problem: Nutrition: Goal: Adequate nutrition will be maintained Outcome: Not Progressing   Problem: Coping: Goal: Level of anxiety will decrease Outcome: Not Progressing   Problem: Elimination: Goal: Will not experience complications related to bowel motility Outcome: Not Progressing Goal: Will not experience complications related to urinary retention Outcome: Not Progressing   Problem: Pain Managment: Goal: General experience of comfort will improve Outcome: Not Progressing   Problem: Safety: Goal: Ability to remain free from injury will improve Outcome: Not Progressing   Problem: Skin Integrity: Goal: Risk for impaired skin integrity will decrease Outcome: Not Progressing   

## 2022-08-09 NOTE — Progress Notes (Signed)
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 He states that he was going to the bathroom. He states that he fell onto his right lower leg. This hit the floor. He states that top of his right knee hit the baseboard electric heater. He required assistance to get up as he weighs close to 500 pounds. He still lives at home with his parents who assist with ADLs and wound care.   Assessment & Plan:   Principal Problem:   Cellulitis of right leg Active Problems:   Atrial fibrillation, chronic (HCC)   Essential hypertension   Dyslipidemia   Acquired hypothyroidism   Chronic pain disorder   Morbid obesity with BMI of 60.0-69.9, adult ([place])   Super-super obese (HCC)   DM2 (diabetes mellitus, type 2) (HCC)   Adrenal insufficiency (HCC) - secondary   Closed fracture of distal fibula - right  Cellulitis of right leg -IV Abx with Ancef -CT confirms subcutaneous hematomas, multiple nondisplaced fractures (see below) -Continues to have large bullae surrounding infection, IV Lasix x 1 dose 9th to assist with volume status -Ongoing elevation of leg today.   Closed fracture of distal fibula/5th metatarsal - right Secondary to fall, nonoperative, continue boot with ambulation -Patient indicates he is not walking with the boot we discussed this markedly increases his risk of exacerbating his known fracture.  Adrenal insufficiency (HCC) - secondary Continue with prednisone 5 mg daily.  If he becomes hypotensive, give IV solumedrol 60 mg or IV solucortef   DM2 (diabetes mellitus,  type 2) (HCC) uncontrolled with hyperglycemia Likely exacerbated by dietary noncompliance Continue to titrate insulin as appropriate -sliding scale and 10u premeal ongoing, increase to 60 unit twice daily glargine Lab Results  Component Value Date   HGBA1C 8.0 (H) 08/04/2022   Super obese ([place]) Morbid obesity with BMI of 60.0-69.9, adult (HCC) Weight 235 kg, BMI 60 Lengthy discussion on weight loss   Chronic pain disorder Continue lyrica 200 mg tid   Acquired hypothyroidism Continue synthyroid 75 mcg daily.   Dyslipidemia Continue lipitor 20 mg and zetia 10 mg daily.   Essential hypertension No medication listed on home med rec. Well controlled for the most part, will have patient follow up with PCP for management.   Atrial fibrillation, chronic (HCC) Chronic, rate controlled on amiodarone.  Xarelto for CVA prophylaxis  DVT prophylaxis: xarelto Code Status: Full Family Communication: None  Status is: Inpt  Dispo: The patient is from: Home              Anticipated d/c is to: Home              Anticipated d/c date is: 24-48h              Patient currently NOT medically stable for discharge given ongoing infection requiring IV antibiotics  Consultants:  None  Procedures:  None  Antimicrobials:  ancef   Subjective: No acute issues/events overnight - denies nausea vomiting headache fevers chills diarrhea - indicates some decreased bowel motility but no overt constipation/abdominal distention or pain.  Objective: Vitals:   08/08/22  [id number] 08/08/22 1140 08/08/22 1553 08/08/22 1948  BP: (!) 148/73 137/83 (!) 147/86 (!) 138/90  Pulse:    85  Resp:    18  Temp: 98.1 F (36.7 C) 97.8 F (36.6 C) 98.1 F (36.7 C) 98.7 F (37.1 C)  TempSrc: Oral Oral Oral Oral  SpO2:    95%  Weight:      Height:        Intake/Output Summary (Last 24 hours) at 08/09/2022 0754 Last data filed at 08/09/2022 0700 Gross per 24 hour  Intake 1600 ml  Output 1600 ml  Net 0 ml     Filed Weights   08/06/22 0500 08/07/22 0354 08/08/22 0424  Weight: (!) 235.8 kg (!) 235.8 kg (!) 235.8 kg    Examination:  General: Super obese gentleman pleasantly resting in bed, No acute distress. HEENT:  Normocephalic atraumatic.  Sclerae nonicteric, noninjected.  Extraocular movements intact bilaterally. Neck:  Without mass or deformity.  Trachea is midline. Lungs:  Clear to auscultate bilaterally without rhonchi, wheeze, or rales. Heart:  Regular rate and rhythm.  Without murmurs, rubs, or gallops. Abdomen:  Soft, nontender, nondistended.  Without guarding or rebound. Skin: Improving anterior leg hematoma, new foot bullae    Data Reviewed: I have personally reviewed following labs and imaging studies  CBC: Recent Labs  Lab 08/04/22 2300 08/06/22 0646 08/08/22 0608  WBC 15.6* 7.1 8.2  HGB 13.9 11.1* 11.3*  HCT 43.8 36.2* 35.8*  MCV 84.7 84.2 84.0  PLT 228 173 [id number]    Basic Metabolic Panel: Recent Labs  Lab 08/04/22 2300 08/05/22 0155 08/06/22 0646 08/08/22 0608  NA 138  --  136 140  K 4.1  --  3.2* 3.8  CL 102  --  100 96*  CO2 20*  --  26 30  GLUCOSE 214*  --  195* 206*  BUN 15  --  7 6  CREATININE 1.44*  --  0.93 1.06  CALCIUM 8.5*  --  8.1* 8.3*  MG  --  1.6*  --   --    GFR: Estimated Creatinine Clearance: 175.9 mL/min (by C-G formula based on SCr of 1.06 mg/dL).  Liver Function Tests: Recent Labs  Lab 08/05/22 0155  AST 29  ALT 17  ALKPHOS 76  BILITOT 0.4  PROT 7.0  ALBUMIN 2.8*   Sepsis Labs: Recent Labs  Lab 08/05/22 0155  LATICACIDVEN 3.4*    Recent Results (from the past 240 hour(s))  Blood culture (routine x 2)     Status: None (Preliminary result)   Collection Time: 08/05/22  1:50 AM   Specimen: BLOOD  Result Value Ref Range Status   Specimen Description BLOOD LEFT ANTECUBITAL  Final   Special Requests   Final    BOTTLES DRAWN AEROBIC AND ANAEROBIC Blood Culture adequate volume   Culture   Final    NO GROWTH 3  DAYS Performed at [facility], [address] [place], [place] [address]    Report Status PENDING  Incomplete  Blood culture (routine x 2)     Status: None (Preliminary result)   Collection Time: 08/05/22  1:50 AM   Specimen: BLOOD  Result Value Ref Range Status   Specimen Description BLOOD LEFT ANTECUBITAL  Final   Special Requests   Final    BOTTLES DRAWN AEROBIC AND ANAEROBIC Blood Culture adequate volume   Culture   Final    NO GROWTH 3 DAYS Performed at [facility], [place],  [place] [address]    Report Status PENDING  Incomplete     Radiology Studies: No results found.  Scheduled Meds:  amiodarone  200 mg Oral Daily   atorvastatin  20 mg Oral QPM   diltiazem  240 mg Oral Daily   DULoxetine  20 mg Oral BID   ezetimibe  10 mg Oral Daily   hydrocerin   Topical Daily   influenza vac split quadrivalent PF  0.5 mL Intramuscular Tomorrow-1000   insulin aspart  0-15 Units Subcutaneous TID WC   insulin aspart  0-5 Units Subcutaneous QHS   insulin aspart  15 Units Subcutaneous TID WC   insulin glargine-yfgn  60 Units Subcutaneous BID   levothyroxine  100 mcg Oral Q0600   nortriptyline  75 mg Oral BID   pantoprazole  40 mg Oral Daily   predniSONE  5 mg Oral Daily   pregabalin  200 mg Oral TID   rivaroxaban  20 mg Oral Q supper   saccharomyces boulardii  250 mg Oral BID   tamsulosin  0.4 mg Oral Daily   Continuous Infusions:   ceFAZolin (ANCEF) IV 2 g (08/09/22 0547)    LOS: 4 days   Time spent: 23mn  Emaad Nanna C Tijana Walder, DO [facility]  If 7PM-7AM, please contact night-coverage [email or web address]  08/09/2022, 7:54 AM

## 2022-08-10 ENCOUNTER — Other Ambulatory Visit: Payer: Self-pay | Admitting: Nurse Practitioner

## 2022-08-10 LAB — CBC
HCT: 36.5 % — ABNORMAL LOW (ref 39.0–52.0)
Hemoglobin: 11.5 g/dL — ABNORMAL LOW (ref 13.0–17.0)
MCH: 26.3 pg (ref 26.0–34.0)
MCHC: 31.5 g/dL (ref 30.0–36.0)
MCV: 83.3 fL (ref 80.0–100.0)
Platelets: 218 10*3/uL (ref 150–400)
RBC: 4.38 MIL/uL (ref 4.22–5.81)
RDW: 17.2 % — ABNORMAL HIGH (ref 11.5–15.5)
WBC: 9.6 10*3/uL (ref 4.0–10.5)
nRBC: 1 % — ABNORMAL HIGH (ref 0.0–0.2)

## 2022-08-10 LAB — CULTURE, BLOOD (ROUTINE X 2)
Culture: NO GROWTH
Culture: NO GROWTH
Special Requests: ADEQUATE
Special Requests: ADEQUATE

## 2022-08-10 LAB — BASIC METABOLIC PANEL
Anion gap: 11 (ref 5–15)
BUN: 10 mg/dL (ref 6–20)
CO2: 26 mmol/L (ref 22–32)
Calcium: 8.4 mg/dL — ABNORMAL LOW (ref 8.9–10.3)
Chloride: 101 mmol/L (ref 98–111)
Creatinine, Ser: 1.01 mg/dL (ref 0.61–1.24)
GFR, Estimated: >60 mL/min (ref >60)
Glucose, Bld: 174 mg/dL — ABNORMAL HIGH (ref 70–99)
Potassium: 4.5 mmol/L (ref 3.5–5.1)
Sodium: 138 mmol/L (ref 135–145)

## 2022-08-10 LAB — GLUCOSE, CAPILLARY
Glucose-Capillary: 221 mg/dL — ABNORMAL HIGH (ref 70–99)
Glucose-Capillary: 199 mg/dL — ABNORMAL HIGH (ref 70–99)
Glucose-Capillary: 198 mg/dL — ABNORMAL HIGH (ref 70–99)
Glucose-Capillary: 178 mg/dL — ABNORMAL HIGH (ref 70–99)

## 2022-08-10 MED ORDER — TAMSULOSIN HCL 0.4 MG PO CAPS: 0.4000 mg | ORAL_CAPSULE | Freq: Every day | ORAL | 0 refills | Status: DC

## 2022-08-10 MED ORDER — TRESIBA FLEXTOUCH 200 UNIT/ML ~~LOC~~ SOPN: 60.0000 [IU] | PEN_INJECTOR | Freq: Two times a day (BID) | SUBCUTANEOUS | 0 refills | Status: DC

## 2022-08-10 MED ORDER — CEPHALEXIN 500 MG PO CAPS: 500.0000 mg | ORAL_CAPSULE | Freq: Four times a day (QID) | ORAL | 0 refills | Status: AC

## 2022-08-10 MED ORDER — TRESIBA FLEXTOUCH 200 UNIT/ML ~~LOC~~ SOPN: 60.0000 [IU] | PEN_INJECTOR | Freq: Two times a day (BID) | SUBCUTANEOUS | 1 refills | Status: DC

## 2022-08-10 NOTE — Plan of Care (Signed)

## 2022-08-10 NOTE — TOC Transition Note (Signed)
Transition of Care [facility]) - CM/SW Discharge Note   Patient Details  Name: Carl Hill MRN: [id number] Date of Birth: November 05, 1970  [facility] CM/SW Contact:  Levonne Lapping, RN Phone Number: 08/10/2022, 9:43 AM   Clinical Narrative:     Patient to dc to home  No TOC needs       Barriers to Discharge: Continued Medical Work up   Patient Goals and CMS Choice      Discharge Placement                         Discharge Plan and Services Additional resources added to the After Visit Summary for                                       Social Determinants of Health (SDOH) Interventions SDOH Screenings   Food Insecurity: No Food Insecurity (08/05/2022)  Housing: [place]  (08/05/2022)  Transportation Needs: Unmet Transportation Needs (08/06/2022)  Utilities: Not At Risk (08/05/2022)  Depression (PHQ2-9): Low Risk  (10/17/2020)  Tobacco Use: High Risk (08/05/2022)     Readmission Risk Interventions    07/26/2020    1:23 PM  Readmission Risk Prevention Plan  Transportation Screening Complete  PCP or Specialist Appt within 3-5 Days Complete  HRI or [place] Complete  Social Work Consult for [place] Planning/Counseling Complete  Palliative Care Screening Complete  Medication Review [employer]) Complete

## 2022-08-10 NOTE — Care Management Important Message (Signed)
Important Message  Patient Details  Name: Carl Hill MRN: [id number] Date of Birth: Aug 01, 1970   Medicare Important Message Given:  Yes     Khai Torbert [address] 08/10/2022, 3:38 PM

## 2022-08-10 NOTE — Progress Notes (Signed)
Pharmacy informed via fax that unable to break up a box to dispense 55m so new script sent for 1 box or 15 ml

## 2022-08-10 NOTE — Discharge Summary (Signed)
Physician Discharge Summary  Carl Hill [id number] DOB: 1970-08-11 DOA: 08/04/2022  PCP: Lilian Coma., MD  Admit date: 08/04/2022 Discharge date: 08/10/2022  Admitted From: Home Disposition: Home  Recommendations for Outpatient Follow-up:  Follow up with PCP, wound care this Thursday as previously scheduled Follow-up with orthopedic surgery for recommendations given right lower extremity fractures, continue to wear boot with ambulation  Home Health: None Equipment/Devices: None  Discharge Condition: Stable CODE STATUS: Full Diet recommendation: Low-salt low-fat low-carb diet  Brief/Interim Summary: 52 year old white male with a history of type 2 diabetes on insulin, super morbid obesity with a BMI of 60, weight of 235 kg, history of Fournier's gangrene, chronic A-fib on Xarelto, hypothyroidism, chronic pain disorder, 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 He states that he was going to the bathroom. He states that he fell onto his right lower leg. This hit the floor. He states that top of his right knee hit the baseboard electric heater. He required assistance to get up as he weighs close to 500 pounds. He still lives at home with his parents who assist with ADLs and wound care.   Patient admitted as above with fall, mechanical, with associated right lower extremity hematoma, distal fibular fifth metatarsal fracture on the right and concern over cellulitis in the same area.  Patient was monitored closely, cultures ultimately resulted negative, patient's erythema and cellulitis appear to be resolving with cephalosporins, will transition patient to p.o. Keflex for the remainder of his course with close outpatient follow-up by PCP and wound care.  His chronic comorbid conditions remained stable, diabetes is somewhat uncontrolled with A1c of 8.0, increased his home insulin as below.  Discussed more strict  diet as tolerated, patient understands the risks of ongoing noncompliance with diet.  Patient otherwise stable and agreeable for discharge home, continue to follow closely with PCP and wound care given exam findings, continue to wear boot until follow-up with orthopedic surgery as scheduled.   Discharge Diagnoses:  Principal Problem:   Cellulitis of right leg Active Problems:   Atrial fibrillation, chronic (HCC)   Essential hypertension   Dyslipidemia   Acquired hypothyroidism   Chronic pain disorder   Morbid obesity with BMI of 60.0-69.9, adult ([place])   Super-super obese (HCC)   DM2 (diabetes mellitus, type 2) ([place])   Adrenal insufficiency ([place]) - secondary   Closed fracture of distal fibula - right  Cellulitis of right leg -Transition to Keflex, blood cultures remain negative -CT confirms subcutaneous hematomas, multiple nondisplaced fractures (see below) -Patient's erythema and bullae continue to improved slowly, continue to elevate the leg when possible at home   Closed fracture of distal fibula/5th metatarsal - right Secondary to fall, nonoperative, continue boot with ambulation -Patient recommended to wear boot until follow-up with orthopedic surgery as scheduled   Adrenal insufficiency ([place]) - secondary Continue with prednisone 5 mg daily.    DM2 (diabetes mellitus, type 2) ([place]) uncontrolled with hyperglycemia Likely exacerbated by dietary noncompliance A1c 8.0, adjusted insulin appropriately   Super obese ([place]) Morbid obesity with BMI of 60.0-69.9, adult (HCC) Weight 235 kg, BMI 60 Lengthy discussion on weight loss, improve diet and exercise regimen   Chronic pain disorder Continue lyrica 200 mg tid   Acquired hypothyroidism Continue synthyroid 75 mcg daily.   Dyslipidemia Continue lipitor 20 mg and zetia 10 mg daily.   Essential hypertension No medication listed on home med rec. Well controlled for  the most part, will have patient follow up with PCP for  management.   Atrial fibrillation, chronic (HCC) Chronic, rate controlled on amiodarone.  Xarelto for CVA prophylaxis  Discharge Instructions   Allergies as of 08/10/2022       Reactions   Bee Venom Anaphylaxis   Other reaction(s): Unknown Other reaction(s): Unknown Other reaction(s): Unknown   Sglt2 Inhibitors Other (See Comments)   Necrotizing infection of the perineum   Other Hives        Medication List     TAKE these medications    acetaminophen 325 MG tablet Commonly known as: TYLENOL Take 1-2 tablets (325-650 mg total) by mouth every 4 (four) hours as needed for mild pain.   albuterol 108 (90 Base) MCG/ACT inhaler Commonly known as: VENTOLIN HFA Inhale 2 puffs into the lungs every 6 (six) hours as needed for wheezing or shortness of breath.   ALIVE MENS ENERGY PO Take 1 tablet by mouth daily.   amiodarone 200 MG tablet Commonly known as: PACERONE Take 1 tablet (200 mg total) by mouth daily. What changed:  how much to take additional instructions   atorvastatin 20 MG tablet Commonly known as: LIPITOR Take 1 tablet (20 mg total) by mouth every evening.   cephALEXin 500 MG capsule Commonly known as: KEFLEX Take 1 capsule (500 mg total) by mouth 4 (four) times daily for 7 days.   Cholecalciferol 50 MCG (2000 UT) Tabs Take 2,000 Units by mouth at bedtime.   Comfort EZ Pen Needles 32G X 6 MM Misc Generic drug: Insulin Pen Needle 1 application by Does not apply route with breakfast, with lunch, and with evening meal.   diltiazem 240 MG 24 hr capsule Commonly known as: CARDIZEM CD Take 240 mg by mouth daily.   docusate sodium 100 MG capsule Commonly known as: COLACE Take 1 capsule (100 mg total) by mouth daily. What changed:  when to take this additional instructions   DULoxetine 20 MG capsule Commonly known as: CYMBALTA Take 20 mg by mouth 2 (two) times daily. For neuropathy   ezetimibe 10 MG tablet Commonly known as: ZETIA Take 10 mg by  mouth daily.   insulin aspart 100 UNIT/ML FlexPen Commonly known as: NOVOLOG Inject 10 Units into the skin 3 (three) times daily with meals. What changed: how much to take   levothyroxine 100 MCG tablet Commonly known as: SYNTHROID Take 100 mcg by mouth daily.   magnesium oxide 400 (240 Mg) MG tablet Commonly known as: MAG-OX Take 400 mg by mouth every evening.   melatonin 3 MG Tabs tablet Take 1 tablet (3 mg total) by mouth at bedtime as needed.   nortriptyline 75 MG capsule Commonly known as: PAMELOR Take 75 mg by mouth 2 (two) times daily. For neuropathy   nystatin powder Commonly known as: MYCOSTATIN/NYSTOP Apply 1 Application topically See admin instructions. Apply under stomach and between legs   OMEGA 3-6-9 PO Take 1 capsule by mouth daily.   omeprazole 20 MG capsule Commonly known as: PRILOSEC Take 20 mg by mouth 2 (two) times daily.   polyethylene glycol 17 g packet Commonly known as: MIRALAX / GLYCOLAX Take 17 g by mouth daily. What changed:  when to take this reasons to take this   predniSONE 5 MG tablet Commonly known as: DELTASONE Take 2.5-5 mg by mouth See admin instructions. Take 1 tablet by mouth in the morning and 1/2 tablet in the evening   pregabalin 200 MG capsule Commonly known as: LYRICA Take  200 mg by mouth 3 (three) times daily. What changed: Another medication with the same name was removed. Continue taking this medication, and follow the directions you see here.   rivaroxaban 20 MG Tabs tablet Commonly known as: XARELTO Take 1 tablet (20 mg total) by mouth daily with supper. What changed: when to take this   saccharomyces boulardii 250 MG capsule Commonly known as: FLORASTOR Take 1 capsule (250 mg total) by mouth 2 (two) times daily.   tamsulosin 0.4 MG Caps capsule Commonly known as: FLOMAX Take 0.4 mg by mouth daily.   Tyler Aas FlexTouch 200 UNIT/ML FlexTouch Pen Generic drug: insulin degludec Inject 60 Units into the skin in  the morning and at bedtime. What changed:  how much to take when to take this   TURMERIC PO Take 1 capsule by mouth daily.   Vitamin A 2400 MCG (8000 UT) Caps Take 2,400 mcg by mouth daily.   zinc gluconate 50 MG tablet Take 50 mg by mouth every evening.        Allergies  Allergen Reactions   Bee Venom Anaphylaxis    Other reaction(s): Unknown Other reaction(s): Unknown Other reaction(s): Unknown    Sglt2 Inhibitors Other (See Comments)    Necrotizing infection of the perineum   Other Hives    Consultations: None  Procedures/Studies: CT FOOT RIGHT W CONTRAST  Result Date: 08/05/2022 CLINICAL DATA:  Right foot swelling. Diabetic with osteomyelitis suspected. EXAM: CT OF THE LOWER RIGHT EXTREMITY WITH CONTRAST TECHNIQUE: Multidetector CT imaging of the lower right extremity was performed from the distal femoral diametaphysis through the foot according to the standard protocol following intravenous contrast administration. RADIATION DOSE REDUCTION: This exam was performed according to the departmental dose-optimization program which includes automated exposure control, adjustment of the mA and/or kV according to patient size and/or use of iterative reconstruction technique. CONTRAST:  144m OMNIPAQUE IOHEXOL 350 MG/ML SOLN COMPARISON:  Right ankle and foot films from yesterday. FINDINGS: Bones/Joint/Cartilage There is diffuse osteopenia. Acute or least recent transverse nondisplaced fracture of the lateral malleolar tip is noted, centered anteriorly with a small chip fracture off the posterior aspect of the lateral malleolar tip, the latter distracted a few mm from the parent bone. In the foot, there is a recent nondisplaced transverse fracture extending AP through the medial aspect of the cuboid bone, and a recent nondisplaced oblique fracture of the distal fifth metatarsal shaft which was also noted on the plain radiographs. There are no apparent joint effusions at the knee and  ankle and no further evidence of fractures. No erosive or destructive bone lesion is seen suspicious for acute osteomyelitis. There is tricompartmental degenerative arthrosis of the knee, with joint space loss greatest along the medial femorotibial joint and the lateral patellofemoral joint, with small tricompartmental marginal spurs. At the ankle, there is narrowing along the medial tibiotalar joint most likely due to cartilage loss, and mild midfoot arthrosis as well as hammertoe deformities of the second through fifth toes and mild nonerosive degenerative arthrosis of the forefoot. Ligaments Suboptimally assessed by CT. Muscles and Tendons There is moderate partial fatty atrophy in the distal thigh musculature and superior foreleg musculature and advanced marked fatty atrophy of the mid to distal foreleg muscles and intrinsic foot muscles. Area tendons are not well seen with this technique but grossly intact, as visualized. Soft tissues Subcutaneously in the anterior upper foreleg there is high density fluid measuring 77 Hounsfield units collecting subcutaneously most likely representing a hematoma measuring 13.4 cm transverse, 1.7  cm AP, and 8.9 cm craniocaudal. There is diffuse edema in the ankle and foot, at the ankle is greatest laterally and over the foot is greatest superiorly. There is additional dense fluid collecting subcutaneously at the lateral ankle and also is most likely due to subcutaneous hematoma, estimated to measure 7.9 cm AP, 13.1 cm coronal and 6.6 cm craniocaudal. There are scattered phleboliths in the anteromedial soft tissues of the foreleg and laterally at the level of the ankle. Also noted is a raised skin lesion of indeterminate etiology in the anterior aspect of the distal foreleg, measuring 5.7 cm craniocaudal, 4.4 cm transversely and 0.6 cm height, and could be a blister but is nonspecific. There is no evidence of an abscess, soft tissue gas, or soft tissue mass. IMPRESSION: 1.  Osteopenia and degenerative changes. 2. No erosive or destructive bone lesion is seen suspicious for acute osteomyelitis. 3. Acute or least recent transverse nondisplaced fracture of the lateral malleolar tip with a small chip fracture off the posterior aspect of the lateral malleolar tip. 4. Recent nondisplaced transverse fracture extending AP through the medial aspect of the cuboid bone, and recent nondisplaced oblique fracture of the distal fifth metatarsal shaft. 5. Subcutaneous hematomas in the anterior upper foreleg and lateral ankle. 6. Diffuse edema in the ankle and foot. 7. 5.7 x 4.4 x 0.6 cm raised skin lesion in the anterior aspect of the distal foreleg, nonspecific. 8. Marked fatty atrophy of the mid to distal foreleg muscles and intrinsic foot muscles. Electronically Signed   By: Telford Nab M.D.   On: 08/05/2022 07:11   CT TIBIA FIBULA RIGHT W CONTRAST  Result Date: 08/05/2022 CLINICAL DATA:  Right foot swelling. Diabetic with osteomyelitis suspected. EXAM: CT OF THE LOWER RIGHT EXTREMITY WITH CONTRAST TECHNIQUE: Multidetector CT imaging of the lower right extremity was performed from the distal femoral diametaphysis through the foot according to the standard protocol following intravenous contrast administration. RADIATION DOSE REDUCTION: This exam was performed according to the departmental dose-optimization program which includes automated exposure control, adjustment of the mA and/or kV according to patient size and/or use of iterative reconstruction technique. CONTRAST:  175m OMNIPAQUE IOHEXOL 350 MG/ML SOLN COMPARISON:  Right ankle and foot films from yesterday. FINDINGS: Bones/Joint/Cartilage There is diffuse osteopenia. Acute or least recent transverse nondisplaced fracture of the lateral malleolar tip is noted, centered anteriorly with a small chip fracture off the posterior aspect of the lateral malleolar tip, the latter distracted a few mm from the parent bone. In the foot, there is a  recent nondisplaced transverse fracture extending AP through the medial aspect of the cuboid bone, and a recent nondisplaced oblique fracture of the distal fifth metatarsal shaft which was also noted on the plain radiographs. There are no apparent joint effusions at the knee and ankle and no further evidence of fractures. No erosive or destructive bone lesion is seen suspicious for acute osteomyelitis. There is tricompartmental degenerative arthrosis of the knee, with joint space loss greatest along the medial femorotibial joint and the lateral patellofemoral joint, with small tricompartmental marginal spurs. At the ankle, there is narrowing along the medial tibiotalar joint most likely due to cartilage loss, and mild midfoot arthrosis as well as hammertoe deformities of the second through fifth toes and mild nonerosive degenerative arthrosis of the forefoot. Ligaments Suboptimally assessed by CT. Muscles and Tendons There is moderate partial fatty atrophy in the distal thigh musculature and superior foreleg musculature and advanced marked fatty atrophy of the mid to distal foreleg  muscles and intrinsic foot muscles. Area tendons are not well seen with this technique but grossly intact, as visualized. Soft tissues Subcutaneously in the anterior upper foreleg there is high density fluid measuring 77 Hounsfield units collecting subcutaneously most likely representing a hematoma measuring 13.4 cm transverse, 1.7 cm AP, and 8.9 cm craniocaudal. There is diffuse edema in the ankle and foot, at the ankle is greatest laterally and over the foot is greatest superiorly. There is additional dense fluid collecting subcutaneously at the lateral ankle and also is most likely due to subcutaneous hematoma, estimated to measure 7.9 cm AP, 13.1 cm coronal and 6.6 cm craniocaudal. There are scattered phleboliths in the anteromedial soft tissues of the foreleg and laterally at the level of the ankle. Also noted is a raised skin lesion  of indeterminate etiology in the anterior aspect of the distal foreleg, measuring 5.7 cm craniocaudal, 4.4 cm transversely and 0.6 cm height, and could be a blister but is nonspecific. There is no evidence of an abscess, soft tissue gas, or soft tissue mass. IMPRESSION: 1. Osteopenia and degenerative changes. 2. No erosive or destructive bone lesion is seen suspicious for acute osteomyelitis. 3. Acute or least recent transverse nondisplaced fracture of the lateral malleolar tip with a small chip fracture off the posterior aspect of the lateral malleolar tip. 4. Recent nondisplaced transverse fracture extending AP through the medial aspect of the cuboid bone, and recent nondisplaced oblique fracture of the distal fifth metatarsal shaft. 5. Subcutaneous hematomas in the anterior upper foreleg and lateral ankle. 6. Diffuse edema in the ankle and foot. 7. 5.7 x 4.4 x 0.6 cm raised skin lesion in the anterior aspect of the distal foreleg, nonspecific. 8. Marked fatty atrophy of the mid to distal foreleg muscles and intrinsic foot muscles. Electronically Signed   By: Telford Nab M.D.   On: 08/05/2022 07:11   CT Head Wo Contrast  Result Date: 08/05/2022 CLINICAL DATA:  Fall. EXAM: CT HEAD WITHOUT CONTRAST TECHNIQUE: Contiguous axial images were obtained from the base of the skull through the vertex without intravenous contrast. RADIATION DOSE REDUCTION: This exam was performed according to the departmental dose-optimization program which includes automated exposure control, adjustment of the mA and/or kV according to patient size and/or use of iterative reconstruction technique. COMPARISON:  11/05/2020 FINDINGS: Brain: No acute intracranial abnormality. Specifically, no hemorrhage, hydrocephalus, mass lesion, acute infarction, or significant intracranial injury. Vascular: No hyperdense vessel or unexpected calcification. Skull: No acute calvarial abnormality. Sinuses/Orbits: No acute findings Other: None IMPRESSION:  No acute intracranial abnormality. Electronically Signed   By: Rolm Baptise M.D.   On: 08/05/2022 01:54   DG Chest Portable 1 View  Result Date: 08/04/2022 CLINICAL DATA:  Fall. EXAM: PORTABLE CHEST 1 VIEW COMPARISON:  12/10/2020 FINDINGS: Stable heart size and mediastinal contours. Suspected right infrahilar scarring. No acute airspace disease. No significant pleural effusion. No visualized pneumothorax. Soft tissue attenuation from habitus limits detailed assessment. IMPRESSION: No acute findings. Suspected right infrahilar scarring. Electronically Signed   By: Keith Rake M.D.   On: 08/04/2022 23:44   DG Ankle Complete Right  Result Date: 08/04/2022 CLINICAL DATA:  Fall. EXAM: RIGHT ANKLE - COMPLETE 3+ VIEW COMPARISON:  None Available. FINDINGS: Transverse nondisplaced fracture through the distal aspect of the distal fibula. This is distal to the ankle mortise. No mortise widening. No additional fracture of the ankle. No definite ankle joint effusion. There is generalized soft tissue edema which is more prominent laterally. Soft tissue phleboliths. IMPRESSION: Nondisplaced transverse  fracture through the distal aspect of the distal fibula. Adjacent soft tissue edema. Electronically Signed   By: Keith Rake M.D.   On: 08/04/2022 23:42   DG Tibia/Fibula Right  Result Date: 08/04/2022 CLINICAL DATA:  Fall. EXAM: RIGHT TIBIA AND FIBULA - 2 VIEW COMPARISON:  None Available. FINDINGS: There is no evidence of fracture or other focal bone lesions. The cortical margins of the tibia and fibula are intact. Multiple phleboliths in the soft tissues. Additional sheet like calcifications anteriorly over the mid lower leg. Soft tissue prominence and edema overlies the anterior upper aspect of the lower leg. IMPRESSION: 1. Soft tissue prominence and edema over the anterior upper aspect of the lower leg. No acute osseous abnormality. 2. Soft tissue phleboliths with additional sheet like calcifications anteriorly  over the mid lower leg. Electronically Signed   By: Keith Rake M.D.   On: 08/04/2022 23:41   DG Knee 2 Views Right  Result Date: 08/04/2022 CLINICAL DATA:  Fall. EXAM: RIGHT KNEE - 1-2 VIEW COMPARISON:  Radiograph 09/25/2020 FINDINGS: No fracture or dislocation. Mild medial tibiofemoral joint space narrowing. Tricompartmental peripheral spurring. No significant knee joint effusion. Soft tissue edema versus habitus. IMPRESSION: 1. No fracture or dislocation. 2. Mild tricompartmental osteoarthritis. Electronically Signed   By: Keith Rake M.D.   On: 08/04/2022 23:39   DG Foot Complete Right  Result Date: 08/04/2022 CLINICAL DATA:  Recent fall with right foot pain, initial encounter EXAM: RIGHT FOOT COMPLETE - 3+ VIEW COMPARISON:  None Available. FINDINGS: Curvilinear lucency is noted in the midportion of the fifth metatarsal consistent with undisplaced fracture. No other fracture is seen. No soft tissue abnormality is noted. Loss of the first distal phalangeal tuft is noted felt to be of a chronic nature. IMPRESSION: Undisplaced fifth metatarsal fracture Electronically Signed   By: Inez Catalina M.D.   On: 08/04/2022 23:38     Subjective: No acute issues or events overnight   Discharge Exam: Vitals:   08/10/22 0530 08/10/22 0744  BP: 129/88 132/85  Pulse:  98  Resp:  17  Temp: 97.9 F (36.6 C) 98 F (36.7 C)  SpO2: 95%    Vitals:   08/09/22 2021 08/09/22 2031 08/10/22 0530 08/10/22 0744  BP: (!) 166/85 (!) 153/84 129/88 132/85  Pulse: 100   98  Resp: 16   17  Temp: 97.8 F (36.6 C)  97.9 F (36.6 C) 98 F (36.7 C)  TempSrc: Oral  Oral Oral  SpO2: 96%  95%   Weight:      Height:        General: Pt is alert, awake, not in acute distress Cardiovascular: RRR, S1/S2 +, no rubs, no gallops Respiratory: CTA bilaterally, no wheezing, no rhonchi Abdominal: Soft, NT, ND, bowel sounds + Extremities: Right lower extremity bandage clean dry intact, boot in place, excoriation over  right anterolateral knee 2 x 2 cm well-healing    The results of significant diagnostics from this hospitalization (including imaging, microbiology, ancillary and laboratory) are listed below for reference.     Microbiology: Recent Results (from the past 240 hour(s))  Blood culture (routine x 2)     Status: None (Preliminary result)   Collection Time: 08/05/22  1:50 AM   Specimen: BLOOD  Result Value Ref Range Status   Specimen Description BLOOD LEFT ANTECUBITAL  Final   Special Requests   Final    BOTTLES DRAWN AEROBIC AND ANAEROBIC Blood Culture adequate volume   Culture   Final  NO GROWTH 4 DAYS Performed at [facility], [place] [address], [place], [place] [address]    Report Status PENDING  Incomplete  Blood culture (routine x 2)     Status: None (Preliminary result)   Collection Time: 08/05/22  1:50 AM   Specimen: BLOOD  Result Value Ref Range Status   Specimen Description BLOOD LEFT ANTECUBITAL  Final   Special Requests   Final    BOTTLES DRAWN AEROBIC AND ANAEROBIC Blood Culture adequate volume   Culture   Final    NO GROWTH 4 DAYS Performed at [facility], [place] [address] [place], [place] [address]    Report Status PENDING  Incomplete     Labs: BNP (last 3 results) Recent Labs    08/05/22 0155  BNP [id number]   Basic Metabolic Panel: Recent Labs  Lab 08/04/22 2300 08/05/22 0155 08/06/22 0646 08/08/22 0608 08/10/22 0630  NA 138  --  136 140 138  K 4.1  --  3.2* 3.8 4.5  CL 102  --  100 96* 101  CO2 20*  --  '26 30 26  '$ GLUCOSE 214*  --  195* 206* 174*  BUN 15  --  '7 6 10  '$ CREATININE 1.44*  --  0.93 1.06 1.01  CALCIUM 8.5*  --  8.1* 8.3* 8.4*  MG  --  1.6*  --   --   --    Liver Function Tests: Recent Labs  Lab 08/05/22 0155  AST 29  ALT 17  ALKPHOS 76  BILITOT 0.4  PROT 7.0  ALBUMIN 2.8*   No results for input(s): "LIPASE", "AMYLASE" in the last 168 hours. No results for input(s): "AMMONIA" in the last 168 hours. CBC: Recent Labs   Lab 08/04/22 2300 08/06/22 0646 08/08/22 0608 08/10/22 0630  WBC 15.6* 7.1 8.2 9.6  HGB 13.9 11.1* 11.3* 11.5*  HCT 43.8 36.2* 35.8* 36.5*  MCV 84.7 84.2 84.0 83.3  PLT 228 173 187 218   CBG: Recent Labs  Lab 08/09/22 0803 08/09/22 1155 08/09/22 1615 08/09/22 2110 08/10/22 0747  GLUCAP 284* 188* 205* 149* 198*    Urinalysis    Component Value Date/Time   COLORURINE YELLOW 12/10/2020 2003   APPEARANCEUR CLEAR 12/10/2020 2003   LABSPEC 1.014 12/10/2020 2003   PHURINE 7.0 12/10/2020 2003   GLUCOSEU NEGATIVE 12/10/2020 2003   HGBUR NEGATIVE 12/10/2020 2003   BILIRUBINUR NEGATIVE 12/10/2020 2003   KETONESUR NEGATIVE 12/10/2020 2003   PROTEINUR NEGATIVE 12/10/2020 2003   NITRITE NEGATIVE 12/10/2020 2003   LEUKOCYTESUR NEGATIVE 12/10/2020 2003   Sepsis Labs Recent Labs  Lab 08/04/22 2300 08/06/22 0646 08/08/22 0608 08/10/22 0630  WBC 15.6* 7.1 8.2 9.6   Microbiology Recent Results (from the past 240 hour(s))  Blood culture (routine x 2)     Status: None (Preliminary result)   Collection Time: 08/05/22  1:50 AM   Specimen: BLOOD  Result Value Ref Range Status   Specimen Description BLOOD LEFT ANTECUBITAL  Final   Special Requests   Final    BOTTLES DRAWN AEROBIC AND ANAEROBIC Blood Culture adequate volume   Culture   Final    NO GROWTH 4 DAYS Performed at [facility], [place] [address] [place],  [address]    Report Status PENDING  Incomplete  Blood culture (routine x 2)     Status: None (Preliminary result)   Collection Time: 08/05/22  1:50 AM   Specimen: BLOOD  Result Value Ref Range Status  Specimen Description BLOOD LEFT ANTECUBITAL  Final   Special Requests   Final    BOTTLES DRAWN AEROBIC AND ANAEROBIC Blood Culture adequate volume   Culture   Final    NO GROWTH 4 DAYS Performed at [facility], [address], [place], [place] [address]    Report Status PENDING  Incomplete     Time coordinating discharge: Over 30  minutes  SIGNED:   Little Ishikawa, DO [facility] 08/10/2022, 8:08 AM Pager   If 7PM-7AM, please contact night-coverage [email or web address]

## 2022-08-10 NOTE — Progress Notes (Signed)
Physical Therapy Treatment Patient Details Name: Carl Hill MRN: [id number] DOB: [birth year] Today's Date: 08/10/2022   History of Present Illness Pt is 52 yo male who presents on 08/04/22 after falling at home and having R knee pain. Pt with RLE cellulitis. Xrays showed nondisplaced transverse fx of the distal fibula. 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 Pt functioning at min guard level for safety due to weighing near 500 pounds. Pt was able to dress self in shorts and tshirt with help only to bring shorts over feet. Pt with increased ambulation tolerance this date compared to previous PT session. Acute PT to cont to follow.    Recommendations for follow up therapy are one component of a multi-disciplinary discharge planning process, led by the attending physician.  Recommendations may be updated based on patient status, additional functional criteria and insurance authorization.  Follow Up Recommendations  No PT follow up     Assistance Recommended at Discharge PRN  Patient can return home with the following A little help with walking and/or transfers;A little help with bathing/dressing/bathroom;Assistance with cooking/housework;Help with stairs or ramp for entrance   Equipment Recommendations  None recommended by PT    Recommendations for Other Services       Precautions / Restrictions Precautions Precautions: Fall Required Braces or Orthoses: Other Brace Other Brace: cam boot on RLE for ambulation Restrictions Weight Bearing Restrictions: Yes RLE Weight Bearing: Weight bearing as tolerated (in boot)     Mobility  Bed Mobility Overal bed mobility: Modified Independent             General bed mobility comments: uses  bed rail and momentum    Transfers Overall transfer level: Needs assistance Equipment used: Rolling walker (2 wheels) Transfers: Sit to/from Stand Sit to Stand: Supervision           General transfer comment: supervision for safety, pulls up on arm of bariatric chair in room    Ambulation/Gait Ambulation/Gait assistance: Min guard Gait Distance (Feet): 75 Feet Assistive device: Rolling walker (2 wheels) (bariatric) Gait Pattern/deviations: Step-through pattern, Wide base of support (with bilat LEs in external rotation) Gait velocity: decreased Gait velocity interpretation: <1.31 ft/sec, indicative of household ambulator   General Gait Details: had some difficulty keeping RLE within RW with CAM boot. Flexed trunk as RW does not go tall enough for his 6'6" frame. Pt with noted SOB but able to talk and walk. SpO2 at 92% on RA. Pt reports 3/4 on DOE.   Stairs             Wheelchair Mobility    Modified Rankin (Stroke Patients Only)       Balance Overall balance assessment: History of Falls, Needs assistance Sitting-balance support: No upper extremity supported, Feet supported Sitting balance-Leahy Scale: Good     Standing balance support: Bilateral upper extremity supported, During functional activity Standing balance-Leahy Scale: Fair Standing balance comment: pt with decreased proprioception BLE's resulting in balance deficits at baseline. Pt with R CAM boot and L shoe for ambulation                            Cognition Arousal/Alertness: Awake/alert Behavior During Therapy: WFL for tasks assessed/performed Overall Cognitive Status: Within Functional Limits  for tasks assessed                                 General Comments: pt quite verbose and able to relay his medical history, good knowledge of current limitations        Exercises      General Comments General comments (skin integrity, edema, etc.): SpO2 at 92% on RA, HR at  132bpm during ambulation, healing rug burn on R knee, assisted pt with donning shorts in the bed, pt able to don shirt EOB and pull shorts up in standing      Pertinent Vitals/Pain Pain Assessment Pain Assessment: 0-10 (pt reports neuropathy from knee down, however this is baseline, R knee 5/10) Pain Score: 5     Home Living                          Prior Function            PT Goals (current goals can now be found in the care plan section) Acute Rehab PT Goals Patient Stated Goal: return home PT Goal Formulation: With patient Time For Goal Achievement: 08/20/22 Potential to Achieve Goals: Good Progress towards PT goals: Progressing toward goals    Frequency    Min 3X/week      PT Plan Current plan remains appropriate    Co-evaluation              AM-PAC PT "6 Clicks" Mobility   Outcome Measure  Help needed turning from your back to your side while in a flat bed without using bedrails?: None Help needed moving from lying on your back to sitting on the side of a flat bed without using bedrails?: None Help needed moving to and from a bed to a chair (including a wheelchair)?: A Little Help needed standing up from a chair using your arms (e.g., wheelchair or bedside chair)?: A Little Help needed to walk in hospital room?: A Little Help needed climbing 3-5 steps with a railing? : A Little 6 Click Score: 20    End of Session   Activity Tolerance: Patient tolerated treatment well Patient left: in chair;with call bell/phone within reach (barichair) Nurse Communication: Mobility status PT Visit Diagnosis: Unsteadiness on feet (R26.81);History of falling (Z91.81);Pain;Difficulty in walking, not elsewhere classified (R26.2) Pain - Right/Left: Right Pain - part of body: Leg     Time: [id number] PT Time Calculation (min) (ACUTE ONLY): 30 min  Charges:  $Gait Training: 8-22 mins $Therapeutic Activity: 8-22 mins                     Kittie Plater, PT,  DPT Acute Rehabilitation Services Secure chat preferred Office #: [phone]    Berline Lopes 08/10/2022, 12:44 PM

## 2022-08-10 NOTE — Progress Notes (Signed)
Patient right leg dressing changed at 1200 to include 6 pieces xeroform; 3 abd pads; 2 rolls of kerlex and three ace bandages.  Patient tolerated well.

## 2022-09-11 ENCOUNTER — Institutional Professional Consult (permissible substitution) (HOSPITAL_BASED_OUTPATIENT_CLINIC_OR_DEPARTMENT_OTHER): Payer: Medicare Other | Admitting: Pulmonary Disease

## 2022-10-19 ENCOUNTER — Institutional Professional Consult (permissible substitution) (HOSPITAL_BASED_OUTPATIENT_CLINIC_OR_DEPARTMENT_OTHER): Payer: Medicare Other | Admitting: Pulmonary Disease

## 2022-10-28 DIAGNOSIS — D649 Anemia, unspecified: Secondary | ICD-10-CM | POA: Diagnosis present

## 2022-11-24 ENCOUNTER — Institutional Professional Consult (permissible substitution) (HOSPITAL_BASED_OUTPATIENT_CLINIC_OR_DEPARTMENT_OTHER): Payer: Medicare Other | Admitting: Pulmonary Disease

## 2022-12-22 ENCOUNTER — Institutional Professional Consult (permissible substitution) (HOSPITAL_BASED_OUTPATIENT_CLINIC_OR_DEPARTMENT_OTHER): Payer: Medicare Other | Admitting: Pulmonary Disease

## 2023-02-03 ENCOUNTER — Institutional Professional Consult (permissible substitution) (HOSPITAL_BASED_OUTPATIENT_CLINIC_OR_DEPARTMENT_OTHER): Payer: Medicare Other | Admitting: Pulmonary Disease

## 2023-03-18 ENCOUNTER — Institutional Professional Consult (permissible substitution): Payer: Medicare Other | Admitting: Pulmonary Disease

## 2023-05-25 ENCOUNTER — Other Ambulatory Visit: Payer: Self-pay

## 2023-05-25 ENCOUNTER — Encounter (HOSPITAL_COMMUNITY): Payer: Self-pay | Admitting: Emergency Medicine

## 2023-05-25 ENCOUNTER — Emergency Department (HOSPITAL_COMMUNITY)
Admission: EM | Admit: 2023-05-25 | Discharge: 2023-05-26 | Disposition: A | Discharge status: Home / Self Care | Payer: Medicare Other | Attending: Emergency Medicine | Admitting: Emergency Medicine

## 2023-05-25 ENCOUNTER — Emergency Department (HOSPITAL_COMMUNITY): Payer: Medicare Other

## 2023-05-25 DIAGNOSIS — Z7901 Long term (current) use of anticoagulants: Secondary | ICD-10-CM | POA: Insufficient documentation

## 2023-05-25 DIAGNOSIS — Z794 Long term (current) use of insulin: Secondary | ICD-10-CM | POA: Insufficient documentation

## 2023-05-25 DIAGNOSIS — N201 Calculus of ureter: Secondary | ICD-10-CM | POA: Diagnosis not present

## 2023-05-25 DIAGNOSIS — R319 Hematuria, unspecified: Secondary | ICD-10-CM | POA: Diagnosis present

## 2023-05-25 LAB — CBC
HCT: 45.5 % (ref 39.0–52.0)
Hemoglobin: 14.1 g/dL (ref 13.0–17.0)
MCH: 26.7 pg (ref 26.0–34.0)
MCHC: 31 g/dL (ref 30.0–36.0)
MCV: 86.2 fL (ref 80.0–100.0)
Platelets: 223 10*3/uL (ref 150–400)
RBC: 5.28 MIL/uL (ref 4.22–5.81)
RDW: 15.4 % (ref 11.5–15.5)
WBC: 9.4 10*3/uL (ref 4.0–10.5)
nRBC: 0 % (ref 0.0–0.2)

## 2023-05-25 LAB — BASIC METABOLIC PANEL
Anion gap: 11 (ref 5–15)
BUN: 12 mg/dL (ref 6–20)
CO2: 26 mmol/L (ref 22–32)
Calcium: 8.8 mg/dL — ABNORMAL LOW (ref 8.9–10.3)
Chloride: 97 mmol/L — ABNORMAL LOW (ref 98–111)
Creatinine, Ser: 1 mg/dL (ref 0.61–1.24)
GFR, Estimated: >60 mL/min (ref >60)
Glucose, Bld: 197 mg/dL — ABNORMAL HIGH (ref 70–99)
Potassium: 4.3 mmol/L (ref 3.5–5.1)
Sodium: 134 mmol/L — ABNORMAL LOW (ref 135–145)

## 2023-05-25 MED ORDER — IOHEXOL 350 MG/ML SOLN
100.0000 mL | Freq: Once | INTRAVENOUS | Status: AC | PRN
  Administered 2023-05-25: 100 mL via INTRAVENOUS

## 2023-05-25 NOTE — ED Provider Notes (Incomplete)
[place] EMERGENCY DEPARTMENT AT [facility] Provider Note   CSN: [id number] Arrival date & time: 05/25/23  1825     History {Add pertinent medical, surgical, social history, OB history to HPI:1} Chief Complaint  Patient presents with   Hematuria    Carl Hill is a 52 y.o. male.   Hematuria   Pt states he noticed blood in his urine yesterday.  He had another episode today where it looked pink in color.  He has had subsequent episodes where it has been dark in color.  He called his doctor and was told to come to the ED.  No fever.  No pain.  Pt does take xarelto.    Home Medications Prior to Admission medications   Medication Sig Start Date End Date Taking? Authorizing Provider  acetaminophen (TYLENOL) 325 MG tablet Take 1-2 tablets (325-650 mg total) by mouth every 4 (four) hours as needed for mild pain. 09/06/20   Love, Evlyn Kanner, PA-C  albuterol (VENTOLIN HFA) 108 (90 Base) MCG/ACT inhaler Inhale 2 puffs into the lungs every 6 (six) hours as needed for wheezing or shortness of breath.    [provider]  amiodarone (PACERONE) 200 MG tablet Take 1 tablet (200 mg total) by mouth daily. Patient taking differently: Take 100 mg by mouth daily. Do not take if HR is >60 09/12/20   Love, Evlyn Kanner, PA-C  atorvastatin (LIPITOR) 20 MG tablet Take 1 tablet (20 mg total) by mouth every evening. 09/12/20   Love, Evlyn Kanner, PA-C  Cholecalciferol 50 MCG (2000 UT) TABS Take 2,000 Units by mouth at bedtime.    [provider]  diltiazem (CARDIZEM CD) 240 MG 24 hr capsule Take 240 mg by mouth daily. 07/06/22   [provider]  docusate sodium (COLACE) 100 MG capsule Take 1 capsule (100 mg total) by mouth daily. Patient taking differently: Take 100 mg by mouth See admin instructions. Take 1 capsule by mouth every other night 09/12/20   Love, Evlyn Kanner, PA-C  DULoxetine (CYMBALTA) 20 MG capsule Take 20 mg by mouth 2 (two) times daily. For neuropathy    [provider]  ezetimibe (ZETIA) 10 MG tablet Take 10 mg by mouth daily. Patient not taking: Reported on 08/05/2022    [provider]  insulin aspart (NOVOLOG) 100 UNIT/ML FlexPen Inject 10 Units into the skin 3 (three) times daily with meals. Patient taking differently: Inject 20 Units into the skin 3 (three) times daily with meals. 09/12/20   Love, Evlyn Kanner, PA-C  insulin degludec (TRESIBA FLEXTOUCH) 200 UNIT/ML FlexTouch Pen Inject 60 Units into the skin 2 (two) times daily. 08/10/22   Azucena Fallen, MD  Insulin Pen Needle (COMFORT EZ PEN NEEDLES) 32G X 6 MM MISC 1 application by Does not apply route with breakfast, with lunch, and with evening meal. 09/12/20   Love, Evlyn Kanner, PA-C  levothyroxine (SYNTHROID) 100 MCG tablet Take 100 mcg by mouth daily. 04/03/20   [provider]  magnesium oxide (MAG-OX) 400 (240 Mg) MG tablet Take 400 mg by mouth every evening.    [provider]  melatonin 3 MG TABS tablet Take 1 tablet (3 mg total) by mouth at bedtime as needed. 08/01/20   Russella Dar, NP  Multiple Vitamins-Minerals (ALIVE MENS ENERGY PO) Take 1 tablet by mouth daily.    [provider]  nortriptyline (PAMELOR) 75 MG capsule Take 75 mg by mouth 2 (two) times daily. For neuropathy  [provider]  nystatin (MYCOSTATIN/NYSTOP) powder Apply 1 Application topically See admin instructions. Apply under stomach and between legs 04/11/19   [provider]  Omega 3-6-9 Fatty Acids (OMEGA 3-6-9 PO) Take 1 capsule by mouth daily.    [provider]  omeprazole (PRILOSEC) 20 MG capsule Take 20 mg by mouth 2 (two) times daily. 10/07/20   [provider]  polyethylene glycol (MIRALAX / GLYCOLAX) 17 g packet Take 17 g by mouth daily. Patient taking differently: Take 17 g by mouth daily as needed for mild constipation. 09/12/20   Love, Evlyn Kanner, PA-C  predniSONE (DELTASONE) 5 MG tablet Take 2.5-5 mg by mouth See admin instructions.  Take 1 tablet by mouth in the morning and 1/2 tablet in the evening    [provider]  pregabalin (LYRICA) 200 MG capsule Take 200 mg by mouth 3 (three) times daily.    [provider]  rivaroxaban (XARELTO) 20 MG TABS tablet Take 1 tablet (20 mg total) by mouth daily with supper. Patient taking differently: Take 20 mg by mouth in the morning. 09/12/20   Love, Evlyn Kanner, PA-C  saccharomyces boulardii (FLORASTOR) 250 MG capsule Take 1 capsule (250 mg total) by mouth 2 (two) times daily. Patient not taking: Reported on 08/05/2022 08/05/20   Cleora Fleet, MD  tamsulosin (FLOMAX) 0.4 MG CAPS capsule Take 1 capsule (0.4 mg total) by mouth daily. 08/10/22   Azucena Fallen, MD  TURMERIC PO Take 1 capsule by mouth daily.    [provider]  Vitamin A 2400 MCG (8000 UT) CAPS Take 2,400 mcg by mouth daily.    [provider]  zinc gluconate 50 MG tablet Take 50 mg by mouth every evening.    [provider]      Allergies    Bee venom, Sglt2 inhibitors, and Other    Review of Systems   Review of Systems  Genitourinary:  Positive for hematuria.    Physical Exam Updated Vital Signs BP 119/81 (BP Location: Left Arm)   Pulse (!) 126   Temp 98.3 F (36.8 C) (Oral)   Resp 20   Ht 1.981 m (6\' 6" )   Wt (!) 235.8 kg   SpO2 100%   BMI 60.07 kg/m  Physical Exam  ED Results / Procedures / Treatments   Labs (all labs ordered are listed, but only abnormal results are displayed) Labs Reviewed - No data to display  EKG None  Radiology No results found.  Procedures Procedures  {Document cardiac monitor, telemetry assessment procedure when appropriate:1}  Medications Ordered in ED Medications - No data to display  ED Course/ Medical Decision Making/ A&P   {   Click here for ABCD2, HEART and other calculatorsREFRESH Note before signing :1}                              Medical Decision Making  ***  {Document critical care time when  appropriate:1} {Document review of labs and clinical decision tools ie heart score, Chads2Vasc2 etc:1}  {Document your independent review of radiology images, and any outside records:1} {Document your discussion with family members, caretakers, and with consultants:1} {Document social determinants of health affecting pt's care:1} {Document your decision making why or why not admission, treatments were needed:1} Final Clinical Impression(s) / ED Diagnoses Final diagnoses:  None    Rx / DC Orders ED Discharge Orders     None

## 2023-05-25 NOTE — ED Triage Notes (Signed)
Pt BIB EMS from home for blood in urine. Today he has noticed that his urine has been light pink to dark red in color. Denies pain with urination. EMS VS: 150/90 HR 120

## 2023-05-25 NOTE — ED Provider Notes (Signed)
[place] EMERGENCY DEPARTMENT AT [facility] Provider Note   CSN: [id number] Arrival date & time: 05/25/23  1825     History {Add pertinent medical, surgical, social history, OB history to HPI:1} Chief Complaint  Patient presents with   Hematuria    Carl Hill is a 52 y.o. male.   Hematuria     Pt states he noticed blood in his urine yesterday. He had another episode today where it looked pink in color. He has had subsequent episodes where it has been dark in color. He called his doctor and was told to come to the ED. No fever. No pain. Pt does take xarelto.   Home Medications Prior to Admission medications   Medication Sig Start Date End Date Taking? Authorizing Provider  acetaminophen (TYLENOL) 325 MG tablet Take 1-2 tablets (325-650 mg total) by mouth every 4 (four) hours as needed for mild pain. 09/06/20   Love, Evlyn Kanner, PA-C  albuterol (VENTOLIN HFA) 108 (90 Base) MCG/ACT inhaler Inhale 2 puffs into the lungs every 6 (six) hours as needed for wheezing or shortness of breath.    [provider]  amiodarone (PACERONE) 200 MG tablet Take 1 tablet (200 mg total) by mouth daily. Patient taking differently: Take 100 mg by mouth daily. Do not take if HR is >60 09/12/20   Love, Evlyn Kanner, PA-C  atorvastatin (LIPITOR) 20 MG tablet Take 1 tablet (20 mg total) by mouth every evening. 09/12/20   Love, Evlyn Kanner, PA-C  Cholecalciferol 50 MCG (2000 UT) TABS Take 2,000 Units by mouth at bedtime.    [provider]  diltiazem (CARDIZEM CD) 240 MG 24 hr capsule Take 240 mg by mouth daily. 07/06/22   [provider]  docusate sodium (COLACE) 100 MG capsule Take 1 capsule (100 mg total) by mouth daily. Patient taking differently: Take 100 mg by mouth See admin instructions. Take 1 capsule by mouth every other night 09/12/20   Love, Evlyn Kanner, PA-C  DULoxetine (CYMBALTA) 20 MG capsule Take 20 mg by mouth 2 (two) times daily. For neuropathy    [provider]  ezetimibe (ZETIA) 10 MG tablet Take 10 mg by mouth daily. Patient not taking: Reported on 08/05/2022    [provider]  insulin aspart (NOVOLOG) 100 UNIT/ML FlexPen Inject 10 Units into the skin 3 (three) times daily with meals. Patient taking differently: Inject 20 Units into the skin 3 (three) times daily with meals. 09/12/20   Love, Evlyn Kanner, PA-C  insulin degludec (TRESIBA FLEXTOUCH) 200 UNIT/ML FlexTouch Pen Inject 60 Units into the skin 2 (two) times daily. 08/10/22   Azucena Fallen, MD  Insulin Pen Needle (COMFORT EZ PEN NEEDLES) 32G X 6 MM MISC 1 application by Does not apply route with breakfast, with lunch, and with evening meal. 09/12/20   Love, Evlyn Kanner, PA-C  levothyroxine (SYNTHROID) 100 MCG tablet Take 100 mcg by mouth daily. 04/03/20   [provider]  magnesium oxide (MAG-OX) 400 (240 Mg) MG tablet Take 400 mg by mouth every evening.    [provider]  melatonin 3 MG TABS tablet Take 1 tablet (3 mg total) by mouth at bedtime as needed. 08/01/20   Russella Dar, NP  Multiple Vitamins-Minerals (ALIVE MENS ENERGY PO) Take 1 tablet by mouth daily.    [provider]  nortriptyline (PAMELOR) 75 MG capsule Take 75 mg by mouth 2 (two) times daily. For neuropathy    [provider]  nystatin (  MYCOSTATIN/NYSTOP) powder Apply 1 Application topically See admin instructions. Apply under stomach and between legs 04/11/19   [provider]  Omega 3-6-9 Fatty Acids (OMEGA 3-6-9 PO) Take 1 capsule by mouth daily.    [provider]  omeprazole (PRILOSEC) 20 MG capsule Take 20 mg by mouth 2 (two) times daily. 10/07/20   [provider]  polyethylene glycol (MIRALAX / GLYCOLAX) 17 g packet Take 17 g by mouth daily. Patient taking differently: Take 17 g by mouth daily as needed for mild constipation. 09/12/20   Love, Evlyn Kanner, PA-C  predniSONE (DELTASONE) 5 MG tablet Take 2.5-5 mg by mouth See admin instructions.  Take 1 tablet by mouth in the morning and 1/2 tablet in the evening    [provider]  pregabalin (LYRICA) 200 MG capsule Take 200 mg by mouth 3 (three) times daily.    [provider]  rivaroxaban (XARELTO) 20 MG TABS tablet Take 1 tablet (20 mg total) by mouth daily with supper. Patient taking differently: Take 20 mg by mouth in the morning. 09/12/20   Love, Evlyn Kanner, PA-C  saccharomyces boulardii (FLORASTOR) 250 MG capsule Take 1 capsule (250 mg total) by mouth 2 (two) times daily. Patient not taking: Reported on 08/05/2022 08/05/20   Cleora Fleet, MD  tamsulosin (FLOMAX) 0.4 MG CAPS capsule Take 1 capsule (0.4 mg total) by mouth daily. 08/10/22   Azucena Fallen, MD  TURMERIC PO Take 1 capsule by mouth daily.    [provider]  Vitamin A 2400 MCG (8000 UT) CAPS Take 2,400 mcg by mouth daily.    [provider]  zinc gluconate 50 MG tablet Take 50 mg by mouth every evening.    [provider]      Allergies    Bee venom, Sglt2 inhibitors, and Other    Review of Systems   Review of Systems  Genitourinary:  Positive for hematuria.    Physical Exam Updated Vital Signs BP 132/74   Pulse (!) 105   Temp 98.2 F (36.8 C) (Oral)   Resp 17   Ht 1.981 m (6\' 6" )   Wt (!) 235.8 kg   SpO2 97%   BMI 60.07 kg/m  Physical Exam Vitals and nursing note reviewed.  Constitutional:      General: He is not in acute distress.    Appearance: He is well-developed. He is morbidly obese.  HENT:     Head: Normocephalic and atraumatic.     Right Ear: External ear normal.     Left Ear: External ear normal.  Eyes:     General: No scleral icterus.       Right eye: No discharge.        Left eye: No discharge.     Conjunctiva/sclera: Conjunctivae normal.  Neck:     Trachea: No tracheal deviation.  Cardiovascular:     Rate and Rhythm: Normal rate and regular rhythm.  Pulmonary:     Effort: Pulmonary effort is normal. No respiratory distress.      Breath sounds: Normal breath sounds. No stridor. No wheezing or rales.  Abdominal:     General: Bowel sounds are normal. There is no distension.     Palpations: Abdomen is soft.     Tenderness: There is no abdominal tenderness. There is no guarding or rebound.  Musculoskeletal:        General: No tenderness or deformity.     Cervical back: Neck supple.  Skin:    General:  Skin is warm and dry.     Findings: No rash.  Neurological:     General: No focal deficit present.     Mental Status: He is alert.     Cranial Nerves: No cranial nerve deficit, dysarthria or facial asymmetry.     Sensory: No sensory deficit.     Motor: No abnormal muscle tone or seizure activity.     Coordination: Coordination normal.  Psychiatric:        Mood and Affect: Mood normal.     ED Results / Procedures / Treatments   Labs (all labs ordered are listed, but only abnormal results are displayed) Labs Reviewed  CBC  BASIC METABOLIC PANEL  URINALYSIS, W/ REFLEX TO CULTURE (INFECTION SUSPECTED)    EKG None  Radiology No results found.  Procedures Procedures  {Document cardiac monitor, telemetry assessment procedure when appropriate:1}  Medications Ordered in ED Medications - No data to display  ED Course/ Medical Decision Making/ A&P   {   Click here for ABCD2, HEART and other calculatorsREFRESH Note before signing :1}                              Medical Decision Making Amount and/or Complexity of Data Reviewed Labs: ordered.   ***  {Document critical care time when appropriate:1} {Document review of labs and clinical decision tools ie heart score, Chads2Vasc2 etc:1}  {Document your independent review of radiology images, and any outside records:1} {Document your discussion with family members, caretakers, and with consultants:1} {Document social determinants of health affecting pt's care:1} {Document your decision making why or why not admission, treatments were needed:1} Final  Clinical Impression(s) / ED Diagnoses Final diagnoses:  None    Rx / DC Orders ED Discharge Orders     None

## 2023-05-26 LAB — URINALYSIS, W/ REFLEX TO CULTURE (INFECTION SUSPECTED)
Bilirubin Urine: NEGATIVE
Glucose, UA: 50 mg/dL — AB
Ketones, ur: NEGATIVE mg/dL
Leukocytes,Ua: NEGATIVE
Nitrite: NEGATIVE
Protein, ur: 100 mg/dL — AB
RBC / HPF: >50 RBC/hpf (ref 0–5)
Specific Gravity, Urine: 1.034 — ABNORMAL HIGH (ref 1.005–1.030)
pH: 5 (ref 5.0–8.0)

## 2023-05-26 MED ORDER — TAMSULOSIN HCL 0.4 MG PO CAPS: 0.4000 mg | ORAL_CAPSULE | Freq: Every day | ORAL | 0 refills | Status: DC

## 2023-05-26 NOTE — ED Notes (Signed)
Bladder scan 3x, first read 0 mls, 2nd read 0 mls, 3rd read 14 mls. MD notified. 3 cups of water provided per MD.

## 2023-05-26 NOTE — ED Provider Notes (Signed)
I assumed care in signout patient, as patient presented with painless hematuria CT imaging revealed 4 mm ureteral stone However patient has been pain-free.  Patient was otherwise in no acute distress, not septic.  No signs of renal failure.  Patient had difficulty urinating, catheter was placed and urinalysis was sent.  No signs of any clots and patient had no significant dysuria. No signs of UTI.  Patient be placed on Flomax and referred to urology   Zadie Rhine, MD 05/26/23 [phone]

## 2023-05-26 NOTE — Discharge Instructions (Signed)
As we asked, you have a kidney stone on your left side.  You should strain your urine every day to see if it passes.  Take the Flomax every night to help it pass.  You can follow-up with your kidney doctor or the 1 listed.  If you have any fever over 100, vomiting, or severe abdominal or back pain in the next 48=72hrs please return to the ER

## 2023-07-13 NOTE — Progress Notes (Signed)
 Medication Management with Clinical Pharmacist Practitioner [facility] [place] [address] [place] [address] Dept: [phone] Dept Fax: [phone]   Assessment & Plan  ______________________________________________________________________   1. Type 2 diabetes mellitus with diabetic neuropathy, with long-term current use of insulin   (*) (Primary)  The patient's goal A1C is: <7%, FBG 80-130, 2hr PPBG <180. The patient's diabetes is uncontrolled based on most recent A1C of 8.0% on 08/04/2022 Goal Directed Therapy- : on DM medication that reduces CV risk and on high intensity statin Current therapy: Humulin U500 - 80 units breakfast, 70 units lunch, and 70 units dinner - has not received as of this time  Tresiba  70 units twice daily  Humalog 30 units with meals  Ozempic 2mg  weekly - without ozempic x 1 week Changes today:  Replace tresiba  and humalog with: Humulin U500 - 80 units breakfast, 70 units lunch, and 70 units dinner Restart ozempic 2mg  weekly  Blood glucose monitoring recommended -: using CGM. DM Education topics presented and discussed with patient include : self-monitoring, ADA recommended blood sugar targets: Fasting 80-130; 2-hours post prandial <180, definition of A1C, ADA recommended A1C goal of <7%, how medications work , difference between rapid-acting and long-acting insulin , recognizing carbohydrates, foods that do/don't affect blood sugars, carbohydrate counting including measuring in serving size and in grams, acute complications, long-term complications, signs and symptoms of hypoglycemia, treatment of hypoglycemia, rule of 15, signs and symptoms of hyperglycemia, and proper foot care   Risks, benefits, and alternatives of the medications and treatment plan prescribed today were discussed, and patient expressed understanding. Plan follow-up as discussed or as needed if any worsening symptoms or change in condition Patient agreeable to  transition to [facility] for quality, safety, adherence, and cost savings measures.  Will involve pharmacy team to assist with transition.  Patient was made aware of their right to have prescriptions sent to a pharmacy of their choosing that best meets their needs. Future Appointments  Date Time Provider Department Center  07/21/2023 10:40 AM Joesph Johnnye Cedar, PA-C [facility] None  07/22/2023 10:45 AM Golda Debby Sis, FNP [facility] None  10/18/2023  3:00 PM Donnice FORBES Lipps, MD TE END None   ____________________________________________________________________  Subjective ______________________________________________________________________ Carl Hill is a 53 y.o. male who presents today for Initial visit for Diabetes  Patient with history of DM2, referred to CPP for help with access to medications   Patient with most recent visit to Dr. Lipps had been instructed to replace tresiba  and humalog with humulin U500 - patient reports that he has not yet picked up rx from pharmacy so he has not yet started, reports he is out of refills of ozempic and has been without ozempic x 1 week as well.  Patient denies any AE from current medications - notes that cost is often times difficult for him as he is on a number of brand name medications.  Patient is currently using dexcom g7 to monitor BG - most recent 14 day average of 226 - GMI of 8.7% - BG stable throughout the day averaging around 226. Patient reports that diet is limited by what is available / cooked for him  - tries to be mindful as he is able to be of the carbohydrates at his meals, but cannot always eat low carb options - does not have any scheduled exercise or activity at this time.  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  Plan at this time will be to send humulin U500 and  ozempic prescriptions to [facility] to see if this would help with cost. Should medications remain unaffordable can explore patient assistance programs for patient.  Will investigate cost of mounjaro - if affordable can consider switching with next appointment -  Current Diabetes Diabetic Medications     Incretin Mimetic Agents     * Semaglutide, 2 MG/DOSE, (OZEMPIC, 2 MG/DOSE,) 8 MG/3ML SOPN pen Inject 2 mg into the skin once a week.     Insulin      * insulin  lispro, Human, (HUMALOG KWIKPEN) 200 unit/mL injection Inject thirty Units into the skin 3 (three) times daily with meals.   * Insulin  Regular Human, Conc, (HUMULIN R  U-500 KWIKPEN) 500 Units/mL SOPN Inject eighty Units into the skin 30 (thirty) minutes before breakfast AND seventy Units 30 (thirty) minutes before lunch AND seventy Units 30 (thirty) minutes before supper.   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  Novolog  Levemir  Lantus  Lyumjev Humalog u200 Invokana  Victoza Metformin  Farxiga     Missed doses of medication: reports he has been without ozempic x 1 week  - has not yet started humulin U500  Patient Interview/ Review of systems:    The patient states they have the following symptoms of:  Hypoglycemia: none Hyperglycemia: blurry vision and fatigue Stomach upset/nausea: no  Bowel changes:no  Urinary changes/ UTI/Yeast Infx: previous infection on invokana   myalgia, fatigue, abdominal pain?:  no  vision changes: no  new foot sores/ blisters in the feet?: no    Personal or Family History of Medullary Thyroid cancer: no   Hx of gastroparesis: no   Hx of pancreatitis: no    The patient provides the following BG monitoring information:      Is it difficult to pay for medications? YES  Assessment of medication administration- :  patient able to verbalize appropriate administration/technique Assessment of Medication Adherence- the patient : manages their own medications, identifies skipped/missed doses of medications, and reports it is difficult to pay for medications Barriers of care: Financial need  Diet Recall: Breakfast: 2 boiled eggs, 2 pieces of sausage, 1 piece of cheese toast  Lunch: 2 sandwiches, chicken w/ broccoli and rice Dinner: meat, vegetable, and carb / can be mac and cheese with steak or hamburger  Snacks: jerky, chips  Beverages: water , if he drinks soda it is diet    Physical Activity Exercise: The patient does exercise. .  Objective ______________________________________________________________________  BP Readings from Last 3 Encounters:  02/02/23 138/83  10/28/22 124/78  03/24/22 131/86    Wt Readings from Last 3 Encounters:  02/02/23 (!) 525 lb (238.1 kg)  10/28/22 (!) 525 lb (238.1 kg)  03/24/22 (!) 500 lb (226.8 kg)   Pulse Readings from Last 3 Encounters:  10/28/22 93  03/24/22 (!) 121  02/05/22 107    Lab Results  Component Value Date   Creatinine 1.12 10/30/2022   Creatinine 1.14 10/30/2022   Creatinine 1.08 10/21/2021   Lab Results  Component Value Date   eGFR 80 10/30/2022    CrCl cannot be calculated (Patient's most recent lab result is older than the maximum 90 days allowed.).   Lab Results  Component Value Date   Cholesterol, Total 184 10/30/2022   LDL 105 (H) 10/30/2022   HDL 38 (L) 10/30/2022   Triglycerides 241 (H) 10/30/2022   Glucose 328 (H) 10/30/2022   Hemoglobin A1c 8.0 (A) 08/04/2022   Alb/Creat Ratio 23 03/25/2020   BUN 17 10/30/2022   AST 14 10/30/2022   ALT (SGPT) 17 10/30/2022   Sodium 140 10/30/2022   Potassium 4.1 10/30/2022   Vitamin B-12 545 10/30/2022   Vit D, 25-Hydroxy 16.7 (L) 03/25/2015    Lab Results  Component Value Date   Hemoglobin A1c 8.0 (A) 08/04/2022   Hemoglobin A1c 8.2 (H) 10/21/2021   Hemoglobin A1c 9.2 (A)  08/26/2021   Hemoglobin A1c 11.0 (A) 05/14/2021    No results found for: FRUCTOSAMINE No results found for: CPEPTIDE    Reviewed and updated this visit by provider: Allergies  Meds        Toribio Furbish, PharmD CPP BC-ADM [facility]  07/14/2023, 7:29 AM   *Some images could not be shown.

## 2024-05-01 ENCOUNTER — Inpatient Hospital Stay (HOSPITAL_COMMUNITY)
Admission: EM | Admit: 2024-05-01 | Discharge: 2024-05-07 | DRG: 638 | Disposition: A | Discharge status: Home Health | Attending: Internal Medicine | Admitting: Internal Medicine

## 2024-05-01 ENCOUNTER — Other Ambulatory Visit: Payer: Self-pay

## 2024-05-01 DIAGNOSIS — I10 Essential (primary) hypertension: Secondary | ICD-10-CM | POA: Diagnosis present

## 2024-05-01 DIAGNOSIS — E039 Hypothyroidism, unspecified: Secondary | ICD-10-CM | POA: Diagnosis present

## 2024-05-01 DIAGNOSIS — D849 Immunodeficiency, unspecified: Secondary | ICD-10-CM | POA: Diagnosis present

## 2024-05-01 DIAGNOSIS — Z8619 Personal history of other infectious and parasitic diseases: Secondary | ICD-10-CM

## 2024-05-01 DIAGNOSIS — L0390 Cellulitis, unspecified: Secondary | ICD-10-CM | POA: Diagnosis present

## 2024-05-01 DIAGNOSIS — L03115 Cellulitis of right lower limb: Secondary | ICD-10-CM | POA: Diagnosis not present

## 2024-05-01 DIAGNOSIS — I480 Paroxysmal atrial fibrillation: Secondary | ICD-10-CM | POA: Diagnosis present

## 2024-05-01 DIAGNOSIS — Z91030 Bee allergy status: Secondary | ICD-10-CM

## 2024-05-01 DIAGNOSIS — Z833 Family history of diabetes mellitus: Secondary | ICD-10-CM

## 2024-05-01 DIAGNOSIS — E785 Hyperlipidemia, unspecified: Secondary | ICD-10-CM | POA: Diagnosis present

## 2024-05-01 DIAGNOSIS — E1165 Type 2 diabetes mellitus with hyperglycemia: Secondary | ICD-10-CM | POA: Diagnosis present

## 2024-05-01 DIAGNOSIS — E1140 Type 2 diabetes mellitus with diabetic neuropathy, unspecified: Secondary | ICD-10-CM | POA: Diagnosis present

## 2024-05-01 DIAGNOSIS — Z79890 Hormone replacement therapy (postmenopausal): Secondary | ICD-10-CM

## 2024-05-01 DIAGNOSIS — Z79899 Other long term (current) drug therapy: Secondary | ICD-10-CM

## 2024-05-01 DIAGNOSIS — I878 Other specified disorders of veins: Secondary | ICD-10-CM | POA: Diagnosis present

## 2024-05-01 DIAGNOSIS — Z888 Allergy status to other drugs, medicaments and biological substances status: Secondary | ICD-10-CM

## 2024-05-01 DIAGNOSIS — Z794 Long term (current) use of insulin: Secondary | ICD-10-CM

## 2024-05-01 DIAGNOSIS — Z7901 Long term (current) use of anticoagulants: Secondary | ICD-10-CM

## 2024-05-01 DIAGNOSIS — Z7952 Long term (current) use of systemic steroids: Secondary | ICD-10-CM

## 2024-05-01 DIAGNOSIS — J45909 Unspecified asthma, uncomplicated: Secondary | ICD-10-CM | POA: Diagnosis present

## 2024-05-01 DIAGNOSIS — E2740 Unspecified adrenocortical insufficiency: Secondary | ICD-10-CM | POA: Diagnosis present

## 2024-05-01 DIAGNOSIS — Z6841 Body Mass Index (BMI) 40.0 and over, adult: Secondary | ICD-10-CM

## 2024-05-01 DIAGNOSIS — Z87891 Personal history of nicotine dependence: Secondary | ICD-10-CM

## 2024-05-01 DIAGNOSIS — E11628 Type 2 diabetes mellitus with other skin complications: Principal | ICD-10-CM | POA: Diagnosis present

## 2024-05-01 LAB — CBC WITH DIFFERENTIAL/PLATELET
Abs Immature Granulocytes: 0.03 K/uL (ref 0.00–0.07)
Basophils Absolute: 0.1 K/uL (ref 0.0–0.1)
Basophils Relative: 1 %
Eosinophils Absolute: 0.1 K/uL (ref 0.0–0.5)
Eosinophils Relative: 1 %
HCT: 44.8 % (ref 39.0–52.0)
Hemoglobin: 13.7 g/dL (ref 13.0–17.0)
Immature Granulocytes: 0 %
Lymphocytes Relative: 21 %
Lymphs Abs: 2.1 K/uL (ref 0.7–4.0)
MCH: 25.1 pg — ABNORMAL LOW (ref 26.0–34.0)
MCHC: 30.6 g/dL (ref 30.0–36.0)
MCV: 82.2 fL (ref 80.0–100.0)
Monocytes Absolute: 0.6 K/uL (ref 0.1–1.0)
Monocytes Relative: 6 %
Neutro Abs: 7.1 K/uL (ref 1.7–7.7)
Neutrophils Relative %: 71 %
Platelets: 223 K/uL (ref 150–400)
RBC: 5.45 MIL/uL (ref 4.22–5.81)
RDW: 17.2 % — ABNORMAL HIGH (ref 11.5–15.5)
WBC: 10 K/uL (ref 4.0–10.5)
nRBC: 0 % (ref 0.0–0.2)

## 2024-05-01 LAB — BASIC METABOLIC PANEL WITH GFR
Anion gap: 9 (ref 5–15)
BUN: 16 mg/dL (ref 6–20)
CO2: 28 mmol/L (ref 22–32)
Calcium: 8.8 mg/dL — ABNORMAL LOW (ref 8.9–10.3)
Chloride: 101 mmol/L (ref 98–111)
Creatinine, Ser: 1.02 mg/dL (ref 0.61–1.24)
GFR, Estimated: >60 mL/min (ref >60)
Glucose, Bld: 79 mg/dL (ref 70–99)
Potassium: 4 mmol/L (ref 3.5–5.1)
Sodium: 138 mmol/L (ref 135–145)

## 2024-05-01 LAB — I-STAT CG4 LACTIC ACID, ED: Lactic Acid, Venous: 1.9 mmol/L (ref 0.5–1.9)

## 2024-05-01 LAB — CBG MONITORING, ED: Glucose-Capillary: 74 mg/dL (ref 70–99)

## 2024-05-01 LAB — GLUCOSE, CAPILLARY
Glucose-Capillary: 64 mg/dL — ABNORMAL LOW (ref 70–99)
Glucose-Capillary: 102 mg/dL — ABNORMAL HIGH (ref 70–99)

## 2024-05-01 MED ORDER — BISACODYL 5 MG PO TBEC: 5.0000 mg | DELAYED_RELEASE_TABLET | Freq: Every day | ORAL | Status: DC | PRN

## 2024-05-01 MED ORDER — POLYETHYLENE GLYCOL 3350 17 G PO PACK
17.0000 g | PACK | Freq: Every day | ORAL | Status: DC
  Administered 2024-05-03 – 2024-05-04 (×2): 17 g via ORAL
  Filled 2024-05-01 (×3): qty 1

## 2024-05-01 MED ORDER — ROSUVASTATIN CALCIUM 20 MG PO TABS
20.0000 mg | ORAL_TABLET | Freq: Every day | ORAL | Status: DC
  Administered 2024-05-02 – 2024-05-06 (×6): 20 mg via ORAL
  Filled 2024-05-01 (×6): qty 1

## 2024-05-01 MED ORDER — INSULIN ASPART 100 UNIT/ML IJ SOLN
0.0000 [IU] | Freq: Three times a day (TID) | INTRAMUSCULAR | Status: DC
  Administered 2024-05-02 (×2): 3 [IU] via SUBCUTANEOUS
  Administered 2024-05-02: 7 [IU] via SUBCUTANEOUS
  Administered 2024-05-03: 3 [IU] via SUBCUTANEOUS
  Administered 2024-05-03 – 2024-05-04 (×3): 7 [IU] via SUBCUTANEOUS
  Administered 2024-05-04: 11 [IU] via SUBCUTANEOUS
  Administered 2024-05-04 – 2024-05-05 (×3): 7 [IU] via SUBCUTANEOUS
  Administered 2024-05-05 – 2024-05-06 (×2): 11 [IU] via SUBCUTANEOUS
  Administered 2024-05-06: 7 [IU] via SUBCUTANEOUS
  Administered 2024-05-06: 15 [IU] via SUBCUTANEOUS
  Administered 2024-05-07 (×3): 7 [IU] via SUBCUTANEOUS
  Filled 2024-05-01: qty 11
  Filled 2024-05-01: qty 7
  Filled 2024-05-01: qty 11
  Filled 2024-05-01: qty 7
  Filled 2024-05-01: qty 3
  Filled 2024-05-01: qty 7
  Filled 2024-05-01: qty 4
  Filled 2024-05-01 (×2): qty 3
  Filled 2024-05-01 (×3): qty 7
  Filled 2024-05-01: qty 11
  Filled 2024-05-01 (×3): qty 7
  Filled 2024-05-01: qty 3
  Filled 2024-05-01: qty 11
  Filled 2024-05-01: qty 3

## 2024-05-01 MED ORDER — DULOXETINE HCL 20 MG PO CPEP
40.0000 mg | ORAL_CAPSULE | Freq: Every day | ORAL | Status: DC
  Administered 2024-05-02 – 2024-05-07 (×6): 40 mg via ORAL
  Filled 2024-05-01 (×6): qty 2

## 2024-05-01 MED ORDER — LEVOTHYROXINE SODIUM 100 MCG PO TABS
100.0000 ug | ORAL_TABLET | Freq: Every day | ORAL | Status: DC
  Administered 2024-05-02 – 2024-05-07 (×6): 100 ug via ORAL
  Filled 2024-05-01 (×6): qty 1

## 2024-05-01 MED ORDER — ONDANSETRON HCL 4 MG PO TABS: 4.0000 mg | ORAL_TABLET | Freq: Four times a day (QID) | ORAL | Status: DC | PRN

## 2024-05-01 MED ORDER — VANCOMYCIN HCL 10 G IV SOLR
2500.0000 mg | Freq: Once | INTRAVENOUS | Status: AC
  Administered 2024-05-01: 2500 mg via INTRAVENOUS
  Filled 2024-05-01: qty 2500

## 2024-05-01 MED ORDER — DILTIAZEM HCL ER COATED BEADS 120 MG PO CP24
120.0000 mg | ORAL_CAPSULE | Freq: Every day | ORAL | Status: DC
  Administered 2024-05-02 – 2024-05-07 (×6): 120 mg via ORAL
  Filled 2024-05-01 (×6): qty 1

## 2024-05-01 MED ORDER — NORTRIPTYLINE HCL 25 MG PO CAPS
75.0000 mg | ORAL_CAPSULE | Freq: Two times a day (BID) | ORAL | Status: DC
  Administered 2024-05-02 – 2024-05-07 (×12): 75 mg via ORAL
  Filled 2024-05-01 (×13): qty 3

## 2024-05-01 MED ORDER — INSULIN ASPART 100 UNIT/ML IJ SOLN
3.0000 [IU] | Freq: Three times a day (TID) | INTRAMUSCULAR | Status: DC
  Administered 2024-05-02 – 2024-05-05 (×10): 3 [IU] via SUBCUTANEOUS
  Filled 2024-05-01 (×10): qty 3

## 2024-05-01 MED ORDER — ONDANSETRON HCL 4 MG/2ML IJ SOLN: 4.0000 mg | Freq: Four times a day (QID) | INTRAMUSCULAR | Status: DC | PRN

## 2024-05-01 MED ORDER — ALBUTEROL SULFATE (2.5 MG/3ML) 0.083% IN NEBU: 2.5000 mg | INHALATION_SOLUTION | RESPIRATORY_TRACT | Status: DC | PRN

## 2024-05-01 MED ORDER — ACETAMINOPHEN 325 MG PO TABS
650.0000 mg | ORAL_TABLET | Freq: Four times a day (QID) | ORAL | Status: DC | PRN
  Administered 2024-05-02 – 2024-05-07 (×8): 650 mg via ORAL
  Filled 2024-05-01 (×8): qty 2

## 2024-05-01 MED ORDER — PREDNISONE 10 MG PO TABS
5.0000 mg | ORAL_TABLET | Freq: Every day | ORAL | Status: DC
  Administered 2024-05-02 – 2024-05-07 (×6): 5 mg via ORAL
  Filled 2024-05-01 (×9): qty 1

## 2024-05-01 MED ORDER — RIVAROXABAN 10 MG PO TABS
20.0000 mg | ORAL_TABLET | Freq: Every day | ORAL | Status: DC
  Administered 2024-05-02 – 2024-05-07 (×6): 20 mg via ORAL
  Filled 2024-05-01 (×7): qty 2

## 2024-05-01 MED ORDER — ACETAMINOPHEN 650 MG RE SUPP: 650.0000 mg | Freq: Four times a day (QID) | RECTAL | Status: DC | PRN

## 2024-05-01 MED ORDER — SODIUM CHLORIDE 0.9 % IV SOLN
1.0000 g | INTRAVENOUS | Status: DC
  Administered 2024-05-01: 1 g via INTRAVENOUS
  Filled 2024-05-01: qty 10

## 2024-05-01 NOTE — ED Triage Notes (Signed)
 Pt Carl Hill from home with redness, swelling, and blisters to right leg. Pt noted the redness and swelling for awhile but the blisters started today. PTAR reports pt was moving from chair to stretcher and fell on buttocks Pt now complaining of pain to buttocks. Pt has hx of morbid obesity, DM2, neuropathy, afib, and chronic leg wound. Pt denies any fever, n/v/d.

## 2024-05-01 NOTE — H&P (Signed)
 History and Physical  Carl Hill FMW:[id number] DOB: 20-Jun-1970 DOA: 05/01/2024 PCP: Emilio Joesph DEL, PA-C  Chief Complaint: leg pain Historian: patient  HPI:  Carl Hill is a 53 y.o. male with a PMH significant for obesity, atrial fibrillation, insulin -dependent diabetes, HTN, HLD, hypothyroidism, chronic venous stasis, adrenal insufficiency. At baseline, they live at home with their mother (however, their mother is currently in acute rehab after hospitalization) and mostly independent with ADLs.  He states he is able to ambulate short distances independently.  He has good family support at home.  They presented from home to the ED on 05/01/2024 with lower extremity wounds worsening x several days.  He states that he had minor skin tear a couple of weeks ago which was adequately treated with family doing wound care and dressing changes.  About a week ago he had another minor skin tear and had wound care with a dressing placed at his PCP office.  He had not removed the dressing in several days and when he did open it up to see his wound today, there was a large pustule with yellow fluid and blisters.  He also states that the surrounding erythema is increased from his chronic baseline of venous stasis.  He denies any shortness of breath, chest pain.  He is having more difficulty with ambulation at this time due to heaviness or swelling of his leg.  He is chronically on anticoagulation due to his atrial fibrillation and endorses good adherence with his medication. He states that he is on chronic steroids for adrenal insufficiency as prescribed by his endocrinologist and has been for several years.  In the ED, it was found that they had stable vital signs with mild tachycardia of 105.  Significant findings included: WBC 10.0, hemoglobin 13.7, platelets 223.  Unremarkable metabolic panel.  Lactic acid within normal limits.  CBG 74.  Ordered blood cultures.  They were initially treated with  ceftriaxone , vancomycin .   Patient was admitted to medicine service for further workup and management of lower extremity cellulitis as outlined in detail below.  Assessment/Plan Principal Problem:   Cellulitis   RLE Cellulitis-clinically lower extremity infection appears severe with bullous formation and causing ambulatory dysfunction which I believe warrants admission with IV antibiotics.  Systemically, overall he appears to be stable and does not meet SIRS criteria.  Less likely to be DVT given his adherence with anticoagulation but can consider Dopplers.  Certainly has chronic venous and artery disease and significant peripheral neuropathy which in combination do not bode well for his healing.  He is chronically immunosuppressed.  - Continue IV vancomycin  and ceftriaxone .  If not improving with treatment, consider CT to assess for abscess formation.  Consider Pseudomonas coverage.  Patient is diabetic and on steroids.  Maintain strict glucose control. 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   Asthma    Atrial fibrillation (HCC)    Diabetes mellitus without complication (HCC)    Fournier gangrene 06/10/2020   History of cardioversion  3 times    Hypertension    Sepsis due to Streptococcus, group B (HCC) 06/22/2020    Past Surgical History:  Procedure Laterality Date   ABDOMINAL SURGERY     INCISION AND DRAINAGE  PERIRECTAL ABSCESS N/A 06/11/2020   Procedure: IRRIGATION AND DEBRIDEMENT PERINEAL WOUND;  Surgeon: Dasie Leonor CROME, MD;  Location: [facility] OR;  Service: General;  Laterality: N/A;   IRRIGATION AND DEBRIDEMENT ABSCESS N/A 06/10/2020   Procedure: IRRIGATION AND DEBRIDEMENT ABSCESS;  Surgeon: Dasie Leonor CROME, MD;  Location: [facility] OR;  Service: General;  Laterality: N/A;   LAPAROSCOPIC GASTRIC SLEEVE RESECTION       reports that he has been smoking. He has never used smokeless tobacco. He reports that he does not currently use alcohol. He reports that he does not use drugs.  Allergies  Allergen Reactions   Bee Venom Anaphylaxis    Other reaction(s): Unknown Other reaction(s): Unknown Other reaction(s): Unknown    Sglt2 Inhibitors Other (See Comments)    Necrotizing infection of the perineum   Other Hives    Family History  Problem Relation Age of Onset   Diabetes Mother    Healthy Brother    Breast cancer Paternal Grandmother    Pancreatic cancer Paternal Uncle     Prior to Admission medications   Medication Sig Start Date End Date Taking? Authorizing Provider  acetaminophen  (TYLENOL ) 325 MG tablet Take 1-2 tablets (325-650 mg total) by mouth every 4 (four) hours as needed for mild pain. 09/06/20   Love, Sharlet RAMAN, PA-C  albuterol  (VENTOLIN  HFA) 108 (90 Base) MCG/ACT inhaler Inhale 2 puffs into the lungs every 6 (six) hours as needed for wheezing or shortness of breath.    [provider]  amiodarone  (PACERONE ) 200 MG tablet Take 1 tablet (200 mg total) by mouth daily. Patient taking differently: Take 100 mg by mouth daily. Do not take if HR is >60 09/12/20   Love, Sharlet RAMAN, PA-C  atorvastatin  (LIPITOR) 20 MG tablet Take 1 tablet (20 mg total) by mouth every evening. 09/12/20   Love, Sharlet RAMAN, PA-C  Cholecalciferol 50 MCG (2000 UT) TABS Take 2,000 Units by mouth at bedtime.    [provider]  diltiazem  (CARDIZEM  CD) 240 MG 24 hr capsule Take 240 mg by mouth daily. 07/06/22    [provider]  docusate sodium  (COLACE) 100 MG capsule Take 1 capsule (100 mg total) by mouth daily. Patient taking differently: Take 100 mg by mouth See admin instructions. Take 1 capsule by mouth every other night 09/12/20   Love, Pamela S, PA-C  DULoxetine  (CYMBALTA ) 20 MG capsule Take 20 mg by mouth 2 (two) times daily. For neuropathy    [provider]  ezetimibe  (ZETIA ) 10 MG tablet Take 10 mg by mouth daily. Patient not taking: Reported on 08/05/2022    [provider]  insulin  aspart (NOVOLOG ) 100 UNIT/ML FlexPen Inject 10 Units into the skin 3 (three) times daily with meals. Patient taking differently: Inject 20 Units into the skin 3 (three) times daily with meals. 09/12/20   Love, Sharlet RAMAN, PA-C  insulin  degludec (TRESIBA  FLEXTOUCH) 200 UNIT/ML FlexTouch Pen Inject 60 Units into the skin 2 (two) times daily. 08/10/22   Lue Elsie BROCKS, MD  Insulin  Pen Needle (COMFORT EZ PEN NEEDLES) 32G X 6 MM MISC 1 application by Does not apply route with breakfast, with lunch, and with evening meal. 09/12/20   Love, Sharlet RAMAN, PA-C  levothyroxine  (SYNTHROID ) 100 MCG tablet Take 100 mcg  by mouth daily. 04/03/20   [provider]  magnesium  oxide (MAG-OX) 400 (240 Mg) MG tablet Take 400 mg by mouth every evening.    [provider]  melatonin 3 MG TABS tablet Take 1 tablet (3 mg total) by mouth at bedtime as needed. 08/01/20   Alto Isaiah CROME, NP  Multiple Vitamins-Minerals (ALIVE MENS ENERGY PO) Take 1 tablet by mouth daily.    [provider]  nortriptyline  (PAMELOR ) 75 MG capsule Take 75 mg by mouth 2 (two) times daily. For neuropathy    [provider]  nystatin  (MYCOSTATIN /NYSTOP ) powder Apply 1 Application topically See admin instructions. Apply under stomach and between legs 04/11/19   [provider]  Omega 3-6-9 Fatty Acids (OMEGA 3-6-9 PO) Take 1 capsule by mouth daily.    [provider]  omeprazole (PRILOSEC) 20 MG  capsule Take 20 mg by mouth 2 (two) times daily. 10/07/20   [provider]  polyethylene glycol (MIRALAX  / GLYCOLAX ) 17 g packet Take 17 g by mouth daily. Patient taking differently: Take 17 g by mouth daily as needed for mild constipation. 09/12/20   Love, Sharlet RAMAN, PA-C  predniSONE  (DELTASONE ) 5 MG tablet Take 2.5-5 mg by mouth See admin instructions. Take 1 tablet by mouth in the morning and 1/2 tablet in the evening    [provider]  pregabalin  (LYRICA ) 200 MG capsule Take 200 mg by mouth 3 (three) times daily.    [provider]  rivaroxaban  (XARELTO ) 20 MG TABS tablet Take 1 tablet (20 mg total) by mouth daily with supper. Patient taking differently: Take 20 mg by mouth in the morning. 09/12/20   Love, Sharlet RAMAN, PA-C  saccharomyces boulardii (FLORASTOR) 250 MG capsule Take 1 capsule (250 mg total) by mouth 2 (two) times daily. Patient not taking: Reported on 08/05/2022 08/05/20   Vicci Afton CROME, MD  tamsulosin  (FLOMAX ) 0.4 MG CAPS capsule Take 1 capsule (0.4 mg total) by mouth daily after supper. 05/26/23   Midge Golas, MD  TURMERIC PO Take 1 capsule by mouth daily.    [provider]  Vitamin A 2400 MCG (8000 UT) CAPS Take 2,400 mcg by mouth daily.    [provider]  zinc  gluconate 50 MG tablet Take 50 mg by mouth every evening.    [provider]   I have personally, briefly reviewed patient's prior medical records in [facility]  Objective: Blood pressure (!) 143/113, pulse (!) 105, temperature 98.1 F (36.7 C), temperature source Oral, resp. rate 20, SpO2 97%.   Constitutional: NAD, calm, comfortable Respiratory: CTAB, no wheezing, no crackles. Normal respiratory effort. No accessory muscle use.  Cardiovascular: RRR, no murmurs / rubs / gallops.  Pitting edema of RLE. Abdomen: soft, NT, ND, no masses or HSM palpated. Skin: Please see clinical images for further detail Neurologic: Alert and oriented x 3. Normal speech.  Grossly non-focal exam. PERRL Psychiatric: Normal mood. Congruent affect.  Labs on Admission: I have personally reviewed admission labs and imaging studies  CBC    Component Value Date/Time   WBC 10.0 05/01/2024 2006   RBC 5.45 05/01/2024 2006   HGB 13.7 05/01/2024 2006   HCT 44.8 05/01/2024 2006   PLT 223 05/01/2024 2006   MCV 82.2 05/01/2024 2006   MCH 25.1 (L) 05/01/2024 2006   MCHC 30.6 05/01/2024 2006   RDW 17.2 (H) 05/01/2024 2006   LYMPHSABS 2.1 05/01/2024 2006   MONOABS 0.6 05/01/2024 2006   EOSABS 0.1 05/01/2024 2006  BASOSABS 0.1 05/01/2024 2006   CMP     Component Value Date/Time   NA 138 05/01/2024 2006   K 4.0 05/01/2024 2006   CL 101 05/01/2024 2006   CO2 28 05/01/2024 2006   GLUCOSE 79 05/01/2024 2006   BUN 16 05/01/2024 2006   CREATININE 1.02 05/01/2024 2006   CALCIUM  8.8 (L) 05/01/2024 2006   PROT 7.0 08/05/2022 0155   ALBUMIN  2.8 (L) 08/05/2022 0155   AST 29 08/05/2022 0155   ALT 17 08/05/2022 0155   ALKPHOS 76 08/05/2022 0155   BILITOT 0.4 08/05/2022 0155   GFRNONAA >60 05/01/2024 2006   Radiological Exams on Admission: No results found.  EKG: Independently reviewed.  Sinus tachycardia, HR 115  DVT prophylaxis: Xarelto   Code Status: Full Family Communication: None at bedside Disposition Plan: Admit to observation with IV antibiotics Consults called: None  Marien LITTIE Piety, DO [facility]  05/01/2024, 9:15 PM    To contact the appropriate [facility] Attending or Consulting provider: Check [email or web address] for coverage from 7pm-7am

## 2024-05-01 NOTE — ED Provider Notes (Signed)
[place] EMERGENCY DEPARTMENT AT [facility] Provider Note   CSN: [id number] Arrival date & time: 05/01/24  8195     Patient presents with: Wound Check   Carl Hill is a 53 y.o. male.   Patient with A-fib history, venous stasis presents with worsening erythema and concerns of worsening wounds in the right lower extremity over the past few days.  Patient did have recent skin tear to that leg.  Patient has neuropathy history.  Patient has diabetes.  Patient denies any persistent fevers.  Patient is on anticoagulation.  The history is provided by the patient.  Wound Check Pertinent negatives include no chest pain, no abdominal pain, no headaches and no shortness of breath.       Prior to Admission medications   Medication Sig Start Date End Date Taking? Authorizing Provider  acetaminophen  (TYLENOL ) 500 MG tablet Take 500-1,000 mg by mouth every 6 (six) hours as needed for headache or mild pain (pain score 1-3).   Yes [provider]  albuterol  (VENTOLIN  HFA) 108 (90 Base) MCG/ACT inhaler Inhale 1-2 puffs into the lungs every 6 (six) hours as needed for wheezing or shortness of breath.   Yes [provider]  diltiazem  (CARDIZEM  CD) 120 MG 24 hr capsule Take 120 mg by mouth. 04/07/24  Yes [provider]  doxycycline  (VIBRA -TABS) 100 MG tablet Take 1 tablet (100 mg total) by mouth 2 (two) times daily for 4 days. 05/07/24 05/11/24 Yes Donnamarie Lebron PARAS, MD  DULoxetine  (CYMBALTA ) 20 MG capsule Take 40 mg by mouth daily. For neuropathy   Yes [provider]  HUMULIN R  U-500 KWIKPEN 500 UNIT/ML KwikPen Inject 90-100 Units into the skin See admin instructions. Inject 100 Units into the skin 30 (thirty) minutes before breakfast AND 100 Units 30 (thirty) minutes before lunch AND 90 Units 30 (thirty) minutes before supper 04/20/24  Yes [provider]  levothyroxine  (SYNTHROID ) 100 MCG tablet Take 100 mcg by mouth daily. 04/03/20  Yes  [provider]  magnesium  oxide (MAG-OX) 400 (240 Mg) MG tablet Take 400 mg by mouth at bedtime.   Yes [provider]  MOUNJARO 10 MG/0.5ML Pen Inject 10 mg into the skin every Sunday. 04/19/24  Yes [provider]  Multiple Vitamins-Minerals (ALIVE MENS ENERGY PO) Take 1 tablet by mouth daily.   Yes [provider]  nortriptyline  (PAMELOR ) 75 MG capsule Take 75 mg by mouth 2 (two) times daily. For neuropathy   Yes [provider]  Omega 3-6-9 Fatty Acids (OMEGA 3-6-9 PO) Take 1 capsule by mouth at bedtime.   Yes [provider]  omeprazole (PRILOSEC) 20 MG capsule Take 20 mg by mouth 2 (two) times daily. 10/07/20  Yes [provider]  OXYGEN Inhale 2 L into the lungs at bedtime as needed (low O2 sat).   Yes [provider]  polyethylene glycol (MIRALAX  / GLYCOLAX ) 17 g packet Take 17 g by mouth daily. 09/12/20  Yes Love, Sharlet RAMAN, PA-C  predniSONE  (DELTASONE ) 5 MG tablet Take 2.5-5 mg by mouth See admin instructions. Take one-half (2.5mg ) tablet by mouth in the morning and one tablet (5mg ) at bedtime   Yes [provider]  pregabalin  (LYRICA ) 200 MG capsule Take 200 mg by mouth 3 (three) times daily.   Yes [provider]  rivaroxaban  (XARELTO ) 20 MG TABS tablet Take 1 tablet (20 mg total) by mouth daily with supper. Patient taking differently: Take 20 mg by mouth in the morning. 09/12/20  Yes Love, Sharlet RAMAN, PA-C  rosuvastatin  (CRESTOR ) 20 MG tablet Take 20 mg by mouth at bedtime. 11/15/23  Yes [provider]  Turmeric (QC TUMERIC COMPLEX PO) Take 2,250 mg by mouth at bedtime. QUNOL TURMERIC with BLACK PEPPER & GINGER 2250mg  per capsule   Yes [provider]  Cholecalciferol 50 MCG (2000 UT) TABS Take 2,000 Units by mouth at bedtime. Patient not taking: Reported on 05/01/2024    [provider]  Nystatin  (GERHARDT'S BUTT CREAM) CREA Apply 1 Application topically 3 (three) times daily.  05/07/24 06/06/24  Ezenduka, Nkeiruka J, MD    Allergies: Bee venom, Sglt2 inhibitors, and Other    Review of Systems  Constitutional:  Negative for chills and fever.  HENT:  Negative for congestion.   Eyes:  Negative for visual disturbance.  Respiratory:  Negative for shortness of breath.   Cardiovascular:  Positive for leg swelling. Negative for chest pain.  Gastrointestinal:  Negative for abdominal pain and vomiting.  Genitourinary:  Negative for dysuria and flank pain.  Musculoskeletal:  Negative for back pain, neck pain and neck stiffness.  Skin:  Positive for rash and wound.  Neurological:  Negative for light-headedness and headaches.    Updated Vital Signs BP (!) 152/96 (BP Location: Left Arm)   Pulse (!) 104   Temp 98 F (36.7 C)   Resp 18   Ht 6' 6 (1.981 m)   Wt (!) 249 kg   SpO2 96%   BMI 63.44 kg/m   Physical Exam Vitals and nursing note reviewed.  Constitutional:      General: He is not in acute distress.    Appearance: He is well-developed.  HENT:     Head: Normocephalic and atraumatic.     Mouth/Throat:     Mouth: Mucous membranes are moist.  Eyes:     General:        Right eye: No discharge.        Left eye: No discharge.     Conjunctiva/sclera: Conjunctivae normal.  Neck:     Trachea: No tracheal deviation.  Cardiovascular:     Rate and Rhythm: Normal rate and regular rhythm.  Pulmonary:     Effort: Pulmonary effort is normal.     Breath sounds: Normal breath sounds.  Abdominal:     General: There is no distension.     Palpations: Abdomen is soft.     Tenderness: There is no abdominal tenderness. There is no guarding.  Musculoskeletal:     Cervical back: Normal range of motion and neck supple.  Skin:    General: Skin is warm.     Capillary Refill: Capillary refill takes less than 2 seconds.     Findings: Erythema and rash present.     Comments: Patient has multiple superficial wounds with mild drainage anterior right lower extremity.   Bilateral lower extremity edema worse on the right.  No calf tenderness.  Patient has mild warmth and erythema anterior with few areas of blistering and fluid-filled right lower extremity.  No crepitus.  Distal perfusion intact in the right foot and ankle.  Neurological:     General: No focal deficit present.     Mental Status: He is alert.  Psychiatric:        Mood and Affect: Mood normal.     (all labs ordered are listed, but only abnormal results are displayed) Labs Reviewed  CBC WITH DIFFERENTIAL/PLATELET - Abnormal; Notable for the following components:      Result Value  MCH 25.1 (*)    RDW 17.2 (*)    All other components within normal limits  BASIC METABOLIC PANEL WITH GFR - Abnormal; Notable for the following components:   Calcium  8.8 (*)    All other components within normal limits  BASIC METABOLIC PANEL WITH GFR - Abnormal; Notable for the following components:   Glucose, Bld 159 (*)    Calcium  8.3 (*)    All other components within normal limits  CBC - Abnormal; Notable for the following components:   MCH 25.0 (*)    RDW 17.3 (*)    All other components within normal limits  HEMOGLOBIN A1C - Abnormal; Notable for the following components:   Hgb A1c MFr Bld 9.1 (*)    All other components within normal limits  GLUCOSE, CAPILLARY - Abnormal; Notable for the following components:   Glucose-Capillary 64 (*)    All other components within normal limits  GLUCOSE, CAPILLARY - Abnormal; Notable for the following components:   Glucose-Capillary 102 (*)    All other components within normal limits  GLUCOSE, CAPILLARY - Abnormal; Notable for the following components:   Glucose-Capillary 128 (*)    All other components within normal limits  GLUCOSE, CAPILLARY - Abnormal; Notable for the following components:   Glucose-Capillary 177 (*)    All other components within normal limits  GLUCOSE, CAPILLARY - Abnormal; Notable for the following components:   Glucose-Capillary 213  (*)    All other components within normal limits  HEMOGLOBIN A1C - Abnormal; Notable for the following components:   Hgb A1c MFr Bld 9.4 (*)    All other components within normal limits  GLUCOSE, CAPILLARY - Abnormal; Notable for the following components:   Glucose-Capillary 245 (*)    All other components within normal limits  GLUCOSE, CAPILLARY - Abnormal; Notable for the following components:   Glucose-Capillary 218 (*)    All other components within normal limits  GLUCOSE, CAPILLARY - Abnormal; Notable for the following components:   Glucose-Capillary 243 (*)    All other components within normal limits  CBC WITH DIFFERENTIAL/PLATELET - Abnormal; Notable for the following components:   Hemoglobin 12.4 (*)    MCH 25.4 (*)    RDW 17.3 (*)    All other components within normal limits  BASIC METABOLIC PANEL WITH GFR - Abnormal; Notable for the following components:   Glucose, Bld 225 (*)    Calcium  8.1 (*)    All other components within normal limits  VANCOMYCIN , PEAK - Abnormal; Notable for the following components:   Vancomycin  Pk 26 (*)    All other components within normal limits  VANCOMYCIN , TROUGH - Abnormal; Notable for the following components:   Vancomycin  Tr 13 (*)    All other components within normal limits  GLUCOSE, CAPILLARY - Abnormal; Notable for the following components:   Glucose-Capillary 249 (*)    All other components within normal limits  BASIC METABOLIC PANEL WITH GFR - Abnormal; Notable for the following components:   Glucose, Bld 221 (*)    Calcium  8.2 (*)    All other components within normal limits  GLUCOSE, CAPILLARY - Abnormal; Notable for the following components:   Glucose-Capillary 224 (*)    All other components within normal limits  GLUCOSE, CAPILLARY - Abnormal; Notable for the following components:   Glucose-Capillary 195 (*)    All other components within normal limits  GLUCOSE, CAPILLARY - Abnormal; Notable for the following components:    Glucose-Capillary 230 (*)  All other components within normal limits  GLUCOSE, CAPILLARY - Abnormal; Notable for the following components:   Glucose-Capillary 290 (*)    All other components within normal limits  GLUCOSE, CAPILLARY - Abnormal; Notable for the following components:   Glucose-Capillary 225 (*)    All other components within normal limits  BASIC METABOLIC PANEL WITH GFR - Abnormal; Notable for the following components:   Glucose, Bld 230 (*)    Calcium  8.3 (*)    All other components within normal limits  GLUCOSE, CAPILLARY - Abnormal; Notable for the following components:   Glucose-Capillary 201 (*)    All other components within normal limits  GLUCOSE, CAPILLARY - Abnormal; Notable for the following components:   Glucose-Capillary 269 (*)    All other components within normal limits  GLUCOSE, CAPILLARY - Abnormal; Notable for the following components:   Glucose-Capillary 328 (*)    All other components within normal limits  GLUCOSE, CAPILLARY - Abnormal; Notable for the following components:   Glucose-Capillary 221 (*)    All other components within normal limits  BASIC METABOLIC PANEL WITH GFR - Abnormal; Notable for the following components:   Glucose, Bld 180 (*)    Calcium  8.4 (*)    All other components within normal limits  GLUCOSE, CAPILLARY - Abnormal; Notable for the following components:   Glucose-Capillary 206 (*)    All other components within normal limits  GLUCOSE, CAPILLARY - Abnormal; Notable for the following components:   Glucose-Capillary 215 (*)    All other components within normal limits  GLUCOSE, CAPILLARY - Abnormal; Notable for the following components:   Glucose-Capillary 215 (*)    All other components within normal limits  CULTURE, BLOOD (ROUTINE X 2)  CULTURE, BLOOD (ROUTINE X 2)  HIV ANTIBODY (ROUTINE TESTING W REFLEX)  I-STAT CG4 LACTIC ACID, ED  CBG MONITORING, ED    EKG: None  Radiology: No results  found.   Procedures   Medications Ordered in the ED  vancomycin  (VANCOCIN ) 2,500 mg in sodium chloride  0.9 % 500 mL IVPB (0 mg Intravenous Stopped 05/01/24 2241)  insulin  glargine (LANTUS ) injection 15 Units (15 Units Subcutaneous Given 05/05/24 1607)  insulin  glargine (LANTUS ) injection 10 Units (10 Units Subcutaneous Given 05/06/24 1625)                                    Medical Decision Making Amount and/or Complexity of Data Reviewed Labs: ordered.  Risk Decision regarding hospitalization.   Patient on chronic anticoagulation diabetic and neuropathy presents with clinical concern for acute cellulitis on chronic lymphedema/wound care.  Blood work independently reviewed reassuring normal white count, normal hemoglobin, electrolytes unremarkable.  Due to worsening signs and symptoms and chronic medical history with high risk being diabetic IV antibiotics ordered, blood culture sent and discussed case with hospitalist who agreed with plan.  No concern at this time for acute vascular occlusion.  Patient admitted stable.     Final diagnoses:  Cellulitis of right leg    ED Discharge Orders          Ordered    doxycycline  (VIBRA -TABS) 100 MG tablet  2 times daily        05/07/24 1013    Nystatin  (GERHARDT'S BUTT CREAM) CREA  3 times daily        05/07/24 1013               Carin Shipp, MD  05/08/24 0828  

## 2024-05-01 NOTE — Progress Notes (Signed)
 ED Pharmacy Antibiotic Sign Off An antibiotic consult was received from an ED provider for vancomycin  per pharmacy dosing for cellulitis. A chart review was completed to assess appropriateness.   The following one time order(s) were placed:  Vancomycin  2500 mg IV x 1  Further antibiotic and/or antibiotic pharmacy consults should be ordered by the admitting provider if indicated.   Thank you for allowing pharmacy to be a part of this patient's care.   Estevon Fluke, [facility]  Clinical Pharmacist 05/01/24 7:59 PM

## 2024-05-02 ENCOUNTER — Encounter (HOSPITAL_COMMUNITY): Payer: Self-pay | Admitting: Student in an Organized Health Care Education/Training Program

## 2024-05-02 DIAGNOSIS — Z7901 Long term (current) use of anticoagulants: Secondary | ICD-10-CM | POA: Diagnosis not present

## 2024-05-02 DIAGNOSIS — L03115 Cellulitis of right lower limb: Secondary | ICD-10-CM | POA: Diagnosis present

## 2024-05-02 DIAGNOSIS — E785 Hyperlipidemia, unspecified: Secondary | ICD-10-CM | POA: Diagnosis present

## 2024-05-02 DIAGNOSIS — I10 Essential (primary) hypertension: Secondary | ICD-10-CM | POA: Diagnosis present

## 2024-05-02 DIAGNOSIS — Z833 Family history of diabetes mellitus: Secondary | ICD-10-CM | POA: Diagnosis not present

## 2024-05-02 DIAGNOSIS — I878 Other specified disorders of veins: Secondary | ICD-10-CM | POA: Diagnosis present

## 2024-05-02 DIAGNOSIS — Z79890 Hormone replacement therapy (postmenopausal): Secondary | ICD-10-CM | POA: Diagnosis not present

## 2024-05-02 DIAGNOSIS — Z8619 Personal history of other infectious and parasitic diseases: Secondary | ICD-10-CM | POA: Diagnosis not present

## 2024-05-02 DIAGNOSIS — Z7952 Long term (current) use of systemic steroids: Secondary | ICD-10-CM | POA: Diagnosis not present

## 2024-05-02 DIAGNOSIS — Z87891 Personal history of nicotine dependence: Secondary | ICD-10-CM | POA: Diagnosis not present

## 2024-05-02 DIAGNOSIS — E039 Hypothyroidism, unspecified: Secondary | ICD-10-CM | POA: Diagnosis present

## 2024-05-02 DIAGNOSIS — E2740 Unspecified adrenocortical insufficiency: Secondary | ICD-10-CM | POA: Diagnosis present

## 2024-05-02 DIAGNOSIS — J45909 Unspecified asthma, uncomplicated: Secondary | ICD-10-CM | POA: Diagnosis present

## 2024-05-02 DIAGNOSIS — Z79899 Other long term (current) drug therapy: Secondary | ICD-10-CM | POA: Diagnosis not present

## 2024-05-02 DIAGNOSIS — L03119 Cellulitis of unspecified part of limb: Secondary | ICD-10-CM

## 2024-05-02 DIAGNOSIS — I480 Paroxysmal atrial fibrillation: Secondary | ICD-10-CM | POA: Diagnosis present

## 2024-05-02 DIAGNOSIS — E1165 Type 2 diabetes mellitus with hyperglycemia: Secondary | ICD-10-CM | POA: Diagnosis present

## 2024-05-02 DIAGNOSIS — Z794 Long term (current) use of insulin: Secondary | ICD-10-CM | POA: Diagnosis not present

## 2024-05-02 DIAGNOSIS — D849 Immunodeficiency, unspecified: Secondary | ICD-10-CM | POA: Diagnosis present

## 2024-05-02 DIAGNOSIS — E1140 Type 2 diabetes mellitus with diabetic neuropathy, unspecified: Secondary | ICD-10-CM | POA: Diagnosis present

## 2024-05-02 DIAGNOSIS — Z888 Allergy status to other drugs, medicaments and biological substances status: Secondary | ICD-10-CM | POA: Diagnosis not present

## 2024-05-02 DIAGNOSIS — E11628 Type 2 diabetes mellitus with other skin complications: Secondary | ICD-10-CM | POA: Diagnosis present

## 2024-05-02 DIAGNOSIS — M7989 Other specified soft tissue disorders: Secondary | ICD-10-CM | POA: Diagnosis present

## 2024-05-02 DIAGNOSIS — Z91030 Bee allergy status: Secondary | ICD-10-CM | POA: Diagnosis not present

## 2024-05-02 DIAGNOSIS — Z6841 Body Mass Index (BMI) 40.0 and over, adult: Secondary | ICD-10-CM | POA: Diagnosis not present

## 2024-05-02 LAB — CBC
HCT: 42.5 % (ref 39.0–52.0)
Hemoglobin: 13.1 g/dL (ref 13.0–17.0)
MCH: 25 pg — ABNORMAL LOW (ref 26.0–34.0)
MCHC: 30.8 g/dL (ref 30.0–36.0)
MCV: 81.3 fL (ref 80.0–100.0)
Platelets: 218 K/uL (ref 150–400)
RBC: 5.23 MIL/uL (ref 4.22–5.81)
RDW: 17.3 % — ABNORMAL HIGH (ref 11.5–15.5)
WBC: 9.6 K/uL (ref 4.0–10.5)
nRBC: 0 % (ref 0.0–0.2)

## 2024-05-02 LAB — BASIC METABOLIC PANEL WITH GFR
Anion gap: 10 (ref 5–15)
BUN: 15 mg/dL (ref 6–20)
CO2: 26 mmol/L (ref 22–32)
Calcium: 8.3 mg/dL — ABNORMAL LOW (ref 8.9–10.3)
Chloride: 102 mmol/L (ref 98–111)
Creatinine, Ser: 1.13 mg/dL (ref 0.61–1.24)
GFR, Estimated: >60 mL/min (ref >60)
Glucose, Bld: 159 mg/dL — ABNORMAL HIGH (ref 70–99)
Potassium: 3.7 mmol/L (ref 3.5–5.1)
Sodium: 138 mmol/L (ref 135–145)

## 2024-05-02 LAB — HIV ANTIBODY (ROUTINE TESTING W REFLEX): HIV Screen 4th Generation wRfx: NONREACTIVE

## 2024-05-02 LAB — GLUCOSE, CAPILLARY
Glucose-Capillary: 128 mg/dL — ABNORMAL HIGH (ref 70–99)
Glucose-Capillary: 177 mg/dL — ABNORMAL HIGH (ref 70–99)
Glucose-Capillary: 213 mg/dL — ABNORMAL HIGH (ref 70–99)
Glucose-Capillary: 245 mg/dL — ABNORMAL HIGH (ref 70–99)

## 2024-05-02 MED ORDER — JUVEN PO PACK
1.0000 | PACK | Freq: Two times a day (BID) | ORAL | Status: DC
  Administered 2024-05-02 – 2024-05-07 (×10): 1 via ORAL
  Filled 2024-05-02 (×11): qty 1

## 2024-05-02 MED ORDER — ADULT MULTIVITAMIN W/MINERALS CH
1.0000 | ORAL_TABLET | Freq: Every day | ORAL | Status: DC
  Administered 2024-05-02 – 2024-05-07 (×6): 1 via ORAL
  Filled 2024-05-02 (×6): qty 1

## 2024-05-02 MED ORDER — SODIUM CHLORIDE 0.9 % IV SOLN
2.0000 g | Freq: Every day | INTRAVENOUS | Status: DC
  Administered 2024-05-02 – 2024-05-07 (×6): 2 g via INTRAVENOUS
  Filled 2024-05-02 (×6): qty 20

## 2024-05-02 MED ORDER — VANCOMYCIN HCL 1500 MG/300ML IV SOLN
1500.0000 mg | Freq: Three times a day (TID) | INTRAVENOUS | Status: DC
  Filled 2024-05-02: qty 300

## 2024-05-02 MED ORDER — VANCOMYCIN HCL 10 G IV SOLR
2500.0000 mg | Freq: Once | INTRAVENOUS | Status: DC
  Filled 2024-05-02: qty 25

## 2024-05-02 MED ORDER — ENSURE MAX PROTEIN PO LIQD
11.0000 [oz_av] | Freq: Every day | ORAL | Status: DC
  Administered 2024-05-02 – 2024-05-07 (×6): 11 [oz_av] via ORAL
  Filled 2024-05-02 (×6): qty 330

## 2024-05-02 MED ORDER — PREGABALIN 100 MG PO CAPS
200.0000 mg | ORAL_CAPSULE | Freq: Three times a day (TID) | ORAL | Status: DC
  Administered 2024-05-02 – 2024-05-07 (×18): 200 mg via ORAL
  Filled 2024-05-02 (×18): qty 2

## 2024-05-02 MED ORDER — VANCOMYCIN HCL 1500 MG/300ML IV SOLN
1500.0000 mg | Freq: Three times a day (TID) | INTRAVENOUS | Status: DC
  Administered 2024-05-02 – 2024-05-07 (×13): 1500 mg via INTRAVENOUS
  Filled 2024-05-02 (×18): qty 300

## 2024-05-02 NOTE — Progress Notes (Signed)
 Initial Nutrition Assessment  DOCUMENTATION CODES:   Morbid obesity  INTERVENTION:  1 packet Juven BID to support wound healing. Each packet provides 95 calories, 2.5 grams of protein (collagen), and 9.8 grams of carbohydrate (3 grams sugar); also contains 7 grams of L-arginine and L-glutamine, 300 mg vitamin C, 15 mg vitamin E, 1.2 mcg vitamin B-12, 9.5 mg zinc , 200 mg calcium , and 1.5 g Calcium  Beta-hydroxy-Beta-methylbutyrate. Multivitamin PO once daily. Double protein allowance for meals. Ensure Max PO once daily. Each supplement provides 150 Kcals and 30 grams protein.  Continue regular diet.   NUTRITION DIAGNOSIS:   Increased nutrient needs related to wound healing as evidenced by estimated needs.    GOAL:   Patient will meet greater than or equal to 90% of their needs    MONITOR:   PO intake, Supplement acceptance, Labs, Weight trends, Skin  REASON FOR ASSESSMENT:   Consult Assessment of nutrition requirement/status, Wound healing, Diet education  ASSESSMENT:   Patient presented with worsening LE wounds and was found to have cellulitis and severe peripheral neuropathy. PMH significant for DM2, Afib, HTN, dyslipidemia, gastric sleeve., chronic venous stasis, adrenal insufficiency.  Visited the patient who denies any appetite or PO intake issues. His UBW is 550 lbs and it has been stable. He drinks Ensure Max once daily in the morning at home. He ambulates with a walker but has neuropathy from the knees down. He had his gastric sleeve surgery about 10 years ago and lost some weight with it but has since fallen off the wagon. We discussed increased protein intake and Juven for wound healing. We also talked about his carbohydrate intake which he tells me he does monitor.  Scheduled Meds:  diltiazem   120 mg Oral Daily   DULoxetine   40 mg Oral Daily   insulin  aspart  0-20 Units Subcutaneous TID WC   insulin  aspart  3 Units Subcutaneous TID WC   levothyroxine   100 mcg  Oral Daily   nortriptyline   75 mg Oral BID   polyethylene glycol  17 g Oral Daily   predniSONE   5 mg Oral Q breakfast   pregabalin   200 mg Oral TID   rivaroxaban   20 mg Oral Q breakfast   rosuvastatin   20 mg Oral QHS   Continuous Infusions:  cefTRIAXone  (ROCEPHIN )  IV 2 g (05/02/24 1004)    Diet Order             Diet regular Room service appropriate? Yes; Fluid consistency: Thin  Diet effective now                  Meal Intake: 100% per patient  Labs:     Latest Ref Rng & Units 05/02/2024    1:57 AM 05/01/2024    8:06 PM 05/25/2023    7:31 PM  CMP  Glucose 70 - 99 mg/dL 840  79  802   BUN 6 - 20 mg/dL 15  16  12    Creatinine 0.61 - 1.24 mg/dL 8.86  8.97  8.99   Sodium 135 - 145 mmol/L 138  138  134   Potassium 3.5 - 5.1 mmol/L 3.7  4.0  4.3   Chloride 98 - 111 mmol/L 102  101  97   CO2 22 - 32 mmol/L 26  28  26    Calcium  8.9 - 10.3 mg/dL 8.3  8.8  8.8       I/O: +350 mL since admit  NUTRITION - FOCUSED PHYSICAL EXAM:  Flowsheet Row Most Recent Value  Orbital Region No depletion  Upper Arm Region No depletion  Thoracic and Lumbar Region No depletion  Buccal Region No depletion  Temple Region No depletion  Clavicle Bone Region No depletion  Clavicle and Acromion Bone Region Unable to assess  [due to body habitus]  Scapular Bone Region Unable to assess  [due to body habitus]  Dorsal Hand No depletion  Patellar Region Unable to assess  [due to body habitus]  Anterior Thigh Region Unable to assess  [due to body habitus]  Posterior Calf Region Unable to assess  [due to body habitus]  Edema (RD Assessment) Mild  Hair Reviewed  Eyes Reviewed  Mouth Reviewed  Skin Reviewed  Nails Reviewed    EDUCATION NEEDS:   Education needs have been addressed  Skin:  Skin Assessment: Reviewed RN Assessment  Last BM:  11/29  Height:   Ht Readings from Last 1 Encounters:  05/01/24 6' 6 (1.981 m)    Weight:    Weight Change: no significant weight change  from UBW  Usual Body Weight: 550 lbs per patient  Edema: +1 RLE, non-pitting LLE  Ideal Body Weight:  97 kg   BMI:  Body mass index is 63.56 kg/m.  Estimated Nutritional Needs:  Kcal:  2500-2900 Protein:  175-195 Fluid:  >2500    Carl Ruth, MS, RDN, LDN [place]. [facility] See AMION for contact information Secure chat preferred

## 2024-05-02 NOTE — Plan of Care (Signed)

## 2024-05-02 NOTE — Progress Notes (Addendum)
 Pharmacy Antibiotic Note  Carl Hill is a 53 y.o. male admitted on 05/01/2024 with cellulitis.  Pharmacy has been consulted for vancomycin  dosing.  Plan: Vancomycin  2500 mg IV x1 given 12/01 @2030 , follow with vancomycin  1500 mg IV Q8h (eAUC 417, goal AUC 400-550) Trend WBC, fever, renal function F/u cultures, clinical progress, levels as indicated De-escalate when able   Height: 6' 6 (198.1 cm) Weight: (!) 249.5 kg (550 lb) IBW/kg (Calculated) : 91.4  Temp (24hrs), Avg:97.9 F (36.6 C), Min:97.6 F (36.4 C), Max:98.1 F (36.7 C)  Recent Labs  Lab 05/01/24 2006 05/01/24 2012 05/02/24 0157  WBC 10.0  --  9.6  CREATININE 1.02  --  1.13  LATICACIDVEN  --  1.9  --     Estimated Creatinine Clearance: 167.2 mL/min (by C-G formula based on SCr of 1.13 mg/dL).    Allergies  Allergen Reactions   Bee Venom Anaphylaxis    Other reaction(s): Unknown Other reaction(s): Unknown Other reaction(s): Unknown    Sglt2 Inhibitors Other (See Comments)    Necrotizing infection of the perineum   Other Hives    Thank you for allowing pharmacy to be a part of this patient's care.  Shelba Collier, PharmD, BCPS Clinical Pharmacist

## 2024-05-02 NOTE — Care Management Obs Status (Signed)
 MEDICARE OBSERVATION STATUS NOTIFICATION   Patient Details  Name: RANA ADORNO MRN: [id number] Date of Birth: [birth year]   Medicare Observation Status Notification Given:  Yes  Obs notice signed and copy given.   Rahmah Mccamy 05/02/2024, 10:45 AM

## 2024-05-02 NOTE — Progress Notes (Signed)
 PROGRESS NOTE    Carl Hill  FMW:[id number] DOB: [birth year] DOA: 05/01/2024 PCP: Emilio Joesph DEL, PA-C   Brief Narrative:  Carl Hill is a 53 y.o. male with a PMH significant for obesity, atrial fibrillation, insulin -dependent diabetes, HTN, HLD, hypothyroidism, chronic venous stasis, adrenal insufficiency. At baseline lives with mother - is able to ambulate short distances independently.   Patient presented from home with lower extremity wounds appear to be worsening over the past week, noted minor skin tear previously a month ago and again last week being treated at home with worsening surrounding erythema pain and swelling.  Assessment & Plan:   Principal Problem:   Cellulitis  RLE Cellulitis - does not meet sepsis criteria, POA Immunosuppressed on chronic steroids - Continue IV antibiotics, given history broad-spectrum antibiotics with ceftriaxone  and vancomycin  - Given improvement we will hold off on broadening antibiotics for pseudomonal coverage -he is at risk given diabetes/steroid use - PT OT to follow along, unclear if pain will limit patient's ambulatory status or not - Cultures pending, do not expect disseminated infection at this time - Consult wound care nurse, patient would benefit from home health wound care   Insulin -dependent type 2 diabetes mellitus - Previously uncontrolled(last A1c 8.0) -  repeat A1c pending -Continue sliding scale insulin , hypoglycemic protocol, diabetic diet   Severe peripheral neuropathy - Continue home duloxetine , nortriptyline  Hypothyroidism - Continue home levothyroxine  Atrial fibrillation - Continue home Cardizem , Xarelto  HLD - Continue home rosuvastatin  DVT prophylaxis: rivaroxaban  (XARELTO ) tablet 20 mg Start: 05/02/24 0700 rivaroxaban  (XARELTO ) tablet 20 mg   Code Status:   Code Status: Full Code  Family Communication: None present  Status is: Inpatient  Dispo: The patient is from: Home              Anticipated  d/c is to: Home              Anticipated d/c date is: 24 to 48 hours              Patient currently not medically stable for discharge  Consultants:  None  Procedures:  None  Antimicrobials:  Ceftriaxone , vancomycin  12/1 -ongoing  Subjective: No acute issues or events overnight denies nausea vomiting diarrhea constipation headache fevers chills or chest pain  Objective: Vitals:   05/01/24 2215 05/01/24 2338 05/02/24 0446 05/02/24 0736  BP: (!) 108/93  135/80 (!) 156/77  Pulse: (!) 108  (!) 101 96  Resp: 20     Temp: 97.8 F (36.6 C)  98.1 F (36.7 C) 97.6 F (36.4 C)  TempSrc: Oral  Oral Oral  SpO2: 99%  96% 96%  Weight:  (!) 249.5 kg    Height:  6' 6 (1.981 m)      Intake/Output Summary (Last 24 hours) at 05/02/2024 0746 Last data filed at 05/02/2024 0409 Gross per 24 hour  Intake 348.86 ml  Output --  Net 348.86 ml   Filed Weights   05/01/24 2338  Weight: (!) 249.5 kg    Examination:  General:  Pleasantly resting in bed, No acute distress. HEENT:  Normocephalic atraumatic.  Sclerae nonicteric, noninjected.  Extraocular movements intact bilaterally. Neck:  Without mass or deformity.  Trachea is midline. Lungs:  Clear to auscultate bilaterally without rhonchi, wheeze, or rales. Heart:  Regular rate and rhythm.  Without murmurs, rubs, or gallops. Abdomen:  Soft, nontender, nondistended.  Without guarding or rebound. Extremities/skin: see documentation above   Data Reviewed: I have personally reviewed following labs and imaging studies  CBC: Recent Labs  Lab 05/01/24 2006 05/02/24 0157  WBC 10.0 9.6  NEUTROABS 7.1  --   HGB 13.7 13.1  HCT 44.8 42.5  MCV 82.2 81.3  PLT 223 218   Basic Metabolic Panel: Recent Labs  Lab 05/01/24 2006 05/02/24 0157  NA 138 138  K 4.0 3.7  CL 101 102  CO2 28 26  GLUCOSE 79 159*  BUN 16 15  CREATININE 1.02 1.13  CALCIUM  8.8* 8.3*   GFR: Estimated Creatinine Clearance: 167.2 mL/min (by C-G formula based on SCr  of 1.13 mg/dL).  CBG: Recent Labs  Lab 05/01/24 2017 05/01/24 2218 05/01/24 2245  GLUCAP 74 64* 102*   Sepsis Labs: Recent Labs  Lab 05/01/24 2012  LATICACIDVEN 1.9   No results found for this or any previous visit (from the past 240 hours).   Radiology Studies: No results found.  Scheduled Meds:  diltiazem   120 mg Oral Daily   DULoxetine   40 mg Oral Daily   insulin  aspart  0-20 Units Subcutaneous TID WC   insulin  aspart  3 Units Subcutaneous TID WC   levothyroxine   100 mcg Oral Daily   nortriptyline   75 mg Oral BID   polyethylene glycol  17 g Oral Daily   predniSONE   5 mg Oral Q breakfast   pregabalin   200 mg Oral TID   rivaroxaban   20 mg Oral Q breakfast   rosuvastatin   20 mg Oral QHS   Continuous Infusions:  cefTRIAXone  (ROCEPHIN )  IV       LOS: 0 days   Time spent:  Elsie JAYSON Montclair, DO [facility]  If 7PM-7AM, please contact night-coverage [email or web address]  05/02/2024, 7:46 AM

## 2024-05-02 NOTE — Progress Notes (Signed)
 Wound care to R leg completed, patient tolerated well, bariatric bed not received, pending delivery, bottom rail of bed removed for patient  comfort, maintain legs elevated.

## 2024-05-02 NOTE — Consult Note (Signed)
 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 Apply Xeroform gauze to open wound beds daily, cover with ABD pad and secure with Kerlix roll gauze beginning right above toes and ending right below knees.  Secure with Ace bandage wrapped in same fashion as Kerlix for light compression.  Elevate legs as much as possible.    POC discussed with bedside nurse. WOC team will not follow. Reconsult if further needs arise.   Thank you,    Powell Bar MSN, RN-BC, [employer]

## 2024-05-02 NOTE — Inpatient Diabetes Management (Signed)
 Inpatient Diabetes Program Recommendations  AACE/ADA: New Consensus Statement on Inpatient Glycemic Control (2015)  Target Ranges:  Prepandial:   less than 140 mg/dL      Peak postprandial:   less than 180 mg/dL (1-2 hours)      Critically ill patients:  140 - 180 mg/dL   Lab Results  Component Value Date   GLUCAP 128 (H) 05/02/2024   HGBA1C 8.0 (H) 08/04/2022    Review of Glycemic Control  Latest Reference Range & Units 05/02/24 07:59  Glucose-Capillary 70 - 99 mg/dL 871 (H)  (H): Data is abnormally high Diabetes history: Type 2 DM Outpatient Diabetes medications: U500 100B/ 100 L/ 90 D, Mounjaro 10 mg QD Current orders for Inpatient glycemic control: Novolog  0-20 units TID, Novolog  3 units TID Prednisone  5 mg every day  A1C in process  Inpatient Diabetes Program Recommendations:    Noted consult. Dietitian has met with patient. Patient followed by Dr Beryl, outpatient endocrinology.   Would consider: -Changing diet to carb modified   As far as discharge recommendations DM team will follow during hospitalization.   Thanks, Tinnie Minus, MSN, RNC-OB Diabetes Coordinator [phone] (8a-5p)

## 2024-05-03 DIAGNOSIS — L03115 Cellulitis of right lower limb: Secondary | ICD-10-CM | POA: Diagnosis not present

## 2024-05-03 LAB — HEMOGLOBIN A1C
Hgb A1c MFr Bld: 9.1 % — ABNORMAL HIGH (ref 4.8–5.6)
Hgb A1c MFr Bld: 9.4 % — ABNORMAL HIGH (ref 4.8–5.6)
Mean Plasma Glucose: 214 mg/dL
Mean Plasma Glucose: 223 mg/dL

## 2024-05-03 LAB — GLUCOSE, CAPILLARY
Glucose-Capillary: 218 mg/dL — ABNORMAL HIGH (ref 70–99)
Glucose-Capillary: 243 mg/dL — ABNORMAL HIGH (ref 70–99)

## 2024-05-03 NOTE — TOC Progression Note (Signed)
 Transition of Care [facility]) - Progression Note    Patient Details  Name: Carl Hill MRN: [id number] Date of Birth: [birth year]  [facility]) CM/SW Contact  Rosaline JONELLE Joe, RN Phone Number: 05/03/2024, 4:05 PM  Clinical Narrative:    Bedside RN, Lavonda, states that the patient would like to have paperwork completed pertaining to Health Care Power of [facility] - chaplain consult order placed.                     Expected Discharge Plan and Services                                               Social Drivers of Health (SDOH) Interventions SDOH Screenings   Food Insecurity: No Food Insecurity (05/01/2024)  Housing: Low Risk  (05/01/2024)  Transportation Needs: No Transportation Needs (05/01/2024)  Utilities: Not At Risk (05/01/2024)  Depression (PHQ2-9): Low Risk  (10/17/2020)  Financial Resource Strain: Patient Declined (04/23/2024)   Received from [facility]  Physical Activity: Unknown (04/23/2024)   Received from [facility]  Social Connections: Moderately Integrated (04/23/2024)   Received from [facility]  Stress: Patient Declined (04/23/2024)   Received from [facility]  Tobacco Use: High Risk (05/02/2024)    Readmission Risk Interventions     No data to display

## 2024-05-03 NOTE — Progress Notes (Signed)
 PROGRESS NOTE    Carl Hill  FMW:[id number] DOB: 1970/08/06 DOA: 05/01/2024 PCP: Emilio Joesph DEL, PA-C   Brief Narrative:  Carl Hill is a 53 y.o. male with a PMH significant for obesity, atrial fibrillation, insulin -dependent diabetes, HTN, HLD, hypothyroidism, chronic venous stasis, adrenal insufficiency. At baseline, is able to ambulate short distances independently. Patient presented from home with lower extremity wounds appear to be worsening over the past week, noted minor skin tear previously a month ago with worsening surrounding erythema pain and swelling. Patient admitted for further management.  Assessment & Plan:   Principal Problem:   Cellulitis  RLE Cellulitis - does not meet sepsis criteria, POA Immunosuppressed on chronic steroids Currently afebrile with no leukocytosis BC X 2 NGTD Continue IV antibiotics, given history broad-spectrum antibiotics with ceftriaxone  and vancomycin  for now Consult wound care nurse, patient would benefit from home health wound care   Insulin -dependent type 2 diabetes mellitus, uncontrolled Repeat A1c 9.1 Continue sliding scale insulin , hypoglycemic protocol, diabetic diet   Severe peripheral neuropathy - Continue home duloxetine , nortriptyline  Hypothyroidism - Continue home levothyroxine  Paroxysmal Atrial fibrillation - Continue home Cardizem , Xarelto  HLD - Continue home rosuvastatin  DVT prophylaxis: rivaroxaban  (XARELTO ) tablet 20 mg Start: 05/02/24 0700 rivaroxaban  (XARELTO ) tablet 20 mg   Code Status:   Code Status: Full Code  Family Communication: None present  Status is: Inpatient  Dispo: The patient is from: Home              Anticipated d/c is to: Home              Anticipated d/c date is: 24 to 48 hours              Patient currently not medically stable for discharge  Consultants:  None  Procedures:  None  Antimicrobials:  Ceftriaxone , vancomycin  12/2 -ongoing  Subjective: Denies any new  complaints  Objective: Vitals:   05/03/24 0010 05/03/24 0618 05/03/24 0829 05/03/24 1617  BP: (!) 145/94 (!) 142/91 136/79 138/89  Pulse: (!) 101 98 100 (!) 106  Resp: 20 20    Temp: 97.7 F (36.5 C) 97.7 F (36.5 C) 97.7 F (36.5 C)   TempSrc: Oral Oral    SpO2: 95% 95% 97% 98%  Weight:      Height:        Intake/Output Summary (Last 24 hours) at 05/03/2024 1838 Last data filed at 05/03/2024 1700 Gross per 24 hour  Intake 922.74 ml  Output --  Net 922.74 ml   Filed Weights   05/01/24 2338  Weight: (!) 249.5 kg    Examination:  General: NAD, morbidly obese Cardiovascular: S1, S2 present Respiratory: Diminished BS B/l Abdomen: Soft, nontender, nondistended, bowel sounds present Musculoskeletal: No bilateral pedal edema noted Skin: Noted b/l LE venous stasis, with RLE dressing c/d/i Psychiatry: Normal mood    Data Reviewed: I have personally reviewed following labs and imaging studies  CBC: Recent Labs  Lab 05/01/24 2006 05/02/24 0157  WBC 10.0 9.6  NEUTROABS 7.1  --   HGB 13.7 13.1  HCT 44.8 42.5  MCV 82.2 81.3  PLT 223 218   Basic Metabolic Panel: Recent Labs  Lab 05/01/24 2006 05/02/24 0157  NA 138 138  K 4.0 3.7  CL 101 102  CO2 28 26  GLUCOSE 79 159*  BUN 16 15  CREATININE 1.02 1.13  CALCIUM  8.8* 8.3*   GFR: Estimated Creatinine Clearance: 167.2 mL/min (by C-G formula based on SCr of 1.13 mg/dL).  CBG:  Recent Labs  Lab 05/02/24 1221 05/02/24 1630 05/02/24 2116 05/03/24 1142 05/03/24 1614  GLUCAP 177* 213* 245* 218* 243*   Sepsis Labs: Recent Labs  Lab 05/01/24 2012  LATICACIDVEN 1.9   Recent Results (from the past 240 hours)  Culture, blood (Routine X 2) w Reflex to ID Panel     Status: None (Preliminary result)   Collection Time: 05/01/24  9:07 PM   Specimen: BLOOD  Result Value Ref Range Status   Specimen Description BLOOD SITE NOT SPECIFIED  Final   Special Requests   Final    BOTTLES DRAWN AEROBIC AND ANAEROBIC Blood  Culture results may not be optimal due to an inadequate volume of blood received in culture bottles   Culture   Final    NO GROWTH 2 DAYS Performed at [facility], [address], [place], [place] [address]    Report Status PENDING  Incomplete  Culture, blood (Routine X 2) w Reflex to ID Panel     Status: None (Preliminary result)   Collection Time: 05/01/24 11:39 PM   Specimen: BLOOD RIGHT HAND  Result Value Ref Range Status   Specimen Description BLOOD RIGHT HAND  Final   Special Requests   Final    BOTTLES DRAWN AEROBIC AND ANAEROBIC Blood Culture results may not be optimal due to an inadequate volume of blood received in culture bottles   Culture   Final    NO GROWTH 1 DAY Performed at [facility], [address], [place], [place] [address]    Report Status PENDING  Incomplete     Radiology Studies: No results found.  Scheduled Meds:  diltiazem   120 mg Oral Daily   DULoxetine   40 mg Oral Daily   insulin  aspart  0-20 Units Subcutaneous TID WC   insulin  aspart  3 Units Subcutaneous TID WC   levothyroxine   100 mcg Oral Daily   multivitamin with minerals  1 tablet Oral Daily   nortriptyline   75 mg Oral BID   nutrition supplement (JUVEN)  1 packet Oral BID BM   polyethylene glycol  17 g Oral Daily   predniSONE   5 mg Oral Q breakfast   pregabalin   200 mg Oral TID   Ensure Max Protein  11 oz Oral Daily   rivaroxaban   20 mg Oral Q breakfast   rosuvastatin  20 mg Oral QHS   Continuous Infusions:  cefTRIAXone  (ROCEPHIN )  IV 2 g (05/03/24 1028)   vancomycin  1,500 mg (05/03/24 1530)     LOS: 1 day   Time spent:  Carl JINNY Cage, MD [facility]  If 7PM-7AM, please contact night-coverage [email or web address]  05/03/2024, 6:38 PM

## 2024-05-03 NOTE — Progress Notes (Signed)
 Transition of Care [facility]) - Inpatient Brief Assessment   Patient Details  Name: KAHLEB MCCLANE MRN: [id number] Date of Birth: [birth year]  Transition of Care [facility]) CM/SW Contact:    Rosaline JONELLE Joe, RN Phone Number: 05/03/2024, 4:54 PM   Clinical Narrative: CM met with the patient at the bedside to discuss IP Care management needs.  The patient admitted with diabetes and infection of right lower extremity.  Patient is currently receiving IV antibiotics.  Patient lives alone until his mother returns from STR in the area.  Patient states that his father and aunt have been assisting him with right lower leg dressing changes and have been ordering dressing supplies from [employer].  DME at the home includes Ramp, RW, raised toilet seat, Cane, and home oxygen at 2L.min [place] - at night.  Patient is morbidly obese and will require PTAR to home when stable.  Order placed for Chaplain consult to assist with HCPOA paperwork.  Patient was provided Medicare choice regarding home health and patient prefers [facility] since he was recently active with them.  [facility] accepted for [facility] services for RN.  HH order placed.   Transition of Care Asessment: Insurance and Status: (P) Insurance coverage has been reviewed Patient has primary care physician: (P) Yes Home environment has been reviewed: (P) from home alone Prior level of function:: (P) self Prior/Current Home Services: (P) No current home services Social Drivers of Health Review: (P) SDOH reviewed interventions complete Readmission risk has been reviewed: (P) Yes Transition of care needs: (P) transition of care needs identified, TOC will continue to follow

## 2024-05-03 NOTE — Plan of Care (Signed)

## 2024-05-04 DIAGNOSIS — L03115 Cellulitis of right lower limb: Secondary | ICD-10-CM | POA: Diagnosis not present

## 2024-05-04 LAB — GLUCOSE, CAPILLARY
Glucose-Capillary: 249 mg/dL — ABNORMAL HIGH (ref 70–99)
Glucose-Capillary: 224 mg/dL — ABNORMAL HIGH (ref 70–99)
Glucose-Capillary: 195 mg/dL — ABNORMAL HIGH (ref 70–99)

## 2024-05-04 LAB — CBC WITH DIFFERENTIAL/PLATELET
Abs Immature Granulocytes: 0.02 K/uL (ref 0.00–0.07)
Basophils Absolute: 0.1 K/uL (ref 0.0–0.1)
Basophils Relative: 1 %
Eosinophils Absolute: 0.2 K/uL (ref 0.0–0.5)
Eosinophils Relative: 2 %
HCT: 40.2 % (ref 39.0–52.0)
Hemoglobin: 12.4 g/dL — ABNORMAL LOW (ref 13.0–17.0)
Immature Granulocytes: 0 %
Lymphocytes Relative: 37 %
Lymphs Abs: 2.8 K/uL (ref 0.7–4.0)
MCH: 25.4 pg — ABNORMAL LOW (ref 26.0–34.0)
MCHC: 30.8 g/dL (ref 30.0–36.0)
MCV: 82.4 fL (ref 80.0–100.0)
Monocytes Absolute: 0.5 K/uL (ref 0.1–1.0)
Monocytes Relative: 7 %
Neutro Abs: 4 K/uL (ref 1.7–7.7)
Neutrophils Relative %: 53 %
Platelets: 175 K/uL (ref 150–400)
RBC: 4.88 MIL/uL (ref 4.22–5.81)
RDW: 17.3 % — ABNORMAL HIGH (ref 11.5–15.5)
WBC: 7.5 K/uL (ref 4.0–10.5)
nRBC: 0 % (ref 0.0–0.2)

## 2024-05-04 LAB — BASIC METABOLIC PANEL WITH GFR
Anion gap: 9 (ref 5–15)
BUN: 15 mg/dL (ref 6–20)
CO2: 28 mmol/L (ref 22–32)
Calcium: 8.1 mg/dL — ABNORMAL LOW (ref 8.9–10.3)
Chloride: 101 mmol/L (ref 98–111)
Creatinine, Ser: 0.88 mg/dL (ref 0.61–1.24)
GFR, Estimated: >60 mL/min (ref >60)
Glucose, Bld: 225 mg/dL — ABNORMAL HIGH (ref 70–99)
Potassium: 3.9 mmol/L (ref 3.5–5.1)
Sodium: 138 mmol/L (ref 135–145)

## 2024-05-04 LAB — VANCOMYCIN, PEAK: Vancomycin Pk: 26 ug/mL — ABNORMAL LOW (ref 30–40)

## 2024-05-04 LAB — VANCOMYCIN, TROUGH: Vancomycin Tr: 13 ug/mL — ABNORMAL LOW (ref 15–20)

## 2024-05-04 MED ORDER — PANTOPRAZOLE SODIUM 40 MG PO TBEC
40.0000 mg | DELAYED_RELEASE_TABLET | Freq: Every day | ORAL | Status: DC
  Administered 2024-05-04 – 2024-05-07 (×4): 40 mg via ORAL
  Filled 2024-05-04 (×4): qty 1

## 2024-05-04 MED ORDER — GERHARDT'S BUTT CREAM
TOPICAL_CREAM | Freq: Three times a day (TID) | CUTANEOUS | Status: DC
  Filled 2024-05-04: qty 60

## 2024-05-04 MED ORDER — SENNOSIDES-DOCUSATE SODIUM 8.6-50 MG PO TABS
1.0000 | ORAL_TABLET | Freq: Two times a day (BID) | ORAL | Status: DC
  Administered 2024-05-04 – 2024-05-07 (×3): 1 via ORAL
  Filled 2024-05-04 (×3): qty 1

## 2024-05-04 MED ORDER — POLYETHYLENE GLYCOL 3350 17 G PO PACK
17.0000 g | PACK | Freq: Two times a day (BID) | ORAL | Status: DC
  Administered 2024-05-04 – 2024-05-07 (×2): 17 g via ORAL
  Filled 2024-05-04 (×3): qty 1

## 2024-05-04 MED ORDER — INSULIN GLARGINE-YFGN 100 UNIT/ML ~~LOC~~ SOLN
10.0000 [IU] | Freq: Every day | SUBCUTANEOUS | Status: DC
  Administered 2024-05-04 – 2024-05-05 (×2): 10 [IU] via SUBCUTANEOUS
  Filled 2024-05-04 (×2): qty 0.1

## 2024-05-04 MED ORDER — INSULIN GLARGINE-YFGN 100 UNIT/ML ~~LOC~~ SOPN: 10.0000 [IU] | PEN_INJECTOR | SUBCUTANEOUS | Status: DC

## 2024-05-04 NOTE — Plan of Care (Signed)
   Problem: Education: Goal: Ability to describe self-care measures that may prevent or decrease complications (Diabetes Survival Skills Education) will improve Outcome: Progressing Goal: Individualized Educational Video(s) Outcome: Progressing

## 2024-05-04 NOTE — Progress Notes (Signed)
 Pharmacy Antibiotic Note  Carl Hill is a 53 y.o. male admitted on 05/01/2024 with cellulitis.  Pharmacy has been consulted for vancomycin  dosing.  Vanc pk 26 mcg/ml, Vanc trough 13 mcg/ml Calculated AUC 469 (therapeutic)  Plan: Continue Vancomycin  1500 mg IV Q8h  Trend WBC, fever, renal function F/u cultures, clinical progress, levels as indicated De-escalate when able   Height: 6' 6 (198.1 cm) Weight: (!) 249.5 kg (550 lb) IBW/kg (Calculated) : 91.4  Temp (24hrs), Avg:97.9 F (36.6 C), Min:97 F (36.1 C), Max:98.2 F (36.8 C)  Recent Labs  Lab 05/01/24 2006 05/01/24 2012 05/02/24 0157 05/04/24 0431 05/04/24 1803 05/04/24 2101  WBC 10.0  --  9.6 7.5  --   --   CREATININE 1.02  --  1.13 0.88  --   --   LATICACIDVEN  --  1.9  --   --   --   --   VANCOTROUGH  --   --   --   --   --  13*  VANCOPEAK  --   --   --   --  26*  --     Estimated Creatinine Clearance: 214.7 mL/min (by C-G formula based on SCr of 0.88 mg/dL).    Allergies  Allergen Reactions   Bee Venom Anaphylaxis    Other reaction(s): Unknown Other reaction(s): Unknown Other reaction(s): Unknown    Sglt2 Inhibitors Other (See Comments)    Necrotizing infection of the perineum   Other Hives    Thank you for allowing pharmacy to be a part of this patient's care.  Shelba Collier, PharmD, BCPS Clinical Pharmacist

## 2024-05-04 NOTE — Progress Notes (Signed)
    Durable Medical Equipment  (From admission, onward)           Start     Ordered   05/04/24 1551  For home use only DME Hospital bed  Once       Question Answer Comment  Length of Need 12 Months   Patient has (list medical condition): LLE extremity cellulitis, Obesity, atrial fibrillation, chronic venous stasis, oxygen dependent   The above medical condition requires: Patient requires the ability to reposition frequently   Head must be elevated greater than: 30 degrees   Bed type Semi-electric   Support Surface: Gel Overlay      05/04/24 1555

## 2024-05-04 NOTE — Progress Notes (Signed)
 This chaplain responded to CM-Michelle page for creating HCPOA. This chaplain delivered AD education to the Pt. The Pt. confirmed his interest in completing HCPOA and requests F/U on Friday.  This chaplain is available for F/U spiritual care as needed.  Chaplain Leeroy Hummer [phone]

## 2024-05-04 NOTE — Progress Notes (Addendum)
 PROGRESS NOTE    Carl Hill  FMW:[id number] DOB: 07/12/1970 DOA: 05/01/2024 PCP: Emilio Joesph DEL, PA-C   Brief Narrative:  Carl Hill is a 53 y.o. male with a PMH significant for obesity, atrial fibrillation, insulin -dependent diabetes, HTN, HLD, hypothyroidism, chronic venous stasis, adrenal insufficiency. At baseline, is able to ambulate short distances independently. Patient presented from home with lower extremity wounds appear to be worsening over the past week, noted minor skin tear previously a month ago with worsening surrounding erythema pain and swelling. Patient admitted for further management.    Assessment & Plan:   Principal Problem:   Cellulitis  RLE Cellulitis - does not meet sepsis criteria, POA Immunosuppressed on chronic steroids Currently afebrile with no leukocytosis BC X 2 NGTD Continue IV antibiotics, given history, broad-spectrum antibiotics with ceftriaxone  and vancomycin  for now Consult wound care nurse, patient would benefit from home health wound care   Insulin -dependent type 2 diabetes mellitus, uncontrolled Repeat A1c 9.1 Continue sliding scale insulin , glargine, hypoglycemic protocol, diabetic diet   Severe peripheral neuropathy - Continue home duloxetine , nortriptyline  Hypothyroidism - Continue home levothyroxine  Paroxysmal Atrial fibrillation - Continue home Cardizem , Xarelto  HLD - Continue home rosuvastatin   DVT prophylaxis: rivaroxaban  (XARELTO ) tablet 20 mg Start: 05/02/24 0700 rivaroxaban  (XARELTO ) tablet 20 mg   Code Status:   Code Status: Full Code  Family Communication: None present  Status is: Inpatient  Dispo: The patient is from: Home              Anticipated d/c is to: Home              Anticipated d/c date is: 24 to 48 hours              Patient currently not medically stable for discharge  Consultants:  None  Procedures:  None  Antimicrobials:  Ceftriaxone , vancomycin  12/2 -ongoing  Subjective: Denies  any new complaints, takes daily pictures of his RLE cellulitis, slow improvement, noted new skin tear.  Request bariatric bed    Objective: Vitals:   05/04/24 0504 05/04/24 0812 05/04/24 1211 05/04/24 1703  BP: (!) 143/84 134/85 (!) 143/87 102/84  Pulse: 95 97 (!) 107 (!) 105  Resp: 18 18 18 20   Temp: 97.6 F (36.4 C) 98 F (36.7 C) 98.1 F (36.7 C) (!) 97 F (36.1 C)  TempSrc: Oral     SpO2: 94% 97% 98% 95%  Weight:      Height:       No intake or output data in the 24 hours ending 05/04/24 1717  Filed Weights   05/01/24 2338  Weight: (!) 249.5 kg    Examination:  General: NAD, morbidly obese Cardiovascular: S1, S2 present Respiratory: Diminished BS B/l Abdomen: Soft, nontender, nondistended, bowel sounds present Musculoskeletal: No bilateral pedal edema noted Skin: Noted b/l LE venous stasis, with RLE dressing c/d/i Psychiatry: Normal mood    Data Reviewed: I have personally reviewed following labs and imaging studies  CBC: Recent Labs  Lab 05/01/24 2006 05/02/24 0157 05/04/24 0431  WBC 10.0 9.6 7.5  NEUTROABS 7.1  --  4.0  HGB 13.7 13.1 12.4*  HCT 44.8 42.5 40.2  MCV 82.2 81.3 82.4  PLT 223 218 175   Basic Metabolic Panel: Recent Labs  Lab 05/01/24 2006 05/02/24 0157 05/04/24 0431  NA 138 138 138  K 4.0 3.7 3.9  CL 101 102 101  CO2 28 26 28   GLUCOSE 79 159* 225*  BUN 16 15 15   CREATININE 1.02 1.13  0.88  CALCIUM  8.8* 8.3* 8.1*   GFR: Estimated Creatinine Clearance: 214.7 mL/min (by C-G formula based on SCr of 0.88 mg/dL).  CBG: Recent Labs  Lab 05/02/24 2116 05/03/24 1142 05/03/24 1614 05/04/24 1208 05/04/24 1702  GLUCAP 245* 218* 243* 249* 224*   Sepsis Labs: Recent Labs  Lab 05/01/24 2012  LATICACIDVEN 1.9   Recent Results (from the past 240 hours)  Culture, blood (Routine X 2) w Reflex to ID Panel     Status: None (Preliminary result)   Collection Time: 05/01/24  9:07 PM   Specimen: BLOOD  Result Value Ref Range Status    Specimen Description BLOOD SITE NOT SPECIFIED  Final   Special Requests   Final    BOTTLES DRAWN AEROBIC AND ANAEROBIC Blood Culture results may not be optimal due to an inadequate volume of blood received in culture bottles   Culture   Final    NO GROWTH 3 DAYS Performed at [facility], [address] [place], [place] [address]    Report Status PENDING  Incomplete  Culture, blood (Routine X 2) w Reflex to ID Panel     Status: None (Preliminary result)   Collection Time: 05/01/24 11:39 PM   Specimen: BLOOD RIGHT HAND  Result Value Ref Range Status   Specimen Description BLOOD RIGHT HAND  Final   Special Requests   Final    BOTTLES DRAWN AEROBIC AND ANAEROBIC Blood Culture results may not be optimal due to an inadequate volume of blood received in culture bottles   Culture   Final    NO GROWTH 2 DAYS Performed at [facility], [address], [place], [place] [address]    Report Status PENDING  Incomplete     Radiology Studies: No results found.  Scheduled Meds:  diltiazem   120 mg Oral Daily   DULoxetine   40 mg Oral Daily   Gerhardt's butt cream   Topical TID   insulin  aspart  0-20 Units Subcutaneous TID WC   insulin  aspart  3 Units Subcutaneous TID WC   insulin  glargine-yfgn  10 Units Subcutaneous Daily   levothyroxine   100 mcg Oral Daily   multivitamin with minerals  1 tablet Oral Daily   nortriptyline   75 mg Oral BID   nutrition supplement (JUVEN)  1 packet Oral BID BM   polyethylene glycol  17 g Oral Daily   predniSONE   5 mg Oral Q breakfast   pregabalin   200 mg Oral TID   Ensure Max Protein  11 oz Oral Daily   rivaroxaban   20 mg Oral Q breakfast   rosuvastatin  20 mg Oral QHS   Continuous Infusions:  cefTRIAXone  (ROCEPHIN )  IV 2 g (05/04/24 1050)   vancomycin  1,500 mg (05/04/24 1504)     LOS: 2 days     Carl JINNY Cage, MD [facility]  If 7PM-7AM, please contact night-coverage [email or web address]  05/04/2024, 5:17 PM

## 2024-05-04 NOTE — TOC Progression Note (Signed)
 Transition of Care [facility]) - Progression Note    Patient Details  Name: Carl Hill MRN: [id number] Date of Birth: June 24, 1970  Transition of Care [facility]) CM/SW Contact  Rosaline JONELLE Joe, RN Phone Number: 05/04/2024, 4:07 PM  Clinical Narrative:    Cm spoke with the patient at the bedside and he is requesting a bariatric bed for home.  DME order and DME note placed to be co-signed by the MD.    Patient was offered Medicare choice regarding DMe company and he did not have a preference.  [employer] was called to order and deliver the bariatric bed to the home.  The patient states that he is able to discharge home prior to delivery of the bed to the home.  I called Chaplain services and asked that they follow up in the am to [facility] paperwork.  Patient will need PTAR arranged prior to discharge to home.                     Expected Discharge Plan and Services                                               Social Drivers of Health (SDOH) Interventions SDOH Screenings   Food Insecurity: No Food Insecurity (05/01/2024)  Housing: Low Risk  (05/01/2024)  Transportation Needs: No Transportation Needs (05/01/2024)  Utilities: Not At Risk (05/01/2024)  Depression (PHQ2-9): Low Risk  (10/17/2020)  Financial Resource Strain: Patient Declined (04/23/2024)   Received from [facility]  Physical Activity: Unknown (04/23/2024)   Received from [facility]  Social Connections: Moderately Integrated (04/23/2024)   Received from [facility]  Stress: Patient Declined (04/23/2024)   Received from [facility]  Tobacco Use: High Risk (05/02/2024)    Readmission Risk Interventions    05/03/2024    4:53 PM  Readmission Risk Prevention Plan  Post Dischage Appt Complete  Medication Screening Complete  Transportation Screening Complete

## 2024-05-05 DIAGNOSIS — L03115 Cellulitis of right lower limb: Secondary | ICD-10-CM | POA: Diagnosis not present

## 2024-05-05 LAB — BASIC METABOLIC PANEL WITH GFR
Anion gap: 8 (ref 5–15)
BUN: 17 mg/dL (ref 6–20)
CO2: 28 mmol/L (ref 22–32)
Calcium: 8.2 mg/dL — ABNORMAL LOW (ref 8.9–10.3)
Chloride: 103 mmol/L (ref 98–111)
Creatinine, Ser: 0.96 mg/dL (ref 0.61–1.24)
GFR, Estimated: >60 mL/min (ref >60)
Glucose, Bld: 221 mg/dL — ABNORMAL HIGH (ref 70–99)
Potassium: 3.8 mmol/L (ref 3.5–5.1)
Sodium: 139 mmol/L (ref 135–145)

## 2024-05-05 LAB — GLUCOSE, CAPILLARY
Glucose-Capillary: 230 mg/dL — ABNORMAL HIGH (ref 70–99)
Glucose-Capillary: 290 mg/dL — ABNORMAL HIGH (ref 70–99)
Glucose-Capillary: 225 mg/dL — ABNORMAL HIGH (ref 70–99)
Glucose-Capillary: 201 mg/dL — ABNORMAL HIGH (ref 70–99)

## 2024-05-05 MED ORDER — INSULIN GLARGINE 100 UNIT/ML ~~LOC~~ SOLN
25.0000 [IU] | Freq: Every day | SUBCUTANEOUS | Status: DC
  Administered 2024-05-06: 25 [IU] via SUBCUTANEOUS
  Filled 2024-05-05: qty 0.25

## 2024-05-05 MED ORDER — INSULIN GLARGINE-YFGN 100 UNIT/ML ~~LOC~~ SOPN: 10.0000 [IU] | PEN_INJECTOR | Freq: Once | SUBCUTANEOUS | Status: DC

## 2024-05-05 MED ORDER — INSULIN GLARGINE-YFGN 100 UNIT/ML ~~LOC~~ SOLN: 25.0000 [IU] | Freq: Every day | SUBCUTANEOUS | Status: DC

## 2024-05-05 MED ORDER — INSULIN ASPART 100 UNIT/ML IJ SOLN
6.0000 [IU] | Freq: Three times a day (TID) | INTRAMUSCULAR | Status: DC
  Administered 2024-05-05 – 2024-05-07 (×8): 6 [IU] via SUBCUTANEOUS
  Filled 2024-05-05 (×8): qty 6

## 2024-05-05 MED ORDER — INSULIN GLARGINE 100 UNIT/ML ~~LOC~~ SOLN
15.0000 [IU] | Freq: Once | SUBCUTANEOUS | Status: AC
  Administered 2024-05-05: 15 [IU] via SUBCUTANEOUS
  Filled 2024-05-05: qty 0.15

## 2024-05-05 NOTE — Progress Notes (Signed)
 Wound care and dressing change done using aseptic techniques. Patient tolerated it well.

## 2024-05-05 NOTE — Progress Notes (Signed)
 PROGRESS NOTE    Carl Hill  FMW:[id number] DOB: 1970/08/25 DOA: 05/01/2024 PCP: Emilio Joesph DEL, PA-C   Brief Narrative:  Carl Hill is a 53 y.o. male with a PMH significant for obesity, atrial fibrillation, insulin -dependent diabetes, HTN, HLD, hypothyroidism, chronic venous stasis, adrenal insufficiency. At baseline, is able to ambulate short distances independently. Patient presented from home with lower extremity wounds appear to be worsening over the past week, noted minor skin tear previously a month ago with worsening surrounding erythema pain and swelling. Patient admitted for further management.    Assessment & Plan:   Principal Problem:   Cellulitis  RLE Cellulitis - does not meet sepsis criteria, POA Immunosuppressed on chronic steroids with uncontrolled DM Currently afebrile with no leukocytosis BC X 2 NGTD Discussed with ID on 12/5, continue current management, elevate legs Continue IV antibiotics, given history, broad-spectrum antibiotics with ceftriaxone  and vancomycin  for now Consult wound care nurse, patient would benefit from home health wound care   Insulin -dependent type 2 diabetes mellitus, uncontrolled Repeat A1c 9.1 Continue sliding scale insulin , glargine, hypoglycemic protocol, diabetic diet   Severe peripheral neuropathy - Continue home duloxetine , nortriptyline  Hypothyroidism - Continue home levothyroxine  Paroxysmal Atrial fibrillation - Continue home Cardizem , Xarelto  HLD - Continue home rosuvastatin   DVT prophylaxis: rivaroxaban  (XARELTO ) tablet 20 mg Start: 05/02/24 0700 rivaroxaban  (XARELTO ) tablet 20 mg   Code Status:   Code Status: Full Code  Family Communication: None present  Status is: Inpatient  Dispo: The patient is from: Home              Anticipated d/c is to: Home              Anticipated d/c date is: 24 to 48 hours              Patient currently not medically stable for discharge  Consultants:   None  Procedures:  None  Antimicrobials:  Ceftriaxone , vancomycin  12/2 -ongoing  Subjective: No new complaints, cellulitis with slow improvement.     Objective: Vitals:   05/04/24 2032 05/05/24 0000 05/05/24 0505 05/05/24 0809  BP: (!) 146/99 134/73 (!) 160/84 (!) 138/94  Pulse: (!) 108 (!) 102 100 99  Resp: 18 18 18    Temp: 98.2 F (36.8 C) 98.3 F (36.8 C) 97.6 F (36.4 C) 97.9 F (36.6 C)  TempSrc: Oral Oral Oral   SpO2: 97% 96% 93% 96%  Weight:      Height:        Intake/Output Summary (Last 24 hours) at 05/05/2024 1429 Last data filed at 05/04/2024 1955 Gross per 24 hour  Intake 100 ml  Output --  Net 100 ml    Filed Weights   05/01/24 2338  Weight: (!) 249.5 kg    Examination:  General: NAD, morbidly obese Cardiovascular: S1, S2 present Respiratory: Diminished BS B/l Abdomen: Soft, nontender, nondistended, bowel sounds present Musculoskeletal: No bilateral pedal edema noted Skin: Noted b/l LE venous stasis, with RLE dressing c/d/I (picture in media) Psychiatry: Normal mood    Data Reviewed: I have personally reviewed following labs and imaging studies  CBC: Recent Labs  Lab 05/01/24 2006 05/02/24 0157 05/04/24 0431  WBC 10.0 9.6 7.5  NEUTROABS 7.1  --  4.0  HGB 13.7 13.1 12.4*  HCT 44.8 42.5 40.2  MCV 82.2 81.3 82.4  PLT 223 218 175   Basic Metabolic Panel: Recent Labs  Lab 05/01/24 2006 05/02/24 0157 05/04/24 0431 05/05/24 0533  NA 138 138 138 139  K 4.0  3.7 3.9 3.8  CL 101 102 101 103  CO2 28 26 28 28   GLUCOSE 79 159* 225* 221*  BUN 16 15 15 17   CREATININE 1.02 1.13 0.88 0.96  CALCIUM  8.8* 8.3* 8.1* 8.2*   GFR: Estimated Creatinine Clearance: 196.8 mL/min (by C-G formula based on SCr of 0.96 mg/dL).  CBG: Recent Labs  Lab 05/04/24 1208 05/04/24 1702 05/04/24 2002 05/05/24 0804 05/05/24 1140  GLUCAP 249* 224* 195* 230* 290*   Sepsis Labs: Recent Labs  Lab 05/01/24 2012  LATICACIDVEN 1.9   Recent Results  (from the past 240 hours)  Culture, blood (Routine X 2) w Reflex to ID Panel     Status: None (Preliminary result)   Collection Time: 05/01/24  9:07 PM   Specimen: BLOOD  Result Value Ref Range Status   Specimen Description BLOOD SITE NOT SPECIFIED  Final   Special Requests   Final    BOTTLES DRAWN AEROBIC AND ANAEROBIC Blood Culture results may not be optimal due to an inadequate volume of blood received in culture bottles   Culture   Final    NO GROWTH 4 DAYS Performed at [facility], [address], [place], [place] [address]    Report Status PENDING  Incomplete  Culture, blood (Routine X 2) w Reflex to ID Panel     Status: None (Preliminary result)   Collection Time: 05/01/24 11:39 PM   Specimen: BLOOD RIGHT HAND  Result Value Ref Range Status   Specimen Description BLOOD RIGHT HAND  Final   Special Requests   Final    BOTTLES DRAWN AEROBIC AND ANAEROBIC Blood Culture results may not be optimal due to an inadequate volume of blood received in culture bottles   Culture   Final    NO GROWTH 3 DAYS Performed at [facility], [address] [place], [place] [address]    Report Status PENDING  Incomplete     Radiology Studies: No results found.  Scheduled Meds:  diltiazem   120 mg Oral Daily   DULoxetine   40 mg Oral Daily   Gerhardt's butt cream   Topical TID   insulin  aspart  0-20 Units Subcutaneous TID WC   insulin  aspart  6 Units Subcutaneous TID WC   [START ON 05/06/2024] insulin  glargine  25 Units Subcutaneous Daily   levothyroxine   100 mcg Oral Daily   multivitamin with minerals  1 tablet Oral Daily   nortriptyline   75 mg Oral BID   nutrition supplement (JUVEN)  1 packet Oral BID BM   pantoprazole   40 mg Oral Daily   polyethylene glycol  17 g Oral BID   predniSONE   5 mg Oral Q breakfast   pregabalin   200 mg Oral TID   Ensure Max Protein  11 oz Oral Daily   rivaroxaban   20 mg Oral Q breakfast   rosuvastatin   20 mg Oral QHS   senna-docusate  1 tablet  Oral BID   Continuous Infusions:  cefTRIAXone  (ROCEPHIN )  IV 2 g (05/05/24 0923)   vancomycin  1,500 mg (05/05/24 0545)     LOS: 3 days     Lebron JINNY Cage, MD [facility]  If 7PM-7AM, please contact night-coverage [email or web address]  05/05/2024, 2:29 PM

## 2024-05-05 NOTE — Progress Notes (Signed)
 Spiritual care team followed up with pt Carl Hill in regards to advance directive paperwork, however he was asleep at the time of the visit.   Please reach out to chaplains as Jasier expresses the need, and know that we remain available.

## 2024-05-05 NOTE — TOC Progression Note (Addendum)
 Transition of Care [facility]) - Progression Note    Patient Details  Name: Carl Hill MRN: [id number] Date of Birth: 1970/11/16  Transition of [facility] CM/SW Contact  Rosaline JONELLE Joe, RN Phone Number: 05/05/2024, 2:05 PM  Clinical Narrative:    CM met with the patient at the bedside and he states that his brother, Skylan Lara is visiting from out of town and is available at the home to receive the bariatric bed delivery.  I called [employer] and requested that he reach out to Ladainian Therien, brother - [phone] about delivery of the bed to the home over the weekend if possible.  Patient needs a specialty bariatric bed due to patient's weight.  CM spoke with the MD and IV antibiotics to continue for cellulitis in right lower leg.  Patient will likely discharge - possibly Monday and will need PTAR to home.  I called [employer] and asked that he reach out to the patient's brother about delivery of the bed to the home over the weekend if possible.                     Expected Discharge Plan and Services                                               Social Drivers of Health (SDOH) Interventions SDOH Screenings   Food Insecurity: No Food Insecurity (05/01/2024)  Housing: Low Risk  (05/01/2024)  Transportation Needs: No Transportation Needs (05/01/2024)  Utilities: Not At Risk (05/01/2024)  Depression (PHQ2-9): Low Risk  (10/17/2020)  Financial Resource Strain: Patient Declined (04/23/2024)   Received from [facility]  Physical Activity: Unknown (04/23/2024)   Received from [facility]  Social Connections: Moderately Integrated (04/23/2024)   Received from [facility]  Stress: Patient Declined (04/23/2024)   Received from [facility]  Tobacco Use: High Risk (05/02/2024)    Readmission Risk Interventions    05/03/2024    4:53 PM  Readmission Risk Prevention Plan  Post Dischage Appt Complete  Medication Screening Complete  Transportation  Screening Complete

## 2024-05-05 NOTE — Plan of Care (Signed)

## 2024-05-05 NOTE — Inpatient Diabetes Management (Addendum)
 Inpatient Diabetes Program Recommendations  AACE/ADA: New Consensus Statement on Inpatient Glycemic Control (2015)  Target Ranges:  Prepandial:   less than 140 mg/dL      Peak postprandial:   less than 180 mg/dL (1-2 hours)      Critically ill patients:  140 - 180 mg/dL   Lab Results  Component Value Date   GLUCAP 230 (H) 05/05/2024   HGBA1C 9.4 (H) 05/02/2024    Review of Glycemic Control  Latest Reference Range & Units 05/03/24 16:14 05/04/24 12:08 05/04/24 17:02 05/04/24 20:02 05/05/24 08:04  Glucose-Capillary 70 - 99 mg/dL 756 (H) 750 (H) 775 (H) 195 (H) 230 (H)   Diabetes history: DM 2 Outpatient Diabetes medications:  Mounjaro 10 mg weekly Humulin R  100 units bid with breakfast and lunch and 90 units with supper Current orders for Inpatient glycemic control:  Novolog  0-20 units tid with meals Novolog  3 units tid with meals Semglee  10 units daily  Inpatient Diabetes Program Recommendations:    Note patient was on U500 insulin  at home.  Recommend increasing Semglee  to 40 units daily and increase Novolog  meal coverage to 6 units tid with meals.   Addendum 1400- Spoke to patient at bedside.  He states that he is taking his insulin  as ordered (U500 3 times a day).  He wears a Dexcom G7 and shares his blood sugars with his MD and pharmacist.  He states MD has recently increased his U500 and that his blood sugars have improved.  For discharge recommend resuming Home U500 doses as ordered by endocrinology, Dr. Beryl.   Thanks,  Randall Bullocks, RN, BC-ADM Inpatient Diabetes Coordinator Pager [phone]  (8a-5p)

## 2024-05-05 NOTE — Plan of Care (Signed)
 Problem: Education: Goal: Ability to describe self-care measures that may prevent or decrease complications (Diabetes Survival Skills Education) will improve 05/05/2024 1805 by Bradly Devere LABOR, RN Outcome: Progressing 05/05/2024 1755 by Bradly Devere LABOR, RN Outcome: Progressing Goal: Individualized Educational Video(s) 05/05/2024 1805 by Bradly Devere LABOR, RN Outcome: Progressing 05/05/2024 1755 by Bradly Devere LABOR, RN Outcome: Progressing   Problem: Coping: Goal: Ability to adjust to condition or change in health will improve 05/05/2024 1805 by Bradly Devere LABOR, RN Outcome: Progressing 05/05/2024 1755 by Bradly Devere LABOR, RN Outcome: Progressing   Problem: Fluid Volume: Goal: Ability to maintain a balanced intake and output will improve 05/05/2024 1805 by Bradly Devere LABOR, RN Outcome: Progressing 05/05/2024 1755 by Bradly Devere LABOR, RN Outcome: Progressing   Problem: Health Behavior/Discharge Planning: Goal: Ability to identify and utilize available resources and services will improve 05/05/2024 1805 by Bradly Devere LABOR, RN Outcome: Progressing 05/05/2024 1755 by Bradly Devere LABOR, RN Outcome: Progressing Goal: Ability to manage health-related needs will improve 05/05/2024 1805 by Bradly Devere LABOR, RN Outcome: Progressing 05/05/2024 1755 by Bradly Devere LABOR, RN Outcome: Progressing   Problem: Metabolic: Goal: Ability to maintain appropriate glucose levels will improve 05/05/2024 1805 by Bradly Devere LABOR, RN Outcome: Progressing 05/05/2024 1755 by Bradly Devere LABOR, RN Outcome: Progressing   Problem: Nutritional: Goal: Maintenance of adequate nutrition will improve 05/05/2024 1805 by Bradly Devere LABOR, RN Outcome: Progressing 05/05/2024 1755 by Bradly Devere LABOR, RN Outcome: Progressing Goal: Progress toward achieving an optimal weight will improve 05/05/2024 1805 by Bradly Devere LABOR, RN Outcome: Progressing 05/05/2024 1755 by Bradly Devere LABOR, RN Outcome: Progressing   Problem: Skin Integrity: Goal: Risk for impaired skin  integrity will decrease 05/05/2024 1805 by Bradly Devere LABOR, RN Outcome: Progressing 05/05/2024 1755 by Bradly Devere LABOR, RN Outcome: Progressing   Problem: Tissue Perfusion: Goal: Adequacy of tissue perfusion will improve 05/05/2024 1805 by Bradly Devere LABOR, RN Outcome: Progressing 05/05/2024 1755 by Bradly Devere LABOR, RN Outcome: Progressing   Problem: Education: Goal: Knowledge of General Education information will improve Description: Including pain rating scale, medication(s)/side effects and non-pharmacologic comfort measures 05/05/2024 1805 by Bradly Devere LABOR, RN Outcome: Progressing 05/05/2024 1755 by Bradly Devere LABOR, RN Outcome: Progressing   Problem: Health Behavior/Discharge Planning: Goal: Ability to manage health-related needs will improve 05/05/2024 1805 by Bradly Devere LABOR, RN Outcome: Progressing 05/05/2024 1755 by Bradly Devere LABOR, RN Outcome: Progressing   Problem: Clinical Measurements: Goal: Ability to maintain clinical measurements within normal limits will improve 05/05/2024 1805 by Bradly Devere LABOR, RN Outcome: Progressing 05/05/2024 1755 by Bradly Devere LABOR, RN Outcome: Progressing Goal: Will remain free from infection 05/05/2024 1805 by Bradly Devere LABOR, RN Outcome: Progressing 05/05/2024 1755 by Bradly Devere LABOR, RN Outcome: Progressing Goal: Diagnostic test results will improve 05/05/2024 1805 by Bradly Devere LABOR, RN Outcome: Progressing 05/05/2024 1755 by Bradly Devere LABOR, RN Outcome: Progressing Goal: Respiratory complications will improve 05/05/2024 1805 by Bradly Devere LABOR, RN Outcome: Progressing 05/05/2024 1755 by Bradly Devere LABOR, RN Outcome: Progressing Goal: Cardiovascular complication will be avoided 05/05/2024 1805 by Bradly Devere LABOR, RN Outcome: Progressing 05/05/2024 1755 by Bradly Devere LABOR, RN Outcome: Progressing   Problem: Activity: Goal: Risk for activity intolerance will decrease 05/05/2024 1805 by Bradly Devere LABOR, RN Outcome: Progressing 05/05/2024 1755 by Bradly Devere LABOR, RN Outcome:  Progressing   Problem: Nutrition: Goal: Adequate nutrition will be maintained 05/05/2024 1805 by Bradly Devere LABOR, RN Outcome: Progressing 05/05/2024 1755 by Bradly Devere LABOR, RN Outcome: Progressing  Problem: Coping: Goal: Level of anxiety will decrease 05/05/2024 1805 by Bradly Devere LABOR, RN Outcome: Progressing 05/05/2024 1755 by Bradly Devere LABOR, RN Outcome: Progressing   Problem: Elimination: Goal: Will not experience complications related to bowel motility 05/05/2024 1805 by Bradly Devere LABOR, RN Outcome: Progressing 05/05/2024 1755 by Bradly Devere LABOR, RN Outcome: Progressing Goal: Will not experience complications related to urinary retention 05/05/2024 1805 by Bradly Devere LABOR, RN Outcome: Progressing 05/05/2024 1755 by Bradly Devere LABOR, RN Outcome: Progressing   Problem: Pain Managment: Goal: General experience of comfort will improve and/or be controlled 05/05/2024 1805 by Bradly Devere LABOR, RN Outcome: Progressing 05/05/2024 1755 by Bradly Devere LABOR, RN Outcome: Progressing   Problem: Safety: Goal: Ability to remain free from injury will improve 05/05/2024 1805 by Bradly Devere LABOR, RN Outcome: Progressing 05/05/2024 1755 by Bradly Devere LABOR, RN Outcome: Progressing   Problem: Skin Integrity: Goal: Risk for impaired skin integrity will decrease 05/05/2024 1805 by Bradly Devere LABOR, RN Outcome: Progressing 05/05/2024 1755 by Bradly Devere LABOR, RN Outcome: Progressing

## 2024-05-06 DIAGNOSIS — L03115 Cellulitis of right lower limb: Secondary | ICD-10-CM | POA: Diagnosis not present

## 2024-05-06 LAB — BASIC METABOLIC PANEL WITH GFR
Anion gap: 14 (ref 5–15)
BUN: 18 mg/dL (ref 6–20)
CO2: 25 mmol/L (ref 22–32)
Calcium: 8.3 mg/dL — ABNORMAL LOW (ref 8.9–10.3)
Chloride: 99 mmol/L (ref 98–111)
Creatinine, Ser: 1 mg/dL (ref 0.61–1.24)
GFR, Estimated: >60 mL/min (ref >60)
Glucose, Bld: 230 mg/dL — ABNORMAL HIGH (ref 70–99)
Potassium: 4.9 mmol/L (ref 3.5–5.1)
Sodium: 138 mmol/L (ref 135–145)

## 2024-05-06 LAB — CULTURE, BLOOD (ROUTINE X 2): Culture: NO GROWTH

## 2024-05-06 LAB — GLUCOSE, CAPILLARY
Glucose-Capillary: 269 mg/dL — ABNORMAL HIGH (ref 70–99)
Glucose-Capillary: 328 mg/dL — ABNORMAL HIGH (ref 70–99)
Glucose-Capillary: 221 mg/dL — ABNORMAL HIGH (ref 70–99)

## 2024-05-06 MED ORDER — INSULIN GLARGINE 100 UNIT/ML ~~LOC~~ SOLN
10.0000 [IU] | Freq: Once | SUBCUTANEOUS | Status: AC
  Administered 2024-05-06: 10 [IU] via SUBCUTANEOUS
  Filled 2024-05-06: qty 0.1

## 2024-05-06 MED ORDER — INSULIN GLARGINE 100 UNIT/ML ~~LOC~~ SOLN
35.0000 [IU] | Freq: Every day | SUBCUTANEOUS | Status: DC
  Administered 2024-05-07: 35 [IU] via SUBCUTANEOUS
  Filled 2024-05-06: qty 0.35

## 2024-05-06 NOTE — Progress Notes (Signed)
 PROGRESS NOTE    Carl Hill  FMW:[id number] DOB: 1971-05-05 DOA: 05/01/2024 PCP: Emilio Joesph DEL, PA-C   Brief Narrative:  Carl Hill is a 53 y.o. male with a PMH significant for obesity, atrial fibrillation, insulin -dependent diabetes, HTN, HLD, hypothyroidism, chronic venous stasis, adrenal insufficiency. At baseline, is able to ambulate short distances independently. Patient presented from home with lower extremity wounds appear to be worsening over the past week, noted minor skin tear previously a month ago with worsening surrounding erythema pain and swelling. Patient admitted for further management.    Assessment & Plan:   Principal Problem:   Cellulitis   RLE Cellulitis - does not meet sepsis criteria, POA Immunosuppressed on chronic steroids with uncontrolled DM Currently afebrile with no leukocytosis BC X 2 NGTD Discussed with ID on 12/5, continue current management, elevate legs Continue IV antibiotics, given history, broad-spectrum antibiotics with ceftriaxone  and vancomycin  for now Consult wound care nurse, patient would benefit from home health wound care   Insulin -dependent type 2 diabetes mellitus, uncontrolled Repeat A1c 9.1 Continue sliding scale insulin , glargine, hypoglycemic protocol, diabetic diet   Severe peripheral neuropathy - Continue home duloxetine , nortriptyline  Hypothyroidism - Continue home levothyroxine  Paroxysmal Atrial fibrillation - Continue home Cardizem , Xarelto  HLD - Continue home rosuvastatin   DVT prophylaxis: rivaroxaban  (XARELTO ) tablet 20 mg Start: 05/02/24 0700 rivaroxaban  (XARELTO ) tablet 20 mg   Code Status:   Code Status: Full Code  Family Communication: None present  Status is: Inpatient  Dispo: The patient is from: Home              Anticipated d/c is to: Home              Anticipated d/c date is: 24 to 48 hours              Patient currently not medically stable for discharge  Consultants:   None  Procedures:  None  Antimicrobials:  Ceftriaxone , vancomycin  12/2 -ongoing  Subjective: No new complaints. RLE cellulitis slowly improving    Objective: Vitals:   05/06/24 0008 05/06/24 0534 05/06/24 0750 05/06/24 1152  BP: 134/69 123/87 128/75 (!) 148/106  Pulse: 99 100 98 (!) 102  Resp: 18 18 18 18   Temp: 98 F (36.7 C) 97.6 F (36.4 C) 97.7 F (36.5 C) 98 F (36.7 C)  TempSrc: Oral Oral Oral Oral  SpO2: 96% 91% 96% 98%  Weight:      Height:        Intake/Output Summary (Last 24 hours) at 05/06/2024 1438 Last data filed at 05/05/2024 2000 Gross per 24 hour  Intake 103 ml  Output --  Net 103 ml    Filed Weights   05/01/24 2338  Weight: (!) 249.5 kg    Examination:  General: NAD, morbidly obese Cardiovascular: S1, S2 present Respiratory: Diminished BS B/l Abdomen: Soft, nontender, nondistended, bowel sounds present Musculoskeletal: No bilateral pedal edema noted Skin: Noted b/l LE venous stasis, with RLE dressing c/d/I (picture in media) Psychiatry: Normal mood    Data Reviewed: I have personally reviewed following labs and imaging studies  CBC: Recent Labs  Lab 05/01/24 2006 05/02/24 0157 05/04/24 0431  WBC 10.0 9.6 7.5  NEUTROABS 7.1  --  4.0  HGB 13.7 13.1 12.4*  HCT 44.8 42.5 40.2  MCV 82.2 81.3 82.4  PLT 223 218 175   Basic Metabolic Panel: Recent Labs  Lab 05/01/24 2006 05/02/24 0157 05/04/24 0431 05/05/24 0533 05/06/24 0555  NA 138 138 138 139 138  K 4.0  3.7 3.9 3.8 4.9  CL 101 102 101 103 99  CO2 28 26 28 28 25   GLUCOSE 79 159* 225* 221* 230*  BUN 16 15 15 17 18   CREATININE 1.02 1.13 0.88 0.96 1.00  CALCIUM  8.8* 8.3* 8.1* 8.2* 8.3*   GFR: Estimated Creatinine Clearance: 189 mL/min (by C-G formula based on SCr of 1 mg/dL).  CBG: Recent Labs  Lab 05/05/24 1140 05/05/24 1619 05/05/24 2037 05/06/24 0851 05/06/24 1230  GLUCAP 290* 225* 201* 269* 328*   Sepsis Labs: Recent Labs  Lab 05/01/24 2012   LATICACIDVEN 1.9   Recent Results (from the past 240 hours)  Culture, blood (Routine X 2) w Reflex to ID Panel     Status: None   Collection Time: 05/01/24  9:07 PM   Specimen: BLOOD  Result Value Ref Range Status   Specimen Description BLOOD SITE NOT SPECIFIED  Final   Special Requests   Final    BOTTLES DRAWN AEROBIC AND ANAEROBIC Blood Culture results may not be optimal due to an inadequate volume of blood received in culture bottles   Culture   Final    NO GROWTH 5 DAYS Performed at [facility], [address] [place], [place] [address]    Report Status 05/06/2024 FINAL  Final  Culture, blood (Routine X 2) w Reflex to ID Panel     Status: None (Preliminary result)   Collection Time: 05/01/24 11:39 PM   Specimen: BLOOD RIGHT HAND  Result Value Ref Range Status   Specimen Description BLOOD RIGHT HAND  Final   Special Requests   Final    BOTTLES DRAWN AEROBIC AND ANAEROBIC Blood Culture results may not be optimal due to an inadequate volume of blood received in culture bottles   Culture   Final    NO GROWTH 4 DAYS Performed at [facility], [address] [place], [place] [address]    Report Status PENDING  Incomplete     Radiology Studies: No results found.  Scheduled Meds:  diltiazem   120 mg Oral Daily   DULoxetine   40 mg Oral Daily   Gerhardt's butt cream   Topical TID   insulin  aspart  0-20 Units Subcutaneous TID WC   insulin  aspart  6 Units Subcutaneous TID WC   insulin  glargine  25 Units Subcutaneous Daily   levothyroxine   100 mcg Oral Daily   multivitamin with minerals  1 tablet Oral Daily   nortriptyline   75 mg Oral BID   nutrition supplement (JUVEN)  1 packet Oral BID BM   pantoprazole   40 mg Oral Daily   polyethylene glycol  17 g Oral BID   predniSONE   5 mg Oral Q breakfast   pregabalin   200 mg Oral TID   Ensure Max Protein  11 oz Oral Daily   rivaroxaban   20 mg Oral Q breakfast   rosuvastatin   20 mg Oral QHS   senna-docusate  1 tablet  Oral BID   Continuous Infusions:  cefTRIAXone  (ROCEPHIN )  IV 2 g (05/06/24 0937)   vancomycin  150 mL/hr at 05/06/24 1422     LOS: 4 days     Lebron JINNY Cage, MD [facility]  If 7PM-7AM, please contact night-coverage [email or web address]  05/06/2024, 2:38 PM

## 2024-05-06 NOTE — Plan of Care (Signed)

## 2024-05-07 ENCOUNTER — Other Ambulatory Visit (HOSPITAL_COMMUNITY): Payer: Self-pay

## 2024-05-07 DIAGNOSIS — L03115 Cellulitis of right lower limb: Secondary | ICD-10-CM | POA: Diagnosis not present

## 2024-05-07 LAB — CULTURE, BLOOD (ROUTINE X 2): Culture: NO GROWTH

## 2024-05-07 LAB — BASIC METABOLIC PANEL WITH GFR
Anion gap: 13 (ref 5–15)
BUN: 15 mg/dL (ref 6–20)
CO2: 24 mmol/L (ref 22–32)
Calcium: 8.4 mg/dL — ABNORMAL LOW (ref 8.9–10.3)
Chloride: 102 mmol/L (ref 98–111)
Creatinine, Ser: 1.09 mg/dL (ref 0.61–1.24)
GFR, Estimated: >60 mL/min (ref >60)
Glucose, Bld: 180 mg/dL — ABNORMAL HIGH (ref 70–99)
Potassium: 4 mmol/L (ref 3.5–5.1)
Sodium: 139 mmol/L (ref 135–145)

## 2024-05-07 LAB — GLUCOSE, CAPILLARY
Glucose-Capillary: 206 mg/dL — ABNORMAL HIGH (ref 70–99)
Glucose-Capillary: 215 mg/dL — ABNORMAL HIGH (ref 70–99)
Glucose-Capillary: 215 mg/dL — ABNORMAL HIGH (ref 70–99)

## 2024-05-07 MED ORDER — GERHARDT'S BUTT CREAM
1.0000 | TOPICAL_CREAM | Freq: Three times a day (TID) | CUTANEOUS | 0 refills | Status: AC
  Filled 2024-05-07: qty 60, 20d supply, fill #0

## 2024-05-07 MED ORDER — DOXYCYCLINE HYCLATE 100 MG PO TABS
100.0000 mg | ORAL_TABLET | Freq: Two times a day (BID) | ORAL | 0 refills | Status: AC
  Filled 2024-05-07: qty 8, 4d supply, fill #0

## 2024-05-07 NOTE — Progress Notes (Signed)
 Wound care and dressing change was done by provider's orders. Patient tolerated it well.

## 2024-05-07 NOTE — Discharge Summary (Signed)
 Physician Discharge Summary   Patient: Carl Hill MRN: [id number] DOB: 10/23/1970  Admit date:     05/01/2024  Discharge date: 05/07/24  Discharge Physician: Lebron JINNY Cage   PCP: Emilio Joesph DEL, PA-C   Recommendations at discharge:    Follow up with PCP in 1 week   Discharge Diagnoses: Principal Problem:   Cellulitis   Hospital Course: Carl Hill is a 53 y.o. male with a PMH significant for obesity, atrial fibrillation, insulin -dependent diabetes, HTN, HLD, hypothyroidism, chronic venous stasis, adrenal insufficiency. At baseline, is able to ambulate short distances independently. Patient presented from home with lower extremity wounds appear to be worsening over the past week, noted minor skin tear previously a month ago with worsening surrounding erythema pain and swelling. Patient admitted for further management.     Today, patient denies any new complaints.  RLE cellulitis improving.  Patient advised to follow-up with PCP in 1 week.  Home health RN for wound management arranged.  Discussed extensively with patient for strict blood sugar control, weight loss and referral to a wound clinic as needed.   Assessment and Plan: No notes have been filed under this hospital service. Service: Hospitalist   RLE Cellulitis - does not meet sepsis criteria, POA Immunosuppressed on chronic steroids with uncontrolled DM History of BLE chronic venous stasis Currently afebrile with no leukocytosis BC X 2 NGTD Discussed with ID on 12/5, continue current management, elevate legs S/p IV vancomycin , ceftriaxone  x 6 days, switch to p.o. doxycycline  to complete 10 days of antibiotics  Home health RN arranged for wound care Follow-up with PCP, wound care center as needed   Insulin -dependent type 2 diabetes mellitus, uncontrolled Repeat A1c 9.1 Continue home regimen, follow-up with endocrinologist Dr. Beryl     Severe peripheral neuropathy - Continue home duloxetine ,  nortriptyline  Hypothyroidism - Continue home levothyroxine  Paroxysmal Atrial fibrillation - Continue home Cardizem , Xarelto  HLD - Continue home rosuvastatin  Morbid obesity- Lifestyle modification advised        Consultants: None Procedures performed: None Disposition: Home health Diet recommendation:  Cardiac and Carb modified diet    DISCHARGE MEDICATION: Allergies as of 05/07/2024       Reactions   Bee Venom Anaphylaxis   Other reaction(s): Unknown Other reaction(s): Unknown Other reaction(s): Unknown   Sglt2 Inhibitors Other (See Comments)   Necrotizing infection of the perineum   Other Hives        Medication List     TAKE these medications    acetaminophen  500 MG tablet Commonly known as: TYLENOL  Take 500-1,000 mg by mouth every 6 (six) hours as needed for headache or mild pain (pain score 1-3).   albuterol  108 (90 Base) MCG/ACT inhaler Commonly known as: VENTOLIN  HFA Inhale 1-2 puffs into the lungs every 6 (six) hours as needed for wheezing or shortness of breath.   ALIVE MENS ENERGY PO Take 1 tablet by mouth daily.   Cholecalciferol 50 MCG (2000 UT) Tabs Take 2,000 Units by mouth at bedtime.   diltiazem  120 MG 24 hr capsule Commonly known as: CARDIZEM  CD Take 120 mg by mouth.   doxycycline  100 MG tablet Commonly known as: VIBRA -TABS Take 1 tablet (100 mg total) by mouth 2 (two) times daily for 4 days.   DULoxetine  20 MG capsule Commonly known as: CYMBALTA  Take 40 mg by mouth daily. For neuropathy   Gerhardt's butt cream Crea Apply 1 Application topically 3 (three) times daily.   HumuLIN R  U-500 KwikPen 500 UNIT/ML KwikPen Generic drug: insulin   regular human CONCENTRATED Inject 90-100 Units into the skin See admin instructions. Inject 100 Units into the skin 30 (thirty) minutes before breakfast AND 100 Units 30 (thirty) minutes before lunch AND 90 Units 30 (thirty) minutes before supper   levothyroxine  100 MCG tablet Commonly known as:  SYNTHROID  Take 100 mcg by mouth daily.   magnesium  oxide 400 (240 Mg) MG tablet Commonly known as: MAG-OX Take 400 mg by mouth at bedtime.   Mounjaro 10 MG/0.5ML Pen Generic drug: tirzepatide Inject 10 mg into the skin every "Sunday.   nortriptyline 75 MG capsule Commonly known as: PAMELOR Take 75 mg by mouth 2 (two) times daily. For neuropathy   OMEGA 3-6-9 PO Take 1 capsule by mouth at bedtime.   omeprazole 20 MG capsule Commonly known as: PRILOSEC Take 20 mg by mouth 2 (two) times daily.   OXYGEN Inhale 2 L into the lungs at bedtime as needed (low O2 sat).   polyethylene glycol 17 g packet Commonly known as: MIRALAX / GLYCOLAX Take 17 g by mouth daily.   predniSONE 5 MG tablet Commonly known as: DELTASONE Take 2.5-5 mg by mouth See admin instructions. Take one-half (2.5mg) tablet by mouth in the morning and one tablet (5mg) at bedtime   pregabalin 200 MG capsule Commonly known as: LYRICA Take 200 mg by mouth 3 (three) times daily.   QC TUMERIC COMPLEX PO Take 2,250 mg by mouth at bedtime. QUNOL TURMERIC with BLACK PEPPER & GINGER 2250mg per capsule   rivaroxaban 20 MG Tabs tablet Commonly known as: XARELTO Take 1 tablet (20 mg total) by mouth daily with supper. What changed: when to take this   rosuvastatin 20 MG tablet Commonly known as: CRESTOR Take 20 mg by mouth at bedtime.               Durable Medical Equipment  (From admission, onward)           Start     Ordered   05/04/24 1705  For home use only DME Hospital bed  Once       Comments: Height:  6'6, Weight - 249 kg, Bariatric bed needed  Question Answer Comment  Length of Need 12 Months   Patient has (list medical condition): LLE extremity cellulitis, Obesity, atrial fibrillation, chronic venous stasis, oxygen dependent   The above medical condition requires: Patient requires the ability to reposition frequently   Head must be elevated greater than: 30 degrees   Bed type Semi-electric    Support Surface: Gel Overlay      12" /04/25 1704            Contact information for follow-up providers     Emilio Joesph DEL, PA-C. Schedule an appointment as soon as possible for a visit in 1 week(s).   Specialty: Physician Assistant Contact information: [address] [place] [address] [phone]              Contact information for after-discharge care     Home Medical Care     [facility]) .   Service: Home Health Services Contact information: [address] [place]  [address] [phone]                    Discharge Exam: Carl Hill   05/01/24 2338 05/06/24 1631  Weight: (!) 249.5 kg (!) 249 kg   General: NAD, morbidly obese Cardiovascular: S1, S2 present Respiratory: CTAB Abdomen:  Soft, nontender, nondistended, bowel sounds present Musculoskeletal: No bilateral pedal edema noted Skin: Noted b/l LE venous stasis, with RLE dressing c/d/i Psychiatry: Normal mood   Condition at discharge: stable  The results of significant diagnostics from this hospitalization (including imaging, microbiology, ancillary and laboratory) are listed below for reference.   Imaging Studies: No results found.  Microbiology: Results for orders placed or performed during the hospital encounter of 05/01/24  Culture, blood (Routine X 2) w Reflex to ID Panel     Status: None   Collection Time: 05/01/24  9:07 PM   Specimen: BLOOD  Result Value Ref Range Status   Specimen Description BLOOD SITE NOT SPECIFIED  Final   Special Requests   Final    BOTTLES DRAWN AEROBIC AND ANAEROBIC Blood Culture results may not be optimal due to an inadequate volume of blood received in culture bottles   Culture   Final    NO GROWTH 5 DAYS Performed at [facility], [address] [place], [place] [address]    Report Status 05/06/2024 FINAL  Final  Culture, blood (Routine X 2) w Reflex to  ID Panel     Status: None   Collection Time: 05/01/24 11:39 PM   Specimen: BLOOD RIGHT HAND  Result Value Ref Range Status   Specimen Description BLOOD RIGHT HAND  Final   Special Requests   Final    BOTTLES DRAWN AEROBIC AND ANAEROBIC Blood Culture results may not be optimal due to an inadequate volume of blood received in culture bottles   Culture   Final    NO GROWTH 5 DAYS Performed at [facility], [address], [place], [place] [address]    Report Status 05/07/2024 FINAL  Final    Labs: CBC: Recent Labs  Lab 05/01/24 2006 05/02/24 0157 05/04/24 0431  WBC 10.0 9.6 7.5  NEUTROABS 7.1  --  4.0  HGB 13.7 13.1 12.4*  HCT 44.8 42.5 40.2  MCV 82.2 81.3 82.4  PLT 223 218 175   Basic Metabolic Panel: Recent Labs  Lab 05/02/24 0157 05/04/24 0431 05/05/24 0533 05/06/24 0555 05/07/24 0612  NA 138 138 139 138 139  K 3.7 3.9 3.8 4.9 4.0  CL 102 101 103 99 102  CO2 26 28 28 25 24   GLUCOSE 159* 225* 221* 230* 180*  BUN 15 15 17 18 15   CREATININE 1.13 0.88 0.96 1.00 1.09  CALCIUM  8.3* 8.1* 8.2* 8.3* 8.4*   Liver Function Tests: No results for input(s): AST, ALT, ALKPHOS, BILITOT, PROT, ALBUMIN  in the last 168 hours. CBG: Recent Labs  Lab 05/05/24 2037 05/06/24 0851 05/06/24 1230 05/06/24 1628 05/07/24 0743  GLUCAP 201* 269* 328* 221* 206*    Discharge time spent: greater than 30 minutes.  Signed: Lebron JINNY Cage, MD [facility] 05/07/2024

## 2024-05-07 NOTE — Plan of Care (Signed)

## 2024-05-08 ENCOUNTER — Other Ambulatory Visit (HOSPITAL_COMMUNITY): Payer: Self-pay

## 2024-05-08 LAB — GLUCOSE, CAPILLARY: Glucose-Capillary: 216 mg/dL — ABNORMAL HIGH (ref 70–99)

## 2024-05-09 ENCOUNTER — Other Ambulatory Visit (HOSPITAL_COMMUNITY): Payer: Self-pay

## 2024-05-21 ENCOUNTER — Other Ambulatory Visit: Payer: Self-pay

## 2024-05-21 ENCOUNTER — Emergency Department (HOSPITAL_COMMUNITY)

## 2024-05-21 ENCOUNTER — Inpatient Hospital Stay (HOSPITAL_COMMUNITY)
Admission: EM | Admit: 2024-05-21 | Discharge: 2024-05-30 | DRG: 638 | Disposition: A | Discharge status: Home Health | Attending: Internal Medicine | Admitting: Internal Medicine

## 2024-05-21 ENCOUNTER — Encounter (HOSPITAL_COMMUNITY): Payer: Self-pay

## 2024-05-21 DIAGNOSIS — I10 Essential (primary) hypertension: Secondary | ICD-10-CM | POA: Diagnosis present

## 2024-05-21 DIAGNOSIS — E119 Type 2 diabetes mellitus without complications: Secondary | ICD-10-CM | POA: Diagnosis not present

## 2024-05-21 DIAGNOSIS — G8929 Other chronic pain: Secondary | ICD-10-CM | POA: Diagnosis present

## 2024-05-21 DIAGNOSIS — J4520 Mild intermittent asthma, uncomplicated: Secondary | ICD-10-CM | POA: Diagnosis present

## 2024-05-21 DIAGNOSIS — Z79890 Hormone replacement therapy (postmenopausal): Secondary | ICD-10-CM

## 2024-05-21 DIAGNOSIS — E1142 Type 2 diabetes mellitus with diabetic polyneuropathy: Secondary | ICD-10-CM | POA: Diagnosis present

## 2024-05-21 DIAGNOSIS — E8809 Other disorders of plasma-protein metabolism, not elsewhere classified: Secondary | ICD-10-CM | POA: Diagnosis present

## 2024-05-21 DIAGNOSIS — Z91030 Bee allergy status: Secondary | ICD-10-CM

## 2024-05-21 DIAGNOSIS — Z79899 Other long term (current) drug therapy: Secondary | ICD-10-CM

## 2024-05-21 DIAGNOSIS — D84821 Immunodeficiency due to drugs: Secondary | ICD-10-CM | POA: Diagnosis present

## 2024-05-21 DIAGNOSIS — Z888 Allergy status to other drugs, medicaments and biological substances status: Secondary | ICD-10-CM

## 2024-05-21 DIAGNOSIS — I482 Chronic atrial fibrillation, unspecified: Secondary | ICD-10-CM | POA: Diagnosis present

## 2024-05-21 DIAGNOSIS — Z833 Family history of diabetes mellitus: Secondary | ICD-10-CM

## 2024-05-21 DIAGNOSIS — D649 Anemia, unspecified: Secondary | ICD-10-CM | POA: Diagnosis present

## 2024-05-21 DIAGNOSIS — Z7901 Long term (current) use of anticoagulants: Secondary | ICD-10-CM | POA: Diagnosis not present

## 2024-05-21 DIAGNOSIS — Z7952 Long term (current) use of systemic steroids: Secondary | ICD-10-CM

## 2024-05-21 DIAGNOSIS — E11621 Type 2 diabetes mellitus with foot ulcer: Principal | ICD-10-CM | POA: Diagnosis present

## 2024-05-21 DIAGNOSIS — L03116 Cellulitis of left lower limb: Secondary | ICD-10-CM | POA: Diagnosis present

## 2024-05-21 DIAGNOSIS — Z6841 Body Mass Index (BMI) 40.0 and over, adult: Secondary | ICD-10-CM

## 2024-05-21 DIAGNOSIS — L97429 Non-pressure chronic ulcer of left heel and midfoot with unspecified severity: Secondary | ICD-10-CM | POA: Diagnosis present

## 2024-05-21 DIAGNOSIS — E2740 Unspecified adrenocortical insufficiency: Secondary | ICD-10-CM | POA: Diagnosis present

## 2024-05-21 DIAGNOSIS — E039 Hypothyroidism, unspecified: Secondary | ICD-10-CM | POA: Diagnosis present

## 2024-05-21 DIAGNOSIS — L03115 Cellulitis of right lower limb: Secondary | ICD-10-CM | POA: Diagnosis present

## 2024-05-21 DIAGNOSIS — G4733 Obstructive sleep apnea (adult) (pediatric): Secondary | ICD-10-CM | POA: Diagnosis present

## 2024-05-21 DIAGNOSIS — E785 Hyperlipidemia, unspecified: Secondary | ICD-10-CM | POA: Diagnosis present

## 2024-05-21 DIAGNOSIS — Z794 Long term (current) use of insulin: Secondary | ICD-10-CM | POA: Diagnosis not present

## 2024-05-21 DIAGNOSIS — F17210 Nicotine dependence, cigarettes, uncomplicated: Secondary | ICD-10-CM | POA: Diagnosis present

## 2024-05-21 DIAGNOSIS — E273 Drug-induced adrenocortical insufficiency: Secondary | ICD-10-CM | POA: Diagnosis present

## 2024-05-21 DIAGNOSIS — L97419 Non-pressure chronic ulcer of right heel and midfoot with unspecified severity: Secondary | ICD-10-CM | POA: Diagnosis present

## 2024-05-21 DIAGNOSIS — I872 Venous insufficiency (chronic) (peripheral): Secondary | ICD-10-CM | POA: Diagnosis present

## 2024-05-21 DIAGNOSIS — E1165 Type 2 diabetes mellitus with hyperglycemia: Secondary | ICD-10-CM | POA: Diagnosis not present

## 2024-05-21 DIAGNOSIS — L0390 Cellulitis, unspecified: Secondary | ICD-10-CM | POA: Diagnosis present

## 2024-05-21 DIAGNOSIS — Z5320 Procedure and treatment not carried out because of patient's decision for unspecified reasons: Secondary | ICD-10-CM | POA: Diagnosis present

## 2024-05-21 DIAGNOSIS — G894 Chronic pain syndrome: Secondary | ICD-10-CM | POA: Diagnosis present

## 2024-05-21 LAB — COMPREHENSIVE METABOLIC PANEL WITH GFR
ALT: 20 U/L (ref 0–44)
AST: 29 U/L (ref 15–41)
Albumin: 3.6 g/dL (ref 3.5–5.0)
Alkaline Phosphatase: 90 U/L (ref 38–126)
Anion gap: 12 (ref 5–15)
BUN: 19 mg/dL (ref 6–20)
CO2: 26 mmol/L (ref 22–32)
Calcium: 9.2 mg/dL (ref 8.9–10.3)
Chloride: 100 mmol/L (ref 98–111)
Creatinine, Ser: 1.09 mg/dL (ref 0.61–1.24)
GFR, Estimated: >60 mL/min
Glucose, Bld: 262 mg/dL — ABNORMAL HIGH (ref 70–99)
Potassium: 4 mmol/L (ref 3.5–5.1)
Sodium: 138 mmol/L (ref 135–145)
Total Bilirubin: 0.4 mg/dL (ref 0.0–1.2)
Total Protein: 7.4 g/dL (ref 6.5–8.1)

## 2024-05-21 LAB — CBC WITH DIFFERENTIAL/PLATELET
Abs Immature Granulocytes: 0.02 K/uL (ref 0.00–0.07)
Basophils Absolute: 0.1 K/uL (ref 0.0–0.1)
Basophils Relative: 1 %
Eosinophils Absolute: 0.2 K/uL (ref 0.0–0.5)
Eosinophils Relative: 3 %
HCT: 44.8 % (ref 39.0–52.0)
Hemoglobin: 13.5 g/dL (ref 13.0–17.0)
Immature Granulocytes: 0 %
Lymphocytes Relative: 35 %
Lymphs Abs: 2.1 K/uL (ref 0.7–4.0)
MCH: 25.6 pg — ABNORMAL LOW (ref 26.0–34.0)
MCHC: 30.1 g/dL (ref 30.0–36.0)
MCV: 84.8 fL (ref 80.0–100.0)
Monocytes Absolute: 0.6 K/uL (ref 0.1–1.0)
Monocytes Relative: 9 %
Neutro Abs: 3.2 K/uL (ref 1.7–7.7)
Neutrophils Relative %: 52 %
Platelets: 257 K/uL (ref 150–400)
RBC: 5.28 MIL/uL (ref 4.22–5.81)
RDW: 18.1 % — ABNORMAL HIGH (ref 11.5–15.5)
WBC: 6.2 K/uL (ref 4.0–10.5)
nRBC: 0 % (ref 0.0–0.2)

## 2024-05-21 LAB — PROTIME-INR
INR: 1.1 (ref 0.8–1.2)
Prothrombin Time: 14.5 s (ref 11.4–15.2)

## 2024-05-21 LAB — I-STAT CG4 LACTIC ACID, ED
Lactic Acid, Venous: 3.2 mmol/L (ref 0.5–1.9)
Lactic Acid, Venous: 2 mmol/L (ref 0.5–1.9)

## 2024-05-21 LAB — PRO BRAIN NATRIURETIC PEPTIDE: Pro Brain Natriuretic Peptide: 174 pg/mL

## 2024-05-21 MED ORDER — DULOXETINE HCL 20 MG PO CPEP
20.0000 mg | ORAL_CAPSULE | Freq: Two times a day (BID) | ORAL | Status: DC
  Administered 2024-05-21 – 2024-05-30 (×18): 20 mg via ORAL
  Filled 2024-05-21 (×17): qty 1

## 2024-05-21 MED ORDER — SODIUM CHLORIDE 0.9 % IV SOLN
2.0000 g | INTRAVENOUS | Status: DC
  Administered 2024-05-22 – 2024-05-23 (×2): 2 g via INTRAVENOUS
  Filled 2024-05-21 (×2): qty 20

## 2024-05-21 MED ORDER — RIVAROXABAN 20 MG PO TABS
20.0000 mg | ORAL_TABLET | Freq: Every morning | ORAL | Status: DC
  Administered 2024-05-21 – 2024-05-30 (×10): 20 mg via ORAL
  Filled 2024-05-21 (×3): qty 1
  Filled 2024-05-21: qty 2
  Filled 2024-05-21 (×6): qty 1

## 2024-05-21 MED ORDER — ALBUTEROL SULFATE (2.5 MG/3ML) 0.083% IN NEBU
3.0000 mL | INHALATION_SOLUTION | Freq: Four times a day (QID) | RESPIRATORY_TRACT | Status: DC | PRN
  Administered 2024-05-25: 3 mL via RESPIRATORY_TRACT
  Filled 2024-05-21: qty 3

## 2024-05-21 MED ORDER — PREDNISONE 5 MG PO TABS
2.5000 mg | ORAL_TABLET | Freq: Every day | ORAL | Status: DC
  Administered 2024-05-22 – 2024-05-30 (×9): 2.5 mg via ORAL
  Filled 2024-05-21 (×8): qty 1

## 2024-05-21 MED ORDER — LACTATED RINGERS IV BOLUS
1000.0000 mL | Freq: Once | INTRAVENOUS | Status: AC
  Administered 2024-05-21: 1000 mL via INTRAVENOUS

## 2024-05-21 MED ORDER — HYDROCODONE-ACETAMINOPHEN 5-325 MG PO TABS
1.0000 | ORAL_TABLET | Freq: Four times a day (QID) | ORAL | Status: DC | PRN
  Administered 2024-05-22 – 2024-05-29 (×13): 1 via ORAL
  Filled 2024-05-21 (×13): qty 1

## 2024-05-21 MED ORDER — LEVOTHYROXINE SODIUM 100 MCG PO TABS
100.0000 ug | ORAL_TABLET | Freq: Every day | ORAL | Status: DC
  Administered 2024-05-22 – 2024-05-30 (×9): 100 ug via ORAL
  Filled 2024-05-21 (×8): qty 1

## 2024-05-21 MED ORDER — SODIUM CHLORIDE 0.9% FLUSH
3.0000 mL | Freq: Two times a day (BID) | INTRAVENOUS | Status: DC
  Administered 2024-05-21 – 2024-05-30 (×17): 3 mL via INTRAVENOUS

## 2024-05-21 MED ORDER — GERHARDT'S BUTT CREAM: 1.0000 | TOPICAL_CREAM | Freq: Three times a day (TID) | CUTANEOUS | Status: DC | PRN

## 2024-05-21 MED ORDER — SODIUM CHLORIDE 0.9 % IV SOLN
2.0000 g | Freq: Once | INTRAVENOUS | Status: AC
  Administered 2024-05-21: 2 g via INTRAVENOUS
  Filled 2024-05-21: qty 20

## 2024-05-21 MED ORDER — ACETAMINOPHEN 650 MG RE SUPP: 650.0000 mg | Freq: Four times a day (QID) | RECTAL | Status: DC | PRN

## 2024-05-21 MED ORDER — PREDNISONE 5 MG PO TABS: 2.5000 mg | ORAL_TABLET | ORAL | Status: DC

## 2024-05-21 MED ORDER — PREDNISONE 5 MG PO TABS
5.0000 mg | ORAL_TABLET | Freq: Every day | ORAL | Status: DC
  Administered 2024-05-21 – 2024-05-29 (×9): 5 mg via ORAL
  Filled 2024-05-21 (×9): qty 1

## 2024-05-21 MED ORDER — POLYETHYLENE GLYCOL 3350 17 G PO PACK: 17.0000 g | PACK | Freq: Every day | ORAL | Status: DC | PRN

## 2024-05-21 MED ORDER — NORTRIPTYLINE HCL 25 MG PO CAPS
75.0000 mg | ORAL_CAPSULE | Freq: Two times a day (BID) | ORAL | Status: DC
  Administered 2024-05-21 – 2024-05-30 (×18): 75 mg via ORAL
  Filled 2024-05-21 (×18): qty 3

## 2024-05-21 MED ORDER — PANTOPRAZOLE SODIUM 40 MG PO TBEC
40.0000 mg | DELAYED_RELEASE_TABLET | Freq: Every day | ORAL | Status: DC
  Administered 2024-05-22 – 2024-05-30 (×9): 40 mg via ORAL
  Filled 2024-05-21 (×7): qty 1

## 2024-05-21 MED ORDER — ROSUVASTATIN CALCIUM 20 MG PO TABS
20.0000 mg | ORAL_TABLET | Freq: Every day | ORAL | Status: DC
  Administered 2024-05-21 – 2024-05-29 (×9): 20 mg via ORAL
  Filled 2024-05-21 (×9): qty 1

## 2024-05-21 MED ORDER — MORPHINE SULFATE (PF) 4 MG/ML IV SOLN
4.0000 mg | Freq: Once | INTRAVENOUS | Status: AC
  Administered 2024-05-21: 4 mg via INTRAVENOUS
  Filled 2024-05-21: qty 1

## 2024-05-21 MED ORDER — ONDANSETRON HCL 4 MG/2ML IJ SOLN
4.0000 mg | Freq: Once | INTRAMUSCULAR | Status: AC
  Administered 2024-05-21: 4 mg via INTRAVENOUS
  Filled 2024-05-21: qty 2

## 2024-05-21 MED ORDER — PREGABALIN 100 MG PO CAPS
200.0000 mg | ORAL_CAPSULE | Freq: Three times a day (TID) | ORAL | Status: DC
  Administered 2024-05-21 – 2024-05-30 (×27): 200 mg via ORAL
  Filled 2024-05-21 (×12): qty 2
  Filled 2024-05-21: qty 8
  Filled 2024-05-21 (×9): qty 2
  Filled 2024-05-21: qty 8
  Filled 2024-05-21 (×3): qty 2

## 2024-05-21 MED ORDER — ACETAMINOPHEN 325 MG PO TABS: 650.0000 mg | ORAL_TABLET | Freq: Four times a day (QID) | ORAL | Status: DC | PRN

## 2024-05-21 MED ORDER — DILTIAZEM HCL ER COATED BEADS 120 MG PO CP24
120.0000 mg | ORAL_CAPSULE | Freq: Every day | ORAL | Status: DC
  Administered 2024-05-22 – 2024-05-30 (×9): 120 mg via ORAL
  Filled 2024-05-21 (×8): qty 1

## 2024-05-21 NOTE — ED Triage Notes (Signed)
 Pt arrives via EMS from home. Pt reports blisters to bilateral legs since yesterday. Reports he was recently treated for cellulitis. Pt arrives AxOx4.

## 2024-05-21 NOTE — Consult Note (Signed)
 Please note that the [facility] nursing team is utilizing a standardized work plan to manage patient consults. We are triaging consults and will try to see the patients within 24 hours. Wound photos in the patient's chart allow us  to consult on the patient in the most efficient and timely   Carl Hill [facility], CNS, CWON-AP [phone] manner.

## 2024-05-21 NOTE — ED Notes (Signed)
 6N notified of patient being transported to the floor.

## 2024-05-21 NOTE — ED Provider Notes (Signed)
 " [facility] Provider Note   CSN: [id number] Arrival date & time: 05/21/24  9255     Patient presents with: Blister and Recurrent Skin Infections   Carl Hill is a 53 y.o. male.   53 year old male with past medical history of morbid obesity and recent admission for lower extremity cellulitis presenting to the emergency department today for swelling and blistering of his bilateral lower extremities.  The patient states that he has noticed some erythema that is getting worse on the right lower extremity over the past few days.  He completed antibiotics that were prescribed here in the hospital.  He has had worsening blistering of the bilateral lower extremities over the past few days.  He was in touch with his home health nurses and eventually was told/decided to come to the ER for further evaluation.  The patient denies any fevers.  He states that he has been having issues with getting the appropriate size hospital bed at his home so he has been sitting and sleeping mostly upright over the past few days.  He denies any shortness of breath compared to baseline.  He is able to ambulate on his own with a walker.        Prior to Admission medications  Medication Sig Start Date End Date Taking? Authorizing Provider  acetaminophen  (TYLENOL ) 500 MG tablet Take 500-1,000 mg by mouth every 6 (six) hours as needed for headache or mild pain (pain score 1-3).   Yes [provider]  albuterol  (VENTOLIN  HFA) 108 (90 Base) MCG/ACT inhaler Inhale 1-2 puffs into the lungs every 6 (six) hours as needed for wheezing or shortness of breath.   Yes [provider]  Calcium  Carbonate-Vit D-Min (CALCIUM  1200 PO) Take 1 tablet by mouth daily.   Yes [provider]  Cholecalciferol  50 MCG (2000 UT) TABS Take 2,000 Units by mouth at bedtime.   Yes [provider]  diltiazem  (CARDIZEM  CD) 120 MG 24 hr capsule Take 120 mg by mouth  daily. 04/07/24  Yes [provider]  docusate sodium  (COLACE) 100 MG capsule Take 100 mg by mouth at bedtime.   Yes [provider]  DULoxetine  (CYMBALTA ) 20 MG capsule Take 20 mg by mouth 2 (two) times daily. For neuropathy   Yes [provider]  HUMULIN R  U-500 KWIKPEN 500 UNIT/ML KwikPen Inject 90-100 Units into the skin See admin instructions. Inject 100 Units into the skin 30 (thirty) minutes before breakfast AND 100 Units 30 (thirty) minutes before lunch AND 90 Units 30 (thirty) minutes before supper 04/20/24  Yes [provider]  levothyroxine  (SYNTHROID ) 100 MCG tablet Take 100 mcg by mouth daily. 04/03/20  Yes [provider]  magnesium  oxide (MAG-OX) 400 (240 Mg) MG tablet Take 400 mg by mouth at bedtime.   Yes [provider]  MOUNJARO 10 MG/0.5ML Pen Inject 10 mg into the skin every Sunday. 04/19/24  Yes [provider]  Multiple Vitamins-Minerals (ALIVE MENS ENERGY PO) Take 1 tablet by mouth daily.   Yes [provider]  nortriptyline  (PAMELOR ) 75 MG capsule Take 75 mg by mouth 2 (two) times daily. For neuropathy   Yes [provider]  Nystatin  (GERHARDT'S BUTT CREAM) CREA Apply 1 Application topically 3 (three) times daily. Patient taking differently: Apply 1 Application topically 3 (three) times daily as needed for irritation. 05/07/24 06/06/24 Yes Ezenduka, Nkeiruka J, MD  Omega 3-6-9 Fatty Acids (OMEGA 3-6-9 PO) Take 1 capsule by mouth  at bedtime.   Yes [provider]  omeprazole (PRILOSEC) 20 MG capsule Take 20 mg by mouth 2 (two) times daily. 10/07/20  Yes [provider]  OXYGEN Inhale 2 L into the lungs at bedtime as needed (low O2 sat).   Yes [provider]  polyethylene glycol (MIRALAX  / GLYCOLAX ) 17 g packet Take 17 g by mouth daily. Patient taking differently: Take 17 g by mouth daily as needed for moderate constipation. 09/12/20  Yes Love, Sharlet RAMAN, PA-C  predniSONE   (DELTASONE ) 5 MG tablet Take 2.5-5 mg by mouth See admin instructions. Take one-half (2.5mg ) tablet by mouth in the morning and one tablet (5mg ) at bedtime   Yes [provider]  pregabalin  (LYRICA ) 200 MG capsule Take 200 mg by mouth 3 (three) times daily.   Yes [provider]  rivaroxaban  (XARELTO ) 20 MG TABS tablet Take 1 tablet (20 mg total) by mouth daily with supper. Patient taking differently: Take 20 mg by mouth in the morning. 09/12/20  Yes Love, Sharlet RAMAN, PA-C  rosuvastatin  (CRESTOR ) 20 MG tablet Take 20 mg by mouth at bedtime. 11/15/23  Yes [provider]  Turmeric (QC TUMERIC COMPLEX PO) Take 2,250 mg by mouth at bedtime. QUNOL TURMERIC with BLACK PEPPER & GINGER 2250mg  per capsule   Yes [provider]    Allergies: Bee venom, Sglt2 inhibitors, and Other    Review of Systems  Skin:  Positive for color change and wound.  All other systems reviewed and are negative.   Updated Vital Signs BP 95/85   Pulse (!) 112   Temp 98 F (36.7 C) (Oral)   Resp 12   SpO2 97%   Physical Exam Vitals and nursing note reviewed.   Gen: NAD Eyes: PERRL, EOMI HEENT: no oropharyngeal swelling Neck: trachea midline Resp: clear to auscultation bilaterally Card: Tachycardic, no murmurs, rubs, or gallops Abd: nontender, nondistended Extremities: The patient does have findings of chronic venous stasis changes to the bilateral lower extremities, there is some erythema and warmth noted to the right mid shin down over the right lower extremity, no purulent drainage is noted, there are multiple blisters noted on the bilateral lower extremities Vascular: 2+ radial pulses bilaterally, 2+ DP pulses bilaterally Skin: no rashes Psyc: acting appropriately   (all labs ordered are listed, but only abnormal results are displayed) Labs Reviewed  COMPREHENSIVE METABOLIC PANEL WITH GFR - Abnormal; Notable for the following components:      Result Value   Glucose, Bld  262 (*)    All other components within normal limits  CBC WITH DIFFERENTIAL/PLATELET - Abnormal; Notable for the following components:   MCH 25.6 (*)    RDW 18.1 (*)    All other components within normal limits  I-STAT CG4 LACTIC ACID, ED - Abnormal; Notable for the following components:   Lactic Acid, Venous 3.2 (*)    All other components within normal limits  I-STAT CG4 LACTIC ACID, ED - Abnormal; Notable for the following components:   Lactic Acid, Venous 2.0 (*)    All other components within normal limits  CULTURE, BLOOD (ROUTINE X 2)  CULTURE, BLOOD (ROUTINE X 2)  CULTURE, BLOOD (ROUTINE X 2)  CULTURE, BLOOD (ROUTINE X 2)  PROTIME-INR  URINALYSIS, W/ REFLEX TO CULTURE (INFECTION SUSPECTED)  PRO BRAIN NATRIURETIC PEPTIDE    EKG: EKG Interpretation Date/Time:  Sunday May 21 2024 10:17:14 EST Ventricular Rate:  110 PR Interval:  182 QRS Duration:  110 QT Interval:  340 QTC  Calculation: 460 R Axis:   -19  Text Interpretation: Sinus tachycardia Borderline left axis deviation Low voltage, precordial leads Abnormal R-wave progression, early transition Borderline T abnormalities, anterior leads Confirmed by Ula Barter [phone]) on 05/21/2024 10:32:15 AM  Radiology: [facility] Chest Port 1 View Result Date: 05/21/2024 EXAM: 1 VIEW XRAY OF THE CHEST 05/21/2024 09:40:00 AM COMPARISON: 08/03/2022 CLINICAL HISTORY: Questionable sepsis - evaluate for abnormality FINDINGS: LUNGS AND PLEURA: Low lung volumes. Limited detailed assessment due to habitus. No focal pulmonary opacity. No pleural effusion. No pneumothorax. HEART AND MEDIASTINUM: No acute abnormality of the cardiac and mediastinal silhouettes. BONES AND SOFT TISSUES: No acute osseous abnormality. IMPRESSION: 1. No acute cardiopulmonary abnormality. Electronically signed by: Waddell Calk MD 05/21/2024 09:49 AM EST RP Workstation: [id number]     Procedures   Medications Ordered in the ED  morphine  (PF) 4 MG/ML injection 4 mg (4  mg Intravenous Given 05/21/24 1018)  ondansetron  (ZOFRAN ) injection 4 mg (4 mg Intravenous Given 05/21/24 1018)  lactated ringers  bolus 1,000 mL (1,000 mLs Intravenous New Bag/Given 05/21/24 1018)  cefTRIAXone  (ROCEPHIN ) 2 g in sodium chloride  0.9 % 100 mL IVPB (0 g Intravenous Stopped 05/21/24 1112)                                    Medical Decision Making 53 year old male with past medical history of morbid obesity and recent admission for cellulitis presenting to the emergency department today with what appears to be recurrent cellulitis of the right lower extremity.  The patient does have some blistering of the bilateral lower extremities.  Suspect this is likely due to his current situation where he has been sleeping upright due to him not having a hospital bed at home.  He is tachycardic here on arrival.  Will initiate a sepsis workup and cover the patient with Rocephin .  I will reevaluate for ultimate disposition.  The patient's lactic acid is elevated here.  Chest x-ray does not show any findings consistent with pulmonary edema or pneumonia.  Heart rate remains mildly elevated here.  The patient is covered with antibiotics.  Calls placed to hospitalist service for admission.  Will give the patient IV fluids but hold off on the full sepsis fluid bolus order set as the patient does not appear to be in septic shock based on vital signs and lactic acid and I suspect he likely has some what of a component of volume overload/edema leading to some of the symptoms.  Amount and/or Complexity of Data Reviewed Labs: ordered. Radiology: ordered.  Risk Prescription drug management. Decision regarding hospitalization.        Final diagnoses:  Cellulitis of right lower extremity    ED Discharge Orders     None          Ula Barter SAUNDERS, MD 05/21/24 1242  "

## 2024-05-21 NOTE — Consult Note (Signed)
 WOC Nurse Consult Note: Reason for Consult: LE Venous insufficeny with edema and skin changes and blisters / wonds develpoing   Wound type: full and partial thickness to B lower legs likely r/t venous insufficiency and edema; R lower leg infectious at this time currently being treated for cellulitis  Pressure Injury POA: NA  Measurement: see nursing flowsheet  Wound bed: largely red moist,  intact serous bullas left posterior;  appears to have remnants of bullae that have ruptured over the bilateral LEs Drainage (amount, consistency, odor) see nursing flowsheet Periwound: edema and erythema; underlying chronic discoloration r/t venous stasis  Dressing procedure/placement/frequency: Cleanse B lower legs with Vashe (intact skin and wounds), do not rinse and allow to air dry. Apply Xeroform gauze to open and bullous wound beds daily, cover with ABD pad and secure with Kerlix roll gauze beginning right above toes and ending right below knees.  Secure with Ace bandage wrapped in same fashion as Kerlix for light compression.  Elevate legs as much as possible.     Re consult if needed, will not follow at this time. Thanks  Lamerle Jabs [employer], RN,CWOCN, CNS, [employer] [phone]

## 2024-05-21 NOTE — H&P (Signed)
 " History and Physical   Carl Hill FMW:[id number] DOB: [birth year] DOA: 05/21/2024  PCP: Emilio Joesph DEL, PA-C   Patient coming from: Home  Chief Complaint: Recurrent cellulitis  HPI: Carl Hill is a 53 y.o. male with medical history significant of hypertension, hyperlipidemia, diabetes, venous insufficiency, atrial fibrillation, hypothyroidism, adrenal insufficiency, obesity, neuropathy, anemia, asthma, OSA, chronic pain presenting with concern for recurrent cellulitis.  Patient has chronic issues with recurrent cellulitis and was recently admitted 12/1-12/7 with right lower extremity cellulitis treated initially with IV antibiotics and then transition to p.o. at discharge to complete a 10-day course.  Did initially have improvement.  He has had 1-2 days of worsening erythema bilaterally with changes worse on the right compared to the left.  Noted to have some edema and blistering as well.  Has been following with home health at home.  They have been unable to get him a bed that fits them yet so he has been spending some time sleeping in a more upright position due to this.  This has contributed to worsening edema/weeping and blistering in his lower extremities as well.  Based on his worsening symptoms and appearance of his legs concerning for recurrent cellulitis Home health recommended he come to the ED to be reevaluated.  Patient denies fevers, chills, chest pain, shortness of breath, abdominal pain, constipation, diarrhea, nausea, vomiting.  ED Course: Vital signs in the ED notable for heart rate in the 90s-110s, blood pressure in the 90s-160 systolic.  Lab workup included CMP with glucose 262.  CBC within normal limits.  PT and INR normal.  Lactic acid initially elevated 3.2, improved to 2.0 on repeat.  proBNP pending.  Urinalysis and blood cultures pending.  Chest x-ray showed no acute abnormality.  Patient received morphine , ceftriaxone , Zofran , 1 L IV fluids in the  ED.  Review of Systems: As per HPI otherwise all other systems reviewed and are negative.  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   Asthma    Atrial fibrillation (HCC)    Diabetes mellitus without complication (HCC)    Fournier gangrene (HCC) 06/10/2020   History of cardioversion    3 times    Hypertension    Orthostatic hypotension 03/07/2021   Sepsis due to Streptococcus, group B (HCC) 06/22/2020    Past Surgical History:  Procedure Laterality Date   ABDOMINAL SURGERY     INCISION AND DRAINAGE PERIRECTAL ABSCESS N/A 06/11/2020   Procedure: IRRIGATION AND DEBRIDEMENT PERINEAL WOUND;  Surgeon: Dasie Leonor CROME, MD;  Location: [facility] OR;  Service: General;  Laterality: N/A;   IRRIGATION AND DEBRIDEMENT ABSCESS N/A 06/10/2020   Procedure: IRRIGATION AND DEBRIDEMENT ABSCESS;  Surgeon: Dasie Leonor CROME, MD;  Location: [facility] OR;  Service: General;  Laterality: N/A;   LAPAROSCOPIC GASTRIC SLEEVE RESECTION      Social History  reports that he has been smoking. He has never used smokeless tobacco. He reports that he does not currently use alcohol. He reports that he does not use drugs.  Allergies[1]  Family History  Problem Relation Age of Onset   Diabetes Mother    Healthy Brother    Breast cancer Paternal Grandmother    Pancreatic cancer Paternal Uncle   Reviewed on admission  Prior to Admission medications  Medication Sig Start Date End Date Taking? Authorizing Provider  acetaminophen  (TYLENOL ) 500 MG tablet Take 500-1,000 mg by mouth every 6 (six) hours as needed for headache or mild pain (pain score 1-3).   Yes  [provider]  albuterol  (VENTOLIN  HFA) 108 (90 Base) MCG/ACT inhaler Inhale 1-2 puffs into the lungs every 6 (six) hours as needed for wheezing or shortness of breath.   Yes [provider]  Calcium  Carbonate-Vit D-Min (CALCIUM  1200 PO) Take 1 tablet by mouth daily.   Yes [provider]  Cholecalciferol  50 MCG (2000 UT)  TABS Take 2,000 Units by mouth at bedtime.   Yes [provider]  diltiazem  (CARDIZEM  CD) 120 MG 24 hr capsule Take 120 mg by mouth daily. 04/07/24  Yes [provider]  docusate sodium  (COLACE) 100 MG capsule Take 100 mg by mouth at bedtime.   Yes [provider]  DULoxetine  (CYMBALTA ) 20 MG capsule Take 20 mg by mouth 2 (two) times daily. For neuropathy   Yes [provider]  HUMULIN R  U-500 KWIKPEN 500 UNIT/ML KwikPen Inject 90-100 Units into the skin See admin instructions. Inject 100 Units into the skin 30 (thirty) minutes before breakfast AND 100 Units 30 (thirty) minutes before lunch AND 90 Units 30 (thirty) minutes before supper 04/20/24  Yes [provider]  levothyroxine  (SYNTHROID ) 100 MCG tablet Take 100 mcg by mouth daily. 04/03/20  Yes [provider]  magnesium  oxide (MAG-OX) 400 (240 Mg) MG tablet Take 400 mg by mouth at bedtime.   Yes [provider]  MOUNJARO 10 MG/0.5ML Pen Inject 10 mg into the skin every Sunday. 04/19/24  Yes [provider]  Multiple Vitamins-Minerals (ALIVE MENS ENERGY PO) Take 1 tablet by mouth daily.   Yes [provider]  nortriptyline  (PAMELOR ) 75 MG capsule Take 75 mg by mouth 2 (two) times daily. For neuropathy   Yes [provider]  Nystatin  (GERHARDT'S BUTT CREAM) CREA Apply 1 Application topically 3 (three) times daily. Patient taking differently: Apply 1 Application topically 3 (three) times daily as needed for irritation. 05/07/24 06/06/24 Yes Ezenduka, Nkeiruka J, MD  Omega 3-6-9 Fatty Acids (OMEGA 3-6-9 PO) Take 1 capsule by mouth at bedtime.   Yes [provider]  omeprazole (PRILOSEC) 20 MG capsule Take 20 mg by mouth 2 (two) times daily. 10/07/20  Yes [provider]  OXYGEN Inhale 2 L into the lungs at bedtime as needed (low O2 sat).   Yes [provider]  polyethylene glycol (MIRALAX  / GLYCOLAX ) 17 g packet Take 17 g by mouth  daily. Patient taking differently: Take 17 g by mouth daily as needed for moderate constipation. 09/12/20  Yes Love, Sharlet RAMAN, PA-C  predniSONE  (DELTASONE ) 5 MG tablet Take 2.5-5 mg by mouth See admin instructions. Take one-half (2.5mg ) tablet by mouth in the morning and one tablet (5mg ) at bedtime   Yes [provider]  pregabalin  (LYRICA ) 200 MG capsule Take 200 mg by mouth 3 (three) times daily.   Yes [provider]  rivaroxaban  (XARELTO ) 20 MG TABS tablet Take 1 tablet (20 mg total) by mouth daily with supper. Patient taking differently: Take 20 mg by mouth in the morning. 09/12/20  Yes Love, Sharlet RAMAN, PA-C  rosuvastatin  (CRESTOR ) 20 MG tablet Take 20 mg by mouth at bedtime. 11/15/23  Yes [provider]  Turmeric (QC TUMERIC COMPLEX PO) Take 2,250 mg by mouth at bedtime. QUNOL TURMERIC with BLACK PEPPER & GINGER 2250mg  per capsule   Yes [provider]    Physical Exam: Vitals:   05/21/24 0905 05/21/24 0930 05/21/24 1030 05/21/24 1400  BP: (!) 148/77 139/71 95/85   Pulse: 99 (!) 111 (!) 112   Resp:  15 20 12    Temp:    98.4 F (36.9 C)  TempSrc:      SpO2: 99% 98% 97%     Physical Exam Constitutional:      General: He is not in acute distress.    Appearance: Normal appearance. He is obese.  HENT:     Head: Normocephalic and atraumatic.     Mouth/Throat:     Mouth: Mucous membranes are moist.     Pharynx: Oropharynx is clear.  Eyes:     Extraocular Movements: Extraocular movements intact.     Pupils: Pupils are equal, round, and reactive to light.  Cardiovascular:     Rate and Rhythm: Regular rhythm. Tachycardia present.     Pulses: Normal pulses.     Heart sounds: Normal heart sounds.  Pulmonary:     Effort: Pulmonary effort is normal. No respiratory distress.     Breath sounds: Normal breath sounds.  Abdominal:     General: Bowel sounds are normal. There is no distension.     Palpations: Abdomen is soft.     Tenderness: There is no  abdominal tenderness.  Musculoskeletal:        General: No swelling or deformity.     Right lower leg: Edema present.     Left lower leg: Edema present.  Skin:    General: Skin is warm and dry.     Findings: Erythema present.     Comments: Venous stasis changes with blisters Areas of overlying erythema without purulence.  Neurological:     General: No focal deficit present.     Mental Status: Mental status is at baseline.    Labs on Admission: I have personally reviewed following labs and imaging studies  CBC: Recent Labs  Lab 05/21/24 0806  WBC 6.2  NEUTROABS 3.2  HGB 13.5  HCT 44.8  MCV 84.8  PLT 257    Basic Metabolic Panel: Recent Labs  Lab 05/21/24 0806  NA 138  K 4.0  CL 100  CO2 26  GLUCOSE 262*  BUN 19  CREATININE 1.09  CALCIUM  9.2    GFR: CrCl cannot be calculated (Unknown ideal weight.).  Liver Function Tests: Recent Labs  Lab 05/21/24 0806  AST 29  ALT 20  ALKPHOS 90  BILITOT 0.4  PROT 7.4  ALBUMIN  3.6    Urine analysis:    Component Value Date/Time   COLORURINE AMBER (A) 05/26/2023 0400   APPEARANCEUR CLOUDY (A) 05/26/2023 0400   LABSPEC 1.034 (H) 05/26/2023 0400   PHURINE 5.0 05/26/2023 0400   GLUCOSEU 50 (A) 05/26/2023 0400   HGBUR LARGE (A) 05/26/2023 0400   BILIRUBINUR NEGATIVE 05/26/2023 0400   KETONESUR NEGATIVE 05/26/2023 0400   PROTEINUR 100 (A) 05/26/2023 0400   NITRITE NEGATIVE 05/26/2023 0400   LEUKOCYTESUR NEGATIVE 05/26/2023 0400    Radiological Exams on Admission: DG Chest Port 1 View Result Date: 05/21/2024 EXAM: 1 VIEW XRAY OF THE CHEST 05/21/2024 09:40:00 AM COMPARISON: 08/03/2022 CLINICAL HISTORY: Questionable sepsis - evaluate for abnormality FINDINGS: LUNGS AND PLEURA: Low lung volumes. Limited detailed assessment due to habitus. No focal pulmonary opacity. No pleural effusion. No pneumothorax. HEART AND MEDIASTINUM: No acute abnormality of the cardiac and mediastinal silhouettes. BONES AND SOFT TISSUES: No  acute osseous abnormality. IMPRESSION: 1. No acute cardiopulmonary abnormality. Electronically signed by: Waddell Calk MD 05/21/2024 09:49 AM EST RP Workstation: [id number]   EKG: Independently reviewed.  Sinus tachycardia at 110 bpm.  Nonspecific T wave changes.  Low voltage in multiple leads.  Assessment/Plan Active Problems:   Atrial fibrillation, chronic (HCC)   Essential hypertension   Dyslipidemia   Acquired hypothyroidism   Chronic pain disorder   Super-super obese (HCC)   DM2 (diabetes mellitus, type 2) (HCC)   Adrenal insufficiency (HCC) - secondary   Diabetic ulcer of heel (HCC)   Diabetic peripheral neuropathy (HCC)   Anemia   Mild intermittent asthma without complication   Obstructive sleep apnea (adult) (pediatric)   Cellulitis   Cellulitis > Patient reports recurrent right lower extremity cellulitis.  Possible changes on the left as well.  Previously treated for this in the hospital 12/1-12/7. > Worsening skin changes for the last 1 to 2 days.  Has had issues with worsening edema leading to blistering in the setting of venous insufficiency and his morbid obesity due to being unable to get a hospital bed at home so far that will fit him (leading to him spending a lot of time upright/sleeping up right). > No leukocytosis thus far ED the patient is immunosuppressed with chronic prednisone  in setting of adrenal insufficiency. > Started on ceftriaxone  in the ED. - Monitor on telemetry - Continue ceftriaxone  as cellulitis is nonpurulent - Trend fever curve and WBC - Follow-up urinalysis, blood cultures - Supportive care - WOC consult  Hypertension - Continue home diltiazem   Atrial fibrillation - Continue home diltiazem , Xarelto   Hyperlipidemia - Continue home rosuvastatin   Diabetes > Home regimen appears to be Humulin 90-100 units 3 times daily with meals. > During recent admission patient was titrated to 60 units twice daily, 15 units 3 times daily with meals and  SSI just prior to discharge.  Will start there.  Venous insufficiency - Will need to work to keep legs upright - States that he should be able to get the bed he needs delivered to his home on 12/23.  Hypothyroidism - Continue home Synthroid   Adrenal insufficiency - Continue home prednisone  for now, currently normotensive.  Obese - Noted  Neuropathy - Continue home nortriptyline  and Lyrica   Anemia - Hemoglobin currently stable, will trend  Asthma - Continue as needed albuterol   OSA - Continue home CPAP  DVT prophylaxis: Xarelto  Code Status:   Full Family Communication:  None on admission  Disposition Plan:   Patient is from:  Home  Anticipated DC to:  Pending clinical course  Anticipated DC date:  2 to 4 days  Anticipated DC barriers: None  Consults called:  TOC consult for assistance with hospital bed at home if able Admission status:  Inpatient, telemetry  Severity of Illness: The appropriate patient status for this patient is INPATIENT. Inpatient status is judged to be reasonable and necessary in order to provide the required intensity of service to ensure the patient's safety. The patient's presenting symptoms, physical exam findings, and initial radiographic and laboratory data in the context of their chronic comorbidities is felt to place them at high risk for further clinical deterioration. Furthermore, it is not anticipated that the patient will be medically stable for discharge from the hospital within 2 midnights of admission.   * I certify that at the point of admission it is my clinical judgment that the patient will require inpatient hospital care spanning beyond 2 midnights from the point of admission due to high intensity of service, high risk for further deterioration and high frequency of surveillance required.DEWAINE Marsa KATHEE Seena MD [facility]  How to contact the [facility] Attending or Consulting provider 7A - 7P or covering provider during after  hours  7P -7A, for this patient?   Check the care team in [facility] and look for a) attending/consulting TRH provider listed and b) the TRH team listed Log into [email or web address] and use [facility] universal password to access. If you do not have the password, please contact the hospital operator. Locate the TRH provider you are looking for under [facility] and page to a number that you can be directly reached. If you still have difficulty reaching the provider, please page the [facility] (Director on Call) for the Hospitalists listed on amion for assistance.  05/21/2024, 2:39 PM       [1]  Allergies Allergen Reactions   Bee Venom Anaphylaxis    Other reaction(s): Unknown Other reaction(s): Unknown Other reaction(s): Unknown    Sglt2 Inhibitors Other (See Comments)    Necrotizing infection of the perineum   Other Hives   "

## 2024-05-21 NOTE — ED Notes (Signed)
 Lactic acid results to rn issac e.by at

## 2024-05-22 DIAGNOSIS — L03119 Cellulitis of unspecified part of limb: Secondary | ICD-10-CM

## 2024-05-22 LAB — COMPREHENSIVE METABOLIC PANEL WITH GFR
ALT: 18 U/L (ref 0–44)
AST: 25 U/L (ref 15–41)
Albumin: 3.3 g/dL — ABNORMAL LOW (ref 3.5–5.0)
Alkaline Phosphatase: 81 U/L (ref 38–126)
Anion gap: 12 (ref 5–15)
BUN: 12 mg/dL (ref 6–20)
CO2: 27 mmol/L (ref 22–32)
Calcium: 8.6 mg/dL — ABNORMAL LOW (ref 8.9–10.3)
Chloride: 102 mmol/L (ref 98–111)
Creatinine, Ser: 0.96 mg/dL (ref 0.61–1.24)
GFR, Estimated: >60 mL/min
Glucose, Bld: 208 mg/dL — ABNORMAL HIGH (ref 70–99)
Potassium: 4.6 mmol/L (ref 3.5–5.1)
Sodium: 141 mmol/L (ref 135–145)
Total Bilirubin: 0.4 mg/dL (ref 0.0–1.2)
Total Protein: 6.6 g/dL (ref 6.5–8.1)

## 2024-05-22 LAB — URINALYSIS, W/ REFLEX TO CULTURE (INFECTION SUSPECTED)
Bilirubin Urine: NEGATIVE
Glucose, UA: NEGATIVE mg/dL
Ketones, ur: NEGATIVE mg/dL
Leukocytes,Ua: NEGATIVE
Nitrite: NEGATIVE
Protein, ur: NEGATIVE mg/dL
Specific Gravity, Urine: 1.01 (ref 1.005–1.030)
pH: 5.5 (ref 5.0–8.0)

## 2024-05-22 LAB — CBC
HCT: 41.7 % (ref 39.0–52.0)
Hemoglobin: 12.6 g/dL — ABNORMAL LOW (ref 13.0–17.0)
MCH: 25.7 pg — ABNORMAL LOW (ref 26.0–34.0)
MCHC: 30.2 g/dL (ref 30.0–36.0)
MCV: 85.1 fL (ref 80.0–100.0)
Platelets: 227 K/uL (ref 150–400)
RBC: 4.9 MIL/uL (ref 4.22–5.81)
RDW: 17.9 % — ABNORMAL HIGH (ref 11.5–15.5)
WBC: 7.7 K/uL (ref 4.0–10.5)
nRBC: 0 % (ref 0.0–0.2)

## 2024-05-22 LAB — GLUCOSE, CAPILLARY
Glucose-Capillary: 273 mg/dL — ABNORMAL HIGH (ref 70–99)
Glucose-Capillary: 292 mg/dL — ABNORMAL HIGH (ref 70–99)

## 2024-05-22 MED ORDER — INSULIN ASPART 100 UNIT/ML IJ SOLN
0.0000 [IU] | Freq: Three times a day (TID) | INTRAMUSCULAR | Status: DC
  Administered 2024-05-23: 3 [IU] via SUBCUTANEOUS
  Administered 2024-05-23 (×2): 5 [IU] via SUBCUTANEOUS
  Administered 2024-05-24: 2 [IU] via SUBCUTANEOUS
  Administered 2024-05-24: 5 [IU] via SUBCUTANEOUS
  Administered 2024-05-24 – 2024-05-25 (×4): 3 [IU] via SUBCUTANEOUS
  Administered 2024-05-26: 5 [IU] via SUBCUTANEOUS
  Administered 2024-05-26 – 2024-05-27 (×3): 3 [IU] via SUBCUTANEOUS
  Administered 2024-05-27 – 2024-05-28 (×5): 2 [IU] via SUBCUTANEOUS
  Administered 2024-05-29 (×2): 3 [IU] via SUBCUTANEOUS
  Administered 2024-05-30: 5 [IU] via SUBCUTANEOUS
  Administered 2024-05-30: 3 [IU] via SUBCUTANEOUS
  Filled 2024-05-22: qty 2
  Filled 2024-05-22 (×2): qty 3
  Filled 2024-05-22: qty 5
  Filled 2024-05-22: qty 3
  Filled 2024-05-22: qty 2
  Filled 2024-05-22: qty 5
  Filled 2024-05-22: qty 2
  Filled 2024-05-22: qty 5
  Filled 2024-05-22 (×2): qty 2
  Filled 2024-05-22 (×2): qty 3
  Filled 2024-05-22: qty 5

## 2024-05-22 MED ORDER — INSULIN ASPART 100 UNIT/ML IJ SOLN
6.0000 [IU] | Freq: Three times a day (TID) | INTRAMUSCULAR | Status: DC
  Administered 2024-05-23 – 2024-05-27 (×13): 6 [IU] via SUBCUTANEOUS
  Filled 2024-05-22 (×12): qty 6

## 2024-05-22 MED ORDER — INSULIN ASPART 100 UNIT/ML IJ SOLN
0.0000 [IU] | Freq: Every day | INTRAMUSCULAR | Status: DC
  Administered 2024-05-22: 3 [IU] via SUBCUTANEOUS
  Administered 2024-05-23 – 2024-05-26 (×3): 2 [IU] via SUBCUTANEOUS
  Filled 2024-05-22: qty 3
  Filled 2024-05-22 (×3): qty 2

## 2024-05-22 MED ORDER — INSULIN GLARGINE 100 UNIT/ML ~~LOC~~ SOLN
20.0000 [IU] | Freq: Every day | SUBCUTANEOUS | Status: DC
  Administered 2024-05-22 – 2024-05-24 (×3): 20 [IU] via SUBCUTANEOUS
  Filled 2024-05-22 (×4): qty 0.2

## 2024-05-22 NOTE — TOC Initial Note (Addendum)
 Transition of Care [facility]) - Initial/Assessment Note    Patient Details  Name: Carl Hill MRN: [id number] Date of Birth: 1971-01-09  Transition of Care [facility]) CM/SW Contact:    Carl Erminio Deems, RN Phone Number: 05/22/2024, 3:47 PM  Clinical Narrative: Patient presented for recurrent cellulitis. PTA patient reports that he is from home with his mother. Patient has DME rolling walker and bedside commode. Patient is currently active with [facility] for RN Services- PT/OT added and MD is aware to place orders. ICM discussed the referral for a bariatric hospital bed and the patient states he has already purchased a heavy duty queen size bed frame and mattress; no needs for bariatric hospital bed.  Patient will need PTAR for transport home once stable.    Expected Discharge Plan: Home w Home Health Services Barriers to Discharge: Continued Medical Work up   Patient Goals and CMS Choice Patient states their goals for this hospitalization and ongoing recovery are:: Plans to return home with Home Health Services.   Choice offered to / list presented to :  (Currently active with [facility])      Expected Discharge Plan and Services In-house Referral: NA Discharge Planning Services: CM Consult Post Acute Care Choice: Home Health, Resumption of Svcs/PTA Provider Living arrangements for the past 2 months: Single Family Home                   DME Agency: NA       HH Arranged: RN, Disease Management, PT, OT HH Agency: [facility] ([facility]) Date HH Agency Contacted: 05/22/24 Time HH Agency Contacted: 1545 Representative spoke with at [facility] Agency: [facility]  Prior Living Arrangements/Services Living arrangements for the past 2 months: Single Family Home Lives with:: Parents Patient language and need for interpreter reviewed:: Yes        Need for Family Participation in Patient Care: Yes (Comment) Care giver support system in place?: Yes  (comment) Current home services: DME, Home RN (DME rolling walker and bedside commode.) Criminal Activity/Legal Involvement Pertinent to Current Situation/Hospitalization: No - Comment as needed  Activities of Daily Living   ADL Screening (condition at time of admission) Independently performs ADLs?: Yes (appropriate for developmental age) Is the patient deaf or have difficulty hearing?: No Does the patient have difficulty seeing, even when wearing glasses/contacts?: No Does the patient have difficulty concentrating, remembering, or making decisions?: No  Permission Sought/Granted Permission sought to share information with : Case Manager, Magazine Features Editor, Family Supports Permission granted to share information with : Yes, Verbal Permission Granted     Permission granted to share info w AGENCY: [facility]        Emotional Assessment Appearance:: Appears stated age Attitude/Demeanor/Rapport: Engaged Affect (typically observed): Appropriate Orientation: : Oriented to Self, Oriented to Place, Oriented to  Time, Oriented to Situation Alcohol / Substance Use: Not Applicable Psych Involvement: No (comment)  Admission diagnosis:  Cellulitis [L03.90] Cellulitis of right lower extremity [L03.115] Patient Active Problem List   Diagnosis Date Noted   Cellulitis 05/21/2024   Anemia 10/28/2022   Cellulitis of right leg 08/05/2022   Closed fracture of distal fibula - right 08/05/2022   Adrenal insufficiency (HCC) - secondary 05/14/2021   DM2 (diabetes mellitus, type 2) (HCC) 12/10/2020   H/O small bowel obstruction    Muscle spasms of both lower extremities 08/15/2020   Recurrent knee instability, 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   Super-super obese (HCC)    Urinary retention    Diabetic peripheral neuropathy (HCC)  Sacral pain    Debility 08/06/2020   Diabetic ulcer of heel (HCC) 06/10/2020   Atrial fibrillation, chronic (HCC) 06/10/2020   Essential hypertension 06/10/2020    Dyslipidemia 06/10/2020   Acquired hypothyroidism 06/10/2020   Chronic pain disorder 06/10/2020   Morbid obesity with BMI of 60.0-69.9, adult (HCC) 06/10/2020   Chronic venous insufficiency 04/09/2020   Lymphedema 09/14/2019   Mild intermittent asthma without complication 07/15/2015   Bariatric surgery status 05/21/2015   Obstructive sleep apnea (adult) (pediatric) 05/16/2014   PCP:  Carl Joesph DEL, PA-C Pharmacy:   [facility] [address] [place] - [address] [facility] [place] [address] Phone: [phone] Fax: [phone]  [facility] [address] [place] [address] Phone: [phone] Fax: [phone]     Social Drivers of Health (SDOH) Social History: SDOH Screenings   Food Insecurity: No Food Insecurity (05/21/2024)  Housing: Low Risk (05/21/2024)  Transportation Needs: No Transportation Needs (05/21/2024)  Utilities: Not At Risk (05/21/2024)  Financial Resource Strain: Patient Declined (04/23/2024)   Received from [facility]  Physical Activity: Unknown (04/23/2024)   Received from [facility]  Social Connections: Moderately Integrated (04/23/2024)   Received from [facility]  Stress: Patient Declined (04/23/2024)   Received from [facility]  Tobacco Use: High Risk (05/21/2024)   SDOH Interventions:     Readmission Risk Interventions    05/03/2024    4:53 PM  Readmission Risk Prevention Plan  Post Dischage Appt Complete  Medication Screening Complete  Transportation Screening Complete

## 2024-05-22 NOTE — TOC Initial Note (Signed)
 Transition of Care [facility]) - Initial/Assessment Note    Patient Details  Name: Carl Hill MRN: [id number] Date of Birth: 11/01/1970  Transition of Care [facility]) CM/SW Contact:    Jeoffrey LITTIE Moose, [place] Phone Number: 05/22/2024, 9:35 AM  Clinical Narrative:                 Pt admitted from home due to bilateral blisters on legs. No current ICM needs, please consult as needs arise following therapy eval.         Patient Goals and CMS Choice            Expected Discharge Plan and Services                                              Prior Living Arrangements/Services                       Activities of Daily Living   ADL Screening (condition at time of admission) Independently performs ADLs?: Yes (appropriate for developmental age) Is the patient deaf or have difficulty hearing?: No Does the patient have difficulty seeing, even when wearing glasses/contacts?: No Does the patient have difficulty concentrating, remembering, or making decisions?: No  Permission Sought/Granted                  Emotional Assessment              Admission diagnosis:  Cellulitis [L03.90] Cellulitis of right lower extremity [L03.115] Patient Active Problem List   Diagnosis Date Noted   Cellulitis 05/21/2024   Anemia 10/28/2022   Cellulitis of right leg 08/05/2022   Closed fracture of distal fibula - right 08/05/2022   Adrenal insufficiency (HCC) - secondary 05/14/2021   DM2 (diabetes mellitus, type 2) (HCC) 12/10/2020   H/O small bowel obstruction    Muscle spasms of both lower extremities 08/15/2020   Recurrent knee instability, 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   Super-super obese (HCC)    Urinary retention    Diabetic peripheral neuropathy (HCC)    Sacral pain    Debility 08/06/2020   Diabetic ulcer of heel (HCC) 06/10/2020   Atrial fibrillation, chronic (HCC) 06/10/2020   Essential hypertension 06/10/2020   Dyslipidemia 06/10/2020   Acquired hypothyroidism  06/10/2020   Chronic pain disorder 06/10/2020   Morbid obesity with BMI of 60.0-69.9, adult (HCC) 06/10/2020   Chronic venous insufficiency 04/09/2020   Lymphedema 09/14/2019   Mild intermittent asthma without complication 07/15/2015   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 [place] [address] Phone: [phone] Fax: [phone]     Social Drivers of Health (SDOH) Social History: SDOH Screenings   Food Insecurity: No Food Insecurity (05/21/2024)  Housing: Low Risk (05/21/2024)  Transportation Needs: No Transportation Needs (05/21/2024)  Utilities: Not At Risk (05/21/2024)  Financial Resource Strain: Patient Declined (04/23/2024)   Received from [facility]  Physical Activity: Unknown (04/23/2024)   Received from [facility]  Social Connections: Moderately Integrated (04/23/2024)   [facility]  Stress: Patient Declined (04/23/2024)   Received from [facility]  Tobacco Use:  High Risk (05/21/2024)   SDOH Interventions:     Readmission Risk Interventions    05/03/2024    4:53 PM  Readmission Risk Prevention Plan  Post Dischage Appt Complete  Medication Screening Complete  Transportation Screening Complete

## 2024-05-22 NOTE — Progress Notes (Signed)
 " PROGRESS NOTE    Carl Hill  FMW:[id number] DOB: 1971-02-20 DOA: 05/21/2024 PCP: Emilio Joesph DEL, PA-C  Subjective: Patient reports feeling okay, denied any chills. Explained his wounds on his legs that started with a skin tear on Dec 1st, till this admission and his bariatric hospital bed that is to be delivered tomorrow    Hospital Course: 53 y.o. male with medical history significant of hypertension, hyperlipidemia, diabetes, venous insufficiency, atrial fibrillation, hypothyroidism, adrenal insufficiency, obesity, neuropathy, anemia, asthma, OSA, chronic pain presenting with concern for recurrent cellulitis. Patient has chronic issues with recurrent cellulitis and was recently admitted 12/1-12/7 with right lower extremity cellulitis treated initially with IV antibiotics and then transition to p.o. at discharge to complete a 10-day course. His HH nurse was concerned about cellulitis and advised he come to the ED to be evaluated/    Assessment and Plan:  Cellulitis > Patient reports recurrent right lower extremity cellulitis.  Possible changes on the left as well.  Previously treated for this in the hospital 12/1-12/7. > Worsening skin changes for the last 1 to 2 days.  Has had issues with worsening edema leading to blistering in the setting of venous insufficiency and his morbid obesity due to being unable to get a hospital bed at home so far that will fit him (leading to him spending a lot of time upright/sleeping up right). > No leukocytosis thus far ED the patient is immunosuppressed with chronic prednisone  in setting of adrenal insufficiency. - continue ceftriaxone   - WOC follow up    Hypertension - Continue home diltiazem    Atrial fibrillation - Continue home diltiazem , Xarelto    Hyperlipidemia - Continue home rosuvastatin    Diabetes > Home regimen appears to be Humulin 90-100 units 3 times daily with meals. > During recent admission patient was titrated to 60 units  twice daily, 15 units 3 times daily with meals and SSI just prior to discharge.  Will start there and adjust as needed   Venous insufficiency - Will need to work to keep legs upright - States that he should be able to get the bed he needs delivered to his home on 12/23   Hypothyroidism - Continue home Synthroid    Adrenal insufficiency - Continue home prednisone  for now, currently normotensive.   Obese - Noted   Neuropathy - Continue home nortriptyline  and Lyrica    Anemia - Hemoglobin currently stable, will trend   Asthma - Continue as needed albuterol    OSA - Continue home CPAP    DVT prophylaxis:  rivaroxaban  (XARELTO ) tablet 20 mg     Code Status: Full Code Disposition Plan: Home Reason for continuing need for hospitalization: IV antibiotics   Objective: Vitals:   05/21/24 2300 05/22/24 0400 05/22/24 0800 05/22/24 1500  BP: 135/62 132/69 119/63 112/63  Pulse: (!) 107 (!) 109 (!) 111 (!) 110  Resp: 18 18  20   Temp: 98 F (36.7 C) 97.7 F (36.5 C) 98.2 F (36.8 C) 98.6 F (37 C)  TempSrc: Oral Oral Oral   SpO2: 99% 96% 99% 92%  Weight:      Height:        Intake/Output Summary (Last 24 hours) at 05/22/2024 1615 Last data filed at 05/22/2024 0400 Gross per 24 hour  Intake 3 ml  Output 800 ml  Net -797 ml   Filed Weights   05/21/24 2100  Weight: (!) 249 kg    Examination:  Physical Exam Vitals and nursing note reviewed.  Constitutional:  General: He is not in acute distress.    Appearance: He is obese.  Cardiovascular:     Rate and Rhythm: Normal rate.  Pulmonary:     Effort: No respiratory distress.  Abdominal:     General: There is no distension.  Musculoskeletal:     Comments: B/l LE in dressing, not removed. Reviewed photos of wounds on his leg     Data Reviewed: I have personally reviewed following labs and imaging studies  CBC: Recent Labs  Lab 05/21/24 0806 05/22/24 0502  WBC 6.2 7.7  NEUTROABS 3.2  --   HGB 13.5  12.6*  HCT 44.8 41.7  MCV 84.8 85.1  PLT 257 227   Basic Metabolic Panel: Recent Labs  Lab 05/21/24 0806 05/22/24 0502  NA 138 141  K 4.0 4.6  CL 100 102  CO2 26 27  GLUCOSE 262* 208*  BUN 19 12  CREATININE 1.09 0.96  CALCIUM  9.2 8.6*   GFR: Estimated Creatinine Clearance: 196.6 mL/min (by C-G formula based on SCr of 0.96 mg/dL). Liver Function Tests: Recent Labs  Lab 05/21/24 0806 05/22/24 0502  AST 29 25  ALT 20 18  ALKPHOS 90 81  BILITOT 0.4 0.4  PROT 7.4 6.6  ALBUMIN  3.6 3.3*   No results for input(s): LIPASE, AMYLASE in the last 168 hours. No results for input(s): AMMONIA in the last 168 hours. Coagulation Profile: Recent Labs  Lab 05/21/24 1043  INR 1.1   Cardiac Enzymes: No results for input(s): CKTOTAL, CKMB, CKMBINDEX, TROPONINI in the last 168 hours. ProBNP, BNP (last 5 results) Recent Labs    05/21/24 2151  PROBNP 174.0   HbA1C: No results for input(s): HGBA1C in the last 72 hours. CBG: No results for input(s): GLUCAP in the last 168 hours. Lipid Profile: No results for input(s): CHOL, HDL, LDLCALC, TRIG, CHOLHDL, LDLDIRECT in the last 72 hours. Thyroid Function Tests: No results for input(s): TSH, T4TOTAL, FREET4, T3FREE, THYROIDAB in the last 72 hours. Anemia Panel: No results for input(s): VITAMINB12, FOLATE, FERRITIN, TIBC, IRON, RETICCTPCT in the last 72 hours. Sepsis Labs: Recent Labs  Lab 05/21/24 0825 05/21/24 1109  LATICACIDVEN 3.2* 2.0*    Recent Results (from the past 240 hours)  Culture, blood (Routine x 2)     Status: None (Preliminary result)   Collection Time: 05/21/24  8:42 AM   Specimen: BLOOD LEFT FOREARM  Result Value Ref Range Status   Specimen Description BLOOD LEFT FOREARM  Final   Special Requests   Final    BOTTLES DRAWN AEROBIC AND ANAEROBIC Blood Culture results may not be optimal due to an inadequate volume of blood received in culture bottles   Culture    Final    NO GROWTH 1 DAY Performed at [facility], [address], [place], [place] [address]    Report Status PENDING  Incomplete  Culture, blood (Routine x 2)     Status: None (Preliminary result)   Collection Time: 05/21/24  8:47 AM   Specimen: BLOOD LEFT HAND  Result Value Ref Range Status   Specimen Description BLOOD LEFT HAND  Final   Special Requests   Final    BOTTLES DRAWN AEROBIC AND ANAEROBIC Blood Culture adequate volume   Culture   Final    NO GROWTH 1 DAY Performed at [facility], [address], [place], [place] [address]    Report Status PENDING  Incomplete  Blood Culture (routine x 2)     Status: None (Preliminary  result)   Collection Time: 05/21/24  9:51 PM   Specimen: BLOOD  Result Value Ref Range Status   Specimen Description BLOOD SITE NOT SPECIFIED  Final   Special Requests   Final    BOTTLES DRAWN AEROBIC AND ANAEROBIC Blood Culture adequate volume   Culture   Final    NO GROWTH < 12 HOURS Performed at [facility], [address] [place], [place] [address]    Report Status PENDING  Incomplete  Blood Culture (routine x 2)     Status: None (Preliminary result)   Collection Time: 05/21/24 10:04 PM   Specimen: BLOOD  Result Value Ref Range Status   Specimen Description BLOOD SITE NOT SPECIFIED  Final   Special Requests   Final    BOTTLES DRAWN AEROBIC ONLY Blood Culture results may not be optimal due to an inadequate volume of blood received in culture bottles   Culture   Final    NO GROWTH < 12 HOURS Performed at [facility], [address] [place], [place] [address]    Report Status PENDING  Incomplete     Radiology Studies: DG Chest Port 1 View Result Date: 05/21/2024 EXAM: 1 VIEW XRAY OF THE CHEST 05/21/2024 09:40:00 AM COMPARISON: 08/03/2022 CLINICAL HISTORY: Questionable sepsis - evaluate for abnormality FINDINGS: LUNGS AND PLEURA: Low lung volumes. Limited detailed assessment due to habitus. No focal pulmonary  opacity. No pleural effusion. No pneumothorax. HEART AND MEDIASTINUM: No acute abnormality of the cardiac and mediastinal silhouettes. BONES AND SOFT TISSUES: No acute osseous abnormality. IMPRESSION: 1. No acute cardiopulmonary abnormality. Electronically signed by: Taylor Stroud MD 05/21/2024 09:49 AM EST RP Workstation: [id number]    Scheduled Meds:  diltiazem   120 mg Oral Daily   DULoxetine   20 mg Oral BID   levothyroxine   100 mcg Oral Q0600   nortriptyline   75 mg Oral BID   pantoprazole   40 mg Oral Daily   predniSONE   2.5 mg Oral Q breakfast   predniSONE   5 mg Oral QHS   pregabalin   200 mg Oral TID   rivaroxaban   20 mg Oral q AM   rosuvastatin   20 mg Oral QHS   sodium chloride  flush  3 mL Intravenous Q12H   Continuous Infusions:  cefTRIAXone  (ROCEPHIN )  IV 2 g (05/22/24 0837)     LOS: 1 day   Time spent: 40 minutes  Casimer Dare, MD  [facility]  05/22/2024, 4:15 PM   "

## 2024-05-22 NOTE — Plan of Care (Signed)
" °  Problem: Clinical Measurements: Goal: Ability to avoid or minimize complications of infection will improve Outcome: Progressing   Problem: Skin Integrity: Goal: Skin integrity will improve Outcome: Progressing   Problem: Clinical Measurements: Goal: Will remain free from infection Outcome: Progressing   Problem: Pain Managment: Goal: General experience of comfort will improve and/or be controlled Outcome: Progressing   Problem: Safety: Goal: Ability to remain free from injury will improve Outcome: Progressing   Problem: Skin Integrity: Goal: Risk for impaired skin integrity will decrease Outcome: Progressing   "

## 2024-05-22 NOTE — Plan of Care (Signed)
  Problem: Clinical Measurements: Goal: Will remain free from infection Outcome: Progressing   Problem: Activity: Goal: Risk for activity intolerance will decrease Outcome: Progressing   Problem: Skin Integrity: Goal: Risk for impaired skin integrity will decrease Outcome: Progressing   

## 2024-05-22 NOTE — Evaluation (Signed)
 Occupational Therapy Evaluation Patient Details Name: Carl Hill MRN: [id number] DOB: [birth year] Today's Date: 05/22/2024   History of Present Illness   Pt is a 53 y.o. M who presents 05/21/2024 with concern for recurrent cellulitis. Significant PMH: hypertension, hyperlipidemia, diabetes, venous insufficiency, atrial fibrillation, hypothyroidism, adrenal insufficiency, obesity, neuropathy, anemia, asthma, OSA, chronic pain.     Clinical Impressions PTA Pt reports he was Mod I for functional mobility with rollator for limited distances, independent with ADL tasks, and requires light assistance for IADLs. Pt currently requires up to CGA for functional transfers and up to  Max A for LB ADL tasks. Pt primarily limited by decreased activity tolerance, unsteadiness on feet, and pain. OT to continue to follow Pt acutely to facilitate progress towards goals. Recommend HHOT services at d/c to address deficits for safe return home.      If plan is discharge home, recommend the following:   A little help with walking and/or transfers;A little help with bathing/dressing/bathroom;Assistance with cooking/housework;Help with stairs or ramp for entrance;Assist for transportation     Functional Status Assessment   Patient has had a recent decline in their functional status and demonstrates the ability to make significant improvements in function in a reasonable and predictable amount of time.     Equipment Recommendations   None recommended by OT     Recommendations for Other Services         Precautions/Restrictions   Precautions Precautions: Fall Precaution/Restrictions Comments: Watch HR Restrictions Weight Bearing Restrictions Per Provider Order: No     Mobility Bed Mobility Overal bed mobility: Needs Assistance Bed Mobility: Supine to Sit, Sit to Supine     Supine to sit: Supervision, Used rails Sit to supine: Supervision, Used rails   General bed mobility  comments: Supervision for safety with bed inflated. Utilized bed rails and increased time. Able to reposition in supine with BUE support of bed rails    Transfers Overall transfer level: Needs assistance Equipment used: None Transfers: Sit to/from Stand Sit to Stand: Contact guard assist           General transfer comment: Slight sit to stand to optimize position for return to bed, legs not fully extended. Pt requested to defer further mobility until IV line disconnected.      Balance Overall balance assessment: Needs assistance Sitting-balance support: Feet supported Sitting balance-Leahy Scale: Good Sitting balance - Comments: No BUE support                                   ADL either performed or assessed with clinical judgement   ADL Overall ADL's : Needs assistance/impaired Eating/Feeding: Independent   Grooming: Set up;Sitting   Upper Body Bathing: Minimal assistance   Lower Body Bathing: Maximal assistance   Upper Body Dressing : Set up   Lower Body Dressing: Maximal assistance   Toilet Transfer: Contact guard assist;Ambulation;BSC/3in1   Toileting- Clothing Manipulation and Hygiene: Moderate assistance Toileting - Clothing Manipulation Details (indicate cue type and reason): Uses bidet at baseline, reports inability to complete posterior peri care independently at baseline. Sits for urination.       General ADL Comments: Pt reports he is typically independent with LB ADLs, currently below baseline abilities and unable to complete tasks without assistance     Vision Patient Visual Report: No change from baseline Vision Assessment?: No apparent visual deficits     Perception  Praxis         Pertinent Vitals/Pain Pain Assessment Pain Assessment: Faces Pain Score: 9  Pain Location: back, neuropathic pain in legs (chronic) Pain Descriptors / Indicators: Constant, Guarding Pain Intervention(s): Limited activity within patient's  tolerance, Monitored during session     Extremity/Trunk Assessment Upper Extremity Assessment Upper Extremity Assessment: LUE deficits/detail;RUE deficits/detail RUE Deficits / Details: Strength overall WFL, decreased ROM in 4th and 5th digits, pain with ROM of digits. Digits contractured in flexed position at PIP and DIP joints. RUE Sensation: history of peripheral neuropathy RUE Coordination: decreased fine motor LUE Deficits / Details: Strength overall WFL, decreased ROM in 4th and 5th digits, pain with ROM of digits. Digits contractured in flexed position at PIP and DIP joints. LUE Sensation: history of peripheral neuropathy LUE Coordination: decreased fine motor   Lower Extremity Assessment Lower Extremity Assessment: Defer to PT evaluation   Cervical / Trunk Assessment Cervical / Trunk Assessment: Other exceptions Cervical / Trunk Exceptions: large body habitus   Communication Communication Communication: No apparent difficulties   Cognition Arousal: Alert Behavior During Therapy: WFL for tasks assessed/performed Cognition: No apparent impairments             OT - Cognition Comments: Verbose but pleasant and motivated for therapy engagement                 Following commands: Intact       Cueing  General Comments   Cueing Techniques: Verbal cues  VSS on RA. Pt discussed potential of pursuing ALF upon d/c.   Exercises     Shoulder Instructions      Home Living Family/patient expects to be discharged to:: Private residence Living Arrangements: Parent (Mother is in hospital) Available Help at Discharge: Family Type of Home: House Home Access: Ramped entrance                     Home Equipment: Agricultural Consultant (2 wheels);Rollator (4 wheels);Cane - single point;Shower seat;Adaptive equipment Adaptive Equipment: Reacher;Long-handled shoe horn Additional Comments: Uses RW inside, bari Rollator outside      Prior Functioning/Environment Prior  Level of Function : Needs assist;History of Falls (last six months)             Mobility Comments: Walks limited distance from car into house using walker. Uses rollator for mobility out of the home. ADLs Comments: Father assists with dressing changes.Has in-home nurse every Tuesday for wound care. Reports independence with ADLs. Since mother has been in hospital, reports his father and aunt have been visiting and assisting as needed with IADLs.    OT Problem List: Decreased activity tolerance;Impaired balance (sitting and/or standing);Pain;Obesity   OT Treatment/Interventions: Self-care/ADL training;Therapeutic exercise;Energy conservation;DME and/or AE instruction;Therapeutic activities;Patient/family education;Balance training      OT Goals(Current goals can be found in the care plan section)   Acute Rehab OT Goals Patient Stated Goal: to get better OT Goal Formulation: With patient Time For Goal Achievement: 06/05/24 Potential to Achieve Goals: Good ADL Goals Pt Will Perform Grooming: with modified independence;sitting;standing Pt Will Perform Upper Body Bathing: with set-up;with adaptive equipment;sitting Pt Will Perform Lower Body Bathing: with modified independence;with adaptive equipment;sitting/lateral leans Pt Will Perform Lower Body Dressing: with modified independence;with adaptive equipment;sitting/lateral leans Pt Will Transfer to Toilet: with modified independence;ambulating   OT Frequency:  Min 2X/week    Co-evaluation              AM-PAC OT 6 Clicks Daily Activity  Outcome Measure Help from another person eating meals?: None Help from another person taking care of personal grooming?: A Little Help from another person toileting, which includes using toliet, bedpan, or urinal?: A Lot Help from another person bathing (including washing, rinsing, drying)?: A Lot Help from another person to put on and taking off regular upper body clothing?: A  Little Help from another person to put on and taking off regular lower body clothing?: A Lot 6 Click Score: 16   End of Session    Activity Tolerance: Patient tolerated treatment well Patient left: in bed;with call bell/phone within reach  OT Visit Diagnosis: Unsteadiness on feet (R26.81);Pain                Time: 8965-8891 OT Time Calculation (min): 34 min Charges:  OT General Charges $OT Visit: 1 Visit OT Evaluation $OT Eval Low Complexity: 1 Low OT Treatments $Therapeutic Activity: 8-22 mins  Maurilio CROME, OTR/L.  [facility] Acute Rehabilitation  Office: [phone]   Maurilio PARAS Jaionna Weisse 05/22/2024, 12:12 PM

## 2024-05-22 NOTE — Evaluation (Signed)
 Physical Therapy Evaluation Patient Details Name: Carl Hill MRN: [id number] DOB: 12/23/1970 Today's Date: 05/22/2024  History of Present Illness  Pt is a 53 y.o. M who presents 05/21/2024 with concern for recurrent cellulitis. Significant PMH: hypertension, hyperlipidemia, diabetes, venous insufficiency, atrial fibrillation, hypothyroidism, adrenal insufficiency, obesity, neuropathy, anemia, asthma, OSA, chronic pain.  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 Pt seems to be fairly close to his functional baseline. Presents with chronic pain, weakness and decreased cardiopulmonary endurance. Pt ambulating 75 ft with a RW and CGA. HR up to 138 bpm, SpO2 96% on RA. Would benefit from follow up HHPT to address deficits.        If plan is discharge home, recommend the following: A lot of help with bathing/dressing/bathroom;Assistance with cooking/housework;Assist for transportation;Help with stairs or ramp for entrance   Can travel by private vehicle        Equipment Recommendations None recommended by PT  Recommendations for Other Services       Functional Status Assessment Patient has had a recent decline in their functional status and demonstrates the ability to make significant improvements in function in a reasonable and predictable amount of time.     Precautions / Restrictions Precautions Precautions: Fall Precaution/Restrictions Comments: Watch HR Restrictions Weight Bearing Restrictions Per Provider Order: No      Mobility  Bed Mobility Overal bed mobility: Needs Assistance Bed Mobility: Supine to Sit     Supine to sit: Supervision          Transfers Overall transfer level: Needs assistance Equipment used: Rolling walker (2 wheels) Transfers: Sit to/from Stand Sit to Stand: Contact guard assist                Ambulation/Gait Ambulation/Gait assistance: Contact guard assist Gait  Distance (Feet): 75 Feet Assistive device: Rolling walker (2 wheels) Gait Pattern/deviations: Step-through pattern, Decreased stride length, Trunk flexed Gait velocity: decreased Gait velocity interpretation: <1.8 ft/sec, indicate of risk for recurrent falls   General Gait Details: Slow and effortful pace  Stairs            Wheelchair Mobility     Tilt Bed    Modified Rankin (Stroke Patients Only)       Balance Overall balance assessment: Needs assistance Sitting-balance support: Feet supported Sitting balance-Leahy Scale: Good     Standing balance support: Bilateral upper extremity supported, During functional activity, Reliant on assistive device for balance Standing balance-Leahy Scale: Poor                               Pertinent Vitals/Pain Pain Assessment Pain Assessment: Faces Faces Pain Scale: Hurts even more Pain Location: back, neuropathic pain in legs (chronic) Pain Descriptors / Indicators: Constant, Grimacing, Guarding Pain Intervention(s): Limited activity within patient's tolerance, Monitored during session    Home Living Family/patient expects to be discharged to:: Private residence Living Arrangements: Parent Available Help at Discharge: Family Type of Home: House Home Access: Ramped entrance         Home Equipment: Agricultural Consultant (2 wheels);Rollator (4 wheels);Cane - single point;Shower seat Additional Comments: Uses RW inside, bari Rollator outside    Prior Function Prior Level of Function : Needs assist             Mobility Comments: Walks limited distance from car into house using walker ADLs Comments: Father assists with dressing changes  Extremity/Trunk Assessment   Upper Extremity Assessment Upper Extremity Assessment: Defer to OT evaluation    Lower Extremity Assessment Lower Extremity Assessment: Generalized weakness       Communication   Communication Communication: No apparent difficulties     Cognition Arousal: Alert Behavior During Therapy: WFL for tasks assessed/performed   PT - Cognitive impairments: No apparent impairments                       PT - Cognition Comments: Can be verbose Following commands: Intact       Cueing Cueing Techniques: Verbal cues     General Comments      Exercises     Assessment/Plan    PT Assessment Patient needs continued PT services  PT Problem List Decreased strength;Decreased activity tolerance;Decreased balance;Decreased mobility;Pain;Obesity       PT Treatment Interventions DME instruction;Gait training;Functional mobility training;Therapeutic activities;Therapeutic exercise;Balance training;Patient/family education    PT Goals (Current goals can be found in the Care Plan section)  Acute Rehab PT Goals Patient Stated Goal: For wounds to heal PT Goal Formulation: With patient Time For Goal Achievement: 06/05/24 Potential to Achieve Goals: Good    Frequency Min 2X/week     Co-evaluation               AM-PAC PT 6 Clicks Mobility  Outcome Measure Help needed turning from your back to your side while in a flat bed without using bedrails?: None Help needed moving from lying on your back to sitting on the side of a flat bed without using bedrails?: A Little Help needed moving to and from a bed to a chair (including a wheelchair)?: A Little Help needed standing up from a chair using your arms (e.g., wheelchair or bedside chair)?: A Little Help needed to walk in hospital room?: A Little Help needed climbing 3-5 steps with a railing? : A Lot 6 Click Score: 18    End of Session   Activity Tolerance: Patient tolerated treatment well Patient left: in bed;with call bell/phone within reach;Other (comment) (left on RA) Nurse Communication: Mobility status PT Visit Diagnosis: Difficulty in walking, not elsewhere classified (R26.2)    Time: 0820-0859 PT Time Calculation (min) (ACUTE ONLY): 39  min   Charges:   PT Evaluation $PT Eval Low Complexity: 1 Low PT Treatments $Therapeutic Activity: 23-37 mins PT General Charges $$ ACUTE PT VISIT: 1 Visit         Aleck Daring, PT, DPT Acute Rehabilitation Services Office [phone]   Aleck ONEIDA Daring 05/22/2024, 10:36 AM

## 2024-05-23 LAB — GLUCOSE, CAPILLARY
Glucose-Capillary: 269 mg/dL — ABNORMAL HIGH (ref 70–99)
Glucose-Capillary: 255 mg/dL — ABNORMAL HIGH (ref 70–99)
Glucose-Capillary: 237 mg/dL — ABNORMAL HIGH (ref 70–99)
Glucose-Capillary: 233 mg/dL — ABNORMAL HIGH (ref 70–99)

## 2024-05-23 LAB — HEMOGLOBIN A1C
Hgb A1c MFr Bld: 8.7 % — ABNORMAL HIGH (ref 4.8–5.6)
Mean Plasma Glucose: 202.99 mg/dL

## 2024-05-23 MED ORDER — OYSTER SHELL CALCIUM/D3 500-5 MG-MCG PO TABS
1.0000 | ORAL_TABLET | Freq: Every day | ORAL | Status: DC
  Administered 2024-05-24 – 2024-05-30 (×7): 1 via ORAL
  Filled 2024-05-23 (×6): qty 1

## 2024-05-23 MED ORDER — CEFPODOXIME PROXETIL 100 MG/5ML PO SUSR: 200.0000 mg | Freq: Two times a day (BID) | ORAL | Status: DC

## 2024-05-23 MED ORDER — ADULT MULTIVITAMIN W/MINERALS CH
1.0000 | ORAL_TABLET | Freq: Every day | ORAL | Status: DC
  Administered 2024-05-24 – 2024-05-30 (×7): 1 via ORAL
  Filled 2024-05-23 (×6): qty 1

## 2024-05-23 MED ORDER — FUROSEMIDE 10 MG/ML IJ SOLN
40.0000 mg | Freq: Once | INTRAMUSCULAR | Status: AC
  Administered 2024-05-23: 40 mg via INTRAVENOUS
  Filled 2024-05-23: qty 4

## 2024-05-23 MED ORDER — CEFADROXIL 500 MG PO CAPS
500.0000 mg | ORAL_CAPSULE | Freq: Two times a day (BID) | ORAL | Status: AC
  Administered 2024-05-24 – 2024-05-27 (×8): 500 mg via ORAL
  Filled 2024-05-23 (×8): qty 1

## 2024-05-23 NOTE — Progress Notes (Signed)
 " PROGRESS NOTE    Carl Hill  FMW:[id number] DOB: 1971-04-06 DOA: 05/21/2024 PCP: Emilio Joesph DEL, PA-C  Subjective: Patient reports feeling okay, states his neuropathy is worse in the hospital. He states his new bariatric hospital bed is scheduled to be delivered today. He states his legs feel better, dressings were changed this morning. He mentions that he does drink a lot of fluid at home   Hospital Course: 53 y.o. male with medical history significant of hypertension, hyperlipidemia, diabetes, venous insufficiency, atrial fibrillation, hypothyroidism, adrenal insufficiency, obesity, neuropathy, anemia, asthma, OSA, chronic pain presenting with concern for recurrent cellulitis. Patient has chronic issues with recurrent cellulitis and was recently admitted 12/1-12/7 with right lower extremity cellulitis treated initially with IV antibiotics and then transition to p.o. at discharge to complete a 10-day course. His HH nurse was concerned about cellulitis and advised he come to the ED to be evaluated    Assessment and Plan:  Cellulitis > Patient reports recurrent right lower extremity cellulitis.  Possible changes on the left as well.  Previously treated for this in the hospital 12/1-12/7. > Has had issues with worsening edema leading to blistering in the setting of venous insufficiency and his morbid obesity due to being unable to get a hospital bed at home so far that will fit him (leading to him spending a lot of time upright/sleeping up right). > No leukocytosis thus far ED the patient is immunosuppressed with chronic prednisone  in setting of adrenal insufficiency. - continue wound care - will change Abx to PO (vantin ) to complete 7 days    Hypertension - Continue home diltiazem    Atrial fibrillation - Continue home diltiazem , Xarelto    Hyperlipidemia - Continue home rosuvastatin    Diabetes > Home regimen appears to be Humulin 90-100 units 3 times daily with meals. > started  on a lower dose, monitor Bgs and titrate as appropriate    Venous insufficiency - Will need to work to keep legs upright - States that he should be able to get the bed he needs delivered to his home on 12/23   Hypothyroidism - Continue home Synthroid    Adrenal insufficiency - Continue home prednisone  for now, currently normotensive.   Obese - Noted   Neuropathy - Continue home nortriptyline  and Lyrica    Anemia - Hemoglobin currently stable, will trend   Asthma - Continue as needed albuterol    OSA - Continue home CPAP    DVT prophylaxis:  rivaroxaban  (XARELTO ) tablet 20 mg    Code Status: Full Code Disposition Plan: Home Reason for continuing need for hospitalization: Plan for discharge tomorrow   Objective: Vitals:   05/22/24 1708 05/22/24 2107 05/23/24 0457 05/23/24 0949  BP: (!) 120/56 115/62 134/73 137/77  Pulse: (!) 108 (!) 107 (!) 106 (!) 107  Resp: 19 18 18 18   Temp: 98 F (36.7 C) 98.7 F (37.1 C) 97.9 F (36.6 C) 97.6 F (36.4 C)  TempSrc: Oral Oral Oral Oral  SpO2: 95% 95% 95% 92%  Weight:      Height:        Intake/Output Summary (Last 24 hours) at 05/23/2024 1550 Last data filed at 05/23/2024 1200 Gross per 24 hour  Intake 829 ml  Output --  Net 829 ml   Filed Weights   05/21/24 2100  Weight: (!) 249 kg    Examination:  Physical Exam  Data Reviewed: I have personally reviewed following labs and imaging studies  CBC: Recent Labs  Lab 05/21/24 0806 05/22/24 0502  WBC  6.2 7.7  NEUTROABS 3.2  --   HGB 13.5 12.6*  HCT 44.8 41.7  MCV 84.8 85.1  PLT 257 227   Basic Metabolic Panel: Recent Labs  Lab 05/21/24 0806 05/22/24 0502  NA 138 141  K 4.0 4.6  CL 100 102  CO2 26 27  GLUCOSE 262* 208*  BUN 19 12  CREATININE 1.09 0.96  CALCIUM  9.2 8.6*   GFR: Estimated Creatinine Clearance: 196.6 mL/min (by C-G formula based on SCr of 0.96 mg/dL). Liver Function Tests: Recent Labs  Lab 05/21/24 0806 05/22/24 0502  AST 29 25   ALT 20 18  ALKPHOS 90 81  BILITOT 0.4 0.4  PROT 7.4 6.6  ALBUMIN  3.6 3.3*   No results for input(s): LIPASE, AMYLASE in the last 168 hours. No results for input(s): AMMONIA in the last 168 hours. Coagulation Profile: Recent Labs  Lab 05/21/24 1043  INR 1.1   Cardiac Enzymes: No results for input(s): CKTOTAL, CKMB, CKMBINDEX, TROPONINI in the last 168 hours. ProBNP, BNP (last 5 results) Recent Labs    05/21/24 2151  PROBNP 174.0   HbA1C: No results for input(s): HGBA1C in the last 72 hours. CBG: Recent Labs  Lab 05/22/24 1800 05/22/24 2152 05/23/24 0734 05/23/24 1156  GLUCAP 273* 292* 269* 255*   Lipid Profile: No results for input(s): CHOL, HDL, LDLCALC, TRIG, CHOLHDL, LDLDIRECT in the last 72 hours. Thyroid Function Tests: No results for input(s): TSH, T4TOTAL, FREET4, T3FREE, THYROIDAB in the last 72 hours. Anemia Panel: No results for input(s): VITAMINB12, FOLATE, FERRITIN, TIBC, IRON, RETICCTPCT in the last 72 hours. Sepsis Labs: Recent Labs  Lab 05/21/24 0825 05/21/24 1109  LATICACIDVEN 3.2* 2.0*    Recent Results (from the past 240 hours)  Culture, blood (Routine x 2)     Status: None (Preliminary result)   Collection Time: 05/21/24  8:42 AM   Specimen: BLOOD LEFT FOREARM  Result Value Ref Range Status   Specimen Description BLOOD LEFT FOREARM  Final   Special Requests   Final    BOTTLES DRAWN AEROBIC AND ANAEROBIC Blood Culture results may not be optimal due to an inadequate volume of blood received in culture bottles   Culture   Final    NO GROWTH 2 DAYS Performed at [facility], [address], [place], [place] [address]    Report Status PENDING  Incomplete  Culture, blood (Routine x 2)     Status: None (Preliminary result)   Collection Time: 05/21/24  8:47 AM   Specimen: BLOOD LEFT HAND  Result Value Ref Range Status   Specimen Description BLOOD LEFT HAND  Final   Special Requests    Final    BOTTLES DRAWN AEROBIC AND ANAEROBIC Blood Culture adequate volume   Culture   Final    NO GROWTH 2 DAYS Performed at [facility], [address], [place], [place] [address]    Report Status PENDING  Incomplete  Blood Culture (routine x 2)     Status: None (Preliminary result)   Collection Time: 05/21/24  9:51 PM   Specimen: BLOOD  Result Value Ref Range Status   Specimen Description BLOOD SITE NOT SPECIFIED  Final   Special Requests   Final    BOTTLES DRAWN AEROBIC AND ANAEROBIC Blood Culture adequate volume   Culture   Final    NO GROWTH 2 DAYS Performed at [facility], [address] [place], [place] [address]    Report Status PENDING  Incomplete  Blood Culture (routine x 2)     Status: None (Preliminary result)   Collection Time: 05/21/24 10:04 PM   Specimen: BLOOD  Result Value Ref Range Status   Specimen Description BLOOD SITE NOT SPECIFIED  Final   Special Requests   Final    BOTTLES DRAWN AEROBIC ONLY Blood Culture results may not be optimal due to an inadequate volume of blood received in culture bottles   Culture   Final    NO GROWTH 2 DAYS Performed at [facility], [address], [place], [place] [address]    Report Status PENDING  Incomplete     Radiology Studies: No results found.  Scheduled Meds:  [START ON 05/24/2024] calcium -vitamin D   1 tablet Oral Q breakfast   diltiazem   120 mg Oral Daily   DULoxetine   20 mg Oral BID   insulin  aspart  0-5 Units Subcutaneous QHS   insulin  aspart  0-9 Units Subcutaneous TID WC   insulin  aspart  6 Units Subcutaneous TID WC   insulin  glargine  20 Units Subcutaneous QHS   levothyroxine   100 mcg Oral Q0600   [START ON 05/24/2024] multivitamin with minerals  1 tablet Oral Daily   nortriptyline   75 mg Oral BID   pantoprazole   40 mg Oral Daily   predniSONE   2.5 mg Oral Q breakfast   predniSONE   5 mg Oral QHS   pregabalin   200 mg Oral TID   rivaroxaban   20 mg Oral q AM   rosuvastatin   20 mg  Oral QHS   sodium chloride  flush  3 mL Intravenous Q12H   Continuous Infusions:  cefTRIAXone  (ROCEPHIN )  IV 2 g (05/23/24 0833)     LOS: 2 days   Time spent: 40 minutes  Casimer Dare, MD  [facility]  05/23/2024, 3:50 PM   "

## 2024-05-23 NOTE — Progress Notes (Signed)
 Physical Therapy Treatment Patient Details Name: Carl Hill MRN: [id number] DOB: Apr 24, 1971 Today's Date: 05/23/2024   History of Present Illness Pt is a 53 y.o. M who presents 05/21/2024 with concern for recurrent cellulitis. Significant PMH: hypertension, hyperlipidemia, diabetes, venous insufficiency, atrial fibrillation, hypothyroidism, adrenal insufficiency, obesity, neuropathy, anemia, asthma, OSA, chronic pain.    PT Comments  Pt making steady progress towards his physical therapy goals this session. Able to participate in warm up exercise and ambulating 120 ft with a RW and CGA. HR up to 141 bpm. HHPT remains appropriate.    If plan is discharge home, recommend the following: A lot of help with bathing/dressing/bathroom;Assistance with cooking/housework;Assist for transportation;Help with stairs or ramp for entrance   Can travel by private vehicle        Equipment Recommendations  None recommended by PT    Recommendations for Other Services       Precautions / Restrictions Precautions Precautions: Fall Precaution/Restrictions Comments: Watch HR Restrictions Weight Bearing Restrictions Per Provider Order: No     Mobility  Bed Mobility Overal bed mobility: Needs Assistance Bed Mobility: Supine to Sit, Sit to Supine     Supine to sit: Supervision Sit to supine: Supervision        Transfers Overall transfer level: Needs assistance Equipment used: Rolling walker (2 wheels) Transfers: Sit to/from Stand Sit to Stand: Contact guard assist                Ambulation/Gait Ambulation/Gait assistance: Contact guard assist Gait Distance (Feet): 120 Feet Assistive device: Rolling walker (2 wheels) Gait Pattern/deviations: Step-through pattern, Decreased stride length, Trunk flexed Gait velocity: decreased     General Gait Details: Slow and effortful pace   Stairs             Wheelchair Mobility     Tilt Bed    Modified Rankin (Stroke  Patients Only)       Balance Overall balance assessment: Needs assistance Sitting-balance support: Feet supported Sitting balance-Leahy Scale: Good     Standing balance support: Bilateral upper extremity supported, During functional activity, Reliant on assistive device for balance Standing balance-Leahy Scale: Poor                              Communication Communication Communication: No apparent difficulties  Cognition Arousal: Alert Behavior During Therapy: WFL for tasks assessed/performed   PT - Cognitive impairments: No apparent impairments                       PT - Cognition Comments: Can be verbose Following commands: Intact      Cueing Cueing Techniques: Verbal cues  Exercises General Exercises - Upper Extremity Shoulder Flexion: AROM, Both, 10 reps, Seated General Exercises - Lower Extremity Ankle Circles/Pumps: AROM, Both, 20 reps, Seated Long Arc Quad: AROM, Both, 5 reps, Seated    General Comments        Pertinent Vitals/Pain Pain Assessment Pain Assessment: Faces Faces Pain Scale: Hurts even more Pain Location: neuropathic pain forearms, hands Pain Descriptors / Indicators: Constant, Grimacing, Guarding Pain Intervention(s): Monitored during session    Home Living                          Prior Function            PT Goals (current goals can now be found in the care plan section) Acute Rehab  PT Goals Patient Stated Goal: For wounds to heal PT Goal Formulation: With patient Time For Goal Achievement: 06/05/24 Potential to Achieve Goals: Good Progress towards PT goals: Progressing toward goals    Frequency    Min 2X/week      PT Plan      Co-evaluation              AM-PAC PT 6 Clicks Mobility   Outcome Measure  Help needed turning from your back to your side while in a flat bed without using bedrails?: None Help needed moving from lying on your back to sitting on the side of a flat bed  without using bedrails?: A Little Help needed moving to and from a bed to a chair (including a wheelchair)?: A Little Help needed standing up from a chair using your arms (e.g., wheelchair or bedside chair)?: A Little Help needed to walk in hospital room?: A Little Help needed climbing 3-5 steps with a railing? : A Lot 6 Click Score: 18    End of Session Equipment Utilized During Treatment: Gait belt Activity Tolerance: Patient tolerated treatment well Patient left: in bed;with call bell/phone within reach Nurse Communication: Mobility status PT Visit Diagnosis: Difficulty in walking, not elsewhere classified (R26.2)     Time: 1113-1140 PT Time Calculation (min) (ACUTE ONLY): 27 min  Charges:    $Therapeutic Activity: 23-37 mins PT General Charges $$ ACUTE PT VISIT: 1 Visit                     Carl Hill, PT, DPT Acute Rehabilitation Services Office [phone]    Carl Hill 05/23/2024, 11:43 AM

## 2024-05-24 DIAGNOSIS — L03115 Cellulitis of right lower limb: Secondary | ICD-10-CM | POA: Diagnosis not present

## 2024-05-24 DIAGNOSIS — E1142 Type 2 diabetes mellitus with diabetic polyneuropathy: Secondary | ICD-10-CM | POA: Diagnosis not present

## 2024-05-24 DIAGNOSIS — I10 Essential (primary) hypertension: Secondary | ICD-10-CM | POA: Diagnosis not present

## 2024-05-24 LAB — GLUCOSE, CAPILLARY
Glucose-Capillary: 226 mg/dL — ABNORMAL HIGH (ref 70–99)
Glucose-Capillary: 252 mg/dL — ABNORMAL HIGH (ref 70–99)
Glucose-Capillary: 187 mg/dL — ABNORMAL HIGH (ref 70–99)
Glucose-Capillary: 219 mg/dL — ABNORMAL HIGH (ref 70–99)

## 2024-05-24 NOTE — Progress Notes (Signed)
 Patient refused Cpap for the night. Stated he doesn't wear at home due to not being able to tolerate. States he only uses O2 at night so a nasal cannula @ 2L was placed on patient

## 2024-05-24 NOTE — Hospital Course (Addendum)
 Carl Hill is a 53 y.o. male with PMH of  of hypertension, hyperlipidemia, diabetes, venous insufficiency, atrial fibrillation, hypothyroidism, adrenal insufficiency, obesity, neuropathy, anemia, asthma, OSA, chronic pain presenting with concern for recurrent cellulitis.Patient has chronic issues with recurrent cellulitis and was recently admitted 12/1-12/7 with right lower extremity cellulitis treated initially with IV antibiotics and then transition to p.o. at discharge to complete a 10-day course. His HH nurse was concerned about cellulitis and advised he come to the ED to be evaluated  AND patient was admitted for further management of cellulitis Overall no leukocytosis, suspect in the setting of immunosuppression and chronic prednisone  with adrenal insufficiency and her worsening edema leading to recurrent cellulitis, at this time patient medically stable will plan for discharge on oral antibiotics x 7 days Blood culture 12/21 NGTD Patient continueD on oral antibiotics, local compression wound care leg elevation and overall improving. Initially planning for SNF but at this time patient is deciding to go home with home health with support from family   Subjective: Seen and examined  He feels well and agree for discharge to home today Afebrile vital stable blood sugar borderline He feels his leg wound has significantly improved  Discharge Diagnoses:   Bilateral leg Cellulitis, recurrent Chronic venous insufficiency: Patient reports recurrent right lower extremity cellulitis.  Possible changes on the left as well.  Previously treated for this in the hospital 12/1-12/7.Has had issues with worsening edema leading to blistering in the setting of venous insufficiency and his morbid obesity due to being unable to get a hospital bed at home so far that will fit him (leading to him spending a lot of time upright/sleeping up right). Overall no leukocytosis no fever.patient is immunosuppressed with  chronic prednisone  in setting of adrenal insufficiency. Will continue on cefadroxil  and doxycycline  along w/ wound care and lasix  -wound care nurse further evaluated for discharge with recommendation and follow-up with wound clinic . It appears patient does not have much help at home since his mother moved to house his father will not be able to come to the house to assist with wound care given difficult social situation at home. TOC working ?SNF vs Home HH   Hypertension HLD: Stable, continue home diltiazem .  Continue Crestor    Atrial fibrillation Rate controlled, continue home diltiazem , Xarelto     Diabetes melitis on long-term insulin  with uncontrolled hyperglycemia: PTA on  Humulin 90-100 u tid w/ meals.poorly controlled due to regular diet changed to carb diet.Insulin  dosing adjusted BUT ON DC he will resume home insulin  and monjourao. Lab Results  Component Value Date   HGBA1C 8.7 (H) 05/23/2024    Recent Labs  Lab 05/29/24 0759 05/29/24 1232 05/29/24 1739 05/29/24 2109 05/30/24 0753  GLUCAP 228* 185* 216* 172* 268*    Hypothyroidism Continue home Synthroid    Adrenal insufficiency Continue home prednisone  for now, currently normotensive.  Neuropathy Continue home nortriptyline  and Lyrica   Chronic anemia Hemoglobin currently stable, will trend   Asthma Stable,Continue as needed albuterol    Morbid obesity with Body mass index is 63.44 kg/m.: Will benefit with PCP follow-up, weight loss,.  Uses oxygen nocturnally not using CPAP  Mobility: PT Orders: Active PT Follow up Rec: Home Health Pt12/29/2025 1548   DVT prophylaxis: On Xarelto  Code Status:   Code Status: Full Code Family Communication: plan of care discussed with patient at bedside. Patient status is: Remains hospitalized because of severity of illness Level of care: Telemetry   Dispo: The patient is from: home  Anticipated disposition: Awaiting an SNF  Objective: Vitals last 24  hrs: Vitals:   05/29/24 1857 05/29/24 2113 05/30/24 0534 05/30/24 0912  BP: 126/63 132/75 (!) 154/77 (!) 151/80  Pulse: 100 95 94 99  Resp: 16 17 19 17   Temp: 97.9 F (36.6 C) 97.7 F (36.5 C) 98.4 F (36.9 C) 98 F (36.7 C)  TempSrc: Oral Oral    SpO2: 96% 99% 91% 90%  Weight:      Height:       Physical Examination: General exam: AAOX3 HEENT:Oral mucosa moist, Ear/Nose WNL grossly Respiratory system: Bilaterally clear, nad Cardiovascular system: S1 & S2 +, No JVD. Gastrointestinal system: Abdomen soft, Obese, BS + Nervous System: Alert, awake, moving all extremities Extremities: extremities warm, chronic lymphedema w/ improving swelling erythema  on legs Skin: Warm, no rashes MSK: Normal muscle bulk,tone, power

## 2024-05-24 NOTE — Progress Notes (Signed)
 Mobility Specialist Progress Note:   05/24/24 1421  Mobility  Activity Ambulated with assistance (In hallway)  Level of Assistance Standby assist, set-up cues, supervision of patient - no hands on  Assistive Device Front wheel walker  Distance Ambulated (ft) 140 ft  Activity Response Tolerated well  Mobility Referral Yes  Mobility visit 1 Mobility  Mobility Specialist Start Time (ACUTE ONLY) 1421  Mobility Specialist Stop Time (ACUTE ONLY) 1448  Mobility Specialist Time Calculation (min) (ACUTE ONLY) 27 min   Received pt in bed and agreeable to mobility. Pt required no physical assistance. Pt's max HR was 142 bpm after ambulation. C/o BLE discomfort, otherwise tolerated well. Returned to room without fault. Left pt in bed. Personal belongings and call light within reach. All needs met.  Carl Hill Mobility Specialist  Please contact via [employer] or  Rehab Office [phone]

## 2024-05-24 NOTE — Progress Notes (Signed)
 " PROGRESS NOTE Carl Hill  FMW:[id number] DOB: 1970/11/23 DOA: 05/21/2024 PCP: Emilio Joesph DEL, PA-C  Brief Narrative/Hospital Course: Carl Hill is a 53 y.o. male with PMH of  of hypertension, hyperlipidemia, diabetes, venous insufficiency, atrial fibrillation, hypothyroidism, adrenal insufficiency, obesity, neuropathy, anemia, asthma, OSA, chronic pain presenting with concern for recurrent cellulitis.Patient has chronic issues with recurrent cellulitis and was recently admitted 12/1-12/7 with right lower extremity cellulitis treated initially with IV antibiotics and then transition to p.o. at discharge to complete a 10-day course. His HH nurse was concerned about cellulitis and advised he come to the ED to be evaluated  AND patient was admitted for further management of cellulitis Overall no leukocytosis, suspect in the setting of immunosuppression and chronic prednisone  with adrenal insufficiency and her worsening edema leading to recurrent cellulitis, at this time patient medically stable will plan for discharge on oral antibiotics x 7 days Blood culture 12/21 NGTD  Subjective: Seen and examined today No new complaints some pain with neuropathy in his hands Overnight on 2 L nocturnally which is baseline at home, afebrile, VSS, Labs blood sugar in the 20s, last lab 12/22 stable CMP and CBC with hypoalbuminemia A1c 8.7 Refused CPAP for the night-does not use CPAP normally   Assessment and plan:  Cellulitis, recurrent: Patient reports recurrent right lower extremity cellulitis.  Possible changes on the left as well.  Previously treated for this in the hospital 12/1-12/7.Has had issues with worsening edema leading to blistering in the setting of venous insufficiency and his morbid obesity due to being unable to get a hospital bed at home so far that will fit him (leading to him spending a lot of time upright/sleeping up right). Overall no leukocytosis no fever.patient is  immunosuppressed with chronic prednisone  in setting of adrenal insufficiency. Continue with wound care as instructed along with oral antibiotics. It appears patient does not have much help at home since his mother moved to house his father will not be able to come to the house to assist with wound care given lack of good tone among family Patient wondering about SNF placement TOC notified   Hypertension Stable, continue home diltiazem    Atrial fibrillation Rate controlled, continue home diltiazem , Xarelto    Hyperlipidemia Continue home rosuvastatin    Diabetes melitis on long-term insulin  with uncontrolled hyperglycemia: PTA on  Humulin 90-100 units 3 times daily with meals continue descension Continue lower dose as ordered and monitoring Recent Labs  Lab 05/23/24 0502 05/23/24 0734 05/23/24 1156 05/23/24 1650 05/23/24 2047 05/24/24 0802  GLUCAP  --  269* 255* 237* 233* 226*  HGBA1C 8.7*  --   --   --   --   --     Venous insufficiency Continue to elevate the leg, counseled, continue dressing changes   Hypothyroidism Continue home Synthroid    Adrenal insufficiency Continue home prednisone  for now, currently normotensive.  Neuropathy Continue home nortriptyline  and Lyrica   Chronic anemia Hemoglobin currently stable, will trend   Asthma Stable,Continue as needed albuterol    Morbid obesity with Body mass index is 63.44 kg/m.: Will benefit with PCP follow-up, weight loss,.  Uses oxygen nocturnally not using CPAP  Mobility: PT Orders: Active PT Follow up Rec: Home Health Pt12/23/2025 1142   DVT prophylaxis: On Xarelto  Code Status:   Code Status: Full Code Family Communication: plan of care discussed with patient at bedside. Patient status is: Remains hospitalized because of severity of illness Level of care: Telemetry   Dispo: The patient is from: home  Anticipated disposition: TBD Objective: Vitals last 24 hrs: Vitals:   05/23/24 0949 05/23/24  1726 05/23/24 2046 05/24/24 0554  BP: 137/77 128/78 128/69 (!) 145/83  Pulse: (!) 107 (!) 103 (!) 106 (!) 101  Resp: 18 18 18 18   Temp: 97.6 F (36.4 C) 98.4 F (36.9 C) 98.1 F (36.7 C) 97.9 F (36.6 C)  TempSrc: Oral Oral Oral Oral  SpO2: 92% 95% 94% 97%  Weight:      Height:        Physical Examination: General exam: alert awake, oriented, older than stated age HEENT:Oral mucosa moist, Ear/Nose WNL grossly Respiratory system: Bilaterally clear BS,no use of accessory muscle Cardiovascular system: S1 & S2 +, No JVD. Gastrointestinal system: Abdomen soft,NT,ND, BS+ Nervous System: Alert, awake, moving all extremities,and following commands. Extremities: extremities warm, leg edema + w/ extensive erythema Skin: Warm, no rashes MSK: Normal muscle bulk,tone, power   Medications reviewed:  Scheduled Meds:  calcium -vitamin D   1 tablet Oral Q breakfast   cefadroxil   500 mg Oral BID   diltiazem   120 mg Oral Daily   DULoxetine   20 mg Oral BID   insulin  aspart  0-5 Units Subcutaneous QHS   insulin  aspart  0-9 Units Subcutaneous TID WC   insulin  aspart  6 Units Subcutaneous TID WC   insulin  glargine  20 Units Subcutaneous QHS   levothyroxine   100 mcg Oral Q0600   multivitamin with minerals  1 tablet Oral Daily   nortriptyline   75 mg Oral BID   pantoprazole   40 mg Oral Daily   predniSONE   2.5 mg Oral Q breakfast   predniSONE   5 mg Oral QHS   pregabalin   200 mg Oral TID   rivaroxaban   20 mg Oral q AM   rosuvastatin   20 mg Oral QHS   sodium chloride  flush  3 mL Intravenous Q12H   Continuous Infusions: Diet: Diet Order             Diet regular Room service appropriate? Yes; Fluid consistency: Thin; Fluid restriction: 2000 mL Fluid  Diet effective now                    Data Reviewed: I have personally reviewed following labs and imaging studies ( see epic result tab) CBC: Recent Labs  Lab 05/21/24 0806 05/22/24 0502  WBC 6.2 7.7  NEUTROABS 3.2  --   HGB 13.5  12.6*  HCT 44.8 41.7  MCV 84.8 85.1  PLT 257 227   CMP: Recent Labs  Lab 05/21/24 0806 05/22/24 0502  NA 138 141  K 4.0 4.6  CL 100 102  CO2 26 27  GLUCOSE 262* 208*  BUN 19 12  CREATININE 1.09 0.96  CALCIUM  9.2 8.6*   GFR: Estimated Creatinine Clearance: 196.6 mL/min (by C-G formula based on SCr of 0.96 mg/dL). Recent Labs  Lab 05/21/24 0806 05/22/24 0502  AST 29 25  ALT 20 18  ALKPHOS 90 81  BILITOT 0.4 0.4  PROT 7.4 6.6  ALBUMIN  3.6 3.3*   No results for input(s): LIPASE, AMYLASE in the last 168 hours. No results for input(s): AMMONIA in the last 168 hours. Coagulation Profile:  Recent Labs  Lab 05/21/24 1043  INR 1.1   Unresulted Labs (From admission, onward)    None      Antimicrobials/Microbiology: Anti-infectives (From admission, onward)    Start     Dose/Rate Route Frequency Ordered Stop   05/24/24 1000  cefpodoxime  (VANTIN ) 100 MG/5ML suspension 200 mg  Status:  Discontinued        200 mg Oral Every 12 hours 05/23/24 1551 05/23/24 1610   05/24/24 1000  cefadroxil  (DURICEF) capsule 500 mg        500 mg Oral 2 times daily 05/23/24 1610 05/28/24 0959   05/22/24 1000  cefTRIAXone  (ROCEPHIN ) 2 g in sodium chloride  0.9 % 100 mL IVPB  Status:  Discontinued        2 g 200 mL/hr over 30 Minutes Intravenous Every 24 hours 05/21/24 1348 05/23/24 1551   05/21/24 0945  cefTRIAXone  (ROCEPHIN ) 2 g in sodium chloride  0.9 % 100 mL IVPB        2 g 200 mL/hr over 30 Minutes Intravenous  Once 05/21/24 0930 05/21/24 1112         Component Value Date/Time   SDES BLOOD SITE NOT SPECIFIED 05/21/2024 2204   SPECREQUEST  05/21/2024 2204    BOTTLES DRAWN AEROBIC ONLY Blood Culture results may not be optimal due to an inadequate volume of blood received in culture bottles   CULT  05/21/2024 2204    NO GROWTH 2 DAYS Performed at [facility], [address] [place], [place] [address]    REPTSTATUS PENDING 05/21/2024 2204    Procedures:    Carl LAMY,  MD [facility] 05/24/2024, 11:11 AM   "

## 2024-05-24 NOTE — TOC Progression Note (Signed)
 Transition of Care [facility]) - Progression Note    Patient Details  Name: Carl Hill MRN: [id number] Date of Birth: 1970-12-20  Transition of Care [facility] CM/SW Contact  Cashmere Dingley LITTIE Moose, [place] Phone Number: 05/24/2024, 2:05 PM  Clinical Narrative:    CSW completed Fl2 and sent SNF referrals. CSW will follow up to provide bed offers.   Expected Discharge Plan: Skilled Nursing Facility Barriers to Discharge: Continued Medical Work up, English As A Second Language Teacher, SNF Pending bed offer               Expected Discharge Plan and Services In-house Referral: NA Discharge Planning Services: CM Consult Post Acute Care Choice: Home Health, Resumption of Svcs/PTA Provider Living arrangements for the past 2 months: Single Family Home                   DME Agency: NA       HH Arranged: RN, Disease Management, PT, OT HH Agency: [facility] Date HH Agency Contacted: 05/22/24 Time HH Agency Contacted: 1545 Representative spoke with at [facility] Agency: [facility]   Social Drivers of Health (SDOH) Interventions SDOH Screenings   Food Insecurity: No Food Insecurity (05/21/2024)  Housing: Low Risk (05/21/2024)  Transportation Needs: No Transportation Needs (05/21/2024)  Utilities: Not At Risk (05/21/2024)  Financial Resource Strain: Patient Declined (04/23/2024)   Received from [facility]  Physical Activity: Unknown (04/23/2024)   Received from [facility]  Social Connections: Moderately Integrated (04/23/2024)   Received from [facility]  Stress: Patient Declined (04/23/2024)   Received from [facility]  Tobacco Use: High Risk (05/21/2024)    Readmission Risk Interventions    05/03/2024    4:53 PM  Readmission Risk Prevention Plan  Post Dischage Appt Complete  Medication Screening Complete  Transportation Screening Complete

## 2024-05-24 NOTE — Plan of Care (Signed)
  Problem: Pain Managment: Goal: General experience of comfort will improve and/or be controlled Outcome: Progressing   Problem: Safety: Goal: Ability to remain free from injury will improve Outcome: Progressing   Problem: Skin Integrity: Goal: Risk for impaired skin integrity will decrease Outcome: Progressing

## 2024-05-24 NOTE — Progress Notes (Signed)
 Physical Therapy Treatment Patient Details Name: Carl Hill MRN: [id number] DOB: 04/13/1971 Today's Date: 05/24/2024   History of Present Illness Pt is a 53 y.o. M who presents 05/21/2024 with concern for recurrent cellulitis. Significant PMH: hypertension, hyperlipidemia, diabetes, venous insufficiency, atrial fibrillation, hypothyroidism, adrenal insufficiency, obesity, neuropathy, anemia, asthma, OSA, chronic pain.    PT Comments  Pt making steady progress towards his physical therapy goals and is agreeable to participate. Pt ambulating 120 ft with a RW at a supervision level. DOE 2/4. He continues to demonstrate decreased cardiopulmonary endurance and impaired standing balance, but this does appear to be improving since admission.   Pt became emotionally labile describing home social dynamic with PT; PT provided emotional support. Pt reports that his father was changing his BLE dressings, but since his mother returned home, his family will not enter the home to provide assist due to strained relationship with mother. Due to BMI, pt needs assist for dressing changes and intermittently with posterior peri care. Notified CSW.    If plan is discharge home, recommend the following: A lot of help with bathing/dressing/bathroom;Assistance with cooking/housework;Assist for transportation;Help with stairs or ramp for entrance   Can travel by private vehicle     No (due to BMI)  Equipment Recommendations  None recommended by PT    Recommendations for Other Services       Precautions / Restrictions Precautions Precautions: Fall Precaution/Restrictions Comments: Watch HR Restrictions Weight Bearing Restrictions Per Provider Order: No     Mobility  Bed Mobility Overal bed mobility: Needs Assistance Bed Mobility: Supine to Sit, Sit to Supine     Supine to sit: Supervision Sit to supine: Supervision        Transfers Overall transfer level: Needs assistance Equipment used:  Rolling walker (2 wheels) Transfers: Sit to/from Stand Sit to Stand: Supervision                Ambulation/Gait Ambulation/Gait assistance: Supervision Gait Distance (Feet): 120 Feet Assistive device: Rolling walker (2 wheels) Gait Pattern/deviations: Step-through pattern, Decreased stride length, Trunk flexed Gait velocity: decreased     General Gait Details: Slow and effortful pace   Stairs             Wheelchair Mobility     Tilt Bed    Modified Rankin (Stroke Patients Only)       Balance Overall balance assessment: Needs assistance Sitting-balance support: Feet supported Sitting balance-Leahy Scale: Good     Standing balance support: Bilateral upper extremity supported, During functional activity, Reliant on assistive device for balance Standing balance-Leahy Scale: Poor                              Communication Communication Communication: No apparent difficulties  Cognition Arousal: Alert Behavior During Therapy: WFL for tasks assessed/performed   PT - Cognitive impairments: No apparent impairments                       PT - Cognition Comments: Can be verbose Following commands: Intact      Cueing Cueing Techniques: Verbal cues  Exercises      General Comments        Pertinent Vitals/Pain Pain Assessment Pain Assessment: Faces Faces Pain Scale: Hurts even more Pain Location: neuropathic pain forearms, hands Pain Descriptors / Indicators: Constant, Grimacing, Guarding Pain Intervention(s): Monitored during session    Home Living  Prior Function            PT Goals (current goals can now be found in the care plan section) Acute Rehab PT Goals Patient Stated Goal: For wounds to heal PT Goal Formulation: With patient Time For Goal Achievement: 06/05/24 Potential to Achieve Goals: Good Progress towards PT goals: Progressing toward goals    Frequency    Min  2X/week      PT Plan      Co-evaluation              AM-PAC PT 6 Clicks Mobility   Outcome Measure  Help needed turning from your back to your side while in a flat bed without using bedrails?: None Help needed moving from lying on your back to sitting on the side of a flat bed without using bedrails?: A Little Help needed moving to and from a bed to a chair (including a wheelchair)?: A Little Help needed standing up from a chair using your arms (e.g., wheelchair or bedside chair)?: A Little Help needed to walk in hospital room?: A Little Help needed climbing 3-5 steps with a railing? : A Lot 6 Click Score: 18    End of Session Equipment Utilized During Treatment: Gait belt Activity Tolerance: Patient tolerated treatment well Patient left: in bed;with call bell/phone within reach Nurse Communication: Mobility status PT Visit Diagnosis: Difficulty in walking, not elsewhere classified (R26.2)     Time: 9055-8979 PT Time Calculation (min) (ACUTE ONLY): 36 min  Charges:    $Therapeutic Activity: 23-37 mins PT General Charges $$ ACUTE PT VISIT: 1 Visit                     Carl Hill, PT, DPT Acute Rehabilitation Services Office [phone]    Carl Hill 05/24/2024, 10:30 AM

## 2024-05-24 NOTE — [place] FL2 (Signed)
 " [place]  MEDICAID FL2 LEVEL OF CARE FORM     IDENTIFICATION  Patient Name: Carl Hill Birthdate: 1970-12-29 Sex: male Admission Date (Current Location): 05/21/2024  [facility] and [place] Number:  Producer, Television/film/video and Address:  [facility] [place], [address] [place], [place] [address]      Provider Number: [id number]  Attending Physician Name and Address:  Christobal Guadalajara, MD  Relative Name and Phone Number:  [place] (Mother)  [phone]    Current Level of Care: Hospital Recommended Level of Care: Skilled Nursing Facility Prior Approval Number:    Date Approved/Denied:   PASRR Number: [id number]  Discharge Plan: SNF    Current Diagnoses: Patient Active Problem List   Diagnosis Date Noted   Cellulitis 05/21/2024   Anemia 10/28/2022   Cellulitis of right leg 08/05/2022   Closed fracture of distal fibula - right 08/05/2022   Adrenal insufficiency (HCC) - secondary 05/14/2021   DM2 (diabetes mellitus, type 2) (HCC) 12/10/2020   H/O small bowel obstruction    Muscle spasms of both lower extremities 08/15/2020   Recurrent knee instability, 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   Super-super obese (HCC)    Urinary retention    Diabetic peripheral neuropathy (HCC)    Sacral pain    Debility 08/06/2020   Diabetic ulcer of heel (HCC) 06/10/2020   Atrial fibrillation, chronic (HCC) 06/10/2020   Essential hypertension 06/10/2020   Dyslipidemia 06/10/2020   Acquired hypothyroidism 06/10/2020   Chronic pain disorder 06/10/2020   Morbid obesity with BMI of 60.0-69.9, adult (HCC) 06/10/2020   Chronic venous insufficiency 04/09/2020   Lymphedema 09/14/2019   Mild intermittent asthma without complication 07/15/2015   Bariatric surgery status 05/21/2015   Obstructive sleep apnea (adult) (pediatric) 05/16/2014    Orientation RESPIRATION BLADDER Height & Weight     Self, Time, Place, Situation  Normal Continent Weight: (!) 549 lb (249 kg) Height:  6' 6 (198.1  cm)  BEHAVIORAL SYMPTOMS/MOOD NEUROLOGICAL BOWEL NUTRITION STATUS      Continent Diet (See DC Summary)  AMBULATORY STATUS COMMUNICATION OF NEEDS Skin   Limited Assist Verbally Other (Comment) (wound on right & left pretibial)                       Personal Care Assistance Level of Assistance  Bathing, Feeding, Dressing Bathing Assistance: Maximum assistance Feeding assistance: Independent Dressing Assistance: Maximum assistance     Functional Limitations Info  Sight, Speech, Hearing Sight Info: Impaired Hearing Info: Adequate Speech Info: Adequate    SPECIAL CARE FACTORS FREQUENCY  PT (By licensed PT), OT (By licensed OT)     PT Frequency: 5x/week OT Frequency: 5x/week            Contractures Contractures Info: Not present    Additional Factors Info  Code Status, Allergies Code Status Info: Full Allergies Info: Bee venom; Sglt2 Inhibitors           Current Medications (05/24/2024):  This is the current hospital active medication list Current Facility-Administered Medications  Medication Dose Route Frequency Provider Last Rate Last Admin   acetaminophen  (TYLENOL ) tablet 650 mg  650 mg Oral Q6H PRN Seena Marsa NOVAK, MD       Or   acetaminophen  (TYLENOL ) suppository 650 mg  650 mg Rectal Q6H PRN Seena Marsa NOVAK, MD       albuterol  (PROVENTIL ) (2.5 MG/3ML) 0.083% nebulizer solution 3 mL  3 mL Inhalation Q6H PRN Seena Marsa NOVAK, MD  calcium -vitamin D  (OSCAL WITH D) 500-5 MG-MCG per tablet 1 tablet  1 tablet Oral Q breakfast Caleen Colander, MD   1 tablet at 05/24/24 0820   cefadroxil  (DURICEF) capsule 500 mg  500 mg Oral BID Amin, Aqsa, MD   500 mg at 05/24/24 9177   diltiazem  (CARDIZEM  CD) 24 hr capsule 120 mg  120 mg Oral Daily Melvin, Alexander B, MD   120 mg at 05/24/24 9178   DULoxetine  (CYMBALTA ) DR capsule 20 mg  20 mg Oral BID Melvin, Alexander B, MD   20 mg at 05/24/24 9177   Gerhardt's butt cream 1 Application  1 Application Topical TID PRN  Melvin, Alexander B, MD       HYDROcodone -acetaminophen  (NORCO/VICODIN) 5-325 MG per tablet 1 tablet  1 tablet Oral Q6H PRN Seena Marsa NOVAK, MD   1 tablet at 05/24/24 0820   insulin  aspart (novoLOG ) injection 0-5 Units  0-5 Units Subcutaneous QHS Caleen Colander, MD   2 Units at 05/23/24 2211   insulin  aspart (novoLOG ) injection 0-9 Units  0-9 Units Subcutaneous TID WC Caleen Colander, MD   5 Units at 05/24/24 1221   insulin  aspart (novoLOG ) injection 6 Units  6 Units Subcutaneous TID WC Caleen Colander, MD   6 Units at 05/24/24 1221   insulin  glargine (LANTUS ) injection 20 Units  20 Units Subcutaneous QHS Caleen Colander, MD   20 Units at 05/23/24 2211   levothyroxine  (SYNTHROID ) tablet 100 mcg  100 mcg Oral Q0600 Melvin, Alexander B, MD   100 mcg at 05/24/24 9380   multivitamin with minerals tablet 1 tablet  1 tablet Oral Daily Amin, Aqsa, MD   1 tablet at 05/24/24 9178   nortriptyline  (PAMELOR ) capsule 75 mg  75 mg Oral BID Melvin, Alexander B, MD   75 mg at 05/24/24 9177   pantoprazole  (PROTONIX ) EC tablet 40 mg  40 mg Oral Daily Melvin, Alexander B, MD   40 mg at 05/24/24 0821   polyethylene glycol (MIRALAX  / GLYCOLAX ) packet 17 g  17 g Oral Daily PRN Melvin, Alexander B, MD       predniSONE  (DELTASONE ) tablet 2.5 mg  2.5 mg Oral Q breakfast Melvin, Alexander B, MD   2.5 mg at 05/24/24 9178   predniSONE  (DELTASONE ) tablet 5 mg  5 mg Oral QHS Melvin, Alexander B, MD   5 mg at 05/23/24 2209   pregabalin  (LYRICA ) capsule 200 mg  200 mg Oral TID Melvin, Alexander B, MD   200 mg at 05/24/24 9167   rivaroxaban  (XARELTO ) tablet 20 mg  20 mg Oral q AM Melvin, Alexander B, MD   20 mg at 05/24/24 9379   rosuvastatin  (CRESTOR ) tablet 20 mg  20 mg Oral QHS Melvin, Alexander B, MD   20 mg at 05/23/24 2209   sodium chloride  flush (NS) 0.9 % injection 3 mL  3 mL Intravenous Q12H Seena Marsa NOVAK, MD   3 mL at 05/24/24 9176     Discharge Medications: Please see discharge summary for a list of discharge  medications.  Relevant Imaging Results:  Relevant Lab Results:   Additional Information SSN: [id number]  Jeoffrey LITTIE Moose, LCSWA     "

## 2024-05-25 DIAGNOSIS — E1142 Type 2 diabetes mellitus with diabetic polyneuropathy: Secondary | ICD-10-CM | POA: Diagnosis not present

## 2024-05-25 LAB — GLUCOSE, CAPILLARY
Glucose-Capillary: 225 mg/dL — ABNORMAL HIGH (ref 70–99)
Glucose-Capillary: 213 mg/dL — ABNORMAL HIGH (ref 70–99)
Glucose-Capillary: 229 mg/dL — ABNORMAL HIGH (ref 70–99)
Glucose-Capillary: 175 mg/dL — ABNORMAL HIGH (ref 70–99)

## 2024-05-25 MED ORDER — INSULIN GLARGINE 100 UNIT/ML ~~LOC~~ SOLN
25.0000 [IU] | Freq: Every day | SUBCUTANEOUS | Status: DC
  Administered 2024-05-25 – 2024-05-26 (×2): 25 [IU] via SUBCUTANEOUS
  Filled 2024-05-25 (×3): qty 0.25

## 2024-05-25 MED ORDER — VITAMIN D 25 MCG (1000 UNIT) PO TABS
2000.0000 [IU] | ORAL_TABLET | Freq: Every day | ORAL | Status: DC
  Administered 2024-05-25 – 2024-05-29 (×5): 2000 [IU] via ORAL
  Filled 2024-05-25 (×5): qty 2

## 2024-05-25 NOTE — Discharge Instructions (Signed)

## 2024-05-25 NOTE — Progress Notes (Signed)
 " PROGRESS NOTE Carl Hill  FMW:[id number] DOB: December 13, 1970 DOA: 05/21/2024 PCP: Emilio Joesph DEL, PA-C  Brief Narrative/Hospital Course: Carl Hill is a 53 y.o. male with PMH of  of hypertension, hyperlipidemia, diabetes, venous insufficiency, atrial fibrillation, hypothyroidism, adrenal insufficiency, obesity, neuropathy, anemia, asthma, OSA, chronic pain presenting with concern for recurrent cellulitis.Patient has chronic issues with recurrent cellulitis and was recently admitted 12/1-12/7 with right lower extremity cellulitis treated initially with IV antibiotics and then transition to p.o. at discharge to complete a 10-day course. His HH nurse was concerned about cellulitis and advised he come to the ED to be evaluated  AND patient was admitted for further management of cellulitis Overall no leukocytosis, suspect in the setting of immunosuppression and chronic prednisone  with adrenal insufficiency and her worsening edema leading to recurrent cellulitis, at this time patient medically stable will plan for discharge on oral antibiotics x 7 days Blood culture 12/21 NGTD  Subjective: Seen and examined  Leg pain better no new complaints On [place] during night  He just woke up Overnight remains afebrile, blood sugar 219  Assessment and plan:  Cellulitis, recurrent: Patient reports recurrent right lower extremity cellulitis.  Possible changes on the left as well.  Previously treated for this in the hospital 12/1-12/7.Has had issues with worsening edema leading to blistering in the setting of venous insufficiency and his morbid obesity due to being unable to get a hospital bed at home so far that will fit him (leading to him spending a lot of time upright/sleeping up right). Overall no leukocytosis no fever.patient is immunosuppressed with chronic prednisone  in setting of adrenal insufficiency. Continue with wound care as instructed along with oral antibiotics. It appears patient does not have  much help at home since his mother moved to house his father will not be able to come to the house to assist with wound care given difficult social situation at home  Patient wondering about SNF placement TOC notified-looking into SNF for wound care   Hypertension Stable, continue home diltiazem    Atrial fibrillation Rate controlled, continue home diltiazem , Xarelto    Hyperlipidemia Continue home rosuvastatin    Diabetes melitis on long-term insulin  with uncontrolled hyperglycemia: PTA on  Humulin 90-100 u tid w/ meals.Adjust insulin  Lantus  to 25 units and, on 6 units Premeal and 0 to 9 units SSI  Recent Labs  Lab 05/23/24 0502 05/23/24 0734 05/24/24 0802 05/24/24 1210 05/24/24 1706 05/24/24 2105 05/25/24 0740  GLUCAP  --    < > 226* 252* 187* 219* 225*  HGBA1C 8.7*  --   --   --   --   --   --    < > = values in this interval not displayed.    Venous insufficiency Continue to elevate the leg, counseled, continue dressing changes   Hypothyroidism Continue home Synthroid    Adrenal insufficiency Continue home prednisone  for now, currently normotensive.  Neuropathy Continue home nortriptyline  and Lyrica   Chronic anemia Hemoglobin currently stable, will trend   Asthma Stable,Continue as needed albuterol    Morbid obesity with Body mass index is 63.44 kg/m.: Will benefit with PCP follow-up, weight loss,.  Uses oxygen nocturnally not using CPAP  Mobility: PT Orders: Active PT Follow up Rec: Skilled Nursing-Short Term Rehab (<3 Hours/Day) (For Wound Care)05/24/2024 1028   DVT prophylaxis: On Xarelto  Code Status:   Code Status: Full Code Family Communication: plan of care discussed with patient at bedside. Patient status is: Remains hospitalized because of severity of illness Level of care: Telemetry  Dispo: The patient is from: home            Anticipated disposition: Awaiting an SNF  Objective: Vitals last 24 hrs: Vitals:   05/24/24 0913 05/24/24 1709 05/24/24  2110 05/25/24 0534  BP: (!) 160/85 124/68 131/66 (!) 156/87  Pulse: (!) 103 (!) 101 (!) 101 98  Resp: 18 18 17 16   Temp: 98 F (36.7 C) 98 F (36.7 C) 97.8 F (36.6 C) 97.7 F (36.5 C)  TempSrc: Oral Oral Oral Oral  SpO2: 92% 95% 96% 95%  Weight:      Height:        Physical Examination: General exam: AOX3, morbidly obese, pleasant  HEENT:Oral mucosa moist, Ear/Nose WNL grossly Respiratory system: Bilaterally clear BS,no use of accessory muscle Cardiovascular system: S1 & S2 +, No JVD. Gastrointestinal system: Abdomen soft,NT,ND, BS+ Nervous System: Alert, awake, moving all extremities,and following commands. Extremities: extremities warm, leg edema + chronic appearing with improving wound see picture Skin: Warm, no rashes MSK: Normal muscle bulk,tone, power   Medications reviewed:  Scheduled Meds:  calcium -vitamin D   1 tablet Oral Q breakfast   cefadroxil   500 mg Oral BID   cholecalciferol   2,000 Units Oral QHS   diltiazem   120 mg Oral Daily   DULoxetine   20 mg Oral BID   insulin  aspart  0-5 Units Subcutaneous QHS   insulin  aspart  0-9 Units Subcutaneous TID WC   insulin  aspart  6 Units Subcutaneous TID WC   insulin  glargine  25 Units Subcutaneous QHS   levothyroxine   100 mcg Oral Q0600   multivitamin with minerals  1 tablet Oral Daily   nortriptyline   75 mg Oral BID   pantoprazole   40 mg Oral Daily   predniSONE   2.5 mg Oral Q breakfast   predniSONE   5 mg Oral QHS   pregabalin   200 mg Oral TID   rivaroxaban   20 mg Oral q AM   rosuvastatin   20 mg Oral QHS   sodium chloride  flush  3 mL Intravenous Q12H   Continuous Infusions: Diet: Diet Order             Diet regular Room service appropriate? Yes; Fluid consistency: Thin; Fluid restriction: 2000 mL Fluid  Diet effective now                    Data Reviewed: I have personally reviewed following labs and imaging studies ( see epic result tab) CBC: Recent Labs  Lab 05/21/24 0806 05/22/24 0502  WBC 6.2  7.7  NEUTROABS 3.2  --   HGB 13.5 12.6*  HCT 44.8 41.7  MCV 84.8 85.1  PLT 257 227   CMP: Recent Labs  Lab 05/21/24 0806 05/22/24 0502  NA 138 141  K 4.0 4.6  CL 100 102  CO2 26 27  GLUCOSE 262* 208*  BUN 19 12  CREATININE 1.09 0.96  CALCIUM  9.2 8.6*   GFR: Estimated Creatinine Clearance: 196.6 mL/min (by C-G formula based on SCr of 0.96 mg/dL). Recent Labs  Lab 05/21/24 0806 05/22/24 0502  AST 29 25  ALT 20 18  ALKPHOS 90 81  BILITOT 0.4 0.4  PROT 7.4 6.6  ALBUMIN  3.6 3.3*   No results for input(s): LIPASE, AMYLASE in the last 168 hours. No results for input(s): AMMONIA in the last 168 hours. Coagulation Profile:  Recent Labs  Lab 05/21/24 1043  INR 1.1   Unresulted Labs (From admission, onward)    None      Antimicrobials/Microbiology:  Anti-infectives (From admission, onward)    Start     Dose/Rate Route Frequency Ordered Stop   05/24/24 1000  cefpodoxime  (VANTIN ) 100 MG/5ML suspension 200 mg  Status:  Discontinued        200 mg Oral Every 12 hours 05/23/24 1551 05/23/24 1610   05/24/24 1000  cefadroxil  (DURICEF) capsule 500 mg        500 mg Oral 2 times daily 05/23/24 1610 05/28/24 0959   05/22/24 1000  cefTRIAXone  (ROCEPHIN ) 2 g in sodium chloride  0.9 % 100 mL IVPB  Status:  Discontinued        2 g 200 mL/hr over 30 Minutes Intravenous Every 24 hours 05/21/24 1348 05/23/24 1551   05/21/24 0945  cefTRIAXone  (ROCEPHIN ) 2 g in sodium chloride  0.9 % 100 mL IVPB        2 g 200 mL/hr over 30 Minutes Intravenous  Once 05/21/24 0930 05/21/24 1112         Component Value Date/Time   SDES BLOOD SITE NOT SPECIFIED 05/21/2024 2204   SPECREQUEST  05/21/2024 2204    BOTTLES DRAWN AEROBIC ONLY Blood Culture results may not be optimal due to an inadequate volume of blood received in culture bottles   CULT  05/21/2024 2204    NO GROWTH 3 DAYS Performed at [facility], [address], [place], [place] [address]    REPTSTATUS PENDING 05/21/2024 2204     Procedures:       Mennie LAMY, MD [facility] 05/25/2024, 11:17 AM   "

## 2024-05-25 NOTE — Plan of Care (Signed)
  Problem: Clinical Measurements: Goal: Ability to avoid or minimize complications of infection will improve Outcome: Progressing   Problem: Skin Integrity: Goal: Skin integrity will improve Outcome: Progressing   Problem: Activity: Goal: Risk for activity intolerance will decrease Outcome: Progressing   Problem: Nutrition: Goal: Adequate nutrition will be maintained Outcome: Progressing   

## 2024-05-25 NOTE — Progress Notes (Signed)
 Mobility Specialist Progress Note:    05/25/24 1017  Mobility  Activity Refused and notified nurse if applicable   Pt refused mobility d/t fatigue. Despite education pt refused. Will f/u as able.    Lavanda Pollack Mobility Specialist  Please contact via [employer] or  Rehab Office [phone]

## 2024-05-26 DIAGNOSIS — L03115 Cellulitis of right lower limb: Secondary | ICD-10-CM | POA: Diagnosis not present

## 2024-05-26 LAB — CULTURE, BLOOD (ROUTINE X 2)
Culture: NO GROWTH
Culture: NO GROWTH
Culture: NO GROWTH
Culture: NO GROWTH
Special Requests: ADEQUATE
Special Requests: ADEQUATE

## 2024-05-26 LAB — GLUCOSE, CAPILLARY
Glucose-Capillary: 255 mg/dL — ABNORMAL HIGH (ref 70–99)
Glucose-Capillary: 239 mg/dL — ABNORMAL HIGH (ref 70–99)
Glucose-Capillary: 237 mg/dL — ABNORMAL HIGH (ref 70–99)
Glucose-Capillary: 235 mg/dL — ABNORMAL HIGH (ref 70–99)

## 2024-05-26 LAB — MRSA NEXT GEN BY PCR, NASAL: MRSA by PCR Next Gen: NOT DETECTED

## 2024-05-26 MED ORDER — FUROSEMIDE 40 MG PO TABS
40.0000 mg | ORAL_TABLET | Freq: Every day | ORAL | Status: DC
  Administered 2024-05-26 – 2024-05-30 (×5): 40 mg via ORAL
  Filled 2024-05-26 (×4): qty 1

## 2024-05-26 MED ORDER — DOXYCYCLINE HYCLATE 100 MG PO TABS
100.0000 mg | ORAL_TABLET | Freq: Two times a day (BID) | ORAL | Status: DC
  Administered 2024-05-26 – 2024-05-30 (×9): 100 mg via ORAL
  Filled 2024-05-26 (×8): qty 1

## 2024-05-26 NOTE — Progress Notes (Signed)
 " PROGRESS NOTE Carl Hill  FMW:[id number] DOB: 05/28/1971 DOA: 05/21/2024 PCP: Emilio Joesph DEL, PA-C  Brief Narrative/Hospital Course: Carl Hill is a 53 y.o. male with PMH of  of hypertension, hyperlipidemia, diabetes, venous insufficiency, atrial fibrillation, hypothyroidism, adrenal insufficiency, obesity, neuropathy, anemia, asthma, OSA, chronic pain presenting with concern for recurrent cellulitis.Patient has chronic issues with recurrent cellulitis and was recently admitted 12/1-12/7 with right lower extremity cellulitis treated initially with IV antibiotics and then transition to p.o. at discharge to complete a 10-day course. His HH nurse was concerned about cellulitis and advised he come to the ED to be evaluated  AND patient was admitted for further management of cellulitis Overall no leukocytosis, suspect in the setting of immunosuppression and chronic prednisone  with adrenal insufficiency and her worsening edema leading to recurrent cellulitis, at this time patient medically stable will plan for discharge on oral antibiotics x 7 days Blood culture 12/21 NGTD  Subjective: Seen and examined  Overall feeling better, discontinued this morning still had some on open blisters On [place] during night no new complaints Overnight remains afebrile, VSS  Assessment and plan:  Cellulitis, recurrent Chronic venous insufficiency: Patient reports recurrent right lower extremity cellulitis.  Possible changes on the left as well.  Previously treated for this in the hospital 12/1-12/7.Has had issues with worsening edema leading to blistering in the setting of venous insufficiency and his morbid obesity due to being unable to get a hospital bed at home so far that will fit him (leading to him spending a lot of time upright/sleeping up right). Overall no leukocytosis no fever.patient is immunosuppressed with chronic prednisone  in setting of adrenal insufficiency. Continue with wound care as  instructed along with oral antibiotics-cefadroxil , add doxycycline  pending MRSA SCREEN due to ongoing cellulitis It appears patient does not have much help at home since his mother moved to house his father will not be able to come to the house to assist with wound care given difficult social situation at home  [facility] working on SNF placement for wound care    Hypertension hld Stable, continue home diltiazem .  Continue Crestor    Atrial fibrillation Rate controlled, continue home diltiazem , Xarelto     Diabetes melitis on long-term insulin  with uncontrolled hyperglycemia: PTA on  Humulin 90-100 u tid w/ meals.Adjusted insulin , Cont on Lantus  to 25 units and, on 6 units Premeal and 0 to 9 units SSI  Recent Labs  Lab 05/23/24 0502 05/23/24 0734 05/25/24 0740 05/25/24 1202 05/25/24 1645 05/25/24 2045 05/26/24 0736  GLUCAP  --    < > 225* 213* 229* 175* 255*  HGBA1C 8.7*  --   --   --   --   --   --    < > = values in this interval not displayed.    Hypothyroidism Continue home Synthroid    Adrenal insufficiency Continue home prednisone  for now, currently normotensive.  Neuropathy Continue home nortriptyline  and Lyrica   Chronic anemia Hemoglobin currently stable, will trend   Asthma Stable,Continue as needed albuterol    Morbid obesity with Body mass index is 63.44 kg/m.: Will benefit with PCP follow-up, weight loss,.  Uses oxygen nocturnally not using CPAP  Mobility: PT Orders: Active PT Follow up Rec: Skilled Nursing-Short Term Rehab (<3 Hours/Day) (For Wound Care)05/24/2024 1028   DVT prophylaxis: On Xarelto  Code Status:   Code Status: Full Code Family Communication: plan of care discussed with patient at bedside. Patient status is: Remains hospitalized because of severity of illness Level of care: Telemetry  Dispo: The patient is from: home            Anticipated disposition: Awaiting an SNF  Objective: Vitals last 24 hrs: Vitals:   05/25/24 2046 05/26/24 0524  05/26/24 0947 05/26/24 0950  BP: 117/79 (!) 151/89 (!) 156/83 (!) 156/83  Pulse: 98 94 92   Resp: 19 18 17    Temp: 98.4 F (36.9 C) (!) 97.5 F (36.4 C) (!) 97.5 F (36.4 C)   TempSrc:  Oral Oral   SpO2: 95% 97% 91%   Weight:      Height:        Physical Examination: General exam: AOX3, morbidly obese but pleasant , NAD HEENT:Oral mucosa moist, Ear/Nose WNL grossly Respiratory system: Bilaterally diminished due to body habitus mostly clear upper airways  Cardiovascular system: S1 & S2 +, No JVD. Gastrointestinal system: Abdomen soft,NT,ND, BS+ Nervous System: Alert, awake, moving all extremities,and following commands. Extremities: extremities warm, leg edema W/ some blister, erythema see picture Skin: Warm, no rashes MSK: Normal muscle bulk,tone, power   Medications reviewed:  Scheduled Meds:  calcium -vitamin D   1 tablet Oral Q breakfast   cefadroxil   500 mg Oral BID   cholecalciferol   2,000 Units Oral QHS   diltiazem   120 mg Oral Daily   DULoxetine   20 mg Oral BID   furosemide   40 mg Oral Daily   insulin  aspart  0-5 Units Subcutaneous QHS   insulin  aspart  0-9 Units Subcutaneous TID WC   insulin  aspart  6 Units Subcutaneous TID WC   insulin  glargine  25 Units Subcutaneous QHS   levothyroxine   100 mcg Oral Q0600   multivitamin with minerals  1 tablet Oral Daily   nortriptyline   75 mg Oral BID   pantoprazole   40 mg Oral Daily   predniSONE   2.5 mg Oral Q breakfast   predniSONE   5 mg Oral QHS   pregabalin   200 mg Oral TID   rivaroxaban   20 mg Oral q AM   rosuvastatin   20 mg Oral QHS   sodium chloride  flush  3 mL Intravenous Q12H   Continuous Infusions: Diet: Diet Order             Diet regular Room service appropriate? Yes; Fluid consistency: Thin; Fluid restriction: 2000 mL Fluid  Diet effective now                    Data Reviewed: I have personally reviewed following labs and imaging studies ( see epic result tab) CBC: Recent Labs  Lab 05/21/24 0806  05/22/24 0502  WBC 6.2 7.7  NEUTROABS 3.2  --   HGB 13.5 12.6*  HCT 44.8 41.7  MCV 84.8 85.1  PLT 257 227   CMP: Recent Labs  Lab 05/21/24 0806 05/22/24 0502  NA 138 141  K 4.0 4.6  CL 100 102  CO2 26 27  GLUCOSE 262* 208*  BUN 19 12  CREATININE 1.09 0.96  CALCIUM  9.2 8.6*   GFR: Estimated Creatinine Clearance: 196.6 mL/min (by C-G formula based on SCr of 0.96 mg/dL). Recent Labs  Lab 05/21/24 0806 05/22/24 0502  AST 29 25  ALT 20 18  ALKPHOS 90 81  BILITOT 0.4 0.4  PROT 7.4 6.6  ALBUMIN  3.6 3.3*   No results for input(s): LIPASE, AMYLASE in the last 168 hours. No results for input(s): AMMONIA in the last 168 hours. Coagulation Profile:  Recent Labs  Lab 05/21/24 1043  INR 1.1   Unresulted Labs (From admission, onward)  Start     Ordered   05/27/24 0500  Basic metabolic panel with GFR  Tomorrow morning,   R        05/26/24 0851   05/27/24 0500  CBC  Tomorrow morning,   R        05/26/24 0851   05/26/24 1114  MRSA Next Gen by PCR, Nasal  Once,   R        05/26/24 1113           Antimicrobials/Microbiology: Anti-infectives (From admission, onward)    Start     Dose/Rate Route Frequency Ordered Stop   05/26/24 1200  doxycycline  (VIBRA -TABS) tablet 100 mg        100 mg Oral Every 12 hours 05/26/24 1113 05/31/24 0959   05/24/24 1000  cefpodoxime  (VANTIN ) 100 MG/5ML suspension 200 mg  Status:  Discontinued        200 mg Oral Every 12 hours 05/23/24 1551 05/23/24 1610   05/24/24 1000  cefadroxil  (DURICEF) capsule 500 mg        500 mg Oral 2 times daily 05/23/24 1610 05/28/24 0959   05/22/24 1000  cefTRIAXone  (ROCEPHIN ) 2 g in sodium chloride  0.9 % 100 mL IVPB  Status:  Discontinued        2 g 200 mL/hr over 30 Minutes Intravenous Every 24 hours 05/21/24 1348 05/23/24 1551   05/21/24 0945  cefTRIAXone  (ROCEPHIN ) 2 g in sodium chloride  0.9 % 100 mL IVPB        2 g 200 mL/hr over 30 Minutes Intravenous  Once 05/21/24 0930 05/21/24 1112          Component Value Date/Time   SDES BLOOD SITE NOT SPECIFIED 05/21/2024 2204   SPECREQUEST  05/21/2024 2204    BOTTLES DRAWN AEROBIC ONLY Blood Culture results may not be optimal due to an inadequate volume of blood received in culture bottles   CULT  05/21/2024 2204    NO GROWTH 5 DAYS Performed at [facility], [address] [place], [place] [address]    REPTSTATUS 05/26/2024 FINAL 05/21/2024 2204    Procedures:    Mennie LAMY, MD [facility] 05/26/2024, 11:13 AM   "

## 2024-05-26 NOTE — Care Management Important Message (Signed)
 Important Message  Patient Details  Name: Carl Hill MRN: [id number] Date of Birth: [birth year]   Important Message Given:  Yes - Medicare IM     Jennie Laneta Dragon 05/26/2024, 3:33 PM

## 2024-05-26 NOTE — TOC Progression Note (Signed)
 Transition of Care [facility]) - Progression Note    Patient Details  Name: Carl Hill MRN: [id number] Date of Birth: 02-Mar-1971  [facility] of [facility]) CM/SW Contact  Messi Twedt LITTIE [place], [place] Phone Number: 05/26/2024, 1:47 PM  Clinical Narrative:    CSW met with pt at bedside and provided SNF bed offers. Pt currently only has one bed offer with  HC but stated he would like to think about it before making a decision. CSW will continue to follow.   Expected Discharge Plan: Skilled Nursing Facility Barriers to Discharge: Continued Medical Work up, English As A Second Language Teacher, SNF Pending bed offer               Expected Discharge Plan and Services In-house Referral: NA Discharge Planning Services: CM Consult Post Acute Care Choice: Home Health, Resumption of Svcs/PTA Provider Living arrangements for the past 2 months: Single Family Home                   DME Agency: NA       HH Arranged: RN, Disease Management, PT, OT HH Agency: [facility] Date HH Agency Contacted: 05/22/24 Time HH Agency Contacted: 1545 Representative spoke with at [facility] Agency: [facility]   Social Drivers of Health (SDOH) Interventions SDOH Screenings   Food Insecurity: No Food Insecurity (05/21/2024)  Housing: Low Risk (05/21/2024)  Transportation Needs: No Transportation Needs (05/21/2024)  Utilities: Not At Risk (05/21/2024)  Financial Resource Strain: Patient Declined (04/23/2024)   Received from [facility]  Physical Activity: Unknown (04/23/2024)   Received from [facility]  Social Connections: Moderately Integrated (04/23/2024)   Received from [facility]  Stress: Patient Declined (04/23/2024)   Received from [facility]  Tobacco Use: High Risk (05/21/2024)    Readmission Risk Interventions    05/03/2024    4:53 PM  Readmission Risk Prevention Plan  Post Dischage Appt Complete  Medication Screening Complete  Transportation Screening Complete

## 2024-05-26 NOTE — Social Work (Signed)
 Patient asking for visit from Child Psychotherapist.

## 2024-05-26 NOTE — Plan of Care (Signed)
  Problem: Clinical Measurements: Goal: Ability to avoid or minimize complications of infection will improve Outcome: Progressing   Problem: Skin Integrity: Goal: Skin integrity will improve Outcome: Progressing   Problem: Education: Goal: Knowledge of General Education information will improve Description: Including pain rating scale, medication(s)/side effects and non-pharmacologic comfort measures Outcome: Progressing   Problem: Health Behavior/Discharge Planning: Goal: Ability to manage health-related needs will improve Outcome: Progressing   Problem: Clinical Measurements: Goal: Ability to maintain clinical measurements within normal limits will improve Outcome: Progressing Goal: Will remain free from infection Outcome: Progressing Goal: Diagnostic test results will improve Outcome: Progressing Goal: Respiratory complications will improve Outcome: Progressing Goal: Cardiovascular complication will be avoided Outcome: Progressing   Problem: Activity: Goal: Risk for activity intolerance will decrease Outcome: Progressing   Problem: Nutrition: Goal: Adequate nutrition will be maintained Outcome: Progressing   Problem: Coping: Goal: Level of anxiety will decrease Outcome: Progressing   Problem: Elimination: Goal: Will not experience complications related to bowel motility Outcome: Progressing Goal: Will not experience complications related to urinary retention Outcome: Progressing   Problem: Pain Managment: Goal: General experience of comfort will improve and/or be controlled Outcome: Progressing   Problem: Safety: Goal: Ability to remain free from injury will improve Outcome: Progressing   Problem: Skin Integrity: Goal: Risk for impaired skin integrity will decrease Outcome: Progressing   Problem: Education: Goal: Ability to describe self-care measures that may prevent or decrease complications (Diabetes Survival Skills Education) will improve Outcome:  Progressing Goal: Individualized Educational Video(s) Outcome: Progressing   Problem: Coping: Goal: Ability to adjust to condition or change in health will improve Outcome: Progressing   Problem: Fluid Volume: Goal: Ability to maintain a balanced intake and output will improve Outcome: Progressing   Problem: Health Behavior/Discharge Planning: Goal: Ability to identify and utilize available resources and services will improve Outcome: Progressing Goal: Ability to manage health-related needs will improve Outcome: Progressing   Problem: Metabolic: Goal: Ability to maintain appropriate glucose levels will improve Outcome: Progressing   Problem: Nutritional: Goal: Maintenance of adequate nutrition will improve Outcome: Progressing Goal: Progress toward achieving an optimal weight will improve Outcome: Progressing   Problem: Skin Integrity: Goal: Risk for impaired skin integrity will decrease Outcome: Progressing   Problem: Tissue Perfusion: Goal: Adequacy of tissue perfusion will improve Outcome: Progressing

## 2024-05-26 NOTE — Progress Notes (Signed)
" °   05/26/24 0953  Spiritual Encounters  Type of Visit Follow up  Care provided to: Patient  Reason for visit Advance directives  OnCall Visit No   This chaplain followed up with the patient about an Advance Directive. He is ready to get the document notarized. He is also asking for a child psychotherapist to converse with him about his post-hospital needs. "

## 2024-05-26 NOTE — Plan of Care (Signed)
   Problem: Education: Goal: Knowledge of General Education information will improve Description: Including pain rating scale, medication(s)/side effects and non-pharmacologic comfort measures Outcome: Progressing   Problem: Activity: Goal: Risk for activity intolerance will decrease Outcome: Progressing   Problem: Nutrition: Goal: Adequate nutrition will be maintained Outcome: Progressing

## 2024-05-27 DIAGNOSIS — L03115 Cellulitis of right lower limb: Secondary | ICD-10-CM | POA: Diagnosis not present

## 2024-05-27 LAB — CBC
HCT: 42.8 % (ref 39.0–52.0)
Hemoglobin: 13.2 g/dL (ref 13.0–17.0)
MCH: 25.4 pg — ABNORMAL LOW (ref 26.0–34.0)
MCHC: 30.8 g/dL (ref 30.0–36.0)
MCV: 82.5 fL (ref 80.0–100.0)
Platelets: 241 K/uL (ref 150–400)
RBC: 5.19 MIL/uL (ref 4.22–5.81)
RDW: 17.4 % — ABNORMAL HIGH (ref 11.5–15.5)
WBC: 7.5 K/uL (ref 4.0–10.5)
nRBC: 0.5 % — ABNORMAL HIGH (ref 0.0–0.2)

## 2024-05-27 LAB — BASIC METABOLIC PANEL WITH GFR
Anion gap: 11 (ref 5–15)
BUN: 12 mg/dL (ref 6–20)
CO2: 27 mmol/L (ref 22–32)
Calcium: 9.1 mg/dL (ref 8.9–10.3)
Chloride: 100 mmol/L (ref 98–111)
Creatinine, Ser: 0.9 mg/dL (ref 0.61–1.24)
GFR, Estimated: >60 mL/min
Glucose, Bld: 199 mg/dL — ABNORMAL HIGH (ref 70–99)
Potassium: 4.3 mmol/L (ref 3.5–5.1)
Sodium: 138 mmol/L (ref 135–145)

## 2024-05-27 LAB — GLUCOSE, CAPILLARY
Glucose-Capillary: 218 mg/dL — ABNORMAL HIGH (ref 70–99)
Glucose-Capillary: 159 mg/dL — ABNORMAL HIGH (ref 70–99)
Glucose-Capillary: 164 mg/dL — ABNORMAL HIGH (ref 70–99)
Glucose-Capillary: 181 mg/dL — ABNORMAL HIGH (ref 70–99)

## 2024-05-27 MED ORDER — INSULIN GLARGINE 100 UNIT/ML ~~LOC~~ SOLN
30.0000 [IU] | Freq: Every day | SUBCUTANEOUS | Status: DC
  Administered 2024-05-27 – 2024-05-28 (×2): 30 [IU] via SUBCUTANEOUS
  Filled 2024-05-27 (×3): qty 0.3

## 2024-05-27 MED ORDER — INSULIN ASPART 100 UNIT/ML IJ SOLN
8.0000 [IU] | Freq: Three times a day (TID) | INTRAMUSCULAR | Status: DC
  Administered 2024-05-27 – 2024-05-30 (×10): 8 [IU] via SUBCUTANEOUS
  Filled 2024-05-27 (×6): qty 8

## 2024-05-27 NOTE — Progress Notes (Signed)
 Mobility Specialist Progress Note:   05/27/24 0931  Mobility  Activity Ambulated with assistance (In hallway)  Level of Assistance Standby assist, set-up cues, supervision of patient - no hands on  Assistive Device Front wheel walker  Distance Ambulated (ft) 140 ft  Activity Response Tolerated well  Mobility Referral Yes  Mobility visit 1 Mobility  Mobility Specialist Start Time (ACUTE ONLY) 0914  Mobility Specialist Stop Time (ACUTE ONLY) 0931  Mobility Specialist Time Calculation (min) (ACUTE ONLY) 17 min   Received pt in bed and agreeable to mobility. Pt required no physical assistance. C/o BLE discomfort, otherwise tolerated well. Returned to room without fault. Pt's max HR was 155 bpm after ambulation. Left pt in bed. Personal belongings and call light within reach. All needs met.    Lavanda Pollack Mobility Specialist  Please contact via [employer] or  Rehab Office [phone]

## 2024-05-27 NOTE — Progress Notes (Signed)
 " PROGRESS NOTE RUMI TARAS  FMW:[id number] DOB: Dec 25, 1970 DOA: 05/21/2024 PCP: Emilio Joesph DEL, PA-C  Brief Narrative/Hospital Course: Carl Hill is a 53 y.o. male with PMH of  of hypertension, hyperlipidemia, diabetes, venous insufficiency, atrial fibrillation, hypothyroidism, adrenal insufficiency, obesity, neuropathy, anemia, asthma, OSA, chronic pain presenting with concern for recurrent cellulitis.Patient has chronic issues with recurrent cellulitis and was recently admitted 12/1-12/7 with right lower extremity cellulitis treated initially with IV antibiotics and then transition to p.o. at discharge to complete a 10-day course. His HH nurse was concerned about cellulitis and advised he come to the ED to be evaluated  AND patient was admitted for further management of cellulitis Overall no leukocytosis, suspect in the setting of immunosuppression and chronic prednisone  with adrenal insufficiency and her worsening edema leading to recurrent cellulitis, at this time patient medically stable will plan for discharge on oral antibiotics x 7 days Blood culture 12/21 NGTD  Subjective: Seen and examined  Overall feeling better, discontinued this morning still had some on open blisters On [place] during night no new complaints Overnight remains afebrile, VSS  Assessment and plan:  Cellulitis, recurrent Chronic venous insufficiency: Patient reports recurrent right lower extremity cellulitis.  Possible changes on the left as well.  Previously treated for this in the hospital 12/1-12/7.Has had issues with worsening edema leading to blistering in the setting of venous insufficiency and his morbid obesity due to being unable to get a hospital bed at home so far that will fit him (leading to him spending a lot of time upright/sleeping up right). Overall no leukocytosis no fever.patient is immunosuppressed with chronic prednisone  in setting of adrenal insufficiency. Continue with wound care as  instructed along with oral antibiotics-cefadroxil , add doxycycline  pending MRSA SCREEN due to ongoing cellulitis It appears patient does not have much help at home since his mother moved to house his father will not be able to come to the house to assist with wound care given difficult social situation at home  [facility] working on SNF placement for wound care    Hypertension hld Stable, continue home diltiazem .  Continue Crestor    Atrial fibrillation Rate controlled, continue home diltiazem , Xarelto     Diabetes melitis on long-term insulin  with uncontrolled hyperglycemia: PTA on  Humulin 90-100 u tid w/ meals.Adjusted insulin , Cont on Lantus  to 25 units and, on 6 units Premeal and 0 to 9 units SSI  Recent Labs  Lab 05/23/24 0502 05/23/24 0734 05/25/24 0740 05/25/24 1202 05/25/24 1645 05/25/24 2045 05/26/24 0736  GLUCAP  --    < > 225* 213* 229* 175* 255*  HGBA1C 8.7*  --   --   --   --   --   --    < > = values in this interval not displayed.    Hypothyroidism Continue home Synthroid    Adrenal insufficiency Continue home prednisone  for now, currently normotensive.  Neuropathy Continue home nortriptyline  and Lyrica   Chronic anemia Hemoglobin currently stable, will trend   Asthma Stable,Continue as needed albuterol    Morbid obesity with Body mass index is 63.44 kg/m.: Will benefit with PCP follow-up, weight loss,.  Uses oxygen nocturnally not using CPAP  Mobility: PT Orders: Active PT Follow up Rec: Skilled Nursing-Short Term Rehab (<3 Hours/Day) (For Wound Care)05/24/2024 1028   DVT prophylaxis: On Xarelto  Code Status:   Code Status: Full Code Family Communication: plan of care discussed with patient at bedside. Patient status is: Remains hospitalized because of severity of illness Level of care: Telemetry  Dispo: The patient is from: home            Anticipated disposition: Awaiting an SNF  Objective: Vitals last 24 hrs: Vitals:   05/25/24 2046 05/26/24 0524  05/26/24 0947 05/26/24 0950  BP: 117/79 (!) 151/89 (!) 156/83 (!) 156/83  Pulse: 98 94 92   Resp: 19 18 17    Temp: 98.4 F (36.9 C) (!) 97.5 F (36.4 C) (!) 97.5 F (36.4 C)   TempSrc:  Oral Oral   SpO2: 95% 97% 91%   Weight:      Height:        Physical Examination: General exam: AOX3, morbidly obese but pleasant , NAD HEENT:Oral mucosa moist, Ear/Nose WNL grossly Respiratory system: Bilaterally diminished due to body habitus mostly clear upper airways  Cardiovascular system: S1 & S2 +, No JVD. Gastrointestinal system: Abdomen soft,NT,ND, BS+ Nervous System: Alert, awake, moving all extremities,and following commands. Extremities: extremities warm, leg edema W/ some blister, erythema see picture Skin: Warm, no rashes MSK: Normal muscle bulk,tone, power   Medications reviewed:  Scheduled Meds:  calcium -vitamin D   1 tablet Oral Q breakfast   cefadroxil   500 mg Oral BID   cholecalciferol   2,000 Units Oral QHS   diltiazem   120 mg Oral Daily   DULoxetine   20 mg Oral BID   furosemide   40 mg Oral Daily   insulin  aspart  0-5 Units Subcutaneous QHS   insulin  aspart  0-9 Units Subcutaneous TID WC   insulin  aspart  6 Units Subcutaneous TID WC   insulin  glargine  25 Units Subcutaneous QHS   levothyroxine   100 mcg Oral Q0600   multivitamin with minerals  1 tablet Oral Daily   nortriptyline   75 mg Oral BID   pantoprazole   40 mg Oral Daily   predniSONE   2.5 mg Oral Q breakfast   predniSONE   5 mg Oral QHS   pregabalin   200 mg Oral TID   rivaroxaban   20 mg Oral q AM   rosuvastatin   20 mg Oral QHS   sodium chloride  flush  3 mL Intravenous Q12H   Continuous Infusions: Diet: Diet Order             Diet regular Room service appropriate? Yes; Fluid consistency: Thin; Fluid restriction: 2000 mL Fluid  Diet effective now                    Data Reviewed: I have personally reviewed following labs and imaging studies ( see epic result tab) CBC: Recent Labs  Lab 05/21/24 0806  05/22/24 0502 05/27/24 0745  WBC 6.2 7.7 7.5  NEUTROABS 3.2  --   --   HGB 13.5 12.6* 13.2  HCT 44.8 41.7 42.8  MCV 84.8 85.1 82.5  PLT 257 227 241   CMP: Recent Labs  Lab 05/21/24 0806 05/22/24 0502 05/27/24 0745  NA 138 141 138  K 4.0 4.6 4.3  CL 100 102 100  CO2 26 27 27   GLUCOSE 262* 208* 199*  BUN 19 12 12   CREATININE 1.09 0.96 0.90  CALCIUM  9.2 8.6* 9.1   GFR: Estimated Creatinine Clearance: 209.7 mL/min (by C-G formula based on SCr of 0.9 mg/dL). Recent Labs  Lab 05/21/24 0806 05/22/24 0502  AST 29 25  ALT 20 18  ALKPHOS 90 81  BILITOT 0.4 0.4  PROT 7.4 6.6  ALBUMIN  3.6 3.3*   No results for input(s): LIPASE, AMYLASE in the last 168 hours. No results for input(s): AMMONIA in the last 168 hours.  Coagulation Profile:  Recent Labs  Lab 05/21/24 1043  INR 1.1   Unresulted Labs (From admission, onward)     Start     Ordered   05/28/24 0500  Basic metabolic panel with GFR  Tomorrow morning,   R       Question:  Specimen collection method  Answer:  Lab=Lab collect   05/27/24 1104   05/28/24 0500  CBC  Tomorrow morning,   R       Question:  Specimen collection method  Answer:  Lab=Lab collect   05/27/24 1104           Antimicrobials/Microbiology: Anti-infectives (From admission, onward)    Start     Dose/Rate Route Frequency Ordered Stop   05/26/24 1200  doxycycline  (VIBRA -TABS) tablet 100 mg        100 mg Oral Every 12 hours 05/26/24 1113 05/31/24 0959   05/24/24 1000  cefpodoxime  (VANTIN ) 100 MG/5ML suspension 200 mg  Status:  Discontinued        200 mg Oral Every 12 hours 05/23/24 1551 05/23/24 1610   05/24/24 1000  cefadroxil  (DURICEF) capsule 500 mg        500 mg Oral 2 times daily 05/23/24 1610 05/28/24 0959   05/22/24 1000  cefTRIAXone  (ROCEPHIN ) 2 g in sodium chloride  0.9 % 100 mL IVPB  Status:  Discontinued        2 g 200 mL/hr over 30 Minutes Intravenous Every 24 hours 05/21/24 1348 05/23/24 1551   05/21/24 0945  cefTRIAXone   (ROCEPHIN ) 2 g in sodium chloride  0.9 % 100 mL IVPB        2 g 200 mL/hr over 30 Minutes Intravenous  Once 05/21/24 0930 05/21/24 1112         Component Value Date/Time   SDES BLOOD SITE NOT SPECIFIED 05/21/2024 2204   SPECREQUEST  05/21/2024 2204    BOTTLES DRAWN AEROBIC ONLY Blood Culture results may not be optimal due to an inadequate volume of blood received in culture bottles   CULT  05/21/2024 2204    NO GROWTH 5 DAYS Performed at [facility], [address] [place], [place] [address]    REPTSTATUS 05/26/2024 FINAL 05/21/2024 2204    Procedures:  Mennie LAMY, MD [facility] 05/27/2024, 11:05 AM   "

## 2024-05-27 NOTE — Plan of Care (Signed)
" °  Problem: Clinical Measurements: Goal: Ability to avoid or minimize complications of infection will improve Outcome: Progressing   Problem: Skin Integrity: Goal: Skin integrity will improve Outcome: Progressing   Problem: Education: Goal: Knowledge of General Education information will improve Description: Including pain rating scale, medication(s)/side effects and non-pharmacologic comfort measures Outcome: Progressing   Problem: Health Behavior/Discharge Planning: Goal: Ability to manage health-related needs will improve Outcome: Progressing   Problem: Clinical Measurements: Goal: Ability to maintain clinical measurements within normal limits will improve Outcome: Progressing Goal: Will remain free from infection Outcome: Progressing Goal: Diagnostic test results will improve Outcome: Progressing Goal: Respiratory complications will improve Outcome: Progressing Goal: Cardiovascular complication will be avoided Outcome: Progressing   Problem: Activity: Goal: Risk for activity intolerance will decrease Outcome: Progressing   "

## 2024-05-28 DIAGNOSIS — L03115 Cellulitis of right lower limb: Secondary | ICD-10-CM | POA: Diagnosis not present

## 2024-05-28 LAB — CBC
HCT: 41.4 % (ref 39.0–52.0)
Hemoglobin: 12.6 g/dL — ABNORMAL LOW (ref 13.0–17.0)
MCH: 25.4 pg — ABNORMAL LOW (ref 26.0–34.0)
MCHC: 30.4 g/dL (ref 30.0–36.0)
MCV: 83.5 fL (ref 80.0–100.0)
Platelets: 214 K/uL (ref 150–400)
RBC: 4.96 MIL/uL (ref 4.22–5.81)
RDW: 17.2 % — ABNORMAL HIGH (ref 11.5–15.5)
WBC: 7.9 K/uL (ref 4.0–10.5)
nRBC: 0 % (ref 0.0–0.2)

## 2024-05-28 LAB — BASIC METABOLIC PANEL WITH GFR
Anion gap: 8 (ref 5–15)
BUN: 14 mg/dL (ref 6–20)
CO2: 30 mmol/L (ref 22–32)
Calcium: 8.8 mg/dL — ABNORMAL LOW (ref 8.9–10.3)
Chloride: 99 mmol/L (ref 98–111)
Creatinine, Ser: 0.96 mg/dL (ref 0.61–1.24)
GFR, Estimated: >60 mL/min
Glucose, Bld: 196 mg/dL — ABNORMAL HIGH (ref 70–99)
Potassium: 4.5 mmol/L (ref 3.5–5.1)
Sodium: 138 mmol/L (ref 135–145)

## 2024-05-28 LAB — GLUCOSE, CAPILLARY
Glucose-Capillary: 196 mg/dL — ABNORMAL HIGH (ref 70–99)
Glucose-Capillary: 183 mg/dL — ABNORMAL HIGH (ref 70–99)
Glucose-Capillary: 192 mg/dL — ABNORMAL HIGH (ref 70–99)
Glucose-Capillary: 184 mg/dL — ABNORMAL HIGH (ref 70–99)

## 2024-05-28 MED ORDER — CEFADROXIL 500 MG PO CAPS
500.0000 mg | ORAL_CAPSULE | Freq: Two times a day (BID) | ORAL | Status: DC
  Administered 2024-05-28 – 2024-05-30 (×5): 500 mg via ORAL
  Filled 2024-05-28 (×6): qty 1

## 2024-05-28 NOTE — Progress Notes (Signed)
 " PROGRESS NOTE Carl Hill  FMW:[id number] DOB: 1970/10/09 DOA: 05/21/2024 PCP: Emilio Joesph DEL, PA-C  Brief Narrative/Hospital Course: Carl Hill is a 53 y.o. male with PMH of  of hypertension, hyperlipidemia, diabetes, venous insufficiency, atrial fibrillation, hypothyroidism, adrenal insufficiency, obesity, neuropathy, anemia, asthma, OSA, chronic pain presenting with concern for recurrent cellulitis.Patient has chronic issues with recurrent cellulitis and was recently admitted 12/1-12/7 with right lower extremity cellulitis treated initially with IV antibiotics and then transition to p.o. at discharge to complete a 10-day course. His HH nurse was concerned about cellulitis and advised he come to the ED to be evaluated  AND patient was admitted for further management of cellulitis Overall no leukocytosis, suspect in the setting of immunosuppression and chronic prednisone  with adrenal insufficiency and her worsening edema leading to recurrent cellulitis, at this time patient medically stable will plan for discharge on oral antibiotics x 7 days Blood culture 12/21 NGTD  Subjective: Seen and examined  Slept well.  Resting comfortably.  No new complaints Patient remains hemodynamically stable afebrile, on nocturnal oxygen blood sugar 150-190s much improved  Assessment and plan:  Cellulitis, recurrent Chronic venous insufficiency: Patient reports recurrent right lower extremity cellulitis.  Possible changes on the left as well.  Previously treated for this in the hospital 12/1-12/7.Has had issues with worsening edema leading to blistering in the setting of venous insufficiency and his morbid obesity due to being unable to get a hospital bed at home so far that will fit him (leading to him spending a lot of time upright/sleeping up right). Overall no leukocytosis no fever.patient is immunosuppressed with chronic prednisone  in setting of adrenal insufficiency. Continue with wound care, with  compression.  MRSA screen negative.  Will continue on cefadroxil  and doxycycline  and continue dressing, continue oral Lasix  to optimize swelling and edema It appears patient does not have much help at home since his mother moved to house his father will not be able to come to the house to assist with wound care given difficult social situation at home  [facility] working on SNF placement for wound care    Hypertension HLD Stable, continue home diltiazem .  Continue Crestor    Atrial fibrillation Rate controlled, continue home diltiazem , Xarelto     Diabetes melitis on long-term insulin  with uncontrolled hyperglycemia: PTA on  Humulin 90-100 u tid w/ meals.poorly controlled due to regular diet changed to carb diet.  Blood sugar now optimized after changing diet and adjusting insulin  continue  Lantus  30 u, 8 u Premeal and 0 to 9 units SSI.  Optimize to promote wound healing Recent Labs  Lab 05/23/24 0502 05/23/24 0734 05/27/24 0841 05/27/24 1215 05/27/24 1627 05/27/24 2028 05/28/24 0748  GLUCAP  --    < > 218* 159* 164* 181* 196*  HGBA1C 8.7*  --   --   --   --   --   --    < > = values in this interval not displayed.    Hypothyroidism Continue home Synthroid    Adrenal insufficiency Continue home prednisone  for now, currently normotensive.  Neuropathy Continue home nortriptyline  and Lyrica   Chronic anemia Hemoglobin currently stable, will trend   Asthma Stable,Continue as needed albuterol    Morbid obesity with Body mass index is 63.44 kg/m.: Will benefit with PCP follow-up, weight loss,.  Uses oxygen nocturnally not using CPAP  Mobility: PT Orders: Active PT Follow up Rec: Skilled Nursing-Short Term Rehab (<3 Hours/Day) (For Wound Care)05/24/2024 1028   DVT prophylaxis: On Xarelto  Code Status:   Code  Status: Full Code Family Communication: plan of care discussed with patient at bedside. Patient status is: Remains hospitalized because of severity of illness Level of care:  Telemetry   Dispo: The patient is from: home            Anticipated disposition: Awaiting an SNF  Objective: Vitals last 24 hrs: Vitals:   05/27/24 1803 05/27/24 2031 05/28/24 0441 05/28/24 1005  BP: 120/80 128/85 133/77 129/67  Pulse: 96 96 92 (!) 102  Resp: 18 18 18 16   Temp: 98.4 F (36.9 C) 97.9 F (36.6 C) 97.6 F (36.4 C) 98.2 F (36.8 C)  TempSrc: Oral Oral Oral Oral  SpO2: 95% 98% 99% 96%  Weight:      Height:        Physical Examination: General exam: AAOX3, [place] obese, pleasant HEENT:Oral mucosa moist, Ear/Nose WNL grossly Respiratory system: Bilaterally clear Cardiovascular system: S1 & S2 +, No JVD. Gastrointestinal system: Abdomen soft,NT,ND, BS+ Nervous System: Alert, awake, moving all extremities,and following commands. Extremities: extremities warm, chronic lymphedema with bilateral foot with dressing in place, due for change today Skin: Warm, no rashes MSK: Normal muscle bulk,tone, power   Medications reviewed:  Scheduled Meds:  calcium -vitamin D   1 tablet Oral Q breakfast   cholecalciferol   2,000 Units Oral QHS   diltiazem   120 mg Oral Daily   doxycycline   100 mg Oral Q12H   DULoxetine   20 mg Oral BID   furosemide   40 mg Oral Daily   insulin  aspart  0-5 Units Subcutaneous QHS   insulin  aspart  0-9 Units Subcutaneous TID WC   insulin  aspart  8 Units Subcutaneous TID WC   insulin  glargine  30 Units Subcutaneous QHS   levothyroxine   100 mcg Oral Q0600   multivitamin with minerals  1 tablet Oral Daily   nortriptyline   75 mg Oral BID   pantoprazole   40 mg Oral Daily   predniSONE   2.5 mg Oral Q breakfast   predniSONE   5 mg Oral QHS   pregabalin   200 mg Oral TID   rivaroxaban   20 mg Oral q AM   rosuvastatin   20 mg Oral QHS   sodium chloride  flush  3 mL Intravenous Q12H   Continuous Infusions: Diet: Diet Order             Diet Carb Modified Room service appropriate? Yes; Fluid restriction: 2000 mL Fluid  Diet effective now                     Data Reviewed: I have personally reviewed following labs and imaging studies ( see epic result tab) CBC: Recent Labs  Lab 05/22/24 0502 05/27/24 0745 05/28/24 1105  WBC 7.7 7.5 7.9  HGB 12.6* 13.2 12.6*  HCT 41.7 42.8 41.4  MCV 85.1 82.5 83.5  PLT 227 241 214   CMP: Recent Labs  Lab 05/22/24 0502 05/27/24 0745 05/28/24 1105  NA 141 138 138  K 4.6 4.3 4.5  CL 102 100 99  CO2 27 27 30   GLUCOSE 208* 199* 196*  BUN 12 12 14   CREATININE 0.96 0.90 0.96  CALCIUM  8.6* 9.1 8.8*   GFR: Estimated Creatinine Clearance: 196.6 mL/min (by C-G formula based on SCr of 0.96 mg/dL). Recent Labs  Lab 05/22/24 0502  AST 25  ALT 18  ALKPHOS 81  BILITOT 0.4  PROT 6.6  ALBUMIN  3.3*   No results for input(s): LIPASE, AMYLASE in the last 168 hours. No results for input(s): AMMONIA in the last  168 hours. Coagulation Profile:  No results for input(s): INR, PROTIME in the last 168 hours.  Unresulted Labs (From admission, onward)    None      Antimicrobials/Microbiology: Anti-infectives (From admission, onward)    Start     Dose/Rate Route Frequency Ordered Stop   05/28/24 1300  cefadroxil  (DURICEF) capsule 500 mg        500 mg Oral 2 times daily 05/28/24 1205 05/31/24 0959   05/26/24 1200  doxycycline  (VIBRA -TABS) tablet 100 mg        100 mg Oral Every 12 hours 05/26/24 1113 05/31/24 0959   05/24/24 1000  cefpodoxime  (VANTIN ) 100 MG/5ML suspension 200 mg  Status:  Discontinued        200 mg Oral Every 12 hours 05/23/24 1551 05/23/24 1610   05/24/24 1000  cefadroxil  (DURICEF) capsule 500 mg        500 mg Oral 2 times daily 05/23/24 1610 05/27/24 2108   05/22/24 1000  cefTRIAXone  (ROCEPHIN ) 2 g in sodium chloride  0.9 % 100 mL IVPB  Status:  Discontinued        2 g 200 mL/hr over 30 Minutes Intravenous Every 24 hours 05/21/24 1348 05/23/24 1551   05/21/24 0945  cefTRIAXone  (ROCEPHIN ) 2 g in sodium chloride  0.9 % 100 mL IVPB        2 g 200 mL/hr over 30 Minutes  Intravenous  Once 05/21/24 0930 05/21/24 1112         Component Value Date/Time   SDES BLOOD SITE NOT SPECIFIED 05/21/2024 2204   SPECREQUEST  05/21/2024 2204    BOTTLES DRAWN AEROBIC ONLY Blood Culture results may not be optimal due to an inadequate volume of blood received in culture bottles   CULT  05/21/2024 2204    NO GROWTH 5 DAYS Performed at [facility], [address], [place], [place] [address]    REPTSTATUS 05/26/2024 FINAL 05/21/2024 2204    Procedures:  Mennie LAMY, MD [facility] 05/28/2024, 12:06 PM   "

## 2024-05-28 NOTE — Progress Notes (Signed)
 Mobility Specialist Progress Note:    05/28/24 0943  Mobility  Activity Ambulated with assistance (In hallway)  Level of Assistance Standby assist, set-up cues, supervision of patient - no hands on  Assistive Device Front wheel walker  Distance Ambulated (ft) 140 ft  Activity Response Tolerated well  Mobility Referral Yes  Mobility visit 1 Mobility  Mobility Specialist Start Time (ACUTE ONLY) [id number]  Mobility Specialist Stop Time (ACUTE ONLY) 0943  Mobility Specialist Time Calculation (min) (ACUTE ONLY) 14 min   Received pt in bed and agreeable to mobility. Pt required no physical assistance. C/o RLE discomfort, otherwise tolerated well. Returned to room without fault. Pt's max HR was 133 bpm after ambulation. Left pt in bed. Personal belongings and call light within reach. All needs met.   Lavanda Pollack Mobility Specialist  Please contact via [employer] or  Rehab Office [phone]

## 2024-05-28 NOTE — Plan of Care (Signed)
" °  Problem: Clinical Measurements: Goal: Ability to avoid or minimize complications of infection will improve Outcome: Progressing   Problem: Skin Integrity: Goal: Skin integrity will improve Outcome: Progressing   Problem: Education: Goal: Knowledge of General Education information will improve Description: Including pain rating scale, medication(s)/side effects and non-pharmacologic comfort measures Outcome: Progressing   Problem: Clinical Measurements: Goal: Ability to maintain clinical measurements within normal limits will improve Outcome: Progressing Goal: Will remain free from infection Outcome: Progressing Goal: Respiratory complications will improve Outcome: Progressing Goal: Cardiovascular complication will be avoided Outcome: Progressing   Problem: Activity: Goal: Risk for activity intolerance will decrease Outcome: Progressing   Problem: Nutrition: Goal: Adequate nutrition will be maintained Outcome: Progressing   "

## 2024-05-29 DIAGNOSIS — L03115 Cellulitis of right lower limb: Secondary | ICD-10-CM | POA: Diagnosis not present

## 2024-05-29 LAB — GLUCOSE, CAPILLARY
Glucose-Capillary: 228 mg/dL — ABNORMAL HIGH (ref 70–99)
Glucose-Capillary: 185 mg/dL — ABNORMAL HIGH (ref 70–99)
Glucose-Capillary: 216 mg/dL — ABNORMAL HIGH (ref 70–99)
Glucose-Capillary: 172 mg/dL — ABNORMAL HIGH (ref 70–99)

## 2024-05-29 MED ORDER — INSULIN GLARGINE 100 UNIT/ML ~~LOC~~ SOLN
35.0000 [IU] | Freq: Every day | SUBCUTANEOUS | Status: DC
  Administered 2024-05-29: 35 [IU] via SUBCUTANEOUS
  Filled 2024-05-29 (×2): qty 0.35

## 2024-05-29 NOTE — Progress Notes (Signed)
 Physical Therapy Treatment Patient Details Name: Carl Hill MRN: [id number] DOB: 1970-11-07 Today's Date: 05/29/2024   History of Present Illness Pt is a 53 y.o. M who presents 05/21/2024 with concern for recurrent cellulitis. Significant PMH: hypertension, hyperlipidemia, diabetes, venous insufficiency, atrial fibrillation, hypothyroidism, adrenal insufficiency, obesity, neuropathy, anemia, asthma, OSA, chronic pain.    PT Comments  Pt reports feeling disappointed that he was not approved to go to SNF for wound care. He is agreeable to participate in physical therapy session. Pt limited in mobility this session due to L knee pain and tightness. Pt ambulating 50 ft x 2 with RW with seated rest break in between bouts. Ice applied post session and RN notified for pain medication.     If plan is discharge home, recommend the following: A lot of help with bathing/dressing/bathroom;Assistance with cooking/housework;Assist for transportation;Help with stairs or ramp for entrance   Can travel by private vehicle        Equipment Recommendations  None recommended by PT    Recommendations for Other Services       Precautions / Restrictions Precautions Precautions: Fall Restrictions Weight Bearing Restrictions Per Provider Order: No     Mobility  Bed Mobility Overal bed mobility: Modified Independent                  Transfers Overall transfer level: Modified independent Equipment used: Rolling walker (2 wheels)                    Ambulation/Gait Ambulation/Gait assistance: Supervision Gait Distance (Feet): 50 Feet (50, 50) Assistive device: Rolling walker (2 wheels), None Gait Pattern/deviations: Step-through pattern, Decreased stride length, Trunk flexed, Wide base of support Gait velocity: decreased Gait velocity interpretation: <1.31 ft/sec, indicative of household ambulator   General Gait Details: Slow and effortful pace. One seated rest  break   Stairs             Wheelchair Mobility     Tilt Bed    Modified Rankin (Stroke Patients Only)       Balance Overall balance assessment: Needs assistance Sitting-balance support: Feet supported Sitting balance-Leahy Scale: Good     Standing balance support: No upper extremity supported, During functional activity Standing balance-Leahy Scale: Fair                              Hotel Manager: No apparent difficulties  Cognition Arousal: Alert Behavior During Therapy: WFL for tasks assessed/performed   PT - Cognitive impairments: No apparent impairments                         Following commands: Intact      Cueing Cueing Techniques: Verbal cues  Exercises Other Exercises Other Exercises: x2 sit to stands    General Comments        Pertinent Vitals/Pain Pain Assessment Pain Assessment: Faces Faces Pain Scale: Hurts even more Pain Location: neuropathic pain forearms, hands; L knee Pain Descriptors / Indicators: Constant, Grimacing, Guarding, Tightness (stiff) Pain Intervention(s): Limited activity within patient's tolerance, Monitored during session, Patient requesting pain meds-RN notified, Ice applied    Home Living                          Prior Function            PT Goals (current goals can now be found in  the care plan section) Acute Rehab PT Goals Patient Stated Goal: For wounds to heal Progress towards PT goals: Progressing toward goals    Frequency    Min 2X/week      PT Plan      Co-evaluation              AM-PAC PT 6 Clicks Mobility   Outcome Measure  Help needed turning from your back to your side while in a flat bed without using bedrails?: None Help needed moving from lying on your back to sitting on the side of a flat bed without using bedrails?: None Help needed moving to and from a bed to a chair (including a wheelchair)?: None Help needed  standing up from a chair using your arms (e.g., wheelchair or bedside chair)?: None Help needed to walk in hospital room?: A Little Help needed climbing 3-5 steps with a railing? : A Lot 6 Click Score: 21    End of Session   Activity Tolerance: Patient limited by pain Patient left: in bed;with call bell/phone within reach Nurse Communication: Mobility status;Patient requests pain meds PT Visit Diagnosis: Difficulty in walking, not elsewhere classified (R26.2)     Time: 8556-8495 PT Time Calculation (min) (ACUTE ONLY): 21 min  Charges:    $Therapeutic Activity: 8-22 mins PT General Charges $$ ACUTE PT VISIT: 1 Visit                     Carl Hill, PT, DPT Acute Rehabilitation Services Office [phone]    Carl Hill 05/29/2024, 3:52 PM

## 2024-05-29 NOTE — TOC Progression Note (Addendum)
 Transition of Care (TOC) - Progression Note   No SNF bed offers . SW and NCM  discussed with patient  at bedside. Patient states his mom can assist with dressing change at home but not every day . His dad was helping when his mother was in the hospital. He will ask his dad if he will assist . Patient  is asking for DC not be today , but tomorrow  instead to give him time to get arrangements made. [facility] can accept for [facility] and HHPT,    He is requesting bariatric walker. NCM entered order, asked MD to sign.   Patient requesting ambulance transport home at discharge. NCM explained ambulance will not transport DME at discharge. Vannie will need to be sent to his address. Patient voiced understanding.   Patient confirmed address  MD agreed to discharge tomorrow. Patient aware and in agreement     Patient Details  Name: CASSON CATENA MRN: [id number] Date of Birth: November 30, 1970  Transition of Care [facility]) CM/SW Contact  Saydie Gerdts, Powell Jansky, RN Phone Number: 05/29/2024, 12:14 PM  Clinical Narrative:       Expected Discharge Plan: Skilled Nursing Facility Barriers to Discharge: Continued Medical Work up, English As A Second Language Teacher, SNF Pending bed offer               Expected Discharge Plan and Services In-house Referral: NA Discharge Planning Services: CM Consult Post Acute Care Choice: Home Health, Resumption of Svcs/PTA Provider Living arrangements for the past 2 months: Single Family Home                   DME Agency: NA       HH Arranged: RN, Disease Management, PT, OT HH Agency: [facility] Date HH Agency Contacted: 05/22/24 Time HH Agency Contacted: 1545 Representative spoke with at [facility] Agency: [facility]   Social Drivers of Health (SDOH) Interventions SDOH Screenings   Food Insecurity: No Food Insecurity (05/21/2024)  Housing: Low Risk (05/21/2024)  Transportation Needs: No Transportation Needs (05/21/2024)  Utilities: Not At Risk  (05/21/2024)  Financial Resource Strain: Patient Declined (04/23/2024)   Received from [facility]  Physical Activity: Unknown (04/23/2024)   Received from [facility]  Social Connections: Moderately Integrated (04/23/2024)   Received from [facility]  Stress: Patient Declined (04/23/2024)   Received from [facility]  Tobacco Use: High Risk (05/21/2024)    Readmission Risk Interventions    05/03/2024    4:53 PM  Readmission Risk Prevention Plan  Post Dischage Appt Complete  Medication Screening Complete  Transportation Screening Complete

## 2024-05-29 NOTE — Progress Notes (Signed)
 " PROGRESS NOTE Carl Hill  FMW:[id number] DOB: August 20, 1970 DOA: 05/21/2024 PCP: Emilio Joesph DEL, PA-C  Brief Narrative/Hospital Course: Carl Hill is a 53 y.o. male with PMH of  of hypertension, hyperlipidemia, diabetes, venous insufficiency, atrial fibrillation, hypothyroidism, adrenal insufficiency, obesity, neuropathy, anemia, asthma, OSA, chronic pain presenting with concern for recurrent cellulitis.Patient has chronic issues with recurrent cellulitis and was recently admitted 12/1-12/7 with right lower extremity cellulitis treated initially with IV antibiotics and then transition to p.o. at discharge to complete a 10-day course. His HH nurse was concerned about cellulitis and advised he come to the ED to be evaluated  AND patient was admitted for further management of cellulitis Overall no leukocytosis, suspect in the setting of immunosuppression and chronic prednisone  with adrenal insufficiency and her worsening edema leading to recurrent cellulitis, at this time patient medically stable will plan for discharge on oral antibiotics x 7 days Blood culture 12/21 NGTD Patient continueD on oral antibiotics, local compression wound care leg elevation and overall improving  Subjective: Seen and examined  Patient reports is thinking of going home awaiting to discuss with social worker Remains afebrile BP stable Overall feeling better no new complaints  Assessment and plan:  Cellulitis, recurrent Chronic venous insufficiency: Patient reports recurrent right lower extremity cellulitis.  Possible changes on the left as well.  Previously treated for this in the hospital 12/1-12/7.Has had issues with worsening edema leading to blistering in the setting of venous insufficiency and his morbid obesity due to being unable to get a hospital bed at home so far that will fit him (leading to him spending a lot of time upright/sleeping up right). Overall no leukocytosis no fever.patient is  immunosuppressed with chronic prednisone  in setting of adrenal insufficiency. Will continue on cefadroxil  and doxycycline  for now with local wound care dressing and oral diuretics It appears patient does not have much help at home since his mother moved to house his father will not be able to come to the house to assist with wound care given difficult social situation at home. TOC working ?SNF vs Home HH   Hypertension HLD Stable, continue home diltiazem .  Continue Crestor    Atrial fibrillation Rate controlled, continue home diltiazem , Xarelto     Diabetes melitis on long-term insulin  with uncontrolled hyperglycemia: PTA on  Humulin 90-100 u tid w/ meals.poorly controlled due to regular diet changed to carb diet.  Blood sugar now optimized after changing diet and adjusting insulin  continue  Lantus  30 u, 8 u Premeal and 0 to 9 units SSI.  Optimize to promote wound healing Recent Labs  Lab 05/23/24 0502 05/23/24 0734 05/27/24 0841 05/27/24 1215 05/27/24 1627 05/27/24 2028 05/28/24 0748  GLUCAP  --    < > 218* 159* 164* 181* 196*  HGBA1C 8.7*  --   --   --   --   --   --    < > = values in this interval not displayed.    Hypothyroidism Continue home Synthroid    Adrenal insufficiency Continue home prednisone  for now, currently normotensive.  Neuropathy Continue home nortriptyline  and Lyrica   Chronic anemia Hemoglobin currently stable, will trend   Asthma Stable,Continue as needed albuterol    Morbid obesity with Body mass index is 63.44 kg/m.: Will benefit with PCP follow-up, weight loss,.  Uses oxygen nocturnally not using CPAP  Mobility: PT Orders: Active PT Follow up Rec: Skilled Nursing-Short Term Rehab (<3 Hours/Day) (For Wound Care)05/24/2024 1028   DVT prophylaxis: On Xarelto  Code Status:   Code Status:  Full Code Family Communication: plan of care discussed with patient at bedside. Patient status is: Remains hospitalized because of severity of illness Level of  care: Telemetry   Dispo: The patient is from: home            Anticipated disposition: Awaiting an SNF  Objective: Vitals last 24 hrs: Vitals:   05/27/24 1803 05/27/24 2031 05/28/24 0441 05/28/24 1005  BP: 120/80 128/85 133/77 129/67  Pulse: 96 96 92 (!) 102  Resp: 18 18 18 16   Temp: 98.4 F (36.9 C) 97.9 F (36.6 C) 97.6 F (36.4 C) 98.2 F (36.8 C)  TempSrc: Oral Oral Oral Oral  SpO2: 95% 98% 99% 96%  Weight:      Height:        Physical Examination: General exam: AAOX3 HEENT:Oral mucosa moist, Ear/Nose WNL grossly Respiratory system: Bilaterally clear, nad Cardiovascular system: S1 & S2 +, No JVD. Gastrointestinal system: Abdomen soft, Obese, BS + Nervous System: Alert, awake, moving all extremities Extremities: extremities warm, chronic lymphedema w/ improving swelling erythema blister compressed/some open skin- now in dressing.  with bilateral foot with dressing in place, due for change today Skin: Warm, no rashes MSK: Normal muscle bulk,tone, power   Medications reviewed:  Scheduled Meds:  calcium -vitamin D   1 tablet Oral Q breakfast   cholecalciferol   2,000 Units Oral QHS   diltiazem   120 mg Oral Daily   doxycycline   100 mg Oral Q12H   DULoxetine   20 mg Oral BID   furosemide   40 mg Oral Daily   insulin  aspart  0-5 Units Subcutaneous QHS   insulin  aspart  0-9 Units Subcutaneous TID WC   insulin  aspart  8 Units Subcutaneous TID WC   insulin  glargine  30 Units Subcutaneous QHS   levothyroxine   100 mcg Oral Q0600   multivitamin with minerals  1 tablet Oral Daily   nortriptyline   75 mg Oral BID   pantoprazole   40 mg Oral Daily   predniSONE   2.5 mg Oral Q breakfast   predniSONE   5 mg Oral QHS   pregabalin   200 mg Oral TID   rivaroxaban   20 mg Oral q AM   rosuvastatin   20 mg Oral QHS   sodium chloride  flush  3 mL Intravenous Q12H   Continuous Infusions: Diet: Diet Order             Diet Carb Modified Room service appropriate? Yes; Fluid restriction: 2000  mL Fluid  Diet effective now                    Data Reviewed: I have personally reviewed following labs and imaging studies ( see epic result tab) CBC: Recent Labs  Lab 05/27/24 0745 05/28/24 1105  WBC 7.5 7.9  HGB 13.2 12.6*  HCT 42.8 41.4  MCV 82.5 83.5  PLT 241 214   CMP: Recent Labs  Lab 05/27/24 0745 05/28/24 1105  NA 138 138  K 4.3 4.5  CL 100 99  CO2 27 30  GLUCOSE 199* 196*  BUN 12 14  CREATININE 0.90 0.96  CALCIUM  9.1 8.8*   Unresulted Labs (From admission, onward)    None      Antimicrobials/Microbiology: Anti-infectives (From admission, onward)    Start     Dose/Rate Route Frequency Ordered Stop   05/28/24 1300  cefadroxil  (DURICEF) capsule 500 mg        500 mg Oral 2 times daily 05/28/24 1205 05/31/24 0959   05/26/24 1200  doxycycline  (VIBRA -TABS) tablet 100  mg        100 mg Oral Every 12 hours 05/26/24 1113 05/31/24 0959   05/24/24 1000  cefpodoxime  (VANTIN ) 100 MG/5ML suspension 200 mg  Status:  Discontinued        200 mg Oral Every 12 hours 05/23/24 1551 05/23/24 1610   05/24/24 1000  cefadroxil  (DURICEF) capsule 500 mg        500 mg Oral 2 times daily 05/23/24 1610 05/27/24 2108   05/22/24 1000  cefTRIAXone  (ROCEPHIN ) 2 g in sodium chloride  0.9 % 100 mL IVPB  Status:  Discontinued        2 g 200 mL/hr over 30 Minutes Intravenous Every 24 hours 05/21/24 1348 05/23/24 1551   05/21/24 0945  cefTRIAXone  (ROCEPHIN ) 2 g in sodium chloride  0.9 % 100 mL IVPB        2 g 200 mL/hr over 30 Minutes Intravenous  Once 05/21/24 0930 05/21/24 1112         Component Value Date/Time   SDES BLOOD SITE NOT SPECIFIED 05/21/2024 2204   SPECREQUEST  05/21/2024 2204    BOTTLES DRAWN AEROBIC ONLY Blood Culture results may not be optimal due to an inadequate volume of blood received in culture bottles   CULT  05/21/2024 2204    NO GROWTH 5 DAYS Performed at [facility], [address] [place], [place] [address]    REPTSTATUS 05/26/2024 FINAL  05/21/2024 2204    Procedures:    Mennie LAMY, MD [facility] 05/29/2024, 12:53 PM   "

## 2024-05-29 NOTE — Progress Notes (Signed)
 Occupational Therapy Treatment Patient Details Name: Carl Hill MRN: [id number] DOB: 1971/05/13 Today's Date: 05/29/2024   History of present illness Pt is a 53 y.o. M who presents 05/21/2024 with concern for recurrent cellulitis. Significant PMH: hypertension, hyperlipidemia, diabetes, venous insufficiency, atrial fibrillation, hypothyroidism, adrenal insufficiency, obesity, neuropathy, anemia, asthma, OSA, chronic pain.   OT comments  Pt is progressing towards OT goals. Focus of session on providing education on AE and increasing independent engagement in ADL tasks for safe return home. Educated Pt on use of sock aide, reacher, toilet aide, long handled shoe horn, and long handled sponge. Pt returned verbalization and demonstration. Pt engaged in bed mobility Mod I, limited in further OOB transfers d/t L knee pain. OT to continue to follow Pt acutely to facilitate progress towards goals. Continue per POC.       If plan is discharge home, recommend the following:  A little help with walking and/or transfers;A little help with bathing/dressing/bathroom;Assistance with cooking/housework;Help with stairs or ramp for entrance;Assist for transportation   Equipment Recommendations  None recommended by OT    Recommendations for Other Services      Precautions / Restrictions Precautions Precautions: Fall Recall of Precautions/Restrictions: Intact Precaution/Restrictions Comments: Watch HR Restrictions Weight Bearing Restrictions Per Provider Order: No       Mobility Bed Mobility Overal bed mobility: Modified Independent             General bed mobility comments: Supervision to come to EOB to sit for dinner. Pt educated on use of gait belt to assist in management of LLE onto and off of bed.    Transfers                   General transfer comment: Further transfer deferred this date at Pt request d/t knee pain     Balance Overall balance assessment: Needs  assistance Sitting-balance support: Feet supported Sitting balance-Leahy Scale: Good Sitting balance - Comments: No BUE support                                   ADL either performed or assessed with clinical judgement   ADL Overall ADL's : Needs assistance/impaired Eating/Feeding: Independent       Upper Body Bathing: Minimal assistance;With adaptive equipment   Lower Body Bathing: Moderate assistance;With adaptive equipment       Lower Body Dressing: Moderate assistance;With adaptive equipment       Toileting- Clothing Manipulation and Hygiene: Moderate assistance;With adaptive equipment         General ADL Comments: Educated Pt on use of sock aide, toilet aide, reacher, long handle shoe horn, and long handle sponge    Extremity/Trunk Assessment Upper Extremity Assessment Upper Extremity Assessment: RUE deficits/detail;LUE deficits/detail;Overall [facility] for tasks assessed RUE Deficits / Details: Strength overall WFL, decreased ROM in 4th and 5th digits, pain with ROM of digits. Digits contractured in flexed position at PIP and DIP joints. RUE Sensation: history of peripheral neuropathy RUE Coordination: decreased fine motor LUE Deficits / Details: Strength overall WFL, decreased ROM in 4th and 5th digits, pain with ROM of digits. Digits contractured in flexed position at PIP and DIP joints. LUE Sensation: history of peripheral neuropathy LUE Coordination: decreased fine motor            Vision       Perception     Praxis     Communication Communication Communication: No apparent difficulties  Cognition Arousal: Alert Behavior During Therapy: WFL for tasks assessed/performed Cognition: No apparent impairments             OT - Cognition Comments: Verbose but pleasant and motivated for therapy engagement                 Following commands: Intact        Cueing   Cueing Techniques: Verbal cues, Visual cues  Exercises       Shoulder Instructions       General Comments Pt educated on AE for dressing, bathing, and toileting.    Pertinent Vitals/ Pain       Pain Assessment Pain Assessment: Faces Faces Pain Scale: Hurts whole lot Pain Location: L knee Pain Descriptors / Indicators: Constant, Discomfort, Grimacing, Guarding, Tightness Pain Intervention(s): Limited activity within patient's tolerance, Monitored during session, Repositioned  Home Living                                          Prior Functioning/Environment              Frequency  Min 2X/week        Progress Toward Goals  OT Goals(current goals can now be found in the care plan section)  Progress towards OT goals: Progressing toward goals  Acute Rehab OT Goals Patient Stated Goal: to get better OT Goal Formulation: With patient Time For Goal Achievement: 06/05/24 Potential to Achieve Goals: Good ADL Goals Pt Will Perform Grooming: with modified independence;sitting;standing Pt Will Perform Upper Body Bathing: with set-up;with adaptive equipment;sitting Pt Will Perform Lower Body Bathing: with modified independence;with adaptive equipment;sitting/lateral leans Pt Will Perform Lower Body Dressing: with modified independence;with adaptive equipment;sitting/lateral leans Pt Will Transfer to Toilet: with modified independence;ambulating  Plan      Co-evaluation                 AM-PAC OT 6 Clicks Daily Activity     Outcome Measure   Help from another person eating meals?: None Help from another person taking care of personal grooming?: A Little Help from another person toileting, which includes using toliet, bedpan, or urinal?: A Lot Help from another person bathing (including washing, rinsing, drying)?: A Lot Help from another person to put on and taking off regular upper body clothing?: A Little Help from another person to put on and taking off regular lower body clothing?: A Lot 6 Click  Score: 16    End of Session    OT Visit Diagnosis: Unsteadiness on feet (R26.81);Pain   Activity Tolerance Patient tolerated treatment well   Patient Left in bed;with call bell/phone within reach   Nurse Communication          Time: 8395-8373 OT Time Calculation (min): 22 min  Charges: OT General Charges $OT Visit: 1 Visit OT Treatments $Self Care/Home Management : 8-22 mins  Maurilio CROME, OTR/L.  [facility] Acute Rehabilitation  Office: [phone]   Maurilio PARAS Cherry Wittwer 05/29/2024, 4:47 PM

## 2024-05-29 NOTE — Inpatient Diabetes Management (Signed)
 Inpatient Diabetes Program Recommendations  AACE/ADA: New Consensus Statement on Inpatient Glycemic Control   Target Ranges:  Prepandial:   less than 140 mg/dL      Peak postprandial:   less than 180 mg/dL (1-2 hours)      Critically ill patients:  140 - 180 mg/dL   Lab Results  Component Value Date   GLUCAP 228 (H) 05/29/2024   HGBA1C 8.7 (H) 05/23/2024    Latest Reference Range & Units 05/28/24 07:48 05/28/24 12:05 05/28/24 16:54 05/28/24 20:34 05/29/24 07:59  Glucose-Capillary 70 - 99 mg/dL 803 (H) 816 (H) 807 (H) 184 (H) 228 (H)    Review of Glycemic Control  Diabetes history: DM2  Outpatient Diabetes medications:  Humulin U-500: 100 units with breakfast, 100 units with lunch, 90 units with dinner  Mounjaro 10mg  weekly   Current orders for Inpatient glycemic control:  Lantus  30 units daily Novolog  0-9 units TID + 0-5 units at bedtime  Novolog  8 units TID with meals   Inpatient Diabetes Program Recommendations:   Noted diabetes coordinator spoke with patient on 05/05/24 during previous admission.   Please consider increasing Lantus  to 35 units daily.   Thanks,  Lavanda Search, RN, MSN, [facility]  Inpatient Diabetes Coordinator  Pager [phone] (8a-5p)

## 2024-05-29 NOTE — TOC Progression Note (Addendum)
 Transition of Care [facility]) - Progression Note    Patient Details  Name: Carl Hill MRN: [id number] Date of Birth: 05-07-1971  Transition of Care [facility]) CM/SW Contact  Izick Gasbarro LITTIE [place], [place] Phone Number: 05/29/2024, 11:00 AM  Clinical Narrative:    CSW spoke with pt at bedside rgearding SNF vs home. Pt stated he would like to go to [place] [facility] before returning home. CSW will continue to follow.  11:06AM- CSW received a message from [place] with [facility]- facility is unable to accept pt as they did not see his weight during initial referral review. CSW will continue to follow.  Expected Discharge Plan: Skilled Nursing Facility Barriers to Discharge: Continued Medical Work up, English As A Second Language Teacher, SNF Pending bed offer               Expected Discharge Plan and Services In-house Referral: NA Discharge Planning Services: CM Consult Post Acute Care Choice: Home Health, Resumption of Svcs/PTA Provider Living arrangements for the past 2 months: Single Family Home                   DME Agency: NA       HH Arranged: RN, Disease Management, PT, OT HH Agency: [facility] Date HH Agency Contacted: 05/22/24 Time HH Agency Contacted: 1545 Representative spoke with at [facility] Agency: [facility]   Social Drivers of Health (SDOH) Interventions SDOH Screenings   Food Insecurity: No Food Insecurity (05/21/2024)  Housing: Low Risk (05/21/2024)  Transportation Needs: No Transportation Needs (05/21/2024)  Utilities: Not At Risk (05/21/2024)  Financial Resource Strain: Patient Declined (04/23/2024)   Received from [facility]  Physical Activity: Unknown (04/23/2024)   Received from [facility]  Social Connections: Moderately Integrated (04/23/2024)   Received from [facility]  Stress: Patient Declined (04/23/2024)   Received from [facility]  Tobacco Use: High Risk (05/21/2024)    Readmission Risk Interventions    05/03/2024    4:53 PM   Readmission Risk Prevention Plan  Post Dischage Appt Complete  Medication Screening Complete  Transportation Screening Complete

## 2024-05-30 ENCOUNTER — Other Ambulatory Visit (HOSPITAL_COMMUNITY): Payer: Self-pay

## 2024-05-30 DIAGNOSIS — L03115 Cellulitis of right lower limb: Secondary | ICD-10-CM | POA: Diagnosis not present

## 2024-05-30 LAB — GLUCOSE, CAPILLARY
Glucose-Capillary: 268 mg/dL — ABNORMAL HIGH (ref 70–99)
Glucose-Capillary: 205 mg/dL — ABNORMAL HIGH (ref 70–99)

## 2024-05-30 MED ORDER — DOXYCYCLINE HYCLATE 100 MG PO TABS
100.0000 mg | ORAL_TABLET | Freq: Two times a day (BID) | ORAL | 0 refills | Status: AC
  Filled 2024-05-30: qty 6, 3d supply, fill #0

## 2024-05-30 MED ORDER — FUROSEMIDE 20 MG PO TABS
20.0000 mg | ORAL_TABLET | Freq: Every day | ORAL | 0 refills | Status: AC
  Filled 2024-05-30: qty 30, 30d supply, fill #0

## 2024-05-30 MED ORDER — CEFADROXIL 500 MG PO CAPS
500.0000 mg | ORAL_CAPSULE | Freq: Two times a day (BID) | ORAL | 0 refills | Status: AC
  Filled 2024-05-30: qty 6, 3d supply, fill #0

## 2024-05-30 NOTE — Consult Note (Signed)
 Secure Chat discussion regarding current wound care orders which I recommend should continue upon discharge, primary nurse should take discharging images of legs. No other needs at this time. Patient should be followed by provider or wound clinic upon after discharge.   Please reconsult if wound worsens in condition and notify provider.   Sherrilyn Hals MSN RN CWOCN WOC [facility]  [phone] (Available from 7-3 pm Mon-Friday)

## 2024-05-30 NOTE — Discharge Summary (Signed)
 Physician Discharge Summary  Carl Hill FMW:[id number] DOB: 27-Sep-1970 DOA: 05/21/2024 PCP: Emilio Joesph DEL, PA-C Admit date: 05/21/2024 Discharge date: 05/30/2024  Recommendations for Outpatient Follow-up:  Follow up with PCP in 1 weeks-call for appointment. Please obtain BMP/CBC in one week.  Discharge Dispo:Home w/ [facility] Discharge Condition: Stable Code Status:   Code Status: Full Code Diet recommendation:  Diet Order             Diet Carb Modified           Diet Carb Modified Room service appropriate? Yes; Fluid restriction: 2000 mL Fluid  Diet effective now                   Brief/Interim Summary: Carl Hill is a 53 y.o. male with PMH of  of hypertension, hyperlipidemia, diabetes, venous insufficiency, atrial fibrillation, hypothyroidism, adrenal insufficiency, obesity, neuropathy, anemia, asthma, OSA, chronic pain presenting with concern for recurrent cellulitis.Patient has chronic issues with recurrent cellulitis and was recently admitted 12/1-12/7 with right lower extremity cellulitis treated initially with IV antibiotics and then transition to p.o. at discharge to complete a 10-day course. His HH nurse was concerned about cellulitis and advised he come to the ED to be evaluated  AND patient was admitted for further management of cellulitis Overall no leukocytosis, suspect in the setting of immunosuppression and chronic prednisone  with adrenal insufficiency and her worsening edema leading to recurrent cellulitis, at this time patient medically stable will plan for discharge on oral antibiotics x 7 days Blood culture 12/21 NGTD Patient continueD on oral antibiotics, local compression wound care leg elevation and overall improving. Initially planning for SNF but at this time patient is deciding to go home with home health with support from family   Subjective: Seen and examined  He feels well and agree for discharge to home today Afebrile vital stable blood sugar  borderline He feels his leg wound has significantly improved  Discharge Diagnoses:   Bilateral leg Cellulitis, recurrent Chronic venous insufficiency: Patient reports recurrent right lower extremity cellulitis.  Possible changes on the left as well.  Previously treated for this in the hospital 12/1-12/7.Has had issues with worsening edema leading to blistering in the setting of venous insufficiency and his morbid obesity due to being unable to get a hospital bed at home so far that will fit him (leading to him spending a lot of time upright/sleeping up right). Overall no leukocytosis no fever.patient is immunosuppressed with chronic prednisone  in setting of adrenal insufficiency. Will continue on cefadroxil  and doxycycline  along w/ wound care and lasix  -wound care nurse further evaluated for discharge with recommendation and follow-up with wound clinic . It appears patient does not have much help at home since his mother moved to house his father will not be able to come to the house to assist with wound care given difficult social situation at home. TOC working ?SNF vs Home HH   Hypertension HLD: Stable, continue home diltiazem .  Continue Crestor    Atrial fibrillation Rate controlled, continue home diltiazem , Xarelto     Diabetes melitis on long-term insulin  with uncontrolled hyperglycemia: PTA on  Humulin 90-100 u tid w/ meals.poorly controlled due to regular diet changed to carb diet.Insulin  dosing adjusted BUT ON DC he will resume home insulin  and monjourao. Lab Results  Component Value Date   HGBA1C 8.7 (H) 05/23/2024    Recent Labs  Lab 05/29/24 0759 05/29/24 1232 05/29/24 1739 05/29/24 2109 05/30/24 0753  GLUCAP 228* 185* 216* 172* 268*  Hypothyroidism Continue home Synthroid    Adrenal insufficiency Continue home prednisone  for now, currently normotensive.  Neuropathy Continue home nortriptyline  and Lyrica   Chronic anemia Hemoglobin currently stable, will trend    Asthma Stable,Continue as needed albuterol    Morbid obesity with Body mass index is 63.44 kg/m.: Will benefit with PCP follow-up, weight loss,.  Uses oxygen nocturnally not using CPAP  Mobility: PT Orders: Active PT Follow up Rec: Home Health Pt12/29/2025 1548   DVT prophylaxis: On Xarelto  Code Status:   Code Status: Full Code Family Communication: plan of care discussed with patient at bedside. Patient status is: Remains hospitalized because of severity of illness Level of care: Telemetry   Dispo: The patient is from: home            Anticipated disposition: Awaiting an SNF  Objective: Vitals last 24 hrs: Vitals:   05/29/24 1857 05/29/24 2113 05/30/24 0534 05/30/24 0912  BP: 126/63 132/75 (!) 154/77 (!) 151/80  Pulse: 100 95 94 99  Resp: 16 17 19 17   Temp: 97.9 F (36.6 C) 97.7 F (36.5 C) 98.4 F (36.9 C) 98 F (36.7 C)  TempSrc: Oral Oral    SpO2: 96% 99% 91% 90%  Weight:      Height:       Physical Examination: General exam: AAOX3 HEENT:Oral mucosa moist, Ear/Nose WNL grossly Respiratory system: Bilaterally clear, nad Cardiovascular system: S1 & S2 +, No JVD. Gastrointestinal system: Abdomen soft, Obese, BS + Nervous System: Alert, awake, moving all extremities Extremities: extremities warm, chronic lymphedema w/ improving swelling erythema  on legs Skin: Warm, no rashes MSK: Normal muscle bulk,tone, power     Consultation: WOCN  Discharge Instructions Discharge Instructions     Diet Carb Modified   Complete by: As directed    Discharge instructions   Complete by: As directed    Please call call MD or return to ER for similar or worsening recurring problem that brought you to hospital or if any fever,nausea/vomiting,abdominal pain, uncontrolled pain, chest pain,  shortness of breath or any other alarming symptoms.  Please follow-up your doctor as instructed in a week time and call the office for appointment.  Please avoid alcohol, smoking, or any  other illicit substance and maintain healthy habits including taking your regular medications as prescribed.  You were cared for by a hospitalist during your hospital stay. If you have any questions about your discharge medications or the care you received while you were in the hospital after you are discharged, you can call the unit and ask to speak with the hospitalist on call if the hospitalist that took care of you is not available.  Once you are discharged, your primary care physician will handle any further medical issues. Please note that NO REFILLS for any discharge medications will be authorized once you are discharged, as it is imperative that you return to your primary care physician (or establish a relationship with a primary care physician if you do not have one) for your aftercare needs so that they can reassess your need for medications and monitor your lab values   Discharge wound care:   Complete by: As directed    Cleanse B lower legs with Vashe (intact skin and wounds), do not rinse and allow to air dry. Apply Xeroform gauze to open and bullous wound beds daily, cover with ABD pad and secure with Kerlix roll gauze beginning right above toes and ending right below knees.  Secure with Ace bandage wrapped in same fashion as  Kerlix for light compression.  Elevate legs as much as possible   Increase activity slowly   Complete by: As directed       Allergies as of 05/30/2024       Reactions   Bee Venom Anaphylaxis   Other reaction(s): Unknown Other reaction(s): Unknown Other reaction(s): Unknown   Sglt2 Inhibitors Other (See Comments)   Necrotizing infection of the perineum   Other Hives        Medication List     TAKE these medications    acetaminophen  500 MG tablet Commonly known as: TYLENOL  Take 500-1,000 mg by mouth every 6 (six) hours as needed for headache or mild pain (pain score 1-3).   albuterol  108 (90 Base) MCG/ACT inhaler Commonly known as: VENTOLIN   HFA Inhale 1-2 puffs into the lungs every 6 (six) hours as needed for wheezing or shortness of breath.   ALIVE MENS ENERGY PO Take 1 tablet by mouth daily.   CALCIUM  1200 PO Take 1 tablet by mouth daily.   cefadroxil  500 MG capsule Commonly known as: DURICEF Take 1 capsule (500 mg total) by mouth 2 (two) times daily for 3 days.   Cholecalciferol  50 MCG (2000 UT) Tabs Take 2,000 Units by mouth at bedtime.   diltiazem  120 MG 24 hr capsule Commonly known as: CARDIZEM  CD Take 120 mg by mouth daily.   docusate sodium  100 MG capsule Commonly known as: COLACE Take 100 mg by mouth at bedtime.   doxycycline  100 MG tablet Commonly known as: VIBRA -TABS Take 1 tablet (100 mg total) by mouth every 12 (twelve) hours for 3 days.   DULoxetine  20 MG capsule Commonly known as: CYMBALTA  Take 20 mg by mouth 2 (two) times daily. For neuropathy   furosemide  20 MG tablet Commonly known as: LASIX  Take 1 tablet (20 mg total) by mouth daily. Start taking on: May 31, 2024   Gerhardt's butt cream Crea Apply 1 Application topically 3 (three) times daily. What changed:  when to take this reasons to take this   HumuLIN R  U-500 KwikPen 500 UNIT/ML KwikPen Generic drug: insulin  regular human CONCENTRATED Inject 90-100 Units into the skin See admin instructions. Inject 100 Units into the skin 30 (thirty) minutes before breakfast AND 100 Units 30 (thirty) minutes before lunch AND 90 Units 30 (thirty) minutes before supper   levothyroxine  100 MCG tablet Commonly known as: SYNTHROID  Take 100 mcg by mouth daily.   magnesium  oxide 400 (240 Mg) MG tablet Commonly known as: MAG-OX Take 400 mg by mouth at bedtime.   Mounjaro 10 MG/0.5ML Pen Generic drug: tirzepatide Inject 10 mg into the skin every Sunday.   nortriptyline  75 MG capsule Commonly known as: PAMELOR  Take 75 mg by mouth 2 (two) times daily. For neuropathy   OMEGA 3-6-9 PO Take 1 capsule by mouth at bedtime.   omeprazole 20 MG  capsule Commonly known as: PRILOSEC Take 20 mg by mouth 2 (two) times daily.   OXYGEN Inhale 2 L into the lungs at bedtime as needed (low O2 sat).   polyethylene glycol 17 g packet Commonly known as: MIRALAX  / GLYCOLAX  Take 17 g by mouth daily. What changed:  when to take this reasons to take this   predniSONE  5 MG tablet Commonly known as: DELTASONE  Take 2.5-5 mg by mouth See admin instructions. Take one-half (2.5mg ) tablet by mouth in the morning and one tablet (5mg ) at bedtime   pregabalin  200 MG capsule Commonly known as: LYRICA  Take 200 mg by mouth 3 (three)  times daily.   QC TUMERIC COMPLEX PO Take 2,250 mg by mouth at bedtime. QUNOL TURMERIC with BLACK PEPPER & GINGER 2250mg  per capsule   rivaroxaban  20 MG Tabs tablet Commonly known as: XARELTO  Take 1 tablet (20 mg total) by mouth daily with supper. What changed: when to take this   rosuvastatin  20 MG tablet Commonly known as: CRESTOR  Take 20 mg by mouth at bedtime.               Durable Medical Equipment  (From admission, onward)           Start     Ordered   05/29/24 1218  For home use only DME Walker rolling  Once       Comments: Bariatric   Ht 6'6 wt 249 kg  Question Answer Comment  Walker: With 5 Inch Wheels   Patient needs a walker to treat with the following condition Weakness      05/29/24 1218              Discharge Care Instructions  (From admission, onward)           Start     Ordered   05/30/24 0000  Discharge wound care:       Comments: Cleanse B lower legs with Vashe (intact skin and wounds), do not rinse and allow to air dry. Apply Xeroform gauze to open and bullous wound beds daily, cover with ABD pad and secure with Kerlix roll gauze beginning right above toes and ending right below knees.  Secure with Ace bandage wrapped in same fashion as Kerlix for light compression.  Elevate legs as much as possible   05/30/24 1119            Contact information for  follow-up providers     Emilio Search H, PA-C Follow up in 1 week(s).   Specialty: Physician Assistant Contact information: [address] [place] [address] [phone]              Contact information for after-discharge care     Durable Medical Equipment     [facility] (DME) .   Service: Durable Medical Equipment Contact information: [address] [place]  [address] [phone]             Home Medical Care     [facility]) Follow up.   Service: Home Health Services Why: Registered Nurse, Physical and Occupational Therapy-office to call with visit times. Contact information: [address] [place]  [address] [phone]                    Allergies[1]  The results of significant diagnostics from this hospitalization (including imaging, microbiology, ancillary and laboratory) are listed below for reference.    Microbiology: Recent Results (from the past 240 hours)  Culture, blood (Routine x 2)     Status: None   Collection Time: 05/21/24  8:42 AM   Specimen: BLOOD LEFT FOREARM  Result Value Ref Range Status   Specimen Description BLOOD LEFT FOREARM  Final   Special Requests   Final    BOTTLES DRAWN AEROBIC AND ANAEROBIC Blood Culture results may not be optimal due to an inadequate volume of blood received in culture bottles   Culture   Final    NO GROWTH 5 DAYS Performed at [facility], [address] [place], [place] [address]  Report Status 05/26/2024 FINAL  Final  Culture, blood (Routine x 2)     Status: None   Collection Time: 05/21/24  8:47 AM   Specimen: BLOOD LEFT HAND  Result Value Ref Range Status   Specimen Description BLOOD LEFT HAND  Final   Special Requests   Final    BOTTLES DRAWN AEROBIC AND ANAEROBIC Blood Culture adequate volume   Culture   Final    NO GROWTH 5 DAYS Performed at  [facility], [address], [place], [place] [address]    Report Status 05/26/2024 FINAL  Final  Blood Culture (routine x 2)     Status: None   Collection Time: 05/21/24  9:51 PM   Specimen: BLOOD  Result Value Ref Range Status   Specimen Description BLOOD SITE NOT SPECIFIED  Final   Special Requests   Final    BOTTLES DRAWN AEROBIC AND ANAEROBIC Blood Culture adequate volume   Culture   Final    NO GROWTH 5 DAYS Performed at [facility], [address], [place], [place] [address]    Report Status 05/26/2024 FINAL  Final  Blood Culture (routine x 2)     Status: None   Collection Time: 05/21/24 10:04 PM   Specimen: BLOOD  Result Value Ref Range Status   Specimen Description BLOOD SITE NOT SPECIFIED  Final   Special Requests   Final    BOTTLES DRAWN AEROBIC ONLY Blood Culture results may not be optimal due to an inadequate volume of blood received in culture bottles   Culture   Final    NO GROWTH 5 DAYS Performed at [facility], [address], [place], [place] [address]    Report Status 05/26/2024 FINAL  Final  MRSA Next Gen by PCR, Nasal     Status: None   Collection Time: 05/26/24 11:14 AM   Specimen: Nasal Mucosa; Nasal Swab  Result Value Ref Range Status   MRSA by PCR Next Gen NOT DETECTED NOT DETECTED Final    Comment: (NOTE) The GeneXpert MRSA Assay (FDA approved for NASAL specimens only), is one component of a comprehensive MRSA colonization surveillance program. It is not intended to diagnose MRSA infection nor to guide or monitor treatment for MRSA infections. Test performance is not FDA approved in patients less than 40 years old. Performed at [facility], [address] [place], [place] [address]   Procedures/Studies: DG Chest Port 1 View Result Date: 05/21/2024 EXAM: 1 VIEW XRAY OF THE CHEST 05/21/2024 09:40:00 AM COMPARISON: 08/03/2022 CLINICAL HISTORY: Questionable sepsis - evaluate for abnormality FINDINGS: LUNGS AND PLEURA: Low  lung volumes. Limited detailed assessment due to habitus. No focal pulmonary opacity. No pleural effusion. No pneumothorax. HEART AND MEDIASTINUM: No acute abnormality of the cardiac and mediastinal silhouettes. BONES AND SOFT TISSUES: No acute osseous abnormality. IMPRESSION: 1. No acute cardiopulmonary abnormality. Electronically signed by: Waddell Calk MD 05/21/2024 09:49 AM EST RP Workstation: [id number]  Labs: BNP (last 3 results) No results for input(s): BNP in the last 8760 hours. Basic Metabolic Panel: Recent Labs  Lab 05/27/24 0745 05/28/24 1105  NA 138 138  K 4.3 4.5  CL 100 99  CO2 27 30  GLUCOSE 199* 196*  BUN 12 14  CREATININE 0.90 0.96  CALCIUM  9.1 8.8*  Liver Function Tests: No results for input(s): AST, ALT, ALKPHOS, BILITOT, PROT, ALBUMIN  in the last 168 hours. No results for input(s): LIPASE, AMYLASE in the last 168 hours. No results for  input(s): AMMONIA in the last 168 hours. CBC: Recent Labs  Lab 05/27/24 0745 05/28/24 1105  WBC 7.5 7.9  HGB 13.2 12.6*  HCT 42.8 41.4  MCV 82.5 83.5  PLT 241 214   CBG: Recent Labs  Lab 05/29/24 0759 05/29/24 1232 05/29/24 1739 05/29/24 2109 05/30/24 0753  GLUCAP 228* 185* 216* 172* 268*  No results for input(s): TSH, T4TOTAL, T3FREE, THYROIDAB in the last 72 hours.  Invalid input(s): FREET3 Urinalysis    Component Value Date/Time   COLORURINE YELLOW 05/22/2024 0455   APPEARANCEUR CLEAR 05/22/2024 0455   LABSPEC 1.010 05/22/2024 0455   PHURINE 5.5 05/22/2024 0455   GLUCOSEU NEGATIVE 05/22/2024 0455   HGBUR TRACE (A) 05/22/2024 0455   BILIRUBINUR NEGATIVE 05/22/2024 0455   KETONESUR NEGATIVE 05/22/2024 0455   PROTEINUR NEGATIVE 05/22/2024 0455   NITRITE NEGATIVE 05/22/2024 0455   LEUKOCYTESUR NEGATIVE 05/22/2024 0455  Sepsis Labs Recent Labs  Lab 05/27/24 0745 05/28/24 1105  WBC 7.5 7.9  Microbiology Recent Results (from the past 240 hours)  Culture, blood (Routine x 2)      Status: None   Collection Time: 05/21/24  8:42 AM   Specimen: BLOOD LEFT FOREARM  Result Value Ref Range Status   Specimen Description BLOOD LEFT FOREARM  Final   Special Requests   Final    BOTTLES DRAWN AEROBIC AND ANAEROBIC Blood Culture results may not be optimal due to an inadequate volume of blood received in culture bottles   Culture   Final    NO GROWTH 5 DAYS Performed at [facility], [address], [place], [place] [address]    Report Status 05/26/2024 FINAL  Final  Culture, blood (Routine x 2)     Status: None   Collection Time: 05/21/24  8:47 AM   Specimen: BLOOD LEFT HAND  Result Value Ref Range Status   Specimen Description BLOOD LEFT HAND  Final   Special Requests   Final    BOTTLES DRAWN AEROBIC AND ANAEROBIC Blood Culture adequate volume   Culture   Final    NO GROWTH 5 DAYS Performed at [facility], [address], [place], [place] [address]    Report Status 05/26/2024 FINAL  Final  Blood Culture (routine x 2)     Status: None   Collection Time: 05/21/24  9:51 PM   Specimen: BLOOD  Result Value Ref Range Status   Specimen Description BLOOD SITE NOT SPECIFIED  Final   Special Requests   Final    BOTTLES DRAWN AEROBIC AND ANAEROBIC Blood Culture adequate volume   Culture   Final    NO GROWTH 5 DAYS Performed at [facility], [address], [place], [place] [address]    Report Status 05/26/2024 FINAL  Final  Blood Culture (routine x 2)     Status: None   Collection Time: 05/21/24 10:04 PM   Specimen: BLOOD  Result Value Ref Range Status   Specimen Description BLOOD SITE NOT SPECIFIED  Final   Special Requests   Final    BOTTLES DRAWN AEROBIC ONLY Blood Culture results may not be optimal due to an inadequate volume of blood received in culture bottles   Culture   Final    NO GROWTH 5 DAYS Performed at [facility], [address], [place], [place] [address]    Report Status 05/26/2024 FINAL  Final  MRSA Next Gen by PCR,  Nasal     Status: None   Collection Time: 05/26/24  11:14 AM   Specimen: Nasal Mucosa; Nasal Swab  Result Value Ref Range Status   MRSA by PCR Next Gen NOT DETECTED NOT DETECTED Final    Comment: (NOTE) The GeneXpert MRSA Assay (FDA approved for NASAL specimens only), is one component of a comprehensive MRSA colonization surveillance program. It is not intended to diagnose MRSA infection nor to guide or monitor treatment for MRSA infections. Test performance is not FDA approved in patients less than 28 years old. Performed at [facility], [address] [place], [place] [address]    Time coordinating discharge:  35 minutes SIGNED: Mennie LAMY, MD  [facility] 05/30/2024, 11:23 AM  If 7PM-7AM, please contact night-coverage [email or web address]       [1]  Allergies Allergen Reactions   Bee Venom Anaphylaxis    Other reaction(s): Unknown Other reaction(s): Unknown Other reaction(s): Unknown    Sglt2 Inhibitors Other (See Comments)    Necrotizing infection of the perineum   Other Hives

## 2024-05-30 NOTE — TOC Progression Note (Addendum)
 Transition of Care (TOC) - Progression Note   [employer] delivered walker to hospital room . Patient reports his father works for [employer] and [employer] will transport his walker.   Called [facility] spoke to Odon and she will put a note in for crew to transport walker. Patient on Will Call with [facility]. Sent secure chat to nurse, will call [facility] back when nurse is ready   Patient ready. Discharge lounge and nurse requesting pick up at the discharge lounge. Called [facility] back spoke to [place] , no time given , they will pick up in discharge lounge   [facility] notified of discharge  Patient Details  Name: Carl Hill MRN: [id number] Date of Birth: [birth year]  Transition of Care [facility]) CM/SW Contact  Shariece Viveiros, Powell Jansky, RN Phone Number: 05/30/2024, 11:57 AM  Clinical Narrative:       Expected Discharge Plan: Skilled Nursing Facility Barriers to Discharge: Continued Medical Work up, English As A Second Language Teacher, SNF Pending bed offer               Expected Discharge Plan and Services In-house Referral: NA Discharge Planning Services: CM Consult Post Acute Care Choice: Home Health, Resumption of Svcs/PTA Provider Living arrangements for the past 2 months: Single Family Home Expected Discharge Date: 05/30/24                 DME Agency: NA       HH Arranged: RN, Disease Management, PT, OT HH Agency: [facility] ([facility]) Date HH Agency Contacted: 05/22/24 Time HH Agency Contacted: [id number] Representative spoke with at [facility] Agency: [facility]   Social Drivers of Health (SDOH) Interventions SDOH Screenings   Food Insecurity: No Food Insecurity (05/21/2024)  Housing: Low Risk (05/21/2024)  Transportation Needs: No Transportation Needs (05/21/2024)  Utilities: Not At Risk (05/21/2024)  Financial Resource Strain: Patient Declined (04/23/2024)   Received from [facility]  Physical Activity: Unknown (04/23/2024)   Received from [facility]  Social Connections: Moderately Integrated  (04/23/2024)   Received from [facility]  Stress: Patient Declined (04/23/2024)   Received from [facility]  Tobacco Use: High Risk (05/21/2024)    Readmission Risk Interventions    05/03/2024    4:53 PM  Readmission Risk Prevention Plan  Post Dischage Appt Complete  Medication Screening Complete  Transportation Screening Complete

## 2024-05-30 NOTE — Plan of Care (Signed)
" °  Problem: Skin Integrity: Goal: Skin integrity will improve Outcome: Progressing   Problem: Clinical Measurements: Goal: Ability to maintain clinical measurements within normal limits will improve Outcome: Progressing   Problem: Coping: Goal: Level of anxiety will decrease Outcome: Progressing   Problem: Pain Managment: Goal: General experience of comfort will improve and/or be controlled Outcome: Progressing   "

## 2024-06-05 ENCOUNTER — Emergency Department (HOSPITAL_COMMUNITY)

## 2024-06-05 ENCOUNTER — Encounter (HOSPITAL_COMMUNITY): Payer: Self-pay

## 2024-06-05 ENCOUNTER — Emergency Department (HOSPITAL_COMMUNITY)
Admission: EM | Admit: 2024-06-05 | Discharge: 2024-06-05 | Disposition: A | Discharge status: Home / Self Care | Attending: Emergency Medicine | Admitting: Emergency Medicine

## 2024-06-05 DIAGNOSIS — R062 Wheezing: Secondary | ICD-10-CM | POA: Diagnosis not present

## 2024-06-05 DIAGNOSIS — J189 Pneumonia, unspecified organism: Secondary | ICD-10-CM

## 2024-06-05 DIAGNOSIS — R000 Tachycardia, unspecified: Secondary | ICD-10-CM | POA: Insufficient documentation

## 2024-06-05 DIAGNOSIS — I10 Essential (primary) hypertension: Secondary | ICD-10-CM | POA: Diagnosis not present

## 2024-06-05 DIAGNOSIS — Z794 Long term (current) use of insulin: Secondary | ICD-10-CM | POA: Diagnosis not present

## 2024-06-05 DIAGNOSIS — Z9981 Dependence on supplemental oxygen: Secondary | ICD-10-CM | POA: Insufficient documentation

## 2024-06-05 DIAGNOSIS — Z7901 Long term (current) use of anticoagulants: Secondary | ICD-10-CM | POA: Insufficient documentation

## 2024-06-05 DIAGNOSIS — R0602 Shortness of breath: Secondary | ICD-10-CM | POA: Diagnosis present

## 2024-06-05 DIAGNOSIS — I872 Venous insufficiency (chronic) (peripheral): Secondary | ICD-10-CM | POA: Insufficient documentation

## 2024-06-05 DIAGNOSIS — E1165 Type 2 diabetes mellitus with hyperglycemia: Secondary | ICD-10-CM | POA: Diagnosis not present

## 2024-06-05 DIAGNOSIS — N179 Acute kidney failure, unspecified: Secondary | ICD-10-CM | POA: Insufficient documentation

## 2024-06-05 DIAGNOSIS — N201 Calculus of ureter: Secondary | ICD-10-CM

## 2024-06-05 LAB — URINALYSIS, ROUTINE W REFLEX MICROSCOPIC
Bacteria, UA: NONE SEEN
Bilirubin Urine: NEGATIVE
Glucose, UA: >=500 mg/dL — AB
Hgb urine dipstick: NEGATIVE
Ketones, ur: NEGATIVE mg/dL
Leukocytes,Ua: NEGATIVE
Nitrite: NEGATIVE
Protein, ur: NEGATIVE mg/dL
Specific Gravity, Urine: 1.043 — ABNORMAL HIGH (ref 1.005–1.030)
pH: 5 (ref 5.0–8.0)

## 2024-06-05 LAB — CBC WITH DIFFERENTIAL/PLATELET
Abs Immature Granulocytes: 0.03 K/uL (ref 0.00–0.07)
Basophils Absolute: 0.1 K/uL (ref 0.0–0.1)
Basophils Relative: 1 %
Eosinophils Absolute: 0.1 K/uL (ref 0.0–0.5)
Eosinophils Relative: 2 %
HCT: 42.5 % (ref 39.0–52.0)
Hemoglobin: 13.2 g/dL (ref 13.0–17.0)
Immature Granulocytes: 1 %
Lymphocytes Relative: 33 %
Lymphs Abs: 1.9 K/uL (ref 0.7–4.0)
MCH: 26 pg (ref 26.0–34.0)
MCHC: 31.1 g/dL (ref 30.0–36.0)
MCV: 83.8 fL (ref 80.0–100.0)
Monocytes Absolute: 0.6 K/uL (ref 0.1–1.0)
Monocytes Relative: 10 %
Neutro Abs: 3 K/uL (ref 1.7–7.7)
Neutrophils Relative %: 53 %
Platelets: UNDETERMINED K/uL (ref 150–400)
RBC: 5.07 MIL/uL (ref 4.22–5.81)
RDW: 17.2 % — ABNORMAL HIGH (ref 11.5–15.5)
WBC: 5.8 K/uL (ref 4.0–10.5)
nRBC: 0 % (ref 0.0–0.2)

## 2024-06-05 LAB — BASIC METABOLIC PANEL WITH GFR
Anion gap: 14 (ref 5–15)
BUN: 21 mg/dL — ABNORMAL HIGH (ref 6–20)
CO2: 24 mmol/L (ref 22–32)
Calcium: 8.7 mg/dL — ABNORMAL LOW (ref 8.9–10.3)
Chloride: 99 mmol/L (ref 98–111)
Creatinine, Ser: 1.49 mg/dL — ABNORMAL HIGH (ref 0.61–1.24)
GFR, Estimated: 56 mL/min — ABNORMAL LOW
Glucose, Bld: 386 mg/dL — ABNORMAL HIGH (ref 70–99)
Potassium: 4 mmol/L (ref 3.5–5.1)
Sodium: 137 mmol/L (ref 135–145)

## 2024-06-05 LAB — PRO BRAIN NATRIURETIC PEPTIDE: Pro Brain Natriuretic Peptide: 71.2 pg/mL

## 2024-06-05 LAB — TROPONIN T, HIGH SENSITIVITY
Troponin T High Sensitivity: 21 ng/L — ABNORMAL HIGH (ref 0–19)
Troponin T High Sensitivity: 20 ng/L — ABNORMAL HIGH (ref 0–19)

## 2024-06-05 MED ORDER — DIAZEPAM 5 MG PO TABS
5.0000 mg | ORAL_TABLET | Freq: Once | ORAL | Status: AC
  Administered 2024-06-05: 5 mg via ORAL
  Filled 2024-06-05: qty 1

## 2024-06-05 MED ORDER — HYDROMORPHONE HCL 1 MG/ML IJ SOLN
1.0000 mg | Freq: Once | INTRAMUSCULAR | Status: AC
  Administered 2024-06-05: 1 mg via INTRAVENOUS
  Filled 2024-06-05: qty 1

## 2024-06-05 MED ORDER — LACTATED RINGERS IV BOLUS
500.0000 mL | Freq: Once | INTRAVENOUS | Status: AC
  Administered 2024-06-05: 500 mL via INTRAVENOUS

## 2024-06-05 MED ORDER — ONDANSETRON HCL 4 MG/2ML IJ SOLN: 4.0000 mg | Freq: Once | INTRAMUSCULAR | Status: DC

## 2024-06-05 MED ORDER — ONDANSETRON HCL 4 MG/2ML IJ SOLN
4.0000 mg | Freq: Once | INTRAMUSCULAR | Status: AC | PRN
  Administered 2024-06-05: 4 mg via INTRAVENOUS
  Filled 2024-06-05: qty 2

## 2024-06-05 MED ORDER — MORPHINE SULFATE (PF) 4 MG/ML IV SOLN
4.0000 mg | Freq: Once | INTRAVENOUS | Status: AC
  Administered 2024-06-05: 4 mg via INTRAVENOUS
  Filled 2024-06-05: qty 1

## 2024-06-05 MED ORDER — OXYCODONE HCL 5 MG PO TABS
5.0000 mg | ORAL_TABLET | Freq: Once | ORAL | Status: AC
  Administered 2024-06-05: 5 mg via ORAL
  Filled 2024-06-05: qty 1

## 2024-06-05 MED ORDER — ACETAMINOPHEN 500 MG PO TABS
1000.0000 mg | ORAL_TABLET | Freq: Once | ORAL | Status: AC
  Administered 2024-06-05: 1000 mg via ORAL
  Filled 2024-06-05: qty 2

## 2024-06-05 MED ORDER — OXYCODONE-ACETAMINOPHEN 5-325 MG PO TABS: 1.0000 | ORAL_TABLET | Freq: Four times a day (QID) | ORAL | 0 refills | Status: AC | PRN

## 2024-06-05 MED ORDER — AMOXICILLIN-POT CLAVULANATE 875-125 MG PO TABS: 1.0000 | ORAL_TABLET | Freq: Two times a day (BID) | ORAL | 0 refills | Status: AC

## 2024-06-05 MED ORDER — IOHEXOL 350 MG/ML SOLN
150.0000 mL | Freq: Once | INTRAVENOUS | Status: AC | PRN
  Administered 2024-06-05: 150 mL via INTRAVENOUS

## 2024-06-05 MED ORDER — SODIUM CHLORIDE 0.9 % IV BOLUS
500.0000 mL | Freq: Once | INTRAVENOUS | Status: AC
  Administered 2024-06-05: 500 mL via INTRAVENOUS

## 2024-06-05 MED ORDER — KETOROLAC TROMETHAMINE 15 MG/ML IJ SOLN
15.0000 mg | Freq: Once | INTRAMUSCULAR | Status: AC
  Administered 2024-06-05: 15 mg via INTRAVENOUS
  Filled 2024-06-05: qty 1

## 2024-06-05 MED ORDER — AZITHROMYCIN 250 MG PO TABS: 250.0000 mg | ORAL_TABLET | Freq: Every day | ORAL | 0 refills | Status: AC

## 2024-06-05 NOTE — ED Provider Notes (Signed)
 " [place] EMERGENCY DEPARTMENT AT [facility] Provider Note   CSN: [id number] Arrival date & time: 06/05/24  0244     Patient presents with: Shortness of Breath and Respiratory Distress   Carl Hill is a 54 y.o. male.   HPI     This is a 54 year old male with a history of diabetes, chronic venous insufficiency, obstructive sleep apnea on 2 L of oxygen at night who presents with shortness of breath.  Patient was just discharged from the hospital with concerns for acute on chronic cellulitis of the lower extremities.  States that since discharge on Tuesday he has had some increasing shortness of breath.  He normally wears oxygen at night when he lays flat.  He does not have a CPAP.  He is on 2 L.  However, he states that he has not had all the parts to his oxygen concentrator and it has not been working properly.  Upon EMS arrival they states that his O2 sats were in the mid 80s he states that this can be normal for him at night.  He was more short of breath.  No recent fevers or cough.  And route he was placed on CPAP.  He developed some nausea and vomiting.  He vomited once.  They did get the CPAP off prior to him vomiting.  Prior to Admission medications  Medication Sig Start Date End Date Taking? Authorizing Provider  acetaminophen  (TYLENOL ) 500 MG tablet Take 500-1,000 mg by mouth every 6 (six) hours as needed for headache or mild pain (pain score 1-3).    [provider]  albuterol  (VENTOLIN  HFA) 108 (90 Base) MCG/ACT inhaler Inhale 1-2 puffs into the lungs every 6 (six) hours as needed for wheezing or shortness of breath.    [provider]  Calcium  Carbonate-Vit D-Min (CALCIUM  1200 PO) Take 1 tablet by mouth daily.    [provider]  Cholecalciferol  50 MCG (2000 UT) TABS Take 2,000 Units by mouth at bedtime.    [provider]  diltiazem  (CARDIZEM  CD) 120 MG 24 hr capsule Take 120 mg by mouth daily. 04/07/24   [provider]  docusate sodium  (COLACE) 100 MG capsule Take 100 mg by mouth at bedtime.    [provider]  DULoxetine  (CYMBALTA ) 20 MG capsule Take 20 mg by mouth 2 (two) times daily. For neuropathy    [provider]  furosemide  (LASIX ) 20 MG tablet Take 1 tablet (20 mg total) by mouth daily. 05/31/24 06/30/24  Christobal Guadalajara, MD  HUMULIN R  U-500 KWIKPEN 500 UNIT/ML KwikPen Inject 90-100 Units into the skin See admin instructions. Inject 100 Units into the skin 30 (thirty) minutes before breakfast AND 100 Units 30 (thirty) minutes before lunch AND 90 Units 30 (thirty) minutes before supper 04/20/24   [provider]  levothyroxine  (SYNTHROID ) 100 MCG tablet Take 100 mcg by mouth daily. 04/03/20   [provider]  magnesium  oxide (MAG-OX) 400 (240 Mg) MG tablet Take 400 mg by mouth at bedtime.    [provider]  MOUNJARO 10 MG/0.5ML Pen Inject 10 mg into the skin every Sunday. 04/19/24   [provider]  Multiple Vitamins-Minerals (ALIVE MENS ENERGY PO) Take 1 tablet by mouth daily.    [provider]  nortriptyline  (PAMELOR ) 75 MG capsule Take 75 mg by mouth 2 (two) times daily. For neuropathy    [provider]  Nystatin  (GERHARDT'S BUTT CREAM) CREA Apply 1 Application topically 3 (three) times daily.  Patient taking differently: Apply 1 Application topically 3 (three) times daily as needed for irritation. 05/07/24 06/06/24  Ezenduka, Nkeiruka J, MD  Omega 3-6-9 Fatty Acids (OMEGA 3-6-9 PO) Take 1 capsule by mouth at bedtime.    [provider]  omeprazole (PRILOSEC) 20 MG capsule Take 20 mg by mouth 2 (two) times daily. 10/07/20   [provider]  OXYGEN Inhale 2 L into the lungs at bedtime as needed (low O2 sat).    [provider]  polyethylene glycol (MIRALAX  / GLYCOLAX ) 17 g packet Take 17 g by mouth daily. Patient taking differently: Take 17 g by mouth daily as needed for moderate constipation. 09/12/20   Love,  Sharlet RAMAN, PA-C  predniSONE  (DELTASONE ) 5 MG tablet Take 2.5-5 mg by mouth See admin instructions. Take one-half (2.5mg ) tablet by mouth in the morning and one tablet (5mg ) at bedtime    [provider]  pregabalin  (LYRICA ) 200 MG capsule Take 200 mg by mouth 3 (three) times daily.    [provider]  rivaroxaban  (XARELTO ) 20 MG TABS tablet Take 1 tablet (20 mg total) by mouth daily with supper. Patient taking differently: Take 20 mg by mouth in the morning. 09/12/20   Love, Sharlet RAMAN, PA-C  rosuvastatin  (CRESTOR ) 20 MG tablet Take 20 mg by mouth at bedtime. 11/15/23   [provider]  Turmeric (QC TUMERIC COMPLEX PO) Take 2,250 mg by mouth at bedtime. QUNOL TURMERIC with BLACK PEPPER & GINGER 2250mg  per capsule    [provider]    Allergies: Bee venom, Sglt2 inhibitors, and Other    Review of Systems  Constitutional:  Negative for fever.  Respiratory:  Positive for shortness of breath. Negative for cough.   Cardiovascular:  Positive for leg swelling. Negative for chest pain.  Gastrointestinal:  Negative for abdominal pain.  All other systems reviewed and are negative.   Updated Vital Signs BP (!) 151/86   Pulse (!) 105   Temp 98.4 F (36.9 C) (Oral)   Resp 17   SpO2 100%   Physical Exam Vitals and nursing note reviewed.  Constitutional:      Comments: Morbidly obese  HENT:     Head: Normocephalic and atraumatic.  Eyes:     Pupils: Pupils are equal, round, and reactive to light.  Cardiovascular:     Rate and Rhythm: Regular rhythm. Tachycardia present.     Heart sounds: Normal heart sounds. No murmur heard. Pulmonary:     Effort: Pulmonary effort is normal. No respiratory distress.     Breath sounds: Wheezing present.     Comments: Distant breath sound, occasional wheeze left upper, speaking in full sentences, nasal cannula in place Abdominal:     General: Bowel sounds are normal.     Palpations: Abdomen is soft.     Tenderness: There is  no abdominal tenderness. There is no rebound.  Musculoskeletal:     Cervical back: Neck supple.     Right lower leg: Edema present.     Left lower leg: Edema present.     Comments: Bilateral lower extremities wrapped 2+ edema noted bilaterally, chronic venous stasis changes  Lymphadenopathy:     Cervical: No cervical adenopathy.  Skin:    General: Skin is warm and dry.  Neurological:     Mental Status: He is alert and oriented to person, place, and time.  Psychiatric:        Mood and Affect: Mood normal.     (all labs ordered are  listed, but only abnormal results are displayed) Labs Reviewed  CBC WITH DIFFERENTIAL/PLATELET - Abnormal; Notable for the following components:      Result Value   RDW 17.2 (*)    All other components within normal limits  BASIC METABOLIC PANEL WITH GFR - Abnormal; Notable for the following components:   Glucose, Bld 386 (*)    BUN 21 (*)    Creatinine, Ser 1.49 (*)    Calcium  8.7 (*)    GFR, Estimated 56 (*)    All other components within normal limits  TROPONIN T, HIGH SENSITIVITY - Abnormal; Notable for the following components:   Troponin T High Sensitivity 21 (*)    All other components within normal limits  TROPONIN T, HIGH SENSITIVITY - Abnormal; Notable for the following components:   Troponin T High Sensitivity 20 (*)    All other components within normal limits  PRO BRAIN NATRIURETIC PEPTIDE  URINALYSIS, ROUTINE W REFLEX MICROSCOPIC    EKG: EKG Interpretation Date/Time:  Monday June 05 2024 02:49:04 EST Ventricular Rate:  105 PR Interval:  175 QRS Duration:  109 QT Interval:  365 QTC Calculation: 483 R Axis:   -50  Text Interpretation: Sinus tachycardia Abnormal R-wave progression, early transition Inferior infarct, old Baseline wander in lead(s) V2 Confirmed by Bari Pfeiffer ([id number]) on 06/05/2024 3:25:44 AM  Radiology: CT Angio Chest PE W and/or Wo Contrast Result Date: 06/05/2024 CLINICAL DATA:  Shortness of breath.  Respiratory distress. Clinical concern for pulmonary embolus. EXAM: CT ANGIOGRAPHY CHEST WITH CONTRAST TECHNIQUE: Multidetector CT imaging of the chest was performed using the standard protocol during bolus administration of intravenous contrast. Multiplanar CT image reconstructions and MIPs were obtained to evaluate the vascular anatomy. RADIATION DOSE REDUCTION: This exam was performed according to the departmental dose-optimization program which includes automated exposure control, adjustment of the mA and/or kV according to patient size and/or use of iterative reconstruction technique. CONTRAST:  OMNIPAQUE  IOHEXOL  350 MG/ML SOLN COMPARISON:  06/22/2020 FINDINGS: Cardiovascular: The heart size is normal. No substantial pericardial effusion. Mild atherosclerotic calcification is noted in the wall of the thoracic aorta. No large central pulmonary embolus. 1 evaluation of segmental and subsegmental pulmonary arteries markedly degraded by motion and body habitus. Mediastinum/Nodes: Borderline mediastinal lymphadenopathy evident. There is no hilar lymphadenopathy. The esophagus has normal imaging features. There is no axillary lymphadenopathy. Lungs/Pleura: Patchy airspace disease in the left upper lobe. No pleural effusion. Upper Abdomen: Surgical changes are noted in the stomach. Hydroureteronephrosis evident with decreased perfusion to the left kidney. Etiology of the ureteral obstruction is not included on the study. Musculoskeletal: No worrisome lytic or sclerotic osseous abnormality. Review of the MIP images confirms the above findings. IMPRESSION: 1. No large central pulmonary embolus. Evaluation of segmental and subsegmental pulmonary arteries markedly degraded by motion and body habitus. 2. Patchy airspace disease in the left upper lobe is compatible with pneumonia. 3. Hydroureteronephrosis with decreased perfusion to the left kidney. Etiology of the ureteral obstruction is not included on the study.  Patient has a history of urinary stone disease. As such, CT stone study may prove helpful to further evaluate. 4.  Aortic Atherosclerosis (ICD10-I70.0). Electronically Signed   By: Camellia Candle M.D.   On: 06/05/2024 06:45   DG Chest Portable 1 View Result Date: 06/05/2024 EXAM: 1 VIEW(S) XRAY OF THE CHEST 06/05/2024 03:21:41 AM COMPARISON: 05/21/2024 CLINICAL HISTORY: sob sob FINDINGS: LUNGS AND PLEURA: Low lung volumes. No focal pulmonary opacity. No pleural effusion. No pneumothorax. HEART AND MEDIASTINUM: No  acute abnormality of the cardiac and mediastinal silhouettes. BONES AND SOFT TISSUES: No acute osseous abnormality. IMPRESSION: 1. Low lung volumes. 2. No acute process. Electronically signed by: Franky Crease MD 06/05/2024 03:27 AM EST RP Workstation: [id number]     Procedures   Medications Ordered in the ED  morphine  (PF) 4 MG/ML injection 4 mg (has no administration in time range)  lactated ringers  bolus 500 mL (has no administration in time range)  sodium chloride  0.9 % bolus 500 mL (0 mLs Intravenous Stopped 06/05/24 0658)  acetaminophen  (TYLENOL ) tablet 1,000 mg (1,000 mg Oral Given 06/05/24 0537)  diazepam  (VALIUM ) tablet 5 mg (5 mg Oral Given 06/05/24 0537)  iohexol  (OMNIPAQUE ) 350 MG/ML injection 150 mL (150 mLs Intravenous Contrast Given 06/05/24 0634)    Clinical Course as of 06/05/24 0712  Mon Jun 05, 2024  0711 Requested urinalysis from nursing.  Have added CT stone study.  Patient is experiencing some low lateralizing back pain. [CH]    Clinical Course User Index [CH] Demya Scruggs, Charmaine FALCON, MD                                 Medical Decision Making Amount and/or Complexity of Data Reviewed Labs: ordered. Radiology: ordered.  Risk OTC drugs. Prescription drug management.   This patient presents to the ED for concern of shortness of breath, this involves an extensive number of treatment options, and is a complaint that carries with it a high risk of complications and  morbidity.  I considered the following differential and admission for this acute, potentially life threatening condition.  The differential diagnosis includes ACS, PE, pneumothorax, pneumonia, volume overload  MDM:    This is a 54 year old male who initially presents with shortness of breath.  He is nontoxic.  Chronically ill-appearing.  Reports difficulties with his oxygen concentrator at home.  Does wear baseline O2 at night.  He was transition to his home O2 requirement and has done well in the emergency department.  Exam is limited secondary to body habitus.  Chest x-ray does not show any acute abnormalities.  Labs notable for slight AKI.  May be related to dehydration.  Patient did complain of some back pain while here.  Will obtain CT PE study to rule out PE given history of chronic lower extremity swelling as well.  Troponin is flat at 20.  CT PE study does not show evidence of PE but does show concerns for possible kidney stone.  This may be the culprit of the patient's back pain.  He also had an episode of emesis.  He was given fluids prior to CT.  Will send back over for a noncontrasted study and treat supportively.  Patient signed out to oncoming provider.  Urinalysis still pending.  (Labs, imaging, consults)  Labs: I Ordered, and personally interpreted labs.  The pertinent results include: CBC, BMP, BNP, troponin, urinalysis  Imaging Studies ordered: I ordered imaging studies including x-ray, CT I independently visualized and interpreted imaging. I agree with the radiologist interpretation  Additional history obtained from chart review.  External records from outside source obtained and reviewed including prior evaluations  Cardiac Monitoring: The patient was maintained on a cardiac monitor.  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   Asthma    Atrial fibrillation (HCC)    Diabetes mellitus without complication (HCC)    Fournier gangrene (HCC) 06/10/2020   History of cardioversion    3 times    Hypertension    Orthostatic hypotension 03/07/2021   Sepsis due to Streptococcus, group B (HCC) 06/22/2020     Medicines Meds ordered this encounter  Medications   sodium chloride  0.9 % bolus 500 mL   acetaminophen  (TYLENOL ) tablet 1,000 mg   diazepam  (VALIUM ) tablet 5 mg   iohexol  (OMNIPAQUE ) 350 MG/ML injection 150 mL   morphine  (PF) 4 MG/ML injection 4 mg   lactated ringers  bolus 500 mL    I have reviewed the patients home medicines and have made adjustments as needed  Problem List / ED Course: Problem List Items Addressed This Visit   None Visit Diagnoses       Hyperglycemia    -  Primary     SOB (shortness of breath)                    Final diagnoses:  Hyperglycemia  SOB (shortness of breath)    ED Discharge Orders     None          Bari Charmaine FALCON, MD 06/05/24 0715  "

## 2024-06-05 NOTE — ED Notes (Signed)
 [facility] called ETA 30 mins -1hr

## 2024-06-05 NOTE — ED Provider Notes (Signed)
 Care transferred to me.  Urine is unremarkable.  After treatment in the ED his pain is a lot better.  He is also had an In-N-Out cath that had over 1000 mL out.  He states he always has a very difficult time urinating in the hospital, especially the ER.  However he feels confident he can urinate at home and does not want a catheter.  No signs of infection.  Does have what is probably a ureteral stone on CT imaging.  He is comfortable appearing and feels well enough for home.  CT did show some pneumonia and he does endorse some cough for about a week so we will start antibiotics.  Appears stable for discharge home with pain control.   Freddi Hamilton, MD 06/05/24 1054

## 2024-06-05 NOTE — Discharge Instructions (Signed)
 Your workup today shows that you have pneumonia, you are being treated with 2 different antibiotics for this.  You also have a kidney stone that is in your left ureter.  We are treating you with oxycodone  for pain.  You may also take Tylenol  and ibuprofen.  Be sure to drink plenty of fluids.  Follow-up with your primary care provider in regards to the pneumonia.  You will need to follow-up with urology in regards to the kidney stone.  Return to the ER for any new or worsening symptoms.

## 2024-06-05 NOTE — ED Triage Notes (Addendum)
 Patient presents from home, feeling unwell. Patient was discharged from the hospital on Tuesday for weeping legs. Patient reports after that he had cough and hard breathing. Patient reports his oxygen concentrator was not working properly. Patient was 80% on scene. Patient was given 4mg  of zofran  in route, however vomited when pulling up to the hospital. Patient arrives on CPAP.
